# Patient Record
Sex: Female | Born: 1955 | Race: White | Hispanic: No | Marital: Married | Smoking: Former smoker
Health system: Southern US, Community
[De-identification: ages and names within clinical notes are randomized; demographics above are authoritative.]

## PROBLEM LIST (undated history)

## (undated) DIAGNOSIS — E119 Type 2 diabetes mellitus without complications: Secondary | ICD-10-CM

## (undated) DIAGNOSIS — E079 Disorder of thyroid, unspecified: Secondary | ICD-10-CM

## (undated) DIAGNOSIS — I639 Cerebral infarction, unspecified: Secondary | ICD-10-CM

## (undated) DIAGNOSIS — I10 Essential (primary) hypertension: Secondary | ICD-10-CM

## (undated) DIAGNOSIS — I2510 Atherosclerotic heart disease of native coronary artery without angina pectoris: Secondary | ICD-10-CM

## (undated) DIAGNOSIS — N289 Disorder of kidney and ureter, unspecified: Secondary | ICD-10-CM

## (undated) DIAGNOSIS — I219 Acute myocardial infarction, unspecified: Secondary | ICD-10-CM

## (undated) DIAGNOSIS — E7800 Pure hypercholesterolemia, unspecified: Secondary | ICD-10-CM

## (undated) DIAGNOSIS — E039 Hypothyroidism, unspecified: Secondary | ICD-10-CM

## (undated) HISTORY — PX: CARPAL TUNNEL RELEASE: SHX101

## (undated) HISTORY — PX: CORONARY ANGIOPLASTY: SHX604

## (undated) HISTORY — PX: ABDOMINAL HYSTERECTOMY: SHX81

---
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
## 1998-01-20 ENCOUNTER — Inpatient Hospital Stay (HOSPITAL_COMMUNITY): Admission: RE | Admit: 1998-01-20 | Discharge: 1998-01-23 | Payer: Self-pay | Admitting: Obstetrics and Gynecology

## 1998-02-02 ENCOUNTER — Inpatient Hospital Stay (HOSPITAL_COMMUNITY): Admission: AD | Admit: 1998-02-02 | Discharge: 1998-02-07 | Payer: Self-pay | Admitting: Obstetrics and Gynecology

## 1998-02-03 ENCOUNTER — Encounter: Payer: Self-pay | Admitting: General Surgery

## 1999-07-27 ENCOUNTER — Encounter: Payer: Self-pay | Admitting: Family Medicine

## 2000-03-23 ENCOUNTER — Encounter: Payer: Self-pay | Admitting: Emergency Medicine

## 2000-03-23 ENCOUNTER — Emergency Department (HOSPITAL_COMMUNITY): Admission: EM | Admit: 2000-03-23 | Discharge: 2000-03-23 | Payer: Self-pay | Admitting: Emergency Medicine

## 2000-03-28 ENCOUNTER — Encounter: Payer: Self-pay | Admitting: Family Medicine

## 2000-03-28 ENCOUNTER — Encounter: Admission: RE | Admit: 2000-03-28 | Discharge: 2000-03-28 | Payer: Self-pay | Admitting: Family Medicine

## 2000-05-19 ENCOUNTER — Other Ambulatory Visit: Admission: RE | Admit: 2000-05-19 | Discharge: 2000-05-19 | Payer: Self-pay | Admitting: Family Medicine

## 2000-06-25 ENCOUNTER — Emergency Department (HOSPITAL_COMMUNITY): Admission: EM | Admit: 2000-06-25 | Discharge: 2000-06-26 | Payer: Self-pay | Admitting: Emergency Medicine

## 2000-06-25 ENCOUNTER — Encounter: Payer: Self-pay | Admitting: Emergency Medicine

## 2003-01-13 ENCOUNTER — Encounter: Admission: RE | Admit: 2003-01-13 | Discharge: 2003-04-13 | Payer: Self-pay | Admitting: *Deleted

## 2003-01-15 ENCOUNTER — Encounter: Admission: RE | Admit: 2003-01-15 | Discharge: 2003-01-15 | Payer: Self-pay | Admitting: *Deleted

## 2004-04-21 ENCOUNTER — Ambulatory Visit: Payer: Self-pay | Admitting: Family Medicine

## 2004-09-27 ENCOUNTER — Emergency Department (HOSPITAL_COMMUNITY): Admission: EM | Admit: 2004-09-27 | Discharge: 2004-09-28 | Payer: Self-pay | Admitting: Emergency Medicine

## 2004-10-01 ENCOUNTER — Ambulatory Visit: Payer: Self-pay | Admitting: Family Medicine

## 2004-10-11 ENCOUNTER — Encounter: Admission: RE | Admit: 2004-10-11 | Discharge: 2004-10-11 | Payer: Self-pay | Admitting: Family Medicine

## 2005-03-07 ENCOUNTER — Ambulatory Visit: Payer: Self-pay | Admitting: Family Medicine

## 2005-04-11 ENCOUNTER — Ambulatory Visit: Payer: Self-pay | Admitting: Family Medicine

## 2005-12-27 ENCOUNTER — Ambulatory Visit: Payer: Self-pay | Admitting: Family Medicine

## 2005-12-28 ENCOUNTER — Encounter: Payer: Self-pay | Admitting: Family Medicine

## 2005-12-28 LAB — CONVERTED CEMR LAB: Hgb A1c MFr Bld: 8.8 %

## 2006-01-10 ENCOUNTER — Ambulatory Visit: Payer: Self-pay | Admitting: Family Medicine

## 2006-01-24 ENCOUNTER — Ambulatory Visit: Payer: Self-pay | Admitting: Family Medicine

## 2006-02-16 ENCOUNTER — Ambulatory Visit: Payer: Self-pay | Admitting: Family Medicine

## 2006-09-21 ENCOUNTER — Encounter: Payer: Self-pay | Admitting: Family Medicine

## 2006-09-21 DIAGNOSIS — F411 Generalized anxiety disorder: Secondary | ICD-10-CM | POA: Insufficient documentation

## 2006-09-21 DIAGNOSIS — E113512 Type 2 diabetes mellitus with proliferative diabetic retinopathy with macular edema, left eye: Secondary | ICD-10-CM | POA: Insufficient documentation

## 2006-09-21 DIAGNOSIS — F419 Anxiety disorder, unspecified: Secondary | ICD-10-CM | POA: Insufficient documentation

## 2006-09-21 DIAGNOSIS — Z87898 Personal history of other specified conditions: Secondary | ICD-10-CM | POA: Insufficient documentation

## 2006-09-21 DIAGNOSIS — J449 Chronic obstructive pulmonary disease, unspecified: Secondary | ICD-10-CM | POA: Insufficient documentation

## 2006-09-21 DIAGNOSIS — R609 Edema, unspecified: Secondary | ICD-10-CM | POA: Insufficient documentation

## 2006-09-21 DIAGNOSIS — F4323 Adjustment disorder with mixed anxiety and depressed mood: Secondary | ICD-10-CM | POA: Insufficient documentation

## 2006-09-21 DIAGNOSIS — E11319 Type 2 diabetes mellitus with unspecified diabetic retinopathy without macular edema: Secondary | ICD-10-CM

## 2006-09-21 DIAGNOSIS — G5600 Carpal tunnel syndrome, unspecified upper limb: Secondary | ICD-10-CM | POA: Insufficient documentation

## 2006-09-21 DIAGNOSIS — E1142 Type 2 diabetes mellitus with diabetic polyneuropathy: Secondary | ICD-10-CM | POA: Insufficient documentation

## 2006-09-21 DIAGNOSIS — E669 Obesity, unspecified: Secondary | ICD-10-CM | POA: Insufficient documentation

## 2006-09-21 DIAGNOSIS — Z794 Long term (current) use of insulin: Secondary | ICD-10-CM

## 2006-09-21 DIAGNOSIS — F17200 Nicotine dependence, unspecified, uncomplicated: Secondary | ICD-10-CM | POA: Insufficient documentation

## 2006-10-03 ENCOUNTER — Ambulatory Visit: Payer: Self-pay | Admitting: Family Medicine

## 2006-10-03 LAB — CONVERTED CEMR LAB
ALT: 15 units/L (ref 0–35)
CO2: 27 meq/L (ref 19–32)
Calcium: 9.4 mg/dL (ref 8.4–10.5)
Phosphorus: 4.2 mg/dL (ref 2.3–4.6)
Potassium: 4.9 meq/L (ref 3.5–5.3)
Sodium: 143 meq/L (ref 135–145)
TSH: 4.565 microintl units/mL (ref 0.350–5.50)

## 2006-10-19 ENCOUNTER — Encounter: Payer: Self-pay | Admitting: Family Medicine

## 2006-11-07 ENCOUNTER — Encounter (INDEPENDENT_AMBULATORY_CARE_PROVIDER_SITE_OTHER): Payer: Self-pay | Admitting: *Deleted

## 2007-01-16 ENCOUNTER — Ambulatory Visit: Payer: Self-pay

## 2007-03-19 ENCOUNTER — Encounter: Admission: RE | Admit: 2007-03-19 | Discharge: 2007-03-19 | Payer: Self-pay | Admitting: Orthopedic Surgery

## 2007-04-10 ENCOUNTER — Encounter: Admission: RE | Admit: 2007-04-10 | Discharge: 2007-04-10 | Payer: Self-pay | Admitting: Orthopedic Surgery

## 2007-09-18 ENCOUNTER — Encounter (INDEPENDENT_AMBULATORY_CARE_PROVIDER_SITE_OTHER): Payer: Self-pay | Admitting: *Deleted

## 2007-10-12 ENCOUNTER — Ambulatory Visit: Payer: Self-pay | Admitting: Family Medicine

## 2007-10-14 LAB — CONVERTED CEMR LAB
AST: 14 units/L (ref 0–37)
Alkaline Phosphatase: 75 units/L (ref 39–117)
Bilirubin, Direct: 0.1 mg/dL (ref 0.0–0.3)
Indirect Bilirubin: 0.3 mg/dL (ref 0.0–0.9)
Total Bilirubin: 0.4 mg/dL (ref 0.3–1.2)

## 2007-10-15 LAB — CONVERTED CEMR LAB
BUN: 12 mg/dL (ref 6–23)
CO2: 27 meq/L (ref 19–32)
Calcium: 9.1 mg/dL (ref 8.4–10.5)
Chloride: 100 meq/L (ref 96–112)
Eosinophils Absolute: 0.1 10*3/uL (ref 0.0–0.7)
Eosinophils Relative: 2 % (ref 0–5)
Glucose, Bld: 288 mg/dL — ABNORMAL HIGH (ref 70–99)
Hgb A1c MFr Bld: 10.8 % — ABNORMAL HIGH (ref 4.6–6.1)
Lymphocytes Relative: 24 % (ref 12–46)
Lymphs Abs: 1.7 10*3/uL (ref 0.7–4.0)
MCHC: 31.7 g/dL (ref 30.0–36.0)
MCV: 86.1 fL (ref 78.0–100.0)
Monocytes Relative: 6 % (ref 3–12)
Neutrophils Relative %: 68 % (ref 43–77)
Phosphorus: 3.4 mg/dL (ref 2.3–4.6)
Platelets: 244 10*3/uL (ref 150–400)
Potassium: 3.7 meq/L (ref 3.5–5.3)
RBC: 5.09 M/uL (ref 3.87–5.11)
RDW: 14.3 % (ref 11.5–15.5)
Sodium: 138 meq/L (ref 135–145)
Triglycerides: 321 mg/dL — ABNORMAL HIGH (ref <150)
VLDL: 64 mg/dL — ABNORMAL HIGH (ref 0–40)
WBC: 7.2 10*3/uL (ref 4.0–10.5)

## 2007-10-17 ENCOUNTER — Encounter: Payer: Self-pay | Admitting: Family Medicine

## 2007-10-26 ENCOUNTER — Telehealth: Payer: Self-pay | Admitting: Family Medicine

## 2007-11-06 ENCOUNTER — Ambulatory Visit: Payer: Self-pay | Admitting: Professional

## 2007-11-13 ENCOUNTER — Ambulatory Visit: Payer: Self-pay | Admitting: Family Medicine

## 2007-11-13 DIAGNOSIS — I10 Essential (primary) hypertension: Secondary | ICD-10-CM | POA: Insufficient documentation

## 2008-05-27 ENCOUNTER — Encounter: Payer: Self-pay | Admitting: Family Medicine

## 2008-07-25 ENCOUNTER — Telehealth: Payer: Self-pay | Admitting: Family Medicine

## 2008-07-30 ENCOUNTER — Ambulatory Visit: Payer: Self-pay | Admitting: Family Medicine

## 2008-08-01 ENCOUNTER — Encounter (INDEPENDENT_AMBULATORY_CARE_PROVIDER_SITE_OTHER): Payer: Self-pay | Admitting: Internal Medicine

## 2008-08-01 LAB — CONVERTED CEMR LAB
AST: 15 units/L (ref 0–37)
Cholesterol: 233 mg/dL (ref 0–200)
Creatinine, Ser: 0.8 mg/dL (ref 0.4–1.2)
Direct LDL: 159.5 mg/dL
Glucose, Bld: 151 mg/dL — ABNORMAL HIGH (ref 70–99)
HDL: 41 mg/dL (ref >39.0)
Hgb A1c MFr Bld: 8.1 % — ABNORMAL HIGH (ref 4.6–6.0)
Microalb Creat Ratio: 36.5 mg/g — ABNORMAL HIGH (ref 0.0–30.0)
Microalb, Ur: 1.5 mg/dL (ref 0.0–1.9)
Sodium: 139 meq/L (ref 135–145)
TSH: 4.94 microintl units/mL (ref 0.35–5.50)
Triglycerides: 121 mg/dL (ref 0–149)

## 2008-08-28 ENCOUNTER — Ambulatory Visit: Payer: Self-pay | Admitting: Family Medicine

## 2008-08-28 DIAGNOSIS — E785 Hyperlipidemia, unspecified: Secondary | ICD-10-CM | POA: Insufficient documentation

## 2008-08-28 DIAGNOSIS — E1169 Type 2 diabetes mellitus with other specified complication: Secondary | ICD-10-CM | POA: Insufficient documentation

## 2008-08-29 LAB — CONVERTED CEMR LAB
AST: 13 units/L (ref 0–37)
BUN: 12 mg/dL (ref 6–23)
CO2: 31 meq/L (ref 19–32)
Calcium: 9 mg/dL (ref 8.4–10.5)
Creatinine, Ser: 0.8 mg/dL (ref 0.4–1.2)
Direct LDL: 114.8 mg/dL
Glucose, Bld: 189 mg/dL — ABNORMAL HIGH (ref 70–99)
Hgb A1c MFr Bld: 8.2 % — ABNORMAL HIGH (ref 4.6–6.5)
Potassium: 4.4 meq/L (ref 3.5–5.1)
Sodium: 140 meq/L (ref 135–145)
VLDL: 40.4 mg/dL — ABNORMAL HIGH (ref 0.0–40.0)

## 2008-10-28 ENCOUNTER — Ambulatory Visit: Payer: Self-pay | Admitting: Family Medicine

## 2008-10-28 ENCOUNTER — Telehealth: Payer: Self-pay | Admitting: Family Medicine

## 2008-10-30 ENCOUNTER — Encounter (INDEPENDENT_AMBULATORY_CARE_PROVIDER_SITE_OTHER): Payer: Self-pay | Admitting: Internal Medicine

## 2008-10-30 LAB — CONVERTED CEMR LAB
AST: 16 units/L (ref 0–37)
Cholesterol: 164 mg/dL (ref 0–200)
HDL: 38.9 mg/dL — ABNORMAL LOW (ref >39.00)
Total CHOL/HDL Ratio: 4
VLDL: 28.2 mg/dL (ref 0.0–40.0)

## 2008-12-24 ENCOUNTER — Telehealth: Payer: Self-pay | Admitting: Family Medicine

## 2009-01-26 ENCOUNTER — Ambulatory Visit: Payer: Self-pay | Admitting: Family Medicine

## 2009-01-27 LAB — CONVERTED CEMR LAB
AST: 15 units/L (ref 0–37)
BUN: 8 mg/dL (ref 6–23)
Chloride: 105 meq/L (ref 96–112)
HDL: 41.9 mg/dL (ref >39.00)
Hgb A1c MFr Bld: 12.5 % — ABNORMAL HIGH (ref 4.6–6.5)
LDL Cholesterol: 102 mg/dL — ABNORMAL HIGH (ref 0–99)
Phosphorus: 3.8 mg/dL (ref 2.3–4.6)
Sodium: 141 meq/L (ref 135–145)
VLDL: 26 mg/dL (ref 0.0–40.0)

## 2009-03-04 ENCOUNTER — Encounter: Payer: Self-pay | Admitting: Family Medicine

## 2009-05-17 ENCOUNTER — Emergency Department (HOSPITAL_COMMUNITY): Admission: EM | Admit: 2009-05-17 | Discharge: 2009-05-17 | Payer: Self-pay | Admitting: Emergency Medicine

## 2009-05-22 ENCOUNTER — Ambulatory Visit: Payer: Self-pay | Admitting: Family Medicine

## 2009-06-03 ENCOUNTER — Telehealth: Payer: Self-pay | Admitting: Family Medicine

## 2009-07-27 ENCOUNTER — Encounter: Payer: Self-pay | Admitting: Family Medicine

## 2009-11-05 ENCOUNTER — Ambulatory Visit: Payer: Self-pay | Admitting: Family Medicine

## 2009-11-05 LAB — CONVERTED CEMR LAB: Rapid Strep: NEGATIVE

## 2009-11-06 ENCOUNTER — Emergency Department (HOSPITAL_COMMUNITY): Admission: EM | Admit: 2009-11-06 | Discharge: 2009-11-06 | Payer: Self-pay | Admitting: Emergency Medicine

## 2009-11-12 ENCOUNTER — Telehealth: Payer: Self-pay | Admitting: Family Medicine

## 2010-01-21 ENCOUNTER — Ambulatory Visit: Payer: Self-pay | Admitting: Family Medicine

## 2010-02-16 ENCOUNTER — Telehealth: Payer: Self-pay | Admitting: Family Medicine

## 2010-04-05 ENCOUNTER — Telehealth: Payer: Self-pay | Admitting: Family Medicine

## 2010-07-06 ENCOUNTER — Other Ambulatory Visit: Payer: Self-pay | Admitting: Family Medicine

## 2010-07-06 ENCOUNTER — Ambulatory Visit
Admission: RE | Admit: 2010-07-06 | Discharge: 2010-07-06 | Payer: Self-pay | Source: Home / Self Care | Attending: Family Medicine | Admitting: Family Medicine

## 2010-07-06 DIAGNOSIS — N63 Unspecified lump in unspecified breast: Secondary | ICD-10-CM | POA: Insufficient documentation

## 2010-07-06 DIAGNOSIS — E039 Hypothyroidism, unspecified: Secondary | ICD-10-CM | POA: Insufficient documentation

## 2010-07-06 DIAGNOSIS — N6310 Unspecified lump in the right breast, unspecified quadrant: Secondary | ICD-10-CM

## 2010-07-06 LAB — CBC WITH DIFFERENTIAL/PLATELET
Basophils Absolute: 0 10*3/uL (ref 0.0–0.1)
Basophils Relative: 0.3 % (ref 0.0–3.0)
Eosinophils Absolute: 0.1 10*3/uL (ref 0.0–0.7)
HCT: 43 % (ref 36.0–46.0)
Lymphocytes Relative: 25.9 % (ref 12.0–46.0)
Lymphs Abs: 2.8 10*3/uL (ref 0.7–4.0)
Monocytes Absolute: 0.6 10*3/uL (ref 0.1–1.0)
Monocytes Relative: 5.4 % (ref 3.0–12.0)
Neutro Abs: 7.2 10*3/uL (ref 1.4–7.7)
RDW: 14.5 % (ref 11.5–14.6)
WBC: 10.7 10*3/uL — ABNORMAL HIGH (ref 4.5–10.5)

## 2010-07-06 LAB — HEPATIC FUNCTION PANEL
AST: 16 U/L (ref 0–37)
Albumin: 3.9 g/dL (ref 3.5–5.2)
Bilirubin, Direct: 0.1 mg/dL (ref 0.0–0.3)
Total Bilirubin: 0.7 mg/dL (ref 0.3–1.2)

## 2010-07-06 LAB — RENAL FUNCTION PANEL
Albumin: 3.9 g/dL (ref 3.5–5.2)
BUN: 12 mg/dL (ref 6–23)
Calcium: 9.6 mg/dL (ref 8.4–10.5)
Glucose, Bld: 149 mg/dL — ABNORMAL HIGH (ref 70–99)
Phosphorus: 4.2 mg/dL (ref 2.3–4.6)
Sodium: 140 mEq/L (ref 135–145)

## 2010-07-06 LAB — LIPID PANEL
Cholesterol: 212 mg/dL — ABNORMAL HIGH (ref 0–200)
Total CHOL/HDL Ratio: 4
VLDL: 22.6 mg/dL (ref 0.0–40.0)

## 2010-07-06 LAB — TSH: TSH: 3.75 u[IU]/mL (ref 0.35–5.50)

## 2010-07-13 ENCOUNTER — Encounter (INDEPENDENT_AMBULATORY_CARE_PROVIDER_SITE_OTHER): Payer: Self-pay | Admitting: *Deleted

## 2010-07-13 NOTE — Progress Notes (Signed)
Summary: Bupropion Refill  Phone Note Refill Request Call back at [place] from:  [facility] on February 16, 2010 4:46 PM  Refills Requested: Medication #1:  BUDEPRION SR 150 MG TB12 1 by mouth two times a day   Last Refilled: 01/09/2010 OK to refill 1 by mouth two times a day #60 with 3 refills?   Method Requested: Electronic Initial call taken by: Arnetha Courser CMA Deborra Medina),  February 16, 2010 4:47 PM  Follow-up for Phone Call        px written on EMR for call in  Follow-up by: Allena Earing MD,  February 16, 2010 5:09 PM  Additional Follow-up for Phone Call Additional follow up Details #1::        Medication phoned [facility] as instructed. Ozzie Hoyle LPN  September  6, 624THL 5:15 PM     New/Updated Medications: BUDEPRION SR 150 MG TB12 (BUPROPION HCL) 1 by mouth two times a day Prescriptions: BUDEPRION SR 150 MG TB12 (BUPROPION HCL) 1 by mouth two times a day  #60 x 5   Entered and Authorized by:   Allena Earing MD   Signed by:   Ozzie Hoyle LPN on 624THL   Method used:   Telephoned to ...       [facility] ([facility])       [address] [place], [place]  [address]       Ph: [phone]       Fax: [phone]   RxID:   [id number]
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
## 2010-07-13 NOTE — Progress Notes (Signed)
Summary: Cymbalta 60mg  and Furosemide 20mg   Phone Note Refill Request Call back at [phone] Message from:  [facility] on April 05, 2010 11:19 AM  Refills Requested: Medication #1:  CYMBALTA 60 MG CPEP 1 once daily for depression   Last Refilled: 02/15/2010  Medication #2:  LASIX 20 MG  TABS 2 by mouth daily   Last Refilled: 02/15/2010 [facility] electronically request refill on Cymbalta 60mg  and Furosemide 20mg . Last refill date on both was 02/15/10. does pt need f/u appt for HTN?Please advise.    Method Requested: Electronic Initial call taken by: Ozzie Hoyle LPN,  October 24, 624THL 11:20 AM  Follow-up for Phone Call        please sched f/u for DM and HTN - is overdue px written on EMR for call in  Follow-up by: Allena Earing MD,  April 05, 2010 11:26 AM  Additional Follow-up for Phone Call Additional follow up Details #1::        Patient Advised.  She will schedule appt. once she correlates it with her work schedule. Additional Follow-up by: Christena Deem CMA Deborra Medina),  April 05, 2010 12:34 PM    New/Updated Medications: LASIX 20 MG  TABS (FUROSEMIDE) 2 by mouth once daily CYMBALTA 60 MG CPEP (DULOXETINE HCL) 1 once daily for depression Prescriptions: LASIX 20 MG  TABS (FUROSEMIDE) 2 by mouth once daily  #60 x 3   Entered and Authorized by:   Allena Earing MD   Signed by:   Christena Deem CMA ([place]) on 04/05/2010   Method used:   Telephoned to ...       [facility]       [place], [place]  [address]       Ph: [phone]       Fax: [phone]   RxID:   [id number] CYMBALTA 60 MG CPEP (DULOXETINE HCL) 1 once daily for depression  #30 x 11   Entered and Authorized by:   Allena Earing MD   Signed by:   Christena Deem CMA ([place]) on 04/05/2010   Method used:   Telephoned to ...       [facility]       [address] [place], [place]  [address]       Ph:  [phone]       Fax: [phone]   RxID:   [id number]
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
## 2010-07-13 NOTE — Progress Notes (Signed)
Summary: requests diflucan  Phone Note Call from Patient Call back at Home Phone [phone]   Caller: Patient Call For: Allena Earing MD Summary of Call: Pt has recently been on antibiotics and now she has a yeast infection- itching and discharge.  She is asking for diflucan, [facility] [place]. Initial call taken by: Marty Heck CMA,  November 12, 2009 2:04 PM  Follow-up for Phone Call        px written on EMR for call in  Follow-up by: Allena Earing MD,  November 12, 2009 3:22 PM  Additional Follow-up for Phone Call Additional follow up Details #1::        Medication phoned to [facility] as instructed. Patient notified as instructed by telephone. Ozzie Hoyle LPN  June  2, 624THL 624THL PM     New/Updated Medications: DIFLUCAN 150 MG TABS (FLUCONAZOLE) 1 by mouth times one for yeast Prescriptions: DIFLUCAN 150 MG TABS (FLUCONAZOLE) 1 by mouth times one for yeast  #1 x 0   Entered and Authorized by:   Allena Earing MD   Signed by:   Ozzie Hoyle LPN on 075-GRM   Method used:   Telephoned to ...       [facility]       [address] [place], [place]  [address]       Ph: [phone]       Fax: [phone]   RxID:   [id number]

## 2010-07-14 ENCOUNTER — Encounter (HOSPITAL_COMMUNITY): Payer: Self-pay

## 2010-07-14 ENCOUNTER — Ambulatory Visit (HOSPITAL_COMMUNITY)
Admission: RE | Admit: 2010-07-14 | Discharge: 2010-07-14 | Disposition: A | Discharge status: Home / Self Care | Payer: 59 | Source: Ambulatory Visit | Attending: Family Medicine | Admitting: Family Medicine

## 2010-07-14 ENCOUNTER — Encounter: Payer: Self-pay | Admitting: Family Medicine

## 2010-07-14 DIAGNOSIS — N63 Unspecified lump in unspecified breast: Secondary | ICD-10-CM | POA: Insufficient documentation

## 2010-07-14 DIAGNOSIS — N6009 Solitary cyst of unspecified breast: Secondary | ICD-10-CM | POA: Insufficient documentation

## 2010-07-14 DIAGNOSIS — N6310 Unspecified lump in the right breast, unspecified quadrant: Secondary | ICD-10-CM
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
## 2010-07-16 NOTE — Letter (Signed)
Summary: [facility]   Imported By: Edmonia James 08/12/2009 11:08:44  _____________________________________________________________________  External Attachment:    Type:   Image     Comment:   External Document

## 2010-07-21 NOTE — Miscellaneous (Signed)
Summary: accucheck test strips  Medications Added ACCU-CHEK AVIVA  STRP (GLUCOSE BLOOD) check blood glucose twice daily and as needed for poor diabetes control       Clinical Lists Changes  Medications: Changed medication from * DIABETES TEST STRIPS - ACCUCHECK, AND LANCETS to check glucose two times a day and as needed for poorly controlled DM 250.0 to ACCU-CHEK AVIVA  STRP (GLUCOSE BLOOD) check blood glucose twice daily and as needed for poor diabetes control     Prior Medications: LASIX 20 MG  TABS (FUROSEMIDE) 2 by mouth once daily BUDEPRION SR 150 MG TB12 (BUPROPION HCL) 1 by mouth two times a day LISINOPRIL 5 MG  TABS (LISINOPRIL) 1 by mouth every am CYMBALTA 60 MG CPEP (DULOXETINE HCL) 1 once daily for depression ACCU-CHEK AVIVA  STRP (GLUCOSE BLOOD) check blood glucose twice daily and as needed for poor diabetes control ZOCOR 80 MG TABS (SIMVASTATIN) 1 by mouth once daily LEVOTHYROXINE SODIUM 50 MCG TABS (LEVOTHYROXINE SODIUM) Take 1 tablet by mouth once a day HUMULIN 70/30 70-30 % SUSP (INSULIN ISOPHANE & REGULAR) Inject 60 units before breakfast and 60 units before bedtime Current Allergies: No known allergies

## 2010-08-16 ENCOUNTER — Other Ambulatory Visit (INDEPENDENT_AMBULATORY_CARE_PROVIDER_SITE_OTHER): Payer: 59

## 2010-08-16 ENCOUNTER — Other Ambulatory Visit: Payer: Self-pay | Admitting: Family Medicine

## 2010-08-16 ENCOUNTER — Encounter (INDEPENDENT_AMBULATORY_CARE_PROVIDER_SITE_OTHER): Payer: Self-pay | Admitting: *Deleted

## 2010-08-16 DIAGNOSIS — D72829 Elevated white blood cell count, unspecified: Secondary | ICD-10-CM | POA: Insufficient documentation

## 2010-08-16 DIAGNOSIS — E785 Hyperlipidemia, unspecified: Secondary | ICD-10-CM

## 2010-08-16 LAB — CBC WITH DIFFERENTIAL/PLATELET
Basophils Absolute: 0 10*3/uL (ref 0.0–0.1)
Basophils Relative: 0.3 % (ref 0.0–3.0)
Eosinophils Absolute: 0.1 10*3/uL (ref 0.0–0.7)
Eosinophils Relative: 1 % (ref 0.0–5.0)
Lymphs Abs: 2.5 10*3/uL (ref 0.7–4.0)
MCV: 83.8 fl (ref 78.0–100.0)
Monocytes Relative: 5.7 % (ref 3.0–12.0)
Neutro Abs: 7 10*3/uL (ref 1.4–7.7)
RBC: 4.85 Mil/uL (ref 3.87–5.11)
WBC: 10.2 10*3/uL (ref 4.5–10.5)

## 2010-08-16 LAB — AST: AST: 13 U/L (ref 0–37)

## 2010-08-16 LAB — LIPID PANEL
Total CHOL/HDL Ratio: 5
Triglycerides: 222 mg/dL — ABNORMAL HIGH (ref 0.0–149.0)
VLDL: 44.4 mg/dL — ABNORMAL HIGH (ref 0.0–40.0)

## 2010-08-16 LAB — LDL CHOLESTEROL, DIRECT: Direct LDL: 119.1 mg/dL

## 2010-10-11 ENCOUNTER — Telehealth: Payer: Self-pay | Admitting: *Deleted

## 2010-10-11 MED ORDER — FLUCONAZOLE 150 MG PO TABS: 150.0000 mg | ORAL_TABLET | Freq: Once | ORAL | Status: AC

## 2010-10-11 NOTE — Telephone Encounter (Signed)
Pt is complaining of yeast infection- has itching, burning, discharge.  Would like diflucan sent to [facility] in [place].

## 2010-10-11 NOTE — Telephone Encounter (Signed)
Px written for call in  F/u if not improved

## 2010-10-11 NOTE — Telephone Encounter (Signed)
Patient notified as instructed by telephone. Medication phoned to [place] pharmacy as instructed.

## 2010-11-20 ENCOUNTER — Other Ambulatory Visit: Payer: Self-pay | Admitting: Family Medicine

## 2010-11-21 NOTE — Telephone Encounter (Signed)
Px written for call in   

## 2010-11-22 NOTE — Telephone Encounter (Signed)
Rx called to [facility]. 

## 2010-12-31 ENCOUNTER — Other Ambulatory Visit: Payer: Self-pay | Admitting: Family Medicine

## 2010-12-31 NOTE — Telephone Encounter (Signed)
[facility] [place] electrnically request Lisinopril 5mg  #30 given x0 pt needs to call for appt.

## 2011-02-12 ENCOUNTER — Other Ambulatory Visit: Payer: Self-pay | Admitting: Family Medicine

## 2011-03-21 ENCOUNTER — Telehealth: Payer: Self-pay | Admitting: *Deleted

## 2011-03-21 MED ORDER — FLUCONAZOLE 150 MG PO TABS: 150.0000 mg | ORAL_TABLET | Freq: Once | ORAL | Status: AC

## 2011-03-21 NOTE — Telephone Encounter (Signed)
Pt states she has a yeast infection- has itching and burning, and is requesting diflucan.  She had taken prednisone for a cough, prednisone runs her blood sugar up, that gave her a yeast infection.  Uses [facility] in [place].

## 2011-03-21 NOTE — Telephone Encounter (Signed)
Will refill electronically F/u if not improved within a week

## 2011-03-21 NOTE — Telephone Encounter (Signed)
Patient notified as instructed by telephone. 

## 2011-04-09 ENCOUNTER — Other Ambulatory Visit: Payer: Self-pay | Admitting: Family Medicine

## 2011-04-11 NOTE — Telephone Encounter (Signed)
Will refill electronically  

## 2011-05-26 ENCOUNTER — Other Ambulatory Visit: Payer: Self-pay | Admitting: Family Medicine

## 2011-05-27 NOTE — Telephone Encounter (Signed)
Med did not flow over from [facility] I think Will refill electronically sched appt with me this winter please

## 2011-05-27 NOTE — Telephone Encounter (Signed)
OK to refill? Not on current med list and has not been seen since 1/12.

## 2011-06-09 ENCOUNTER — Other Ambulatory Visit: Payer: Self-pay | Admitting: Family Medicine

## 2011-10-28 ENCOUNTER — Other Ambulatory Visit: Payer: Self-pay | Admitting: Family Medicine

## 2011-10-28 NOTE — Telephone Encounter (Signed)
Patient advised as instructed via telephone, appt scheduled for 11/04/2011 at 9:15 with Dr. Glori Bickers.

## 2011-10-28 NOTE — Telephone Encounter (Signed)
Patient hasn't been seen in some time.  Please advise.

## 2011-10-28 NOTE — Telephone Encounter (Signed)
Make appt please Will refill electronically 1 mo

## 2011-11-04 ENCOUNTER — Encounter: Payer: Self-pay | Admitting: Family Medicine

## 2011-11-04 ENCOUNTER — Ambulatory Visit (INDEPENDENT_AMBULATORY_CARE_PROVIDER_SITE_OTHER): Payer: 59 | Admitting: Family Medicine

## 2011-11-04 VITALS — BP 120/62 | HR 76 | Temp 97.9°F | Ht 67.0 in | Wt 262.2 lb

## 2011-11-04 DIAGNOSIS — F17200 Nicotine dependence, unspecified, uncomplicated: Secondary | ICD-10-CM

## 2011-11-04 DIAGNOSIS — E119 Type 2 diabetes mellitus without complications: Secondary | ICD-10-CM

## 2011-11-04 DIAGNOSIS — Z23 Encounter for immunization: Secondary | ICD-10-CM

## 2011-11-04 DIAGNOSIS — E669 Obesity, unspecified: Secondary | ICD-10-CM

## 2011-11-04 DIAGNOSIS — B373 Candidiasis of vulva and vagina: Secondary | ICD-10-CM

## 2011-11-04 DIAGNOSIS — E785 Hyperlipidemia, unspecified: Secondary | ICD-10-CM

## 2011-11-04 DIAGNOSIS — E039 Hypothyroidism, unspecified: Secondary | ICD-10-CM

## 2011-11-04 DIAGNOSIS — I10 Essential (primary) hypertension: Secondary | ICD-10-CM

## 2011-11-04 LAB — COMPREHENSIVE METABOLIC PANEL
AST: 14 U/L (ref 0–37)
Albumin: 3.1 g/dL — ABNORMAL LOW (ref 3.5–5.2)
BUN: 11 mg/dL (ref 6–23)
Calcium: 8.6 mg/dL (ref 8.4–10.5)
Chloride: 103 mEq/L (ref 96–112)
Glucose, Bld: 325 mg/dL — ABNORMAL HIGH (ref 70–99)
Potassium: 4.5 mEq/L (ref 3.5–5.1)
Sodium: 137 mEq/L (ref 135–145)
Total Protein: 6 g/dL (ref 6.0–8.3)

## 2011-11-04 LAB — LIPID PANEL
Total CHOL/HDL Ratio: 5
Triglycerides: 209 mg/dL — ABNORMAL HIGH (ref 0.0–149.0)

## 2011-11-04 LAB — TSH: TSH: 1.73 u[IU]/mL (ref 0.35–5.50)

## 2011-11-04 LAB — LDL CHOLESTEROL, DIRECT: Direct LDL: 154.2 mg/dL

## 2011-11-04 MED ORDER — FLUCONAZOLE 150 MG PO TABS: 150.0000 mg | ORAL_TABLET | Freq: Once | ORAL | Status: AC

## 2011-11-04 MED ORDER — LISINOPRIL 5 MG PO TABS: 5.0000 mg | ORAL_TABLET | Freq: Every day | ORAL | Status: DC

## 2011-11-04 MED ORDER — LEVOTHYROXINE SODIUM 200 MCG PO TABS: 200.0000 ug | ORAL_TABLET | Freq: Every day | ORAL | Status: DC

## 2011-11-04 MED ORDER — ATORVASTATIN CALCIUM 40 MG PO TABS: 40.0000 mg | ORAL_TABLET | Freq: Every day | ORAL | Status: DC

## 2011-11-04 MED ORDER — FUROSEMIDE 20 MG PO TABS: 20.0000 mg | ORAL_TABLET | Freq: Every day | ORAL | Status: DC

## 2011-11-04 NOTE — Assessment & Plan Note (Signed)
Discussed how this problem influences overall health and the risks it imposes  Reviewed plan for weight loss with lower calorie diet (via better food choices and also portion control or program like weight watchers) and exercise building up to or more than 30 minutes 5 days per week including some aerobic activity   pt is not motivated for change

## 2011-11-04 NOTE — Assessment & Plan Note (Signed)
bp in fair control at this time  No changes needed  Disc lifstyle change with low sodium diet and exercise   Lab today meds refilled

## 2011-11-04 NOTE — Assessment & Plan Note (Signed)
Disc in detail risks of smoking and possible outcomes including copd, vascular/ heart disease, cancer , respiratory and sinus infections  Pt voices understanding  She is not ready to quit Pneumovax today

## 2011-11-04 NOTE — Assessment & Plan Note (Signed)
Changed zocor high dose to lipitor 40 in light of current guidelines  Also aches and pains  Lab today and then will plan next lab

## 2011-11-04 NOTE — Patient Instructions (Signed)
Labs today Keep thinking about quitting smoking Tetanus and pneumonia vaccines today  Hold cholesterol medicine the day you take the diflucan Stop zocor (simvastatin) and start generic lipitor instead  Try to work on the diet and exercise as usual

## 2011-11-04 NOTE — Assessment & Plan Note (Signed)
Diflucan times one - no imp with monistat(pt recently had abx)

## 2011-11-04 NOTE — Assessment & Plan Note (Signed)
Pt sees endocrine - using humulog and states she is doing better overall  Nl foot exam Update pneumovax and Tdap today

## 2011-11-04 NOTE — Assessment & Plan Note (Signed)
No clinical changes Lab today for tsh Med refilled

## 2011-11-04 NOTE — Progress Notes (Signed)
Subjective:    Patient ID: Jennifer [name], female    DOB: [birth year], 56 y.o.   MRN: [id number]  HPI Here for f/u of chronic conditions  Nothing new going on medically   Wt is up 2 lb with bmi of 41  bp is   Good   Today 120/62 No cp or palpitations or headaches or edema  No side effects to medicines  - currently on lasix and ace   Diabetes- sees endo  Sugars are mostly 100-200 - getting better than what they were  Is used to using the insulin now , uses humulog- no sliding scale   Hypothyroid  Lab Results  Component Value Date   TSH 3.75 07/06/2010    Does not feel any different  Her endocrinologist checked this at last visit and it was the same  Does watch diet for DM at times -- does the best she can   Wt is up 2 lb with bmi of 41  Smoking status- is about the same  1/3 ppd  No chance of quitting with smokers all around her at work  2 weeks ago had a bout of bronchitis- is fine now  No ongoing breathing issues   Cholesterol - is on zocor 80 and diet Does have a fair amt of aches and pains - but att this to arthritis  Not an exerciser  Lab Results  Component Value Date   CHOL 191 08/16/2010   HDL 41.40 08/16/2010   LDLCALC 102* 01/26/2009   LDLDIRECT 119.1 08/16/2010   TRIG 222.0* 08/16/2010   CHOLHDL 5 08/16/2010    Patient Active Problem List  Diagnoses  . DIABETES MELLITUS, TYPE II  . DIABETIC PERIPHERAL NEUROPATHY  . HYPERLIPIDEMIA  . OBESITY  . ANXIETY  . TOBACCO ABUSE  . DEPRESSION  . CARPAL TUNNEL SYNDROME  . ESSENTIAL HYPERTENSION  . COPD  . EDEMA  . MIGRAINES, HX OF  . HYPOTHYROIDISM  . BREAST MASS, RIGHT  . LEUKOCYTOSIS  . Yeast vaginitis   No past medical history on file. No past surgical history on file. History  Substance Use Topics  . Smoking status: Current Everyday Smoker  . Smokeless tobacco: Not on file  . Alcohol Use: Not on file   No family history on file. No Known Allergies Current Outpatient Prescriptions on File Prior to Visit    Medication Sig Dispense Refill  . buPROPion (WELLBUTRIN SR) 150 MG 12 hr tablet TAKE ONE TABLET BY MOUTH TWICE DAILY  60 tablet  11  . CYMBALTA 60 MG capsule TAKE ONE CAPSULE BY MOUTH EVERY DAY FOR  DEPRESSION  30 each  11  . furosemide (LASIX) 20 MG tablet Take 1 tablet (20 mg total) by mouth daily.  30 tablet  11  . insulin lispro (HUMALOG) 100 UNIT/ML injection Inject 70 Units into the skin 2 (two) times daily.      Marland Kitchen levothyroxine (SYNTHROID, LEVOTHROID) 200 MCG tablet Take 1 tablet (200 mcg total) by mouth daily.  30 tablet  11  . lisinopril (PRINIVIL,ZESTRIL) 5 MG tablet Take 1 tablet (5 mg total) by mouth daily.  30 tablet  11  . atorvastatin (LIPITOR) 40 MG tablet Take 1 tablet (40 mg total) by mouth daily.  30 tablet  11      Review of Systems Review of Systems  Constitutional: Negative for fever, appetite change, fatigue and unexpected weight change.  Eyes: Negative for pain and visual disturbance.  Respiratory: Negative for cough and shortness of breath.  Cardiovascular: Negative for cp or palpitations    Gastrointestinal: Negative for nausea, diarrhea and constipation.  Genitourinary: Negative for urgency and frequency.  Skin: Negative for pallor or rash   Neurological: Negative for weakness, light-headedness, numbness and headaches.  Hematological: Negative for adenopathy. Does not bruise/bleed easily.  Psychiatric/Behavioral: Negative for dysphoric mood. The patient is not nervous/anxious.         Objective:   Physical Exam  Constitutional: She appears well-developed and well-nourished. No distress.       Obese and well appearing   HENT:  Head: Normocephalic and atraumatic.  Mouth/Throat: Oropharynx is clear and moist.  Eyes: Conjunctivae and EOM are normal. Pupils are equal, round, and reactive to light. No scleral icterus.  Neck: Normal range of motion. Neck supple. No JVD present. No thyromegaly present.  Cardiovascular: Normal rate, regular rhythm, normal heart  sounds and intact distal pulses.  Exam reveals no gallop.   Pulmonary/Chest: Effort normal and breath sounds normal. No respiratory distress. She has no wheezes.  Abdominal: Soft. Bowel sounds are normal. She exhibits no distension and no mass. There is no tenderness.  Musculoskeletal: She exhibits no edema and no tenderness.  Lymphadenopathy:    She has no cervical adenopathy.  Neurological: She has normal reflexes. No cranial nerve deficit. She exhibits normal muscle tone. Coordination normal.  Skin: Skin is warm and dry. No rash noted. No erythema. No pallor.  Psychiatric: She has a normal mood and affect.          Assessment & Plan:

## 2011-11-08 ENCOUNTER — Telehealth: Payer: Self-pay | Admitting: Family Medicine

## 2011-11-08 MED ORDER — LEVOTHYROXINE SODIUM 50 MCG PO TABS: 50.0000 ug | ORAL_TABLET | Freq: Every day | ORAL | Status: AC

## 2011-11-08 NOTE — Telephone Encounter (Signed)
You are right - I will correct that - please let pharmacy know that was not the right dose and Px written for call in  For 50 mcg

## 2011-11-08 NOTE — Telephone Encounter (Signed)
Message copied by Abner Greenspan on Tue Nov 08, 2011  5:28 PM ------      Message from: Harmon Dun      Created: Tue Nov 08, 2011  9:28 AM       Patient advised as instructed via telephone, she stated that she has been taking 58mcg of Synthroid but her last refill was for 214mcg.  Please advise.  Copy of labs sent to Dr. Soyla Murphy.

## 2011-11-09 NOTE — Telephone Encounter (Signed)
Patient advised via message left on machine at home, called [facility] and left a message on pharmacy voicemail advising of the correct dose.

## 2011-12-30 ENCOUNTER — Other Ambulatory Visit: Payer: Self-pay | Admitting: Family Medicine

## 2012-01-02 ENCOUNTER — Other Ambulatory Visit: Payer: Self-pay

## 2012-01-02 MED ORDER — BUPROPION HCL ER (SR) 150 MG PO TB12: 150.0000 mg | ORAL_TABLET | Freq: Two times a day (BID) | ORAL | Status: DC

## 2012-05-19 ENCOUNTER — Other Ambulatory Visit: Payer: Self-pay | Admitting: Family Medicine

## 2012-05-21 NOTE — Telephone Encounter (Signed)
refil for 6 months please

## 2012-05-21 NOTE — Telephone Encounter (Signed)
Ok to refill 

## 2012-09-04 ENCOUNTER — Other Ambulatory Visit: Payer: Self-pay | Admitting: Emergency Medicine

## 2012-09-04 ENCOUNTER — Ambulatory Visit
Admission: RE | Admit: 2012-09-04 | Discharge: 2012-09-04 | Disposition: A | Discharge status: Home / Self Care | Payer: 59 | Source: Ambulatory Visit | Attending: Emergency Medicine | Admitting: Emergency Medicine

## 2012-12-07 ENCOUNTER — Other Ambulatory Visit: Payer: Self-pay | Admitting: Family Medicine

## 2012-12-10 NOTE — Telephone Encounter (Signed)
Electronic refill request, no recent/future appts., please advise  

## 2012-12-10 NOTE — Telephone Encounter (Signed)
Please schedule f/u and refill until then  

## 2012-12-11 NOTE — Telephone Encounter (Signed)
appt scheduled for 01/22/13 and med refilled

## 2012-12-13 ENCOUNTER — Other Ambulatory Visit: Payer: Self-pay | Admitting: *Deleted

## 2012-12-13 MED ORDER — LISINOPRIL 5 MG PO TABS: 5.0000 mg | ORAL_TABLET | Freq: Every day | ORAL | Status: DC

## 2013-01-22 ENCOUNTER — Ambulatory Visit: Payer: Self-pay | Admitting: Family Medicine

## 2013-01-29 ENCOUNTER — Other Ambulatory Visit: Payer: Self-pay | Admitting: Family Medicine

## 2013-01-29 NOTE — Telephone Encounter (Signed)
Please schedule a f/u (she could not make her last one) and refill until then

## 2013-01-29 NOTE — Telephone Encounter (Signed)
Fax refill request, please advise  

## 2013-01-29 NOTE — Telephone Encounter (Signed)
Left message with family requesting pt to call office

## 2013-02-01 ENCOUNTER — Encounter: Payer: Self-pay | Admitting: Family Medicine

## 2013-02-01 ENCOUNTER — Ambulatory Visit (INDEPENDENT_AMBULATORY_CARE_PROVIDER_SITE_OTHER): Payer: 59 | Admitting: Family Medicine

## 2013-02-01 VITALS — BP 128/70 | HR 92 | Temp 98.9°F | Ht 67.0 in | Wt 285.0 lb

## 2013-02-01 DIAGNOSIS — E039 Hypothyroidism, unspecified: Secondary | ICD-10-CM

## 2013-02-01 DIAGNOSIS — E785 Hyperlipidemia, unspecified: Secondary | ICD-10-CM

## 2013-02-01 DIAGNOSIS — F17200 Nicotine dependence, unspecified, uncomplicated: Secondary | ICD-10-CM

## 2013-02-01 DIAGNOSIS — E669 Obesity, unspecified: Secondary | ICD-10-CM

## 2013-02-01 DIAGNOSIS — I10 Essential (primary) hypertension: Secondary | ICD-10-CM

## 2013-02-01 MED ORDER — BUPROPION HCL ER (SR) 150 MG PO TB12: 150.0000 mg | ORAL_TABLET | Freq: Two times a day (BID) | ORAL | Status: DC

## 2013-02-01 MED ORDER — LISINOPRIL 5 MG PO TABS: 5.0000 mg | ORAL_TABLET | Freq: Every day | ORAL | Status: DC

## 2013-02-01 MED ORDER — ATORVASTATIN CALCIUM 40 MG PO TABS: 40.0000 mg | ORAL_TABLET | Freq: Every day | ORAL | Status: DC

## 2013-02-01 MED ORDER — FUROSEMIDE 20 MG PO TABS: 20.0000 mg | ORAL_TABLET | Freq: Every day | ORAL | Status: DC

## 2013-02-01 MED ORDER — DULOXETINE HCL 60 MG PO CPEP: 60.0000 mg | ORAL_CAPSULE | Freq: Every day | ORAL | Status: DC

## 2013-02-01 NOTE — Patient Instructions (Addendum)
Please send for last note and labs from endo- Dr Chalmers Cater  Don't forget to get a flu shot in the fall  Take care of yourself and when you are able-start some exercise  Work on weight loss  Think about quitting smoking  Don't forget to schedule your own mammogram at [facility]

## 2013-02-01 NOTE — Progress Notes (Signed)
Subjective:    Patient ID: Jennifer [name], female    DOB: 1955-06-19, 57 y.o.   MRN: [id number]  HPI Here for f/u of chronic health problems   Sees endo for DM Working on that - A1c is 8.7- getting better  Just on insulin right now   Smoking - no change - about 1/2 ppd  Not ready to quit yet-she works in it   bp is stable today  No cp or palpitations or headaches or edema  No side effects to medicines  BP Readings from Last 3 Encounters:  02/01/13 128/70  11/04/11 120/62  07/06/10 104/66     Hyperlipidemia Due for a check on lipitor 40 Endocrinologist did cholesterol - and the LDL 108- close to goal - that was just last month   Diet-good days and bad days -? Motivation Not a lot of exercise- she broke her foot -and that took her off track  Wt is up over 20lb   Hypothyroid- recently changed her thyroid med -dose went up from 50 to 80mcg    Mood - still taking wellbutrin- has not had it in 3 days- she can feel the difference- gets headaches if she does not take it also   Edema is about the same   Patient Active Problem List   Diagnosis Date Noted  . Yeast vaginitis 11/04/2011  . LEUKOCYTOSIS 08/16/2010  . HYPOTHYROIDISM 07/06/2010  . BREAST MASS, RIGHT 07/06/2010  . HYPERLIPIDEMIA 08/28/2008  . ESSENTIAL HYPERTENSION 11/13/2007  . DIABETES MELLITUS, TYPE II 09/21/2006  . DIABETIC PERIPHERAL NEUROPATHY 09/21/2006  . OBESITY 09/21/2006  . ANXIETY 09/21/2006  . TOBACCO ABUSE 09/21/2006  . DEPRESSION 09/21/2006  . CARPAL TUNNEL SYNDROME 09/21/2006  . COPD 09/21/2006  . EDEMA 09/21/2006  . MIGRAINES, HX OF 09/21/2006   No past medical history on file. No past surgical history on file. History  Substance Use Topics  . Smoking status: Current Every Day Smoker -- 0.50 packs/day    Types: Cigarettes  . Smokeless tobacco: Not on file  . Alcohol Use: No   No family history on file. No Known Allergies Current Outpatient Prescriptions on File Prior to Visit   Medication Sig Dispense Refill  . insulin lispro (HUMALOG) 100 UNIT/ML injection Inject 75 Units into the skin 2 (two) times daily.        No current facility-administered medications on file prior to visit.    Review of Systems Review of Systems  Constitutional: Negative for fever, appetite change, and unexpected weight change.  Eyes: Negative for pain and visual disturbance.  Respiratory: Negative for cough and shortness of breath.   Cardiovascular: Negative for cp or palpitations    Gastrointestinal: Negative for nausea, diarrhea and constipation.  Genitourinary: Negative for urgency and frequency.  Skin: Negative for pallor or rash   Neurological: Negative for weakness, light-headedness, numbness and headaches.  Hematological: Negative for adenopathy. Does not bruise/bleed easily.  Psychiatric/Behavioral: Negative for dysphoric mood. The patient is not nervous/anxious.         Objective:   Physical Exam  Constitutional: She appears well-developed and well-nourished. No distress.  obese and well appearing   HENT:  Head: Normocephalic and atraumatic.  Mouth/Throat: Oropharynx is clear and moist.  Eyes: Conjunctivae and EOM are normal. Pupils are equal, round, and reactive to light. Left eye exhibits no discharge. No scleral icterus.  Neck: Normal range of motion. Neck supple. No JVD present. Carotid bruit is not present. No thyromegaly present.  Cardiovascular: Normal rate, regular rhythm  and intact distal pulses.   Pulmonary/Chest: Effort normal and breath sounds normal. No respiratory distress. She has no wheezes.  Abdominal: Soft. Bowel sounds are normal. She exhibits no distension, no abdominal bruit and no mass. There is no tenderness.  Musculoskeletal: She exhibits no edema and no tenderness.  Lymphadenopathy:    She has no cervical adenopathy.  Neurological: She is alert. She has normal reflexes. No cranial nerve deficit. She exhibits normal muscle tone. Coordination  normal.  Skin: Skin is warm and dry. No rash noted. No erythema. No pallor.  Psychiatric: She has a normal mood and affect.          Assessment & Plan:

## 2013-02-03 NOTE — Assessment & Plan Note (Signed)
bp in fair control at this time  No changes needed  Disc lifstyle change with low sodium diet and exercise  Will rev labs from Dr Chalmers Cater

## 2013-02-03 NOTE — Assessment & Plan Note (Signed)
Disc in detail risks of smoking and possible outcomes including copd, vascular/ heart disease, cancer , respiratory and sinus infections  Pt voices understanding Pt is not yet ready to quit  

## 2013-02-03 NOTE — Assessment & Plan Note (Signed)
Per pt -tsh is therapeutic with recent change of med dose Per Dr Chalmers Cater No symptoms

## 2013-02-03 NOTE — Assessment & Plan Note (Signed)
Labs per Dr Casilda Carls for those Disc goals for lipids and reasons to control them Rev low sat fat diet in detail

## 2013-02-03 NOTE — Assessment & Plan Note (Signed)
Discussed how this problem influences overall health and the risks it imposes  Reviewed plan for weight loss with lower calorie diet (via better food choices and also portion control or program like weight watchers) and exercise building up to or more than 30 minutes 5 days per week including some aerobic activity    

## 2013-03-26 ENCOUNTER — Emergency Department (HOSPITAL_COMMUNITY): Payer: 59

## 2013-03-26 ENCOUNTER — Emergency Department (HOSPITAL_COMMUNITY)
Admission: EM | Admit: 2013-03-26 | Discharge: 2013-03-26 | Disposition: A | Discharge status: Home / Self Care | Payer: 59 | Attending: Emergency Medicine | Admitting: Emergency Medicine

## 2013-03-26 ENCOUNTER — Encounter (HOSPITAL_COMMUNITY): Payer: Self-pay | Admitting: Emergency Medicine

## 2013-03-26 DIAGNOSIS — Y939 Activity, unspecified: Secondary | ICD-10-CM | POA: Insufficient documentation

## 2013-03-26 DIAGNOSIS — E7800 Pure hypercholesterolemia, unspecified: Secondary | ICD-10-CM | POA: Insufficient documentation

## 2013-03-26 DIAGNOSIS — F17200 Nicotine dependence, unspecified, uncomplicated: Secondary | ICD-10-CM | POA: Insufficient documentation

## 2013-03-26 DIAGNOSIS — W010XXA Fall on same level from slipping, tripping and stumbling without subsequent striking against object, initial encounter: Secondary | ICD-10-CM | POA: Insufficient documentation

## 2013-03-26 DIAGNOSIS — E119 Type 2 diabetes mellitus without complications: Secondary | ICD-10-CM | POA: Insufficient documentation

## 2013-03-26 DIAGNOSIS — S339XXA Sprain of unspecified parts of lumbar spine and pelvis, initial encounter: Secondary | ICD-10-CM | POA: Insufficient documentation

## 2013-03-26 DIAGNOSIS — I10 Essential (primary) hypertension: Secondary | ICD-10-CM | POA: Insufficient documentation

## 2013-03-26 DIAGNOSIS — Y929 Unspecified place or not applicable: Secondary | ICD-10-CM | POA: Insufficient documentation

## 2013-03-26 DIAGNOSIS — S39012A Strain of muscle, fascia and tendon of lower back, initial encounter: Secondary | ICD-10-CM

## 2013-03-26 DIAGNOSIS — Z794 Long term (current) use of insulin: Secondary | ICD-10-CM | POA: Insufficient documentation

## 2013-03-26 DIAGNOSIS — E079 Disorder of thyroid, unspecified: Secondary | ICD-10-CM | POA: Insufficient documentation

## 2013-03-26 DIAGNOSIS — Z79899 Other long term (current) drug therapy: Secondary | ICD-10-CM | POA: Insufficient documentation

## 2013-03-26 HISTORY — DX: Pure hypercholesterolemia, unspecified: E78.00

## 2013-03-26 HISTORY — DX: Essential (primary) hypertension: I10

## 2013-03-26 HISTORY — DX: Disorder of thyroid, unspecified: E07.9

## 2013-03-26 HISTORY — DX: Type 2 diabetes mellitus without complications: E11.9

## 2013-03-26 MED ORDER — HYDROCODONE-ACETAMINOPHEN 5-325 MG PO TABS: 2.0000 | ORAL_TABLET | Freq: Four times a day (QID) | ORAL | Status: DC | PRN

## 2013-03-26 MED ORDER — HYDROCODONE-ACETAMINOPHEN 5-325 MG PO TABS
2.0000 | ORAL_TABLET | Freq: Once | ORAL | Status: AC
  Administered 2013-03-26: 2 via ORAL
  Filled 2013-03-26: qty 2

## 2013-03-26 NOTE — ED Notes (Signed)
Fell to day on wet surface.  Pain lt lower back and down lt leg

## 2013-03-26 NOTE — ED Provider Notes (Signed)
CSN: [id number]     Arrival date & time 03/26/13  1426 History   First MD Initiated Contact with Patient 03/26/13 1715     Chief Complaint  Patient presents with  . Fall   (Consider location/radiation/quality/duration/timing/severity/associated sxs/prior Treatment) Patient is a 57 y.o. female presenting with fall.  Fall   Pt with history of DM reports she slipped on some water earlier today during which her legs went in different directions and she fell to the ground. She reports feeling like she 'pulled something' in her back complaining of moderate to severe aching pain in L lower back, radiating into L buttock and posterior thigh. Denies head injury or LOC. Pain is worse with movement and walking.   Past Medical History  Diagnosis Date  . Diabetes mellitus without complication   . Thyroid disease   . Hypercholesteremia   . Hypertension    Past Surgical History  Procedure Laterality Date  . Carpal tunnel release     History reviewed. No pertinent family history. History  Substance Use Topics  . Smoking status: Current Every Day Smoker -- 0.50 packs/day    Types: Cigarettes  . Smokeless tobacco: Not on file  . Alcohol Use: No   OB History   Grav Para Term Preterm Abortions TAB SAB Ect Mult Living                 Review of Systems All other systems reviewed and are negative except as noted in HPI.   Allergies  Review of patient's allergies indicates no known allergies.  Home Medications   Current Outpatient Rx  Name  Route  Sig  Dispense  Refill  . atorvastatin (LIPITOR) 40 MG tablet   Oral   Take 1 tablet (40 mg total) by mouth daily.   30 tablet   11   . buPROPion (WELLBUTRIN SR) 150 MG 12 hr tablet   Oral   Take 1 tablet (150 mg total) by mouth 2 (two) times daily.   60 tablet   11   . DULoxetine (CYMBALTA) 60 MG capsule   Oral   Take 1 capsule (60 mg total) by mouth daily.   30 capsule   11   . furosemide (LASIX) 20 MG tablet   Oral   Take 1  tablet (20 mg total) by mouth daily.   30 tablet   11   . insulin lispro (HUMALOG) 100 UNIT/ML injection   Subcutaneous   Inject 75 Units into the skin 2 (two) times daily.          Marland Kitchen levothyroxine (SYNTHROID, LEVOTHROID) 75 MCG tablet   Oral   Take 75 mcg by mouth daily.         Marland Kitchen lisinopril (PRINIVIL,ZESTRIL) 5 MG tablet   Oral   Take 1 tablet (5 mg total) by mouth daily.   30 tablet   11    BP 127/63  Pulse 87  Temp(Src) 98.2 F (36.8 C) (Oral)  Resp 20  Ht 5\' 7"  (1.702 m)  Wt 283 lb (128.368 kg)  BMI 44.31 kg/m2  SpO2 94% Physical Exam  Nursing note and vitals reviewed. Constitutional: She is oriented to person, place, and time. She appears well-developed and well-nourished.  HENT:  Head: Normocephalic and atraumatic.  Eyes: EOM are normal. Pupils are equal, round, and reactive to light.  Neck: Normal range of motion. Neck supple.  Cardiovascular: Normal rate, normal heart sounds and intact distal pulses.   Pulmonary/Chest: Effort normal and breath sounds normal.  Abdominal: Bowel sounds are normal. She exhibits no distension. There is no tenderness.  Musculoskeletal: Normal range of motion. She exhibits tenderness. She exhibits no edema.       Back:  Neurological: She is alert and oriented to person, place, and time. She has normal strength. No cranial nerve deficit or sensory deficit.  Skin: Skin is warm and dry. No rash noted.  Psychiatric: She has a normal mood and affect.    ED Course  Procedures (including critical care time) Labs Review Labs Reviewed - No data to display Imaging Review Dg Pelvis 1-2 Views  03/26/2013   CLINICAL DATA:  Fall today with left hip and buttock pain  EXAM: PELVIS - 1-2 VIEW  COMPARISON:  None.  FINDINGS: There is moderate pubic symphysis degenerative change. Pelvic bones are intact. Mild hip arthritis seen bilaterally. No femur fracture or dislocation.  IMPRESSION: No evidence of fracture.   Electronically Signed   By:  Skipper Cliche M.D.   On: 03/26/2013 18:03    EKG Interpretation   None       MDM   1. Lumbosacral strain, initial encounter     Tender over the soft tissue of low back as well as L buttock. Pain with ROM of leg and standing. Will send for xray pelvis to rule out fracture.       Charles B. Karle Starch, MD 03/26/13 [id number]

## 2013-12-25 ENCOUNTER — Telehealth: Payer: Self-pay | Admitting: Family Medicine

## 2013-12-25 DIAGNOSIS — E785 Hyperlipidemia, unspecified: Secondary | ICD-10-CM

## 2013-12-25 DIAGNOSIS — E119 Type 2 diabetes mellitus without complications: Secondary | ICD-10-CM

## 2013-12-25 NOTE — Telephone Encounter (Signed)
Message copied by Abner Greenspan on Wed Dec 25, 2013  9:58 PM ------      Message from: Ellamae Sia      Created: Tue Dec 24, 2013  3:42 PM      Regarding: Lab orders for Thursday, 7.16.15       Diabetic bundle orders for LDL and A1C, Thanks, T ------

## 2013-12-26 ENCOUNTER — Other Ambulatory Visit: Payer: Self-pay

## 2014-02-19 ENCOUNTER — Other Ambulatory Visit: Payer: Self-pay | Admitting: Family Medicine

## 2014-02-19 NOTE — Telephone Encounter (Signed)
Please schedule f/u and refill until then  

## 2014-02-19 NOTE — Telephone Encounter (Signed)
Electronic refill request, please advise  

## 2014-02-20 NOTE — Telephone Encounter (Signed)
Left voicemail requesting pt to call office back 

## 2014-02-21 NOTE — Telephone Encounter (Signed)
appt scheduled and meds refilled 

## 2014-03-25 ENCOUNTER — Ambulatory Visit: Payer: Self-pay | Admitting: Family Medicine

## 2014-03-26 ENCOUNTER — Telehealth: Payer: Self-pay | Admitting: Family Medicine

## 2014-03-26 NOTE — Telephone Encounter (Signed)
Please ask her to re schedule - does not have to be urgent however thanks

## 2014-03-26 NOTE — Telephone Encounter (Signed)
Patient did not come for their scheduled appointment 03/25/14 for med refill.  Please let me know if the patient needs to be contacted immediately for follow up or if no follow up is necessary.

## 2014-03-26 NOTE — Telephone Encounter (Signed)
Left message asking pt to call office  Please schedule below appointment

## 2014-04-01 NOTE — Telephone Encounter (Signed)
Left message asking pt to call office  °

## 2014-04-01 NOTE — Telephone Encounter (Signed)
Scheduled 10/21

## 2014-04-02 ENCOUNTER — Ambulatory Visit (INDEPENDENT_AMBULATORY_CARE_PROVIDER_SITE_OTHER): Payer: 59 | Admitting: Family Medicine

## 2014-04-02 ENCOUNTER — Encounter: Payer: Self-pay | Admitting: Family Medicine

## 2014-04-02 VITALS — BP 130/74 | HR 78 | Temp 98.4°F | Ht 67.0 in | Wt 268.8 lb

## 2014-04-02 DIAGNOSIS — Z720 Tobacco use: Secondary | ICD-10-CM

## 2014-04-02 DIAGNOSIS — F17200 Nicotine dependence, unspecified, uncomplicated: Secondary | ICD-10-CM

## 2014-04-02 DIAGNOSIS — B9789 Other viral agents as the cause of diseases classified elsewhere: Principal | ICD-10-CM

## 2014-04-02 DIAGNOSIS — I10 Essential (primary) hypertension: Secondary | ICD-10-CM

## 2014-04-02 DIAGNOSIS — F4323 Adjustment disorder with mixed anxiety and depressed mood: Secondary | ICD-10-CM

## 2014-04-02 DIAGNOSIS — E785 Hyperlipidemia, unspecified: Secondary | ICD-10-CM

## 2014-04-02 DIAGNOSIS — Z23 Encounter for immunization: Secondary | ICD-10-CM

## 2014-04-02 DIAGNOSIS — J069 Acute upper respiratory infection, unspecified: Secondary | ICD-10-CM | POA: Insufficient documentation

## 2014-04-02 DIAGNOSIS — E669 Obesity, unspecified: Secondary | ICD-10-CM

## 2014-04-02 MED ORDER — LISINOPRIL 5 MG PO TABS: ORAL_TABLET | ORAL | Status: DC

## 2014-04-02 MED ORDER — ATORVASTATIN CALCIUM 40 MG PO TABS: ORAL_TABLET | ORAL | Status: DC

## 2014-04-02 MED ORDER — BUPROPION HCL ER (SR) 150 MG PO TB12: ORAL_TABLET | ORAL | Status: DC

## 2014-04-02 MED ORDER — DULOXETINE HCL 60 MG PO CPEP: ORAL_CAPSULE | ORAL | Status: DC

## 2014-04-02 MED ORDER — ALBUTEROL SULFATE HFA 108 (90 BASE) MCG/ACT IN AERS: 2.0000 | INHALATION_SPRAY | Freq: Four times a day (QID) | RESPIRATORY_TRACT | Status: DC | PRN

## 2014-04-02 MED ORDER — FUROSEMIDE 20 MG PO TABS: ORAL_TABLET | ORAL | Status: DC

## 2014-04-02 MED ORDER — AZITHROMYCIN 250 MG PO TABS: ORAL_TABLET | ORAL | Status: DC

## 2014-04-02 NOTE — Patient Instructions (Signed)
Flu shot today  mucinex D is ok if helpful Drink lots of fluids  If worse-fill px for zithromax Inhaler as needed   Take care of yourself

## 2014-04-02 NOTE — Progress Notes (Signed)
Subjective:    Patient ID: Jennifer [name], female    DOB: October 09, 1955, 58 y.o.   MRN: [id number]  HPI Here for fu of chronic health problems   Also uri symptoms Started sat with sore throat  Now coughing- prod of white sputum, and some wheezing (when lying down)  While nasal discharge  Sinuses and ears hurt  achey but no fever She took mucinex D - helps some   Wt is down 15 lb today  Sticks to DM diet as close as she can Still sees Dr Chalmers Cater for DM and thyroid  Going ok - her last A1C- was in the 7 range  Continues the insulin   Thyroid is stable -on 80 mcg now   Mood has been as good as it can be  Stable  No change in medicines  bp is stable today  No cp or palpitations or headaches or edema  No side effects to medicines  BP Readings from Last 3 Encounters:  04/02/14 130/74  03/26/13 124/62  02/01/13 128/70      Smoking is the same -1/2 ppd  No plans to quit yet - thinks about it all the time  (she works in a [employer])    Patient Active Problem List   Diagnosis Date Noted  . Yeast vaginitis 11/04/2011  . LEUKOCYTOSIS 08/16/2010  . HYPOTHYROIDISM 07/06/2010  . BREAST MASS, RIGHT 07/06/2010  . Hyperlipidemia LDL goal <100 08/28/2008  . Essential hypertension 11/13/2007  . DIABETES MELLITUS, TYPE II 09/21/2006  . DIABETIC PERIPHERAL NEUROPATHY 09/21/2006  . Obesity 09/21/2006  . ANXIETY 09/21/2006  . TOBACCO ABUSE 09/21/2006  . Adjustment disorder with mixed anxiety and depressed mood 09/21/2006  . CARPAL TUNNEL SYNDROME 09/21/2006  . COPD 09/21/2006  . EDEMA 09/21/2006  . MIGRAINES, HX OF 09/21/2006   Past Medical History  Diagnosis Date  . Diabetes mellitus without complication   . Thyroid disease   . Hypercholesteremia   . Hypertension    Past Surgical History  Procedure Laterality Date  . Carpal tunnel release     History  Substance Use Topics  . Smoking status: Current Every Day Smoker -- 0.50 packs/day    Types: Cigarettes  . Smokeless  tobacco: Not on file  . Alcohol Use: No   No family history on file. No Known Allergies Current Outpatient Prescriptions on File Prior to Visit  Medication Sig Dispense Refill  . atorvastatin (LIPITOR) 40 MG tablet TAKE ONE TABLET BY MOUTH ONCE DAILY  30 tablet  0  . buPROPion (WELLBUTRIN SR) 150 MG 12 hr tablet TAKE ONE TABLET BY MOUTH TWICE DAILY  60 tablet  0  . DULoxetine (CYMBALTA) 60 MG capsule TAKE ONE CAPSULE BY MOUTH ONCE DAILY  30 capsule  0  . furosemide (LASIX) 20 MG tablet TAKE ONE TABLET BY MOUTH ONCE DAILY  30 tablet  0  . HYDROcodone-acetaminophen (NORCO/VICODIN) 5-325 MG per tablet Take 2 tablets by mouth every 6 (six) hours as needed for pain.  15 tablet  0  . insulin lispro (HUMALOG) 100 UNIT/ML injection Inject 75 Units into the skin 2 (two) times daily.       Marland Kitchen levothyroxine (SYNTHROID, LEVOTHROID) 75 MCG tablet Take 75 mcg by mouth daily.      Marland Kitchen lisinopril (PRINIVIL,ZESTRIL) 5 MG tablet TAKE ONE TABLET BY MOUTH ONCE DAILY  30 tablet  0   No current facility-administered medications on file prior to visit.     Review of Systems Review of Systems  Constitutional: Negative for fever, appetite change, and unexpected weight change.  Eyes: Negative for pain and visual disturbance.  Respiratory: Negative for cough and shortness of breath.   Cardiovascular: Negative for cp or palpitations    Gastrointestinal: Negative for nausea, diarrhea and constipation.  Genitourinary: Negative for urgency and frequency.  Skin: Negative for pallor or rash   Neurological: Negative for weakness, light-headedness, numbness and headaches.  Hematological: Negative for adenopathy. Does not bruise/bleed easily.  Psychiatric/Behavioral: Negative for dysphoric mood. The patient is not nervous/anxious.  pos for stressors/ mainly family        Objective:   Physical Exam  Constitutional: She appears well-developed and well-nourished. No distress.  obese and well appearing   HENT:  Head:  Normocephalic and atraumatic.  Right Ear: External ear normal.  Left Ear: External ear normal.  Mouth/Throat: Oropharynx is clear and moist.  Nares are injected and congested  No sinus tenderness   Eyes: Conjunctivae and EOM are normal. Pupils are equal, round, and reactive to light. Right eye exhibits no discharge. Left eye exhibits no discharge. No scleral icterus.  Neck: Normal range of motion. Neck supple. No JVD present. No thyromegaly present.  Cardiovascular: Normal rate, regular rhythm, normal heart sounds and intact distal pulses.  Exam reveals no gallop.   Pulmonary/Chest: Effort normal and breath sounds normal. No respiratory distress. She has no wheezes. She has no rales.  Diffusely distant bs  No rales or rhonchi  No wheeze   Abdominal: Soft. Bowel sounds are normal. She exhibits no distension and no mass. There is no tenderness.  Musculoskeletal: She exhibits no edema and no tenderness.  Lymphadenopathy:    She has no cervical adenopathy.  Neurological: She is alert. She has normal reflexes. No cranial nerve deficit. She exhibits normal muscle tone. Coordination normal.  Skin: Skin is warm and dry. No rash noted. No erythema. No pallor.  Psychiatric: She has a normal mood and affect.          Assessment & Plan:   Problem List Items Addressed This Visit     Cardiovascular and Mediastinum   Essential hypertension - Primary      bp in fair control at this time  BP Readings from Last 1 Encounters:  04/02/14 130/74   No changes needed Disc lifstyle change with low sodium diet and exercise  Will send for labs from endocrinology    Relevant Medications      lisinopril (PRINIVIL,ZESTRIL) tablet      furosemide (LASIX) tablet      atorvastatin (LIPITOR) tablet     Respiratory   Viral URI with cough     Disc symptomatic care - see instructions on AVS  Since she is a smoker - given zpack to hold and fill if respsymptoms worsen Counseled to quit smoking Update if  not starting to improve in a week or if worsening      Relevant Medications      azithromycin (ZITHROMAX) tablet     Other   Hyperlipidemia LDL goal <100     Will call for lipid panel from endocrinology Disc goals for lipids and reasons to control them Rev labs with pt- from last labs in chart  Rev low sat fat diet in detail  Continue lipitor    Relevant Medications      lisinopril (PRINIVIL,ZESTRIL) tablet      furosemide (LASIX) tablet      atorvastatin (LIPITOR) tablet   Obesity     Discussed how this problem  influences overall health and the risks it imposes  Reviewed plan for weight loss with lower calorie diet (via better food choices and also portion control or program like weight watchers) and exercise building up to or more than 30 minutes 5 days per week including some aerobic activity    Commended on wt loss so far    TOBACCO ABUSE     Disc in detail risks of smoking and possible outcomes including copd, vascular/ heart disease, cancer , respiratory and sinus infections  Pt voices understanding  She is not ready to quit     Adjustment disorder with mixed anxiety and depressed mood     Stable currently  Medicines refilled Reviewed stressors/ coping techniques/symptoms/ support sources/ tx options and side effects in detail today      Other Visit Diagnoses   Need for influenza vaccination        Relevant Orders       Flu Vaccine QUAD 36+ mos PF IM (Fluarix Quad PF) (Completed)

## 2014-04-02 NOTE — Progress Notes (Signed)
Pre visit review using our clinic review tool, if applicable. No additional management support is needed unless otherwise documented below in the visit note. 

## 2014-04-03 ENCOUNTER — Telehealth: Payer: Self-pay | Admitting: Family Medicine

## 2014-04-03 NOTE — Assessment & Plan Note (Signed)
Will call for lipid panel from endocrinology Disc goals for lipids and reasons to control them Rev labs with pt- from last labs in chart  Rev low sat fat diet in detail  Continue lipitor

## 2014-04-03 NOTE — Telephone Encounter (Signed)
emmi mailed  °

## 2014-04-03 NOTE — Assessment & Plan Note (Signed)
Discussed how this problem influences overall health and the risks it imposes  Reviewed plan for weight loss with lower calorie diet (via better food choices and also portion control or program like weight watchers) and exercise building up to or more than 30 minutes 5 days per week including some aerobic activity   Commended on wt loss so far  

## 2014-04-03 NOTE — Assessment & Plan Note (Signed)
bp in fair control at this time  BP Readings from Last 1 Encounters:  04/02/14 130/74   No changes needed Disc lifstyle change with low sodium diet and exercise  Will send for labs from endocrinology

## 2014-04-03 NOTE — Assessment & Plan Note (Signed)
Disc in detail risks of smoking and possible outcomes including copd, vascular/ heart disease, cancer , respiratory and sinus infections  Pt voices understanding She is not ready to quit  

## 2014-04-03 NOTE — Assessment & Plan Note (Signed)
Disc symptomatic care - see instructions on AVS  Since she is a smoker - given zpack to hold and fill if respsymptoms worsen Counseled to quit smoking Update if not starting to improve in a week or if worsening

## 2014-04-03 NOTE — Assessment & Plan Note (Signed)
Stable currently  Medicines refilled Reviewed stressors/ coping techniques/symptoms/ support sources/ tx options and side effects in detail today

## 2014-06-16 ENCOUNTER — Encounter: Payer: Self-pay | Admitting: Family Medicine

## 2014-12-01 ENCOUNTER — Telehealth: Payer: Self-pay

## 2014-12-01 NOTE — Telephone Encounter (Signed)
Diabetic Bundle. I left a message advising the pt her A1C lab test is due. Pt was advise to contact [facility] to schedule lab visit.

## 2014-12-08 ENCOUNTER — Telehealth: Payer: Self-pay | Admitting: *Deleted

## 2014-12-08 NOTE — Telephone Encounter (Signed)
Spoke with patient. She will call [facility] to schedule mammo.

## 2015-01-20 LAB — HM DIABETES EYE EXAM

## 2015-01-21 ENCOUNTER — Encounter: Payer: Self-pay | Admitting: Family Medicine

## 2015-01-26 LAB — HM DIABETES EYE EXAM

## 2015-02-06 ENCOUNTER — Encounter: Payer: Self-pay | Admitting: Family Medicine

## 2015-02-09 ENCOUNTER — Telehealth: Payer: Self-pay

## 2015-02-09 NOTE — Telephone Encounter (Signed)
Called to notify patient about being due for a Mammogram. Patient stated that she was too busy today, and that she would call back tomorrow to get more information on how and where to get one scheduled.

## 2015-02-17 ENCOUNTER — Encounter: Payer: Self-pay | Admitting: Family Medicine

## 2015-03-03 ENCOUNTER — Telehealth: Payer: Self-pay | Admitting: Family Medicine

## 2015-03-03 NOTE — Telephone Encounter (Signed)
Patient doesn't have insurance at the moment to complete her mammogram.   She will complete when she get insurance.

## 2015-04-05 ENCOUNTER — Other Ambulatory Visit: Payer: Self-pay | Admitting: Family Medicine

## 2015-04-06 NOTE — Telephone Encounter (Signed)
appt scheduled and med refilled 

## 2015-04-06 NOTE — Telephone Encounter (Signed)
Please schedule f/u (labs prior if able) and refill until then

## 2015-04-06 NOTE — Telephone Encounter (Signed)
Electronic refill request, pt hasn't been seen in over a yr, and no future appt scheduled, please advise

## 2015-04-23 ENCOUNTER — Telehealth: Payer: Self-pay | Admitting: Family Medicine

## 2015-04-23 DIAGNOSIS — E039 Hypothyroidism, unspecified: Secondary | ICD-10-CM

## 2015-04-23 DIAGNOSIS — E785 Hyperlipidemia, unspecified: Secondary | ICD-10-CM

## 2015-04-23 DIAGNOSIS — E11319 Type 2 diabetes mellitus with unspecified diabetic retinopathy without macular edema: Secondary | ICD-10-CM

## 2015-04-23 DIAGNOSIS — I10 Essential (primary) hypertension: Secondary | ICD-10-CM

## 2015-04-23 DIAGNOSIS — Z794 Long term (current) use of insulin: Secondary | ICD-10-CM

## 2015-04-23 NOTE — Telephone Encounter (Signed)
-----   Message from Ellamae Sia sent at 04/21/2015 12:38 PM EST ----- Regarding: Lab orders for Friday, 11.11.16 Lab orders for a f/u appt

## 2015-04-24 ENCOUNTER — Other Ambulatory Visit: Payer: Self-pay

## 2015-05-01 ENCOUNTER — Ambulatory Visit: Payer: Self-pay | Admitting: Family Medicine

## 2015-05-01 DIAGNOSIS — Z0289 Encounter for other administrative examinations: Secondary | ICD-10-CM

## 2015-05-05 ENCOUNTER — Ambulatory Visit (INDEPENDENT_AMBULATORY_CARE_PROVIDER_SITE_OTHER): Payer: Self-pay | Admitting: Family Medicine

## 2015-05-05 ENCOUNTER — Encounter: Payer: Self-pay | Admitting: Family Medicine

## 2015-05-05 VITALS — BP 136/74 | HR 84 | Temp 98.7°F | Ht 67.0 in | Wt 281.5 lb

## 2015-05-05 DIAGNOSIS — F4323 Adjustment disorder with mixed anxiety and depressed mood: Secondary | ICD-10-CM

## 2015-05-05 DIAGNOSIS — E11319 Type 2 diabetes mellitus with unspecified diabetic retinopathy without macular edema: Secondary | ICD-10-CM

## 2015-05-05 DIAGNOSIS — F17200 Nicotine dependence, unspecified, uncomplicated: Secondary | ICD-10-CM

## 2015-05-05 DIAGNOSIS — Z794 Long term (current) use of insulin: Secondary | ICD-10-CM

## 2015-05-05 DIAGNOSIS — L089 Local infection of the skin and subcutaneous tissue, unspecified: Secondary | ICD-10-CM

## 2015-05-05 DIAGNOSIS — I10 Essential (primary) hypertension: Secondary | ICD-10-CM

## 2015-05-05 MED ORDER — FLUOXETINE HCL 20 MG PO TABS: 20.0000 mg | ORAL_TABLET | Freq: Every day | ORAL | Status: DC

## 2015-05-05 MED ORDER — CEPHALEXIN 500 MG PO CAPS: 500.0000 mg | ORAL_CAPSULE | Freq: Three times a day (TID) | ORAL | Status: DC

## 2015-05-05 MED ORDER — LISINOPRIL 5 MG PO TABS: 5.0000 mg | ORAL_TABLET | Freq: Every day | ORAL | Status: DC

## 2015-05-05 MED ORDER — BUPROPION HCL ER (SR) 150 MG PO TB12: 150.0000 mg | ORAL_TABLET | Freq: Two times a day (BID) | ORAL | Status: DC

## 2015-05-05 MED ORDER — ATORVASTATIN CALCIUM 40 MG PO TABS: 40.0000 mg | ORAL_TABLET | Freq: Every day | ORAL | Status: DC

## 2015-05-05 NOTE — Progress Notes (Signed)
Subjective:    Patient ID: Jennifer [name], female    DOB: 13-May-1956, 59 y.o.   MRN: [id number]  HPI Here for f/u of chronic medical problems Wt is up 13 lb with bmi of 44  Will get a flu shot at the pharmacy  Cannot afford it here   Getting over a cold  Has a growth on her face - L chin  Was mashing it - nothing came out-no pus  Has been there 2 weeks   Still seeing Dr Chalmers Cater - doing ok  Had to inc her synthroid recently A1C was down in the 7s   Wt is up 13 lb  bmi 80  Got laid off in August  Job searching and hard to get employment at her age   65 cholesterol also   No insurance currently   Has been going to the eye doctor-has retinopathy   Still smokes  May have to quit based on cost Not really motivated to quit   Still taking wellbutrin  Cannot afford cymbalta - off of it about a month  Would like to get back on prozac   Patient Active Problem List   Diagnosis Date Noted  . Viral URI with cough 04/02/2014  . Yeast vaginitis 11/04/2011  . LEUKOCYTOSIS 08/16/2010  . Hypothyroidism 07/06/2010  . BREAST MASS, RIGHT 07/06/2010  . Hyperlipidemia LDL goal <100 08/28/2008  . Essential hypertension 11/13/2007  . Diabetes type 2, controlled ([place]) 09/21/2006  . DIABETIC PERIPHERAL NEUROPATHY 09/21/2006  . Obesity 09/21/2006  . ANXIETY 09/21/2006  . TOBACCO ABUSE 09/21/2006  . Adjustment disorder with mixed anxiety and depressed mood 09/21/2006  . CARPAL TUNNEL SYNDROME 09/21/2006  . COPD 09/21/2006  . EDEMA 09/21/2006  . MIGRAINES, HX OF 09/21/2006   Past Medical History  Diagnosis Date  . Diabetes mellitus without complication ([place])   . Thyroid disease   . Hypercholesteremia   . Hypertension    Past Surgical History  Procedure Laterality Date  . Carpal tunnel release     Social History  Substance Use Topics  . Smoking status: Current Every Day Smoker -- 0.50 packs/day    Types: Cigarettes  . Smokeless tobacco: None  . Alcohol Use: No   No  family history on file. No Known Allergies Current Outpatient Prescriptions on File Prior to Visit  Medication Sig Dispense Refill  . atorvastatin (LIPITOR) 40 MG tablet TAKE ONE TABLET BY MOUTH ONCE DAILY 30 tablet 0  . buPROPion (WELLBUTRIN SR) 150 MG 12 hr tablet TAKE ONE TABLET BY MOUTH TWICE DAILY 60 tablet 0  . DULoxetine (CYMBALTA) 60 MG capsule TAKE ONE CAPSULE BY MOUTH ONCE DAILY 30 capsule 11  . furosemide (LASIX) 20 MG tablet TAKE ONE TABLET BY MOUTH ONCE DAILY 30 tablet 0  . insulin lispro (HUMALOG) 100 UNIT/ML injection Inject 75 Units into the skin 2 (two) times daily.     Marland Kitchen lisinopril (PRINIVIL,ZESTRIL) 5 MG tablet TAKE ONE TABLET BY MOUTH ONCE DAILY 30 tablet 0   No current facility-administered medications on file prior to visit.        Review of Systems    Review of Systems  Constitutional: Negative for fever, appetite change, fatigue and unexpected weight change.  Eyes: Negative for pain and visual disturbance.  Respiratory: Negative for cough and shortness of breath.   Cardiovascular: Negative for cp or palpitations    Gastrointestinal: Negative for nausea, diarrhea and constipation.  Genitourinary: Negative for urgency and frequency.  Skin: Negative for pallor  or rash  pos for area of skin infection on face  Neurological: Negative for weakness, light-headedness, numbness and headaches.  Hematological: Negative for adenopathy. Does not bruise/bleed easily.  Psychiatric/Behavioral:pos for dep and anx mood without SI    Objective:   Physical Exam  Constitutional: She appears well-developed and well-nourished. No distress.  obese and well appearing   HENT:  Head: Normocephalic and atraumatic.  Mouth/Throat: Oropharynx is clear and moist.  Eyes: Conjunctivae and EOM are normal. Pupils are equal, round, and reactive to light.  Neck: Normal range of motion. Neck supple. No JVD present. Carotid bruit is not present. No thyromegaly present.  Cardiovascular: Normal  rate, regular rhythm, normal heart sounds and intact distal pulses.  Exam reveals no gallop.   Pulmonary/Chest: Effort normal and breath sounds normal. No respiratory distress. She has no wheezes. She has no rales.  No crackles  Diffusely distant bs No wheezes   Abdominal: Soft. Bowel sounds are normal. She exhibits no distension, no abdominal bruit and no mass. There is no tenderness.  Musculoskeletal: She exhibits no edema.  Lymphadenopathy:    She has no cervical adenopathy.  Neurological: She is alert. She has normal reflexes.  Skin: Skin is warm and dry. No rash noted.  1 by 1.5  Cm area of erythema and induration with central scab on L side of chin  No drainage/not fluctuant Mildly tender No streaking   Psychiatric: She has a normal mood and affect.          Assessment & Plan:   Problem List Items Addressed This Visit      Cardiovascular and Mediastinum   Essential hypertension - Primary    bp in fair control at this time  BP Readings from Last 1 Encounters:  05/05/15 136/74   No changes needed Disc lifstyle change with low sodium diet and exercise  req last labs from Dr Chalmers Cater       Relevant Medications   lisinopril (PRINIVIL,ZESTRIL) 5 MG tablet   atorvastatin (LIPITOR) 40 MG tablet     Endocrine   Diabetes type 2, controlled ([place])    Sees Dr Chalmers Cater /endocrinology Pt pt overall doing better Sent for last note and labs to reviewe       Relevant Medications   lisinopril (PRINIVIL,ZESTRIL) 5 MG tablet   atorvastatin (LIPITOR) 40 MG tablet     Musculoskeletal and Integument   Skin infection    Cellulitis (area of non fluctuant erythema and induration) on L chin  (in a diabetic) Enc soap and water cleansing and warm compresses  tx with keflex 500 tid  Update if not starting to improve in a week or if worsening        Relevant Medications   cephALEXin (KEFLEX) 500 MG capsule     Other   Adjustment disorder with mixed anxiety and depressed mood     Wants to increase her prozac dose - will start back on 20 and titrate to 40 as tolerated Discussed expectations of SSRI medication including time to effectiveness and mechanism of action, also poss of side effects (early and late)- including mental fuzziness, weight or appetite change, nausea and poss of worse dep or anxiety (even suicidal thoughts)  Pt voiced understanding and will stop med and update if this occurs  Reviewed stressors/ coping techniques/symptoms/ support sources/ tx options and side effects in detail today  Many stressors currently      TOBACCO ABUSE    Disc in detail risks of smoking and possible  outcomes including copd, vascular/ heart disease, cancer , respiratory and sinus infections  Pt voices understanding  She is not yet ready to quit

## 2015-05-05 NOTE — Progress Notes (Signed)
Pre visit review using our clinic review tool, if applicable. No additional management support is needed unless otherwise documented below in the visit note. 

## 2015-05-05 NOTE — Patient Instructions (Signed)
Continue current medicines Start prozac 20 mg - after 2 weeks if you want to increase dose to 40 mg let me know Keep area on face clean with soap and water  Take the keflex antibiotic as directed    (Update if not starting to improve in a week or if worsening ) Also use a clean warm compresses  Think about quitting smoking Work on healthy diet and exercise when you can  We will send for labs from Dr Chalmers Cater

## 2015-05-08 NOTE — Assessment & Plan Note (Signed)
bp in fair control at this time  BP Readings from Last 1 Encounters:  05/05/15 136/74   No changes needed Disc lifstyle change with low sodium diet and exercise  req last labs from Dr Chalmers Cater

## 2015-05-08 NOTE — Assessment & Plan Note (Signed)
Sees Dr Chalmers Cater /endocrinology Pt pt overall doing better Sent for last note and labs to reviewe

## 2015-05-08 NOTE — Assessment & Plan Note (Signed)
Disc in detail risks of smoking and possible outcomes including copd, vascular/ heart disease, cancer , respiratory and sinus infections  Pt voices understanding  She is not yet ready to quit

## 2015-05-08 NOTE — Assessment & Plan Note (Signed)
In insulin diabetic pt  Discussed how this problem influences overall health and the risks it imposes  Reviewed plan for weight loss with lower calorie diet (via better food choices and also portion control or program like weight watchers) and exercise building up to or more than 30 minutes 5 days per week including some aerobic activity

## 2015-05-08 NOTE — Assessment & Plan Note (Signed)
Cellulitis (area of non fluctuant erythema and induration) on L chin  (in a diabetic) Enc soap and water cleansing and warm compresses  tx with keflex 500 tid  Update if not starting to improve in a week or if worsening

## 2015-05-08 NOTE — Assessment & Plan Note (Signed)
Wants to increase her prozac dose - will start back on 20 and titrate to 40 as tolerated Discussed expectations of SSRI medication including time to effectiveness and mechanism of action, also poss of side effects (early and late)- including mental fuzziness, weight or appetite change, nausea and poss of worse dep or anxiety (even suicidal thoughts)  Pt voiced understanding and will stop med and update if this occurs  Reviewed stressors/ coping techniques/symptoms/ support sources/ tx options and side effects in detail today  Many stressors currently

## 2015-05-14 ENCOUNTER — Other Ambulatory Visit: Payer: Self-pay | Admitting: Family Medicine

## 2015-07-15 ENCOUNTER — Other Ambulatory Visit: Payer: Self-pay | Admitting: Family Medicine

## 2015-07-15 NOTE — Telephone Encounter (Signed)
Left voicemail requesting pt to call office back 

## 2015-07-15 NOTE — Telephone Encounter (Signed)
Electronic refill request, not on med list but prozac is please advise

## 2015-07-15 NOTE — Telephone Encounter (Signed)
Last note indicates she could not afford it and was put on prozac instead  Perhaps that has changed  Please call her and clarify - re: what she is on or wants to be on  Thanks

## 2015-07-21 NOTE — Telephone Encounter (Signed)
That is fine  I sent it electronically

## 2015-07-21 NOTE — Telephone Encounter (Signed)
Pt went back to work and now has insurance and would like to go from prozac back to the Cymbalta

## 2015-09-17 ENCOUNTER — Encounter: Payer: Self-pay | Admitting: Orthopaedic Surgery

## 2015-09-17 ENCOUNTER — Ambulatory Visit (INDEPENDENT_AMBULATORY_CARE_PROVIDER_SITE_OTHER): Payer: Self-pay | Admitting: Orthopaedic Surgery

## 2015-09-17 VITALS — BP 174/99 | HR 85 | Temp 97.7°F | Ht 67.0 in | Wt 283.0 lb

## 2015-09-17 DIAGNOSIS — M25562 Pain in left knee: Secondary | ICD-10-CM

## 2015-09-17 DIAGNOSIS — M25561 Pain in right knee: Secondary | ICD-10-CM

## 2015-09-17 MED ORDER — TIZANIDINE HCL 4 MG PO TABS: ORAL_TABLET | ORAL | Status: DC

## 2015-09-17 MED ORDER — DICLOFENAC SODIUM 75 MG PO TBEC: 75.0000 mg | DELAYED_RELEASE_TABLET | Freq: Two times a day (BID) | ORAL | Status: DC

## 2015-09-17 MED ORDER — HYDROCODONE-ACETAMINOPHEN 7.5-325 MG PO TABS: 1.0000 | ORAL_TABLET | ORAL | Status: DC | PRN

## 2015-09-17 NOTE — Progress Notes (Signed)
Subjective: my knees hurt    Patient ID: Jennifer [name], female    DOB: [birth year], 61 y.o.   MRN: [id number]  Knee Pain  The pain is present in the left knee and right knee. The quality of the pain is described as aching. The pain is at a severity of 3/10. The pain is mild. Associated symptoms include a loss of motion, a loss of sensation (peripheral neuropathy), numbness and tingling. Pertinent negatives include no muscle weakness. The symptoms are aggravated by weight bearing. She has tried ice, heat, elevation, non-weight bearing, NSAIDs and rest for the symptoms. The treatment provided moderate relief.   She has long standing DJD of both knees, left more than the right.  She has no giving way, no locking, no redness.  She has popping and swelling.   Review of Systems  Constitutional:       She is current smoker  HENT: Negative for congestion.   Respiratory: Positive for shortness of breath. Negative for cough.   Cardiovascular: Negative for chest pain and leg swelling.  Endocrine: Positive for cold intolerance.  Musculoskeletal: Positive for joint swelling, arthralgias and gait problem.  Allergic/Immunologic: Positive for environmental allergies.  Neurological: Positive for tingling, numbness and headaches.       Peripheral neruopathy   Past Medical History  Diagnosis Date  . Diabetes mellitus without complication ([place])   . Thyroid disease   . Hypercholesteremia   . Hypertension    Past Surgical History  Procedure Laterality Date  . Carpal tunnel release         Social History   Social History  . Marital Status: Married    Spouse Name: N/A  . Number of Children: N/A  . Years of Education: N/A   Occupational History  . Not on file.   Social History Main Topics  . Smoking status: Current Every Day Smoker -- 0.50 packs/day    Types: Cigarettes  . Smokeless tobacco: Never Used  . Alcohol Use: No  . Drug Use: No  . Sexual Activity: Not on file   Other Topics  Concern  . Not on file   Social History Narrative   BP 174/99 mmHg  Pulse 85  Temp(Src) 97.7 F (36.5 C) (Tympanic)  Ht 5\' 7"  (1.702 m)  Wt 283 lb (128.368 kg)  BMI 44.31 kg/m2  Objective:   Physical Exam  Constitutional: She is oriented to person, place, and time. She appears well-developed and well-nourished.  HENT:  Head: Normocephalic and atraumatic.  Eyes: Conjunctivae and EOM are normal. Pupils are equal, round, and reactive to light.  Neck: Normal range of motion. Neck supple.  Cardiovascular: Normal rate, regular rhythm and intact distal pulses.   Pulmonary/Chest: Effort normal.  Abdominal: Soft.  Musculoskeletal: She exhibits tenderness (Both knees tender.  ROM of left 0 - 100, right 0 -105, crepitus, effusion both knees, stable).       Legs: Neurological: She is alert and oriented to person, place, and time. She has normal reflexes. She displays normal reflexes. No cranial nerve deficit. She exhibits normal muscle tone. Coordination normal.  Skin: Skin is warm and dry.  Psychiatric: She has a normal mood and affect. Her behavior is normal. Judgment and thought content normal.   The bilateral lower extremity is examined:  Inspection:  Thigh:  Non-tender and no defects  Knee has swelling 1+ effusion.  Joint tenderness is present                        Patient is tender over the medial joint line  Lower Leg:  Has normal appearance and no tenderness or defects  Ankle:  Non-tender and no defects  Foot:  Non-tender and no defects Range of Motion:  Knee:  Range of motion is: 0-100 left, 0-105 right                        Crepitus is  present  Ankle:  Range of motion is normal. Strength and Tone:  The bilateral lower extremity has normal strength and tone. Stability:  Knee:  The knee is stable.  Ankle:  The ankle is stable.  PROCEDURE NOTE:  The patient request injection, verbal consent was obtained.  The bilateral knees were prepped  appropriately after time out was performed.   Sterile technique was observed and injection of 1 cc of Depo-Medrol 40 mg with several cc's of plain xylocaine. Anesthesia was provided by ethyl chloride and a 20-gauge needle was used to inject the knee area. The injection was tolerated well. This was done the same for both knees.  A band aid dressing was applied.  The patient was advised to apply ice later today and tomorrow to the injection sight as needed.       Assessment & Plan:   Encounter Diagnoses  Name Primary?  . Left knee pain Yes  . Right knee pain    I have refilled her medicines.  She was given precautions.  Call if any problem.  Return in three months.  I did talk to her about her smoking and asked her to cut back on it if she cannot stop.  She is not willing to stop but is willing to consider cutting back.  She has diabetes that had been poorly controlled but is under better control now.  She does not know her last A1C level.  She already has peripheral neuropathy and I have gone over her knowing her disease better.  She is on insulin.  She works third shift.  She is aware of problems with diabetes.  Her hypertension is also controlled.  She has no edema.  She watches her salt intake.

## 2015-11-03 ENCOUNTER — Emergency Department (HOSPITAL_COMMUNITY): Payer: Commercial Managed Care - HMO

## 2015-11-03 ENCOUNTER — Encounter (HOSPITAL_COMMUNITY): Payer: Self-pay | Admitting: Emergency Medicine

## 2015-11-03 ENCOUNTER — Inpatient Hospital Stay (HOSPITAL_COMMUNITY)
Admission: EM | Admit: 2015-11-03 | Discharge: 2015-11-05 | DRG: 247 | Disposition: A | Discharge status: Home / Self Care | Payer: Commercial Managed Care - HMO | Attending: Cardiology | Admitting: Cardiology

## 2015-11-03 ENCOUNTER — Encounter (HOSPITAL_COMMUNITY)
Admission: EM | Disposition: A | Discharge status: Home / Self Care | Payer: Self-pay | Source: Home / Self Care | Attending: Cardiology

## 2015-11-03 DIAGNOSIS — E119 Type 2 diabetes mellitus without complications: Secondary | ICD-10-CM | POA: Diagnosis present

## 2015-11-03 DIAGNOSIS — E669 Obesity, unspecified: Secondary | ICD-10-CM | POA: Diagnosis present

## 2015-11-03 DIAGNOSIS — I2510 Atherosclerotic heart disease of native coronary artery without angina pectoris: Secondary | ICD-10-CM | POA: Diagnosis not present

## 2015-11-03 DIAGNOSIS — Z955 Presence of coronary angioplasty implant and graft: Secondary | ICD-10-CM

## 2015-11-03 DIAGNOSIS — I10 Essential (primary) hypertension: Secondary | ICD-10-CM | POA: Diagnosis present

## 2015-11-03 DIAGNOSIS — E039 Hypothyroidism, unspecified: Secondary | ICD-10-CM | POA: Diagnosis present

## 2015-11-03 DIAGNOSIS — E1169 Type 2 diabetes mellitus with other specified complication: Secondary | ICD-10-CM | POA: Diagnosis present

## 2015-11-03 DIAGNOSIS — E11319 Type 2 diabetes mellitus with unspecified diabetic retinopathy without macular edema: Secondary | ICD-10-CM

## 2015-11-03 DIAGNOSIS — Z6838 Body mass index (BMI) 38.0-38.9, adult: Secondary | ICD-10-CM

## 2015-11-03 DIAGNOSIS — E785 Hyperlipidemia, unspecified: Secondary | ICD-10-CM | POA: Diagnosis present

## 2015-11-03 DIAGNOSIS — E113512 Type 2 diabetes mellitus with proliferative diabetic retinopathy with macular edema, left eye: Secondary | ICD-10-CM

## 2015-11-03 DIAGNOSIS — F17210 Nicotine dependence, cigarettes, uncomplicated: Secondary | ICD-10-CM | POA: Diagnosis present

## 2015-11-03 DIAGNOSIS — Z794 Long term (current) use of insulin: Secondary | ICD-10-CM | POA: Diagnosis not present

## 2015-11-03 DIAGNOSIS — F17200 Nicotine dependence, unspecified, uncomplicated: Secondary | ICD-10-CM | POA: Diagnosis present

## 2015-11-03 DIAGNOSIS — I214 Non-ST elevation (NSTEMI) myocardial infarction: Secondary | ICD-10-CM | POA: Diagnosis not present

## 2015-11-03 DIAGNOSIS — Z9861 Coronary angioplasty status: Secondary | ICD-10-CM

## 2015-11-03 DIAGNOSIS — R079 Chest pain, unspecified: Secondary | ICD-10-CM | POA: Diagnosis not present

## 2015-11-03 HISTORY — DX: Atherosclerotic heart disease of native coronary artery without angina pectoris: I25.10

## 2015-11-03 HISTORY — PX: CARDIAC CATHETERIZATION: SHX172

## 2015-11-03 LAB — DIFFERENTIAL
BASOS ABS: 0 10*3/uL (ref 0.0–0.1)
Basophils Relative: 0 %
EOS ABS: 0.2 10*3/uL (ref 0.0–0.7)
Eosinophils Relative: 2 %
LYMPHS ABS: 2.9 10*3/uL (ref 0.7–4.0)
LYMPHS PCT: 26 %
Monocytes Absolute: 0.8 10*3/uL (ref 0.1–1.0)
Monocytes Relative: 7 %
NEUTROS PCT: 65 %
Neutro Abs: 7.3 10*3/uL (ref 1.7–7.7)

## 2015-11-03 LAB — COMPREHENSIVE METABOLIC PANEL
ALBUMIN: 2.8 g/dL — AB (ref 3.5–5.0)
ALT: 21 U/L (ref 14–54)
ALT: 20 U/L (ref 14–54)
ANION GAP: 7 (ref 5–15)
AST: 21 U/L (ref 15–41)
AST: 32 U/L (ref 15–41)
Albumin: 3.4 g/dL — ABNORMAL LOW (ref 3.5–5.0)
Alkaline Phosphatase: 71 U/L (ref 38–126)
Alkaline Phosphatase: 61 U/L (ref 38–126)
Anion gap: 7 (ref 5–15)
BUN: 17 mg/dL (ref 6–20)
BUN: 13 mg/dL (ref 6–20)
CHLORIDE: 101 mmol/L (ref 101–111)
CHLORIDE: 103 mmol/L (ref 101–111)
CO2: 30 mmol/L (ref 22–32)
CO2: 29 mmol/L (ref 22–32)
CREATININE: 0.77 mg/dL (ref 0.44–1.00)
Calcium: 9.3 mg/dL (ref 8.9–10.3)
Calcium: 9.5 mg/dL (ref 8.9–10.3)
Creatinine, Ser: 0.77 mg/dL (ref 0.44–1.00)
GFR calc Af Amer: >60 mL/min (ref >60)
GFR calc Af Amer: >60 mL/min (ref >60)
GFR calc non Af Amer: >60 mL/min (ref >60)
Glucose, Bld: 131 mg/dL — ABNORMAL HIGH (ref 65–99)
Glucose, Bld: 163 mg/dL — ABNORMAL HIGH (ref 65–99)
POTASSIUM: 3.6 mmol/L (ref 3.5–5.1)
POTASSIUM: 3.7 mmol/L (ref 3.5–5.1)
SODIUM: 139 mmol/L (ref 135–145)
Sodium: 138 mmol/L (ref 135–145)
TOTAL PROTEIN: 6.6 g/dL (ref 6.5–8.1)
Total Bilirubin: 0.4 mg/dL (ref 0.3–1.2)
Total Bilirubin: 0.6 mg/dL (ref 0.3–1.2)
Total Protein: 5.3 g/dL — ABNORMAL LOW (ref 6.5–8.1)

## 2015-11-03 LAB — CBC
HCT: 40.7 % (ref 36.0–46.0)
HEMOGLOBIN: 13.4 g/dL (ref 12.0–15.0)
MCH: 27.1 pg (ref 26.0–34.0)
MCHC: 32.9 g/dL (ref 30.0–36.0)
MCV: 82.4 fL (ref 78.0–100.0)
PLATELETS: 240 10*3/uL (ref 150–400)
RBC: 4.94 MIL/uL (ref 3.87–5.11)
RDW: 14.5 % (ref 11.5–15.5)
WBC: 11.2 10*3/uL — ABNORMAL HIGH (ref 4.0–10.5)

## 2015-11-03 LAB — APTT: APTT: 27 s (ref 24–37)

## 2015-11-03 LAB — LIPID PANEL
CHOL/HDL RATIO: 4.3 ratio
CHOLESTEROL: 187 mg/dL (ref 0–200)
HDL: 43 mg/dL (ref >40)
LDL Cholesterol: 105 mg/dL — ABNORMAL HIGH (ref 0–99)
TRIGLYCERIDES: 197 mg/dL — AB (ref <150)
VLDL: 39 mg/dL (ref 0–40)

## 2015-11-03 LAB — TROPONIN I: TROPONIN I: 0.5 ng/mL — AB (ref <0.031)

## 2015-11-03 LAB — BRAIN NATRIURETIC PEPTIDE: B Natriuretic Peptide: 62.8 pg/mL (ref 0.0–100.0)

## 2015-11-03 LAB — MAGNESIUM: Magnesium: 1.7 mg/dL (ref 1.7–2.4)

## 2015-11-03 LAB — PROTIME-INR
INR: 0.99 (ref 0.00–1.49)
PROTHROMBIN TIME: 13.3 s (ref 11.6–15.2)

## 2015-11-03 SURGERY — LEFT HEART CATH AND CORONARY ANGIOGRAPHY
Anesthesia: Moderate Sedation

## 2015-11-03 MED ORDER — LIDOCAINE HCL (PF) 1 % IJ SOLN
INTRAMUSCULAR | Status: AC
  Filled 2015-11-03: qty 30

## 2015-11-03 MED ORDER — BIVALIRUDIN 250 MG IV SOLR
INTRAVENOUS | Status: AC
  Filled 2015-11-03: qty 250

## 2015-11-03 MED ORDER — LIDOCAINE HCL (PF) 1 % IJ SOLN
INTRAMUSCULAR | Status: DC | PRN
  Administered 2015-11-03: 3 mL

## 2015-11-03 MED ORDER — FENTANYL CITRATE (PF) 100 MCG/2ML IJ SOLN
INTRAMUSCULAR | Status: AC
  Filled 2015-11-03: qty 2

## 2015-11-03 MED ORDER — INSULIN ASPART 100 UNIT/ML ~~LOC~~ SOLN
0.0000 [IU] | SUBCUTANEOUS | Status: DC
  Administered 2015-11-04 (×3): 4 [IU] via SUBCUTANEOUS
  Administered 2015-11-04: 2 [IU] via SUBCUTANEOUS

## 2015-11-03 MED ORDER — HEPARIN (PORCINE) IN NACL 2-0.9 UNIT/ML-% IJ SOLN
INTRAMUSCULAR | Status: DC | PRN
  Administered 2015-11-03: 1500 mL

## 2015-11-03 MED ORDER — HEPARIN (PORCINE) IN NACL 2-0.9 UNIT/ML-% IJ SOLN
INTRAMUSCULAR | Status: AC
  Filled 2015-11-03: qty 1000

## 2015-11-03 MED ORDER — IOPAMIDOL (ISOVUE-370) INJECTION 76%
INTRAVENOUS | Status: AC
  Filled 2015-11-03: qty 50

## 2015-11-03 MED ORDER — HEPARIN BOLUS VIA INFUSION
4000.0000 [IU] | Freq: Once | INTRAVENOUS | Status: AC
  Administered 2015-11-03: 4000 [IU] via INTRAVENOUS

## 2015-11-03 MED ORDER — IOPAMIDOL (ISOVUE-370) INJECTION 76%
INTRAVENOUS | Status: AC
  Filled 2015-11-03: qty 125

## 2015-11-03 MED ORDER — MORPHINE SULFATE (PF) 4 MG/ML IV SOLN: 4.0000 mg | Freq: Once | INTRAVENOUS | Status: DC

## 2015-11-03 MED ORDER — FENTANYL CITRATE (PF) 100 MCG/2ML IJ SOLN
INTRAMUSCULAR | Status: DC | PRN
  Administered 2015-11-03: 25 ug via INTRAVENOUS

## 2015-11-03 MED ORDER — TICAGRELOR 90 MG PO TABS
ORAL_TABLET | ORAL | Status: AC
Start: 2015-11-03 — End: 2015-11-03
  Filled 2015-11-03: qty 1

## 2015-11-03 MED ORDER — VERAPAMIL HCL 2.5 MG/ML IV SOLN
INTRAVENOUS | Status: AC
  Filled 2015-11-03: qty 2

## 2015-11-03 MED ORDER — ASPIRIN 81 MG PO CHEW
324.0000 mg | CHEWABLE_TABLET | Freq: Once | ORAL | Status: AC
  Administered 2015-11-03: 324 mg via ORAL
  Filled 2015-11-03: qty 4

## 2015-11-03 MED ORDER — NITROGLYCERIN 0.4 MG SL SUBL: 0.4000 mg | SUBLINGUAL_TABLET | SUBLINGUAL | Status: DC | PRN

## 2015-11-03 MED ORDER — TICAGRELOR 90 MG PO TABS
ORAL_TABLET | ORAL | Status: DC | PRN
  Administered 2015-11-03: 180 mg via ORAL

## 2015-11-03 MED ORDER — ACETAMINOPHEN 325 MG PO TABS
650.0000 mg | ORAL_TABLET | ORAL | Status: DC | PRN
  Administered 2015-11-04 (×3): 650 mg via ORAL
  Filled 2015-11-03 (×3): qty 2

## 2015-11-03 MED ORDER — BIVALIRUDIN BOLUS VIA INFUSION - CUPID
INTRAVENOUS | Status: DC | PRN
  Administered 2015-11-03: 96.6 mg via INTRAVENOUS

## 2015-11-03 MED ORDER — LEVOTHYROXINE SODIUM 112 MCG PO TABS
112.0000 ug | ORAL_TABLET | Freq: Every day | ORAL | Status: DC
  Administered 2015-11-04 – 2015-11-05 (×2): 112 ug via ORAL
  Filled 2015-11-03 (×2): qty 1

## 2015-11-03 MED ORDER — MIDAZOLAM HCL 2 MG/2ML IJ SOLN
INTRAMUSCULAR | Status: AC
  Filled 2015-11-03: qty 2

## 2015-11-03 MED ORDER — BIVALIRUDIN 250 MG IV SOLR
250.0000 mg | INTRAVENOUS | Status: DC | PRN
  Administered 2015-11-03 (×2): 1.75 mg/kg/h via INTRAVENOUS

## 2015-11-03 MED ORDER — IOPAMIDOL (ISOVUE-370) INJECTION 76%
INTRAVENOUS | Status: DC | PRN
Start: 2015-11-03 — End: 2015-11-03
  Administered 2015-11-03: 255 mL via INTRAVENOUS

## 2015-11-03 MED ORDER — NITROGLYCERIN IN D5W 200-5 MCG/ML-% IV SOLN
10.0000 ug/min | INTRAVENOUS | Status: DC
  Administered 2015-11-03: 10 ug/min via INTRAVENOUS
  Filled 2015-11-03: qty 250

## 2015-11-03 MED ORDER — BUPROPION HCL ER (SR) 150 MG PO TB12
300.0000 mg | ORAL_TABLET | Freq: Every evening | ORAL | Status: DC
  Administered 2015-11-04 (×2): 300 mg via ORAL
  Filled 2015-11-03 (×2): qty 2

## 2015-11-03 MED ORDER — ONDANSETRON HCL 4 MG/2ML IJ SOLN: 4.0000 mg | Freq: Four times a day (QID) | INTRAMUSCULAR | Status: DC | PRN

## 2015-11-03 MED ORDER — METOPROLOL TARTRATE 12.5 MG HALF TABLET
12.5000 mg | ORAL_TABLET | Freq: Two times a day (BID) | ORAL | Status: DC
  Administered 2015-11-04: 12.5 mg via ORAL
  Filled 2015-11-03: qty 1

## 2015-11-03 MED ORDER — BIVALIRUDIN 250 MG IV SOLR
INTRAVENOUS | Status: AC
Start: 2015-11-03 — End: 2015-11-03
  Filled 2015-11-03: qty 250

## 2015-11-03 MED ORDER — HEPARIN (PORCINE) IN NACL 100-0.45 UNIT/ML-% IJ SOLN
1400.0000 [IU]/h | INTRAMUSCULAR | Status: DC
  Administered 2015-11-03: 1400 [IU]/h via INTRAVENOUS
  Filled 2015-11-03: qty 250

## 2015-11-03 MED ORDER — MIDAZOLAM HCL 2 MG/2ML IJ SOLN
INTRAMUSCULAR | Status: DC | PRN
  Administered 2015-11-03: 1 mg via INTRAVENOUS

## 2015-11-03 MED ORDER — LISINOPRIL 5 MG PO TABS
5.0000 mg | ORAL_TABLET | Freq: Every evening | ORAL | Status: DC
  Administered 2015-11-04 (×2): 5 mg via ORAL
  Filled 2015-11-03 (×2): qty 1

## 2015-11-03 MED ORDER — NITROGLYCERIN 1 MG/10 ML FOR IR/CATH LAB
INTRA_ARTERIAL | Status: AC
Start: 2015-11-03 — End: 2015-11-03
  Filled 2015-11-03: qty 10

## 2015-11-03 MED ORDER — ATORVASTATIN CALCIUM 80 MG PO TABS
80.0000 mg | ORAL_TABLET | Freq: Every day | ORAL | Status: DC
  Administered 2015-11-04 – 2015-11-05 (×3): 80 mg via ORAL
  Filled 2015-11-03 (×3): qty 1

## 2015-11-03 MED ORDER — ACETAMINOPHEN 325 MG PO TABS
650.0000 mg | ORAL_TABLET | Freq: Once | ORAL | Status: AC
  Administered 2015-11-03: 650 mg via ORAL
  Filled 2015-11-03: qty 2

## 2015-11-03 MED ORDER — NITROGLYCERIN 1 MG/10 ML FOR IR/CATH LAB
INTRA_ARTERIAL | Status: DC | PRN
  Administered 2015-11-03 (×2): 200 ug via INTRACORONARY

## 2015-11-03 MED ORDER — VERAPAMIL HCL 2.5 MG/ML IV SOLN
INTRAVENOUS | Status: DC | PRN
  Administered 2015-11-03: 1.2 mL via INTRA_ARTERIAL

## 2015-11-03 MED ORDER — HEPARIN SODIUM (PORCINE) 1000 UNIT/ML IJ SOLN
INTRAMUSCULAR | Status: AC
  Filled 2015-11-03: qty 1

## 2015-11-03 MED ORDER — NITROGLYCERIN 0.4 MG SL SUBL
0.4000 mg | SUBLINGUAL_TABLET | SUBLINGUAL | Status: DC | PRN
Start: 2015-11-03 — End: 2015-11-03
  Administered 2015-11-03: 0.4 mg via SUBLINGUAL
  Filled 2015-11-03: qty 1

## 2015-11-03 MED ORDER — ASPIRIN EC 81 MG PO TBEC
81.0000 mg | DELAYED_RELEASE_TABLET | Freq: Every day | ORAL | Status: DC
  Administered 2015-11-04 – 2015-11-05 (×2): 81 mg via ORAL
  Filled 2015-11-03 (×2): qty 1

## 2015-11-03 MED ORDER — IOPAMIDOL (ISOVUE-370) INJECTION 76%
INTRAVENOUS | Status: AC
Start: 2015-11-03 — End: 2015-11-03
  Filled 2015-11-03: qty 100

## 2015-11-03 SURGICAL SUPPLY — 16 items
BALLN EMERGE MR 2.0X12 (BALLOONS) ×2
BALLOON EMERGE MR 2.0X12 (BALLOONS) IMPLANT
CATH INFINITI 5FR ANG PIGTAIL (CATHETERS) ×1 IMPLANT
CATH OPTITORQUE TIG 4.0 5F (CATHETERS) ×1 IMPLANT
DEVICE RAD COMP TR BAND LRG (VASCULAR PRODUCTS) ×1 IMPLANT
GLIDESHEATH SLEND A-KIT 6F 22G (SHEATH) ×1 IMPLANT
GUIDE CATH RUNWAY 6FR FR4 (CATHETERS) ×1 IMPLANT
KIT ENCORE 26 ADVANTAGE (KITS) ×2 IMPLANT
KIT HEART LEFT (KITS) ×2 IMPLANT
PACK CARDIAC CATHETERIZATION (CUSTOM PROCEDURE TRAY) ×2 IMPLANT
STENT RESOLUTE INTEG 2.25X12 (Permanent Stent) ×1 IMPLANT
STENT RESOLUTE INTEG 2.25X14 (Permanent Stent) ×1 IMPLANT
TRANSDUCER W/STOPCOCK (MISCELLANEOUS) ×2 IMPLANT
TUBING CIL FLEX 10 FLL-RA (TUBING) ×2 IMPLANT
WIRE RUNTHROUGH .014X180CM (WIRE) ×1 IMPLANT
WIRE SAFE-T 1.5MM-J .035X260CM (WIRE) ×1 IMPLANT

## 2015-11-03 NOTE — Interval H&P Note (Signed)
History and Physical Interval Note:  11/03/2015 10:30 PM  Jennifer [name]  has presented today for surgery, with the diagnosis of urgent non-STEMI The various methods of treatment have been discussed with the patient and family. After consideration of risks, benefits and other options for treatment, the patient has consented to  Procedure(s): Left Heart Cath and Coronary Angiography (N/A) with possible Percutaneous Coronary Intervention as a surgical intervention .  The patient's history has been reviewed, patient examined, no change in status, stable for surgery.  I have reviewed the patient's chart and labs.  Questions were answered to the patient's satisfaction.     Glenetta Hew

## 2015-11-03 NOTE — Progress Notes (Signed)
ANTICOAGULATION CONSULT NOTE - Initial Consult  Pharmacy Consult for Heparin Indication: chest pain/ACS  No Known Allergies  Patient Measurements: Height: 5\' 11"  (180.3 cm) Weight: 284 lb (128.822 kg) IBW/kg (Calculated) : 70.8  HEPARIN DW (KG): 100.6   Vital Signs: BP: 158/71 mmHg (05/23 1815) Pulse Rate: 79 (05/23 1815)  Labs:  Recent Labs  11/03/15 1720  HGB 13.4  HCT 40.7  PLT 240  APTT 27  LABPROT 13.3  INR 0.99  CREATININE 0.77  TROPONINI 0.50*    Estimated Creatinine Clearance: 111 mL/min (by C-G formula based on Cr of 0.77).   Medical History: Past Medical History  Diagnosis Date  . Diabetes mellitus without complication ([place])   . Thyroid disease   . Hypercholesteremia   . Hypertension     Medications:   (Not in a hospital admission) Home Meds Reviewed, admission med rec pending.  Assessment: Okay for Protocol, no bleeding noted.  Elevated Troponin  Goal of Therapy:  Heparin level 0.3-0.7 units/ml Monitor platelets by anticoagulation protocol: Yes   Plan:  Give 4000 units bolus x 1 Start heparin infusion at 1400 units/hr Check anti-Xa level in 6-8 hours and daily while on heparin Continue to monitor H&H and platelets  Pricilla Larsson 11/03/2015,6:45 PM

## 2015-11-03 NOTE — H&P (Addendum)
Jennifer [name] is an 61 y.o. female.   Chief Complaint: chest pain Primary Cardiologist: new HPI: Jennifer [name] is a 60 yo woman with PMH of T2DM, hypertension, dyslipidemia and continued tobacco use who presented to [facility] with chest discomfort that began at 4 am today while she was working third shift. She had sudden onset of chest discomfort that localized to the middle to left-side of her chest with some radiation into her bilateral shoulders and back. She characterized the pain as tightness with severity as bad as 7/10. Her symptoms have no associated dyspnea but she did have some brief jaw pain. She denies prior MI/coronary artery disease, no heart failure symptoms or edema/swelling. She says she doesn't walk a lot but she denies exercise intolerance or chest pain with activity at home. 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   Her ECG was mildly abnormal and troponin noted to be 0.5 and she was started on heparin bolus + gtt, asa 324 mg and arranged transfer to [facility]. She was started on NTG gtt with improved chest discomfort but only down to 2/10 currently on 70 mcg/kg/min. Repeat ECG with subtle ST 1/2 mm ST elevation.    No upcoming surgeries, she is interested in quitting smoking, no bleeding issues.  Past Medical History  Diagnosis Date  . Diabetes mellitus without complication ([place])   . Thyroid disease   . Hypercholesteremia   . Hypertension     Past Surgical History  Procedure Laterality Date  . Carpal tunnel release    . Abdominal hysterectomy      History reviewed. No pertinent family history. Social History:  reports that she has been smoking Cigarettes.  She has been smoking about 0.50 packs per day. She has never used smokeless tobacco. She reports that she does not drink alcohol or use illicit drugs.  Allergies: No Known Allergies  Medications Prior to Admission  Medication Sig Dispense Refill  . atorvastatin  (LIPITOR) 40 MG tablet Take 1 tablet (40 mg total) by mouth daily. (Patient taking differently: Take 40 mg by mouth every evening. ) 30 tablet 11  . buPROPion (WELLBUTRIN SR) 150 MG 12 hr tablet Take 1 tablet (150 mg total) by mouth 2 (two) times daily. (Patient taking differently: Take 300 mg by mouth every evening. ) 60 tablet 11  . diclofenac (VOLTAREN) 75 MG EC tablet Take 1 tablet (75 mg total) by mouth 2 (two) times daily with a meal. (Patient taking differently: Take 75 mg by mouth every evening. ) 60 tablet 2  . DULoxetine (CYMBALTA) 60 MG capsule TAKE ONE CAPSULE BY MOUTH ONCE DAILY (Patient taking differently: TAKE ONE CAPSULE BY MOUTH ONCE DAILY IN THE EVENING) 30 capsule 11  . furosemide (LASIX) 20 MG tablet TAKE ONE TABLET BY MOUTH ONCE DAILY (Patient taking differently: TAKE ONE TABLET BY MOUTH ONCE DAILY IN THE EVENING) 30 tablet 11  . HYDROcodone-acetaminophen (NORCO) 7.5-325 MG tablet Take 1 tablet by mouth every 4 (four) hours as needed for moderate pain (Must last 30 days.  Do not drive or operate machinery while taking this medicine.). 120 tablet 0  . insulin lispro (HUMALOG) 100 UNIT/ML injection Inject 75 Units into the skin 2 (two) times daily.     Marland Kitchen levothyroxine (SYNTHROID, LEVOTHROID) 112 MCG tablet Take 112 mcg by mouth every evening.     Marland Kitchen lisinopril (PRINIVIL,ZESTRIL) 5 MG tablet Take 1 tablet (5 mg total) by mouth daily. (Patient taking  differently: Take 5 mg by mouth every evening. ) 30 tablet 11  . sitaGLIPtin (JANUVIA) 100 MG tablet Take 100 mg by mouth every evening.     Marland Kitchen tiZANidine (ZANAFLEX) 4 MG tablet One by mouth every 8 hours as needed for spasm 90 tablet 3    Results for orders placed or performed during the hospital encounter of 11/03/15 (from the past 48 hour(s))  CBC     Status: Abnormal   Collection Time: 11/03/15  5:20 PM  Result Value Ref Range   WBC 11.2 (H) 4.0 - 10.5 K/uL   RBC 4.94 3.87 - 5.11 MIL/uL   Hemoglobin 13.4 12.0 - 15.0 g/dL   HCT 40.7  36.0 - 46.0 %   MCV 82.4 78.0 - 100.0 fL   MCH 27.1 26.0 - 34.0 pg   MCHC 32.9 30.0 - 36.0 g/dL   RDW 14.5 11.5 - 15.5 %   Platelets 240 150 - 400 K/uL  Differential     Status: None   Collection Time: 11/03/15  5:20 PM  Result Value Ref Range   Neutrophils Relative % 65 %   Neutro Abs 7.3 1.7 - 7.7 K/uL   Lymphocytes Relative 26 %   Lymphs Abs 2.9 0.7 - 4.0 K/uL   Monocytes Relative 7 %   Monocytes Absolute 0.8 0.1 - 1.0 K/uL   Eosinophils Relative 2 %   Eosinophils Absolute 0.2 0.0 - 0.7 K/uL   Basophils Relative 0 %   Basophils Absolute 0.0 0.0 - 0.1 K/uL  Protime-INR     Status: None   Collection Time: 11/03/15  5:20 PM  Result Value Ref Range   Prothrombin Time 13.3 11.6 - 15.2 seconds   INR 0.99 0.00 - 1.49  APTT     Status: None   Collection Time: 11/03/15  5:20 PM  Result Value Ref Range   aPTT 27 24 - 37 seconds  Comprehensive metabolic panel     Status: Abnormal   Collection Time: 11/03/15  5:20 PM  Result Value Ref Range   Sodium 138 135 - 145 mmol/L   Potassium 3.6 3.5 - 5.1 mmol/L   Chloride 101 101 - 111 mmol/L   CO2 30 22 - 32 mmol/L   Glucose, Bld 131 (H) 65 - 99 mg/dL   BUN 17 6 - 20 mg/dL   Creatinine, Ser 0.77 0.44 - 1.00 mg/dL   Calcium 9.3 8.9 - 10.3 mg/dL   Total Protein 6.6 6.5 - 8.1 g/dL   Albumin 3.4 (L) 3.5 - 5.0 g/dL   AST 21 15 - 41 U/L   ALT 21 14 - 54 U/L   Alkaline Phosphatase 71 38 - 126 U/L   Total Bilirubin 0.4 0.3 - 1.2 mg/dL   GFR calc non Af Amer >60 >60 mL/min   GFR calc Af Amer >60 >60 mL/min    Comment: (NOTE) The eGFR has been calculated using the CKD EPI equation. This calculation has not been validated in all clinical situations. eGFR's persistently <60 mL/min signify possible Chronic Kidney Disease.    Anion gap 7 5 - 15  Troponin I     Status: Abnormal   Collection Time: 11/03/15  5:20 PM  Result Value Ref Range   Troponin I 0.50 (HH) <0.031 ng/mL    Comment:        POSSIBLE MYOCARDIAL ISCHEMIA. SERIAL  TESTING RECOMMENDED. CRITICAL RESULT CALLED TO, READ BACK BY AND VERIFIED WITH: MINTER,R ON 11/03/15 AT 1805 BY LOY,C   Lipid panel  Status: Abnormal   Collection Time: 11/03/15  5:20 PM  Result Value Ref Range   Cholesterol 187 0 - 200 mg/dL   Triglycerides 197 (H) <150 mg/dL   HDL 43 >40 mg/dL   Total CHOL/HDL Ratio 4.3 RATIO   VLDL 39 0 - 40 mg/dL   LDL Cholesterol 105 (H) 0 - 99 mg/dL    Comment:        Total Cholesterol/HDL:CHD Risk Coronary Heart Disease Risk Table                     Men   Women  1/2 Average Risk   3.4   3.3  Average Risk       5.0   4.4  2 X Average Risk   9.6   7.1  3 X Average Risk  23.4   11.0        Use the calculated Patient Ratio above and the CHD Risk Table to determine the patient's CHD Risk.        ATP III CLASSIFICATION (LDL):  <100     mg/dL   Optimal  100-129  mg/dL   Near or Above                    Optimal  130-159  mg/dL   Borderline  160-189  mg/dL   High  >190     mg/dL   Very High    Dg Chest Portable 1 View  11/03/2015  CLINICAL DATA:  Chest pain with shortness of breath and weakness. Left arm pain. EXAM: PORTABLE CHEST 1 VIEW COMPARISON:  05/17/2009 FINDINGS: The cardiomediastinal silhouette is within normal limits. There is mild elevation of the right hemidiaphragm. No airspace consolidation, edema, pleural effusion, or pneumothorax is identified. No acute osseous abnormality is seen. IMPRESSION: No active disease. Electronically Signed   By: Logan Bores M.D.   On: 11/03/2015 17:53    Review of Systems  Constitutional: Negative for fever, chills and malaise/fatigue.  HENT: Negative for ear discharge, ear pain and tinnitus.   Eyes: Negative for double vision and photophobia.  Respiratory: Negative for cough, sputum production and shortness of breath.   Cardiovascular: Positive for chest pain and leg swelling. Negative for claudication.  Gastrointestinal: Negative for vomiting, abdominal pain, blood in stool and melena.   Genitourinary: Negative for urgency and frequency.  Musculoskeletal: Positive for back pain. Negative for myalgias and falls.  Skin: Negative for rash.  Neurological: Positive for tingling. Negative for dizziness, tremors, sensory change and speech change.  Endo/Heme/Allergies: Negative for polydipsia. Does not bruise/bleed easily.  Psychiatric/Behavioral: Negative for depression, suicidal ideas and hallucinations.   Blood pressure 191/80, pulse 79, resp. rate 17, height '5\' 11"'  (1.803 m), weight 128.822 kg (284 lb), SpO2 98 %. Physical Exam  Nursing note reviewed. Constitutional: She is oriented to person, place, and time. She appears well-developed and well-nourished. No distress.  HENT:  Head: Normocephalic and atraumatic.  Nose: Nose normal.  Mouth/Throat: Oropharynx is clear and moist. No oropharyngeal exudate.  Eyes: Conjunctivae and EOM are normal. Pupils are equal, round, and reactive to light. No scleral icterus.  Neck: Normal range of motion. Neck supple. No JVD present. No tracheal deviation present.  Cardiovascular: Normal rate, regular rhythm, normal heart sounds and intact distal pulses.  Exam reveals no gallop.   No murmur heard. Respiratory: Effort normal and breath sounds normal. No respiratory distress. She has no wheezes. She has no rales.  GI: Soft. Bowel sounds  are normal. She exhibits no distension. There is no tenderness. There is no rebound.  Musculoskeletal: Normal range of motion. She exhibits edema.  Trace edema bilaterally  Neurological: She is alert and oriented to person, place, and time. She has normal reflexes. No cranial nerve deficit. Coordination normal.  Skin: Skin is warm and dry. No rash noted. She is not diaphoretic. No erythema.  Psychiatric: She has a normal mood and affect. Her behavior is normal. Thought content normal.  labs reviewed; bun/cr 17/0.77, ast/alt 21/21 Trop 0.5  LDL 105, hdl 43 Wbc 11.2, h/h 13.4/40.7, plt 240 Chest x-ray: poor  penetration, increased cephalization  Assessment/Plan Jennifer [name] is a 60 yo woman with PMH of T2DM, hypertension, dyslipidemia and continued tobacco use who presented to [facility] with chest discomfort and found to have NSTEMI. CP didn't resolve and on arrival to [facility], repeat ECG revealed ~ 1/2 MM ST elevation and mild continued pain. Urgent catheterization team activation. Discussed risks/benefits with family. Anticipate likely PCI.  Problem List Chest pain/NSTEMI T2DM Hypertension Dyslipidemia Tobacco abuse Hypothyroidism  Plan 1. Admit to ICU, trend cardiac biomarkers 2. Continue heparin gtt, asa 81 mg daily (received 324 mg), P2Y12 based on coronary anatomy 3. Metoprolol 12.5 mg bid 4. Continue lisinopril 5 mg daily, atorvastatin 80 mg qHS first dose now 5. Hba1c, TSH, lipid panel, echocardiogram in AM 6. Smoking cessation counseling daily, initiated today ~ 10 minutes including medication treatment options 7. Hold oral anti-glycemics, SS insulin  Jules Husbands, MD 11/03/2015, 9:14 PM  Interventional Attending Attestation I have seen, examined and evaluated the patient this PM along with Dr. Claiborne Billings.  After reviewing all the available data and chart, we discussed the patients laboratory, study & physical findings as well as symptoms in detail. I agree with his findings, examination as well as impression recommendations as per our discussion.    Dr. Claiborne Billings called me to review these EKG findings.  The patient was transferred from [facility] with symptoms consistent with ACS (chest pain with mild troponin elevation). Initial EKG at [facility] was relatively benign, however the EKGs of upon arrival to [facility] showed very subtle changes with now at decreased R-wave with even small Q-wave in inferior leads with subtle 0.5 mm ST elevations in leads III and aVF. This does not meet criteria for STEMI, however the patient is persistently having 2 out of 10 chest pain despite being on 70  g of nitroglycerin infusion.  I discussed this with Dr. Claiborne Billings, we both felt that based on her presenting symptom of ACS with accommodation of persistent chest pain, dynamic ST-T wave changes and mild troponin elevation, we should then proceed with invasive evaluation with cardiac catheterization to exclude possible subtle inferior STEMI that just does not show up due to voltage in the inferior leads. Concern would be for either distal RCA branch or circumflex-OM lesion.  Ultimately we decided that the best course of action would be to proceed with invasive evaluation cardiac catheterization plus minus PCI.  The procedure with Risks/Benefits/Alternatives and Indications was reviewed with the patient along with her husband and 2 daughters.  All questions were answered.    Risks / Complications include, but not limited to: Death, MI, CVA/TIA, VF/VT (with defibrillation), Bradycardia (need for temporary pacer placement), contrast induced nephropathy, bleeding / bruising / hematoma / pseudoaneurysm, vascular or coronary injury (with possible emergent CT or Vascular Surgery), adverse medication reactions, infection.  Additional risks involving the use of radiation with the possibility of  radiation burns and cancer were explained in detail.  The patient ( and family) voice understanding and agree to proceed.      Glenetta Hew, M.D., M.S. Interventional Cardiologist   Pager # [phone] Phone # [phone] [address] [place], [place] [address]

## 2015-11-03 NOTE — ED Notes (Signed)
Patient complaining of left sided chest pain radiating into bilateral shoulders and jaws since last night.

## 2015-11-03 NOTE — ED Notes (Signed)
EDP at bedside  

## 2015-11-03 NOTE — ED Notes (Signed)
CRITICAL VALUE ALERT  Critical value received:  Troponin .5  Date of notification:  11/03/15  Time of notification:  [id number]  Critical value read back:Yes.    Nurse who received alert:  Cherly Hensen  MD notified (1st page):  Dr. Regenia Skeeter  Time of first page:  [phone]  MD notified (2nd page):  Time of second page:  Responding MD:  Dr. Regenia Skeeter  Time MD responded:  [phone]

## 2015-11-03 NOTE — ED Notes (Signed)
Attempted report. Receiving Unit reported pt going to room 13 not 3 and would call back for report. Primary RN aware.

## 2015-11-03 NOTE — ED Provider Notes (Signed)
CSN: [id number]     Arrival date & time 11/03/15  1704 History   First MD Initiated Contact with Patient 11/03/15 1717     Chief Complaint  Patient presents with  . Chest Pain     (Consider location/radiation/quality/duration/timing/severity/associated sxs/prior Treatment) HPI  60 year old female with a history of diabetes, hypertension, and hyperlipidemia who currently smokes presents with chest pain since 4 AM. She works third shift and while she was working she noticed a sudden onset pain. It is in her center in left chest. She also has associated shoulder pain bilaterally and back pain. The pain feels like a tightness and rates as a 6 or 7 out of 10. Has not gotten better or worse in a matter what she does. She transiently had jaw pain but that is currently gone. No vomiting. No shortness of breath. Patient has not seen any new leg swelling or leg pain. No prior known history of CAD.  Past Medical History  Diagnosis Date  . Diabetes mellitus without complication ([place])   . Thyroid disease   . Hypercholesteremia   . Hypertension    Past Surgical History  Procedure Laterality Date  . Carpal tunnel release    . Abdominal hysterectomy     History reviewed. No pertinent family history. Social History  Substance Use Topics  . Smoking status: Current Every Day Smoker -- 0.50 packs/day    Types: Cigarettes  . Smokeless tobacco: Never Used  . Alcohol Use: No   OB History    No data available     Review of Systems  Constitutional: Negative for fever.  Respiratory: Negative for cough and shortness of breath.   Cardiovascular: Positive for chest pain. Negative for leg swelling.  Gastrointestinal: Negative for nausea, vomiting and abdominal pain.  Musculoskeletal: Positive for back pain, arthralgias and neck pain.  All other systems reviewed and are negative.     Allergies  Review of patient's allergies indicates no known allergies.  Home Medications   Prior to Admission  medications   Medication Sig Start Date End Date Taking? Authorizing Provider  atorvastatin (LIPITOR) 40 MG tablet Take 1 tablet (40 mg total) by mouth daily. 05/05/15   Abner Greenspan, MD  buPROPion (WELLBUTRIN SR) 150 MG 12 hr tablet Take 1 tablet (150 mg total) by mouth 2 (two) times daily. 05/05/15   Abner Greenspan, MD  diclofenac (VOLTAREN) 75 MG EC tablet Take 1 tablet (75 mg total) by mouth 2 (two) times daily with a meal. 09/17/15   Sanjuana Kava, MD  DULoxetine (CYMBALTA) 60 MG capsule TAKE ONE CAPSULE BY MOUTH ONCE DAILY 07/21/15   Abner Greenspan, MD  furosemide (LASIX) 20 MG tablet TAKE ONE TABLET BY MOUTH ONCE DAILY 05/14/15   Abner Greenspan, MD  HYDROcodone-acetaminophen (NORCO) 7.5-325 MG tablet Take 1 tablet by mouth every 4 (four) hours as needed for moderate pain (Must last 30 days.  Do not drive or operate machinery while taking this medicine.). 09/17/15   Sanjuana Kava, MD  insulin lispro (HUMALOG) 100 UNIT/ML injection Inject 75 Units into the skin 2 (two) times daily.     Historical Provider, MD  levothyroxine (SYNTHROID, LEVOTHROID) 112 MCG tablet Take 112 mcg by mouth daily before breakfast.    Historical Provider, MD  lisinopril (PRINIVIL,ZESTRIL) 5 MG tablet Take 1 tablet (5 mg total) by mouth daily. 05/05/15   Abner Greenspan, MD  sitaGLIPtin (JANUVIA) 100 MG tablet Take 100 mg by mouth daily.    Historical Provider,  MD  tiZANidine (ZANAFLEX) 4 MG tablet One by mouth every 8 hours as needed for spasm 09/17/15   Sanjuana Kava, MD   There were no vitals taken for this visit. Physical Exam  Constitutional: She is oriented to person, place, and time. She appears well-developed and well-nourished. No distress.  Sitting upright, resting comfortably in stretcher  HENT:  Head: Normocephalic and atraumatic.  Right Ear: External ear normal.  Left Ear: External ear normal.  Nose: Nose normal.  Eyes: Right eye exhibits no discharge. Left eye exhibits no discharge.  Cardiovascular: Normal rate,  regular rhythm and normal heart sounds.   Pulses:      Radial pulses are 2+ on the right side, and 2+ on the left side.  Pulmonary/Chest: Effort normal and breath sounds normal. She exhibits no tenderness.  Abdominal: Soft. She exhibits no distension. There is no tenderness.  Musculoskeletal: She exhibits no edema.  Neurological: She is alert and oriented to person, place, and time.  Skin: Skin is warm and dry. She is not diaphoretic.  Nursing note and vitals reviewed.   ED Course  Procedures (including critical care time) Labs Review Labs Reviewed  CBC  DIFFERENTIAL  PROTIME-INR  APTT  COMPREHENSIVE METABOLIC PANEL  TROPONIN I  LIPID PANEL  TROPONIN I  TROPONIN I    Imaging Review Dg Chest Portable 1 View  11/03/2015  CLINICAL DATA:  Chest pain with shortness of breath and weakness. Left arm pain. EXAM: PORTABLE CHEST 1 VIEW COMPARISON:  05/17/2009 FINDINGS: The cardiomediastinal silhouette is within normal limits. There is mild elevation of the right hemidiaphragm. No airspace consolidation, edema, pleural effusion, or pneumothorax is identified. No acute osseous abnormality is seen. IMPRESSION: No active disease. Electronically Signed   By: Logan Bores M.D.   On: 11/03/2015 17:53   I have personally reviewed and evaluated these images and lab results as part of my medical decision-making.   EKG Interpretation   Date/Time:  Tuesday Nov 03 2015 17:06:57 EDT Ventricular Rate:  89 PR Interval:  164 QRS Duration: 90 QT Interval:  364 QTC Calculation: 442 R Axis:   67 Text Interpretation:  Normal sinus rhythm Abnormal ECG slight ST elevation   diffusely seems similar to 2002 Confirmed by Jack Bolio MD, [place] [phone])  on 11/03/2015 5:17:56 PM       EKG Interpretation  Date/Time:  Tuesday Nov 03 2015 17:06:57 EDT Ventricular Rate:  89 PR Interval:  164 QRS Duration: 90 QT Interval:  364 QTC Calculation: 442 R Axis:   67 Text Interpretation:  Normal sinus rhythm  Abnormal ECG slight ST elevation  diffusely seems similar to 2002 Confirmed by Rafeal Skibicki MD, [place] [phone]) on 11/03/2015 5:17:56 PM        EKG Interpretation  Date/Time:  Tuesday Nov 03 2015 18:51:52 EDT Ventricular Rate:  81 PR Interval:  163 QRS Duration: 94 QT Interval:  393 QTC Calculation: 456 R Axis:   65 Text Interpretation:  Sinus rhythm no significant change from earlier in the day Confirmed by Kathalene Sporer MD, [place] Sakamoto [phone]) on 11/03/2015 7:01:36 PM       CRITICAL CARE Performed by: Sherwood Gambler T   Total critical care time: 35 minutes  Critical care time was exclusive of separately billable procedures and treating other patients.  Critical care was necessary to treat or prevent imminent or life-threatening deterioration.  Critical care was time spent personally by me on the following activities: development of treatment plan with patient and/or surrogate as well as nursing, discussions with  consultants, evaluation of patient's response to treatment, examination of patient, obtaining history from patient or surrogate, ordering and performing treatments and interventions, ordering and review of laboratory studies, ordering and review of radiographic studies, pulse oximetry and re-evaluation of patient's condition.  MDM   Final diagnoses:  NSTEMI (non-ST elevated myocardial infarction) [facility])    Patient presents with continued pain for over 12 hours. Initial trop 0.5. Pain not really improved with SL Nitro. Started on heparin and nitro gtt and given morphine. Initial ECG with possible slight (less than 13mm) ST elevation inferiorly but to me this seems similar to 2002. She does not appear in distress. It seems the worst of her pain was at 4 am. Repeat ECGs show no progressive changes. D/w Cardiology, Dr. Meda Coffee, who accepts in transfer and admission to CCU at [facility].     Sherwood Gambler, MD 11/04/15 [phone]

## 2015-11-03 NOTE — ED Notes (Signed)
Second nitro admin.

## 2015-11-04 ENCOUNTER — Encounter (HOSPITAL_COMMUNITY): Payer: Self-pay | Admitting: Cardiology

## 2015-11-04 ENCOUNTER — Inpatient Hospital Stay (HOSPITAL_COMMUNITY): Payer: Commercial Managed Care - HMO

## 2015-11-04 ENCOUNTER — Encounter (HOSPITAL_COMMUNITY)
Admission: EM | Disposition: A | Discharge status: Home / Self Care | Payer: Self-pay | Source: Home / Self Care | Attending: Cardiology

## 2015-11-04 DIAGNOSIS — I10 Essential (primary) hypertension: Secondary | ICD-10-CM

## 2015-11-04 DIAGNOSIS — I214 Non-ST elevation (NSTEMI) myocardial infarction: Principal | ICD-10-CM

## 2015-11-04 DIAGNOSIS — I2510 Atherosclerotic heart disease of native coronary artery without angina pectoris: Secondary | ICD-10-CM

## 2015-11-04 DIAGNOSIS — E785 Hyperlipidemia, unspecified: Secondary | ICD-10-CM

## 2015-11-04 DIAGNOSIS — R079 Chest pain, unspecified: Secondary | ICD-10-CM

## 2015-11-04 DIAGNOSIS — Z9861 Coronary angioplasty status: Secondary | ICD-10-CM

## 2015-11-04 LAB — BASIC METABOLIC PANEL
Anion gap: 6 (ref 5–15)
BUN: 10 mg/dL (ref 6–20)
CALCIUM: 8.6 mg/dL — AB (ref 8.9–10.3)
CO2: 26 mmol/L (ref 22–32)
CREATININE: 0.81 mg/dL (ref 0.44–1.00)
Chloride: 106 mmol/L (ref 101–111)
GFR calc non Af Amer: >60 mL/min (ref >60)
Glucose, Bld: 151 mg/dL — ABNORMAL HIGH (ref 65–99)
Potassium: 3.6 mmol/L (ref 3.5–5.1)
SODIUM: 138 mmol/L (ref 135–145)

## 2015-11-04 LAB — GLUCOSE, CAPILLARY
Glucose-Capillary: 165 mg/dL — ABNORMAL HIGH (ref 65–99)
Glucose-Capillary: 163 mg/dL — ABNORMAL HIGH (ref 65–99)
Glucose-Capillary: 172 mg/dL — ABNORMAL HIGH (ref 65–99)
Glucose-Capillary: 195 mg/dL — ABNORMAL HIGH (ref 65–99)
Glucose-Capillary: 204 mg/dL — ABNORMAL HIGH (ref 65–99)
Glucose-Capillary: 269 mg/dL — ABNORMAL HIGH (ref 65–99)

## 2015-11-04 LAB — CBC
HEMATOCRIT: 35.5 % — AB (ref 36.0–46.0)
HEMOGLOBIN: 11.5 g/dL — AB (ref 12.0–15.0)
MCH: 26.2 pg (ref 26.0–34.0)
MCHC: 32.4 g/dL (ref 30.0–36.0)
MCV: 80.9 fL (ref 78.0–100.0)
Platelets: 190 10*3/uL (ref 150–400)
RBC: 4.39 MIL/uL (ref 3.87–5.11)
RDW: 14.6 % (ref 11.5–15.5)
WBC: 8.2 10*3/uL (ref 4.0–10.5)

## 2015-11-04 LAB — TROPONIN I
TROPONIN I: 2.39 ng/mL — AB (ref <0.031)
TROPONIN I: 26.55 ng/mL — AB (ref <0.031)
TROPONIN I: 9.89 ng/mL — AB (ref <0.031)
Troponin I: 15.57 ng/mL (ref <0.031)

## 2015-11-04 LAB — LIPID PANEL
CHOLESTEROL: 154 mg/dL (ref 0–200)
HDL: 37 mg/dL — ABNORMAL LOW (ref >40)
LDL Cholesterol: 79 mg/dL (ref 0–99)
TRIGLYCERIDES: 190 mg/dL — AB (ref <150)
Total CHOL/HDL Ratio: 4.2 RATIO
VLDL: 38 mg/dL (ref 0–40)

## 2015-11-04 LAB — PROTIME-INR
INR: 1.68 — AB (ref 0.00–1.49)
Prothrombin Time: 19.8 seconds — ABNORMAL HIGH (ref 11.6–15.2)

## 2015-11-04 LAB — ECHOCARDIOGRAM COMPLETE
HEIGHTINCHES: 71 in
Weight: 4479.75 oz

## 2015-11-04 LAB — TSH: TSH: 5.253 u[IU]/mL — AB (ref 0.350–4.500)

## 2015-11-04 LAB — POCT ACTIVATED CLOTTING TIME: Activated Clotting Time: 420 seconds

## 2015-11-04 LAB — MRSA PCR SCREENING: MRSA BY PCR: NEGATIVE

## 2015-11-04 SURGERY — LEFT HEART CATH AND CORONARY ANGIOGRAPHY
Anesthesia: LOCAL

## 2015-11-04 MED ORDER — SODIUM CHLORIDE 0.9 % IV SOLN
0.1500 mg/kg/h | INTRAVENOUS | Status: DC
  Filled 2015-11-04: qty 250

## 2015-11-04 MED ORDER — SODIUM CHLORIDE 0.9% FLUSH
3.0000 mL | INTRAVENOUS | Status: DC | PRN
Start: 2015-11-04 — End: 2015-11-05

## 2015-11-04 MED ORDER — CARVEDILOL 6.25 MG PO TABS
6.2500 mg | ORAL_TABLET | Freq: Two times a day (BID) | ORAL | Status: DC
  Administered 2015-11-04 – 2015-11-05 (×2): 6.25 mg via ORAL
  Filled 2015-11-04 (×2): qty 1

## 2015-11-04 MED ORDER — CARVEDILOL 3.125 MG PO TABS
3.1250 mg | ORAL_TABLET | Freq: Two times a day (BID) | ORAL | Status: DC
  Administered 2015-11-04: 3.125 mg via ORAL
  Filled 2015-11-04: qty 1

## 2015-11-04 MED ORDER — SODIUM CHLORIDE 0.9 % WEIGHT BASED INFUSION
3.0000 mL/kg/h | INTRAVENOUS | Status: DC
  Administered 2015-11-04: 3 mL/kg/h via INTRAVENOUS

## 2015-11-04 MED ORDER — BIVALIRUDIN 250 MG IV SOLR
1.7500 mg/kg/h | INTRAVENOUS | Status: AC
  Administered 2015-11-04: 1.75 mg/kg/h via INTRAVENOUS
  Filled 2015-11-04: qty 250

## 2015-11-04 MED ORDER — SODIUM CHLORIDE 0.9 % IV SOLN: 250.0000 mL | INTRAVENOUS | Status: DC | PRN

## 2015-11-04 MED ORDER — INSULIN ASPART 100 UNIT/ML ~~LOC~~ SOLN
0.0000 [IU] | Freq: Three times a day (TID) | SUBCUTANEOUS | Status: DC
  Administered 2015-11-04: 8 [IU] via SUBCUTANEOUS
  Administered 2015-11-04: 12 [IU] via SUBCUTANEOUS
  Administered 2015-11-05: 8 [IU] via SUBCUTANEOUS
  Administered 2015-11-05: 4 [IU] via SUBCUTANEOUS

## 2015-11-04 MED ORDER — PNEUMOCOCCAL VAC POLYVALENT 25 MCG/0.5ML IJ INJ: 0.5000 mL | INJECTION | INTRAMUSCULAR | Status: DC

## 2015-11-04 MED ORDER — TICAGRELOR 90 MG PO TABS
90.0000 mg | ORAL_TABLET | Freq: Two times a day (BID) | ORAL | Status: DC
  Administered 2015-11-04 – 2015-11-05 (×3): 90 mg via ORAL
  Filled 2015-11-04 (×3): qty 1

## 2015-11-04 MED ORDER — MORPHINE SULFATE (PF) 2 MG/ML IV SOLN: 2.0000 mg | INTRAVENOUS | Status: DC | PRN

## 2015-11-04 MED ORDER — ENOXAPARIN SODIUM 40 MG/0.4ML ~~LOC~~ SOLN
40.0000 mg | SUBCUTANEOUS | Status: DC
  Administered 2015-11-05: 40 mg via SUBCUTANEOUS
  Filled 2015-11-04: qty 0.4

## 2015-11-04 MED ORDER — SODIUM CHLORIDE 0.9% FLUSH
3.0000 mL | Freq: Two times a day (BID) | INTRAVENOUS | Status: DC
  Administered 2015-11-04 – 2015-11-05 (×3): 3 mL via INTRAVENOUS

## 2015-11-04 MED FILL — Heparin Sodium (Porcine) Inj 1000 Unit/ML: INTRAMUSCULAR | Qty: 10 | Status: AC

## 2015-11-04 NOTE — Progress Notes (Signed)
Echocardiogram 2D Echocardiogram has been performed.  Tresa Res 11/04/2015, 11:24 AM

## 2015-11-04 NOTE — Care Management Note (Signed)
Case Management Note  Patient Details  Name: CORTNE KOWALKE MRN: [id number] Date of Birth: [birth year]  Subjective/Objective:       Adm w mi             Action/Plan: lives w husband, pcp dr tower   Expected Discharge Date:                  Expected Discharge Plan:  Home/Self Care  In-House Referral:     Discharge planning Services  CM Consult, Medication Assistance  Post Acute Care Choice:    Choice offered to:     DME Arranged:    DME Agency:     HH Arranged:    [place] Agency:     Status of Service:     Medicare Important Message Given:    Date Medicare IM Given:    Medicare IM give by:    Date Additional Medicare IM Given:    Additional Medicare Important Message give by:     If discussed at [place] of Stay Meetings, dates discussed:    Additional Comments: gave pt 30day free brilinta card and copay card. [facility] ins  Percell Locus 11/04/2015, 9:37 AM

## 2015-11-04 NOTE — Progress Notes (Signed)
CRITICAL VALUE ALERT  Critical value received:  Troponin   Date of notification:  11/04/2015  Time of notification:  2330  Critical value read back:Yes.    Nurse who received alert:  [place]  Dr. Claiborne Billings made aware.

## 2015-11-04 NOTE — Progress Notes (Signed)
Patient Name: Jennifer [name] Date of Encounter: 11/04/2015  Hospital Problem List     Principal Problem:   NSTEMI (non-ST elevated myocardial infarction) [facility]) Active Problems:   CAD (coronary artery disease)   Diabetes type 2, controlled ([place])   Morbid obesity ([place])   TOBACCO ABUSE   Essential hypertension   Hyperlipidemia LDL goal <100   Hypothyroidism    Subjective   S/p PCI/DES x 2 to the RCA overnight. No c/p or dyspnea this AM.  Inpatient Medications    . aspirin EC  81 mg Oral Daily  . atorvastatin  80 mg Oral Daily  . buPROPion  300 mg Oral QPM  . carvedilol  3.125 mg Oral BID WC  . [START ON 11/05/2015] enoxaparin (LOVENOX) injection  40 mg Subcutaneous Q24H  . insulin aspart  0-24 Units Subcutaneous Q4H  . levothyroxine  112 mcg Oral QAC breakfast  . lisinopril  5 mg Oral QPM  . [START ON 11/05/2015] pneumococcal 23 valent vaccine  0.5 mL Intramuscular Tomorrow-1000  . sodium chloride flush  3 mL Intravenous Q12H  . ticagrelor  90 mg Oral BID    Vital Signs    Filed Vitals:   11/04/15 0615 11/04/15 0750 11/04/15 0800 11/04/15 0805  BP: 168/67  152/58 138/64  Pulse:  75 77 76  Temp:  98.2 F (36.8 C)    TempSrc:  Oral    Resp: 18  23 14   Height:      Weight:      SpO2: 96%  97% 97%    Intake/Output Summary (Last 24 hours) at 11/04/15 0851 Last data filed at 11/04/15 0800  Gross per 24 hour  Intake 1617.47 ml  Output   1350 ml  Net 267.47 ml   Filed Weights   11/03/15 1725 11/04/15 0500  Weight: 284 lb (128.822 kg) 279 lb 15.8 oz (127 kg)    Physical Exam    General: Pleasant, NAD. Neuro: Alert and oriented X 3. Moves all extremities spontaneously. Psych: Normal affect. HEENT:  Normal  Neck: Supple without bruits or JVD. Lungs:  Resp regular and unlabored, diminished breath sounds with faint exp wheezing throughout. Heart: RRR no s3, s4, or murmurs. Abdomen: Soft, non-tender, non-distended, BS + x 4.  Extremities: No clubbing, cyanosis.   Trace bilat LE edema. DP/PT/Radials 2+ and equal bilaterally.  R wrist cath site w/o bleeding/bruit/hematoma.  Labs    CBC  Recent Labs  11/03/15 1720 11/04/15 0420  WBC 11.2* 8.2  NEUTROABS 7.3  --   HGB 13.4 11.5*  HCT 40.7 35.5*  MCV 82.4 80.9  PLT 240 [id number]   Basic Metabolic Panel  Recent Labs  11/03/15 2200 11/04/15 0420  NA 139 138  K 3.7 3.6  CL 103 106  CO2 29 26  GLUCOSE 163* 151*  BUN 13 10  CREATININE 0.77 0.81  CALCIUM 9.5 8.6*  MG 1.7  --    Liver Function Tests  Recent Labs  11/03/15 1720 11/03/15 2200  AST 21 32  ALT 21 20  ALKPHOS 71 61  BILITOT 0.4 0.6  PROT 6.6 5.3*  ALBUMIN 3.4* 2.8*   Cardiac Enzymes  Recent Labs  11/03/15 1720 11/03/15 2200 11/04/15 0420  TROPONINI 0.50* 2.39* 26.55*   Fasting Lipid Panel  Recent Labs  11/04/15 0420  CHOL 154  HDL 37*  LDLCALC 79  TRIG 190*  CHOLHDL 4.2   Thyroid Function Tests  Recent Labs  11/03/15 2200  TSH 5.253*  Telemetry    RSR  ECG    Rsr, 81, no acute st/t changes.  Radiology    Dg Chest Portable 1 View  11/03/2015  CLINICAL DATA:  Chest pain with shortness of breath and weakness. Left arm pain. EXAM: PORTABLE CHEST 1 VIEW COMPARISON:  05/17/2009 FINDINGS: The cardiomediastinal silhouette is within normal limits. There is mild elevation of the right hemidiaphragm. No airspace consolidation, edema, pleural effusion, or pneumothorax is identified. No acute osseous abnormality is seen. IMPRESSION: No active disease. Electronically Signed   By: Logan Bores M.D.   On: 11/03/2015 17:53    Assessment & Plan    1.  Acute NSTEMI/CAD:  S/p emergent cath last night revealing and occluded distal RCA and severe dzs in the OM2.  The RCA was successfully treated with 2 DES.  OM2 was not felt to be amenable to PCI.  Otw had moderate multivessel dzs.  No c/p or dyspnea overnight.  Trop currently 26.  F/U echo today.  Cont asa, statin,  blocker, acei, brilinta.  Wean IV ntg and  titrate  blocker.  Cardiac rehab to see.  Can likely be tx to stepdown today - ? Home tomorrow.  2.  Essential HTN:  BP elevated post-cath.  Wean IV ntg and titrate  blocker.  3.  HL:  LDL 79.  LFT's ok.  Cont high potency statin.  4.  Type II DM:  Cont SSI.  5.  Morbid Obesity:  Will need dietary counseling.  Cardiac rehab to see.  6.  Hypothyroidism:  TSH mildly elevated - ? Sick euthyroid.  Cont synthroid.  7.  Tob Abuse:  Complete cessation advised.  She's contemplating.  Signed, Murray Hodgkins NP   Agree with note by Ignacia Bayley RNP  POD #0 Inf STEMI Rx with DES to distal RCA. Residual non critical CAD Rx medically. No CP/SOB. Exam benign. On DAPT and approp meds otherwise. CRF mod. CRH. Tx step down. Fast track. Home Thurs/Fri   Lorretta Harp, M.D., FACP, [facility], [facility] [place] [facility] [address] [place], [place]  [address]  [phone] 11/04/2015 9:12 AM

## 2015-11-04 NOTE — Progress Notes (Signed)
CARDIAC REHAB PHASE I   PRE:  Rate/Rhythm: 86 SR  BP:  Sitting: 121/60        SaO2: 98 RA  MODE:  Ambulation: 270 ft   POST:  Rate/Rhythm: 96 SR  BP:  Sitting: 131/92         SaO2: 95 RA  Pt ambulated 270 ft on RA, handheld assist, steady gait, tolerated well, no complaints other than feeling "tired." Completed MI/stent education with pt, pt's husband and daughters at bedside.  Reviewed risk factors, tobacco cessation (gave pt fake cigarette), MI book, anti-platelet therapy, stent card, activity restrictions, ntg, exercise, heart healthy diet, carb counting, portion control, sodium restrictions and phase 2 cardiac rehab. Pt verbalized understanding, flat affect. Pt agrees to phase 2 cardiac rehab referral, will send to [facility] per pt request. Pt to recliner after walk, family at bedside, call bell within reach. Will follow.  [id number] Lenna Sciara, RN, BSN 11/04/2015 12:06 PM

## 2015-11-05 ENCOUNTER — Encounter (HOSPITAL_COMMUNITY): Payer: Self-pay | Admitting: Physician Assistant

## 2015-11-05 DIAGNOSIS — F17200 Nicotine dependence, unspecified, uncomplicated: Secondary | ICD-10-CM

## 2015-11-05 LAB — GLUCOSE, CAPILLARY
Glucose-Capillary: 196 mg/dL — ABNORMAL HIGH (ref 65–99)
Glucose-Capillary: 248 mg/dL — ABNORMAL HIGH (ref 65–99)

## 2015-11-05 LAB — CBC
HEMATOCRIT: 39.9 % (ref 36.0–46.0)
Hemoglobin: 12.5 g/dL (ref 12.0–15.0)
MCH: 26.2 pg (ref 26.0–34.0)
MCHC: 31.3 g/dL (ref 30.0–36.0)
MCV: 83.6 fL (ref 78.0–100.0)
PLATELETS: 216 10*3/uL (ref 150–400)
RBC: 4.77 MIL/uL (ref 3.87–5.11)
RDW: 14.7 % (ref 11.5–15.5)
WBC: 8.5 10*3/uL (ref 4.0–10.5)

## 2015-11-05 LAB — HEMOGLOBIN A1C
Hgb A1c MFr Bld: 9.1 % — ABNORMAL HIGH (ref 4.8–5.6)
MEAN PLASMA GLUCOSE: 214 mg/dL

## 2015-11-05 MED ORDER — TICAGRELOR 90 MG PO TABS: 90.0000 mg | ORAL_TABLET | Freq: Two times a day (BID) | ORAL | Status: DC

## 2015-11-05 MED ORDER — CARVEDILOL 12.5 MG PO TABS: 12.5000 mg | ORAL_TABLET | Freq: Two times a day (BID) | ORAL | Status: DC

## 2015-11-05 MED ORDER — NITROGLYCERIN 0.4 MG SL SUBL: 0.4000 mg | SUBLINGUAL_TABLET | SUBLINGUAL | Status: DC | PRN

## 2015-11-05 MED ORDER — ATORVASTATIN CALCIUM 80 MG PO TABS: 80.0000 mg | ORAL_TABLET | Freq: Every day | ORAL | Status: DC

## 2015-11-05 MED ORDER — ASPIRIN 81 MG PO TBEC: 81.0000 mg | DELAYED_RELEASE_TABLET | Freq: Every day | ORAL | Status: DC

## 2015-11-05 NOTE — Progress Notes (Signed)
CARDIAC REHAB PHASE I   PRE:  Rate/Rhythm: 85 SR    BP: sitting 162/62    SaO2:   MODE:  Ambulation: 300 ft   POST:  Rate/Rhythm: 107 ST    BP: sitting 206/86     SaO2:   Pt fairly steady however increasingly SOB with distance. BP elevated. Seems deconditioned generally. Had to hurry to BR after walk. Encouraged her to make a plan to quit smoking and ex more.  [place], ACSM 11/05/2015 8:56 AM

## 2015-11-05 NOTE — Progress Notes (Signed)
Insurance check completed  S/W ROXIANNA @ [facility] # [phone]   BRILINTA 90 MG BID ( 30)   COVER- YES  CO-PAY- $ 18.00   60 TAB  PRIOR APPROVAL- NO  [facility] : [facility]   MAIL-ORDER FOR 90 DAY SUPPLY $36.00    Last edited by Lacretia Leigh, RN on 11/05/15 at [phone]   5-24 gave pt 30day free and copay card for brilinta. [facility] ins.

## 2015-11-05 NOTE — Progress Notes (Signed)
Patient Name: Jennifer [name] Date of Encounter: 11/05/2015  Primary Cardiologist: Linna Hoff   Patient profile:  Ms. Pruski is a 60 yo woman with PMH of T2DM, hypertension, dyslipidemia and continued tobacco use who presented to [facility] with chest discomfort that began at 4 am today while she was working third shift.    Principal Problem:   NSTEMI (non-ST elevated myocardial infarction) ([place]) Active Problems:   Diabetes type 2, controlled ([place])   Hyperlipidemia LDL goal <100   Morbid obesity ([place])   TOBACCO ABUSE   Essential hypertension   Hypothyroidism   CAD (coronary artery disease)    SUBJECTIVE  Denies any CP or SOB.   CURRENT MEDS . aspirin EC  81 mg Oral Daily  . atorvastatin  80 mg Oral Daily  . buPROPion  300 mg Oral QPM  . carvedilol  6.25 mg Oral BID WC  . enoxaparin (LOVENOX) injection  40 mg Subcutaneous Q24H  . insulin aspart  0-24 Units Subcutaneous TID WC & HS  . levothyroxine  112 mcg Oral QAC breakfast  . lisinopril  5 mg Oral QPM  . sodium chloride flush  3 mL Intravenous Q12H  . ticagrelor  90 mg Oral BID    OBJECTIVE  Filed Vitals:   11/04/15 2124 11/05/15 0300 11/05/15 0837 11/05/15 0927  BP: 167/81 152/68 162/62 159/87  Pulse: 93 91 89 82  Temp: 98.4 F (36.9 C) 98.5 F (36.9 C)    TempSrc: Oral Oral    Resp: 19 20    Height:      Weight:  275 lb 12.8 oz (125.102 kg)    SpO2: 91% 93%      Intake/Output Summary (Last 24 hours) at 11/05/15 1219 Last data filed at 11/05/15 0843  Gross per 24 hour  Intake  603.3 ml  Output      0 ml  Net  603.3 ml   Filed Weights   11/03/15 1725 11/04/15 0500 11/05/15 0300  Weight: 284 lb (128.822 kg) 279 lb 15.8 oz (127 kg) 275 lb 12.8 oz (125.102 kg)    PHYSICAL EXAM  General: Pleasant, NAD. Neuro: Alert and oriented X 3. Moves all extremities spontaneously. Psych: Normal affect. HEENT:  Normal  Neck: Supple without bruits or JVD. Lungs:  Resp regular and unlabored, CTA. Heart: RRR no  s3, s4, or murmurs. Abdomen: Soft, non-tender, non-distended, BS + x 4.  Extremities: No clubbing, cyanosis or edema. DP/PT/Radials 2+ and equal bilaterally.  Accessory Clinical Findings  CBC  Recent Labs  11/03/15 1720 11/04/15 0420 11/05/15 0325  WBC 11.2* 8.2 8.5  NEUTROABS 7.3  --   --   HGB 13.4 11.5* 12.5  HCT 40.7 35.5* 39.9  MCV 82.4 80.9 83.6  PLT 240 190 [id number]   Basic Metabolic Panel  Recent Labs  11/03/15 2200 11/04/15 0420  NA 139 138  K 3.7 3.6  CL 103 106  CO2 29 26  GLUCOSE 163* 151*  BUN 13 10  CREATININE 0.77 0.81  CALCIUM 9.5 8.6*  MG 1.7  --    Liver Function Tests  Recent Labs  11/03/15 1720 11/03/15 2200  AST 21 32  ALT 21 20  ALKPHOS 71 61  BILITOT 0.4 0.6  PROT 6.6 5.3*  ALBUMIN 3.4* 2.8*   Cardiac Enzymes  Recent Labs  11/04/15 0420 11/04/15 1129 11/04/15 1627  TROPONINI 26.55* 15.57* 9.89*   Hemoglobin A1C  Recent Labs  11/03/15 2222  HGBA1C 9.1*   Fasting Lipid Panel  Recent Labs  11/04/15 0420  CHOL 154  HDL 37*  LDLCALC 79  TRIG 190*  CHOLHDL 4.2   Thyroid Function Tests  Recent Labs  11/03/15 2200  TSH 5.253*    TELE NSR without significant ventricular ectopy    ECG  NSR without significant ST-T wave changes, persistent elevated J point in inferior leads  Echocardiogram 11/04/2015  LV EF: 60% - 65%  ------------------------------------------------------------------- Indications: Chest pain 786.51.  ------------------------------------------------------------------- History: Risk factors: Current tobacco use. Hypertension. Diabetes mellitus. Hypercholesterolemia.  ------------------------------------------------------------------- Study Conclusions  - Left ventricle: The cavity size was normal. Wall thickness was  increased in a pattern of mild LVH. Systolic function was normal.  The estimated ejection fraction was in the range of 60% to 65%.  Wall motion was normal;  there were no regional wall motion  abnormalities. Doppler parameters are consistent with abnormal  left ventricular relaxation (grade 1 diastolic dysfunction). - Pulmonary arteries: Systolic pressure was mildly increased. PA  peak pressure: 32 mm Hg (S).    Radiology/Studies  Dg Chest Portable 1 View  11/03/2015  CLINICAL DATA:  Chest pain with shortness of breath and weakness. Left arm pain. EXAM: PORTABLE CHEST 1 VIEW COMPARISON:  05/17/2009 FINDINGS: The cardiomediastinal silhouette is within normal limits. There is mild elevation of the right hemidiaphragm. No airspace consolidation, edema, pleural effusion, or pneumothorax is identified. No acute osseous abnormality is seen. IMPRESSION: No active disease. Electronically Signed   By: Logan Bores M.D.   On: 11/03/2015 17:53    ASSESSMENT AND PLAN  1. NSTEMI s/p emergent cath 5/23 100% dist RCA lesion treated with 2 overlapping Integrity Resolute DES ( 2.25x85mm, 2.25x32mm), 60% mid RCA treated medically, 99% OM2 not amenable to PCI, 70% D1 lesion, EF normal  - Echo 11/04/2015 EF 60-65%, grade 1 DD, no RWMA, PA peak pressure 32mmHg  - post cath trop went up to 26, currently trending down.  - Continue ASA, brilinta. Discharge either today or tomorrow with 7 day TCM.   2. HTN: continue coreg, lisinopril. Increase coreg to 12.5mg  BID. Will not add Imdur at this time, patient says she had a really bad headache from nitro. If she has any CP later, add amlodipine +/- ranexa.   3. DM: hgb A1C 9.1, need better control  4. HLD: chol 154, trig 190, HDL 37, LDL 79, continue high dose statin  5. Morbid obesity  6. Mild elevated TSH: on synthroid  7. Tobacco abuse: counseled on cessation  Signed, Almyra Deforest PA-C Pager: [phone]  Agree with note by Almyra Deforest PA-C  POD 1 NSTEMI RCA with residual CAD. Right radial puncture site OK. Exam benign. LAbs OK. DAPT. Discussed the importance of medication compliance and CRF mod. OK for DC home. Can  F/U in [place].  Lorretta Harp, M.D., [place], [facility], Laverta [place] [address] [place], [place]  [address]  [phone] 11/05/2015 1:46 PM

## 2015-11-05 NOTE — Discharge Instructions (Signed)
No driving for 24 hours. No lifting over 5 lbs for 1 week. No sexual activity for 1 week. You may return to work on 11/16/2015. Keep procedure site clean & dry. If you notice increased pain, swelling, bleeding or pus, call/return!  You may shower, but no soaking baths/hot tubs/pools for 1 week.   Your TSH was mildly elevated at 5.25, this will need to be monitored by your primary care doctor. Also your Hemoglobin A1C was 9.1 indicating uncontrolled diabetes. This will need to be followed by your primary care doctor as well.

## 2015-11-05 NOTE — Discharge Summary (Signed)
Discharge Summary    Patient ID: Jennifer [name],  MRN: [id number], DOB/AGE: September 28, 1955 60 y.o.  Admit date: 11/03/2015 Discharge date: 11/05/2015  Primary Care Provider: Loura Pardon Primary Cardiologist: Linna Hoff  Discharge Diagnoses    Principal Problem:   NSTEMI (non-ST elevated myocardial infarction) [facility]) Active Problems:   Diabetes type 2, controlled ([place])   Hyperlipidemia LDL goal <100   Morbid obesity ([place])   TOBACCO ABUSE   Essential hypertension   Hypothyroidism   CAD (coronary artery disease)   Allergies No Known Allergies  Diagnostic Studies/Procedures    Cath 11/03/2015 Conclusion     Dist RCA lesion, 100% stenosed. Post intervention - PTCA followed by PCI with 2 overlapping Integrity Resolute DES Stents (2.25 mm x 14 mm, 2.25 mm x 12 mm), there is a 0% residual stenosis.  Mid RCA lesion, 60% stenosed. Not flow-limiting. The decision was made to treat this medically.  2nd Mrg lesion, 99% stenosed. Prior to visualization of the distal RCA occlusion, this was thought to be a potential culprit, however not PCI amenable.  Lat 1st Mrg lesion, 55% stenosed.  1st Diag lesion, 70% stenosed.  Preserved LVEF with normal LVEDP  The left ventricular systolic function is normal.   Successful PCI of the culprit 100% occlusion of the distal RCA into the PDA with 2 overlapping DES stents. Otherwise patient has diffuse moderate to severe disease throughout consistent with long-standing diabetes.  Recommendation would be medical management of the remainder these lesions on the symptoms warrant.  Plan: Admit to CCU for overnight monitoring. TR band removal per protocol. Continue Angiomax infusion at current rate until bag complete. Wean IV nitroglycerin overnight. Aggressive blood pressure management along with diabetes control -- beta blocker plus minus ACE inhibitor High-dose statin Smoking cessation counseling provided.  Depending on blood pressure  control and stability, the patient would likely be okay for fast-track discharge by Friday, May 26.  As she presented via transfer from [facility], we will need to determine whether she would be better suited with follow-up in [place], [place] versus [place].    Echo 11/04/2015 LV EF: 60% - 65%  ------------------------------------------------------------------- Indications: Chest pain 786.51.  ------------------------------------------------------------------- History: Risk factors: Current tobacco use. Hypertension. Diabetes mellitus. Hypercholesterolemia.  ------------------------------------------------------------------- Study Conclusions  - Left ventricle: The cavity size was normal. Wall thickness was  increased in a pattern of mild LVH. Systolic function was normal.  The estimated ejection fraction was in the range of 60% to 65%.  Wall motion was normal; there were no regional wall motion  abnormalities. Doppler parameters are consistent with abnormal  left ventricular relaxation (grade 1 diastolic dysfunction). - Pulmonary arteries: Systolic pressure was mildly increased. PA  peak pressure: 32 mm Hg (S).   _____________   History of Present Illness     Jennifer [name] is a 60 yo woman with PMH of T2DM, hypertension, dyslipidemia and continued tobacco use who presented to [name] with chest discomfort that began at 4 am today while she was working third shift. She had sudden onset of chest discomfort that localized to the middle to left-side of her chest with some radiation into her bilateral shoulders and back. She characterized the pain as tightness with severity as bad as 7/10. Her symptoms have no associated dyspnea but she did have some brief jaw pain. She denies prior MI/coronary artery disease, no heart failure symptoms or edema/swelling. She says she doesn't walk a lot but she denies exercise intolerance or chest pain with activity  at home. 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   Her ECG was mildly abnormal and troponin noted to be 0.5 and she was started on heparin bolus + gtt, asa 324 mg and arranged transfer to [facility]. She was started on NTG gtt with improved chest discomfort but only down to 2/10 currently on 70 mcg/kg/min. Repeat ECG with subtle ST 1/2 mm ST elevation.   No upcoming surgeries, she is interested in quitting smoking, no bleeding issues.   Hospital Course     Due to EKG changes, she was taken emergently to the cath lab on 5/23 which showed 100% dist RCA lesion treated with 2 overlapping Integrity Resolute DES ( 2.25x42mm, 2.25x70mm), 60% mid RCA treated medically, 99% OM2 not amenable to PCI, 70% D1 lesion, EF normal. Echocardiogram obtained on the following day showed EF 60-65%, grade 1 DD, no RWMA, PA peak pressure 24mmHg. Post cath her trop went up to 26 before coming down. She was placed on ASA and brilinta. Her Hgb A1C was 9.1. Lipid panel obtained showed trig 190, chol 154, LDL 79, HDL 37. She was placed on high dose statin. Her TSH was mildly elevated at 5.25, this will need to be followed as outpatient.   She was seen in the 5/25 at which time she denies any CP or SOB. Emphasis has been placed on compliance with DAPT and tobacco cessation. She has been seen by cardiac rehab and ambulated without discomfort. She is deemed stable for discharge from cardiac perspective. Outpatient cardiac referral made. I have arranged 1 week TCM followup in our [place] office.   _____________  Discharge Vitals Blood pressure 159/87, pulse 82, temperature 98.5 F (36.9 C), temperature source Oral, resp. rate 20, height 5\' 11"  (1.803 m), weight 275 lb 12.8 oz (125.102 kg), SpO2 93 %.  Filed Weights   11/03/15 1725 11/04/15 0500 11/05/15 0300  Weight: 284 lb (128.822 kg) 279 lb 15.8 oz (127 kg) 275 lb 12.8 oz (125.102 kg)    Labs & Radiologic Studies      CBC  Recent Labs  11/03/15 1720 11/04/15 0420 11/05/15 0325  WBC 11.2* 8.2 8.5  NEUTROABS 7.3  --   --   HGB 13.4 11.5* 12.5  HCT 40.7 35.5* 39.9  MCV 82.4 80.9 83.6  PLT 240 190 [id number]   Basic Metabolic Panel  Recent Labs  11/03/15 2200 11/04/15 0420  NA 139 138  K 3.7 3.6  CL 103 106  CO2 29 26  GLUCOSE 163* 151*  BUN 13 10  CREATININE 0.77 0.81  CALCIUM 9.5 8.6*  MG 1.7  --    Liver Function Tests  Recent Labs  11/03/15 1720 11/03/15 2200  AST 21 32  ALT 21 20  ALKPHOS 71 61  BILITOT 0.4 0.6  PROT 6.6 5.3*  ALBUMIN 3.4* 2.8*   Cardiac Enzymes  Recent Labs  11/04/15 0420 11/04/15 1129 11/04/15 1627  TROPONINI 26.55* 15.57* 9.89*   Hemoglobin A1C  Recent Labs  11/03/15 2222  HGBA1C 9.1*   Fasting Lipid Panel  Recent Labs  11/04/15 0420  CHOL 154  HDL 37*  LDLCALC 79  TRIG 190*  CHOLHDL 4.2   Thyroid Function Tests  Recent Labs  11/03/15 2200  TSH 5.253*    Dg Chest Portable 1 View  11/03/2015  CLINICAL DATA:  Chest pain with shortness of breath and weakness. Left arm pain. EXAM: PORTABLE CHEST 1 VIEW COMPARISON:  05/17/2009 FINDINGS:  The cardiomediastinal silhouette is within normal limits. There is mild elevation of the right hemidiaphragm. No airspace consolidation, edema, pleural effusion, or pneumothorax is identified. No acute osseous abnormality is seen. IMPRESSION: No active disease. Electronically Signed   By: Logan Bores M.D.   On: 11/03/2015 17:53    Disposition   Pt is being discharged home today in good condition.  Follow-up Plans & Appointments    Follow-up Information    Follow up with Jory Sims, NP On 11/12/2015.   Specialties:  Nurse Practitioner, Radiology, Cardiology   Why:  3:10PM. Cardiology followup.   Contact information:   [address] [place] [address] [phone]       Schedule an appointment as soon as possible for a visit with Loura Pardon, MD.   Specialties:  Family Medicine,  Radiology   Contact information:   [address] [place] [address] [phone]      Discharge Instructions    Amb Referral to Cardiac Rehabilitation    Complete by:  As directed   Diagnosis:   NSTEMI Coronary Stents             Discharge Medications   Current Discharge Medication List    START taking these medications   Details  aspirin EC 81 MG EC tablet Take 1 tablet (81 mg total) by mouth daily.    carvedilol (COREG) 12.5 MG tablet Take 1 tablet (12.5 mg total) by mouth 2 (two) times daily with a meal. Qty: 60 tablet, Refills: 11    nitroGLYCERIN (NITROSTAT) 0.4 MG SL tablet Place 1 tablet (0.4 mg total) under the tongue every 5 (five) minutes x 3 doses as needed for chest pain. Qty: 25 tablet, Refills: 3    ticagrelor (BRILINTA) 90 MG TABS tablet Take 1 tablet (90 mg total) by mouth 2 (two) times daily. Qty: 180 tablet, Refills: 3      CONTINUE these medications which have CHANGED   Details  atorvastatin (LIPITOR) 80 MG tablet Take 1 tablet (80 mg total) by mouth daily. Qty: 90 tablet, Refills: 3      CONTINUE these medications which have NOT CHANGED   Details  buPROPion (WELLBUTRIN SR) 150 MG 12 hr tablet Take 1 tablet (150 mg total) by mouth 2 (two) times daily. Qty: 60 tablet, Refills: 11    diclofenac (VOLTAREN) 75 MG EC tablet Take 1 tablet (75 mg total) by mouth 2 (two) times daily with a meal. Qty: 60 tablet, Refills: 2    DULoxetine (CYMBALTA) 60 MG capsule TAKE ONE CAPSULE BY MOUTH ONCE DAILY Qty: 30 capsule, Refills: 11    furosemide (LASIX) 20 MG tablet TAKE ONE TABLET BY MOUTH ONCE DAILY Qty: 30 tablet, Refills: 11    HYDROcodone-acetaminophen (NORCO) 7.5-325 MG tablet Take 1 tablet by mouth every 4 (four) hours as needed for moderate pain (Must last 30 days.  Do not drive or operate machinery while taking this medicine.). Qty: 120 tablet, Refills: 0    insulin lispro (HUMALOG) 100 UNIT/ML injection  Inject 75 Units into the skin 2 (two) times daily.     levothyroxine (SYNTHROID, LEVOTHROID) 112 MCG tablet Take 112 mcg by mouth every evening.     lisinopril (PRINIVIL,ZESTRIL) 5 MG tablet Take 1 tablet (5 mg total) by mouth daily. Qty: 30 tablet, Refills: 11    sitaGLIPtin (JANUVIA) 100 MG tablet Take 100 mg by mouth every evening.     tiZANidine (ZANAFLEX) 4 MG tablet One by mouth  every 8 hours as needed for spasm Qty: 90 tablet, Refills: 3         Aspirin prescribed at discharge?  Yes High Intensity Statin Prescribed? (Lipitor 40-80mg  or Crestor 20-40mg ): Yes Beta Blocker Prescribed? Yes For EF 45% or less, Was ACEI/ARB Prescribed? No: EF normal ADP Receptor Inhibitor Prescribed? (i.e. Plavix etc.-Includes Medically Managed Patients): Yes For EF <40%, Aldosterone Inhibitor Prescribed? No: EF normal Was EF assessed during THIS hospitalization? Yes Was Cardiac Rehab II ordered? (Included Medically managed Patients): Yes   Outstanding Labs/Studies   None  Duration of Discharge Encounter   Greater than 30 minutes including physician time.  Signed, Almyra Deforest PA-C 11/05/2015, 2:24 PM

## 2015-11-05 NOTE — Progress Notes (Signed)
Patient discharged home with family. Discharge instructions were educated on and they stated that they understood. IV was dc'd and was intact.

## 2015-11-06 ENCOUNTER — Telehealth: Payer: Self-pay

## 2015-11-06 NOTE — Telephone Encounter (Signed)
-----   Message from Susa Griffins sent at 11/05/2015  2:12 PM EDT ----- Regarding: 7 day TOC for Jennifer [name] Please set a reminder to call this patient for [facility] care and verify that she will be at her 7 day f/u on June 1st.  Thanks!

## 2015-11-06 NOTE — Telephone Encounter (Signed)
Patient contacted regarding discharge from [facility] on 11/05/15.  Patient understands to follow up with provider  Purcell Nails Np on 11/12/15 at 310 pm at [place]. Patient understands discharge instructions? yes Patient understands medications and regiment? yes Patient understands to bring all medications to this visit? yes

## 2015-11-12 ENCOUNTER — Ambulatory Visit (INDEPENDENT_AMBULATORY_CARE_PROVIDER_SITE_OTHER): Payer: Commercial Managed Care - HMO | Admitting: Adult Health

## 2015-11-12 ENCOUNTER — Encounter: Payer: Self-pay | Admitting: Adult Health

## 2015-11-12 VITALS — BP 142/78 | HR 75 | Ht 67.0 in | Wt 279.0 lb

## 2015-11-12 DIAGNOSIS — I10 Essential (primary) hypertension: Secondary | ICD-10-CM | POA: Diagnosis not present

## 2015-11-12 DIAGNOSIS — I2510 Atherosclerotic heart disease of native coronary artery without angina pectoris: Secondary | ICD-10-CM | POA: Diagnosis not present

## 2015-11-12 NOTE — Progress Notes (Deleted)
Name: Jennifer [name]    DOB: [birth year]  Age: 60 y.o.  MR#: [id number]       PCP:  Loura Pardon, MD      Insurance: Payor: [employer] / Plan: [facility] / Product Type: *No Product type* /   CC:   No chief complaint on file.   VS Filed Vitals:   11/12/15 1448  BP: 142/78  Pulse: 75  Height: 5\' 7"  (1.702 m)  Weight: 279 lb (126.554 kg)  SpO2: 93%    Weights Current Weight  11/12/15 279 lb (126.554 kg)  11/05/15 275 lb 12.8 oz (125.102 kg)  09/17/15 283 lb (128.368 kg)    Blood Pressure  BP Readings from Last 3 Encounters:  11/12/15 142/78  11/05/15 159/87  09/17/15 174/99     Admit date:  (Not on file) Last encounter with RMR:  Visit date not found   Allergy Review of patient's allergies indicates no known allergies.  Current Outpatient Prescriptions  Medication Sig Dispense Refill  . aspirin EC 81 MG EC tablet Take 1 tablet (81 mg total) by mouth daily.    Marland Kitchen atorvastatin (LIPITOR) 80 MG tablet Take 1 tablet (80 mg total) by mouth daily. 90 tablet 3  . buPROPion (WELLBUTRIN SR) 150 MG 12 hr tablet Take 1 tablet (150 mg total) by mouth 2 (two) times daily. (Patient taking differently: Take 300 mg by mouth every evening. ) 60 tablet 11  . carvedilol (COREG) 12.5 MG tablet Take 1 tablet (12.5 mg total) by mouth 2 (two) times daily with a meal. 60 tablet 11  . diclofenac (VOLTAREN) 75 MG EC tablet Take 1 tablet (75 mg total) by mouth 2 (two) times daily with a meal. (Patient taking differently: Take 75 mg by mouth every evening. ) 60 tablet 2  . DULoxetine (CYMBALTA) 60 MG capsule TAKE ONE CAPSULE BY MOUTH ONCE DAILY (Patient taking differently: TAKE ONE CAPSULE BY MOUTH ONCE DAILY IN THE EVENING) 30 capsule 11  . furosemide (LASIX) 20 MG tablet TAKE ONE TABLET BY MOUTH ONCE DAILY (Patient taking differently: TAKE ONE TABLET BY MOUTH ONCE DAILY IN THE EVENING) 30 tablet 11  . HYDROcodone-acetaminophen (NORCO) 7.5-325 MG tablet Take 1 tablet by mouth every 4  (four) hours as needed for moderate pain (Must last 30 days.  Do not drive or operate machinery while taking this medicine.). 120 tablet 0  . insulin lispro (HUMALOG) 100 UNIT/ML injection Inject 75 Units into the skin 2 (two) times daily.     Marland Kitchen levothyroxine (SYNTHROID, LEVOTHROID) 112 MCG tablet Take 112 mcg by mouth every evening.     Marland Kitchen lisinopril (PRINIVIL,ZESTRIL) 5 MG tablet Take 1 tablet (5 mg total) by mouth daily. (Patient taking differently: Take 5 mg by mouth every evening. ) 30 tablet 11  . nitroGLYCERIN (NITROSTAT) 0.4 MG SL tablet Place 1 tablet (0.4 mg total) under the tongue every 5 (five) minutes x 3 doses as needed for chest pain. 25 tablet 3  . sitaGLIPtin (JANUVIA) 100 MG tablet Take 100 mg by mouth every evening.     . ticagrelor (BRILINTA) 90 MG TABS tablet Take 1 tablet (90 mg total) by mouth 2 (two) times daily. 180 tablet 3  . tiZANidine (ZANAFLEX) 4 MG tablet One by mouth every 8 hours as needed for spasm 90 tablet 3   No current facility-administered medications for this visit.    Discontinued Meds:   There are no discontinued medications.  Patient Active Problem List   Diagnosis  Date Noted  . CAD (coronary artery disease) 11/04/2015  . NSTEMI (non-ST elevated myocardial infarction) ([place]) 11/03/2015  . Skin infection 05/05/2015  . Viral URI with cough 04/02/2014  . Yeast vaginitis 11/04/2011  . LEUKOCYTOSIS 08/16/2010  . Hypothyroidism 07/06/2010  . BREAST MASS, RIGHT 07/06/2010  . Hyperlipidemia LDL goal <100 08/28/2008  . Essential hypertension 11/13/2007  . Diabetes type 2, controlled ([place]) 09/21/2006  . DIABETIC PERIPHERAL NEUROPATHY 09/21/2006  . Morbid obesity ([place]) 09/21/2006  . ANXIETY 09/21/2006  . TOBACCO ABUSE 09/21/2006  . Adjustment disorder with mixed anxiety and depressed mood 09/21/2006  . CARPAL TUNNEL SYNDROME 09/21/2006  . COPD 09/21/2006  . EDEMA 09/21/2006  . MIGRAINES, HX OF 09/21/2006    LABS    Component Value Date/Time   NA  138 11/04/2015 0420   NA 139 11/03/2015 2200   NA 138 11/03/2015 1720   K 3.6 11/04/2015 0420   K 3.7 11/03/2015 2200   K 3.6 11/03/2015 1720   CL 106 11/04/2015 0420   CL 103 11/03/2015 2200   CL 101 11/03/2015 1720   CO2 26 11/04/2015 0420   CO2 29 11/03/2015 2200   CO2 30 11/03/2015 1720   GLUCOSE 151* 11/04/2015 0420   GLUCOSE 163* 11/03/2015 2200   GLUCOSE 131* 11/03/2015 1720   BUN 10 11/04/2015 0420   BUN 13 11/03/2015 2200   BUN 17 11/03/2015 1720   CREATININE 0.81 11/04/2015 0420   CREATININE 0.77 11/03/2015 2200   CREATININE 0.77 11/03/2015 1720   CALCIUM 8.6* 11/04/2015 0420   CALCIUM 9.5 11/03/2015 2200   CALCIUM 9.3 11/03/2015 1720   GFRNONAA >60 11/04/2015 0420   GFRNONAA >60 11/03/2015 2200   GFRNONAA >60 11/03/2015 1720   GFRAA >60 11/04/2015 0420   GFRAA >60 11/03/2015 2200   GFRAA >60 11/03/2015 1720   CMP     Component Value Date/Time   NA 138 11/04/2015 0420   K 3.6 11/04/2015 0420   CL 106 11/04/2015 0420   CO2 26 11/04/2015 0420   GLUCOSE 151* 11/04/2015 0420   BUN 10 11/04/2015 0420   CREATININE 0.81 11/04/2015 0420   CALCIUM 8.6* 11/04/2015 0420   PROT 5.3* 11/03/2015 2200   ALBUMIN 2.8* 11/03/2015 2200   AST 32 11/03/2015 2200   ALT 20 11/03/2015 2200   ALKPHOS 61 11/03/2015 2200   BILITOT 0.6 11/03/2015 2200   GFRNONAA >60 11/04/2015 0420   GFRAA >60 11/04/2015 0420       Component Value Date/Time   WBC 8.5 11/05/2015 0325   WBC 8.2 11/04/2015 0420   WBC 11.2* 11/03/2015 1720   HGB 12.5 11/05/2015 0325   HGB 11.5* 11/04/2015 0420   HGB 13.4 11/03/2015 1720   HCT 39.9 11/05/2015 0325   HCT 35.5* 11/04/2015 0420   HCT 40.7 11/03/2015 1720   MCV 83.6 11/05/2015 0325   MCV 80.9 11/04/2015 0420   MCV 82.4 11/03/2015 1720    Lipid Panel     Component Value Date/Time   CHOL 154 11/04/2015 0420   TRIG 190* 11/04/2015 0420   HDL 37* 11/04/2015 0420   CHOLHDL 4.2 11/04/2015 0420   VLDL 38 11/04/2015 0420   LDLCALC 79  11/04/2015 0420   LDLDIRECT 154.2 11/04/2011 1018    ABG No results found for: PHART, PCO2ART, PO2ART, HCO3, TCO2, ACIDBASEDEF, O2SAT   Lab Results  Component Value Date   TSH 5.253* 11/03/2015   BNP (last 3 results)  Recent Labs  11/03/15 2222  BNP 62.8  ProBNP (last 3 results) No results for input(s): PROBNP in the last 8760 hours.  Cardiac Panel (last 3 results) No results for input(s): CKTOTAL, CKMB, TROPONINI, RELINDX in the last 72 hours.  Iron/TIBC/Ferritin/ %Sat No results found for: IRON, TIBC, FERRITIN, IRONPCTSAT   EKG Orders placed or performed during the hospital encounter of 11/03/15  . EKG 12-Lead  . EKG 12-Lead  . ED EKG  . ED EKG  . EKG 12-Lead  . EKG 12-Lead  . Repeat EKG  . Repeat EKG  . EKG 12-Lead  . EKG 12-Lead  . EKG 12-Lead  . EKG 12-Lead  . EKG 12-Lead  . EKG 12-Lead  . EKG 12-Lead  . EKG 12-Lead  . EKG 12-Lead  . EKG 12-Lead  . EKG 12-Lead immediately post procedure  . EKG 12-Lead  . EKG 12-Lead  . EKG 12-Lead  . EKG 12-Lead immediately post procedure  . EKG 12-Lead  . EKG  . EKG 12-Lead     Prior Assessment and Plan Problem List as of 11/12/2015      Cardiovascular and Mediastinum   Essential hypertension   Last Assessment & Plan 05/05/2015 Office Visit Written 05/08/2015  4:56 PM by Abner Greenspan, MD    bp in fair control at this time  BP Readings from Last 1 Encounters:  05/05/15 136/74   No changes needed Disc lifstyle change with low sodium diet and exercise  req last labs from Dr Chalmers Cater       NSTEMI (non-ST elevated myocardial infarction) [facility])   CAD (coronary artery disease)     Respiratory   COPD   Viral URI with cough   Last Assessment & Plan 04/02/2014 Office Visit Written 04/03/2014  9:54 AM by Abner Greenspan, MD    Disc symptomatic care - see instructions on AVS  Since she is a smoker - given zpack to hold and fill if respsymptoms worsen Counseled to quit smoking Update if not starting to improve in  a week or if worsening          Endocrine   Diabetes type 2, controlled [facility])   Last Assessment & Plan 05/05/2015 Office Visit Written 05/08/2015  4:57 PM by Abner Greenspan, MD    Sees Dr Chalmers Cater /endocrinology Pt pt overall doing better Sent for last note and labs to reviewe       DIABETIC PERIPHERAL NEUROPATHY   Hypothyroidism   Last Assessment & Plan 02/01/2013 Office Visit Written 02/03/2013 10:06 AM by Abner Greenspan, MD    Per pt -tsh is therapeutic with recent change of med dose Per Dr Chalmers Cater No symptoms        Nervous and Auditory   CARPAL TUNNEL SYNDROME     Musculoskeletal and Integument   Skin infection   Last Assessment & Plan 05/05/2015 Office Visit Written 05/08/2015  4:58 PM by Abner Greenspan, MD    Cellulitis (area of non fluctuant erythema and induration) on L chin  (in a diabetic) Enc soap and water cleansing and warm compresses  tx with keflex 500 tid  Update if not starting to improve in a week or if worsening          Genitourinary   Yeast vaginitis   Last Assessment & Plan 11/04/2011 Office Visit Written 11/04/2011 10:06 AM by Abner Greenspan, MD    Diflucan times one - no imp with monistat(pt recently had abx)        Other   TOBACCO ABUSE  Last Assessment & Plan 05/05/2015 Office Visit Written 05/08/2015  4:58 PM by Abner Greenspan, MD    Disc in detail risks of smoking and possible outcomes including copd, vascular/ heart disease, cancer , respiratory and sinus infections  Pt voices understanding  She is not yet ready to quit       MIGRAINES, HX OF   Hyperlipidemia LDL goal <100   Last Assessment & Plan 04/02/2014 Office Visit Written 04/03/2014  9:55 AM by Abner Greenspan, MD    Will call for lipid panel from endocrinology Disc goals for lipids and reasons to control them Rev labs with pt- from last labs in chart  Rev low sat fat diet in detail  Continue lipitor      Morbid obesity ([place])   Last Assessment & Plan 05/05/2015 Office Visit Written  05/08/2015  5:04 PM by Abner Greenspan, MD    In insulin diabetic pt  Discussed how this problem influences overall health and the risks it imposes  Reviewed plan for weight loss with lower calorie diet (via better food choices and also portion control or program like weight watchers) and exercise building up to or more than 30 minutes 5 days per week including some aerobic activity         ANXIETY   Adjustment disorder with mixed anxiety and depressed mood   Last Assessment & Plan 05/05/2015 Office Visit Written 05/08/2015  4:59 PM by Abner Greenspan, MD    Wants to increase her prozac dose - will start back on 20 and titrate to 40 as tolerated Discussed expectations of SSRI medication including time to effectiveness and mechanism of action, also poss of side effects (early and late)- including mental fuzziness, weight or appetite change, nausea and poss of worse dep or anxiety (even suicidal thoughts)  Pt voiced understanding and will stop med and update if this occurs  Reviewed stressors/ coping techniques/symptoms/ support sources/ tx options and side effects in detail today  Many stressors currently      EDEMA   BREAST MASS, RIGHT   LEUKOCYTOSIS       Imaging: Dg Chest Portable 1 View  11/03/2015  CLINICAL DATA:  Chest pain with shortness of breath and weakness. Left arm pain. EXAM: PORTABLE CHEST 1 VIEW COMPARISON:  05/17/2009 FINDINGS: The cardiomediastinal silhouette is within normal limits. There is mild elevation of the right hemidiaphragm. No airspace consolidation, edema, pleural effusion, or pneumothorax is identified. No acute osseous abnormality is seen. IMPRESSION: No active disease. Electronically Signed   By: Logan Bores M.D.   On: 11/03/2015 17:53

## 2015-11-12 NOTE — Patient Instructions (Signed)
Your physician recommends that you schedule a follow-up appointment in: 1 Month  Your physician recommends that you continue on your current medications as directed. Please refer to the Current Medication list given to you today.  If you need a refill on your cardiac medications before your next appointment, please call your pharmacy.  Thank you for choosing [facility]!    

## 2015-11-12 NOTE — Progress Notes (Signed)
Cardiology Office Note   Date:  11/12/2015   ID:  IEISHA [name], DOB 08-Jun-1956, MRN [id number]  PCP:  Loura Pardon, MD  Cardiologist:   Jory Sims, NP   No chief complaint on file.     History of Present Illness: Jennifer [name] is a 60 y.o. female who presents for Hospital followup after admission for non-ST elevation MI,with history of hypertension, CAD, ongoing tobacco abuse, diabetes, and morbid obesity.the patient was taken emergently to cardiac catheterization lab do to EKG changes and chest discomfort.   Cardiac catheterization revealed a 100% distal RCA lesion, treated with 2 overlapping integrity resolute drug-eluting stents, 6% mid RCA stenosis which will be treated medically, 99% OM 2 which was not found to be amenable to PCI, stepping percent diagonal one lesion. EF was normal, with the LV EF 66 5%, with grade 1 diastolic dysfunction.  The patient was placed on aspirin Brilinta. Hemoglobin A1c was found to be elevated to 9.1 and was to followup with primary care concerning diabetic management. She was to be referred to cardiac rehabilitation. She is here for TCM followup and to be established with cardiologist in [place]  She is here today tearful and emotional. She is having a hard time accepting that she is now cardiac patient. 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 She states she's not able to sleep during the day since being in the hospital which she believes is related to anxiety and fear concerning her new diagnosis of heart disease. She denies any bleeding bruising or recurrent chest pain  Past Medical History  Diagnosis Date  . Diabetes mellitus without complication ([place])   . Thyroid disease   . Hypercholesteremia   . Hypertension   . CAD (coronary artery disease)     cath 5/23 100% dist RCA lesion treated with 2 overlapping Integrity Resolute DES ( 2.25x18mm, 2.25x29mm), 60% mid RCA  treated medically, 99% OM2 not amenable to PCI, 70% D1 lesion, EF normal    Past Surgical History  Procedure Laterality Date  . Carpal tunnel release    . Abdominal hysterectomy    . Cardiac catheterization N/A 11/03/2015    Procedure: Left Heart Cath and Coronary Angiography;  Surgeon: Leonie Man, MD;  Location: [facility];  Service: Cardiovascular;  Laterality: N/A;  . Cardiac catheterization N/A 11/03/2015    Procedure: Coronary Stent Intervention;  Surgeon: Leonie Man, MD;  Location: [facility];  Service: Cardiovascular;  Laterality: N/A;     Current Outpatient Prescriptions  Medication Sig Dispense Refill  . aspirin EC 81 MG EC tablet Take 1 tablet (81 mg total) by mouth daily.    Marland Kitchen atorvastatin (LIPITOR) 80 MG tablet Take 1 tablet (80 mg total) by mouth daily. 90 tablet 3  . buPROPion (WELLBUTRIN SR) 150 MG 12 hr tablet Take 1 tablet (150 mg total) by mouth 2 (two) times daily. (Patient taking differently: Take 300 mg by mouth every evening. ) 60 tablet 11  . carvedilol (COREG) 12.5 MG tablet Take 1 tablet (12.5 mg total) by mouth 2 (two) times daily with a meal. 60 tablet 11  . diclofenac (VOLTAREN) 75 MG EC tablet Take 1 tablet (75 mg total) by mouth 2 (two) times daily with a meal. (Patient taking differently: Take 75 mg by mouth every evening. ) 60 tablet 2  . DULoxetine (CYMBALTA) 60 MG capsule TAKE ONE CAPSULE BY MOUTH  ONCE DAILY (Patient taking differently: TAKE ONE CAPSULE BY MOUTH ONCE DAILY IN THE EVENING) 30 capsule 11  . furosemide (LASIX) 20 MG tablet TAKE ONE TABLET BY MOUTH ONCE DAILY (Patient taking differently: TAKE ONE TABLET BY MOUTH ONCE DAILY IN THE EVENING) 30 tablet 11  . HYDROcodone-acetaminophen (NORCO) 7.5-325 MG tablet Take 1 tablet by mouth every 4 (four) hours as needed for moderate pain (Must last 30 days.  Do not drive or operate machinery while taking this medicine.). 120 tablet 0  . insulin lispro (HUMALOG) 100 UNIT/ML injection  Inject 75 Units into the skin 2 (two) times daily.     Marland Kitchen levothyroxine (SYNTHROID, LEVOTHROID) 112 MCG tablet Take 112 mcg by mouth every evening.     Marland Kitchen lisinopril (PRINIVIL,ZESTRIL) 5 MG tablet Take 1 tablet (5 mg total) by mouth daily. (Patient taking differently: Take 5 mg by mouth every evening. ) 30 tablet 11  . nitroGLYCERIN (NITROSTAT) 0.4 MG SL tablet Place 1 tablet (0.4 mg total) under the tongue every 5 (five) minutes x 3 doses as needed for chest pain. 25 tablet 3  . sitaGLIPtin (JANUVIA) 100 MG tablet Take 100 mg by mouth every evening.     . ticagrelor (BRILINTA) 90 MG TABS tablet Take 1 tablet (90 mg total) by mouth 2 (two) times daily. 180 tablet 3  . tiZANidine (ZANAFLEX) 4 MG tablet One by mouth every 8 hours as needed for spasm 90 tablet 3   No current facility-administered medications for this visit.    Allergies:   Review of patient's allergies indicates no known allergies.    Social History:  The patient  reports that she has been smoking Cigarettes.  She has been smoking about 0.25 packs per day. She has never used smokeless tobacco. She reports that she does not drink alcohol or use illicit drugs.   Family History:  The patient's family history is not on file.    ROS: All other systems are reviewed and negative. Unless otherwise mentioned in H&P    PHYSICAL EXAM: VS:  BP 142/78 mmHg  Pulse 75  Ht 5\' 7"  (1.702 m)  Wt 279 lb (126.554 kg)  BMI 43.69 kg/m2  SpO2 93% , BMI Body mass index is 43.69 kg/(m^2). GEN: Well nourished, well developed, in no acute distress HEENT: normal Neck: no JVD, carotid bruits, or masses Cardiac: RRR; no murmurs, rubs, or gallops,no edema  Respiratory:  clear to auscultation bilaterally, normal work of breathing GI: soft, nontender, nondistended, + BS. Morbidly obese MS: no deformity or atrophy Skin: warm and dry, no rash Neuro:  Strength and sensation are intact Psych: euthymic mood, full affect   Recent Labs: 11/03/2015: ALT  20; B Natriuretic Peptide 62.8; Magnesium 1.7; TSH 5.253* 11/04/2015: BUN 10; Creatinine, Ser 0.81; Potassium 3.6; Sodium 138 11/05/2015: Hemoglobin 12.5; Platelets 216    Lipid Panel    Component Value Date/Time   CHOL 154 11/04/2015 0420   TRIG 190* 11/04/2015 0420   HDL 37* 11/04/2015 0420   CHOLHDL 4.2 11/04/2015 0420   VLDL 38 11/04/2015 0420   LDLCALC 79 11/04/2015 0420   LDLDIRECT 154.2 11/04/2011 1018      Wt Readings from Last 3 Encounters:  11/12/15 279 lb (126.554 kg)  11/05/15 275 lb 12.8 oz (125.102 kg)  09/17/15 283 lb (128.368 kg)     ASSESSMENT AND PLAN:  1. Coronary artery disease: status post non-ST elevation MI.patient had distal right coronary artery lesion which was 100% stenosis, with PTCA followed by PCI  with 2 overlapping drug-eluting stents. She had been RCA lesion 60% stenosis. A decision was made to treat this medically. Second marginal had 99% stenosis was not amenable to PCI. The patient is doing well with the exception of anxiety and fear related to her newly diagnosed heart disease. She is not sleeping well and is tired all the time as a result.  I have explained to her the medications that she's taking along with her ear being very normal and reasonable based upon her new diagnosis. She is given reassurance, and then encouraged to attend cardiac rehabilitation as this will be very supportive for her.  As patient continues to have fatigue and anxiety which prevents her from working, we will write her a letter to allow her to have short-term disability until she has completed cardiac rehabilitation. She plans on completing cardiac rehabilitation and [facility] as this is close to her work. If she chooses to go on short-term disability she may be able to be transferred to the hospital here and [place]. We will see her again in one month for ongoing evaluation of her symptoms.  She will continue on dual antiplatelet therapy, carvedilol, and statin  therapy.  2. Hypertension:much better control on current medication regimen. No changes.  3. Morbid obesity:by attending cardiac rehabilitation and receiving education on diet and exercise hopefully this will motivate her to lose weight and to become more active.   Current medicines are reviewed at length with the patient today.    Labs/ tests ordered today include:  No orders of the defined types were placed in this encounter.     Disposition:   FU with 1 month to be established with cardiologist here in [place]. Signed, Jory Sims, NP  11/12/2015 2:53 PM    [facility] [address] [place], [place] [address] Phone: [phone]; Fax: [phone]

## 2015-12-17 ENCOUNTER — Encounter: Payer: Self-pay | Admitting: Orthopaedic Surgery

## 2015-12-17 ENCOUNTER — Ambulatory Visit (INDEPENDENT_AMBULATORY_CARE_PROVIDER_SITE_OTHER): Payer: Commercial Managed Care - HMO | Admitting: Orthopaedic Surgery

## 2015-12-17 VITALS — BP 160/76 | HR 79 | Temp 97.5°F | Ht 67.0 in | Wt 272.8 lb

## 2015-12-17 DIAGNOSIS — F17210 Nicotine dependence, cigarettes, uncomplicated: Secondary | ICD-10-CM | POA: Diagnosis not present

## 2015-12-17 DIAGNOSIS — I10 Essential (primary) hypertension: Secondary | ICD-10-CM | POA: Diagnosis not present

## 2015-12-17 DIAGNOSIS — M25561 Pain in right knee: Secondary | ICD-10-CM

## 2015-12-17 DIAGNOSIS — M25562 Pain in left knee: Secondary | ICD-10-CM

## 2015-12-17 MED ORDER — HYDROCODONE-ACETAMINOPHEN 7.5-325 MG PO TABS: 1.0000 | ORAL_TABLET | ORAL | Status: DC | PRN

## 2015-12-17 MED ORDER — DICLOFENAC SODIUM 3 % TD GEL
TRANSDERMAL | Status: DC
Start: 2015-12-17 — End: 2016-07-07

## 2015-12-17 NOTE — Progress Notes (Signed)
Patient [id number] Jennifer [name], female DOB:1955/12/05, 60 y.o. [id number]  Chief Complaint  Patient presents with  . Follow-up    Bilateral knee pain    HPI  Jennifer [name] is a 60 y.o. female who has bilateral knee pain.  She has no giving way, no redness, no locking.  She has swelling and popping.  She has no new trauma.  She is out of her Voltaren gel.  HPI  Body mass index is 42.72 kg/(m^2).  ROS  Review of Systems  Past Medical History  Diagnosis Date  . Diabetes mellitus without complication ([place])   . Thyroid disease   . Hypercholesteremia   . Hypertension   . CAD (coronary artery disease)     cath 5/23 100% dist RCA lesion treated with 2 overlapping Integrity Resolute DES ( 2.25x4mm, 2.25x79mm), 60% mid RCA treated medically, 99% OM2 not amenable to PCI, 70% D1 lesion, EF normal    Past Surgical History  Procedure Laterality Date  . Carpal tunnel release    . Abdominal hysterectomy    . Cardiac catheterization N/A 11/03/2015    Procedure: Left Heart Cath and Coronary Angiography;  Surgeon: Leonie Man, MD;  Location: [facility];  Service: Cardiovascular;  Laterality: N/A;  . Cardiac catheterization N/A 11/03/2015    Procedure: Coronary Stent Intervention;  Surgeon: Leonie Man, MD;  Location: [facility];  Service: Cardiovascular;  Laterality: N/A;    History reviewed. No pertinent family history.  Social History Social History  Substance Use Topics  . Smoking status: Current Some Day Smoker -- 0.25 packs/day    Types: Cigarettes  . Smokeless tobacco: Never Used  . Alcohol Use: No    No Known Allergies  Current Outpatient Prescriptions  Medication Sig Dispense Refill  . aspirin EC 81 MG EC tablet Take 1 tablet (81 mg total) by mouth daily.    Marland Kitchen atorvastatin (LIPITOR) 80 MG tablet Take 1 tablet (80 mg total) by mouth daily. 90 tablet 3  . buPROPion (WELLBUTRIN SR) 150 MG 12 hr tablet Take 1 tablet (150 mg total) by mouth 2 (two) times  daily. (Patient taking differently: Take 300 mg by mouth every evening. ) 60 tablet 11  . carvedilol (COREG) 12.5 MG tablet Take 1 tablet (12.5 mg total) by mouth 2 (two) times daily with a meal. 60 tablet 11  . diclofenac (VOLTAREN) 75 MG EC tablet Take 1 tablet (75 mg total) by mouth 2 (two) times daily with a meal. (Patient taking differently: Take 75 mg by mouth every evening. ) 60 tablet 2  . DULoxetine (CYMBALTA) 60 MG capsule TAKE ONE CAPSULE BY MOUTH ONCE DAILY (Patient taking differently: TAKE ONE CAPSULE BY MOUTH ONCE DAILY IN THE EVENING) 30 capsule 11  . furosemide (LASIX) 20 MG tablet TAKE ONE TABLET BY MOUTH ONCE DAILY (Patient taking differently: TAKE ONE TABLET BY MOUTH ONCE DAILY IN THE EVENING) 30 tablet 11  . HYDROcodone-acetaminophen (NORCO) 7.5-325 MG tablet Take 1 tablet by mouth every 4 (four) hours as needed for moderate pain (Must last 30 days.  Do not drive or operate machinery while taking this medicine.). 120 tablet 0  . insulin lispro (HUMALOG) 100 UNIT/ML injection Inject 75 Units into the skin 2 (two) times daily.     Marland Kitchen levothyroxine (SYNTHROID, LEVOTHROID) 112 MCG tablet Take 112 mcg by mouth every evening.     Marland Kitchen lisinopril (PRINIVIL,ZESTRIL) 5 MG tablet Take 1 tablet (5 mg total) by mouth daily. (Patient taking differently:  Take 5 mg by mouth every evening. ) 30 tablet 11  . nitroGLYCERIN (NITROSTAT) 0.4 MG SL tablet Place 1 tablet (0.4 mg total) under the tongue every 5 (five) minutes x 3 doses as needed for chest pain. 25 tablet 3  . sitaGLIPtin (JANUVIA) 100 MG tablet Take 100 mg by mouth every evening.     . ticagrelor (BRILINTA) 90 MG TABS tablet Take 1 tablet (90 mg total) by mouth 2 (two) times daily. 180 tablet 3  . tiZANidine (ZANAFLEX) 4 MG tablet One by mouth every 8 hours as needed for spasm 90 tablet 3  . Diclofenac Sodium 3 % GEL Use to knees, 4gm per dose, tid. 5 Tube 5   No current facility-administered medications for this visit.     Physical  Exam  Blood pressure 160/76, pulse 79, temperature 97.5 F (36.4 C), height 5\' 7"  (1.702 m), weight 272 lb 12.8 oz (123.741 kg).  Constitutional: overall normal hygiene, normal nutrition, well developed, normal grooming, normal body habitus. Assistive device:none  Musculoskeletal: gait and station Limp left, muscle tone and strength are normal, no tremors or atrophy is present.  .  Neurological: coordination overall normal.  Deep tendon reflex/nerve stretch intact.  Sensation normal.  Cranial nerves II-XII intact.   Skin:   normal overall no scars, lesions, ulcers or rashes. No psoriasis.  Psychiatric: Alert and oriented x 3.  Recent memory intact, remote memory unclear.  Normal mood and affect. Well groomed.  Good eye contact.  Cardiovascular: overall no swelling, no varicosities, no edema bilaterally, normal temperatures of the legs and arms, no clubbing, cyanosis and good capillary refill.  Lymphatic: palpation is normal.  The bilateral lower extremity is examined:  Inspection:  Thigh:  Non-tender and no defects  Knee has swelling 1+ effusion.                        Joint tenderness is present                        Patient is tender over the medial joint line  Lower Leg:  Has normal appearance and no tenderness or defects  Ankle:  Non-tender and no defects  Foot:  Non-tender and no defects Range of Motion:  Knee:  Range of motion is: 0-105 right, 0 to 100 left                        Crepitus is  present  Ankle:  Range of motion is normal. Strength and Tone:  The bilateral lower extremity has normal strength and tone. Stability:  Knee:  The knee is stable.  Ankle:  The ankle is stable.    The patient has been educated about the nature of the problem(s) and counseled on treatment options.  The patient appeared to understand what I have discussed and is in agreement with it.  Encounter Diagnoses  Name Primary?  . Left knee pain Yes  . Right knee pain   . Essential  hypertension   . Heavy tobacco smoker who smokes more than 10 cigarettes per day     PLAN Call if any problems.  Precautions discussed.  Continue current medications.   Return to clinic 3 months   Electronically Signed Sanjuana Kava, MD 7/6/20179:35 AM

## 2015-12-18 ENCOUNTER — Ambulatory Visit (INDEPENDENT_AMBULATORY_CARE_PROVIDER_SITE_OTHER): Payer: Commercial Managed Care - HMO | Admitting: Cardiovascular Disease

## 2015-12-18 ENCOUNTER — Encounter: Payer: Self-pay | Admitting: Cardiovascular Disease

## 2015-12-18 VITALS — BP 138/74 | HR 83 | Ht 67.0 in | Wt 273.0 lb

## 2015-12-18 DIAGNOSIS — I214 Non-ST elevation (NSTEMI) myocardial infarction: Secondary | ICD-10-CM | POA: Diagnosis not present

## 2015-12-18 DIAGNOSIS — Z955 Presence of coronary angioplasty implant and graft: Secondary | ICD-10-CM

## 2015-12-18 DIAGNOSIS — I10 Essential (primary) hypertension: Secondary | ICD-10-CM | POA: Diagnosis not present

## 2015-12-18 DIAGNOSIS — I25118 Atherosclerotic heart disease of native coronary artery with other forms of angina pectoris: Secondary | ICD-10-CM

## 2015-12-18 DIAGNOSIS — E785 Hyperlipidemia, unspecified: Secondary | ICD-10-CM

## 2015-12-18 NOTE — Progress Notes (Signed)
Patient ID: Jennifer [name], female   DOB: 09/18/1955, 60 y.o.   MRN: [id number]      SUBJECTIVE: The patient returns for routine follow-up. This is my first time meeting her. She was hospitalized in May 2017 for a non-STEMI and underwent right coronary artery drug-eluting stent placement with 2 overlapping stents. There was a mid RCA lesion of 60% treated medically. The second obtuse marginal branch had a 99% stenosis but was not amenable to PCI.  Echocardiogram 11/04/15 showed normal left ventricular systolic function and regional wall motion, EF 60-65%, with grade 1 diastolic dysfunction.  The patient denies any symptoms of chest pain, palpitations, shortness of breath, lightheadedness, dizziness, leg swelling, orthopnea, PND, and syncope.  Did not do cardiac rehab. Back to work and climbing stairs without difficulty.   Review of Systems: As per "subjective", otherwise negative.  No Known Allergies  Current Outpatient Prescriptions  Medication Sig Dispense Refill  . aspirin EC 81 MG EC tablet Take 1 tablet (81 mg total) by mouth daily.    Marland Kitchen atorvastatin (LIPITOR) 80 MG tablet Take 1 tablet (80 mg total) by mouth daily. 90 tablet 3  . buPROPion (WELLBUTRIN SR) 150 MG 12 hr tablet Take 1 tablet (150 mg total) by mouth 2 (two) times daily. (Patient taking differently: Take 300 mg by mouth every evening. ) 60 tablet 11  . carvedilol (COREG) 12.5 MG tablet Take 1 tablet (12.5 mg total) by mouth 2 (two) times daily with a meal. 60 tablet 11  . diclofenac (VOLTAREN) 75 MG EC tablet Take 1 tablet (75 mg total) by mouth 2 (two) times daily with a meal. (Patient taking differently: Take 75 mg by mouth every evening. ) 60 tablet 2  . Diclofenac Sodium 3 % GEL Use to knees, 4gm per dose, tid. 5 Tube 5  . DULoxetine (CYMBALTA) 60 MG capsule TAKE ONE CAPSULE BY MOUTH ONCE DAILY (Patient taking differently: TAKE ONE CAPSULE BY MOUTH ONCE DAILY IN THE EVENING) 30 capsule 11  . furosemide (LASIX) 20 MG  tablet TAKE ONE TABLET BY MOUTH ONCE DAILY (Patient taking differently: TAKE ONE TABLET BY MOUTH ONCE DAILY IN THE EVENING) 30 tablet 11  . HYDROcodone-acetaminophen (NORCO) 7.5-325 MG tablet Take 1 tablet by mouth every 4 (four) hours as needed for moderate pain (Must last 30 days.  Do not drive or operate machinery while taking this medicine.). 120 tablet 0  . insulin lispro (HUMALOG) 100 UNIT/ML injection Inject 75 Units into the skin 2 (two) times daily.     Marland Kitchen levothyroxine (SYNTHROID, LEVOTHROID) 112 MCG tablet Take 112 mcg by mouth every evening.     Marland Kitchen lisinopril (PRINIVIL,ZESTRIL) 5 MG tablet Take 1 tablet (5 mg total) by mouth daily. (Patient taking differently: Take 5 mg by mouth every evening. ) 30 tablet 11  . nitroGLYCERIN (NITROSTAT) 0.4 MG SL tablet Place 1 tablet (0.4 mg total) under the tongue every 5 (five) minutes x 3 doses as needed for chest pain. 25 tablet 3  . sitaGLIPtin (JANUVIA) 100 MG tablet Take 100 mg by mouth every evening.     . ticagrelor (BRILINTA) 90 MG TABS tablet Take 1 tablet (90 mg total) by mouth 2 (two) times daily. 180 tablet 3  . tiZANidine (ZANAFLEX) 4 MG tablet One by mouth every 8 hours as needed for spasm 90 tablet 3   No current facility-administered medications for this visit.    Past Medical History  Diagnosis Date  . Diabetes mellitus without complication ([place])   .  Thyroid disease   . Hypercholesteremia   . Hypertension   . CAD (coronary artery disease)     cath 5/23 100% dist RCA lesion treated with 2 overlapping Integrity Resolute DES ( 2.25x8mm, 2.25x54mm), 60% mid RCA treated medically, 99% OM2 not amenable to PCI, 70% D1 lesion, EF normal    Past Surgical History  Procedure Laterality Date  . Carpal tunnel release    . Abdominal hysterectomy    . Cardiac catheterization N/A 11/03/2015    Procedure: Left Heart Cath and Coronary Angiography;  Surgeon: Leonie Man, MD;  Location: [facility];  Service: Cardiovascular;   Laterality: N/A;  . Cardiac catheterization N/A 11/03/2015    Procedure: Coronary Stent Intervention;  Surgeon: Leonie Man, MD;  Location: [facility];  Service: Cardiovascular;  Laterality: N/A;    Social History   Social History  . Marital Status: Married    Spouse Name: N/A  . Number of Children: N/A  . Years of Education: N/A   Occupational History  . Not on file.   Social History Main Topics  . Smoking status: Current Some Day Smoker -- 0.25 packs/day    Types: Cigarettes  . Smokeless tobacco: Never Used  . Alcohol Use: No  . Drug Use: No  . Sexual Activity: Not on file   Other Topics Concern  . Not on file   Social History Narrative     Filed Vitals:   12/18/15 1007  BP: 138/74  Pulse: 83  Height: 5\' 7"  (1.702 m)  Weight: 273 lb (123.832 kg)  SpO2: 95%    PHYSICAL EXAM General: NAD HEENT: Normal. Neck: No JVD, no thyromegaly. Lungs: Clear to auscultation bilaterally with normal respiratory effort. CV: Nondisplaced PMI.  Regular rate and rhythm, normal S1/S2, no S3/S4, no murmur. No pretibial or periankle edema.  No carotid bruit.   Abdomen: Obese.  Neurologic: Alert and oriented.  Psych: Normal affect. Skin: Normal. Musculoskeletal: No gross deformities.    ECG: Most recent ECG reviewed.      ASSESSMENT AND PLAN: 1. CAD with NSTEMI and PCI: Symptomatically stable. Continue aspirin, Lipitor, Coreg, lisinopril, and Brilinta.  2. Essential HTN: Controlled. No changes.  3. Hyperlipidemia: On Lipitor 80 mg.  Dispo: fu 6 months.   Kate Sable, M.D., F.A.C.C.

## 2015-12-18 NOTE — Patient Instructions (Signed)
Your physician wants you to follow-up in: 6 Months with Dr. Koneswaran. You will receive a reminder letter in the mail two months in advance. If you don't receive a letter, please call our office to schedule the follow-up appointment.  Your physician recommends that you continue on your current medications as directed. Please refer to the Current Medication list given to you today.  If you need a refill on your cardiac medications before your next appointment, please call your pharmacy.  Thank you for choosing [facility]!   

## 2016-02-14 ENCOUNTER — Telehealth: Payer: Self-pay | Admitting: Orthopaedic Surgery

## 2016-02-19 ENCOUNTER — Telehealth: Payer: Self-pay | Admitting: Orthopaedic Surgery

## 2016-02-19 NOTE — Telephone Encounter (Signed)
Hydrocodone-Acetaminophen 7.5/325mg Qty 120 Tablets °

## 2016-02-22 MED ORDER — HYDROCODONE-ACETAMINOPHEN 7.5-325 MG PO TABS
1.0000 | ORAL_TABLET | Freq: Four times a day (QID) | ORAL | 0 refills | Status: DC | PRN
Start: 2016-02-22 — End: 2016-04-25

## 2016-03-17 ENCOUNTER — Ambulatory Visit: Payer: Self-pay | Admitting: Orthopaedic Surgery

## 2016-03-23 ENCOUNTER — Ambulatory Visit (INDEPENDENT_AMBULATORY_CARE_PROVIDER_SITE_OTHER): Payer: Commercial Managed Care - HMO | Admitting: Orthopaedic Surgery

## 2016-03-23 ENCOUNTER — Encounter: Payer: Self-pay | Admitting: Orthopaedic Surgery

## 2016-03-23 VITALS — BP 159/80 | HR 85 | Temp 97.0°F | Resp 18 | Ht 68.0 in | Wt 260.0 lb

## 2016-03-23 DIAGNOSIS — M25562 Pain in left knee: Secondary | ICD-10-CM | POA: Diagnosis not present

## 2016-03-23 DIAGNOSIS — F17210 Nicotine dependence, cigarettes, uncomplicated: Secondary | ICD-10-CM | POA: Diagnosis not present

## 2016-03-23 DIAGNOSIS — G8929 Other chronic pain: Secondary | ICD-10-CM

## 2016-03-23 NOTE — Patient Instructions (Signed)
Smoking Cessation, Tips for Success If you are ready to quit smoking, congratulations! You have chosen to help yourself be healthier. Cigarettes bring nicotine, tar, carbon monoxide, and other irritants into your body. Your lungs, heart, and blood vessels will be able to work better without these poisons. There are many different ways to quit smoking. Nicotine gum, nicotine patches, a nicotine inhaler, or nicotine nasal spray can help with physical craving. Hypnosis, support groups, and medicines help break the habit of smoking. WHAT THINGS CAN I DO TO MAKE QUITTING EASIER?  Here are some tips to help you quit for good:  Pick a date when you will quit smoking completely. Tell all of your friends and family about your plan to quit on that date.  Do not try to slowly cut down on the number of cigarettes you are smoking. Pick a quit date and quit smoking completely starting on that day.  Throw away all cigarettes.   Clean and remove all ashtrays from your home, work, and car.  On a card, write down your reasons for quitting. Carry the card with you and read it when you get the urge to smoke.  Cleanse your body of nicotine. Drink enough water and fluids to keep your urine clear or pale yellow. Do this after quitting to flush the nicotine from your body.  Learn to predict your moods. Do not let a bad situation be your excuse to have a cigarette. Some situations in your life might tempt you into wanting a cigarette.  Never have "just one" cigarette. It leads to wanting another and another. Remind yourself of your decision to quit.  Change habits associated with smoking. If you smoked while driving or when feeling stressed, try other activities to replace smoking. Stand up when drinking your coffee. Brush your teeth after eating. Sit in a different chair when you read the paper. Avoid alcohol while trying to quit, and try to drink fewer caffeinated beverages. Alcohol and caffeine may urge you to  smoke.  Avoid foods and drinks that can trigger a desire to smoke, such as sugary or spicy foods and alcohol.  Ask people who smoke not to smoke around you.  Have something planned to do right after eating or having a cup of coffee. For example, plan to take a walk or exercise.  Try a relaxation exercise to calm you down and decrease your stress. Remember, you may be tense and nervous for the first 2 weeks after you quit, but this will pass.  Find new activities to keep your hands busy. Play with a pen, coin, or rubber band. Doodle or draw things on paper.  Brush your teeth right after eating. This will help cut down on the craving for the taste of tobacco after meals. You can also try mouthwash.   Use oral substitutes in place of cigarettes. Try using lemon drops, carrots, cinnamon sticks, or chewing gum. Keep them handy so they are available when you have the urge to smoke.  When you have the urge to smoke, try deep breathing.  Designate your home as a nonsmoking area.  If you are a heavy smoker, ask your health care provider about a prescription for nicotine chewing gum. It can ease your withdrawal from nicotine.  Reward yourself. Set aside the cigarette money you save and buy yourself something nice.  Look for support from others. Join a support group or smoking cessation program. Ask someone at home or at work to help you with your plan   to quit smoking.  Always ask yourself, "Do I need this cigarette or is this just a reflex?" Tell yourself, "Today, I choose not to smoke," or "I do not want to smoke." You are reminding yourself of your decision to quit.  Do not replace cigarette smoking with electronic cigarettes (commonly called e-cigarettes). The safety of e-cigarettes is unknown, and some may contain harmful chemicals.  If you relapse, do not give up! Plan ahead and think about what you will do the next time you get the urge to smoke. HOW WILL I FEEL WHEN I QUIT SMOKING? You  may have symptoms of withdrawal because your body is used to nicotine (the addictive substance in cigarettes). You may crave cigarettes, be irritable, feel very hungry, cough often, get headaches, or have difficulty concentrating. The withdrawal symptoms are only temporary. They are strongest when you first quit but will go away within 10-14 days. When withdrawal symptoms occur, stay in control. Think about your reasons for quitting. Remind yourself that these are signs that your body is healing and getting used to being without cigarettes. Remember that withdrawal symptoms are easier to treat than the major diseases that smoking can cause.  Even after the withdrawal is over, expect periodic urges to smoke. However, these cravings are generally short lived and will go away whether you smoke or not. Do not smoke! WHAT RESOURCES ARE AVAILABLE TO HELP ME QUIT SMOKING? Your health care provider can direct you to community resources or hospitals for support, which may include:  Group support.  Education.  Hypnosis.  Therapy.   This information is not intended to replace advice given to you by your health care provider. Make sure you discuss any questions you have with your health care provider.   Document Released: 02/26/2004 Document Revised: 06/20/2014 Document Reviewed: 11/15/2012 Elsevier Interactive Patient Education 2016 [employer] Inc[employer]  

## 2016-03-23 NOTE — Progress Notes (Signed)
Patient Jennifer [name], female DOB:[birth year], 60 y.o. OZH:[id number]  Chief Complaint  Patient presents with  . Follow-up    3 month recheck on bilateral knee pain.    HPI  Jennifer [name] is a 60 y.o. female who has stable chronic bilateral knee pain. She has no new trauma, no falls, no giving way, no redness. She is active. She has swelling and popping. She is taking her medicine.  She still smokes. She has cut back but still smokes.  She had a heart attack the other year and I told her she has gotten her first major warning and really needs to stop.  She says it is too hard to do but will think about it. HPI  Body mass index is 39.53 kg/m.  ROS  Review of Systems  Constitutional:       She is current smoker  HENT: Negative for congestion.   Respiratory: Positive for shortness of breath. Negative for cough.   Cardiovascular: Negative for chest pain and leg swelling.  Endocrine: Positive for cold intolerance.  Musculoskeletal: Positive for arthralgias, gait problem and joint swelling.  Allergic/Immunologic: Positive for environmental allergies.  Neurological: Positive for numbness and headaches.       Peripheral neruopathy    Past Medical History:  Diagnosis Date  . CAD (coronary artery disease)    cath 5/23 100% dist RCA lesion treated with 2 overlapping Integrity Resolute DES ( 2.25x65mm, 2.25x62mm), 60% mid RCA treated medically, 99% OM2 not amenable to PCI, 70% D1 lesion, EF normal  . Diabetes mellitus without complication ([place])   . Hypercholesteremia   . Hypertension   . Thyroid disease     Past Surgical History:  Procedure Laterality Date  . ABDOMINAL HYSTERECTOMY    . CARDIAC CATHETERIZATION N/A 11/03/2015   Procedure: Left Heart Cath and Coronary Angiography;  Surgeon: Leonie Man, MD;  Location: [facility];  Service: Cardiovascular;  Laterality: N/A;  . CARDIAC CATHETERIZATION N/A 11/03/2015   Procedure: Coronary Stent Intervention;  Surgeon: Leonie Man, MD;  Location: [facility];  Service: Cardiovascular;  Laterality: N/A;  . CARPAL TUNNEL RELEASE      History reviewed. No pertinent family history.  Social History Social History  Substance Use Topics  . Smoking status: Current Some Day Smoker    Packs/day: 0.25    Types: Cigarettes  . Smokeless tobacco: Never Used  . Alcohol use No    No Known Allergies  Current Outpatient Prescriptions  Medication Sig Dispense Refill  . aspirin EC 81 MG EC tablet Take 1 tablet (81 mg total) by mouth daily.    Marland Kitchen atorvastatin (LIPITOR) 80 MG tablet Take 1 tablet (80 mg total) by mouth daily. 90 tablet 3  . buPROPion (WELLBUTRIN SR) 150 MG 12 hr tablet Take 1 tablet (150 mg total) by mouth 2 (two) times daily. (Patient taking differently: Take 300 mg by mouth every evening. ) 60 tablet 11  . carvedilol (COREG) 12.5 MG tablet Take 1 tablet (12.5 mg total) by mouth 2 (two) times daily with a meal. 60 tablet 11  . diclofenac (VOLTAREN) 75 MG EC tablet TAKE ONE TABLET BY MOUTH TWICE DAILY WITH A MEAL 60 tablet 2  . Diclofenac Sodium 3 % GEL Use to knees, 4gm per dose, tid. 5 Tube 5  . DULoxetine (CYMBALTA) 60 MG capsule TAKE ONE CAPSULE BY MOUTH ONCE DAILY (Patient taking differently: TAKE ONE CAPSULE BY MOUTH ONCE DAILY IN THE EVENING) 30 capsule 11  .  furosemide (LASIX) 20 MG tablet TAKE ONE TABLET BY MOUTH ONCE DAILY (Patient taking differently: TAKE ONE TABLET BY MOUTH ONCE DAILY IN THE EVENING) 30 tablet 11  . HYDROcodone-acetaminophen (NORCO) 7.5-325 MG tablet Take 1 tablet by mouth every 6 (six) hours as needed for moderate pain (Must last 30 days.Do not drive or operate machinery while taking this medicine.). 110 tablet 0  . insulin lispro (HUMALOG) 100 UNIT/ML injection Inject 75 Units into the skin 2 (two) times daily.     Marland Kitchen levothyroxine (SYNTHROID, LEVOTHROID) 112 MCG tablet Take 112 mcg by mouth every evening.     Marland Kitchen lisinopril (PRINIVIL,ZESTRIL) 5 MG tablet Take 1 tablet (5  mg total) by mouth daily. (Patient taking differently: Take 5 mg by mouth every evening. ) 30 tablet 11  . nitroGLYCERIN (NITROSTAT) 0.4 MG SL tablet Place 1 tablet (0.4 mg total) under the tongue every 5 (five) minutes x 3 doses as needed for chest pain. 25 tablet 3  . sitaGLIPtin (JANUVIA) 100 MG tablet Take 100 mg by mouth every evening.     . ticagrelor (BRILINTA) 90 MG TABS tablet Take 1 tablet (90 mg total) by mouth 2 (two) times daily. 180 tablet 3  . tiZANidine (ZANAFLEX) 4 MG tablet One by mouth every 8 hours as needed for spasm 90 tablet 3   No current facility-administered medications for this visit.      Physical Exam  Blood pressure (!) 159/80, pulse 85, temperature 97 F (36.1 C), resp. rate 18, height 5\' 8"  (1.727 m), weight 260 lb (117.9 kg).  Constitutional: overall normal hygiene, normal nutrition, well developed, normal grooming, normal body habitus. Assistive device:none  Musculoskeletal: gait and station Limp right, muscle tone and strength are normal, no tremors or atrophy is present.  .  Neurological: coordination overall normal.  Deep tendon reflex/nerve stretch intact.  Sensation normal.  Cranial nerves II-XII intact.   Skin:   Normal overall no scars, lesions, ulcers or rashes. No psoriasis.  Psychiatric: Alert and oriented x 3.  Recent memory intact, remote memory unclear.  Normal mood and affect. Well groomed.  Good eye contact.  Cardiovascular: overall no swelling, no varicosities, no edema bilaterally, normal temperatures of the legs and arms, no clubbing, cyanosis and good capillary refill.  Lymphatic: palpation is normal.  The bilateral lower extremity is examined:  Inspection:  Thigh:  Non-tender and no defects  Knee has swelling 1+ effusion.                        Joint tenderness is present                        Patient is tender over the medial joint line  Lower Leg:  Has normal appearance and no tenderness or defects  Ankle:  Non-tender and  no defects  Foot:  Non-tender and no defects Range of Motion:  Knee:  Range of motion is: 0-105 right, 0 to 110 left                        Crepitus is  present  Ankle:  Range of motion is normal. Strength and Tone:  The bilateral lower extremity has normal strength and tone. Stability:  Knee:  The knee is stable.  Ankle:  The ankle is stable.     The patient has been educated about the nature of the problem(s) and counseled on treatment options.  The patient appeared to understand what I have discussed and is in agreement with it.  Encounter Diagnoses  Name Primary?  . Chronic pain of left knee Yes  . Cigarette nicotine dependence without complication     PLAN Call if any problems.  Precautions discussed.  Continue current medications.   Return to clinic 3 months   Electronically Signed Sanjuana Kava, MD 10/11/20179:44 AM

## 2016-04-25 ENCOUNTER — Telehealth: Payer: Self-pay | Admitting: Orthopaedic Surgery

## 2016-04-25 ENCOUNTER — Other Ambulatory Visit: Payer: Self-pay | Admitting: *Deleted

## 2016-04-25 MED ORDER — HYDROCODONE-ACETAMINOPHEN 7.5-325 MG PO TABS: 1.0000 | ORAL_TABLET | Freq: Four times a day (QID) | ORAL | 0 refills | Status: DC | PRN

## 2016-04-25 NOTE — Telephone Encounter (Signed)
Patient of Dr Brooke Bonito called for refill:  (aware Dr Aline Brochure is reviewing while Dr Luna Glasgow is out of office this week HYDROcodone-acetaminophen ([place]) 7.5-325 MG tablet 110 tablet

## 2016-05-07 ENCOUNTER — Other Ambulatory Visit: Payer: Self-pay | Admitting: Family Medicine

## 2016-05-10 NOTE — Telephone Encounter (Signed)
Left voicemail requesting pt to call the office back 

## 2016-05-10 NOTE — Telephone Encounter (Signed)
No recent/future appts., please advise  

## 2016-05-10 NOTE — Telephone Encounter (Signed)
Please schedule 30 min f/u when able and refill until then  thanks

## 2016-05-12 NOTE — Telephone Encounter (Signed)
Pt LVM on triage phone returning [place] call.  Best number is [phone]

## 2016-05-13 NOTE — Telephone Encounter (Signed)
appt scheduled and med refill

## 2016-06-14 ENCOUNTER — Telehealth: Payer: Self-pay | Admitting: Orthopaedic Surgery

## 2016-06-14 ENCOUNTER — Other Ambulatory Visit: Payer: Self-pay | Admitting: Family Medicine

## 2016-06-22 ENCOUNTER — Ambulatory Visit: Payer: Self-pay | Admitting: Orthopaedic Surgery

## 2016-06-22 DIAGNOSIS — E039 Hypothyroidism, unspecified: Secondary | ICD-10-CM | POA: Diagnosis not present

## 2016-06-22 DIAGNOSIS — E1121 Type 2 diabetes mellitus with diabetic nephropathy: Secondary | ICD-10-CM | POA: Diagnosis not present

## 2016-06-22 DIAGNOSIS — E1165 Type 2 diabetes mellitus with hyperglycemia: Secondary | ICD-10-CM | POA: Diagnosis not present

## 2016-06-23 ENCOUNTER — Ambulatory Visit: Payer: Self-pay | Admitting: Orthopaedic Surgery

## 2016-06-29 ENCOUNTER — Ambulatory Visit: Payer: Self-pay | Admitting: Family Medicine

## 2016-06-30 ENCOUNTER — Ambulatory Visit: Payer: Self-pay | Admitting: Orthopaedic Surgery

## 2016-07-05 ENCOUNTER — Encounter: Payer: Self-pay | Admitting: Family Medicine

## 2016-07-05 ENCOUNTER — Ambulatory Visit (INDEPENDENT_AMBULATORY_CARE_PROVIDER_SITE_OTHER): Payer: Commercial Managed Care - HMO | Admitting: Family Medicine

## 2016-07-05 VITALS — BP 140/68 | HR 86 | Temp 98.6°F | Ht 67.0 in | Wt 272.5 lb

## 2016-07-05 DIAGNOSIS — Z23 Encounter for immunization: Secondary | ICD-10-CM | POA: Diagnosis not present

## 2016-07-05 DIAGNOSIS — F17200 Nicotine dependence, unspecified, uncomplicated: Secondary | ICD-10-CM

## 2016-07-05 DIAGNOSIS — E785 Hyperlipidemia, unspecified: Secondary | ICD-10-CM

## 2016-07-05 DIAGNOSIS — I214 Non-ST elevation (NSTEMI) myocardial infarction: Secondary | ICD-10-CM | POA: Diagnosis not present

## 2016-07-05 DIAGNOSIS — J449 Chronic obstructive pulmonary disease, unspecified: Secondary | ICD-10-CM

## 2016-07-05 DIAGNOSIS — E11319 Type 2 diabetes mellitus with unspecified diabetic retinopathy without macular edema: Secondary | ICD-10-CM

## 2016-07-05 DIAGNOSIS — Z794 Long term (current) use of insulin: Secondary | ICD-10-CM

## 2016-07-05 DIAGNOSIS — I10 Essential (primary) hypertension: Secondary | ICD-10-CM

## 2016-07-05 DIAGNOSIS — F4323 Adjustment disorder with mixed anxiety and depressed mood: Secondary | ICD-10-CM

## 2016-07-05 MED ORDER — DULOXETINE HCL 60 MG PO CPEP: 60.0000 mg | ORAL_CAPSULE | Freq: Every day | ORAL | 3 refills | Status: DC

## 2016-07-05 MED ORDER — LISINOPRIL 5 MG PO TABS: 5.0000 mg | ORAL_TABLET | Freq: Every day | ORAL | 3 refills | Status: DC

## 2016-07-05 MED ORDER — BUPROPION HCL ER (SR) 150 MG PO TB12: 150.0000 mg | ORAL_TABLET | Freq: Two times a day (BID) | ORAL | 3 refills | Status: DC

## 2016-07-05 MED ORDER — FUROSEMIDE 20 MG PO TABS: 20.0000 mg | ORAL_TABLET | Freq: Every day | ORAL | 3 refills | Status: DC

## 2016-07-05 NOTE — Progress Notes (Signed)
Subjective:    Patient ID: Jennifer [name], female    DOB: 1956/04/07, 61 y.o.   MRN: [id number]  HPI  Here for f/u of chronic health problems   Doing ok overall  Stays tired  A lot to keep up with overall    Wt Readings from Last 3 Encounters:  07/05/16 272 lb 8 oz (123.6 kg)  03/23/16 260 lb (117.9 kg)  12/18/15 273 lb (123.8 kg)  lost some and gained some back  Thinks it may be fluid retention -has swollen legs- sees cardiology tomorrow (no sob however)  bmi is 42.6   bp is up today (has not had her medicine today !) - missed her lisinopril  Will see how it is at cardiology tomorrow  No cp or palpitations or headaches or edema  No side effects to medicines  BP Readings from Last 3 Encounters:  07/05/16 140/68  03/23/16 (!) 159/80  12/18/15 138/74      Smoking status -about 15 cig per day  She only smokes at work   (room is full of smokers)  Does not think she can quit - but plans to keep working on it  Husband does not want to quit- he smokes 3 ppd She will be moving into a new house and wants to not have smoke in the house    Hx of copd -no change  Gets a little sob if she rushes at work Does not think she needs inhalers No wheezing  No smokers cough   Had her flu shot here today  Hx of CAD - stable overall however having swelling in legs today  occ feels a little funny-but no angina  Stays a little anxious after her heart attack   Hx of hyperlipidemia Lab Results  Component Value Date   CHOL 154 11/04/2015   HDL 37 (L) 11/04/2015   LDLCALC 79 11/04/2015   LDLDIRECT 154.2 11/04/2011   TRIG 190 (H) 11/04/2015   CHOLHDL 4.2 11/04/2015   Thinks she had cholesterol with Dr Chalmers Cater last week-pending results      Sees Dr Chalmers Cater for her DM and also hypothyroidism  DM is stable  Last A1C was 8.1 - adjusting her insulin after cutting back on it  Diet has not been great- eating less however (salads at work) No sweets  Needs to exercise- considering a  treadmill   Recently changed levothyroxine to 112 mcg    Hx of mood disorder with depression and anxiety  On wellbutrin and cymbalta  Doing well with this  Does not want to stop these Has improved  Very much with medication  Stress level is worse at home than at work   Patient Active Problem List   Diagnosis Date Noted  . CAD (coronary artery disease) 11/04/2015  . NSTEMI (non-ST elevated myocardial infarction) ([place]) 11/03/2015  . LEUKOCYTOSIS 08/16/2010  . Hypothyroidism 07/06/2010  . Hyperlipidemia LDL goal <100 08/28/2008  . Essential hypertension 11/13/2007  . Diabetes type 2, controlled ([place]) 09/21/2006  . DIABETIC PERIPHERAL NEUROPATHY 09/21/2006  . Morbid obesity ([place]) 09/21/2006  . ANXIETY 09/21/2006  . TOBACCO ABUSE 09/21/2006  . Adjustment disorder with mixed anxiety and depressed mood 09/21/2006  . CARPAL TUNNEL SYNDROME 09/21/2006  . COPD (chronic obstructive pulmonary disease) ([place]) 09/21/2006  . EDEMA 09/21/2006  . MIGRAINES, HX OF 09/21/2006   Past Medical History:  Diagnosis Date  . CAD (coronary artery disease)    cath 5/23 100% dist RCA lesion treated with 2 overlapping Integrity  Resolute DES ( 2.25x62mm, 2.25x86mm), 60% mid RCA treated medically, 99% OM2 not amenable to PCI, 70% D1 lesion, EF normal  . Diabetes mellitus without complication ([place])   . Hypercholesteremia   . Hypertension   . Thyroid disease    Past Surgical History:  Procedure Laterality Date  . ABDOMINAL HYSTERECTOMY    . CARDIAC CATHETERIZATION N/A 11/03/2015   Procedure: Left Heart Cath and Coronary Angiography;  Surgeon: Leonie Man, MD;  Location: [facility];  Service: Cardiovascular;  Laterality: N/A;  . CARDIAC CATHETERIZATION N/A 11/03/2015   Procedure: Coronary Stent Intervention;  Surgeon: Leonie Man, MD;  Location: [facility];  Service: Cardiovascular;  Laterality: N/A;  . CARPAL TUNNEL RELEASE     Social History  Substance Use Topics  . Smoking  status: Current Some Day Smoker    Packs/day: 0.25    Types: Cigarettes  . Smokeless tobacco: Never Used  . Alcohol use No   No family history on file. No Known Allergies Current Outpatient Prescriptions on File Prior to Visit  Medication Sig Dispense Refill  . aspirin EC 81 MG EC tablet Take 1 tablet (81 mg total) by mouth daily.    Marland Kitchen atorvastatin (LIPITOR) 80 MG tablet Take 1 tablet (80 mg total) by mouth daily. 90 tablet 3  . carvedilol (COREG) 12.5 MG tablet Take 1 tablet (12.5 mg total) by mouth 2 (two) times daily with a meal. 60 tablet 11  . diclofenac (VOLTAREN) 75 MG EC tablet TAKE ONE TABLET BY MOUTH TWICE DAILY WITH A MEAL 60 tablet 2  . Diclofenac Sodium 3 % GEL Use to knees, 4gm per dose, tid. 5 Tube 5  . HYDROcodone-acetaminophen (NORCO) 7.5-325 MG tablet Take 1 tablet by mouth every 6 (six) hours as needed for moderate pain (Must last 30 days.Do not drive or operate machinery while taking this medicine.). 110 tablet 0  . insulin lispro (HUMALOG) 100 UNIT/ML injection Inject 75 Units into the skin 2 (two) times daily.     Marland Kitchen levothyroxine (SYNTHROID, LEVOTHROID) 112 MCG tablet Take 112 mcg by mouth every evening.     . nitroGLYCERIN (NITROSTAT) 0.4 MG SL tablet Place 1 tablet (0.4 mg total) under the tongue every 5 (five) minutes x 3 doses as needed for chest pain. 25 tablet 3  . sitaGLIPtin (JANUVIA) 100 MG tablet Take 100 mg by mouth every evening.     . ticagrelor (BRILINTA) 90 MG TABS tablet Take 1 tablet (90 mg total) by mouth 2 (two) times daily. 180 tablet 3  . tiZANidine (ZANAFLEX) 4 MG tablet One by mouth every 8 hours as needed for spasm 90 tablet 3   No current facility-administered medications on file prior to visit.     Review of Systems    Review of Systems  Constitutional: Negative for fever, appetite change,  and unexpected weight change.  Eyes: Negative for pain and visual disturbance.  Respiratory: Negative for cough and shortness of breath.     Cardiovascular: Negative for cp or palpitations   pos for some pedal edema w/o leg pain  Gastrointestinal: Negative for nausea, diarrhea and constipation.  Genitourinary: Negative for urgency and frequency.  Skin: Negative for pallor or rash   Neurological: Negative for weakness, light-headedness, and headaches. Pos for DM neuropathy symptoms  Hematological: Negative for adenopathy. Does not bruise/bleed easily.  Psychiatric/Behavioral: Negative for dysphoric mood. The patient is not nervous/anxious.      Objective:   Physical Exam  Constitutional: She appears  well-developed and well-nourished. No distress.  obese and well appearing   HENT:  Head: Normocephalic and atraumatic.  Mouth/Throat: Oropharynx is clear and moist.  Eyes: Conjunctivae and EOM are normal. Pupils are equal, round, and reactive to light.  Neck: Normal range of motion. Neck supple. No JVD present. Carotid bruit is not present. No thyromegaly present.  Cardiovascular: Normal rate, regular rhythm, normal heart sounds and intact distal pulses.  Exam reveals no gallop.   Pulmonary/Chest: Effort normal and breath sounds normal. No respiratory distress. She has no wheezes. She has no rales.  No crackles  Diffusely distant bs  Scant wheeze on forced exp only  Good air exch   Abdominal: Soft. Bowel sounds are normal. She exhibits no distension, no abdominal bruit and no mass. There is no tenderness.  Musculoskeletal: She exhibits edema.  Trace pedal edema bilat  No tenderness  Lymphadenopathy:    She has no cervical adenopathy.  Neurological: She is alert. She has normal reflexes.  Skin: Skin is warm and dry. No rash noted. No pallor.  Psychiatric: She has a normal mood and affect.          Assessment & Plan:   Problem List Items Addressed This Visit      Cardiovascular and Mediastinum   Essential hypertension (Chronic)    bp is up today because she forgot her lisinopril  Should be improved at cardiology  visit tomorrow  Disc DASH diet/smoking cessation  BP: 140/68        Relevant Medications   lisinopril (PRINIVIL,ZESTRIL) 5 MG tablet   furosemide (LASIX) 20 MG tablet   NSTEMI (non-ST elevated myocardial infarction) ([facility])    No cp currently  Rev meds Cardiology visit tomorrow C/o some pedal edema      Relevant Medications   lisinopril (PRINIVIL,ZESTRIL) 5 MG tablet   furosemide (LASIX) 20 MG tablet     Respiratory   COPD (chronic obstructive pulmonary disease) ([facility]) (Chronic)    Continues to smoke No c/o  Denies smokers cough  Smoking cessation enc        Endocrine   Diabetes type 2, controlled ([facility]) (Chronic)    Seen by Dr Chalmers Cater for this  Enc f/u for eye exam  Labs recently- I do not have copy Stressed imp of low glycemic diet and wt loss      Relevant Medications   lisinopril (PRINIVIL,ZESTRIL) 5 MG tablet     Other   Adjustment disorder with mixed anxiety and depressed mood    cymbalta and wellbutrin refilled Pt states this has helped greatly  Reviewed stressors/ coping techniques/symptoms/ support sources/ tx options and side effects in detail today  Overall good mood today  Enc exercise and outdoor time when able       Hyperlipidemia LDL goal <100   Relevant Medications   lisinopril (PRINIVIL,ZESTRIL) 5 MG tablet   furosemide (LASIX) 20 MG tablet   Morbid obesity ([facility])    Discussed how this problem influences overall health and the risks it imposes  Reviewed plan for weight loss with lower calorie diet (via better food choices and also portion control or program like weight watchers) and exercise building up to or more than 30 minutes 5 days per week including some aerobic activity         TOBACCO ABUSE (Chronic)    Disc in detail risks of smoking and possible outcomes including copd, vascular/ heart disease, cancer , respiratory and sinus infections  Pt voices understanding Pt states she is not  ready to quit  Disc opt for quitting- difficult in  the workplace Will find someone to quit with        Other Visit Diagnoses    Need for influenza vaccination    -  Primary   Relevant Orders   Flu Vaccine QUAD 36+ mos IM (Completed)

## 2016-07-05 NOTE — Progress Notes (Signed)
Pre visit review using our clinic review tool, if applicable. No additional management support is needed unless otherwise documented below in the visit note. 

## 2016-07-05 NOTE — Patient Instructions (Addendum)
Blood pressure is up today since you did not take your lisinopril Make sure to take your medicine tomorrow for your cardiology appointment so they get an accurate blood pressure reading   Keep seeing Dr Chalmers Cater and working on your diabetes  Keep working on [employer] and exercise and weight loss Keep thinking about quitting smoking   Medicines refilled today   See cardiology as planned   Don't forget to make an eye doctor appointment

## 2016-07-06 ENCOUNTER — Ambulatory Visit (INDEPENDENT_AMBULATORY_CARE_PROVIDER_SITE_OTHER): Payer: Commercial Managed Care - HMO | Admitting: Cardiovascular Disease

## 2016-07-06 ENCOUNTER — Encounter: Payer: Self-pay | Admitting: Cardiovascular Disease

## 2016-07-06 VITALS — BP 140/88 | HR 89 | Ht 67.0 in | Wt 289.0 lb

## 2016-07-06 DIAGNOSIS — E782 Mixed hyperlipidemia: Secondary | ICD-10-CM

## 2016-07-06 DIAGNOSIS — I252 Old myocardial infarction: Secondary | ICD-10-CM

## 2016-07-06 DIAGNOSIS — I25118 Atherosclerotic heart disease of native coronary artery with other forms of angina pectoris: Secondary | ICD-10-CM | POA: Diagnosis not present

## 2016-07-06 DIAGNOSIS — I10 Essential (primary) hypertension: Secondary | ICD-10-CM

## 2016-07-06 DIAGNOSIS — R600 Localized edema: Secondary | ICD-10-CM | POA: Diagnosis not present

## 2016-07-06 DIAGNOSIS — Z955 Presence of coronary angioplasty implant and graft: Secondary | ICD-10-CM

## 2016-07-06 MED ORDER — LISINOPRIL 10 MG PO TABS: 10.0000 mg | ORAL_TABLET | Freq: Every day | ORAL | 3 refills | Status: DC

## 2016-07-06 MED ORDER — FUROSEMIDE 20 MG PO TABS: 20.0000 mg | ORAL_TABLET | Freq: Every day | ORAL | 3 refills | Status: DC

## 2016-07-06 NOTE — Progress Notes (Signed)
SUBJECTIVE: The patient returns for routine follow-up. She was hospitalized in May 2017 for a non-STEMI and underwent right coronary artery drug-eluting stent placement with 2 overlapping stents. There was a mid RCA lesion of 60% treated medically. The second obtuse marginal branch had a 99% stenosis but was not amenable to PCI.  Echocardiogram 11/04/15 showed normal left ventricular systolic function and regional wall motion, EF 60-65%, with grade 1 diastolic dysfunction.  The patient denies any symptoms of chest pain, palpitations, shortness of breath, lightheadedness, dizziness, orthopnea, PND, and syncope.  She has had headaches in the last 1 month. Blood pressure is 140/88. She has also developed bilateral leg swelling.   Review of Systems: As per "subjective", otherwise negative.  No Known Allergies  Current Outpatient Prescriptions  Medication Sig Dispense Refill  . aspirin EC 81 MG EC tablet Take 1 tablet (81 mg total) by mouth daily.    Marland Kitchen atorvastatin (LIPITOR) 80 MG tablet Take 1 tablet (80 mg total) by mouth daily. 90 tablet 3  . buPROPion (WELLBUTRIN SR) 150 MG 12 hr tablet Take 1 tablet (150 mg total) by mouth 2 (two) times daily. 180 tablet 3  . carvedilol (COREG) 12.5 MG tablet Take 1 tablet (12.5 mg total) by mouth 2 (two) times daily with a meal. 60 tablet 11  . diclofenac (VOLTAREN) 75 MG EC tablet TAKE ONE TABLET BY MOUTH TWICE DAILY WITH A MEAL 60 tablet 2  . Diclofenac Sodium 3 % GEL Use to knees, 4gm per dose, tid. 5 Tube 5  . DULoxetine (CYMBALTA) 60 MG capsule Take 1 capsule (60 mg total) by mouth daily. 90 capsule 3  . furosemide (LASIX) 20 MG tablet Take 1 tablet (20 mg total) by mouth daily. 90 tablet 3  . HYDROcodone-acetaminophen (NORCO) 7.5-325 MG tablet Take 1 tablet by mouth every 6 (six) hours as needed for moderate pain (Must last 30 days.Do not drive or operate machinery while taking this medicine.). 110 tablet 0  . insulin lispro (HUMALOG) 100  UNIT/ML injection Inject 75 Units into the skin 2 (two) times daily.     Marland Kitchen levothyroxine (SYNTHROID, LEVOTHROID) 112 MCG tablet Take 112 mcg by mouth every evening.     Marland Kitchen lisinopril (PRINIVIL,ZESTRIL) 5 MG tablet Take 1 tablet (5 mg total) by mouth daily. 90 tablet 3  . nitroGLYCERIN (NITROSTAT) 0.4 MG SL tablet Place 1 tablet (0.4 mg total) under the tongue every 5 (five) minutes x 3 doses as needed for chest pain. 25 tablet 3  . sitaGLIPtin (JANUVIA) 100 MG tablet Take 100 mg by mouth every evening.     . ticagrelor (BRILINTA) 90 MG TABS tablet Take 1 tablet (90 mg total) by mouth 2 (two) times daily. 180 tablet 3  . tiZANidine (ZANAFLEX) 4 MG tablet One by mouth every 8 hours as needed for spasm 90 tablet 3   No current facility-administered medications for this visit.     Past Medical History:  Diagnosis Date  . CAD (coronary artery disease)    cath 5/23 100% dist RCA lesion treated with 2 overlapping Integrity Resolute DES ( 2.25x72mm, 2.25x92mm), 60% mid RCA treated medically, 99% OM2 not amenable to PCI, 70% D1 lesion, EF normal  . Diabetes mellitus without complication ([place])   . Hypercholesteremia   . Hypertension   . Thyroid disease     Past Surgical History:  Procedure Laterality Date  . ABDOMINAL HYSTERECTOMY    . CARDIAC CATHETERIZATION N/A 11/03/2015   Procedure: Left Heart  Cath and Coronary Angiography;  Surgeon: Leonie Man, MD;  Location: [facility];  Service: Cardiovascular;  Laterality: N/A;  . CARDIAC CATHETERIZATION N/A 11/03/2015   Procedure: Coronary Stent Intervention;  Surgeon: Leonie Man, MD;  Location: [facility];  Service: Cardiovascular;  Laterality: N/A;  . CARPAL TUNNEL RELEASE      Social History   Social History  . Marital status: Married    Spouse name: N/A  . Number of children: N/A  . Years of education: N/A   Occupational History  . Not on file.   Social History Main Topics  . Smoking status: Current Some Day Smoker     Packs/day: 0.25    Types: Cigarettes  . Smokeless tobacco: Never Used  . Alcohol use No  . Drug use: No  . Sexual activity: Not on file   Other Topics Concern  . Not on file   Social History Narrative  . No narrative on file     Vitals:   07/06/16 0849  BP: 140/88  Pulse: 89  SpO2: 95%  Weight: 289 lb (131.1 kg)  Height: 5\' 7"  (1.702 m)    PHYSICAL EXAM General: NAD HEENT: Normal. Neck: No JVD, no thyromegaly. Lungs: Clear to auscultation bilaterally with normal respiratory effort. CV: Nondisplaced PMI.  Regular rate and rhythm, normal S1/S2, no S3/S4, no murmur. 1+ bilateral pitting pretibial and periankle edema.  No carotid bruit.   Abdomen: Obese.  Neurologic: Alert and oriented.  Psych: Normal affect. Skin: Normal. Musculoskeletal: No gross deformities.    ECG: Most recent ECG reviewed.      ASSESSMENT AND PLAN:  1. CAD with NSTEMI and PCI: Symptomatically stable. Continue aspirin, Lipitor, Coreg, lisinopril (increase to 10 mg), and Brilinta. Will reduce Brilinta to 60 mg bid in June 2018.  2. Essential HTN: Mildly elevated. Needs weight loss. I will increase lisinopril to 10 mg daily.  3. Hyperlipidemia: On Lipitor 80 mg.  4. Bilateral leg edema: I will prescribe Lasix 20 mg daily for 3 days and then to be taken as needed.  Dispo: fu 3 months.  Kate Sable, M.D., F.A.C.C.

## 2016-07-06 NOTE — Assessment & Plan Note (Signed)
Continues to smoke No c/o  Denies smokers cough  Smoking cessation enc

## 2016-07-06 NOTE — Assessment & Plan Note (Signed)
Discussed how this problem influences overall health and the risks it imposes  Reviewed plan for weight loss with lower calorie diet (via better food choices and also portion control or program like weight watchers) and exercise building up to or more than 30 minutes 5 days per week including some aerobic activity    

## 2016-07-06 NOTE — Assessment & Plan Note (Signed)
bp is up today because she forgot her lisinopril  Should be improved at cardiology visit tomorrow  Disc DASH diet/smoking cessation  BP: 140/68

## 2016-07-06 NOTE — Assessment & Plan Note (Signed)
No cp currently  Rev meds Cardiology visit tomorrow C/o some pedal edema

## 2016-07-06 NOTE — Assessment & Plan Note (Signed)
cymbalta and wellbutrin refilled Pt states this has helped greatly  Reviewed stressors/ coping techniques/symptoms/ support sources/ tx options and side effects in detail today  Overall good mood today  Enc exercise and outdoor time when able

## 2016-07-06 NOTE — Patient Instructions (Signed)
Medication Instructions:  Take lasix 20 mg daily for next 3 days, then take 20 mg daily AS NEEDED FOR LEG SWELLLING INCREASE LISINOPRIL TO 10 MG DAILY  Labwork: NONE  Testing/Procedures: NONE  Follow-Up: Your physician recommends that you schedule a follow-up appointment in: 3 MONTHS    Any Other Special Instructions Will Be Listed Below (If Applicable).     If you need a refill on your cardiac medications before your next appointment, please call your pharmacy.

## 2016-07-06 NOTE — Assessment & Plan Note (Signed)
Seen by Dr Chalmers Cater for this  Enc f/u for eye exam  Labs recently- I do not have copy Stressed imp of low glycemic diet and wt loss

## 2016-07-06 NOTE — Assessment & Plan Note (Signed)
Disc in detail risks of smoking and possible outcomes including copd, vascular/ heart disease, cancer , respiratory and sinus infections  Pt voices understanding Pt states she is not ready to quit  Disc opt for quitting- difficult in the workplace Will find someone to quit with

## 2016-07-07 ENCOUNTER — Ambulatory Visit (INDEPENDENT_AMBULATORY_CARE_PROVIDER_SITE_OTHER): Payer: Commercial Managed Care - HMO | Admitting: Orthopaedic Surgery

## 2016-07-07 ENCOUNTER — Encounter: Payer: Self-pay | Admitting: Orthopaedic Surgery

## 2016-07-07 VITALS — BP 183/85 | HR 80 | Temp 97.2°F | Ht 67.0 in | Wt 269.0 lb

## 2016-07-07 DIAGNOSIS — M25561 Pain in right knee: Secondary | ICD-10-CM

## 2016-07-07 DIAGNOSIS — F17210 Nicotine dependence, cigarettes, uncomplicated: Secondary | ICD-10-CM | POA: Diagnosis not present

## 2016-07-07 DIAGNOSIS — G8929 Other chronic pain: Secondary | ICD-10-CM

## 2016-07-07 MED ORDER — DICLOFENAC SODIUM 3 % TD GEL: TRANSDERMAL | 5 refills | Status: DC

## 2016-07-07 MED ORDER — HYDROCODONE-ACETAMINOPHEN 7.5-325 MG PO TABS: 1.0000 | ORAL_TABLET | Freq: Four times a day (QID) | ORAL | 0 refills | Status: DC | PRN

## 2016-07-07 NOTE — Patient Instructions (Signed)
Steps to Quit Smoking Smoking tobacco can be bad for your health. It can also affect almost every organ in your body. Smoking puts you and people around you at risk for many serious long-lasting (chronic) diseases. Quitting smoking is hard, but it is one of the best things that you can do for your health. It is never too late to quit. What are the benefits of quitting smoking? When you quit smoking, you lower your risk for getting serious diseases and conditions. They can include:  Lung cancer or lung disease.  Heart disease.  Stroke.  Heart attack.  Not being able to have children (infertility).  Weak bones (osteoporosis) and broken bones (fractures). If you have coughing, wheezing, and shortness of breath, those symptoms may get better when you quit. You may also get sick less often. If you are pregnant, quitting smoking can help to lower your chances of having a baby of low birth weight. What can I do to help me quit smoking? Talk with your doctor about what can help you quit smoking. Some things you can do (strategies) include:  Quitting smoking totally, instead of slowly cutting back how much you smoke over a period of time.  Going to in-person counseling. You are more likely to quit if you go to many counseling sessions.  Using resources and support systems, such as:  Online chats with a counselor.  Phone quitlines.  Printed self-help materials.  Support groups or group counseling.  Text messaging programs.  Mobile phone apps or applications.  Taking medicines. Some of these medicines may have nicotine in them. If you are pregnant or breastfeeding, do not take any medicines to quit smoking unless your doctor says it is okay. Talk with your doctor about counseling or other things that can help you. Talk with your doctor about using more than one strategy at the same time, such as taking medicines while you are also going to in-person counseling. This can help make quitting  easier. What things can I do to make it easier to quit? Quitting smoking might feel very hard at first, but there is a lot that you can do to make it easier. Take these steps:  Talk to your family and friends. Ask them to support and encourage you.  Call phone quitlines, reach out to support groups, or work with a counselor.  Ask people who smoke to not smoke around you.  Avoid places that make you want (trigger) to smoke, such as:  Bars.  Parties.  Smoke-break areas at work.  Spend time with people who do not smoke.  Lower the stress in your life. Stress can make you want to smoke. Try these things to help your stress:  Getting regular exercise.  Deep-breathing exercises.  Yoga.  Meditating.  Doing a body scan. To do this, close your eyes, focus on one area of your body at a time from head to toe, and notice which parts of your body are tense. Try to relax the muscles in those areas.  Download or buy apps on your mobile phone or tablet that can help you stick to your quit plan. There are many free apps, such as QuitGuide from the CDC (Centers for Disease Control and Prevention). You can find more support from smokefree.gov and other websites. This information is not intended to replace advice given to you by your health care provider. Make sure you discuss any questions you have with your health care provider. Document Released: 03/26/2009 Document Revised: 01/26/2016 Document   Reviewed: 10/14/2014 Elsevier Interactive Patient Education  2017 Elsevier Inc.  

## 2016-07-07 NOTE — Progress Notes (Signed)
CC:  I have pain of my right knee. I would like an injection.  The patient has chronic pain of the right knee.  There is no recent trauma.  There is no redness.  Injections in the past have helped.  The knee has no redness, has an effusion and crepitus present.  ROM of the right knee is 0-105.  Impression:  Chronic knee pain right  Return: 3 months  PROCEDURE NOTE:  The patient requests injections of the right knee , verbal consent was obtained.  The right knee was prepped appropriately after time out was performed.   Sterile technique was observed and injection of 1 cc of Depo-Medrol 40 mg with several cc's of plain xylocaine. Anesthesia was provided by ethyl chloride and a 20-gauge needle was used to inject the knee area. The injection was tolerated well.  A band aid dressing was applied.  The patient was advised to apply ice later today and tomorrow to the injection sight as needed.  I have reviewed the [place] web site prior to prescribing narcotic medicine for this patient.  Electronically Signed Sanjuana Kava, MD 1/25/201810:01 AM

## 2016-08-11 DIAGNOSIS — E039 Hypothyroidism, unspecified: Secondary | ICD-10-CM | POA: Diagnosis not present

## 2016-08-11 DIAGNOSIS — I10 Essential (primary) hypertension: Secondary | ICD-10-CM | POA: Diagnosis not present

## 2016-08-11 DIAGNOSIS — E1165 Type 2 diabetes mellitus with hyperglycemia: Secondary | ICD-10-CM | POA: Diagnosis not present

## 2016-08-24 ENCOUNTER — Telehealth: Payer: Self-pay | Admitting: Orthopaedic Surgery

## 2016-08-24 ENCOUNTER — Ambulatory Visit: Payer: Self-pay | Admitting: Orthopaedic Surgery

## 2016-08-24 MED ORDER — HYDROCODONE-ACETAMINOPHEN 7.5-325 MG PO TABS: 1.0000 | ORAL_TABLET | Freq: Four times a day (QID) | ORAL | 0 refills | Status: DC | PRN

## 2016-08-24 NOTE — Telephone Encounter (Signed)
Hydrocodone-Acetaminophen  7.5/325mg   Qty 110 Tablets

## 2016-09-22 DIAGNOSIS — R05 Cough: Secondary | ICD-10-CM | POA: Diagnosis not present

## 2016-09-22 DIAGNOSIS — J019 Acute sinusitis, unspecified: Secondary | ICD-10-CM | POA: Diagnosis not present

## 2016-09-28 DIAGNOSIS — E1165 Type 2 diabetes mellitus with hyperglycemia: Secondary | ICD-10-CM | POA: Diagnosis not present

## 2016-10-04 ENCOUNTER — Ambulatory Visit: Payer: Self-pay | Admitting: Cardiovascular Disease

## 2016-10-04 ENCOUNTER — Encounter: Payer: Self-pay | Admitting: Cardiovascular Disease

## 2016-10-06 ENCOUNTER — Telehealth: Payer: Self-pay

## 2016-10-06 ENCOUNTER — Ambulatory Visit: Payer: Self-pay | Admitting: Orthopaedic Surgery

## 2016-10-06 MED ORDER — FLUCONAZOLE 150 MG PO TABS: 150.0000 mg | ORAL_TABLET | Freq: Once | ORAL | 0 refills | Status: AC

## 2016-10-06 NOTE — Telephone Encounter (Signed)
Left voicemail letting pt know Rx sent to pharmacy

## 2016-10-06 NOTE — Telephone Encounter (Signed)
I sent it  f/u if no improvement

## 2016-10-06 NOTE — Telephone Encounter (Signed)
Pt left v/m requesting diflucan for yeast infection. Pt took abx for 4 days;now pt has vaginal discharge and perineal itching and irritation. [facility] [place].

## 2016-10-24 ENCOUNTER — Telehealth: Payer: Self-pay | Admitting: Orthopaedic Surgery

## 2016-12-04 ENCOUNTER — Other Ambulatory Visit: Payer: Self-pay | Admitting: Physician Assistant

## 2016-12-05 NOTE — Telephone Encounter (Signed)
Please review for refill, thanks ! 

## 2016-12-06 ENCOUNTER — Encounter: Payer: Self-pay | Admitting: Orthopaedic Surgery

## 2016-12-06 ENCOUNTER — Ambulatory Visit (INDEPENDENT_AMBULATORY_CARE_PROVIDER_SITE_OTHER): Payer: 59 | Admitting: Orthopaedic Surgery

## 2016-12-06 DIAGNOSIS — F17210 Nicotine dependence, cigarettes, uncomplicated: Secondary | ICD-10-CM | POA: Diagnosis not present

## 2016-12-06 DIAGNOSIS — M25562 Pain in left knee: Secondary | ICD-10-CM

## 2016-12-06 DIAGNOSIS — G8929 Other chronic pain: Secondary | ICD-10-CM

## 2016-12-06 DIAGNOSIS — M25561 Pain in right knee: Secondary | ICD-10-CM | POA: Diagnosis not present

## 2016-12-06 MED ORDER — HYDROCODONE-ACETAMINOPHEN 7.5-325 MG PO TABS: 1.0000 | ORAL_TABLET | Freq: Four times a day (QID) | ORAL | 0 refills | Status: DC | PRN

## 2016-12-06 NOTE — Patient Instructions (Signed)
Steps to Quit Smoking Smoking tobacco can be bad for your health. It can also affect almost every organ in your body. Smoking puts you and people around you at risk for many serious [place]-lasting (chronic) diseases. Quitting smoking is hard, but it is one of the best things that you can do for your health. It is never too late to quit. What are the benefits of quitting smoking? When you quit smoking, you lower your risk for getting serious diseases and conditions. They can include:  Lung cancer or lung disease.  Heart disease.  Stroke.  Heart attack.  Not being able to have children (infertility).  Weak bones (osteoporosis) and broken bones (fractures).  If you have coughing, wheezing, and shortness of breath, those symptoms may get better when you quit. You may also get sick less often. If you are pregnant, quitting smoking can help to lower your chances of having a baby of low birth weight. What can I do to help me quit smoking? Talk with your doctor about what can help you quit smoking. Some things you can do (strategies) include:  Quitting smoking totally, instead of slowly cutting back how much you smoke over a period of time.  Going to in-person counseling. You are more likely to quit if you go to many counseling sessions.  Using resources and support systems, such as: ? Online chats with a counselor. ? Phone quitlines. ? Printed self-help materials. ? Support groups or group counseling. ? Text messaging programs. ? Mobile phone apps or applications.  Taking medicines. Some of these medicines may have nicotine in them. If you are pregnant or breastfeeding, do not take any medicines to quit smoking unless your doctor says it is okay. Talk with your doctor about counseling or other things that can help you.  Talk with your doctor about using more than one strategy at the same time, such as taking medicines while you are also going to in-person counseling. This can help make  quitting easier. What things can I do to make it easier to quit? Quitting smoking might feel very hard at first, but there is a lot that you can do to make it easier. Take these steps:  Talk to your family and friends. Ask them to support and encourage you.  Call phone quitlines, reach out to support groups, or work with a counselor.  Ask people who smoke to not smoke around you.  Avoid places that make you want (trigger) to smoke, such as: ? Bars. ? Parties. ? Smoke-break areas at work.  Spend time with people who do not smoke.  Lower the stress in your life. Stress can make you want to smoke. Try these things to help your stress: ? Getting regular exercise. ? Deep-breathing exercises. ? Yoga. ? Meditating. ? Doing a body scan. To do this, close your eyes, focus on one area of your body at a time from head to toe, and notice which parts of your body are tense. Try to relax the muscles in those areas.  Download or buy apps on your mobile phone or tablet that can help you stick to your quit plan. There are many free apps, such as QuitGuide from the CDC (Centers for Disease Control and Prevention). You can find more support from smokefree.gov and other websites.  This information is not intended to replace advice given to you by your health care provider. Make sure you discuss any questions you have with your health care provider. Document Released: 03/26/2009 Document   Revised: 01/26/2016 Document Reviewed: 10/14/2014 Elsevier Interactive Patient Education  2018 Elsevier Inc.  

## 2016-12-06 NOTE — Progress Notes (Signed)
CC: Both of my knees are hurting. I would like an injection in both knees.  The patient has had chronic pain and tenderness of both knees for some time.  Injections help.  There is no locking or giving way of the knee.  There is no new trauma. There is no redness or signs of infections.  The knees have a mild effusion and some crepitus.  There is no redness or signs of recent trauma.  Right knee ROM is 0-105 and left knee ROM is 0-110.  Impression:  Chronic pain of the both knees  Return:  3 months  PROCEDURE NOTE:  The patient requests injections of both knees, verbal consent was obtained.  The left and right knee were individually prepped appropriately after time out was performed.   Sterile technique was observed and injection of 1 cc of Depo-Medrol 40 mg with several cc's of plain xylocaine. Anesthesia was provided by ethyl chloride and a 20-gauge needle was used to inject each knee area. The injections were tolerated well.  A band aid dressing was applied.  The patient was advised to apply ice later today and tomorrow to the injection sight as needed.  I have reviewed the [place] web site prior to prescribing narcotic medicine for this patient.  She continues to smoke but is cutting back.  Encounter Diagnoses  Name Primary?  . Chronic pain of right knee Yes  . Chronic pain of left knee   . Cigarette nicotine dependence without complication    Electronically Signed Sanjuana Kava, MD 6/26/20188:44 AM

## 2017-01-06 DIAGNOSIS — L97909 Non-pressure chronic ulcer of unspecified part of unspecified lower leg with unspecified severity: Secondary | ICD-10-CM | POA: Diagnosis not present

## 2017-01-12 ENCOUNTER — Encounter: Payer: Self-pay | Admitting: Cardiovascular Disease

## 2017-01-12 ENCOUNTER — Other Ambulatory Visit: Payer: Self-pay | Admitting: Cardiovascular Disease

## 2017-01-12 ENCOUNTER — Ambulatory Visit (INDEPENDENT_AMBULATORY_CARE_PROVIDER_SITE_OTHER): Payer: 59 | Admitting: Cardiovascular Disease

## 2017-01-12 VITALS — BP 144/80 | HR 79 | Ht 67.0 in | Wt 256.0 lb

## 2017-01-12 DIAGNOSIS — E782 Mixed hyperlipidemia: Secondary | ICD-10-CM | POA: Diagnosis not present

## 2017-01-12 DIAGNOSIS — Z955 Presence of coronary angioplasty implant and graft: Secondary | ICD-10-CM | POA: Diagnosis not present

## 2017-01-12 DIAGNOSIS — R600 Localized edema: Secondary | ICD-10-CM | POA: Diagnosis not present

## 2017-01-12 DIAGNOSIS — I10 Essential (primary) hypertension: Secondary | ICD-10-CM | POA: Diagnosis not present

## 2017-01-12 DIAGNOSIS — I25118 Atherosclerotic heart disease of native coronary artery with other forms of angina pectoris: Secondary | ICD-10-CM

## 2017-01-12 DIAGNOSIS — I252 Old myocardial infarction: Secondary | ICD-10-CM

## 2017-01-12 MED ORDER — FUROSEMIDE 40 MG PO TABS: 40.0000 mg | ORAL_TABLET | Freq: Every day | ORAL | 3 refills | Status: DC

## 2017-01-12 MED ORDER — TICAGRELOR 60 MG PO TABS: 60.0000 mg | ORAL_TABLET | Freq: Two times a day (BID) | ORAL | 3 refills | Status: DC

## 2017-01-12 MED ORDER — LISINOPRIL 20 MG PO TABS: 20.0000 mg | ORAL_TABLET | Freq: Every day | ORAL | 3 refills | Status: DC

## 2017-01-12 NOTE — Progress Notes (Signed)
SUBJECTIVE: The patient returns for routine follow-up. She was hospitalized in May 2017 for a non-STEMI and underwent right coronary artery drug-eluting stent placement with 2 overlapping stents. There was a mid RCA lesion of 60% treated medically. The second obtuse marginal branch had a 99% stenosis but was not amenable to PCI.  Echocardiogram 11/04/15 showed normal left ventricular systolic function and regional wall motion, EF 60-65%, with grade 1 diastolic dysfunction.  She is doing well overall. She denies chest pain. Her blood pressures have been elevated. She was recently seen at an urgent care and blood pressure was 170/97. It is 144/80 today in our office. Blood sugars have been better controlled with an insulin pump.  ECG performed in the office today which I ordered and personally interpreted demonstrates normal sinus rhythm with no ischemic ST segment or T-wave abnormalities, nor any arrhythmias.    Review of Systems: As per "subjective", otherwise negative.  No Known Allergies  Current Outpatient Prescriptions  Medication Sig Dispense Refill  . aspirin EC 81 MG EC tablet Take 1 tablet (81 mg total) by mouth daily.    Marland Kitchen atorvastatin (LIPITOR) 80 MG tablet TAKE ONE TABLET BY MOUTH ONCE DAILY 30 tablet 0  . BRILINTA 90 MG TABS tablet TAKE ONE TABLET BY MOUTH TWICE DAILY 60 tablet 0  . buPROPion (WELLBUTRIN SR) 150 MG 12 hr tablet Take 1 tablet (150 mg total) by mouth 2 (two) times daily. 180 tablet 3  . carvedilol (COREG) 12.5 MG tablet TAKE ONE TABLET BY MOUTH TWICE DAILY WITH A MEAL 60 tablet 0  . diclofenac (VOLTAREN) 75 MG EC tablet TAKE ONE TABLET BY MOUTH TWICE DAILY WITH A MEAL 60 tablet 5  . Diclofenac Sodium 3 % GEL Use to knees, 4gm per dose, tid. 5 Tube 5  . DULoxetine (CYMBALTA) 60 MG capsule Take 1 capsule (60 mg total) by mouth daily. 90 capsule 3  . furosemide (LASIX) 20 MG tablet Take 1 tablet (20 mg total) by mouth daily. Take 20 mg daily AS NEEDED FOR LEG  SWELLING. 90 tablet 3  . HYDROcodone-acetaminophen (NORCO) 7.5-325 MG tablet Take 1 tablet by mouth every 6 (six) hours as needed for moderate pain (Must last 30 days.Do not drive or operate machinery while taking this medicine.). 85 tablet 0  . insulin lispro (HUMALOG) 100 UNIT/ML injection Inject 75 Units into the skin 2 (two) times daily.     Marland Kitchen levothyroxine (SYNTHROID, LEVOTHROID) 112 MCG tablet Take 112 mcg by mouth every evening.     . nitroGLYCERIN (NITROSTAT) 0.4 MG SL tablet Place 1 tablet (0.4 mg total) under the tongue every 5 (five) minutes x 3 doses as needed for chest pain. 25 tablet 3  . sitaGLIPtin (JANUVIA) 100 MG tablet Take 100 mg by mouth every evening.     Marland Kitchen tiZANidine (ZANAFLEX) 4 MG tablet One by mouth every 8 hours as needed for spasm 90 tablet 3  . lisinopril (PRINIVIL,ZESTRIL) 10 MG tablet Take 1 tablet (10 mg total) by mouth daily. 90 tablet 3   No current facility-administered medications for this visit.     Past Medical History:  Diagnosis Date  . CAD (coronary artery disease)    cath 5/23 100% dist RCA lesion treated with 2 overlapping Integrity Resolute DES ( 2.25x37mm, 2.25x79mm), 60% mid RCA treated medically, 99% OM2 not amenable to PCI, 70% D1 lesion, EF normal  . Diabetes mellitus without complication ([place])   . Hypercholesteremia   . Hypertension   .  Thyroid disease     Past Surgical History:  Procedure Laterality Date  . ABDOMINAL HYSTERECTOMY    . CARDIAC CATHETERIZATION N/A 11/03/2015   Procedure: Left Heart Cath and Coronary Angiography;  Surgeon: Leonie Man, MD;  Location: [facility];  Service: Cardiovascular;  Laterality: N/A;  . CARDIAC CATHETERIZATION N/A 11/03/2015   Procedure: Coronary Stent Intervention;  Surgeon: Leonie Man, MD;  Location: [facility];  Service: Cardiovascular;  Laterality: N/A;  . CARPAL TUNNEL RELEASE      Social History   Social History  . Marital status: Married    Spouse name: N/A  .  Number of children: N/A  . Years of education: N/A   Occupational History  . Not on file.   Social History Main Topics  . Smoking status: Current Some Day Smoker    Packs/day: 0.25    Types: Cigarettes  . Smokeless tobacco: Never Used  . Alcohol use No  . Drug use: No  . Sexual activity: Not on file   Other Topics Concern  . Not on file   Social History Narrative  . No narrative on file     Vitals:   01/12/17 1608  BP: (!) 144/80  Pulse: 79  SpO2: 94%  Weight: 256 lb (116.1 kg)  Height: 5\' 7"  (1.702 m)    Wt Readings from Last 3 Encounters:  01/12/17 256 lb (116.1 kg)  07/07/16 269 lb (122 kg)  07/06/16 289 lb (131.1 kg)     PHYSICAL EXAM General: NAD HEENT: Normal. Neck: No JVD, no thyromegaly. Lungs: Clear to auscultation bilaterally with normal respiratory effort. CV: Nondisplaced PMI.  Regular rate and rhythm, normal S1/S2, no S3/S4, no murmur. No pretibial or periankle edema.  No carotid bruit.   Abdomen: Soft, nontender, protuberant. Neurologic: Alert and oriented.  Psych: Normal affect. Skin: Normal. Musculoskeletal: No gross deformities.    ECG: Most recent ECG reviewed.   Labs: Lab Results  Component Value Date/Time   K 3.6 11/04/2015 04:20 AM   BUN 10 11/04/2015 04:20 AM   CREATININE 0.81 11/04/2015 04:20 AM   ALT 20 11/03/2015 10:00 PM   TSH 5.253 (H) 11/03/2015 10:00 PM   TSH 1.73 11/04/2011 10:18 AM   HGB 12.5 11/05/2015 03:25 AM     Lipids: Lab Results  Component Value Date/Time   LDLCALC 79 11/04/2015 04:20 AM   LDLDIRECT 154.2 11/04/2011 10:18 AM   CHOL 154 11/04/2015 04:20 AM   TRIG 190 (H) 11/04/2015 04:20 AM   HDL 37 (L) 11/04/2015 04:20 AM       ASSESSMENT AND PLAN:  1. CAD with NSTEMI and PCI: Symptomatically stable. Continue aspirin, Lipitor, Coreg, lisinopril (increase to 20 mg), and Brilinta. Will reduce Brilinta to 60 mg bid.  2. Essential HTN: Blood pressures have been elevated as detailed above. I will  increase lisinopril to 20 mg daily.  3. Hyperlipidemia: On Lipitor 80 mg.  4. Bilateral leg edema: Continue Lasix 40 mg daily.     Disposition: Follow up 6 months.   Kate Sable, M.D., F.A.C.C.

## 2017-01-12 NOTE — Patient Instructions (Signed)
Medication Instructions:   Increase Lisinopril to 20mg  daily.  Increase Lasix to 40mg  daily.  Decrease Brilinta to 60mg  twice a day.  Office will contact with results via phone or letter.    Labwork: none  Testing/Procedures: none  Follow-Up: Your physician wants you to follow up in: 6 months.  You will receive a reminder letter in the mail one-two months in advance.  If you don't receive a letter, please call our office to schedule the follow up appointment   Any Other Special Instructions Will Be Listed Below (If Applicable).  If you need a refill on your cardiac medications before your next appointment, please call your pharmacy.

## 2017-01-19 ENCOUNTER — Encounter (HOSPITAL_COMMUNITY): Payer: Self-pay | Admitting: Emergency Medicine

## 2017-01-19 ENCOUNTER — Emergency Department (HOSPITAL_COMMUNITY): Payer: 59

## 2017-01-19 ENCOUNTER — Emergency Department (HOSPITAL_COMMUNITY)
Admission: EM | Admit: 2017-01-19 | Discharge: 2017-01-20 | Disposition: A | Discharge status: Home / Self Care | Payer: 59 | Attending: Emergency Medicine | Admitting: Emergency Medicine

## 2017-01-19 DIAGNOSIS — I10 Essential (primary) hypertension: Secondary | ICD-10-CM | POA: Diagnosis not present

## 2017-01-19 DIAGNOSIS — Z79899 Other long term (current) drug therapy: Secondary | ICD-10-CM | POA: Diagnosis not present

## 2017-01-19 DIAGNOSIS — E1140 Type 2 diabetes mellitus with diabetic neuropathy, unspecified: Secondary | ICD-10-CM | POA: Insufficient documentation

## 2017-01-19 DIAGNOSIS — F17200 Nicotine dependence, unspecified, uncomplicated: Secondary | ICD-10-CM | POA: Diagnosis not present

## 2017-01-19 DIAGNOSIS — M62830 Muscle spasm of back: Secondary | ICD-10-CM

## 2017-01-19 DIAGNOSIS — J449 Chronic obstructive pulmonary disease, unspecified: Secondary | ICD-10-CM | POA: Diagnosis not present

## 2017-01-19 DIAGNOSIS — I2510 Atherosclerotic heart disease of native coronary artery without angina pectoris: Secondary | ICD-10-CM | POA: Diagnosis not present

## 2017-01-19 DIAGNOSIS — Z794 Long term (current) use of insulin: Secondary | ICD-10-CM | POA: Insufficient documentation

## 2017-01-19 DIAGNOSIS — E039 Hypothyroidism, unspecified: Secondary | ICD-10-CM | POA: Insufficient documentation

## 2017-01-19 DIAGNOSIS — Z7982 Long term (current) use of aspirin: Secondary | ICD-10-CM | POA: Diagnosis not present

## 2017-01-19 DIAGNOSIS — M545 Low back pain, unspecified: Secondary | ICD-10-CM

## 2017-01-19 LAB — COMPREHENSIVE METABOLIC PANEL WITH GFR
ALT: 23 U/L (ref 14–54)
AST: 19 U/L (ref 15–41)
Albumin: 3 g/dL — ABNORMAL LOW (ref 3.5–5.0)
Alkaline Phosphatase: 63 U/L (ref 38–126)
Anion gap: 8 (ref 5–15)
BUN: 29 mg/dL — ABNORMAL HIGH (ref 6–20)
CO2: 26 mmol/L (ref 22–32)
Calcium: 8.8 mg/dL — ABNORMAL LOW (ref 8.9–10.3)
Chloride: 104 mmol/L (ref 101–111)
Creatinine, Ser: 1.22 mg/dL — ABNORMAL HIGH (ref 0.44–1.00)
GFR calc Af Amer: 54 mL/min — ABNORMAL LOW
GFR calc non Af Amer: 47 mL/min — ABNORMAL LOW
Glucose, Bld: 210 mg/dL — ABNORMAL HIGH (ref 65–99)
Potassium: 4.1 mmol/L (ref 3.5–5.1)
Sodium: 138 mmol/L (ref 135–145)
Total Bilirubin: 0.4 mg/dL (ref 0.3–1.2)
Total Protein: 5.8 g/dL — ABNORMAL LOW (ref 6.5–8.1)

## 2017-01-19 LAB — URINALYSIS, ROUTINE W REFLEX MICROSCOPIC
Bilirubin Urine: NEGATIVE
Glucose, UA: NEGATIVE mg/dL
Hgb urine dipstick: NEGATIVE
KETONES UR: NEGATIVE mg/dL
Leukocytes, UA: NEGATIVE
Nitrite: NEGATIVE
Protein, ur: >=300 mg/dL — AB
SPECIFIC GRAVITY, URINE: 1.019 (ref 1.005–1.030)
pH: 5 (ref 5.0–8.0)

## 2017-01-19 LAB — CBC WITH DIFFERENTIAL/PLATELET
Basophils Absolute: 0 10*3/uL (ref 0.0–0.1)
Basophils Relative: 0 %
Eosinophils Absolute: 0.1 10*3/uL (ref 0.0–0.7)
Eosinophils Relative: 1 %
HCT: 38 % (ref 36.0–46.0)
Hemoglobin: 12.3 g/dL (ref 12.0–15.0)
Lymphocytes Relative: 24 %
Lymphs Abs: 2.4 10*3/uL (ref 0.7–4.0)
MCH: 27.8 pg (ref 26.0–34.0)
MCHC: 32.4 g/dL (ref 30.0–36.0)
MCV: 85.8 fL (ref 78.0–100.0)
Monocytes Absolute: 0.6 10*3/uL (ref 0.1–1.0)
Monocytes Relative: 6 %
Neutro Abs: 6.9 10*3/uL (ref 1.7–7.7)
Neutrophils Relative %: 69 %
Platelets: 219 10*3/uL (ref 150–400)
RBC: 4.43 MIL/uL (ref 3.87–5.11)
RDW: 14.5 % (ref 11.5–15.5)
WBC: 10 10*3/uL (ref 4.0–10.5)

## 2017-01-19 LAB — LIPASE, BLOOD: Lipase: 22 U/L (ref 11–51)

## 2017-01-19 MED ORDER — HYDROCODONE-ACETAMINOPHEN 5-325 MG PO TABS
1.0000 | ORAL_TABLET | Freq: Once | ORAL | Status: AC
  Administered 2017-01-19: 1 via ORAL
  Filled 2017-01-19: qty 1

## 2017-01-19 NOTE — ED Triage Notes (Signed)
Pt c/o left flank pain that started today that radiates to left back and left lower abd.

## 2017-01-20 MED ORDER — HYDROCODONE-ACETAMINOPHEN 5-325 MG PO TABS: 1.0000 | ORAL_TABLET | Freq: Four times a day (QID) | ORAL | 0 refills | Status: DC | PRN

## 2017-01-20 MED ORDER — CYCLOBENZAPRINE HCL 10 MG PO TABS
10.0000 mg | ORAL_TABLET | Freq: Once | ORAL | Status: AC
  Administered 2017-01-20: 10 mg via ORAL
  Filled 2017-01-20: qty 1

## 2017-01-20 NOTE — ED Provider Notes (Signed)
[facility] DEPT Provider Note   CSN: [id number] Arrival date & time: 01/19/17  2046     History   Chief Complaint Chief Complaint  Patient presents with  . Flank Pain    HPI Jennifer [name] is a 61 y.o. female with a history of DM, CAD with MI in 2017 with PCI presenting with sudden onset of left lower back pain described as intense and spasm like quality and is worsened with palpation and movement.  She was sitting in a chair at work when the pain started around noon today. The pain radiates into her left side and is constant, worsened with applying pressure to the back and better when sitting upright so no pressure of the chair is against her back. She does have occasional low back pain but denies any new injuries and denies midline back pain.  She also denies dysuria, hematuria,urinary or bowel incontinence, perineal numbness, chest or abdominal pain or distention, sob,  n/v fevers radiation of pain into her pelvis or legs.  She took ibuprofen earlier today with improvement in pain.  The history is provided by the patient.    Past Medical History:  Diagnosis Date  . CAD (coronary artery disease)    cath 5/23 100% dist RCA lesion treated with 2 overlapping Integrity Resolute DES ( 2.25x83mm, 2.25x57mm), 60% mid RCA treated medically, 99% OM2 not amenable to PCI, 70% D1 lesion, EF normal  . Diabetes mellitus without complication ([place])   . Hypercholesteremia   . Hypertension   . Thyroid disease     Patient Active Problem List   Diagnosis Date Noted  . CAD (coronary artery disease) 11/04/2015  . NSTEMI (non-ST elevated myocardial infarction) ([place]) 11/03/2015  . LEUKOCYTOSIS 08/16/2010  . Hypothyroidism 07/06/2010  . Hyperlipidemia LDL goal <100 08/28/2008  . Essential hypertension 11/13/2007  . Diabetes type 2, controlled ([place]) 09/21/2006  . DIABETIC PERIPHERAL NEUROPATHY 09/21/2006  . Morbid obesity ([place]) 09/21/2006  . ANXIETY 09/21/2006  . TOBACCO ABUSE 09/21/2006  .  Adjustment disorder with mixed anxiety and depressed mood 09/21/2006  . CARPAL TUNNEL SYNDROME 09/21/2006  . COPD (chronic obstructive pulmonary disease) ([place]) 09/21/2006  . EDEMA 09/21/2006  . MIGRAINES, HX OF 09/21/2006    Past Surgical History:  Procedure Laterality Date  . ABDOMINAL HYSTERECTOMY    . CARDIAC CATHETERIZATION N/A 11/03/2015   Procedure: Left Heart Cath and Coronary Angiography;  Surgeon: Leonie Man, MD;  Location: [facility];  Service: Cardiovascular;  Laterality: N/A;  . CARDIAC CATHETERIZATION N/A 11/03/2015   Procedure: Coronary Stent Intervention;  Surgeon: Leonie Man, MD;  Location: [facility];  Service: Cardiovascular;  Laterality: N/A;  . CARPAL TUNNEL RELEASE      OB History    No data available       Home Medications    Prior to Admission medications   Medication Sig Start Date End Date Taking? Authorizing Provider  aspirin EC 81 MG EC tablet Take 1 tablet (81 mg total) by mouth daily. 11/05/15  Yes Almyra Deforest, PA  atorvastatin (LIPITOR) 80 MG tablet Take 1 tablet (80 mg total) by mouth daily at 6 PM. 01/13/17  Yes Herminio Commons, MD  buPROPion (WELLBUTRIN SR) 150 MG 12 hr tablet Take 1 tablet (150 mg total) by mouth 2 (two) times daily. 07/05/16  Yes [name], [name], MD  carvedilol (COREG) 12.5 MG tablet TAKE 1 TABLET BY MOUTH TWICE DAILY WITH A MEAL 01/13/17  Yes Herminio Commons, MD  diclofenac (VOLTAREN) 75 MG EC tablet TAKE ONE TABLET BY MOUTH TWICE DAILY WITH A MEAL 10/25/16  Yes Sanjuana Kava, MD  Diclofenac Sodium 3 % GEL Use to knees, 4gm per dose, tid. Patient taking differently: Apply 4 g topically 4 (four) times daily as needed (for pain).  07/07/16  Yes Sanjuana Kava, MD  DULoxetine (CYMBALTA) 60 MG capsule Take 1 capsule (60 mg total) by mouth daily. 07/05/16  Yes [name], [name], MD  furosemide (LASIX) 40 MG tablet Take 1 tablet (40 mg total) by mouth daily. 01/12/17  Yes Herminio Commons, MD  insulin lispro  (HUMALOG) 100 UNIT/ML injection Inject 40 Units into the skin daily as needed for high blood sugar.    Yes [provider]  levothyroxine (SYNTHROID, LEVOTHROID) 112 MCG tablet Take 112 mcg by mouth every evening.    Yes [provider]  lisinopril (PRINIVIL,ZESTRIL) 20 MG tablet Take 1 tablet (20 mg total) by mouth daily. 01/12/17 04/12/17 Yes Herminio Commons, MD  nitroGLYCERIN (NITROSTAT) 0.4 MG SL tablet Place 1 tablet (0.4 mg total) under the tongue every 5 (five) minutes x 3 doses as needed for chest pain. 11/05/15  Yes Almyra Deforest, PA  sitaGLIPtin (JANUVIA) 100 MG tablet Take 100 mg by mouth every evening.    Yes [provider]  ticagrelor (BRILINTA) 60 MG TABS tablet Take 1 tablet (60 mg total) by mouth 2 (two) times daily. 01/12/17  Yes Herminio Commons, MD  HYDROcodone-acetaminophen (NORCO/VICODIN) 5-325 MG tablet Take 1 tablet by mouth every 6 (six) hours as needed for moderate pain. 01/20/17   Evalee Jefferson, PA-C  tiZANidine (ZANAFLEX) 4 MG tablet One by mouth every 8 hours as needed for spasm Patient not taking: Reported on 01/19/2017 09/17/15   Sanjuana Kava, MD    Family History Family History  Problem Relation Age of Onset  . Heart disease Mother   . Stroke Sister   . Heart attack Brother     Social History Social History  Substance Use Topics  . Smoking status: Current Some Day Smoker    Packs/day: 0.25    Types: Cigarettes  . Smokeless tobacco: Never Used  . Alcohol use No     Allergies   Patient has no known allergies.   Review of Systems Review of Systems  Constitutional: Negative for fever.  Respiratory: Negative for shortness of breath.   Cardiovascular: Negative for chest pain and leg swelling.  Gastrointestinal: Negative for abdominal distention, abdominal pain, constipation, nausea and vomiting.  Genitourinary: Negative for difficulty urinating, dysuria, flank pain, frequency and urgency.  Musculoskeletal: Positive for back  pain. Negative for gait problem, joint swelling and neck stiffness.  Skin: Negative for rash.  Neurological: Negative for weakness and numbness.     Physical Exam Updated Vital Signs BP (!) 154/67   Pulse 79   Temp 98.4 F (36.9 C)   Resp 20   Ht 5\' 7"  (1.702 m)   Wt 116.6 kg (257 lb)   SpO2 92%   BMI 40.25 kg/m   Physical Exam  Constitutional: She appears well-developed and well-nourished.  HENT:  Head: Normocephalic.  Eyes: Conjunctivae are normal.  Neck: Normal range of motion. Neck supple.  Cardiovascular: Normal rate and intact distal pulses.   Pedal pulses normal.  Pulmonary/Chest: Effort normal.  Abdominal: Soft. Bowel sounds are normal. She exhibits no distension and no mass. There is no tenderness. There is no rigidity, no rebound and no guarding.  Musculoskeletal: Normal range of motion. She  exhibits no edema.       Lumbar back: She exhibits tenderness and spasm. She exhibits no swelling and no edema.  Reproducible pain with palpation left lower paralumbar area with muscle spasm appreciated.  No midline ttp.  No cva tenderness.  Neurological: She is alert. She has normal strength. She displays no atrophy and no tremor. No sensory deficit. Gait normal.  Reflex Scores:      Patellar reflexes are 2+ on the right side and 2+ on the left side.      Achilles reflexes are 2+ on the right side and 2+ on the left side. No strength deficit noted in hip and knee flexor and extensor muscle groups.  Ankle flexion and extension intact.  Skin: Skin is warm and dry.  Psychiatric: She has a normal mood and affect.  Nursing note and vitals reviewed.    ED Treatments / Results  Labs (all labs ordered are listed, but only abnormal results are displayed) Labs Reviewed  URINALYSIS, ROUTINE W REFLEX MICROSCOPIC - Abnormal; Notable for the following:       Result Value   APPearance HAZY (*)    Protein, ur >=300 (*)    Bacteria, UA RARE (*)    Squamous Epithelial / LPF 0-5 (*)      All other components within normal limits  COMPREHENSIVE METABOLIC PANEL - Abnormal; Notable for the following:    Glucose, Bld 210 (*)    BUN 29 (*)    Creatinine, Ser 1.22 (*)    Calcium 8.8 (*)    Total Protein 5.8 (*)    Albumin 3.0 (*)    GFR calc non Af Amer 47 (*)    GFR calc Af Amer 54 (*)    All other components within normal limits  CBC WITH DIFFERENTIAL/PLATELET  LIPASE, BLOOD    EKG  EKG Interpretation None       Radiology Dg Lumbar Spine Complete  Result Date: 01/20/2017 CLINICAL DATA:  Acute onset of lower back pain, radiating to the left side. Initial encounter. EXAM: LUMBAR SPINE - COMPLETE 4+ VIEW COMPARISON:  None. FINDINGS: There is no evidence of fracture or subluxation. Vertebral bodies demonstrate normal height and alignment. Mild intervertebral disc space narrowing is noted at L2-L3. Scattered anterior and lateral osteophytes are seen along the lower thoracic and lumbar spine. The visualized bowel gas pattern is unremarkable in appearance; air and stool are noted within the colon. The sacroiliac joints are within normal limits. Clips are noted within the right upper quadrant, reflecting prior cholecystectomy. Scattered vascular calcifications are seen. IMPRESSION: 1. No evidence of acute fracture or subluxation along the lumbar spine. 2. Mild degenerative change along the lower thoracic and lumbar spine. 3. Scattered aortic atherosclerosis. Electronically Signed   By: Garald Balding M.D.   On: 01/20/2017 00:04    Procedures Procedures (including critical care time)  Medications Ordered in ED Medications  HYDROcodone-acetaminophen (NORCO/VICODIN) 5-325 MG per tablet 1 tablet (1 tablet Oral Given 01/19/17 2316)  cyclobenzaprine (FLEXERIL) tablet 10 mg (10 mg Oral Given 01/20/17 0100)     Initial Impression / Assessment and Plan / ED Course  I have reviewed the triage vital signs and the nursing notes.  Pertinent labs & imaging results that were available  during my care of the patient were reviewed by me and considered in my medical decision making (see chart for details).     Imaging reviewed and discussed with pt.  Hx and exam with reproducible left paralumbar pain suggesting  acute paralumbar muscle spasm.  No hematuria or urinary sx to suggest ureteral stones.  Abd soft and nontender, no masses.  Pt denies cp or sob. She was given a hydrocodone here with improved pain while awaiting imaging, still with worsened spasm with movement, given flexeril prior to dc.  She has zanaflex at home (for occasional lower extremity spasms, not currently using but are current). Encouraged use of these meds for the next several days in addition to few hydrocodone and heat tx to lower back.  F/u with pcp if not improved over the next week.    Final Clinical Impressions(s) / ED Diagnoses   Final diagnoses:  Acute left-sided low back pain without sciatica  Muscle spasm of back    New Prescriptions Discharge Medication List as of 01/20/2017 12:44 AM    START taking these medications   Details  HYDROcodone-acetaminophen (NORCO/VICODIN) 5-325 MG tablet Take 1 tablet by mouth every 6 (six) hours as needed for moderate pain., Starting Fri 01/20/2017, Print         Evalee Jefferson, PA-C 01/20/17 1442    Daleen Bo, MD 01/20/17 [phone]

## 2017-01-20 NOTE — Discharge Instructions (Signed)
Take the medicine as prescribed if needed for pain relief - this will make you drowsy - do not drive within 4 hours of taking this medicine.  You can add your zanaflex as well, it would be better to take the muscle relaxer separated by 2 hours from your pain medicine.  Apply a heating pad 20 minutes several times daily to your lower back.  Your kidney function is a little reduced today as discussed, but also could be from mild dehydration - make sure you are drinking plenty of fluids.  You will need to have your kidney function rechecked by your doctor with your next visit.

## 2017-02-02 ENCOUNTER — Telehealth: Payer: Self-pay | Admitting: Orthopaedic Surgery

## 2017-02-02 MED ORDER — HYDROCODONE-ACETAMINOPHEN 5-325 MG PO TABS: 1.0000 | ORAL_TABLET | Freq: Four times a day (QID) | ORAL | 0 refills | Status: DC | PRN

## 2017-02-02 NOTE — Telephone Encounter (Signed)
Patient called for refill of  HYDROcodone-acetaminophen (NORCO/VICODIN) 5-325 MG tablet 15 tablet   states was prescribed "only 15" by physician at [facility] emergency department 01/20/17

## 2017-02-23 DIAGNOSIS — E1165 Type 2 diabetes mellitus with hyperglycemia: Secondary | ICD-10-CM | POA: Diagnosis not present

## 2017-03-07 ENCOUNTER — Encounter: Payer: Self-pay | Admitting: Orthopaedic Surgery

## 2017-03-07 ENCOUNTER — Ambulatory Visit (INDEPENDENT_AMBULATORY_CARE_PROVIDER_SITE_OTHER): Payer: 59 | Admitting: Orthopaedic Surgery

## 2017-03-07 VITALS — BP 168/88 | HR 68 | Wt 261.0 lb

## 2017-03-07 DIAGNOSIS — M25562 Pain in left knee: Secondary | ICD-10-CM | POA: Diagnosis not present

## 2017-03-07 DIAGNOSIS — G8929 Other chronic pain: Secondary | ICD-10-CM | POA: Diagnosis not present

## 2017-03-07 DIAGNOSIS — F17210 Nicotine dependence, cigarettes, uncomplicated: Secondary | ICD-10-CM

## 2017-03-07 DIAGNOSIS — M25561 Pain in right knee: Secondary | ICD-10-CM

## 2017-03-07 MED ORDER — HYDROCODONE-ACETAMINOPHEN 5-325 MG PO TABS: 1.0000 | ORAL_TABLET | Freq: Four times a day (QID) | ORAL | 0 refills | Status: DC | PRN

## 2017-03-07 NOTE — Progress Notes (Signed)
CC: Both of my knees are hurting. I would like an injection in both knees.  The patient has had chronic pain and tenderness of both knees for some time.  Injections help.  There is no locking or giving way of the knee.  There is no new trauma. There is no redness or signs of infections.  The knees have a mild effusion and some crepitus.  There is no redness or signs of recent trauma.  Right knee ROM is 0-100 and left knee ROM is 0-105.  Impression:  Chronic pain of the both knees  Return:  1 month  PROCEDURE NOTE:  The patient requests injections of both knees, verbal consent was obtained.  The left and right knee were individually prepped appropriately after time out was performed.   Sterile technique was observed and injection of 1 cc of Depo-Medrol 40 mg with several cc's of plain xylocaine. Anesthesia was provided by ethyl chloride and a 20-gauge needle was used to inject each knee area. The injections were tolerated well.  A band aid dressing was applied.  The patient was advised to apply ice later today and tomorrow to the injection sight as needed.  She continues to smoke and is not stopping.  Encounter Diagnoses  Name Primary?  . Chronic pain of right knee Yes  . Chronic pain of left knee   . Cigarette nicotine dependence without complication    I have reviewed the [facility] web site prior to prescribing narcotic medicine for this patient.  Electronically Signed Sanjuana Kava, MD 9/25/20188:32 AM

## 2017-03-20 DIAGNOSIS — E039 Hypothyroidism, unspecified: Secondary | ICD-10-CM | POA: Diagnosis not present

## 2017-03-20 DIAGNOSIS — I10 Essential (primary) hypertension: Secondary | ICD-10-CM | POA: Diagnosis not present

## 2017-03-20 DIAGNOSIS — Z23 Encounter for immunization: Secondary | ICD-10-CM | POA: Diagnosis not present

## 2017-03-20 DIAGNOSIS — E1165 Type 2 diabetes mellitus with hyperglycemia: Secondary | ICD-10-CM | POA: Diagnosis not present

## 2017-03-20 LAB — HEMOGLOBIN A1C: Hemoglobin A1C: 7

## 2017-04-04 ENCOUNTER — Ambulatory Visit: Payer: Self-pay | Admitting: Orthopaedic Surgery

## 2017-04-05 DIAGNOSIS — E113492 Type 2 diabetes mellitus with severe nonproliferative diabetic retinopathy without macular edema, left eye: Secondary | ICD-10-CM | POA: Diagnosis not present

## 2017-04-05 DIAGNOSIS — H43812 Vitreous degeneration, left eye: Secondary | ICD-10-CM | POA: Diagnosis not present

## 2017-04-05 DIAGNOSIS — H4311 Vitreous hemorrhage, right eye: Secondary | ICD-10-CM | POA: Diagnosis not present

## 2017-04-05 DIAGNOSIS — E113591 Type 2 diabetes mellitus with proliferative diabetic retinopathy without macular edema, right eye: Secondary | ICD-10-CM | POA: Diagnosis not present

## 2017-04-11 ENCOUNTER — Ambulatory Visit (INDEPENDENT_AMBULATORY_CARE_PROVIDER_SITE_OTHER): Payer: 59 | Admitting: Orthopaedic Surgery

## 2017-04-11 ENCOUNTER — Encounter: Payer: Self-pay | Admitting: Orthopaedic Surgery

## 2017-04-11 VITALS — BP 161/75 | HR 73 | Temp 97.9°F | Ht 67.0 in | Wt 254.0 lb

## 2017-04-11 DIAGNOSIS — F17210 Nicotine dependence, cigarettes, uncomplicated: Secondary | ICD-10-CM | POA: Diagnosis not present

## 2017-04-11 DIAGNOSIS — G8929 Other chronic pain: Secondary | ICD-10-CM | POA: Diagnosis not present

## 2017-04-11 DIAGNOSIS — M25562 Pain in left knee: Secondary | ICD-10-CM

## 2017-04-11 DIAGNOSIS — M25561 Pain in right knee: Secondary | ICD-10-CM | POA: Diagnosis not present

## 2017-04-11 MED ORDER — HYDROCODONE-ACETAMINOPHEN 5-325 MG PO TABS: 1.0000 | ORAL_TABLET | Freq: Four times a day (QID) | ORAL | 0 refills | Status: DC | PRN

## 2017-04-11 NOTE — Patient Instructions (Signed)
Steps to Quit Smoking Smoking tobacco can be bad for your health. It can also affect almost every organ in your body. Smoking puts you and people around you at risk for many serious long-lasting (chronic) diseases. Quitting smoking is hard, but it is one of the best things that you can do for your health. It is never too late to quit. What are the benefits of quitting smoking? When you quit smoking, you lower your risk for getting serious diseases and conditions. They can include:  Lung cancer or lung disease.  Heart disease.  Stroke.  Heart attack.  Not being able to have children (infertility).  Weak bones (osteoporosis) and broken bones (fractures).  If you have coughing, wheezing, and shortness of breath, those symptoms may get better when you quit. You may also get sick less often. If you are pregnant, quitting smoking can help to lower your chances of having a baby of low birth weight. What can I do to help me quit smoking? Talk with your doctor about what can help you quit smoking. Some things you can do (strategies) include:  Quitting smoking totally, instead of slowly cutting back how much you smoke over a period of time.  Going to in-person counseling. You are more likely to quit if you go to many counseling sessions.  Using resources and support systems, such as: ? Online chats with a counselor. ? Phone quitlines. ? Printed self-help materials. ? Support groups or group counseling. ? Text messaging programs. ? Mobile phone apps or applications.  Taking medicines. Some of these medicines may have nicotine in them. If you are pregnant or breastfeeding, do not take any medicines to quit smoking unless your doctor says it is okay. Talk with your doctor about counseling or other things that can help you.  Talk with your doctor about using more than one strategy at the same time, such as taking medicines while you are also going to in-person counseling. This can help make  quitting easier. What things can I do to make it easier to quit? Quitting smoking might feel very hard at first, but there is a lot that you can do to make it easier. Take these steps:  Talk to your family and friends. Ask them to support and encourage you.  Call phone quitlines, reach out to support groups, or work with a counselor.  Ask people who smoke to not smoke around you.  Avoid places that make you want (trigger) to smoke, such as: ? Bars. ? Parties. ? Smoke-break areas at work.  Spend time with people who do not smoke.  Lower the stress in your life. Stress can make you want to smoke. Try these things to help your stress: ? Getting regular exercise. ? Deep-breathing exercises. ? Yoga. ? Meditating. ? Doing a body scan. To do this, close your eyes, focus on one area of your body at a time from head to toe, and notice which parts of your body are tense. Try to relax the muscles in those areas.  Download or buy apps on your mobile phone or tablet that can help you stick to your quit plan. There are many free apps, such as QuitGuide from the CDC (Centers for Disease Control and Prevention). You can find more support from smokefree.gov and other websites.  This information is not intended to replace advice given to you by your health care provider. Make sure you discuss any questions you have with your health care provider. Document Released: 03/26/2009 Document   Revised: 01/26/2016 Document Reviewed: 10/14/2014 Elsevier Interactive Patient Education  2018 Elsevier Inc.  

## 2017-04-11 NOTE — Progress Notes (Signed)
CC: Both of my knees are hurting. I would like an injection in both knees.  The patient has had chronic pain and tenderness of both knees for some time.  Injections help.  There is no locking or giving way of the knee.  There is no new trauma. There is no redness or signs of infections.  The knees have a mild effusion and some crepitus.  There is no redness or signs of recent trauma.  Right knee ROM is 0-100 and left knee ROM is 0-105.  Impression:  Chronic pain of the both knees  Return:  1 month  PROCEDURE NOTE:  The patient requests injections of both knees, verbal consent was obtained.  The left and right knee were individually prepped appropriately after time out was performed.   Sterile technique was observed and injection of 1 cc of Depo-Medrol 40 mg with several cc's of plain xylocaine. Anesthesia was provided by ethyl chloride and a 20-gauge needle was used to inject each knee area. The injections were tolerated well.  A band aid dressing was applied.  The patient was advised to apply ice later today and tomorrow to the injection sight as needed.  I have reviewed the [facility] web site prior to prescribing narcotic medicine for this patient.  Electronically Signed Sanjuana Kava, MD 10/30/20189:34 AM

## 2017-05-09 ENCOUNTER — Ambulatory Visit: Payer: Self-pay | Admitting: Orthopaedic Surgery

## 2017-05-09 ENCOUNTER — Encounter: Payer: Self-pay | Admitting: Orthopaedic Surgery

## 2017-05-15 DIAGNOSIS — H4312 Vitreous hemorrhage, left eye: Secondary | ICD-10-CM | POA: Diagnosis not present

## 2017-05-15 DIAGNOSIS — H4311 Vitreous hemorrhage, right eye: Secondary | ICD-10-CM | POA: Diagnosis not present

## 2017-05-15 DIAGNOSIS — E113591 Type 2 diabetes mellitus with proliferative diabetic retinopathy without macular edema, right eye: Secondary | ICD-10-CM | POA: Diagnosis not present

## 2017-05-15 DIAGNOSIS — E113592 Type 2 diabetes mellitus with proliferative diabetic retinopathy without macular edema, left eye: Secondary | ICD-10-CM | POA: Diagnosis not present

## 2017-05-23 DIAGNOSIS — H4312 Vitreous hemorrhage, left eye: Secondary | ICD-10-CM | POA: Diagnosis not present

## 2017-05-23 DIAGNOSIS — E113592 Type 2 diabetes mellitus with proliferative diabetic retinopathy without macular edema, left eye: Secondary | ICD-10-CM | POA: Diagnosis not present

## 2017-06-07 DIAGNOSIS — E1165 Type 2 diabetes mellitus with hyperglycemia: Secondary | ICD-10-CM | POA: Diagnosis not present

## 2017-06-27 ENCOUNTER — Ambulatory Visit: Payer: 59 | Admitting: Orthopaedic Surgery

## 2017-06-27 ENCOUNTER — Encounter: Payer: Self-pay | Admitting: Orthopaedic Surgery

## 2017-06-27 DIAGNOSIS — M25561 Pain in right knee: Secondary | ICD-10-CM

## 2017-06-27 DIAGNOSIS — G8929 Other chronic pain: Secondary | ICD-10-CM | POA: Diagnosis not present

## 2017-06-27 MED ORDER — HYDROCODONE-ACETAMINOPHEN 5-325 MG PO TABS: 1.0000 | ORAL_TABLET | Freq: Four times a day (QID) | ORAL | 0 refills | Status: DC | PRN

## 2017-06-27 NOTE — Progress Notes (Signed)
CC:  I have pain of my right knee. I would like an injection.  The patient has chronic pain of the right knee.  There is no recent trauma.  There is no redness.  Injections in the past have helped.  The knee has no redness, has an effusion and crepitus present.  ROM of the right knee is 0-105.  Impression:  Chronic knee pain right  Return: 1 month  PROCEDURE NOTE:  The patient requests injections of the right knee , verbal consent was obtained.  The right knee was prepped appropriately after time out was performed.   Sterile technique was observed and injection of 1 cc of Depo-Medrol 40 mg with several cc's of plain xylocaine. Anesthesia was provided by ethyl chloride and a 20-gauge needle was used to inject the knee area. The injection was tolerated well.  A band aid dressing was applied.  The patient was advised to apply ice later today and tomorrow to the injection sight as needed.  I have reviewed the [facility] web site prior to prescribing narcotic medicine for this patient.  Electronically Signed Sanjuana Kava, MD 1/15/20199:14 AM

## 2017-07-05 DIAGNOSIS — H4311 Vitreous hemorrhage, right eye: Secondary | ICD-10-CM | POA: Diagnosis not present

## 2017-07-05 DIAGNOSIS — E113591 Type 2 diabetes mellitus with proliferative diabetic retinopathy without macular edema, right eye: Secondary | ICD-10-CM | POA: Diagnosis not present

## 2017-07-05 DIAGNOSIS — E113592 Type 2 diabetes mellitus with proliferative diabetic retinopathy without macular edema, left eye: Secondary | ICD-10-CM | POA: Diagnosis not present

## 2017-07-11 DIAGNOSIS — E113591 Type 2 diabetes mellitus with proliferative diabetic retinopathy without macular edema, right eye: Secondary | ICD-10-CM | POA: Diagnosis not present

## 2017-07-11 DIAGNOSIS — H4311 Vitreous hemorrhage, right eye: Secondary | ICD-10-CM | POA: Diagnosis not present

## 2017-07-22 DIAGNOSIS — M79672 Pain in left foot: Secondary | ICD-10-CM | POA: Diagnosis not present

## 2017-07-22 DIAGNOSIS — E139 Other specified diabetes mellitus without complications: Secondary | ICD-10-CM | POA: Diagnosis not present

## 2017-07-22 DIAGNOSIS — L0291 Cutaneous abscess, unspecified: Secondary | ICD-10-CM | POA: Diagnosis not present

## 2017-07-25 ENCOUNTER — Ambulatory Visit: Payer: Self-pay | Admitting: Orthopaedic Surgery

## 2017-08-01 ENCOUNTER — Ambulatory Visit: Payer: 59 | Admitting: Orthopaedic Surgery

## 2017-08-01 ENCOUNTER — Encounter: Payer: Self-pay | Admitting: Orthopaedic Surgery

## 2017-08-01 VITALS — BP 126/80 | HR 78 | Temp 98.1°F | Ht 67.0 in | Wt 245.0 lb

## 2017-08-01 DIAGNOSIS — G8929 Other chronic pain: Secondary | ICD-10-CM

## 2017-08-01 DIAGNOSIS — M25561 Pain in right knee: Secondary | ICD-10-CM

## 2017-08-01 DIAGNOSIS — F17210 Nicotine dependence, cigarettes, uncomplicated: Secondary | ICD-10-CM

## 2017-08-01 MED ORDER — HYDROCODONE-ACETAMINOPHEN 5-325 MG PO TABS: 1.0000 | ORAL_TABLET | Freq: Four times a day (QID) | ORAL | 0 refills | Status: DC | PRN

## 2017-08-01 NOTE — Progress Notes (Signed)
CC:  I have pain of my right knee. I would like an injection.  The patient has chronic pain of the right knee.  There is no recent trauma.  There is no redness.  Injections in the past have helped.  The knee has no redness, has an effusion and crepitus present.  ROM of the right knee is 0-105.  Impression:  Chronic knee pain right  Return: 1 month  PROCEDURE NOTE:  The patient requests injections of the right knee , verbal consent was obtained.  The right knee was prepped appropriately after time out was performed.   Sterile technique was observed and injection of 1 cc of Depo-Medrol 40 mg with several cc's of plain xylocaine. Anesthesia was provided by ethyl chloride and a 20-gauge needle was used to inject the knee area. The injection was tolerated well.  A band aid dressing was applied.  The patient was advised to apply ice later today and tomorrow to the injection sight as needed.  I have reviewed the [place] [facility] web site prior to prescribing narcotic medicine for this patient.  Electronically Signed Sanjuana Kava, MD 2/19/20199:25 AM

## 2017-08-07 DIAGNOSIS — S90822A Blister (nonthermal), left foot, initial encounter: Secondary | ICD-10-CM | POA: Diagnosis not present

## 2017-08-07 DIAGNOSIS — S92355A Nondisplaced fracture of fifth metatarsal bone, left foot, initial encounter for closed fracture: Secondary | ICD-10-CM | POA: Diagnosis not present

## 2017-08-07 DIAGNOSIS — E1140 Type 2 diabetes mellitus with diabetic neuropathy, unspecified: Secondary | ICD-10-CM | POA: Diagnosis not present

## 2017-08-07 DIAGNOSIS — M79672 Pain in left foot: Secondary | ICD-10-CM | POA: Diagnosis not present

## 2017-08-07 LAB — HM DIABETES EYE EXAM

## 2017-08-14 DIAGNOSIS — E1140 Type 2 diabetes mellitus with diabetic neuropathy, unspecified: Secondary | ICD-10-CM | POA: Diagnosis not present

## 2017-08-14 DIAGNOSIS — M79672 Pain in left foot: Secondary | ICD-10-CM | POA: Diagnosis not present

## 2017-08-14 DIAGNOSIS — Q667 Congenital pes cavus: Secondary | ICD-10-CM | POA: Diagnosis not present

## 2017-08-25 ENCOUNTER — Ambulatory Visit: Payer: 59 | Admitting: Orthopedic Surgery

## 2017-08-25 VITALS — BP 157/96 | HR 85 | Ht 67.0 in | Wt 245.0 lb

## 2017-08-25 DIAGNOSIS — G8929 Other chronic pain: Secondary | ICD-10-CM

## 2017-08-25 DIAGNOSIS — M25561 Pain in right knee: Secondary | ICD-10-CM

## 2017-08-25 DIAGNOSIS — F17210 Nicotine dependence, cigarettes, uncomplicated: Secondary | ICD-10-CM

## 2017-08-25 NOTE — Patient Instructions (Addendum)
You have received an injection of steroids into the joint. 15% of patients will have increased pain within the 24 hours postinjection.   This is transient and will go away.   We recommend that you use ice packs on the injection site for 20 minutes every 2 hours and extra strength Tylenol 2 tablets every 8 as needed until the pain resolves.  If you continue to have pain after taking the Tylenol and using the ice please call the office for further instructions.    Total Knee Replacement Total knee replacement is a surgery to replace your knee joint with a man-made (prosthetic) joint. The man-made joint is called a prosthesis. It replaces parts of the thigh bone (femur), lower leg bone (tibia), and kneecap (patella). This surgery is done to lessen pain and improve knee movement. What happens before the procedure?  Ask your doctor about: ? Changing or stopping your normal medicines. This is especially important if you take diabetes medicines or blood thinners. ? Taking medicines such as aspirin and ibuprofen. These medicines can thin your blood. Do not take these medicines before your procedure if your doctor tells you not to.  Get all dental care that you need done before your procedure. Plan to not have dental work done for 3 months after your surgery.  Follow instructions from your doctor about what you cannot eat or drink.  Ask your doctor how your surgical site will be marked or identified.  You may be given antibiotic medicine to help prevent infection.  If your doctor prescribes physical therapy, do exercises as told.  Do not use any tobacco products, such as cigarettes, chewing tobacco, or e-cigarettes. If you need help quitting, ask your doctor.  You may have a physical exam.  You may have tests, such as: ? X-rays. ? MRI. ? CT scan. ? Bone scans.  You may have a blood or urine sample taken.  Plan to have someone take you home after the procedure.  If you will be going  home right after the procedure, plan to have someone with you for at least 24 hours. It is best to have someone help care for you for at least 4-6 weeks after surgery. What happens during the procedure?  To reduce your risk of infection: ? Your health care team will wash or sanitize their hands. ? Your skin will be washed with soap.  An IV tube will be put into one of your veins.  You will be given one or more of the following: ? Sedative. This is a medicine that makes you relaxed. ? Local anesthetic. This is a medicine to numb the area. ? General anesthetic. This is a medicine that makes you fall asleep. ? Spinal anesthetic. This is a medicine that numbs your body below the waist. ? Regional anesthetic. This is a medicine that numbs everything below the injection site.  A cut (incision) will be made in your knee.  Damaged parts of your thigh bone, lower leg bone, and kneecap will be removed.  A piece of metal (liner) will be placed on your thigh bone. Pieces of plastic will be placed on your lower leg bone and the underside of your kneecap.  One or more small tubes (drains) may be placed near your cut to help drain fluid.  Your cut will be closed with stitches (sutures), skin glue, or skin tape (adhesive) strips. Medicine may be put on your cut.  A bandage (dressing) will be placed over your cut. The  procedure may vary among doctors and hospitals. What happens after the procedure?  Your blood pressure, heart rate, breathing rate, and blood oxygen level will be monitored often until the medicines you were given have worn off.  You may continue to get fluids and medicines through an IV tube.  You will have some pain. There will be medicines to help you.  You may have fluid coming from a drain.  You may have to wear special socks (compression stockings). These help to prevent blood clots and reduce swelling in your legs.  You will be told to move around as much as  possible.  You may be given a continuous passive motion machine to use at home. You will be shown how to use this machine.  Do not drive for 24 hours if you received a sedative. This information is not intended to replace advice given to you by your health care provider. Make sure you discuss any questions you have with your health care provider. Document Released: 08/22/2011 Document Revised: 02/01/2016 Document Reviewed: 05/06/2015 Elsevier Interactive Patient Education  2017 [employer].

## 2017-08-25 NOTE — Progress Notes (Signed)
Chief Complaint  Patient presents with  . Follow-up    4 week recheck on right knee.    Procedure note right knee injection verbal consent was obtained to inject right knee joint  Timeout was completed to confirm the site of injection  The medications used were 40 mg of Depo-Medrol and 1% lidocaine 3 cc  Anesthesia was provided by ethyl chloride and the skin was prepped with alcohol.  After cleaning the skin with alcohol a 20-gauge needle was used to inject the right knee joint. There were no complications. A sterile bandage was applied.   Encounter Diagnoses  Name Primary?  . Chronic pain of right knee Yes  . Cigarette nicotine dependence without complication    FU 6 WEEKS

## 2017-08-27 ENCOUNTER — Other Ambulatory Visit: Payer: Self-pay | Admitting: Family Medicine

## 2017-08-27 ENCOUNTER — Other Ambulatory Visit: Payer: Self-pay | Admitting: Cardiovascular Disease

## 2017-08-28 NOTE — Telephone Encounter (Signed)
Please schedule f/u and refill until then  

## 2017-08-28 NOTE — Telephone Encounter (Signed)
Pt hasn't been seen in over a year and no future appts., please advise  

## 2017-08-29 NOTE — Telephone Encounter (Signed)
Left VM requesting pt to call the office back CRM created

## 2017-08-31 NOTE — Telephone Encounter (Signed)
Left VM to call back, schedule appt.

## 2017-08-31 NOTE — Telephone Encounter (Signed)
Patient called, left detailed VM that a follow up appointment will need to be scheduled before the Wellbutrin and Cymbalta can be filled per Dr. Glori Bickers; call office to schedule.

## 2017-09-18 ENCOUNTER — Encounter: Payer: Self-pay | Admitting: Family Medicine

## 2017-09-18 ENCOUNTER — Ambulatory Visit: Payer: 59 | Admitting: Family Medicine

## 2017-09-18 VITALS — BP 140/70 | HR 81 | Temp 98.8°F | Ht 67.0 in | Wt 244.5 lb

## 2017-09-18 DIAGNOSIS — E669 Obesity, unspecified: Secondary | ICD-10-CM | POA: Diagnosis not present

## 2017-09-18 DIAGNOSIS — E11319 Type 2 diabetes mellitus with unspecified diabetic retinopathy without macular edema: Secondary | ICD-10-CM

## 2017-09-18 DIAGNOSIS — F17200 Nicotine dependence, unspecified, uncomplicated: Secondary | ICD-10-CM | POA: Diagnosis not present

## 2017-09-18 DIAGNOSIS — Z794 Long term (current) use of insulin: Secondary | ICD-10-CM

## 2017-09-18 DIAGNOSIS — J449 Chronic obstructive pulmonary disease, unspecified: Secondary | ICD-10-CM | POA: Diagnosis not present

## 2017-09-18 DIAGNOSIS — F4323 Adjustment disorder with mixed anxiety and depressed mood: Secondary | ICD-10-CM

## 2017-09-18 DIAGNOSIS — I10 Essential (primary) hypertension: Secondary | ICD-10-CM | POA: Diagnosis not present

## 2017-09-18 DIAGNOSIS — Z1231 Encounter for screening mammogram for malignant neoplasm of breast: Secondary | ICD-10-CM

## 2017-09-18 MED ORDER — DULOXETINE HCL 60 MG PO CPEP: 60.0000 mg | ORAL_CAPSULE | Freq: Every day | ORAL | 3 refills | Status: DC

## 2017-09-18 MED ORDER — BUPROPION HCL ER (SR) 150 MG PO TB12: 150.0000 mg | ORAL_TABLET | Freq: Two times a day (BID) | ORAL | 3 refills | Status: DC

## 2017-09-18 NOTE — Patient Instructions (Addendum)
Glad diabetes is doing better   Keep working on quitting smoking !  BP was 140/70 on 2nd check  Follow up with cardiology for that   Start back on your wellbutrin and cymbalta  Let me know if you do not feel better   We will refer you for a mammogram

## 2017-09-18 NOTE — Assessment & Plan Note (Signed)
bp in fair control at this time  BP Readings from Last 1 Encounters:  09/18/17 140/70  improved on 2nd check Continue cardiology f/u  No changes needed Disc lifstyle change with low sodium diet and exercise

## 2017-09-18 NOTE — Assessment & Plan Note (Signed)
Discussed how this problem influences overall health and the risks it imposes  Reviewed plan for weight loss with lower calorie diet (via better food choices and also portion control or program like weight watchers) and exercise building up to or more than 30 minutes 5 days per week including some aerobic activity    

## 2017-09-18 NOTE — Assessment & Plan Note (Signed)
Ref for annual screening mammogram  Enc self exams monthly

## 2017-09-18 NOTE — Assessment & Plan Note (Signed)
Continues f/u with dr Chalmers Cater Last A1C was 7.0- this is improved with an insulin pump

## 2017-09-18 NOTE — Assessment & Plan Note (Signed)
Enc to quit smoking  Stable/no c/o from pt

## 2017-09-18 NOTE — Progress Notes (Signed)
Subjective:    Patient ID: Jennifer [name], female    DOB: Feb 19, 1956, 62 y.o.   MRN: [id number]  HPI  Here for f/u of chronic medical problems   Doing ok- stable overall  Not getting out much working 3rd shift   (was laid off and called back)  It wears her out  Plans on retiring in 2020   Going to ortho for knee problems   Wt Readings from Last 3 Encounters:  09/18/17 244 lb 8 oz (110.9 kg)  08/25/17 245 lb (111.1 kg)  08/01/17 245 lb (111.1 kg)   38.29 kg/m   Smoking status --working on quitting! - 3 cig is the least she has had   COPD -the same  Is hard when she works with [employer] in [place] wellbutrin SR 150 bid  cymbalta 60 mg day Did well = until she ran out  Needs a refill (coming off of cymbalta is rough)  Stress stays high - but she deals with it  She has no time for self care   Sees cardiology for CAD and HTN   (visit 8/2 with 6 mo f/u)-has appt this month  Last visit her lisinopril was inc to 20 mg daily  bp is stable today  No cp or palpitations or headaches or edema  No side effects to medicines  BP Readings from Last 3 Encounters:  09/18/17 (!) 146/78  08/25/17 (!) 157/96  08/01/17 126/80   BP: 140/70  Improved on 2nd check today  Has had some eye surgeries with Dr Zadie Rhine   Wants to get set up for a mammogram  No lumps on self exam       Sees dr Chalmers Cater for her DM2 Now has insulin pump- likes it and last A1C was 7.0  Also hypothyroid -no changes   On lipitor for her cholesterol Lab Results  Component Value Date   CHOL 154 11/04/2015   HDL 37 (L) 11/04/2015   LDLCALC 79 11/04/2015   LDLDIRECT 154.2 11/04/2011   TRIG 190 (H) 11/04/2015   CHOLHDL 4.2 11/04/2015    Patient Active Problem List   Diagnosis Date Noted  . Screening mammogram, encounter for 09/18/2017  . CAD (coronary artery disease) 11/04/2015  . NSTEMI (non-ST elevated myocardial infarction) ([facility]) 11/03/2015  . Hypothyroidism 07/06/2010  .  Hyperlipidemia LDL goal <100 08/28/2008  . Essential hypertension 11/13/2007  . Diabetes type 2, controlled ([facility]) 09/21/2006  . DIABETIC PERIPHERAL NEUROPATHY 09/21/2006  . Obesity (BMI 30-39.9) 09/21/2006  . TOBACCO ABUSE 09/21/2006  . Adjustment disorder with mixed anxiety and depressed mood 09/21/2006  . CARPAL TUNNEL SYNDROME 09/21/2006  . COPD (chronic obstructive pulmonary disease) ([facility]) 09/21/2006  . EDEMA 09/21/2006  . MIGRAINES, HX OF 09/21/2006   Past Medical History:  Diagnosis Date  . CAD (coronary artery disease)    cath 5/23 100% dist RCA lesion treated with 2 overlapping Integrity Resolute DES ( 2.25x62mm, 2.25x88mm), 60% mid RCA treated medically, 99% OM2 not amenable to PCI, 70% D1 lesion, EF normal  . Diabetes mellitus without complication ([facility])   . Hypercholesteremia   . Hypertension   . Thyroid disease    Past Surgical History:  Procedure Laterality Date  . ABDOMINAL HYSTERECTOMY    . CARDIAC CATHETERIZATION N/A 11/03/2015   Procedure: Left Heart Cath and Coronary Angiography;  Surgeon: Leonie Man, MD;  Location: [facility];  Service: Cardiovascular;  Laterality: N/A;  . CARDIAC CATHETERIZATION N/A 11/03/2015  Procedure: Coronary Stent Intervention;  Surgeon: Leonie Man, MD;  Location: [facility];  Service: Cardiovascular;  Laterality: N/A;  . CARPAL TUNNEL RELEASE     Social History   Tobacco Use  . Smoking status: Current Some Day Smoker    Packs/day: 0.25    Types: Cigarettes  . Smokeless tobacco: Never Used  Substance Use Topics  . Alcohol use: No    Alcohol/week: 0.0 oz  . Drug use: No   Family History  Problem Relation Age of Onset  . Heart disease Mother   . Stroke Sister   . Heart attack Brother    No Known Allergies Current Outpatient Medications on File Prior to Visit  Medication Sig Dispense Refill  . aspirin EC 81 MG EC tablet Take 1 tablet (81 mg total) by mouth daily.    Marland Kitchen atorvastatin (LIPITOR) 80 MG  tablet Take 1 tablet (80 mg total) by mouth daily at 6 PM. 90 tablet 1  . carvedilol (COREG) 12.5 MG tablet TAKE 1 TABLET BY MOUTH TWICE DAILY WITH A MEAL 180 tablet 1  . diclofenac (VOLTAREN) 75 MG EC tablet TAKE ONE TABLET BY MOUTH TWICE DAILY WITH A MEAL 60 tablet 5  . Diclofenac Sodium 3 % GEL Use to knees, 4gm per dose, tid. (Patient taking differently: Apply 4 g topically 4 (four) times daily as needed (for pain). ) 5 Tube 5  . furosemide (LASIX) 40 MG tablet Take 1 tablet (40 mg total) by mouth daily. 90 tablet 3  . HYDROcodone-acetaminophen (NORCO/VICODIN) 5-325 MG tablet Take 1 tablet by mouth every 6 (six) hours as needed for moderate pain (Must last 14 days). 50 tablet 0  . insulin lispro (HUMALOG) 100 UNIT/ML injection Inject 40 Units into the skin daily as needed for high blood sugar.     . levothyroxine (SYNTHROID, LEVOTHROID) 112 MCG tablet Take 112 mcg by mouth every evening.     Marland Kitchen lisinopril (PRINIVIL,ZESTRIL) 20 MG tablet TAKE 1 TABLET BY MOUTH ONCE DAILY 15 tablet 0  . nitroGLYCERIN (NITROSTAT) 0.4 MG SL tablet Place 1 tablet (0.4 mg total) under the tongue every 5 (five) minutes x 3 doses as needed for chest pain. 25 tablet 3  . sitaGLIPtin (JANUVIA) 100 MG tablet Take 100 mg by mouth every evening.     . ticagrelor (BRILINTA) 60 MG TABS tablet Take 1 tablet (60 mg total) by mouth 2 (two) times daily. 180 tablet 3  . tiZANidine (ZANAFLEX) 4 MG tablet One by mouth every 8 hours as needed for spasm 90 tablet 3   No current facility-administered medications on file prior to visit.      Review of Systems  Constitutional: Positive for fatigue. Negative for activity change, appetite change, fever and unexpected weight change.  HENT: Negative for congestion, ear pain, rhinorrhea, sinus pressure and sore throat.   Eyes: Negative for pain, redness and visual disturbance.  Respiratory: Negative for cough, shortness of breath and wheezing.   Cardiovascular: Negative for chest pain and  palpitations.  Gastrointestinal: Negative for abdominal pain, blood in stool, constipation and diarrhea.  Endocrine: Negative for polydipsia and polyuria.  Genitourinary: Negative for dysuria, frequency and urgency.  Musculoskeletal: Negative for arthralgias, back pain and myalgias.  Skin: Negative for pallor and rash.  Allergic/Immunologic: Negative for environmental allergies.  Neurological: Negative for dizziness, syncope and headaches.  Hematological: Negative for adenopathy. Does not bruise/bleed easily.  Psychiatric/Behavioral: Positive for dysphoric mood, self-injury and suicidal ideas. Negative for decreased concentration. The patient  is nervous/anxious.        Objective:   Physical Exam  Constitutional: She appears well-developed and well-nourished. No distress.  obese and well appearing   HENT:  Head: Normocephalic and atraumatic.  Mouth/Throat: Oropharynx is clear and moist.  Eyes: Pupils are equal, round, and reactive to light. Conjunctivae and EOM are normal.  Neck: Normal range of motion. Neck supple. No JVD present. Carotid bruit is not present. No thyromegaly present.  Cardiovascular: Normal rate, regular rhythm, normal heart sounds and intact distal pulses. Exam reveals no gallop.  Pulmonary/Chest: Effort normal and breath sounds normal. No respiratory distress. She has no wheezes. She has no rales.  No crackles  Diffusely distant bs   Abdominal: Soft. Bowel sounds are normal. She exhibits no distension, no abdominal bruit and no mass. There is no tenderness.  Musculoskeletal: She exhibits no edema.  Lymphadenopathy:    She has no cervical adenopathy.  Neurological: She is alert. She has normal reflexes. No cranial nerve deficit. She exhibits normal muscle tone. Coordination normal.  Skin: Skin is warm and dry. No rash noted. No pallor.  Large non ulcerated callous L lateral foot  No drainage or erythema   Psychiatric: Her speech is normal and behavior is normal.  Thought content normal. Her mood appears not anxious. Her affect is blunt. Cognition and memory are normal. She exhibits a depressed mood.  Seems fatigued and somewhat down today          Assessment & Plan:   Problem List Items Addressed This Visit      Cardiovascular and Mediastinum   Essential hypertension - Primary (Chronic)    bp in fair control at this time  BP Readings from Last 1 Encounters:  09/18/17 140/70  improved on 2nd check Continue cardiology f/u  No changes needed Disc lifstyle change with low sodium diet and exercise          Respiratory   COPD (chronic obstructive pulmonary disease) ([facility]) (Chronic)    Enc to quit smoking  Stable/no c/o from pt         Endocrine   Diabetes type 2, controlled ([facility]) (Chronic)    Continues f/u with dr Chalmers Cater Last A1C was 7.0- this is improved with an insulin pump         Other   Adjustment disorder with mixed anxiety and depressed mood    Missed over a week of wellbutrin and cymbalta when she ran out- with marked worsening of mood  Is unable to function well w/o them Reviewed stressors/ coping techniques/symptoms/ support sources/ tx options and side effects in detail today  Refills for both medications for 1 y  Disc imp of self care and exercise  Meds ordered this encounter  Medications  . buPROPion (WELLBUTRIN SR) 150 MG 12 hr tablet    Sig: Take 1 tablet (150 mg total) by mouth 2 (two) times daily.    Dispense:  180 tablet    Refill:  3  . DULoxetine (CYMBALTA) 60 MG capsule    Sig: Take 1 capsule (60 mg total) by mouth daily.    Dispense:  90 capsule    Refill:  3         Obesity (BMI 30-39.9)    Discussed how this problem influences overall health and the risks it imposes  Reviewed plan for weight loss with lower calorie diet (via better food choices and also portion control or program like weight watchers) and exercise building up to or more than  30 minutes 5 days per week including some aerobic  activity         Screening mammogram, encounter for    Ref for annual screening mammogram  Enc self exams monthly      Relevant Orders   MM DIGITAL SCREENING BILATERAL   TOBACCO ABUSE (Chronic)    Discussed how this problem influences overall health and the risks it imposes  Reviewed plan for weight loss with lower calorie diet (via better food choices and also portion control or program like weight watchers) and exercise building up to or more than 30 minutes 5 days per week including some aerobic activity

## 2017-09-18 NOTE — Assessment & Plan Note (Signed)
Missed over a week of wellbutrin and cymbalta when she ran out- with marked worsening of mood  Is unable to function well w/o them Reviewed stressors/ coping techniques/symptoms/ support sources/ tx options and side effects in detail today  Refills for both medications for 1 y  Disc imp of self care and exercise  Meds ordered this encounter  Medications  . buPROPion (WELLBUTRIN SR) 150 MG 12 hr tablet    Sig: Take 1 tablet (150 mg total) by mouth 2 (two) times daily.    Dispense:  180 tablet    Refill:  3  . DULoxetine (CYMBALTA) 60 MG capsule    Sig: Take 1 capsule (60 mg total) by mouth daily.    Dispense:  90 capsule    Refill:  3

## 2017-09-21 ENCOUNTER — Other Ambulatory Visit: Payer: Self-pay | Admitting: Orthopaedic Surgery

## 2017-09-21 MED ORDER — HYDROCODONE-ACETAMINOPHEN 5-325 MG PO TABS: 1.0000 | ORAL_TABLET | Freq: Four times a day (QID) | ORAL | 0 refills | Status: DC | PRN

## 2017-09-21 NOTE — Telephone Encounter (Signed)
Patient of Dr. Brooke Bonito requests refill on Hydrocodone/Acetaminophen 5-325  Mgs.  Qty  50  Sig: Take 1 tablet by mouth every 6 (six) hours as needed for moderate pain (Must last 14 days).  Patient states she uses [employer]process [employer] [facility] in [place]

## 2017-09-22 DIAGNOSIS — E1165 Type 2 diabetes mellitus with hyperglycemia: Secondary | ICD-10-CM | POA: Diagnosis not present

## 2017-09-27 ENCOUNTER — Encounter: Payer: Self-pay | Admitting: Orthopedic Surgery

## 2017-09-27 ENCOUNTER — Ambulatory Visit: Payer: 59 | Admitting: Orthopedic Surgery

## 2017-09-27 VITALS — BP 149/101 | HR 76 | Ht 67.0 in | Wt 241.0 lb

## 2017-09-27 DIAGNOSIS — M25561 Pain in right knee: Secondary | ICD-10-CM | POA: Diagnosis not present

## 2017-09-27 DIAGNOSIS — G8929 Other chronic pain: Secondary | ICD-10-CM

## 2017-09-27 DIAGNOSIS — M1711 Unilateral primary osteoarthritis, right knee: Secondary | ICD-10-CM

## 2017-09-27 NOTE — Patient Instructions (Signed)
Fu dr Luna Glasgow for injection right knee

## 2017-09-27 NOTE — Progress Notes (Signed)
Chief Complaint  Patient presents with  . Knee Pain    right knee / wants injection    Ortho Exam   Procedure note right knee injection verbal consent was obtained to inject right knee joint  Timeout was completed to confirm the site of injection  The medications used were 40 mg of Depo-Medrol and 1% lidocaine 3 cc  Anesthesia was provided by ethyl chloride and the skin was prepped with alcohol.  After cleaning the skin with alcohol a 20-gauge needle was used to inject the right knee joint. There were no complications. A sterile bandage was applied.  Encounter Diagnoses  Name Primary?  . Chronic pain of right knee Yes  . Primary osteoarthritis of right knee    Jennifer [name] is also requested further treatment options including brace and we will have our brace person measure her up for medial OA knee brace  Return in 6 weeks  for injection with Dr. Luna Glasgow

## 2017-09-29 ENCOUNTER — Ambulatory Visit (HOSPITAL_COMMUNITY): Payer: Self-pay

## 2017-10-03 DIAGNOSIS — E7800 Pure hypercholesterolemia, unspecified: Secondary | ICD-10-CM | POA: Diagnosis not present

## 2017-10-03 DIAGNOSIS — E039 Hypothyroidism, unspecified: Secondary | ICD-10-CM | POA: Diagnosis not present

## 2017-10-03 DIAGNOSIS — E118 Type 2 diabetes mellitus with unspecified complications: Secondary | ICD-10-CM | POA: Diagnosis not present

## 2017-10-03 DIAGNOSIS — E1165 Type 2 diabetes mellitus with hyperglycemia: Secondary | ICD-10-CM | POA: Diagnosis not present

## 2017-10-04 ENCOUNTER — Ambulatory Visit (HOSPITAL_COMMUNITY)
Admission: RE | Admit: 2017-10-04 | Discharge: 2017-10-04 | Disposition: A | Discharge status: Home / Self Care | Payer: 59 | Source: Ambulatory Visit | Attending: Family Medicine | Admitting: Family Medicine

## 2017-10-04 ENCOUNTER — Encounter (HOSPITAL_COMMUNITY): Payer: Self-pay

## 2017-10-04 DIAGNOSIS — Z1231 Encounter for screening mammogram for malignant neoplasm of breast: Secondary | ICD-10-CM | POA: Diagnosis not present

## 2017-10-10 DIAGNOSIS — E1165 Type 2 diabetes mellitus with hyperglycemia: Secondary | ICD-10-CM | POA: Diagnosis not present

## 2017-10-10 DIAGNOSIS — E039 Hypothyroidism, unspecified: Secondary | ICD-10-CM | POA: Diagnosis not present

## 2017-10-10 DIAGNOSIS — I10 Essential (primary) hypertension: Secondary | ICD-10-CM | POA: Diagnosis not present

## 2017-10-17 ENCOUNTER — Other Ambulatory Visit: Payer: Self-pay | Admitting: Orthopaedic Surgery

## 2017-10-17 NOTE — Telephone Encounter (Signed)
Hydrocodone-Acetaminophen  5/325 mg  Qty 50 Tablets  Take 1 tablet by mouth every 6 (six) hours as needed for moderate pain (Must last 14 days).  PATIENT USES [facility]

## 2017-10-18 MED ORDER — HYDROCODONE-ACETAMINOPHEN 5-325 MG PO TABS: 1.0000 | ORAL_TABLET | Freq: Four times a day (QID) | ORAL | 0 refills | Status: DC | PRN

## 2017-10-26 ENCOUNTER — Ambulatory Visit (INDEPENDENT_AMBULATORY_CARE_PROVIDER_SITE_OTHER): Payer: 59 | Admitting: Family Medicine

## 2017-10-26 ENCOUNTER — Other Ambulatory Visit: Payer: Self-pay | Admitting: Orthopaedic Surgery

## 2017-10-26 ENCOUNTER — Encounter: Payer: Self-pay | Admitting: Family Medicine

## 2017-10-26 ENCOUNTER — Other Ambulatory Visit: Payer: Self-pay | Admitting: Cardiovascular Disease

## 2017-10-26 VITALS — BP 156/84 | HR 71 | Temp 98.1°F | Ht 67.0 in | Wt 238.8 lb

## 2017-10-26 DIAGNOSIS — M545 Low back pain, unspecified: Secondary | ICD-10-CM | POA: Insufficient documentation

## 2017-10-26 LAB — POC URINALSYSI DIPSTICK (AUTOMATED)
Bilirubin, UA: NEGATIVE
Blood, UA: 50
Glucose, UA: NEGATIVE
Ketones, UA: 5
LEUKOCYTES UA: NEGATIVE
NITRITE UA: NEGATIVE
PH UA: 6 (ref 5.0–8.0)
Spec Grav, UA: 1.025 (ref 1.010–1.025)
UROBILINOGEN UA: 0.2 U/dL

## 2017-10-26 MED ORDER — TIZANIDINE HCL 4 MG PO TABS: ORAL_TABLET | ORAL | 1 refills | Status: DC

## 2017-10-26 MED ORDER — MELOXICAM 15 MG PO TABS: 15.0000 mg | ORAL_TABLET | Freq: Every day | ORAL | 1 refills | Status: DC | PRN

## 2017-10-26 NOTE — Progress Notes (Signed)
Subjective:    Patient ID: Jennifer [name], female    DOB: 12-04-1955, 62 y.o.   MRN: [id number]  HPI Here for R lower back pain -for a week  Wt Readings from Last 3 Encounters:  10/26/17 238 lb 12 oz (108.3 kg)  09/27/17 241 lb (109.3 kg)  09/18/17 244 lb 8 oz (110.9 kg)  37.39 kg/m   Ketones, little blood and protein in urine  Has diabetes  This am blood sugar 120  A1C is 7   Results for orders placed or performed in visit on 10/26/17  POCT Urinalysis Dipstick (Automated)  Result Value Ref Range   Color, UA Yellow    Clarity, UA Clear    Glucose, UA Negative    Bilirubin, UA Negative    Ketones, UA 5 mg/dL    Spec Grav, UA 1.025 1.010 - 1.025   Blood, UA 50 Ery/uL    pH, UA 6.0 5.0 - 8.0   Protein, UA 300 mg/dL    Urobilinogen, UA 0.2 0.2 or 1.0 E.U./dL   Nitrite, UA Negative    Leukocytes, UA Negative Negative      Pain R lower back  Worse to twist or "walk wrong"  She does pull and lift at work  Unsure if going to her leg (has knee OA and gets cortisone shots)-affects her gait  Has not had this back pain before   Taking some advil  Shower helps (heat)  Patient Active Problem List   Diagnosis Date Noted  . Low back pain 10/26/2017  . Screening mammogram, encounter for 09/18/2017  . CAD (coronary artery disease) 11/04/2015  . NSTEMI (non-ST elevated myocardial infarction) ([place]) 11/03/2015  . Hypothyroidism 07/06/2010  . Hyperlipidemia LDL goal <100 08/28/2008  . Essential hypertension 11/13/2007  . Diabetes type 2, controlled ([place]) 09/21/2006  . DIABETIC PERIPHERAL NEUROPATHY 09/21/2006  . Obesity (BMI 30-39.9) 09/21/2006  . TOBACCO ABUSE 09/21/2006  . Adjustment disorder with mixed anxiety and depressed mood 09/21/2006  . CARPAL TUNNEL SYNDROME 09/21/2006  . COPD (chronic obstructive pulmonary disease) ([place]) 09/21/2006  . EDEMA 09/21/2006  . MIGRAINES, HX OF 09/21/2006   Past Medical History:  Diagnosis Date  . CAD (coronary artery disease)    cath 5/23 100% dist RCA lesion treated with 2 overlapping Integrity Resolute DES ( 2.25x64mm, 2.25x82mm), 60% mid RCA treated medically, 99% OM2 not amenable to PCI, 70% D1 lesion, EF normal  . Diabetes mellitus without complication ([place])   . Hypercholesteremia   . Hypertension   . Thyroid disease    Past Surgical History:  Procedure Laterality Date  . ABDOMINAL HYSTERECTOMY    . CARDIAC CATHETERIZATION N/A 11/03/2015   Procedure: Left Heart Cath and Coronary Angiography;  Surgeon: Leonie Man, MD;  Location: [facility];  Service: Cardiovascular;  Laterality: N/A;  . CARDIAC CATHETERIZATION N/A 11/03/2015   Procedure: Coronary Stent Intervention;  Surgeon: Leonie Man, MD;  Location: [facility];  Service: Cardiovascular;  Laterality: N/A;  . CARPAL TUNNEL RELEASE     Social History   Tobacco Use  . Smoking status: Current Some Day Smoker    Packs/day: 0.25    Types: Cigarettes  . Smokeless tobacco: Never Used  Substance Use Topics  . Alcohol use: No    Alcohol/week: 0.0 oz  . Drug use: No   Family History  Problem Relation Age of Onset  . Heart disease Mother   . Stroke Sister   . Heart attack Brother  No Known Allergies Current Outpatient Medications on File Prior to Visit  Medication Sig Dispense Refill  . aspirin EC 81 MG EC tablet Take 1 tablet (81 mg total) by mouth daily.    Marland Kitchen buPROPion (WELLBUTRIN SR) 150 MG 12 hr tablet Take 1 tablet (150 mg total) by mouth 2 (two) times daily. 180 tablet 3  . Diclofenac Sodium 3 % GEL Use to knees, 4gm per dose, tid. (Patient taking differently: Apply 4 g topically 4 (four) times daily as needed (for pain). ) 5 Tube 5  . DULoxetine (CYMBALTA) 60 MG capsule Take 1 capsule (60 mg total) by mouth daily. 90 capsule 3  . furosemide (LASIX) 40 MG tablet Take 1 tablet (40 mg total) by mouth daily. 90 tablet 3  . HYDROcodone-acetaminophen (NORCO/VICODIN) 5-325 MG tablet Take 1 tablet by mouth every 6 (six) hours as  needed for moderate pain (Must last 14 days). 50 tablet 0  . insulin lispro (HUMALOG) 100 UNIT/ML injection Inject 40 Units into the skin daily as needed for high blood sugar.     . levothyroxine (SYNTHROID, LEVOTHROID) 112 MCG tablet Take 112 mcg by mouth every evening.     Marland Kitchen lisinopril (PRINIVIL,ZESTRIL) 20 MG tablet TAKE 1 TABLET BY MOUTH ONCE DAILY 15 tablet 0  . nitroGLYCERIN (NITROSTAT) 0.4 MG SL tablet Place 1 tablet (0.4 mg total) under the tongue every 5 (five) minutes x 3 doses as needed for chest pain. 25 tablet 3  . sitaGLIPtin (JANUVIA) 100 MG tablet Take 100 mg by mouth every evening.     . ticagrelor (BRILINTA) 60 MG TABS tablet Take 1 tablet (60 mg total) by mouth 2 (two) times daily. 180 tablet 3   No current facility-administered medications on file prior to visit.     Review of Systems  Constitutional: Negative for activity change, appetite change, fatigue, fever and unexpected weight change.  HENT: Negative for congestion, ear pain, rhinorrhea, sinus pressure and sore throat.   Eyes: Negative for pain, redness and visual disturbance.  Respiratory: Negative for cough, shortness of breath and wheezing.   Cardiovascular: Negative for chest pain and palpitations.  Gastrointestinal: Negative for abdominal pain, blood in stool, constipation and diarrhea.  Endocrine: Negative for polydipsia and polyuria.  Genitourinary: Negative for dysuria, frequency and urgency.  Musculoskeletal: Positive for back pain. Negative for arthralgias and myalgias.  Skin: Negative for pallor and rash.  Allergic/Immunologic: Negative for environmental allergies.  Neurological: Negative for dizziness, syncope and headaches.  Hematological: Negative for adenopathy. Does not bruise/bleed easily.  Psychiatric/Behavioral: Negative for decreased concentration and dysphoric mood. The patient is not nervous/anxious.        Objective:   Physical Exam  Constitutional: She appears well-developed and  well-nourished. No distress.  obese and well appearing   HENT:  Head: Normocephalic and atraumatic.  Eyes: Pupils are equal, round, and reactive to light. Conjunctivae and EOM are normal. No scleral icterus.  Neck: Normal range of motion. Neck supple.  Cardiovascular: Normal rate and regular rhythm.  Pulmonary/Chest: Effort normal and breath sounds normal. She has no wheezes. She has no rales.  Abdominal: Soft. Bowel sounds are normal. She exhibits no distension. There is no tenderness.  Musculoskeletal: She exhibits tenderness.       Right shoulder: She exhibits decreased range of motion, tenderness and spasm. She exhibits no bony tenderness, no swelling, no crepitus, no deformity, normal pulse and normal strength.       Lumbar back: She exhibits decreased range of motion, tenderness and  spasm. She exhibits no bony tenderness and no edema.  R lumbar muscular tenderness and spasm  No bony tenderness Most pain on L lat bend  Gait is labored No neuro changes   Lymphadenopathy:    She has no cervical adenopathy.  Neurological: She is alert. She has normal strength and normal reflexes. She displays no atrophy. No cranial nerve deficit or sensory deficit. She exhibits normal muscle tone. Coordination normal.  Negative SLR  Skin: Skin is warm and dry. No rash noted. No erythema. No pallor.  Psychiatric: She has a normal mood and affect.          Assessment & Plan:   Problem List Items Addressed This Visit      Other   Low back pain - Primary    R sided with spasm/positional  ua rev  Recommend heat/massage  Handout- rehab /stretches meloxicam Tizanidine  Watch for neuro s/s or urinary symptoms  Disc symptomatic care - see instructions on AVS  Update if not starting to improve in a week or if worsening   Consider PT/imaging if not imp      Relevant Medications   tiZANidine (ZANAFLEX) 4 MG tablet   meloxicam (MOBIC) 15 MG tablet   Other Relevant Orders   POCT Urinalysis  Dipstick (Automated) (Completed)

## 2017-10-26 NOTE — Assessment & Plan Note (Signed)
R sided with spasm/positional  ua rev  Recommend heat/massage  Handout- rehab /stretches meloxicam Tizanidine  Watch for neuro s/s or urinary symptoms  Disc symptomatic care - see instructions on AVS  Update if not starting to improve in a week or if worsening   Consider PT/imaging if not imp

## 2017-10-26 NOTE — Patient Instructions (Addendum)
meloxicam daily for back pain and inflammation  Tizanidine (muscle relaxer) as needed   Heat 10 minutes , cold 10 minutes and figure out which feels best   Update if not starting to improve in a week or if worsening (or new symptoms)   Slow walking/regular activity to avoid stiffness Off work today and tomorrow to avoid heavy lifting   See the handout on stretches

## 2017-11-02 ENCOUNTER — Ambulatory Visit (INDEPENDENT_AMBULATORY_CARE_PROVIDER_SITE_OTHER): Payer: 59 | Admitting: Orthopaedic Surgery

## 2017-11-02 ENCOUNTER — Encounter: Payer: Self-pay | Admitting: Orthopaedic Surgery

## 2017-11-02 VITALS — BP 199/98 | HR 78 | Ht 67.0 in | Wt 240.0 lb

## 2017-11-02 DIAGNOSIS — F17210 Nicotine dependence, cigarettes, uncomplicated: Secondary | ICD-10-CM

## 2017-11-02 DIAGNOSIS — M25561 Pain in right knee: Secondary | ICD-10-CM | POA: Diagnosis not present

## 2017-11-02 DIAGNOSIS — G8929 Other chronic pain: Secondary | ICD-10-CM

## 2017-11-02 MED ORDER — HYDROCODONE-ACETAMINOPHEN 5-325 MG PO TABS: 1.0000 | ORAL_TABLET | Freq: Four times a day (QID) | ORAL | 0 refills | Status: DC | PRN

## 2017-11-02 NOTE — Progress Notes (Signed)
Patient Jennifer [name], female DOB:April 19, 1956, 62 y.o. EUM:[id number]  Chief Complaint  Patient presents with  . Knee Pain    right     HPI  Jennifer [name] is a 62 y.o. female who has chronic pain of the right knee.  She had wanted a brace.  I have told her that I do not think a brace will benefit her.  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  I will give a handicap sticker form so she can park closer at work and outside of work.  She has swelling of the knee and no giving way.  She has crepitus.  She has no new injury.   HPI  Body mass index is 37.59 kg/m.  ROS  Review of Systems  Constitutional:       She is current smoker  HENT: Negative for congestion.   Respiratory: Positive for shortness of breath. Negative for cough.   Cardiovascular: Negative for chest pain and leg swelling.  Endocrine: Positive for cold intolerance.  Musculoskeletal: Positive for arthralgias, gait problem and joint swelling.  Allergic/Immunologic: Positive for environmental allergies.  Neurological: Positive for numbness and headaches.       Peripheral neruopathy  All other systems reviewed and are negative.   Past Medical History:  Diagnosis Date  . CAD (coronary artery disease)    cath 5/23 100% dist RCA lesion treated with 2 overlapping Integrity Resolute DES ( 2.25x47mm, 2.25x19mm), 60% mid RCA treated medically, 99% OM2 not amenable to PCI, 70% D1 lesion, EF normal  . Diabetes mellitus without complication ([place])   . Hypercholesteremia   . Hypertension   . Thyroid disease     Past Surgical History:  Procedure Laterality Date  . ABDOMINAL HYSTERECTOMY    . CARDIAC CATHETERIZATION N/A 11/03/2015   Procedure: Left Heart Cath and Coronary Angiography;  Surgeon: Leonie Man, MD;  Location: [facility];  Service: Cardiovascular;  Laterality: N/A;  . CARDIAC CATHETERIZATION N/A 11/03/2015   Procedure: Coronary Stent Intervention;  Surgeon: Leonie Man, MD;   Location: [facility];  Service: Cardiovascular;  Laterality: N/A;  . CARPAL TUNNEL RELEASE      Family History  Problem Relation Age of Onset  . Heart disease Mother   . Stroke Sister   . Heart attack Brother     Social History Social History   Tobacco Use  . Smoking status: Current Some Day Smoker    Packs/day: 0.25    Types: Cigarettes  . Smokeless tobacco: Never Used  Substance Use Topics  . Alcohol use: No    Alcohol/week: 0.0 oz  . Drug use: No    No Known Allergies  Current Outpatient Medications  Medication Sig Dispense Refill  . aspirin EC 81 MG EC tablet Take 1 tablet (81 mg total) by mouth daily.    Marland Kitchen atorvastatin (LIPITOR) 80 MG tablet TAKE 1 TABLET BY MOUTH ONCE DAILY AT 6 IN THE EVENING 30 tablet 0  . buPROPion (WELLBUTRIN SR) 150 MG 12 hr tablet Take 1 tablet (150 mg total) by mouth 2 (two) times daily. 180 tablet 3  . carvedilol (COREG) 12.5 MG tablet TAKE 1 TABLET BY MOUTH TWICE DAILY WITH MEALS 60 tablet 0  . diclofenac (VOLTAREN) 75 MG EC tablet TAKE 1 TABLET BY MOUTH TWICE DAILY WITH A MEAL 60 tablet 5  . Diclofenac Sodium 3 % GEL Use to knees, 4gm per dose, tid. (Patient taking differently: Apply 4 g topically 4 (  four) times daily as needed (for pain). ) 5 Tube 5  . DULoxetine (CYMBALTA) 60 MG capsule Take 1 capsule (60 mg total) by mouth daily. 90 capsule 3  . furosemide (LASIX) 40 MG tablet Take 1 tablet (40 mg total) by mouth daily. 90 tablet 3  . HYDROcodone-acetaminophen (NORCO/VICODIN) 5-325 MG tablet Take 1 tablet by mouth every 6 (six) hours as needed for moderate pain (Must last 14 days). 50 tablet 0  . insulin lispro (HUMALOG) 100 UNIT/ML injection Inject 40 Units into the skin daily as needed for high blood sugar.     . levothyroxine (SYNTHROID, LEVOTHROID) 112 MCG tablet Take 112 mcg by mouth every evening.     Marland Kitchen lisinopril (PRINIVIL,ZESTRIL) 20 MG tablet TAKE 1 TABLET BY MOUTH ONCE DAILY 15 tablet 0  . meloxicam (MOBIC) 15 MG tablet  Take 1 tablet (15 mg total) by mouth daily as needed for pain (back pain). With a meal 30 tablet 1  . nitroGLYCERIN (NITROSTAT) 0.4 MG SL tablet Place 1 tablet (0.4 mg total) under the tongue every 5 (five) minutes x 3 doses as needed for chest pain. 25 tablet 3  . sitaGLIPtin (JANUVIA) 100 MG tablet Take 100 mg by mouth every evening.     . ticagrelor (BRILINTA) 60 MG TABS tablet Take 1 tablet (60 mg total) by mouth 2 (two) times daily. 180 tablet 3  . tiZANidine (ZANAFLEX) 4 MG tablet One by mouth every 8 hours as needed for spasm 90 tablet 1   No current facility-administered medications for this visit.      Physical Exam  Blood pressure (!) 199/98, pulse 78, height 5\' 7"  (1.702 m), weight 240 lb (108.9 kg).  Constitutional: overall normal hygiene, normal nutrition, well developed, normal grooming, normal body habitus. Assistive device:none  Musculoskeletal: gait and station Limp right, muscle tone and strength are normal, no tremors or atrophy is present.  .  Neurological: coordination overall normal.  Deep tendon reflex/nerve stretch intact.  Sensation normal.  Cranial nerves II-XII intact.   Skin:   Normal overall no scars, lesions, ulcers or rashes. No psoriasis.  Psychiatric: Alert and oriented x 3.  Recent memory intact, remote memory unclear.  Normal mood and affect. Well groomed.  Good eye contact.  Cardiovascular: overall no swelling, no varicosities, no edema bilaterally, normal temperatures of the legs and arms, no clubbing, cyanosis and good capillary refill.  Lymphatic: palpation is normal.  The right lower extremity is examined:  Inspection:  Thigh:  Non-tender and no defects  Knee has swelling 1+ effusion.                        Joint tenderness is present                        Patient is tender over the medial joint line  Lower Leg:  Has normal appearance and no tenderness or defects  Ankle:  Non-tender and no defects  Foot:  Non-tender and no defects Range  of Motion:  Knee:  Range of motion is: 0-100                        Crepitus is  present  Ankle:  Range of motion is normal. Strength and Tone:  The right lower extremity has normal strength and tone. Stability:  Knee:  The knee is stable.  Ankle:  The ankle is stable. All other systems reviewed  and are negative   The patient has been educated about the nature of the problem(s) and counseled on treatment options.  The patient appeared to understand what I have discussed and is in agreement with it.  Encounter Diagnoses  Name Primary?  . Chronic pain of right knee Yes  . Cigarette nicotine dependence without complication     PLAN Call if any problems.  Precautions discussed.  Continue current medications.   Return to clinic 3 months   I have reviewed the [place] web site prior to prescribing narcotic medicine for this patient.  Electronically Signed Sanjuana Kava, MD 5/23/20199:33 AM

## 2017-11-08 ENCOUNTER — Ambulatory Visit: Payer: 59 | Admitting: Orthopaedic Surgery

## 2017-11-08 ENCOUNTER — Ambulatory Visit: Payer: Self-pay | Admitting: Family Medicine

## 2017-11-08 NOTE — Telephone Encounter (Signed)
I will see her then  

## 2017-11-08 NOTE — Telephone Encounter (Signed)
Pt having patchy rashes on both legs from the ankles to the knees. She denies pain or fever or itching. Feels warm to touch. Legs have been swelling (but usually do when working all night and standing on feet) so she takes a fluid pill for this.  No protocol found. Appointment made for tomorrow per pt request.  Will route to flow to [facility]. Home care advice given with verbal understanding.  Answer Assessment - Initial Assessment Questions 1. APPEARANCE of RASH: "Describe the rash."      Flat and red 2. LOCATION: "Where is the rash located?"      Both legs from the ankles to the knees 3. NUMBER: "How many spots are there?"      Patchy some big and small 4. SIZE: "How big are the spots?" (Inches, centimeters or compare to size of a coin)      n/a 5. ONSET: "When did the rash start?"      Yesterday morning 6. ITCHING: "Does the rash itch?" If so, ask: "How bad is the itch?"  (Scale 1-10; or mild, moderate, severe)     no 7. PAIN: "Does the rash hurt?" If so, ask: "How bad is the pain?"  (Scale 1-10; or mild, moderate, severe)     no 8. OTHER SYMPTOMS: "Do you have any other symptoms?" (e.g., fever)     no 9. PREGNANCY: "Is there any chance you are pregnant?" "When was your last menstrual period?"     no  Protocols used: RASH OR REDNESS - LOCALIZED-A-AH

## 2017-11-09 ENCOUNTER — Ambulatory Visit (INDEPENDENT_AMBULATORY_CARE_PROVIDER_SITE_OTHER): Payer: 59 | Admitting: Family Medicine

## 2017-11-09 ENCOUNTER — Encounter: Payer: Self-pay | Admitting: Family Medicine

## 2017-11-09 VITALS — BP 136/76 | HR 85 | Temp 97.9°F | Ht 67.0 in | Wt 242.2 lb

## 2017-11-09 DIAGNOSIS — R21 Rash and other nonspecific skin eruption: Secondary | ICD-10-CM | POA: Diagnosis not present

## 2017-11-09 DIAGNOSIS — I2510 Atherosclerotic heart disease of native coronary artery without angina pectoris: Secondary | ICD-10-CM

## 2017-11-09 DIAGNOSIS — I10 Essential (primary) hypertension: Secondary | ICD-10-CM

## 2017-11-09 DIAGNOSIS — F17200 Nicotine dependence, unspecified, uncomplicated: Secondary | ICD-10-CM

## 2017-11-09 DIAGNOSIS — I2583 Coronary atherosclerosis due to lipid rich plaque: Secondary | ICD-10-CM

## 2017-11-09 DIAGNOSIS — R600 Localized edema: Secondary | ICD-10-CM

## 2017-11-09 NOTE — Assessment & Plan Note (Signed)
Pedal edema is worse lately-no doubt from heat and prolonged standing at work  Hx of CAD and last echo rev- good EF / grade 1 DD No sob or cp (due for cardiology f/u and we will refer)  Suspect this is causing red rash as well  Recommend supp stockings to the knee (start with otc) Wt loss would help Dash eating plan-handout given Good water intake Elevate feet when able to sit  Inc lasix to 80 mg for 2 d only just to get swelling under control  F/u cardiol Update if not starting to improve in a week or if worsening

## 2017-11-09 NOTE — Assessment & Plan Note (Signed)
Disc in detail risks of smoking and possible outcomes including copd, vascular/ heart disease, cancer , respiratory and sinus infections  Pt voices understanding  She has cut down

## 2017-11-09 NOTE — Progress Notes (Signed)
Subjective:    Patient ID: Jennifer [name], female    DOB: 08-11-1955, 62 y.o.   MRN: [id number]  HPI  Here for rash on legs   Started 2 d ago  Noticed when she changed clothes  No itching or pain -just came out of nowhere No change in any of her products   Legs are puffy- more than usual  She takes a diuretic  Pretty good echo on 5/17 - some early DD , no CHF   Also sweats a lot at work very good conditioning is not good  Stands all day  Does not wear support stockings    (unsure if she would due to heat)   Never had a rash like this before  Does not shave legs  Soap - dial   Has not been outdoors much at all     Wt Readings from Last 3 Encounters:  11/09/17 242 lb 4 oz (109.9 kg)  11/02/17 240 lb (108.9 kg)  10/26/17 238 lb 12 oz (108.3 kg)   37.94 kg/m   bp is stable today  No cp or palpitations or headaches or edema  No side effects to medicines  BP Readings from Last 3 Encounters:  11/09/17 136/76  11/02/17 (!) 199/98  10/26/17 (!) 156/84     Patient Active Problem List   Diagnosis Date Noted  . Rash and nonspecific skin eruption 11/09/2017  . Low back pain 10/26/2017  . Screening mammogram, encounter for 09/18/2017  . CAD (coronary artery disease) 11/04/2015  . NSTEMI (non-ST elevated myocardial infarction) ([place]) 11/03/2015  . Hypothyroidism 07/06/2010  . Hyperlipidemia LDL goal <100 08/28/2008  . Essential hypertension 11/13/2007  . Diabetes type 2, controlled ([place]) 09/21/2006  . DIABETIC PERIPHERAL NEUROPATHY 09/21/2006  . Obesity (BMI 30-39.9) 09/21/2006  . TOBACCO ABUSE 09/21/2006  . Adjustment disorder with mixed anxiety and depressed mood 09/21/2006  . CARPAL TUNNEL SYNDROME 09/21/2006  . COPD (chronic obstructive pulmonary disease) ([place]) 09/21/2006  . EDEMA 09/21/2006  . MIGRAINES, HX OF 09/21/2006   Past Medical History:  Diagnosis Date  . CAD (coronary artery disease)    cath 5/23 100% dist RCA lesion treated with 2 overlapping  Integrity Resolute DES ( 2.25x20mm, 2.25x18mm), 60% mid RCA treated medically, 99% OM2 not amenable to PCI, 70% D1 lesion, EF normal  . Diabetes mellitus without complication ([place])   . Hypercholesteremia   . Hypertension   . Thyroid disease    Past Surgical History:  Procedure Laterality Date  . ABDOMINAL HYSTERECTOMY    . CARDIAC CATHETERIZATION N/A 11/03/2015   Procedure: Left Heart Cath and Coronary Angiography;  Surgeon: Leonie Man, MD;  Location: [facility];  Service: Cardiovascular;  Laterality: N/A;  . CARDIAC CATHETERIZATION N/A 11/03/2015   Procedure: Coronary Stent Intervention;  Surgeon: Leonie Man, MD;  Location: [facility];  Service: Cardiovascular;  Laterality: N/A;  . CARPAL TUNNEL RELEASE     Social History   Tobacco Use  . Smoking status: Current Some Day Smoker    Packs/day: 0.25    Types: Cigarettes  . Smokeless tobacco: Never Used  Substance Use Topics  . Alcohol use: No    Alcohol/week: 0.0 oz  . Drug use: No   Family History  Problem Relation Age of Onset  . Heart disease Mother   . Stroke Sister   . Heart attack Brother    No Known Allergies Current Outpatient Medications on File Prior to Visit  Medication Sig Dispense  Refill  . aspirin EC 81 MG EC tablet Take 1 tablet (81 mg total) by mouth daily.    Marland Kitchen atorvastatin (LIPITOR) 80 MG tablet TAKE 1 TABLET BY MOUTH ONCE DAILY AT 6 IN THE EVENING 30 tablet 0  . buPROPion (WELLBUTRIN SR) 150 MG 12 hr tablet Take 1 tablet (150 mg total) by mouth 2 (two) times daily. 180 tablet 3  . carvedilol (COREG) 12.5 MG tablet TAKE 1 TABLET BY MOUTH TWICE DAILY WITH MEALS 60 tablet 0  . diclofenac (VOLTAREN) 75 MG EC tablet TAKE 1 TABLET BY MOUTH TWICE DAILY WITH A MEAL 60 tablet 5  . Diclofenac Sodium 3 % GEL Use to knees, 4gm per dose, tid. (Patient taking differently: Apply 4 g topically 4 (four) times daily as needed (for pain). ) 5 Tube 5  . DULoxetine (CYMBALTA) 60 MG capsule Take 1 capsule  (60 mg total) by mouth daily. 90 capsule 3  . furosemide (LASIX) 40 MG tablet Take 1 tablet (40 mg total) by mouth daily. 90 tablet 3  . HYDROcodone-acetaminophen (NORCO/VICODIN) 5-325 MG tablet Take 1 tablet by mouth every 6 (six) hours as needed for moderate pain (Must last 14 days). 50 tablet 0  . insulin lispro (HUMALOG) 100 UNIT/ML injection Inject 40 Units into the skin daily as needed for high blood sugar.     . levothyroxine (SYNTHROID, LEVOTHROID) 112 MCG tablet Take 112 mcg by mouth every evening.     Marland Kitchen lisinopril (PRINIVIL,ZESTRIL) 20 MG tablet TAKE 1 TABLET BY MOUTH ONCE DAILY 15 tablet 0  . meloxicam (MOBIC) 15 MG tablet Take 1 tablet (15 mg total) by mouth daily as needed for pain (back pain). With a meal 30 tablet 1  . nitroGLYCERIN (NITROSTAT) 0.4 MG SL tablet Place 1 tablet (0.4 mg total) under the tongue every 5 (five) minutes x 3 doses as needed for chest pain. 25 tablet 3  . sitaGLIPtin (JANUVIA) 100 MG tablet Take 100 mg by mouth every evening.     . ticagrelor (BRILINTA) 60 MG TABS tablet Take 1 tablet (60 mg total) by mouth 2 (two) times daily. 180 tablet 3  . tiZANidine (ZANAFLEX) 4 MG tablet One by mouth every 8 hours as needed for spasm 90 tablet 1   No current facility-administered medications on file prior to visit.     Review of Systems  Constitutional: Positive for fatigue. Negative for activity change, appetite change, fever and unexpected weight change.  HENT: Negative for congestion, ear pain, rhinorrhea, sinus pressure and sore throat.   Eyes: Negative for pain, redness and visual disturbance.  Respiratory: Negative for cough, shortness of breath and wheezing.   Cardiovascular: Positive for leg swelling. Negative for chest pain and palpitations.  Gastrointestinal: Negative for abdominal pain, blood in stool, constipation and diarrhea.  Endocrine: Negative for polydipsia and polyuria.  Genitourinary: Negative for dysuria, frequency and urgency.    Musculoskeletal: Negative for arthralgias, back pain and myalgias.  Skin: Positive for rash. Negative for pallor.  Allergic/Immunologic: Negative for environmental allergies.  Neurological: Negative for dizziness, syncope and headaches.  Hematological: Negative for adenopathy. Does not bruise/bleed easily.  Psychiatric/Behavioral: Negative for decreased concentration and dysphoric mood. The patient is not nervous/anxious.        Objective:   Physical Exam  Constitutional: She appears well-developed and well-nourished. No distress.  obese and well appearing   HENT:  Head: Normocephalic and atraumatic.  Mouth/Throat: Oropharynx is clear and moist.  Eyes: Pupils are equal, round, and reactive to light.  Conjunctivae and EOM are normal.  Neck: Normal range of motion. Neck supple. No JVD present. Carotid bruit is not present. No thyromegaly present.  Cardiovascular: Normal rate, regular rhythm, normal heart sounds and intact distal pulses. Exam reveals no gallop.  Pulmonary/Chest: Effort normal and breath sounds normal. No respiratory distress. She has no wheezes. She has no rales.  No crackles  Diffusely distant bs   Abdominal: Soft. Bowel sounds are normal. She exhibits no distension, no abdominal bruit and no mass. There is no tenderness.  Musculoskeletal: She exhibits edema. She exhibits no tenderness.  One plus pedal edema to mid shin  Lymphadenopathy:    She has no cervical adenopathy.  Neurological: She is alert. She has normal reflexes.  Skin: Skin is warm and dry. Rash noted. No pallor.  Patchy erythematous macular non blanching rash on both legs from the knee down and stopping at ankles No tenderness No open areas or scale   Psychiatric: She has a normal mood and affect.  Pleasant           Assessment & Plan:   Problem List Items Addressed This Visit      Cardiovascular and Mediastinum   CAD (coronary artery disease)    Pt states she is overdue for f/u with  cardiology in [place] No sob/cp  Enc smoking cessation More pedal edema - rev last echo with grade 1 DD Will refer for that f/u appt On asa and Brillinta for stent      Relevant Orders   Ambulatory referral to Cardiology   Essential hypertension (Chronic)    bp in fair control at this time  BP Readings from Last 1 Encounters:  11/09/17 136/76   No changes needed Most recent labs reviewed  Disc lifstyle change with low sodium diet and exercise  Takes lisinopril Lasix 40 carvedilol         Musculoskeletal and Integument   Rash and nonspecific skin eruption - Primary    Lower legs only- macular w/o scale (looks petechial but not blanching)  Strongly suspect from pedal edema-will tx that (see a/p for that)  cardiol f/u Double lasix 2 d supp stockings to knee Elevate legs  Dec sodium/inc water intake Update if not starting to improve in a week or if worsening           Other   EDEMA    Pedal edema is worse lately-no doubt from heat and prolonged standing at work  Hx of CAD and last echo rev- good EF / grade 1 DD No sob or cp (due for cardiology f/u and we will refer)  Suspect this is causing red rash as well  Recommend supp stockings to the knee (start with otc) Wt loss would help Dash eating plan-handout given Good water intake Elevate feet when able to sit  Inc lasix to 80 mg for 2 d only just to get swelling under control  F/u cardiol Update if not starting to improve in a week or if worsening        Morbid obesity ([place])    Discussed how this problem influences overall health and the risks it imposes  Reviewed plan for weight loss with lower calorie diet (via better food choices and also portion control or program like weight watchers) and exercise building up to or more than 30 minutes 5 days per week including some aerobic activity         TOBACCO ABUSE (Chronic)    Disc in detail risks of smoking  and possible outcomes including copd, vascular/ heart  disease, cancer , respiratory and sinus infections  Pt voices understanding  She has cut down

## 2017-11-09 NOTE — Patient Instructions (Addendum)
I think your rash is related to your swelling  Get some over the counter support stockings to the knee - and wear while working and standing  When sitting- put feet up  Stay cool when you can   Avoid excess sodium in your diet   (DASH eating plan)  Make sure to drink enough water   Double your lasix for 2 days only then go back to regular dose   We will get you set up with cardiology

## 2017-11-09 NOTE — Assessment & Plan Note (Addendum)
Pt states she is overdue for f/u with cardiology in [place] No sob/cp  Enc smoking cessation More pedal edema - rev last echo with grade 1 DD Will refer for that f/u appt On asa and Brillinta for stent

## 2017-11-09 NOTE — Assessment & Plan Note (Signed)
Discussed how this problem influences overall health and the risks it imposes  Reviewed plan for weight loss with lower calorie diet (via better food choices and also portion control or program like weight watchers) and exercise building up to or more than 30 minutes 5 days per week including some aerobic activity    

## 2017-11-09 NOTE — Assessment & Plan Note (Signed)
bp in fair control at this time  BP Readings from Last 1 Encounters:  11/09/17 136/76   No changes needed Most recent labs reviewed  Disc lifstyle change with low sodium diet and exercise  Takes lisinopril Lasix 40 carvedilol

## 2017-11-09 NOTE — Assessment & Plan Note (Signed)
Lower legs only- macular w/o scale (looks petechial but not blanching)  Strongly suspect from pedal edema-will tx that (see a/p for that)  cardiol f/u Double lasix 2 d supp stockings to knee Elevate legs  Dec sodium/inc water intake Update if not starting to improve in a week or if worsening

## 2017-11-13 DIAGNOSIS — E113512 Type 2 diabetes mellitus with proliferative diabetic retinopathy with macular edema, left eye: Secondary | ICD-10-CM | POA: Diagnosis not present

## 2017-11-13 DIAGNOSIS — H25811 Combined forms of age-related cataract, right eye: Secondary | ICD-10-CM | POA: Diagnosis not present

## 2017-11-13 DIAGNOSIS — H4312 Vitreous hemorrhage, left eye: Secondary | ICD-10-CM | POA: Diagnosis not present

## 2017-11-13 DIAGNOSIS — E113592 Type 2 diabetes mellitus with proliferative diabetic retinopathy without macular edema, left eye: Secondary | ICD-10-CM | POA: Diagnosis not present

## 2017-11-13 DIAGNOSIS — H2513 Age-related nuclear cataract, bilateral: Secondary | ICD-10-CM | POA: Diagnosis not present

## 2017-11-13 DIAGNOSIS — H25812 Combined forms of age-related cataract, left eye: Secondary | ICD-10-CM | POA: Diagnosis not present

## 2017-11-14 ENCOUNTER — Emergency Department (HOSPITAL_COMMUNITY): Payer: 59

## 2017-11-14 ENCOUNTER — Telehealth: Payer: Self-pay | Admitting: *Deleted

## 2017-11-14 ENCOUNTER — Other Ambulatory Visit: Payer: Self-pay

## 2017-11-14 ENCOUNTER — Observation Stay (HOSPITAL_COMMUNITY)
Admission: EM | Admit: 2017-11-14 | Discharge: 2017-11-16 | Disposition: A | Discharge status: Home / Self Care | Payer: 59 | Attending: Family Medicine | Admitting: Family Medicine

## 2017-11-14 ENCOUNTER — Observation Stay (HOSPITAL_COMMUNITY): Payer: 59

## 2017-11-14 ENCOUNTER — Encounter (HOSPITAL_COMMUNITY): Payer: Self-pay

## 2017-11-14 DIAGNOSIS — Z794 Long term (current) use of insulin: Secondary | ICD-10-CM | POA: Diagnosis not present

## 2017-11-14 DIAGNOSIS — E119 Type 2 diabetes mellitus without complications: Secondary | ICD-10-CM | POA: Diagnosis not present

## 2017-11-14 DIAGNOSIS — I2510 Atherosclerotic heart disease of native coronary artery without angina pectoris: Secondary | ICD-10-CM | POA: Diagnosis present

## 2017-11-14 DIAGNOSIS — R2990 Unspecified symptoms and signs involving the nervous system: Secondary | ICD-10-CM | POA: Diagnosis not present

## 2017-11-14 DIAGNOSIS — E785 Hyperlipidemia, unspecified: Secondary | ICD-10-CM | POA: Insufficient documentation

## 2017-11-14 DIAGNOSIS — Z6830 Body mass index (BMI) 30.0-30.9, adult: Secondary | ICD-10-CM | POA: Insufficient documentation

## 2017-11-14 DIAGNOSIS — E11319 Type 2 diabetes mellitus with unspecified diabetic retinopathy without macular edema: Secondary | ICD-10-CM

## 2017-11-14 DIAGNOSIS — I639 Cerebral infarction, unspecified: Principal | ICD-10-CM | POA: Insufficient documentation

## 2017-11-14 DIAGNOSIS — F17200 Nicotine dependence, unspecified, uncomplicated: Secondary | ICD-10-CM | POA: Diagnosis present

## 2017-11-14 DIAGNOSIS — I2583 Coronary atherosclerosis due to lipid rich plaque: Secondary | ICD-10-CM | POA: Diagnosis not present

## 2017-11-14 DIAGNOSIS — R200 Anesthesia of skin: Secondary | ICD-10-CM | POA: Diagnosis present

## 2017-11-14 DIAGNOSIS — E039 Hypothyroidism, unspecified: Secondary | ICD-10-CM | POA: Diagnosis not present

## 2017-11-14 DIAGNOSIS — E113512 Type 2 diabetes mellitus with proliferative diabetic retinopathy with macular edema, left eye: Secondary | ICD-10-CM

## 2017-11-14 DIAGNOSIS — Z79899 Other long term (current) drug therapy: Secondary | ICD-10-CM | POA: Insufficient documentation

## 2017-11-14 DIAGNOSIS — Z7982 Long term (current) use of aspirin: Secondary | ICD-10-CM | POA: Insufficient documentation

## 2017-11-14 DIAGNOSIS — E669 Obesity, unspecified: Secondary | ICD-10-CM | POA: Diagnosis not present

## 2017-11-14 DIAGNOSIS — I10 Essential (primary) hypertension: Secondary | ICD-10-CM | POA: Diagnosis not present

## 2017-11-14 DIAGNOSIS — F17210 Nicotine dependence, cigarettes, uncomplicated: Secondary | ICD-10-CM | POA: Diagnosis not present

## 2017-11-14 DIAGNOSIS — R202 Paresthesia of skin: Secondary | ICD-10-CM | POA: Diagnosis not present

## 2017-11-14 DIAGNOSIS — E1140 Type 2 diabetes mellitus with diabetic neuropathy, unspecified: Secondary | ICD-10-CM | POA: Insufficient documentation

## 2017-11-14 DIAGNOSIS — Z720 Tobacco use: Secondary | ICD-10-CM

## 2017-11-14 DIAGNOSIS — I6523 Occlusion and stenosis of bilateral carotid arteries: Secondary | ICD-10-CM | POA: Diagnosis not present

## 2017-11-14 DIAGNOSIS — Z8673 Personal history of transient ischemic attack (TIA), and cerebral infarction without residual deficits: Secondary | ICD-10-CM | POA: Diagnosis present

## 2017-11-14 DIAGNOSIS — Z9861 Coronary angioplasty status: Secondary | ICD-10-CM

## 2017-11-14 DIAGNOSIS — I6389 Other cerebral infarction: Secondary | ICD-10-CM | POA: Diagnosis not present

## 2017-11-14 DIAGNOSIS — E1169 Type 2 diabetes mellitus with other specified complication: Secondary | ICD-10-CM | POA: Diagnosis present

## 2017-11-14 LAB — RAPID URINE DRUG SCREEN, HOSP PERFORMED
Amphetamines: NOT DETECTED
BARBITURATES: NOT DETECTED
Benzodiazepines: NOT DETECTED
COCAINE: NOT DETECTED
Opiates: NOT DETECTED
Tetrahydrocannabinol: NOT DETECTED

## 2017-11-14 LAB — DIFFERENTIAL
BASOS ABS: 0 10*3/uL (ref 0.0–0.1)
Basophils Relative: 0 %
EOS ABS: 0.1 10*3/uL (ref 0.0–0.7)
Eosinophils Relative: 2 %
LYMPHS ABS: 1.3 10*3/uL (ref 0.7–4.0)
LYMPHS PCT: 17 %
Monocytes Absolute: 0.5 10*3/uL (ref 0.1–1.0)
Monocytes Relative: 7 %
NEUTROS PCT: 74 %
Neutro Abs: 5.4 10*3/uL (ref 1.7–7.7)

## 2017-11-14 LAB — I-STAT CHEM 8, ED
BUN: 13 mg/dL (ref 6–20)
Calcium, Ion: 1.17 mmol/L (ref 1.15–1.40)
Chloride: 100 mmol/L — ABNORMAL LOW (ref 101–111)
Creatinine, Ser: 0.8 mg/dL (ref 0.44–1.00)
Glucose, Bld: 201 mg/dL — ABNORMAL HIGH (ref 65–99)
HEMATOCRIT: 39 % (ref 36.0–46.0)
HEMOGLOBIN: 13.3 g/dL (ref 12.0–15.0)
POTASSIUM: 3.4 mmol/L — AB (ref 3.5–5.1)
SODIUM: 141 mmol/L (ref 135–145)
TCO2: 29 mmol/L (ref 22–32)

## 2017-11-14 LAB — COMPREHENSIVE METABOLIC PANEL
ALBUMIN: 2.9 g/dL — AB (ref 3.5–5.0)
ALK PHOS: 69 U/L (ref 38–126)
ALT: 21 U/L (ref 14–54)
AST: 20 U/L (ref 15–41)
Anion gap: 7 (ref 5–15)
BILIRUBIN TOTAL: 0.6 mg/dL (ref 0.3–1.2)
BUN: 14 mg/dL (ref 6–20)
CO2: 31 mmol/L (ref 22–32)
Calcium: 8.7 mg/dL — ABNORMAL LOW (ref 8.9–10.3)
Chloride: 102 mmol/L (ref 101–111)
Creatinine, Ser: 0.8 mg/dL (ref 0.44–1.00)
GFR calc Af Amer: >60 mL/min (ref >60)
GFR calc non Af Amer: >60 mL/min (ref >60)
GLUCOSE: 214 mg/dL — AB (ref 65–99)
POTASSIUM: 3.6 mmol/L (ref 3.5–5.1)
SODIUM: 140 mmol/L (ref 135–145)
TOTAL PROTEIN: 6 g/dL — AB (ref 6.5–8.1)

## 2017-11-14 LAB — URINALYSIS, ROUTINE W REFLEX MICROSCOPIC
Bilirubin Urine: NEGATIVE
Glucose, UA: NEGATIVE mg/dL
Hgb urine dipstick: NEGATIVE
Ketones, ur: NEGATIVE mg/dL
LEUKOCYTES UA: NEGATIVE
Nitrite: NEGATIVE
PROTEIN: 100 mg/dL — AB
SPECIFIC GRAVITY, URINE: 1.005 (ref 1.005–1.030)
pH: 6 (ref 5.0–8.0)

## 2017-11-14 LAB — CBC
HCT: 41 % (ref 36.0–46.0)
HEMOGLOBIN: 12.9 g/dL (ref 12.0–15.0)
MCH: 27.3 pg (ref 26.0–34.0)
MCHC: 31.5 g/dL (ref 30.0–36.0)
MCV: 86.7 fL (ref 78.0–100.0)
Platelets: 243 10*3/uL (ref 150–400)
RBC: 4.73 MIL/uL (ref 3.87–5.11)
RDW: 13.7 % (ref 11.5–15.5)
WBC: 7.4 10*3/uL (ref 4.0–10.5)

## 2017-11-14 LAB — TSH: TSH: 1.837 u[IU]/mL (ref 0.350–4.500)

## 2017-11-14 LAB — GLUCOSE, CAPILLARY: Glucose-Capillary: 112 mg/dL — ABNORMAL HIGH (ref 65–99)

## 2017-11-14 LAB — MAGNESIUM: MAGNESIUM: 1.8 mg/dL (ref 1.7–2.4)

## 2017-11-14 LAB — ETHANOL

## 2017-11-14 LAB — PROTIME-INR
INR: 0.99
PROTHROMBIN TIME: 13 s (ref 11.4–15.2)

## 2017-11-14 LAB — HEMOGLOBIN A1C
HEMOGLOBIN A1C: 7.1 % — AB (ref 4.8–5.6)
MEAN PLASMA GLUCOSE: 157.07 mg/dL

## 2017-11-14 LAB — APTT: APTT: 30 s (ref 24–36)

## 2017-11-14 LAB — I-STAT TROPONIN, ED: Troponin i, poc: 0 ng/mL (ref 0.00–0.08)

## 2017-11-14 LAB — CBG MONITORING, ED: Glucose-Capillary: 121 mg/dL — ABNORMAL HIGH (ref 65–99)

## 2017-11-14 LAB — PHOSPHORUS: PHOSPHORUS: 4.4 mg/dL (ref 2.5–4.6)

## 2017-11-14 MED ORDER — ATORVASTATIN CALCIUM 40 MG PO TABS
80.0000 mg | ORAL_TABLET | Freq: Every day | ORAL | Status: DC
  Administered 2017-11-14 – 2017-11-15 (×2): 80 mg via ORAL
  Filled 2017-11-14 (×3): qty 2

## 2017-11-14 MED ORDER — TICAGRELOR 60 MG PO TABS
60.0000 mg | ORAL_TABLET | Freq: Two times a day (BID) | ORAL | Status: DC
  Administered 2017-11-14 – 2017-11-16 (×4): 60 mg via ORAL
  Filled 2017-11-14 (×6): qty 1

## 2017-11-14 MED ORDER — ACETAMINOPHEN 650 MG RE SUPP: 650.0000 mg | RECTAL | Status: DC | PRN

## 2017-11-14 MED ORDER — ACETAMINOPHEN 325 MG PO TABS: 650.0000 mg | ORAL_TABLET | ORAL | Status: DC | PRN

## 2017-11-14 MED ORDER — HEPARIN SODIUM (PORCINE) 5000 UNIT/ML IJ SOLN
5000.0000 [IU] | Freq: Three times a day (TID) | INTRAMUSCULAR | Status: DC
  Administered 2017-11-14 – 2017-11-16 (×5): 5000 [IU] via SUBCUTANEOUS
  Filled 2017-11-14 (×5): qty 1

## 2017-11-14 MED ORDER — FUROSEMIDE 40 MG PO TABS
40.0000 mg | ORAL_TABLET | Freq: Every day | ORAL | Status: DC
  Administered 2017-11-14 – 2017-11-16 (×3): 40 mg via ORAL
  Filled 2017-11-14 (×3): qty 1

## 2017-11-14 MED ORDER — BUPROPION HCL ER (SR) 150 MG PO TB12
150.0000 mg | ORAL_TABLET | Freq: Two times a day (BID) | ORAL | Status: DC
  Administered 2017-11-14 – 2017-11-16 (×4): 150 mg via ORAL
  Filled 2017-11-14 (×5): qty 1

## 2017-11-14 MED ORDER — POTASSIUM CHLORIDE CRYS ER 20 MEQ PO TBCR: 40.0000 meq | EXTENDED_RELEASE_TABLET | ORAL | Status: DC

## 2017-11-14 MED ORDER — ASPIRIN EC 81 MG PO TBEC
81.0000 mg | DELAYED_RELEASE_TABLET | Freq: Every day | ORAL | Status: DC
  Administered 2017-11-15: 81 mg via ORAL
  Filled 2017-11-14: qty 1

## 2017-11-14 MED ORDER — LISINOPRIL 10 MG PO TABS
20.0000 mg | ORAL_TABLET | Freq: Every day | ORAL | Status: DC
  Administered 2017-11-14 – 2017-11-16 (×3): 20 mg via ORAL
  Filled 2017-11-14 (×3): qty 2

## 2017-11-14 MED ORDER — ACETAMINOPHEN 160 MG/5ML PO SOLN: 650.0000 mg | ORAL | Status: DC | PRN

## 2017-11-14 MED ORDER — POTASSIUM CHLORIDE CRYS ER 20 MEQ PO TBCR
40.0000 meq | EXTENDED_RELEASE_TABLET | ORAL | Status: AC
  Administered 2017-11-14 (×2): 40 meq via ORAL
  Filled 2017-11-14 (×2): qty 2

## 2017-11-14 MED ORDER — SENNOSIDES-DOCUSATE SODIUM 8.6-50 MG PO TABS: 1.0000 | ORAL_TABLET | Freq: Every evening | ORAL | Status: DC | PRN

## 2017-11-14 MED ORDER — INSULIN ASPART 100 UNIT/ML ~~LOC~~ SOLN
0.0000 [IU] | Freq: Three times a day (TID) | SUBCUTANEOUS | Status: DC
  Administered 2017-11-15: 4.1 [IU] via SUBCUTANEOUS
  Administered 2017-11-16: 0.2 [IU] via SUBCUTANEOUS
  Administered 2017-11-16: 2.2 [IU] via SUBCUTANEOUS

## 2017-11-14 MED ORDER — INSULIN DETEMIR 100 UNIT/ML ~~LOC~~ SOLN
5.0000 [IU] | Freq: Every day | SUBCUTANEOUS | Status: DC
  Administered 2017-11-14 – 2017-11-15 (×2): 5 [IU] via SUBCUTANEOUS
  Filled 2017-11-14 (×3): qty 0.05

## 2017-11-14 MED ORDER — SODIUM CHLORIDE 0.9 % IV SOLN
INTRAVENOUS | Status: DC
  Administered 2017-11-14 – 2017-11-15 (×2): via INTRAVENOUS

## 2017-11-14 MED ORDER — STROKE: EARLY STAGES OF RECOVERY BOOK
Freq: Once | Status: AC
  Administered 2017-11-14: 18:00:00
  Filled 2017-11-14 (×2): qty 1

## 2017-11-14 MED ORDER — LEVOTHYROXINE SODIUM 112 MCG PO TABS
112.0000 ug | ORAL_TABLET | Freq: Every day | ORAL | Status: DC
  Administered 2017-11-14 – 2017-11-15 (×2): 112 ug via ORAL
  Filled 2017-11-14 (×2): qty 1

## 2017-11-14 MED ORDER — DULOXETINE HCL 60 MG PO CPEP
60.0000 mg | ORAL_CAPSULE | Freq: Every day | ORAL | Status: DC
  Administered 2017-11-14 – 2017-11-16 (×3): 60 mg via ORAL
  Filled 2017-11-14 (×3): qty 1

## 2017-11-14 MED ORDER — CARVEDILOL 12.5 MG PO TABS
12.5000 mg | ORAL_TABLET | Freq: Two times a day (BID) | ORAL | Status: DC
  Administered 2017-11-14 – 2017-11-16 (×4): 12.5 mg via ORAL
  Filled 2017-11-14 (×4): qty 1

## 2017-11-14 NOTE — Telephone Encounter (Signed)
Pt calls and states that she has been having left sided tingling and warm sensation. Pt reports that this started last night just before going to bed and has continued until now. Pt reports that when she smiles one side is higher than the other. Pt encouraged to be seen in the ER.  Pt voiced understanding.

## 2017-11-14 NOTE — ED Notes (Signed)
Pt states she has to work tonight and does not know if she can stay in the hospital.

## 2017-11-14 NOTE — H&P (Addendum)
History and Physical    TISH BEGIN BMW:[id number] DOB: 1955-10-27 DOA: 11/14/2017  Referring MD/NP/PA: Dr. Sabra Heck. PCP: Abner Greenspan, MD  Patient coming from: Home  Chief Complaint: Left side paresthesia, numbness and blurred vision.  HPI: Jennifer [name] is a 62 y.o. female with past medical history significant for coronary artery disease status post PCI, diabetes mellitus, type II obesity, hypertension, hypothyroidism and hypercholesterolemia; who presented to the emergency department secondary to acute blurred vision, left-sided paresthesia and numbness.  Patient was last seen normal on 11/13/2017. Patient reports sudden onset of the symptoms for what she thought were associated with her eye cataracts.  Given the numbness and tingling sensation on the left side of her body, she decided to come to the emergency department for further evaluation.  She expressed that the symptoms has no change or worsen since presentation.  She denies any motor deficit.  Also denies chest pain, shortness of breath, headache, abdominal pain, nausea, vomiting, hematuria, melena, hematochezia, hematemesis or any other acute complaints.  In the ED, CT scan of the head demonstrated no acute intracranial abnormalities and overall blood work was stable.  Case was discussed with tele-neurology who recommended observation and a stroke work-up.  Past Medical/Surgical History: Past Medical History:  Diagnosis Date  . CAD (coronary artery disease)    cath 5/23 100% dist RCA lesion treated with 2 overlapping Integrity Resolute DES ( 2.25x18mm, 2.25x83mm), 60% mid RCA treated medically, 99% OM2 not amenable to PCI, 70% D1 lesion, EF normal  . Diabetes mellitus without complication ([place])   . Hypercholesteremia   . Hypertension   . Thyroid disease     Past Surgical History:  Procedure Laterality Date  . ABDOMINAL HYSTERECTOMY    . CARDIAC CATHETERIZATION N/A 11/03/2015   Procedure: Left Heart Cath and Coronary  Angiography;  Surgeon: Leonie Man, MD;  Location: [facility];  Service: Cardiovascular;  Laterality: N/A;  . CARDIAC CATHETERIZATION N/A 11/03/2015   Procedure: Coronary Stent Intervention;  Surgeon: Leonie Man, MD;  Location: [facility];  Service: Cardiovascular;  Laterality: N/A;  . CARPAL TUNNEL RELEASE      Social History:  reports that she has been smoking cigarettes.  She has been smoking about 0.25 packs per day. She has never used smokeless tobacco. She reports that she does not drink alcohol or use drugs.  Allergies: No Known Allergies  Family History: Family History  Problem Relation Age of Onset  . Heart disease Mother   . Stroke Sister   . Heart attack Brother     Prior to Admission medications   Medication Sig Start Date End Date Taking? Authorizing Provider  aspirin EC 81 MG EC tablet Take 1 tablet (81 mg total) by mouth daily. 11/05/15  Yes Meng, Isaac Laud, PA  atorvastatin (LIPITOR) 80 MG tablet TAKE 1 TABLET BY MOUTH ONCE DAILY AT 6 IN THE EVENING 10/26/17  Yes Herminio Commons, MD  buPROPion (WELLBUTRIN SR) 150 MG 12 hr tablet Take 1 tablet (150 mg total) by mouth 2 (two) times daily. 09/18/17  Yes [name], [name], MD  carvedilol (COREG) 12.5 MG tablet TAKE 1 TABLET BY MOUTH TWICE DAILY WITH MEALS 10/26/17  Yes Herminio Commons, MD  diclofenac (VOLTAREN) 75 MG EC tablet TAKE 1 TABLET BY MOUTH TWICE DAILY WITH A MEAL 10/26/17  Yes Sanjuana Kava, MD  DULoxetine (CYMBALTA) 60 MG capsule Take 1 capsule (60 mg total) by mouth daily. 09/18/17  Yes [name], [employer],  MD  furosemide (LASIX) 40 MG tablet Take 1 tablet (40 mg total) by mouth daily. 01/12/17  Yes Herminio Commons, MD  insulin lispro (HUMALOG) 100 UNIT/ML injection Inject 40 Units into the skin daily as needed for high blood sugar.    Yes [provider]  levothyroxine (SYNTHROID, LEVOTHROID) 112 MCG tablet Take 112 mcg by mouth every evening.    Yes [provider]  lisinopril  (PRINIVIL,ZESTRIL) 20 MG tablet TAKE 1 TABLET BY MOUTH ONCE DAILY 08/28/17  Yes Herminio Commons, MD  nitroGLYCERIN (NITROSTAT) 0.4 MG SL tablet Place 1 tablet (0.4 mg total) under the tongue every 5 (five) minutes x 3 doses as needed for chest pain. 11/05/15  Yes Almyra Deforest, PA  sitaGLIPtin (JANUVIA) 100 MG tablet Take 100 mg by mouth every evening.    Yes [provider]  ticagrelor (BRILINTA) 60 MG TABS tablet Take 1 tablet (60 mg total) by mouth 2 (two) times daily. 01/12/17  Yes Herminio Commons, MD    Review of Systems:  Constitutional: Denies fever, chills, diaphoresis, appetite change and fatigue.  HEENT: Denies photophobia, eye pain, redness, hearing loss, ear pain, congestion, sore throat, rhinorrhea, sneezing, mouth sores, trouble swallowing, neck pain, neck stiffness and tinnitus.   Respiratory: Denies SOB, DOE, cough, chest tightness,  and wheezing.   Cardiovascular: Denies chest pain, palpitations and leg swelling.  Gastrointestinal: Denies nausea, vomiting, abdominal pain, diarrhea, constipation, blood in stool and abdominal distention.  Genitourinary: Denies dysuria, urgency, frequency, hematuria, flank pain and difficulty urinating.  Endocrine: Denies: hot or cold intolerance, sweats, changes in hair or nails, polyuria, polydipsia. Musculoskeletal: Denies myalgias, back pain, joint swelling, arthralgias and gait problem.  Skin: Denies pallor, rash and wound.  Neurological: Negative except as otherwise mentioned in HPI; patient with left-sided paresthesias, numbness and blurred vision. Hematological: Denies adenopathy. Easy bruising, personal or family bleeding history  Psychiatric/Behavioral: Denies suicidal ideation, mood changes, confusion, nervousness, sleep disturbance and agitation    Physical Exam: Vitals:   11/14/17 1100 11/14/17 1200 11/14/17 1600 11/14/17 1630  BP: (!) 169/67 (!) 172/72 (!) 199/84 (!) 190/82  Pulse: 67 67 71   Resp: (!) 22 15    Temp:       TempSrc:      SpO2: 99% 96% 100%   Weight:      Height:        Constitutional: NAD, calm, comfortable; no chest pain, no shortness of breath.  Patient in no acute distress. Eyes: PERRL, lids and conjunctivae normal, no icterus, no nystagmus. ENMT: Mucous membranes are moist. Posterior pharynx clear of any exudate or lesions. Denture in place.  Neck: normal, supple, no masses, no thyromegaly, no JVD. Respiratory: clear to auscultation bilaterally, no wheezing, no crackles. Normal respiratory effort. No accessory muscle use.  Cardiovascular: Regular rate and rhythm, no murmurs / rubs / gallops. No extremity edema. 2+ pedal pulses. No carotid bruits.  Abdomen: no tenderness, no masses palpated. No hepatosplenomegaly. Bowel sounds positive.  Musculoskeletal: no clubbing / cyanosis. No joint deformity upper and lower extremities. Good ROM, no contractures. Normal muscle tone.  Skin: no open wounds, induration or petechiae.  Neurologic: CN 2-12 grossly intact. DTR normal. Strength 5/5 in all 4.  No pronation drift, normal finger-to-nose and heel-to-shin.  Negative Romberg test. Psychiatric: Normal judgment and insight. Alert and oriented x 3. Normal mood.    Labs on Admission: I have personally reviewed the following labs and imaging studies  CBC: Recent Labs  Lab 11/14/17 1109  11/14/17 1124  WBC 7.4  --   NEUTROABS 5.4  --   HGB 12.9 13.3  HCT 41.0 39.0  MCV 86.7  --   PLT 243  --    Basic Metabolic Panel: Recent Labs  Lab 11/14/17 1109 11/14/17 1124 11/14/17 1238  NA 140 141  --   K 3.6 3.4*  --   CL 102 100*  --   CO2 31  --   --   GLUCOSE 214* 201*  --   BUN 14 13  --   CREATININE 0.80 0.80  --   CALCIUM 8.7*  --   --   MG  --   --  1.8  PHOS  --   --  4.4   GFR: Estimated Creatinine Clearance: 93.1 mL/min (by C-G formula based on SCr of 0.8 mg/dL).   Liver Function Tests: Recent Labs  Lab 11/14/17 1109  AST 20  ALT 21  ALKPHOS 69  BILITOT 0.6  PROT  6.0*  ALBUMIN 2.9*   Coagulation Profile: Recent Labs  Lab 11/14/17 1219  INR 0.99   CBG: Recent Labs  Lab 11/14/17 1422  GLUCAP 121*   Lipid Profile: No results for input(s): CHOL, HDL, LDLCALC, TRIG, CHOLHDL, LDLDIRECT in the last 72 hours.   Thyroid Function Tests: Recent Labs    11/14/17 1109  TSH 1.837   Urine analysis:    Component Value Date/Time   COLORURINE STRAW (A) 11/14/2017 1109   APPEARANCEUR CLEAR 11/14/2017 1109   LABSPEC 1.005 11/14/2017 1109   PHURINE 6.0 11/14/2017 1109   GLUCOSEU NEGATIVE 11/14/2017 1109   HGBUR NEGATIVE 11/14/2017 1109   [place] 11/14/2017 1109   BILIRUBINUR Negative 10/26/2017 0911   KETONESUR NEGATIVE 11/14/2017 1109   PROTEINUR 100 (A) 11/14/2017 1109   UROBILINOGEN 0.2 10/26/2017 0911   NITRITE NEGATIVE 11/14/2017 1109   LEUKOCYTESUR NEGATIVE 11/14/2017 1109    Radiological Exams on Admission: Ct Head Wo Contrast  Result Date: 11/14/2017 CLINICAL DATA:  Paresthesia, blurred vision. EXAM: CT HEAD WITHOUT CONTRAST TECHNIQUE: Contiguous axial images were obtained from the base of the skull through the vertex without intravenous contrast. COMPARISON:  CT scan of September 27, 2004. FINDINGS: Brain: Mild chronic ischemic white matter disease is noted. Old lacunar infarction is noted in right basal ganglia. No mass effect or midline shift is noted. Ventricular size is within normal limits. There is no evidence of mass lesion, hemorrhage or acute infarction. Vascular: No hyperdense vessel or unexpected calcification. Skull: Normal. Negative for fracture or focal lesion. Sinuses/Orbits: No acute finding. Other: None. IMPRESSION: Mild chronic ischemic white matter disease. No acute intracranial abnormality seen. Electronically Signed   By: Marijo Conception, M.D.   On: 11/14/2017 12:14    EKG: Independently reviewed.  Sinus rhythm, no acute ischemic changes appreciated; normal axis.  Assessment/Plan 1-stroke-like symptoms:  Patient with left hemiparesis/numbness and blurred vision. -She was last seen normal on 11/13/17; so out of therapeutic window for TPA -She was assessed by tele-neurology while in the ED who recommended admission for stroke work-up. -CT head negative for any acute intracranial abnormalities. -Patient admitted to telemetry, MRI/MRA order, 2-Decho,  Dopplers, PT and OT evaluation, permissive hypertension. -Will check A1c, lipid panel, TSH, Mg, PO4 and B12. -neurology consulted -continue ASA and Brillinta  2-Diabetes type 2, controlled ([place]) -As mentioned above will check A1c -Holding oral hypoglycemic regimen while inpatient -Low-dose long acting and sliding scale insulin has been ordered.   3-Hyperlipidemia LDL goal <100 -will check lipid  panel -Continue statins  4-Obesity (BMI 30-39.9) -Portion control, low calorie diet and increase physical activity has been discussed with patient -Body mass index is 37.9 kg/m.  5-Essential hypertension -Will continue home antihypertensive regimen while allowing for permissive hypertension -Vital signs stable with a blood pressure systolic in the 174 range currently.  6-Hypothyroidism -Check TSH -Continue Synthroid.  7-CAD (coronary artery disease) -Patient denies chest pain and shortness of breath -Continue aspirin, Brilinta -Continue beta-blockers and statins. -Will monitor on telemetry.  8-depression: -No suicidal ideation or hallucination -Continue Wellbutrin and Cymbalta.  9-hypokalemia: -Will replete electrolyte and follow trend -Check magnesium and phosphorus level.  10-tobacco abuse: -I have discussed tobacco cessation with the patient.  I have counseled the patient regarding the negative impacts of continued tobacco use including but not limited to lung cancer, COPD, and cardiovascular disease.  I have discussed alternatives to tobacco and modalities that may help facilitate tobacco cessation including but not limited to  biofeedback, hypnosis, and medications.  Total time spent with tobacco counseling was 4 minutes. -nicotine patch decline.   DVT prophylaxis: Heparin Code Status: Full code Family Communication: Daughter at bedside Disposition Plan: Anticipate discharge back home once TIA/CVA work-up completed.   Consults called: Neurology service Admission status: Observation, telemetry bed, length of stay less than 2 midnights.   Time Spent: 70 minutes  Barton Dubois MD [facility] Pager [phone]  If 7PM-7AM, please contact night-coverage [email or web address] Password [facility]  11/14/2017, 5:34 PM

## 2017-11-14 NOTE — Progress Notes (Signed)
   [facility] Tele[facility]urology Consult Services  Impression:  Stroke r/o right subcortical lacunar infarct.   Not a tpa candidate due to: symptoms present yesterday, outside the window Symptoms may be consistent with LVO therefore would obtain CTA head and neck and perfusion. She feels its from chronic retinopathy and cataracts.    Differential Diagnosis:   1. Cardioembolic stroke  2. Small vessel disease/lacune  3. Thromboembolic, artery-to-artery mechanism  4. Hypercoagulable state-related infarct  5. Transient ischemic attack  6. Thrombotic mechanism, large artery disease    Recommendations:  -MRI brain -continue asa and brilinta -continue statin -DVT prophylaxis -NPO -permissive HTN -telemetry monitoring -obtain lipid panel and HgA1C -2d echo Inpatient neurology consultation Discussed with ED MD Please call with questions  -----------------------------------------------------------------------------------------  CC  History of Present Illness   Patient is a  62 yr old woman with PMH of DMII, HTN, HLD, hypothyroidism, CAD s/p stent who presents with complaints of left sided tingling since 10pm yesterday evening. She has diabetic retinopathy and has had laser surgery and cataract surgery in the past. She noted blurry vision 6 days ago and she had a fu with ophthalmologist and concern for worsening cataract. The tingling on the left involves her face, arm and leg. No slurred speech,  no tremors, no coordination difficulties. She reports a mild headache. Denies CP or dizziness. brilinta and asa. Compliant but states she misses some doses.  +smoking  Diagnostic: imaging pending  Exam: 1a- LOC: Keenly responsive - 0   1b- LOC questions: Answers both questions correctly - 0     1c- LOC commands- Performs both tasks correctly- 0     2- Gaze: Normal; no gaze paresis or gaze deviation - 0     3- Visual Fields: right Visual field deficit - 2 (may be chronic per patient)      4- Facial movements: no facial palsy - 0     5- Upper limb motor - no drift -0     6- Lower limb motor - no drift - 0      7- Limb Coordination: absent ataxia - 0      8- Sensory : left  sensory loss - 1     9- Language - No aphasia - 0      10- Speech - No dysarthria -0     11- Neglect / Extinction - none found -0     NIHSS score   3    Medical Decision Making:  - Extensive number of diagnosis or management options are considered above.   - Extensive amount of complex data reviewed.   - High risk of complication and/or morbidity or mortality are associated with differential diagnostic considerations above.  - There may be Uncertain outcome and increased probability of prolonged functional impairment or high probability of severe prolonged functional impairment associated with some of these differential diagnosis.  Medical Data Reviewed:  1.Data reviewed include clinical labs, radiology,  Medical Tests;   2.Tests results discussed w/performing or interpreting physician;   3.Obtaining/reviewing old medical records;  4.Obtaining case history from another source;  5.Independent review of image, tracing or specimen.    Patient was informed the Neurology Consult would happen via telehealth (remote video) and consented to receiving care in this manner.

## 2017-11-14 NOTE — ED Triage Notes (Signed)
Pt has been experiencing left sided tingling on the left side of her body for 12 hours. Started last night. Pt states it feels as if her left side will go to sleep for a bit and then the feeling will come back. No vision difficulty other than cataracts. Pt ambulatory with steady gait

## 2017-11-14 NOTE — ED Notes (Signed)
Pt receiving tele-neurology consult at this time.

## 2017-11-14 NOTE — ED Notes (Signed)
C/o feeling tingly all on left side that started last night at 10 pm.   Left eye blurred vision  since Thursday night.  Seen eye doctor and was told she had a cataract.

## 2017-11-14 NOTE — ED Notes (Signed)
Spoke with Dr. Dyann Kief.  And notified that pt does not want to stay in the hospital.  MD coming to talk to pt.

## 2017-11-14 NOTE — ED Provider Notes (Signed)
[facility] EMERGENCY DEPARTMENT Provider Note   CSN: [id number] Arrival date & time: 11/14/17  1015     History   Chief Complaint Chief Complaint  Patient presents with  . Tingling    HPI Jennifer [name] is a 62 y.o. female.  HPI  The patient is a 62 year old female with a known history of coronary disease status post stenting and treatment with both Brilinta and clopidogrel, has never had a stroke but is being treated for diabetes hypertension and hypercholesterolemia.  She reports that last night a proximally 10:00 in the evening approximately 13 hours ago she developed acute onset of numbness of the left side of her body including her left face, arm and leg.  She had no difficulty with speech vision or walking and her coordination is been normal denying any weakness.  Symptoms are persistent, she went to sleep and woke up this morning with the same symptoms without any change.  They are not better or worse, there is no findings on the right side of the body, no difficulty with vision or speech this morning and she was able to get herself dressed and ambulate without difficulty.  She has never had a stroke.  Past Medical History:  Diagnosis Date  . CAD (coronary artery disease)    cath 5/23 100% dist RCA lesion treated with 2 overlapping Integrity Resolute DES ( 2.25x75mm, 2.25x45mm), 60% mid RCA treated medically, 99% OM2 not amenable to PCI, 70% D1 lesion, EF normal  . Diabetes mellitus without complication ([place])   . Hypercholesteremia   . Hypertension   . Thyroid disease     Patient Active Problem List   Diagnosis Date Noted  . Rash and nonspecific skin eruption 11/09/2017  . Morbid obesity ([place]) 11/09/2017  . Low back pain 10/26/2017  . Screening mammogram, encounter for 09/18/2017  . CAD (coronary artery disease) 11/04/2015  . NSTEMI (non-ST elevated myocardial infarction) ([place]) 11/03/2015  . Hypothyroidism 07/06/2010  . Hyperlipidemia LDL goal <100 08/28/2008  .  Essential hypertension 11/13/2007  . Diabetes type 2, controlled ([place]) 09/21/2006  . DIABETIC PERIPHERAL NEUROPATHY 09/21/2006  . Obesity (BMI 30-39.9) 09/21/2006  . TOBACCO ABUSE 09/21/2006  . Adjustment disorder with mixed anxiety and depressed mood 09/21/2006  . CARPAL TUNNEL SYNDROME 09/21/2006  . COPD (chronic obstructive pulmonary disease) ([place]) 09/21/2006  . EDEMA 09/21/2006  . MIGRAINES, HX OF 09/21/2006    Past Surgical History:  Procedure Laterality Date  . ABDOMINAL HYSTERECTOMY    . CARDIAC CATHETERIZATION N/A 11/03/2015   Procedure: Left Heart Cath and Coronary Angiography;  Surgeon: Leonie Man, MD;  Location: [facility];  Service: Cardiovascular;  Laterality: N/A;  . CARDIAC CATHETERIZATION N/A 11/03/2015   Procedure: Coronary Stent Intervention;  Surgeon: Leonie Man, MD;  Location: [facility];  Service: Cardiovascular;  Laterality: N/A;  . CARPAL TUNNEL RELEASE       OB History   None      Home Medications    Prior to Admission medications   Medication Sig Start Date End Date Taking? Authorizing Provider  aspirin EC 81 MG EC tablet Take 1 tablet (81 mg total) by mouth daily. 11/05/15  Yes Meng, Isaac Laud, PA  atorvastatin (LIPITOR) 80 MG tablet TAKE 1 TABLET BY MOUTH ONCE DAILY AT 6 IN THE EVENING 10/26/17  Yes Herminio Commons, MD  buPROPion (WELLBUTRIN SR) 150 MG 12 hr tablet Take 1 tablet (150 mg total) by mouth 2 (two) times daily. 09/18/17  Yes  [name], Marne A, MD  carvedilol (COREG) 12.5 MG tablet TAKE 1 TABLET BY MOUTH TWICE DAILY WITH MEALS 10/26/17  Yes Herminio Commons, MD  diclofenac (VOLTAREN) 75 MG EC tablet TAKE 1 TABLET BY MOUTH TWICE DAILY WITH A MEAL 10/26/17  Yes Sanjuana Kava, MD  DULoxetine (CYMBALTA) 60 MG capsule Take 1 capsule (60 mg total) by mouth daily. 09/18/17  Yes [name], [name], MD  furosemide (LASIX) 40 MG tablet Take 1 tablet (40 mg total) by mouth daily. 01/12/17  Yes Herminio Commons, MD  insulin lispro  (HUMALOG) 100 UNIT/ML injection Inject 40 Units into the skin daily as needed for high blood sugar.    Yes [provider]  levothyroxine (SYNTHROID, LEVOTHROID) 112 MCG tablet Take 112 mcg by mouth every evening.    Yes [provider]  lisinopril (PRINIVIL,ZESTRIL) 20 MG tablet TAKE 1 TABLET BY MOUTH ONCE DAILY 08/28/17  Yes Herminio Commons, MD  nitroGLYCERIN (NITROSTAT) 0.4 MG SL tablet Place 1 tablet (0.4 mg total) under the tongue every 5 (five) minutes x 3 doses as needed for chest pain. 11/05/15  Yes Almyra Deforest, PA  sitaGLIPtin (JANUVIA) 100 MG tablet Take 100 mg by mouth every evening.    Yes [provider]  ticagrelor (BRILINTA) 60 MG TABS tablet Take 1 tablet (60 mg total) by mouth 2 (two) times daily. 01/12/17  Yes Herminio Commons, MD    Family History Family History  Problem Relation Age of Onset  . Heart disease Mother   . Stroke Sister   . Heart attack Brother     Social History Social History   Tobacco Use  . Smoking status: Current Some Day Smoker    Packs/day: 0.25    Types: Cigarettes  . Smokeless tobacco: Never Used  Substance Use Topics  . Alcohol use: No    Alcohol/week: 0.0 oz  . Drug use: No     Allergies   Patient has no known allergies.   Review of Systems Review of Systems  All other systems reviewed and are negative.    Physical Exam Updated Vital Signs BP (!) 169/67   Pulse 67   Temp 97.9 F (36.6 C) (Oral)   Resp (!) 22   Ht 5\' 7"  (1.702 m)   Wt 109.8 kg (242 lb)   SpO2 99%   BMI 37.90 kg/m   Physical Exam  Constitutional: She appears well-developed and well-nourished. No distress.  HENT:  Head: Normocephalic and atraumatic.  Mouth/Throat: Oropharynx is clear and moist. No oropharyngeal exudate.  Eyes: Pupils are equal, round, and reactive to light. Conjunctivae and EOM are normal. Right eye exhibits no discharge. Left eye exhibits no discharge. No scleral icterus.  Neck: Normal range of motion.  Neck supple. No JVD present. No thyromegaly present.  Cardiovascular: Normal rate, regular rhythm, normal heart sounds and intact distal pulses. Exam reveals no gallop and no friction rub.  No murmur heard. Pulmonary/Chest: Effort normal and breath sounds normal. No respiratory distress. She has no wheezes. She has no rales.  Abdominal: Soft. Bowel sounds are normal. She exhibits no distension and no mass. There is no tenderness.  Musculoskeletal: Normal range of motion. She exhibits no edema or tenderness.  Lymphadenopathy:    She has no cervical adenopathy.  Neurological: She is alert. Coordination normal.  Speech is clear, cranial nerves III through XII are intact, memory is intact, strength is normal in all 4 extremities including grips, sensation is intact to light touch on the  right side of the body but on the left side it is not intact, she has decreased sensation to the left face arm and leg. .  Coordination as tested by finger-nose-finger is normal, no limb ataxia. Normal gait, normal reflexes at the patellar tendons bilaterally  Skin: Skin is warm and dry. No rash noted. No erythema.  Psychiatric: She has a normal mood and affect. Her behavior is normal.  Nursing note and vitals reviewed.    ED Treatments / Results  Labs (all labs ordered are listed, but only abnormal results are displayed) Labs Reviewed  COMPREHENSIVE METABOLIC PANEL - Abnormal; Notable for the following components:      Result Value   Glucose, Bld 214 (*)    Calcium 8.7 (*)    Total Protein 6.0 (*)    Albumin 2.9 (*)    All other components within normal limits  I-STAT CHEM 8, ED - Abnormal; Notable for the following components:   Potassium 3.4 (*)    Chloride 100 (*)    Glucose, Bld 201 (*)    All other components within normal limits  ETHANOL  CBC  DIFFERENTIAL  RAPID URINE DRUG SCREEN, HOSP PERFORMED  URINALYSIS, ROUTINE W REFLEX MICROSCOPIC  PROTIME-INR  APTT  I-STAT TROPONIN, ED    EKG EKG  Interpretation  Date/Time:  Tuesday November 14 2017 10:31:18 EDT Ventricular Rate:  67 PR Interval:    QRS Duration: 98 QT Interval:  458 QTC Calculation: 484 R Axis:   62 Text Interpretation:  Sinus rhythm Normal ECG since last tracing no significant change Confirmed by Noemi Chapel [id number]) on 11/14/2017 11:08:00 AM   Radiology Ct Head Wo Contrast  Result Date: 11/14/2017 CLINICAL DATA:  Paresthesia, blurred vision. EXAM: CT HEAD WITHOUT CONTRAST TECHNIQUE: Contiguous axial images were obtained from the base of the skull through the vertex without intravenous contrast. COMPARISON:  CT scan of September 27, 2004. FINDINGS: Brain: Mild chronic ischemic white matter disease is noted. Old lacunar infarction is noted in right basal ganglia. No mass effect or midline shift is noted. Ventricular size is within normal limits. There is no evidence of mass lesion, hemorrhage or acute infarction. Vascular: No hyperdense vessel or unexpected calcification. Skull: Normal. Negative for fracture or focal lesion. Sinuses/Orbits: No acute finding. Other: None. IMPRESSION: Mild chronic ischemic white matter disease. No acute intracranial abnormality seen. Electronically Signed   By: Marijo Conception, M.D.   On: 11/14/2017 12:14    Procedures Procedures (including critical care time)  Medications Ordered in ED Medications - No data to display   Initial Impression / Assessment and Plan / ED Course  I have reviewed the triage vital signs and the nursing notes.  Pertinent labs & imaging results that were available during my care of the patient were reviewed by me and considered in my medical decision making (see chart for details).    The patient's exam is consistent with a possible stroke, she does not have a headache or any focal weakness however she does have some numbness.  Will consult neurology and anticipate admission to the hospital for stroke work-up.  Neur recommends inpatient CT negative D/w Dr.  Carmelina Noun who will admit  Final Clinical Impressions(s) / ED Diagnoses   Final diagnoses:  Left sided numbness    ED Discharge Orders    None       Noemi Chapel, MD 11/14/17 1236

## 2017-11-15 ENCOUNTER — Observation Stay (HOSPITAL_BASED_OUTPATIENT_CLINIC_OR_DEPARTMENT_OTHER): Payer: 59

## 2017-11-15 DIAGNOSIS — R2990 Unspecified symptoms and signs involving the nervous system: Secondary | ICD-10-CM | POA: Diagnosis not present

## 2017-11-15 DIAGNOSIS — I10 Essential (primary) hypertension: Secondary | ICD-10-CM | POA: Diagnosis not present

## 2017-11-15 DIAGNOSIS — Z794 Long term (current) use of insulin: Secondary | ICD-10-CM | POA: Diagnosis not present

## 2017-11-15 DIAGNOSIS — R200 Anesthesia of skin: Secondary | ICD-10-CM

## 2017-11-15 DIAGNOSIS — E11319 Type 2 diabetes mellitus with unspecified diabetic retinopathy without macular edema: Secondary | ICD-10-CM

## 2017-11-15 DIAGNOSIS — R203 Hyperesthesia: Secondary | ICD-10-CM | POA: Diagnosis not present

## 2017-11-15 DIAGNOSIS — I2510 Atherosclerotic heart disease of native coronary artery without angina pectoris: Secondary | ICD-10-CM

## 2017-11-15 DIAGNOSIS — I361 Nonrheumatic tricuspid (valve) insufficiency: Secondary | ICD-10-CM

## 2017-11-15 DIAGNOSIS — H538 Other visual disturbances: Secondary | ICD-10-CM | POA: Diagnosis not present

## 2017-11-15 DIAGNOSIS — E039 Hypothyroidism, unspecified: Secondary | ICD-10-CM

## 2017-11-15 DIAGNOSIS — E785 Hyperlipidemia, unspecified: Secondary | ICD-10-CM

## 2017-11-15 DIAGNOSIS — I2583 Coronary atherosclerosis due to lipid rich plaque: Secondary | ICD-10-CM

## 2017-11-15 DIAGNOSIS — R270 Ataxia, unspecified: Secondary | ICD-10-CM | POA: Diagnosis not present

## 2017-11-15 DIAGNOSIS — I639 Cerebral infarction, unspecified: Secondary | ICD-10-CM | POA: Diagnosis not present

## 2017-11-15 LAB — GLUCOSE, CAPILLARY
GLUCOSE-CAPILLARY: 161 mg/dL — AB (ref 65–99)
GLUCOSE-CAPILLARY: 217 mg/dL — AB (ref 65–99)
Glucose-Capillary: 85 mg/dL (ref 65–99)
Glucose-Capillary: 124 mg/dL — ABNORMAL HIGH (ref 65–99)

## 2017-11-15 LAB — BASIC METABOLIC PANEL
Anion gap: 6 (ref 5–15)
BUN: 13 mg/dL (ref 6–20)
CALCIUM: 8.7 mg/dL — AB (ref 8.9–10.3)
CHLORIDE: 105 mmol/L (ref 101–111)
CO2: 32 mmol/L (ref 22–32)
CREATININE: 0.96 mg/dL (ref 0.44–1.00)
GFR calc Af Amer: >60 mL/min (ref >60)
GFR calc non Af Amer: >60 mL/min (ref >60)
Glucose, Bld: 78 mg/dL (ref 65–99)
Potassium: 4.1 mmol/L (ref 3.5–5.1)
Sodium: 143 mmol/L (ref 135–145)

## 2017-11-15 LAB — VITAMIN B12: Vitamin B-12: 347 pg/mL (ref 180–914)

## 2017-11-15 LAB — CBC
HCT: 40.8 % (ref 36.0–46.0)
HEMOGLOBIN: 12.8 g/dL (ref 12.0–15.0)
MCH: 27.3 pg (ref 26.0–34.0)
MCHC: 31.4 g/dL (ref 30.0–36.0)
MCV: 87 fL (ref 78.0–100.0)
PLATELETS: 225 10*3/uL (ref 150–400)
RBC: 4.69 MIL/uL (ref 3.87–5.11)
RDW: 14.1 % (ref 11.5–15.5)
WBC: 7 10*3/uL (ref 4.0–10.5)

## 2017-11-15 LAB — LIPID PANEL
CHOL/HDL RATIO: 4.5 ratio
CHOLESTEROL: 157 mg/dL (ref 0–200)
HDL: 35 mg/dL — AB (ref >40)
LDL Cholesterol: 95 mg/dL (ref 0–99)
TRIGLYCERIDES: 134 mg/dL (ref <150)
VLDL: 27 mg/dL (ref 0–40)

## 2017-11-15 LAB — HIV ANTIBODY (ROUTINE TESTING W REFLEX): HIV Screen 4th Generation wRfx: NONREACTIVE

## 2017-11-15 LAB — ECHOCARDIOGRAM COMPLETE
Height: 67 in
Weight: 3872 oz

## 2017-11-15 MED ORDER — IPRATROPIUM-ALBUTEROL 0.5-2.5 (3) MG/3ML IN SOLN
3.0000 mL | Freq: Four times a day (QID) | RESPIRATORY_TRACT | Status: DC | PRN
Start: 2017-11-15 — End: 2017-11-16

## 2017-11-15 MED ORDER — IPRATROPIUM-ALBUTEROL 0.5-2.5 (3) MG/3ML IN SOLN
3.0000 mL | RESPIRATORY_TRACT | Status: DC
  Administered 2017-11-15 (×2): 3 mL via RESPIRATORY_TRACT
  Filled 2017-11-15: qty 3

## 2017-11-15 MED ORDER — ASPIRIN EC 81 MG PO TBEC
162.0000 mg | DELAYED_RELEASE_TABLET | Freq: Every day | ORAL | Status: DC
  Administered 2017-11-16: 162 mg via ORAL
  Filled 2017-11-15: qty 2

## 2017-11-15 MED ORDER — IPRATROPIUM-ALBUTEROL 0.5-2.5 (3) MG/3ML IN SOLN
3.0000 mL | Freq: Three times a day (TID) | RESPIRATORY_TRACT | Status: DC
  Administered 2017-11-15: 3 mL via RESPIRATORY_TRACT
  Filled 2017-11-15: qty 3

## 2017-11-15 NOTE — Consult Note (Addendum)
Jennifer A. Merlene Laughter, MD     [email or web address]          Jennifer [name] is an 63 y.o. female.   ASSESSMENT/PLAN: 1.  Acute left hemisensory impairment due to lacunar infarction: Symptomatic infarct is likely the right thalamic infarct.  The right posterior frontal/parietal infarct is most likely an symptomatic.  The presentation of 2 relatively simultaneous infarcts in different distribution raises the possibility of embolic phenomena but these are small vessel lacunar type infarcts which make embolic phenomena unlikely.  Risk factors age, diabetes, hypertension, dyslipidemia and coronary artery disease.  I recommend that the patient aspirin be increased to 162 mg.  Agree with the use of statin, blood pressure and diabetes control.  We likely should hold off on elective surgery for a month.   2.  Multiple asymptomatic remote lacunar infarcts:   The she reports some visual changes involving the left eye.  She patient is a 62 year old right-handed white female who presented with acute onset of numbness involving primarily the left upper extremity but also the left leg.  Does have apparent bleeding in the retina on the left side along with cataracts.  She has had some that she has bilateral cataracts as mentioned needs to be removed.  She actually scheduled for surgery this week.  The patient has had some ongoing blurring of vision on the left side.  This started before her current symptoms of left-sided numbness.  She does not reports any worsening of the numbness.  Her balance has been pretty steady an has not been affected with her current event.  She denies any dysarthria.  She denies any palpitations or chest pain with her current event.  She does not report having focal weakness.  Review of systems is otherwise negative.   GENERAL: This a pleasant overweight female who is in no acute distress.  HEENT: No trauma appreciated.  Neck is supple.  ABDOMEN: soft  EXTREMITIES:  No edema   BACK: Normal  SKIN: Normal by inspection.    MENTAL STATUS: Alert and oriented -including orientation to the month and her age. Speech, language and cognition are generally intact. Judgment and insight normal.   CRANIAL NERVES: Pupils R 6 L 5 both  are  reactive to light and accomodation; extra ocular movements are full, there is no significant nystagmus; visual fields are full; upper and lower facial muscles are normal in strength and symmetric, there is no flattening of the nasolabial folds; tongue is midline; uvula is midline; shoulder elevation is normal.  MOTOR: Normal tone, bulk and strength; no pronator drift.  There is no drift of the upper and lower extremities.  COORDINATION: Left finger to nose is normal, right finger to nose is normal, No rest tremor; no intention tremor; no postural tremor; no bradykinesia.  REFLEXES: Deep tendon reflexes are symmetrical and normal in the upper extremities but diminished 1+ in the legs plantar reflexes are flexor bilaterally.   SENSATION: This is reduced to temperature light touch involving mostly the left hand.  The left forearm and arm are normal.  The other extremities are normal.  She does extinguish to double simultaneous stimulation of the left upper extremity.     NIH stroke scale 2.     Blood pressure (!) 177/76, pulse 80, temperature 98.3 F (36.8 C), temperature source Oral, resp. rate 19, height 5' 7" (1.702 m), weight 242 lb (109.8 kg), SpO2 96 %.  Past Medical History:  Diagnosis Date  . CAD (coronary artery  disease)    cath 5/23 100% dist RCA lesion treated with 2 overlapping Integrity Resolute DES ( 2.25x9m, 2.25x135m, 60% mid RCA treated medically, 99% OM2 not amenable to PCI, 70% D1 lesion, EF normal  . Diabetes mellitus without complication ([facility]  . Hypercholesteremia   . Hypertension   . Thyroid disease     Past Surgical History:  Procedure Laterality Date  . ABDOMINAL HYSTERECTOMY    . CARDIAC  CATHETERIZATION N/A 11/03/2015   Procedure: Left Heart Cath and Coronary Angiography;  Surgeon: DaLeonie ManMD;  [facility];  Service: Cardiovascular;  Laterality: N/A;  . CARDIAC CATHETERIZATION N/A 11/03/2015   Procedure: Coronary Stent Intervention;  Surgeon: DaLeonie ManMD;  [facility];  Service: Cardiovascular;  Laterality: N/A;  . CARPAL TUNNEL RELEASE      Family History  Problem Relation Age of Onset  . Heart disease Mother   . Stroke Sister   . Heart attack Brother     Social History:  reports that she has been smoking cigarettes.  She has been smoking about 0.25 packs per day. She has never used smokeless tobacco. She reports that she does not drink alcohol or use drugs.  Allergies: No Known Allergies  Medications: Prior to Admission medications   Medication Sig Start Date End Date Taking? Authorizing Provider  aspirin EC 81 MG EC tablet Take 1 tablet (81 mg total) by mouth daily. 11/05/15  Yes Meng, HaIsaac LaudPA  atorvastatin (LIPITOR) 80 MG tablet TAKE 1 TABLET BY MOUTH ONCE DAILY AT 6 IN THE EVENING 10/26/17  Yes KoHerminio CommonsMD  buPROPion (WELLBUTRIN SR) 150 MG 12 hr tablet Take 1 tablet (150 mg total) by mouth 2 (two) times daily. 09/18/17  Yes [name], MaWynelle FannyMD  carvedilol (COREG) 12.5 MG tablet TAKE 1 TABLET BY MOUTH TWICE DAILY WITH MEALS 10/26/17  Yes KoHerminio CommonsMD  diclofenac (VOLTAREN) 75 MG EC tablet TAKE 1 TABLET BY MOUTH TWICE DAILY WITH A MEAL 10/26/17  Yes KeSanjuana KavaMD  DULoxetine (CYMBALTA) 60 MG capsule Take 1 capsule (60 mg total) by mouth daily. 09/18/17  Yes [name], MaWynelle FannyMD  furosemide (LASIX) 40 MG tablet Take 1 tablet (40 mg total) by mouth daily. 01/12/17  Yes KoHerminio CommonsMD  insulin lispro (HUMALOG) 100 UNIT/ML injection Inject 40 Units into the skin daily as needed for high blood sugar.    Yes [provider]  levothyroxine (SYNTHROID, LEVOTHROID) 112 MCG tablet Take 112 mcg  by mouth every evening.    Yes [provider]  lisinopril (PRINIVIL,ZESTRIL) 20 MG tablet TAKE 1 TABLET BY MOUTH ONCE DAILY 08/28/17  Yes KoHerminio CommonsMD  nitroGLYCERIN (NITROSTAT) 0.4 MG SL tablet Place 1 tablet (0.4 mg total) under the tongue every 5 (five) minutes x 3 doses as needed for chest pain. 11/05/15  Yes MeAlmyra DeforestPA  sitaGLIPtin (JANUVIA) 100 MG tablet Take 100 mg by mouth every evening.    Yes [provider]  ticagrelor (BRILINTA) 60 MG TABS tablet Take 1 tablet (60 mg total) by mouth 2 (two) times daily. 01/12/17  Yes KoHerminio CommonsMD    Scheduled Meds: . aspirin EC  81 mg Oral Daily  . atorvastatin  80 mg Oral q1800  . buPROPion  150 mg Oral BID  . carvedilol  12.5 mg Oral BID WC  . DULoxetine  60 mg Oral Daily  . furosemide  40 mg Oral  Daily  . heparin  5,000 Units Subcutaneous Q8H  . insulin aspart  0-15 Units Subcutaneous TID WC  . insulin detemir  5 Units Subcutaneous QHS  . ipratropium-albuterol  3 mL Nebulization TID  . levothyroxine  112 mcg Oral QAC supper  . lisinopril  20 mg Oral Daily  . ticagrelor  60 mg Oral BID   Continuous Infusions: . sodium chloride 50 mL/hr at 11/15/17 1754   PRN Meds:.acetaminophen **OR** acetaminophen (TYLENOL) oral liquid 160 mg/5 mL **OR** acetaminophen, senna-docusate     Results for orders placed or performed during the hospital encounter of 11/14/17 (from the past 48 hour(s))  Ethanol     Status: None   Collection Time: 11/14/17 11:09 AM  Result Value Ref Range   Alcohol, Ethyl (B) <10 <10 mg/dL    Comment: (NOTE) Lowest detectable limit for serum alcohol is 10 mg/dL. For medical purposes only. Performed at [facility], [address], [place], [place] [address]   CBC     Status: None   Collection Time: 11/14/17 11:09 AM  Result Value Ref Range   WBC 7.4 4.0 - 10.5 K/uL   RBC 4.73 3.87 - 5.11 MIL/uL   Hemoglobin 12.9 12.0 - 15.0 g/dL   HCT 41.0 36.0 - 46.0 %   MCV 86.7 78.0 -  100.0 fL   MCH 27.3 26.0 - 34.0 pg   MCHC 31.5 30.0 - 36.0 g/dL   RDW 13.7 11.5 - 15.5 %   Platelets 243 150 - 400 K/uL    Comment: Performed at [facility], [address] [place], [place] [address]  Differential     Status: None   Collection Time: 11/14/17 11:09 AM  Result Value Ref Range   Neutrophils Relative % 74 %   Neutro Abs 5.4 1.7 - 7.7 K/uL   Lymphocytes Relative 17 %   Lymphs Abs 1.3 0.7 - 4.0 K/uL   Monocytes Relative 7 %   Monocytes Absolute 0.5 0.1 - 1.0 K/uL   Eosinophils Relative 2 %   Eosinophils Absolute 0.1 0.0 - 0.7 K/uL   Basophils Relative 0 %   Basophils Absolute 0.0 0.0 - 0.1 K/uL    Comment: Performed at [facility], [address], [place], [place] [address]  Comprehensive metabolic panel     Status: Abnormal   Collection Time: 11/14/17 11:09 AM  Result Value Ref Range   Sodium 140 135 - 145 mmol/L   Potassium 3.6 3.5 - 5.1 mmol/L   Chloride 102 101 - 111 mmol/L   CO2 31 22 - 32 mmol/L   Glucose, Bld 214 (H) 65 - 99 mg/dL   BUN 14 6 - 20 mg/dL   Creatinine, Ser 0.80 0.44 - 1.00 mg/dL   Calcium 8.7 (L) 8.9 - 10.3 mg/dL   Total Protein 6.0 (L) 6.5 - 8.1 g/dL   Albumin 2.9 (L) 3.5 - 5.0 g/dL   AST 20 15 - 41 U/L   ALT 21 14 - 54 U/L   Alkaline Phosphatase 69 38 - 126 U/L   Total Bilirubin 0.6 0.3 - 1.2 mg/dL   GFR calc non Af Amer >60 >60 mL/min   GFR calc Af Amer >60 >60 mL/min    Comment: (NOTE) The eGFR has been calculated using the CKD EPI equation. This calculation has not been validated in all clinical situations. eGFR's persistently <60 mL/min signify possible Chronic Kidney Disease.    Anion gap 7 5 - 15    Comment: Performed at [facility],  [address], [place], [place] [address]  Urine rapid drug screen (hosp performed)     Status: None   Collection Time: 11/14/17 11:09 AM  Result Value Ref Range   Opiates NONE DETECTED NONE DETECTED   Cocaine NONE DETECTED NONE DETECTED   Benzodiazepines NONE DETECTED NONE DETECTED    Amphetamines NONE DETECTED NONE DETECTED   Tetrahydrocannabinol NONE DETECTED NONE DETECTED   Barbiturates NONE DETECTED NONE DETECTED    Comment: (NOTE) DRUG SCREEN FOR MEDICAL PURPOSES ONLY.  IF CONFIRMATION IS NEEDED FOR ANY PURPOSE, NOTIFY LAB WITHIN 5 DAYS. LOWEST DETECTABLE LIMITS FOR URINE DRUG SCREEN Drug Class                     Cutoff (ng/mL) Amphetamine and metabolites    1000 Barbiturate and metabolites    200 Benzodiazepine                 546 Tricyclics and metabolites     300 Opiates and metabolites        300 Cocaine and metabolites        300 THC                            50 Performed at [facility], [address], [place], [place] [address]   Urinalysis, Routine w reflex microscopic     Status: Abnormal   Collection Time: 11/14/17 11:09 AM  Result Value Ref Range   Color, Urine STRAW (A) YELLOW   APPearance CLEAR CLEAR   Specific Gravity, Urine 1.005 1.005 - 1.030   pH 6.0 5.0 - 8.0   Glucose, UA NEGATIVE NEGATIVE mg/dL   Hgb urine dipstick NEGATIVE NEGATIVE   Bilirubin Urine NEGATIVE NEGATIVE   Ketones, ur NEGATIVE NEGATIVE mg/dL   Protein, ur 100 (A) NEGATIVE mg/dL   Nitrite NEGATIVE NEGATIVE   Leukocytes, UA NEGATIVE NEGATIVE   RBC / HPF 0-5 0 - 5 RBC/hpf   WBC, UA 0-5 0 - 5 WBC/hpf   Bacteria, UA RARE (A) NONE SEEN   Squamous Epithelial / LPF 0-5 0 - 5   Mucus PRESENT    Hyaline Casts, UA PRESENT     Comment: Performed at [facility], [address] [place], [place] [address]  TSH     Status: None   Collection Time: 11/14/17 11:09 AM  Result Value Ref Range   TSH 1.837 0.350 - 4.500 uIU/mL    Comment: Performed by a 3rd Generation assay with a functional sensitivity of <=0.01 uIU/mL. Performed at [facility], [address], [place], [place] [address]   Hemoglobin A1c     Status: Abnormal   Collection Time: 11/14/17 11:09 AM  Result Value Ref Range   Hgb A1c MFr Bld 7.1 (H) 4.8 - 5.6 %    Comment: (NOTE) Pre diabetes:           5.7%-6.4% Diabetes:              >6.4% Glycemic control for   <7.0% adults with diabetes    Mean Plasma Glucose 157.07 mg/dL    Comment: Performed at [facility] [address] [place], [place] [address]  I-stat troponin, ED     Status: None   Collection Time: 11/14/17 11:22 AM  Result Value Ref Range   Troponin i, poc 0.00 0.00 - 0.08 ng/mL   Comment 3            Comment: Due  to the release kinetics of cTnI, a negative result within the first hours of the onset of symptoms does not rule out myocardial infarction with certainty. If myocardial infarction is still suspected, repeat the test at appropriate intervals.   I-Stat Chem 8, ED     Status: Abnormal   Collection Time: 11/14/17 11:24 AM  Result Value Ref Range   Sodium 141 135 - 145 mmol/L   Potassium 3.4 (L) 3.5 - 5.1 mmol/L   Chloride 100 (L) 101 - 111 mmol/L   BUN 13 6 - 20 mg/dL   Creatinine, Ser 0.80 0.44 - 1.00 mg/dL   Glucose, Bld 201 (H) 65 - 99 mg/dL   Calcium, Ion 1.17 1.15 - 1.40 mmol/L   TCO2 29 22 - 32 mmol/L   Hemoglobin 13.3 12.0 - 15.0 g/dL   HCT 39.0 36.0 - 46.0 %  Protime-INR     Status: None   Collection Time: 11/14/17 12:19 PM  Result Value Ref Range   Prothrombin Time 13.0 11.4 - 15.2 seconds   INR 0.99     Comment: Performed at [facility], [address], [place], [place] [address]  APTT     Status: None   Collection Time: 11/14/17 12:19 PM  Result Value Ref Range   aPTT 30 24 - 36 seconds    Comment: Performed at [facility], [address] [place], [place] [address]  Magnesium     Status: None   Collection Time: 11/14/17 12:38 PM  Result Value Ref Range   Magnesium 1.8 1.7 - 2.4 mg/dL    Comment: Performed at [facility], [address] [place], [place] [address]  Phosphorus     Status: None   Collection Time: 11/14/17 12:38 PM  Result Value Ref Range   Phosphorus 4.4 2.5 - 4.6 mg/dL    Comment: Performed at [facility], [address], [place], [place] [address]  HIV  antibody (Routine Testing)     Status: None   Collection Time: 11/14/17 12:38 PM  Result Value Ref Range   HIV Screen 4th Generation wRfx Non Reactive Non Reactive    Comment: (NOTE) Performed At: [facility] [place], [place] [id number] Rush Farmer MD [id number] Performed at [facility], [address], [place], [place] [address]   CBG monitoring, ED     Status: Abnormal   Collection Time: 11/14/17  2:22 PM  Result Value Ref Range   Glucose-Capillary 121 (H) 65 - 99 mg/dL  Glucose, capillary     Status: Abnormal   Collection Time: 11/14/17  9:16 PM  Result Value Ref Range   Glucose-Capillary 112 (H) 65 - 99 mg/dL   Comment 1 Notify RN    Comment 2 Document in Chart   Lipid panel     Status: Abnormal   Collection Time: 11/15/17  5:18 AM  Result Value Ref Range   Cholesterol 157 0 - 200 mg/dL   Triglycerides 134 <150 mg/dL   HDL 35 (L) >40 mg/dL   Total CHOL/HDL Ratio 4.5 RATIO   VLDL 27 0 - 40 mg/dL   LDL Cholesterol 95 0 - 99 mg/dL    Comment:        Total Cholesterol/HDL:CHD Risk Coronary Heart Disease Risk Table                     Men   Women  1/2 Average Risk   3.4   3.3  Average Risk  5.0   4.4  2 X Average Risk   9.6   7.1  3 X Average Risk  23.4   11.0        Use the calculated Patient Ratio above and the CHD Risk Table to determine the patient's CHD Risk.        ATP III CLASSIFICATION (LDL):  <100     mg/dL   Optimal  100-129  mg/dL   Near or Above                    Optimal  130-159  mg/dL   Borderline  160-189  mg/dL   High  >190     mg/dL   Very High Performed at [facility], [address], [place], [place] [address]   CBC     Status: None   Collection Time: 11/15/17  5:18 AM  Result Value Ref Range   WBC 7.0 4.0 - 10.5 K/uL   RBC 4.69 3.87 - 5.11 MIL/uL   Hemoglobin 12.8 12.0 - 15.0 g/dL   HCT 40.8 36.0 - 46.0 %   MCV 87.0 78.0 - 100.0 fL   MCH 27.3 26.0 - 34.0 pg   MCHC 31.4 30.0 - 36.0 g/dL   RDW 14.1 11.5  - 15.5 %   Platelets 225 150 - 400 K/uL    Comment: Performed at [facility], [address] [place], [place] [address]  Basic metabolic panel     Status: Abnormal   Collection Time: 11/15/17  5:18 AM  Result Value Ref Range   Sodium 143 135 - 145 mmol/L   Potassium 4.1 3.5 - 5.1 mmol/L    Comment: DELTA CHECK NOTED   Chloride 105 101 - 111 mmol/L   CO2 32 22 - 32 mmol/L   Glucose, Bld 78 65 - 99 mg/dL   BUN 13 6 - 20 mg/dL   Creatinine, Ser 0.96 0.44 - 1.00 mg/dL   Calcium 8.7 (L) 8.9 - 10.3 mg/dL   GFR calc non Af Amer >60 >60 mL/min   GFR calc Af Amer >60 >60 mL/min    Comment: (NOTE) The eGFR has been calculated using the CKD EPI equation. This calculation has not been validated in all clinical situations. eGFR's persistently <60 mL/min signify possible Chronic Kidney Disease.    Anion gap 6 5 - 15    Comment: Performed at [facility], [address] [place], [place] [address]  Glucose, capillary     Status: None   Collection Time: 11/15/17  8:00 AM  Result Value Ref Range   Glucose-Capillary 85 65 - 99 mg/dL   Comment 1 Notify RN   Glucose, capillary     Status: Abnormal   Collection Time: 11/15/17 11:56 AM  Result Value Ref Range   Glucose-Capillary 124 (H) 65 - 99 mg/dL   Comment 1 Notify RN   Glucose, capillary     Status: Abnormal   Collection Time: 11/15/17  4:58 PM  Result Value Ref Range   Glucose-Capillary 161 (H) 65 - 99 mg/dL    Studies/Results:  CAROTID DOPPLERS IMPRESSION: 1. Mild (1-49%) stenosis proximal right internal carotid artery secondary to heterogenous atherosclerotic plaque. 2. Mild (1-49%) stenosis proximal left internal carotid artery secondary to heterogenous atherosclerotic plaque. 3. Vertebral arteries are patent with normal antegrade flow.     ECHO - Left ventricle: The cavity size was normal. Wall thickness was   increased in a pattern of mild LVH. Systolic function was normal.  The estimated ejection fraction was in the  range of 55% to 60%.   Doppler parameters are consistent with abnormal left ventricular   relaxation (grade 1 diastolic dysfunction). Doppler parameters   are consistent with high ventricular filling pressure. - Regional wall motion abnormality: Mild hypokinesis of the basal   inferoseptal and basal inferior myocardium. - Tricuspid valve: There was mild regurgitation.      BRAIN MRI - MRA FINDINGS: MRI HEAD FINDINGS  Brain: Subcentimeter foci of restricted diffusion in the right thalamus and right juxtacortical frontal parietal region. There is extensive FLAIR hyperintensity in the pons, progressed from 2006. Perforator infarct has occurred in the right lateral lenticulostriate distribution. Remote lacunar infarct in the left frontal white matter. Two small remote left cerebellar infarcts. Moderate supratentorial chronic ischemic gliosis, progressed.  No hemorrhage, hydrocephalus, collection, or atrophy.  Vascular: Major flow voids are preserved.  Skull and upper cervical spine: No evidence of marrow lesion  Sinuses/Orbits: Negative  MRA HEAD FINDINGS  Symmetric carotid arteries. Mild right vertebral artery dominance. Dominant left PICA. Codominant right PICA and AICA. Carotid, vertebral, basilar, and medium size branches are widely patent. Mild narrowing of the inferior division right M2 branch. Negative for aneurysm.  IMPRESSION: 1. Acute lacunar infarcts in the right thalamus and right frontal parietal white matter. 2. Chronic small vessel ischemia including remote small vessel infarcts that have progressed since 2006. 3. Negative intracranial MRA.        The brain MRI and MRA both reviewed in person.  There is a tiny increased signal involving the right thalamic nucleus and the posterior frontal lobe on the right side.  These are consistent with a subacute/acute ischemia/lacunar infarcts.  There is chronic encephalomalacia involving the inferior  cerebellar regions bilaterally more on the left side.  There is similar findings involving the centrum semiovale on the right and the left juxtacortical frontal region.  These are all consistent with chronic lacunar type infarcts.  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  No hemorrhage is appreciated.  MRA shows no occlusive disease.    Dewight Catino A. Merlene [name], M.D.  Diplomate, Tax adviser of Psychiatry and Neurology ( Neurology). 11/15/2017, 7:17 PM

## 2017-11-15 NOTE — Progress Notes (Signed)
*  PRELIMINARY RESULTS* Echocardiogram 2D Echocardiogram has been performed.  Leavy Cella 11/15/2017, 11:39 AM

## 2017-11-15 NOTE — Progress Notes (Signed)
OT Cancellation Note  Patient Details Name: Jennifer [name] MRN: [id number] DOB: March 21, 1956   Cancelled Treatment:    Reason Eval/Treat Not Completed: OT screened, no needs identified, will sign off. Pt screened for OT needs. Pt with tingling in LUE and LLE, light touch sensation is intact. Pt demonstrates BUE strength WFL at 4+/5, coordination is intact. Pt is performing ADLs at baseline independence, no further OT services required at this time.    Guadelupe Sabin, OTR/L  [phone] 11/15/2017, 7:54 AM

## 2017-11-15 NOTE — Progress Notes (Addendum)
PROGRESS NOTE    Jennifer [name]  TOI:[id number]  DOB: January 06, 1956  DOA: 11/14/2017 PCP: Abner Greenspan, MD   Brief Admission Hx: Jennifer [name] is a 62 y.o. female with past medical history significant for coronary artery disease status post PCI, diabetes mellitus, type II obesity, hypertension, hypothyroidism and hypercholesterolemia; who presented to the emergency department secondary to acute blurred vision, left-sided paresthesia and numbness.  Patient was last seen normal on 11/13/2017.  Patient reports sudden onset of the symptoms for what she thought were associated with her eye cataracts.  Given the numbness and tingling sensation on the left side of her body, she decided to come to the emergency department for further evaluation.  MDM/Assessment & Plan:   1. Acute lacunar infarcts - Pt admitted for further management, continue stroke work up.  Obtain neurology consult, continue aspirin and brilinta for now. Follow A1c, TSH, lipid panel, PT/OT eval, SLP eval pending, allow permissive hypertension. Echocardiogram pending.  Dopplers pending.   2. Type 2 DM - hold home oral medications, continue sliding scale and basal coverage.  3. Hyperlipidemia - check lipid panel, continue statins and adjust as needed.  4. Essential hypertension - permissive hypertension 5. Hypothyroidism - check TSH, continue home synthroid.  6. CAD - stable, follow.  7. Hypokalemia - repleted.  8. Tobacco - ongoing tobacco cessation counseling.  9. COPD - pt has a lot of wheezing, will schedule duoneb treatments.   DVT prophylaxis: heparin Code Status: Full  Family Communication: daughter Disposition Plan: Home in 1-2 days    Consultants:  neurology  Subjective: Pt without complaints today.   Objective: Vitals:   11/15/17 0930 11/15/17 0945 11/15/17 1330 11/15/17 1517  BP: (!) 158/99  (!) 154/79   Pulse: 80  72   Resp: 18  19   Temp: 98.4 F (36.9 C)  98.5 F (36.9 C)   TempSrc: Oral  Oral     SpO2: 99% 97% 97% 96%  Weight:      Height:        Intake/Output Summary (Last 24 hours) at 11/15/2017 1608 Last data filed at 11/15/2017 1215 Gross per 24 hour  Intake 1339.17 ml  Output 1100 ml  Net 239.17 ml   Filed Weights   11/14/17 1026 11/14/17 1730  Weight: 109.8 kg (242 lb) 109.8 kg (242 lb)     REVIEW OF SYSTEMS  As per history otherwise all reviewed and reported negative  Exam:  General exam: awake, alert, NAD, cooperative.  Respiratory system: bilateral diffuse wheezing No increased work of breathing. Cardiovascular system: S1 & S2 heard. No JVD, murmurs, gallops, clicks or pedal edema. Gastrointestinal system: Abdomen is nondistended, soft and nontender. Normal bowel sounds heard. Central nervous system: Alert and oriented. No focal neurological deficits. Extremities: .No CCE.  Data Reviewed: Basic Metabolic Panel: Recent Labs  Lab 11/14/17 1109 11/14/17 1124 11/14/17 1238 11/15/17 0518  NA 140 141  --  143  K 3.6 3.4*  --  4.1  CL 102 100*  --  105  CO2 31  --   --  32  GLUCOSE 214* 201*  --  78  BUN 14 13  --  13  CREATININE 0.80 0.80  --  0.96  CALCIUM 8.7*  --   --  8.7*  MG  --   --  1.8  --   PHOS  --   --  4.4  --    Liver Function Tests: Recent Labs  Lab 11/14/17 1109  AST 20  ALT 21  ALKPHOS 69  BILITOT 0.6  PROT 6.0*  ALBUMIN 2.9*   No results for input(s): LIPASE, AMYLASE in the last 168 hours. No results for input(s): AMMONIA in the last 168 hours. CBC: Recent Labs  Lab 11/14/17 1109 11/14/17 1124 11/15/17 0518  WBC 7.4  --  7.0  NEUTROABS 5.4  --   --   HGB 12.9 13.3 12.8  HCT 41.0 39.0 40.8  MCV 86.7  --  87.0  PLT 243  --  225   Cardiac Enzymes: No results for input(s): CKTOTAL, CKMB, CKMBINDEX, TROPONINI in the last 168 hours. CBG (last 3)  Recent Labs    11/14/17 2116 11/15/17 0800 11/15/17 1156  GLUCAP 112* 85 124*   No results found for this or any previous visit (from the past 240 hour(s)).    Studies: Ct Head Wo Contrast  Result Date: 11/14/2017 CLINICAL DATA:  Paresthesia, blurred vision. EXAM: CT HEAD WITHOUT CONTRAST TECHNIQUE: Contiguous axial images were obtained from the base of the skull through the vertex without intravenous contrast. COMPARISON:  CT scan of September 27, 2004. FINDINGS: Brain: Mild chronic ischemic white matter disease is noted. Old lacunar infarction is noted in right basal ganglia. No mass effect or midline shift is noted. Ventricular size is within normal limits. There is no evidence of mass lesion, hemorrhage or acute infarction. Vascular: No hyperdense vessel or unexpected calcification. Skull: Normal. Negative for fracture or focal lesion. Sinuses/Orbits: No acute finding. Other: None. IMPRESSION: Mild chronic ischemic white matter disease. No acute intracranial abnormality seen. Electronically Signed   By: Marijo Conception, M.D.   On: 11/14/2017 12:14   Mr Brain Wo Contrast  Result Date: 11/14/2017 CLINICAL DATA:  Left-sided tingling for 12 hours. Initial encounter. EXAM: MRI HEAD WITHOUT CONTRAST MRA HEAD WITHOUT CONTRAST TECHNIQUE: Multiplanar, multiecho pulse sequences of the brain and surrounding structures were obtained without intravenous contrast. Angiographic images of the head were obtained using MRA technique without contrast. COMPARISON:  Head CT from earlier today.  Brain MRI 10/11/2004 FINDINGS: MRI HEAD FINDINGS Brain: Subcentimeter foci of restricted diffusion in the right thalamus and right juxtacortical frontal parietal region. There is extensive FLAIR hyperintensity in the pons, progressed from 2006. Perforator infarct has occurred in the right lateral lenticulostriate distribution. Remote lacunar infarct in the left frontal white matter. Two small remote left cerebellar infarcts. Moderate supratentorial chronic ischemic gliosis, progressed. No hemorrhage, hydrocephalus, collection, or atrophy. Vascular: Major flow voids are preserved. Skull and  upper cervical spine: No evidence of marrow lesion Sinuses/Orbits: Negative MRA HEAD FINDINGS Symmetric carotid arteries. Mild right vertebral artery dominance. Dominant left PICA. Codominant right PICA and AICA. Carotid, vertebral, basilar, and medium size branches are widely patent. Mild narrowing of the inferior division right M2 branch. Negative for aneurysm. IMPRESSION: 1. Acute lacunar infarcts in the right thalamus and right frontal parietal white matter. 2. Chronic small vessel ischemia including remote small vessel infarcts that have progressed since 2006. 3. Negative intracranial MRA. Electronically Signed   By: Monte Fantasia M.D.   On: 11/14/2017 13:40   US Carotid Bilateral (at [facility] And [facility] Only)  Result Date: 11/14/2017 CLINICAL DATA:  62 year old female with stroke-like symptoms and acute lacunar infarcts in the right thalamus and right frontal parietal white matter. EXAM: BILATERAL CAROTID DUPLEX ULTRASOUND TECHNIQUE: Pearline Cables scale imaging, color Doppler and duplex ultrasound were performed of bilateral carotid and vertebral arteries in the neck. COMPARISON:  Brain MRI 11/14/2017 FINDINGS: Criteria: Quantification of carotid  stenosis is based on velocity parameters that correlate the residual internal carotid diameter with NASCET-based stenosis levels, using the diameter of the distal internal carotid lumen as the denominator for stenosis measurement. The following velocity measurements were obtained: RIGHT ICA: 124/32 cm/sec CCA: 54/0 cm/sec SYSTOLIC ICA/CCA RATIO:  2.1 ECA:  89 cm/sec LEFT ICA: 70/19 cm/sec CCA: 08/67 cm/sec SYSTOLIC ICA/CCA RATIO:  1.3 ECA:  74 cm/sec RIGHT CAROTID ARTERY: Trace heterogeneous atherosclerotic plaque in the proximal internal carotid artery. By peak systolic velocity criteria, the estimated stenosis remains less than 50%. RIGHT VERTEBRAL ARTERY:  Patent with normal antegrade flow. LEFT CAROTID ARTERY: Trace heterogeneous atherosclerotic plaque in the proximal  internal carotid artery. By peak systolic velocity criteria, the estimated stenosis remains less than 50%. LEFT VERTEBRAL ARTERY:  Patent with normal antegrade flow. IMPRESSION: 1. Mild (1-49%) stenosis proximal right internal carotid artery secondary to heterogenous atherosclerotic plaque. 2. Mild (1-49%) stenosis proximal left internal carotid artery secondary to heterogenous atherosclerotic plaque. 3. Vertebral arteries are patent with normal antegrade flow. Signed, Criselda Peaches, MD [facility] Electronically Signed   By: Jacqulynn Cadet M.D.   On: 11/14/2017 14:30   Mr Jodene Nam Head/brain YP Cm  Result Date: 11/14/2017 CLINICAL DATA:  Left-sided tingling for 12 hours. Initial encounter. EXAM: MRI HEAD WITHOUT CONTRAST MRA HEAD WITHOUT CONTRAST TECHNIQUE: Multiplanar, multiecho pulse sequences of the brain and surrounding structures were obtained without intravenous contrast. Angiographic images of the head were obtained using MRA technique without contrast. COMPARISON:  Head CT from earlier today.  Brain MRI 10/11/2004 FINDINGS: MRI HEAD FINDINGS Brain: Subcentimeter foci of restricted diffusion in the right thalamus and right juxtacortical frontal parietal region. There is extensive FLAIR hyperintensity in the pons, progressed from 2006. Perforator infarct has occurred in the right lateral lenticulostriate distribution. Remote lacunar infarct in the left frontal white matter. Two small remote left cerebellar infarcts. Moderate supratentorial chronic ischemic gliosis, progressed. No hemorrhage, hydrocephalus, collection, or atrophy. Vascular: Major flow voids are preserved. Skull and upper cervical spine: No evidence of marrow lesion Sinuses/Orbits: Negative MRA HEAD FINDINGS Symmetric carotid arteries. Mild right vertebral artery dominance. Dominant left PICA. Codominant right PICA and AICA. Carotid, vertebral, basilar, and medium size branches  are widely patent. Mild narrowing of the inferior division right M2 branch. Negative for aneurysm. IMPRESSION: 1. Acute lacunar infarcts in the right thalamus and right frontal parietal white matter. 2. Chronic small vessel ischemia including remote small vessel infarcts that have progressed since 2006. 3. Negative intracranial MRA. Electronically Signed   By: Monte Fantasia M.D.   On: 11/14/2017 13:40     Scheduled Meds: . aspirin EC  81 mg Oral Daily  . atorvastatin  80 mg Oral q1800  . buPROPion  150 mg Oral BID  . carvedilol  12.5 mg Oral BID WC  . DULoxetine  60 mg Oral Daily  . furosemide  40 mg Oral Daily  . heparin  5,000 Units Subcutaneous Q8H  . insulin aspart  0-15 Units Subcutaneous TID WC  . insulin detemir  5 Units Subcutaneous QHS  . ipratropium-albuterol  3 mL Nebulization Q4H  . levothyroxine  112 mcg Oral QAC supper  . lisinopril  20 mg Oral Daily  . ticagrelor  60 mg Oral BID   Continuous Infusions: . sodium chloride 50 mL/hr at 11/14/17 1737    Principal Problem:   Stroke-like symptoms Active Problems:   Diabetes type 2, controlled (HCC)   Hyperlipidemia LDL goal <100  Obesity (BMI 30-39.9)   Essential hypertension   Hypothyroidism   CAD (coronary artery disease)   Time spent:   Irwin Brakeman, MD, FAAFP [facility] Pager [phone]  If 7PM-7AM, please contact night-coverage [email or web address] Password [email or web address] 11/15/2017, 4:08 PM    LOS: 0 days

## 2017-11-15 NOTE — Evaluation (Signed)
Physical Therapy Evaluation Patient Details Name: Jennifer [name] MRN: [id number] DOB: 10-19-1955 Today's Date: 11/15/2017   History of Present Illness  Jennifer [name] is a 62 y.o. female with past medical history significant for coronary artery disease status post PCI, diabetes mellitus, type II obesity, hypertension, hypothyroidism and hypercholesterolemia; who presented to the emergency department secondary to acute blurred vision, left-sided paresthesia and numbness.  Patient was last seen normal on 11/13/2017.    Clinical Impression  Patient functioning at baseline for functional mobility and gait, other than demonstrating slightly slower than normal speed of cadence without loss of balance on level, declined and inclined surfaces.  Plan: Patient discharged from physical therapy to care of nursing for ambulation daily as tolerated for length of stay.     Follow Up Recommendations No PT follow up    Equipment Recommendations  None recommended by PT    Recommendations for Other Services       Precautions / Restrictions Precautions Precautions: None Restrictions Weight Bearing Restrictions: No      Mobility  Bed Mobility Overal bed mobility: Independent                Transfers Overall transfer level: Independent                  Ambulation/Gait Ambulation/Gait assistance: Modified independent (Device/Increase time) Ambulation Distance (Feet): 150 Feet Assistive device: None Gait Pattern/deviations: WFL(Within Functional Limits) Gait velocity: decreased   General Gait Details: grossly WFL except slower than normal cadence, no loss of balance on level, declined, or inclined surfaces  Stairs            Wheelchair Mobility    Modified Rankin (Stroke Patients Only)       Balance Overall balance assessment: No apparent balance deficits (not formally assessed)                                           Pertinent Vitals/Pain Pain  Assessment: No/denies pain    Home Living Family/patient expects to be discharged to:: Private residence Living Arrangements: Spouse/significant other;Children Available Help at Discharge: Family Type of Home: House Home Access: Stairs to enter Entrance Stairs-Rails: Right;Left;Can reach both Technical brewer of Steps: 5 Home Layout: One level Home Equipment: Cane - single point;Walker - 2 wheels;Bedside commode      Prior Function Level of Independence: Independent         Comments: community ambulator, drives, works     Journalist, newspaper        Extremity/Trunk Assessment   Upper Extremity Assessment Upper Extremity Assessment: Overall WFL for tasks assessed    Lower Extremity Assessment Lower Extremity Assessment: Overall WFL for tasks assessed    Cervical / Trunk Assessment Cervical / Trunk Assessment: Normal  Communication   Communication: No difficulties  Cognition Arousal/Alertness: Awake/alert Behavior During Therapy: WFL for tasks assessed/performed Overall Cognitive Status: Within Functional Limits for tasks assessed                                        General Comments      Exercises     Assessment/Plan    PT Assessment Patent does not need any further PT services  PT Problem List         PT Treatment Interventions  PT Goals (Current goals can be found in the Care Plan section)  Acute Rehab PT Goals Patient Stated Goal: return home PT Goal Formulation: With patient Time For Goal Achievement: Dec 11, 2017 Potential to Achieve Goals: Good    Frequency     Barriers to discharge        Co-evaluation               AM-PAC PT "6 Clicks" Daily Activity  Outcome Measure Difficulty turning over in bed (including adjusting bedclothes, sheets and blankets)?: None Difficulty moving from lying on back to sitting on the side of the bed? : None Difficulty sitting down on and standing up from a chair with arms (e.g.,  wheelchair, bedside commode, etc,.)?: None Help needed moving to and from a bed to chair (including a wheelchair)?: None Help needed walking in hospital room?: None Help needed climbing 3-5 steps with a railing? : None 6 Click Score: 24    End of Session   Activity Tolerance: Patient tolerated treatment well Patient left: in bed;with call bell/phone within reach;with family/visitor present(seated at bedside) Nurse Communication: Mobility status PT Visit Diagnosis: Unsteadiness on feet (R26.81);Other abnormalities of gait and mobility (R26.89);Muscle weakness (generalized) (M62.81)    Time: 5208-0223 PT Time Calculation (min) (ACUTE ONLY): 19 min   Charges:   PT Evaluation $PT Eval Low Complexity: 1 Low PT Treatments $Gait Training: 8-22 mins   PT G Codes:        1:50 PM, Dec 11, 2017 Lonell Grandchild, MPT Physical Therapist with [facility] 336 [phone] office [phone] mobile phone

## 2017-11-16 DIAGNOSIS — R2990 Unspecified symptoms and signs involving the nervous system: Secondary | ICD-10-CM | POA: Diagnosis not present

## 2017-11-16 DIAGNOSIS — E11319 Type 2 diabetes mellitus with unspecified diabetic retinopathy without macular edema: Secondary | ICD-10-CM | POA: Diagnosis not present

## 2017-11-16 DIAGNOSIS — I10 Essential (primary) hypertension: Secondary | ICD-10-CM | POA: Diagnosis not present

## 2017-11-16 DIAGNOSIS — F17200 Nicotine dependence, unspecified, uncomplicated: Secondary | ICD-10-CM

## 2017-11-16 DIAGNOSIS — Z8673 Personal history of transient ischemic attack (TIA), and cerebral infarction without residual deficits: Secondary | ICD-10-CM | POA: Diagnosis present

## 2017-11-16 DIAGNOSIS — I639 Cerebral infarction, unspecified: Secondary | ICD-10-CM

## 2017-11-16 DIAGNOSIS — I2510 Atherosclerotic heart disease of native coronary artery without angina pectoris: Secondary | ICD-10-CM | POA: Diagnosis not present

## 2017-11-16 LAB — GLUCOSE, CAPILLARY
GLUCOSE-CAPILLARY: 188 mg/dL — AB (ref 65–99)
Glucose-Capillary: 145 mg/dL — ABNORMAL HIGH (ref 65–99)

## 2017-11-16 MED ORDER — FUROSEMIDE 40 MG PO TABS: 40.0000 mg | ORAL_TABLET | Freq: Every day | ORAL | 3 refills | Status: DC

## 2017-11-16 MED ORDER — LISINOPRIL 20 MG PO TABS: 20.0000 mg | ORAL_TABLET | Freq: Every day | ORAL | 0 refills | Status: DC

## 2017-11-16 MED ORDER — ASPIRIN 81 MG PO TBEC: 162.0000 mg | DELAYED_RELEASE_TABLET | Freq: Every day | ORAL | 12 refills | Status: DC

## 2017-11-16 NOTE — Progress Notes (Signed)
SLP Cancellation Note  Patient Details Name: Jennifer [name] MRN: [id number] DOB: [birth year]   Cancelled treatment:       Reason Eval/Treat Not Completed: SLP screened, no needs identified, will sign off; Pt and family deny changes in speech, language, swallowing, or cognition. SLE will be deferred at this time. Reconsult if indicated.   Thank you,  Genene Churn, [place]    [place] 11/16/2017, 10:30 AM

## 2017-11-16 NOTE — Discharge Instructions (Signed)
PLEASE POSTPONE ANY ELECTIVE SURGERIES FOR 1 MONTH PLEASE STOP SMOKING.    Follow with Primary MD  [name], [name], MD  and other consultant's as instructed your Hospitalist MD  Please get a complete blood count and chemistry panel checked by your Primary MD at your next visit, and again as instructed by your Primary MD.  Get Medicines reviewed and adjusted: Please take all your medications with you for your next visit with your Primary MD  Laboratory/radiological data: Please request your Primary MD to go over all hospital tests and procedure/radiological results at the follow up, please ask your Primary MD to get all Hospital records sent to his/her office.  In some cases, they will be blood work, cultures and biopsy results pending at the time of your discharge. Please request that your primary care M.D. follows up on these results.  Also Note the following: If you experience worsening of your admission symptoms, develop shortness of breath, life threatening emergency, suicidal or homicidal thoughts you must seek medical attention immediately by calling 911 or calling your MD immediately  if symptoms less severe.  You must read complete instructions/literature along with all the possible adverse reactions/side effects for all the Medicines you take and that have been prescribed to you. Take any new Medicines after you have completely understood and accpet all the possible adverse reactions/side effects.   Do not drive when taking Pain medications or sleeping medications (Benzodaizepines)  Do not take more than prescribed Pain, Sleep and Anxiety Medications. It is not advisable to combine anxiety,sleep and pain medications without talking with your primary care practitioner  Special Instructions: If you have smoked or chewed Tobacco  in the last 2 yrs please stop smoking, stop any regular Alcohol  and or any Recreational drug use.  Wear Seat belts while driving.  Please note: You were  cared for by a hospitalist during your hospital stay. Once you are discharged, your primary care physician will handle any further medical issues. Please note that NO REFILLS for any discharge medications will be authorized once you are discharged, as it is imperative that you return to your primary care physician (or establish a relationship with a primary care physician if you do not have one) for your post hospital discharge needs so that they can reassess your need for medications and monitor your lab values.

## 2017-11-16 NOTE — Progress Notes (Signed)
IV discontinued,catheter intact. Discharge instructions given on medications,and follow up visits,patient verbalized understanding. Patient given a educational booklet, called Stroke early stages of recovery.Accompanied by staff to an awaiting vehicle.

## 2017-11-16 NOTE — Discharge Summary (Signed)
Physician Discharge Summary  Jennifer [name] YJE:[id number] DOB: 1956/05/09 DOA: 11/14/2017  PCP: Abner Greenspan, MD NEUROLOGIST: Merlene Laughter  Admit date: 11/14/2017 Discharge date: 11/16/2017  Admitted From: HOME  Disposition: HOME  Recommendations for Outpatient Follow-up:  1. Follow up with PCP in 1 weeks 2. FOLLOW UP WITH NEUROLOGY IN 1 MONTH 3. Please obtain BMP/CBC in one week  Discharge Condition: STABLE   CODE STATUS: FULL    Brief Hospitalization Summary: Please see all hospital notes, images, labs for full details of the hospitalization.  HPI: Jennifer [name] is a 62 y.o. female with past medical history significant for coronary artery disease status post PCI, diabetes mellitus, type II obesity, hypertension, hypothyroidism and hypercholesterolemia; who presented to the emergency department secondary to acute blurred vision, left-sided paresthesia and numbness.  Patient was last seen normal on 11/13/2017. Patient reports sudden onset of the symptoms for what she thought were associated with her eye cataracts.  Given the numbness and tingling sensation on the left side of her body, she decided to come to the emergency department for further evaluation.  She expressed that the symptoms has no change or worsen since presentation.  She denies any motor deficit.  Also denies chest pain, shortness of breath, headache, abdominal pain, nausea, vomiting, hematuria, melena, hematochezia, hematemesis or any other acute complaints.  In the ED, CT scan of the head demonstrated no acute intracranial abnormalities and overall blood work was stable. Case was discussed with tele-neurology who recommended observation and a stroke work-up.  MDM/Assessment & Plan:   1. Acute lacunar infarcts - Pt admitted for further management, continue stroke work up.  Obtain neurology consult, continue aspirin and brilinta for now. Neurology increased aspirin to 162 mg daily.  A1c 7.1%, TSH 1.837, lipid panel LDL 95, HDL 35,  TC 157, trig 134, PT/OT no needs identified, SLP no needs identified, allowed permissive hypertension, resume home medications at discharge. Echocardiogram EF 55-60% with grade 1 DD.  Carotid Dopplers no significant stenosis.   2. Type 2 DM - resume home oral medications, A1c 7.1%. Follow up with PCP for further management.  3. Hyperlipidemia - continue lipitor 80 mg daily.  4. Essential hypertension - permissive hypertension in hospital, resume home meds at discharge.  5. Hypothyroidism - normal TSH, continue home synthroid.  6. CAD - stable, follow.  7. Hypokalemia - repleted.  8. Tobacco - ongoing tobacco cessation counseling and written information given.  9. COPD - treated with scheduled duoneb treatments in hospital and patient feeling much better.   DVT prophylaxis: heparin Code Status: Full  Family Communication: daughter Disposition Plan: Home     Consultants:  neurology  Discharge Diagnoses:  Principal Problem:   Acute CVA (cerebrovascular accident) [facility]) Active Problems:   Diabetes type 2, controlled ([place])   Hyperlipidemia LDL goal <100   Obesity (BMI 30-39.9)   TOBACCO ABUSE   Essential hypertension   Hypothyroidism   CAD (coronary artery disease)   Stroke-like symptoms   Left sided numbness    Discharge Instructions: Discharge Instructions    Call MD for:  difficulty breathing, headache or visual disturbances   Complete by:  As directed    Call MD for:  extreme fatigue   Complete by:  As directed    Call MD for:  persistant dizziness or light-headedness   Complete by:  As directed    Increase activity slowly   Complete by:  As directed      Allergies as of 11/16/2017   No  Known Allergies     Medication List    TAKE these medications   aspirin 81 MG EC tablet Take 2 tablets (162 mg total) by mouth daily. What changed:  how much to take   atorvastatin 80 MG tablet Commonly known as:  LIPITOR TAKE 1 TABLET BY MOUTH ONCE DAILY AT 6 IN THE  EVENING   buPROPion 150 MG 12 hr tablet Commonly known as:  WELLBUTRIN SR Take 1 tablet (150 mg total) by mouth 2 (two) times daily.   carvedilol 12.5 MG tablet Commonly known as:  COREG TAKE 1 TABLET BY MOUTH TWICE DAILY WITH MEALS   diclofenac 75 MG EC tablet Commonly known as:  VOLTAREN TAKE 1 TABLET BY MOUTH TWICE DAILY WITH A MEAL   DULoxetine 60 MG capsule Commonly known as:  CYMBALTA Take 1 capsule (60 mg total) by mouth daily.   furosemide 40 MG tablet Commonly known as:  LASIX Take 1 tablet (40 mg total) by mouth daily. Start taking on:  11/20/2017 What changed:  These instructions start on 11/20/2017. If you are unsure what to do until then, ask your doctor or other care provider.   HUMALOG 100 UNIT/ML injection Generic drug:  insulin lispro Inject 40 Units into the skin daily as needed for high blood sugar.   levothyroxine 112 MCG tablet Commonly known as:  SYNTHROID, LEVOTHROID Take 112 mcg by mouth every evening.   lisinopril 20 MG tablet Commonly known as:  PRINIVIL,ZESTRIL Take 1 tablet (20 mg total) by mouth daily. Start taking on:  11/17/2017   nitroGLYCERIN 0.4 MG SL tablet Commonly known as:  NITROSTAT Place 1 tablet (0.4 mg total) under the tongue every 5 (five) minutes x 3 doses as needed for chest pain.   sitaGLIPtin 100 MG tablet Commonly known as:  JANUVIA Take 100 mg by mouth every evening.   ticagrelor 60 MG Tabs tablet Commonly known as:  BRILINTA Take 1 tablet (60 mg total) by mouth 2 (two) times daily.      Follow-up Information    [name], [name], MD. Schedule an appointment as soon as possible for a visit in 1 week(s).   Specialties:  Family Medicine, Radiology Contact information: [place] [address] [phone]        Phillips Odor, MD. Schedule an appointment as soon as possible for a visit in 4 week(s).   Specialty:  Neurology Why:  FOLLOW UP STROKE Contact information: [address] [place] [address] [phone]          No Known Allergies Allergies as of 11/16/2017   No Known Allergies     Medication List    TAKE these medications   aspirin 81 MG EC tablet Take 2 tablets (162 mg total) by mouth daily. What changed:  how much to take   atorvastatin 80 MG tablet Commonly known as:  LIPITOR TAKE 1 TABLET BY MOUTH ONCE DAILY AT 6 IN THE EVENING   buPROPion 150 MG 12 hr tablet Commonly known as:  WELLBUTRIN SR Take 1 tablet (150 mg total) by mouth 2 (two) times daily.   carvedilol 12.5 MG tablet Commonly known as:  COREG TAKE 1 TABLET BY MOUTH TWICE DAILY WITH MEALS   diclofenac 75 MG EC tablet Commonly known as:  VOLTAREN TAKE 1 TABLET BY MOUTH TWICE DAILY WITH A MEAL   DULoxetine 60 MG capsule Commonly known as:  CYMBALTA Take 1 capsule (60 mg total) by mouth daily.   furosemide  40 MG tablet Commonly known as:  LASIX Take 1 tablet (40 mg total) by mouth daily. Start taking on:  11/20/2017 What changed:  These instructions start on 11/20/2017. If you are unsure what to do until then, ask your doctor or other care provider.   HUMALOG 100 UNIT/ML injection Generic drug:  insulin lispro Inject 40 Units into the skin daily as needed for high blood sugar.   levothyroxine 112 MCG tablet Commonly known as:  SYNTHROID, LEVOTHROID Take 112 mcg by mouth every evening.   lisinopril 20 MG tablet Commonly known as:  PRINIVIL,ZESTRIL Take 1 tablet (20 mg total) by mouth daily. Start taking on:  11/17/2017   nitroGLYCERIN 0.4 MG SL tablet Commonly known as:  NITROSTAT Place 1 tablet (0.4 mg total) under the tongue every 5 (five) minutes x 3 doses as needed for chest pain.   sitaGLIPtin 100 MG tablet Commonly known as:  JANUVIA Take 100 mg by mouth every evening.   ticagrelor 60 MG Tabs tablet Commonly known as:  BRILINTA Take 1 tablet (60 mg total) by mouth 2 (two) times daily.       Procedures/Studies: Ct Head Wo Contrast  Result  Date: 11/14/2017 CLINICAL DATA:  Paresthesia, blurred vision. EXAM: CT HEAD WITHOUT CONTRAST TECHNIQUE: Contiguous axial images were obtained from the base of the skull through the vertex without intravenous contrast. COMPARISON:  CT scan of September 27, 2004. FINDINGS: Brain: Mild chronic ischemic white matter disease is noted. Old lacunar infarction is noted in right basal ganglia. No mass effect or midline shift is noted. Ventricular size is within normal limits. There is no evidence of mass lesion, hemorrhage or acute infarction. Vascular: No hyperdense vessel or unexpected calcification. Skull: Normal. Negative for fracture or focal lesion. Sinuses/Orbits: No acute finding. Other: None. IMPRESSION: Mild chronic ischemic white matter disease. No acute intracranial abnormality seen. Electronically Signed   By: Marijo Conception, M.D.   On: 11/14/2017 12:14   Mr Brain Wo Contrast  Result Date: 11/14/2017 CLINICAL DATA:  Left-sided tingling for 12 hours. Initial encounter. EXAM: MRI HEAD WITHOUT CONTRAST MRA HEAD WITHOUT CONTRAST TECHNIQUE: Multiplanar, multiecho pulse sequences of the brain and surrounding structures were obtained without intravenous contrast. Angiographic images of the head were obtained using MRA technique without contrast. COMPARISON:  Head CT from earlier today.  Brain MRI 10/11/2004 FINDINGS: MRI HEAD FINDINGS Brain: Subcentimeter foci of restricted diffusion in the right thalamus and right juxtacortical frontal parietal region. There is extensive FLAIR hyperintensity in the pons, progressed from 2006. Perforator infarct has occurred in the right lateral lenticulostriate distribution. Remote lacunar infarct in the left frontal white matter. Two small remote left cerebellar infarcts. Moderate supratentorial chronic ischemic gliosis, progressed. No hemorrhage, hydrocephalus, collection, or atrophy. Vascular: Major flow voids are preserved. Skull and upper cervical spine: No evidence of marrow  lesion Sinuses/Orbits: Negative MRA HEAD FINDINGS Symmetric carotid arteries. Mild right vertebral artery dominance. Dominant left PICA. Codominant right PICA and AICA. Carotid, vertebral, basilar, and medium size branches are widely patent. Mild narrowing of the inferior division right M2 branch. Negative for aneurysm. IMPRESSION: 1. Acute lacunar infarcts in the right thalamus and right frontal parietal white matter. 2. Chronic small vessel ischemia including remote small vessel infarcts that have progressed since 2006. 3. Negative intracranial MRA. Electronically Signed   By: Monte Fantasia M.D.   On: 11/14/2017 13:40   US Carotid Bilateral (at [facility] Only)  Result Date: 11/14/2017 CLINICAL DATA:  62 year old female with stroke-like symptoms  and acute lacunar infarcts in the right thalamus and right frontal parietal white matter. EXAM: BILATERAL CAROTID DUPLEX ULTRASOUND TECHNIQUE: Pearline Cables scale imaging, color Doppler and duplex ultrasound were performed of bilateral carotid and vertebral arteries in the neck. COMPARISON:  Brain MRI 11/14/2017 FINDINGS: Criteria: Quantification of carotid stenosis is based on velocity parameters that correlate the residual internal carotid diameter with NASCET-based stenosis levels, using the diameter of the distal internal carotid lumen as the denominator for stenosis measurement. The following velocity measurements were obtained: RIGHT ICA: 124/32 cm/sec CCA: 40/9 cm/sec SYSTOLIC ICA/CCA RATIO:  2.1 ECA:  89 cm/sec LEFT ICA: 70/19 cm/sec CCA: 81/19 cm/sec SYSTOLIC ICA/CCA RATIO:  1.3 ECA:  74 cm/sec RIGHT CAROTID ARTERY: Trace heterogeneous atherosclerotic plaque in the proximal internal carotid artery. By peak systolic velocity criteria, the estimated stenosis remains less than 50%. RIGHT VERTEBRAL ARTERY:  Patent with normal antegrade flow. LEFT CAROTID ARTERY: Trace heterogeneous atherosclerotic plaque in the proximal internal carotid artery. By peak systolic velocity  criteria, the estimated stenosis remains less than 50%. LEFT VERTEBRAL ARTERY:  Patent with normal antegrade flow. IMPRESSION: 1. Mild (1-49%) stenosis proximal right internal carotid artery secondary to heterogenous atherosclerotic plaque. 2. Mild (1-49%) stenosis proximal left internal carotid artery secondary to heterogenous atherosclerotic plaque. 3. Vertebral arteries are patent with normal antegrade flow. Signed, Criselda Peaches, MD [facility] Electronically Signed   By: Jacqulynn Cadet M.D.   On: 11/14/2017 14:30   Mr Jodene Nam Head/brain JY Cm  Result Date: 11/14/2017 CLINICAL DATA:  Left-sided tingling for 12 hours. Initial encounter. EXAM: MRI HEAD WITHOUT CONTRAST MRA HEAD WITHOUT CONTRAST TECHNIQUE: Multiplanar, multiecho pulse sequences of the brain and surrounding structures were obtained without intravenous contrast. Angiographic images of the head were obtained using MRA technique without contrast. COMPARISON:  Head CT from earlier today.  Brain MRI 10/11/2004 FINDINGS: MRI HEAD FINDINGS Brain: Subcentimeter foci of restricted diffusion in the right thalamus and right juxtacortical frontal parietal region. There is extensive FLAIR hyperintensity in the pons, progressed from 2006. Perforator infarct has occurred in the right lateral lenticulostriate distribution. Remote lacunar infarct in the left frontal white matter. Two small remote left cerebellar infarcts. Moderate supratentorial chronic ischemic gliosis, progressed. No hemorrhage, hydrocephalus, collection, or atrophy. Vascular: Major flow voids are preserved. Skull and upper cervical spine: No evidence of marrow lesion Sinuses/Orbits: Negative MRA HEAD FINDINGS Symmetric carotid arteries. Mild right vertebral artery dominance. Dominant left PICA. Codominant right PICA and AICA. Carotid, vertebral, basilar, and medium size branches are widely patent. Mild narrowing of the inferior  division right M2 branch. Negative for aneurysm. IMPRESSION: 1. Acute lacunar infarcts in the right thalamus and right frontal parietal white matter. 2. Chronic small vessel ischemia including remote small vessel infarcts that have progressed since 2006. 3. Negative intracranial MRA. Electronically Signed   By: Monte Fantasia M.D.   On: 11/14/2017 13:40      Subjective: Pt says that she is feeling a lot better, she would like to go home.  No complaints.   Discharge Exam: Vitals:   11/16/17 0530 11/16/17 0930  BP: (!) 184/71 (!) 172/88  Pulse: 76 75  Resp: 16 20  Temp: 98.2 F (36.8 C) 98.6 F (37 C)  SpO2: 97% 95%   Vitals:   11/15/17 2215 11/16/17 0131 11/16/17 0530 11/16/17 0930  BP: (!) 161/68 (!) 162/68 (!) 184/71 (!) 172/88  Pulse: 73 77 76 75  Resp: 17 16 16 20   Temp: 98.2 F (36.8  C) (!) 97.5 F (36.4 C) 98.2 F (36.8 C) 98.6 F (37 C)  TempSrc: Oral   Oral  SpO2: 92% 95% 97% 95%  Weight:      Height:        General: Pt is alert, awake, not in acute distress Cardiovascular: RRR, S1/S2 +, no rubs, no gallops Respiratory: much improved, basically clear, no wheezing, no rhonchi Abdominal: Soft, NT, ND, bowel sounds + Extremities: no edema, no cyanosis Neurological: nonfocal   The results of significant diagnostics from this hospitalization (including imaging, microbiology, ancillary and laboratory) are listed below for reference.     Microbiology: No results found for this or any previous visit (from the past 240 hour(s)).   Labs: BNP (last 3 results) No results for input(s): BNP in the last 8760 hours. Basic Metabolic Panel: Recent Labs  Lab 11/14/17 1109 11/14/17 1124 11/14/17 1238 11/15/17 0518  NA 140 141  --  143  K 3.6 3.4*  --  4.1  CL 102 100*  --  105  CO2 31  --   --  32  GLUCOSE 214* 201*  --  78  BUN 14 13  --  13  CREATININE 0.80 0.80  --  0.96  CALCIUM 8.7*  --   --  8.7*  MG  --   --  1.8  --   PHOS  --   --  4.4  --    Liver  Function Tests: Recent Labs  Lab 11/14/17 1109  AST 20  ALT 21  ALKPHOS 69  BILITOT 0.6  PROT 6.0*  ALBUMIN 2.9*   No results for input(s): LIPASE, AMYLASE in the last 168 hours. No results for input(s): AMMONIA in the last 168 hours. CBC: Recent Labs  Lab 11/14/17 1109 11/14/17 1124 11/15/17 0518  WBC 7.4  --  7.0  NEUTROABS 5.4  --   --   HGB 12.9 13.3 12.8  HCT 41.0 39.0 40.8  MCV 86.7  --  87.0  PLT 243  --  225   Cardiac Enzymes: No results for input(s): CKTOTAL, CKMB, CKMBINDEX, TROPONINI in the last 168 hours. BNP: Invalid input(s): POCBNP CBG: Recent Labs  Lab 11/15/17 1156 11/15/17 1658 11/15/17 2038 11/16/17 0728 11/16/17 1150  GLUCAP 124* 161* 217* 145* 188*   D-Dimer No results for input(s): DDIMER in the last 72 hours. Hgb A1c Recent Labs    11/14/17 1109  HGBA1C 7.1*   Lipid Profile Recent Labs    11/15/17 0518  CHOL 157  HDL 35*  LDLCALC 95  TRIG 134  CHOLHDL 4.5   Thyroid function studies Recent Labs    11/14/17 1109  TSH 1.837   Anemia work up Recent Labs    11/15/17 0518  VITAMINB12 347   Urinalysis    Component Value Date/Time   COLORURINE STRAW (A) 11/14/2017 1109   APPEARANCEUR CLEAR 11/14/2017 1109   LABSPEC 1.005 11/14/2017 1109   PHURINE 6.0 11/14/2017 1109   GLUCOSEU NEGATIVE 11/14/2017 1109   [place] 11/14/2017 1109   [place] 11/14/2017 1109   BILIRUBINUR Negative 10/26/2017 0911   KETONESUR NEGATIVE 11/14/2017 1109   PROTEINUR 100 (A) 11/14/2017 1109   UROBILINOGEN 0.2 10/26/2017 0911   NITRITE NEGATIVE 11/14/2017 1109   LEUKOCYTESUR NEGATIVE 11/14/2017 1109   Sepsis Labs Invalid input(s): PROCALCITONIN,  WBC,  LACTICIDVEN Microbiology No results found for this or any previous visit (from the past 240 hour(s)).  Time coordinating discharge:   SIGNED:  Irwin Brakeman, MD  [facility] 11/16/2017, 2:32 PM Pager [phone]  If 7PM-7AM, please contact  night-coverage [email or web address] Password [email or web address]

## 2017-11-20 ENCOUNTER — Telehealth: Payer: Self-pay

## 2017-11-20 NOTE — Telephone Encounter (Signed)
Transition Care Management Follow-up Telephone Call   Date discharged? 11/16/17   How have you been since you were released from the hospital? "I don't know, I guess I am okay, I am up walking around and feeling okay".    Do you understand why you were in the hospital? Yes, "I had a stroke"   Do you understand the discharge instructions? Yes   Where were you discharged to? Home    Items Reviewed:  Medications reviewed: Yes  Allergies reviewed: Yes  Dietary changes reviewed: No changes  Referrals reviewed: Yes, Should see neurologist, given number from hospital, she will call today and set up.     Functional Questionnaire:  Activities of Daily Living (ADLs):   She states they are independent in the following: all ADL'S, dressing, grooming, bathing, eating, toileting and ambulation. States they require assistance with the following: Patient does not require any assistance.   Any transportation issues/concerns?: Patient currently is not driving but does have someone to bring her to appointments and meet any transportation needs.    Any patient concerns? No   Confirmed importance and date/time of follow-up visits scheduled:  Yes  Provider Appointment booked with Dr. Glori Bickers at 4:15pm on 11/21/17  Confirmed with patient if condition begins to worsen call PCP or go to the ER.  Patient was given the office number and encouraged to call back with question or concerns.  : Yes

## 2017-11-21 ENCOUNTER — Encounter: Payer: Self-pay | Admitting: Family Medicine

## 2017-11-21 ENCOUNTER — Ambulatory Visit: Payer: 59 | Admitting: Family Medicine

## 2017-11-21 VITALS — BP 128/80 | HR 82 | Temp 98.6°F | Ht 67.0 in | Wt 235.8 lb

## 2017-11-21 DIAGNOSIS — F17200 Nicotine dependence, unspecified, uncomplicated: Secondary | ICD-10-CM

## 2017-11-21 DIAGNOSIS — E669 Obesity, unspecified: Secondary | ICD-10-CM | POA: Diagnosis not present

## 2017-11-21 DIAGNOSIS — E785 Hyperlipidemia, unspecified: Secondary | ICD-10-CM | POA: Diagnosis not present

## 2017-11-21 DIAGNOSIS — Z8673 Personal history of transient ischemic attack (TIA), and cerebral infarction without residual deficits: Secondary | ICD-10-CM

## 2017-11-21 DIAGNOSIS — I639 Cerebral infarction, unspecified: Secondary | ICD-10-CM

## 2017-11-21 DIAGNOSIS — I10 Essential (primary) hypertension: Secondary | ICD-10-CM

## 2017-11-21 NOTE — Assessment & Plan Note (Signed)
bp in fair control at this time  BP Readings from Last 1 Encounters:  11/21/17 128/80   No changes needed Most recent labs reviewed  Disc lifstyle change with low sodium diet and exercise  Recent CVA-was hypertensive in hospital now back to baseline

## 2017-11-21 NOTE — Progress Notes (Signed)
Subjective:    Patient ID: Jennifer [name], female    DOB: Oct 15, 1955, 62 y.o.   MRN: [id number]  HPI Here for hospital f/u Hospitalized from 6/4 to 11/16/17 for symptoms of blurred vision, L sided paresthesias   CT was normal Stable labs  Neurology consulted and diagnosed acute lacunar infarcts - stroke w/u was initiated  R thalamus and R frontal/parietal areas   Echocardiogram showed EF 55-60% with grade one DD Carotid dopplers-no sig stenosis   Sent home on home meds indluding brilinta an dasa  Permissive HTN in hospital given infarct  No needs found for PT/OT Tobacco counseling given  Had duo nebs for copd   Lab Results  Component Value Date   CREATININE 0.96 11/15/2017   BUN 13 11/15/2017   NA 143 11/15/2017   K 4.1 11/15/2017   CL 105 11/15/2017   CO2 32 11/15/2017   Lab Results  Component Value Date   ALT 21 11/14/2017   AST 20 11/14/2017   ALKPHOS 69 11/14/2017   BILITOT 0.6 11/14/2017   Lab Results  Component Value Date   CHOL 157 11/15/2017   HDL 35 (L) 11/15/2017   LDLCALC 95 11/15/2017   LDLDIRECT 154.2 11/04/2011   TRIG 134 11/15/2017   CHOLHDL 4.5 11/15/2017   On statin  Lab Results  Component Value Date   VITAMINB12 347 11/15/2017   Lab Results  Component Value Date   WBC 7.0 11/15/2017   HGB 12.8 11/15/2017   HCT 40.8 11/15/2017   MCV 87.0 11/15/2017   PLT 225 11/15/2017     Imaging Ct Head Wo Contrast  Result Date: 11/14/2017 CLINICAL DATA:  Paresthesia, blurred vision. EXAM: CT HEAD WITHOUT CONTRAST TECHNIQUE: Contiguous axial images were obtained from the base of the skull through the vertex without intravenous contrast. COMPARISON:  CT scan of September 27, 2004. FINDINGS: Brain: Mild chronic ischemic white matter disease is noted. Old lacunar infarction is noted in right basal ganglia. No mass effect or midline shift is noted. Ventricular size is within normal limits. There is no evidence of mass lesion, hemorrhage or acute infarction.  Vascular: No hyperdense vessel or unexpected calcification. Skull: Normal. Negative for fracture or focal lesion. Sinuses/Orbits: No acute finding. Other: None. IMPRESSION: Mild chronic ischemic white matter disease. No acute intracranial abnormality seen. Electronically Signed   By: Marijo Conception, M.D.   On: 11/14/2017 12:14   Mr Brain Wo Contrast  Result Date: 11/14/2017 CLINICAL DATA:  Left-sided tingling for 12 hours. Initial encounter. EXAM: MRI HEAD WITHOUT CONTRAST MRA HEAD WITHOUT CONTRAST TECHNIQUE: Multiplanar, multiecho pulse sequences of the brain and surrounding structures were obtained without intravenous contrast. Angiographic images of the head were obtained using MRA technique without contrast. COMPARISON:  Head CT from earlier today.  Brain MRI 10/11/2004 FINDINGS: MRI HEAD FINDINGS Brain: Subcentimeter foci of restricted diffusion in the right thalamus and right juxtacortical frontal parietal region. There is extensive FLAIR hyperintensity in the pons, progressed from 2006. Perforator infarct has occurred in the right lateral lenticulostriate distribution. Remote lacunar infarct in the left frontal white matter. Two small remote left cerebellar infarcts. Moderate supratentorial chronic ischemic gliosis, progressed. No hemorrhage, hydrocephalus, collection, or atrophy. Vascular: Major flow voids are preserved. Skull and upper cervical spine: No evidence of marrow lesion Sinuses/Orbits: Negative MRA HEAD FINDINGS Symmetric carotid arteries. Mild right vertebral artery dominance. Dominant left PICA. Codominant right PICA and AICA. Carotid, vertebral, basilar, and medium size branches are widely patent. Mild narrowing of the inferior  division right M2 branch. Negative for aneurysm. IMPRESSION: 1. Acute lacunar infarcts in the right thalamus and right frontal parietal white matter. 2. Chronic small vessel ischemia including remote small vessel infarcts that have progressed since 2006. 3. Negative  intracranial MRA. Electronically Signed   By: Monte Fantasia M.D.   On: 11/14/2017 13:40   US Carotid Bilateral (at [facility] Only)  Result Date: 11/14/2017 CLINICAL DATA:  62 year old female with stroke-like symptoms and acute lacunar infarcts in the right thalamus and right frontal parietal white matter. EXAM: BILATERAL CAROTID DUPLEX ULTRASOUND TECHNIQUE: Pearline Cables scale imaging, color Doppler and duplex ultrasound were performed of bilateral carotid and vertebral arteries in the neck. COMPARISON:  Brain MRI 11/14/2017 FINDINGS: Criteria: Quantification of carotid stenosis is based on velocity parameters that correlate the residual internal carotid diameter with NASCET-based stenosis levels, using the diameter of the distal internal carotid lumen as the denominator for stenosis measurement. The following velocity measurements were obtained: RIGHT ICA: 124/32 cm/sec CCA: 38/4 cm/sec SYSTOLIC ICA/CCA RATIO:  2.1 ECA:  89 cm/sec LEFT ICA: 70/19 cm/sec CCA: 66/59 cm/sec SYSTOLIC ICA/CCA RATIO:  1.3 ECA:  74 cm/sec RIGHT CAROTID ARTERY: Trace heterogeneous atherosclerotic plaque in the proximal internal carotid artery. By peak systolic velocity criteria, the estimated stenosis remains less than 50%. RIGHT VERTEBRAL ARTERY:  Patent with normal antegrade flow. LEFT CAROTID ARTERY: Trace heterogeneous atherosclerotic plaque in the proximal internal carotid artery. By peak systolic velocity criteria, the estimated stenosis remains less than 50%. LEFT VERTEBRAL ARTERY:  Patent with normal antegrade flow. IMPRESSION: 1. Mild (1-49%) stenosis proximal right internal carotid artery secondary to heterogenous atherosclerotic plaque. 2. Mild (1-49%) stenosis proximal left internal carotid artery secondary to heterogenous atherosclerotic plaque. 3. Vertebral arteries are patent with normal antegrade flow. Signed, Criselda Peaches, MD [facility] Radiology Electronically Signed    By: Jacqulynn Cadet M.D.   On: 11/14/2017 14:30   Mr Jodene Nam Head/brain DJ Cm  Result Date: 11/14/2017 CLINICAL DATA:  Left-sided tingling for 12 hours. Initial encounter. EXAM: MRI HEAD WITHOUT CONTRAST MRA HEAD WITHOUT CONTRAST TECHNIQUE: Multiplanar, multiecho pulse sequences of the brain and surrounding structures were obtained without intravenous contrast. Angiographic images of the head were obtained using MRA technique without contrast. COMPARISON:  Head CT from earlier today.  Brain MRI 10/11/2004 FINDINGS: MRI HEAD FINDINGS Brain: Subcentimeter foci of restricted diffusion in the right thalamus and right juxtacortical frontal parietal region. There is extensive FLAIR hyperintensity in the pons, progressed from 2006. Perforator infarct has occurred in the right lateral lenticulostriate distribution. Remote lacunar infarct in the left frontal white matter. Two small remote left cerebellar infarcts. Moderate supratentorial chronic ischemic gliosis, progressed. No hemorrhage, hydrocephalus, collection, or atrophy. Vascular: Major flow voids are preserved. Skull and upper cervical spine: No evidence of marrow lesion Sinuses/Orbits: Negative MRA HEAD FINDINGS Symmetric carotid arteries. Mild right vertebral artery dominance. Dominant left PICA. Codominant right PICA and AICA. Carotid, vertebral, basilar, and medium size branches are widely patent. Mild narrowing of the inferior division right M2 branch. Negative for aneurysm. IMPRESSION: 1. Acute lacunar infarcts in the right thalamus and right frontal parietal white matter. 2. Chronic small vessel ischemia including remote small vessel infarcts that have progressed since 2006. 3. Negative intracranial MRA. Electronically Signed   By: Monte Fantasia M.D.   On: 11/14/2017 13:40    Wt Readings from Last 3 Encounters:  11/21/17 235 lb 12 oz (106.9 kg)  11/14/17 242 lb (109.8 kg)  11/09/17 242  lb 4 oz (109.9 kg)  wt is down 7 lb - trying to eat less  36.92  kg/m   BP BP Readings from Last 3 Encounters:  11/21/17 128/80  11/16/17 (!) 166/87  11/09/17 136/76   Pulse Pulse Readings from Last 3 Encounters:  11/21/17 82  11/16/17 73  11/09/17 85   Neuro appt was recommended for 1 mo   Pt states all of her stroke symptoms are gone  Vision is ok /improved    (though she needs cataracts removed) -has to wait  Tingling is gone  occ headache-unsure if related   Blood glucose is stable - 130s this am   She plans to work on smoking cessation now  Has some nicorette gum    Patient Active Problem List   Diagnosis Date Noted  . Acute CVA (cerebrovascular accident) ([place]) 11/16/2017  . Rash and nonspecific skin eruption 11/09/2017  . Low back pain 10/26/2017  . Screening mammogram, encounter for 09/18/2017  . CAD (coronary artery disease) 11/04/2015  . NSTEMI (non-ST elevated myocardial infarction) ([place]) 11/03/2015  . Hypothyroidism 07/06/2010  . Hyperlipidemia LDL goal <100 08/28/2008  . Essential hypertension 11/13/2007  . Diabetes type 2, controlled ([place]) 09/21/2006  . DIABETIC PERIPHERAL NEUROPATHY 09/21/2006  . Obesity (BMI 30-39.9) 09/21/2006  . TOBACCO ABUSE 09/21/2006  . Adjustment disorder with mixed anxiety and depressed mood 09/21/2006  . CARPAL TUNNEL SYNDROME 09/21/2006  . COPD (chronic obstructive pulmonary disease) ([place]) 09/21/2006  . EDEMA 09/21/2006  . MIGRAINES, HX OF 09/21/2006   Past Medical History:  Diagnosis Date  . CAD (coronary artery disease)    cath 5/23 100% dist RCA lesion treated with 2 overlapping Integrity Resolute DES ( 2.25x54mm, 2.25x54mm), 60% mid RCA treated medically, 99% OM2 not amenable to PCI, 70% D1 lesion, EF normal  . Diabetes mellitus without complication ([place])   . Hypercholesteremia   . Hypertension   . Thyroid disease    Past Surgical History:  Procedure Laterality Date  . ABDOMINAL HYSTERECTOMY    . CARDIAC CATHETERIZATION N/A 11/03/2015   Procedure: Left Heart Cath and Coronary  Angiography;  Surgeon: Leonie Man, MD;  Location: [facility];  Service: Cardiovascular;  Laterality: N/A;  . CARDIAC CATHETERIZATION N/A 11/03/2015   Procedure: Coronary Stent Intervention;  Surgeon: Leonie Man, MD;  Location: [facility];  Service: Cardiovascular;  Laterality: N/A;  . CARPAL TUNNEL RELEASE     Social History   Tobacco Use  . Smoking status: Current Some Day Smoker    Packs/day: 0.25    Types: Cigarettes  . Smokeless tobacco: Never Used  Substance Use Topics  . Alcohol use: No    Alcohol/week: 0.0 oz  . Drug use: No   Family History  Problem Relation Age of Onset  . Heart disease Mother   . Stroke Sister   . Heart attack Brother    No Known Allergies Current Outpatient Medications on File Prior to Visit  Medication Sig Dispense Refill  . aspirin 81 MG EC tablet Take 2 tablets (162 mg total) by mouth daily. 30 tablet 12  . atorvastatin (LIPITOR) 80 MG tablet TAKE 1 TABLET BY MOUTH ONCE DAILY AT 6 IN THE EVENING 30 tablet 0  . buPROPion (WELLBUTRIN SR) 150 MG 12 hr tablet Take 1 tablet (150 mg total) by mouth 2 (two) times daily. 180 tablet 3  . carvedilol (COREG) 12.5 MG tablet TAKE 1 TABLET BY MOUTH TWICE DAILY WITH MEALS 60 tablet 0  .  diclofenac (VOLTAREN) 75 MG EC tablet TAKE 1 TABLET BY MOUTH TWICE DAILY WITH A MEAL 60 tablet 5  . DULoxetine (CYMBALTA) 60 MG capsule Take 1 capsule (60 mg total) by mouth daily. 90 capsule 3  . furosemide (LASIX) 40 MG tablet Take 1 tablet (40 mg total) by mouth daily. 90 tablet 3  . insulin lispro (HUMALOG) 100 UNIT/ML injection Inject 40 Units into the skin daily as needed for high blood sugar.     . levothyroxine (SYNTHROID, LEVOTHROID) 112 MCG tablet Take 112 mcg by mouth every evening.     Marland Kitchen lisinopril (PRINIVIL,ZESTRIL) 20 MG tablet Take 1 tablet (20 mg total) by mouth daily. 15 tablet 0  . nitroGLYCERIN (NITROSTAT) 0.4 MG SL tablet Place 1 tablet (0.4 mg total) under the tongue every 5 (five)  minutes x 3 doses as needed for chest pain. 25 tablet 3  . sitaGLIPtin (JANUVIA) 100 MG tablet Take 100 mg by mouth every evening.     . ticagrelor (BRILINTA) 60 MG TABS tablet Take 1 tablet (60 mg total) by mouth 2 (two) times daily. 180 tablet 3   No current facility-administered medications on file prior to visit.     Review of Systems  Constitutional: Positive for fatigue. Negative for activity change, appetite change, fever and unexpected weight change.  HENT: Negative for congestion, ear pain, rhinorrhea, sinus pressure and sore throat.   Eyes: Negative for pain, redness and visual disturbance.  Respiratory: Negative for cough, shortness of breath and wheezing.   Cardiovascular: Negative for chest pain and palpitations.  Gastrointestinal: Negative for abdominal pain, blood in stool, constipation and diarrhea.  Endocrine: Negative for polydipsia and polyuria.  Genitourinary: Negative for dysuria, frequency and urgency.  Musculoskeletal: Negative for arthralgias, back pain and myalgias.  Skin: Negative for pallor and rash.  Allergic/Immunologic: Negative for environmental allergies.  Neurological: Negative for dizziness, tremors, seizures, syncope, speech difficulty, weakness, light-headedness, numbness and headaches.  Hematological: Negative for adenopathy. Does not bruise/bleed easily.  Psychiatric/Behavioral: Negative for decreased concentration and dysphoric mood. The patient is not nervous/anxious.        Objective:   Physical Exam  Constitutional: She appears well-developed and well-nourished. No distress.  obese and well appearing   HENT:  Head: Normocephalic and atraumatic.  Mouth/Throat: Oropharynx is clear and moist.  No facial droop Nl speech  Eyes: Pupils are equal, round, and reactive to light. Conjunctivae and EOM are normal.  Neck: Normal range of motion. Neck supple. No JVD present. Carotid bruit is not present. No thyromegaly present.  Cardiovascular: Normal  rate, regular rhythm, normal heart sounds and intact distal pulses. Exam reveals no gallop.  Pulmonary/Chest: Effort normal and breath sounds normal. No respiratory distress. She has no wheezes. She has no rales.  No crackles  Abdominal: Soft. Bowel sounds are normal. She exhibits no distension, no abdominal bruit and no mass. There is no tenderness.  Musculoskeletal: She exhibits no edema.  Lymphadenopathy:    She has no cervical adenopathy.  Neurological: She is alert. She has normal strength and normal reflexes. She displays no atrophy, no tremor and normal reflexes. No cranial nerve deficit or sensory deficit. She exhibits normal muscle tone. She displays a negative Romberg sign. Coordination and gait normal.  No cerebellar signs   Skin: Skin is warm and dry. No rash noted.  Psychiatric: She has a normal mood and affect. Cognition and memory are normal.  Pleasant           Assessment & Plan:  Problem List Items Addressed This Visit      Cardiovascular and Mediastinum   Acute CVA (cerebrovascular accident) ([place]) - Primary    Recent hospitalization for several lacunar infarcts  Reviewed hospital records, lab results and studies in detail   Reassuring echo and carotid studies -in pt with hx of vasc dz/obesity/CAD/ DM and smoking  On asa and Brilinta  Ref for neuro f/u (was rec 1 mo)  All symptoms resolved-baseline today  Strongly enc smoking cessation-she intends to start using nicotine repl (gum) immediately  Ref s/s of CVA- inst to call 911 (voiced awareness)  Will continue to control 2ndary risk factors       Relevant Orders   Ambulatory referral to Neurology   Essential hypertension (Chronic)    bp in fair control at this time  BP Readings from Last 1 Encounters:  11/21/17 128/80   No changes needed Most recent labs reviewed  Disc lifstyle change with low sodium diet and exercise  Recent CVA-was hypertensive in hospital now back to baseline         Other    Hyperlipidemia LDL goal <100    LDL is 95  On high dose lipitor 80 mg  In light of cva- goal for LDL is 70 or below Disc dietary changes  ? Consider adding zetia      Obesity (BMI 30-39.9)    Discussed how this problem influences overall health and the risks it imposes  Reviewed plan for weight loss with lower calorie diet (via better food choices and also portion control or program like weight watchers) and exercise building up to or more than 30 minutes 5 days per week including some aerobic activity         TOBACCO ABUSE (Chronic)    This is a possible big factor in her recent CVA Disc cessation Has cut to 6 cig per day  Plans to use nicotine repl (gum) to start  Offered px chantix if she wants to try in the future Disc strategy for cessation  Unfortunately-husband still smokes and does not plan to quit/is also un supportive

## 2017-11-21 NOTE — Patient Instructions (Addendum)
Please try to quit smoking - this is a big factor in preventing another stroke  Take care of yourself  Try to eat a low cholesterol diet  Avoid red meat/ fried foods/ egg yolks/ fatty breakfast meats/ butter, cheese and high fat dairy/ and shellfish    I'm glad you are feeling better   We will refer you to neurology   Continue to watch for stroke symptoms - numbness/weakness/facial droop - call 911

## 2017-11-21 NOTE — Assessment & Plan Note (Signed)
This is a possible big factor in her recent CVA Disc cessation Has cut to 6 cig per day  Plans to use nicotine repl (gum) to start  Offered px chantix if she wants to try in the future Disc strategy for cessation  Unfortunately-husband still smokes and does not plan to quit/is also un supportive

## 2017-11-21 NOTE — Assessment & Plan Note (Signed)
Recent hospitalization for several lacunar infarcts  Reviewed hospital records, lab results and studies in detail   Reassuring echo and carotid studies -in pt with hx of vasc dz/obesity/CAD/ DM and smoking  On asa and Brilinta  Ref for neuro f/u (was rec 1 mo)  All symptoms resolved-baseline today  Strongly enc smoking cessation-she intends to start using nicotine repl (gum) immediately  Ref s/s of CVA- inst to call 911 (voiced awareness)  Will continue to control 2ndary risk factors

## 2017-11-21 NOTE — Assessment & Plan Note (Signed)
LDL is 95  On high dose lipitor 80 mg  In light of cva- goal for LDL is 70 or below Disc dietary changes  ? Consider adding zetia

## 2017-11-21 NOTE — Assessment & Plan Note (Signed)
Discussed how this problem influences overall health and the risks it imposes  Reviewed plan for weight loss with lower calorie diet (via better food choices and also portion control or program like weight watchers) and exercise building up to or more than 30 minutes 5 days per week including some aerobic activity    

## 2017-12-11 DIAGNOSIS — E039 Hypothyroidism, unspecified: Secondary | ICD-10-CM | POA: Diagnosis not present

## 2017-12-13 ENCOUNTER — Telehealth: Payer: Self-pay | Admitting: Family Medicine

## 2017-12-13 NOTE — Telephone Encounter (Signed)
Copied from [place] [phone] [phone]. Topic: Quick Communication - See Telephone Encounter >> Dec 13, 2017 11:40 AM Neva Seat wrote: Pt returned to work on June 13th.  Please put this on the paperwork for pt's work. Fax paper to the fax number on the paper.

## 2017-12-13 NOTE — Telephone Encounter (Signed)
I don't have active paperwork for her on my desk-is there some waiting?

## 2017-12-13 NOTE — Telephone Encounter (Signed)
I have the paperwork Dr. Glori Bickers had completed it she just didn't have the return to work day. I filled it out and faxed it to fax # on paper and sent it to scanning

## 2017-12-18 DIAGNOSIS — E1165 Type 2 diabetes mellitus with hyperglycemia: Secondary | ICD-10-CM | POA: Diagnosis not present

## 2017-12-20 DIAGNOSIS — I69398 Other sequelae of cerebral infarction: Secondary | ICD-10-CM | POA: Diagnosis not present

## 2017-12-20 DIAGNOSIS — I10 Essential (primary) hypertension: Secondary | ICD-10-CM | POA: Diagnosis not present

## 2017-12-20 DIAGNOSIS — E118 Type 2 diabetes mellitus with unspecified complications: Secondary | ICD-10-CM | POA: Diagnosis not present

## 2017-12-25 ENCOUNTER — Telehealth: Payer: Self-pay | Admitting: Orthopaedic Surgery

## 2017-12-25 ENCOUNTER — Other Ambulatory Visit: Payer: Self-pay | Admitting: Cardiovascular Disease

## 2017-12-25 NOTE — Telephone Encounter (Signed)
Patient requests refill on Hydrocodone/Acetaminophen 5-325 mgs.   Qty  50   Sig: Take 1 tablet by mouth every 6 (six) hours as needed for moderate pain (Must last 14 days).  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

## 2017-12-26 MED ORDER — HYDROCODONE-ACETAMINOPHEN 5-325 MG PO TABS: ORAL_TABLET | ORAL | 0 refills | Status: DC

## 2018-01-03 DIAGNOSIS — H25811 Combined forms of age-related cataract, right eye: Secondary | ICD-10-CM | POA: Diagnosis not present

## 2018-01-03 DIAGNOSIS — H25812 Combined forms of age-related cataract, left eye: Secondary | ICD-10-CM | POA: Diagnosis not present

## 2018-01-03 DIAGNOSIS — E113512 Type 2 diabetes mellitus with proliferative diabetic retinopathy with macular edema, left eye: Secondary | ICD-10-CM | POA: Diagnosis not present

## 2018-01-10 ENCOUNTER — Ambulatory Visit: Payer: Self-pay | Admitting: Cardiovascular Disease

## 2018-01-11 ENCOUNTER — Encounter: Payer: Self-pay | Admitting: Cardiovascular Disease

## 2018-01-12 DIAGNOSIS — E113591 Type 2 diabetes mellitus with proliferative diabetic retinopathy without macular edema, right eye: Secondary | ICD-10-CM | POA: Diagnosis not present

## 2018-01-12 DIAGNOSIS — H2513 Age-related nuclear cataract, bilateral: Secondary | ICD-10-CM | POA: Diagnosis not present

## 2018-01-12 DIAGNOSIS — H4312 Vitreous hemorrhage, left eye: Secondary | ICD-10-CM | POA: Diagnosis not present

## 2018-01-12 DIAGNOSIS — E113592 Type 2 diabetes mellitus with proliferative diabetic retinopathy without macular edema, left eye: Secondary | ICD-10-CM | POA: Diagnosis not present

## 2018-01-17 DIAGNOSIS — H25812 Combined forms of age-related cataract, left eye: Secondary | ICD-10-CM | POA: Diagnosis not present

## 2018-01-17 DIAGNOSIS — H2512 Age-related nuclear cataract, left eye: Secondary | ICD-10-CM | POA: Diagnosis not present

## 2018-01-26 DIAGNOSIS — H2511 Age-related nuclear cataract, right eye: Secondary | ICD-10-CM | POA: Diagnosis not present

## 2018-01-31 DIAGNOSIS — H25811 Combined forms of age-related cataract, right eye: Secondary | ICD-10-CM | POA: Diagnosis not present

## 2018-01-31 DIAGNOSIS — H2511 Age-related nuclear cataract, right eye: Secondary | ICD-10-CM | POA: Diagnosis not present

## 2018-02-06 ENCOUNTER — Ambulatory Visit: Payer: 59 | Admitting: Orthopaedic Surgery

## 2018-02-06 ENCOUNTER — Encounter: Payer: Self-pay | Admitting: Orthopaedic Surgery

## 2018-02-06 VITALS — BP 152/72 | HR 81 | Ht 67.0 in | Wt 235.0 lb

## 2018-02-06 DIAGNOSIS — M25561 Pain in right knee: Secondary | ICD-10-CM | POA: Diagnosis not present

## 2018-02-06 DIAGNOSIS — G8929 Other chronic pain: Secondary | ICD-10-CM | POA: Diagnosis not present

## 2018-02-06 MED ORDER — HYDROCODONE-ACETAMINOPHEN 5-325 MG PO TABS: ORAL_TABLET | ORAL | 0 refills | Status: DC

## 2018-02-06 NOTE — Progress Notes (Signed)
CC:  I have pain of my right knee. I would like an injection.  The patient has chronic pain of the right knee.  There is no recent trauma.  There is no redness.  Injections in the past have helped.  The knee has no redness, has an effusion and crepitus present.  ROM of the right knee is 0-100.  Impression:  Chronic knee pain right  Return: 3 months  PROCEDURE NOTE:  The patient requests injections of the right knee , verbal consent was obtained.  The right knee was prepped appropriately after time out was performed.   Sterile technique was observed and injection of 1 cc of Depo-Medrol 40 mg with several cc's of plain xylocaine. Anesthesia was provided by ethyl chloride and a 20-gauge needle was used to inject the knee area. The injection was tolerated well.  A band aid dressing was applied.  The patient was advised to apply ice later today and tomorrow to the injection sight as needed.  I have reviewed the [place] web site prior to prescribing narcotic medicine for this patient.  Electronically Signed Sanjuana Kava, MD 8/27/20199:45 AM

## 2018-02-13 DIAGNOSIS — H4312 Vitreous hemorrhage, left eye: Secondary | ICD-10-CM | POA: Diagnosis not present

## 2018-02-13 DIAGNOSIS — E113591 Type 2 diabetes mellitus with proliferative diabetic retinopathy without macular edema, right eye: Secondary | ICD-10-CM | POA: Diagnosis not present

## 2018-02-13 DIAGNOSIS — E113512 Type 2 diabetes mellitus with proliferative diabetic retinopathy with macular edema, left eye: Secondary | ICD-10-CM | POA: Diagnosis not present

## 2018-02-24 ENCOUNTER — Other Ambulatory Visit: Payer: Self-pay | Admitting: Cardiovascular Disease

## 2018-02-28 DIAGNOSIS — E113511 Type 2 diabetes mellitus with proliferative diabetic retinopathy with macular edema, right eye: Secondary | ICD-10-CM | POA: Diagnosis not present

## 2018-02-28 DIAGNOSIS — H359 Unspecified retinal disorder: Secondary | ICD-10-CM | POA: Diagnosis not present

## 2018-02-28 DIAGNOSIS — E113512 Type 2 diabetes mellitus with proliferative diabetic retinopathy with macular edema, left eye: Secondary | ICD-10-CM | POA: Diagnosis not present

## 2018-02-28 LAB — HM DIABETES EYE EXAM

## 2018-03-01 ENCOUNTER — Encounter: Payer: Self-pay | Admitting: Family Medicine

## 2018-03-12 DIAGNOSIS — E1165 Type 2 diabetes mellitus with hyperglycemia: Secondary | ICD-10-CM | POA: Diagnosis not present

## 2018-03-15 ENCOUNTER — Encounter: Payer: Self-pay | Admitting: Cardiology

## 2018-03-15 ENCOUNTER — Telehealth: Payer: Self-pay

## 2018-03-15 ENCOUNTER — Ambulatory Visit: Payer: 59 | Admitting: Cardiology

## 2018-03-15 DIAGNOSIS — I2510 Atherosclerotic heart disease of native coronary artery without angina pectoris: Secondary | ICD-10-CM

## 2018-03-15 DIAGNOSIS — I639 Cerebral infarction, unspecified: Secondary | ICD-10-CM | POA: Diagnosis not present

## 2018-03-15 DIAGNOSIS — E119 Type 2 diabetes mellitus without complications: Secondary | ICD-10-CM | POA: Diagnosis not present

## 2018-03-15 DIAGNOSIS — I779 Disorder of arteries and arterioles, unspecified: Secondary | ICD-10-CM | POA: Insufficient documentation

## 2018-03-15 DIAGNOSIS — IMO0001 Reserved for inherently not codable concepts without codable children: Secondary | ICD-10-CM

## 2018-03-15 DIAGNOSIS — I6523 Occlusion and stenosis of bilateral carotid arteries: Secondary | ICD-10-CM

## 2018-03-15 DIAGNOSIS — I10 Essential (primary) hypertension: Secondary | ICD-10-CM

## 2018-03-15 DIAGNOSIS — E669 Obesity, unspecified: Secondary | ICD-10-CM

## 2018-03-15 DIAGNOSIS — Z794 Long term (current) use of insulin: Secondary | ICD-10-CM

## 2018-03-15 DIAGNOSIS — Z9861 Coronary angioplasty status: Secondary | ICD-10-CM

## 2018-03-15 DIAGNOSIS — F17200 Nicotine dependence, unspecified, uncomplicated: Secondary | ICD-10-CM

## 2018-03-15 DIAGNOSIS — I739 Peripheral vascular disease, unspecified: Secondary | ICD-10-CM

## 2018-03-15 MED ORDER — TICAGRELOR 60 MG PO TABS: 60.0000 mg | ORAL_TABLET | Freq: Two times a day (BID) | ORAL | 0 refills | Status: DC

## 2018-03-15 MED ORDER — ATORVASTATIN CALCIUM 80 MG PO TABS: 80.0000 mg | ORAL_TABLET | Freq: Every day | ORAL | 2 refills | Status: DC

## 2018-03-15 MED ORDER — LISINOPRIL 20 MG PO TABS: 20.0000 mg | ORAL_TABLET | Freq: Every day | ORAL | 2 refills | Status: DC

## 2018-03-15 MED ORDER — FUROSEMIDE 20 MG PO TABS: 20.0000 mg | ORAL_TABLET | Freq: Every day | ORAL | 2 refills | Status: DC

## 2018-03-15 MED ORDER — NITROGLYCERIN 0.4 MG SL SUBL: 0.4000 mg | SUBLINGUAL_TABLET | SUBLINGUAL | 3 refills | Status: DC | PRN

## 2018-03-15 MED ORDER — CARVEDILOL 12.5 MG PO TABS: 12.5000 mg | ORAL_TABLET | Freq: Two times a day (BID) | ORAL | 2 refills | Status: DC

## 2018-03-15 NOTE — Progress Notes (Signed)
03/15/2018 Jennifer [name]   Aug 12, 1955  [id number]  Primary Physician [name], [name], MD Primary Cardiologist: Dr Audelia Acton but patient would like her care transferred to [facility]  HPI:  Pleasant 62 y/o married obese female with a history of CAD-s/p NSTEMI treated with an RCA stent in 2017. She had residual 70% Dx1, 99% OM2 ,Other medical problems include HTN with normal LVF by echo June 2019, IDDM, and dyslipidemia. She works third shift at the [employer] in [place], she stands thorough most of her shift. She is hoping to retire next year. She was admitted in June 2019 with lacunar stroke. She is followed by Dr Merlene Laughter in [place]. She had an echo then, no TEE or Holter. Dr Merlene Laughter did not feel like her stroke was embolic. Her ASA was increased to 162 mg daily. Recently she saw her opthalmologist who asked her if she had "congestive heart failure".  The pt has never had CHF. She denies chest pain, orthopnea, or palpitations. She has LE edema that's chronic, she wears compression stockings. She is supposed to be on Lasix 40 mg daily but only takes 20 mg a few times a week.    Current Outpatient Medications  Medication Sig Dispense Refill  . aspirin 81 MG EC tablet Take 2 tablets (162 mg total) by mouth daily. 30 tablet 12  . atorvastatin (LIPITOR) 80 MG tablet Take 1 tablet (80 mg total) by mouth daily at 6 PM. 90 tablet 2  . buPROPion (WELLBUTRIN SR) 150 MG 12 hr tablet Take 1 tablet (150 mg total) by mouth 2 (two) times daily. 180 tablet 3  . carvedilol (COREG) 12.5 MG tablet Take 1 tablet (12.5 mg total) by mouth 2 (two) times daily with a meal. 90 tablet 2  . diclofenac (VOLTAREN) 75 MG EC tablet TAKE 1 TABLET BY MOUTH TWICE DAILY WITH A MEAL 60 tablet 5  . DULoxetine (CYMBALTA) 60 MG capsule Take 1 capsule (60 mg total) by mouth daily. 90 capsule 3  . furosemide (LASIX) 20 MG tablet Take 1 tablet (20 mg total) by mouth daily. 90 tablet 2  . HYDROcodone-acetaminophen  (NORCO/VICODIN) 5-325 MG tablet One tablet by mouth every six hours as needed for pain. Must last 30 days 50 tablet 0  . insulin lispro (HUMALOG) 100 UNIT/ML injection Inject 40 Units into the skin daily as needed for high blood sugar.     . levothyroxine (SYNTHROID, LEVOTHROID) 112 MCG tablet Take 112 mcg by mouth every evening.     Marland Kitchen lisinopril (PRINIVIL,ZESTRIL) 20 MG tablet Take 1 tablet (20 mg total) by mouth daily. 90 tablet 2  . nitroGLYCERIN (NITROSTAT) 0.4 MG SL tablet Place 1 tablet (0.4 mg total) under the tongue every 5 (five) minutes x 3 doses as needed for chest pain. 25 tablet 3  . sitaGLIPtin (JANUVIA) 100 MG tablet Take 100 mg by mouth every evening.     . ticagrelor (BRILINTA) 60 MG TABS tablet Take 1 tablet (60 mg total) by mouth 2 (two) times daily. 90 tablet 0   No current facility-administered medications for this visit.     No Known Allergies  Past Medical History:  Diagnosis Date  . CAD (coronary artery disease)    cath 5/23 100% dist RCA lesion treated with 2 overlapping Integrity Resolute DES ( 2.25x58mm, 2.25x37mm), 60% mid RCA treated medically, 99% OM2 not amenable to PCI, 70% D1 lesion, EF normal  . Diabetes mellitus without complication ([place])   . Hypercholesteremia   .  Hypertension   . Thyroid disease     Social History   Socioeconomic History  . Marital status: Married    Spouse name: Not on file  . Number of children: Not on file  . Years of education: Not on file  . Highest education level: Not on file  Occupational History  . Not on file  Social Needs  . Financial resource strain: Not on file  . Food insecurity:    Worry: Not on file    Inability: Not on file  . Transportation needs:    Medical: Not on file    Non-medical: Not on file  Tobacco Use  . Smoking status: Current Some Day Smoker    Packs/day: 0.25    Types: Cigarettes  . Smokeless tobacco: Never Used  Substance and Sexual Activity  . Alcohol use: No    Alcohol/week: 0.0  standard drinks  . Drug use: No  . Sexual activity: Not on file  Lifestyle  . Physical activity:    Days per week: Not on file    Minutes per session: Not on file  . Stress: Not on file  Relationships  . Social connections:    Talks on phone: Not on file    Gets together: Not on file    Attends religious service: Not on file    Active member of club or organization: Not on file    Attends meetings of clubs or organizations: Not on file    Relationship status: Not on file  . Intimate partner violence:    Fear of current or ex partner: Not on file    Emotionally abused: Not on file    Physically abused: Not on file    Forced sexual activity: Not on file  Other Topics Concern  . Not on file  Social History Narrative  . Not on file     Family History  Problem Relation Age of Onset  . Heart disease Mother   . Stroke Sister   . Heart attack Brother      Review of Systems: General: negative for chills, fever, night sweats or weight changes.  Cardiovascular: negative for chest pain, dyspnea on exertion, edema, orthopnea, palpitations, paroxysmal nocturnal dyspnea or shortness of breath Dermatological: negative for rash Respiratory: negative for cough or wheezing Urologic: negative for hematuria Abdominal: negative for nausea, vomiting, diarrhea, bright red blood per rectum, melena, or hematemesis Neurologic: negative for visual changes, syncope, or dizziness All other systems reviewed and are otherwise negative except as noted above.    Blood pressure (!) 160/76, pulse 80, height 5\' 7"  (1.702 m), weight 241 lb 9.6 oz (109.6 kg), SpO2 94 %.  General appearance: alert, cooperative, appears older than stated age, no distress and moderately obese Neck: no carotid bruit and no JVD Lungs: clear to auscultation bilaterally Heart: regular rate and rhythm Extremities: 1+ pitting edema Skin: cool and dry Neurologic: Grossly normal  EKG June 2019-0 [id number]  ASSESSMENT AND PLAN:     Acute CVA (cerebrovascular accident) [facility]) Pt had lacunar CVA June 2019- not felt to be embolic. Followed by Dr Alvino Chapel ASA 162 mg daily.  CAD S/P percutaneous coronary angioplasty NSTEMI May 2017 treated with RCA DES  Essential hypertension Normal LVF on echo- repeat B/P by me 138/70 Increase Lasix to 20 mg daily  Insulin dependent diabetes mellitus ([place]) Pt has an insulin pump. She denies neuropathy, renal function was normal in June  Obesity (BMI 30-39.9) BMI 37, she says she has lost 40 lbs  in the last year.  TOBACCO ABUSE 1 PPD  Carotid artery disease (HCC) Mild- 1-49% bilateral al ICA stenosis June 2019   PLAN  Increase Lasix to 20 mg daily. I reassured her I did not think she had congestive heart failure. She would like to f/u in [place] and this will be arranged if OK with Dr Audelia Acton. I'll also ask Dr Audelia Acton about continuing her Brilinta.   Kerin Ransom PA-C 03/15/2018 9:29 AM

## 2018-03-15 NOTE — Telephone Encounter (Signed)
Left message for patient to contact office unable to leave detailed message no dpr on file. Sent [employer].

## 2018-03-15 NOTE — Assessment & Plan Note (Signed)
Pt has an insulin pump. She denies neuropathy, renal function was normal in June

## 2018-03-15 NOTE — Assessment & Plan Note (Signed)
Mild- 1-49% bilateral al ICA stenosis June 2019

## 2018-03-15 NOTE — Assessment & Plan Note (Signed)
NSTEMI May 2017 treated with RCA DES

## 2018-03-15 NOTE — Assessment & Plan Note (Signed)
Normal LVF on echo- repeat B/P by me 138/70 Increase Lasix to 20 mg daily

## 2018-03-15 NOTE — Telephone Encounter (Signed)
-----   Message from Erlene Quan, [place] sent at 03/15/2018  2:17 PM EDT ----- Please let the patient know its OK to stop her Velna Ochs PA-C 03/15/2018 2:17 PM

## 2018-03-15 NOTE — Assessment & Plan Note (Signed)
1 PPD 

## 2018-03-15 NOTE — Assessment & Plan Note (Signed)
Pt had lacunar CVA June 2019- not felt to be embolic. Followed by Dr Alvino Chapel ASA 162 mg daily.

## 2018-03-15 NOTE — Assessment & Plan Note (Signed)
BMI 37, she says she has lost 40 lbs in the last year.

## 2018-03-15 NOTE — Patient Instructions (Addendum)
Medication Instructions:  START Lasix 20mg  Take 1 tablet once a day  If you need a refill on your cardiac medications before your next appointment, please call your pharmacy.  Labwork: None  [employer] in our office  Testing/Procedures: None  Follow-Up: Your physician wants you to follow-up in: 6 months with Dr Lindon Romp at the [place] office. You will receive a reminder letter in the mail two months in advance. If you don't receive a letter, please call our office to schedule the follow-up appointment. IF YOU HAVE NOT RECEIVED A LETTER BY February GIVE THE OFFICE A CALL AND MAKE YOUR APPT.  Any Other Special Instructions Will Be Listed Below (If Applicable).

## 2018-03-19 ENCOUNTER — Telehealth: Payer: Self-pay | Admitting: Cardiology

## 2018-03-19 NOTE — Telephone Encounter (Signed)
Follow Up:     Returning Tee's call from Friday.

## 2018-03-19 NOTE — Telephone Encounter (Signed)
Spoke with pt. Adv her of the conversation below. Hello Mrs Manocchio,   My name is Darden Dates and I'm the medical assistant that works with Lurena Joiner. You seen Lurena Joiner in the office yesterday. He spoke Dr Fletcher Anon and it is ok for you to stop taking the Brilinta. Please contact office with any questions or concerns.   Thanks,  Lonie Peak pt to continue her other medications as prescribed. Pt verbalized understanding and voiced appreciation for the call.

## 2018-03-22 ENCOUNTER — Other Ambulatory Visit: Payer: Self-pay | Admitting: Family Medicine

## 2018-03-22 NOTE — Telephone Encounter (Signed)
Spoke to pt and advised OV required since she has not been on recent abx. Appt scheduled for 10/11

## 2018-03-22 NOTE — Telephone Encounter (Signed)
Copied from [place] [id number]. Topic: Quick Communication - Rx Refill/Question >> Mar 22, 2018 10:20 AM Jennifer [name] wrote: Medication: Diflucan  Pt called [name]/c she has a yeast infection and would like pcp to call in the medication above; contact to advise

## 2018-03-23 ENCOUNTER — Encounter: Payer: Self-pay | Admitting: Family Medicine

## 2018-03-23 ENCOUNTER — Ambulatory Visit: Payer: 59 | Admitting: Family Medicine

## 2018-03-23 VITALS — BP 160/70 | HR 85 | Temp 98.2°F | Ht 67.0 in | Wt 237.0 lb

## 2018-03-23 DIAGNOSIS — L304 Erythema intertrigo: Secondary | ICD-10-CM

## 2018-03-23 MED ORDER — FLUCONAZOLE 150 MG PO TABS: 150.0000 mg | ORAL_TABLET | ORAL | 0 refills | Status: DC

## 2018-03-23 NOTE — Progress Notes (Signed)
Subjective:    Patient ID: Jennifer [name], female    DOB: 1956/05/27, 62 y.o.   MRN: [id number]  HPI This is a 62 yo female who presents today with rash. Noticed odor and redness x 5 days around belly button. Has history of infection of abdominal incision following her hysterectomy 30 years ago. Rash is intermittent, has required diflucan in past to clear, but she is out of refills. Has been applying antibiotic cream and using guaze to keep area dry. Has had significant weight loss with diet changes.  Marland Kitchen  Has history of DM, last hgba1c 7.1. Sees Dr. Chalmers Cater. Sugars running 90-120s. Has insulin pump.    Past Medical History:  Diagnosis Date  . CAD (coronary artery disease)    cath 5/23 100% dist RCA lesion treated with 2 overlapping Integrity Resolute DES ( 2.25x35mm, 2.25x61mm), 60% mid RCA treated medically, 99% OM2 not amenable to PCI, 70% D1 lesion, EF normal  . Diabetes mellitus without complication ([place])   . Hypercholesteremia   . Hypertension   . Thyroid disease    Past Surgical History:  Procedure Laterality Date  . ABDOMINAL HYSTERECTOMY    . CARDIAC CATHETERIZATION N/A 11/03/2015   Procedure: Left Heart Cath and Coronary Angiography;  Surgeon: Leonie Man, MD;  Location: [place] [facility];  Service: Cardiovascular;  Laterality: N/A;  . CARDIAC CATHETERIZATION N/A 11/03/2015   Procedure: Coronary Stent Intervention;  Surgeon: Leonie Man, MD;  Location: [place] [facility];  Service: Cardiovascular;  Laterality: N/A;  . CARPAL TUNNEL RELEASE     Family History  Problem Relation Age of Onset  . Heart disease Mother   . Stroke Sister   . Heart attack Brother    Social History   Tobacco Use  . Smoking status: Current Some Day Smoker    Packs/day: 0.25    Types: Cigarettes  . Smokeless tobacco: Never Used  Substance Use Topics  . Alcohol use: No    Alcohol/week: 0.0 standard drinks  . Drug use: No      Review of Systems Per HPI    Objective:   Physical  Exam  Constitutional: She appears well-developed and well-nourished. No distress.  HENT:  Head: Normocephalic and atraumatic.  Eyes: Conjunctivae are normal.  Cardiovascular: Normal rate.  Pulmonary/Chest: Effort normal.  Skin: Skin is warm and dry. Rash noted. She is not diaphoretic.  Abdomen with large pannus. Area around umbilicus with approximately 8 cm, horizontal, oblong area of moist, erythematous papules. No pustular drainage.  Psychiatric: She has a normal mood and affect. Her behavior is normal. Judgment and thought content normal.  Vitals reviewed.   BP (!) 160/70 (BP Location: Right Arm, Patient Position: Sitting, Cuff Size: Large)   Pulse 85   Temp 98.2 F (36.8 C) (Oral)   Ht 5\' 7"  (1.702 m)   Wt 237 lb (107.5 kg)   SpO2 95%   BMI 37.12 kg/m  Wt Readings from Last 3 Encounters:  03/23/18 237 lb (107.5 kg)  03/15/18 241 lb 9.6 oz (109.6 kg)  02/06/18 235 lb (106.6 kg)   BP Readings from Last 3 Encounters:  03/23/18 (!) 160/70  03/15/18 (!) 160/76  02/06/18 (!) 152/72       Assessment & Plan:  1. Intertrigo - Provided written and verbal information regarding diagnosis and treatment. - Given side of involved area, will treat systemically instead of locally - RTC precauations reviewed - she was instructed to keep area clean and dry, can apply  antifungal drying powder - fluconazole (DIFLUCAN) 150 MG tablet; Take 1 tablet (150 mg total) by mouth once a week.  Dispense: 4 tablet; Refill: 0   Clarene Reamer, FNP-BC  [facility]  03/23/2018 10:24 AM

## 2018-03-23 NOTE — Patient Instructions (Signed)
I have sent in Diflucan (generic) for you to take once a week for 4 weeks.  Keep area clean and dry thoroughly- can use hair dryer on cool setting Can use moisture absorbing powder with antifungal like Zeasorb   Intertrigo Intertrigo is skin irritation (inflammation) that happens in warm, moist areas of the body. The irritation can cause a rash and make skin raw and itchy. The rash is usually pink or red. It happens mostly between folds of skin or where skin rubs together, such as:  Toes.  Armpits.  Groin.  Belly.  Breasts.  Buttocks.  This condition is not passed from person to person (is not contagious). Follow these instructions at home:  Keep the affected area clean and dry.  Do not scratch your skin.  Stay cool as much as possible. Use an air conditioner or fan, if you can.  Apply over-the-counter and prescription medicines only as told by your doctor.  If you were prescribed an antibiotic medicine, use it as told by your doctor. Do not stop using the antibiotic even if your condition starts to get better.  Keep all follow-up visits as told by your doctor. This is important. How is this prevented?  Stay at a healthy weight.  Keep your feet dry. This is very important if you have diabetes. Wear cotton or wool socks.  Take care of and protect the skin in your groin and butt area as told by your doctor.  Do not wear tight clothes. Wear clothes that: ? Are loose. ? Take away moisture from your body. ? Are made of cotton.  Wear a bra that gives good support, if needed.  Shower and dry yourself fully after being active.  Keep your blood sugar under control if you have diabetes. Contact a doctor if:  Your symptoms do not get better with treatment.  Your symptoms get worse or they spread.  You notice more redness and warmth.  You have a fever. This information is not intended to replace advice given to you by your health care provider. Make sure you discuss  any questions you have with your health care provider. Document Released: 07/02/2010 Document Revised: 11/05/2015 Document Reviewed: 12/01/2014 Elsevier Interactive Patient Education  [employer].

## 2018-03-28 DIAGNOSIS — H359 Unspecified retinal disorder: Secondary | ICD-10-CM | POA: Diagnosis not present

## 2018-03-28 DIAGNOSIS — E113512 Type 2 diabetes mellitus with proliferative diabetic retinopathy with macular edema, left eye: Secondary | ICD-10-CM | POA: Diagnosis not present

## 2018-03-28 DIAGNOSIS — E113511 Type 2 diabetes mellitus with proliferative diabetic retinopathy with macular edema, right eye: Secondary | ICD-10-CM | POA: Diagnosis not present

## 2018-03-30 ENCOUNTER — Other Ambulatory Visit: Payer: Self-pay | Admitting: Orthopaedic Surgery

## 2018-03-30 NOTE — Telephone Encounter (Signed)
Dr. Brooke Bonito patient  Hydrocodone-Acetaminophen  5/325 mg  Qty 50 Tablets  One tablet by mouth every six hours as needed for pain.  Must last 30 days.  PATIENT USES [facility]

## 2018-04-02 MED ORDER — HYDROCODONE-ACETAMINOPHEN 5-325 MG PO TABS: ORAL_TABLET | ORAL | 0 refills | Status: DC

## 2018-04-18 DIAGNOSIS — I10 Essential (primary) hypertension: Secondary | ICD-10-CM | POA: Diagnosis not present

## 2018-04-18 DIAGNOSIS — E1165 Type 2 diabetes mellitus with hyperglycemia: Secondary | ICD-10-CM | POA: Diagnosis not present

## 2018-04-18 DIAGNOSIS — Z23 Encounter for immunization: Secondary | ICD-10-CM | POA: Diagnosis not present

## 2018-04-18 DIAGNOSIS — N390 Urinary tract infection, site not specified: Secondary | ICD-10-CM | POA: Diagnosis not present

## 2018-04-18 DIAGNOSIS — E039 Hypothyroidism, unspecified: Secondary | ICD-10-CM | POA: Diagnosis not present

## 2018-04-18 DIAGNOSIS — E7800 Pure hypercholesterolemia, unspecified: Secondary | ICD-10-CM | POA: Diagnosis not present

## 2018-05-02 ENCOUNTER — Ambulatory Visit: Payer: 59 | Admitting: Orthopaedic Surgery

## 2018-05-02 ENCOUNTER — Encounter: Payer: Self-pay | Admitting: Orthopaedic Surgery

## 2018-05-02 VITALS — BP 182/83 | HR 79 | Ht 67.0 in | Wt 246.0 lb

## 2018-05-02 DIAGNOSIS — M25561 Pain in right knee: Secondary | ICD-10-CM

## 2018-05-02 DIAGNOSIS — G8929 Other chronic pain: Secondary | ICD-10-CM

## 2018-05-02 DIAGNOSIS — F17210 Nicotine dependence, cigarettes, uncomplicated: Secondary | ICD-10-CM

## 2018-05-02 MED ORDER — DICLOFENAC SODIUM 1 % TD GEL: 4.0000 g | Freq: Four times a day (QID) | TRANSDERMAL | 5 refills | Status: DC

## 2018-05-02 NOTE — Progress Notes (Signed)
CC:  I have pain of my left knee. I would like an injection.  The patient has chronic pain of the left knee.  There is no recent trauma.  There is no redness.  Injections in the past have helped.  The knee has no redness, has an effusion and crepitus present.  ROM of the left knee is 0-105.  Impression:  Chronic knee pain left  Return: 3 months  PROCEDURE NOTE:  The patient requests injections of the left knee, verbal consent was obtained.  The left knee was prepped appropriately after time out was performed.   Sterile technique was observed and injection of 1 cc of Depo-Medrol 40 mg with several cc's of plain xylocaine. Anesthesia was provided by ethyl chloride and a 20-gauge needle was used to inject the knee area. The injection was tolerated well.  A band aid dressing was applied.  The patient was advised to apply ice later today and tomorrow to the injection sight as needed.  Electronically Signed Sanjuana Kava, MD 11/20/20198:28 AM

## 2018-05-08 ENCOUNTER — Ambulatory Visit: Payer: 59 | Admitting: Orthopaedic Surgery

## 2018-05-28 ENCOUNTER — Telehealth: Payer: Self-pay | Admitting: Orthopaedic Surgery

## 2018-05-28 NOTE — Telephone Encounter (Signed)
Hydrocodone-Acetaminophen  5/325 mg  Qty  50 Tablets  PATIENT USES [facility]

## 2018-05-29 MED ORDER — HYDROCODONE-ACETAMINOPHEN 5-325 MG PO TABS: ORAL_TABLET | ORAL | 0 refills | Status: DC

## 2018-06-05 DIAGNOSIS — E1165 Type 2 diabetes mellitus with hyperglycemia: Secondary | ICD-10-CM | POA: Diagnosis not present

## 2018-06-12 DIAGNOSIS — H4312 Vitreous hemorrhage, left eye: Secondary | ICD-10-CM | POA: Diagnosis not present

## 2018-06-12 DIAGNOSIS — E113512 Type 2 diabetes mellitus with proliferative diabetic retinopathy with macular edema, left eye: Secondary | ICD-10-CM | POA: Diagnosis not present

## 2018-06-12 DIAGNOSIS — E113511 Type 2 diabetes mellitus with proliferative diabetic retinopathy with macular edema, right eye: Secondary | ICD-10-CM | POA: Diagnosis not present

## 2018-06-14 ENCOUNTER — Telehealth: Payer: Self-pay

## 2018-06-14 NOTE — Telephone Encounter (Signed)
Take 1/2 to 1 more lisinopril  I have diclofenac on he med list- if she takes this, do not take the advil   If situation worsens- do seek care  I am not in the office and will likely be back on line until after 5  Thanks

## 2018-06-14 NOTE — Telephone Encounter (Signed)
Pt was at work last night and was seen by nurse at work at [employer] this morning at that time BP 170/90 and then rest 167/90. Pt had h/a and dizziness earlier this morning and pt took Advil 400mg  and now no H/A or dizziness. Pt does not have way to take BP at home. No CP or SOB. Pt takes lisinopril 20 mg one daily; pt has not missed any pills. There are no available appts today. Pt wants to know if she could take another BP pill today and schedule appt with Dr Glori Bickers 06/15/18. Pt declined another LB site for an appt today. pts daughter is a Marine scientist and will ck pts BP later today. If pts BP goes up or has CP, SOB, H/A or dizziness prior to appt pt will go to [facility]. FYI to Dr Glori Bickers.pt scheduled appt with Dr Glori Bickers on 06/15/18 at 9:15.

## 2018-06-14 NOTE — Telephone Encounter (Signed)
Pt notified of Dr. Marliss Coots instructions and verbalized understanding. Pt wasn't aware she couldn't take advil and diclofenac so she will not do that again will only take one or the other

## 2018-06-15 ENCOUNTER — Encounter: Payer: Self-pay | Admitting: Family Medicine

## 2018-06-15 ENCOUNTER — Ambulatory Visit: Payer: 59 | Admitting: Family Medicine

## 2018-06-15 VITALS — BP 158/90 | HR 82 | Temp 98.3°F | Ht 67.0 in | Wt 245.8 lb

## 2018-06-15 DIAGNOSIS — E119 Type 2 diabetes mellitus without complications: Secondary | ICD-10-CM

## 2018-06-15 DIAGNOSIS — I10 Essential (primary) hypertension: Secondary | ICD-10-CM | POA: Diagnosis not present

## 2018-06-15 DIAGNOSIS — J449 Chronic obstructive pulmonary disease, unspecified: Secondary | ICD-10-CM

## 2018-06-15 DIAGNOSIS — E669 Obesity, unspecified: Secondary | ICD-10-CM | POA: Diagnosis not present

## 2018-06-15 DIAGNOSIS — Z794 Long term (current) use of insulin: Secondary | ICD-10-CM

## 2018-06-15 DIAGNOSIS — E11319 Type 2 diabetes mellitus with unspecified diabetic retinopathy without macular edema: Secondary | ICD-10-CM

## 2018-06-15 DIAGNOSIS — I6523 Occlusion and stenosis of bilateral carotid arteries: Secondary | ICD-10-CM

## 2018-06-15 DIAGNOSIS — F17200 Nicotine dependence, unspecified, uncomplicated: Secondary | ICD-10-CM | POA: Diagnosis not present

## 2018-06-15 DIAGNOSIS — I639 Cerebral infarction, unspecified: Secondary | ICD-10-CM

## 2018-06-15 DIAGNOSIS — IMO0001 Reserved for inherently not codable concepts without codable children: Secondary | ICD-10-CM

## 2018-06-15 MED ORDER — LISINOPRIL 40 MG PO TABS: 40.0000 mg | ORAL_TABLET | Freq: Every day | ORAL | 3 refills | Status: DC

## 2018-06-15 NOTE — Progress Notes (Signed)
Subjective:    Patient ID: Jennifer [name], female    DOB: 1955-08-22, 63 y.o.   MRN: [id number]  HPI Here for headache/dizziness-now resolved  Wanted to check BP  Wt Readings from Last 3 Encounters:  06/15/18 245 lb 12 oz (111.5 kg)  05/02/18 246 lb (111.6 kg)  03/23/18 237 lb (107.5 kg)   38.49 kg/m   Smoking status - she smokes less than 10 cig per day  This has gone down  Not ready to quit   193/87 last night- sent home from home from work  Was dizzy the night before - did not feel well - systolic also 106Y  Headaches for about 3 weeks   No changes in habits or medicines    BP Readings from Last 3 Encounters:  06/15/18 (!) 158/90  05/02/18 (!) 182/83  03/23/18 (!) 160/70   Coreg 12.5 mg bid Lasix 20 mg daily  Lisinopril 20 mg daily   Eating salsa and chips  Not eating candy  A1C is 7-doing well  No time for exercise-but will retire in Nov   Pulse Readings from Last 3 Encounters:  06/15/18 82  05/02/18 79  03/23/18 85   Leg swelling is the same  No sob or chest pain   H/o CAD and carotid dz and CVA in the past  Also COPD   Lab Results  Component Value Date   CREATININE 0.96 11/15/2017   BUN 13 11/15/2017   NA 143 11/15/2017   K 4.1 11/15/2017   CL 105 11/15/2017   CO2 32 11/15/2017   Patient Active Problem List   Diagnosis Date Noted  . Carotid artery disease ([place]) 03/15/2018  . Acute CVA (cerebrovascular accident) ([place]) 11/16/2017  . Rash and nonspecific skin eruption 11/09/2017  . Low back pain 10/26/2017  . Screening mammogram, encounter for 09/18/2017  . CAD S/P percutaneous coronary angioplasty 11/04/2015  . NSTEMI (non-ST elevated myocardial infarction) ([place]) 11/03/2015  . Hypothyroidism 07/06/2010  . Hyperlipidemia LDL goal <100 08/28/2008  . Essential hypertension 11/13/2007  . Insulin dependent diabetes mellitus ([place]) 09/21/2006  . DIABETIC PERIPHERAL NEUROPATHY 09/21/2006  . Obesity (BMI 30-39.9) 09/21/2006  . TOBACCO ABUSE  09/21/2006  . Adjustment disorder with mixed anxiety and depressed mood 09/21/2006  . CARPAL TUNNEL SYNDROME 09/21/2006  . COPD (chronic obstructive pulmonary disease) ([place]) 09/21/2006  . EDEMA 09/21/2006  . MIGRAINES, HX OF 09/21/2006   Past Medical History:  Diagnosis Date  . CAD (coronary artery disease)    cath 5/23 100% dist RCA lesion treated with 2 overlapping Integrity Resolute DES ( 2.25x76mm, 2.25x82mm), 60% mid RCA treated medically, 99% OM2 not amenable to PCI, 70% D1 lesion, EF normal  . Diabetes mellitus without complication ([place])   . Hypercholesteremia   . Hypertension   . Thyroid disease    Past Surgical History:  Procedure Laterality Date  . ABDOMINAL HYSTERECTOMY    . CARDIAC CATHETERIZATION N/A 11/03/2015   Procedure: Left Heart Cath and Coronary Angiography;  Surgeon: Leonie Man, MD;  Location: [facility];  Service: Cardiovascular;  Laterality: N/A;  . CARDIAC CATHETERIZATION N/A 11/03/2015   Procedure: Coronary Stent Intervention;  Surgeon: Leonie Man, MD;  Location: [facility];  Service: Cardiovascular;  Laterality: N/A;  . CARPAL TUNNEL RELEASE     Social History   Tobacco Use  . Smoking status: Current Some Day Smoker    Packs/day: 0.25    Types: Cigarettes  . Smokeless tobacco: Never Used  Substance Use Topics  . Alcohol use: No    Alcohol/week: 0.0 standard drinks  . Drug use: No   Family History  Problem Relation Age of Onset  . Heart disease Mother   . Stroke Sister   . Heart attack Brother    No Known Allergies Current Outpatient Medications on File Prior to Visit  Medication Sig Dispense Refill  . aspirin 81 MG EC tablet Take 2 tablets (162 mg total) by mouth daily. 30 tablet 12  . atorvastatin (LIPITOR) 80 MG tablet Take 1 tablet (80 mg total) by mouth daily at 6 PM. 90 tablet 2  . buPROPion (WELLBUTRIN SR) 150 MG 12 hr tablet Take 1 tablet (150 mg total) by mouth 2 (two) times daily. 180 tablet 3  . carvedilol  (COREG) 12.5 MG tablet Take 1 tablet (12.5 mg total) by mouth 2 (two) times daily with a meal. 90 tablet 2  . diclofenac (VOLTAREN) 75 MG EC tablet TAKE 1 TABLET BY MOUTH TWICE DAILY WITH A MEAL 60 tablet 5  . diclofenac sodium (VOLTAREN) 1 % GEL Apply 4 g topically 4 (four) times daily. 5 Tube 5  . DULoxetine (CYMBALTA) 60 MG capsule Take 1 capsule (60 mg total) by mouth daily. 90 capsule 3  . fluconazole (DIFLUCAN) 150 MG tablet Take 1 tablet (150 mg total) by mouth once a week. 4 tablet 0  . furosemide (LASIX) 20 MG tablet Take 1 tablet (20 mg total) by mouth daily. 90 tablet 2  . HYDROcodone-acetaminophen (NORCO/VICODIN) 5-325 MG tablet One tablet by mouth every six hours as needed for pain. Must last 30 days 50 tablet 0  . insulin lispro (HUMALOG) 100 UNIT/ML injection Inject 40 Units into the skin daily as needed for high blood sugar.     . levothyroxine (SYNTHROID, LEVOTHROID) 112 MCG tablet Take 112 mcg by mouth every evening.     . nitroGLYCERIN (NITROSTAT) 0.4 MG SL tablet Place 1 tablet (0.4 mg total) under the tongue every 5 (five) minutes x 3 doses as needed for chest pain. 25 tablet 3  . sitaGLIPtin (JANUVIA) 100 MG tablet Take 100 mg by mouth every evening.      No current facility-administered medications on file prior to visit.     Review of Systems  Constitutional: Positive for fatigue. Negative for activity change, appetite change, fever and unexpected weight change.  HENT: Negative for congestion, ear pain, rhinorrhea, sinus pressure and sore throat.   Eyes: Negative for pain, redness and visual disturbance.  Respiratory: Negative for cough, shortness of breath and wheezing.        No sob today  Cardiovascular: Negative for chest pain and palpitations.  Gastrointestinal: Negative for abdominal pain, blood in stool, constipation and diarrhea.  Endocrine: Negative for polydipsia and polyuria.  Genitourinary: Negative for dysuria, frequency and urgency.  Musculoskeletal:  Positive for arthralgias. Negative for back pain and myalgias.  Skin: Negative for pallor and rash.  Allergic/Immunologic: Negative for environmental allergies.  Neurological: Positive for headaches. Negative for dizziness and syncope.       Dizziness is resolved HA is improved   Hematological: Negative for adenopathy. Does not bruise/bleed easily.  Psychiatric/Behavioral: Negative for decreased concentration and dysphoric mood. The patient is not nervous/anxious.        Objective:   Physical Exam Constitutional:      General: She is not in acute distress.    Appearance: She is well-developed. She is obese. She is not ill-appearing.  HENT:  Head: Normocephalic and atraumatic.  Eyes:     Extraocular Movements:     Right eye: No nystagmus.     Left eye: No nystagmus.     Conjunctiva/sclera: Conjunctivae normal.     Pupils: Pupils are equal, round, and reactive to light.  Neck:     Musculoskeletal: Normal range of motion and neck supple.     Thyroid: No thyromegaly.     Vascular: No carotid bruit or JVD.  Cardiovascular:     Rate and Rhythm: Normal rate and regular rhythm.     Heart sounds: Normal heart sounds. No gallop.   Pulmonary:     Effort: Pulmonary effort is normal. No respiratory distress.     Breath sounds: Normal breath sounds. No wheezing or rales.     Comments: No crackles Diffusely distant bs  No wheezing Abdominal:     General: Bowel sounds are normal. There is no distension or abdominal bruit.     Palpations: Abdomen is soft. There is no mass.     Tenderness: There is no abdominal tenderness.  Lymphadenopathy:     Cervical: No cervical adenopathy.  Skin:    General: Skin is warm and dry.     Findings: No rash.  Neurological:     Mental Status: She is alert.     Cranial Nerves: No cranial nerve deficit, dysarthria or facial asymmetry.     Sensory: No sensory deficit.     Motor: No weakness.     Coordination: Romberg sign negative. Coordination  normal.     Gait: Gait normal.     Deep Tendon Reflexes: Reflexes are normal and symmetric. Reflexes normal.  Psychiatric:        Mood and Affect: Mood normal.           Assessment & Plan:   Problem List Items Addressed This Visit      Cardiovascular and Mediastinum   Essential hypertension - Primary (Chronic)    Inc in BP in pt with known cardiac /carotid dz and CVA BP: (!) 158/90    This has trended up lately  Will increase lisinopril to 40 mg daily  Continue coreg and lasix Stressed imp of low sodium /DASH diet  Watch bp at work  Alert [place] if HA/dizziness return (reassuring exam today)  F/u in 2 weeks         Relevant Medications   lisinopril (PRINIVIL,ZESTRIL) 40 MG tablet   Carotid artery disease (HCC)    Mild stenosis in June No symptoms       Relevant Medications   lisinopril (PRINIVIL,ZESTRIL) 40 MG tablet   Acute CVA (cerebrovascular accident) ([place])    No re occurrence  No change in neuro exam today Need to control bp      Relevant Medications   lisinopril (PRINIVIL,ZESTRIL) 40 MG tablet     Respiratory   COPD (chronic obstructive pulmonary disease) (HCC) (Chronic)     Endocrine   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, with long-term current use of insulin ([place])    Sees endocrinology Has insulin pump  Per pt doing well lately       Relevant Medications   lisinopril (PRINIVIL,ZESTRIL) 40 MG tablet     Other   TOBACCO ABUSE (Chronic)    Has cut back to 10 cig daily  Disc in detail risks of smoking and possible outcomes including copd, vascular/ heart disease, cancer , respiratory and sinus infections  Pt voices understanding Not  ready to quit      Obesity (BMI 30-39.9)    Discussed how this problem influences overall health and the risks it imposes  Reviewed plan for weight loss with lower calorie diet (via better food choices and also portion control or program like weight watchers) and  exercise building up to or more than 30 minutes 5 days per week including some aerobic activity   Fitness would improve bp

## 2018-06-15 NOTE — Patient Instructions (Signed)
Go up on your lisinopril to 40 mg once daily   If any side effects let me know Try to watch your sodium intake Keep thinking about smoking cessation   If headache worsens or dizziness returns- alert [place]   See how BP is at work - if needed we can add something   Follow up in about 2 months

## 2018-06-16 ENCOUNTER — Other Ambulatory Visit: Payer: Self-pay

## 2018-06-16 ENCOUNTER — Emergency Department (HOSPITAL_COMMUNITY): Payer: 59

## 2018-06-16 ENCOUNTER — Emergency Department (HOSPITAL_COMMUNITY)
Admission: EM | Admit: 2018-06-16 | Discharge: 2018-06-16 | Disposition: A | Discharge status: Home / Self Care | Payer: 59 | Attending: Emergency Medicine | Admitting: Emergency Medicine

## 2018-06-16 ENCOUNTER — Encounter (HOSPITAL_COMMUNITY): Payer: Self-pay | Admitting: Emergency Medicine

## 2018-06-16 DIAGNOSIS — Z7984 Long term (current) use of oral hypoglycemic drugs: Secondary | ICD-10-CM | POA: Insufficient documentation

## 2018-06-16 DIAGNOSIS — F17210 Nicotine dependence, cigarettes, uncomplicated: Secondary | ICD-10-CM | POA: Diagnosis not present

## 2018-06-16 DIAGNOSIS — I160 Hypertensive urgency: Secondary | ICD-10-CM | POA: Diagnosis not present

## 2018-06-16 DIAGNOSIS — R519 Headache, unspecified: Secondary | ICD-10-CM

## 2018-06-16 DIAGNOSIS — I2510 Atherosclerotic heart disease of native coronary artery without angina pectoris: Secondary | ICD-10-CM | POA: Diagnosis not present

## 2018-06-16 DIAGNOSIS — E119 Type 2 diabetes mellitus without complications: Secondary | ICD-10-CM | POA: Insufficient documentation

## 2018-06-16 DIAGNOSIS — R51 Headache: Secondary | ICD-10-CM | POA: Insufficient documentation

## 2018-06-16 DIAGNOSIS — E039 Hypothyroidism, unspecified: Secondary | ICD-10-CM | POA: Diagnosis not present

## 2018-06-16 DIAGNOSIS — Z79899 Other long term (current) drug therapy: Secondary | ICD-10-CM | POA: Diagnosis not present

## 2018-06-16 DIAGNOSIS — Z7982 Long term (current) use of aspirin: Secondary | ICD-10-CM | POA: Diagnosis not present

## 2018-06-16 DIAGNOSIS — I10 Essential (primary) hypertension: Secondary | ICD-10-CM | POA: Insufficient documentation

## 2018-06-16 DIAGNOSIS — J449 Chronic obstructive pulmonary disease, unspecified: Secondary | ICD-10-CM | POA: Diagnosis not present

## 2018-06-16 LAB — I-STAT CHEM 8, ED
BUN: 17 mg/dL (ref 8–23)
Calcium, Ion: 1.06 mmol/L — ABNORMAL LOW (ref 1.15–1.40)
Chloride: 104 mmol/L (ref 98–111)
Creatinine, Ser: 0.8 mg/dL (ref 0.44–1.00)
Glucose, Bld: 116 mg/dL — ABNORMAL HIGH (ref 70–99)
HCT: 34 % — ABNORMAL LOW (ref 36.0–46.0)
Hemoglobin: 11.6 g/dL — ABNORMAL LOW (ref 12.0–15.0)
Potassium: 4.8 mmol/L (ref 3.5–5.1)
Sodium: 139 mmol/L (ref 135–145)
TCO2: 32 mmol/L (ref 22–32)

## 2018-06-16 MED ORDER — SODIUM CHLORIDE 0.9 % IV BOLUS
1000.0000 mL | Freq: Once | INTRAVENOUS | Status: AC
  Administered 2018-06-16: 1000 mL via INTRAVENOUS

## 2018-06-16 MED ORDER — AMLODIPINE BESYLATE 10 MG PO TABS: 10.0000 mg | ORAL_TABLET | Freq: Every day | ORAL | 0 refills | Status: DC

## 2018-06-16 MED ORDER — METOCLOPRAMIDE HCL 5 MG/ML IJ SOLN
10.0000 mg | Freq: Once | INTRAMUSCULAR | Status: AC
  Administered 2018-06-16: 10 mg via INTRAVENOUS
  Filled 2018-06-16: qty 2

## 2018-06-16 MED ORDER — AMLODIPINE BESYLATE 5 MG PO TABS
10.0000 mg | ORAL_TABLET | Freq: Once | ORAL | Status: AC
  Administered 2018-06-16: 10 mg via ORAL
  Filled 2018-06-16: qty 2

## 2018-06-16 MED ORDER — DIPHENHYDRAMINE HCL 50 MG/ML IJ SOLN
25.0000 mg | Freq: Once | INTRAMUSCULAR | Status: AC
  Administered 2018-06-16: 25 mg via INTRAVENOUS
  Filled 2018-06-16: qty 1

## 2018-06-16 MED ORDER — KETOROLAC TROMETHAMINE 30 MG/ML IJ SOLN
30.0000 mg | Freq: Once | INTRAMUSCULAR | Status: AC
  Administered 2018-06-16: 30 mg via INTRAVENOUS
  Filled 2018-06-16: qty 1

## 2018-06-16 NOTE — ED Triage Notes (Addendum)
Pt states that her BP readings at home have been high at home. Pt C/O headache X 3 weeks.

## 2018-06-16 NOTE — ED Provider Notes (Signed)
[facility] EMERGENCY DEPARTMENT Provider Note   CSN: [id number] Arrival date & time: 06/16/18  1850     History   Chief Complaint Chief Complaint  Patient presents with  . Hypertension    HPI Jennifer [name] is a 63 y.o. female with a history of DM, CAD with stent in 2017, hypertension presenting to the emergency department today for hypertension x 4 days and headache x 3 weeks. She was at work 4 days ago and her headache was bothering her so she had her blood pressure checked by the nurse and was told it was elevated. The next day she had it rechecked and was again told it was high. She followed up with her pcp yesterday and her Lisinopril dose was increased from 20 mg to 40 mg.    Her headache has been constant for 3 weeks. She describes it as a pressure located in her forehead above bilateral eyes. She has taken a hydrocodone today for the pain and it did not help. This headache feels like ones she has had in the past except this one has lasted longer. Rates the pain 6 out of 10. Admits to photophobia and intermittent nausea.  Denies visual changes, chest pain, abdominal pain, fever, neck pain, vomiting.  Past Medical History:  Diagnosis Date  . CAD (coronary artery disease)    cath 5/23 100% dist RCA lesion treated with 2 overlapping Integrity Resolute DES ( 2.25x45mm, 2.25x2mm), 60% mid RCA treated medically, 99% OM2 not amenable to PCI, 70% D1 lesion, EF normal  . Diabetes mellitus without complication ([place])   . Hypercholesteremia   . Hypertension   . Thyroid disease     Patient Active Problem List   Diagnosis Date Noted  . Carotid artery disease ([place]) 03/15/2018  . Acute CVA (cerebrovascular accident) ([place]) 11/16/2017  . Rash and nonspecific skin eruption 11/09/2017  . Low back pain 10/26/2017  . Screening mammogram, encounter for 09/18/2017  . CAD S/P percutaneous coronary angioplasty 11/04/2015  . NSTEMI (non-ST elevated myocardial infarction) ([place]) 11/03/2015  .  Hypothyroidism 07/06/2010  . Hyperlipidemia LDL goal <100 08/28/2008  . Essential hypertension 11/13/2007  . 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 DIABETIC PERIPHERAL NEUROPATHY 09/21/2006  . Obesity (BMI 30-39.9) 09/21/2006  . TOBACCO ABUSE 09/21/2006  . Adjustment disorder with mixed anxiety and depressed mood 09/21/2006  . CARPAL TUNNEL SYNDROME 09/21/2006  . COPD (chronic obstructive pulmonary disease) ([place]) 09/21/2006  . EDEMA 09/21/2006  . MIGRAINES, HX OF 09/21/2006    Past Surgical History:  Procedure Laterality Date  . ABDOMINAL HYSTERECTOMY    . CARDIAC CATHETERIZATION N/A 11/03/2015   Procedure: Left Heart Cath and Coronary Angiography;  Surgeon: Leonie Man, MD;  Location: [facility];  Service: Cardiovascular;  Laterality: N/A;  . CARDIAC CATHETERIZATION N/A 11/03/2015   Procedure: Coronary Stent Intervention;  Surgeon: Leonie Man, MD;  Location: [facility];  Service: Cardiovascular;  Laterality: N/A;  . CARPAL TUNNEL RELEASE       OB History   No obstetric history on file.      Home Medications    Prior to Admission medications   Medication Sig Start Date End Date Taking? Authorizing Provider  aspirin 81 MG EC tablet Take 2 tablets (162 mg total) by mouth daily. 11/16/17  Yes Johnson, Clanford L, MD  atorvastatin (LIPITOR) 80 MG tablet Take 1 tablet (80 mg  total) by mouth daily at 6 PM. Patient taking differently: Take 80 mg by mouth every morning.  03/15/18  Yes Kilroy, Luke K, PA-C  buPROPion (WELLBUTRIN SR) 150 MG 12 hr tablet Take 1 tablet (150 mg total) by mouth 2 (two) times daily. 09/18/17  Yes [name], [name], MD  carvedilol (COREG) 12.5 MG tablet Take 1 tablet (12.5 mg total) by mouth 2 (two) times daily with a meal. Patient taking differently: Take 12.5 mg by mouth every morning.  03/15/18  Yes Kilroy, Luke K, PA-C  diclofenac  (VOLTAREN) 75 MG EC tablet TAKE 1 TABLET BY MOUTH TWICE DAILY WITH A MEAL Patient taking differently: Take 75 mg by mouth 2 (two) times daily.  10/26/17  Yes Sanjuana Kava, MD  DULoxetine (CYMBALTA) 60 MG capsule Take 1 capsule (60 mg total) by mouth daily. Patient taking differently: Take 60 mg by mouth every morning.  09/18/17  Yes [name], [name], MD  furosemide (LASIX) 20 MG tablet Take 1 tablet (20 mg total) by mouth daily. Patient taking differently: Take 20 mg by mouth every other day.  03/15/18  Yes Kilroy, Doreene Burke, PA-C  HYDROcodone-acetaminophen (NORCO/VICODIN) 5-325 MG tablet One tablet by mouth every six hours as needed for pain. Must last 30 days Patient taking differently: Take 1 tablet by mouth every 6 (six) hours as needed for moderate pain or severe pain. One tablet by mouth every six hours as needed for pain. Must last 30 days 05/29/18  Yes Sanjuana Kava, MD  insulin lispro (HUMALOG) 100 UNIT/ML injection Inject 40 Units into the skin daily. Via pump   Yes [provider]  levothyroxine (SYNTHROID, LEVOTHROID) 112 MCG tablet Take 112 mcg by mouth daily before breakfast.    Yes [provider]  lisinopril (PRINIVIL,ZESTRIL) 40 MG tablet Take 1 tablet (40 mg total) by mouth daily. 06/15/18  Yes [name], [name], MD  nitroGLYCERIN (NITROSTAT) 0.4 MG SL tablet Place 1 tablet (0.4 mg total) under the tongue every 5 (five) minutes x 3 doses as needed for chest pain. 03/15/18  Yes Kilroy, Luke K, PA-C  sitaGLIPtin (JANUVIA) 100 MG tablet Take 100 mg by mouth daily.    Yes [provider]  amLODipine (NORVASC) 10 MG tablet Take 1 tablet (10 mg total) by mouth daily. 06/16/18 07/16/18  Albrizze, Kaitlyn E, PA-C  diclofenac sodium (VOLTAREN) 1 % GEL Apply 4 g topically 4 (four) times daily. Patient not taking: Reported on 06/16/2018 05/02/18   Sanjuana Kava, MD  fluconazole (DIFLUCAN) 150 MG tablet Take 1 tablet (150 mg total) by mouth once a week. Patient not taking: Reported  on 06/16/2018 03/23/18   Elby Beck, FNP    Family History Family History  Problem Relation Age of Onset  . Heart disease Mother   . Stroke Sister   . Heart attack Brother     Social History Social History   Tobacco Use  . Smoking status: Current Some Day Smoker    Packs/day: 0.25    Types: Cigarettes  . Smokeless tobacco: Never Used  Substance Use Topics  . Alcohol use: No    Alcohol/week: 0.0 standard drinks  . Drug use: No     Allergies   Patient has no known allergies.   Review of Systems Review of Systems  Constitutional: Negative for chills and fever.  HENT: Negative for congestion, sinus pressure and sore throat.   Eyes: Positive for photophobia. Negative for pain and visual disturbance.  Respiratory: Negative for chest tightness and  shortness of breath.   Cardiovascular: Negative for chest pain and palpitations.  Gastrointestinal: Positive for nausea. Negative for abdominal pain, diarrhea and vomiting.  Genitourinary: Negative for difficulty urinating and hematuria.  Musculoskeletal: Negative for back pain and neck pain.  Skin: Negative for rash and wound.  Neurological: Positive for headaches. Negative for dizziness, seizures, syncope, weakness and numbness.     Physical Exam Updated Vital Signs BP (!) 189/82   Pulse 74   Temp 98.1 F (36.7 C) (Oral)   Resp 18   Ht 5\' 7"  (1.702 m)   Wt 111.1 kg   SpO2 95%   BMI 38.37 kg/m   Physical Exam Vitals signs and nursing note reviewed.  Constitutional:      Appearance: She is not ill-appearing or toxic-appearing.  HENT:     Head: Normocephalic and atraumatic.     Nose: Nose normal.     Mouth/Throat:     Mouth: Mucous membranes are moist.     Pharynx: Oropharynx is clear.  Eyes:     Conjunctiva/sclera: Conjunctivae normal.  Neck:     Musculoskeletal: Normal range of motion.  Cardiovascular:     Rate and Rhythm: Normal rate and regular rhythm.     Pulses: Normal pulses.     Heart sounds:  Normal heart sounds.  Pulmonary:     Effort: Pulmonary effort is normal.     Breath sounds: Normal breath sounds.  Abdominal:     General: There is no distension.     Palpations: Abdomen is soft.     Tenderness: There is no abdominal tenderness. There is no guarding or rebound.  Musculoskeletal: Normal range of motion.  Skin:    General: Skin is warm and dry.     Capillary Refill: Capillary refill takes less than 2 seconds.  Neurological:     Mental Status: She is alert. Mental status is at baseline.     Motor: No weakness.     Comments: Speech is clear and goal oriented, follows commands Cranial nerves 3-12 intact, no facial droop Normal strength in upper and lower extremities bilaterally including dorsiflexion and plantar flexion, strong and equal grip strength Sensation normal to light touch Moves extremities without ataxia, coordination intact Normal finger to nose  Normal gait and balance   Psychiatric:        Mood and Affect: Mood normal.        Behavior: Behavior normal.        Judgment: Judgment normal.      ED Treatments / Results  Labs (all labs ordered are listed, but only abnormal results are displayed) Labs Reviewed  I-STAT CHEM 8, ED - Abnormal; Notable for the following components:      Result Value   Glucose, Bld 116 (*)    Calcium, Ion 1.06 (*)    Hemoglobin 11.6 (*)    HCT 34.0 (*)    All other components within normal limits    EKG EKG Interpretation  Date/Time:  Saturday June 16 2018 20:41:20 EST Ventricular Rate:  68 PR Interval:    QRS Duration: 100 QT Interval:  459 QTC Calculation: 489 R Axis:   80 Text Interpretation:  Sinus rhythm Consider left atrial enlargement Nonspecific T abnormalities, lateral leads Borderline prolonged QT interval unchanged from prior Confirmed by Noemi Chapel [phone]) on 06/16/2018 9:02:39 PM   Radiology Ct Head Wo Contrast  Result Date: 06/16/2018 CLINICAL DATA:  Subacute onset of high blood pressure and  headache. EXAM: CT HEAD WITHOUT CONTRAST TECHNIQUE:  Contiguous axial images were obtained from the base of the skull through the vertex without intravenous contrast. COMPARISON:  CT of the head and MRI of the brain performed 11/14/2017 FINDINGS: Brain: No evidence of acute infarction, hemorrhage, hydrocephalus, extra-axial collection or mass lesion / mass effect. Scattered periventricular and subcortical white matter change likely reflects small vessel ischemic microangiopathy. A small chronic infarct is noted at the right basal ganglia. A small chronic infarct is seen at the left cerebellar hemisphere. The brainstem and fourth ventricle are within normal limits. The cerebral hemispheres demonstrate grossly normal gray-white differentiation. No mass effect or midline shift is seen. Vascular: No hyperdense vessel or unexpected calcification. Skull: There is no evidence of fracture; visualized osseous structures are unremarkable in appearance. Sinuses/Orbits: The orbits are within normal limits. The paranasal sinuses and mastoid air cells are well-aerated. Other: No significant soft tissue abnormalities are seen. IMPRESSION: 1. No acute intracranial pathology seen on CT. 2. Scattered small vessel ischemic microangiopathy. 3. Small chronic infarcts at the right basal ganglia and left cerebellar hemisphere. Electronically Signed   By: Garald Balding M.D.   On: 06/16/2018 21:16    Procedures Procedures (including critical care time)  Medications Ordered in ED Medications  sodium chloride 0.9 % bolus 1,000 mL (0 mLs Intravenous Stopped 06/16/18 2209)  metoCLOPramide (REGLAN) injection 10 mg (10 mg Intravenous Given 06/16/18 2039)  diphenhydrAMINE (BENADRYL) injection 25 mg (25 mg Intravenous Given 06/16/18 2039)  ketorolac (TORADOL) 30 MG/ML injection 30 mg (30 mg Intravenous Given 06/16/18 2209)  amLODipine (NORVASC) tablet 10 mg (10 mg Oral Given 06/16/18 2209)     Initial Impression / Assessment and Plan / ED  Course  I have reviewed the triage vital signs and the nursing notes.  Pertinent labs & imaging results that were available during my care of the patient were reviewed by me and considered in my medical decision making (see chart for details).   Pt's lisinopril was increased only yesterday from 20 to 40mg .She also takes carvedilol 12.5mg  BID. With the multiple elevated blood pressure readings during this visit, pt given dose of amlodipine 10 mg and prescription at discharge. Head Ct without contrast was negative for acute findings. EKG with sinus rhythm, possible left atrial enlargement, borderline prolonged QTC and is unchanged from prior. Lab work today shows normal renal function.  Pt HA treated and improved while in ED.  Presentation is like pts typical HA and non concerning for [facility], ICH, Meningitis, or temporal arteritis. Pt is afebrile with no focal neuro deficits, nuchal rigidity, or change in vision. No signs of end organ damage.  Pt verbalizes understanding and is agreeable with plan to dc.Strict return precautions given and follow up with pcp discussed.   The patient was discussed with and seen by Dr. Sabra Heck who agrees with the treatment plan.  Vitals:   06/16/18 2120 06/16/18 2130 06/16/18 2200 06/16/18 2219  BP: (!) 192/76 (!) 194/80 (!) 195/79   Pulse: 75 74 74   Resp: 14 17 18    Temp: 98.2 F (36.8 C)   98.2 F (36.8 C)  TempSrc: Oral   Oral  SpO2: 96% 94% 97%   Weight:      Height:         Final Clinical Impressions(s) / ED Diagnoses   Final diagnoses:  Hypertensive urgency  Acute nonintractable headache, unspecified headache type    ED Discharge Orders         Ordered    amLODipine (NORVASC) 10 MG tablet  Daily     06/16/18 2158           Flint Melter 06/17/18 1714    Noemi Chapel, MD 06/17/18 [phone]

## 2018-06-16 NOTE — Assessment & Plan Note (Signed)
Has cut back to 10 cig daily  Disc in detail risks of smoking and possible outcomes including copd, vascular/ heart disease, cancer , respiratory and sinus infections  Pt voices understanding Not ready to quit

## 2018-06-16 NOTE — Assessment & Plan Note (Signed)
Mild stenosis in June No symptoms

## 2018-06-16 NOTE — Assessment & Plan Note (Signed)
No re occurrence  No change in neuro exam today Need to control bp

## 2018-06-16 NOTE — Discharge Instructions (Signed)
Please follow up with primary care doctor in 2-5 days. I have sent a prescription for amlodipine 10 mg to the pharmacy. Please take it as prescribed. It is to help control your blood pressure, but will need to be managed long term by your primary care doctor.  Your lab work today was reassuring that your kidneys are doing well. Your BUN/Cr was within normal limits.   Please return to the emergency department for new or worsening symptoms to include chest pain, shortness of breath, visual changes, a headache that is not controlled with Tylenol or ibuprofen.   Headache Your lab results showed no acute abnormalities.  For future headaches please try the following regimen: Antiinflammatory medications: Take 600 mg of ibuprofen every 6 hours or 440 mg (over the counter dose) to 500 mg (prescription dose) of naproxen every 12 hours.  Use this regimen until headache subsides for up to 3 days .  After this time, these medications may be used as needed for pain. Take these medications with food to avoid upset stomach. Choose only one of these medications, do not take them together. Tylenol: Should you continue to have additional pain while taking the ibuprofen or naproxen, you may add in tylenol as needed. Your daily total maximum amount of tylenol from all sources should be limited to 4000mg /day for persons without liver problems, or 2000mg /day for those with liver problems.  Hydration: Have a goal of about a half liter of water every couple hours to stay well hydrated.   Sleep: Please be sure to get plenty of sleep with a goal of 8 hours per night. Having a regular bed time and bedtime routine can help with this.  Screens: Reduce the amount of time you are in front of screens.  Take about a 5-10-minute break every hour or every couple hours to give your eyes rest.  Do not use screens in dark rooms.  Glasses with a blue light filter may also help reduce eye fatigue.   Return to the emergency room for  worsening symptoms.

## 2018-06-16 NOTE — ED Provider Notes (Signed)
Medical screening examination/treatment/procedure(s) were conducted as a shared visit with non-physician practitioner(s) and myself.  I personally evaluated the patient during the encounter.  Clinical Impression:   Final diagnoses:  Hypertensive urgency  Acute nonintractable headache, unspecified headache type    The patient is a 63 year old female with known hypertension as well as a known history of heart disease, she has been developing increasing headaches over the last 3 weeks which is been rather persistent, they are located on the top of the head and she states it feels like "the top of my head is going to pop off".  It is not associated with weakness numbness or changes in mental status or gait or coordination.  She denies chest pain or shortness of breath at this time.  She has been having increasing blood pressures over the last week as well and has noted that she saw her doctor yesterday, had her lisinopril increased from 20 to 40 mg a day and continues to take carvedilol.  Her daughter bought her a blood pressure cuff for her wrist and when it was elevated today she was concerned that she may need to be further treated that she came to the hospital.  At this time she is asymptomatic except for a mild headache.  There is no visual changes, she has normal coordination with all 4 extremities, she has normal speech and no facial droop.  Heart and lung exams are unremarkable without ectopy or murmurs.  EKG unremarkable, CT scan of the head will be ordered as well as a basic metabolic panel to evaluate renal function.  The patient is agreeable.   EKG Interpretation  Date/Time:  Saturday June 16 2018 20:41:20 EST Ventricular Rate:  68 PR Interval:    QRS Duration: 100 QT Interval:  459 QTC Calculation: 489 R Axis:   80 Text Interpretation:  Sinus rhythm Consider left atrial enlargement Nonspecific T abnormalities, lateral leads Borderline prolonged QT interval unchanged from prior  Confirmed by Noemi Chapel [phone]) on 06/16/2018 9:02:39 PM         Noemi Chapel, MD 06/17/18 1801

## 2018-06-16 NOTE — Assessment & Plan Note (Signed)
Inc in BP in pt with known cardiac /carotid dz and CVA BP: (!) 158/90    This has trended up lately  Will increase lisinopril to 40 mg daily  Continue coreg and lasix Stressed imp of low sodium /DASH diet  Watch bp at work  Alert [place] if HA/dizziness return (reassuring exam today)  F/u in 2 weeks

## 2018-06-16 NOTE — Assessment & Plan Note (Signed)
Sees endocrinology Has insulin pump  Per pt doing well lately

## 2018-06-16 NOTE — Assessment & Plan Note (Signed)
Discussed how this problem influences overall health and the risks it imposes  Reviewed plan for weight loss with lower calorie diet (via better food choices and also portion control or program like weight watchers) and exercise building up to or more than 30 minutes 5 days per week including some aerobic activity   Fitness would improve bp

## 2018-06-18 DIAGNOSIS — I69398 Other sequelae of cerebral infarction: Secondary | ICD-10-CM | POA: Diagnosis not present

## 2018-06-18 DIAGNOSIS — E118 Type 2 diabetes mellitus with unspecified complications: Secondary | ICD-10-CM | POA: Diagnosis not present

## 2018-06-18 DIAGNOSIS — I10 Essential (primary) hypertension: Secondary | ICD-10-CM | POA: Diagnosis not present

## 2018-06-20 DIAGNOSIS — E113512 Type 2 diabetes mellitus with proliferative diabetic retinopathy with macular edema, left eye: Secondary | ICD-10-CM | POA: Diagnosis not present

## 2018-06-20 DIAGNOSIS — E113592 Type 2 diabetes mellitus with proliferative diabetic retinopathy without macular edema, left eye: Secondary | ICD-10-CM | POA: Diagnosis not present

## 2018-06-20 DIAGNOSIS — H4312 Vitreous hemorrhage, left eye: Secondary | ICD-10-CM | POA: Diagnosis not present

## 2018-06-28 DIAGNOSIS — E113511 Type 2 diabetes mellitus with proliferative diabetic retinopathy with macular edema, right eye: Secondary | ICD-10-CM | POA: Diagnosis not present

## 2018-06-28 DIAGNOSIS — E113512 Type 2 diabetes mellitus with proliferative diabetic retinopathy with macular edema, left eye: Secondary | ICD-10-CM | POA: Diagnosis not present

## 2018-08-01 ENCOUNTER — Encounter: Payer: Self-pay | Admitting: Orthopaedic Surgery

## 2018-08-01 ENCOUNTER — Ambulatory Visit (INDEPENDENT_AMBULATORY_CARE_PROVIDER_SITE_OTHER): Payer: 59

## 2018-08-01 ENCOUNTER — Ambulatory Visit: Payer: 59 | Admitting: Orthopaedic Surgery

## 2018-08-01 VITALS — BP 182/80 | HR 78 | Ht 67.0 in | Wt 240.0 lb

## 2018-08-01 DIAGNOSIS — M1711 Unilateral primary osteoarthritis, right knee: Secondary | ICD-10-CM

## 2018-08-01 MED ORDER — HYDROCODONE-ACETAMINOPHEN 5-325 MG PO TABS: ORAL_TABLET | ORAL | 0 refills | Status: DC

## 2018-08-01 NOTE — Progress Notes (Signed)
Patient Jennifer [name], female DOB:07-Aug-1955, 63 y.o. HGD:[id number]  Chief Complaint  Patient presents with  . Knee Pain    Right    HPI  Jennifer [name] is a 63 y.o. female who has chronic pain of the right knee that is getting much worse. She has giving way now, more swelling and much more pain.  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  She has no trauma.  She has popping.  She has no redness.  The injection did not help that much. She is taking pain medicine that helps.     Body mass index is 37.59 kg/m.  ROS  Review of Systems  Constitutional:       She is current smoker  HENT: Negative for congestion.   Respiratory: Positive for shortness of breath. Negative for cough.   Cardiovascular: Negative for chest pain and leg swelling.  Endocrine: Positive for cold intolerance.  Musculoskeletal: Positive for arthralgias, gait problem and joint swelling.  Allergic/Immunologic: Positive for environmental allergies.  Neurological: Positive for numbness and headaches.       Peripheral neruopathy  All other systems reviewed and are negative.   All other systems reviewed and are negative.  The following is a summary of the past history medically, past history surgically, known current medicines, social history and family history.  This information is gathered electronically by the computer from prior information and documentation.  I review this each visit and have found including this information at this point in the chart is beneficial and informative.    Past Medical History:  Diagnosis Date  . CAD (coronary artery disease)    cath 5/23 100% dist RCA lesion treated with 2 overlapping Integrity Resolute DES ( 2.25x55mm, 2.25x43mm), 60% mid RCA treated medically, 99% OM2 not amenable to PCI, 70% D1 lesion, EF normal  . Diabetes mellitus without complication ([place])   . Hypercholesteremia   . Hypertension   . Thyroid disease     Past Surgical History:  Procedure Laterality Date   . ABDOMINAL HYSTERECTOMY    . CARDIAC CATHETERIZATION N/A 11/03/2015   Procedure: Left Heart Cath and Coronary Angiography;  Surgeon: Leonie Man, MD;  Location: [facility];  Service: Cardiovascular;  Laterality: N/A;  . CARDIAC CATHETERIZATION N/A 11/03/2015   Procedure: Coronary Stent Intervention;  Surgeon: Leonie Man, MD;  Location: [facility];  Service: Cardiovascular;  Laterality: N/A;  . CARPAL TUNNEL RELEASE      Family History  Problem Relation Age of Onset  . Heart disease Mother   . Stroke Sister   . Heart attack Brother     Social History Social History   Tobacco Use  . Smoking status: Current Some Day Smoker    Packs/day: 0.25    Types: Cigarettes  . Smokeless tobacco: Never Used  Substance Use Topics  . Alcohol use: No    Alcohol/week: 0.0 standard drinks  . Drug use: No    No Known Allergies  Current Outpatient Medications  Medication Sig Dispense Refill  . amLODipine (NORVASC) 10 MG tablet Take 1 tablet (10 mg total) by mouth daily. 30 tablet 0  . aspirin 81 MG EC tablet Take 2 tablets (162 mg total) by mouth daily. 30 tablet 12  . atorvastatin (LIPITOR) 80 MG tablet Take 1 tablet (80 mg total) by mouth daily at 6 PM. (Patient taking differently: Take 80 mg by mouth every morning. ) 90 tablet 2  . buPROPion [facility] SR) 150  MG 12 hr tablet Take 1 tablet (150 mg total) by mouth 2 (two) times daily. 180 tablet 3  . carvedilol (COREG) 12.5 MG tablet Take 1 tablet (12.5 mg total) by mouth 2 (two) times daily with a meal. (Patient taking differently: Take 12.5 mg by mouth every morning. ) 90 tablet 2  . diclofenac (VOLTAREN) 75 MG EC tablet TAKE 1 TABLET BY MOUTH TWICE DAILY WITH A MEAL (Patient taking differently: Take 75 mg by mouth 2 (two) times daily. ) 60 tablet 5  . diclofenac sodium (VOLTAREN) 1 % GEL Apply 4 g topically 4 (four) times daily. (Patient not taking: Reported on 06/16/2018) 5 Tube 5  . DULoxetine (CYMBALTA) 60 MG capsule  Take 1 capsule (60 mg total) by mouth daily. (Patient taking differently: Take 60 mg by mouth every morning. ) 90 capsule 3  . fluconazole (DIFLUCAN) 150 MG tablet Take 1 tablet (150 mg total) by mouth once a week. (Patient not taking: Reported on 06/16/2018) 4 tablet 0  . furosemide (LASIX) 20 MG tablet Take 1 tablet (20 mg total) by mouth daily. (Patient taking differently: Take 20 mg by mouth every other day. ) 90 tablet 2  . HYDROcodone-acetaminophen (NORCO/VICODIN) 5-325 MG tablet One tablet by mouth every six hours as needed for pain. Must last 30 days 60 tablet 0  . insulin lispro (HUMALOG) 100 UNIT/ML injection Inject 40 Units into the skin daily. Via pump    . levothyroxine (SYNTHROID, LEVOTHROID) 112 MCG tablet Take 112 mcg by mouth daily before breakfast.     . lisinopril (PRINIVIL,ZESTRIL) 40 MG tablet Take 1 tablet (40 mg total) by mouth daily. 90 tablet 3  . nitroGLYCERIN (NITROSTAT) 0.4 MG SL tablet Place 1 tablet (0.4 mg total) under the tongue every 5 (five) minutes x 3 doses as needed for chest pain. 25 tablet 3  . sitaGLIPtin (JANUVIA) 100 MG tablet Take 100 mg by mouth daily.      No current facility-administered medications for this visit.      Physical Exam  Blood pressure (!) 182/80, pulse 78, height 5\' 7"  (1.702 m), weight 240 lb (108.9 kg).  Constitutional: overall normal hygiene, normal nutrition, well developed, normal grooming, normal body habitus. Assistive device:none  Musculoskeletal: gait and station Limp right, muscle tone and strength are normal, no tremors or atrophy is present.  .  Neurological: coordination overall normal.  Deep tendon reflex/nerve stretch intact.  Sensation normal.  Cranial nerves II-XII intact.   Skin:   Normal overall no scars, lesions, ulcers or rashes. No psoriasis.  Psychiatric: Alert and oriented x 3.  Recent memory intact, remote memory unclear.  Normal mood and affect. Well groomed.  Good eye contact.  Cardiovascular: overall  no swelling, no varicosities, no edema bilaterally, normal temperatures of the legs and arms, no clubbing, cyanosis and good capillary refill.  Lymphatic: palpation is normal.  Right knee with effusion, ROM 0 to 105 with crepitus, limp to the right, knock knee deformity, pain lateral joint line.  NV intact.  All other systems reviewed and are negative   The patient has been educated about the nature of the problem(s) and counseled on treatment options.  The patient appeared to understand what I have discussed and is in agreement with it.  Encounter Diagnosis  Name Primary?  . Primary osteoarthritis of right knee Yes   X-rays were done of the right knee, reported separately.  She has severe DJD of the knee.  PLAN Call if any problems.  Precautions  discussed.  Continue current medications.   I have recommended a total knee arthroplasty. I have explained the procedure to her. She feels she needs to do something now.  I will have her see Dr. Aline Brochure to discuss surgery.  She is agreeable to this.  Return to clinic to see Dr. Aline Brochure for possible total knee.   Electronically Signed Sanjuana Kava, MD 2/19/20209:30 AM

## 2018-08-15 ENCOUNTER — Ambulatory Visit: Payer: Self-pay | Admitting: Family Medicine

## 2018-08-15 ENCOUNTER — Encounter: Payer: Self-pay | Admitting: Orthopedic Surgery

## 2018-08-15 ENCOUNTER — Ambulatory Visit: Payer: 59 | Admitting: Orthopedic Surgery

## 2018-08-15 ENCOUNTER — Other Ambulatory Visit: Payer: Self-pay

## 2018-08-15 VITALS — BP 195/105 | HR 79 | Ht 67.0 in | Wt 235.0 lb

## 2018-08-15 DIAGNOSIS — M1710 Unilateral primary osteoarthritis, unspecified knee: Secondary | ICD-10-CM

## 2018-08-15 NOTE — Patient Instructions (Signed)
Preparing for Knee Replacement Getting prepared before knee replacement surgery can make your recovery easier and more comfortable. This document provides some tips and guidelines that will help you prepare for your surgery. Talk with your health care provider so you can learn what to expect before, during, and after surgery. Ask questions if you do not understand something. To ease concerns about your financial responsibilities, call your insurance company as soon as you decide to have surgery. Ask how much of your surgery and hospital stay will be covered. Also ask about coverage for medical equipment, rehabilitation facilities, and home care. How should I arrange for help? In the first couple weeks after surgery, it will likely be harder for you to do some of your regular activities. You may get tired easily, and you will have limited movement in your leg. Follow these guidelines to make sure you have all the help you need after your surgery:  Plan to have someone take you home from the hospital. Your health care provider will tell you how many days you can expect to be in the hospital.  Cancel all your work, caregiving, and volunteer responsibilities for at least 4-6 weeks after your surgery.  Plan to have someone stay with you day and night for the first week. This person should be someone you are comfortable with. You may need this person to help you with your exercises and personal care, such as bathing and using the toilet.  If you live alone, arrange for someone to take care of your home and pets for the first 4-6 weeks after surgery.  Arrange for drivers to take you to and from follow-up appointments, the grocery store, and other places you may need to go for at least 4-6 weeks.  Consider applying for a disabled parking permit. To get an application, contact the Department of Motor Vehicles or your health care provider's office. How should I prepare my home?      Pick a recovery  spot, but do not plan on recovering in bed. Sitting upright is better for your health. You may want to use a recliner with a small table nearby. Place the items you use most frequently on that table. These may include the TV remote, a cordless phone, a cell phone, a book or laptop computer, and a water glass.  To see if you will be able to move around in your home with a walker, hold your hands out about 6 inches (15 cm) from your sides, and walk from your recovery spot to your kitchen and bathroom. Then walk from your bed to the bathroom. If you do not hit anything with your hands, you will have enough room for a walker.  Minimize the use of stairs after you return home to reduce your risk of falling or tripping.  Remove all clutter from your floors. Also remove any throw rugs. This will help you avoid tripping after your surgery.  Move the items you use most often in your kitchen, bathroom, and bedroom to shelves and drawers that are at countertop height.  Prepare a few meals to freeze and reheat later.  Consider getting safety equipment that will be helpful during your recovery, such as: ? Grab bars added in the shower and near the toilet. ? A raised toilet seat to help you get on and off the toilet more easily. ? A tub or shower bench. How should I prepare my body?  Have a preoperative exam. ? During the exam, your health  care provider will make sure that your body is healthy enough to safely have the surgery. ? When you go to the exam, bring a complete list of all your medicines and supplements, including herbs and vitamins. ? You may need to have additional tests to ensure your safety.  Have elective dental care and routine cleanings done before your surgery. Germs from anywhere in your body, including your mouth, can travel to your new joint and infect it. It is important that you do not have any dental work done for at least 3 months after your surgery.  Maintain a healthy diet. Do  not change your diet before surgery unless your health care provider tells you to do that.  Do not use any products that contain nicotine or tobacco, such as cigarettes and e-cigarettes. These can delay bone healing after surgery. If you need help quitting, ask your health care provider.  Tell your health care provider if: ? You develop any skin infections or skin irritations. You may need to improve the condition of your skin before surgery. ? You have a fever, a cold, or any other illness in the week before your surgery.  Do not drink any alcohol for at least 48 hours before surgery.  The day before your surgery, follow instructions from your health care provider about showering, eating, drinking, and taking medicines. These directions are for your safety.  Talk to your health care provider about doing exercises before your surgery. ? Be sure to follow the exercise program only as directed by your health care provider. ? Doing these exercises in the weeks before your surgery may help reduce pain and improve function after surgery. Summary  Getting prepared before knee replacement surgery can make your recovery easier and more comfortable.  Prepare your home and arrange for help at home.  Keep all of your preoperative appointments to ensure that you are ready for your surgery.  Plan to have someone take you home from the hospital and stay with you day and night for the first week. This information is not intended to replace advice given to you by your health care provider. Make sure you discuss any questions you have with your health care provider. Document Released: 09/03/2010 Document Revised: 07/17/2017 Document Reviewed: 07/17/2017 Elsevier Interactive Patient Education  2019 [employer].  Total Knee Replacement  Total knee replacement is a surgery to replace your knee joint with a man-made (prosthetic) joint. It may be made of plastic, metal, or ceramic parts. The man-made joint  is called a prosthesis. It replaces parts of the thigh bone (femur), lower leg bone (tibia), and kneecap (patella). This surgery is done to lessen pain and improve knee movement. What happens before the procedure? Staying hydrated Follow instructions from your doctor about hydration, which may include:  Up to 2 hours before the procedure - you may continue to drink clear liquids, such as water, clear fruit juice, black coffee, and plain tea. Eating and drinking restrictions Follow instructions from your doctor about eating and drinking, which may include:  8 hours before the procedure - stop eating heavy meals or foods such as meat, fried foods, or fatty foods.  6 hours before the procedure - stop eating light meals or foods, such as toast or cereal.  6 hours before the procedure - stop drinking milk or drinks that contain milk.  2 hours before the procedure - stop drinking clear liquids. Medicines  Ask your doctor about: ? Changing or stopping your normal medicines.  This is important if you take diabetes medicines or blood thinners. ? Taking medicines such as aspirin and ibuprofen. These medicines can thin your blood. Do not take these medicines before your procedure if your doctor tells you not to.  You may be given antibiotic medicine to help prevent infection. General instructions  Get all dental care that you need done before your procedure. Plan to not have dental work done for 3 months after your surgery.  Ask your doctor how your surgical site will be marked or identified.  If your doctor prescribes physical therapy, do exercises as told.  Do not use any tobacco products, such as cigarettes, chewing tobacco, or e-cigarettes. If you need help quitting, ask your doctor.  You may have a physical exam.  You may have tests, such as: ? X-rays. ? MRI. ? CT scan. ? Bone scans.  You may have a blood or urine sample taken.  Plan to have someone take you home after the  procedure.  If you will be going home right after the procedure, plan to have someone with you for at least 24 hours. It is best to have someone help care for you for at least 4-6 weeks after surgery. What happens during the procedure?  To reduce your risk of infection: ? Your health care team will wash or sanitize their hands. ? Your skin will be washed with soap.  An IV tube will be put into one of your veins.  You will be given one or more of the following: ? Sedative. This is a medicine that makes you relaxed. ? Local anesthetic. This is a medicine to numb the area. ? General anesthetic. This is a medicine that makes you fall asleep. ? Spinal anesthetic. This is a medicine that numbs your body below the waist. ? Regional anesthetic. This is a medicine that numbs everything below the injection site.  A cut (incision) will be made in your knee.  Damaged parts of your thigh bone, lower leg bone, and kneecap will be removed.  A piece of material (liner) will be placed on your thigh bone. Material will be placed on your lower leg bone and the underside of your kneecap.  One or more small tubes (drains) may be placed near your cut to help drain fluid.  Your cut will be closed with stitches (sutures), skin glue, or skin tape (adhesive) strips. Medicine may be put on your cut.  A bandage (dressing) will be placed over your cut. The procedure may vary among doctors and hospitals. What happens after the procedure?  Your blood pressure, heart rate, breathing rate, and blood oxygen level will be monitored often until the medicines you were given have worn off.  You may continue to get fluids and medicines through an IV tube.  You will have some pain. There will be medicines to help you.  You may have fluid coming from a drain.  You may have to wear special socks (compression stockings). These help to prevent blood clots and reduce swelling in your legs.  You will be told to move  around as much as possible.  You may be given a continuous passive motion machine to use at home. You will be shown how to use this machine.  Do not drive for 24 hours if you received a sedative. This information is not intended to replace advice given to you by your health care provider. Make sure you discuss any questions you have with your health care provider. Document  Released: 08/22/2011 Document Revised: 07/15/2016 Document Reviewed: 05/06/2015 Elsevier Interactive Patient Education  [employer].

## 2018-08-15 NOTE — Progress Notes (Signed)
PREOP CONSULT/REFERRAL INTRA-OFFICE FROM DR Tyrone Apple   Chief Complaint  Patient presents with  . Knee Pain    right/ surgical consult Dr Luna Glasgow     63 year old female smoker with history of coronary artery disease status post stent placement, diabetic, hypertension Hypothyroid works in the tobacco industry standing 8 hours/day presents with chronic right knee pain with prior treatment including but not limited to Voltaren gel Voltaren p.o. Vicodin.  She has activity limiting pain in her right knee primarily in the lateral joint line associated with swelling occasional giving way episodes decreased range of motion and difficulty with activities of daily living such as climbing kneeling squatting sitting and standing up  Cardiologist is at [facility]  Primary care doctor is Dr. Jerilynn Mages. Tow[place]  History of postop wound infection after hysterectomy requiring repeat incision and drainage   Review of Systems  Constitutional: Negative for fever.  HENT: Negative.   Respiratory: Negative for shortness of breath.   Cardiovascular: Negative for chest pain.  Gastrointestinal: Negative.   Genitourinary: Negative.   Musculoskeletal: Positive for joint pain and myalgias.  Neurological: Negative.   Endo/Heme/Allergies: Negative.   Psychiatric/Behavioral: Negative.      Past Medical History:  Diagnosis Date  . CAD (coronary artery disease)    cath 5/23 100% dist RCA lesion treated with 2 overlapping Integrity Resolute DES ( 2.25x10mm, 2.25x21mm), 60% mid RCA treated medically, 99% OM2 not amenable to PCI, 70% D1 lesion, EF normal  . Diabetes mellitus without complication ([place])   . Hypercholesteremia   . Hypertension   . Thyroid disease     Past Surgical History:  Procedure Laterality Date  . ABDOMINAL HYSTERECTOMY    . CARDIAC CATHETERIZATION N/A 11/03/2015   Procedure: Left Heart Cath and Coronary Angiography;  Surgeon: Leonie Man, MD;  Location: [place] [facility];   Service: Cardiovascular;  Laterality: N/A;  . CARDIAC CATHETERIZATION N/A 11/03/2015   Procedure: Coronary Stent Intervention;  Surgeon: Leonie Man, MD;  Location: [place] [facility];  Service: Cardiovascular;  Laterality: N/A;  . CARPAL TUNNEL RELEASE      Family History  Problem Relation Age of Onset  . Heart disease Mother   . Stroke Sister   . Heart attack Brother    Social History   Tobacco Use  . Smoking status: Current Some Day Smoker    Packs/day: 0.25    Types: Cigarettes  . Smokeless tobacco: Never Used  Substance Use Topics  . Alcohol use: No    Alcohol/week: 0.0 standard drinks  . Drug use: No    No Known Allergies   Current Meds  Medication Sig  . amLODipine (NORVASC) 10 MG tablet Take 1 tablet (10 mg total) by mouth daily.  Marland Kitchen aspirin 81 MG EC tablet Take 2 tablets (162 mg total) by mouth daily.  Marland Kitchen atorvastatin (LIPITOR) 80 MG tablet Take 1 tablet (80 mg total) by mouth daily at 6 PM. (Patient taking differently: Take 80 mg by mouth every morning. )  . buPROPion (WELLBUTRIN SR) 150 MG 12 hr tablet Take 1 tablet (150 mg total) by mouth 2 (two) times daily.  . carvedilol (COREG) 12.5 MG tablet Take 1 tablet (12.5 mg total) by mouth 2 (two) times daily with a meal. (Patient taking differently: Take 12.5 mg by mouth every morning. )  . diclofenac (VOLTAREN) 75 MG EC tablet TAKE 1 TABLET BY MOUTH TWICE DAILY WITH A MEAL (Patient taking differently: Take 75 mg by mouth 2 (two) times  daily. )  . diclofenac sodium (VOLTAREN) 1 % GEL Apply 4 g topically 4 (four) times daily.  . DULoxetine (CYMBALTA) 60 MG capsule Take 1 capsule (60 mg total) by mouth daily. (Patient taking differently: Take 60 mg by mouth every morning. )  . furosemide (LASIX) 20 MG tablet Take 1 tablet (20 mg total) by mouth daily. (Patient taking differently: Take 20 mg by mouth every other day. )  . HYDROcodone-acetaminophen (NORCO/VICODIN) 5-325 MG tablet One tablet by mouth every six hours as  needed for pain. Must last 30 days  . insulin lispro (HUMALOG) 100 UNIT/ML injection Inject 40 Units into the skin daily. Via pump  . levothyroxine (SYNTHROID, LEVOTHROID) 112 MCG tablet Take 112 mcg by mouth daily before breakfast.   . lisinopril (PRINIVIL,ZESTRIL) 40 MG tablet Take 1 tablet (40 mg total) by mouth daily.  . sitaGLIPtin (JANUVIA) 100 MG tablet Take 100 mg by mouth daily.     BP (!) 195/105   Pulse 79   Ht 5\' 7"  (1.702 m)   Wt 235 lb (106.6 kg)   BMI 36.81 kg/m   Physical Exam Nursing note reviewed.  Constitutional:      Comments: She is alert awake and oriented x3 mood and affect are normal. BP (!) 195/105   Pulse 79   Ht 5\' 7"  (1.702 m)   Wt 235 lb (106.6 kg)   BMI 36.81 kg/m   Rooming hygiene normal  Musculoskeletal:     Right knee: She exhibits no effusion.     Left knee: She exhibits no effusion.     Right Knee Exam   Muscle Strength  The patient has normal right knee strength.  Tenderness  The patient is experiencing tenderness in the lateral joint line.  Range of Motion  Extension: normal  Flexion:  120 normal   Tests  McMurray:  Medial - negative Lateral - negative Varus: negative Valgus: negative Drawer:  Anterior - negative    Posterior - negative  Other  Erythema: absent Scars: absent Sensation: normal Pulse: present Swelling: none Effusion: no effusion present  Comments:  Moderate peripheral edema bilaterally with pitting chronic  Normal pulses dorsalis pedis posterior tib bilaterally.  Peroneal nerve function normal bilaterally as well.   Left Knee Exam   Muscle Strength  The patient has normal left knee strength.  Tenderness  The patient is experiencing no tenderness.   Range of Motion  Extension: normal  Flexion:  130 normal   Tests  McMurray:  Medial - negative Lateral - negative Varus: negative Valgus: negative Drawer:  Anterior - negative     Posterior - negative  Other  Erythema: absent Scars:  absent Sensation: normal Pulse: present Swelling: none Effusion: no effusion present      No assistive devices for gait but has an obvious limp.  Has a valgus aligned right knee mild to moderate   MEDICAL DECISION SECTION  xrays ordered? no  My independent reading of xrays: X-ray was done August 01, 2018  Alignment 15 to 16 degrees of tibiofemoral valgus Lateral compartment bone-on-bone Sclerosis subchondral bone.  No major osteophytes with posterior bone noted.   Encounter Diagnosis  Name Primary?  . Primary localized osteoarthritis of knee and the right knee Yes     PLAN:   Surgical procedure planned: Right total knee The procedure has been fully reviewed with the patient; The risks and benefits of surgery have been discussed and explained and understood. Alternative treatment has also been reviewed, questions were encouraged  and answered. The postoperative plan is also been reviewed.  Nonsurgical treatment as described in the history and physical section was attempted and unsuccessful and the patient has agreed to proceed with surgical intervention to improve their situation.  Patient's valgus deformity does put her at risk for peroneal nerve dysfunction after surgery patient made aware may need AFO if that occurs with excellent chance of recovery over 6 months.  She is also diabetic has heart disease but no chest pain in the last 4 years she has a stent and is only on aspirin.  She has been asked to quit smoking for the next month prior to surgery which she agreed to    No orders of the defined types were placed in this encounter.   Arther Abbott, MD 08/15/2018 9:00 AM

## 2018-08-20 DIAGNOSIS — E113512 Type 2 diabetes mellitus with proliferative diabetic retinopathy with macular edema, left eye: Secondary | ICD-10-CM | POA: Diagnosis not present

## 2018-08-20 DIAGNOSIS — E113511 Type 2 diabetes mellitus with proliferative diabetic retinopathy with macular edema, right eye: Secondary | ICD-10-CM | POA: Diagnosis not present

## 2018-08-27 ENCOUNTER — Telehealth: Payer: Self-pay | Admitting: Orthopedic Surgery

## 2018-08-27 ENCOUNTER — Encounter: Payer: Self-pay | Admitting: Orthopedic Surgery

## 2018-08-27 NOTE — Telephone Encounter (Signed)
ok 

## 2018-08-27 NOTE — Patient Instructions (Signed)
Jennifer [name]  08/27/2018     @PREFPERIOPPHARMACY @   Your procedure is scheduled on  09/04/2018 .  Report to [facility] at  615   A.M.  Call this number if you have problems the morning of surgery:  [phone]   Remember:  Do not eat or drink after midnight.                         Take these medicines the morning of surgery with A SIP OF WATER  Amlodipine, wellbutrin, coreg, voltaren(if needed), cymbalta, hydrocodone(if needed), levothyroxine. Take 1/2 of your usual night time insulin the night before your surgery. DO NOT take any medications for diabetes the morning of your surgery.    Do not wear jewelry, make-up or nail polish.  Do not wear lotions, powders, or perfumes, or deodorant.  Do not shave 48 hours prior to surgery.  Men may shave face and neck.  Do not bring valuables to the hospital.  [facility] is not responsible for any belongings or valuables.  Contacts, dentures or bridgework may not be worn into surgery.  Leave your suitcase in the car.  After surgery it may be brought to your room.  For patients admitted to the hospital, discharge time will be determined by your treatment team.  Patients discharged the day of surgery will not be allowed to drive home.   Name and phone number of your driver:   family Special instructions:  None  Please read over the following fact sheets that you were given. Anesthesia Post-op Instructions and Care and Recovery After Surgery       Total Knee Replacement, Care After Refer to this sheet in the next few weeks. These instructions provide you with information about caring for yourself after your procedure. Your health care provider may also give you more specific instructions. Your treatment has been planned according to current medical practices, but problems sometimes occur. Call your health care provider if you have any problems or questions after your procedure. What can I expect after the procedure? After  the procedure, it is common to have:  Pain and swelling.  A small amount of blood or clear fluid coming from your incision.  Limited range of motion. Follow these instructions at home: Medicines  Take over-the-counter and prescription medicines only as told by your health care provider.  If you were prescribed an antibiotic medicine, take it as told by your health care provider. Do not stop taking the antibiotic even if you start to feel better.  If you were prescribed a blood thinner (anticoagulant), take it as told by your health care provider. Bathing  Do not take baths, swim, or use a hot tub until your health care provider approves. Ask your health care provider if you can take showers. You may only be allowed to take sponge baths for bathing.  If you have an immobilizer that is not waterproof, cover it with a watertight covering when you take a bath or shower.  Keep your bandage (dressing) dry until your health care provider says it can be removed. Incision care and drain care   Check your incision area and drain site every day for signs of infection. Check for: ? More redness, swelling, or pain. ? More fluid or blood. ? Warmth. ? Pus or a bad smell.  Follow instructions from your health care provider about how to take care of your incision.  Make sure you: ? Wash your hands with soap and water before you change your dressing. If soap and water are not available, use alcohol-based hand sanitizer. ? Change your dressing as told by your health care provider. ? Leave stitches (sutures), skin glue, or adhesive strips in place. These skin closures may need to stay in place for 2 weeks or longer. If adhesive strip edges start to loosen and curl up, you may trim the loose edges. Do not remove adhesive strips completely unless your health care provider tells you to do that.  If you have a drain, follow instructions from your health care provider about caring for it. Do not remove the  drain tube or any dressings around the tube opening unless your health care provider approves. Managing pain, stiffness, and swelling      If directed, put ice on your knee. ? Put ice in a plastic bag or use the icing device (cold flow pad or cryocuff) that you were given. Follow instructions from your health care provider about how to use the icing device. ? Place a towel between your skin and the bag or between your skin and the icing device. ? Leave the ice on for 20 minutes, 2-3 times per day.  If directed, apply heat to the affected area as often as told by your health care provider. Use the heat source that your health care provider recommends, such as a moist heat pack or a heating pad. ? Place a towel between your skin and the heat source. ? Leave the heat on for 20-30 minutes. ? Remove the heat if your skin turns bright red. This is especially important if you are unable to feel pain, heat, or cold. You may have a greater risk of getting burned.  Move your toes often to avoid stiffness and to lessen swelling.  Raise (elevate) your knee above the level of your heart while you are sitting or lying down.  Wear elastic knee support for as long as told by your health care provider. Driving   Do not drive until your health care provider approves. Ask your health care provider when it is safe to drive if you have an immobilizer on your knee.  Do not drive or operate heavy machinery while taking prescription pain medicine.  Do not drive for 24 hours if you received a sedative. Activity  Do not play contact sports until your health care provider approves.  Avoid high-impact activities, including running, jumping rope, and jumping jacks.  Avoid sitting for a long time without moving. Get up and move around at least every few hours.  If physical therapy was prescribed, do exercises as told by your health care provider.  Return to your normal activities as told by your health care  provider. Ask your health care provider what activities are safe for you. Safety  Do not use your leg to support your body weight until your health care provider approves. Use crutches or a walker as told by your health care provider. General instructions  Do not have any dental work done for at least 3 months after your surgery. When you do have dental work done, tell your dentist about your joint replacement.  Do not use any tobacco products, such as cigarettes, chewing tobacco, or e-cigarettes. If you need help quitting, ask your health care provider.  Wear compression stockings as told by your health care provider. These stockings help to prevent blood clots and reduce swelling in your legs.  If  you have been sent home with a continuous passive motion machine, use it as told by your health care provider.  Drink enough fluid to keep your urine clear or pale yellow.  If you have been instructed to lose weight, follow instructions from your health care provider about how to do this safely.  Keep all follow-up visits as told by your health care provider. This is important. Contact a health care provider if:  You have more redness, swelling, or pain around your incision or drain.  You have more fluid or blood coming from your incision or drain.  Your incision or drain site feels warm to the touch.  You have pus or a bad smell coming from your incision or drain.  You have a fever.  Your incision breaks open after your health care provider removes your sutures, skin glue, or adhesive tape.  Your prosthesis feels loose.  You have knee pain that does not go away. Get help right away if:  You have a rash.  You have pain or swelling in your calf or thigh.  You have shortness of breath or difficulty breathing.  You have chest pain.  Your range of motion in your knee is getting worse. This information is not intended to replace advice given to you by your health care provider.  Make sure you discuss any questions you have with your health care provider. Document Released: 12/17/2004 Document Revised: 10/23/2016 Document Reviewed: 05/06/2015 Elsevier Interactive Patient Education  2019 [place].  Spinal Anesthesia and Epidural Anesthesia, Care After This sheet gives you information about how to care for yourself after your procedure. Your doctor may also give you more specific instructions. If you have problems or questions, call your doctor. Follow these instructions at home: For at least 24 hours after the procedure:   Have a responsible adult stay with you. It is important to have someone help care for you until you are awake and alert.  Rest as needed.  Do not do activities where you could fall or get hurt (injured).  Do not drive.  Do not use heavy machinery.  Do not drink alcohol.  Do not take sleeping pills or medicines that make you sleepy (drowsy).  Do not make important decisions.  Do not sign legal documents.  Do not take care of children on your own. Eating and drinking  If you throw up (vomit), drink water, juice, or soup when nausea and vomiting stop.  Drink enough fluid to keep your pee (urine) pale yellow.  Make sure you do not feel like throwing up (nauseous) before you eat solid foods.  Follow the diet that your doctor recommends. General instructions  Return to your normal activities as told by your doctor. Ask your doctor what activities are safe for you.  Take over-the-counter and prescription medicines only as told by your doctor.  If you have sleep apnea, surgery and certain medicines can raise your risk for breathing problems. Follow instructions from your doctor about when to wear your sleep device. Your doctor may tell you to wear your sleep device: ? Anytime you are sleeping, including during daytime naps. ? While taking prescription pain medicines, sleeping pills, or medicines that make you sleepy.  Do not use  any products that contain nicotine or tobacco. This includes cigarettes and e-cigarettes. ? If you need help quitting, ask your doctor. ? If you smoke, do not smoke by yourself. Make sure someone is nearby in case you need help.  Keep all  follow-up visits as told by your doctor. This is important. Contact a doctor if:  It has been more than one day since your procedure and you feel like throwing up.  It has been more than one day since your procedure and you throw up.  You have a rash. Get help right away if:  You have a fever.  You have a headache that lasts a long time.  You have a very bad headache.  Your vision is blurry.  You see two of a single object (double vision).  You are dizzy or light-headed.  You faint.  Your arms or legs tingle, feel weak, or get numb.  You have trouble breathing.  You cannot pee (urinate). Summary  After the procedure, have a responsible adult stay with you at home until you are fully awake and alert.  Do not do activities that might get you injured. Do not drive, use heavy machinery, drink alcohol, or make important decisions for 24 hours after the procedure.  Take medicines as told by your doctor. Do not use products that contain nicotine or tobacco.  Get help right away if you have a fever, blurry vision, difficulty breathing or passing urine, or weakness or numbness in arms or legs. This information is not intended to replace advice given to you by your health care provider. Make sure you discuss any questions you have with your health care provider. Document Released: 09/21/2015 Document Revised: 01/11/2017 Document Reviewed: 09/21/2015 Elsevier Interactive Patient Education  2019 [place] Anesthesia, Adult, Care After This sheet gives you information about how to care for yourself after your procedure. Your health care provider may also give you more specific instructions. If you have problems or questions, contact  your health care provider. What can I expect after the procedure? After the procedure, the following side effects are common:  Pain or discomfort at the IV site.  Nausea.  Vomiting.  Sore throat.  Trouble concentrating.  Feeling cold or chills.  Weak or tired.  Sleepiness and fatigue.  Soreness and body aches. These side effects can affect parts of the body that were not involved in surgery. Follow these instructions at home:  For at least 24 hours after the procedure:  Have a responsible adult stay with you. It is important to have someone help care for you until you are awake and alert.  Rest as needed.  Do not: ? Participate in activities in which you could fall or become injured. ? Drive. ? Use heavy machinery. ? Drink alcohol. ? Take sleeping pills or medicines that cause drowsiness. ? Make important decisions or sign legal documents. ? Take care of children on your own. Eating and drinking  Follow any instructions from your health care provider about eating or drinking restrictions.  When you feel hungry, start by eating small amounts of foods that are soft and easy to digest (bland), such as toast. Gradually return to your regular diet.  Drink enough fluid to keep your urine pale yellow.  If you vomit, rehydrate by drinking water, juice, or clear broth. General instructions  If you have sleep apnea, surgery and certain medicines can increase your risk for breathing problems. Follow instructions from your health care provider about wearing your sleep device: ? Anytime you are sleeping, including during daytime naps. ? While taking prescription pain medicines, sleeping medicines, or medicines that make you drowsy.  Return to your normal activities as told by your health care provider. Ask your health care provider  what activities are safe for you.  Take over-the-counter and prescription medicines only as told by your health care provider.  If you smoke, do  not smoke without supervision.  Keep all follow-up visits as told by your health care provider. This is important. Contact a health care provider if:  You have nausea or vomiting that does not get better with medicine.  You cannot eat or drink without vomiting.  You have pain that does not get better with medicine.  You are unable to pass urine.  You develop a skin rash.  You have a fever.  You have redness around your IV site that gets worse. Get help right away if:  You have difficulty breathing.  You have chest pain.  You have blood in your urine or stool, or you vomit blood. Summary  After the procedure, it is common to have a sore throat or nausea. It is also common to feel tired.  Have a responsible adult stay with you for the first 24 hours after general anesthesia. It is important to have someone help care for you until you are awake and alert.  When you feel hungry, start by eating small amounts of foods that are soft and easy to digest (bland), such as toast. Gradually return to your regular diet.  Drink enough fluid to keep your urine pale yellow.  Return to your normal activities as told by your health care provider. Ask your health care provider what activities are safe for you. This information is not intended to replace advice given to you by your health care provider. Make sure you discuss any questions you have with your health care provider. Document Released: 09/05/2000 Document Revised: 01/13/2017 Document Reviewed: 01/13/2017 Elsevier Interactive Patient Education  2019 [employer].

## 2018-08-29 DIAGNOSIS — E1165 Type 2 diabetes mellitus with hyperglycemia: Secondary | ICD-10-CM | POA: Diagnosis not present

## 2018-08-30 ENCOUNTER — Other Ambulatory Visit: Payer: Self-pay

## 2018-08-30 ENCOUNTER — Encounter (HOSPITAL_COMMUNITY)
Admission: RE | Admit: 2018-08-30 | Discharge: 2018-08-30 | Disposition: A | Discharge status: Home / Self Care | Payer: 59 | Source: Ambulatory Visit | Attending: Orthopedic Surgery | Admitting: Orthopedic Surgery

## 2018-08-30 ENCOUNTER — Telehealth: Payer: Self-pay | Admitting: Orthopedic Surgery

## 2018-08-30 NOTE — Telephone Encounter (Signed)
We discussed, RS case until May due to COVID 19

## 2018-08-30 NOTE — Telephone Encounter (Signed)
Left message for patient to call me back. 

## 2018-08-30 NOTE — Pre-Procedure Instructions (Signed)
Patient arrived for PAT. Spoke with Dr Aline Brochure and due to [facility] recommendations we will have to cancel case for now. I spoke with patient and husband who verbalize understanding of this. Dr Harrison's office will call to reschedule.

## 2018-08-30 NOTE — Telephone Encounter (Signed)
Patient called saying she just left pre-admission testing and was told about surgeries being put on hold. She has questions  Please call and advise her.

## 2018-08-30 NOTE — Telephone Encounter (Signed)
I will call her, she is on my list

## 2018-08-31 NOTE — Telephone Encounter (Addendum)
May 19th  is date we are going to try to [facility] patient no answer unable to leave message

## 2018-09-03 ENCOUNTER — Telehealth: Payer: Self-pay | Admitting: Orthopedic Surgery

## 2018-09-03 NOTE — Telephone Encounter (Signed)
Jennifer [name] spoke to her already, she has asked about note to [facility]

## 2018-09-03 NOTE — Telephone Encounter (Signed)
Patient wants note to return to work  Will RS surgery until May 19th

## 2018-09-03 NOTE — Telephone Encounter (Signed)
ok 

## 2018-09-03 NOTE — Telephone Encounter (Signed)
Please call Melady when you can please  Thanks

## 2018-09-04 ENCOUNTER — Encounter: Payer: Self-pay | Admitting: Orthopedic Surgery

## 2018-09-07 ENCOUNTER — Telehealth: Payer: Self-pay | Admitting: Orthopedic Surgery

## 2018-09-07 NOTE — Telephone Encounter (Signed)
Patient's return to work verification note faxed on 09/05/18 to employer [employer] as per request (due to surgery being postponed during Covid-19 restrictions); fax#[phone]. Patient aware.

## 2018-09-19 ENCOUNTER — Ambulatory Visit: Payer: 59 | Admitting: Orthopedic Surgery

## 2018-09-20 ENCOUNTER — Telehealth: Payer: Self-pay | Admitting: Radiology

## 2018-09-20 ENCOUNTER — Telehealth: Payer: Self-pay | Admitting: Orthopaedic Surgery

## 2018-09-20 MED ORDER — HYDROCODONE-ACETAMINOPHEN 5-325 MG PO TABS: ORAL_TABLET | ORAL | 0 refills | Status: DC

## 2018-09-20 NOTE — Telephone Encounter (Signed)
Hydrocodone-Acetaminophen 5/325 mg  Qty 60 Tablets  PATIENT USES [facility]

## 2018-09-20 NOTE — Telephone Encounter (Signed)
I called patient about surgery on June 16th due to Covid, she understands.

## 2018-09-24 ENCOUNTER — Telehealth: Payer: Self-pay | Admitting: Orthopaedic Surgery

## 2018-09-24 ENCOUNTER — Other Ambulatory Visit: Payer: Self-pay | Admitting: Family Medicine

## 2018-09-25 ENCOUNTER — Other Ambulatory Visit: Payer: Self-pay | Admitting: *Deleted

## 2018-09-25 MED ORDER — AMLODIPINE BESYLATE 10 MG PO TABS: 10.0000 mg | ORAL_TABLET | Freq: Every day | ORAL | 5 refills | Status: DC

## 2018-09-25 NOTE — Telephone Encounter (Signed)
Pt had both meds filled on 09/18/17 (3 month supply) with 3 refills so they are both due. Last OV was 06/15/18 but pt never f/u on elevated BP after OV as Dr. Glori Bickers stated in check out papers

## 2018-09-25 NOTE — Telephone Encounter (Signed)
Jennifer [name] noted on the wrong phone note, this isn't a refill request from Dr. Glori Bickers

## 2018-09-25 NOTE — Telephone Encounter (Signed)
It looks like her bp readings have been up lately   (I know she is planning an orthopedic surgery)   Is her bp still up at home and/or at work? Please ask her  I refilled meds for 3 mo

## 2018-09-25 NOTE — Telephone Encounter (Signed)
Refilled at ED on 06/16/18 #30 tabs with 0 refills. Dr. Glori Bickers hasn't prescribed Rx before so ? If pt should be on med since one month was given on 06/16/18, also see last refill note where pt said she will check BP at work tonight and update [place] with reading tomorrow

## 2018-09-25 NOTE — Telephone Encounter (Signed)
Pt hasn't checked BP at home but she said she will get the nurse at her job to check it tonight so it will be an accurate reading and update [place] tomorrow what it was

## 2018-09-25 NOTE — Telephone Encounter (Signed)
Pt returned your call Best number [phone]

## 2018-09-25 NOTE — Telephone Encounter (Signed)
Left VM requesting pt to call the office back 

## 2018-09-26 NOTE — Telephone Encounter (Signed)
Called pt to check on BP and she said she forgot to get the nurse to check BP at work last night so she will do it today and update [place] tomorrow on her BP

## 2018-09-27 NOTE — Telephone Encounter (Signed)
Pt called back the nurse at her job checked her BP last night and it was 142/68. Pt hasn't been checking at home so not sure on average how it's running on a regular basis

## 2018-10-05 DIAGNOSIS — E1165 Type 2 diabetes mellitus with hyperglycemia: Secondary | ICD-10-CM | POA: Diagnosis not present

## 2018-10-07 ENCOUNTER — Other Ambulatory Visit: Payer: Self-pay

## 2018-10-07 ENCOUNTER — Emergency Department (HOSPITAL_COMMUNITY): Payer: 59

## 2018-10-07 ENCOUNTER — Inpatient Hospital Stay (HOSPITAL_COMMUNITY)
Admission: EM | Admit: 2018-10-07 | Discharge: 2018-10-10 | DRG: 066 | Disposition: A | Discharge status: Home Health | Payer: 59 | Attending: Internal Medicine | Admitting: Internal Medicine

## 2018-10-07 ENCOUNTER — Encounter (HOSPITAL_COMMUNITY): Payer: Self-pay | Admitting: *Deleted

## 2018-10-07 DIAGNOSIS — E119 Type 2 diabetes mellitus without complications: Secondary | ICD-10-CM

## 2018-10-07 DIAGNOSIS — G459 Transient cerebral ischemic attack, unspecified: Secondary | ICD-10-CM

## 2018-10-07 DIAGNOSIS — I252 Old myocardial infarction: Secondary | ICD-10-CM

## 2018-10-07 DIAGNOSIS — Z87891 Personal history of nicotine dependence: Secondary | ICD-10-CM | POA: Diagnosis present

## 2018-10-07 DIAGNOSIS — Z955 Presence of coronary angioplasty implant and graft: Secondary | ICD-10-CM

## 2018-10-07 DIAGNOSIS — Z720 Tobacco use: Secondary | ICD-10-CM

## 2018-10-07 DIAGNOSIS — E1165 Type 2 diabetes mellitus with hyperglycemia: Secondary | ICD-10-CM

## 2018-10-07 DIAGNOSIS — F4323 Adjustment disorder with mixed anxiety and depressed mood: Secondary | ICD-10-CM | POA: Diagnosis present

## 2018-10-07 DIAGNOSIS — I639 Cerebral infarction, unspecified: Principal | ICD-10-CM | POA: Diagnosis present

## 2018-10-07 DIAGNOSIS — E1142 Type 2 diabetes mellitus with diabetic polyneuropathy: Secondary | ICD-10-CM | POA: Diagnosis present

## 2018-10-07 DIAGNOSIS — Z9049 Acquired absence of other specified parts of digestive tract: Secondary | ICD-10-CM

## 2018-10-07 DIAGNOSIS — R29701 NIHSS score 1: Secondary | ICD-10-CM | POA: Diagnosis present

## 2018-10-07 DIAGNOSIS — R112 Nausea with vomiting, unspecified: Secondary | ICD-10-CM

## 2018-10-07 DIAGNOSIS — E039 Hypothyroidism, unspecified: Secondary | ICD-10-CM | POA: Diagnosis present

## 2018-10-07 DIAGNOSIS — E669 Obesity, unspecified: Secondary | ICD-10-CM | POA: Diagnosis present

## 2018-10-07 DIAGNOSIS — R1084 Generalized abdominal pain: Secondary | ICD-10-CM | POA: Diagnosis not present

## 2018-10-07 DIAGNOSIS — Z79890 Hormone replacement therapy (postmenopausal): Secondary | ICD-10-CM

## 2018-10-07 DIAGNOSIS — R42 Dizziness and giddiness: Secondary | ICD-10-CM | POA: Diagnosis not present

## 2018-10-07 DIAGNOSIS — R1110 Vomiting, unspecified: Secondary | ICD-10-CM

## 2018-10-07 DIAGNOSIS — E7800 Pure hypercholesterolemia, unspecified: Secondary | ICD-10-CM | POA: Diagnosis present

## 2018-10-07 DIAGNOSIS — Z823 Family history of stroke: Secondary | ICD-10-CM

## 2018-10-07 DIAGNOSIS — F17210 Nicotine dependence, cigarettes, uncomplicated: Secondary | ICD-10-CM | POA: Diagnosis present

## 2018-10-07 DIAGNOSIS — R531 Weakness: Secondary | ICD-10-CM | POA: Diagnosis not present

## 2018-10-07 DIAGNOSIS — Z9861 Coronary angioplasty status: Secondary | ICD-10-CM

## 2018-10-07 DIAGNOSIS — E785 Hyperlipidemia, unspecified: Secondary | ICD-10-CM | POA: Diagnosis present

## 2018-10-07 DIAGNOSIS — R0689 Other abnormalities of breathing: Secondary | ICD-10-CM | POA: Diagnosis not present

## 2018-10-07 DIAGNOSIS — Z9641 Presence of insulin pump (external) (internal): Secondary | ICD-10-CM | POA: Diagnosis present

## 2018-10-07 DIAGNOSIS — Z794 Long term (current) use of insulin: Secondary | ICD-10-CM

## 2018-10-07 DIAGNOSIS — I2510 Atherosclerotic heart disease of native coronary artery without angina pectoris: Secondary | ICD-10-CM | POA: Diagnosis present

## 2018-10-07 DIAGNOSIS — Z6835 Body mass index (BMI) 35.0-35.9, adult: Secondary | ICD-10-CM

## 2018-10-07 DIAGNOSIS — E1151 Type 2 diabetes mellitus with diabetic peripheral angiopathy without gangrene: Secondary | ICD-10-CM | POA: Diagnosis present

## 2018-10-07 DIAGNOSIS — J449 Chronic obstructive pulmonary disease, unspecified: Secondary | ICD-10-CM | POA: Diagnosis present

## 2018-10-07 DIAGNOSIS — I10 Essential (primary) hypertension: Secondary | ICD-10-CM | POA: Diagnosis present

## 2018-10-07 DIAGNOSIS — Z79899 Other long term (current) drug therapy: Secondary | ICD-10-CM

## 2018-10-07 DIAGNOSIS — Z8249 Family history of ischemic heart disease and other diseases of the circulatory system: Secondary | ICD-10-CM

## 2018-10-07 DIAGNOSIS — Z7982 Long term (current) use of aspirin: Secondary | ICD-10-CM

## 2018-10-07 DIAGNOSIS — E1169 Type 2 diabetes mellitus with other specified complication: Secondary | ICD-10-CM | POA: Diagnosis present

## 2018-10-07 DIAGNOSIS — Z8673 Personal history of transient ischemic attack (TIA), and cerebral infarction without residual deficits: Secondary | ICD-10-CM

## 2018-10-07 HISTORY — DX: Type 2 diabetes mellitus without complications: E11.9

## 2018-10-07 LAB — CBG MONITORING, ED: Glucose-Capillary: 186 mg/dL — ABNORMAL HIGH (ref 70–99)

## 2018-10-07 MED ORDER — SODIUM CHLORIDE 0.9 % IV BOLUS
1000.0000 mL | Freq: Once | INTRAVENOUS | Status: AC
  Administered 2018-10-07: 1000 mL via INTRAVENOUS

## 2018-10-07 NOTE — ED Triage Notes (Signed)
Pt arrived to er by ems with c/o n/v with dizziness that is worse with movement and generalized weakness that started today, denies any diarrhea, denies any fever, denies any recent sick exposures, pt was given 4 mg zofran by ems enroute to er with little improvement of symptoms, pt dry heaving upon arrival to er,

## 2018-10-07 NOTE — ED Provider Notes (Signed)
[facility] EMERGENCY DEPARTMENT Provider Note   CSN: [id number] Arrival date & time: 10/07/18  2339    History   Chief Complaint Chief Complaint  Patient presents with   Emesis    HPI Jennifer [name] is a 63 y.o. female.     Patient brought in by EMS.  She presents with nausea and vomiting ongoing for the past 12 hours associated with dizziness and lightheadedness.  She reports not able to keep anything down throughout the day today and cannot count how many times she has vomited.  There is been no diarrhea or fever.  There has no been no sick contacts.  She denies any abdominal pain, pain with urination or blood in the urine.  No headache or chest pain.  She is never had anything like this before. History of CAD status post stents, diabetes, hypertension, previous stroke without deficits. She describes lightheadedness but denies vertigo.  Her family states that she was lying around all day today and not acting like herself and not able to keep anything down.  Previous cholecystectomy. Discussed with her daughter Barnett Applebaum [phone].  The history is provided by the patient, the EMS personnel and a relative.    Past Medical History:  Diagnosis Date   CAD (coronary artery disease)    cath 5/23 100% dist RCA lesion treated with 2 overlapping Integrity Resolute DES ( 2.25x35mm, 2.25x74mm), 60% mid RCA treated medically, 99% OM2 not amenable to PCI, 70% D1 lesion, EF normal   Diabetes mellitus without complication ([place])    Hypercholesteremia    Hypertension    Thyroid disease     Patient Active Problem List   Diagnosis Date Noted   Carotid artery disease ([place]) 03/15/2018   Acute CVA (cerebrovascular accident) ([place]) 11/16/2017   Rash and nonspecific skin eruption 11/09/2017   Low back pain 10/26/2017   Screening mammogram, encounter for 09/18/2017   CAD S/P percutaneous coronary angioplasty 11/04/2015   NSTEMI (non-ST elevated myocardial infarction) ([place]) 11/03/2015     Hypothyroidism 07/06/2010   Hyperlipidemia LDL goal <100 08/28/2008   Essential hypertension 11/13/2007   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, with long-term current use of insulin ([place]) 09/21/2006   DIABETIC PERIPHERAL NEUROPATHY 09/21/2006   Obesity (BMI 30-39.9) 09/21/2006   TOBACCO ABUSE 09/21/2006   Adjustment disorder with mixed anxiety and depressed mood 09/21/2006   CARPAL TUNNEL SYNDROME 09/21/2006   COPD (chronic obstructive pulmonary disease) ([place]) 09/21/2006   EDEMA 09/21/2006   MIGRAINES, HX OF 09/21/2006    Past Surgical History:  Procedure Laterality Date   ABDOMINAL HYSTERECTOMY     CARDIAC CATHETERIZATION N/A 11/03/2015   Procedure: Left Heart Cath and Coronary Angiography;  Surgeon: Leonie Man, MD;  Location: [facility];  Service: Cardiovascular;  Laterality: N/A;   CARDIAC CATHETERIZATION N/A 11/03/2015   Procedure: Coronary Stent Intervention;  Surgeon: Leonie Man, MD;  Location: [facility];  Service: Cardiovascular;  Laterality: N/A;   CARPAL TUNNEL RELEASE       OB History   No obstetric history on file.      Home Medications    Prior to Admission medications   Medication Sig Start Date End Date Taking? Authorizing Provider  amLODipine (NORVASC) 10 MG tablet Take 1 tablet (10 mg total) by mouth daily for 30 days. 09/25/18 10/25/18  [name], [name], MD  aspirin 81 MG EC tablet Take 2 tablets (162 mg total) by mouth daily. 11/16/17  Johnson, Clanford L, MD  atorvastatin (LIPITOR) 80 MG tablet Take 1 tablet (80 mg total) by mouth daily at 6 PM. Patient taking differently: Take 80 mg by mouth every morning.  03/15/18   Erlene Quan, PA-C  buPROPion (WELLBUTRIN SR) 150 MG 12 hr tablet Take 1 tablet by mouth twice daily 09/25/18   [name], [name], MD  carvedilol (COREG) 12.5 MG tablet Take 1 tablet (12.5 mg total) by mouth 2 (two) times daily with a meal. Patient taking  differently: Take 12.5 mg by mouth every morning.  03/15/18   Erlene Quan, PA-C  diclofenac (VOLTAREN) 75 MG EC tablet TAKE 1 TABLET BY MOUTH TWICE DAILY WITH MEALS 09/24/18   Sanjuana Kava, MD  diclofenac sodium (VOLTAREN) 1 % GEL Apply 4 g topically 4 (four) times daily. Patient taking differently: Apply 4 g topically 4 (four) times daily as needed (joint pain).  05/02/18   Sanjuana Kava, MD  DULoxetine (CYMBALTA) 60 MG capsule Take 1 capsule by mouth once daily 09/25/18   [name], [name], MD  furosemide (LASIX) 20 MG tablet Take 1 tablet (20 mg total) by mouth daily. Patient taking differently: Take 20 mg by mouth daily as needed for fluid.  03/15/18   Erlene Quan, PA-C  HYDROcodone-acetaminophen (NORCO/VICODIN) 5-325 MG tablet One tablet by mouth every six hours as needed for pain. Must last 30 days 09/20/18   Sanjuana Kava, MD  insulin lispro (HUMALOG) 100 UNIT/ML injection Inject 40 Units into the skin daily. Via pump    [provider]  levothyroxine (SYNTHROID, LEVOTHROID) 112 MCG tablet Take 112 mcg by mouth daily before breakfast.     [provider]  lisinopril (PRINIVIL,ZESTRIL) 40 MG tablet Take 1 tablet (40 mg total) by mouth daily. 06/15/18   [name], [name], MD  nitroGLYCERIN (NITROSTAT) 0.4 MG SL tablet Place 1 tablet (0.4 mg total) under the tongue every 5 (five) minutes x 3 doses as needed for chest pain. 03/15/18   Erlene Quan, PA-C  sitaGLIPtin (JANUVIA) 100 MG tablet Take 100 mg by mouth daily.     [provider]    Family History Family History  Problem Relation Age of Onset   Heart disease Mother    Stroke Sister    Heart attack Brother     Social History Social History   Tobacco Use   Smoking status: Current Some Day Smoker    Packs/day: 0.25    Types: Cigarettes   Smokeless tobacco: Never Used  Substance Use Topics   Alcohol use: No    Alcohol/week: 0.0 standard drinks   Drug use: No     Allergies   Patient has no  known allergies.   Review of Systems Review of Systems  Constitutional: Positive for activity change, appetite change and fatigue. Negative for fever.  HENT: Negative for congestion.   Respiratory: Negative for cough, chest tightness and shortness of breath.   Gastrointestinal: Positive for nausea and vomiting. Negative for abdominal pain.  Genitourinary: Negative for dysuria and hematuria.  Musculoskeletal: Negative for arthralgias and myalgias.  Skin: Negative for rash.  Neurological: Positive for dizziness, weakness and light-headedness. Negative for syncope, facial asymmetry, speech difficulty and headaches.   all other systems are negative except as noted in the HPI and PMH.     Physical Exam Updated Vital Signs BP (!) 222/84    Pulse 85    Temp 97.7 F (36.5 C) (Oral)    Resp 20    Ht  5\' 7"  (1.702 m)    Wt 104.3 kg    SpO2 94%    BMI 36.02 kg/m   Physical Exam Vitals signs and nursing note reviewed.  Constitutional:      General: She is not in acute distress.    Appearance: She is well-developed. She is obese. She is ill-appearing.     Comments: Appears ill but nontoxic. Will not make eye contact.  HENT:     Head: Normocephalic and atraumatic.     Mouth/Throat:     Mouth: Mucous membranes are dry.     Pharynx: No oropharyngeal exudate.  Eyes:     Conjunctiva/sclera: Conjunctivae normal.     Pupils: Pupils are equal, round, and reactive to light.  Neck:     Musculoskeletal: Normal range of motion and neck supple.     Comments: No meningismus. Cardiovascular:     Rate and Rhythm: Normal rate and regular rhythm.     Heart sounds: Normal heart sounds. No murmur.  Pulmonary:     Effort: Pulmonary effort is normal. No respiratory distress.     Breath sounds: Normal breath sounds.  Abdominal:     Palpations: Abdomen is soft.     Tenderness: There is no abdominal tenderness. There is no guarding or rebound.     Comments: Soft abdomen, no guarding or rebound.    Musculoskeletal: Normal range of motion.        General: No tenderness.     Comments: No CVA tenderness  Skin:    General: Skin is warm.     Capillary Refill: Capillary refill takes less than 2 seconds.  Neurological:     General: No focal deficit present.     Mental Status: She is alert and oriented to person, place, and time.     Cranial Nerves: No cranial nerve deficit.     Motor: No abnormal muscle tone.     Coordination: Coordination normal.     Comments: Diminished effort on neuro exam but no gross deficits.  Cranial nerves II to XII intact, tongue midline, no pronator drift, equal upper extremity grip strengths.  5/5 strength of upper and lower extremities bilaterally.  Psychiatric:        Behavior: Behavior normal.      ED Treatments / Results  Labs (all labs ordered are listed, but only abnormal results are displayed) Labs Reviewed  COMPREHENSIVE METABOLIC PANEL - Abnormal; Notable for the following components:      Result Value   Glucose, Bld 214 (*)    BUN 25 (*)    Calcium 8.4 (*)    Total Protein 5.9 (*)    Albumin 3.1 (*)    All other components within normal limits  CBG MONITORING, ED - Abnormal; Notable for the following components:   Glucose-Capillary 186 (*)    All other components within normal limits  URINE CULTURE  CBC WITH DIFFERENTIAL/PLATELET  LIPASE, BLOOD  TROPONIN I  LACTIC ACID, PLASMA  URINALYSIS, ROUTINE W REFLEX MICROSCOPIC  COMPREHENSIVE METABOLIC PANEL  TROPONIN I  TROPONIN I    EKG EKG Interpretation  Date/Time:  Sunday October 07 2018 23:48:38 EDT Ventricular Rate:  81 PR Interval:    QRS Duration: 97 QT Interval:  407 QTC Calculation: 473 R Axis:   63 Text Interpretation:  Sinus rhythm Ventricular premature complex Nonspecific T abnormalities, lateral leads No significant change was found Confirmed by Ezequiel Essex [phone]) on 10/07/2018 11:52:58 PM   Radiology Ct Angio Head W Or Wo Contrast  Result Date:  10/08/2018 CLINICAL DATA:  Initial evaluation for persistent vertigo. EXAM: CT ANGIOGRAPHY HEAD AND NECK TECHNIQUE: Multidetector CT imaging of the head and neck was performed using the standard protocol during bolus administration of intravenous contrast. Multiplanar CT image reconstructions and MIPs were obtained to evaluate the vascular anatomy. Carotid stenosis measurements (when applicable) are obtained utilizing NASCET criteria, using the distal internal carotid diameter as the denominator. CONTRAST:  Seventy-five cc of Omnipaque 350. COMPARISON:  Comparison made with prior head CT from earlier the same day. FINDINGS: CTA NECK FINDINGS Aortic arch: Visualized aortic arch of normal caliber with normal 3 vessel morphology. Mild scattered atheromatous plaque about the aortic arch and origin of the great vessels without hemodynamically significant stenosis. Visualized subclavian arteries widely patent. Right carotid system: Right common carotid artery patent from its origin to the bifurcation without stenosis. Mild atheromatous plaque about the right bifurcation without hemodynamically significant stenosis. Right ICA mildly tortuous but widely patent to the skull base without stenosis, dissection or occlusion. Left carotid system: Left common carotid artery patent from its origin to the bifurcation without stenosis. Minimal atheromatous plaque about the left bifurcation without hemodynamically significant stenosis. Left ICA mildly tortuous but widely patent to the skull base without stenosis, dissection or occlusion. Vertebral arteries: Both of the vertebral arteries arise from the subclavian arteries. Vertebral arteries widely patent within the neck without stenosis, dissection, or occlusion. Skeleton: No acute osseous abnormality. No discrete lytic or blastic osseous lesions. Degenerative geode formation noted at the dens. Gas lucency within the C4 vertebral body likely degenerative in nature. Patient largely  edentulous. Other neck: No other acute soft tissue abnormality within the neck. No adenopathy. Salivary glands normal. Subcentimeter hypodense right thyroid nodule noted, of doubtful significance. Upper chest: Visualized upper esophagus mildly patulous with layering fluid within the esophageal lumen. Visualized upper chest demonstrates no other acute finding. Visualized lungs are largely clear. Review of the MIP images confirms the above findings CTA HEAD FINDINGS Anterior circulation: Petrous segments widely patent bilaterally. Scattered calcified atheromatous plaque within the cavernous/supraclinoid ICAs with resultant mild to moderate narrowing (up to approximately 50%). ICA termini well perfused. A1 segments patent bilaterally. Normal anterior communicating artery. Anterior cerebral arteries patent to their distal aspects without stenosis. M1 segments patent bilaterally. Normal MCA bifurcations. Distal MCA branches well perfused and symmetric. Posterior circulation: Vertebral arteries widely patent to the vertebrobasilar junction without stenosis. Posterior inferior cerebral arteries patent bilaterally. Basilar widely patent to its distal aspect. Superior cerebral arteries patent proximally. Both of the posterior cerebral arteries primarily supplied via the basilar. Both of the PCAs well perfused to their distal aspects without stenosis. Venous sinuses: Patent. Anatomic variants: None significant. Delayed phase: Not performed. Review of the MIP images confirms the above findings IMPRESSION: 1. Negative CTA for large vessel occlusion. No dissection or other acute vascular abnormality identified. 2. Atherosclerotic change within the carotid siphons with associated moderate multifocal narrowing. No other hemodynamically significant or correctable stenosis about the major arterial vasculature of the head and neck. Electronically Signed   By: Jeannine Boga M.D.   On: 10/08/2018 03:49   Ct Head Wo  Contrast  Result Date: 10/08/2018 CLINICAL DATA:  Initial evaluation for acute altered mental status. EXAM: CT HEAD WITHOUT CONTRAST TECHNIQUE: Contiguous axial images were obtained from the base of the skull through the vertex without intravenous contrast. COMPARISON:  Prior CT from 06/16/2018. FINDINGS: Brain: Generalized age-related cerebral atrophy with moderate chronic small vessel ischemic disease. Remote lacunar infarct present at  the right basal ganglia. Small remote left cerebellar infarct. No acute intracranial hemorrhage. No acute large vessel territory infarct. No mass lesion, midline shift or mass effect. No hydrocephalus. No extra-axial fluid collection. Vascular: No hyperdense vessel. Scattered vascular calcifications noted within the carotid siphons. Skull: Scalp soft tissues within normal limits.  Calvarium intact. Sinuses/Orbits: Globes and orbital soft tissues demonstrate no acute finding. Tiny osteoma noted at the right ethmoidal air cells. Paranasal sinuses are clear. No mastoid effusion. Other: None. IMPRESSION: 1. No acute intracranial abnormality. 2. Remote right basal ganglia lacunar infarct, with additional small remote left cerebellar infarct. 3. Underlying age-related cerebral atrophy with moderate chronic small vessel ischemic disease. Electronically Signed   By: Jeannine Boga M.D.   On: 10/08/2018 00:38   Ct Angio Neck W And/or Wo Contrast  Result Date: 10/08/2018 CLINICAL DATA:  Initial evaluation for persistent vertigo. EXAM: CT ANGIOGRAPHY HEAD AND NECK TECHNIQUE: Multidetector CT imaging of the head and neck was performed using the standard protocol during bolus administration of intravenous contrast. Multiplanar CT image reconstructions and MIPs were obtained to evaluate the vascular anatomy. Carotid stenosis measurements (when applicable) are obtained utilizing NASCET criteria, using the distal internal carotid diameter as the denominator. CONTRAST:  Seventy-five cc  of Omnipaque 350. COMPARISON:  Comparison made with prior head CT from earlier the same day. FINDINGS: CTA NECK FINDINGS Aortic arch: Visualized aortic arch of normal caliber with normal 3 vessel morphology. Mild scattered atheromatous plaque about the aortic arch and origin of the great vessels without hemodynamically significant stenosis. Visualized subclavian arteries widely patent. Right carotid system: Right common carotid artery patent from its origin to the bifurcation without stenosis. Mild atheromatous plaque about the right bifurcation without hemodynamically significant stenosis. Right ICA mildly tortuous but widely patent to the skull base without stenosis, dissection or occlusion. Left carotid system: Left common carotid artery patent from its origin to the bifurcation without stenosis. Minimal atheromatous plaque about the left bifurcation without hemodynamically significant stenosis. Left ICA mildly tortuous but widely patent to the skull base without stenosis, dissection or occlusion. Vertebral arteries: Both of the vertebral arteries arise from the subclavian arteries. Vertebral arteries widely patent within the neck without stenosis, dissection, or occlusion. Skeleton: No acute osseous abnormality. No discrete lytic or blastic osseous lesions. Degenerative geode formation noted at the dens. Gas lucency within the C4 vertebral body likely degenerative in nature. Patient largely edentulous. Other neck: No other acute soft tissue abnormality within the neck. No adenopathy. Salivary glands normal. Subcentimeter hypodense right thyroid nodule noted, of doubtful significance. Upper chest: Visualized upper esophagus mildly patulous with layering fluid within the esophageal lumen. Visualized upper chest demonstrates no other acute finding. Visualized lungs are largely clear. Review of the MIP images confirms the above findings CTA HEAD FINDINGS Anterior circulation: Petrous segments widely patent  bilaterally. Scattered calcified atheromatous plaque within the cavernous/supraclinoid ICAs with resultant mild to moderate narrowing (up to approximately 50%). ICA termini well perfused. A1 segments patent bilaterally. Normal anterior communicating artery. Anterior cerebral arteries patent to their distal aspects without stenosis. M1 segments patent bilaterally. Normal MCA bifurcations. Distal MCA branches well perfused and symmetric. Posterior circulation: Vertebral arteries widely patent to the vertebrobasilar junction without stenosis. Posterior inferior cerebral arteries patent bilaterally. Basilar widely patent to its distal aspect. Superior cerebral arteries patent proximally. Both of the posterior cerebral arteries primarily supplied via the basilar. Both of the PCAs well perfused to their distal aspects without stenosis. Venous sinuses: Patent. Anatomic variants: None significant. Delayed  phase: Not performed. Review of the MIP images confirms the above findings IMPRESSION: 1. Negative CTA for large vessel occlusion. No dissection or other acute vascular abnormality identified. 2. Atherosclerotic change within the carotid siphons with associated moderate multifocal narrowing. No other hemodynamically significant or correctable stenosis about the major arterial vasculature of the head and neck. Electronically Signed   By: Jeannine Boga M.D.   On: 10/08/2018 03:49   Ct Abdomen Pelvis W Contrast  Result Date: 10/08/2018 CLINICAL DATA:  Initial evaluation for acute nausea, vomiting. EXAM: CT ABDOMEN AND PELVIS WITH CONTRAST TECHNIQUE: Multidetector CT imaging of the abdomen and pelvis was performed using the standard protocol following bolus administration of intravenous contrast. CONTRAST:  80mL OMNIPAQUE IOHEXOL 300 MG/ML  SOLN COMPARISON:  Prior radiograph from earlier the same day. FINDINGS: Lower chest: Mild scattered subsegmental atelectatic changes seen dependently within the lung bases  bilaterally. Visualized lungs are otherwise clear. Hepatobiliary: Liver demonstrates a normal contrast enhanced appearance. Gallbladder surgically absent. No biliary dilatation. Pancreas: Pancreas within normal limits. Spleen: Ill-defined 12 mm hypodensity noted within the spleen, indeterminate, but of doubtful clinical significance. Spleen otherwise unremarkable. Adrenals/Urinary Tract: Adrenal glands within normal limits. Kidneys equal in size with symmetric enhancement. Evaluation for nephrolithiasis limited due to timing of the contrast bolus, with the majority of the contrast in the renal collecting systems. No hydronephrosis or hydroureter. Prominent perinephric fat stranding seen bilaterally, indeterminate. Bladder moderately distended without acute finding. Stomach/Bowel: Small hiatal hernia noted. Stomach decompressed without acute finding. No evidence for bowel obstruction. Normal appendix. Colonic diverticulosis without evidence for acute diverticulitis. No acute inflammatory changes seen about the bowels. Vascular/Lymphatic: Moderate aortic atherosclerosis. No aneurysm. Mesenteric vessels patent proximally. No pathologically enlarged intra-abdominopelvic lymph nodes. Reproductive: Uterus is absent.  Ovaries not confidently identified. Other: No free air or fluid. Musculoskeletal: No acute osseous finding. No discrete lytic or blastic osseous lesions. IMPRESSION: 1. Prominent perinephric fat stranding about the kidneys bilaterally. Findings are nonspecific, and could reflect sequelae of acute infection/pyelonephritis. Sequelae of acute or prior intrinsic renal injury could also be considered. Correlation with urinalysis recommended. No hydronephrosis or obstructive uropathy. 2. Colonic diverticulosis without evidence for acute diverticulitis. 3. Moderate aortic atherosclerosis. Electronically Signed   By: Jeannine Boga M.D.   On: 10/08/2018 03:48   Dg Abdomen Acute W/chest  Result Date:  10/08/2018 CLINICAL DATA:  63 year old female with dizziness, generalized weakness. EXAM: DG ABDOMEN ACUTE W/ 1V CHEST COMPARISON:  Portable chest 11/03/2015. Lumbar radiographs 01/19/2017. FINDINGS: Upright AP view of the chest. Lung volumes and mediastinal contours are within normal limits. Visualized tracheal air column is within normal limits. No pneumothorax or pneumoperitoneum. No pulmonary edema, pleural effusion or acute pulmonary opacity identified. Upright and supine views of the abdomen and pelvis. Non obstructed bowel gas pattern. Stable cholecystectomy clips. Abdominal and pelvic visceral contours are within normal limits. No acute osseous abnormality identified. IMPRESSION: 1. Normal bowel gas pattern, no free air. 2. No acute cardiopulmonary abnormality. Electronically Signed   By: Genevie Ann M.D.   On: 10/08/2018 00:55    Procedures Procedures (including critical care time)  Medications Ordered in ED Medications  sodium chloride 0.9 % bolus 1,000 mL (has no administration in time range)     Initial Impression / Assessment and Plan / ED Course  I have reviewed the triage vital signs and the nursing notes.  Pertinent labs & imaging results that were available during my care of the patient were reviewed by me and considered in my medical  decision making (see chart for details).       12 hours of nausea and vomiting associated with lightheadedness.  No focal neuro deficits.  Abdomen is soft.  EKG is unchanged.  Patient will be hydrated, labs will be obtained.  Also obtaining CT head and acute abdominal series.  CT head shows no acute hemorrhage.  Does show several chronic infarcts. Acute abdominal series is negative  Labs are reassuring.  Hyperglycemia with normal anion gap, no evidence of DKA. EKG unchanged. Troponin negative. Lactate normal.  Patient continues with dry heaving and dizziness.  CTA will be obtained to evaluate for posterior circulation occlusion.  This is  negative for large vessel occlusion.  Vertebral arteries and basilar arteries are patent. CT abdomen does show some perinephric stranding but patient unable to give urine sample.  We will continue to hydrate and start empiric Rocephin for suspected pyelonephritis.  With ongoing symptoms patient will need admission. D/w Dr. Olevia Bowens  Final Clinical Impressions(s) / ED Diagnoses   Final diagnoses:  Intractable vomiting with nausea, unspecified vomiting type    ED Discharge Orders    None       Haiden Clucas, Annie Main, MD 10/08/18 [phone]

## 2018-10-08 ENCOUNTER — Emergency Department (HOSPITAL_COMMUNITY): Payer: 59

## 2018-10-08 ENCOUNTER — Other Ambulatory Visit: Payer: Self-pay

## 2018-10-08 ENCOUNTER — Encounter (HOSPITAL_COMMUNITY): Payer: Self-pay | Admitting: Internal Medicine

## 2018-10-08 ENCOUNTER — Observation Stay (HOSPITAL_COMMUNITY): Payer: 59

## 2018-10-08 DIAGNOSIS — E11618 Type 2 diabetes mellitus with other diabetic arthropathy: Secondary | ICD-10-CM

## 2018-10-08 DIAGNOSIS — Z9861 Coronary angioplasty status: Secondary | ICD-10-CM

## 2018-10-08 DIAGNOSIS — E1142 Type 2 diabetes mellitus with diabetic polyneuropathy: Secondary | ICD-10-CM

## 2018-10-08 DIAGNOSIS — J449 Chronic obstructive pulmonary disease, unspecified: Secondary | ICD-10-CM

## 2018-10-08 DIAGNOSIS — I10 Essential (primary) hypertension: Secondary | ICD-10-CM

## 2018-10-08 DIAGNOSIS — E119 Type 2 diabetes mellitus without complications: Secondary | ICD-10-CM

## 2018-10-08 DIAGNOSIS — Z720 Tobacco use: Secondary | ICD-10-CM

## 2018-10-08 DIAGNOSIS — E785 Hyperlipidemia, unspecified: Secondary | ICD-10-CM

## 2018-10-08 DIAGNOSIS — R1110 Vomiting, unspecified: Secondary | ICD-10-CM

## 2018-10-08 DIAGNOSIS — R42 Dizziness and giddiness: Secondary | ICD-10-CM

## 2018-10-08 DIAGNOSIS — E039 Hypothyroidism, unspecified: Secondary | ICD-10-CM

## 2018-10-08 DIAGNOSIS — Z87891 Personal history of nicotine dependence: Secondary | ICD-10-CM | POA: Diagnosis present

## 2018-10-08 DIAGNOSIS — E1165 Type 2 diabetes mellitus with hyperglycemia: Secondary | ICD-10-CM

## 2018-10-08 DIAGNOSIS — I2510 Atherosclerotic heart disease of native coronary artery without angina pectoris: Secondary | ICD-10-CM | POA: Diagnosis not present

## 2018-10-08 DIAGNOSIS — R531 Weakness: Secondary | ICD-10-CM | POA: Diagnosis not present

## 2018-10-08 HISTORY — DX: Type 2 diabetes mellitus without complications: E11.9

## 2018-10-08 LAB — COMPREHENSIVE METABOLIC PANEL
ALT: 18 U/L (ref 0–44)
ALT: 17 U/L (ref 0–44)
AST: 17 U/L (ref 15–41)
AST: 16 U/L (ref 15–41)
Albumin: 3.1 g/dL — ABNORMAL LOW (ref 3.5–5.0)
Albumin: 2.9 g/dL — ABNORMAL LOW (ref 3.5–5.0)
Alkaline Phosphatase: 74 U/L (ref 38–126)
Alkaline Phosphatase: 69 U/L (ref 38–126)
Anion gap: 7 (ref 5–15)
Anion gap: 8 (ref 5–15)
BUN: 25 mg/dL — ABNORMAL HIGH (ref 8–23)
BUN: 20 mg/dL (ref 8–23)
CO2: 26 mmol/L (ref 22–32)
CO2: 26 mmol/L (ref 22–32)
Calcium: 8.4 mg/dL — ABNORMAL LOW (ref 8.9–10.3)
Calcium: 8.4 mg/dL — ABNORMAL LOW (ref 8.9–10.3)
Chloride: 108 mmol/L (ref 98–111)
Chloride: 106 mmol/L (ref 98–111)
Creatinine, Ser: 0.84 mg/dL (ref 0.44–1.00)
Creatinine, Ser: 0.78 mg/dL (ref 0.44–1.00)
GFR calc Af Amer: >60 mL/min (ref >60)
GFR calc Af Amer: >60 mL/min (ref >60)
GFR calc non Af Amer: >60 mL/min (ref >60)
GFR calc non Af Amer: >60 mL/min (ref >60)
Glucose, Bld: 214 mg/dL — ABNORMAL HIGH (ref 70–99)
Glucose, Bld: 187 mg/dL — ABNORMAL HIGH (ref 70–99)
Potassium: 4.4 mmol/L (ref 3.5–5.1)
Potassium: 3.7 mmol/L (ref 3.5–5.1)
Sodium: 141 mmol/L (ref 135–145)
Sodium: 140 mmol/L (ref 135–145)
Total Bilirubin: 0.5 mg/dL (ref 0.3–1.2)
Total Bilirubin: 0.4 mg/dL (ref 0.3–1.2)
Total Protein: 5.9 g/dL — ABNORMAL LOW (ref 6.5–8.1)
Total Protein: 5.6 g/dL — ABNORMAL LOW (ref 6.5–8.1)

## 2018-10-08 LAB — LIPASE, BLOOD: Lipase: 21 U/L (ref 11–51)

## 2018-10-08 LAB — CBC WITH DIFFERENTIAL/PLATELET
Abs Immature Granulocytes: 0.03 10*3/uL (ref 0.00–0.07)
Basophils Absolute: 0 10*3/uL (ref 0.0–0.1)
Basophils Relative: 1 %
Eosinophils Absolute: 0 10*3/uL (ref 0.0–0.5)
Eosinophils Relative: 0 %
HCT: 40.6 % (ref 36.0–46.0)
Hemoglobin: 12.8 g/dL (ref 12.0–15.0)
Immature Granulocytes: 0 %
Lymphocytes Relative: 9 %
Lymphs Abs: 0.7 10*3/uL (ref 0.7–4.0)
MCH: 27.4 pg (ref 26.0–34.0)
MCHC: 31.5 g/dL (ref 30.0–36.0)
MCV: 86.9 fL (ref 80.0–100.0)
Monocytes Absolute: 0.3 10*3/uL (ref 0.1–1.0)
Monocytes Relative: 4 %
Neutro Abs: 6.8 10*3/uL (ref 1.7–7.7)
Neutrophils Relative %: 86 %
Platelets: 211 10*3/uL (ref 150–400)
RBC: 4.67 MIL/uL (ref 3.87–5.11)
RDW: 14.1 % (ref 11.5–15.5)
WBC: 7.9 10*3/uL (ref 4.0–10.5)
nRBC: 0 % (ref 0.0–0.2)

## 2018-10-08 LAB — TROPONIN I
Troponin I: <0.03 ng/mL (ref <0.03)
Troponin I: <0.03 ng/mL (ref <0.03)
Troponin I: <0.03 ng/mL (ref <0.03)

## 2018-10-08 LAB — GLUCOSE, CAPILLARY
Glucose-Capillary: 157 mg/dL — ABNORMAL HIGH (ref 70–99)
Glucose-Capillary: 161 mg/dL — ABNORMAL HIGH (ref 70–99)
Glucose-Capillary: 123 mg/dL — ABNORMAL HIGH (ref 70–99)

## 2018-10-08 LAB — LACTIC ACID, PLASMA: Lactic Acid, Venous: 0.7 mmol/L (ref 0.5–1.9)

## 2018-10-08 MED ORDER — LEVOTHYROXINE SODIUM 137 MCG PO TABS
137.0000 ug | ORAL_TABLET | Freq: Every day | ORAL | Status: DC
  Administered 2018-10-08 – 2018-10-10 (×3): 137 ug via ORAL
  Filled 2018-10-08 (×3): qty 1

## 2018-10-08 MED ORDER — ONDANSETRON HCL 4 MG/2ML IJ SOLN
4.0000 mg | Freq: Four times a day (QID) | INTRAMUSCULAR | Status: DC | PRN
  Administered 2018-10-08 – 2018-10-10 (×3): 4 mg via INTRAVENOUS
  Filled 2018-10-08 (×5): qty 2

## 2018-10-08 MED ORDER — ONDANSETRON HCL 4 MG PO TABS: 4.0000 mg | ORAL_TABLET | Freq: Four times a day (QID) | ORAL | Status: DC | PRN

## 2018-10-08 MED ORDER — AMLODIPINE BESYLATE 5 MG PO TABS
10.0000 mg | ORAL_TABLET | Freq: Every day | ORAL | Status: DC
  Administered 2018-10-08 – 2018-10-10 (×3): 10 mg via ORAL
  Filled 2018-10-08 (×3): qty 2

## 2018-10-08 MED ORDER — LORAZEPAM 2 MG/ML IJ SOLN
0.7500 mg | Freq: Once | INTRAMUSCULAR | Status: AC
  Administered 2018-10-08: 06:00:00 0.75 mg via INTRAVENOUS
  Filled 2018-10-08: qty 1

## 2018-10-08 MED ORDER — DICLOFENAC SODIUM 75 MG PO TBEC
75.0000 mg | DELAYED_RELEASE_TABLET | Freq: Two times a day (BID) | ORAL | Status: DC
  Administered 2018-10-08 – 2018-10-10 (×6): 75 mg via ORAL
  Filled 2018-10-08 (×6): qty 1

## 2018-10-08 MED ORDER — SODIUM CHLORIDE 0.9 % IV SOLN
1.0000 g | Freq: Once | INTRAVENOUS | Status: AC
  Administered 2018-10-08: 1 g via INTRAVENOUS
  Filled 2018-10-08: qty 10

## 2018-10-08 MED ORDER — ENOXAPARIN SODIUM 40 MG/0.4ML ~~LOC~~ SOLN
40.0000 mg | SUBCUTANEOUS | Status: DC
  Administered 2018-10-08 – 2018-10-10 (×3): 40 mg via SUBCUTANEOUS
  Filled 2018-10-08 (×3): qty 0.4

## 2018-10-08 MED ORDER — SODIUM CHLORIDE 0.9 % IV BOLUS
1000.0000 mL | Freq: Once | INTRAVENOUS | Status: AC
  Administered 2018-10-08: 1000 mL via INTRAVENOUS

## 2018-10-08 MED ORDER — BUPROPION HCL ER (SR) 150 MG PO TB12
150.0000 mg | ORAL_TABLET | Freq: Two times a day (BID) | ORAL | Status: DC
  Administered 2018-10-08 – 2018-10-10 (×5): 150 mg via ORAL
  Filled 2018-10-08 (×5): qty 1

## 2018-10-08 MED ORDER — ATORVASTATIN CALCIUM 40 MG PO TABS
80.0000 mg | ORAL_TABLET | Freq: Every morning | ORAL | Status: DC
  Administered 2018-10-08 – 2018-10-10 (×3): 80 mg via ORAL
  Filled 2018-10-08 (×3): qty 2

## 2018-10-08 MED ORDER — FUROSEMIDE 20 MG PO TABS: 20.0000 mg | ORAL_TABLET | Freq: Every day | ORAL | Status: DC | PRN

## 2018-10-08 MED ORDER — ASPIRIN EC 81 MG PO TBEC
162.0000 mg | DELAYED_RELEASE_TABLET | Freq: Every day | ORAL | Status: DC
  Administered 2018-10-08 – 2018-10-10 (×3): 162 mg via ORAL
  Filled 2018-10-08 (×4): qty 2

## 2018-10-08 MED ORDER — LISINOPRIL 10 MG PO TABS
40.0000 mg | ORAL_TABLET | Freq: Every day | ORAL | Status: DC
  Administered 2018-10-08 – 2018-10-10 (×3): 40 mg via ORAL
  Filled 2018-10-08 (×3): qty 4

## 2018-10-08 MED ORDER — ACETAMINOPHEN 650 MG RE SUPP: 650.0000 mg | Freq: Four times a day (QID) | RECTAL | Status: DC | PRN

## 2018-10-08 MED ORDER — LINAGLIPTIN 5 MG PO TABS
5.0000 mg | ORAL_TABLET | Freq: Every day | ORAL | Status: DC
  Administered 2018-10-08: 5 mg via ORAL
  Filled 2018-10-08: qty 1

## 2018-10-08 MED ORDER — SODIUM CHLORIDE 0.45 % IV SOLN: INTRAVENOUS | Status: DC

## 2018-10-08 MED ORDER — NITROGLYCERIN 0.4 MG SL SUBL: 0.4000 mg | SUBLINGUAL_TABLET | SUBLINGUAL | Status: DC | PRN

## 2018-10-08 MED ORDER — MECLIZINE HCL 12.5 MG PO TABS: 25.0000 mg | ORAL_TABLET | Freq: Three times a day (TID) | ORAL | Status: DC | PRN

## 2018-10-08 MED ORDER — IOHEXOL 300 MG/ML  SOLN
75.0000 mL | Freq: Once | INTRAMUSCULAR | Status: AC | PRN
  Administered 2018-10-08: 02:00:00 75 mL via INTRAVENOUS

## 2018-10-08 MED ORDER — CARVEDILOL 12.5 MG PO TABS
12.5000 mg | ORAL_TABLET | Freq: Every morning | ORAL | Status: DC
  Administered 2018-10-08 – 2018-10-10 (×3): 12.5 mg via ORAL
  Filled 2018-10-08 (×3): qty 1

## 2018-10-08 MED ORDER — IOHEXOL 350 MG/ML SOLN
75.0000 mL | Freq: Once | INTRAVENOUS | Status: AC | PRN
  Administered 2018-10-08: 02:00:00 100 mL via INTRAVENOUS

## 2018-10-08 MED ORDER — INSULIN ASPART 100 UNIT/ML ~~LOC~~ SOLN
0.0000 [IU] | Freq: Three times a day (TID) | SUBCUTANEOUS | Status: DC
  Administered 2018-10-08 – 2018-10-09 (×2): 2 [IU] via SUBCUTANEOUS
  Administered 2018-10-09 (×2): 1 [IU] via SUBCUTANEOUS
  Administered 2018-10-10 (×2): 2 [IU] via SUBCUTANEOUS
  Administered 2018-10-10: 08:00:00 1 [IU] via SUBCUTANEOUS

## 2018-10-08 MED ORDER — ACETAMINOPHEN 325 MG PO TABS: 650.0000 mg | ORAL_TABLET | Freq: Four times a day (QID) | ORAL | Status: DC | PRN

## 2018-10-08 MED ORDER — DULOXETINE HCL 60 MG PO CPEP
60.0000 mg | ORAL_CAPSULE | Freq: Every day | ORAL | Status: DC
  Administered 2018-10-08 – 2018-10-10 (×3): 60 mg via ORAL
  Filled 2018-10-08 (×3): qty 1

## 2018-10-08 MED ORDER — METOPROLOL TARTRATE 5 MG/5ML IV SOLN
5.0000 mg | Freq: Once | INTRAVENOUS | Status: AC
  Administered 2018-10-08: 5 mg via INTRAVENOUS
  Filled 2018-10-08: qty 5

## 2018-10-08 NOTE — ED Notes (Signed)
Dr. Ortiz in with pt at this time. 

## 2018-10-08 NOTE — ED Notes (Signed)
Patient transported to CT 

## 2018-10-08 NOTE — ED Notes (Signed)
Pt returned from xray,  

## 2018-10-08 NOTE — ED Notes (Signed)
Report given to RN on 300 

## 2018-10-08 NOTE — ED Notes (Signed)
Pt states she does not need to urinate at this time, aware of DO  

## 2018-10-08 NOTE — Progress Notes (Signed)
PROGRESS NOTE    Jennifer [name]  EVO:[id number] DOB: 1955/12/22 DOA: 10/07/2018 PCP: Abner Greenspan, MD    Brief Narrative:  62 year old female who presented with dizziness, nausea and vomiting.  She does have significant past medical history for coronary artery disease, dyslipidemia, hypertension, type 2 diabetes mellitus, hypothyroidism, and anxiety/depression.  Reported acute onset of vertigo, nausea and emesis, after waking up.  On her initial physical examination she was afebrile, pulse rate 85, respiratory rate 20, blood pressure 222/84, oxygen saturation 94%.  She was awake and alert, moist mucous membranes, lungs clear to auscultation bilaterally, heart S1-S2 present and rhythmic, abdomen soft nontender, 1+ lower extremity edema, neurologically patient was nonfocal.  Sodium 141, potassium 4.4, chloride 108, bicarb 26, glucose 214, creatinine 0.84, white count 7.9, hemoglobin 12.8, hematocrit 40.6, platelets 211.  Her chest radiograph was negative for infiltrates, chronic increased lung markings bilaterally.  Head CT with no acute changes, remote right basal ganglia lacunar infarct with a small remote left cerebellar infarct.  Head CT angiography with negative for large vessel occlusion.  CT of the abdomen with prominent perinephric fat stranding bilaterally.  EKG with 81 bpm, normal axis, normal intervals, J-point elevation V2 to V3, no T wave abnormalities, positive PVC.  Patient was admitted to the hospital working diagnosis of acute onset of vertigo.  Assessment & Plan:   Active Problems:   Diabetic peripheral neuropathy (HCC)   Hyperlipidemia LDL goal <100   Adjustment disorder with mixed anxiety and depressed mood   Essential hypertension   COPD (chronic obstructive pulmonary disease) (HCC)   Hypothyroidism   CAD S/P percutaneous coronary angioplasty   Acute vertigo with vomiting and inability to stand   Type 2 diabetes mellitus ([place])   Tobacco use   1. Vertigo. Patient with  improved symptoms but not yet back to baseline. 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 Will proceed to further work up with Brain MRI, will follow up on OT and PT recommendations. Continue as needed meclizine. As needed antiemetics.   2. T2DM. Will continue glucose cover and monitoring on insulin sliding scale. Patient is tolerating po well.  3. Hypothyroid. Continue with levothyroxine.   4. COPD. No clinical signs of exacerbation.  5. HTN. Continue blood pressure control with lisinopril, amlodipine and carvedilol.   7. Dyslipidemia. Continue with atorvastatin.   8. Hx of CVA in the past. No significant sequelae, continue aspirin, atorvastatin and blood pressure control.    DVT prophylaxis: enoxaparin   Code Status:  full Family Communication: no family at the bedside  Disposition Plan/ discharge barriers: pending clinical improvement.   Body mass index is 35.21 kg/m. Malnutrition Type:      Malnutrition Characteristics:      Nutrition Interventions:     RN Pressure Injury Documentation:     Consultants:     Procedures:     Antimicrobials:       Subjective: Positive symptoms with lateral head movement last night, no further nausea or vomiting, no chest pain or dyspnea.    Objective: Vitals:   10/08/18 0200 10/08/18 0430 10/08/18 0445 10/08/18 0544  BP: (!) 181/81 (!) 196/95  (!) 182/91  Pulse: 83 82 81 75  Resp: 15 13 14 17   Temp:    97.6 F (36.4 C)  TempSrc:    Oral  SpO2: 91% 91% 96% 94%  Weight:    102 kg  Height:    5\' 7"  (1.702 m)    Intake/Output Summary (Last 24 hours)  at 10/08/2018 0902 Last data filed at 10/08/2018 4970 Gross per 24 hour  Intake 1100 ml  Output 300 ml  Net 800 ml   Filed Weights   10/07/18 2341 10/08/18 0544  Weight: 104.3 kg 102 kg    Examination:   General: deconditioned  Neurology: Awake and alert, non focal. No nystagmus, no reproducible symptoms while with lateral head movement.    E ENT: no  pallor, no icterus, oral mucosa moist Cardiovascular: No JVD. S1-S2 present, rhythmic, no gallops, rubs, or murmurs. No lower extremity edema. Pulmonary: positive breath sounds bilaterally, adequate air movement, no wheezing, rhonchi or rales. Gastrointestinal. Abdomen with no organomegaly, non tender, no rebound or guarding Skin. No rashes Musculoskeletal: no joint deformities     Data Reviewed: I have personally reviewed following labs and imaging studies  CBC: Recent Labs  Lab 10/08/18 0045  WBC 7.9  NEUTROABS 6.8  HGB 12.8  HCT 40.6  MCV 86.9  PLT 263   Basic Metabolic Panel: Recent Labs  Lab 10/08/18 0045 10/08/18 0706  NA 141 140  K 4.4 3.7  CL 108 106  CO2 26 26  GLUCOSE 214* 187*  BUN 25* 20  CREATININE 0.84 0.78  CALCIUM 8.4* 8.4*   GFR: Estimated Creatinine Clearance: 88.4 mL/min (by C-G formula based on SCr of 0.78 mg/dL). Liver Function Tests: Recent Labs  Lab 10/08/18 0045 10/08/18 0706  AST 17 16  ALT 18 17  ALKPHOS 74 69  BILITOT 0.5 0.4  PROT 5.9* 5.6*  ALBUMIN 3.1* 2.9*   Recent Labs  Lab 10/08/18 0045  LIPASE 21   No results for input(s): AMMONIA in the last 168 hours. Coagulation Profile: No results for input(s): INR, PROTIME in the last 168 hours. Cardiac Enzymes: Recent Labs  Lab 10/08/18 0045 10/08/18 0706  TROPONINI <0.03 <0.03   BNP (last 3 results) No results for input(s): PROBNP in the last 8760 hours. HbA1C: No results for input(s): HGBA1C in the last 72 hours. CBG: Recent Labs  Lab 10/07/18 2352  GLUCAP 186*   Lipid Profile: No results for input(s): CHOL, HDL, LDLCALC, TRIG, CHOLHDL, LDLDIRECT in the last 72 hours. Thyroid Function Tests: No results for input(s): TSH, T4TOTAL, FREET4, T3FREE, THYROIDAB in the last 72 hours. Anemia Panel: No results for input(s): VITAMINB12, FOLATE, FERRITIN, TIBC, IRON, RETICCTPCT in the last 72 hours.    Radiology Studies: I have reviewed all of the imaging during this  hospital visit personally     Scheduled Meds: . amLODipine  10 mg Oral Daily  . aspirin EC  162 mg Oral Daily  . atorvastatin  80 mg Oral q morning - 10a  . buPROPion  150 mg Oral BID  . carvedilol  12.5 mg Oral q morning - 10a  . diclofenac  75 mg Oral BID WC  . DULoxetine  60 mg Oral Daily  . enoxaparin (LOVENOX) injection  40 mg Subcutaneous Q24H  . levothyroxine  137 mcg Oral Q0600  . linagliptin  5 mg Oral Daily  . lisinopril  40 mg Oral Daily   Continuous Infusions:   LOS: 0 days        Dayn Barich Gerome Apley, MD

## 2018-10-08 NOTE — Progress Notes (Signed)
Inpatient Diabetes Program Recommendations  AACE/ADA: New Consensus Statement on Inpatient Glycemic Control (2015)  Target Ranges:  Prepandial:   less than 140 mg/dL      Peak postprandial:   less than 180 mg/dL (1-2 hours)      Critically ill patients:  140 - 180 mg/dL   Lab Results  Component Value Date   GLUCAP 157 (H) 10/08/2018   HGBA1C 7.1 (H) 11/14/2017    Review of Glycemic Control Results for GEORGEANNA, RADZIEWICZ (MRN [id number]) as of 10/08/2018 12:08  Ref. Range 10/07/2018 23:52 10/08/2018 05:38  Glucose-Capillary Latest Ref Range: 70 - 99 mg/dL 186 (H) 157 (H)   Diabetes history: type 2 Dm Outpatient Diabetes medications: Humalog 1-40 units via pump, Januvia 100 mg QD Current orders for Inpatient glycemic control: Tradjenta 5 mg QD  Inpatient Diabetes Program Recommendations:    Spoke with patient and patient is not currently wearing her insulin pump. She does not have supplies for reapplication with her. In talking with her briefly, she is followed by Dr Chalmers Cater.   Consider adding Novolog 0-9 units TID & HS. If appropriate, last A1C was 11/2017, consider repeating A1C?  @1200 : Reached out to Dr Almetta Lovely office in an attempt to obtain previous insulin pump settings. Awaiting call back.  Will continue to follow.  Thanks, Bronson Curb, MSN, RNC-OB Diabetes Coordinator [phone] (8a-5p)

## 2018-10-08 NOTE — ED Notes (Signed)
CONTACT INFORMATION PT'S DAUGHTER  GINA Kawabata [phone]

## 2018-10-08 NOTE — Evaluation (Signed)
Physical Therapy Evaluation Patient Details Name: Jennifer [name] MRN: [id number] DOB: [birth year] Today's Date: 10/08/2018   History of Present Illness  Jennifer [name] is a 63 y.o. female with medical history significant of CAD, NSTEMI, hyperlipidemia, hypertension, type 2 diabetes, diabetic peripheral neuropathy, hypothyroidism, anxiety,/depression, tobacco use who is coming to the emergency department due to nausea, multiple episodes of emesis, a spinning sensation shortly after she woke up around noontime today.  She states that her symptoms got worse with spinning.  The patient states that she took all her medications on Saturday, went to work in the evening and returned home to sleep without any symptoms.  She denies headache, slurred speech, vision changes, but states she has had a lot of trouble with her gait, balance and ambulation.  She denies fever, chills, sore throat, wheezing, dyspnea, hemoptysis, chest pain, palpitations, diaphoresis, PND, orthopnea, but states she occasionally gets lower extremity edema.  She denies abdominal pain, diarrhea, constipation, melena, hematochezia, dysuria, frequency or hematuria.  She denies dysuria, frequency or hematuria.  She was given 4 mg of Zofran by EMS without significant improvement.    Clinical Impression  Patient very unsteady and nearly rolled off bed during supine to sitting, unable to take steps away from bedside due to poor standing balance with poor coordination of BLE, frequent leaning forward and to the right.  Patient most limited secondary to c/o severe nausea with dry heaving and put back to bed, nausea and dry heaving subsided after 5 minutes of lying down - RN notified.  Patient will benefit from continued physical therapy in hospital and recommended venue below to increase strength, balance, endurance for safe ADLs and gait.    Follow Up Recommendations SNF    Equipment Recommendations  None recommended by PT    Recommendations for  Other Services       Precautions / Restrictions Precautions Precautions: Fall Restrictions Weight Bearing Restrictions: No      Mobility  Bed Mobility Overal bed mobility: Needs Assistance Bed Mobility: Supine to Sit;Sit to Supine     Supine to sit: Min assist Sit to supine: Min assist   General bed mobility comments: labored movement, nearly rolled off bed when sitting up  Transfers Overall transfer level: Needs assistance Equipment used: Rolling walker (2 wheeled) Transfers: Sit to/from Stand Sit to Stand: Mod assist;Max assist            Ambulation/Gait Ambulation/Gait assistance: Max assist Gait Distance (Feet): 2 Feet Assistive device: Rolling walker (2 wheeled) Gait Pattern/deviations: Decreased step length - right;Decreased step length - left;Decreased stride length Gait velocity: slow   General Gait Details: limited to 2 shuffling side steps secondary to poor standing balance with frequent leaning forward and to the right  Stairs            Wheelchair Mobility    Modified Rankin (Stroke Patients Only)       Balance Overall balance assessment: Needs assistance Sitting-balance support: Feet supported;No upper extremity supported Sitting balance-Leahy Scale: Fair     Standing balance support: During functional activity;Bilateral upper extremity supported Standing balance-Leahy Scale: Poor Standing balance comment: using RW                             Pertinent Vitals/Pain Pain Assessment: No/denies pain    Home Living Family/patient expects to be discharged to:: Private residence Living Arrangements: Spouse/significant other;Children Available Help at Discharge: Family Type of Home: House Home Access:  Stairs to enter Entrance Stairs-Rails: Right;Left;Can reach both Entrance Stairs-Number of Steps: 5 Home Layout: One level Home Equipment: Cane - single point;Walker - 2 wheels;Bedside commode      Prior Function Level of  Independence: Independent         Comments: community ambulator, drives, works     Journalist, newspaper        Extremity/Trunk Assessment   Upper Extremity Assessment Upper Extremity Assessment: Defer to OT evaluation    Lower Extremity Assessment Lower Extremity Assessment: Generalized weakness    Cervical / Trunk Assessment Cervical / Trunk Assessment: Normal  Communication   Communication: No difficulties  Cognition Arousal/Alertness: Awake/alert Behavior During Therapy: WFL for tasks assessed/performed Overall Cognitive Status: Within Functional Limits for tasks assessed                                        General Comments      Exercises     Assessment/Plan    PT Assessment Patient needs continued PT services  PT Problem List Decreased strength;Decreased activity tolerance;Decreased balance;Decreased mobility       PT Treatment Interventions Therapeutic exercise;Gait training;Stair training;Functional mobility training;Therapeutic activities;Patient/family education    PT Goals (Current goals can be found in the Care Plan section)  Acute Rehab PT Goals Patient Stated Goal: return home after rehab PT Goal Formulation: With patient Time For Goal Achievement: 10/22/18 Potential to Achieve Goals: Good    Frequency Min 3X/week   Barriers to discharge        Co-evaluation               AM-PAC PT "6 Clicks" Mobility  Outcome Measure Help needed turning from your back to your side while in a flat bed without using bedrails?: A Little Help needed moving from lying on your back to sitting on the side of a flat bed without using bedrails?: A Little Help needed moving to and from a bed to a chair (including a wheelchair)?: A Lot Help needed standing up from a chair using your arms (e.g., wheelchair or bedside chair)?: A Lot Help needed to walk in hospital room?: Total Help needed climbing 3-5 steps with a railing? : Total 6 Click  Score: 12    End of Session Equipment Utilized During Treatment: Gait belt Activity Tolerance: Patient limited by fatigue(Patient limited by nausea, dizziness) Patient left: in bed;with call bell/phone within reach Nurse Communication: Mobility status PT Visit Diagnosis: Unsteadiness on feet (R26.81);Other abnormalities of gait and mobility (R26.89);Muscle weakness (generalized) (M62.81)    Time: 9169-4503 PT Time Calculation (min) (ACUTE ONLY): 30 min   Charges:   PT Evaluation $PT Eval Moderate Complexity: 1 Mod PT Treatments $Therapeutic Activity: 23-37 mins        2:38 PM, 10/08/18 Lonell Grandchild, MPT Physical Therapist with [facility] [phone] office [phone] mobile phone

## 2018-10-08 NOTE — ED Notes (Signed)
When pt urinated in bsc she had a bm as well, unable to collect urine sample

## 2018-10-08 NOTE — ED Notes (Signed)
PT'S daughter updated,

## 2018-10-08 NOTE — Plan of Care (Signed)
  Problem: Acute Rehab PT Goals(only PT should resolve) Goal: Pt Will Go Supine/Side To Sit Outcome: Progressing Flowsheets (Taken 10/08/2018 1442) Pt will go Supine/Side to Sit: with supervision Goal: Patient Will Transfer Sit To/From Stand Outcome: Progressing Flowsheets (Taken 10/08/2018 1442) Patient will transfer sit to/from stand: with minimal assist; with moderate assist Goal: Pt Will Transfer Bed To Chair/Chair To Bed Outcome: Progressing Flowsheets (Taken 10/08/2018 1442) Pt will Transfer Bed to Chair/Chair to Bed: with min assist; with mod assist Goal: Pt Will Ambulate Outcome: Progressing Flowsheets (Taken 10/08/2018 1442) Pt will Ambulate: 25 feet; with moderate assist; with rolling walker   2:43 PM, 10/08/18 Lonell Grandchild, MPT Physical Therapist with [facility] [phone] office [phone] mobile phone

## 2018-10-08 NOTE — H&P (Signed)
History and Physical    Jennifer [name] VWU:[id number] DOB: 1955/10/25 DOA: 10/07/2018  PCP: Abner [place]pan, MD   Patient coming from: Home.  I have personally briefly reviewed patient's old medical records in [place]  Chief Complaint: Dizziness, nausea and vomiting.  HPI: Jennifer [name] is a 63 y.o. female with medical history significant of CAD, NSTEMI, hyperlipidemia, hypertension, type 2 diabetes, diabetic peripheral neuropathy, hypothyroidism, anxiety,/depression, tobacco use who is coming to the emergency department due to nausea, multiple episodes of emesis, a spinning sensation shortly after she woke up around noontime today.  She states that her symptoms got worse with spinning.  The patient states that she took all her medications on Saturday, went to work in the evening and returned home to sleep without any symptoms.  She denies headache, slurred speech, vision changes, but states she has had a lot of trouble with her gait, balance and ambulation.  She denies fever, chills, sore throat, wheezing, dyspnea, hemoptysis, chest pain, palpitations, diaphoresis, PND, orthopnea, but states she occasionally gets lower extremity edema.  She denies abdominal pain, diarrhea, constipation, melena, hematochezia, dysuria, frequency or hematuria.  She denies dysuria, frequency or hematuria.  She was given 4 mg of Zofran by EMS without significant improvement.  ED Course: Initial vital signs temperature 97.7 F, pulse 85, respirations 20, blood pressure 222/84 mmHg and O2 sat 94% on room air.  The patient received 2000 mL of NS bolus in the emergency department.  I ordered 5 mg of metoprolol IVP x1 for hypertension.  The patient has not taken her antihypertensives since Saturday.  Her CBC, lactic acid lipase and troponin were within normal limits.  CMP shows normal electrolytes when calcium corrected to albumin.  Glucose 114, BUN 25 and creatinine 84 mg/dL.  Total protein was 5.9 and albumin 3.1  g/dL.  The rest of the hepatic functions are within normal limits.    Imaging is significant for bilateral perinephric stranding on CT abdomen/pelvis with contrast.  However, CT head, CTA head and neck did not show any acute on normalities.  Please see images and full radiology report for further detail..  Review of Systems: As per HPI otherwise 10 point review of systems negative.   Past Medical History:  Diagnosis Date   CAD (coronary artery disease)    cath 5/23 100% dist RCA lesion treated with 2 overlapping Integrity Resolute DES ( 2.25x6mm, 2.25x49mm), 60% mid RCA treated medically, 99% OM2 not amenable to PCI, 70% D1 lesion, EF normal   Hypercholesteremia    Hypertension    Thyroid disease    Type 2 diabetes mellitus ([place]) 10/08/2018    Past Surgical History:  Procedure Laterality Date   ABDOMINAL HYSTERECTOMY     CARDIAC CATHETERIZATION N/A 11/03/2015   Procedure: Left Heart Cath and Coronary Angiography;  Surgeon: Leonie Man, MD;  Location: [facility];  Service: Cardiovascular;  Laterality: N/A;   CARDIAC CATHETERIZATION N/A 11/03/2015   Procedure: Coronary Stent Intervention;  Surgeon: Leonie Man, MD;  Location: [facility];  Service: Cardiovascular;  Laterality: N/A;   CARPAL TUNNEL RELEASE       reports that she has been smoking cigarettes. She has been smoking about 0.25 packs per day. She has never used smokeless tobacco. She reports that she does not drink alcohol or use drugs.  No Known Allergies  Family History  Problem Relation Age of Onset   Heart disease Mother    Stroke Sister  Heart attack Brother    Prior to Admission medications   Medication Sig Start Date End Date Taking? Authorizing Provider  amLODipine (NORVASC) 10 MG tablet Take 1 tablet (10 mg total) by mouth daily for 30 days. 09/25/18 10/25/18  [name], [name], MD  aspirin 81 MG EC tablet Take 2 tablets (162 mg total) by mouth daily. 11/16/17   Johnson, Clanford L,  MD  atorvastatin (LIPITOR) 80 MG tablet Take 1 tablet (80 mg total) by mouth daily at 6 PM. Patient taking differently: Take 80 mg by mouth every morning.  03/15/18   Erlene Quan, PA-C  buPROPion (WELLBUTRIN SR) 150 MG 12 hr tablet Take 1 tablet by mouth twice daily 09/25/18   [name], [name], MD  carvedilol (COREG) 12.5 MG tablet Take 1 tablet (12.5 mg total) by mouth 2 (two) times daily with a meal. Patient taking differently: Take 12.5 mg by mouth every morning.  03/15/18   Erlene Quan, PA-C  diclofenac (VOLTAREN) 75 MG EC tablet TAKE 1 TABLET BY MOUTH TWICE DAILY WITH MEALS 09/24/18   Sanjuana Kava, MD  diclofenac sodium (VOLTAREN) 1 % GEL Apply 4 g topically 4 (four) times daily. Patient taking differently: Apply 4 g topically 4 (four) times daily as needed (joint pain).  05/02/18   Sanjuana Kava, MD  DULoxetine (CYMBALTA) 60 MG capsule Take 1 capsule by mouth once daily 09/25/18   [name], [name], MD  furosemide (LASIX) 20 MG tablet Take 1 tablet (20 mg total) by mouth daily. Patient taking differently: Take 20 mg by mouth daily as needed for fluid.  03/15/18   Erlene Quan, PA-C  HYDROcodone-acetaminophen (NORCO/VICODIN) 5-325 MG tablet One tablet by mouth every six hours as needed for pain. Must last 30 days 09/20/18   Sanjuana Kava, MD  insulin lispro (HUMALOG) 100 UNIT/ML injection Inject 40 Units into the skin daily. Via pump    [provider]  levothyroxine (SYNTHROID, LEVOTHROID) 112 MCG tablet Take 112 mcg by mouth daily before breakfast.     [provider]  lisinopril (PRINIVIL,ZESTRIL) 40 MG tablet Take 1 tablet (40 mg total) by mouth daily. 06/15/18   [name], [name], MD  nitroGLYCERIN (NITROSTAT) 0.4 MG SL tablet Place 1 tablet (0.4 mg total) under the tongue every 5 (five) minutes x 3 doses as needed for chest pain. 03/15/18   Erlene Quan, PA-C  sitaGLIPtin (JANUVIA) 100 MG tablet Take 100 mg by mouth daily.     [provider]    Physical  Exam: Vitals:   10/08/18 0000 10/08/18 0100 10/08/18 0130 10/08/18 0200  BP: (!) 190/93 (!) 176/73 (!) 171/81 (!) 181/81  Pulse: 82 85 84 83  Resp: 16 13 (!) 23 15  Temp:      TempSrc:      SpO2: 97% 92% 91% 91%  Weight:      Height:        Constitutional: NAD, calm, comfortable Eyes: PERRL, lids and conjunctivae normal ENMT: Mucous membranes are moist. Posterior pharynx clear of any exudate or lesions. Neck: normal, supple, no masses, no thyromegaly Respiratory: clear to auscultation bilaterally, no wheezing, no crackles. Normal respiratory effort. No accessory muscle use.  Cardiovascular: Regular rate and rhythm, no murmurs / rubs / gallops.  1+ lower extremities pitting edema. 2+ pedal pulses. No carotid bruits.  Abdomen: no tenderness, no masses palpated. No hepatosplenomegaly. Bowel sounds positive.  Musculoskeletal: no clubbing / cyanosis. Good ROM, no contractures. Normal muscle tone.  Skin: no  rashes, lesions, ulcers on limited dermatological examination. Neurologic: CN 2-12 grossly intact. Sensation intact, DTR normal. Strength 5/5 in all 4.  Gait examination was deferred. Psychiatric: Normal judgment and insight. Alert and oriented x 3. Normal mood.   Labs on Admission: I have personally reviewed following labs and imaging studies  CBC: Recent Labs  Lab 10/08/18 0045  WBC 7.9  NEUTROABS 6.8  HGB 12.8  HCT 40.6  MCV 86.9  PLT 967   Basic Metabolic Panel: Recent Labs  Lab 10/08/18 0045  NA 141  K 4.4  CL 108  CO2 26  GLUCOSE 214*  BUN 25*  CREATININE 0.84  CALCIUM 8.4*   GFR: Estimated Creatinine Clearance: 85.2 mL/min (by C-G formula based on SCr of 0.84 mg/dL). Liver Function Tests: Recent Labs  Lab 10/08/18 0045  AST 17  ALT 18  ALKPHOS 74  BILITOT 0.5  PROT 5.9*  ALBUMIN 3.1*   Recent Labs  Lab 10/08/18 0045  LIPASE 21   No results for input(s): AMMONIA in the last 168 hours. Coagulation Profile: No results for input(s): INR, PROTIME  in the last 168 hours. Cardiac Enzymes: Recent Labs  Lab 10/08/18 0045  TROPONINI <0.03   BNP (last 3 results) No results for input(s): PROBNP in the last 8760 hours. HbA1C: No results for input(s): HGBA1C in the last 72 hours. CBG: Recent Labs  Lab 10/07/18 2352  GLUCAP 186*   Lipid Profile: No results for input(s): CHOL, HDL, LDLCALC, TRIG, CHOLHDL, LDLDIRECT in the last 72 hours. Thyroid Function Tests: No results for input(s): TSH, T4TOTAL, FREET4, T3FREE, THYROIDAB in the last 72 hours. Anemia Panel: No results for input(s): VITAMINB12, FOLATE, FERRITIN, TIBC, IRON, RETICCTPCT in the last 72 hours. Urine analysis:    Component Value Date/Time   COLORURINE STRAW (A) 11/14/2017 1109   APPEARANCEUR CLEAR 11/14/2017 1109   LABSPEC 1.005 11/14/2017 1109   PHURINE 6.0 11/14/2017 1109   GLUCOSEU NEGATIVE 11/14/2017 1109   HGBUR NEGATIVE 11/14/2017 1109   [place] 11/14/2017 1109   BILIRUBINUR Negative 10/26/2017 0911   KETONESUR NEGATIVE 11/14/2017 1109   PROTEINUR 100 (A) 11/14/2017 1109   UROBILINOGEN 0.2 10/26/2017 0911   NITRITE NEGATIVE 11/14/2017 1109   LEUKOCYTESUR NEGATIVE 11/14/2017 1109    Radiological Exams on Admission: Ct Angio Head W Or Wo Contrast  Result Date: 10/08/2018 CLINICAL DATA:  Initial evaluation for persistent vertigo. EXAM: CT ANGIOGRAPHY HEAD AND NECK TECHNIQUE: Multidetector CT imaging of the head and neck was performed using the standard protocol during bolus administration of intravenous contrast. Multiplanar CT image reconstructions and MIPs were obtained to evaluate the vascular anatomy. Carotid stenosis measurements (when applicable) are obtained utilizing NASCET criteria, using the distal internal carotid diameter as the denominator. CONTRAST:  Seventy-five cc of Omnipaque 350. COMPARISON:  Comparison made with prior head CT from earlier the same day. FINDINGS: CTA NECK FINDINGS Aortic arch: Visualized aortic arch of normal  caliber with normal 3 vessel morphology. Mild scattered atheromatous plaque about the aortic arch and origin of the great vessels without hemodynamically significant stenosis. Visualized subclavian arteries widely patent. Right carotid system: Right common carotid artery patent from its origin to the bifurcation without stenosis. Mild atheromatous plaque about the right bifurcation without hemodynamically significant stenosis. Right ICA mildly tortuous but widely patent to the skull base without stenosis, dissection or occlusion. Left carotid system: Left common carotid artery patent from its origin to the bifurcation without stenosis. Minimal atheromatous plaque about the left bifurcation without hemodynamically significant stenosis. Left  ICA mildly tortuous but widely patent to the skull base without stenosis, dissection or occlusion. Vertebral arteries: Both of the vertebral arteries arise from the subclavian arteries. Vertebral arteries widely patent within the neck without stenosis, dissection, or occlusion. Skeleton: No acute osseous abnormality. No discrete lytic or blastic osseous lesions. Degenerative geode formation noted at the dens. Gas lucency within the C4 vertebral body likely degenerative in nature. Patient largely edentulous. Other neck: No other acute soft tissue abnormality within the neck. No adenopathy. Salivary glands normal. Subcentimeter hypodense right thyroid nodule noted, of doubtful significance. Upper chest: Visualized upper esophagus mildly patulous with layering fluid within the esophageal lumen. Visualized upper chest demonstrates no other acute finding. Visualized lungs are largely clear. Review of the MIP images confirms the above findings CTA HEAD FINDINGS Anterior circulation: Petrous segments widely patent bilaterally. Scattered calcified atheromatous plaque within the cavernous/supraclinoid ICAs with resultant mild to moderate narrowing (up to approximately 50%). ICA termini  well perfused. A1 segments patent bilaterally. Normal anterior communicating artery. Anterior cerebral arteries patent to their distal aspects without stenosis. M1 segments patent bilaterally. Normal MCA bifurcations. Distal MCA branches well perfused and symmetric. Posterior circulation: Vertebral arteries widely patent to the vertebrobasilar junction without stenosis. Posterior inferior cerebral arteries patent bilaterally. Basilar widely patent to its distal aspect. Superior cerebral arteries patent proximally. Both of the posterior cerebral arteries primarily supplied via the basilar. Both of the PCAs well perfused to their distal aspects without stenosis. Venous sinuses: Patent. Anatomic variants: None significant. Delayed phase: Not performed. Review of the MIP images confirms the above findings IMPRESSION: 1. Negative CTA for large vessel occlusion. No dissection or other acute vascular abnormality identified. 2. Atherosclerotic change within the carotid siphons with associated moderate multifocal narrowing. No other hemodynamically significant or correctable stenosis about the major arterial vasculature of the head and neck. Electronically Signed   By: Jeannine Boga M.D.   On: 10/08/2018 03:49   Ct Head Wo Contrast  Result Date: 10/08/2018 CLINICAL DATA:  Initial evaluation for acute altered mental status. EXAM: CT HEAD WITHOUT CONTRAST TECHNIQUE: Contiguous axial images were obtained from the base of the skull through the vertex without intravenous contrast. COMPARISON:  Prior CT from 06/16/2018. FINDINGS: Brain: Generalized age-related cerebral atrophy with moderate chronic small vessel ischemic disease. Remote lacunar infarct present at the right basal ganglia. Small remote left cerebellar infarct. No acute intracranial hemorrhage. No acute large vessel territory infarct. No mass lesion, midline shift or mass effect. No hydrocephalus. No extra-axial fluid collection. Vascular: No hyperdense  vessel. Scattered vascular calcifications noted within the carotid siphons. Skull: Scalp soft tissues within normal limits.  Calvarium intact. Sinuses/Orbits: Globes and orbital soft tissues demonstrate no acute finding. Tiny osteoma noted at the right ethmoidal air cells. Paranasal sinuses are clear. No mastoid effusion. Other: None. IMPRESSION: 1. No acute intracranial abnormality. 2. Remote right basal ganglia lacunar infarct, with additional small remote left cerebellar infarct. 3. Underlying age-related cerebral atrophy with moderate chronic small vessel ischemic disease. Electronically Signed   By: Jeannine Boga M.D.   On: 10/08/2018 00:38   Ct Angio Neck W And/or Wo Contrast  Result Date: 10/08/2018 CLINICAL DATA:  Initial evaluation for persistent vertigo. EXAM: CT ANGIOGRAPHY HEAD AND NECK TECHNIQUE: Multidetector CT imaging of the head and neck was performed using the standard protocol during bolus administration of intravenous contrast. Multiplanar CT image reconstructions and MIPs were obtained to evaluate the vascular anatomy. Carotid stenosis measurements (when applicable) are obtained utilizing NASCET criteria, using the  distal internal carotid diameter as the denominator. CONTRAST:  Seventy-five cc of Omnipaque 350. COMPARISON:  Comparison made with prior head CT from earlier the same day. FINDINGS: CTA NECK FINDINGS Aortic arch: Visualized aortic arch of normal caliber with normal 3 vessel morphology. Mild scattered atheromatous plaque about the aortic arch and origin of the great vessels without hemodynamically significant stenosis. Visualized subclavian arteries widely patent. Right carotid system: Right common carotid artery patent from its origin to the bifurcation without stenosis. Mild atheromatous plaque about the right bifurcation without hemodynamically significant stenosis. Right ICA mildly tortuous but widely patent to the skull base without stenosis, dissection or occlusion.  Left carotid system: Left common carotid artery patent from its origin to the bifurcation without stenosis. Minimal atheromatous plaque about the left bifurcation without hemodynamically significant stenosis. Left ICA mildly tortuous but widely patent to the skull base without stenosis, dissection or occlusion. Vertebral arteries: Both of the vertebral arteries arise from the subclavian arteries. Vertebral arteries widely patent within the neck without stenosis, dissection, or occlusion. Skeleton: No acute osseous abnormality. No discrete lytic or blastic osseous lesions. Degenerative geode formation noted at the dens. Gas lucency within the C4 vertebral body likely degenerative in nature. Patient largely edentulous. Other neck: No other acute soft tissue abnormality within the neck. No adenopathy. Salivary glands normal. Subcentimeter hypodense right thyroid nodule noted, of doubtful significance. Upper chest: Visualized upper esophagus mildly patulous with layering fluid within the esophageal lumen. Visualized upper chest demonstrates no other acute finding. Visualized lungs are largely clear. Review of the MIP images confirms the above findings CTA HEAD FINDINGS Anterior circulation: Petrous segments widely patent bilaterally. Scattered calcified atheromatous plaque within the cavernous/supraclinoid ICAs with resultant mild to moderate narrowing (up to approximately 50%). ICA termini well perfused. A1 segments patent bilaterally. Normal anterior communicating artery. Anterior cerebral arteries patent to their distal aspects without stenosis. M1 segments patent bilaterally. Normal MCA bifurcations. Distal MCA branches well perfused and symmetric. Posterior circulation: Vertebral arteries widely patent to the vertebrobasilar junction without stenosis. Posterior inferior cerebral arteries patent bilaterally. Basilar widely patent to its distal aspect. Superior cerebral arteries patent proximally. Both of the  posterior cerebral arteries primarily supplied via the basilar. Both of the PCAs well perfused to their distal aspects without stenosis. Venous sinuses: Patent. Anatomic variants: None significant. Delayed phase: Not performed. Review of the MIP images confirms the above findings IMPRESSION: 1. Negative CTA for large vessel occlusion. No dissection or other acute vascular abnormality identified. 2. Atherosclerotic change within the carotid siphons with associated moderate multifocal narrowing. No other hemodynamically significant or correctable stenosis about the major arterial vasculature of the head and neck. Electronically Signed   By: Jeannine Boga M.D.   On: 10/08/2018 03:49   Ct Abdomen Pelvis W Contrast  Result Date: 10/08/2018 CLINICAL DATA:  Initial evaluation for acute nausea, vomiting. EXAM: CT ABDOMEN AND PELVIS WITH CONTRAST TECHNIQUE: Multidetector CT imaging of the abdomen and pelvis was performed using the standard protocol following bolus administration of intravenous contrast. CONTRAST:  35mL OMNIPAQUE IOHEXOL 300 MG/ML  SOLN COMPARISON:  Prior radiograph from earlier the same day. FINDINGS: Lower chest: Mild scattered subsegmental atelectatic changes seen dependently within the lung bases bilaterally. Visualized lungs are otherwise clear. Hepatobiliary: Liver demonstrates a normal contrast enhanced appearance. Gallbladder surgically absent. No biliary dilatation. Pancreas: Pancreas within normal limits. Spleen: Ill-defined 12 mm hypodensity noted within the spleen, indeterminate, but of doubtful clinical significance. Spleen otherwise unremarkable. Adrenals/Urinary Tract: Adrenal glands within normal limits. Kidneys  equal in size with symmetric enhancement. Evaluation for nephrolithiasis limited due to timing of the contrast bolus, with the majority of the contrast in the renal collecting systems. No hydronephrosis or hydroureter. Prominent perinephric fat stranding seen bilaterally,  indeterminate. Bladder moderately distended without acute finding. Stomach/Bowel: Small hiatal hernia noted. Stomach decompressed without acute finding. No evidence for bowel obstruction. Normal appendix. Colonic diverticulosis without evidence for acute diverticulitis. No acute inflammatory changes seen about the bowels. Vascular/Lymphatic: Moderate aortic atherosclerosis. No aneurysm. Mesenteric vessels patent proximally. No pathologically enlarged intra-abdominopelvic lymph nodes. Reproductive: Uterus is absent.  Ovaries not confidently identified. Other: No free air or fluid. Musculoskeletal: No acute osseous finding. No discrete lytic or blastic osseous lesions. IMPRESSION: 1. Prominent perinephric fat stranding about the kidneys bilaterally. Findings are nonspecific, and could reflect sequelae of acute infection/pyelonephritis. Sequelae of acute or prior intrinsic renal injury could also be considered. Correlation with urinalysis recommended. No hydronephrosis or obstructive uropathy. 2. Colonic diverticulosis without evidence for acute diverticulitis. 3. Moderate aortic atherosclerosis. Electronically Signed   By: Jeannine Boga M.D.   On: 10/08/2018 03:48   Dg Abdomen Acute W/chest  Result Date: 10/08/2018 CLINICAL DATA:  63 year old female with dizziness, generalized weakness. EXAM: DG ABDOMEN ACUTE W/ 1V CHEST COMPARISON:  Portable chest 11/03/2015. Lumbar radiographs 01/19/2017. FINDINGS: Upright AP view of the chest. Lung volumes and mediastinal contours are within normal limits. Visualized tracheal air column is within normal limits. No pneumothorax or pneumoperitoneum. No pulmonary edema, pleural effusion or acute pulmonary opacity identified. Upright and supine views of the abdomen and pelvis. Non obstructed bowel gas pattern. Stable cholecystectomy clips. Abdominal and pelvic visceral contours are within normal limits. No acute osseous abnormality identified. IMPRESSION: 1. Normal bowel gas  pattern, no free air. 2. No acute cardiopulmonary abnormality. Electronically Signed   By: Genevie Ann M.D.   On: 10/08/2018 00:55   11/15/2017 echo ------------------------------------------------------------------- LV EF: 55% -   60%  ------------------------------------------------------------------- Indications:      TIA 435.9.  ------------------------------------------------------------------- History:   PMH:   Coronary artery disease.  Coronary artery disease.  Chronic obstructive pulmonary disease.  PMH:   Myocardial infarction.  Risk factors:  Current tobacco use. Hypertension. Diabetes mellitus. Dyslipidemia.  ------------------------------------------------------------------- Study Conclusions  - Left ventricle: The cavity size was normal. Wall thickness was   increased in a pattern of mild LVH. Systolic function was normal.   The estimated ejection fraction was in the range of 55% to 60%.   Doppler parameters are consistent with abnormal left ventricular   relaxation (grade 1 diastolic dysfunction). Doppler parameters   are consistent with high ventricular filling pressure. - Regional wall motion abnormality: Mild hypokinesis of the basal   inferoseptal and basal inferior myocardium. - Tricuspid valve: There was mild regurgitation  EKG: Independently reviewed Vent. rate 81 BPM PR interval * ms QRS duration 97 ms QT/QTc 407/473 ms P-R-T axes 82 63 83 Sinus rhythm Ventricular premature complex Nonspecific T abnormalities, lateral leads  Assessment/Plan Principal problem: Acute vertigo with vomiting and inability to stand Work-up has been benign so far. Observation/telemetry. Has received 2000 mL bolus of normal saline. Antiemetics as needed. Trial of benzodiazepine IVP. Trial of Antivert. Consider MRI of brain if no improvement.  Active Problems:   Diabetic peripheral neuropathy (HCC) Continue duloxetine 60 mg p.o. daily.    Hyperlipidemia LDL goal  <100 Continue atorvastatin 80 mg p.o. daily.    Adjustment disorder with mixed anxiety and depressed mood Continue Wellbutrin SR 150 mg p.o. twice daily.  Continue duloxetine 60 mg p.o. daily.    Essential hypertension Metoprolol 5 mg IVP x1 now. Once tolerating oral intake... Continue amlodipine 10 mg p.o. daily. Continue carvedilol 12.5 mg p.o. twice daily. Continue lisinopril 40 mg p.o. daily. Monitor BP, HR, BUN, creatinine and electrolytes.    COPD (chronic obstructive pulmonary disease) ([place]) Supplemental oxygen as needed. Bronchodilators as needed.    Hypothyroidism Normal TSH in June last year. However, dosage has increased from 112 to 137 mcg. Continue levothyroxine 137 mcg p.o. daily. Check TSH as needed.    CAD S/P percutaneous coronary angioplasty Smoking cessation advised. Resume carvedilol, aspirin and atorvastatin. Sublingual nitroglycerin as needed.    Type 2 diabetes mellitus (HCC) Carbohydrate modified diet. CBG monitoring before meals. Continue Januvia 100 mg p.o. daily. Continue insulin pump.    Tobacco use Declined nicotine replacement therapy. Staff to provide tobacco cessation information.    Abnormal CT abdomen The patient had bilateral perinephric stranding and received ceftriaxone in the emergency department for possible pyelonephritis.  However her urinalysis still pending.  She denies having fever, chills, dysuria, frequency, hematuria or flank pain.  Will determining future use of ceftriaxone depending on urinalysis results.   DVT prophylaxis: Lovenox SQ. Code Status: Full code. Family Communication:  Disposition Plan: Observation for vertigo-like symptoms. Consults called:  Admission status: Observation/telemetry.   Reubin Milan MD [facility]  10/08/2018, 4:39 AM   This document was prepared using Dragon voice recognition software and may contain some unintended transcription errors.

## 2018-10-09 ENCOUNTER — Observation Stay (HOSPITAL_COMMUNITY): Payer: 59

## 2018-10-09 DIAGNOSIS — J449 Chronic obstructive pulmonary disease, unspecified: Secondary | ICD-10-CM | POA: Diagnosis not present

## 2018-10-09 DIAGNOSIS — I340 Nonrheumatic mitral (valve) insufficiency: Secondary | ICD-10-CM | POA: Diagnosis not present

## 2018-10-09 DIAGNOSIS — Z955 Presence of coronary angioplasty implant and graft: Secondary | ICD-10-CM | POA: Diagnosis not present

## 2018-10-09 DIAGNOSIS — Z9641 Presence of insulin pump (external) (internal): Secondary | ICD-10-CM | POA: Diagnosis present

## 2018-10-09 DIAGNOSIS — Z7982 Long term (current) use of aspirin: Secondary | ICD-10-CM | POA: Diagnosis not present

## 2018-10-09 DIAGNOSIS — Z823 Family history of stroke: Secondary | ICD-10-CM | POA: Diagnosis not present

## 2018-10-09 DIAGNOSIS — I639 Cerebral infarction, unspecified: Secondary | ICD-10-CM | POA: Diagnosis present

## 2018-10-09 DIAGNOSIS — E669 Obesity, unspecified: Secondary | ICD-10-CM | POA: Diagnosis present

## 2018-10-09 DIAGNOSIS — E7800 Pure hypercholesterolemia, unspecified: Secondary | ICD-10-CM | POA: Diagnosis present

## 2018-10-09 DIAGNOSIS — E039 Hypothyroidism, unspecified: Secondary | ICD-10-CM | POA: Diagnosis present

## 2018-10-09 DIAGNOSIS — R270 Ataxia, unspecified: Secondary | ICD-10-CM | POA: Diagnosis not present

## 2018-10-09 DIAGNOSIS — Z9049 Acquired absence of other specified parts of digestive tract: Secondary | ICD-10-CM | POA: Diagnosis not present

## 2018-10-09 DIAGNOSIS — Z6835 Body mass index (BMI) 35.0-35.9, adult: Secondary | ICD-10-CM | POA: Diagnosis not present

## 2018-10-09 DIAGNOSIS — Z79899 Other long term (current) drug therapy: Secondary | ICD-10-CM | POA: Diagnosis not present

## 2018-10-09 DIAGNOSIS — F17210 Nicotine dependence, cigarettes, uncomplicated: Secondary | ICD-10-CM | POA: Diagnosis present

## 2018-10-09 DIAGNOSIS — E1142 Type 2 diabetes mellitus with diabetic polyneuropathy: Secondary | ICD-10-CM | POA: Diagnosis not present

## 2018-10-09 DIAGNOSIS — E1151 Type 2 diabetes mellitus with diabetic peripheral angiopathy without gangrene: Secondary | ICD-10-CM | POA: Diagnosis present

## 2018-10-09 DIAGNOSIS — Z794 Long term (current) use of insulin: Secondary | ICD-10-CM | POA: Diagnosis not present

## 2018-10-09 DIAGNOSIS — R42 Dizziness and giddiness: Secondary | ICD-10-CM | POA: Diagnosis not present

## 2018-10-09 DIAGNOSIS — F4323 Adjustment disorder with mixed anxiety and depressed mood: Secondary | ICD-10-CM | POA: Diagnosis present

## 2018-10-09 DIAGNOSIS — R29701 NIHSS score 1: Secondary | ICD-10-CM | POA: Diagnosis present

## 2018-10-09 DIAGNOSIS — E785 Hyperlipidemia, unspecified: Secondary | ICD-10-CM | POA: Diagnosis present

## 2018-10-09 DIAGNOSIS — Z8249 Family history of ischemic heart disease and other diseases of the circulatory system: Secondary | ICD-10-CM | POA: Diagnosis not present

## 2018-10-09 DIAGNOSIS — R112 Nausea with vomiting, unspecified: Secondary | ICD-10-CM | POA: Diagnosis present

## 2018-10-09 DIAGNOSIS — Z8673 Personal history of transient ischemic attack (TIA), and cerebral infarction without residual deficits: Secondary | ICD-10-CM | POA: Diagnosis not present

## 2018-10-09 DIAGNOSIS — Z79890 Hormone replacement therapy (postmenopausal): Secondary | ICD-10-CM | POA: Diagnosis not present

## 2018-10-09 DIAGNOSIS — I252 Old myocardial infarction: Secondary | ICD-10-CM | POA: Diagnosis not present

## 2018-10-09 DIAGNOSIS — I10 Essential (primary) hypertension: Secondary | ICD-10-CM | POA: Diagnosis present

## 2018-10-09 DIAGNOSIS — I2510 Atherosclerotic heart disease of native coronary artery without angina pectoris: Secondary | ICD-10-CM | POA: Diagnosis not present

## 2018-10-09 LAB — BASIC METABOLIC PANEL
Anion gap: 8 (ref 5–15)
BUN: 18 mg/dL (ref 8–23)
CO2: 26 mmol/L (ref 22–32)
Calcium: 8.6 mg/dL — ABNORMAL LOW (ref 8.9–10.3)
Chloride: 107 mmol/L (ref 98–111)
Creatinine, Ser: 0.83 mg/dL (ref 0.44–1.00)
GFR calc Af Amer: >60 mL/min (ref >60)
GFR calc non Af Amer: >60 mL/min (ref >60)
Glucose, Bld: 129 mg/dL — ABNORMAL HIGH (ref 70–99)
Potassium: 3.5 mmol/L (ref 3.5–5.1)
Sodium: 141 mmol/L (ref 135–145)

## 2018-10-09 LAB — GLUCOSE, CAPILLARY
Glucose-Capillary: 151 mg/dL — ABNORMAL HIGH (ref 70–99)
Glucose-Capillary: 129 mg/dL — ABNORMAL HIGH (ref 70–99)
Glucose-Capillary: 125 mg/dL — ABNORMAL HIGH (ref 70–99)

## 2018-10-09 LAB — HEMOGLOBIN A1C
Hgb A1c MFr Bld: 6.4 % — ABNORMAL HIGH (ref 4.8–5.6)
Mean Plasma Glucose: 136.98 mg/dL

## 2018-10-09 MED ORDER — CLOPIDOGREL BISULFATE 75 MG PO TABS
75.0000 mg | ORAL_TABLET | Freq: Every day | ORAL | Status: DC
  Administered 2018-10-10: 75 mg via ORAL
  Filled 2018-10-09: qty 1

## 2018-10-09 MED ORDER — GADOBUTROL 1 MMOL/ML IV SOLN
10.0000 mL | Freq: Once | INTRAVENOUS | Status: AC | PRN
  Administered 2018-10-09: 09:00:00 10 mL via INTRAVENOUS

## 2018-10-09 NOTE — Evaluation (Signed)
Clinical/Bedside Swallow Evaluation Patient Details  Name: Jennifer [name] MRN: [id number] Date of Birth: 1955/10/27  Today's Date: 10/09/2018 Time: SLP Start Time (ACUTE ONLY): 1155 SLP Stop Time (ACUTE ONLY): 1215 SLP Time Calculation (min) (ACUTE ONLY): 20 min  Past Medical History:  Past Medical History:  Diagnosis Date  . CAD (coronary artery disease)    cath 5/23 100% dist RCA lesion treated with 2 overlapping Integrity Resolute DES ( 2.25x3mm, 2.25x63mm), 60% mid RCA treated medically, 99% OM2 not amenable to PCI, 70% D1 lesion, EF normal  . Hypercholesteremia   . Hypertension   . Thyroid disease   . Type 2 diabetes mellitus ([place]) 10/08/2018   Past Surgical History:  Past Surgical History:  Procedure Laterality Date  . ABDOMINAL HYSTERECTOMY    . CARDIAC CATHETERIZATION N/A 11/03/2015   Procedure: Left Heart Cath and Coronary Angiography;  Surgeon: Leonie Man, MD;  Location: [facility];  Service: Cardiovascular;  Laterality: N/A;  . CARDIAC CATHETERIZATION N/A 11/03/2015   Procedure: Coronary Stent Intervention;  Surgeon: Leonie Man, MD;  Location: [facility];  Service: Cardiovascular;  Laterality: N/A;  . CARPAL TUNNEL RELEASE     HPI:  63 year old female who presented with dizziness, nausea and vomiting.  She does have significant past medical history for coronary artery disease, dyslipidemia, hypertension, type 2 diabetes mellitus, hypothyroidism, and anxiety/depression.  Reported acute onset of vertigo, nausea and emesis, after waking up.  On her initial physical examination she was afebrile, pulse rate 85, respiratory rate 20, blood pressure 222/84, oxygen saturation 94%.  She was awake and alert, moist mucous membranes, lungs clear to auscultation bilaterally, heart S1-S2 present and rhythmic, abdomen soft nontender, 1+ lower extremity edema, neurologically patient was nonfocal.  Sodium 141, potassium 4.4, chloride 108, bicarb 26, glucose 214,  creatinine 0.84, white count 7.9, hemoglobin 12.8, hematocrit 40.6, platelets 211.  Her chest radiograph was negative for infiltrates, chronic increased lung markings bilaterally.  Head CT with no acute changes, remote right basal ganglia lacunar infarct with a small remote left cerebellar infarct.  Head CT angiography with negative for large vessel occlusion.  CT of the abdomen with prominent perinephric fat stranding bilaterally.  EKG with 81 bpm, normal axis, normal intervals, J-point elevation V2 to V3, no T wave abnormalities, positive PVC.   Assessment / Plan / Recommendation Clinical Impression  Clinical swallow evaluation completed and Pt observed self feeding lunch meal. Oral motor evaluation is WNL. Pt shows no overt signs or symptoms of aspiration with lunch meal. Upon SLP arrival, Pt was eating reclined in bed so she was repositioned upright. Her primary complaint is nausea. She denies changes in swallowing, speech, language, or cognition. Continue diet as ordered. Above to RN.    SLP Visit Diagnosis: Dysphagia, unspecified (R13.10)    Aspiration Risk  No limitations    Diet Recommendation Regular;Thin liquid   Liquid Administration via: Cup;Straw Medication Administration: Whole meds with liquid Supervision: Patient able to self feed Postural Changes: Seated upright at 90 degrees;Remain upright for at least 30 minutes after po intake    Other  Recommendations Oral Care Recommendations: Oral care BID Other Recommendations: Clarify dietary restrictions   Follow up Recommendations None      Frequency and Duration            Prognosis Prognosis for Safe Diet Advancement: Good      Swallow Study   General Date of Onset: 10/08/18 HPI: 63 year old female who presented with dizziness, nausea and  vomiting.  She does have significant past medical history for coronary artery disease, dyslipidemia, hypertension, type 2 diabetes mellitus, hypothyroidism, and anxiety/depression.   Reported acute onset of vertigo, nausea and emesis, after waking up.  On her initial physical examination she was afebrile, pulse rate 85, respiratory rate 20, blood pressure 222/84, oxygen saturation 94%.  She was awake and alert, moist mucous membranes, lungs clear to auscultation bilaterally, heart S1-S2 present and rhythmic, abdomen soft nontender, 1+ lower extremity edema, neurologically patient was nonfocal.  Sodium 141, potassium 4.4, chloride 108, bicarb 26, glucose 214, creatinine 0.84, white count 7.9, hemoglobin 12.8, hematocrit 40.6, platelets 211.  Her chest radiograph was negative for infiltrates, chronic increased lung markings bilaterally.  Head CT with no acute changes, remote right basal ganglia lacunar infarct with a small remote left cerebellar infarct.  Head CT angiography with negative for large vessel occlusion.  CT of the abdomen with prominent perinephric fat stranding bilaterally.  EKG with 81 bpm, normal axis, normal intervals, J-point elevation V2 to V3, no T wave abnormalities, positive PVC. Type of Study: Bedside Swallow Evaluation Previous Swallow Assessment: None on record Diet Prior to this Study: Regular;Thin liquids Temperature Spikes Noted: No Respiratory Status: Room air History of Recent Intubation: No Behavior/Cognition: Alert;Cooperative;Pleasant mood Oral Cavity Assessment: Within Functional Limits Oral Care Completed by SLP: No Oral Cavity - Dentition: Adequate natural dentition Vision: Functional for self-feeding Self-Feeding Abilities: Able to feed self Patient Positioning: Upright in bed Baseline Vocal Quality: Normal Volitional Cough: Strong Volitional Swallow: Able to elicit    Oral/Motor/Sensory Function Overall Oral Motor/Sensory Function: Within functional limits   Ice Chips Ice chips: Within functional limits Presentation: Spoon   Thin Liquid Thin Liquid: Within functional limits Presentation: Cup;Self Fed;Straw    Nectar Thick Nectar Thick  Liquid: Not tested   Honey Thick Honey Thick Liquid: Not tested   Puree Puree: Not tested   Solid     Solid: Within functional limits Presentation: Self Fed     Thank you,  Genene Churn, [place] 10/09/2018,12:17 PM

## 2018-10-09 NOTE — Progress Notes (Addendum)
PROGRESS NOTE    Jennifer [name]  OFB:[id number] DOB: [birth year] DOA: 10/07/2018 PCP: Abner Greenspan, MD    Brief Narrative:  63 year old female who presented with dizziness, nausea and vomiting.  She does have significant past medical history for coronary artery disease, dyslipidemia, hypertension, type 2 diabetes mellitus, hypothyroidism, and anxiety/depression.  Reported acute onset of vertigo, nausea and emesis, after waking up.  On her initial physical examination she was afebrile, pulse rate 85, respiratory rate 20, blood pressure 222/84, oxygen saturation 94%.  She was awake and alert, moist mucous membranes, lungs clear to auscultation bilaterally, heart S1-S2 present and rhythmic, abdomen soft nontender, 1+ lower extremity edema, neurologically patient was nonfocal.  Sodium 141, potassium 4.4, chloride 108, bicarb 26, glucose 214, creatinine 0.84, white count 7.9, hemoglobin 12.8, hematocrit 40.6, platelets 211.  Her chest radiograph was negative for infiltrates, chronic increased lung markings bilaterally.  Head CT with no acute changes, remote right basal ganglia lacunar infarct with a small remote left cerebellar infarct.  Head CT angiography with negative for large vessel occlusion.  CT of the abdomen with prominent perinephric fat stranding bilaterally.  EKG with 81 bpm, normal axis, normal intervals, J-point elevation V2 to V3, no T wave abnormalities, positive PVC.  Patient was admitted to the hospital working diagnosis of acute onset of vertigo   Assessment & Plan:   Active Problems:   Diabetic peripheral neuropathy (HCC)   Hyperlipidemia LDL goal <100   Adjustment disorder with mixed anxiety and depressed mood   Essential hypertension   COPD (chronic obstructive pulmonary disease) (HCC)   Hypothyroidism   CAD S/P percutaneous coronary angioplasty   Acute vertigo with vomiting and inability to stand   Type 2 diabetes mellitus ([place])   Tobacco use   1. Vertigo/ acute CVA.  Her brain MRI, was abnormal with lesion in the lateral corpus callosum, possible CVA. Will proceed with further work up with contrast MRI, carotic [place] and echocardiography. Will continue aspirin and atorvastatin. Continue blood pressure control. Follow with neurology recommendations. Will continue physical therapy and occupational therapy. As needed meclizine. Follow up with carotid dopplers and echocardiography. Hgb A 1 C 6,4. Will follow on lipid panel in am.   2. T2DM. Continue glucose cover and monitoring on insulin sliding scale.  3. Hypothyroid. On levothyroxine.   4. COPD. No clinical signs of exacerbation  5. HTN. On lisinopril, amlodipine and carvedilol for blood pressure control.   7. Dyslipidemia. On atorvastatin.   8. Hx of CVA in the past. Old CVA per imaging.   9. Depression. Continue bupropion and duloxetine.   10. Obesity. Calculated BMI is 35,2. Will need outpatient follow up.   DVT prophylaxis: enoxaparin   Code Status:  full Family Communication: no family at the bedside  Disposition Plan/ discharge barriers: pending clinical improvement.   Body mass index is 35.21 kg/m. Malnutrition Type:      Malnutrition Characteristics:      Nutrition Interventions:     RN Pressure Injury Documentation:     Consultants:   Neurology   Procedures:     Antimicrobials:       Subjective: Patient continue to have vertigo with lateral head movement, associated with nausea and decreased balance. No chest pain or dyspnea.   Objective: Vitals:   10/08/18 1336 10/08/18 2031 10/08/18 2115 10/09/18 0634  BP: (!) 180/71  (!) 177/72 (!) 167/67  Pulse: 80  79 83  Resp: 18   17  Temp: 98.5 F (36.9 C)  97.8 F (36.6 C) 98.6 F (37 C)  TempSrc: Oral  Oral Oral  SpO2: 95% (!) 88% 94% 95%  Weight:      Height:        Intake/Output Summary (Last 24 hours) at 10/09/2018 0816 Last data filed at 10/09/2018 0300 Gross per 24 hour  Intake 360 ml  Output  300 ml  Net 60 ml   Filed Weights   10/07/18 2341 10/08/18 0544  Weight: 104.3 kg 102 kg    Examination:   General: deconditioned  Neurology: Awake and alert, non focal, reproducible vertigo with passive lateral head movements. No nystagmus noted.   E ENT: mild pallor, no icterus, oral mucosa moist Cardiovascular: No JVD. S1-S2 present, rhythmic, no gallops, rubs, or murmurs. No lower extremity edema. Pulmonary: positive breath sounds bilaterally, adequate air movement, no wheezing, rhonchi or rales. Gastrointestinal. Abdomen with no organomegaly, non tender, no rebound or guarding Skin. No rashes Musculoskeletal: no joint deformities     Data Reviewed: I have personally reviewed following labs and imaging studies  CBC: Recent Labs  Lab 10/08/18 0045  WBC 7.9  NEUTROABS 6.8  HGB 12.8  HCT 40.6  MCV 86.9  PLT 185   Basic Metabolic Panel: Recent Labs  Lab 10/08/18 0045 10/08/18 0706 10/09/18 0558  NA 141 140 141  K 4.4 3.7 3.5  CL 108 106 107  CO2 26 26 26   GLUCOSE 214* 187* 129*  BUN 25* 20 18  CREATININE 0.84 0.78 0.83  CALCIUM 8.4* 8.4* 8.6*   GFR: Estimated Creatinine Clearance: 85.2 mL/min (by C-G formula based on SCr of 0.83 mg/dL). Liver Function Tests: Recent Labs  Lab 10/08/18 0045 10/08/18 0706  AST 17 16  ALT 18 17  ALKPHOS 74 69  BILITOT 0.5 0.4  PROT 5.9* 5.6*  ALBUMIN 3.1* 2.9*   Recent Labs  Lab 10/08/18 0045  LIPASE 21   No results for input(s): AMMONIA in the last 168 hours. Coagulation Profile: No results for input(s): INR, PROTIME in the last 168 hours. Cardiac Enzymes: Recent Labs  Lab 10/08/18 0045 10/08/18 0706 10/08/18 1252  TROPONINI <0.03 <0.03 <0.03   BNP (last 3 results) No results for input(s): PROBNP in the last 8760 hours. HbA1C: No results for input(s): HGBA1C in the last 72 hours. CBG: Recent Labs  Lab 10/07/18 2352 10/08/18 0538 10/08/18 1656 10/08/18 2242  GLUCAP 186* 157* 161* 123*   Lipid  Profile: No results for input(s): CHOL, HDL, LDLCALC, TRIG, CHOLHDL, LDLDIRECT in the last 72 hours. Thyroid Function Tests: No results for input(s): TSH, T4TOTAL, FREET4, T3FREE, THYROIDAB in the last 72 hours. Anemia Panel: No results for input(s): VITAMINB12, FOLATE, FERRITIN, TIBC, IRON, RETICCTPCT in the last 72 hours.    Radiology Studies: I have reviewed all of the imaging during this hospital visit personally     Scheduled Meds: . amLODipine  10 mg Oral Daily  . aspirin EC  162 mg Oral Daily  . atorvastatin  80 mg Oral q morning - 10a  . buPROPion  150 mg Oral BID  . carvedilol  12.5 mg Oral q morning - 10a  . diclofenac  75 mg Oral BID WC  . DULoxetine  60 mg Oral Daily  . enoxaparin (LOVENOX) injection  40 mg Subcutaneous Q24H  . insulin aspart  0-9 Units Subcutaneous TID WC  . levothyroxine  137 mcg Oral Q0600  . lisinopril  40 mg Oral Daily   Continuous Infusions:   LOS: 0 days  Mauricio Gerome Apley, MD

## 2018-10-09 NOTE — Evaluation (Signed)
Occupational Therapy Evaluation Patient Details Name: Jennifer [name] MRN: [id number] DOB: December 28, 1955 Today's Date: 10/09/2018    History of Present Illness Jennifer [name] is a 63 y.o. female with medical history significant of CAD, NSTEMI, hyperlipidemia, hypertension, type 2 diabetes, diabetic peripheral neuropathy, hypothyroidism, anxiety,/depression, tobacco use who is coming to the emergency department due to nausea, multiple episodes of emesis, a spinning sensation shortly after she woke up around noontime today.  She states that her symptoms got worse with spinning.  The patient states that she took all her medications on Saturday, went to work in the evening and returned home to sleep without any symptoms.  She denies headache, slurred speech, vision changes, but states she has had a lot of trouble with her gait, balance and ambulation.  She denies fever, chills, sore throat, wheezing, dyspnea, hemoptysis, chest pain, palpitations, diaphoresis, PND, orthopnea, but states she occasionally gets lower extremity edema.  She denies abdominal pain, diarrhea, constipation, melena, hematochezia, dysuria, frequency or hematuria.  She denies dysuria, frequency or hematuria.  She was given 4 mg of Zofran by EMS without significant improvement.   Clinical Impression   Pt agreeable to OT evaluation this am, reports improvement in dizziness. Pt performing ADLs in sitting with increased time. No dizziness with position changes-supine to sit, sit to stand, sit to supine. Pt primarily limited by nausea and vomiting during session. No further OT services at this time as pt is at her baseline with functional task performance, limited by N/V.     Follow Up Recommendations  No OT follow up    Equipment Recommendations  None recommended by OT       Precautions / Restrictions Precautions Precautions: Fall Restrictions Weight Bearing Restrictions: No      Mobility Bed Mobility Overal bed mobility: Needs  Assistance Bed Mobility: Supine to Sit;Sit to Supine     Supine to sit: Supervision Sit to supine: Modified independent (Device/Increase time)      Transfers Overall transfer level: Needs assistance Equipment used: Rolling walker (2 wheeled) Transfers: Sit to/from Stand Sit to Stand: Min guard         General transfer comment: No dizziness upon standing        ADL either performed or assessed with clinical judgement   ADL Overall ADL's : Needs assistance/impaired Eating/Feeding: Modified independent;Bed level   Grooming: Set up;Sitting               Lower Body Dressing: Supervision/safety;Sitting/lateral leans                 General ADL Comments: Pt primarily limited by nausea/vomiting     Vision Baseline Vision/History: No visual deficits Patient Visual Report: No change from baseline Vision Assessment?: No apparent visual deficits            Pertinent Vitals/Pain Pain Assessment: No/denies pain     Hand Dominance Right   Extremity/Trunk Assessment Upper Extremity Assessment Upper Extremity Assessment: Overall WFL for tasks assessed   Lower Extremity Assessment Lower Extremity Assessment: Defer to PT evaluation   Cervical / Trunk Assessment Cervical / Trunk Assessment: Normal   Communication Communication Communication: No difficulties   Cognition Arousal/Alertness: Awake/alert Behavior During Therapy: WFL for tasks assessed/performed Overall Cognitive Status: Within Functional Limits for tasks assessed  Home Living Family/patient expects to be discharged to:: Private residence Living Arrangements: Spouse/significant other;Children Available Help at Discharge: Family Type of Home: House Home Access: Stairs to enter Technical brewer of Steps: 5 Entrance Stairs-Rails: Right;Left;Can reach both [place]: One level     Bathroom Shower/Tub: Arts administrator: [place] - single point;Walker - 2 wheels;Bedside commode          Prior Functioning/Environment Level of Independence: Independent        Comments: community ambulator, drives, works        OT Problem List: Impaired balance (sitting and/or standing)       AM-PAC OT "6 Clicks" Daily Activity     Outcome Measure Help from another person eating meals?: None Help from another person taking care of personal grooming?: None Help from another person toileting, which includes using toliet, bedpan, or urinal?: None Help from another person bathing (including washing, rinsing, drying)?: None Help from another person to put on and taking off regular upper body clothing?: None Help from another person to put on and taking off regular lower body clothing?: None 6 Click Score: 24   End of Session Equipment Utilized During Treatment: Gait belt;Rolling walker  Activity Tolerance: Patient tolerated treatment well Patient left: in bed;with call bell/phone within reach;with bed alarm set  OT Visit Diagnosis: Unsteadiness on feet (R26.81)                Time: 7616-0737 OT Time Calculation (min): 16 min Charges:  OT General Charges $OT Visit: 1 Visit OT Evaluation $OT Eval Low Complexity: [place], OTR/L  [phone] 10/09/2018, 8:25 AM

## 2018-10-09 NOTE — TOC Initial Note (Signed)
Transition of Care [facility]) - Initial/Assessment Note    Patient Details  Name: Jennifer [name] MRN: [id number] Date of Birth: 04-05-1956  [facility] CM/SW Contact:    Sherald Barge, RN Phone Number: 10/09/2018, 11:25 AM  Clinical Narrative:           Admitted with CVA. From home with husband and daughter. Pt initially seen on 4/27. CM discussed PT's recommendation of STR. Pt has discussed with husband and has chosen to go home with [facility]. She has used [facility] in the past and requests to use them again. Pt has cane and walker pta. Needs shower stool. CM will cont to follow for DC planning needs.       Expected Discharge Plan: [place]     Patient Goals and CMS Choice Patient states their goals for this hospitalization and ongoing recovery are:: return home with hsuband CMS Medicare.gov Compare Post Acute Care list provided to:: Patient Choice offered to / list presented to : Patient  Expected Discharge Plan and Services Expected Discharge Plan: [place] Acute Care Choice: Home Health, Durable Medical Equipment Living arrangements for the past 2 months: [place]                 DME Arranged: Shower stool DME Agency: [employer] Date DME Agency Contacted: 10/09/18 Time DME Agency Contacted: 1517 Representative spoke with at DME Agency: Blake Divine [place]: RN, PT [place] Agency: [place] ([place]) Date [place]: 10/09/18 Time [place]: 6160 Representative spoke with at [place]: Romualdo Bolk  Prior Living Arrangements/Services Living arrangements for the past 2 months: [place] with:: Spouse, Adult Children Patient language and need for interpreter reviewed:: Yes Do you feel safe going back to the place where you live?: Yes      Need for Family Participation in Patient Care: Yes (Comment) Care giver support system in place?: Yes (comment) Current  home services: DME Criminal Activity/Legal Involvement Pertinent to Current Situation/Hospitalization: No - Comment as needed  Activities of Daily Living Home Assistive Devices/Equipment: CBG Meter, Dentures (specify type) ADL Screening (condition at time of admission) Patient's cognitive ability adequate to safely complete daily activities?: Yes Is the patient deaf or have difficulty hearing?: No Does the patient have difficulty seeing, even when wearing glasses/contacts?: No Does the patient have difficulty concentrating, remembering, or making decisions?: No Patient able to express need for assistance with ADLs?: Yes Does the patient have difficulty dressing or bathing?: No Independently performs ADLs?: No Communication: Independent Dressing (OT): Needs assistance Is this a change from baseline?: Change from baseline, expected to last <3days Grooming: Independent Feeding: Independent Bathing: Needs assistance Is this a change from baseline?: Change from baseline, expected to last <3 days Toileting: Needs assistance Is this a change from baseline?: Change from baseline, expected to last <3 days In/Out Bed: Needs assistance Is this a change from baseline?: Change from baseline, expected to last <3 days Walks in Home: Independent Does the patient have difficulty walking or climbing stairs?: Yes Weakness of Legs: None Weakness of Arms/Hands: None  Permission Sought/Granted Permission sought to share information with : Family Supports Permission granted to share information with : Yes, Verbal Permission Granted        Permission granted to share info w Relationship: Husband and daughter     Emotional Assessment Appearance:: Appears stated age Attitude/Demeanor/Rapport: Engaged Affect (typically observed): Appropriate, Pleasant, Calm  Orientation: : Oriented to Self, Oriented to Place, Oriented to  Time, Oriented to Situation Alcohol / Substance Use: Not Applicable Psych  Involvement: No (comment)  Admission diagnosis:  Intractable vomiting with nausea, unspecified vomiting type [R11.2] Acute vertigo with vomiting and inability to stand [R42, R11.10] Patient Active Problem List   Diagnosis Date Noted  . Acute vertigo with vomiting and inability to stand 10/08/2018  . Type 2 diabetes mellitus ([place]) 10/08/2018  . Tobacco use 10/08/2018  . Carotid artery disease ([place]) 03/15/2018  . Acute CVA (cerebrovascular accident) ([place]) 11/16/2017  . Rash and nonspecific skin eruption 11/09/2017  . Low back pain 10/26/2017  . Screening mammogram, encounter for 09/18/2017  . CAD S/P percutaneous coronary angioplasty 11/04/2015  . NSTEMI (non-ST elevated myocardial infarction) ([place]) 11/03/2015  . Hypothyroidism 07/06/2010  . Hyperlipidemia LDL goal <100 08/28/2008  . Essential hypertension 11/13/2007  . 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 Diabetic peripheral neuropathy ([place]) 09/21/2006  . Obesity (BMI 30-39.9) 09/21/2006  . TOBACCO ABUSE 09/21/2006  . Adjustment disorder with mixed anxiety and depressed mood 09/21/2006  . CARPAL TUNNEL SYNDROME 09/21/2006  . COPD (chronic obstructive pulmonary disease) ([place]) 09/21/2006  . EDEMA 09/21/2006  . MIGRAINES, HX OF 09/21/2006   PCP:  Abner Greenspan, MD Pharmacy:   [facility] [address] [place] [address] [place] [address] [place] [address] Phone: [phone] Fax: [phone]  Readmission Risk Interventions No flowsheet data found.

## 2018-10-09 NOTE — Progress Notes (Signed)
Physical Therapy Treatment Patient Details Name: Jennifer [name] MRN: [id number] DOB: 06-Apr-1956 Today's Date: 10/09/2018    History of Present Illness Jennifer [name] is a 63 y.o. female with medical history significant of CAD, NSTEMI, hyperlipidemia, hypertension, type 2 diabetes, diabetic peripheral neuropathy, hypothyroidism, anxiety,/depression, tobacco use who is coming to the emergency department due to nausea, multiple episodes of emesis, a spinning sensation shortly after she woke up around noontime today.  She states that her symptoms got worse with spinning.  The patient states that she took all her medications on Saturday, went to work in the evening and returned home to sleep without any symptoms.  She denies headache, slurred speech, vision changes, but states she has had a lot of trouble with her gait, balance and ambulation.  She denies fever, chills, sore throat, wheezing, dyspnea, hemoptysis, chest pain, palpitations, diaphoresis, PND, orthopnea, but states she occasionally gets lower extremity edema.  She denies abdominal pain, diarrhea, constipation, melena, hematochezia, dysuria, frequency or hematuria.  She denies dysuria, frequency or hematuria.  She was given 4 mg of Zofran by EMS without significant improvement.    PT Comments    Patient demonstrates increased endurance/distance for gait training with slow labored cadence with c/o feeling like she is leaning to the left when standing, limited mostly due to c/o nausea.  Prior to gait training sat up at bedside and completed BLE ROM/strengthening exercises without c/o nausea or dizziness for approximately 10 minutes.  At the end of gait training had patient stand without using an AD, then patient became nauseous and had to sit in chair, after sitting for a few minutes nausea subsided and patient tolerated staying up in chair - RN notified.  Patient will benefit from continued physical therapy in hospital and recommended venue below to  increase strength, balance, endurance for safe ADLs and gait.    Follow Up Recommendations  SNF;Supervision - Intermittent;Supervision for mobility/OOB     Equipment Recommendations       Recommendations for Other Services       Precautions / Restrictions Precautions Precautions: Fall Restrictions Weight Bearing Restrictions: No    Mobility  Bed Mobility Overal bed mobility: Needs Assistance Bed Mobility: Supine to Sit     Supine to sit: Supervision        Transfers Overall transfer level: Needs assistance Equipment used: Rolling walker (2 wheeled) Transfers: Sit to/from [facility] Sit to Stand: Min guard Stand pivot transfers: Min guard       General transfer comment: increased time, slightly labored movement  Ambulation/Gait Ambulation/Gait assistance: Min guard;Min assist Gait Distance (Feet): 30 Feet Assistive device: Rolling walker (2 wheeled) Gait Pattern/deviations: Decreased step length - right;Decreased step length - left;Decreased stride length Gait velocity: decreased   General Gait Details: increased endurance/distance for ambulation with slow labored cadence, slight feeling of leaning to the left per patient, limited mostly due to c/o nausea   Stairs             Wheelchair Mobility    Modified Rankin (Stroke Patients Only)       Balance Overall balance assessment: Needs assistance Sitting-balance support: Feet supported;No upper extremity supported Sitting balance-Leahy Scale: Good     Standing balance support: No upper extremity supported;During functional activity Standing balance-Leahy Scale: Poor Standing balance comment: fair using RW                            Cognition Arousal/Alertness: Awake/alert  Behavior During Therapy: WFL for tasks assessed/performed Overall Cognitive Status: Within Functional Limits for tasks assessed                                         Exercises General Exercises - Lower Extremity Long Arc Quad: Seated;Strengthening;AROM;Both;10 reps Hip Flexion/Marching: Seated;Strengthening;AROM;Both;10 reps Toe Raises: Seated;Strengthening;AROM;Both;10 reps Heel Raises: Seated;AROM;Strengthening;Both;10 reps    General Comments        Pertinent Vitals/Pain Pain Assessment: No/denies pain    Home Living                      Prior Function            PT Goals (current goals can now be found in the care plan section) Acute Rehab PT Goals Patient Stated Goal: return home after rehab PT Goal Formulation: With patient Time For Goal Achievement: 10/22/18 Potential to Achieve Goals: Good Progress towards PT goals: Progressing toward goals    Frequency    7X/week      PT Plan Current plan remains appropriate    Co-evaluation              AM-PAC PT "6 Clicks" Mobility   Outcome Measure  Help needed turning from your back to your side while in a flat bed without using bedrails?: None Help needed moving from lying on your back to sitting on the side of a flat bed without using bedrails?: None Help needed moving to and from a bed to a chair (including a wheelchair)?: A Little Help needed standing up from a chair using your arms (e.g., wheelchair or bedside chair)?: A Little Help needed to walk in hospital room?: A Lot Help needed climbing 3-5 steps with a railing? : A Lot 6 Click Score: 18    End of Session Equipment Utilized During Treatment: Gait belt Activity Tolerance: Patient tolerated treatment well;Patient limited by fatigue Patient left: in chair;with call bell/phone within reach Nurse Communication: Mobility status PT Visit Diagnosis: Unsteadiness on feet (R26.81);Other abnormalities of gait and mobility (R26.89);Muscle weakness (generalized) (M62.81)     Time: 0240-9735 PT Time Calculation (min) (ACUTE ONLY): 31 min  Charges:  $Gait Training: 8-22 mins $Therapeutic Exercise: 8-22  mins                     3:48 PM, 10/09/18 Lonell Grandchild, MPT Physical Therapist with [facility] 336 [phone] office [phone] mobile phone

## 2018-10-09 NOTE — Consult Note (Addendum)
[place] A. Merlene Laughter, MD     [email or web address]          Jennifer [name] is an 63 y.o. female.   ASSESSMENT/PLAN: 1.  Acute vertiginous symptoms, nausea vomiting and ataxia: The acute clinical onset and findings along with the MRI findings is most consistent with an acute ischemic stroke.  Although the MRI findings involve the corpus callosum, I think in the etiology is most likely ischemic as mentioned above and consistent with her clinical onset in symptoms and her previous ischemic events.  I would recommend that we do antiplatelet agents.  I recommend dual antiplatelet agents for 2 months and then switch the patient to Plavix.  Continue with other risk factor modifications.  2.  Diabetic polyneuropathy  The patient is a 63 year old white female who presents with the acute onset of spinning-like sensation, nausea vomiting and gait ataxia.  Symptoms have been present for several hours.  She reports some improvement today.  She did have emesis yesterday but does not report having emesis today.  She is on antiplatelet aspirin and has been compliant with this medication.  She previously had a infarct last year and reports she has been compliant with treatment.  The patient does not report having focal numbness or weakness.  She denies dyspnea, dysarthria or dysphasia.  She does not report having headaches, palpitations or chest pain.  The review of systems otherwise negative.  GENERAL: This is a pleasant moderately obese female in no acute distress.  HEENT: Neck is normal and no trauma appreciated.  ABDOMEN: soft  EXTREMITIES: No edema   BACK: Normal  SKIN: Normal by inspection.    MENTAL STATUS: Alert and mostly oriented although she states her age is 70.  She does state the month appropriately. Speech, language and cognition are generally intact. Judgment and insight normal.   CRANIAL NERVES: Pupils are equal, round and reactive to light and accomodation; extra  ocular movements are full, there is no significant nystagmus; visual fields are full; upper and lower facial muscles are normal in strength and symmetric, there is no flattening of the nasolabial folds; tongue is midline; uvula is midline; shoulder elevation is normal.  MOTOR: Normal tone, bulk and strength; no pronator drift.  There is no drift of the upper or lower extremities.  COORDINATION: Left finger to nose is normal, right finger to nose is normal, No rest tremor; no intention tremor; no postural tremor; no bradykinesia.  REFLEXES: Deep tendon reflexes are symmetrical and normal but markedly diminished in the legs trace requiring augmentation. Plantar reflexes are flexor bilaterally.   SENSATION: Normal to light touch, temperature, and pain.   NIH stroke scale 1.     NEUROLOGY CONSULTATION 2019: 1.  Acute left hemisensory impairment due to lacunar infarction: Symptomatic infarct is likely the right thalamic infarct.  The right posterior frontal/parietal infarct is most likely an symptomatic.  The presentation of 2 relatively simultaneous infarcts in different distribution raises the possibility of embolic phenomena but these are small vessel lacunar type infarcts which make embolic phenomena unlikely.  Risk factors age, diabetes, hypertension, dyslipidemia and coronary artery disease.  I recommend that the patient aspirin be increased to 162 mg.  Agree with the use of statin, blood pressure and diabetes control.  We likely should hold off on elective surgery for a month.   2.  Multiple asymptomatic remote lacunar infarcts:   The she reports some visual changes involving the left eye.  She patient is a 63 year old right-handed  white female who presented with acute onset of numbness involving primarily the left upper extremity but also the left leg.  Does have apparent bleeding in the retina on the left side along with cataracts.  She has had some that she has bilateral cataracts as  mentioned needs to be removed.  She actually scheduled for surgery this week.  The patient has had some ongoing blurring of vision on the left side.  This started before her current symptoms of left-sided numbness.  She does not reports any worsening of the numbness.  Her balance has been pretty steady an has not been affected with her current event.  She denies any dysarthria.  She denies any palpitations or chest pain with her current event.  She does not report having focal weakness.  Review of systems is otherwise negative    Blood pressure (!) 158/54, pulse 88, temperature 98.2 F (36.8 C), temperature source Oral, resp. rate 18, height 5\' 7"  (1.702 m), weight 102 kg, SpO2 91 %.  Past Medical History:  Diagnosis Date  . CAD (coronary artery disease)    cath 5/23 100% dist RCA lesion treated with 2 overlapping Integrity Resolute DES ( 2.25x74mm, 2.25x5mm), 60% mid RCA treated medically, 99% OM2 not amenable to PCI, 70% D1 lesion, EF normal  . Hypercholesteremia   . Hypertension   . Thyroid disease   . Type 2 diabetes mellitus ([place]) 10/08/2018    Past Surgical History:  Procedure Laterality Date  . ABDOMINAL HYSTERECTOMY    . CARDIAC CATHETERIZATION N/A 11/03/2015   Procedure: Left Heart Cath and Coronary Angiography;  Surgeon: Leonie Man, MD;  Location: [facility];  Service: Cardiovascular;  Laterality: N/A;  . CARDIAC CATHETERIZATION N/A 11/03/2015   Procedure: Coronary Stent Intervention;  Surgeon: Leonie Man, MD;  Location: [facility];  Service: Cardiovascular;  Laterality: N/A;  . CARPAL TUNNEL RELEASE      Family History  Problem Relation Age of Onset  . Heart disease Mother   . Stroke Sister   . Heart attack Brother     Social History:  reports that she has been smoking cigarettes. She has been smoking about 0.25 packs per day. She has never used smokeless tobacco. She reports that she does not drink alcohol or use drugs.  Allergies: No Known  Allergies  Medications: Prior to Admission medications   Medication Sig Start Date End Date Taking? Authorizing Provider  amLODipine (NORVASC) 10 MG tablet Take 1 tablet (10 mg total) by mouth daily for 30 days. 09/25/18 10/25/18 Yes [name], [name], MD  aspirin 81 MG EC tablet Take 2 tablets (162 mg total) by mouth daily. 11/16/17  Yes Johnson, Clanford L, MD  atorvastatin (LIPITOR) 80 MG tablet Take 1 tablet (80 mg total) by mouth daily at 6 PM. Patient taking differently: Take 80 mg by mouth every morning.  03/15/18  Yes Kilroy, Luke K, PA-C  buPROPion (WELLBUTRIN SR) 150 MG 12 hr tablet Take 1 tablet by mouth twice daily Patient taking differently: Take 150 mg by mouth daily.  09/25/18  Yes [name], [name], MD  carvedilol (COREG) 12.5 MG tablet Take 1 tablet (12.5 mg total) by mouth 2 (two) times daily with a meal. Patient taking differently: Take 12.5 mg by mouth daily.  03/15/18  Yes Kilroy, Luke K, PA-C  diclofenac (VOLTAREN) 75 MG EC tablet TAKE 1 TABLET BY MOUTH TWICE DAILY WITH MEALS 09/24/18  Yes Sanjuana Kava, MD  diclofenac sodium (VOLTAREN) 1 %  GEL Apply 4 g topically 4 (four) times daily. Patient taking differently: Apply 4 g topically 4 (four) times daily as needed (joint pain).  05/02/18  Yes Sanjuana Kava, MD  DULoxetine (CYMBALTA) 60 MG capsule Take 1 capsule by mouth once daily 09/25/18  Yes [name], [name], MD  furosemide (LASIX) 20 MG tablet Take 1 tablet (20 mg total) by mouth daily. Patient taking differently: Take 20 mg by mouth daily as needed for fluid.  03/15/18  Yes Kilroy, Doreene Burke, PA-C  HYDROcodone-acetaminophen (NORCO/VICODIN) 5-325 MG tablet One tablet by mouth every six hours as needed for pain. Must last 30 days 09/20/18  Yes Sanjuana Kava, MD  insulin lispro (HUMALOG) 100 UNIT/ML injection Inject 1-40 Units into the skin continuous. Max bolus dose is 40 units Via pump   Yes [provider]  levothyroxine (SYNTHROID) 137 MCG tablet Take 137 mcg by mouth daily  before breakfast.   Yes [provider]  lisinopril (PRINIVIL,ZESTRIL) 40 MG tablet Take 1 tablet (40 mg total) by mouth daily. 06/15/18  Yes [name], [name], MD  nitroGLYCERIN (NITROSTAT) 0.4 MG SL tablet Place 1 tablet (0.4 mg total) under the tongue every 5 (five) minutes x 3 doses as needed for chest pain. 03/15/18  Yes Kilroy, Luke K, PA-C  sitaGLIPtin (JANUVIA) 100 MG tablet Take 100 mg by mouth daily.    Yes [provider]    Scheduled Meds: . amLODipine  10 mg Oral Daily  . aspirin EC  162 mg Oral Daily  . atorvastatin  80 mg Oral q morning - 10a  . buPROPion  150 mg Oral BID  . carvedilol  12.5 mg Oral q morning - 10a  . diclofenac  75 mg Oral BID WC  . DULoxetine  60 mg Oral Daily  . enoxaparin (LOVENOX) injection  40 mg Subcutaneous Q24H  . insulin aspart  0-9 Units Subcutaneous TID WC  . levothyroxine  137 mcg Oral Q0600  . lisinopril  40 mg Oral Daily   Continuous Infusions: PRN Meds:.acetaminophen **OR** acetaminophen, meclizine, nitroGLYCERIN, ondansetron **OR** ondansetron (ZOFRAN) IV     Results for orders placed or performed during the hospital encounter of 10/07/18 (from the past 48 hour(s))  CBG monitoring, ED     Status: Abnormal   Collection Time: 10/07/18 11:52 PM  Result Value Ref Range   Glucose-Capillary 186 (H) 70 - 99 mg/dL  Lactic acid, plasma     Status: None   Collection Time: 10/08/18 12:44 AM  Result Value Ref Range   Lactic Acid, Venous 0.7 0.5 - 1.9 mmol/L    Comment: Performed at [facility], [address], [place], [place] [address]  CBC with Differential/Platelet     Status: None   Collection Time: 10/08/18 12:45 AM  Result Value Ref Range   WBC 7.9 4.0 - 10.5 K/uL   RBC 4.67 3.87 - 5.11 MIL/uL   Hemoglobin 12.8 12.0 - 15.0 g/dL   HCT 40.6 36.0 - 46.0 %   MCV 86.9 80.0 - 100.0 fL   MCH 27.4 26.0 - 34.0 pg   MCHC 31.5 30.0 - 36.0 g/dL   RDW 14.1 11.5 - 15.5 %   Platelets 211 150 - 400 K/uL   nRBC 0.0 0.0 - 0.2 %    Neutrophils Relative % 86 %   Neutro Abs 6.8 1.7 - 7.7 K/uL   Lymphocytes Relative 9 %   Lymphs Abs 0.7 0.7 - 4.0 K/uL   Monocytes Relative 4 %   Monocytes  Absolute 0.3 0.1 - 1.0 K/uL   Eosinophils Relative 0 %   Eosinophils Absolute 0.0 0.0 - 0.5 K/uL   Basophils Relative 1 %   Basophils Absolute 0.0 0.0 - 0.1 K/uL   Immature Granulocytes 0 %   Abs Immature Granulocytes 0.03 0.00 - 0.07 K/uL    Comment: Performed at [facility], [name] [place], [place] [address]  Comprehensive metabolic panel     Status: Abnormal   Collection Time: 10/08/18 12:45 AM  Result Value Ref Range   Sodium 141 135 - 145 mmol/L   Potassium 4.4 3.5 - 5.1 mmol/L   Chloride 108 98 - 111 mmol/L   CO2 26 22 - 32 mmol/L   Glucose, Bld 214 (H) 70 - 99 mg/dL   BUN 25 (H) 8 - 23 mg/dL   Creatinine, Ser 0.84 0.44 - 1.00 mg/dL   Calcium 8.4 (L) 8.9 - 10.3 mg/dL   Total Protein 5.9 (L) 6.5 - 8.1 g/dL   Albumin 3.1 (L) 3.5 - 5.0 g/dL   AST 17 15 - 41 U/L   ALT 18 0 - 44 U/L   Alkaline Phosphatase 74 38 - 126 U/L   Total Bilirubin 0.5 0.3 - 1.2 mg/dL   GFR calc non Af Amer >60 >60 mL/min   GFR calc Af Amer >60 >60 mL/min   Anion gap 7 5 - 15    Comment: Performed at [facility], [address], [place], [place] [address]  Lipase, blood     Status: None   Collection Time: 10/08/18 12:45 AM  Result Value Ref Range   Lipase 21 11 - 51 U/L    Comment: Performed at [facility], [address] [place], [place] [address]  Troponin I - ONCE - STAT     Status: None   Collection Time: 10/08/18 12:45 AM  Result Value Ref Range   Troponin I <0.03 <0.03 ng/mL    Comment: Performed at [facility], [address], [place], [place] [address]  Glucose, capillary     Status: Abnormal   Collection Time: 10/08/18  5:38 AM  Result Value Ref Range   Glucose-Capillary 157 (H) 70 - 99 mg/dL  Comprehensive metabolic panel     Status: Abnormal   Collection Time: 10/08/18  7:06 AM  Result Value Ref Range   Sodium 140 135  - 145 mmol/L   Potassium 3.7 3.5 - 5.1 mmol/L   Chloride 106 98 - 111 mmol/L   CO2 26 22 - 32 mmol/L   Glucose, Bld 187 (H) 70 - 99 mg/dL   BUN 20 8 - 23 mg/dL   Creatinine, Ser 0.78 0.44 - 1.00 mg/dL   Calcium 8.4 (L) 8.9 - 10.3 mg/dL   Total Protein 5.6 (L) 6.5 - 8.1 g/dL   Albumin 2.9 (L) 3.5 - 5.0 g/dL   AST 16 15 - 41 U/L   ALT 17 0 - 44 U/L   Alkaline Phosphatase 69 38 - 126 U/L   Total Bilirubin 0.4 0.3 - 1.2 mg/dL   GFR calc non Af Amer >60 >60 mL/min   GFR calc Af Amer >60 >60 mL/min   Anion gap 8 5 - 15    Comment: Performed at [facility], [address], [place],  [address]  Troponin I - Now Then Q6H     Status: None   Collection Time: 10/08/18  7:06 AM  Result Value Ref Range   Troponin I <0.03 <0.03 ng/mL    Comment: Performed  at [facility], [address], [place], [place] [address]  Troponin I - Now Then Q6H     Status: None   Collection Time: 10/08/18 12:52 PM  Result Value Ref Range   Troponin I <0.03 <0.03 ng/mL    Comment: Performed at [facility], [address], [place], [place] [address]  Glucose, capillary     Status: Abnormal   Collection Time: 10/08/18  4:56 PM  Result Value Ref Range   Glucose-Capillary 161 (H) 70 - 99 mg/dL   Comment 1 Notify RN    Comment 2 Document in Chart   Glucose, capillary     Status: Abnormal   Collection Time: 10/08/18 10:42 PM  Result Value Ref Range   Glucose-Capillary 123 (H) 70 - 99 mg/dL  Hemoglobin A1c     Status: Abnormal   Collection Time: 10/09/18  5:58 AM  Result Value Ref Range   Hgb A1c MFr Bld 6.4 (H) 4.8 - 5.6 %    Comment: (NOTE) Pre diabetes:          5.7%-6.4% Diabetes:              >6.4% Glycemic control for   <7.0% adults with diabetes    Mean Plasma Glucose 136.98 mg/dL    Comment: Performed at [facility], [place] [address], [place], [place] [address]  Basic metabolic panel     Status: Abnormal   Collection Time: 10/09/18  5:58 AM  Result Value Ref Range   Sodium 141 135 - 145  mmol/L   Potassium 3.5 3.5 - 5.1 mmol/L   Chloride 107 98 - 111 mmol/L   CO2 26 22 - 32 mmol/L   Glucose, Bld 129 (H) 70 - 99 mg/dL   BUN 18 8 - 23 mg/dL   Creatinine, Ser 0.83 0.44 - 1.00 mg/dL   Calcium 8.6 (L) 8.9 - 10.3 mg/dL   GFR calc non Af Amer >60 >60 mL/min   GFR calc Af Amer >60 >60 mL/min   Anion gap 8 5 - 15    Comment: Performed at [facility], [address] [place], [place] [address]  Glucose, capillary     Status: Abnormal   Collection Time: 10/09/18 11:12 AM  Result Value Ref Range   Glucose-Capillary 151 (H) 70 - 99 mg/dL  Glucose, capillary     Status: Abnormal   Collection Time: 10/09/18  4:08 PM  Result Value Ref Range   Glucose-Capillary 129 (H) 70 - 99 mg/dL    Studies/Results:  CAROTID  IMPRESSION: 1. No significant atherosclerotic plaque or evidence of stenosis in the right internal carotid artery. 2. Mild (1-49%) stenosis proximal left internal carotid artery secondary to trace smooth heterogeneous atherosclerotic plaque. 3. Vertebral arteries are patent with normal antegrade flow.    BRAIN MRI: FINDINGS: Brain: There is a background pattern of extensive chronic small-vessel ischemic change throughout the pons. There are old small vessel infarctions affecting the left cerebellum, both thalami, both basal ganglia, and throughout the cerebral hemispheric white matter. There is a 12 mm in diameter ring lesion showing peripheral restricted diffusion located within the periventricular deep white matter just superior to body of the left lateral ventricle, within the lateral corpus callosum. The differential diagnosis for ring lesions on DWI is broad. In this patient with a history of multiple old infarctions, acute to subacute infarction can present with this appearance. However, the differential diagnosis does include atypical infections in person's with immunocompromise including aspergillosis and pyogenic brain abscess.  Lymphoma can have this  appearance as well. In immuno competent patient's, demyelinating disease/multiple sclerosis/Balo's concentric sclerosis, ADEM, PML and brain tumor could also be considered. Contrast administration would be somewhat useful in a valuation.  Vascular: Major vessels at the base of the brain show flow.  Skull and upper cervical spine: Negative  Sinuses/Orbits: Clear/normal  Other: None  IMPRESSION: 1.2 cm in maximal diameter ring DWI restricted lesion in the lateral corpus callosum on the left superior to the body of the left lateral ventricle. Background pattern of extensive chronic ischemic changes throughout the brain. The primary differential diagnosis in this case is that of cerebral infarction versus demyelinating disease. Many other diagnoses are also included in the differential diagnosis, depending upon the immunologic competency status of the patient as discussed above. I think would be useful to perform post gadolinium imaging if possible.   CTA  FINDINGS: CTA NECK FINDINGS  Aortic arch: Visualized aortic arch of normal caliber with normal 3 vessel morphology. Mild scattered atheromatous plaque about the aortic arch and origin of the great vessels without hemodynamically significant stenosis. Visualized subclavian arteries widely patent.  Right carotid system: Right common carotid artery patent from its origin to the bifurcation without stenosis. Mild atheromatous plaque about the right bifurcation without hemodynamically significant stenosis. Right ICA mildly tortuous but widely patent to the skull base without stenosis, dissection or occlusion.  Left carotid system: Left common carotid artery patent from its origin to the bifurcation without stenosis. Minimal atheromatous plaque about the left bifurcation without hemodynamically significant stenosis. Left ICA mildly tortuous but widely patent to the skull base without stenosis, dissection or occlusion.   Vertebral arteries: Both of the vertebral arteries arise from the subclavian arteries. Vertebral arteries widely patent within the neck without stenosis, dissection, or occlusion.  Skeleton: No acute osseous abnormality. No discrete lytic or blastic osseous lesions. Degenerative geode formation noted at the dens. Gas lucency within the C4 vertebral body likely degenerative in nature. Patient largely edentulous.  Other neck: No other acute soft tissue abnormality within the neck. No adenopathy. Salivary glands normal. Subcentimeter hypodense right thyroid nodule noted, of doubtful significance.  Upper chest: Visualized upper esophagus mildly patulous with layering fluid within the esophageal lumen. Visualized upper chest demonstrates no other acute finding. Visualized lungs are largely clear.  Review of the MIP images confirms the above findings  CTA HEAD FINDINGS  Anterior circulation: Petrous segments widely patent bilaterally. Scattered calcified atheromatous plaque within the cavernous/supraclinoid ICAs with resultant mild to moderate narrowing (up to approximately 50%). ICA termini well perfused. A1 segments patent bilaterally. Normal anterior communicating artery. Anterior cerebral arteries patent to their distal aspects without stenosis. M1 segments patent bilaterally. Normal MCA bifurcations. Distal MCA branches well perfused and symmetric.  Posterior circulation: Vertebral arteries widely patent to the vertebrobasilar junction without stenosis. Posterior inferior cerebral arteries patent bilaterally. Basilar widely patent to its distal aspect. Superior cerebral arteries patent proximally. Both of the posterior cerebral arteries primarily supplied via the basilar. Both of the PCAs well perfused to their distal aspects without stenosis.  Venous sinuses: Patent.  Anatomic variants: None significant.  Delayed phase: Not performed.  Review of the MIP  images confirms the above findings  IMPRESSION: 1. Negative CTA for large vessel occlusion. No dissection or other acute vascular abnormality identified. 2. Atherosclerotic change within the carotid siphons with associated moderate multifocal narrowing. No other hemodynamically significant or correctable stenosis about the major arterial vasculature of the head and neck.  The brain MRI is reviewed in person.  On DWI there is increased ovoid signal seen in the middle of the corpus callosum on the left side.  This is not associated with reduced signal on the ADC scan.  However, no contrast enhancement is appreciated with repeat gadolinium study.  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  Changes are also noted in the pontine regions.  There is F encephalomalacia involving the left inferior cerebellum, right lentiform nuclei and the thalamic areas bilaterally consistent with remote infarct.  No hemorrhages appreciated.  CTA shows no occlusive disease intracranially or extracranially.    Cathlin Buchan A. Merlene Laughter, M.D.  Diplomate, Tax adviser of Psychiatry and Neurology ( Neurology). 10/09/2018, 4:14 PM

## 2018-10-10 ENCOUNTER — Inpatient Hospital Stay (HOSPITAL_COMMUNITY): Payer: 59

## 2018-10-10 DIAGNOSIS — I340 Nonrheumatic mitral (valve) insufficiency: Secondary | ICD-10-CM

## 2018-10-10 DIAGNOSIS — F4323 Adjustment disorder with mixed anxiety and depressed mood: Secondary | ICD-10-CM

## 2018-10-10 LAB — URINALYSIS, ROUTINE W REFLEX MICROSCOPIC
Bacteria, UA: NONE SEEN
Bilirubin Urine: NEGATIVE
Glucose, UA: 50 mg/dL — AB
Hgb urine dipstick: NEGATIVE
Ketones, ur: 5 mg/dL — AB
Leukocytes,Ua: NEGATIVE
Nitrite: NEGATIVE
Protein, ur: >=300 mg/dL — AB
Specific Gravity, Urine: 1.024 (ref 1.005–1.030)
pH: 6 (ref 5.0–8.0)

## 2018-10-10 LAB — LIPID PANEL
Cholesterol: 173 mg/dL (ref 0–200)
HDL: 37 mg/dL — ABNORMAL LOW (ref >40)
LDL Cholesterol: 108 mg/dL — ABNORMAL HIGH (ref 0–99)
Total CHOL/HDL Ratio: 4.7 RATIO
Triglycerides: 140 mg/dL (ref <150)
VLDL: 28 mg/dL (ref 0–40)

## 2018-10-10 LAB — GLUCOSE, CAPILLARY
Glucose-Capillary: 140 mg/dL — ABNORMAL HIGH (ref 70–99)
Glucose-Capillary: 168 mg/dL — ABNORMAL HIGH (ref 70–99)
Glucose-Capillary: 151 mg/dL — ABNORMAL HIGH (ref 70–99)

## 2018-10-10 LAB — ECHOCARDIOGRAM COMPLETE BUBBLE STUDY
Height: 67 in
Weight: 3596.8 oz

## 2018-10-10 MED ORDER — ASPIRIN 81 MG PO TBEC: 162.0000 mg | DELAYED_RELEASE_TABLET | Freq: Every day | ORAL | Status: AC

## 2018-10-10 MED ORDER — MECLIZINE HCL 25 MG PO TABS: 25.0000 mg | ORAL_TABLET | Freq: Three times a day (TID) | ORAL | 0 refills | Status: DC | PRN

## 2018-10-10 MED ORDER — CLOPIDOGREL BISULFATE 75 MG PO TABS: 75.0000 mg | ORAL_TABLET | Freq: Every day | ORAL | 4 refills | Status: DC

## 2018-10-10 NOTE — Discharge Summary (Signed)
Physician Discharge Summary  Jennifer [name] WIO:[id number] DOB: 1956/01/03 DOA: 10/07/2018  PCP: Abner Greenspan, MD  Admit date: 10/07/2018 Discharge date: 10/10/2018  Time spent: 35 minutes  Recommendations for Outpatient Follow-up:  1. Repeat CBC to follow hemoglobin trend as the patient has been discharged on dual antiplatelet therapy. 2. -Repeat basic metabolic panel to follow electrolytes and renal function.   Discharge Diagnoses:  Active Problems:   Diabetic peripheral neuropathy (HCC)   Hyperlipidemia LDL goal <100   Adjustment disorder with mixed anxiety and depressed mood   Essential hypertension   COPD (chronic obstructive pulmonary disease) (HCC)   Hypothyroidism   CAD S/P percutaneous coronary angioplasty   Cerebrovascular accident (CVA) ([place])   Acute vertigo with vomiting and inability to stand   Type 2 diabetes mellitus ([place])   Tobacco use   Discharge Condition: Stable and improved.  Patient discharged home with home health services.  Follow-up with PCP in 10 days and follow-up with neurology in 6 weeks.  Diet recommendation: Heart healthy and modified carbohydrate diet.  Filed Weights   10/07/18 2341 10/08/18 0544  Weight: 104.3 kg 102 kg    History of present illness:  63 year old female who presented with dizziness, nausea and vomiting. She does have significant past medical history for coronary arterydisease, dyslipidemia,hypertension, type 2 diabetes mellitus, hypothyroidism, andanxiety/depression. Reported acute onset of vertigo, nausea and emesis, after waking up. On her initial physical examination she was afebrile, pulse rate 85, respiratory rate 20, blood pressure 222/84, oxygen saturation 94%. She was awake and alert, moist mucous membranes, lungs clear to auscultation bilaterally, heart S1-S2 present and rhythmic, abdomen soft nontender, 1+ lower extremity edema, neurologically patient was nonfocal. Sodium 141, potassium 4.4, chloride 108, bicarb  26, glucose 214, creatinine 0.84, white count 7.9, hemoglobin 12.8, hematocrit 40.6, platelets 211.Her chest radiograph was negative for infiltrates, chronic increased lung markings bilaterally.Head CT with no acute changes, remote right basal ganglia lacunar infarct with a small remote left cerebellar infarct. HeadCT angiography with negative for large vessel occlusion. CT of the abdomen with prominent perinephric fat stranding bilaterally.EKG with 81 bpm, normal axis, normal intervals, J-point elevation V2 to V3, no T wave abnormalities, positive PVC.  Patient was admittedtothe hospital working diagnosis of acute onset of vertigo  Hospital Course:  1-/acute CVA -MRI with abnormal lesion in the lateral corpus callosum. -Carotic ultrasound demonstrated 1-49% plaque buildup without significant stenosis. -2D echo without acute thrombi -A1c 6.4 -LDL 108 -Patient discharge on dual antiplatelet therapy for 2 months with subsequent usage of Plavix only. -Appreciate neurology recommendations and assistance. -Patient will follow-up with Dr. Merlene Laughter in about 6 weeks after discharge -Continue treatment for her cholesterol, diabetes and blood pressure -Patient encouraged to follow heart healthy/modified carbohydrate diet. -Physical therapy recommended a skilled nursing facility as part of rehabilitation; patient declined and instead decided to proceed with home health services.  2-type 2 diabetes mellitus -A1c 6.4 -Resume home hypoglycemic regimen -Continue modified carbohydrate diet  3-hypothyroidism -Continue Synthroid -TSH stable.  4-hyperlipidemia -LDL 108 -Continue Lipitor  5-essential hypertension -Stable and demonstrating adequate control -Continue lisinopril, amlodipine and carvedilol. -Heart healthy diet has been encouraged.  6-history of COPD -No wheezing, normal respiratory effort -Continue as needed rescue inhalers.  7-history of CVA in the past -Continue as  mentioned above dual antiplatelet therapy for 2 months and subsequently use of Plavix.  8-depression and/anxiety -Continue bupropion and duloxetine.  9-obesity -class 2 -Body mass index is 35.21 kg/m. -Low calorie diet, portion control and increase physical  activity discussed with patient.   Procedures:  See below for x-ray reports  2D echo: Normal ejection fraction, global left ventricular hypokinesis; no wall motion abnormalities.  Impaired diastolic relaxation function.  Consultations:  Neurology  Discharge Exam: Vitals:   10/10/18 0522 10/10/18 1346  BP: (!) 165/68 (!) 154/64  Pulse: 81 73  Resp: 20 18  Temp:  98.1 F (36.7 C)  SpO2: 94% 95%    General: Afebrile, still reporting intermittent episode of dizziness sensation (especially when changing position), no nausea, no vomiting, tolerating diet without any problems. Cardiovascular: S1 and S2, no rubs, no gallops, no JVD on exam. Respiratory: Good air movement bilaterally, no wheezing, normal respiratory effort. Abdomen: Soft, nontender, positive bowel sounds Extremities: No cyanosis, no clubbing. Neurologic exam: No focal deficit appreciated, normal speech, patient expressing dizziness sensation when changing positions and intermittent blurred vision.  Gait unsteady; no pronation drift.  Discharge Instructions   Discharge Instructions    Diet - low sodium heart healthy   Complete by:  As directed    Increase activity slowly   Complete by:  As directed      Allergies as of 10/10/2018   No Known Allergies     Medication List    STOP taking these medications   diclofenac 75 MG EC tablet Commonly known as:  VOLTAREN     TAKE these medications   amLODipine 10 MG tablet Commonly known as:  NORVASC Take 1 tablet (10 mg total) by mouth daily for 30 days.   aspirin 81 MG EC tablet Take 2 tablets (162 mg total) by mouth daily.   atorvastatin 80 MG tablet Commonly known as:  LIPITOR Take 1 tablet (80  mg total) by mouth daily at 6 PM. What changed:  when to take this   buPROPion 150 MG 12 hr tablet Commonly known as:  WELLBUTRIN SR Take 1 tablet by mouth twice daily What changed:  when to take this   carvedilol 12.5 MG tablet Commonly known as:  COREG Take 1 tablet (12.5 mg total) by mouth 2 (two) times daily with a meal. What changed:  when to take this   clopidogrel 75 MG tablet Commonly known as:  PLAVIX Take 1 tablet (75 mg total) by mouth daily with breakfast. Start taking on:  October 11, 2018   diclofenac sodium 1 % Gel Commonly known as:  VOLTAREN Apply 4 g topically 4 (four) times daily. What changed:    when to take this  reasons to take this   DULoxetine 60 MG capsule Commonly known as:  CYMBALTA Take 1 capsule by mouth once daily   furosemide 20 MG tablet Commonly known as:  LASIX Take 1 tablet (20 mg total) by mouth daily. What changed:    when to take this  reasons to take this   HumaLOG 100 UNIT/ML injection Generic drug:  insulin lispro Inject 1-40 Units into the skin continuous. Max bolus dose is 40 units Via pump   HYDROcodone-acetaminophen 5-325 MG tablet Commonly known as:  NORCO/VICODIN One tablet by mouth every six hours as needed for pain. Must last 30 days   levothyroxine 137 MCG tablet Commonly known as:  SYNTHROID Take 137 mcg by mouth daily before breakfast.   lisinopril 40 MG tablet Commonly known as:  ZESTRIL Take 1 tablet (40 mg total) by mouth daily.   meclizine 25 MG tablet Commonly known as:  ANTIVERT Take 1 tablet (25 mg total) by mouth 3 (three) times daily as needed for dizziness.  nitroGLYCERIN 0.4 MG SL tablet Commonly known as:  NITROSTAT Place 1 tablet (0.4 mg total) under the tongue every 5 (five) minutes x 3 doses as needed for chest pain.   sitaGLIPtin 100 MG tablet Commonly known as:  JANUVIA Take 100 mg by mouth daily.            Durable Medical Equipment  (From admission, onward)          Start     Ordered   10/09/18 1010  For home use only DME Shower stool  Once     10/09/18 1009         No Known Allergies Follow-up Information    [name], [name], MD. Schedule an appointment as soon as possible for a visit in 10 day(s).   Specialties:  Family Medicine, Radiology Contact information: [place] [address] [phone]        Herminio Commons, MD .   Specialty:  Cardiology Contact information: [place] [address] [phone]        Phillips Odor, MD. Schedule an appointment as soon as possible for a visit in 6 week(s).   Specialty:  Neurology Contact information: [address] [place] [address] [phone]           The results of significant diagnostics from this hospitalization (including imaging, microbiology, ancillary and laboratory) are listed below for reference.    Significant Diagnostic Studies: Ct Angio Head W Or Wo Contrast  Result Date: 10/08/2018 CLINICAL DATA:  Initial evaluation for persistent vertigo. EXAM: CT ANGIOGRAPHY HEAD AND NECK TECHNIQUE: Multidetector CT imaging of the head and neck was performed using the standard protocol during bolus administration of intravenous contrast. Multiplanar CT image reconstructions and MIPs were obtained to evaluate the vascular anatomy. Carotid stenosis measurements (when applicable) are obtained utilizing NASCET criteria, using the distal internal carotid diameter as the denominator. CONTRAST:  Seventy-five cc of Omnipaque 350. COMPARISON:  Comparison made with prior head CT from earlier the same day. FINDINGS: CTA NECK FINDINGS Aortic arch: Visualized aortic arch of normal caliber with normal 3 vessel morphology. Mild scattered atheromatous plaque about the aortic arch and origin of the great vessels without hemodynamically significant stenosis. Visualized subclavian arteries widely patent. Right carotid system: Right common carotid artery patent  from its origin to the bifurcation without stenosis. Mild atheromatous plaque about the right bifurcation without hemodynamically significant stenosis. Right ICA mildly tortuous but widely patent to the skull base without stenosis, dissection or occlusion. Left carotid system: Left common carotid artery patent from its origin to the bifurcation without stenosis. Minimal atheromatous plaque about the left bifurcation without hemodynamically significant stenosis. Left ICA mildly tortuous but widely patent to the skull base without stenosis, dissection or occlusion. Vertebral arteries: Both of the vertebral arteries arise from the subclavian arteries. Vertebral arteries widely patent within the neck without stenosis, dissection, or occlusion. Skeleton: No acute osseous abnormality. No discrete lytic or blastic osseous lesions. Degenerative geode formation noted at the dens. Gas lucency within the C4 vertebral body likely degenerative in nature. Patient largely edentulous. Other neck: No other acute soft tissue abnormality within the neck. No adenopathy. Salivary glands normal. Subcentimeter hypodense right thyroid nodule noted, of doubtful significance. Upper chest: Visualized upper esophagus mildly patulous with layering fluid within the esophageal lumen. Visualized upper chest demonstrates no other acute finding. Visualized lungs are largely clear. Review of the MIP images confirms the above findings CTA HEAD FINDINGS Anterior circulation:  Petrous segments widely patent bilaterally. Scattered calcified atheromatous plaque within the cavernous/supraclinoid ICAs with resultant mild to moderate narrowing (up to approximately 50%). ICA termini well perfused. A1 segments patent bilaterally. Normal anterior communicating artery. Anterior cerebral arteries patent to their distal aspects without stenosis. M1 segments patent bilaterally. Normal MCA bifurcations. Distal MCA branches well perfused and symmetric. Posterior  circulation: Vertebral arteries widely patent to the vertebrobasilar junction without stenosis. Posterior inferior cerebral arteries patent bilaterally. Basilar widely patent to its distal aspect. Superior cerebral arteries patent proximally. Both of the posterior cerebral arteries primarily supplied via the basilar. Both of the PCAs well perfused to their distal aspects without stenosis. Venous sinuses: Patent. Anatomic variants: None significant. Delayed phase: Not performed. Review of the MIP images confirms the above findings IMPRESSION: 1. Negative CTA for large vessel occlusion. No dissection or other acute vascular abnormality identified. 2. Atherosclerotic change within the carotid siphons with associated moderate multifocal narrowing. No other hemodynamically significant or correctable stenosis about the major arterial vasculature of the head and neck. Electronically Signed   By: Jeannine Boga M.D.   On: 10/08/2018 03:49   Ct Head Wo Contrast  Result Date: 10/08/2018 CLINICAL DATA:  Initial evaluation for acute altered mental status. EXAM: CT HEAD WITHOUT CONTRAST TECHNIQUE: Contiguous axial images were obtained from the base of the skull through the vertex without intravenous contrast. COMPARISON:  Prior CT from 06/16/2018. FINDINGS: Brain: Generalized age-related cerebral atrophy with moderate chronic small vessel ischemic disease. Remote lacunar infarct present at the right basal ganglia. Small remote left cerebellar infarct. No acute intracranial hemorrhage. No acute large vessel territory infarct. No mass lesion, midline shift or mass effect. No hydrocephalus. No extra-axial fluid collection. Vascular: No hyperdense vessel. Scattered vascular calcifications noted within the carotid siphons. Skull: Scalp soft tissues within normal limits.  Calvarium intact. Sinuses/Orbits: Globes and orbital soft tissues demonstrate no acute finding. Tiny osteoma noted at the right ethmoidal air cells.  Paranasal sinuses are clear. No mastoid effusion. Other: None. IMPRESSION: 1. No acute intracranial abnormality. 2. Remote right basal ganglia lacunar infarct, with additional small remote left cerebellar infarct. 3. Underlying age-related cerebral atrophy with moderate chronic small vessel ischemic disease. Electronically Signed   By: Jeannine Boga M.D.   On: 10/08/2018 00:38   Ct Angio Neck W And/or Wo Contrast  Result Date: 10/08/2018 CLINICAL DATA:  Initial evaluation for persistent vertigo. EXAM: CT ANGIOGRAPHY HEAD AND NECK TECHNIQUE: Multidetector CT imaging of the head and neck was performed using the standard protocol during bolus administration of intravenous contrast. Multiplanar CT image reconstructions and MIPs were obtained to evaluate the vascular anatomy. Carotid stenosis measurements (when applicable) are obtained utilizing NASCET criteria, using the distal internal carotid diameter as the denominator. CONTRAST:  Seventy-five cc of Omnipaque 350. COMPARISON:  Comparison made with prior head CT from earlier the same day. FINDINGS: CTA NECK FINDINGS Aortic arch: Visualized aortic arch of normal caliber with normal 3 vessel morphology. Mild scattered atheromatous plaque about the aortic arch and origin of the great vessels without hemodynamically significant stenosis. Visualized subclavian arteries widely patent. Right carotid system: Right common carotid artery patent from its origin to the bifurcation without stenosis. Mild atheromatous plaque about the right bifurcation without hemodynamically significant stenosis. Right ICA mildly tortuous but widely patent to the skull base without stenosis, dissection or occlusion. Left carotid system: Left common carotid artery patent from its origin to the bifurcation without stenosis. Minimal atheromatous plaque about the left bifurcation without hemodynamically significant stenosis. Left  ICA mildly tortuous but widely patent to the skull base  without stenosis, dissection or occlusion. Vertebral arteries: Both of the vertebral arteries arise from the subclavian arteries. Vertebral arteries widely patent within the neck without stenosis, dissection, or occlusion. Skeleton: No acute osseous abnormality. No discrete lytic or blastic osseous lesions. Degenerative geode formation noted at the dens. Gas lucency within the C4 vertebral body likely degenerative in nature. Patient largely edentulous. Other neck: No other acute soft tissue abnormality within the neck. No adenopathy. Salivary glands normal. Subcentimeter hypodense right thyroid nodule noted, of doubtful significance. Upper chest: Visualized upper esophagus mildly patulous with layering fluid within the esophageal lumen. Visualized upper chest demonstrates no other acute finding. Visualized lungs are largely clear. Review of the MIP images confirms the above findings CTA HEAD FINDINGS Anterior circulation: Petrous segments widely patent bilaterally. Scattered calcified atheromatous plaque within the cavernous/supraclinoid ICAs with resultant mild to moderate narrowing (up to approximately 50%). ICA termini well perfused. A1 segments patent bilaterally. Normal anterior communicating artery. Anterior cerebral arteries patent to their distal aspects without stenosis. M1 segments patent bilaterally. Normal MCA bifurcations. Distal MCA branches well perfused and symmetric. Posterior circulation: Vertebral arteries widely patent to the vertebrobasilar junction without stenosis. Posterior inferior cerebral arteries patent bilaterally. Basilar widely patent to its distal aspect. Superior cerebral arteries patent proximally. Both of the posterior cerebral arteries primarily supplied via the basilar. Both of the PCAs well perfused to their distal aspects without stenosis. Venous sinuses: Patent. Anatomic variants: None significant. Delayed phase: Not performed. Review of the MIP images confirms the above  findings IMPRESSION: 1. Negative CTA for large vessel occlusion. No dissection or other acute vascular abnormality identified. 2. Atherosclerotic change within the carotid siphons with associated moderate multifocal narrowing. No other hemodynamically significant or correctable stenosis about the major arterial vasculature of the head and neck. Electronically Signed   By: Jeannine Boga M.D.   On: 10/08/2018 03:49   Mr Brain Wo Contrast  Result Date: 10/08/2018 CLINICAL DATA:  Headache and vertigo, 2 days duration EXAM: MRI HEAD WITHOUT CONTRAST TECHNIQUE: Multiplanar, multiecho pulse sequences of the brain and surrounding structures were obtained without intravenous contrast. COMPARISON:  Head CT and CTA same day.  MRI 11/14/2017 FINDINGS: Brain: There is a background pattern of extensive chronic small-vessel ischemic change throughout the pons. There are old small vessel infarctions affecting the left cerebellum, both thalami, both basal ganglia, and throughout the cerebral hemispheric white matter. There is a 12 mm in diameter ring lesion showing peripheral restricted diffusion located within the periventricular deep white matter just superior to body of the left lateral ventricle, within the lateral corpus callosum. The differential diagnosis for ring lesions on DWI is broad. In this patient with a history of multiple old infarctions, acute to subacute infarction can present with this appearance. However, the differential diagnosis does include atypical infections in person's with immunocompromise including aspergillosis and pyogenic brain abscess. Lymphoma can have this appearance as well. In immuno competent patient's, demyelinating disease/multiple sclerosis/Balo's concentric sclerosis, ADEM, PML and brain tumor could also be considered. Contrast administration would be somewhat useful in a valuation. Vascular: Major vessels at the base of the brain show flow. 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 Background pattern of extensive chronic ischemic changes throughout the brain. The primary differential diagnosis in this  case is that of cerebral infarction versus demyelinating disease. Many other diagnoses are also included in the differential diagnosis, depending upon the immunologic competency status of the patient as discussed above. I think would be useful to perform post gadolinium imaging if possible. Electronically Signed   By: Nelson Chimes M.D.   On: 10/08/2018 13:06   Mr Brain W Contrast  Result Date: 10/09/2018 CLINICAL DATA:  BILATERAL weakness for 2 days. No known injury. Stroke risk factors include type 2 diabetes, hypertension, coronary artery disease and hypercholesterolemia EXAM: MRI HEAD WITH CONTRAST TECHNIQUE: Multiplanar, multiecho pulse sequences of the brain and surrounding structures were obtained with intravenous contrast. CONTRAST:  Gadavist 10 mL. COMPARISON:  Noncontrast brain MR 10/08/2018 FINDINGS: Brain: Post infusion, there is no enhancement of the abnormal ovoid lesion demonstrated on MR performed yesterday, having ring like restricted diffusion, T2 and FLAIR hyperintensity, central hypointensity on a background of extensive prior lacunar infarcts and small vessel disease. The lesion involves the LEFT body of corpus callosum. Elsewhere in the brain, no abnormal enhancement is observed as well. Vascular: Normal flow voids. Skull and upper cervical spine: Normal marrow signal. Sinuses/Orbits: Negative. Other: None. IMPRESSION: No abnormal enhancement of the LEFT corpus callosum lesion with ring like restricted diffusion. Findings are consistent with a cerebral infarction, probably in the subacute phase. Electronically Signed   By: Staci Righter M.D.   On: 10/09/2018 10:00    Ct Abdomen Pelvis W Contrast  Result Date: 10/08/2018 CLINICAL DATA:  Initial evaluation for acute nausea, vomiting. EXAM: CT ABDOMEN AND PELVIS WITH CONTRAST TECHNIQUE: Multidetector CT imaging of the abdomen and pelvis was performed using the standard protocol following bolus administration of intravenous contrast. CONTRAST:  15mL OMNIPAQUE IOHEXOL 300 MG/ML  SOLN COMPARISON:  Prior radiograph from earlier the same day. FINDINGS: Lower chest: Mild scattered subsegmental atelectatic changes seen dependently within the lung bases bilaterally. Visualized lungs are otherwise clear. Hepatobiliary: Liver demonstrates a normal contrast enhanced appearance. Gallbladder surgically absent. No biliary dilatation. Pancreas: Pancreas within normal limits. Spleen: Ill-defined 12 mm hypodensity noted within the spleen, indeterminate, but of doubtful clinical significance. Spleen otherwise unremarkable. Adrenals/Urinary Tract: Adrenal glands within normal limits. Kidneys equal in size with symmetric enhancement. Evaluation for nephrolithiasis limited due to timing of the contrast bolus, with the majority of the contrast in the renal collecting systems. No hydronephrosis or hydroureter. Prominent perinephric fat stranding seen bilaterally, indeterminate. Bladder moderately distended without acute finding. Stomach/Bowel: Small hiatal hernia noted. Stomach decompressed without acute finding. No evidence for bowel obstruction. Normal appendix. Colonic diverticulosis without evidence for acute diverticulitis. No acute inflammatory changes seen about the bowels. Vascular/Lymphatic: Moderate aortic atherosclerosis. No aneurysm. Mesenteric vessels patent proximally. No pathologically enlarged intra-abdominopelvic lymph nodes. Reproductive: Uterus is absent.  Ovaries not confidently identified. Other: No free air or fluid. Musculoskeletal: No acute osseous finding. No discrete lytic or blastic osseous lesions. IMPRESSION: 1.  Prominent perinephric fat stranding about the kidneys bilaterally. Findings are nonspecific, and could reflect sequelae of acute infection/pyelonephritis. Sequelae of acute or prior intrinsic renal injury could also be considered. Correlation with urinalysis recommended. No hydronephrosis or obstructive uropathy. 2. Colonic diverticulosis without evidence for acute diverticulitis. 3. Moderate aortic atherosclerosis. Electronically Signed   By: Jeannine Boga M.D.   On: 10/08/2018 03:48   US Carotid Bilateral  Result Date: 10/09/2018 CLINICAL DATA:  63 year old female with symptoms of transient ischemic attack. EXAM: BILATERAL CAROTID DUPLEX ULTRASOUND TECHNIQUE: Pearline Cables scale imaging, color Doppler and duplex ultrasound were performed of bilateral carotid  and vertebral arteries in the neck. COMPARISON:  CTA neck performed yesterday, 10/08/2018 FINDINGS: Criteria: Quantification of carotid stenosis is based on velocity parameters that correlate the residual internal carotid diameter with NASCET-based stenosis levels, using the diameter of the distal internal carotid lumen as the denominator for stenosis measurement. The following velocity measurements were obtained: RIGHT ICA: 109/28 cm/sec CCA: 25/63 cm/sec SYSTOLIC ICA/CCA RATIO:  1.4 ECA:  154 cm/sec LEFT ICA: 115/22 cm/sec CCA: 89/37 cm/sec SYSTOLIC ICA/CCA RATIO:  1.4 ECA:  67 cm/sec RIGHT CAROTID ARTERY: No significant atherosclerotic plaque or evidence of stenosis. RIGHT VERTEBRAL ARTERY:  Patent with normal antegrade flow. LEFT CAROTID ARTERY: Trace heterogeneous atherosclerotic plaque in the proximal internal carotid artery. By peak systolic velocity criteria, the estimated stenosis remains less than 50%. LEFT VERTEBRAL ARTERY:  Patent with normal antegrade flow. IMPRESSION: 1. No significant atherosclerotic plaque or evidence of stenosis in the right internal carotid artery. 2. Mild (1-49%) stenosis proximal left internal carotid artery secondary to  trace smooth heterogeneous atherosclerotic plaque. 3. Vertebral arteries are patent with normal antegrade flow. Signed, Criselda Peaches, MD, [facility] Radiology Electronically Signed   By: Jacqulynn Cadet M.D.   On: 10/09/2018 10:05   Dg Abdomen Acute W/chest  Result Date: 10/08/2018 CLINICAL DATA:  63 year old female with dizziness, generalized weakness. EXAM: DG ABDOMEN ACUTE W/ 1V CHEST COMPARISON:  Portable chest 11/03/2015. Lumbar radiographs 01/19/2017. FINDINGS: Upright AP view of the chest. Lung volumes and mediastinal contours are within normal limits. Visualized tracheal air column is within normal limits. No pneumothorax or pneumoperitoneum. No pulmonary edema, pleural effusion or acute pulmonary opacity identified. Upright and supine views of the abdomen and pelvis. Non obstructed bowel gas pattern. Stable cholecystectomy clips. Abdominal and pelvic visceral contours are within normal limits. No acute osseous abnormality identified. IMPRESSION: 1. Normal bowel gas pattern, no free air. 2. No acute cardiopulmonary abnormality. Electronically Signed   By: Genevie Ann M.D.   On: 10/08/2018 00:55   Labs: Basic Metabolic Panel: Recent Labs  Lab 10/08/18 0045 10/08/18 0706 10/09/18 0558  NA 141 140 141  K 4.4 3.7 3.5  CL 108 106 107  CO2 26 26 26   GLUCOSE 214* 187* 129*  BUN 25* 20 18  CREATININE 0.84 0.78 0.83  CALCIUM 8.4* 8.4* 8.6*   Liver Function Tests: Recent Labs  Lab 10/08/18 0045 10/08/18 0706  AST 17 16  ALT 18 17  ALKPHOS 74 69  BILITOT 0.5 0.4  PROT 5.9* 5.6*  ALBUMIN 3.1* 2.9*   Recent Labs  Lab 10/08/18 0045  LIPASE 21   CBC: Recent Labs  Lab 10/08/18 0045  WBC 7.9  NEUTROABS 6.8  HGB 12.8  HCT 40.6  MCV 86.9  PLT 211   Cardiac Enzymes: Recent Labs  Lab 10/08/18 0045 10/08/18 0706 10/08/18 1252  TROPONINI <0.03 <0.03 <0.03    CBG: Recent Labs  Lab 10/09/18 1608 10/09/18 2202  10/10/18 0726 10/10/18 1121 10/10/18 1608  GLUCAP 129* 125* 140* 168* 151*    Signed:  Barton Dubois MD.  [facility] 10/10/2018, 5:17 PM

## 2018-10-10 NOTE — Discharge Instructions (Signed)
Take medications as prescribed Make sure to keep yourself well-hydrated and follow heart healthy diet Increase activity slowly Aspirin and Plavix for 70-month; after that Plavix on daily basis. Follow-up with PCP in 10 days Follow-up with neurology in 6 weeks.

## 2018-10-10 NOTE — Progress Notes (Signed)
Physical Therapy Treatment Patient Details Name: Jennifer [name] MRN: [id number] DOB: 1956/04/02 Today's Date: 10/10/2018    History of Present Illness Jennifer [name] is a 63 y.o. female with medical history significant of CAD, NSTEMI, hyperlipidemia, hypertension, type 2 diabetes, diabetic peripheral neuropathy, hypothyroidism, anxiety,/depression, tobacco use who is coming to the emergency department due to nausea, multiple episodes of emesis, a spinning sensation shortly after she woke up around noontime today.  She states that her symptoms got worse with spinning.  The patient states that she took all her medications on Saturday, went to work in the evening and returned home to sleep without any symptoms.  She denies headache, slurred speech, vision changes, but states she has had a lot of trouble with her gait, balance and ambulation.  She denies fever, chills, sore throat, wheezing, dyspnea, hemoptysis, chest pain, palpitations, diaphoresis, PND, orthopnea, but states she occasionally gets lower extremity edema.  She denies abdominal pain, diarrhea, constipation, melena, hematochezia, dysuria, frequency or hematuria.  She denies dysuria, frequency or hematuria.  She was given 4 mg of Zofran by EMS without significant improvement.    PT Comments    Patient demonstrates increased endurance/distance for gait training in hallway with less c/o dizziness and nausea.  Patient has to lean on nearby objects for support, decreased arm swing, very unsteady and tends to lean to the left when not using an AD, safer using RW and on room air with SpO2 between 93-95% during gait training - RN notified.  Patient tolerated sitting up in chair after therapy to eat breakfast.  Patient will benefit from continued physical therapy in hospital and recommended venue below to increase strength, balance, endurance for safe ADLs and gait.   Follow Up Recommendations  SNF;Supervision - Intermittent;Supervision for  mobility/OOB     Equipment Recommendations  None recommended by PT    Recommendations for Other Services       Precautions / Restrictions Precautions Precautions: Fall Restrictions Weight Bearing Restrictions: No    Mobility  Bed Mobility Overal bed mobility: Modified Independent             General bed mobility comments: slightly increased time, use of bedrail  Transfers Overall transfer level: Needs assistance Equipment used: Rolling walker (2 wheeled);None Transfers: Sit to/from [employer] to Stand: Min guard Stand pivot transfers: Min guard       General transfer comment: unsteady with tendency to lean on nearby objects without AD, safer using RW  Ambulation/Gait Ambulation/Gait assistance: Min guard;Min assist Gait Distance (Feet): 100 Feet Assistive device: Rolling walker (2 wheeled) Gait Pattern/deviations: Decreased step length - right;Decreased step length - left;Decreased stride length Gait velocity: decreased   General Gait Details: very unsteady labored cadence with veering to the left and near loss of balance without AD, improved endurance/distance using RW without loss of balance, limited secondary to fatigue   Stairs             Wheelchair Mobility    Modified Rankin (Stroke Patients Only)       Balance Overall balance assessment: Needs assistance Sitting-balance support: Feet supported;No upper extremity supported Sitting balance-Leahy Scale: Good     Standing balance support: During functional activity;No upper extremity supported Standing balance-Leahy Scale: Poor Standing balance comment: fair/poor without AD, fair using RW                            Cognition Arousal/Alertness: Awake/alert Behavior During Therapy:  WFL for tasks assessed/performed Overall Cognitive Status: Within Functional Limits for tasks assessed                                        Exercises  General Exercises - Lower Extremity Long Arc Quad: Seated;Strengthening;AROM;Both;10 reps Hip Flexion/Marching: Seated;Strengthening;AROM;Both;10 reps Toe Raises: Seated;Strengthening;AROM;Both;10 reps Heel Raises: Seated;AROM;Strengthening;Both;10 reps    General Comments        Pertinent Vitals/Pain Pain Assessment: No/denies pain    Home Living                      Prior Function            PT Goals (current goals can now be found in the care plan section) Acute Rehab PT Goals Patient Stated Goal: return home after rehab PT Goal Formulation: With patient Time For Goal Achievement: 10/22/18 Potential to Achieve Goals: Good Progress towards PT goals: Progressing toward goals    Frequency    7X/week      PT Plan Current plan remains appropriate    Co-evaluation              AM-PAC PT "6 Clicks" Mobility   Outcome Measure  Help needed turning from your back to your side while in a flat bed without using bedrails?: None Help needed moving from lying on your back to sitting on the side of a flat bed without using bedrails?: None Help needed moving to and from a bed to a chair (including a wheelchair)?: A Little Help needed standing up from a chair using your arms (e.g., wheelchair or bedside chair)?: A Little Help needed to walk in hospital room?: A Little Help needed climbing 3-5 steps with a railing? : A Lot 6 Click Score: 19    End of Session Equipment Utilized During Treatment: Gait belt Activity Tolerance: Patient tolerated treatment well Patient left: in chair;with call bell/phone within reach;with chair alarm set;with nursing/sitter in room Nurse Communication: Mobility status PT Visit Diagnosis: Unsteadiness on feet (R26.81);Other abnormalities of gait and mobility (R26.89);Muscle weakness (generalized) (M62.81)     Time: 7505-1833 PT Time Calculation (min) (ACUTE ONLY): 26 min  Charges:  $Gait Training: 8-22 mins $Therapeutic  Exercise: 8-22 mins                     8:41 AM, 10/10/18 Lonell Grandchild, MPT Physical Therapist with [facility] [phone] office [phone] mobile phone

## 2018-10-10 NOTE — Progress Notes (Signed)
*  PRELIMINARY RESULTS* Echocardiogram 2D Echocardiogram has been performed with saline bubble study.  Samuel Germany 10/10/2018, 1:47 PM

## 2018-10-11 ENCOUNTER — Ambulatory Visit (INDEPENDENT_AMBULATORY_CARE_PROVIDER_SITE_OTHER): Payer: 59 | Admitting: Family Medicine

## 2018-10-11 ENCOUNTER — Encounter: Payer: Self-pay | Admitting: Family Medicine

## 2018-10-11 VITALS — BP 180/98 | HR 86 | Temp 98.0°F | Ht 67.0 in | Wt 225.4 lb

## 2018-10-11 DIAGNOSIS — R42 Dizziness and giddiness: Secondary | ICD-10-CM

## 2018-10-11 DIAGNOSIS — E11319 Type 2 diabetes mellitus with unspecified diabetic retinopathy without macular edema: Secondary | ICD-10-CM

## 2018-10-11 DIAGNOSIS — F17200 Nicotine dependence, unspecified, uncomplicated: Secondary | ICD-10-CM

## 2018-10-11 DIAGNOSIS — I10 Essential (primary) hypertension: Secondary | ICD-10-CM | POA: Diagnosis not present

## 2018-10-11 DIAGNOSIS — I639 Cerebral infarction, unspecified: Secondary | ICD-10-CM

## 2018-10-11 DIAGNOSIS — R1110 Vomiting, unspecified: Secondary | ICD-10-CM

## 2018-10-11 DIAGNOSIS — I6529 Occlusion and stenosis of unspecified carotid artery: Secondary | ICD-10-CM

## 2018-10-11 DIAGNOSIS — E1142 Type 2 diabetes mellitus with diabetic polyneuropathy: Secondary | ICD-10-CM | POA: Diagnosis not present

## 2018-10-11 DIAGNOSIS — Z794 Long term (current) use of insulin: Secondary | ICD-10-CM

## 2018-10-11 DIAGNOSIS — J449 Chronic obstructive pulmonary disease, unspecified: Secondary | ICD-10-CM | POA: Diagnosis not present

## 2018-10-11 DIAGNOSIS — E785 Hyperlipidemia, unspecified: Secondary | ICD-10-CM | POA: Diagnosis not present

## 2018-10-11 LAB — URINE CULTURE: Culture: <10000 — AB

## 2018-10-11 NOTE — Progress Notes (Signed)
Virtual Visit via Video Note  I connected with Jennifer [name] on 10/11/18 at 12:15 PM EDT by a video enabled telemedicine application and verified that I am speaking with the correct person using two identifiers.  Location: Patient: is at home  Provider: in my office at [place]    I discussed the limitations of evaluation and management by telemedicine and the availability of in person appointments. The patient expressed understanding and agreed to proceed.  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 -Carotic ultrasound demonstrated 1-49% plaque buildup without significant stenosis. -2D echo without acute thrombi -A1c 6.4 -LDL 108 -Patient discharge on dual antiplatelet therapy for 2 months with subsequent usage of Plavix only. -Appreciate neurology recommendations and assistance. -Patient will follow-up with Dr. Merlene Laughter in about 6 weeks after discharge -Continue treatment for her cholesterol, diabetes and blood pressure -Patient encouraged to follow heart healthy/modified carbohydrate diet. -Physical therapy recommended a skilled nursing facility as part of rehabilitation; patient declined and instead decided to proceed with home health services.  PT is supposed to come today / she feels she leans to the L with gait /walker A little dizzy when she changes position     2-type 2 diabetes mellitus -A1c 6.4 -Resume home hypoglycemic regimen -Continue modified carbohydrate diet  No changes at home [place] well   3-hypothyroidism -Continue Synthroid -TSH stable.  No changes in her clinical status /but is fatigued from recent  hosp  4-hyperlipidemia -LDL 108 -Continue Lipitor Goal is lower but is on max dose Will d/w cardiology  5-essential hypertension -Stable and demonstrating adequate control -Continue lisinopril, amlodipine and carvedilol. -Heart healthy diet has been encouraged.  BP Readings from Last 3 Encounters:  10/11/18 (!) 175/105  10/10/18 (!) 154/64  08/15/18 (!) 195/105  has not taken medicines yet today  Per pt her bp had improved before her CVA (since her last visit)  Her diclofenac was held    6-history of COPD -No wheezing, normal respiratory effort -Continue as needed rescue inhalers. Smoking status - has not smoked since Sunday/when this happened She is ready to quit  Husband still smokes (has cig in the house unfortunately)  Not sob at all  No cough   7-history of CVA in the past -Continue as mentioned above dual antiplatelet therapy for 2 months and subsequently use of Plavix.  8-depression and/anxiety -Continue bupropion and duloxetine.  9-obesity -class 2 -Body mass index is 35.21 kg/m. -Low calorie diet, portion control and increase physical activity discussed with patient.   Procedures:  See below for x-ray reports  2D echo: Normal ejection fraction, global left ventricular hypokinesis; no wall motion abnormalities.  Impaired diastolic relaxation function.  Consultations: neuro   Last labs Lab Results  Component Value Date   CREATININE 0.83 10/09/2018   BUN 18 10/09/2018   NA 141 10/09/2018   K 3.5 10/09/2018   CL 107 10/09/2018   CO2 26 10/09/2018   Lab Results  Component Value Date   ALT 17 10/08/2018   AST 16 10/08/2018   ALKPHOS 69 10/08/2018   BILITOT 0.4 10/08/2018    Lab Results  Component Value Date   WBC 7.9 10/08/2018  HGB 12.8 10/08/2018   HCT 40.6 10/08/2018   MCV 86.9 10/08/2018   PLT 211 10/08/2018   Lab Results  Component Value Date   CHOL 173 10/10/2018   HDL 37 (L) 10/10/2018   LDLCALC 108 (H) 10/10/2018    LDLDIRECT 154.2 11/04/2011   TRIG 140 10/10/2018   CHOLHDL 4.7 10/10/2018   Lab Results  Component Value Date   HGBA1C 6.4 (H) 10/09/2018   MRI report MR BRAIN W CONTRAST (Accession [id number]) (Order [id number])  Imaging  Date: 10/09/2018 Department: [facility] Released By/Authorizing: Tawni Millers, MD (auto-released)  Exam Information   Status Exam Begun  Exam Ended   Final [99] 10/09/2018 9:18 AM 10/09/2018 9:48 AM  PACS Images   Show images for MR BRAIN W CONTRAST  Study Result   CLINICAL DATA:  BILATERAL weakness for 2 days. No known injury. Stroke risk factors include type 2 diabetes, hypertension, coronary artery disease and hypercholesterolemia  EXAM: MRI HEAD WITH CONTRAST  TECHNIQUE: Multiplanar, multiecho pulse sequences of the brain and surrounding structures were obtained with intravenous contrast.  CONTRAST:  Gadavist 10 mL.  COMPARISON:  Noncontrast brain MR 10/08/2018  FINDINGS: Brain: Post infusion, there is no enhancement of the abnormal ovoid lesion demonstrated on MR performed yesterday, having ring like restricted diffusion, T2 and FLAIR hyperintensity, central hypointensity on a background of extensive prior lacunar infarcts and small vessel disease. The lesion involves the LEFT body of corpus callosum. Elsewhere in the brain, no abnormal enhancement is observed as well.  Vascular: Normal flow voids.  Skull and upper cervical spine: Normal marrow signal.  Sinuses/Orbits: Negative.  Other: None.  IMPRESSION: No abnormal enhancement of the LEFT corpus callosum lesion with ring like restricted diffusion. Findings are consistent with a cerebral infarction, probably in the subacute phase.   Electronically Signed   By: Staci Righter M.D.   On: 10/09/2018 10:00   CT of abd/pelvis (done for n/v) IMPRESSION: 1. Prominent perinephric fat stranding about the kidneys bilaterally. Findings are  nonspecific, and could reflect sequelae of acute infection/pyelonephritis. Sequelae of acute or prior intrinsic renal injury could also be considered. Correlation with urinalysis recommended. No hydronephrosis or obstructive uropathy. 2. Colonic diverticulosis without evidence for acute diverticulitis. 3. Moderate aortic atherosclerosis.  ua showed  glucose and protein ,no infection /neg culture    Review of Systems  Constitutional: Positive for malaise/fatigue. Negative for chills, diaphoresis, fever and weight loss.  Eyes: Negative for blurred vision.  Respiratory: Negative for cough, shortness of breath and wheezing.   Cardiovascular: Negative for chest pain, palpitations and claudication.  Gastrointestinal: Negative for diarrhea, heartburn and vomiting.       Nausea is improved  Musculoskeletal: Negative for myalgias.  Skin: Negative for rash.  Neurological: Positive for dizziness and weakness. Negative for tingling, sensory change, seizures, loss of consciousness and headaches.       Dizziness is greatly improved Poor balance  ? If a little weaker on the L -wants to lean that way with walker      Patient Active Problem List   Diagnosis Date Noted  . Acute vertigo with vomiting and inability to stand 10/08/2018  . Type 2 diabetes mellitus ([place]) 10/08/2018  . Tobacco use 10/08/2018  . Carotid artery disease ([place]) 03/15/2018  . Cerebrovascular accident (CVA) ([place]) 11/16/2017  . Low back pain 10/26/2017  . Screening mammogram, encounter for 09/18/2017  . CAD S/P percutaneous coronary angioplasty 11/04/2015  . NSTEMI (non-ST elevated myocardial infarction) ([place]) 11/03/2015  .  Hypothyroidism 07/06/2010  . Hyperlipidemia LDL goal <100 08/28/2008  . Essential hypertension 11/13/2007  . 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 Diabetic peripheral neuropathy ([place]) 09/21/2006  .  Obesity (BMI 30-39.9) 09/21/2006  . TOBACCO ABUSE 09/21/2006  . Adjustment disorder with mixed anxiety and depressed mood 09/21/2006  . CARPAL TUNNEL SYNDROME 09/21/2006  . COPD (chronic obstructive pulmonary disease) ([place]) 09/21/2006  . EDEMA 09/21/2006  . MIGRAINES, HX OF 09/21/2006   Past Medical History:  Diagnosis Date  . CAD (coronary artery disease)    cath 5/23 100% dist RCA lesion treated with 2 overlapping Integrity Resolute DES ( 2.25x59mm, 2.25x67mm), 60% mid RCA treated medically, 99% OM2 not amenable to PCI, 70% D1 lesion, EF normal  . Hypercholesteremia   . Hypertension   . Thyroid disease   . Type 2 diabetes mellitus ([place]) 10/08/2018   Past Surgical History:  Procedure Laterality Date  . ABDOMINAL HYSTERECTOMY    . CARDIAC CATHETERIZATION N/A 11/03/2015   Procedure: Left Heart Cath and Coronary Angiography;  Surgeon: Leonie Man, MD;  Location: [facility];  Service: Cardiovascular;  Laterality: N/A;  . CARDIAC CATHETERIZATION N/A 11/03/2015   Procedure: Coronary Stent Intervention;  Surgeon: Leonie Man, MD;  Location: [facility];  Service: Cardiovascular;  Laterality: N/A;  . CARPAL TUNNEL RELEASE     Social History   Tobacco Use  . Smoking status: Former Smoker    Packs/day: 0.25    Types: Cigarettes    Last attempt to quit: 10/07/2018    Years since quitting: 0.0  . Smokeless tobacco: Never Used  Substance Use Topics  . Alcohol use: No    Alcohol/week: 0.0 standard drinks  . Drug use: No   Family History  Problem Relation Age of Onset  . Heart disease Mother   . Stroke Sister   . Heart attack Brother    No Known Allergies Current Outpatient Medications on File Prior to Visit  Medication Sig Dispense Refill  . amLODipine (NORVASC) 10 MG tablet Take 1 tablet (10 mg total) by mouth daily for 30 days. 30 tablet 5  . aspirin 81 MG EC tablet Take 2 tablets (162 mg total) by mouth daily.    Marland Kitchen atorvastatin (LIPITOR) 80 MG tablet Take 1  tablet (80 mg total) by mouth daily at 6 PM. (Patient taking differently: Take 80 mg by mouth every morning. ) 90 tablet 2  . buPROPion (WELLBUTRIN SR) 150 MG 12 hr tablet Take 1 tablet by mouth twice daily (Patient taking differently: Take 150 mg by mouth daily. ) 180 tablet 0  . carvedilol (COREG) 12.5 MG tablet Take 1 tablet (12.5 mg total) by mouth 2 (two) times daily with a meal. (Patient taking differently: Take 12.5 mg by mouth daily. ) 90 tablet 2  . clopidogrel (PLAVIX) 75 MG tablet Take 1 tablet (75 mg total) by mouth daily with breakfast. 30 tablet 4  . DULoxetine (CYMBALTA) 60 MG capsule Take 1 capsule by mouth once daily 90 capsule 30  . furosemide (LASIX) 20 MG tablet Take 1 tablet (20 mg total) by mouth daily. (Patient taking differently: Take 20 mg by mouth daily as needed for fluid. ) 90 tablet 2  . HYDROcodone-acetaminophen (NORCO/VICODIN) 5-325 MG tablet One tablet by mouth every six hours as needed for pain. Must last 30 days 60 tablet 0  . insulin lispro (HUMALOG) 100 UNIT/ML  injection Inject 1-40 Units into the skin continuous. Max bolus dose is 40 units Via pump    . levothyroxine (SYNTHROID) 137 MCG tablet Take 137 mcg by mouth daily before breakfast.    . lisinopril (PRINIVIL,ZESTRIL) 40 MG tablet Take 1 tablet (40 mg total) by mouth daily. 90 tablet 3  . meclizine (ANTIVERT) 25 MG tablet Take 1 tablet (25 mg total) by mouth 3 (three) times daily as needed for dizziness. 30 tablet 0  . nitroGLYCERIN (NITROSTAT) 0.4 MG SL tablet Place 1 tablet (0.4 mg total) under the tongue every 5 (five) minutes x 3 doses as needed for chest pain. 25 tablet 3  . sitaGLIPtin (JANUVIA) 100 MG tablet Take 100 mg by mouth daily.      No current facility-administered medications on file prior to visit.     Observations/Objective: Patient appears well with no obv wt gain or loss  She is fatigued with mildly flat affect  Pleasant and mentally sharp/answers questions appropriately  No facial  swelling or asymmetry  No rash/pallor or skin changes  No obvious tremor  Speech is not slurred /not hoarse No sob or wheezing with speech  She does ambulate with walker  Daughter asked a few questions to camera as well    Assessment and Plan: Problem List Items Addressed This Visit      Cardiovascular and Mediastinum   Essential hypertension (Chronic)    bp elevated today -pt has not taken medicine yet (enc compliance)  BP: (!) 180/98 recent CVA Reviewed hospital records, lab results and studies in detail     Asked pt/family to call us tomorrow or Monday with readings after med compliance  Due for cardiology f/u -will check on that also      Cerebrovascular accident (CVA) ([place]) - Primary    Causing dizziness/n/v Reviewed hospital records, lab results and studies in detail   Lesion seen on MR in corpus callosum Carotids and echo reassuring  HTN and lipid control is goal  Dual antiplatelet (asa/plavix) for 2 mo followed by plavix alone For neuro f/u 6 wk  PT and NSG at home  Some gait issues/using walker  Enc her to NOT smoke and take medicine as directed       Carotid artery disease ([place])     Endocrine   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, with long-term current use of insulin ([place])    Lab Results  Component Value Date   HGBA1C 6.4 (H) 10/09/2018           Other   TOBACCO ABUSE (Chronic)    Quit since admit to hosp this week  Not craving now  Husband smokes outside  [place] ready to quit  Recent CVA  (Reviewed hospital records, lab results and studies in detail ) Enc cessation strongly       Hyperlipidemia LDL goal <100    LDL 108 in hospital  CV dz and CVA Continues max dose lipitor        Acute vertigo with vomiting and inability to stand    Thought to be due to cva Reviewed hospital records, lab results and studies in detail            Follow Up Instructions: Let us know if neurology (Dr  Merlene Laughter) does not call you to set up a 6 week appointment No work until you are recovered and much stronger Please continue NOT to smoke  Please call us with some blood  pressure readings Friday or Monday when you have taken your medicine Call the cardiology office to see when you are due for a visit there  Good luck with your therapy at home Your home nurse can call me if anything further is needed  Stay off the diclofenac pill    I discussed the assessment and treatment plan with the patient. The patient was provided an opportunity to ask questions and all were answered. The patient agreed with the plan and demonstrated an understanding of the instructions.   The patient was advised to call back or seek an in-person evaluation if the symptoms worsen or if the condition fails to improve as anticipated.     Loura Pardon, MD

## 2018-10-11 NOTE — Patient Instructions (Signed)
Let us know if neurology (Dr Merlene Laughter) does not call you to set up a 6 week appointment No work until you are recovered and much stronger Please continue NOT to smoke  Please call us with some blood pressure readings Friday or Monday when you have taken your medicine Call the cardiology office to see when you are due for a visit there  Good luck with your therapy at home Your home nurse can call me if anything further is needed  Stay off the diclofenac pill

## 2018-10-11 NOTE — Assessment & Plan Note (Signed)
LDL 108 in hospital  CV dz and CVA Continues max dose lipitor

## 2018-10-11 NOTE — Assessment & Plan Note (Signed)
Quit since admit to hosp this week  Not craving now  Husband smokes outside  [place] ready to quit  Recent CVA  (Reviewed hospital records, lab results and studies in detail ) Enc cessation strongly

## 2018-10-11 NOTE — TOC Transition Note (Signed)
Transition of Care [facility]) - CM/SW Discharge Note   Patient Details  Name: Jennifer [name] MRN: [id number] Date of Birth: 1956/05/20  Transition of Care [facility]) CM/SW Contact:  Sherald Barge, RN Phone Number: 10/11/2018, 10:13 AM   Clinical Narrative:   DC home on 10/09/28. [facility] rep aware of DC.     Patient Goals and CMS Choice Patient states their goals for this hospitalization and ongoing recovery are:: return home with hsuband CMS Medicare.gov Compare Post Acute Care list provided to:: Patient Choice offered to / list presented to : Patient Discharge Plan and Services     Post Acute Care Choice: Home Health, Durable Medical Equipment          DME Arranged: Shower stool DME Agency: [employer] Date DME Agency Contacted: 10/09/18 Time DME Agency Contacted: [id number] Representative spoke with at DME Agency: [place]: Refused SNF [facility] Agency: [place] ([place]) Date [place]: 10/09/18 Time [place]: 8264 Representative spoke with at [place]: Romualdo Bolk   Readmission Risk Interventions Readmission Risk Prevention Plan 10/09/2018  Medication Screening Complete  Transportation Screening Complete  Some recent data might be hidden

## 2018-10-11 NOTE — Assessment & Plan Note (Signed)
Lab Results  Component Value Date   HGBA1C 6.4 (H) 10/09/2018

## 2018-10-11 NOTE — Assessment & Plan Note (Signed)
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

## 2018-10-11 NOTE — Assessment & Plan Note (Signed)
Thought to be due to cva Reviewed hospital records, lab results and studies in detail

## 2018-10-11 NOTE — Assessment & Plan Note (Addendum)
bp elevated today -pt has not taken medicine yet (enc compliance)  BP: (!) 180/98 recent CVA Reviewed hospital records, lab results and studies in detail     Asked pt/family to call us tomorrow or Monday with readings after med compliance  Due for cardiology f/u -will check on that also

## 2018-10-12 ENCOUNTER — Telehealth: Payer: Self-pay | Admitting: Family Medicine

## 2018-10-12 ENCOUNTER — Telehealth: Payer: Self-pay

## 2018-10-12 DIAGNOSIS — I639 Cerebral infarction, unspecified: Secondary | ICD-10-CM

## 2018-10-12 DIAGNOSIS — E1142 Type 2 diabetes mellitus with diabetic polyneuropathy: Secondary | ICD-10-CM | POA: Diagnosis not present

## 2018-10-12 DIAGNOSIS — J449 Chronic obstructive pulmonary disease, unspecified: Secondary | ICD-10-CM | POA: Diagnosis not present

## 2018-10-12 DIAGNOSIS — R42 Dizziness and giddiness: Secondary | ICD-10-CM | POA: Diagnosis not present

## 2018-10-12 HISTORY — DX: Cerebral infarction, unspecified: I63.9

## 2018-10-12 NOTE — Telephone Encounter (Signed)
[facility] @ [facility] called wanting to get verbal orders for  Home health PT For balance /grating training/ safety ed and fall prevention /establish home exercise program  1 time this week  today 2 time next week 2 times the following week  Best number [phone]

## 2018-10-12 NOTE — Telephone Encounter (Signed)
Sent to PCP for the PK for verbal orders

## 2018-10-12 NOTE — Telephone Encounter (Signed)
[place] and gave the OK for verbal orders per PCP

## 2018-10-12 NOTE — Telephone Encounter (Signed)
Please ok those verbal orders  

## 2018-10-12 NOTE — Telephone Encounter (Signed)
Called and left a detailed VM for the [facility] for verbal orders per PCP Dr. Alba Cory left pt's name and DOB my name and call back number if needed.

## 2018-10-12 NOTE — Telephone Encounter (Signed)
Jennifer [name] with [facility] left v/m that pt was recently discharged from hospital with CVA, vomiting and acute vertigo. Jennifer [name] request verbal orders for [facility] nursing 1 x a wk for 3 wks.Please advise.

## 2018-10-15 ENCOUNTER — Telehealth: Payer: Self-pay | Admitting: *Deleted

## 2018-10-15 ENCOUNTER — Telehealth: Payer: Self-pay | Admitting: Radiology

## 2018-10-15 NOTE — Telephone Encounter (Signed)
Please ok that verbal order  

## 2018-10-15 NOTE — Telephone Encounter (Signed)
Jennifer [name] with [facility] left a voicemail stating that this is the second request for start of care orders for patient. Verdis Frederickson stated that patient was discharged from hospital because of a CVA. Verdis Frederickson is requesting nursing once a week for three weeks

## 2018-10-15 NOTE — Telephone Encounter (Signed)
Patient scheduled for TKR, I have been calling her to let her know she needs to call Hoyle Sauer for preop  When I reviewed chart I see she has just been started on Plavix  Hospitalized from 4/26 to 4.29/20 for dizziness and n/v thought to be due to CVA  Please review/ advsie

## 2018-10-15 NOTE — Telephone Encounter (Signed)
-----   Message from Josue Hector sent at 10/15/2018 12:27 PM EDT ----- Marykay Lex,  I have not been able to reach this patient to reschedule her PAT.  I have LM a few times and today it says the call can not be completed after ringing several time.  Do you have any way of letting her know to call me?  Thanks,  Hoyle Sauer

## 2018-10-15 NOTE — Telephone Encounter (Signed)
Detailed message left on 10/12/18 for Jennifer [name] on her VM with same orders, verbal okay given to move forward with nursing home care per frequency requested.  Message left again today validating same orders for patient.   Informed Verdis Frederickson to please call us back if she is needing anything additional.

## 2018-10-16 ENCOUNTER — Telehealth: Payer: Self-pay | Admitting: *Deleted

## 2018-10-16 DIAGNOSIS — R42 Dizziness and giddiness: Secondary | ICD-10-CM | POA: Diagnosis not present

## 2018-10-16 DIAGNOSIS — J449 Chronic obstructive pulmonary disease, unspecified: Secondary | ICD-10-CM | POA: Diagnosis not present

## 2018-10-16 DIAGNOSIS — E1142 Type 2 diabetes mellitus with diabetic polyneuropathy: Secondary | ICD-10-CM | POA: Diagnosis not present

## 2018-10-16 NOTE — Telephone Encounter (Signed)
dont schedule any totals   I ll explain later   She will need to wait 6 months for a cva

## 2018-10-16 NOTE — Telephone Encounter (Signed)
Please ok those verbal orders  

## 2018-10-16 NOTE — Telephone Encounter (Signed)
I have called yesterday and I am waiting for her to call me back, her surgery is scheduled in June, we will wait 6 months.  There are no totals scheduled until June

## 2018-10-16 NOTE — Telephone Encounter (Signed)
Jennifer [name] with [facility] OT called requesting verbal orders for 2 times a week for two weeks for OT, afor ASL transfer, home safety, fall prevention, upper extremity strengthen  [name] stated that this is a secure voicemail, so order can be left on it.

## 2018-10-17 DIAGNOSIS — E7800 Pure hypercholesterolemia, unspecified: Secondary | ICD-10-CM | POA: Diagnosis not present

## 2018-10-17 DIAGNOSIS — E039 Hypothyroidism, unspecified: Secondary | ICD-10-CM | POA: Diagnosis not present

## 2018-10-17 DIAGNOSIS — R42 Dizziness and giddiness: Secondary | ICD-10-CM | POA: Diagnosis not present

## 2018-10-17 DIAGNOSIS — I10 Essential (primary) hypertension: Secondary | ICD-10-CM | POA: Diagnosis not present

## 2018-10-17 DIAGNOSIS — E1142 Type 2 diabetes mellitus with diabetic polyneuropathy: Secondary | ICD-10-CM | POA: Diagnosis not present

## 2018-10-17 DIAGNOSIS — J449 Chronic obstructive pulmonary disease, unspecified: Secondary | ICD-10-CM | POA: Diagnosis not present

## 2018-10-17 NOTE — Telephone Encounter (Signed)
Gave the approval of the verbal orders. 

## 2018-10-18 DIAGNOSIS — J449 Chronic obstructive pulmonary disease, unspecified: Secondary | ICD-10-CM | POA: Diagnosis not present

## 2018-10-18 DIAGNOSIS — E1142 Type 2 diabetes mellitus with diabetic polyneuropathy: Secondary | ICD-10-CM | POA: Diagnosis not present

## 2018-10-18 DIAGNOSIS — R42 Dizziness and giddiness: Secondary | ICD-10-CM | POA: Diagnosis not present

## 2018-10-19 DIAGNOSIS — E1142 Type 2 diabetes mellitus with diabetic polyneuropathy: Secondary | ICD-10-CM | POA: Diagnosis not present

## 2018-10-19 DIAGNOSIS — R42 Dizziness and giddiness: Secondary | ICD-10-CM | POA: Diagnosis not present

## 2018-10-19 DIAGNOSIS — J449 Chronic obstructive pulmonary disease, unspecified: Secondary | ICD-10-CM | POA: Diagnosis not present

## 2018-10-23 ENCOUNTER — Encounter: Payer: Self-pay | Admitting: Radiology

## 2018-10-23 DIAGNOSIS — J449 Chronic obstructive pulmonary disease, unspecified: Secondary | ICD-10-CM | POA: Diagnosis not present

## 2018-10-23 DIAGNOSIS — E1142 Type 2 diabetes mellitus with diabetic polyneuropathy: Secondary | ICD-10-CM | POA: Diagnosis not present

## 2018-10-23 DIAGNOSIS — R42 Dizziness and giddiness: Secondary | ICD-10-CM | POA: Diagnosis not present

## 2018-10-23 NOTE — Telephone Encounter (Signed)
I have sent her a letter, unable to reach multiple attempts.

## 2018-10-24 ENCOUNTER — Telehealth: Payer: Self-pay

## 2018-10-24 NOTE — Telephone Encounter (Signed)
Shannon left v/m (do not see DPR) pt was recently discharged from hospital for CVA. Pt had note from hospital that pt could return to work on 10/31/18. [name] has spoken with PT and OT and they do not believe pts balance will be OK for pt to return to work on 10/31/18. [name] request note for work to excuse pt from work 10/31/18 - 11/27/18. Pt is scheduled for knee surgery on 11/27/18 and ortho will take care of note for work from 11/27/18.Shannon left fax # for pts employer [phone]. [name] request cb. Does a virtual appt need to be scheduled prior to work note?

## 2018-10-24 NOTE — Telephone Encounter (Signed)
Per Leafy Ro, RN since there is no DPR we can't talk to pt's daughter at all.   Leafy Ro, RN also said we can't fax a work note unless pt has signed a release to give [place] permission to send it. Called pt to see if she wants to come and sign a release so we can fax her work note to her job or does pt just want to pick up the letter and send it to her employer herself, but when I called pt's # back there was no answer so left VM requesting pt to call us back  Will keep letter in my inbox until I hear back from pt

## 2018-10-24 NOTE — Telephone Encounter (Signed)
Letter is printed and in IN box Thanks

## 2018-10-24 NOTE — Telephone Encounter (Signed)
Pt called back she will pick up letter and also sign release to give [place] permission to speak with daughter in the future. Letter and released placed at the front for pick up

## 2018-10-25 ENCOUNTER — Telehealth: Payer: Self-pay | Admitting: Family Medicine

## 2018-10-25 DIAGNOSIS — I639 Cerebral infarction, unspecified: Secondary | ICD-10-CM

## 2018-10-25 NOTE — Telephone Encounter (Signed)
Spoke with patient and Referral sent to [facility], patient is aware.

## 2018-10-25 NOTE — Telephone Encounter (Signed)
Pt's daughter came by the office to pick up paperwork. She states pt is requesting a referral to [facility].

## 2018-10-26 ENCOUNTER — Other Ambulatory Visit (HOSPITAL_COMMUNITY): Payer: Self-pay

## 2018-10-26 ENCOUNTER — Telehealth: Payer: Self-pay | Admitting: Orthopedic Surgery

## 2018-10-26 NOTE — Telephone Encounter (Signed)
She wants to ask about scheduling surgery.  She wants an updates.  Please call her when you have time  Thanks

## 2018-10-26 NOTE — Telephone Encounter (Signed)
I have been trying to call her to let her know we can not do surgery until about 6 months from her CVA, usually it it this long (or longer) before she can d/c the Plavix for surgery.

## 2018-10-30 ENCOUNTER — Inpatient Hospital Stay (HOSPITAL_COMMUNITY): Admission: RE | Admit: 2018-10-30 | Payer: 59 | Source: Home / Self Care | Admitting: Orthopedic Surgery

## 2018-10-30 ENCOUNTER — Encounter (HOSPITAL_COMMUNITY): Admission: RE | Payer: Self-pay | Source: Home / Self Care

## 2018-10-30 DIAGNOSIS — H4312 Vitreous hemorrhage, left eye: Secondary | ICD-10-CM | POA: Diagnosis not present

## 2018-10-30 DIAGNOSIS — E113512 Type 2 diabetes mellitus with proliferative diabetic retinopathy with macular edema, left eye: Secondary | ICD-10-CM | POA: Diagnosis not present

## 2018-10-30 DIAGNOSIS — H35372 Puckering of macula, left eye: Secondary | ICD-10-CM | POA: Diagnosis not present

## 2018-10-30 SURGERY — ARTHROPLASTY, KNEE, TOTAL
Anesthesia: Choice | Laterality: Right

## 2018-10-31 ENCOUNTER — Other Ambulatory Visit: Payer: Self-pay

## 2018-10-31 ENCOUNTER — Ambulatory Visit (INDEPENDENT_AMBULATORY_CARE_PROVIDER_SITE_OTHER): Payer: 59 | Admitting: Neurology

## 2018-10-31 ENCOUNTER — Telehealth: Payer: Self-pay | Admitting: Radiology

## 2018-10-31 ENCOUNTER — Encounter: Payer: Self-pay | Admitting: Neurology

## 2018-10-31 VITALS — BP 160/86 | HR 74 | Temp 96.0°F | Ht 67.0 in | Wt 225.0 lb

## 2018-10-31 DIAGNOSIS — I6381 Other cerebral infarction due to occlusion or stenosis of small artery: Secondary | ICD-10-CM | POA: Diagnosis not present

## 2018-10-31 NOTE — Progress Notes (Signed)
[facility] [address] [place]. [place] [address] [phone]       OFFICE CONSULT NOTE  Jennifer [name] Date of Birth:  [birth year] Medical Record Number:  [id number]   Referring MD: Loura Pardon Reason for Referral: Stroke  HPI: Jennifer [name] is a 63 year old pleasant Caucasian lady seen today for initial office consultation visit for stroke.  History is obtained from the patient and review of electronic medical records and I have personally reviewed imaging films in PACS. Jennifer [name] is a 63 year old lady with past medical history hypertension, hyperlipidemia, diabetes with peripheral neuropathy, COPD, hypothyroidism, coronary artery disease status post angioplasty tobacco abuse who presented on 4/26/20220 to [facility] hospital with sudden onset of dizziness and nausea and leaning to the left side when she woke up.  She reported mild mild vertigo as well.  Her blood pressure was found to be significantly elevated on admission at 222 4.  He had a nonfocal exam but was slightly off balance.  CT scan of the head showed remote age right basal ganglia lacunar infarct and small left cerebellar lacunar.  CT angiogram showed no large vessel stenosis or occlusion.  MRI scan of the brain showed a large 1.2 cm left lateral corpus callosum.  Diffusion positive lesion likely a large lacunar infarct.  There is no enhancement on postcontrast images.  There is also remote age right deep white matter lacunar noted.  There are extensive changes of chronic small vessel disease involving the brainstem.  Transthoracic echo showed normal ejection fraction.  LDL cholesterol was elevated milligrams percent and hemoglobin A1c was 6.4 patient did well discharged home with no therapy needs.  She was placed on dual antiplatelet therapy tolerated it well without bleeding or bruising.  Started on Lipitor 80 mg as well and is tolerating it without this.  She states her blood pressure is under good control but  today in our office it is 160/86. she has started eating healthy.  She is also trying to quit smoking.  She gives prior history of right brain infarct about a year ago on 11/14/17 when she had left body numbness recovered fairly quickly.  The patient denies any clinical history suggestive of multiple sclerosis in the form of transient vision loss, double vision fatigue gait or balance problems ROS:   14 system review of systems is positive for knee pain, hip pain, gait and balance difficulties and all other systems negative  PMH:  Past Medical History:  Diagnosis Date   CAD (coronary artery disease)    cath 5/23 100% dist RCA lesion treated with 2 overlapping Integrity Resolute DES ( 2.25x27mm, 2.25x68mm), 60% mid RCA treated medically, 99% OM2 not amenable to PCI, 70% D1 lesion, EF normal   Hypercholesteremia    Hypertension    Thyroid disease    Type 2 diabetes mellitus ([place]) 10/08/2018    Social History:  Social History   Socioeconomic History   Marital status: Married    Spouse name: Not on file   Number of children: Not on file   Years of education: Not on file   Highest education level: Not on file  Occupational History   Not on file  Social Needs   Financial resource strain: Not on file   Food insecurity:    Worry: Not on file    Inability: Not on file   Transportation needs:    Medical: Not on file    Non-medical: Not on file  Tobacco Use   Smoking  status: Former Smoker    Packs/day: 0.25    Types: Cigarettes    Last attempt to quit: 10/07/2018    Years since quitting: 0.0   Smokeless tobacco: Never Used  Substance and Sexual Activity   Alcohol use: No    Alcohol/week: 0.0 standard drinks   Drug use: No   Sexual activity: Not on file  Lifestyle   Physical activity:    Days per week: Not on file    Minutes per session: Not on file   Stress: Not on file  Relationships   Social connections:    Talks on phone: Not on file    Gets together:  Not on file    Attends religious service: Not on file    Active member of club or organization: Not on file    Attends meetings of clubs or organizations: Not on file    Relationship status: Not on file   Intimate partner violence:    Fear of current or ex partner: Not on file    Emotionally abused: Not on file    Physically abused: Not on file    Forced sexual activity: Not on file  Other Topics Concern   Not on file  Social History Narrative   Not on file    Medications:   Current Outpatient Medications on File Prior to Visit  Medication Sig Dispense Refill   amLODipine (NORVASC) 10 MG tablet Take 1 tablet (10 mg total) by mouth daily for 30 days. 30 tablet 5   aspirin 81 MG EC tablet Take 2 tablets (162 mg total) by mouth daily.     atorvastatin (LIPITOR) 80 MG tablet Take 1 tablet (80 mg total) by mouth daily at 6 PM. (Patient taking differently: Take 80 mg by mouth every morning. ) 90 tablet 2   buPROPion (WELLBUTRIN SR) 150 MG 12 hr tablet Take 1 tablet by mouth twice daily (Patient taking differently: Take 150 mg by mouth daily. ) 180 tablet 0   carvedilol (COREG) 12.5 MG tablet Take 1 tablet (12.5 mg total) by mouth 2 (two) times daily with a meal. (Patient taking differently: Take 12.5 mg by mouth daily. ) 90 tablet 2   DULoxetine (CYMBALTA) 60 MG capsule Take 1 capsule by mouth once daily 90 capsule 30   Ezetimibe (ZETIA PO) Take by mouth.     furosemide (LASIX) 20 MG tablet Take 1 tablet (20 mg total) by mouth daily. (Patient taking differently: Take 20 mg by mouth daily as needed for fluid. ) 90 tablet 2   HYDROcodone-acetaminophen (NORCO/VICODIN) 5-325 MG tablet One tablet by mouth every six hours as needed for pain. Must last 30 days 60 tablet 0   insulin lispro (HUMALOG) 100 UNIT/ML injection Inject 1-40 Units into the skin continuous. Max bolus dose is 40 units Via pump     levothyroxine (SYNTHROID) 137 MCG tablet Take 137 mcg by mouth daily before  breakfast.     lisinopril (PRINIVIL,ZESTRIL) 40 MG tablet Take 1 tablet (40 mg total) by mouth daily. 90 tablet 3   meclizine (ANTIVERT) 25 MG tablet Take 1 tablet (25 mg total) by mouth 3 (three) times daily as needed for dizziness. 30 tablet 0   nitroGLYCERIN (NITROSTAT) 0.4 MG SL tablet Place 1 tablet (0.4 mg total) under the tongue every 5 (five) minutes x 3 doses as needed for chest pain. 25 tablet 3   sitaGLIPtin (JANUVIA) 100 MG tablet Take 100 mg by mouth daily.      No current  facility-administered medications on file prior to visit.     Allergies:  No Known Allergies  Physical Exam General: Mildly obese middle-aged Caucasian lady seated, in no evident distress Head: head normocephalic and atraumatic.   Neck: supple with no carotid or supraclavicular bruits Cardiovascular: regular rate and rhythm, no murmurs Musculoskeletal: no deformity Skin:  no rash/petichiae Vascular:  Normal pulses all extremities  Neurologic Exam Mental Status: Awake and fully alert. Oriented to place and time. Recent and remote memory intact. Attention span, concentration and fund of knowledge appropriate. Mood and affect appropriate.  Cranial Nerves: Fundoscopic exam reveals sharp disc margins. Pupils equal, briskly reactive to light. Extraocular movements full without nystagmus. Visual fields full to confrontation. Hearing intact. Facial sensation intact. Face, tongue, palate moves normally and symmetrically.  Motor: Normal bulk and tone. Normal strength in all tested extremity muscles.  Diminished fine finger movements on the left.  Orbits right over left upper extremity. Sensory.: intact to touch , pinprick , position and vibratory sensation.  Coordination: Rapid alternating movements normal in all extremities. Finger-to-nose and heel-to-shin performed accurately bilaterally. Gait and Station: Arises from chair without difficulty. Stance is normal. Gait demonstrates favoring of right knee and hip due  to pain.  Uses a cane..   Reflexes: 1+ and symmetric. Toes downgoing.   NIHSS  0 Modified Rankin  1   ASSESSMENT: 63 year old lady left upper skeletal corner infarct April 2020 secondary to small vessel disease with multiple vascular risk factors diabetes, hypertension, hyperlipidemia, smoking mild obesity Prior history of right periventricular infarct a year ago.    PLAN: I had a long d/w patient about her recent lacunar stroke, risk for recurrent stroke/TIAs, personally independently reviewed imaging studies and stroke evaluation results and answered questions.Continue aspirin 81 mg daily discontinue Plavix as it has been 3 weeks for secondary stroke prevention and maintain strict control of hypertension with blood pressure goal below 130/90, diabetes with hemoglobin A1c goal below 6.5% and lipids with LDL cholesterol goal below 70 mg/dL. I also advised the patient to eat a healthy diet with plenty of whole grains, cereals, fruits and vegetables, exercise regularly and maintain ideal body weight.  She was counseled to quit smoking and is agreeable.  She is scheduled to undergo right knee replacement surgery on 11/27/2018 this may happen if she can stay on aspirin.  In case aspirin needs to be stopped she will have to wait at least 3 months since her stroke to avoid the maximum stroke recurrence. period.  Greater than 50% time during this 45-minute consultation visit was spent on counseling and coordination of care: a bout her lacunar stroke and discussion about stroke prevention and treatment and answering questions.  Followup in the future with my nurse practitioner Janett Billow in 3 months or call earlier if necessary Antony Contras, MD Note: This document was prepared with digital dictation and possible smart phrase technology. Any transcriptional errors that result from this process are unintentional.

## 2018-10-31 NOTE — Telephone Encounter (Signed)
I called her to advise and she is having pain, wants injection scheduled her to come in for the injection on Friday  She answered no to all screening questions regarding Covid 19

## 2018-10-31 NOTE — Telephone Encounter (Signed)
6 months before she can have surgery

## 2018-10-31 NOTE — Telephone Encounter (Signed)
Patient was evaluated by Neurology today Plavix was d/c Neurology states 3 weeks on this medication  And she was placed on baby ASA She did have a stroke  She wants to proceed with surgery in June , please review note from Neurology and advise.

## 2018-10-31 NOTE — Telephone Encounter (Signed)
Patient called and LM asking to speak with you, please call her.

## 2018-10-31 NOTE — Patient Instructions (Signed)
I had a long d/w patient about her recent lacunar stroke, risk for recurrent stroke/TIAs, personally independently reviewed imaging studies and stroke evaluation results and answered questions.Continue aspirin 81 mg daily discontinue Plavix as it has been 3 weeks for secondary stroke prevention and maintain strict control of hypertension with blood pressure goal below 130/90, diabetes with hemoglobin A1c goal below 6.5% and lipids with LDL cholesterol goal below 70 mg/dL. I also advised the patient to eat a healthy diet with plenty of whole grains, cereals, fruits and vegetables, exercise regularly and maintain ideal body weight.  She was counseled to quit smoking and is agreeable.  She is scheduled to undergo right knee replacement surgery on 11/27/2018 this may happen if she can stay on aspirin.  In case aspirin needs to be stopped she will have to wait at least 3 months since her stroke to avoid the maximum stroke recurrence. period.  Followup in the future with my nurse practitioner Janett Billow in 3 months or call earlier if necessary  Stroke Prevention Some medical conditions and behaviors are associated with a higher chance of having a stroke. You can help prevent a stroke by making nutrition, lifestyle, and other changes, including managing any medical conditions you may have. What nutrition changes can be made?   Eat healthy foods. You can do this by: ? Choosing foods high in fiber, such as fresh fruits and vegetables and whole grains. ? Eating at least 5 or more servings of fruits and vegetables a day. Try to fill half of your plate at each meal with fruits and vegetables. ? Choosing lean protein foods, such as lean cuts of meat, poultry without skin, fish, tofu, beans, and nuts. ? Eating low-fat dairy products. ? Avoiding foods that are high in salt (sodium). This can help lower blood pressure. ? Avoiding foods that have saturated fat, trans fat, and cholesterol. This can help prevent high  cholesterol. ? Avoiding processed and premade foods.  Follow your health care provider's specific guidelines for losing weight, controlling high blood pressure (hypertension), lowering high cholesterol, and managing diabetes. These may include: ? Reducing your daily calorie intake. ? Limiting your daily sodium intake to 1,500 milligrams (mg). ? Using only healthy fats for cooking, such as olive oil, canola oil, or sunflower oil. ? Counting your daily carbohydrate intake. What lifestyle changes can be made?  Maintain a healthy weight. Talk to your health care provider about your ideal weight.  Get at least 30 minutes of moderate physical activity at least 5 days a week. Moderate activity includes brisk walking, biking, and swimming.  Do not use any products that contain nicotine or tobacco, such as cigarettes and e-cigarettes. If you need help quitting, ask your health care provider. It may also be helpful to avoid exposure to secondhand smoke.  Limit alcohol intake to no more than 1 drink a day for nonpregnant women and 2 drinks a day for men. One drink equals 12 oz of beer, 5 oz of wine, or 1 oz of hard liquor.  Stop any illegal drug use.  Avoid taking birth control pills. Talk to your health care provider about the risks of taking birth control pills if: ? You are over 77 years old. ? You smoke. ? You get migraines. ? You have ever had a blood clot. What other changes can be made?  Manage your cholesterol levels. ? Eating a healthy diet is important for preventing high cholesterol. If cholesterol cannot be managed through diet alone, you may also  need to take medicines. ? Take any prescribed medicines to control your cholesterol as told by your health care provider.  Manage your diabetes. ? Eating a healthy diet and exercising regularly are important parts of managing your blood sugar. If your blood sugar cannot be managed through diet and exercise, you may need to take medicines.  ? Take any prescribed medicines to control your diabetes as told by your health care provider.  Control your hypertension. ? To reduce your risk of stroke, try to keep your blood pressure below 130/80. ? Eating a healthy diet and exercising regularly are an important part of controlling your blood pressure. If your blood pressure cannot be managed through diet and exercise, you may need to take medicines. ? Take any prescribed medicines to control hypertension as told by your health care provider. ? Ask your health care provider if you should monitor your blood pressure at home. ? Have your blood pressure checked every year, even if your blood pressure is normal. Blood pressure increases with age and some medical conditions.  Get evaluated for sleep disorders (sleep apnea). Talk to your health care provider about getting a sleep evaluation if you snore a lot or have excessive sleepiness.  Take over-the-counter and prescription medicines only as told by your health care provider. Aspirin or blood thinners (antiplatelets or anticoagulants) may be recommended to reduce your risk of forming blood clots that can lead to stroke.  Make sure that any other medical conditions you have, such as atrial fibrillation or atherosclerosis, are managed. What are the warning signs of a stroke? The warning signs of a stroke can be easily remembered as BEFAST.  B is for balance. Signs include: ? Dizziness. ? Loss of balance or coordination. ? Sudden trouble walking.  E is for eyes. Signs include: ? A sudden change in vision. ? Trouble seeing.  F is for face. Signs include: ? Sudden weakness or numbness of the face. ? The face or eyelid drooping to one side.  A is for arms. Signs include: ? Sudden weakness or numbness of the arm, usually on one side of the body.  S is for speech. Signs include: ? Trouble speaking (aphasia). ? Trouble understanding.  T is for time. ? These symptoms may represent a  serious problem that is an emergency. Do not wait to see if the symptoms will go away. Get medical help right away. Call your local emergency services (911 in the [place] Do not drive yourself to the hospital.  Other signs of stroke may include: ? A sudden, severe headache with no known cause. ? Nausea or vomiting. ? Seizure. Where to find more information For more information, visit:  American Stroke Association: www.strokeassociation.org  National Stroke Association: www.stroke.org Summary  You can prevent a stroke by eating healthy, exercising, not smoking, limiting alcohol intake, and managing any medical conditions you may have.  Do not use any products that contain nicotine or tobacco, such as cigarettes and e-cigarettes. If you need help quitting, ask your health care provider. It may also be helpful to avoid exposure to secondhand smoke.  Remember BEFAST for warning signs of stroke. Get help right away if you or a loved one has any of these signs. This information is not intended to replace advice given to you by your health care provider. Make sure you discuss any questions you have with your health care provider. Document Released: 07/07/2004 Document Revised: 07/05/2016 Document Reviewed: 07/05/2016 Elsevier Interactive Patient Education  2019 [employer].

## 2018-11-01 ENCOUNTER — Telehealth: Payer: Self-pay | Admitting: Family Medicine

## 2018-11-01 NOTE — Telephone Encounter (Signed)
Jennifer with [facility] called the office today to request the patient's diagnosis related to her stroke be faxed over to them.   Joylene [phone]  PHONE- [phone] EX- [phone]

## 2018-11-01 NOTE — Telephone Encounter (Signed)
Left message on VM for Anderson Malta asking for more clarification as to what she is needing.

## 2018-11-02 ENCOUNTER — Encounter: Payer: Self-pay | Admitting: Orthopedic Surgery

## 2018-11-02 ENCOUNTER — Ambulatory Visit (INDEPENDENT_AMBULATORY_CARE_PROVIDER_SITE_OTHER): Payer: 59 | Admitting: Orthopedic Surgery

## 2018-11-02 ENCOUNTER — Other Ambulatory Visit: Payer: Self-pay

## 2018-11-02 VITALS — BP 155/85 | HR 75 | Temp 97.5°F | Ht 67.0 in | Wt 217.0 lb

## 2018-11-02 DIAGNOSIS — G8929 Other chronic pain: Secondary | ICD-10-CM

## 2018-11-02 DIAGNOSIS — M25561 Pain in right knee: Secondary | ICD-10-CM | POA: Diagnosis not present

## 2018-11-02 DIAGNOSIS — M25562 Pain in left knee: Secondary | ICD-10-CM

## 2018-11-02 NOTE — Patient Instructions (Addendum)
OOW 6 MONTHS   You have received an injection of steroids into the joint. 15% of patients will have increased pain within the 24 hours postinjection.   This is transient and will go away.   We recommend that you use ice packs on the injection site for 20 minutes every 2 hours and extra strength Tylenol 2 tablets every 8 as needed until the pain resolves.  If you continue to have pain after taking the Tylenol and using the ice please call the office for further instructions.

## 2018-11-02 NOTE — Progress Notes (Signed)
Chief Complaint  Patient presents with  . Knee Pain    right wants injeciton also has left knee pain   . Leg Pain    worse after stroke    Knee surgery canceled secondary to stroke knee surgery postponed for 6 months Wants 2 injections   Procedure note for bilateral knee injections  Procedure note left knee injection verbal consent was obtained to inject left knee joint  Timeout was completed to confirm the site of injection  The medications used were 40 mg of Depo-Medrol and 1% lidocaine 3 cc  Anesthesia was provided by ethyl chloride and the skin was prepped with alcohol.  After cleaning the skin with alcohol a 20-gauge needle was used to inject the left knee joint. There were no complications. A sterile bandage was applied.   Procedure note right knee injection verbal consent was obtained to inject right knee joint  Timeout was completed to confirm the site of injection  The medications used were 40 mg of Depo-Medrol and 1% lidocaine 3 cc  Anesthesia was provided by ethyl chloride and the skin was prepped with alcohol.  After cleaning the skin with alcohol a 20-gauge needle was used to inject the right knee joint. There were no complications. A sterile bandage was applied.   Encounter Diagnoses  Name Primary?  . Chronic pain of left knee Yes  . Chronic pain of right knee

## 2018-11-07 ENCOUNTER — Encounter: Payer: Self-pay | Admitting: Orthopedic Surgery

## 2018-11-09 DIAGNOSIS — Z79891 Long term (current) use of opiate analgesic: Secondary | ICD-10-CM

## 2018-11-09 DIAGNOSIS — J449 Chronic obstructive pulmonary disease, unspecified: Secondary | ICD-10-CM

## 2018-11-09 DIAGNOSIS — E1142 Type 2 diabetes mellitus with diabetic polyneuropathy: Secondary | ICD-10-CM

## 2018-11-09 DIAGNOSIS — I2510 Atherosclerotic heart disease of native coronary artery without angina pectoris: Secondary | ICD-10-CM

## 2018-11-09 DIAGNOSIS — I69393 Ataxia following cerebral infarction: Secondary | ICD-10-CM | POA: Diagnosis not present

## 2018-11-09 DIAGNOSIS — R42 Dizziness and giddiness: Secondary | ICD-10-CM | POA: Diagnosis not present

## 2018-11-09 DIAGNOSIS — E669 Obesity, unspecified: Secondary | ICD-10-CM

## 2018-11-09 DIAGNOSIS — Z794 Long term (current) use of insulin: Secondary | ICD-10-CM

## 2018-11-09 DIAGNOSIS — Z7902 Long term (current) use of antithrombotics/antiplatelets: Secondary | ICD-10-CM

## 2018-11-09 DIAGNOSIS — F4323 Adjustment disorder with mixed anxiety and depressed mood: Secondary | ICD-10-CM

## 2018-11-09 DIAGNOSIS — Z720 Tobacco use: Secondary | ICD-10-CM

## 2018-11-09 DIAGNOSIS — Z7982 Long term (current) use of aspirin: Secondary | ICD-10-CM

## 2018-11-09 DIAGNOSIS — I10 Essential (primary) hypertension: Secondary | ICD-10-CM

## 2018-11-09 DIAGNOSIS — E785 Hyperlipidemia, unspecified: Secondary | ICD-10-CM

## 2018-11-09 DIAGNOSIS — Z8673 Personal history of transient ischemic attack (TIA), and cerebral infarction without residual deficits: Secondary | ICD-10-CM

## 2018-11-14 ENCOUNTER — Encounter: Payer: Self-pay | Admitting: Student

## 2018-11-14 ENCOUNTER — Ambulatory Visit (INDEPENDENT_AMBULATORY_CARE_PROVIDER_SITE_OTHER): Payer: 59 | Admitting: Student

## 2018-11-14 ENCOUNTER — Telehealth: Payer: Self-pay | Admitting: *Deleted

## 2018-11-14 VITALS — Ht 67.0 in | Wt 223.0 lb

## 2018-11-14 DIAGNOSIS — E785 Hyperlipidemia, unspecified: Secondary | ICD-10-CM

## 2018-11-14 DIAGNOSIS — Z8673 Personal history of transient ischemic attack (TIA), and cerebral infarction without residual deficits: Secondary | ICD-10-CM

## 2018-11-14 DIAGNOSIS — Z7189 Other specified counseling: Secondary | ICD-10-CM

## 2018-11-14 DIAGNOSIS — I10 Essential (primary) hypertension: Secondary | ICD-10-CM

## 2018-11-14 DIAGNOSIS — I2510 Atherosclerotic heart disease of native coronary artery without angina pectoris: Secondary | ICD-10-CM

## 2018-11-14 DIAGNOSIS — I6523 Occlusion and stenosis of bilateral carotid arteries: Secondary | ICD-10-CM

## 2018-11-14 NOTE — Telephone Encounter (Signed)

## 2018-11-14 NOTE — Progress Notes (Signed)
Virtual Visit via Telephone Note   This visit type was conducted due to national recommendations for restrictions regarding the COVID-19 Pandemic (e.g. social distancing) in an effort to limit this patient's exposure and mitigate transmission in our community.  Due to her co-morbid illnesses, this patient is at least at moderate risk for complications without adequate follow up.  This format is felt to be most appropriate for this patient at this time.  The patient did not have access to video technology/had technical difficulties with video requiring transitioning to audio format only (telephone).  All issues noted in this document were discussed and addressed.  No physical exam could be performed with this format.  Please refer to the patient's chart for her  consent to telehealth for [facility].   Date:  11/14/2018   ID:  Jennifer [name], DOB 1956-02-16, MRN [id number]  Patient Location: Home Provider Location: Office  PCP:  Abner Greenspan, MD  Cardiologist:  Kate Sable, MD  Electrophysiologist:  None   Evaluation Performed:  Follow-Up Visit  Chief Complaint:  Recent Hospitalization for CVA  History of Present Illness:    Jennifer [name] is a 63 y.o. female with past medical history of CAD (s/p NSTEMI in 2017 with DESx2 to RCA), HTN, HLD, IDDM, and prior tobacco use who presents for a telehealth visit for hospital follow-up.   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 Carotid dopplers showed 3-24% LICA stenosis. Echo with bubble study showed a preserved EF of 55-60% with moderate HK of the left ventricular, basal inferior wall. She did have evidence of a PFO with right to left shunting noted. Was started on DAPT with ASA and Plavix for 2 months with instructions to be on Plavix as monotherapy afterwards.   She did  follow-up with Dr. Leonie Man on 10/31/2018 and it was recommended to discontinue Plavix and continue ASA 162mg  daily.  In talking with the patient today, she reports overall doing well from a cardiac perspective since her last office visit. Denies any recent chest pain or dyspnea on exertion. Reports her activity level has decreased secondary to right knee pain. Was initially suppose to undergo a knee replacement last month but this was postponed due to COVID and her recent CVA.   She denies any recent orthopnea, PND, or lower extremity edema. She does not have a BP cuff at home and is unaware of any recent readings. Says her daughter is a Occupational hygienist and can check it later this week.   Since her most recent CVA, she has quit smoking. Denies any cravings at this time but her husband continues to smoke.   The patient does not have symptoms concerning for COVID-19 infection (fever, chills, cough, or new shortness of breath).    Past Medical History:  Diagnosis Date  . CAD (coronary artery disease)    cath 5/23 100% dist RCA lesion treated with 2 overlapping Integrity Resolute DES ( 2.25x24mm, 2.25x28mm), 60% mid RCA treated medically, 99% OM2 not amenable to PCI, 70% D1 lesion, EF normal  . Hypercholesteremia   . Hypertension   . Thyroid disease   . Type 2 diabetes mellitus ([place]) 10/08/2018   Past Surgical History:  Procedure Laterality Date  . ABDOMINAL HYSTERECTOMY    . CARDIAC CATHETERIZATION N/A 11/03/2015   Procedure: Left Heart Cath and Coronary  Angiography;  Surgeon: Leonie Man, MD;  Location: [place] [facility];  Service: Cardiovascular;  Laterality: N/A;  . CARDIAC CATHETERIZATION N/A 11/03/2015   Procedure: Coronary Stent Intervention;  Surgeon: Leonie Man, MD;  Location: [place] [facility];  Service: Cardiovascular;  Laterality: N/A;  . CARPAL TUNNEL RELEASE       Current Meds  Medication Sig  . amLODipine (NORVASC) 10 MG tablet Take 1 tablet (10 mg total) by mouth daily  for 30 days.  Marland Kitchen aspirin 81 MG EC tablet Take 2 tablets (162 mg total) by mouth daily.  Marland Kitchen atorvastatin (LIPITOR) 80 MG tablet Take 1 tablet (80 mg total) by mouth daily at 6 PM. (Patient taking differently: Take 80 mg by mouth every morning. )  . buPROPion (WELLBUTRIN SR) 150 MG 12 hr tablet Take 1 tablet by mouth twice daily (Patient taking differently: Take 150 mg by mouth daily. )  . carvedilol (COREG) 12.5 MG tablet Take 1 tablet (12.5 mg total) by mouth 2 (two) times daily with a meal. (Patient taking differently: Take 12.5 mg by mouth daily. )  . DULoxetine (CYMBALTA) 60 MG capsule Take 1 capsule by mouth once daily  . Ezetimibe (ZETIA PO) Take by mouth.  . furosemide (LASIX) 20 MG tablet Take 1 tablet (20 mg total) by mouth daily. (Patient taking differently: Take 20 mg by mouth daily as needed for fluid. )  . HYDROcodone-acetaminophen (NORCO/VICODIN) 5-325 MG tablet One tablet by mouth every six hours as needed for pain. Must last 30 days  . insulin lispro (HUMALOG) 100 UNIT/ML injection Inject 1-40 Units into the skin continuous. Max bolus dose is 40 units Via pump  . levothyroxine (SYNTHROID) 137 MCG tablet Take 137 mcg by mouth daily before breakfast.  . lisinopril (PRINIVIL,ZESTRIL) 40 MG tablet Take 1 tablet (40 mg total) by mouth daily.  . meclizine (ANTIVERT) 25 MG tablet Take 1 tablet (25 mg total) by mouth 3 (three) times daily as needed for dizziness.  . nitroGLYCERIN (NITROSTAT) 0.4 MG SL tablet Place 1 tablet (0.4 mg total) under the tongue every 5 (five) minutes x 3 doses as needed for chest pain.  . sitaGLIPtin (JANUVIA) 100 MG tablet Take 100 mg by mouth daily.      Allergies:   Patient has no known allergies.   Social History   Tobacco Use  . Smoking status: Former Smoker    Packs/day: 0.25    Types: Cigarettes    Last attempt to quit: 10/07/2018    Years since quitting: 0.1  . Smokeless tobacco: Never Used  Substance Use Topics  . Alcohol use: No    Alcohol/week:  0.0 standard drinks  . Drug use: No     Family Hx: The patient's family history includes Heart attack in her brother; Heart disease in her mother; Stroke in her sister.  ROS:   Please see the history of present illness.     All other systems reviewed and are negative.   Prior CV studies:   The following studies were reviewed today:  Echocardiogram: 09/2018 IMPRESSIONS  1. The left ventricle has normal systolic function, with an ejection fraction of 55-60%. The cavity size was normal. Left ventricular diastolic Doppler parameters are consistent with impaired relaxation. No evidence of left ventricular regional wall  motion abnormalities.  2. Moderate hypokinesis of the left ventricular, basal inferior wall.  3. The right ventricle has normal systolic function. The cavity was normal. There is no increase in right ventricular wall thickness.  Right ventricular systolic pressure normal with an estimated pressure of 32.0 mmHg.  4. The aortic valve is tricuspid. Mild aortic annular calcification noted.  5. The mitral valve is grossly normal. There is mild mitral annular calcification present.  6. The tricuspid valve is grossly normal.  7. The aortic root is normal in size and structure.  8. There is aneurysmal motion of the interatrial septum with evidence of PFO by agitated saline injection, right to left shunting seen intermittently.  Carotid Dopplers: 09/2018 IMPRESSION: 1. No significant atherosclerotic plaque or evidence of stenosis in the right internal carotid artery. 2. Mild (1-49%) stenosis proximal left internal carotid artery secondary to trace smooth heterogeneous atherosclerotic plaque. 3. Vertebral arteries are patent with normal antegrade flow.  Labs/Other Tests and Data Reviewed:    EKG:  An ECG dated 10/07/2018 was personally reviewed today and demonstrated:  NSR, HR 81, with PVC's.   Recent Labs: 10/08/2018: ALT 17; Hemoglobin 12.8; Platelets 211 10/09/2018: BUN 18;  Creatinine, Ser 0.83; Potassium 3.5; Sodium 141   Recent Lipid Panel Lab Results  Component Value Date/Time   CHOL 173 10/10/2018 05:09 AM   TRIG 140 10/10/2018 05:09 AM   HDL 37 (L) 10/10/2018 05:09 AM   CHOLHDL 4.7 10/10/2018 05:09 AM   LDLCALC 108 (H) 10/10/2018 05:09 AM   LDLDIRECT 154.2 11/04/2011 10:18 AM    Wt Readings from Last 3 Encounters:  11/14/18 223 lb (101.2 kg)  11/02/18 217 lb (98.4 kg)  10/31/18 225 lb (102.1 kg)     Objective:    Vital Signs:  Ht 5\' 7"  (1.702 m)   Wt 223 lb (101.2 kg)   BMI 34.93 kg/m    General: Pleasant female sounding in NAD Psych: Normal affect. Neuro: Alert and oriented X 3.  Lungs:  Resp regular and unlabored while talking on the phone.   ASSESSMENT & PLAN:    1. CAD - she is s/p NSTEMI in 2017 with DESx2 to RCA. Recent echo showed a preserved EF of 55-60% with WMA as noted on prior imaging in 2019. - she denies any recent chest pain or dyspnea on exertion.  - continue ASA (on 162mg  daily given recent CVA), BB, Zetia, and statin therapy.   2. HTN - she has not checked this recently at home but her daughter who is a Emergency planning/management officer will check later this week. I encouraged her to report back if BP is above goal. - continue Amlodipine 10mg  daily, Coreg 12.5mg  BID, and Lisinopril 40mg  faily.   3. HLD - FLP during recent admission showed total cholesterol of 173, HDL 37, and LDL 108. She has been on Atorvastatin 80mg  daily and was recently started on Zetia 10mg  daily by her Endocrinologist. We did discuss PCSK-9 inhibitor therapy if unable to get at goal of LDL < 70 given her CAD and recent CVA.  4. Carotid Artery Stenosis - recent dopplers showed 3-26% LICA stenosis as outlined above. Continue to follow. Remains on ASA and statin therapy.   5. History of CVA - recent echo with bubble study did show evidence of a PFO. Reviewed with the patient today. By review of notes, her recent CVA was not felt to be embolic. If Neurology  wishes for her to have a 30-day monitor in the future, we would be happy to arrange.   6.  COVID-19 Education - The signs and symptoms of COVID-19 were discussed with the patient and how to seek care for testing. The importance of social distancing was  discussed today.  Time:   Today, I have spent 18 minutes with the patient with telehealth technology discussing the above problems.     Medication Adjustments/Labs and Tests Ordered: Current medicines are reviewed at length with the patient today.  Concerns regarding medicines are outlined above.   Tests Ordered: No orders of the defined types were placed in this encounter.   Medication Changes: No orders of the defined types were placed in this encounter.   Disposition:  Follow up with Dr. Lorrine Kin in 6 months.   Signed, Erma Heritage, PA-C  11/14/2018 1:10 PM    [facility]

## 2018-11-14 NOTE — Patient Instructions (Signed)
Medication Instructions:  Your physician recommends that you continue on your current medications as directed. Please refer to the Current Medication list given to you today.   Labwork: NONE   Testing/Procedures: NONE   Follow-Up: Your physician wants you to follow-up in: 6 Months with Dr. Koneswaran. You will receive a reminder letter in the mail two months in advance. If you don't receive a letter, please call our office to schedule the follow-up appointment.   Any Other Special Instructions Will Be Listed Below (If Applicable).     If you need a refill on your cardiac medications before your next appointment, please call your pharmacy.  Thank you for choosing [facility]!   

## 2018-11-20 ENCOUNTER — Other Ambulatory Visit (HOSPITAL_COMMUNITY): Payer: Self-pay | Admitting: Family Medicine

## 2018-11-20 DIAGNOSIS — Z1231 Encounter for screening mammogram for malignant neoplasm of breast: Secondary | ICD-10-CM

## 2018-12-04 LAB — HM DIABETES EYE EXAM

## 2018-12-11 ENCOUNTER — Encounter: Payer: Self-pay | Admitting: Family Medicine

## 2018-12-19 ENCOUNTER — Telehealth: Payer: Self-pay | Admitting: Orthopaedic Surgery

## 2018-12-19 MED ORDER — HYDROCODONE-ACETAMINOPHEN 5-325 MG PO TABS: ORAL_TABLET | ORAL | 0 refills | Status: DC

## 2018-12-19 NOTE — Telephone Encounter (Signed)
Patient called for refill of pain medication - last prescribed by Dr Luna Glasgow 09/20/18 (Dr Aline Brochure had seen patient intermittently regarding possible knee surgery)  HYDROcodone-acetaminophen (NORCO/VICODIN) 5-325 MG tablet 60 tablet   - [place]

## 2018-12-19 NOTE — Telephone Encounter (Signed)
Called back to patient, relayed. States just spoke with [place], and that Dr Luna Glasgow did approve the refill.  Patient is aware of next scheduled appointment in August.

## 2018-12-19 NOTE — Telephone Encounter (Signed)
DECLINED DID NOT HAVE SURGERY

## 2018-12-19 NOTE — Telephone Encounter (Signed)
Dr Aline Brochure to review refill request (Dr Luna Glasgow routed the request back to me - last seen by Dr Aline Brochure - surgery was scheduled then cancelled due to medical reasons):  HYDROcodone-acetaminophen (NORCO/VICODIN) 5-325 MG Tablet / 60 tablet  - Jennifer [name]

## 2019-01-03 ENCOUNTER — Other Ambulatory Visit: Payer: Self-pay | Admitting: Cardiovascular Disease

## 2019-01-03 ENCOUNTER — Other Ambulatory Visit: Payer: Self-pay

## 2019-01-03 MED ORDER — FUROSEMIDE 20 MG PO TABS: 20.0000 mg | ORAL_TABLET | Freq: Every day | ORAL | 2 refills | Status: DC

## 2019-01-03 NOTE — Telephone Encounter (Signed)
Erma Heritage, PA-C  Bernita Raisin, RN        Yes, by review of records she has been on Lasix 20mg  daily since 2018. Would refill as such.   Thanks,  [place]

## 2019-01-28 ENCOUNTER — Telehealth: Payer: Self-pay | Admitting: Adult Health

## 2019-01-28 NOTE — Telephone Encounter (Signed)
I called patient regarding rescheduling 8/28 appointment due to our office being closed. LVM requesting patient call back to reschedule.

## 2019-02-01 ENCOUNTER — Ambulatory Visit: Payer: 59 | Admitting: Orthopedic Surgery

## 2019-02-04 ENCOUNTER — Other Ambulatory Visit: Payer: Self-pay

## 2019-02-04 ENCOUNTER — Ambulatory Visit (INDEPENDENT_AMBULATORY_CARE_PROVIDER_SITE_OTHER): Payer: 59 | Admitting: Orthopedic Surgery

## 2019-02-04 VITALS — BP 169/95 | HR 83 | Ht 67.0 in | Wt 252.0 lb

## 2019-02-04 DIAGNOSIS — M1710 Unilateral primary osteoarthritis, unspecified knee: Secondary | ICD-10-CM

## 2019-02-04 DIAGNOSIS — M1711 Unilateral primary osteoarthritis, right knee: Secondary | ICD-10-CM | POA: Diagnosis not present

## 2019-02-04 NOTE — Progress Notes (Signed)
Chief Complaint  Patient presents with  . Knee Pain    wants to RS Knee replacement right knee s/p CVA in April     BP (!) 169/95   Pulse 83   Ht 5\' 7"  (1.702 m)   Wt 252 lb (114.3 kg)   BMI 39.63 kg/m   63 year old female scheduled for right total knee canceled for COVID-19 then developed hemorrhagic stroke surgery was canceled and postponed for 6 months  Patient is also diabetic with history of hypothyroidism again history of stroke and also has hypertension  Review of systems she says she has been doing well otherwise feels pretty good  She is ambulatory with a cane she is limping on the right side she has pain and swelling of the right knee range of motion is limited 100 degrees her knee is stable muscle tone is normal  Mild peripheral edema right leg  Skin looks clean  Encounter Diagnosis  Name Primary?  . Primary localized osteoarthritis of knee Yes    Procedure note right knee injection   verbal consent was obtained to inject right knee joint  Timeout was completed to confirm the site of injection  The medications used were 40 mg of Depo-Medrol and 1% lidocaine 3 cc  Anesthesia was provided by ethyl chloride and the skin was prepped with alcohol.  After cleaning the skin with alcohol a 20-gauge needle was used to inject the right knee joint. There were no complications. A sterile bandage was applied.  Follow-up in September to schedule surgery for October pending medical evaluation and GI evaluation

## 2019-02-04 NOTE — Patient Instructions (Signed)
Tell your primary care doctor you need clearance for knee replacement in October Tell your Neurologist you are having surgery for knee replacement in October Once they okay you, let us know and we will set you up for a Tuesday in October

## 2019-02-08 ENCOUNTER — Ambulatory Visit: Payer: 59 | Admitting: Adult Health

## 2019-02-12 ENCOUNTER — Ambulatory Visit: Payer: 59 | Admitting: Adult Health

## 2019-02-12 ENCOUNTER — Other Ambulatory Visit: Payer: Self-pay

## 2019-02-12 ENCOUNTER — Encounter: Payer: Self-pay | Admitting: Adult Health

## 2019-02-12 VITALS — BP 163/82 | HR 68 | Temp 97.5°F | Ht 67.0 in | Wt 269.8 lb

## 2019-02-12 DIAGNOSIS — I6381 Other cerebral infarction due to occlusion or stenosis of small artery: Secondary | ICD-10-CM

## 2019-02-12 DIAGNOSIS — E11618 Type 2 diabetes mellitus with other diabetic arthropathy: Secondary | ICD-10-CM | POA: Diagnosis not present

## 2019-02-12 DIAGNOSIS — E785 Hyperlipidemia, unspecified: Secondary | ICD-10-CM | POA: Diagnosis not present

## 2019-02-12 DIAGNOSIS — I10 Essential (primary) hypertension: Secondary | ICD-10-CM | POA: Diagnosis not present

## 2019-02-12 NOTE — Patient Instructions (Signed)
As you have been doing well from a stroke standpoint, you are cleared from a neurological standpoint to proceed with knee replacement surgery in October.  You will be at a small but acceptable preprocedure risk of reoccurring stroke while off aspirin therapy.  Your surgeon will advise you how long he will be off therapy for initially recommended to restart immediately after once able.  Continue aspirin 81 mg twice daily  and Lipitor for secondary stroke prevention  Continue to follow up with PCP regarding cholesterol, blood pressure and diabetes management   Continue to monitor blood pressure at home and follow-up with cardiology if above goal of 130/90 consistently  Maintain strict control of hypertension with blood pressure goal below 130/90, diabetes with hemoglobin A1c goal below 6.5% and cholesterol with LDL cholesterol (bad cholesterol) goal below 70 mg/dL. I also advised the patient to eat a healthy diet with plenty of whole grains, cereals, fruits and vegetables, exercise regularly and maintain ideal body weight.         Thank you for coming to see [place] at [facility]. I hope we have been able to provide you high quality care today.  You may receive a patient satisfaction survey over the next few weeks. We would appreciate your feedback and comments so that we may continue to improve ourselves and the health of our patients.

## 2019-02-12 NOTE — Progress Notes (Signed)
[facility] [address] [place]. [place] [address] [phone]       OFFICE FOLLOW UP NOTE  Ms. KARIANNA PACHA Date of Birth:  1955-10-15 Medical Record Number:  [id number]   Referring MD: Loura Pardon Reason for visit: Stroke follow-up  Chief Complaint  Patient presents with   Follow-up    Room 9, alone. Lacunar infarct. "No concerns, needs discuss future surgerical plans."      HPI:   Initial visit 10/31/2018 PS: Ms. Challa is a 63 year old lady with past medical history hypertension, hyperlipidemia, diabetes with peripheral neuropathy, COPD, hypothyroidism, coronary artery disease status post angioplasty tobacco abuse who presented on 10/07/2018 to [facility] hospital with sudden onset of dizziness and nausea and leaning to the left side when she woke up.  She reported mild mild vertigo as well.  Her blood pressure was found to be significantly elevated on admission at 222 4.  He had a nonfocal exam but was slightly off balance.  CT scan of the head showed remote age right basal ganglia lacunar infarct and small left cerebellar lacunar.  CT angiogram showed no large vessel stenosis or occlusion.  MRI scan of the brain showed a large 1.2 cm left lateral corpus callosum.  Diffusion positive lesion likely a large lacunar infarct.  There is no enhancement on postcontrast images.  There is also remote age right deep white matter lacunar noted.  There are extensive changes of chronic small vessel disease involving the brainstem.  Transthoracic echo showed normal ejection fraction.  LDL cholesterol was elevated milligrams percent and hemoglobin A1c was 6.4 patient did well discharged home with no therapy needs.  She was placed on dual antiplatelet therapy tolerated it well without bleeding or bruising.  Started on Lipitor 80 mg as well and is tolerating it without this.  She states her blood pressure is under good control but today in our office it is 160/86. she has started  eating healthy.  She is also trying to quit smoking.  She gives prior history of right brain infarct about a year ago on 11/14/17 when she had left body numbness recovered fairly quickly.  The patient denies any clinical history suggestive of multiple sclerosis in the form of transient vision loss, double vision fatigue gait or balance problems  Update 02/12/2019: Ms. Ryker is being seen today for stroke follow-up.  She has been stable from a stroke standpoint without residual or new/recurring stroke/TIA symptoms.  She has continued on aspirin 81 mg twice daily and atorvastatin without bleeding or bruising.  Blood pressure today elevated at 163/82.  She does not routinely monitor at home but does have wrist monitor.  She is requesting clearance to undergo right knee replacement in October with left knee replacement once recovered from right knee.  She continues to ambulate with a cane with limited gait due to bilateral knee pain.  No further concerns at this time.     ROS:   14 system review of systems is positive for knee pain and all other systems negative  PMH:  Past Medical History:  Diagnosis Date   CAD (coronary artery disease)    cath 5/23 100% dist RCA lesion treated with 2 overlapping Integrity Resolute DES ( 2.25x71mm, 2.25x50mm), 60% mid RCA treated medically, 99% OM2 not amenable to PCI, 70% D1 lesion, EF normal   Hypercholesteremia    Hypertension    Thyroid disease    Type 2 diabetes mellitus ([place]) 10/08/2018    Social History:  Social  History   Socioeconomic History   Marital status: Married    Spouse name: Not on file   Number of children: Not on file   Years of education: Not on file   Highest education level: Not on file  Occupational History   Not on file  Social Needs   Financial resource strain: Not on file   Food insecurity    Worry: Not on file    Inability: Not on file   Transportation needs    Medical: Not on file    Non-medical: Not on file    Tobacco Use   Smoking status: Former Smoker    Packs/day: 0.25    Types: Cigarettes    Quit date: 10/07/2018    Years since quitting: 0.3   Smokeless tobacco: Never Used  Substance and Sexual Activity   Alcohol use: No    Alcohol/week: 0.0 standard drinks   Drug use: No   Sexual activity: Not on file  Lifestyle   Physical activity    Days per week: Not on file    Minutes per session: Not on file   Stress: Not on file  Relationships   Social connections    Talks on phone: Not on file    Gets together: Not on file    Attends religious service: Not on file    Active member of club or organization: Not on file    Attends meetings of clubs or organizations: Not on file    Relationship status: Not on file   Intimate partner violence    Fear of current or ex partner: Not on file    Emotionally abused: Not on file    Physically abused: Not on file    Forced sexual activity: Not on file  Other Topics Concern   Not on file  Social History Narrative   Not on file    Medications:   Current Outpatient Medications on File Prior to Visit  Medication Sig Dispense Refill   amLODipine (NORVASC) 10 MG tablet Take 1 tablet (10 mg total) by mouth daily for 30 days. 30 tablet 5   atorvastatin (LIPITOR) 80 MG tablet Take 1 tablet (80 mg total) by mouth daily at 6 PM. (Patient taking differently: Take 80 mg by mouth every morning. ) 90 tablet 2   buPROPion (WELLBUTRIN SR) 150 MG 12 hr tablet Take 1 tablet by mouth twice daily (Patient taking differently: Take 150 mg by mouth daily. ) 180 tablet 0   carvedilol (COREG) 12.5 MG tablet Take 1 tablet (12.5 mg total) by mouth 2 (two) times daily with a meal. (Patient taking differently: Take 12.5 mg by mouth daily. ) 90 tablet 2   DULoxetine (CYMBALTA) 60 MG capsule Take 1 capsule by mouth once daily 90 capsule 30   Ezetimibe (ZETIA PO) Take by mouth.     furosemide (LASIX) 20 MG tablet Take 1 tablet (20 mg total) by mouth daily. 90  tablet 2   HYDROcodone-acetaminophen (NORCO/VICODIN) 5-325 MG tablet One tablet by mouth every six hours as needed for pain. Must last 30 days 60 tablet 0   insulin lispro (HUMALOG) 100 UNIT/ML injection Inject 1-40 Units into the skin continuous. Max bolus dose is 40 units Via pump     levothyroxine (SYNTHROID) 137 MCG tablet Take 137 mcg by mouth daily before breakfast.     lisinopril (PRINIVIL,ZESTRIL) 40 MG tablet Take 1 tablet (40 mg total) by mouth daily. 90 tablet 3   meclizine (ANTIVERT) 25 MG tablet Take  1 tablet (25 mg total) by mouth 3 (three) times daily as needed for dizziness. 30 tablet 0   nitroGLYCERIN (NITROSTAT) 0.4 MG SL tablet Place 1 tablet (0.4 mg total) under the tongue every 5 (five) minutes x 3 doses as needed for chest pain. 25 tablet 3   sitaGLIPtin (JANUVIA) 100 MG tablet Take 100 mg by mouth daily.      No current facility-administered medications on file prior to visit.     Allergies:  No Known Allergies  Today's Vitals   02/12/19 1302  BP: (!) 163/82  Pulse: 68  Temp: (!) 97.5 F (36.4 C)  Weight: 269 lb 12.8 oz (122.4 kg)  Height: 5\' 7"  (1.702 m)   Body mass index is 42.26 kg/m.   Physical Exam General: Mildly obese middle-aged Caucasian lady seated, in no evident distress Head: head normocephalic and atraumatic.   Neck: supple with no carotid or supraclavicular bruits Cardiovascular: regular rate and rhythm, no murmurs Musculoskeletal: no deformity; limited ROM BLE due to knee pain Skin:  no rash/petichiae Vascular:  Normal pulses all extremities  Neurologic Exam Mental Status: Awake and fully alert. Oriented to place and time. Recent and remote memory intact. Attention span, concentration and fund of knowledge appropriate. Mood and affect appropriate.  Cranial Nerves: Pupils equal, briskly reactive to light. Extraocular movements full without nystagmus. Visual fields full to confrontation. Hearing intact. Facial sensation intact. Face,  tongue, palate moves normally and symmetrically.  Motor: Normal bulk and tone. Normal strength in all tested extremity muscles except mildly decreased left grip strength and diminished fine finger movements on the left.  Orbits right over left upper extremity. Sensory.: intact to touch , pinprick , position and vibratory sensation.  Coordination: Rapid alternating movements normal in all extremities. Finger-to-nose and heel-to-shin performed accurately bilaterally. Gait and Station: Arises from chair without difficulty. Stance is normal. Gait demonstrates favoring of right knee and hip due to pain.  Uses a cane..   Reflexes: 1+ and symmetric. Toes downgoing.   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  She has been doing well from a neurological standpoint and is requesting clearance to undergo knee replacement in October.    PLAN: -She was cleared to undergo knee replacement surgery in October as at that time it will be 6 months since her stroke and has been stable since that time.  Discussion regarding stopping aspirin will be recommended by surgeon where she will be at a small but acceptable preprocedure risk of recurrent stroke off therapy.  Recommend initiating immediately after once hemodynamically stable. -Continue aspirin 81 mg twice daily and atorvastatin for secondary stroke prevention -Continue to follow with PCP/cardiology for HTN, HLD and DM management -Advised to start monitoring blood pressure at home and to contact cardiology if above goal -maintain strict control of hypertension with blood pressure goal below 130/90, diabetes with hemoglobin A1c goal below 6.5% and lipids with LDL cholesterol goal below 70 mg/dL. I  also advised the patient to eat a healthy diet with plenty of whole grains, cereals, fruits and vegetables, exercise regularly and maintain ideal body weight.   Overall stable from a stroke standpoint recommend follow-up as needed  Greater than 50% time during this 25-minute face-to-face visit was spent on counseling and coordination of  care regarding lacunar stroke and discussion about stroke prevention and treatment and answering questions along with discussion regarding undergoing surgical procedure  Frann Rider [facility]), AGNP-BC  [facility] [address] [place], [place] [address]  Phone [phone] Fax [phone] Note: This document was prepared with digital dictation and possible smart phrase technology. Any transcriptional errors that result from this process are unintentional.

## 2019-02-12 NOTE — Progress Notes (Signed)
I agree with the above plan 

## 2019-02-20 ENCOUNTER — Other Ambulatory Visit: Payer: Self-pay | Admitting: Family Medicine

## 2019-02-22 ENCOUNTER — Telehealth: Payer: Self-pay

## 2019-02-22 NOTE — Telephone Encounter (Signed)
Does she know what her pulse is ?  Is she supposed to take the carvedilol bid or qd (was it changed?   Have her check one more bp today when relaxed

## 2019-02-22 NOTE — Telephone Encounter (Signed)
Pt just check her BP (relaxed) it was 152/89 and pulse was 72. Her coreg was changed to just once daily

## 2019-02-22 NOTE — Telephone Encounter (Signed)
Left VM requesting pt to call the office back 

## 2019-02-22 NOTE — Telephone Encounter (Signed)
On 02/21/19 BP 158/127 and today BP 156/99; BP were taken with wrist cuff; on 02/13/19 pts daughter is a Marine scientist and took pts BP with arm cuff; pt could not remember what BP was but diastolic was 88. Today no CP,H/A,SOB,dizziness; pts  legs are swelling during the day but swelling goes down overnight (leg swelling has been going on for wks) pt has been taking lisinopril 40 mg daily in AM; furosemide 20 mg daily in AM and amlodipine 10 mg daily in AM. Pt also taking carvedilol 12.5 mg one daily at hs (med list has bid but pt only taking one a day).Pt has no covid symptoms, no travel and no known exposure to + covid. [facility] ED precautions given and pt voiced understanding. Pt scheduled 30' appt on 02/25/19 at 8 AM. Pt will come 28' early to get checked in at front desk. Pt request cb if any med adjustments for over the weekend.Please advise.

## 2019-02-22 NOTE — Telephone Encounter (Signed)
That is not tremendously high  Avoid excess sodium and keep an eye on it this weekend  No changes for now-I will see her Monday  If she develops a headache or severely high pressure (systolic over 0000000 call the triage line

## 2019-02-22 NOTE — Telephone Encounter (Signed)
Pt notified of Dr. [name]'s comments and instructions and verbalized understanding  

## 2019-02-25 ENCOUNTER — Other Ambulatory Visit: Payer: Self-pay | Admitting: Cardiology

## 2019-02-25 ENCOUNTER — Ambulatory Visit: Payer: 59 | Admitting: Family Medicine

## 2019-02-25 ENCOUNTER — Other Ambulatory Visit: Payer: Self-pay

## 2019-02-25 ENCOUNTER — Encounter: Payer: Self-pay | Admitting: Family Medicine

## 2019-02-25 VITALS — BP 148/86 | HR 79 | Temp 97.5°F | Ht 67.0 in | Wt 263.0 lb

## 2019-02-25 DIAGNOSIS — Z23 Encounter for immunization: Secondary | ICD-10-CM

## 2019-02-25 DIAGNOSIS — R600 Localized edema: Secondary | ICD-10-CM | POA: Diagnosis not present

## 2019-02-25 DIAGNOSIS — I10 Essential (primary) hypertension: Secondary | ICD-10-CM

## 2019-02-25 DIAGNOSIS — I6523 Occlusion and stenosis of bilateral carotid arteries: Secondary | ICD-10-CM

## 2019-02-25 DIAGNOSIS — F17200 Nicotine dependence, unspecified, uncomplicated: Secondary | ICD-10-CM | POA: Diagnosis not present

## 2019-02-25 NOTE — Assessment & Plan Note (Signed)
Notable today  No CHF symptoms  May need inc in lasix in the future Disc eating less sodium-handout given on DASH diet  She has very high sodium diet

## 2019-02-25 NOTE — Assessment & Plan Note (Signed)
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

## 2019-02-25 NOTE — Progress Notes (Signed)
Subjective:    Patient ID: Jennifer [name], female    DOB: Sep 30, 1955, 63 y.o.   MRN: [id number]  HPI Here for concerns about BP  Wt Readings from Last 3 Encounters:  02/25/19 263 lb (119.3 kg)  02/12/19 269 lb 12.8 oz (122.4 kg)  02/04/19 252 lb (114.3 kg)  down 6 lb from first of the month  Was down to 220 before her stroke -was sitting at home prolonged   41.19 kg/m   Eats all the time - constantly in the fridge (compulsive)  She eats carrots and ranch dressing Packs of oatmeal for breakfast  Chef boy r dee  Hot dogs  Going to the store today to get some vegetables to make salads    Cannot exercise due to her knee  She does have a rubber band for upper body exercise   DM- last a1c 6.4  Last glucose was 100   She is smoking- less than 1/4 pack  Lights a cig- she takes 2 puffs and then throws in water  Husband is smoking outside but smells like it when he comes in   Supposed to have knee surgery in oct  (knee replacement oct 1)  Will need clearance from [place] and neuro ? And cardiology  No chest pain  No sob or symptoms of heart failure     Flu shot today as well   Hypertension (in setting of CAD/vasc dz and CVA) BP Readings from Last 3 Encounters:  02/25/19 (!) 148/86  02/12/19 (!) 163/82  02/04/19 (!) 169/95  running higher than that at home - 150/89 Uses wrist monitor and crosses arm over body Cardiology visit in June     Taking amlodipine 10 mg  Coreg 12.5 mg  Lisinopril 40 mg  Furosemide 20 mg daily  Pulse Readings from Last 3 Encounters:  02/25/19 79  02/12/19 68  02/04/19 83  at home pulse in the 70s   Lab Results  Component Value Date   CREATININE 0.83 10/09/2018   BUN 18 10/09/2018   NA 141 10/09/2018   K 3.5 10/09/2018   CL 107 10/09/2018   CO2 26 10/09/2018   Lab Results  Component Value Date   CHOL 173 10/10/2018   HDL 37 (L) 10/10/2018   LDLCALC 108 (H) 10/10/2018   LDLDIRECT 154.2 11/04/2011   TRIG 140 10/10/2018   CHOLHDL 4.7 10/10/2018   Lab Results  Component Value Date   TSH 1.837 11/14/2017    Patient Active Problem List   Diagnosis Date Noted  . Pedal edema 02/25/2019  . Acute vertigo with vomiting and inability to stand 10/08/2018  . Type 2 diabetes mellitus ([place]) 10/08/2018  . Former smoker 10/08/2018  . Carotid artery disease ([place]) 03/15/2018  . Cerebrovascular accident (CVA) ([place]) 11/16/2017  . Low back pain 10/26/2017  . Screening mammogram, encounter for 09/18/2017  . CAD S/P percutaneous coronary angioplasty 11/04/2015  . NSTEMI (non-ST elevated myocardial infarction) ([place]) 11/03/2015  . Hypothyroidism 07/06/2010  . Hyperlipidemia LDL goal <100 08/28/2008  . Essential hypertension 11/13/2007  . 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 Diabetic peripheral neuropathy ([place]) 09/21/2006  . Obesity (BMI 30-39.9) 09/21/2006  . TOBACCO ABUSE 09/21/2006  . Adjustment disorder with mixed anxiety and depressed mood 09/21/2006  . CARPAL TUNNEL SYNDROME 09/21/2006  . COPD (chronic obstructive pulmonary disease) ([place]) 09/21/2006  . EDEMA 09/21/2006  . MIGRAINES, HX OF  09/21/2006   Past Medical History:  Diagnosis Date  . CAD (coronary artery disease)    cath 5/23 100% dist RCA lesion treated with 2 overlapping Integrity Resolute DES ( 2.25x54mm, 2.25x23mm), 60% mid RCA treated medically, 99% OM2 not amenable to PCI, 70% D1 lesion, EF normal  . Hypercholesteremia   . Hypertension   . Thyroid disease   . Type 2 diabetes mellitus ([place]) 10/08/2018   Past Surgical History:  Procedure Laterality Date  . ABDOMINAL HYSTERECTOMY    . CARDIAC CATHETERIZATION N/A 11/03/2015   Procedure: Left Heart Cath and Coronary Angiography;  Surgeon: Leonie Man, MD;  Location: [facility];  Service: Cardiovascular;  Laterality: N/A;  . CARDIAC CATHETERIZATION N/A 11/03/2015   Procedure: Coronary  Stent Intervention;  Surgeon: Leonie Man, MD;  Location: [facility];  Service: Cardiovascular;  Laterality: N/A;  . CARPAL TUNNEL RELEASE     Social History   Tobacco Use  . Smoking status: Current Every Day Smoker    Packs/day: 0.25    Types: Cigarettes    Last attempt to quit: 10/07/2018    Years since quitting: 0.3  . Smokeless tobacco: Never Used  Substance Use Topics  . Alcohol use: No    Alcohol/week: 0.0 standard drinks  . Drug use: No   Family History  Problem Relation Age of Onset  . Heart disease Mother   . Stroke Sister   . Heart attack Brother    No Known Allergies Current Outpatient Medications on File Prior to Visit  Medication Sig Dispense Refill  . aspirin EC 81 MG tablet Take 81 mg by mouth 2 (two) times daily.    Marland Kitchen atorvastatin (LIPITOR) 80 MG tablet Take 1 tablet (80 mg total) by mouth daily at 6 PM. (Patient taking differently: Take 80 mg by mouth every morning. ) 90 tablet 2  . buPROPion (WELLBUTRIN SR) 150 MG 12 hr tablet Take 1 tablet by mouth twice daily 180 tablet 1  . DULoxetine (CYMBALTA) 60 MG capsule Take 1 capsule by mouth once daily 90 capsule 30  . Ezetimibe (ZETIA PO) Take by mouth.    . furosemide (LASIX) 20 MG tablet Take 1 tablet (20 mg total) by mouth daily. 90 tablet 2  . HYDROcodone-acetaminophen (NORCO/VICODIN) 5-325 MG tablet One tablet by mouth every six hours as needed for pain. Must last 30 days 60 tablet 0  . insulin lispro (HUMALOG) 100 UNIT/ML injection Inject 1-40 Units into the skin continuous. Max bolus dose is 40 units Via pump    . levothyroxine (SYNTHROID) 137 MCG tablet Take 137 mcg by mouth daily before breakfast.    . lisinopril (PRINIVIL,ZESTRIL) 40 MG tablet Take 1 tablet (40 mg total) by mouth daily. 90 tablet 3  . meclizine (ANTIVERT) 25 MG tablet Take 1 tablet (25 mg total) by mouth 3 (three) times daily as needed for dizziness. 30 tablet 0  . nitroGLYCERIN (NITROSTAT) 0.4 MG SL tablet Place 1 tablet (0.4 mg  total) under the tongue every 5 (five) minutes x 3 doses as needed for chest pain. 25 tablet 3  . sitaGLIPtin (JANUVIA) 100 MG tablet Take 100 mg by mouth daily.     Marland Kitchen amLODipine (NORVASC) 10 MG tablet Take 1 tablet (10 mg total) by mouth daily for 30 days. 30 tablet 5   No current facility-administered medications on file prior to visit.     Review of Systems  Constitutional: Negative for activity change, appetite change, fatigue, fever and unexpected  weight change.       Eating out of control at times   HENT: Negative for congestion, ear pain, rhinorrhea, sinus pressure and sore throat.   Eyes: Negative for pain, redness and visual disturbance.  Respiratory: Negative for cough, shortness of breath and wheezing.   Cardiovascular: Positive for leg swelling. Negative for chest pain and palpitations.       Ankle swelling (goes down with elevation)  Gastrointestinal: Negative for abdominal pain, blood in stool, constipation and diarrhea.  Endocrine: Negative for polydipsia and polyuria.  Genitourinary: Negative for dysuria, frequency and urgency.  Musculoskeletal: Positive for arthralgias. Negative for back pain and myalgias.  Skin: Negative for pallor and rash.  Allergic/Immunologic: Negative for environmental allergies.  Neurological: Negative for dizziness, syncope and headaches.  Hematological: Negative for adenopathy. Does not bruise/bleed easily.  Psychiatric/Behavioral: Positive for dysphoric mood. Negative for decreased concentration. The patient is not nervous/anxious.        Unmotivated        Objective:   Physical Exam Constitutional:      General: She is not in acute distress.    Appearance: Normal appearance. She is well-developed. She is obese. She is not ill-appearing.  HENT:     Head: Normocephalic and atraumatic.     Mouth/Throat:     Mouth: Mucous membranes are moist.  Eyes:     General: No scleral icterus.    Conjunctiva/sclera: Conjunctivae normal.     Pupils:  Pupils are equal, round, and reactive to light.  Neck:     Musculoskeletal: Normal range of motion and neck supple. No neck rigidity or muscular tenderness.     Thyroid: No thyromegaly.     Vascular: No JVD.  Cardiovascular:     Rate and Rhythm: Normal rate and regular rhythm.     Heart sounds: Normal heart sounds. No gallop.   Pulmonary:     Effort: Pulmonary effort is normal. No respiratory distress.     Breath sounds: Normal breath sounds. No wheezing or rales.     Comments: Good air exch No wheeze Abdominal:     General: Bowel sounds are normal. There is no distension or abdominal bruit.     Palpations: Abdomen is soft. There is no mass.     Tenderness: There is no abdominal tenderness.     Hernia: No hernia is present.  Musculoskeletal:     Right lower leg: Edema present.     Comments: One plus pitting edema in both ankles  Lymphadenopathy:     Cervical: No cervical adenopathy.  Skin:    General: Skin is warm and dry.     Findings: No rash.  Neurological:     Mental Status: She is alert.     Cranial Nerves: No cranial nerve deficit.     Sensory: No sensory deficit.     Coordination: Coordination normal.     Deep Tendon Reflexes: Reflexes are normal and symmetric. Reflexes normal.  Psychiatric:        Mood and Affect: Mood normal. Affect is blunt.           Assessment & Plan:   Problem List Items Addressed This Visit      Cardiovascular and Mediastinum   Essential hypertension - Primary (Chronic)    BP: (!) 148/86    Still elevated She is only taking coreg qd (should be bid)-mistake on her part Will inc to bid Continue to check bp at home when relaxed  If no imp -may consider inc  lasix (pedal edema)  Will update       Carotid artery disease (HCC)    Watched by cardiology and neurology  H/o CVA         Other   TOBACCO ABUSE (Chronic)    Disc in detail risks of smoking and possible outcomes including copd, vascular/ heart disease, cancer ,  respiratory and sinus infections  Pt voices understanding Needs to entirely quit before knee surgery-pt aware She has already had MI and CVA      Morbid obesity ([place])    Discussed how this problem influences overall health and the risks it imposes  Reviewed plan for weight loss with lower calorie diet (via better food choices and also portion control or program like weight watchers) and exercise building up to or more than 30 minutes 5 days per week including some aerobic activity   Plans to try upper body exercise  Waiting for a knee replacement       Pedal edema    Notable today  No CHF symptoms  May need inc in lasix in the future Disc eating less sodium-handout given on DASH diet  She has very high sodium diet        Other Visit Diagnoses    Need for influenza vaccination       Relevant Orders   Flu Vaccine QUAD 6+ mos PF IM (Fluarix Quad PF) (Completed)

## 2019-02-25 NOTE — Assessment & Plan Note (Signed)
Discussed how this problem influences overall health and the risks it imposes  Reviewed plan for weight loss with lower calorie diet (via better food choices and also portion control or program like weight watchers) and exercise building up to or more than 30 minutes 5 days per week including some aerobic activity   Plans to try upper body exercise  Waiting for a knee replacement

## 2019-02-25 NOTE — Assessment & Plan Note (Signed)
Watched by cardiology and neurology  H/o CVA

## 2019-02-25 NOTE — Assessment & Plan Note (Signed)
BP: (!) 148/86    Still elevated She is only taking coreg qd (should be bid)-mistake on her part Will inc to bid Continue to check bp at home when relaxed  If no imp -may consider inc lasix (pedal edema)  Will update

## 2019-02-25 NOTE — Patient Instructions (Addendum)
Change the carvedilol to twice daily  Try to eat less sodium and processed foods - hand out DASH diet   Try to get most of your carbohydrates from produce (with the exception of white potatoes)  Eat less bread/pasta/rice/snack foods/cereals/sweets and other items from the middle of the grocery store (processed carbs)   Do chair upper body exercise - video/use bands etc   You to need to totally quit smoking before surgery   You will need good blood pressure control and clearance from cardiology as well   Later this week call us with blood pressure readings   We may want to increase diuretic in the future as well   Flu shot today

## 2019-02-27 ENCOUNTER — Telehealth: Payer: Self-pay

## 2019-02-27 ENCOUNTER — Ambulatory Visit (INDEPENDENT_AMBULATORY_CARE_PROVIDER_SITE_OTHER): Payer: 59 | Admitting: Orthopedic Surgery

## 2019-02-27 ENCOUNTER — Other Ambulatory Visit: Payer: Self-pay

## 2019-02-27 ENCOUNTER — Encounter: Payer: Self-pay | Admitting: Orthopedic Surgery

## 2019-02-27 ENCOUNTER — Ambulatory Visit: Payer: 59

## 2019-02-27 VITALS — BP 156/66 | HR 75 | Ht 67.0 in | Wt 244.0 lb

## 2019-02-27 DIAGNOSIS — M1710 Unilateral primary osteoarthritis, unspecified knee: Secondary | ICD-10-CM

## 2019-02-27 DIAGNOSIS — M1711 Unilateral primary osteoarthritis, right knee: Secondary | ICD-10-CM

## 2019-02-27 DIAGNOSIS — M25561 Pain in right knee: Secondary | ICD-10-CM | POA: Diagnosis not present

## 2019-02-27 NOTE — Progress Notes (Signed)
PREOP   Chief Complaint  Patient presents with  . Knee Pain    bilateral     63 year old female had a stroke in April she is waited the required 6 months had a neurology consult for clearance blood pressure is been handled by Dr. Glori Bickers.  She has diabetes hypothyroidism hypertension is on insulin currently on aspirin twice a day 81 mg which does not need to be held  Presents with chronic knee pain severe quality dull sometimes throbbing her activities of daily living have been affected by this with more difficulty getting in and out of chairs climbing stairs.  She did have to use a cane for ambulation  Past Medical History:  Diagnosis Date  . CAD (coronary artery disease)    cath 5/23 100% dist RCA lesion treated with 2 overlapping Integrity Resolute DES ( 2.25x67mm, 2.25x11mm), 60% mid RCA treated medically, 99% OM2 not amenable to PCI, 70% D1 lesion, EF normal  . Hypercholesteremia   . Hypertension   . Thyroid disease   . Type 2 diabetes mellitus ([place]) 10/08/2018    Past Surgical History:  Procedure Laterality Date  . ABDOMINAL HYSTERECTOMY    . CARDIAC CATHETERIZATION N/A 11/03/2015   Procedure: Left Heart Cath and Coronary Angiography;  Surgeon: Leonie Man, MD;  Location: [facility];  Service: Cardiovascular;  Laterality: N/A;  . CARDIAC CATHETERIZATION N/A 11/03/2015   Procedure: Coronary Stent Intervention;  Surgeon: Leonie Man, MD;  Location: [facility];  Service: Cardiovascular;  Laterality: N/A;  . CARPAL TUNNEL RELEASE      Social History   Tobacco Use  . Smoking status: Former Smoker    Packs/day: 0.25    Types: Cigarettes    Quit date: 10/07/2018    Years since quitting: 0.3  . Smokeless tobacco: Never Used  Substance Use Topics  . Alcohol use: No    Alcohol/week: 0.0 standard drinks  . Drug use: No     Current Outpatient Medications:  .  amLODipine (NORVASC) 10 MG tablet, Take 1 tablet (10 mg total) by mouth daily for 30 days., Disp:  30 tablet, Rfl: 5 .  aspirin EC 81 MG tablet, Take 81 mg by mouth 2 (two) times daily., Disp: , Rfl:  .  atorvastatin (LIPITOR) 80 MG tablet, Take 1 tablet (80 mg total) by mouth daily at 6 PM. (Patient taking differently: Take 80 mg by mouth every morning. ), Disp: 90 tablet, Rfl: 2 .  buPROPion (WELLBUTRIN SR) 150 MG 12 hr tablet, Take 1 tablet by mouth twice daily, Disp: 180 tablet, Rfl: 1 .  carvedilol (COREG) 12.5 MG tablet, Take 1 tablet (12.5 mg total) by mouth daily., Disp: 90 tablet, Rfl: 0 .  DULoxetine (CYMBALTA) 60 MG capsule, Take 1 capsule by mouth once daily, Disp: 90 capsule, Rfl: 30 .  Ezetimibe (ZETIA PO), Take by mouth., Disp: , Rfl:  .  furosemide (LASIX) 20 MG tablet, Take 1 tablet (20 mg total) by mouth daily., Disp: 90 tablet, Rfl: 2 .  HYDROcodone-acetaminophen (NORCO/VICODIN) 5-325 MG tablet, One tablet by mouth every six hours as needed for pain. Must last 30 days, Disp: 60 tablet, Rfl: 0 .  insulin lispro (HUMALOG) 100 UNIT/ML injection, Inject 1-40 Units into the skin continuous. Max bolus dose is 40 units Via pump, Disp: , Rfl:  .  levothyroxine (SYNTHROID) 137 MCG tablet, Take 137 mcg by mouth daily before breakfast., Disp: , Rfl:  .  lisinopril (PRINIVIL,ZESTRIL) 40 MG tablet, Take 1  tablet (40 mg total) by mouth daily., Disp: 90 tablet, Rfl: 3 .  meclizine (ANTIVERT) 25 MG tablet, Take 1 tablet (25 mg total) by mouth 3 (three) times daily as needed for dizziness., Disp: 30 tablet, Rfl: 0 .  nitroGLYCERIN (NITROSTAT) 0.4 MG SL tablet, Place 1 tablet (0.4 mg total) under the tongue every 5 (five) minutes x 3 doses as needed for chest pain., Disp: 25 tablet, Rfl: 3 .  sitaGLIPtin (JANUVIA) 100 MG tablet, Take 100 mg by mouth daily. , Disp: , Rfl:   Family History  Problem Relation Age of Onset  . Heart disease Mother   . Stroke Sister   . Heart attack Brother      BP (!) 156/66   Pulse 75   Ht 5\' 7"  (1.702 m)   Wt 244 lb (110.7 kg)   BMI 38.22 kg/m   Peripheral vascular system no major varicose veins or swelling pulses are normal temperature normal no significant edema no tenderness in the lower extremities  Lymph nodes in the axilla and groin are normal  Gait is remarkable for a cane in the right hand with a slow gait decreased stride length slow cadence and valgus position of the knee  Her finger-nose testing is good her reflexes are normal she has good sensation she is alert and oriented x3 mood and affect are normal  Her skin over the right knee looks clean she hyperextends the knee actually and has 115 degrees of flexion she stable in all planes a lateral joint line is tender she has excellent muscle strength and tone  Upper extremities seem strong with good grip strength normal range of motion no instability normal muscle tone no tenderness normal skin   Prior films show valgus knee mild valgus deformity of severe arthritis lateral compartment secondary bone changes are seen  AP lateral patella shows adequate length patellofemoral replacement with arthritic changes noted  The procedure has been fully reviewed with the patient; The risks and benefits of surgery have been discussed and explained and understood. Alternative treatment has also been reviewed, questions were encouraged and answered. The postoperative plan is also been reviewed.  Patient wants to proceed with a right total knee

## 2019-02-27 NOTE — Telephone Encounter (Signed)
Pt notified of Dr. Marliss Coots comments. Pt asked me is she cleared for surgery. I did see per last OV DR. [name] said she has to be cleared from cardiologist too but she wanted to know if Dr. Glori Bickers cleared her for surgery now since her surgery is scheduled on 03/14/19, pt said she will also check with cardiologist too

## 2019-02-27 NOTE — Telephone Encounter (Signed)
Better!  Continue to take the coreg bid (that made the difference)  Let me know if any further problems

## 2019-02-27 NOTE — Telephone Encounter (Signed)
She is cleared medically from me  Needs cardiac clearance if she has not gotten it

## 2019-02-27 NOTE — Telephone Encounter (Signed)
Pt left v/m; pt was seen 02/25/19 and was to cb with BP readings @ home.02/26/19 in afternoon BP 132/77; later on 02/26/19 BP 135/75; on 02/27/19 this morning BP was 130/82. Pt request cb if needed.

## 2019-02-27 NOTE — Telephone Encounter (Signed)
Left VM letting pt know Dr. [name]'s comments  

## 2019-02-27 NOTE — Patient Instructions (Signed)
You have decided to proceed with knee replacement surgery. You have decided not to continue with nonoperative measures such as but not limited to oral medication, weight loss, activity modification, physical therapy, bracing, or injection.  We will perform the procedure commonly known as total knee replacement. Some of the risks associated with knee replacement surgery include but are not limited to Bleeding Infection Swelling Stiffness Blood clot Pulmonary embolism  Loosening of the implant Pain that persists even after surgery  Infection is especially devastating complication of knee surgery although rare. If infection does occur your implant will usually have to be removed and several surgeries and antibiotics will be needed to eradicate the infection prior to performing a repeat replacement.   In some cases amputation is required to eradicate the infection. In other rare cases a knee fusion is needed   In compliance with recent [place] law in federal regulation regarding opioid use and abuse and addiction, we will taper (stop) opioid medication after 2 weeks.  If you're not comfortable with these risks and would like to continue with nonoperative treatment please let Dr. Zeffie Bickert know prior to your surgery.  

## 2019-02-27 NOTE — Addendum Note (Signed)
Addended byCandice Camp on: 02/27/2019 05:01 PM   Modules accepted: Orders, SmartSet

## 2019-03-20 ENCOUNTER — Telehealth: Payer: Self-pay | Admitting: Cardiovascular Disease

## 2019-03-20 NOTE — Telephone Encounter (Signed)
Please call pts daughter [name] @ [phone] concerning the pt's carvedilol (COREG) 12.5 MG tablet [id number]   Last time she had an apt she was told to take 2 a day and the Rx says take 1

## 2019-03-21 MED ORDER — CARVEDILOL 12.5 MG PO TABS: 12.5000 mg | ORAL_TABLET | Freq: Two times a day (BID) | ORAL | 3 refills | Status: DC

## 2019-03-21 NOTE — Telephone Encounter (Signed)
refilled coreg 12.5 mg bid as ordered, daughter aware

## 2019-03-27 ENCOUNTER — Other Ambulatory Visit: Payer: Self-pay | Admitting: Orthopedic Surgery

## 2019-03-27 DIAGNOSIS — M1711 Unilateral primary osteoarthritis, right knee: Secondary | ICD-10-CM

## 2019-03-27 NOTE — Patient Instructions (Addendum)
Jennifer [name]  03/27/2019     @PREFPERIOPPHARMACY @   Your procedure is scheduled on  04/02/2019 .  Report to [facility] at  [phone]  A.M.  Call this number if you have problems the morning of surgery:  [phone]   Remember:  Do not eat or drink after midnight.                       Take these medicines the morning of surgery with A SIP OF WATER  Amlodipine, wellbutrin, carvediolol, cymbalta, hydrocodone(if needed, levothyroxine, antivery (if needed)              Insulin pump at Basal rate am of surgery    Do not wear jewelry, make-up or nail polish.  Do not wear lotions, powders, or perfumes. Please wear deodorant and brush your teeth.  Do not shave 48 hours prior to surgery.  Men may shave face and neck.  Do not bring valuables to the hospital.  [facility] is not responsible for any belongings or valuables.  Contacts, dentures or bridgework may not be worn into surgery.  Leave your suitcase in the car.  After surgery it may be brought to your room.  For patients admitted to the hospital, discharge time will be determined by your treatment team.  Patients discharged the day of surgery will not be allowed to drive home.   Name and phone number of your driver:   family Special instructions:  None  Please read over the following fact sheets that you were given. Pain Booklet, Coughing and Deep Breathing, Blood Transfusion Information, Total Joint Packet, MRSA Information, Surgical Site Infection Prevention, Anesthesia Post-op Instructions and Care and Recovery After Surgery       Total Knee Replacement, Care After This sheet gives you information about how to care for yourself after your procedure. Your health care provider may also give you more specific instructions. If you have problems or questions, contact your health care provider. What can I expect after the procedure? After the procedure, it is common to have:  Pain.  Swelling.  A small amount  of blood or clear fluid coming from your incision.  Limited range of motion. Follow these instructions at home: Medicines  Take over-the-counter and prescription medicines only as told by your health care provider.  If you were prescribed a blood thinner (anticoagulant), take it as told by your health care provider.  Ask your health care provider if the medicine prescribed to you: ? Requires you to avoid driving or using heavy machinery. ? Can cause constipation. You may need to take actions to prevent or treat constipation, such as:  Drink enough fluid to keep your urine pale yellow.  Take over-the-counter or prescription medicines.  Eat foods that are high in fiber, such as beans, whole grains, and fresh fruits and vegetables.  Limit foods that are high in fat and processed sugars, such as fried or sweet foods. Bathing  Do not take baths, swim, or use a hot tub until your health care provider approves. Ask your health care provider if you may take showers. You may only be allowed to take sponge baths.  Keep your bandage (dressing) dry until your health care provider says it can be removed. Incision care and drain care   Follow instructions from your health care provider about how to take care of your incision. Make sure you: ? Wash your hands  with soap and water before and after you change your dressing. If soap and water are not available, use hand sanitizer. ? Change your dressing as told by your health care provider. ? Leave stitches (sutures), skin glue, or adhesive strips in place. These skin closures may need to stay in place for 2 weeks or longer. If adhesive strip edges start to loosen and curl up, you may trim the loose edges. Do not remove adhesive strips completely unless your health care provider tells you to do that.  Check your incision area and drain site every day for signs of infection. Check for: ? More redness, swelling, or pain. ? More fluid or  blood. ? Warmth. ? Pus or a bad smell.  If you have a drain, follow instructions from your health care provider about caring for it. Managing pain, stiffness, and swelling      If directed, put ice on your knee. ? Put ice in a plastic bag or use the icing device (cold flow pad or cryocuff) that you were given. Follow instructions from your health care provider about how to use the icing device. ? Place a towel between your skin and the bag or between your skin and the icing device. ? Leave the ice on for 20 minutes, 2-3 times per day.  If directed, apply heat to the affected area before you exercise. Use the heat source that your health care provider recommends, such as a moist heat pack or a heating pad. ? Place a towel between your skin and the heat source. ? Leave the heat on for 20-30 minutes. ? Remove the heat if your skin turns bright red. This is especially important if you are unable to feel pain, heat, or cold. You may have a greater risk of getting burned.  Move your toes often to avoid stiffness and to lessen swelling.  Raise (elevate) your leg above the level of your heart while you are sitting or lying down. ? Use several pillows to keep your leg straight. ? Do not put a pillow just under the knee. If the knee is bent for a long time, this may lead to stiffness.  Wear elastic knee support as told by your health care provider. Activity  Rest as told by your health care provider.  Avoid sitting for a long time without moving. Get up to take short walks every 1-2 hours. This is important to improve blood flow and breathing. Ask for help if you feel weak or unsteady.  Ask your health care provider what activities are safe for you.  Avoid high-impact activities, including running, jumping rope, and jumping jacks.  Do not play contact sports until your health care provider approves.  Do exercises as told by your physical therapist.  If you have been sent home with a  continuous passive motion machine, use it as told by your health care provider. Safety   Do not use your leg to support your body weight until your health care provider approves. Use crutches or a walker as told by your health care provider.  Do not drive until your health care provider approves. Ask your health care provider when it is safe to drive. General instructions  Do not use any products that contain nicotine or tobacco, such as cigarettes, e-cigarettes, and chewing tobacco. These can delay healing after surgery. If you need help quitting, ask your health care provider.  Wear compression stockings as told by your health care provider.  Tell your health care  provider if you plan to have dental work. Also, tell your dentist about your joint replacement.  Keep all follow-up visits as told by your health care provider. This is important. Contact a health care provider if you have:  More redness, swelling, or pain around your incision or drain.  More fluid or blood coming from your incision or drain.  Pus or a bad smell coming from your incision or drain.  Warmth on your incision or drain site.  A fever.  An incision that breaks open.  Knee pain that does not go away.  Range of motion in your knee that is getting worse.  A prosthesis that feels loose. Get help right away if you have:  Pain or swelling in your calf or thigh.  Shortness of breath or difficulty breathing.  Chest pain. Summary  After the procedure, it is common to have pain and swelling, blood or fluid coming from your incision, and limited range of motion.  Follow instructions from your health care provider about how to take care of your incision.  Use crutches or a walker as told by your health care provider.  If you were prescribed a blood thinner (anticoagulant), take it as told by your health care provider.  Keep all follow-up visits as told by your health care provider. This is  important. This information is not intended to replace advice given to you by your health care provider. Make sure you discuss any questions you have with your health care provider. Document Released: 12/17/2004 Document Revised: 06/22/2018 Document Reviewed: 01/11/2018 Elsevier Interactive Patient Education  [place].  Spinal Anesthesia and Epidural Anesthesia, Care After This sheet gives you information about how to care for yourself after your procedure. Your doctor may also give you more specific instructions. If you have problems or questions, call your doctor. Follow these instructions at home: For at least 24 hours after the procedure:   Have a responsible adult stay with you. It is important to have someone help care for you until you are awake and alert.  Rest as needed.  Do not do activities where you could fall or get hurt (injured).  Do not drive.  Do not use heavy machinery.  Do not drink alcohol.  Do not take sleeping pills or medicines that make you sleepy (drowsy).  Do not make important decisions.  Do not sign legal documents.  Do not take care of children on your own. Eating and drinking  If you throw up (vomit), drink water, juice, or soup when nausea and vomiting stop.  Drink enough fluid to keep your pee (urine) pale yellow.  Make sure you do not feel like throwing up (nauseous) before you eat solid foods.  Follow the diet that your doctor recommends. General instructions  Return to your normal activities as told by your doctor. Ask your doctor what activities are safe for you.  Take over-the-counter and prescription medicines only as told by your doctor.  If you have sleep apnea, surgery and certain medicines can raise your risk for breathing problems. Follow instructions from your doctor about when to wear your sleep device. Your doctor may tell you to wear your sleep device: ? Anytime you are sleeping, including during daytime  naps. ? While taking prescription pain medicines, sleeping pills, or medicines that make you sleepy.  Do not use any products that contain nicotine or tobacco. This includes cigarettes and e-cigarettes. ? If you need help quitting, ask your doctor. ? If you smoke,  do not smoke by yourself. Make sure someone is nearby in case you need help.  Keep all follow-up visits as told by your doctor. This is important. Contact a doctor if:  It has been more than one day since your procedure and you feel like throwing up.  It has been more than one day since your procedure and you throw up.  You have a rash. Get help right away if:  You have a fever.  You have a headache that lasts a long time.  You have a very bad headache.  Your vision is blurry.  You see two of a single object (double vision).  You are dizzy or light-headed.  You faint.  Your arms or legs tingle, feel weak, or get numb.  You have trouble breathing.  You cannot pee (urinate). Summary  After the procedure, have a responsible adult stay with you at home until you are fully awake and alert.  Do not do activities that might get you injured. Do not drive, use heavy machinery, drink alcohol, or make important decisions for 24 hours after the procedure.  Take medicines as told by your doctor. Do not use products that contain nicotine or tobacco.  Get help right away if you have a fever, blurry vision, difficulty breathing or passing urine, or weakness or numbness in arms or legs. This information is not intended to replace advice given to you by your health care provider. Make sure you discuss any questions you have with your health care provider. Document Released: 09/21/2015 Document Revised: 05/12/2017 Document Reviewed: 09/21/2015 Elsevier Patient Education  2020 [place] Anesthesia, Adult, Care After This sheet gives you information about how to care for yourself after your procedure. Your health  care provider may also give you more specific instructions. If you have problems or questions, contact your health care provider. What can I expect after the procedure? After the procedure, the following side effects are common:  Pain or discomfort at the IV site.  Nausea.  Vomiting.  Sore throat.  Trouble concentrating.  Feeling cold or chills.  Weak or tired.  Sleepiness and fatigue.  Soreness and body aches. These side effects can affect parts of the body that were not involved in surgery. Follow these instructions at home:  For at least 24 hours after the procedure:  Have a responsible adult stay with you. It is important to have someone help care for you until you are awake and alert.  Rest as needed.  Do not: ? Participate in activities in which you could fall or become injured. ? Drive. ? Use heavy machinery. ? Drink alcohol. ? Take sleeping pills or medicines that cause drowsiness. ? Make important decisions or sign legal documents. ? Take care of children on your own. Eating and drinking  Follow any instructions from your health care provider about eating or drinking restrictions.  When you feel hungry, start by eating small amounts of foods that are soft and easy to digest (bland), such as toast. Gradually return to your regular diet.  Drink enough fluid to keep your urine pale yellow.  If you vomit, rehydrate by drinking water, juice, or clear broth. General instructions  If you have sleep apnea, surgery and certain medicines can increase your risk for breathing problems. Follow instructions from your health care provider about wearing your sleep device: ? Anytime you are sleeping, including during daytime naps. ? While taking prescription pain medicines, sleeping medicines, or medicines that make you drowsy.  Return to your normal activities as told by your health care provider. Ask your health care provider what activities are safe for you.  Take  over-the-counter and prescription medicines only as told by your health care provider.  If you smoke, do not smoke without supervision.  Keep all follow-up visits as told by your health care provider. This is important. Contact a health care provider if:  You have nausea or vomiting that does not get better with medicine.  You cannot eat or drink without vomiting.  You have pain that does not get better with medicine.  You are unable to pass urine.  You develop a skin rash.  You have a fever.  You have redness around your IV site that gets worse. Get help right away if:  You have difficulty breathing.  You have chest pain.  You have blood in your urine or stool, or you vomit blood. Summary  After the procedure, it is common to have a sore throat or nausea. It is also common to feel tired.  Have a responsible adult stay with you for the first 24 hours after general anesthesia. It is important to have someone help care for you until you are awake and alert.  When you feel hungry, start by eating small amounts of foods that are soft and easy to digest (bland), such as toast. Gradually return to your regular diet.  Drink enough fluid to keep your urine pale yellow.  Return to your normal activities as told by your health care provider. Ask your health care provider what activities are safe for you. This information is not intended to replace advice given to you by your health care provider. Make sure you discuss any questions you have with your health care provider. Document Released: 09/05/2000 Document Revised: 06/02/2017 Document Reviewed: 01/13/2017 Elsevier Patient Education  2020 [employer]. How to Use Chlorhexidine for Bathing Chlorhexidine gluconate (CHG) is a germ-killing (antiseptic) solution that is used to clean the skin. It can get rid of the bacteria that normally live on the skin and can keep them away for about 24 hours. To clean your skin with CHG, you may be  given:  A CHG solution to use in the shower or as part of a sponge bath.  A prepackaged cloth that contains CHG. Cleaning your skin with CHG may help lower the risk for infection:  While you are staying in the intensive care unit of the hospital.  If you have a vascular access, such as a central line, to provide short-term or long-term access to your veins.  If you have a catheter to drain urine from your bladder.  If you are on a ventilator. A ventilator is a machine that helps you breathe by moving air in and out of your lungs.  After surgery. What are the risks? Risks of using CHG include:  A skin reaction.  Hearing loss, if CHG gets in your ears.  Eye injury, if CHG gets in your eyes and is not rinsed out.  The CHG product catching fire. Make sure that you avoid smoking and flames after applying CHG to your skin. Do not use CHG:  If you have a chlorhexidine allergy or have previously reacted to chlorhexidine.  On babies younger than 68 months of age. How to use CHG solution  Use CHG only as told by your health care provider, and follow the instructions on the label.  Use the full amount of CHG as directed. Usually, this is one  bottle. During a shower Follow these steps when using CHG solution during a shower (unless your health care provider gives you different instructions): 1. Start the shower. 2. Use your normal soap and shampoo to wash your face and hair. 3. Turn off the shower or move out of the shower stream. 4. Pour the CHG onto a clean washcloth. Do not use any type of brush or rough-edged sponge. 5. Starting at your neck, lather your body down to your toes. Make sure you follow these instructions: ? If you will be having surgery, pay special attention to the part of your body where you will be having surgery. Scrub this area for at least 1 minute. ? Do not use CHG on your head or face. If the solution gets into your ears or eyes, rinse them well with  water. ? Avoid your genital area. ? Avoid any areas of skin that have broken skin, cuts, or scrapes. ? Scrub your back and under your arms. Make sure to wash skin folds. 6. Let the lather sit on your skin for 1-2 minutes or as long as told by your health care provider. 7. Thoroughly rinse your entire body in the shower. Make sure that all body creases and crevices are rinsed well. 8. Dry off with a clean towel. Do not put any substances on your body afterward--such as powder, lotion, or perfume--unless you are told to do so by your health care provider. Only use lotions that are recommended by the manufacturer. 9. Put on clean clothes or pajamas. 10. If it is the night before your surgery, sleep in clean sheets.  During a sponge bath Follow these steps when using CHG solution during a sponge bath (unless your health care provider gives you different instructions): 1. Use your normal soap and shampoo to wash your face and hair. 2. Pour the CHG onto a clean washcloth. 3. Starting at your neck, lather your body down to your toes. Make sure you follow these instructions: ? If you will be having surgery, pay special attention to the part of your body where you will be having surgery. Scrub this area for at least 1 minute. ? Do not use CHG on your head or face. If the solution gets into your ears or eyes, rinse them well with water. ? Avoid your genital area. ? Avoid any areas of skin that have broken skin, cuts, or scrapes. ? Scrub your back and under your arms. Make sure to wash skin folds. 4. Let the lather sit on your skin for 1-2 minutes or as long as told by your health care provider. 5. Using a different clean, wet washcloth, thoroughly rinse your entire body. Make sure that all body creases and crevices are rinsed well. 6. Dry off with a clean towel. Do not put any substances on your body afterward--such as powder, lotion, or perfume--unless you are told to do so by your health care provider.  Only use lotions that are recommended by the manufacturer. 7. Put on clean clothes or pajamas. 8. If it is the night before your surgery, sleep in clean sheets. How to use CHG prepackaged cloths  Only use CHG cloths as told by your health care provider, and follow the instructions on the label.  Use the CHG cloth on clean, dry skin.  Do not use the CHG cloth on your head or face unless your health care provider tells you to.  When washing with the CHG cloth: ? Avoid your genital area. ?  Avoid any areas of skin that have broken skin, cuts, or scrapes. Before surgery Follow these steps when using a CHG cloth to clean before surgery (unless your health care provider gives you different instructions): 1. Using the CHG cloth, vigorously scrub the part of your body where you will be having surgery. Scrub using a back-and-forth motion for 3 minutes. The area on your body should be completely wet with CHG when you are done scrubbing. 2. Do not rinse. Discard the cloth and let the area air-dry. Do not put any substances on the area afterward, such as powder, lotion, or perfume. 3. Put on clean clothes or pajamas. 4. If it is the night before your surgery, sleep in clean sheets.  For general bathing Follow these steps when using CHG cloths for general bathing (unless your health care provider gives you different instructions). 1. Use a separate CHG cloth for each area of your body. Make sure you wash between any folds of skin and between your fingers and toes. Wash your body in the following order, switching to a new cloth after each step: ? The front of your neck, shoulders, and chest. ? Both of your arms, under your arms, and your hands. ? Your stomach and groin area, avoiding the genitals. ? Your right leg and foot. ? Your left leg and foot. ? The back of your neck, your back, and your buttocks. 2. Do not rinse. Discard the cloth and let the area air-dry. Do not put any substances on your body  afterward--such as powder, lotion, or perfume--unless you are told to do so by your health care provider. Only use lotions that are recommended by the manufacturer. 3. Put on clean clothes or pajamas. Contact a health care provider if:  Your skin gets irritated after scrubbing.  You have questions about using your solution or cloth. Get help right away if:  Your eyes become very red or swollen.  Your eyes itch badly.  Your skin itches badly and is red or swollen.  Your hearing changes.  You have trouble seeing.  You have swelling or tingling in your mouth or throat.  You have trouble breathing.  You swallow any chlorhexidine. Summary  Chlorhexidine gluconate (CHG) is a germ-killing (antiseptic) solution that is used to clean the skin. Cleaning your skin with CHG may help to lower your risk for infection.  You may be given CHG to use for bathing. It may be in a bottle or in a prepackaged cloth to use on your skin. Carefully follow your health care provider's instructions and the instructions on the product label.  Do not use CHG if you have a chlorhexidine allergy.  Contact your health care provider if your skin gets irritated after scrubbing. This information is not intended to replace advice given to you by your health care provider. Make sure you discuss any questions you have with your health care provider. Document Released: 02/22/2012 Document Revised: 08/16/2018 Document Reviewed: 04/27/2017 Elsevier Patient Education  2020 [employer].

## 2019-03-29 ENCOUNTER — Encounter (HOSPITAL_COMMUNITY)
Admission: RE | Admit: 2019-03-29 | Discharge: 2019-03-29 | Disposition: A | Discharge status: Home / Self Care | Payer: 59 | Source: Ambulatory Visit | Attending: Orthopedic Surgery | Admitting: Orthopedic Surgery

## 2019-03-29 ENCOUNTER — Other Ambulatory Visit (HOSPITAL_COMMUNITY)
Admission: RE | Admit: 2019-03-29 | Discharge: 2019-03-29 | Disposition: A | Discharge status: Home / Self Care | Payer: 59 | Source: Ambulatory Visit | Attending: Orthopedic Surgery | Admitting: Orthopedic Surgery

## 2019-03-29 ENCOUNTER — Encounter (HOSPITAL_COMMUNITY): Payer: Self-pay

## 2019-03-29 ENCOUNTER — Other Ambulatory Visit: Payer: Self-pay

## 2019-03-29 DIAGNOSIS — Z01812 Encounter for preprocedural laboratory examination: Secondary | ICD-10-CM | POA: Diagnosis not present

## 2019-03-29 DIAGNOSIS — Z20828 Contact with and (suspected) exposure to other viral communicable diseases: Secondary | ICD-10-CM | POA: Insufficient documentation

## 2019-03-29 HISTORY — DX: Hypothyroidism, unspecified: E03.9

## 2019-03-29 LAB — CBC WITH DIFFERENTIAL/PLATELET
Abs Immature Granulocytes: 0.02 10*3/uL (ref 0.00–0.07)
Basophils Absolute: 0 10*3/uL (ref 0.0–0.1)
Basophils Relative: 1 %
Eosinophils Absolute: 0.1 10*3/uL (ref 0.0–0.5)
Eosinophils Relative: 1 %
HCT: 38.9 % (ref 36.0–46.0)
Hemoglobin: 12.1 g/dL (ref 12.0–15.0)
Immature Granulocytes: 0 %
Lymphocytes Relative: 18 %
Lymphs Abs: 1.4 10*3/uL (ref 0.7–4.0)
MCH: 27.3 pg (ref 26.0–34.0)
MCHC: 31.1 g/dL (ref 30.0–36.0)
MCV: 87.6 fL (ref 80.0–100.0)
Monocytes Absolute: 0.5 10*3/uL (ref 0.1–1.0)
Monocytes Relative: 6 %
Neutro Abs: 5.6 10*3/uL (ref 1.7–7.7)
Neutrophils Relative %: 74 %
Platelets: 227 10*3/uL (ref 150–400)
RBC: 4.44 MIL/uL (ref 3.87–5.11)
RDW: 13.5 % (ref 11.5–15.5)
WBC: 7.6 10*3/uL (ref 4.0–10.5)
nRBC: 0 % (ref 0.0–0.2)

## 2019-03-29 LAB — BASIC METABOLIC PANEL
Anion gap: 8 (ref 5–15)
BUN: 27 mg/dL — ABNORMAL HIGH (ref 8–23)
CO2: 25 mmol/L (ref 22–32)
Calcium: 8.5 mg/dL — ABNORMAL LOW (ref 8.9–10.3)
Chloride: 105 mmol/L (ref 98–111)
Creatinine, Ser: 1 mg/dL (ref 0.44–1.00)
GFR calc Af Amer: >60 mL/min (ref >60)
GFR calc non Af Amer: 60 mL/min — ABNORMAL LOW (ref >60)
Glucose, Bld: 100 mg/dL — ABNORMAL HIGH (ref 70–99)
Potassium: 4.3 mmol/L (ref 3.5–5.1)
Sodium: 138 mmol/L (ref 135–145)

## 2019-03-29 LAB — GLUCOSE, CAPILLARY: Glucose-Capillary: 92 mg/dL (ref 70–99)

## 2019-03-29 LAB — HEMOGLOBIN A1C
Hgb A1c MFr Bld: 6.5 % — ABNORMAL HIGH (ref 4.8–5.6)
Mean Plasma Glucose: 139.85 mg/dL

## 2019-03-29 LAB — PREPARE RBC (CROSSMATCH)

## 2019-03-29 LAB — SARS CORONAVIRUS 2 (TAT 6-24 HRS): SARS Coronavirus 2: NEGATIVE

## 2019-03-29 LAB — SURGICAL PCR SCREEN
MRSA, PCR: NEGATIVE
Staphylococcus aureus: NEGATIVE

## 2019-03-29 LAB — ABO/RH: ABO/RH(D): A POS

## 2019-03-29 NOTE — Progress Notes (Signed)
No appointments available at [facility] in [place] for cardiac clearance for Total Knee surgery 04/02/2019.   Patient has an appt.  [facility] 04/01/2019 at 10:45

## 2019-04-01 ENCOUNTER — Other Ambulatory Visit: Payer: Self-pay

## 2019-04-01 ENCOUNTER — Encounter: Payer: Self-pay | Admitting: Cardiovascular Disease

## 2019-04-01 ENCOUNTER — Ambulatory Visit: Payer: 59 | Admitting: Adult Health

## 2019-04-01 ENCOUNTER — Ambulatory Visit: Payer: 59 | Admitting: Cardiovascular Disease

## 2019-04-01 VITALS — BP 185/85 | HR 79 | Ht 67.0 in | Wt 267.0 lb

## 2019-04-01 DIAGNOSIS — Z01810 Encounter for preprocedural cardiovascular examination: Secondary | ICD-10-CM | POA: Diagnosis not present

## 2019-04-01 DIAGNOSIS — I10 Essential (primary) hypertension: Secondary | ICD-10-CM

## 2019-04-01 DIAGNOSIS — E785 Hyperlipidemia, unspecified: Secondary | ICD-10-CM | POA: Diagnosis not present

## 2019-04-01 DIAGNOSIS — I2510 Atherosclerotic heart disease of native coronary artery without angina pectoris: Secondary | ICD-10-CM | POA: Diagnosis not present

## 2019-04-01 DIAGNOSIS — E669 Obesity, unspecified: Secondary | ICD-10-CM

## 2019-04-01 NOTE — Pre-Procedure Instructions (Signed)
HgbA1C routed to PCP. 

## 2019-04-01 NOTE — Progress Notes (Signed)
Cardiology Office Note:   Date:  04/01/2019  NAME:  Jennifer [name]    MRN: [id number] DOB:  03-25-1956   PCP:  Abner Greenspan, MD  Cardiologist:  Kate Sable, MD  Electrophysiologist:  None   Referring MD: Abner Greenspan, MD   Chief Complaint  Patient presents with  . Pre-op Exam   History of Present Illness:   Jennifer [name] is a 63 y.o. female with a hx of NSTEMI (s/p PCI to RCA 2017), DM, HTN, CVA, HLD who is being seen today for the evaluation of preoperative assessment at the request of [name], [name], MD. History of NSTEMI in 2017 with PCI to [place]. She had a mRCA 60% (non-flow limiting), 70% D1, 55% OM3, 99% OM2 that were not amendable to PCI (diabetic small vessel disease). History of CVA. No history of HF.  She reports she is doing well today.  She reports that she has no symptoms of angina or shortness of breath.  She does have considerable lower extremity edema, that has been attributed to immobility and poor venous circulation.  She does take Lasix daily.  Her most recent echocardiogram demonstrates no evidence of congestive heart failure.  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  She reports no symptoms of angina or shortness of breath with activity.  She states she can walk all day and has no symptoms of angina or shortness of breath.  She is limited by her knee which will be replaced.  She reports she is able to climb 1-2 flights of stairs without any limitations of chest pain or shortness of breath.  The main limitation she does have are due to arthritic issues.  Past Medical History: Past Medical History:  Diagnosis Date  . CAD (coronary artery disease)    cath 5/23 100% dist RCA lesion treated with 2 overlapping Integrity Resolute DES ( 2.25x5mm, 2.25x48mm), 60% mid RCA treated medically, 99% OM2 not amenable to PCI, 70% D1 lesion, EF normal  . Hypercholesteremia   . Hypertension   . Hypothyroidism   . Stroke ([place]) 10/2018  . Thyroid  disease   . Type 2 diabetes mellitus ([place]) 10/08/2018    Past Surgical History: Past Surgical History:  Procedure Laterality Date  . ABDOMINAL HYSTERECTOMY    . CARDIAC CATHETERIZATION N/A 11/03/2015   Procedure: Left Heart Cath and Coronary Angiography;  Surgeon: Leonie Man, MD;  Location: [place] [facility];  Service: Cardiovascular;  Laterality: N/A;  . CARDIAC CATHETERIZATION N/A 11/03/2015   Procedure: Coronary Stent Intervention;  Surgeon: Leonie Man, MD;  Location: [place] [facility];  Service: Cardiovascular;  Laterality: N/A;  . CARPAL TUNNEL RELEASE    . CORONARY ANGIOPLASTY     1 stent    Current Medications: No outpatient medications have been marked as taking for the 04/01/19 encounter (Office Visit) with Geralynn Rile, MD.     Allergies:    Patient has no known allergies.   Social History: Social History   Socioeconomic History  . Marital status: Married    Spouse name: Not on file  . Number of children: Not on file  . Years of education: Not on file  . Highest education level: Not on file  Occupational History  . Not on file  Social Needs  . Financial resource strain: Not on file  . Food insecurity    Worry: Not on file    Inability: Not on file  .  Transportation needs    Medical: Not on file    Non-medical: Not on file  Tobacco Use  . Smoking status: Former Smoker    Packs/day: 0.25    Types: Cigarettes    Quit date: 10/07/2018    Years since quitting: 0.4  . Smokeless tobacco: Never Used  Substance and Sexual Activity  . Alcohol use: No    Alcohol/week: 0.0 standard drinks  . Drug use: No  . Sexual activity: Not on file  Lifestyle  . Physical activity    Days per week: Not on file    Minutes per session: Not on file  . Stress: Not on file  Relationships  . Social Herbalist on phone: Not on file    Gets together: Not on file    Attends religious service: Not on file    Active member of club or organization: Not  on file    Attends meetings of clubs or organizations: Not on file    Relationship status: Not on file  Other Topics Concern  . Not on file  Social History Narrative  . Not on file     Family History: The patient's family history includes Heart attack in her brother; Heart disease in her mother; Stroke in her sister.  ROS:   All other ROS reviewed and negative. Pertinent positives noted in the HPI.     EKGs/Labs/Other Studies Reviewed:   The following studies were personally reviewed by me today:  EKG:  EKG is ordered today.  The ekg ordered today demonstrates normal sinus rhythm, heart rate 79, no acute ischemic changes, no evidence of prior infarction, normal EKG, and was personally reviewed by me.   TTE 10/10/2018  1. The left ventricle has normal systolic function, with an ejection fraction of 55-60%. The cavity size was normal. Left ventricular diastolic Doppler parameters are consistent with impaired relaxation. No evidence of left ventricular regional wall  motion abnormalities.  2. Moderate hypokinesis of the left ventricular, basal inferior wall.  3. The right ventricle has normal systolic function. The cavity was normal. There is no increase in right ventricular wall thickness. Right ventricular systolic pressure normal with an estimated pressure of 32.0 mmHg.  4. The aortic valve is tricuspid. Mild aortic annular calcification noted.  5. The mitral valve is grossly normal. There is mild mitral annular calcification present.  6. The tricuspid valve is grossly normal.  7. The aortic root is normal in size and structure.  8. There is aneurysmal motion of the interatrial septum with evidence of PFO by agitated saline injection, right to left shunting seen intermittently.  LHC 11/04/2015  Dist RCA lesion, 100% stenosed. Post intervention - PTCA followed by PCI with 2 overlapping Integrity Resolute DES Stents (2.25 mm x 14 mm, 2.25 mm x 12 mm), there is a 0% residual stenosis.   Mid RCA lesion, 60% stenosed. Not flow-limiting. The decision was made to treat this medically.  2nd Mrg lesion, 99% stenosed. Prior to visualization of the distal RCA occlusion, this was thought to be a potential culprit, however not PCI amenable.  Lat 1st Mrg lesion, 55% stenosed.  1st Diag lesion, 70% stenosed.  Preserved LVEF with normal LVEDP  The left ventricular systolic function is normal.   Recent Labs: 10/08/2018: ALT 17 03/29/2019: BUN 27; Creatinine, Ser 1.00; Hemoglobin 12.1; Platelets 227; Potassium 4.3; Sodium 138   Recent Lipid Panel    Component Value Date/Time   CHOL 173 10/10/2018 0509   TRIG  140 10/10/2018 0509   HDL 37 (L) 10/10/2018 0509   CHOLHDL 4.7 10/10/2018 0509   VLDL 28 10/10/2018 0509   LDLCALC 108 (H) 10/10/2018 0509   LDLDIRECT 154.2 11/04/2011 1018    Physical Exam:   VS:  BP (!) 185/85   Pulse 79   Ht 5\' 7"  (1.702 m)   Wt 267 lb (121.1 kg)   SpO2 99%   BMI 41.82 kg/m    Wt Readings from Last 3 Encounters:  04/01/19 267 lb (121.1 kg)  03/29/19 268 lb (121.6 kg)  02/27/19 244 lb (110.7 kg)    General: Well nourished, well developed, in no acute distress Heart: Atraumatic, normal size  Eyes: PEERLA, EOMI  Neck: Supple, no JVD Endocrine: No thryomegaly Cardiac: Normal S1, S2; RRR; no murmurs, rubs, or gallops Lungs: Clear to auscultation bilaterally, no wheezing, rhonchi or rales  Abd: Soft, nontender, no hepatomegaly  Ext: 2+ lower extreme edema Musculoskeletal: No deformities, BUE and BLE strength normal and equal Skin: Warm and dry, no rashes   Neuro: Alert and oriented to person, place, time, and situation, CNII-XII grossly intact, no focal deficits  Psych: Normal mood and affect   ASSESSMENT:   Jennifer [name] is a 63 y.o. female who presents for the following: 1. Preoperative cardiovascular examination   2. Coronary artery disease involving native coronary artery of native heart without angina pectoris   3. Essential  hypertension   4. Hyperlipidemia LDL goal <70   5. Obesity (BMI 30-39.9)     PLAN:   1. Preoperative cardiovascular examination - The Revised Cardiac Risk Index = 3 (CAD, CVA, DM on insulin) which equates to 11% (high risk) estimated risk of perioperative myocardial infarction, pulmonary edema, ventricular fibrillation, cardiac arrest, or complete heart block.  -Despite her elevated risk, she is without symptoms.  She is able to complete at least 4 METS without symptoms of angina or shortness of breath.  Given that she has a lack of symptoms, I see no utility in proceeding with stress evaluation prior to her surgery.  She also has had a recent echocardiogram that showed normal left ventricular ejection fraction no evidence of heart failure.  She clinically has no symptoms of heart failure exam either. - No further cardiac testing is recommended prior to surgery. The patient may proceed to surgery at acceptable risk.   -The above risk estimate also does not account for her obesity.  This will need to be accounted for in the perioperative period. -She also remains on aspirin 81 mg twice daily, and apparently per her report her surgeon is okay with this.  2. Coronary artery disease involving native coronary artery of native heart without angina pectoris -No symptoms of angina -She will continue her aspirin statin  3. Essential hypertension -Blood pressure elevated today, will continue current medications for now  4. Hyperlipidemia LDL goal <70 -Most recent LDL not at goal, she will work with her cardiologist after surgery  5. Obesity (BMI 30-39.9) -Counseled importance of weight loss and exercise   Disposition: No follow-ups on file.  Medication Adjustments/Labs and Tests Ordered: Current medicines are reviewed at length with the patient today.  Concerns regarding medicines are outlined above.  Orders Placed This Encounter  Procedures  . EKG 12-Lead   No orders of the defined types  were placed in this encounter.   Patient Instructions  Medication Instructions:  The current medical regimen is effective;  continue present plan and medications.  *If you need a refill  on your cardiac medications before your next appointment, please call your pharmacy*   Follow-Up: At [facility], you and your health needs are our priority.  As part of our continuing mission to provide you with exceptional heart care, we have created designated Provider Care Teams.  These Care Teams include your primary Cardiologist (physician) and Advanced Practice Providers (APPs -  Physician Assistants and Nurse Practitioners) who all work together to provide you with the care you need, when you need it.  Your next appointment:   Follow up as scheduled  The format for your next appointment:   In Person  Provider:   Kate Sable, MD      Signed, Addison Naegeli. Audie Box, [place]  [address], [place], [place] [address] [phone]  04/01/2019 11:09 AM

## 2019-04-01 NOTE — Patient Instructions (Signed)
Medication Instructions:  The current medical regimen is effective;  continue present plan and medications.  *If you need a refill on your cardiac medications before your next appointment, please call your pharmacy*   Follow-Up: At [facility], you and your health needs are our priority.  As part of our continuing mission to provide you with exceptional heart care, we have created designated Provider Care Teams.  These Care Teams include your primary Cardiologist (physician) and Advanced Practice Providers (APPs -  Physician Assistants and Nurse Practitioners) who all work together to provide you with the care you need, when you need it.  Your next appointment:   Follow up as scheduled  The format for your next appointment:   In Person  Provider:   Kate Sable, MD

## 2019-04-01 NOTE — H&P (Signed)
Chief Complaint  Patient presents with  . Knee Pain      bilateral       63 year old female had a stroke in April she is waited the required 6 months had a neurology consult for clearance blood pressure is been handled by Dr. Glori Bickers.  She has diabetes hypothyroidism hypertension is on insulin currently on aspirin twice a day 81 mg which does not need to be held   Presents with chronic knee pain severe quality dull sometimes throbbing her activities of daily living have been affected by this with more difficulty getting in and out of chairs climbing stairs.  She did have to use a cane for ambulation       Past Medical History:  Diagnosis Date  . CAD (coronary artery disease)      cath 5/23 100% dist RCA lesion treated with 2 overlapping Integrity Resolute DES ( 2.25x19mm, 2.25x66mm), 60% mid RCA treated medically, 99% OM2 not amenable to PCI, 70% D1 lesion, EF normal  . Hypercholesteremia    . Hypertension    . Thyroid disease    . Type 2 diabetes mellitus ([place]) 10/08/2018           Past Surgical History:  Procedure Laterality Date  . ABDOMINAL HYSTERECTOMY      . CARDIAC CATHETERIZATION N/A 11/03/2015    Procedure: Left Heart Cath and Coronary Angiography;  Surgeon: Leonie Man, MD;  Location: [place] [facility];  Service: Cardiovascular;  Laterality: N/A;  . CARDIAC CATHETERIZATION N/A 11/03/2015    Procedure: Coronary Stent Intervention;  Surgeon: Leonie Man, MD;  Location: [place] [facility];  Service: Cardiovascular;  Laterality: N/A;  . CARPAL TUNNEL RELEASE          Social History         Tobacco Use  . Smoking status: Former Smoker      Packs/day: 0.25      Types: Cigarettes      Quit date: 10/07/2018      Years since quitting: 0.3  . Smokeless tobacco: Never Used  Substance Use Topics  . Alcohol use: No      Alcohol/week: 0.0 standard drinks  . Drug use: No        Current Outpatient Medications:  .  amLODipine (NORVASC) 10 MG tablet, Take 1 tablet (10  mg total) by mouth daily for 30 days., Disp: 30 tablet, Rfl: 5 .  aspirin EC 81 MG tablet, Take 81 mg by mouth 2 (two) times daily., Disp: , Rfl:  .  atorvastatin (LIPITOR) 80 MG tablet, Take 1 tablet (80 mg total) by mouth daily at 6 PM. (Patient taking differently: Take 80 mg by mouth every morning. ), Disp: 90 tablet, Rfl: 2 .  buPROPion (WELLBUTRIN SR) 150 MG 12 hr tablet, Take 1 tablet by mouth twice daily, Disp: 180 tablet, Rfl: 1 .  carvedilol (COREG) 12.5 MG tablet, Take 1 tablet (12.5 mg total) by mouth daily., Disp: 90 tablet, Rfl: 0 .  DULoxetine (CYMBALTA) 60 MG capsule, Take 1 capsule by mouth once daily, Disp: 90 capsule, Rfl: 30 .  Ezetimibe (ZETIA PO), Take by mouth., Disp: , Rfl:  .  furosemide (LASIX) 20 MG tablet, Take 1 tablet (20 mg total) by mouth daily., Disp: 90 tablet, Rfl: 2 .  HYDROcodone-acetaminophen (NORCO/VICODIN) 5-325 MG tablet, One tablet by mouth every six hours as needed for pain. Must last 30 days, Disp: 60 tablet, Rfl: 0 .  insulin lispro (HUMALOG) 100 UNIT/ML injection, Inject  1-40 Units into the skin continuous. Max bolus dose is 40 units Via pump, Disp: , Rfl:  .  levothyroxine (SYNTHROID) 137 MCG tablet, Take 137 mcg by mouth daily before breakfast., Disp: , Rfl:  .  lisinopril (PRINIVIL,ZESTRIL) 40 MG tablet, Take 1 tablet (40 mg total) by mouth daily., Disp: 90 tablet, Rfl: 3 .  meclizine (ANTIVERT) 25 MG tablet, Take 1 tablet (25 mg total) by mouth 3 (three) times daily as needed for dizziness., Disp: 30 tablet, Rfl: 0 .  nitroGLYCERIN (NITROSTAT) 0.4 MG SL tablet, Place 1 tablet (0.4 mg total) under the tongue every 5 (five) minutes x 3 doses as needed for chest pain., Disp: 25 tablet, Rfl: 3 .  sitaGLIPtin (JANUVIA) 100 MG tablet, Take 100 mg by mouth daily. , Disp: , Rfl:         Family History  Problem Relation Age of Onset  . Heart disease Mother    . Stroke Sister    . Heart attack Brother          BP (!) 156/66   Pulse 75   Ht 5\' 7"   (1.702 m)   Wt 244 lb (110.7 kg)   BMI 38.22 kg/m  Peripheral vascular system no major varicose veins or swelling pulses are normal temperature normal no significant edema no tenderness in the lower extremities   Lymph nodes in the axilla and groin are normal   Gait is remarkable for a cane in the right hand with a slow gait decreased stride length slow cadence and valgus position of the knee   Her finger-nose testing is good her reflexes are normal she has good sensation she is alert and oriented x3 mood and affect are normal   Her skin over the right knee looks clean she hyperextends the knee actually and has 115 degrees of flexion she stable in all planes a lateral joint line is tender she has excellent muscle strength and tone   Upper extremities seem strong with good grip strength normal range of motion no instability normal muscle tone no tenderness normal skin     Prior films show valgus knee mild valgus deformity of severe arthritis lateral compartment secondary bone changes are seen   AP lateral patella shows adequate length patellofemoral replacement with arthritic changes noted   The procedure has been fully reviewed with the patient; The risks and benefits of surgery have been discussed and explained and understood. Alternative treatment has also been reviewed, questions were encouraged and answered. The postoperative plan is also been reviewed.   Patient wants to proceed with a right total knee  Cardiology preop clearance done 04/01/19

## 2019-04-02 ENCOUNTER — Inpatient Hospital Stay (HOSPITAL_COMMUNITY)
Admission: RE | Admit: 2019-04-02 | Discharge: 2019-04-06 | DRG: 469 | Disposition: A | Discharge status: Home Health | Payer: 59 | Attending: Orthopedic Surgery | Admitting: Orthopedic Surgery

## 2019-04-02 ENCOUNTER — Ambulatory Visit (HOSPITAL_COMMUNITY): Payer: 59 | Admitting: Anesthesiology

## 2019-04-02 ENCOUNTER — Other Ambulatory Visit: Payer: Self-pay

## 2019-04-02 ENCOUNTER — Ambulatory Visit (HOSPITAL_COMMUNITY): Payer: 59

## 2019-04-02 ENCOUNTER — Encounter (HOSPITAL_COMMUNITY)
Admission: RE | Disposition: A | Discharge status: Home Health | Payer: Self-pay | Source: Home / Self Care | Attending: Orthopedic Surgery

## 2019-04-02 ENCOUNTER — Encounter (HOSPITAL_COMMUNITY): Payer: Self-pay

## 2019-04-02 DIAGNOSIS — Z96651 Presence of right artificial knee joint: Secondary | ICD-10-CM

## 2019-04-02 DIAGNOSIS — E119 Type 2 diabetes mellitus without complications: Secondary | ICD-10-CM

## 2019-04-02 DIAGNOSIS — F17200 Nicotine dependence, unspecified, uncomplicated: Secondary | ICD-10-CM | POA: Diagnosis present

## 2019-04-02 DIAGNOSIS — D62 Acute posthemorrhagic anemia: Secondary | ICD-10-CM | POA: Diagnosis not present

## 2019-04-02 DIAGNOSIS — I779 Disorder of arteries and arterioles, unspecified: Secondary | ICD-10-CM | POA: Diagnosis present

## 2019-04-02 DIAGNOSIS — T402X5A Adverse effect of other opioids, initial encounter: Secondary | ICD-10-CM | POA: Diagnosis not present

## 2019-04-02 DIAGNOSIS — E7800 Pure hypercholesterolemia, unspecified: Secondary | ICD-10-CM | POA: Diagnosis present

## 2019-04-02 DIAGNOSIS — Z9861 Coronary angioplasty status: Secondary | ICD-10-CM

## 2019-04-02 DIAGNOSIS — Z823 Family history of stroke: Secondary | ICD-10-CM

## 2019-04-02 DIAGNOSIS — J9601 Acute respiratory failure with hypoxia: Secondary | ICD-10-CM | POA: Diagnosis not present

## 2019-04-02 DIAGNOSIS — Z79899 Other long term (current) drug therapy: Secondary | ICD-10-CM

## 2019-04-02 DIAGNOSIS — Z8249 Family history of ischemic heart disease and other diseases of the circulatory system: Secondary | ICD-10-CM

## 2019-04-02 DIAGNOSIS — T464X5A Adverse effect of angiotensin-converting-enzyme inhibitors, initial encounter: Secondary | ICD-10-CM | POA: Diagnosis not present

## 2019-04-02 DIAGNOSIS — J449 Chronic obstructive pulmonary disease, unspecified: Secondary | ICD-10-CM | POA: Diagnosis present

## 2019-04-02 DIAGNOSIS — Z8673 Personal history of transient ischemic attack (TIA), and cerebral infarction without residual deficits: Secondary | ICD-10-CM | POA: Diagnosis present

## 2019-04-02 DIAGNOSIS — E039 Hypothyroidism, unspecified: Secondary | ICD-10-CM | POA: Diagnosis present

## 2019-04-02 DIAGNOSIS — E11319 Type 2 diabetes mellitus with unspecified diabetic retinopathy without macular edema: Secondary | ICD-10-CM | POA: Diagnosis present

## 2019-04-02 DIAGNOSIS — G92 Toxic encephalopathy: Secondary | ICD-10-CM | POA: Diagnosis not present

## 2019-04-02 DIAGNOSIS — E113512 Type 2 diabetes mellitus with proliferative diabetic retinopathy with macular edema, left eye: Secondary | ICD-10-CM

## 2019-04-02 DIAGNOSIS — M1711 Unilateral primary osteoarthritis, right knee: Principal | ICD-10-CM | POA: Diagnosis present

## 2019-04-02 DIAGNOSIS — Z20828 Contact with and (suspected) exposure to other viral communicable diseases: Secondary | ICD-10-CM | POA: Diagnosis present

## 2019-04-02 DIAGNOSIS — R410 Disorientation, unspecified: Secondary | ICD-10-CM

## 2019-04-02 DIAGNOSIS — I110 Hypertensive heart disease with heart failure: Secondary | ICD-10-CM | POA: Diagnosis present

## 2019-04-02 DIAGNOSIS — E1142 Type 2 diabetes mellitus with diabetic polyneuropathy: Secondary | ICD-10-CM | POA: Diagnosis present

## 2019-04-02 DIAGNOSIS — T501X5A Adverse effect of loop [high-ceiling] diuretics, initial encounter: Secondary | ICD-10-CM | POA: Diagnosis not present

## 2019-04-02 DIAGNOSIS — Z87891 Personal history of nicotine dependence: Secondary | ICD-10-CM

## 2019-04-02 DIAGNOSIS — Z794 Long term (current) use of insulin: Secondary | ICD-10-CM

## 2019-04-02 DIAGNOSIS — E1140 Type 2 diabetes mellitus with diabetic neuropathy, unspecified: Secondary | ICD-10-CM | POA: Diagnosis present

## 2019-04-02 DIAGNOSIS — I5032 Chronic diastolic (congestive) heart failure: Secondary | ICD-10-CM | POA: Diagnosis present

## 2019-04-02 DIAGNOSIS — E1165 Type 2 diabetes mellitus with hyperglycemia: Secondary | ICD-10-CM

## 2019-04-02 DIAGNOSIS — Z6841 Body Mass Index (BMI) 40.0 and over, adult: Secondary | ICD-10-CM

## 2019-04-02 DIAGNOSIS — I639 Cerebral infarction, unspecified: Secondary | ICD-10-CM | POA: Diagnosis present

## 2019-04-02 DIAGNOSIS — N179 Acute kidney failure, unspecified: Secondary | ICD-10-CM | POA: Diagnosis not present

## 2019-04-02 DIAGNOSIS — I10 Essential (primary) hypertension: Secondary | ICD-10-CM | POA: Diagnosis present

## 2019-04-02 DIAGNOSIS — I2510 Atherosclerotic heart disease of native coronary artery without angina pectoris: Secondary | ICD-10-CM | POA: Diagnosis present

## 2019-04-02 DIAGNOSIS — Z79890 Hormone replacement therapy (postmenopausal): Secondary | ICD-10-CM

## 2019-04-02 HISTORY — PX: TOTAL KNEE ARTHROPLASTY: SHX125

## 2019-04-02 LAB — GLUCOSE, CAPILLARY
Glucose-Capillary: 81 mg/dL (ref 70–99)
Glucose-Capillary: 122 mg/dL — ABNORMAL HIGH (ref 70–99)
Glucose-Capillary: 138 mg/dL — ABNORMAL HIGH (ref 70–99)
Glucose-Capillary: 200 mg/dL — ABNORMAL HIGH (ref 70–99)
Glucose-Capillary: 215 mg/dL — ABNORMAL HIGH (ref 70–99)

## 2019-04-02 SURGERY — ARTHROPLASTY, KNEE, TOTAL
Anesthesia: General | Site: Knee | Laterality: Right

## 2019-04-02 MED ORDER — 0.9 % SODIUM CHLORIDE (POUR BTL) OPTIME
TOPICAL | Status: DC | PRN
  Administered 2019-04-02: 1000 mL

## 2019-04-02 MED ORDER — TRAMADOL HCL 50 MG PO TABS
50.0000 mg | ORAL_TABLET | Freq: Four times a day (QID) | ORAL | Status: DC
  Administered 2019-04-02 – 2019-04-04 (×5): 50 mg via ORAL
  Filled 2019-04-02 (×5): qty 1

## 2019-04-02 MED ORDER — NITROGLYCERIN 0.4 MG SL SUBL: 0.4000 mg | SUBLINGUAL_TABLET | SUBLINGUAL | Status: DC | PRN

## 2019-04-02 MED ORDER — ALUM & MAG HYDROXIDE-SIMETH 200-200-20 MG/5ML PO SUSP: 30.0000 mL | ORAL | Status: DC | PRN

## 2019-04-02 MED ORDER — MIDAZOLAM HCL 2 MG/2ML IJ SOLN: 0.5000 mg | Freq: Once | INTRAMUSCULAR | Status: DC | PRN

## 2019-04-02 MED ORDER — ATORVASTATIN CALCIUM 40 MG PO TABS
80.0000 mg | ORAL_TABLET | Freq: Every morning | ORAL | Status: DC
  Administered 2019-04-03 – 2019-04-06 (×4): 80 mg via ORAL
  Filled 2019-04-02 (×4): qty 2

## 2019-04-02 MED ORDER — PHENOL 1.4 % MT LIQD: 1.0000 | OROMUCOSAL | Status: DC | PRN

## 2019-04-02 MED ORDER — INSULIN LISPRO 100 UNIT/ML ~~LOC~~ SOLN: 1.0000 [IU] | SUBCUTANEOUS | Status: DC

## 2019-04-02 MED ORDER — TRANEXAMIC ACID-NACL 1000-0.7 MG/100ML-% IV SOLN
1000.0000 mg | INTRAVENOUS | Status: AC
  Administered 2019-04-02: 1000 mg via INTRAVENOUS
  Filled 2019-04-02: qty 100

## 2019-04-02 MED ORDER — LIDOCAINE HCL (CARDIAC) PF 100 MG/5ML IV SOSY
PREFILLED_SYRINGE | INTRAVENOUS | Status: DC | PRN
  Administered 2019-04-02: 40 mg via INTRAVENOUS

## 2019-04-02 MED ORDER — METOCLOPRAMIDE HCL 10 MG PO TABS: 5.0000 mg | ORAL_TABLET | Freq: Three times a day (TID) | ORAL | Status: DC | PRN

## 2019-04-02 MED ORDER — LACTATED RINGERS IV SOLN: INTRAVENOUS | Status: DC

## 2019-04-02 MED ORDER — ONDANSETRON HCL 4 MG/2ML IJ SOLN
INTRAMUSCULAR | Status: DC | PRN
  Administered 2019-04-02: 4 mg via INTRAVENOUS

## 2019-04-02 MED ORDER — AMLODIPINE BESYLATE 5 MG PO TABS
10.0000 mg | ORAL_TABLET | Freq: Every day | ORAL | Status: DC
  Administered 2019-04-02: 10 mg via ORAL
  Filled 2019-04-02 (×3): qty 2

## 2019-04-02 MED ORDER — LINAGLIPTIN 5 MG PO TABS
5.0000 mg | ORAL_TABLET | Freq: Every day | ORAL | Status: DC
  Administered 2019-04-03 – 2019-04-06 (×4): 5 mg via ORAL
  Filled 2019-04-02 (×5): qty 1

## 2019-04-02 MED ORDER — BUPIVACAINE LIPOSOME 1.3 % IJ SUSP
INTRAMUSCULAR | Status: AC
  Filled 2019-04-02: qty 20

## 2019-04-02 MED ORDER — EPHEDRINE SULFATE 50 MG/ML IJ SOLN
INTRAMUSCULAR | Status: DC | PRN
  Administered 2019-04-02 (×2): 10 mg via INTRAVENOUS

## 2019-04-02 MED ORDER — PROMETHAZINE HCL 25 MG/ML IJ SOLN: 6.2500 mg | INTRAMUSCULAR | Status: DC | PRN

## 2019-04-02 MED ORDER — MIDAZOLAM HCL 5 MG/5ML IJ SOLN
INTRAMUSCULAR | Status: DC | PRN
  Administered 2019-04-02: 2 mg via INTRAVENOUS

## 2019-04-02 MED ORDER — SUCCINYLCHOLINE CHLORIDE 20 MG/ML IJ SOLN
INTRAMUSCULAR | Status: DC | PRN
  Administered 2019-04-02: 160 mg via INTRAVENOUS

## 2019-04-02 MED ORDER — VANCOMYCIN HCL IN DEXTROSE 1-5 GM/200ML-% IV SOLN
1000.0000 mg | Freq: Two times a day (BID) | INTRAVENOUS | Status: AC
  Administered 2019-04-02: 1000 mg via INTRAVENOUS
  Filled 2019-04-02: qty 200

## 2019-04-02 MED ORDER — METHOCARBAMOL 500 MG PO TABS: 500.0000 mg | ORAL_TABLET | Freq: Four times a day (QID) | ORAL | Status: DC | PRN

## 2019-04-02 MED ORDER — ROCURONIUM BROMIDE 100 MG/10ML IV SOLN
INTRAVENOUS | Status: DC | PRN
  Administered 2019-04-02: 40 mg via INTRAVENOUS

## 2019-04-02 MED ORDER — CELECOXIB 100 MG PO CAPS
200.0000 mg | ORAL_CAPSULE | Freq: Every day | ORAL | Status: DC
  Filled 2019-04-02: qty 2

## 2019-04-02 MED ORDER — FENTANYL CITRATE (PF) 100 MCG/2ML IJ SOLN
INTRAMUSCULAR | Status: DC | PRN
  Administered 2019-04-02 (×3): 50 ug via INTRAVENOUS
  Administered 2019-04-02: 25 ug via INTRAVENOUS
  Administered 2019-04-02 (×2): 50 ug via INTRAVENOUS
  Administered 2019-04-02: 25 ug via INTRAVENOUS

## 2019-04-02 MED ORDER — SODIUM CHLORIDE 0.9 % IV SOLN
INTRAVENOUS | Status: AC
  Administered 2019-04-02: 18:00:00 via INTRAVENOUS

## 2019-04-02 MED ORDER — POLYETHYLENE GLYCOL 3350 17 G PO PACK
17.0000 g | PACK | Freq: Every day | ORAL | Status: DC
  Administered 2019-04-02 – 2019-04-06 (×5): 17 g via ORAL
  Filled 2019-04-02 (×5): qty 1

## 2019-04-02 MED ORDER — SODIUM CHLORIDE 0.9 % IV SOLN
INTRAVENOUS | Status: DC | PRN
  Administered 2019-04-02: 07:00:00 via INTRAVENOUS

## 2019-04-02 MED ORDER — HYDROMORPHONE HCL 1 MG/ML IJ SOLN
0.2500 mg | INTRAMUSCULAR | Status: DC | PRN
  Administered 2019-04-02: 0.5 mg via INTRAVENOUS
  Filled 2019-04-02: qty 0.5

## 2019-04-02 MED ORDER — BISACODYL 10 MG RE SUPP: 10.0000 mg | Freq: Every day | RECTAL | Status: DC | PRN

## 2019-04-02 MED ORDER — MENTHOL 3 MG MT LOZG: 1.0000 | LOZENGE | OROMUCOSAL | Status: DC | PRN

## 2019-04-02 MED ORDER — PREGABALIN 50 MG PO CAPS
50.0000 mg | ORAL_CAPSULE | Freq: Three times a day (TID) | ORAL | Status: DC
  Administered 2019-04-02 – 2019-04-04 (×7): 50 mg via ORAL
  Filled 2019-04-02: qty 2
  Filled 2019-04-02 (×5): qty 1

## 2019-04-02 MED ORDER — PREGABALIN 50 MG PO CAPS
50.0000 mg | ORAL_CAPSULE | Freq: Three times a day (TID) | ORAL | Status: DC
  Filled 2019-04-02: qty 1

## 2019-04-02 MED ORDER — MAGNESIUM CITRATE PO SOLN: 1.0000 | Freq: Once | ORAL | Status: DC | PRN

## 2019-04-02 MED ORDER — DULOXETINE HCL 60 MG PO CPEP
60.0000 mg | ORAL_CAPSULE | Freq: Every day | ORAL | Status: DC
  Administered 2019-04-02 – 2019-04-06 (×5): 60 mg via ORAL
  Filled 2019-04-02 (×6): qty 1

## 2019-04-02 MED ORDER — ONDANSETRON HCL 4 MG/2ML IJ SOLN
4.0000 mg | Freq: Four times a day (QID) | INTRAMUSCULAR | Status: DC
  Administered 2019-04-02 – 2019-04-06 (×12): 4 mg via INTRAVENOUS
  Filled 2019-04-02 (×14): qty 2

## 2019-04-02 MED ORDER — BUPROPION HCL ER (SR) 150 MG PO TB12
150.0000 mg | ORAL_TABLET | Freq: Two times a day (BID) | ORAL | Status: DC
  Administered 2019-04-02 – 2019-04-06 (×8): 150 mg via ORAL
  Filled 2019-04-02 (×8): qty 1

## 2019-04-02 MED ORDER — DOCUSATE SODIUM 100 MG PO CAPS
100.0000 mg | ORAL_CAPSULE | Freq: Two times a day (BID) | ORAL | Status: DC
  Administered 2019-04-02 – 2019-04-04 (×4): 100 mg via ORAL
  Filled 2019-04-02 (×4): qty 1

## 2019-04-02 MED ORDER — CHLORHEXIDINE GLUCONATE 4 % EX LIQD: 60.0000 mL | Freq: Once | CUTANEOUS | Status: DC

## 2019-04-02 MED ORDER — BUPIVACAINE-EPINEPHRINE (PF) 0.5% -1:200000 IJ SOLN
INTRAMUSCULAR | Status: AC
  Filled 2019-04-02: qty 30

## 2019-04-02 MED ORDER — BUPIVACAINE LIPOSOME 1.3 % IJ SUSP
INTRAMUSCULAR | Status: DC | PRN
  Administered 2019-04-02: 20 mL

## 2019-04-02 MED ORDER — VANCOMYCIN HCL IN DEXTROSE 1-5 GM/200ML-% IV SOLN: 1000.0000 mg | INTRAVENOUS | Status: DC

## 2019-04-02 MED ORDER — HYDROCODONE-ACETAMINOPHEN 5-325 MG PO TABS: 1.0000 | ORAL_TABLET | ORAL | Status: DC | PRN

## 2019-04-02 MED ORDER — ACETAMINOPHEN 500 MG PO TABS
500.0000 mg | ORAL_TABLET | Freq: Four times a day (QID) | ORAL | Status: AC
  Administered 2019-04-02 – 2019-04-03 (×4): 500 mg via ORAL
  Filled 2019-04-02 (×4): qty 1

## 2019-04-02 MED ORDER — LEVOTHYROXINE SODIUM 137 MCG PO TABS
137.0000 ug | ORAL_TABLET | Freq: Every day | ORAL | Status: DC
  Administered 2019-04-03 – 2019-04-06 (×4): 137 ug via ORAL
  Filled 2019-04-02 (×4): qty 1

## 2019-04-02 MED ORDER — INSULIN ASPART 100 UNIT/ML ~~LOC~~ SOLN
0.0000 [IU] | Freq: Three times a day (TID) | SUBCUTANEOUS | Status: DC
  Administered 2019-04-02: 1 [IU] via SUBCUTANEOUS
  Administered 2019-04-02: 2 [IU] via SUBCUTANEOUS
  Administered 2019-04-03: 1 [IU] via SUBCUTANEOUS
  Administered 2019-04-03: 2 [IU] via SUBCUTANEOUS
  Administered 2019-04-04 – 2019-04-06 (×5): 1 [IU] via SUBCUTANEOUS
  Filled 2019-04-02: qty 0.09

## 2019-04-02 MED ORDER — ONDANSETRON HCL 4 MG/2ML IJ SOLN: 4.0000 mg | Freq: Four times a day (QID) | INTRAMUSCULAR | Status: DC | PRN

## 2019-04-02 MED ORDER — BUPIVACAINE-EPINEPHRINE (PF) 0.5% -1:200000 IJ SOLN
INTRAMUSCULAR | Status: DC | PRN
  Administered 2019-04-02: 20 mL via PERINEURAL

## 2019-04-02 MED ORDER — VANCOMYCIN HCL 10 G IV SOLR
1500.0000 mg | INTRAVENOUS | Status: AC
  Administered 2019-04-02: 1500 mg via INTRAVENOUS
  Filled 2019-04-02: qty 1500

## 2019-04-02 MED ORDER — SODIUM CHLORIDE 0.9 % IR SOLN
Status: DC | PRN
  Administered 2019-04-02: 3000 mL

## 2019-04-02 MED ORDER — NAPROXEN 250 MG PO TABS: 250.0000 mg | ORAL_TABLET | Freq: Two times a day (BID) | ORAL | Status: DC

## 2019-04-02 MED ORDER — OXYCODONE HCL 5 MG PO TABS
5.0000 mg | ORAL_TABLET | ORAL | Status: DC
  Administered 2019-04-02 – 2019-04-03 (×7): 5 mg via ORAL
  Filled 2019-04-02 (×8): qty 1

## 2019-04-02 MED ORDER — GABAPENTIN 300 MG PO CAPS
300.0000 mg | ORAL_CAPSULE | Freq: Three times a day (TID) | ORAL | Status: DC
  Administered 2019-04-02 – 2019-04-04 (×6): 300 mg via ORAL
  Filled 2019-04-02 (×6): qty 1

## 2019-04-02 MED ORDER — TEMAZEPAM 15 MG PO CAPS: 15.0000 mg | ORAL_CAPSULE | Freq: Every evening | ORAL | Status: DC | PRN

## 2019-04-02 MED ORDER — FUROSEMIDE 20 MG PO TABS
20.0000 mg | ORAL_TABLET | Freq: Every day | ORAL | Status: DC
  Administered 2019-04-02: 20 mg via ORAL
  Filled 2019-04-02 (×3): qty 1

## 2019-04-02 MED ORDER — HYDROCODONE-ACETAMINOPHEN 7.5-325 MG PO TABS: 1.0000 | ORAL_TABLET | Freq: Once | ORAL | Status: DC | PRN

## 2019-04-02 MED ORDER — METHOCARBAMOL 1000 MG/10ML IJ SOLN
500.0000 mg | Freq: Four times a day (QID) | INTRAVENOUS | Status: DC
  Administered 2019-04-02 – 2019-04-04 (×9): 500 mg via INTRAVENOUS
  Filled 2019-04-02 (×2): qty 5
  Filled 2019-04-02 (×2): qty 500
  Filled 2019-04-02 (×2): qty 5
  Filled 2019-04-02 (×5): qty 500
  Filled 2019-04-02: qty 5
  Filled 2019-04-02: qty 500

## 2019-04-02 MED ORDER — MORPHINE SULFATE (PF) 2 MG/ML IV SOLN: 0.5000 mg | INTRAVENOUS | Status: DC | PRN

## 2019-04-02 MED ORDER — LACTATED RINGERS IV SOLN
INTRAVENOUS | Status: DC | PRN
  Administered 2019-04-02 (×2): via INTRAVENOUS

## 2019-04-02 MED ORDER — POVIDONE-IODINE 10 % EX SWAB: 2.0000 "application " | Freq: Once | CUTANEOUS | Status: DC

## 2019-04-02 MED ORDER — MECLIZINE HCL 12.5 MG PO TABS: 25.0000 mg | ORAL_TABLET | Freq: Three times a day (TID) | ORAL | Status: DC | PRN

## 2019-04-02 MED ORDER — DEXAMETHASONE SODIUM PHOSPHATE 4 MG/ML IJ SOLN
INTRAMUSCULAR | Status: DC | PRN
  Administered 2019-04-02: 4 mg via INTRAVENOUS

## 2019-04-02 MED ORDER — CARVEDILOL 12.5 MG PO TABS
12.5000 mg | ORAL_TABLET | Freq: Two times a day (BID) | ORAL | Status: DC
  Administered 2019-04-02: 12.5 mg via ORAL
  Filled 2019-04-02 (×4): qty 1

## 2019-04-02 MED ORDER — APIXABAN 2.5 MG PO TABS
2.5000 mg | ORAL_TABLET | Freq: Two times a day (BID) | ORAL | Status: DC
  Administered 2019-04-03 – 2019-04-06 (×8): 2.5 mg via ORAL
  Filled 2019-04-02 (×8): qty 1

## 2019-04-02 MED ORDER — PROPOFOL 10 MG/ML IV BOLUS
INTRAVENOUS | Status: DC | PRN
  Administered 2019-04-02: 150 mg via INTRAVENOUS

## 2019-04-02 MED ORDER — METHOCARBAMOL 1000 MG/10ML IJ SOLN
500.0000 mg | Freq: Four times a day (QID) | INTRAVENOUS | Status: DC | PRN
  Filled 2019-04-02: qty 5

## 2019-04-02 MED ORDER — SUGAMMADEX SODIUM 200 MG/2ML IV SOLN
INTRAVENOUS | Status: DC | PRN
  Administered 2019-04-02: 242.2 mg via INTRAVENOUS

## 2019-04-02 MED ORDER — CELECOXIB 100 MG PO CAPS
200.0000 mg | ORAL_CAPSULE | Freq: Every day | ORAL | Status: DC
  Administered 2019-04-02 – 2019-04-04 (×3): 200 mg via ORAL
  Filled 2019-04-02 (×2): qty 2

## 2019-04-02 MED ORDER — DIPHENHYDRAMINE HCL 12.5 MG/5ML PO ELIX: 12.5000 mg | ORAL_SOLUTION | ORAL | Status: DC | PRN

## 2019-04-02 MED ORDER — METOCLOPRAMIDE HCL 5 MG/ML IJ SOLN: 5.0000 mg | Freq: Three times a day (TID) | INTRAMUSCULAR | Status: DC | PRN

## 2019-04-02 MED ORDER — EZETIMIBE 10 MG PO TABS
10.0000 mg | ORAL_TABLET | Freq: Every day | ORAL | Status: DC
  Administered 2019-04-02 – 2019-04-06 (×5): 10 mg via ORAL
  Filled 2019-04-02 (×5): qty 1

## 2019-04-02 MED ORDER — KETOROLAC TROMETHAMINE 15 MG/ML IJ SOLN
7.5000 mg | Freq: Four times a day (QID) | INTRAMUSCULAR | Status: AC
  Administered 2019-04-02 – 2019-04-03 (×4): 7.5 mg via INTRAVENOUS
  Filled 2019-04-02 (×4): qty 1

## 2019-04-02 MED ORDER — ONDANSETRON HCL 4 MG PO TABS
4.0000 mg | ORAL_TABLET | Freq: Four times a day (QID) | ORAL | Status: DC | PRN
  Administered 2019-04-03: 4 mg via ORAL
  Filled 2019-04-02: qty 1

## 2019-04-02 MED ORDER — LISINOPRIL 10 MG PO TABS
40.0000 mg | ORAL_TABLET | Freq: Every day | ORAL | Status: DC
  Administered 2019-04-03: 40 mg via ORAL
  Filled 2019-04-02 (×2): qty 4

## 2019-04-02 SURGICAL SUPPLY — 71 items
BANDAGE ELASTIC 4 VELCRO NS (GAUZE/BANDAGES/DRESSINGS) ×4 IMPLANT
BANDAGE ELASTIC 6 VELCRO NS (GAUZE/BANDAGES/DRESSINGS) ×2 IMPLANT
BANDAGE ESMARK 6X9 LF (GAUZE/BANDAGES/DRESSINGS) ×1 IMPLANT
BIT DRILL 3.2X128 (BIT) IMPLANT
BLADE HEX COATED 2.75 (ELECTRODE) ×2 IMPLANT
BLADE SAGITTAL 25.0X1.27X90 (BLADE) ×2 IMPLANT
BNDG CMPR 9X6 STRL LF SNTH (GAUZE/BANDAGES/DRESSINGS) ×1
BNDG ESMARK 6X9 LF (GAUZE/BANDAGES/DRESSINGS) ×2
CEMENT HV SMART SET (Cement) ×4 IMPLANT
CLOTH BEACON ORANGE TIMEOUT ST (SAFETY) ×2 IMPLANT
COOLER CRYO CUFF IC AND MOTOR (MISCELLANEOUS) ×2 IMPLANT
COVER LIGHT HANDLE STERIS (MISCELLANEOUS) ×4 IMPLANT
COVER WAND RF STERILE (DRAPES) ×2 IMPLANT
CUFF CRYO KNEE LG 20X31 COOLER (ORTHOPEDIC SUPPLIES) ×1 IMPLANT
CUFF TOURN SGL QUICK 34 (TOURNIQUET CUFF) ×2
CUFF TRNQT CYL 34X4.125X (TOURNIQUET CUFF) ×1 IMPLANT
DECANTER SPIKE VIAL GLASS SM (MISCELLANEOUS) ×2 IMPLANT
DRAPE BACK TABLE (DRAPES) ×2 IMPLANT
DRAPE EXTREMITY T 121X128X90 (DISPOSABLE) ×2 IMPLANT
DRAPE HALF SHEET 40X57 (DRAPES) ×1 IMPLANT
DRSG MEPILEX BORDER 4X12 (GAUZE/BANDAGES/DRESSINGS) ×2 IMPLANT
DURAPREP 26ML APPLICATOR (WOUND CARE) ×4 IMPLANT
ELECT REM PT RETURN 9FT ADLT (ELECTROSURGICAL) ×2
ELECTRODE REM PT RTRN 9FT ADLT (ELECTROSURGICAL) ×1 IMPLANT
FEMUR RIGHT SZ 3 (Knees) ×1 IMPLANT
GLOVE BIO SURGEON STRL SZ7 (GLOVE) ×4 IMPLANT
GLOVE BIOGEL PI IND STRL 7.0 (GLOVE) ×2 IMPLANT
GLOVE BIOGEL PI INDICATOR 7.0 (GLOVE) ×4
GLOVE ECLIPSE 6.5 STRL STRAW (GLOVE) ×1 IMPLANT
GLOVE SKINSENSE NS SZ8.0 LF (GLOVE) ×2
GLOVE SKINSENSE STRL SZ8.0 LF (GLOVE) ×2 IMPLANT
GLOVE SS N UNI LF 8.5 STRL (GLOVE) ×2 IMPLANT
GOWN STRL REUS W/TWL LRG LVL3 (GOWN DISPOSABLE) ×6 IMPLANT
GOWN STRL REUS W/TWL XL LVL3 (GOWN DISPOSABLE) ×2 IMPLANT
HANDPIECE INTERPULSE COAX TIP (DISPOSABLE) ×2
HOOD W/PEELAWAY (MISCELLANEOUS) ×8 IMPLANT
INSERT STABILIZED 10MM (Knees) ×1 IMPLANT
INST SET MAJOR BONE (KITS) ×2 IMPLANT
IV NS IRRIG 3000ML ARTHROMATIC (IV SOLUTION) ×2 IMPLANT
KIT BLADEGUARD II DBL (SET/KITS/TRAYS/PACK) ×2 IMPLANT
KIT TURNOVER KIT A (KITS) ×2 IMPLANT
MANIFOLD NEPTUNE II (INSTRUMENTS) ×2 IMPLANT
MARKER SKIN DUAL TIP RULER LAB (MISCELLANEOUS) ×2 IMPLANT
NDL HYPO 18GX1.5 BLUNT FILL (NEEDLE) ×1 IMPLANT
NDL HYPO 21X1.5 SAFETY (NEEDLE) ×1 IMPLANT
NEEDLE HYPO 18GX1.5 BLUNT FILL (NEEDLE) ×2 IMPLANT
NEEDLE HYPO 21X1.5 SAFETY (NEEDLE) ×2 IMPLANT
NS IRRIG 1000ML POUR BTL (IV SOLUTION) ×2 IMPLANT
PACK TOTAL JOINT (CUSTOM PROCEDURE TRAY) ×2 IMPLANT
PAD ARMBOARD 7.5X6 YLW CONV (MISCELLANEOUS) ×2 IMPLANT
PATELLA DOME PFC 38MM (Knees) ×1 IMPLANT
PILLOW KNEE EXTENSION 0 DEG (MISCELLANEOUS) ×2 IMPLANT
PIN THREADED HEADED SIGMA (PIN) ×1 IMPLANT
PIN TROCAR 3 INCH (PIN) IMPLANT
PIN/DRILL PACK ORTHO 1/8X3.0 (PIN) ×1 IMPLANT
SAW OSC TIP CART 19.5X105X1.3 (SAW) ×2 IMPLANT
SET BASIN LINEN APH (SET/KITS/TRAYS/PACK) ×2 IMPLANT
SET HNDPC FAN SPRY TIP SCT (DISPOSABLE) ×1 IMPLANT
STAPLER VISISTAT 35W (STAPLE) ×2 IMPLANT
SUT BRALON NAB BRD #1 30IN (SUTURE) ×2 IMPLANT
SUT MNCRL 0 VIOLET CTX 36 (SUTURE) ×1 IMPLANT
SUT MON AB 0 CT1 (SUTURE) ×2 IMPLANT
SUT MONOCRYL 0 CTX 36 (SUTURE) ×1
SYR 20ML LL LF (SYRINGE) ×6 IMPLANT
SYR BULB IRRIGATION 50ML (SYRINGE) ×2 IMPLANT
TOWEL OR 17X26 4PK STRL BLUE (TOWEL DISPOSABLE) ×2 IMPLANT
TOWER CARTRIDGE SMART MIX (DISPOSABLE) ×2 IMPLANT
TRAY FOLEY MTR SLVR 16FR STAT (SET/KITS/TRAYS/PACK) ×2 IMPLANT
TRAY TIBIAL SZ3 COBALT 71X47MM (Knees) ×1 IMPLANT
WATER STERILE IRR 1000ML POUR (IV SOLUTION) ×4 IMPLANT
YANKAUER SUCT 12FT TUBE ARGYLE (SUCTIONS) ×2 IMPLANT

## 2019-04-02 NOTE — Interval H&P Note (Signed)
History and Physical Interval Note:  04/02/2019 7:17 AM   BP 138/65   Pulse 68   Temp 98.1 F (36.7 C) (Oral)   Resp 19   Ht 5\' 7"  (1.702 m)   Wt 121.1 kg   SpO2 96%   BMI 41.82 kg/m   CBC Latest Ref Rng & Units 03/29/2019 10/08/2018 06/16/2018  WBC 4.0 - 10.5 K/uL 7.6 7.9 -  Hemoglobin 12.0 - 15.0 g/dL 12.1 12.8 11.6(L)  Hematocrit 36.0 - 46.0 % 38.9 40.6 34.0(L)  Platelets 150 - 400 K/uL 227 72 -     Jennifer [name]  has presented today for surgery, with the diagnosis of osteoarthritis right knee.  The various methods of treatment have been discussed with the patient and family. After consideration of risks, benefits and other options for treatment, the patient has consented to  Procedure(s): TOTAL KNEE ARTHROPLASTY (Right) as a surgical intervention.  The patient's history has been reviewed, patient examined, no change in status, stable for surgery.  I have reviewed the patient's chart and labs.  Questions were answered to the patient's satisfaction.     Arther Abbott

## 2019-04-02 NOTE — Brief Op Note (Signed)
04/02/2019  9:29 AM  PATIENT:  Jennifer [name]  63 y.o. female  PRE-OPERATIVE DIAGNOSIS:  osteoarthritis right knee  POST-OPERATIVE DIAGNOSIS:  osteoarthritis right knee  Findings at surgery Supple valgus deformity Grade 4 wear of the entire lateral femoral condyle without hypoplasia grade 4 wear of the tibial plateau grade 2 wear of the patella and trochlea  Lateral meniscus degenerative but intact  Medial meniscus intact  ACL PCL intact  No flexion contracture under anesthesia in fact she had 5 degrees of hyperextension Preop flexion under anesthesia 125 degrees   PROCEDURE:  Procedure(s): TOTAL KNEE ARTHROPLASTY (Right)   DEPUY SIGMA FIXED BEARING PS  33F  3T 38 X 9 P 10 PS INSERT   Femoral cut distally 9 mm Tibial cut 4 mm from the lateral side Patella cut from 23 mm to 12 mm  SURGEON:  Surgeon(s) and Role:    Carole Civil, MD - Primary   Details of surgery  The patient was seen in preop the surgical site was confirmed his right knee chart review was completed and the surgical site was marked right knee  Patient was taken to the operating room where she got 1.5 g of vancomycin  She had general anesthesia at her request  A Foley catheter was inserted  The right leg was prepped and draped sterilely  Timeout was completed  The limb was exsanguinated with a 6 inch Esmarch the tourniquet was elevated to 300 mmHg   Midline incision was made with the knee in extension subcutaneous tissue was divided down to the extensor mechanism.  A medial arthrotomy was performed the patella was everted and the fat pad was resected.  The medial soft tissue sleeve was elevated to the mid coronal plane  The lateral meniscus medial meniscus anterior horns were resected.  ACL and PCL were resected.  3/8 inch drill bit was used to enter the femoral canal intramedullary guide was placed at 5 degree valgus right knee 10 mm cut The block was pinned and the bone was removed.   We measured the bone and it was 9 mm this was accepted.  The tibia was brought forward and the remaining menisci remnants of the cruciate ligaments were resected.  The patella femoral ligament was released.  The tibial guide was brought in and set off the tibial spines and tibial tubercle and ankle joint with a 4 mm resection from the lateral side.  This bone was resected and the tibia was measured to have a 3 baseplate.  Extension gap was checked and found to be balanced and full extension obtained with a 10 spacer  We then measured the femur to a size 3.  We pinned the block for 3 femur and made the anterior and posterior cuts and then checked the flexion gap with a 10 spacer.  This was stable and balanced  We made the remaining chamfer cuts.  We made the box cut.  We did a trial we got good range of motion up to 125 degrees normal patellar tracking full extension stable ligaments.  We then punched the tibia  We then cut the patella from 23- 12 mm thickness and drilled for a 38 x 9 button  We then irrigated the joint injected the Marcaine and exparel while cement was prepared on the back table  We then cemented implants in place and while the cement was curing we removed any excess cement.  We irrigated again check for any remaining cement and removed it.  Passive flexion test was 120 degrees knee came to full extension was balanced in flexion extension  Wound was irrigated and then we began closure  We closed with a #1 Braylon for the extensor mechanism 0 Monocryl for the subcu tissue and staples to reapproximate the skin  The dressing and Cryo/Cuff were placed a Cryo/Cuff was activated  The patient was taken to recovery room in stable condition  PHYSICIAN ASSISTANT:   Corrie Dandy was the first assistant   We had a second assistant   General anesthesia   Estimated blood loss 10 cc   Tourniquet time 1 hour 22 minutes   Local medications used 30 cc of Marcaine with  epinephrine 20 cc of Exparel full-strength   No drains   Under needle counts were correct   Specimens no specimens   DICTATION: .Dragon Dictation  PLAN OF CARE: Admit to inpatient   PATIENT DISPOSITION:  PACU - hemodynamically stable.   Delay start of Pharmacological VTE agent (>24hrs) due to surgical blood loss or risk of bleeding: no

## 2019-04-02 NOTE — Anesthesia Procedure Notes (Signed)
Procedure Name: Intubation Date/Time: 04/02/2019 7:35 AM Performed by: Andree Elk, Andriy Sherk A, CRNA Pre-anesthesia Checklist: Patient identified, Patient being monitored, Timeout performed, Emergency Drugs available and Suction available Patient Re-evaluated:Patient Re-evaluated prior to induction Oxygen Delivery Method: Circle system utilized Preoxygenation: Pre-oxygenation with 100% oxygen Induction Type: IV induction Ventilation: Mask ventilation without difficulty Laryngoscope Size: Mac and 3 Grade View: Grade I Tube type: Oral Tube size: 7.0 mm Number of attempts: 1 Airway Equipment and Method: Stylet Placement Confirmation: ETT inserted through vocal cords under direct vision,  positive ETCO2 and breath sounds checked- equal and bilateral Secured at: 21 cm Tube secured with: Tape Dental Injury: Teeth and Oropharynx as per pre-operative assessment

## 2019-04-02 NOTE — Transfer of Care (Signed)
Immediate Anesthesia Transfer of Care Note  Patient: Jennifer [name]  Procedure(s) Performed: TOTAL KNEE ARTHROPLASTY (Right Knee)  Patient Location: PACU  Anesthesia Type:General  Level of Consciousness: awake, alert , oriented and patient cooperative  Airway & Oxygen Therapy: Patient Spontanous Breathing and Patient connected to face mask oxygen  Post-op Assessment: Report given to RN and Post -op Vital signs reviewed and stable  Post vital signs: Reviewed and stable  Last Vitals:  Vitals Value Taken Time  BP 126/56 04/02/19 0935  Temp    Pulse 77 04/02/19 0945  Resp 15 04/02/19 0945  SpO2 100 % 04/02/19 0945  Vitals shown include unvalidated device data.  Last Pain:  Vitals:   04/02/19 0703  TempSrc: Oral  PainSc:          Complications: No apparent anesthesia complications

## 2019-04-02 NOTE — Anesthesia Postprocedure Evaluation (Signed)
Anesthesia Post Note  Patient: Jennifer [name]  Procedure(s) Performed: TOTAL KNEE ARTHROPLASTY (Right Knee)  Patient location during evaluation: PACU Anesthesia Type: General Level of consciousness: awake and alert and oriented Pain management: pain level controlled Vital Signs Assessment: post-procedure vital signs reviewed and stable Respiratory status: spontaneous breathing Cardiovascular status: stable Postop Assessment: no apparent nausea or vomiting Anesthetic complications: no     Last Vitals:  Vitals:   04/02/19 0703 04/02/19 0945  BP: 138/65 129/67  Pulse:  77  Resp:  15  Temp: 36.7 C (P) 36.7 C  SpO2:  100%    Last Pain:  Vitals:   04/02/19 0945  TempSrc:   PainSc: (P) Asleep                 Lonzo Saulter A

## 2019-04-02 NOTE — Brief Op Note (Signed)
04/02/2019  9:36 AM  PATIENT:  Jennifer [name]  63 y.o. female  PRE-OPERATIVE DIAGNOSIS:  osteoarthritis right knee  POST-OPERATIVE DIAGNOSIS:  osteoarthritis right knee  Findings at surgery Supple valgus deformity Grade 4 wear of the entire lateral femoral condyle without hypoplasia grade 4 wear of the tibial plateau grade 2 wear of the patella and trochlea  Lateral meniscus degenerative but intact  Medial meniscus intact  ACL PCL intact  No flexion contracture under anesthesia in fact she had 5 degrees of hyperextension Preop flexion under anesthesia 125 degrees   PROCEDURE:  Procedure(s): TOTAL KNEE ARTHROPLASTY (Right)   DEPUY SIGMA FIXED BEARING PS  44F  3T 38 X 9 P 10 PS INSERT   Femoral cut distally 9 mm Tibial cut 4 mm from the lateral side Patella cut from 23 mm to 12 mm  SURGEON:  Surgeon(s) and Role:    Carole Civil, MD - Primary   Details of surgery  The patient was seen in preop the surgical site was confirmed his right knee chart review was completed and the surgical site was marked right knee  Patient was taken to the operating room where she got 1.5 g of vancomycin  She had general anesthesia at her request  A Foley catheter was inserted  The right leg was prepped and draped sterilely  Timeout was completed  The limb was exsanguinated with a 6 inch Esmarch the tourniquet was elevated to 300 mmHg   Midline incision was made with the knee in extension subcutaneous tissue was divided down to the extensor mechanism.  A medial arthrotomy was performed the patella was everted and the fat pad was resected.  The medial soft tissue sleeve was elevated to the mid coronal plane  The lateral meniscus medial meniscus anterior horns were resected.  ACL and PCL were resected.  3/8 inch drill bit was used to enter the femoral canal intramedullary guide was placed at 5 degree valgus right knee 10 mm cut The block was pinned and the bone was removed.   We measured the bone and it was 9 mm this was accepted.  The tibia was brought forward and the remaining menisci remnants of the cruciate ligaments were resected.  The patella femoral ligament was released.  The tibial guide was brought in and set off the tibial spines and tibial tubercle and ankle joint with a 4 mm resection from the lateral side.  This bone was resected and the tibia was measured to have a 3 baseplate.  Extension gap was checked and found to be balanced and full extension obtained with a 10 spacer  We then measured the femur to a size 3.  We pinned the block for 3 femur and made the anterior and posterior cuts and then checked the flexion gap with a 10 spacer.  This was stable and balanced  We made the remaining chamfer cuts.  We made the box cut.  We did a trial we got good range of motion up to 125 degrees normal patellar tracking full extension stable ligaments.  We then punched the tibia  We then cut the patella from 23- 12 mm thickness and drilled for a 38 x 9 button  We then irrigated the joint injected the Marcaine and exparel while cement was prepared on the back table  We then cemented implants in place and while the cement was curing we removed any excess cement.  We irrigated again check for any remaining cement and removed it.  Passive flexion test was 120 degrees knee came to full extension was balanced in flexion extension  Wound was irrigated and then we began closure  We closed with a #1 Braylon for the extensor mechanism 0 Monocryl for the subcu tissue and staples to reapproximate the skin  The dressing and Cryo/Cuff were placed a Cryo/Cuff was activated  The patient was taken to recovery room in stable condition  PHYSICIAN ASSISTANT:   Corrie Dandy was the first assistant   We had a second assistant   General anesthesia   Estimated blood loss 10 cc   Tourniquet time 1 hour 22 minutes   Local medications used 30 cc of Marcaine with  epinephrine 20 cc of Exparel full-strength   No drains   Under needle counts were correct   Specimens no specimens   DICTATION: .Dragon Dictation  PLAN OF CARE: Admit to inpatient   PATIENT DISPOSITION:  PACU - hemodynamically stable.   Delay start of Pharmacological VTE agent (>24hrs) due to surgical blood loss or risk of bleeding: no

## 2019-04-02 NOTE — Op Note (Signed)
04/02/2019  9:36 AM  PATIENT:  Jennifer [name]  63 y.o. female  PRE-OPERATIVE DIAGNOSIS:  osteoarthritis right knee  POST-OPERATIVE DIAGNOSIS:  osteoarthritis right knee  Findings at surgery Supple valgus deformity Grade 4 wear of the entire lateral femoral condyle without hypoplasia grade 4 wear of the tibial plateau grade 2 wear of the patella and trochlea  Lateral meniscus degenerative but intact  Medial meniscus intact  ACL PCL intact  No flexion contracture under anesthesia in fact she had 5 degrees of hyperextension Preop flexion under anesthesia 125 degrees   PROCEDURE:  Procedure(s): TOTAL KNEE ARTHROPLASTY (Right)   DEPUY SIGMA FIXED BEARING PS  72F  3T 38 X 9 P 10 PS INSERT   Femoral cut distally 9 mm Tibial cut 4 mm from the lateral side Patella cut from 23 mm to 12 mm  SURGEON:  Surgeon(s) and Role:    Carole Civil, MD - Primary   Details of surgery  The patient was seen in preop the surgical site was confirmed his right knee chart review was completed and the surgical site was marked right knee  Patient was taken to the operating room where she got 1.5 g of vancomycin  She had general anesthesia at her request  A Foley catheter was inserted  The right leg was prepped and draped sterilely  Timeout was completed  The limb was exsanguinated with a 6 inch Esmarch the tourniquet was elevated to 300 mmHg   Midline incision was made with the knee in extension subcutaneous tissue was divided down to the extensor mechanism.  A medial arthrotomy was performed the patella was everted and the fat pad was resected.  The medial soft tissue sleeve was elevated to the mid coronal plane  The lateral meniscus medial meniscus anterior horns were resected.  ACL and PCL were resected.  3/8 inch drill bit was used to enter the femoral canal intramedullary guide was placed at 5 degree valgus right knee 10 mm cut The block was pinned and the bone was removed.   We measured the bone and it was 9 mm this was accepted.  The tibia was brought forward and the remaining menisci remnants of the cruciate ligaments were resected.  The patella femoral ligament was released.  The tibial guide was brought in and set off the tibial spines and tibial tubercle and ankle joint with a 4 mm resection from the lateral side.  This bone was resected and the tibia was measured to have a 3 baseplate.  Extension gap was checked and found to be balanced and full extension obtained with a 10 spacer  We then measured the femur to a size 3.  We pinned the block for 3 femur and made the anterior and posterior cuts and then checked the flexion gap with a 10 spacer.  This was stable and balanced  We made the remaining chamfer cuts.  We made the box cut.  We did a trial we got good range of motion up to 125 degrees normal patellar tracking full extension stable ligaments.  We then punched the tibia  We then cut the patella from 23- 12 mm thickness and drilled for a 38 x 9 button  We then irrigated the joint injected the Marcaine and exparel while cement was prepared on the back table  We then cemented implants in place and while the cement was curing we removed any excess cement.  We irrigated again check for any remaining cement and removed it.  Passive flexion test was 120 degrees knee came to full extension was balanced in flexion extension  Wound was irrigated and then we began closure  We closed with a #1 Braylon for the extensor mechanism 0 Monocryl for the subcu tissue and staples to reapproximate the skin  The dressing and Cryo/Cuff were placed a Cryo/Cuff was activated  The patient was taken to recovery room in stable condition  PHYSICIAN ASSISTANT:   Corrie Dandy was the first assistant   We had a second assistant   General anesthesia   Estimated blood loss 10 cc   Tourniquet time 1 hour 22 minutes   Local medications used 30 cc of Marcaine with  epinephrine 20 cc of Exparel full-strength   No drains   Under needle counts were correct   Specimens no specimens   DICTATION: .Dragon Dictation  PLAN OF CARE: Admit to inpatient   PATIENT DISPOSITION:  PACU - hemodynamically stable.   Delay start of Pharmacological VTE agent (>24hrs) due to surgical blood loss or risk of bleeding: no

## 2019-04-02 NOTE — Anesthesia Preprocedure Evaluation (Signed)
Anesthesia Evaluation  Patient identified by MRN, date of birth, ID band Patient awake    Reviewed: Allergy & Precautions, NPO status , Patient's Chart, lab work & pertinent test results, reviewed documented beta blocker date and time   Airway Mallampati: II  TM Distance: >3 FB Neck ROM: Full    Dental no notable dental hx. (+) Edentulous Upper, Edentulous Lower   Pulmonary COPD, Current SmokerPatient did not abstain from smoking.,    Pulmonary exam normal breath sounds clear to auscultation       Cardiovascular Exercise Tolerance: Poor hypertension, Pt. on medications and Pt. on home beta blockers + CAD, + Past MI and + Cardiac Stents  Normal cardiovascular examI Rhythm:Regular Rate:Normal  Reports one stent ~2018 States cleared by cardiology for this  Denies recent CP States Never uses NTG   Neuro/Psych  Neuromuscular disease CVA, No Residual Symptoms negative psych ROS   GI/Hepatic negative GI ROS, Neg liver ROS,   Endo/Other  diabetes, Type 2Hypothyroidism Morbid obesity  Renal/GU negative Renal ROS  negative genitourinary   Musculoskeletal negative musculoskeletal ROS (+)   Abdominal   Peds negative pediatric ROS (+)  Hematology negative hematology ROS (+)   Anesthesia Other Findings   Reproductive/Obstetrics negative OB ROS                             Anesthesia Physical Anesthesia Plan  ASA: IV  Anesthesia Plan: General   Post-op Pain Management:    Induction: Intravenous  PONV Risk Score and Plan: 3 and Ondansetron, Dexamethasone, Treatment may vary due to age or medical condition and Midazolam  Airway Management Planned: Oral ETT  Additional Equipment:   Intra-op Plan:   Post-operative Plan: Extubation in OR  Informed Consent: I have reviewed the patients History and Physical, chart, labs and discussed the procedure including the risks, benefits and alternatives  for the proposed anesthesia with the patient or authorized representative who has indicated his/her understanding and acceptance.     Dental advisory given  Plan Discussed with: CRNA  Anesthesia Plan Comments: (Plan Full PPE use Plan GETA D/W PT -WTP with same after Q&A   Offered SAB-refused  Offered Saphenous N. Block for POPM-refused )        Anesthesia Quick Evaluation

## 2019-04-03 ENCOUNTER — Encounter (HOSPITAL_COMMUNITY): Payer: Self-pay | Admitting: Orthopedic Surgery

## 2019-04-03 LAB — CBC
HCT: 35.6 % — ABNORMAL LOW (ref 36.0–46.0)
Hemoglobin: 10.9 g/dL — ABNORMAL LOW (ref 12.0–15.0)
MCH: 27.3 pg (ref 26.0–34.0)
MCHC: 30.6 g/dL (ref 30.0–36.0)
MCV: 89.2 fL (ref 80.0–100.0)
Platelets: 221 10*3/uL (ref 150–400)
RBC: 3.99 MIL/uL (ref 3.87–5.11)
RDW: 13.5 % (ref 11.5–15.5)
WBC: 9.4 10*3/uL (ref 4.0–10.5)
nRBC: 0 % (ref 0.0–0.2)

## 2019-04-03 LAB — BASIC METABOLIC PANEL
Anion gap: 8 (ref 5–15)
BUN: 28 mg/dL — ABNORMAL HIGH (ref 8–23)
CO2: 25 mmol/L (ref 22–32)
Calcium: 8.2 mg/dL — ABNORMAL LOW (ref 8.9–10.3)
Chloride: 105 mmol/L (ref 98–111)
Creatinine, Ser: 1.23 mg/dL — ABNORMAL HIGH (ref 0.44–1.00)
GFR calc Af Amer: 54 mL/min — ABNORMAL LOW (ref >60)
GFR calc non Af Amer: 47 mL/min — ABNORMAL LOW (ref >60)
Glucose, Bld: 161 mg/dL — ABNORMAL HIGH (ref 70–99)
Potassium: 4.1 mmol/L (ref 3.5–5.1)
Sodium: 138 mmol/L (ref 135–145)

## 2019-04-03 LAB — GLUCOSE, CAPILLARY
Glucose-Capillary: 156 mg/dL — ABNORMAL HIGH (ref 70–99)
Glucose-Capillary: 141 mg/dL — ABNORMAL HIGH (ref 70–99)
Glucose-Capillary: 137 mg/dL — ABNORMAL HIGH (ref 70–99)
Glucose-Capillary: 138 mg/dL — ABNORMAL HIGH (ref 70–99)

## 2019-04-03 MED ORDER — INSULIN PUMP
SUBCUTANEOUS | Status: DC
  Administered 2019-04-03 – 2019-04-04 (×3): via SUBCUTANEOUS
  Filled 2019-04-03: qty 1

## 2019-04-03 NOTE — Progress Notes (Signed)
Patient ID: Jennifer [name], female   DOB: 11/28/1955, 63 y.o.   MRN: [id number]  Postop day 1 status post right total knee  BP (!) 119/54 (BP Location: Left Arm)   Pulse 80   Temp 98 F (36.7 C) (Oral)   Resp 19   Ht 5\' 7"  (1.702 m)   Wt 121.1 kg   SpO2 93%   BMI 41.82 kg/m   Patient denies chest pain or shortness of breath O2 sat 93%, had oxygen last night seems to be doing well this morning  Dressing change today  Bandages were removed minimal scant sanguinous drainage on the dressing calf is soft and supple patient can flex her knee actively about 40 degrees  CBC Latest Ref Rng & Units 04/03/2019 03/29/2019 10/08/2018  WBC 4.0 - 10.5 K/uL 9.4 7.6 7.9  Hemoglobin 12.0 - 15.0 g/dL 10.9(L) 12.1 12.8  Hematocrit 36.0 - 46.0 % 35.6(L) 38.9 40.6  Platelets 150 - 400 K/uL 221 227 211    BMP Latest Ref Rng & Units 04/03/2019 03/29/2019 10/09/2018  Glucose 70 - 99 mg/dL 161(H) 100(H) 129(H)  BUN 8 - 23 mg/dL 28(H) 27(H) 18  Creatinine 0.44 - 1.00 mg/dL 1.23(H) 1.00 0.83  Sodium 135 - 145 mmol/L 138 138 141  Potassium 3.5 - 5.1 mmol/L 4.1 4.3 3.5  Chloride 98 - 111 mmol/L 105 105 107  CO2 22 - 32 mmol/L 25 25 26   Calcium 8.9 - 10.3 mg/dL 8.2(L) 8.5(L) 8.6(L)    Patient is on sliding insulin scale  Recommend physical therapy and possible discharge this afternoon

## 2019-04-03 NOTE — Progress Notes (Signed)
Pt arousable "but sleepy"; drifts back to sleep easily. HR 74, B/P 99/38, P-ox 96% on 2L [place], RR 12, deep and unlabored. Snoring at times. No apnea. Maintaing sats at 95-100%. No Pain. Dr. Aline Brochure notified with order to hold standing Ultram and Oxycodone as needed.

## 2019-04-03 NOTE — Progress Notes (Signed)
Pt  awake now. No pain. B/P 114/56, HR 90.  Pt has not voided since F/C D/C'd this am. Intake total 890 cc's. Bladder scanner results 36 cc. Pt does not feel like she needs to void. Dr. Aline Brochure notified to hold 1700 Coreg dose today. No new orders r/t no urine output this shift.

## 2019-04-03 NOTE — Progress Notes (Signed)
Patient ID: Jennifer [name], female   DOB: 1956/02/04, 63 y.o.   MRN: [id number] BP (!) 105/54   Pulse 80   Temp 98 F (36.7 C) (Oral)   Resp 19   Ht 5\' 7"  (1.702 m)   Wt 121.1 kg   SpO2 92%   BMI 41.53 kg/m   63 year old female with multiple medical problems  has a past medical history of CAD (coronary artery disease), Hypercholesteremia, Hypertension, Hypothyroidism, Stroke ([place]) (10/2018), Thyroid disease, and Type 2 diabetes mellitus ([place]) (10/08/2018).   Felt bad when she got up today we will extend stay and allow her to do some more therapy and for [place] to manage her blood pressure which was also low when she got up we held her medications.  Try discharge again tomorrow

## 2019-04-03 NOTE — Plan of Care (Signed)
  Problem: Acute Rehab PT Goals(only PT should resolve) Goal: Pt Will Go Supine/Side To Sit Outcome: Progressing Flowsheets (Taken 04/03/2019 1017) Pt will go Supine/Side to Sit: with modified independence Goal: Patient Will Transfer Sit To/From Stand Outcome: Progressing Flowsheets (Taken 04/03/2019 1017) Patient will transfer sit to/from stand:  with min guard assist  with modified independence Goal: Pt Will Transfer Bed To Chair/Chair To Bed Outcome: Progressing Flowsheets (Taken 04/03/2019 1017) Pt will Transfer Bed to Chair/Chair to Bed:  with modified independence  min guard assist Goal: Pt Will Ambulate Outcome: Progressing Flowsheets (Taken 04/03/2019 1017) Pt will Ambulate:  100 feet  with modified independence  with supervision Goal: Pt Will Go Up/Down Stairs Outcome: Progressing Flowsheets (Taken 04/03/2019 1017) Pt will Go Up / Down Stairs:  3-5 stairs  with modified independence  with minimal assist  10:19 AM, 04/03/19 Mearl Latin PT, DPT Physical Therapist at [facility]

## 2019-04-03 NOTE — Evaluation (Addendum)
Physical Therapy Evaluation Patient Details Name: Jennifer [name] MRN: [id number] DOB: Nov 02, 1955 Today's Date: 04/03/2019  RIGHT KNEE ROM: 3-80 degrees AMBULATION DISTANCE: 6 feet secondary to patient becoming exhausted and not feeling well    History of Present Illness  Jennifer [name] is a 63 year old female s/p R TKA following the diagnosis of osteoarthritis right knee.  Clinical Impression  Patient limited for functional mobility as stated below secondary to RLE weakness, fatigue and poor standing balance. Patient able to complete bed mobility, transfers, and ambulation as stated below. She is able to ambulate 6 feet before becoming exhausted and not feeling well and needs to return to seated. Patient educated on performing RLE exercises throughout day and post op precautions. CPM arrives during session and is set up for patient. RN notified and is instructed to set patient up when she returns to bed. Patient will benefit from continued physical therapy in hospital and recommended venue below to increase strength, balance, endurance for safe ADLs and gait. Patient set up on CPM, from 0-75 degrees with pain at baseline.     Follow Up Recommendations Home health PT    Equipment Recommendations  Rolling walker with 5" wheels(patient reports she has RW at home)    Recommendations for Other Services       Precautions / Restrictions Precautions Precautions: Fall Restrictions Weight Bearing Restrictions: Yes RLE Weight Bearing: Non weight bearing Other Position/Activity Restrictions: no pillows under R knee      Mobility  Bed Mobility Overal bed mobility: Modified Independent Bed Mobility: Supine to Sit     Supine to sit: Modified independent (Device/Increase time)     General bed mobility comments: slow, labored  Transfers Overall transfer level: Needs assistance Equipment used: Rolling walker (2 wheeled) Transfers: Sit to/from Stand Sit to Stand: Modified independent  (Device/Increase time);Mod assist         General transfer comment: slow, labored, requires mod assist and RW, requires verbal cueing for using UE with RW  Ambulation/Gait Ambulation/Gait assistance: Min assist;Modified independent (Device/Increase time) Gait Distance (Feet): 6 Feet Assistive device: Rolling walker (2 wheeled) Gait Pattern/deviations: Step-to pattern;Decreased stride length;Antalgic Gait velocity: decreased   General Gait Details: patient able to ambulate a few feet and needed to turn around secondary to exhaustion and not feeling well  Stairs            Wheelchair Mobility    Modified Rankin (Stroke Patients Only)       Balance Overall balance assessment: Needs assistance Sitting-balance support: Feet supported;No upper extremity supported Sitting balance-Leahy Scale: Good Sitting balance - Comments: at bedside   Standing balance support: Bilateral upper extremity supported Standing balance-Leahy Scale: Fair Standing balance comment: using RW                             Pertinent Vitals/Pain Pain Assessment: 0-10 Pain Score: 6 (pain with movement/ WB) Pain Location: R knee Pain Descriptors / Indicators: Aching Pain Intervention(s): Monitored during session;Premedicated before session    Home Living Family/patient expects to be discharged to:: Private residence Living Arrangements: Spouse/significant other Available Help at Discharge: Family Type of Home: House Home Access: Stairs to enter Entrance Stairs-Rails: Right;Left;Can reach both Entrance Stairs-Number of Steps: 5 Home Layout: One level Home Equipment: Cane - single point;Walker - 2 wheels;Bedside commode      Prior Function Level of Independence: Independent         Comments: community ambulator  Hand Dominance        Extremity/Trunk Assessment   Upper Extremity Assessment Upper Extremity Assessment: Overall WFL for tasks assessed    Lower  Extremity Assessment Lower Extremity Assessment: RLE deficits/detail RLE Deficits / Details: presentation consistant with post op TKA RLE: Unable to fully assess due to pain       Communication   Communication: No difficulties  Cognition Arousal/Alertness: Awake/alert Behavior During Therapy: WFL for tasks assessed/performed Overall Cognitive Status: Within Functional Limits for tasks assessed                                        General Comments      Exercises Total Joint Exercises Ankle Circles/Pumps: AROM;Supine;Right;15 reps Quad Sets: AROM;Right;15 reps;Supine Gluteal Sets: AROM;Supine;Right;15 reps Short Arc Quad: AROM;Supine;Right;10 reps Heel Slides: AROM;Supine;Right;10 reps Goniometric ROM: 3-45 AROM in supine, 3-80 seated   Assessment/Plan    PT Assessment Patient needs continued PT services  PT Problem List Decreased strength;Decreased activity tolerance;Decreased range of motion;Decreased balance;Decreased mobility;Pain;Obesity       PT Treatment Interventions DME instruction;Gait training;Functional mobility training;Stair training;Therapeutic activities;Therapeutic exercise;Balance training;Neuromuscular re-education;Patient/family education    PT Goals (Current goals can be found in the Care Plan section)  Acute Rehab PT Goals Patient Stated Goal: Return home with family PT Goal Formulation: With patient Time For Goal Achievement: 04/10/19 Potential to Achieve Goals: Good    Frequency 7X/week   Barriers to discharge        Co-evaluation               AM-PAC PT "6 Clicks" Mobility  Outcome Measure Help needed turning from your back to your side while in a flat bed without using bedrails?: None Help needed moving from lying on your back to sitting on the side of a flat bed without using bedrails?: A Little Help needed moving to and from a bed to a chair (including a wheelchair)?: A Little Help needed standing up from a  chair using your arms (e.g., wheelchair or bedside chair)?: A Little Help needed to walk in hospital room?: A Lot Help needed climbing 3-5 steps with a railing? : A Lot 6 Click Score: 17    End of Session Equipment Utilized During Treatment: Gait belt Activity Tolerance: Patient tolerated treatment well;Patient limited by fatigue Patient left: in chair;with call bell/phone within reach Nurse Communication: Mobility status;Other (comment)(use of CPM when patient returns to bed) PT Visit Diagnosis: Unsteadiness on feet (R26.81);Other abnormalities of gait and mobility (R26.89);Muscle weakness (generalized) (M62.81);Pain Pain - Right/Left: Right Pain - part of body: Knee    Time: [id number] PT Time Calculation (min) (ACUTE ONLY): 46 min   Charges:   PT Evaluation $PT Eval Low Complexity: 1 Low PT Treatments $Therapeutic Exercise: 8-22 mins $Therapeutic Activity: 8-22 mins        10:14 AM, 04/03/19 Mearl Latin PT, DPT Physical Therapist at [facility]

## 2019-04-03 NOTE — Progress Notes (Signed)
B/P  105/54, HR 76. Pt stable. Dr. Aline Brochure notified with order to hold 1000 Norvasc, Coreg, and Lasix.

## 2019-04-03 NOTE — TOC Initial Note (Signed)
Transition of Care [facility]) - Initial/Assessment Note    Patient Details  Name: Jennifer [name] MRN: [id number] Date of Birth: 1956-02-28  Transition of Care [facility]) CM/SW Contact:    Shade Flood, LCSW Phone Number: 04/03/2019, 12:05 PM  Clinical Narrative:                  Pt admitted after TKA. Pt observation status. Plan is for return home with [facility] PT. This was arranged prior to pt's surgery. PT has seen pt today and stated she is not yet ready for dc from their standpoint. Updated Tim at [place].  TOC will follow up tomorrow and will inform [facility] when pt ready for dc.  Expected Discharge Plan: [place] Barriers to Discharge: Continued Medical Work up   Patient Goals and CMS Choice        Expected Discharge Plan and Services Expected Discharge Plan: [place] In-house Referral: Clinical Social Work   Post Acute Care Choice: Museum/gallery conservator, Home Health                             HH Arranged: PT [place] Agency: [facility] Date [place]: 04/03/19 Time [place]: 1205 Representative spoke with at [place]: [place] Arrangements/Services   Lives with:: Spouse Patient language and need for interpreter reviewed:: Yes Do you feel safe going back to the place where you live?: Yes      Need for Family Participation in Patient Care: Yes (Comment) Care giver support system in place?: Yes (comment)   Criminal Activity/Legal Involvement Pertinent to Current Situation/Hospitalization: No - Comment as needed  Activities of Daily Living Home Assistive Devices/Equipment: CBG Meter, Dentures (specify type), Cane (specify quad or straight), Walker (specify type) ADL Screening (condition at time of admission) Patient's cognitive ability adequate to safely complete daily activities?: Yes Is the patient deaf or have difficulty hearing?: No Does the patient  have difficulty seeing, even when wearing glasses/contacts?: No Does the patient have difficulty concentrating, remembering, or making decisions?: No Patient able to express need for assistance with ADLs?: No Does the patient have difficulty dressing or bathing?: No Independently performs ADLs?: Yes (appropriate for developmental age) Does the patient have difficulty walking or climbing stairs?: No Weakness of Legs: None Weakness of Arms/Hands: None  Permission Sought/Granted                  Emotional Assessment Appearance:: Appears stated age Attitude/Demeanor/Rapport: Engaged Affect (typically observed): Calm Orientation: : Oriented to Place, Oriented to Self, Oriented to  Time, Oriented to Situation Alcohol / Substance Use: Not Applicable Psych Involvement: No (comment)  Admission diagnosis:  osteoarthritis right knee Patient Active Problem List   Diagnosis Date Noted  . Primary osteoarthritis of right knee 04/02/2019  . Unilateral primary osteoarthritis, right knee   . Pedal edema 02/25/2019  . Acute vertigo with vomiting and inability to stand 10/08/2018  . Type 2 diabetes mellitus ([place]) 10/08/2018  . Carotid artery disease ([place]) 03/15/2018  . Cerebrovascular accident (CVA) ([place]) 11/16/2017  . Morbid obesity ([place]) 11/09/2017  . Low back pain 10/26/2017  . Screening mammogram, encounter for 09/18/2017  . CAD S/P percutaneous coronary angioplasty 11/04/2015  . NSTEMI (non-ST elevated myocardial infarction) ([place]) 11/03/2015  . Hypothyroidism 07/06/2010  . Hyperlipidemia LDL goal <100 08/28/2008  . Essential hypertension 11/13/2007  . 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 Diabetic peripheral neuropathy ([place]) 09/21/2006  . TOBACCO ABUSE 09/21/2006  . Adjustment disorder with mixed anxiety and depressed mood 09/21/2006  . CARPAL TUNNEL SYNDROME 09/21/2006  . COPD  (chronic obstructive pulmonary disease) ([place]) 09/21/2006  . EDEMA 09/21/2006  . MIGRAINES, HX OF 09/21/2006   PCP:  Abner Greenspan, MD Pharmacy:   [place], [place] - [address] [place] [address] [phone] [place] [address] Phone: [phone] Fax: [phone]     Social Determinants of Health (SDOH) Interventions    Readmission Risk Interventions Readmission Risk Prevention Plan 10/09/2018  Medication Screening Complete  Transportation Screening Complete  Some recent data might be hidden

## 2019-04-04 ENCOUNTER — Inpatient Hospital Stay (HOSPITAL_COMMUNITY): Payer: 59

## 2019-04-04 DIAGNOSIS — J449 Chronic obstructive pulmonary disease, unspecified: Secondary | ICD-10-CM | POA: Diagnosis present

## 2019-04-04 DIAGNOSIS — Z8249 Family history of ischemic heart disease and other diseases of the circulatory system: Secondary | ICD-10-CM | POA: Diagnosis not present

## 2019-04-04 DIAGNOSIS — Z794 Long term (current) use of insulin: Secondary | ICD-10-CM | POA: Diagnosis not present

## 2019-04-04 DIAGNOSIS — Z87891 Personal history of nicotine dependence: Secondary | ICD-10-CM | POA: Diagnosis not present

## 2019-04-04 DIAGNOSIS — N179 Acute kidney failure, unspecified: Secondary | ICD-10-CM | POA: Diagnosis not present

## 2019-04-04 DIAGNOSIS — J9601 Acute respiratory failure with hypoxia: Secondary | ICD-10-CM | POA: Diagnosis not present

## 2019-04-04 DIAGNOSIS — I110 Hypertensive heart disease with heart failure: Secondary | ICD-10-CM | POA: Diagnosis present

## 2019-04-04 DIAGNOSIS — Z823 Family history of stroke: Secondary | ICD-10-CM | POA: Diagnosis not present

## 2019-04-04 DIAGNOSIS — M1711 Unilateral primary osteoarthritis, right knee: Secondary | ICD-10-CM | POA: Diagnosis present

## 2019-04-04 DIAGNOSIS — T402X5A Adverse effect of other opioids, initial encounter: Secondary | ICD-10-CM | POA: Diagnosis not present

## 2019-04-04 DIAGNOSIS — Z6841 Body Mass Index (BMI) 40.0 and over, adult: Secondary | ICD-10-CM | POA: Diagnosis not present

## 2019-04-04 DIAGNOSIS — I639 Cerebral infarction, unspecified: Secondary | ICD-10-CM | POA: Diagnosis not present

## 2019-04-04 DIAGNOSIS — Z79899 Other long term (current) drug therapy: Secondary | ICD-10-CM | POA: Diagnosis not present

## 2019-04-04 DIAGNOSIS — E11319 Type 2 diabetes mellitus with unspecified diabetic retinopathy without macular edema: Secondary | ICD-10-CM | POA: Diagnosis present

## 2019-04-04 DIAGNOSIS — I5032 Chronic diastolic (congestive) heart failure: Secondary | ICD-10-CM | POA: Diagnosis present

## 2019-04-04 DIAGNOSIS — Z8673 Personal history of transient ischemic attack (TIA), and cerebral infarction without residual deficits: Secondary | ICD-10-CM | POA: Diagnosis not present

## 2019-04-04 DIAGNOSIS — E7800 Pure hypercholesterolemia, unspecified: Secondary | ICD-10-CM | POA: Diagnosis present

## 2019-04-04 DIAGNOSIS — D62 Acute posthemorrhagic anemia: Secondary | ICD-10-CM | POA: Diagnosis not present

## 2019-04-04 DIAGNOSIS — G92 Toxic encephalopathy: Secondary | ICD-10-CM | POA: Diagnosis not present

## 2019-04-04 DIAGNOSIS — Z9861 Coronary angioplasty status: Secondary | ICD-10-CM | POA: Diagnosis not present

## 2019-04-04 DIAGNOSIS — I6523 Occlusion and stenosis of bilateral carotid arteries: Secondary | ICD-10-CM | POA: Diagnosis not present

## 2019-04-04 DIAGNOSIS — Z79890 Hormone replacement therapy (postmenopausal): Secondary | ICD-10-CM | POA: Diagnosis not present

## 2019-04-04 DIAGNOSIS — T501X5A Adverse effect of loop [high-ceiling] diuretics, initial encounter: Secondary | ICD-10-CM | POA: Diagnosis not present

## 2019-04-04 DIAGNOSIS — I2510 Atherosclerotic heart disease of native coronary artery without angina pectoris: Secondary | ICD-10-CM | POA: Diagnosis present

## 2019-04-04 DIAGNOSIS — E039 Hypothyroidism, unspecified: Secondary | ICD-10-CM | POA: Diagnosis present

## 2019-04-04 DIAGNOSIS — Z20828 Contact with and (suspected) exposure to other viral communicable diseases: Secondary | ICD-10-CM | POA: Diagnosis present

## 2019-04-04 LAB — BASIC METABOLIC PANEL
Anion gap: 9 (ref 5–15)
BUN: 42 mg/dL — ABNORMAL HIGH (ref 8–23)
CO2: 23 mmol/L (ref 22–32)
Calcium: 7.9 mg/dL — ABNORMAL LOW (ref 8.9–10.3)
Chloride: 104 mmol/L (ref 98–111)
Creatinine, Ser: 1.69 mg/dL — ABNORMAL HIGH (ref 0.44–1.00)
GFR calc Af Amer: 37 mL/min — ABNORMAL LOW (ref >60)
GFR calc non Af Amer: 32 mL/min — ABNORMAL LOW (ref >60)
Glucose, Bld: 148 mg/dL — ABNORMAL HIGH (ref 70–99)
Potassium: 4.3 mmol/L (ref 3.5–5.1)
Sodium: 136 mmol/L (ref 135–145)

## 2019-04-04 LAB — GLUCOSE, CAPILLARY
Glucose-Capillary: 100 mg/dL — ABNORMAL HIGH (ref 70–99)
Glucose-Capillary: 102 mg/dL — ABNORMAL HIGH (ref 70–99)
Glucose-Capillary: 118 mg/dL — ABNORMAL HIGH (ref 70–99)
Glucose-Capillary: 122 mg/dL — ABNORMAL HIGH (ref 70–99)
Glucose-Capillary: 123 mg/dL — ABNORMAL HIGH (ref 70–99)
Glucose-Capillary: 153 mg/dL — ABNORMAL HIGH (ref 70–99)

## 2019-04-04 LAB — CBC
HCT: 33.7 % — ABNORMAL LOW (ref 36.0–46.0)
Hemoglobin: 10.1 g/dL — ABNORMAL LOW (ref 12.0–15.0)
MCH: 27.2 pg (ref 26.0–34.0)
MCHC: 30 g/dL (ref 30.0–36.0)
MCV: 90.6 fL (ref 80.0–100.0)
Platelets: 194 10*3/uL (ref 150–400)
RBC: 3.72 MIL/uL — ABNORMAL LOW (ref 3.87–5.11)
RDW: 13.9 % (ref 11.5–15.5)
WBC: 9.6 10*3/uL (ref 4.0–10.5)
nRBC: 0 % (ref 0.0–0.2)

## 2019-04-04 MED ORDER — IPRATROPIUM-ALBUTEROL 0.5-2.5 (3) MG/3ML IN SOLN
3.0000 mL | Freq: Three times a day (TID) | RESPIRATORY_TRACT | Status: DC
  Administered 2019-04-04: 3 mL via RESPIRATORY_TRACT
  Filled 2019-04-04: qty 3

## 2019-04-04 MED ORDER — NALOXONE HCL 0.4 MG/ML IJ SOLN
INTRAMUSCULAR | Status: AC
  Administered 2019-04-04: 0.4 mg via INTRAVENOUS
  Filled 2019-04-04: qty 1

## 2019-04-04 MED ORDER — LISINOPRIL 10 MG PO TABS: 20.0000 mg | ORAL_TABLET | Freq: Every day | ORAL | Status: DC

## 2019-04-04 MED ORDER — SODIUM CHLORIDE 0.9 % IV BOLUS
500.0000 mL | Freq: Once | INTRAVENOUS | Status: AC
  Administered 2019-04-04: 500 mL via INTRAVENOUS

## 2019-04-04 MED ORDER — FUROSEMIDE 20 MG PO TABS: 10.0000 mg | ORAL_TABLET | Freq: Every day | ORAL | Status: DC

## 2019-04-04 MED ORDER — AMLODIPINE BESYLATE 5 MG PO TABS: 5.0000 mg | ORAL_TABLET | Freq: Every day | ORAL | Status: DC

## 2019-04-04 MED ORDER — NALOXONE HCL 0.4 MG/ML IJ SOLN
0.4000 mg | Freq: Once | INTRAMUSCULAR | Status: AC
  Administered 2019-04-04: 12:00:00 0.4 mg via INTRAVENOUS

## 2019-04-04 MED ORDER — SODIUM CHLORIDE 0.9 % IV SOLN
INTRAVENOUS | Status: DC
  Administered 2019-04-04: 16:00:00 via INTRAVENOUS

## 2019-04-04 MED ORDER — CARVEDILOL 3.125 MG PO TABS
6.2500 mg | ORAL_TABLET | Freq: Two times a day (BID) | ORAL | Status: DC
  Administered 2019-04-05 – 2019-04-06 (×4): 6.25 mg via ORAL
  Filled 2019-04-04 (×4): qty 2

## 2019-04-04 MED ORDER — LORAZEPAM 2 MG/ML IJ SOLN: 0.5000 mg | Freq: Four times a day (QID) | INTRAMUSCULAR | Status: DC | PRN

## 2019-04-04 MED ORDER — IPRATROPIUM-ALBUTEROL 0.5-2.5 (3) MG/3ML IN SOLN
3.0000 mL | Freq: Two times a day (BID) | RESPIRATORY_TRACT | Status: DC
  Administered 2019-04-05 – 2019-04-06 (×3): 3 mL via RESPIRATORY_TRACT
  Filled 2019-04-04 (×3): qty 3

## 2019-04-04 MED ORDER — INSULIN DETEMIR 100 UNIT/ML ~~LOC~~ SOLN
15.0000 [IU] | Freq: Two times a day (BID) | SUBCUTANEOUS | Status: DC
  Filled 2019-04-04 (×2): qty 0.15

## 2019-04-04 NOTE — Progress Notes (Signed)
Patient ID: Jennifer [name], female   DOB: [birth year], 63 y.o.   MRN: [id number] Pod 2   Post right tka   Issues:   Confusion to place an time   Weakness (cbg has been okay )   02 sat 88%  CBC Latest Ref Rng & Units 04/04/2019 04/03/2019 03/29/2019  WBC 4.0 - 10.5 K/uL 9.6 9.4 7.6  Hemoglobin 12.0 - 15.0 g/dL 10.1(L) 10.9(L) 12.1  Hematocrit 36.0 - 46.0 % 33.7(L) 35.6(L) 38.9  Platelets 150 - 400 K/uL 194 221 227   BMP Latest Ref Rng & Units 04/04/2019 04/03/2019 03/29/2019  Glucose 70 - 99 mg/dL 148(H) 161(H) 100(H)  BUN 8 - 23 mg/dL 42(H) 28(H) 27(H)  Creatinine 0.44 - 1.00 mg/dL 1.69(H) 1.23(H) 1.00  Sodium 135 - 145 mmol/L 136 138 138  Potassium 3.5 - 5.1 mmol/L 4.3 4.1 4.3  Chloride 98 - 111 mmol/L 104 105 105  CO2 22 - 32 mmol/L 23 25 25   Calcium 8.9 - 10.3 mg/dL 7.9(L) 8.2(L) 8.5(L)    I got a medical consult   Ekg, cxr   Delay discharge   PT can be done in the bed and the cpm can be used   Hold discharge

## 2019-04-04 NOTE — Progress Notes (Signed)
No change in LOC after Narcan given. Continue to monitor.

## 2019-04-04 NOTE — Progress Notes (Signed)
Pt's insulin pump became dislodged during bathing, and she does not have her insulin pump equipment. Pt is resting at this time. Will continue to monitor pt.

## 2019-04-04 NOTE — Progress Notes (Signed)
**Note De-identified  Obfuscation** EKG complete and placed in patient chart 

## 2019-04-04 NOTE — TOC Progression Note (Signed)
Transition of Care [facility]) - Progression Note    Patient Details  Name: Jennifer [name] MRN: [id number] Date of Birth: [birth year]  Transition of Care [facility]) CM/SW Contact  Boneta Lucks, RN Phone Number: 04/04/2019, 1:26 PM  Clinical Narrative:   PT reports patient is weak today, unable to walk or work with PT,  Husband states patient is sleepy, questioning pain medication.  Secure chat with Dr Adron Bene, he will go see patient and assess. TOC to follow.     Expected Discharge Plan: [place] Barriers to Discharge: Continued Medical Work up  Expected Discharge Plan and Services Expected Discharge Plan: [place] In-house Referral: Clinical Social Work   Post Acute Care Choice: Museum/gallery conservator, Home Health        HH Arranged: PT [place]: [facility] Date [place]: 04/03/19 Time [place]: 1205 Representative spoke with at [place]: [place] ([place]) Interventions    Readmission Risk Interventions Readmission Risk Prevention Plan 10/09/2018  Medication Screening Complete  Transportation Screening Complete  Some recent data might be hidden

## 2019-04-04 NOTE — Progress Notes (Signed)
Pt has not voided since cath removal on 04/03/2019 am. Pt was bladder scanned and the scan showed greater than 500cc of urine retained. MD notified. Verbal order was given to in and out cath pt. Pt was in and out cath and 1050cc was of yellow/clear urine was removed from pt. Pt tolerated the in and out procedure with no complications. Pt is resting at this time. Will continue to monitor pt.

## 2019-04-04 NOTE — Progress Notes (Signed)
Physical Therapy Treatment Patient Details Name: Jennifer [name] MRN: [id number] DOB: April 28, 1956 Today's Date: 04/04/2019    History of Present Illness Jennifer [name] is a 63 year old female s/p R TKA following the diagnosis of osteoarthritis right knee.    PT Comments    Patient continues to be very lethargic and confused during this afternoon's session. She is able to complete some of the exercises with frequent verbal and tactile cueing and demonstration but begins having difficulty staying alert to complete additional exercises. Patient is placed in CPM from 0-70 degrees because she was having increased pain beyond that point. She is told to increase the flexion angle by 10 degrees as tolerated. Patient left in bed on CPM - RN aware. Patient will benefit from continued physical therapy in hospital and recommended venue below to increase strength, balance, endurance for safe ADLs and gait.   Follow Up Recommendations  SNF     Equipment Recommendations  Rolling walker with 5" wheels    Recommendations for Other Services       Precautions / Restrictions Precautions Precautions: Fall Restrictions Weight Bearing Restrictions: Yes RLE Weight Bearing: Weight bearing as tolerated Other Position/Activity Restrictions: no pillows under R knee    Mobility  Bed Mobility Overal bed mobility: Needs Assistance                Transfers                    Ambulation/Gait                 Stairs             Wheelchair Mobility    Modified Rankin (Stroke Patients Only)       Balance                                            Cognition Arousal/Alertness: Lethargic;Suspect due to medications Behavior During Therapy: Flat affect Overall Cognitive Status: Impaired/Different from baseline                                        Exercises Total Joint Exercises Ankle Circles/Pumps: AROM;Supine;Right;15 reps Quad  Sets: AROM;Right;15 reps;Supine Gluteal Sets: AROM;Supine;Right;15 reps    General Comments        Pertinent Vitals/Pain Pain Assessment: 0-10 Pain Score: 10-Worst pain ever Pain Location: R knee    Home Living                      Prior Function            PT Goals (current goals can now be found in the care plan section) Acute Rehab PT Goals Patient Stated Goal: Return home with family PT Goal Formulation: With patient Time For Goal Achievement: 04/10/19 Potential to Achieve Goals: Good Progress towards PT goals: Progressing toward goals    Frequency    7X/week      PT Plan Current plan remains appropriate    Co-evaluation              AM-PAC PT "6 Clicks" Mobility   Outcome Measure  Help needed turning from your back to your side while in a flat bed without using bedrails?: A Little   Help needed moving  to and from a bed to a chair (including a wheelchair)?: A Lot Help needed standing up from a chair using your arms (e.g., wheelchair or bedside chair)?: A Lot Help needed to walk in hospital room?: Total Help needed climbing 3-5 steps with a railing? : Total 6 Click Score: 9    End of Session   Activity Tolerance: Patient tolerated treatment well;Patient limited by fatigue;Patient limited by lethargy Patient left: with call bell/phone within reach;with bed alarm set;in bed;in CPM Nurse Communication: Mobility status;Other (comment)(patient on CPM - told to remove in 2 hours) PT Visit Diagnosis: Unsteadiness on feet (R26.81);Other abnormalities of gait and mobility (R26.89);Muscle weakness (generalized) (M62.81);Pain Pain - Right/Left: Right     Time: [id number] PT Time Calculation (min) (ACUTE ONLY): 14 min  Charges:  $Therapeutic Exercise: 8-22 mins                     2:59 PM, 04/04/19 Mearl Latin PT, DPT Physical Therapist at [facility]

## 2019-04-04 NOTE — Progress Notes (Signed)
Pt continues to be  Drowsy. Received Ultram at 0545. Could not get up with PT. Became dizzy and nauseated. She is now seeing her daughter in the room and she is not here.  B/P is 114/52, HR 95,O2 Sat 88% on RA. On 2L via  with P-ox 96%, RR 12 and unlabored. Dr. Aline Brochure notified and came to bedside for assessment. Orders received. Narcan Given. Continue to monitor.

## 2019-04-04 NOTE — Consult Note (Signed)
Patient Demographics:    Jennifer [name], is a 63 y.o. 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, with long-term current use of insulin (HCC)   Diabetic peripheral neuropathy (HCC)   TOBACCO ABUSE   Essential hypertension   COPD (chronic obstructive pulmonary disease) (HCC)   CAD S/P percutaneous coronary angioplasty   Morbid obesity ([place])   Cerebrovascular accident (CVA) ([place])   Carotid artery disease ([place])   Type 2 diabetes mellitus ([place])   Unilateral primary osteoarthritis, right knee   Primary osteoarthritis of right knee  1)Acute Metabolic Encephalopathy--suspect opiate induced compounded by dehydration and AKI, stop tramadol, stop oxycodone, okay to continue Robaxin -Hydrate  2)AKI----acute kidney injury due to dehydration in the setting of poor oral intake compounded by lisinopril and Lasix use.     creatinine on admission= 1.23 ,   baseline creatinine = 0.8 to 1.0    , creatinine is now= 1.69     , renally adjust medications, avoid nephrotoxic agents/dehydration/hypotension -Discontinue lisinopril , stop Celebrex and Lasix -Hydrate IV and p.o.  3) acute hypoxic respiratory failure--- due to opiate-related sedation in a patient with underlying COPD and tobacco abuse -Oxygenation improved after Narcan -Give bronchodilators, supplemental oxygen as needed, -Incentive spirometry -Tramadol and oxycodone as been discontinued  4) COPD and tobacco abuse--- nicotine patch and bronchodilators as ordered  5)CAD-status post prior angioplasty and stent placement, stable, EKG is nonacute, continue  Lipitor, no aspirin as patient is on apixaban for DVT prophylaxis post knee surgery -Continue Coreg 6.25 mg twice daily  6)H/o PAD and Prior CVA-stable, apixaban and Lipitor as above #5, also on Zetia  7)DM2-insulin pump on hold okay to use sliding scale coverage at this time, continue linagliptin  8) hypothyroidism--stable continue levothyroxine 137 mcg  9)S/p right total knee replacement on 04/02/2019--further management per orthopedic team -Patient is on Eliquis 2.5 mg twice daily for DVT prophylaxis  10) acute blood loss anemia--- hemoglobin is down to 10.1, admission hemoglobin was 10.9, baseline around 12 monitor H&H closely especially while on Eliquis - 11)HFpEF--Echo from April 2020 with EF of 55 to 60% with first-degree diastolic dysfunction, patient with chronic diastolic dysfunction without acute exacerbation at this time -Be judicious with IV fluids.  Lasix on hold   With History of - Reviewed by me  Past Medical History:  Diagnosis Date  . CAD (coronary artery disease)    cath 5/23 100% dist RCA lesion treated with 2 overlapping Integrity Resolute DES ( 2.25x52mm, 2.25x70mm), 60% mid RCA treated medically, 99% OM2 not amenable to PCI, 70% D1 lesion, EF normal  . Hypercholesteremia   . Hypertension   . Hypothyroidism   . Stroke ([place]) 10/2018  . Thyroid disease   .  Type 2 diabetes mellitus ([place]) 10/08/2018      Past Surgical History:  Procedure Laterality Date  . ABDOMINAL HYSTERECTOMY    . CARDIAC CATHETERIZATION N/A 11/03/2015   Procedure: Left Heart Cath and Coronary Angiography;  Surgeon: Jennifer Man, MD;  Location: [facility];  Service: Cardiovascular;  Laterality: N/A;  . CARDIAC CATHETERIZATION N/A 11/03/2015   Procedure: Coronary Stent Intervention;  Surgeon: Jennifer Man, MD;  Location: [facility];  Service: Cardiovascular;  Laterality: N/A;  . CARPAL TUNNEL RELEASE    . CORONARY ANGIOPLASTY     1 stent  . TOTAL KNEE ARTHROPLASTY  Right 04/02/2019   Procedure: TOTAL KNEE ARTHROPLASTY;  Surgeon: Jennifer Civil, MD;  Location: [facility];  Service: Orthopedics;  Laterality: Right;   Confusion----hypoxia    HPI:    Jennifer [name]  is a 63 y.o. female  hx of NSTEMI/CAD (s/p PCI to RCA 2017), DM, HTN, CVA, HLD, hypothyroidism, COPD and on-going tobacco abuse right total knee replacement on 04/02/2019 for osteoarthritis--- -hospitalist medical consultation requested by Dr Jennifer [name] due to AKI, hypoxia and altered mentation  --Patient has been receiving tramadol and oxycodone, became confused, hypoxic and lethargic -Orthopedic surgeon Dr. Aline [name] gave patient Narcan  -O2 sats initially around 88% on room air , O2 sats is up to 91-92 patient on room air at this time  -No productive cough -No fevers, no chills, patient remains confused and lethargic No nausea, vomiting, diarrhea  -No chest pains, no palpitations, no dizziness  -additional h/o obtained from Husband Jennifer [name] [phone]) who is visiting pt  Also got additional information from patient's daughter Jennifer [name] by [phone]  -   Review of systems:    In addition to the HPI above,   A full Review of  Systems was done, all other systems reviewed are negative except as noted above in HPI , .    Social History:  Reviewed by me    Social History   Tobacco Use  . Smoking status: Former Smoker    Packs/day: 0.25    Types: Cigarettes    Quit date: 10/07/2018    Years since quitting: 0.4  . Smokeless tobacco: Never Used  Substance Use Topics  . Alcohol use: No    Alcohol/week: 0.0 standard drinks     Family History :  Reviewed by me    Family History  Problem Relation Age of Onset  . Heart disease Mother   . Stroke Sister   . Heart attack Brother      Home Medications:   Prior to Admission medications   Medication Sig Start Date End Date Taking? Authorizing Provider  amLODipine (NORVASC) 10 MG tablet Take 1 tablet (10 mg  total) by mouth daily for 30 days. 09/25/18 04/02/19 Yes [name], Jennifer Fanny, MD  aspirin EC 81 MG tablet Take 81 mg by mouth 2 (two) times daily.   Yes [provider]  atorvastatin (LIPITOR) 80 MG tablet Take 1 tablet (80 mg total) by mouth daily at 6 PM. Patient taking differently: Take 80 mg by mouth every morning.  03/15/18  Yes Erlene Quan, PA-C  buPROPion (WELLBUTRIN SR) 150 MG 12 hr tablet Take 1 tablet by mouth twice daily 02/21/19  Yes [name], Jennifer Fanny, MD  carvedilol (COREG) 12.5 MG tablet Take 1 tablet (12.5 mg total) by mouth 2 (two) times daily with a meal. 03/21/19  Yes Strader, [place] M, PA-C  DULoxetine (CYMBALTA) 60 MG capsule Take 1 capsule  by mouth once daily 09/25/18  Yes [name], Jennifer Fanny, MD  Ezetimibe (ZETIA PO) Take by mouth.   Yes [provider]  furosemide (LASIX) 20 MG tablet Take 1 tablet (20 mg total) by mouth daily. 01/03/19  Yes Strader, [place] M, PA-C  HYDROcodone-acetaminophen (NORCO/VICODIN) 5-325 MG tablet One tablet by mouth every six hours as needed for pain. Must last 30 days Patient taking differently: Take 1 tablet by mouth every 6 (six) hours as needed. One tablet by mouth every six hours as needed for pain. Must last 30 days 12/19/18  Yes Sanjuana Kava, MD  Insulin Human (INSULIN PUMP) SOLN Inject into the skin continuous. Patient may bolus up to 40 units at a time, with a maximum daily limit of 120 units   Yes [provider]  levothyroxine (SYNTHROID) 137 MCG tablet Take 137 mcg by mouth daily before breakfast.   Yes [provider]  lisinopril (PRINIVIL,ZESTRIL) 40 MG tablet Take 1 tablet (40 mg total) by mouth daily. 06/15/18  Yes [name], Jennifer Fanny, MD  meclizine (ANTIVERT) 25 MG tablet Take 1 tablet (25 mg total) by mouth 3 (three) times daily as needed for dizziness. 10/10/18  Yes Barton Dubois, MD  sitaGLIPtin (JANUVIA) 100 MG tablet Take 100 mg by mouth daily.    Yes [provider]  nitroGLYCERIN (NITROSTAT) 0.4 MG SL  tablet Place 1 tablet (0.4 mg total) under the tongue every 5 (five) minutes x 3 doses as needed for chest pain. 03/15/18   Erlene Quan, PA-C     Allergies:    No Known Allergies   Physical Exam:   Vitals  Blood pressure 105/80, pulse 86, temperature 98.6 F (37 C), temperature source Oral, resp. rate 12, height 5\' 7"  (1.702 m), weight 121.1 kg, SpO2 92 %.  Physical Examination: General appearance - confused,  , but in no distress  Mental status -lethargic and confused intermittently Eyes - sclera anicteric Nose- [place] 2 L/min Neck - supple, no JVD elevation , Chest -diminished in bases, no wheezing or rales  heart - S1 and S2 normal, regular  Abdomen - soft, nontender, nondistended,  Neurological -confused and disoriented, no new focal neuro deficit  Extremities -  intact peripheral pulses  Skin - warm, dry MSK-right knee postop wound clean dry intact    Data Review:    CBC Recent Labs  Lab 03/29/19 1026 04/03/19 0607 04/04/19 0614  WBC 7.6 9.4 9.6  HGB 12.1 10.9* 10.1*  HCT 38.9 35.6* 33.7*  PLT 227 221 194  MCV 87.6 89.2 90.6  MCH 27.3 27.3 27.2  MCHC 31.1 30.6 30.0  RDW 13.5 13.5 13.9  LYMPHSABS 1.4  --   --   MONOABS 0.5  --   --   EOSABS 0.1  --   --   BASOSABS 0.0  --   --    ------------------------------------------------------------------------------------------------------------------  Chemistries  Recent Labs  Lab 03/29/19 1026 04/03/19 0607 04/04/19 0614  NA 138 138 136  K 4.3 4.1 4.3  CL 105 105 104  CO2 25 25 23   GLUCOSE 100* 161* 148*  BUN 27* 28* 42*  CREATININE 1.00 1.23* 1.69*  CALCIUM 8.5* 8.2* 7.9*   ------------------------------------------------------------------------------------------------------------------ estimated creatinine clearance is 45.9 mL/min (A) (by C-G formula based on SCr of 1.69 mg/dL (H)). ------------------------------------------------------------------------------------------------------------------ No  results for input(s): TSH, T4TOTAL, T3FREE, THYROIDAB in the last 72 hours.  Invalid input(s): FREET3   Coagulation profile No results for input(s): INR, PROTIME in the last 168 hours. -------------------------------------------------------------------------------------------------------------------  No results for input(s): DDIMER in the last 72 hours.  Cardiac Enzymes No results for input(s): CKMB, TROPONINI, MYOGLOBIN in the last 168 hours.  Invalid input(s): CK ------------------------------------------------------------------------------------------------------------------    Component Value Date/Time   BNP 62.8 11/03/2015 2222   Urinalysis    Component Value Date/Time   COLORURINE YELLOW 10/10/2018 [place] 10/10/2018 0417   LABSPEC 1.024 10/10/2018 0417   PHURINE 6.0 10/10/2018 0417   GLUCOSEU 50 (A) 10/10/2018 0417   HGBUR NEGATIVE 10/10/2018 0417   BILIRUBINUR NEGATIVE 10/10/2018 0417   BILIRUBINUR Negative 10/26/2017 0911   KETONESUR 5 (A) 10/10/2018 0417   PROTEINUR >=300 (A) 10/10/2018 0417   UROBILINOGEN 0.2 10/26/2017 0911   NITRITE NEGATIVE 10/10/2018 0417   LEUKOCYTESUR NEGATIVE 10/10/2018 0417     Imaging Results:    Dg Chest Port 1 View  Result Date: 04/04/2019 CLINICAL DATA:  Confusion EXAM: PORTABLE CHEST 1 VIEW COMPARISON:  11/03/2015 FINDINGS: The heart size and mediastinal contours are within normal limits. Both lungs are clear. The visualized skeletal structures are unremarkable. IMPRESSION: No acute abnormality noted. Electronically Signed   By: Inez Catalina M.D.   On: 04/04/2019 14:49    Radiological Exams on Admission: Dg Chest Port 1 View  Result Date: 04/04/2019 CLINICAL DATA:  Confusion EXAM: PORTABLE CHEST 1 VIEW COMPARISON:  11/03/2015 FINDINGS: The heart size and mediastinal contours are within normal limits. Both lungs are clear. The visualized skeletal structures are unremarkable. IMPRESSION: No acute abnormality  noted. Electronically Signed   By: Inez Catalina M.D.   On: 04/04/2019 14:49   DVT Prophylaxis -SCD /eliquis AM Labs Ordered, also please review Full Orders  Family Communication: Admission, patients condition and plan of care including tests being ordered have been discussed with the patient and husband and daughter who indicate understanding and agree with the plan   Code Status - Full Code  Likely DC to  Home with [facility]   Condition   stable  Roxan Hockey M.D on 04/04/2019 at 3:53 PM Go to [email or web address] -  for contact info  [facility] - Office  [phone]

## 2019-04-04 NOTE — Progress Notes (Signed)
Physical Therapy Treatment Patient Details Name: Jennifer [name] MRN: [id number] DOB: September 27, 1955 Today's Date: 04/04/2019  RIGHT KNEE ROM: 3-84 degrees seated at bedside AMBULATION DISTANCE: 0 feet; patient unable to stand with RW and assist secondary to fatigue/ lethargic     History of Present Illness Jennifer [name] is a 63 year old female s/p R TKA following the diagnosis of osteoarthritis right knee.    PT Comments    Patient requires mod assist for bed mobility and feels nauseous upon transitioning to seated. She is unable to stand on multiple attempts with assist and states she feels very weak. She is able to partially stand with max assist and RW but is unable to maintain standing position and is returned to bed. Patient appears fatigued/lethargic throughout session. Patient is returned to bed - RN notified. Patient will benefit from continued physical therapy in hospital and recommended venue below to increase strength, balance, endurance for safe ADLs and gait.   Follow Up Recommendations  SNF     Equipment Recommendations  Rolling walker with 5" wheels(patient reports she has RW at home)    Recommendations for Other Services       Precautions / Restrictions Precautions Precautions: Fall Restrictions Weight Bearing Restrictions: Yes RLE Weight Bearing: Weight bearing as tolerated Other Position/Activity Restrictions: no pillows under R knee    Mobility  Bed Mobility Overal bed mobility: Needs Assistance Bed Mobility: Supine to Sit     Supine to sit: Mod assist     General bed mobility comments: slow, labored, required mod assist to seated at bedside  Transfers Overall transfer level: Needs assistance Equipment used: Rolling walker (2 wheeled) Transfers: Sit to/from Stand Sit to Stand: Max assist         General transfer comment: slow, labored, multiple attempts by PT to get standing with assist but patient stated she felt weak and couldn't get up but she  tried, minimal carry over to use BUE properly to assist, able to transition to partial standing with max assist and RW but unable to maintain  Ambulation/Gait                 Stairs             Wheelchair Mobility    Modified Rankin (Stroke Patients Only)       Balance Overall balance assessment: Needs assistance Sitting-balance support: Feet supported;Bilateral upper extremity supported Sitting balance-Leahy Scale: Good Sitting balance - Comments: at bedside   Standing balance support: Bilateral upper extremity supported Standing balance-Leahy Scale: Zero Standing balance comment: using RW and max assist                            Cognition Arousal/Alertness: Lethargic Behavior During Therapy: Flat affect Overall Cognitive Status: Within Functional Limits for tasks assessed                                        Exercises Total Joint Exercises Goniometric ROM: 3-84 seated    General Comments        Pertinent Vitals/Pain Pain Assessment: 0-10 Pain Score: 10-Worst pain ever Pain Location: R knee Pain Descriptors / Indicators: Aching Pain Intervention(s): Monitored during session    Home Living                      Prior Function  PT Goals (current goals can now be found in the care plan section) Acute Rehab PT Goals Patient Stated Goal: Return home with family PT Goal Formulation: With patient Time For Goal Achievement: 04/10/19 Potential to Achieve Goals: Good Progress towards PT goals: Progressing toward goals    Frequency    7X/week      PT Plan Current plan remains appropriate    Co-evaluation              AM-PAC PT "6 Clicks" Mobility   Outcome Measure  Help needed turning from your back to your side while in a flat bed without using bedrails?: A Little Help needed moving from lying on your back to sitting on the side of a flat bed without using bedrails?: A Lot Help  needed moving to and from a bed to a chair (including a wheelchair)?: A Lot Help needed standing up from a chair using your arms (e.g., wheelchair or bedside chair)?: A Lot Help needed to walk in hospital room?: Total Help needed climbing 3-5 steps with a railing? : Total 6 Click Score: 11    End of Session Equipment Utilized During Treatment: Gait belt Activity Tolerance: Patient tolerated treatment well;Patient limited by fatigue;Patient limited by lethargy Patient left: in chair;with call bell/phone within reach;with bed alarm set Nurse Communication: Mobility status PT Visit Diagnosis: Unsteadiness on feet (R26.81);Other abnormalities of gait and mobility (R26.89);Muscle weakness (generalized) (M62.81);Pain Pain - Right/Left: Right Pain - part of body: Knee     Time: 0821-0847 PT Time Calculation (min) (ACUTE ONLY): 26 min  Charges:  $Therapeutic Activity: 23-37 mins                    9:54 AM, 04/04/19 Mearl Latin PT, DPT Physical Therapist at [facility]

## 2019-04-04 NOTE — Progress Notes (Signed)
Inpatient Diabetes Program Recommendations  AACE/ADA: New Consensus Statement on Inpatient Glycemic Control (2015)  Target Ranges:  Prepandial:   less than 140 mg/dL      Peak postprandial:   less than 180 mg/dL (1-2 hours)      Critically ill patients:  140 - 180 mg/dL   Lab Results  Component Value Date   GLUCAP 123 (H) 04/04/2019   HGBA1C 6.5 (H) 03/29/2019    Review of Glycemic Control  Diabetes history: DM type 1  Outpatient Diabetes medications: Insulin pump Current orders for Inpatient glycemic control: insulin pump order set  Inpatient Diabetes Program Recommendations:    Glucose trends within inpatient goal.  Patient bolusing on insulin pump. Please d/c Novolog Correction scale.  Thanks,  Tama Headings RN, MSN, BC-ADM Inpatient Diabetes Coordinator Team Pager [phone] (8a-5p)

## 2019-04-04 NOTE — Progress Notes (Signed)
B/P is 134/41, HR 90's.  More awake and oriented x4-still seeing people that are not there. Dr. Denton Brick notified with order to hold Coreg r/t low diastolic.

## 2019-04-05 DIAGNOSIS — I2510 Atherosclerotic heart disease of native coronary artery without angina pectoris: Secondary | ICD-10-CM

## 2019-04-05 DIAGNOSIS — I6523 Occlusion and stenosis of bilateral carotid arteries: Secondary | ICD-10-CM

## 2019-04-05 DIAGNOSIS — Z9861 Coronary angioplasty status: Secondary | ICD-10-CM

## 2019-04-05 DIAGNOSIS — J449 Chronic obstructive pulmonary disease, unspecified: Secondary | ICD-10-CM

## 2019-04-05 LAB — GLUCOSE, CAPILLARY
Glucose-Capillary: 129 mg/dL — ABNORMAL HIGH (ref 70–99)
Glucose-Capillary: 142 mg/dL — ABNORMAL HIGH (ref 70–99)
Glucose-Capillary: 118 mg/dL — ABNORMAL HIGH (ref 70–99)
Glucose-Capillary: 153 mg/dL — ABNORMAL HIGH (ref 70–99)

## 2019-04-05 LAB — TYPE AND SCREEN
ABO/RH(D): A POS
Antibody Screen: NEGATIVE
Unit division: 0
Unit division: 0

## 2019-04-05 LAB — BASIC METABOLIC PANEL
Anion gap: 6 (ref 5–15)
BUN: 31 mg/dL — ABNORMAL HIGH (ref 8–23)
CO2: 24 mmol/L (ref 22–32)
Calcium: 8 mg/dL — ABNORMAL LOW (ref 8.9–10.3)
Chloride: 109 mmol/L (ref 98–111)
Creatinine, Ser: 1.14 mg/dL — ABNORMAL HIGH (ref 0.44–1.00)
GFR calc Af Amer: 59 mL/min — ABNORMAL LOW (ref >60)
GFR calc non Af Amer: 51 mL/min — ABNORMAL LOW (ref >60)
Glucose, Bld: 149 mg/dL — ABNORMAL HIGH (ref 70–99)
Potassium: 3.9 mmol/L (ref 3.5–5.1)
Sodium: 139 mmol/L (ref 135–145)

## 2019-04-05 LAB — BPAM RBC
Blood Product Expiration Date: 202011122359
Blood Product Expiration Date: 202011122359
Unit Type and Rh: 6200
Unit Type and Rh: 6200

## 2019-04-05 LAB — CBC
HCT: 31.9 % — ABNORMAL LOW (ref 36.0–46.0)
Hemoglobin: 9.8 g/dL — ABNORMAL LOW (ref 12.0–15.0)
MCH: 27.3 pg (ref 26.0–34.0)
MCHC: 30.7 g/dL (ref 30.0–36.0)
MCV: 88.9 fL (ref 80.0–100.0)
Platelets: 185 10*3/uL (ref 150–400)
RBC: 3.59 MIL/uL — ABNORMAL LOW (ref 3.87–5.11)
RDW: 13.8 % (ref 11.5–15.5)
WBC: 7.4 10*3/uL (ref 4.0–10.5)
nRBC: 0 % (ref 0.0–0.2)

## 2019-04-05 MED ORDER — METHOCARBAMOL 500 MG PO TABS: 500.0000 mg | ORAL_TABLET | Freq: Two times a day (BID) | ORAL | Status: DC | PRN

## 2019-04-05 MED ORDER — QUETIAPINE FUMARATE 25 MG PO TABS
25.0000 mg | ORAL_TABLET | Freq: Every day | ORAL | Status: DC
  Administered 2019-04-05: 25 mg via ORAL
  Filled 2019-04-05: qty 1

## 2019-04-05 MED ORDER — SENNOSIDES-DOCUSATE SODIUM 8.6-50 MG PO TABS
2.0000 | ORAL_TABLET | Freq: Two times a day (BID) | ORAL | Status: DC
  Administered 2019-04-05 – 2019-04-06 (×3): 2 via ORAL
  Filled 2019-04-05 (×3): qty 2

## 2019-04-05 MED ORDER — METHOCARBAMOL 500 MG PO TABS
500.0000 mg | ORAL_TABLET | Freq: Three times a day (TID) | ORAL | Status: DC
  Administered 2019-04-05: 500 mg via ORAL
  Filled 2019-04-05: qty 1

## 2019-04-05 MED ORDER — HYDROCODONE-ACETAMINOPHEN 5-325 MG PO TABS: 1.0000 | ORAL_TABLET | Freq: Four times a day (QID) | ORAL | Status: DC | PRN

## 2019-04-05 NOTE — TOC Progression Note (Signed)
Transition of Care [facility]) - Progression Note    Patient Details  Name: Jennifer [name] MRN: [id number] Date of Birth: Jul 28, 1955  [facility] CM/SW Contact  Boneta Lucks, RN Phone Number: 04/05/2019, 1:53 PM  Clinical Narrative:   Dr Aline Brochure and Dr Denton Brick asking CM to speak with the family again.  Patient is not walking. We all agree she needs SNF.  I spoke with [name] RN- her daughter she works for [employer] and they do not want her in SNF due to [place]. Advised the concerns MD have with her going home.  CM read the PT note to [facility]. She states she will have PT meeting them at the home tomorrow when patient gets home.  They will have two plus people in the home at all times. MD's updated. Plan for patient to discharge home tomorrow with [place]    Expected Discharge Plan: [place] Barriers to Discharge: Continued Medical Work up  Expected Discharge Plan and Services Expected Discharge Plan: [place] In-house Referral: Clinical Social Work   Post Acute Care Choice: Durable Medical Equipment, Home Health      HH Arranged: PT [facility] Agency: [place] ([facility]) Date HH Agency Contacted: 04/05/19 Time [place]: 1257 Representative spoke with at [place]: [place] ([place]) Interventions    Readmission Risk Interventions Readmission Risk Prevention Plan 10/09/2018  Medication Screening Complete  Transportation Screening Complete  Some recent data might be hidden

## 2019-04-05 NOTE — TOC Progression Note (Signed)
Transition of Care [facility]) - Progression Note    Patient Details  Name: Jennifer [name] MRN: [id number] Date of Birth: Jan 07, 1956  Transition of Care [facility]) CM/SW Contact  Boneta Lucks, RN Phone Number: 04/05/2019, 12:57 PM  Clinical Narrative:   Patient to discharge over the weekend.  Melissa with [facility] called.  Patient's daughter works at home health and wants [facility] for PT. Melissa had already ran insurance.  Called Tim with [facility] to make him aware family is requesting [facility].      Expected Discharge Plan: [place] Barriers to Discharge: Continued Medical Work up  Expected Discharge Plan and Services Expected Discharge Plan: [place] In-house Referral: Clinical Social Work   Post Acute Care Choice: Durable Medical Equipment, Home Health       HH Arranged: PT [place]: [place] ([facility]) Date [place]: 04/05/19 Time [place]: 1257 Representative spoke with at [place]: [place] ([place]) Interventions    Readmission Risk Interventions Readmission Risk Prevention Plan 10/09/2018  Medication Screening Complete  Transportation Screening Complete  Some recent data might be hidden

## 2019-04-05 NOTE — Progress Notes (Signed)
Patient ID: Jennifer [name], female   DOB: 08/19/1955, 63 y.o.   MRN: [id number] POD 3   RT TKA  IF TOLERATES PT THEN HOME IF NOT CONSIDER SNF

## 2019-04-05 NOTE — TOC Progression Note (Deleted)
Hospice Choices  [facility] Ownership: For-Profit Date Certified: [id number]) [place] Ownership: For-Profit Date Certified: 0000000) [place] Ownership: Non-Profit Date Certified: [id number]) [place] Ownership: Non-Profit Date Certified: [id number]) [phone]   [facility] Ownership: Non-Profit Date Certified: 0000000) [phone]

## 2019-04-05 NOTE — Progress Notes (Signed)
Physical Therapy Treatment Patient Details Name: Jennifer [name] MRN: [id number] DOB: 1955/07/02 Today's Date: 04/05/2019  RIGHT KNEE ROM: 3-85 degrees AMBULATION DISTANCE: 0 feet, patient able to stand x2 with max assist and remain standing for about 10 seconds before she needed to sit     History of Present Illness Jennifer [name] is a 63 year old female s/p R TKA following the diagnosis of osteoarthritis right knee.    PT Comments    Patient demonstrates improved cognitive status and awareness today. She is able to complete exercises in bed without verbal cueing. She continues to require mod assist to transition to seated EOB. Patient requires max assist to transfer to standing twice but is unable to remain standing for longer than 10 seconds each time secondary to weakness and impaired activity tolerance. She requires frequent verbal cueing for sequencing of sit to stand transfer. Patient returns to supine and set up in CPM from 0-80 degrees - RN aware and notified to remove in 2 hours. Patient will benefit from continued physical therapy in hospital and recommended venue below to increase strength, balance, endurance for safe ADLs and gait.     Follow Up Recommendations  SNF     Equipment Recommendations  Rolling walker with 5" wheels    Recommendations for Other Services       Precautions / Restrictions Precautions Precautions: Fall Restrictions Weight Bearing Restrictions: Yes RLE Weight Bearing: Weight bearing as tolerated    Mobility  Bed Mobility Overal bed mobility: Needs Assistance Bed Mobility: Supine to Sit     Supine to sit: Mod assist     General bed mobility comments: slow, labored, required mod assist to seated at bedside  Transfers Overall transfer level: Needs assistance Equipment used: Rolling walker (2 wheeled) Transfers: Sit to/from Stand Sit to Stand: Max assist         General transfer comment: slow, labored, multiple attemps to stand  patient failed because pt stated she was too weak. Able to stand patient x2 with max assist, patient able to tolerate standing for 10 seconds before saying she felt to week and needed to sit. Frequent verbal and tactile cueing for sequencing of transfer, minimal carry over to use of BUE for assist  Ambulation/Gait                 Stairs             Wheelchair Mobility    Modified Rankin (Stroke Patients Only)       Balance Overall balance assessment: Needs assistance Sitting-balance support: Feet supported;Bilateral upper extremity supported Sitting balance-Leahy Scale: Good Sitting balance - Comments: at bedside   Standing balance support: Bilateral upper extremity supported Standing balance-Leahy Scale: Zero Standing balance comment: using RW and max assist to remain standing, not able to stand for longer than 10 seconds                            Cognition Arousal/Alertness: Awake/alert Behavior During Therapy: Flat affect;WFL for tasks assessed/performed Overall Cognitive Status: Within Functional Limits for tasks assessed                                        Exercises Total Joint Exercises Ankle Circles/Pumps: AROM;Supine;Right;15 reps Quad Sets: AROM;Right;15 reps;Supine Gluteal Sets: AROM;Supine;Right;15 reps Heel Slides: AROM;Supine;Right;10 reps Goniometric ROM: 3-85 seated  General Comments        Pertinent Vitals/Pain Pain Assessment: No/denies pain Pain Location: R knee with moveement Pain Descriptors / Indicators: Aching Pain Intervention(s): RN gave pain meds during session    Home Living                      Prior Function            PT Goals (current goals can now be found in the care plan section) Acute Rehab PT Goals Patient Stated Goal: Return home with family PT Goal Formulation: With patient Time For Goal Achievement: 04/10/19 Potential to Achieve Goals: Good Progress towards PT  goals: Progressing toward goals    Frequency    7X/week      PT Plan Current plan remains appropriate    Co-evaluation              AM-PAC PT "6 Clicks" Mobility   Outcome Measure  Help needed turning from your back to your side while in a flat bed without using bedrails?: A Little Help needed moving from lying on your back to sitting on the side of a flat bed without using bedrails?: A Lot Help needed moving to and from a bed to a chair (including a wheelchair)?: A Lot Help needed standing up from a chair using your arms (e.g., wheelchair or bedside chair)?: A Lot Help needed to walk in hospital room?: Total Help needed climbing 3-5 steps with a railing? : Total 6 Click Score: 11    End of Session Equipment Utilized During Treatment: Gait belt;Oxygen Activity Tolerance: Patient tolerated treatment well;Patient limited by lethargy Patient left: with call bell/phone within reach;in bed;in CPM Nurse Communication: Mobility status;Other (comment)(patient on CPM - told to remove in 2 hours) PT Visit Diagnosis: Unsteadiness on feet (R26.81);Other abnormalities of gait and mobility (R26.89);Muscle weakness (generalized) (M62.81);Pain Pain - Right/Left: Right Pain - part of body: Knee     Time: [id number] PT Time Calculation (min) (ACUTE ONLY): 35 min  Charges:  $Therapeutic Exercise: 8-22 mins $Therapeutic Activity: 8-22 mins                     9:54 AM, 04/05/19 Mearl Latin PT, DPT Physical Therapist at [facility]

## 2019-04-05 NOTE — Progress Notes (Signed)
Patient ID: Jennifer [name], female   DOB: 11-Nov-1955, 63 y.o.   MRN: [id number] PT notes have been checked.  Patient still not able to go to home for rehab.  However, I would like another therapy visit for her today and 1 in the morning and if she is not able to go home will discharge to skilled care tomorrow morning

## 2019-04-05 NOTE — Progress Notes (Signed)
At present time: Confusion noted after Robaxin given at 1330.  Alert/Oriented x2, disoriented to place and time, and seeing a cat under her pillow and people that are not in the room. Dr. Denton Brick notified with order to d/c Robaxin.

## 2019-04-05 NOTE — Progress Notes (Signed)
Patient Demographics:    Jennifer [name], is a 63 y.o. female, DOB - Apr 11, 1956, MA:[id number]  Admit date - 04/02/2019   Admitting Physician Carole Civil, MD  Outpatient Primary MD for the patient is Jennifer [name], Jennifer Fanny, MD  LOS - 1   No chief complaint on file.       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 female  hx of NSTEMI/CAD (s/p PCI to RCA 2017), DM, HTN, CVA, HLD, hypothyroidism, COPD and on-going tobacco abuse right total knee replacement on 04/02/2019 for osteoarthritis---   -hospitalist medical consultation requested by Dr Aline Brochure due to AKI, hypoxia and altered mentation  A/p 1)Acute Metabolic Encephalopathy--suspect opiate induced compounded by dehydration and AKI, stopped tramadol, stopped oxycodone, okay to continue Robaxin -Mental status appears to be back to normal/baseline according to patient's husband  2)AKI----acute kidney injury due to dehydration in the setting of poor oral intake compounded by lisinopril and Lasix use.     creatinine on admission= 1.23 ,   baseline creatinine = 0.8 to 1.0    , creatinine is now= 1.1 (peak was 1.69) , renally adjust medications, avoid nephrotoxic  agents/dehydration/hypotension -Discontinue lisinopril , stop Celebrex and Lasix --Decrease IV fluids  3) acute hypoxic respiratory failure--- due to opiate-related sedation in a patient with underlying COPD and tobacco abuse -Oxygenation improved after Narcan -c/n bronchodilators,  -Wean off supplemental oxygen -Incentive spirometry -Tramadol and oxycodone as been discontinued -Hypoxia resolving, respiratory status has improved significantly after stopping tramadol and oxycodone  4) COPD and tobacco abuse--- nicotine patch and bronchodilators as ordered  5)CAD-status post prior angioplasty and stent placement, stable, EKG is nonacute, continue Lipitor, no aspirin as patient is on apixaban for DVT prophylaxis post knee surgery -Continue Coreg 6.25 mg twice daily  6)H/o PAD and Prior CVA-stable, apixaban and Lipitor as above #5, also on Zetia  7)DM2-insulin pump on hold  -okay to use sliding scale coverage at this time, continue linagliptin  8) hypothyroidism--stable continue levothyroxine 137 mcg  9)S/p right total knee replacement on 04/02/2019--further management per orthopedic team -Patient is on Eliquis 2.5 mg twice daily for DVT prophylaxis  10) acute blood loss anemia--- hemoglobin is down to 9.8 with hydration,  admission hemoglobin was 10.9, baseline around 12 monitor H&H closely especially while on Eliquis -No evidence of active bleeding at this time  11)HFpEF--Echo from April 2020 with EF of 55 to 60% with first-degree diastolic dysfunction, patient with chronic diastolic dysfunction without acute exacerbation at this time -Be judicious with IV fluids.  Lasix on hold  12) generalized weakness/deconditioning--status post right total knee replacement -Physical therapist recommends SNF rehab -Patient and family yet to decide, they are considering going home with home health  Disposition/Need for in-Hospital Stay- patient unable to be discharged at this time due to  --- possible discharge in the next 24 hours home Vs SNF  Code Status : Full Code  Family Communication:   NA (patient is alert, awake and coherent) -Discussed with husband- [phone]  Disposition Plan  : Home with home health versus SNF rehab  Consults  : Orthopedic surgery  DVT Prophylaxis  : Apixaban  Lab Results  Component Value Date   PLT 185 04/05/2019    Inpatient Medications  Scheduled Meds:  apixaban  2.5 mg Oral Q12H   atorvastatin  80 mg Oral q morning - 10a   buPROPion  150 mg Oral BID   carvedilol  6.25 mg Oral BID WC   DULoxetine  60 mg Oral Daily   ezetimibe  10 mg Oral Daily   insulin aspart  0-9 Units Subcutaneous TID WC   ipratropium-albuterol  3 mL Nebulization BID   levothyroxine  137 mcg Oral QAC breakfast   linagliptin  5 mg Oral Daily   ondansetron (ZOFRAN) IV  4 mg Intravenous Q6H   polyethylene glycol  17 g Oral Daily   Continuous Infusions:  sodium chloride 100 mL/hr at 04/04/19 1605   methocarbamol (ROBAXIN) IV     PRN Meds:.alum & mag hydroxide-simeth, bisacodyl, diphenhydrAMINE, LORazepam, magnesium citrate, meclizine, menthol-cetylpyridinium **OR** phenol, methocarbamol **OR** methocarbamol (ROBAXIN) IV, metoCLOPramide **OR** metoCLOPramide (REGLAN) injection, nitroGLYCERIN, ondansetron **OR** ondansetron (ZOFRAN) IV    Anti-infectives (From admission, onward)   Start     Dose/Rate Route Frequency Ordered Stop   04/02/19 2000  vancomycin (VANCOCIN) IVPB 1000 mg/200 mL premix     1,000 mg 200 mL/hr over 60 Minutes Intravenous Every 12 hours 04/02/19 1604 04/02/19 2113   04/02/19 0630  vancomycin (VANCOCIN) 1,500 mg in sodium chloride 0.9 % 500 mL IVPB     1,500 mg 250 mL/hr over 120 Minutes Intravenous On call to O.R. 04/02/19 [id number] 04/02/19 0818   04/02/19 0630  vancomycin (VANCOCIN) IVPB 1000 mg/200 mL premix  Status:  Discontinued     1,000 mg 200 mL/hr over 60 Minutes Intravenous On call to O.R. 04/02/19 0626 04/02/19  0627        Objective:   Vitals:   04/04/19 2045 04/04/19 2135 04/05/19 0518 04/05/19 0815  BP:  139/69 133/64   Pulse:  93 91   Resp:  17 17   Temp:  98.3 F (36.8 C) 98.3 F (36.8 C)   TempSrc:   Oral   SpO2: 90% 95% 95% 96%  Weight:      Height:        Wt Readings from Last 3 Encounters:  04/02/19 121.1 kg  04/01/19 121.1 kg  03/29/19 121.6 kg     Intake/Output Summary (Last 24 hours) at 04/05/2019 1032 Last data filed at 04/05/2019 0900 Gross per 24 hour  Intake 1613.61 ml  Output 1750 ml  Net -136.39 ml     Physical Exam  Gen:- Awake Alert, in no acute distress HEENT:- [place].AT, No sclera icterus Neck-Supple Neck,No JVD,.  Lungs-  CTAB , fair symmetrical air movement CV- S1, S2 normal, regular  Abd-  +ve B.Sounds, Abd Soft, No tenderness,    Extremity/Skin:- No  edema, pedal pulses present  Psych-affect is appropriate, oriented x3, patient is completely coherent  Neuro-no new focal deficits,  no tremors MSK-right knee postop wound clean dry intact   Data Review:   Micro Results Recent Results (from the past 240 hour(s))  SARS CORONAVIRUS 2 (TAT 6-24 HRS) Nasopharyngeal Nasopharyngeal Swab     Status: None   Collection Time: 03/29/19  7:00 AM   Specimen: Nasopharyngeal Swab  Result Value Ref Range Status   SARS Coronavirus 2 NEGATIVE NEGATIVE Final    Comment: (NOTE) SARS-CoV-2 target nucleic acids are NOT DETECTED. The SARS-CoV-2 RNA is generally detectable in upper and lower respiratory specimens during the acute phase of infection. Negative results do not preclude SARS-CoV-2 infection, do not rule out co-infections with other pathogens, and should not be used as the sole basis for treatment or other patient management decisions. Negative results must be combined with clinical observations, patient history, and epidemiological information. The expected result is Negative. Fact Sheet for Patients: SugarRoll[employer]be Fact  Sheet for Healthcare Providers: https://www.woods-mathews.com/ This test is not yet approved or cleared by the [place] FDA and  has been authorized for detection and/or diagnosis of SARS-CoV-2 by FDA under an Emergency Use Authorization (EUA). This EUA will remain  in effect (meaning this test can be used) for the duration of the COVID-19 declaration under Section 56 4(b)(1) of the Act, 21 U.S.C. section 360bbb-3(b)(1), unless the authorization is terminated or revoked sooner. Performed at [facility], [place] [address] [place], [place] [address]   Surgical pcr screen     Status: None   Collection Time: 03/29/19 10:26 AM   Specimen: Nasal Mucosa; Nasal Swab  Result Value Ref Range Status   MRSA, PCR NEGATIVE NEGATIVE Final   Staphylococcus aureus NEGATIVE NEGATIVE Final    Comment: (NOTE) The Xpert SA Assay (FDA approved for NASAL specimens in patients 9 years of age and older), is one component of a comprehensive surveillance program. It is not intended to diagnose infection nor to guide or monitor treatment. Performed at [facility], [address] [place], [place] [address]     Radiology Reports Dg Chest [place] 1 View  Result Date: 04/04/2019 CLINICAL DATA:  Confusion EXAM: PORTABLE CHEST 1 VIEW COMPARISON:  11/03/2015 FINDINGS: The heart size and mediastinal contours are within normal limits. Both lungs are clear. The visualized skeletal structures are unremarkable. IMPRESSION: No acute abnormality noted. Electronically Signed   By: Inez Catalina M.D.   On: 04/04/2019 14:49   Dg Knee Right Port  Result Date: 04/02/2019 CLINICAL DATA:  Right knee replacement EXAM: PORTABLE RIGHT KNEE - 1-2 VIEW COMPARISON:  02/27/2019, 08/01/2018 FINDINGS: Changes of right knee replacement. No hardware or bony complicating feature. Soft tissue and joint space gas noted. IMPRESSION: Right knee replacement.  No complicating feature. Electronically Signed   By: Rolm Baptise  M.D.   On: 04/02/2019 10:01     CBC Recent Labs  Lab 04/03/19 0607 04/04/19 0614 04/05/19 0615  WBC 9.4 9.6 7.4  HGB 10.9* 10.1* 9.8*  HCT 35.6* 33.7* 31.9*  PLT 221 194 185  MCV 89.2 90.6 88.9  MCH 27.3 27.2 27.3  MCHC 30.6 30.0 30.7  RDW 13.5 13.9 13.8    Chemistries  Recent Labs  Lab 04/03/19 0607 04/04/19 0614 04/05/19 0615  NA 138 136 139  K 4.1 4.3 3.9  CL 105 104 109  CO2 25 23 24   GLUCOSE 161* 148* 149*  BUN 28* 42* 31*  CREATININE 1.23* 1.69* 1.14*  CALCIUM 8.2* 7.9* 8.0*   ------------------------------------------------------------------------------------------------------------------ No results for input(s): CHOL, HDL, LDLCALC, TRIG, CHOLHDL, LDLDIRECT  in the last 72 hours.  Lab Results  Component Value Date   HGBA1C 6.5 (H) 03/29/2019   ------------------------------------------------------------------------------------------------------------------ No results for input(s): TSH, T4TOTAL, T3FREE, THYROIDAB in the last 72 hours.  Invalid input(s): FREET3 ------------------------------------------------------------------------------------------------------------------ No results for input(s): VITAMINB12, FOLATE, FERRITIN, TIBC, IRON, RETICCTPCT in the last 72 hours.  Coagulation profile No results for input(s): INR, PROTIME in the last 168 hours.  No results for input(s): DDIMER in the last 72 hours.  Cardiac Enzymes No results for input(s): CKMB, TROPONINI, MYOGLOBIN in the last 168 hours.  Invalid input(s): CK ------------------------------------------------------------------------------------------------------------------    Component Value Date/Time   BNP 62.8 11/03/2015 2222    Yiselle Babich M.D on 04/05/2019 at 10:32 AM  Go to [email or web address] - for contact info  [facility] - Office  [phone]

## 2019-04-06 LAB — GLUCOSE, CAPILLARY
Glucose-Capillary: 130 mg/dL — ABNORMAL HIGH (ref 70–99)
Glucose-Capillary: 149 mg/dL — ABNORMAL HIGH (ref 70–99)
Glucose-Capillary: 116 mg/dL — ABNORMAL HIGH (ref 70–99)

## 2019-04-06 MED ORDER — NALOXONE HCL 0.4 MG/ML IJ SOLN
0.4000 mg | INTRAMUSCULAR | Status: DC | PRN
  Administered 2019-04-06: 0.4 mg via INTRAVENOUS
  Filled 2019-04-06: qty 1

## 2019-04-06 MED ORDER — ONDANSETRON HCL 4 MG PO TABS: 4.0000 mg | ORAL_TABLET | Freq: Four times a day (QID) | ORAL | 0 refills | Status: DC | PRN

## 2019-04-06 MED ORDER — ASPIRIN EC 81 MG PO TBEC: 81.0000 mg | DELAYED_RELEASE_TABLET | Freq: Two times a day (BID) | ORAL | 0 refills | Status: DC

## 2019-04-06 MED ORDER — HYDROCODONE-ACETAMINOPHEN 5-325 MG PO TABS
1.0000 | ORAL_TABLET | Freq: Once | ORAL | Status: AC
  Administered 2019-04-06: 1 via ORAL
  Filled 2019-04-06: qty 1

## 2019-04-06 MED ORDER — SENNOSIDES-DOCUSATE SODIUM 8.6-50 MG PO TABS: 2.0000 | ORAL_TABLET | Freq: Every day | ORAL | 2 refills | Status: DC

## 2019-04-06 MED ORDER — AMLODIPINE BESYLATE 10 MG PO TABS: 10.0000 mg | ORAL_TABLET | Freq: Every day | ORAL | 5 refills | Status: DC

## 2019-04-06 MED ORDER — APIXABAN 2.5 MG PO TABS: 2.5000 mg | ORAL_TABLET | Freq: Two times a day (BID) | ORAL | 0 refills | Status: DC

## 2019-04-06 MED ORDER — CARVEDILOL 12.5 MG PO TABS: 6.2500 mg | ORAL_TABLET | Freq: Two times a day (BID) | ORAL | 3 refills | Status: DC

## 2019-04-06 MED ORDER — ACETAMINOPHEN 325 MG PO TABS: 650.0000 mg | ORAL_TABLET | ORAL | 2 refills | Status: AC | PRN

## 2019-04-06 MED ORDER — HYDROCODONE-ACETAMINOPHEN 5-325 MG PO TABS: 1.0000 | ORAL_TABLET | Freq: Three times a day (TID) | ORAL | 0 refills | Status: DC | PRN

## 2019-04-06 MED ORDER — ACETAMINOPHEN 500 MG PO TABS: 500.0000 mg | ORAL_TABLET | Freq: Four times a day (QID) | ORAL | Status: DC | PRN

## 2019-04-06 NOTE — Progress Notes (Signed)
O2 placed on pt after neb treatment

## 2019-04-06 NOTE — Progress Notes (Signed)
Received verbal order from Dr. Aline Brochure to administer one dose of naloxone (NARCAN) 0.4 mg to patient. Naloxone administered per PRN order. Will continue to monitor patient.

## 2019-04-06 NOTE — Discharge Summary (Signed)
Jennifer [name], is a 63 y.o. female  DOB 12-Jul-1955  MRN [id number].  Admission date:  04/02/2019  Admitting Physician  Jennifer Civil, MD  Discharge Date:  04/06/2019   Primary MD  [name], Jennifer Fanny, MD  Recommendations for primary care physician for things to follow:    1)Status post right knee replacement with impaired mobility--- physical therapist to come to your house to help you with your rehab --Follow the instructions, please do not get up on your own without assistance due to high risk of falls and injury  2) you are taking Eliquis/apixaban to prevent blood clots after your knee surgery--so please hold aspirin and avoid ibuprofen, Aleve or other nonsteroidal anti-inflammatory agents for now due to increased risk of bleeding  3) please follow-up with your orthopedic surgeon Dr. 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 CAD (coronary artery disease)    cath 5/23 100% dist RCA lesion treated with 2 overlapping Integrity Resolute DES  ( 2.25x46mm, 2.25x79mm), 60% mid RCA treated medically, 99% OM2 not amenable to PCI, 70% D1 lesion, EF normal  . Hypercholesteremia   . Hypertension   . Hypothyroidism   . Stroke ([place]) 10/2018  . Thyroid disease   . Type 2 diabetes mellitus ([place]) 10/08/2018    Past Surgical History:  Procedure Laterality Date  . ABDOMINAL HYSTERECTOMY    . CARDIAC CATHETERIZATION N/A 11/03/2015   Procedure: Left Heart Cath and Coronary Angiography;  Surgeon: Jennifer Man, MD;  Location: [facility];  Service: Cardiovascular;  Laterality: N/A;  . CARDIAC CATHETERIZATION N/A 11/03/2015   Procedure: Coronary Stent Intervention;  Surgeon: Jennifer Man, MD;  Location: [facility];  Service: Cardiovascular;  Laterality: N/A;  . CARPAL TUNNEL RELEASE    . CORONARY ANGIOPLASTY     1 stent  . TOTAL KNEE ARTHROPLASTY Right 04/02/2019   Procedure: TOTAL KNEE ARTHROPLASTY;  Surgeon: Jennifer Civil, MD;  Location: [facility];  Service: Orthopedics;  Laterality: Right;       HPI  from the history and physical done on the day of admission:    -  63 year old female had a stroke in April she is waited the required 6 months had a neurology consult for clearance blood pressure is been handled by Dr. Glori [name]. She has diabetes hypothyroidism hypertension is on insulin currently on aspirin twice a day 81 mg which does not need to be held  Presents with chronic knee pain severe quality dull sometimes throbbing her activities of daily living have been affected by this with more difficulty getting in and out of chairs climbing stairs. She did have to use a cane for ambulation   - Consult HPI- 04/04/2019 -Jennifer [name]  is a 63 y.o. female  hx of NSTEMI/CAD (s/p PCI to RCA 2017), DM, HTN, CVA, HLD, hypothyroidism, COPD and on-going tobacco abuse right total knee replacement on 04/02/2019 for osteoarthritis--- -hospitalist medical consultation requested by Dr Jennifer [name] due to AKI, hypoxia and altered  mentation  --Patient has been receiving tramadol and oxycodone, became confused, hypoxic and lethargic -Orthopedic surgeon Dr. Aline [name] gave patient Narcan  -O2 sats initially around 88% on room air , O2 sats is up to 91-92 patient on room air at this time  -No productive cough -No fevers, no chills, patient remains confused and lethargic No nausea, vomiting, diarrhea  -No chest pains, no palpitations, no dizziness  -additional h/o obtained from Husband Mr Jennifer [name] [phone]) who is visiting pt  Also got additional information from patient's daughter Jennifer [name] by [facility] Course:   - Brief Summary -63 y.o.femalehx of NSTEMI/CAD(s/p PCI to RCA 2017), DM, HTN, CVA, HLD,hypothyroidism,COPD and on-goingtobacco abuse right total knee replacement on 04/02/2019 for osteoarthritis---   -hospitalist medical consultation requestedby Dr Alma Friendly to AKI, hypoxia and altered mentation  A/p 1)Acute Metabolic Encephalopathy--suspect opiate induced compounded by dehydration and AKI, stopped tramadol, stopped oxycodone, okay to continue Robaxin -Mental status appears to be back to normal/baseline when patient is off narcotics and muscle relaxants -She does not appear to have sedation and confusion with narcotics and muscle relaxants  2)AKI----acute kidney injurydue to dehydration in the setting of poor oral intake compounded by lisinopril and Lasix use.creatinine on admission= 1.23, baseline creatinine = 0.8 to 1.0, creatinine is now= 1.1 (peak was 1.69), renally adjust medications, avoid nephrotoxic agents/dehydration/hypotension -Discontinue lisinopril,stop Celebrex and Lasix -Avoid NSAIDs  3)acute hypoxic respiratory failure---due to opiate-related sedation in a patient with underlying COPD and tobacco abuse -Patient required Narcan -No longer requiring oxygen, O2 sats 95 to 96% on room -Continue incentive spirometry  -Tramadol and oxycodone as been discontinued  4)COPD and tobacco abuse--- okay to use OTCnicotine patch  -Bronchodilators as needed, -Smoking cessation strongly advised  5)CAD-status post prior angioplasty and stent placement,stable, EKG is nonacute,continue Lipitor,no aspirin as patient is on apixaban for DVT prophylaxis post knee surgery -Continue Coreg 6.25 mg twice daily -Hold aspirin while on apixaban  6)H/o PAD and Prior CVA-stable, apixaban and Lipitor as above #5,also on Zetia -Hold aspirin while on apixaban  7)DM2--Restart home insulin pump regimen,continue linagliptin  8)hypothyroidism--stable continue levothyroxine137 mcg  9)S/pright total knee replacement on 04/02/2019--further management per orthopedic team -Patient is on Eliquis 2.5 mg twice daily for DVT prophylaxis  10)acute blood loss anemia---hemoglobin is down to 9.8 with hydration,  admission hemoglobin was 10.9, baseline around 12 monitor H&H closely especially while on Eliquis -No evidence of active bleeding at this time  11)HFpEF--Echo from April 2020 with EF of 55 to 60% with first-degree diastolic dysfunction,patient with chronic diastolic dysfunction without acute exacerbation at this time -Lasix discontinued due to dehydration and AKI  12) generalized weakness/deconditioning--status post right total knee replacement -Physical therapist recommends SNF rehab -Patient and family have opted for home with home health [facility] with home health services  Code Status : Full Code  Family Communication:   -Discussed with husband- [phone] -Discussed with patient's daughter Jennifer [name] at bedside prior to discharge Disposition Plan  : Home with home health versus SNF rehab  Consults  : Orthopedic surgery  DVT Prophylaxis  : Apixaban  Discharge Condition: stable  Follow UP  Follow-up Information    [facility] Follow [facility].    Specialty: Home Health Services Why: HHPT          Diet and Activity recommendation:  As advised  Discharge Instructions    Discharge Instructions    Call MD for:  difficulty breathing, headache or visual disturbances   Complete by: As directed    Call MD for:  extreme fatigue   Complete by: As directed    Call MD for:  persistant dizziness or light-headedness   Complete by: As directed    Call MD for:  persistant nausea and vomiting   Complete by: As directed    Call MD for:  severe uncontrolled pain   Complete by: As directed    Call MD for:  temperature >100.4   Complete by: As directed    Diet - low sodium heart healthy   Complete by: As directed    Diet Carb Modified   Complete by: As directed    Discharge instructions   Complete by: As directed    1)Status post right knee replacement with impaired mobility--- physical therapist to come to your house to help you with your rehab --Follow the instructions, please do not get up on your own without assistance due to high risk of falls and injury  2) you are taking Eliquis/apixaban to prevent blood clots after your knee surgery--so please hold aspirin and avoid ibuprofen, Aleve or other nonsteroidal anti-inflammatory agents for now due to increased risk of bleeding  3) please follow-up with your orthopedic surgeon Dr. Arther [name] as previously advised  4) repeat CBC and BMP blood test advised within the next 1 to 2 weeks  5) please note that there has been some changes to your medications including your blood pressure medications   Increase activity slowly   Complete by: As directed    Ambulate only with assistance and with walking device/walker        Discharge Medications     Allergies as of 04/06/2019   No Known Allergies     Medication List    STOP taking these medications   lisinopril 40 MG tablet Commonly known as: ZESTRIL     TAKE these medications   acetaminophen 325 MG tablet Commonly  known as: Tylenol Take 2 tablets (650 mg total) by mouth every 4 (four) hours as needed for mild pain, fever or headache.   amLODipine 10 MG tablet Commonly known as: NORVASC Take 1 tablet (10 mg total) by mouth daily.   apixaban 2.5 MG Tabs tablet Commonly known as: ELIQUIS Take 1 tablet (2.5 mg total) by mouth 2 (two) times daily. For clot prevention after right knee surgery-avoid aspirin while taking Eliquis/apixaban   aspirin EC 81 MG tablet Take 1 tablet (81 mg total) by mouth 2 (two) times daily. Hold aspirin while you are on apixaban/Eliquis What changed: additional instructions   atorvastatin 80 MG tablet Commonly known as: LIPITOR Take 1 tablet (80 mg total)  by mouth daily at 6 PM. What changed: when to take this   buPROPion 150 MG 12 hr tablet Commonly known as: WELLBUTRIN SR Take 1 tablet by mouth twice daily   carvedilol 12.5 MG tablet Commonly known as: COREG Take 0.5 tablets (6.25 mg total) by mouth 2 (two) times daily with a meal. What changed: how much to take   DULoxetine 60 MG capsule Commonly known as: CYMBALTA Take 1 capsule by mouth once daily   furosemide 20 MG tablet Commonly known as: LASIX Take 1 tablet (20 mg total) by mouth daily.   HYDROcodone-acetaminophen 5-325 MG tablet Commonly known as: NORCO/VICODIN Take 1 tablet by mouth every 8 (eight) hours as needed for moderate pain or severe pain. What changed:   how much to take  how to take this  when to take this  reasons to take this  additional instructions   insulin pump Soln Inject into the skin continuous. Patient may bolus up to 40 units at a time, with a maximum daily limit of 120 units   levothyroxine 137 MCG tablet Commonly known as: SYNTHROID Take 137 mcg by mouth daily before breakfast.   meclizine 25 MG tablet Commonly known as: ANTIVERT Take 1 tablet (25 mg total) by mouth 3 (three) times daily as needed for dizziness.   nitroGLYCERIN 0.4 MG SL tablet Commonly  known as: NITROSTAT Place 1 tablet (0.4 mg total) under the tongue every 5 (five) minutes x 3 doses as needed for chest pain.   ondansetron 4 MG tablet Commonly known as: ZOFRAN Take 1 tablet (4 mg total) by mouth every 6 (six) hours as needed for nausea or vomiting.   senna-docusate 8.6-50 MG tablet Commonly known as: Senokot-S Take 2 tablets by mouth at bedtime.   sitaGLIPtin 100 MG tablet Commonly known as: JANUVIA Take 100 mg by mouth daily.   ZETIA PO Take by mouth.       Major procedures and Radiology Reports - PLEASE review detailed and final reports for all details, in brief -   Dg Chest Port 1 View  Result Date: 04/04/2019 CLINICAL DATA:  Confusion EXAM: PORTABLE CHEST 1 VIEW COMPARISON:  11/03/2015 FINDINGS: The heart size and mediastinal contours are within normal limits. Both lungs are clear. The visualized skeletal structures are unremarkable. IMPRESSION: No acute abnormality noted. Electronically Signed   By: Inez Catalina M.D.   On: 04/04/2019 14:49   Dg Knee Right Port  Result Date: 04/02/2019 CLINICAL DATA:  Right knee replacement EXAM: PORTABLE RIGHT KNEE - 1-2 VIEW COMPARISON:  02/27/2019, 08/01/2018 FINDINGS: Changes of right knee replacement. No hardware or bony complicating feature. Soft tissue and joint space gas noted. IMPRESSION: Right knee replacement.  No complicating feature. Electronically Signed   By: Rolm Baptise M.D.   On: 04/02/2019 10:01    Micro Results    Recent Results (from the past 240 hour(s))  SARS CORONAVIRUS 2 (TAT 6-24 HRS) Nasopharyngeal Nasopharyngeal Swab     Status: None   Collection Time: 03/29/19  7:00 AM   Specimen: Nasopharyngeal Swab  Result Value Ref Range Status   SARS Coronavirus 2 NEGATIVE NEGATIVE Final    Comment: (NOTE) SARS-CoV-2 target nucleic acids are NOT DETECTED. The SARS-CoV-2 RNA is generally detectable in upper and lower respiratory specimens during the acute phase of infection. Negative results do not  preclude SARS-CoV-2 infection, do not rule out co-infections with other pathogens, and should not be used as the sole basis for treatment or other patient management decisions.  Negative results must be combined with clinical observations, patient history, and epidemiological information. The expected result is Negative. Fact Sheet for Patients: SugarRoll.be Fact Sheet for Healthcare Providers: https://www.woods-mathews.com/ This test is not yet approved or cleared by the [place] FDA and  has been authorized for detection and/or diagnosis of SARS-CoV-2 by FDA under an Emergency Use Authorization (EUA). This EUA will remain  in effect (meaning this test can be used) for the duration of the COVID-19 declaration under Section 56 4(b)(1) of the Act, 21 U.S.C. section 360bbb-3(b)(1), unless the authorization is terminated or revoked sooner. Performed at [facility], [place] [address], [place], [place] [address]   Surgical pcr screen     Status: None   Collection Time: 03/29/19 10:26 AM   Specimen: Nasal Mucosa; Nasal Swab  Result Value Ref Range Status   MRSA, PCR NEGATIVE NEGATIVE Final   Staphylococcus aureus NEGATIVE NEGATIVE Final    Comment: (NOTE) The Xpert SA Assay (FDA approved for NASAL specimens in patients 62 years of age and older), is one component of a comprehensive surveillance program. It is not intended to diagnose infection nor to guide or monitor treatment. Performed at [facility], [address], [place], [place] [address]        Today   Subjective    Nadera Hollinger today has no new complaints -Daughter at bedside questions answered  -Continues to have episodes of disorientation after narcotics and muscle relaxants          Patient has been seen and examined prior to discharge   Objective   Blood pressure (!) 157/60, pulse 82, temperature 98.1 F (36.7 C), temperature source Oral, resp. rate 18, height  5\' 7"  (1.702 m), weight 121.1 kg, SpO2 94 %.   Intake/Output Summary (Last 24 hours) at 04/06/2019 1423 Last data filed at 04/06/2019 0900 Gross per 24 hour  Intake 1200.39 ml  Output -  Net 1200.39 ml    Exam Gen:- Awake Alert, no acute distress  HEENT:- West .AT, No sclera icterus Neck-Supple Neck,No JVD,.  Lungs-  CTAB , good air movement bilaterally  CV- S1, S2 normal, regular Abd-  +ve B.Sounds, Abd Soft, No tenderness,    Extremity/Skin:- No  edema,   good pulses Psych-episodes of disorientation after hydrocodone Neuro-generalized weakness, no new focal deficits, no tremors  -MSK-right knee postop wound clean dry intact   Data Review   CBC w Diff:  Lab Results  Component Value Date   WBC 7.4 04/05/2019   HGB 9.8 (L) 04/05/2019   HCT 31.9 (L) 04/05/2019   PLT 185 04/05/2019   LYMPHOPCT 18 03/29/2019   MONOPCT 6 03/29/2019   EOSPCT 1 03/29/2019   BASOPCT 1 03/29/2019    CMP:  Lab Results  Component Value Date   NA 139 04/05/2019   K 3.9 04/05/2019   CL 109 04/05/2019   CO2 24 04/05/2019   BUN 31 (H) 04/05/2019   CREATININE 1.14 (H) 04/05/2019   PROT 5.6 (L) 10/08/2018   ALBUMIN 2.9 (L) 10/08/2018   BILITOT 0.4 10/08/2018   ALKPHOS 69 10/08/2018   AST 16 10/08/2018   ALT 17 10/08/2018  .   Total Discharge time is about 33 minutes  Roxan Hockey M.D on 04/06/2019 at 2:23 PM  Go to [email or web address] -  for contact info  [facility] - Office  [phone]

## 2019-04-06 NOTE — Progress Notes (Signed)
Nsg Discharge Note  Admit Date:  04/02/2019 Discharge date: 04/06/2019   Jennifer [name] to be D/C'd Home per MD order.  AVS completed.  Patient's caregiver able to verbalize understanding.  Discharge Medication: Allergies as of 04/06/2019   No Known Allergies     Medication List    STOP taking these medications   lisinopril 40 MG tablet Commonly known as: ZESTRIL     TAKE these medications   acetaminophen 325 MG tablet Commonly known as: Tylenol Take 2 tablets (650 mg total) by mouth every 4 (four) hours as needed for mild pain, fever or headache.   amLODipine 10 MG tablet Commonly known as: NORVASC Take 1 tablet (10 mg total) by mouth daily.   apixaban 2.5 MG Tabs tablet Commonly known as: ELIQUIS Take 1 tablet (2.5 mg total) by mouth 2 (two) times daily. For clot prevention after right knee surgery-avoid aspirin while taking Eliquis/apixaban   aspirin EC 81 MG tablet Take 1 tablet (81 mg total) by mouth 2 (two) times daily. Hold aspirin while you are on apixaban/Eliquis What changed: additional instructions   atorvastatin 80 MG tablet Commonly known as: LIPITOR Take 1 tablet (80 mg total) by mouth daily at 6 PM. What changed: when to take this   buPROPion 150 MG 12 hr tablet Commonly known as: WELLBUTRIN SR Take 1 tablet by mouth twice daily   carvedilol 12.5 MG tablet Commonly known as: COREG Take 0.5 tablets (6.25 mg total) by mouth 2 (two) times daily with a meal. What changed: how much to take   DULoxetine 60 MG capsule Commonly known as: CYMBALTA Take 1 capsule by mouth once daily   furosemide 20 MG tablet Commonly known as: LASIX Take 1 tablet (20 mg total) by mouth daily.   HYDROcodone-acetaminophen 5-325 MG tablet Commonly known as: NORCO/VICODIN Take 1 tablet by mouth every 8 (eight) hours as needed for moderate pain or severe pain. What changed:   how much to take  how to take this  when to take this  reasons to take this  additional  instructions   insulin pump Soln Inject into the skin continuous. Patient may bolus up to 40 units at a time, with a maximum daily limit of 120 units   levothyroxine 137 MCG tablet Commonly known as: SYNTHROID Take 137 mcg by mouth daily before breakfast.   meclizine 25 MG tablet Commonly known as: ANTIVERT Take 1 tablet (25 mg total) by mouth 3 (three) times daily as needed for dizziness.   nitroGLYCERIN 0.4 MG SL tablet Commonly known as: NITROSTAT Place 1 tablet (0.4 mg total) under the tongue every 5 (five) minutes x 3 doses as needed for chest pain.   ondansetron 4 MG tablet Commonly known as: ZOFRAN Take 1 tablet (4 mg total) by mouth every 6 (six) hours as needed for nausea or vomiting.   senna-docusate 8.6-50 MG tablet Commonly known as: Senokot-S Take 2 tablets by mouth at bedtime.   sitaGLIPtin 100 MG tablet Commonly known as: JANUVIA Take 100 mg by mouth daily.   ZETIA PO Take by mouth.       Discharge Assessment: Vitals:   04/06/19 1303 04/06/19 1345  BP: (!) 150/53 (!) 157/60  Pulse: 79 82  Resp: 18 18  Temp:  98.1 F (36.7 C)  SpO2: 92% 94%   Skin clean, dry and intact without evidence of skin break down, no evidence of skin tears noted. IV catheter discontinued intact. Site without signs and symptoms of complications -  no redness or edema noted at insertion site, patient denies c/o pain - only slight tenderness at site.  Dressing with slight pressure applied.  D/c Instructions-Education: Discharge instructions given to Jennifer [name] daughter  with verbalized understanding. D/c education completed with patient's daughter including follow up instructions, medication list, d/c activities limitations if indicated, with other d/c instructions as indicated by MD - patient's daughter  able to verbalize understanding, all questions fully answered. Patient instructed to return to ED, call 911, or call MD for any changes in condition.  Patient escorted via [place],  and D/C home via private auto.  Jennifer [name] Loletha Grayer, RN 04/06/2019 4:18 PM

## 2019-04-06 NOTE — Discharge Instructions (Signed)
1)Status post right knee replacement with impaired mobility--- physical therapist to come to your house to help you with your rehab --Follow the instructions, please do not get up on your own without assistance due to high risk of falls and injury  2) you are taking Eliquis/apixaban to prevent blood clots after your knee surgery--so please hold aspirin and avoid ibuprofen, Aleve or other nonsteroidal anti-inflammatory agents for now due to increased risk of bleeding  3) please follow-up with your orthopedic surgeon Dr. Arther Abbott as previously advised  4) repeat CBC and BMP blood test advised within the next 1 to 2 weeks  5) please note that there has been some changes to your medications including your blood pressure medications

## 2019-04-06 NOTE — Progress Notes (Addendum)
Physical Therapy Treatment Patient Details Name: Jennifer [name] MRN: [id number] DOB: [birth year] Today's Date: 04/06/2019  RIGHT KNEE ROM: 0-85 degrees AMBULATION DISTANCE: 22 feet using RW with Min Assist    History of Present Illness Jennifer [name] is a 63 year old female s/p R TKA following the diagnosis of osteoarthritis right knee.    PT Comments    Patient presents alert and agreeable for therapy.  Patient demonstrates improvement for sitting up bedside and increased tolerance/endurance for taking steps in room.  Patient had to use bed rail with slightly labored movement and increased time for sitting up at bedside, had most difficulty completing sit to stands at bedside due fair/poor carryover for proper hand placement and RLE weakness, able to ambulate to doorway and back to bedside with limited weightbearing on RLE resulting in short steps with LLE requiring verbal cues to take longer steps with fair/good return, limited for ambulation due to c/o fatigue and tolerated sitting up in chair after therapy with RLE dangling.  Patient on room air throughout treatment with SpO2 between 90-96% - RN notified.  Gave patient a gait belt to take home.  Patient will benefit from continued physical therapy in hospital and recommended venue below to increase strength, balance, endurance for safe ADLs and gait.   Follow Up Recommendations  SNF;Supervision for mobility/OOB;Supervision/Assistance - 24 hour     Equipment Recommendations  Rolling walker with 5" wheels    Recommendations for Other Services       Precautions / Restrictions Precautions Precautions: Fall Restrictions Weight Bearing Restrictions: Yes RLE Weight Bearing: Weight bearing as tolerated    Mobility  Bed Mobility Overal bed mobility: Needs Assistance Bed Mobility: Supine to Sit     Supine to sit: Supervision     General bed mobility comments: slightly labored movement with use of bed rail, increased  time  Transfers Overall transfer level: Needs assistance Equipment used: Rolling walker (2 wheeled) Transfers: Sit to/from [facility] Sit to Stand: Mod assist Stand pivot transfers: Min assist       General transfer comment: requires repeated verbal/tactile cueing for proper hand placement during sit to stands with fair carryover, required 3 attempts before standing due to weakness  Ambulation/Gait Ambulation/Gait assistance: Min assist Gait Distance (Feet): 22 Feet Assistive device: Rolling walker (2 wheeled) Gait Pattern/deviations: Decreased step length - right;Decreased step length - left;Decreased stance time - right;Decreased stride length Gait velocity: decreased   General Gait Details: increased endurance/distance for ambulation with slow labored cadence, limited weight bearing on RLE resulting in very short steps with LLE, no loss of balance, limited secondary to c/o fatigue   Stairs             Wheelchair Mobility    Modified Rankin (Stroke Patients Only)       Balance Overall balance assessment: Needs assistance Sitting-balance support: Feet supported;Bilateral upper extremity supported Sitting balance-Leahy Scale: Good Sitting balance - Comments: at bedside   Standing balance support: Bilateral upper extremity supported;During functional activity Standing balance-Leahy Scale: Fair Standing balance comment: using RW                            Cognition Arousal/Alertness: Awake/alert Behavior During Therapy: WFL for tasks assessed/performed Overall Cognitive Status: Within Functional Limits for tasks assessed  Exercises Total Joint Exercises Ankle Circles/Pumps: Supine;10 reps;Right;AROM;Strengthening Quad Sets: Supine;10 reps;Right;Strengthening;AROM Short Arc Quad: Supine;10 reps;Right;Strengthening;AROM Heel Slides: Supine;10  reps;Right;Strengthening;AROM Goniometric ROM: right knee: 0-85 degrees    General Comments        Pertinent Vitals/Pain Pain Assessment: Faces Faces Pain Scale: Hurts little more Pain Location: Right knee with end range flexion Pain Descriptors / Indicators: Sore Pain Intervention(s): Limited activity within patient's tolerance;Monitored during session    Home Living                      Prior Function            PT Goals (current goals can now be found in the care plan section) Acute Rehab PT Goals Patient Stated Goal: Return home with family PT Goal Formulation: With patient Time For Goal Achievement: 04/10/19 Potential to Achieve Goals: Good Progress towards PT goals: Progressing toward goals    Frequency    7X/week      PT Plan Current plan remains appropriate    Co-evaluation              AM-PAC PT "6 Clicks" Mobility   Outcome Measure  Help needed turning from your back to your side while in a flat bed without using bedrails?: None Help needed moving from lying on your back to sitting on the side of a flat bed without using bedrails?: None Help needed moving to and from a bed to a chair (including a wheelchair)?: A Little Help needed standing up from a chair using your arms (e.g., wheelchair or bedside chair)?: A Lot Help needed to walk in hospital room?: A Lot Help needed climbing 3-5 steps with a railing? : A Lot 6 Click Score: 17    End of Session Equipment Utilized During Treatment: Gait belt Activity Tolerance: Patient tolerated treatment well Patient left: in chair;with call bell/phone within reach;with chair alarm set Nurse Communication: Mobility status PT Visit Diagnosis: Unsteadiness on feet (R26.81);Other abnormalities of gait and mobility (R26.89);Muscle weakness (generalized) (M62.81);Pain Pain - Right/Left: Right Pain - part of body: Knee     Time: [id number] PT Time Calculation (min) (ACUTE ONLY): 30 min  Charges:   $Therapeutic Exercise: 8-22 mins $Therapeutic Activity: 8-22 mins                     10:03 AM, 04/06/19 Lonell Grandchild, MPT Physical Therapist with [facility] 336 [phone] office [phone] mobile phone

## 2019-04-08 ENCOUNTER — Telehealth: Payer: Self-pay

## 2019-04-08 NOTE — Telephone Encounter (Signed)
Called her to advise WBAT.

## 2019-04-08 NOTE — Telephone Encounter (Signed)
Magda Paganini therapist with [facility] left message saying that she was verifying they will see patient for a couple of weeks. She has questions about weight bearing as tolerated or non weight bearing.    Please call and advise [phone]

## 2019-04-17 ENCOUNTER — Ambulatory Visit (INDEPENDENT_AMBULATORY_CARE_PROVIDER_SITE_OTHER): Payer: 59 | Admitting: Orthopedic Surgery

## 2019-04-17 ENCOUNTER — Other Ambulatory Visit: Payer: Self-pay

## 2019-04-17 ENCOUNTER — Encounter: Payer: Self-pay | Admitting: Orthopedic Surgery

## 2019-04-17 DIAGNOSIS — G8918 Other acute postprocedural pain: Secondary | ICD-10-CM

## 2019-04-17 DIAGNOSIS — Z96651 Presence of right artificial knee joint: Secondary | ICD-10-CM

## 2019-04-17 MED ORDER — HYDROCODONE-ACETAMINOPHEN 5-325 MG PO TABS: 1.0000 | ORAL_TABLET | Freq: Two times a day (BID) | ORAL | 0 refills | Status: DC | PRN

## 2019-04-17 NOTE — Progress Notes (Signed)
  Chief Complaint  Patient presents with  . Routine Post Op    04/02/19 todal knee replacement right    Postop day 15 right total knee  Pain is controlled with 1-2 hydrocodone per day and Tylenol  Patient's range of motion is 10-80 degrees  Staples were removed and the suture line looks normal   She is with advanced PT right now at home she is having difficulty getting in and out of the house because of 7 steps and I agree with therapy that 2 weeks of home PT would be helpful  The CPM can stop  She is on Eliquis twice a day for DVT prophylaxis for another 14 to 15 days  She is out of work through November  She will follow-up in 3 weeks  Encounter Diagnoses  Name Primary?  . 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 Post-operative pain Yes

## 2019-04-17 NOTE — Patient Instructions (Addendum)
Work note out for month of November   Continue therapy at home for 2 weeks

## 2019-04-18 ENCOUNTER — Telehealth: Payer: Self-pay | Admitting: Orthopedic Surgery

## 2019-04-18 NOTE — Telephone Encounter (Signed)
Amy, PT from [facility] called and said she had spoken to this patient who stated Dr Aline Brochure agreed that she should continue with home PT.  Amy asked for PT to continue 3 x a week for 2 weeks and then discharge to outpatient therapy.  Please call Amy at [phone] to give approval for this  Thanks

## 2019-04-19 NOTE — Telephone Encounter (Signed)
Called to give verbal order to extend 2 more weeks.

## 2019-04-21 ENCOUNTER — Other Ambulatory Visit: Payer: Self-pay | Admitting: Cardiology

## 2019-04-23 ENCOUNTER — Telehealth: Payer: Self-pay | Admitting: *Deleted

## 2019-04-23 NOTE — Telephone Encounter (Signed)
Called pt and advise her to schedule lab appt here since Ortho didn't to labs at [place]

## 2019-04-23 NOTE — Telephone Encounter (Signed)
Dr. Glori Bickers sent me a message saying:  [name], [name], MD  [place], Rexene Agent M, CMA        It looks like she has a follow up with orthopedics on nov 4 = the note says she will need labs for cbc/ bmp in 1-2 weeks  If she does not get those labs at that appointment will need to schedule with [place]  Thanks

## 2019-04-28 IMAGING — MR MRI HEAD WITHOUT CONTRAST
8 of 10 series · 34 of 48 positions shown · non-contrast
Comparison: Head CT and CTA same day.  MRI 11/14/2017

CLINICAL DATA: Headache and vertigo, 2 days duration

EXAM:
MRI HEAD WITHOUT CONTRAST
TECHNIQUE: Multiplanar, multiecho pulse sequences of the brain and surrounding
structures were obtained without intravenous contrast.

[Series 2: T1 · sagittal · 5.0mm · 0.41mm/px · 1 of 20 slices shown (1 of 2)]
[im 1/20]
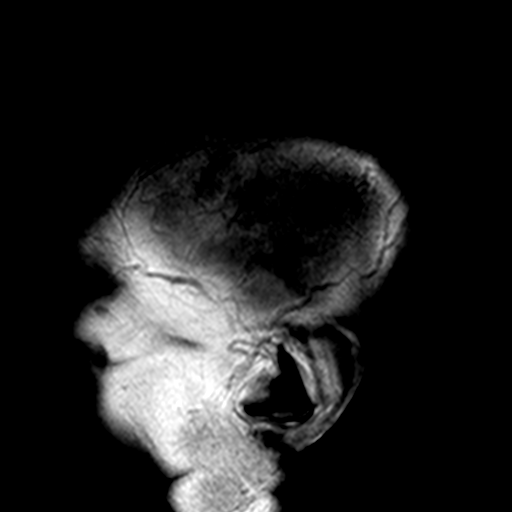

[Series 3: DWI · axial · 3.0mm · 0.73mm/px · z∈[-12,+146]mm · 7 of 54 slices shown (1 of 2)]
[im 1/54]
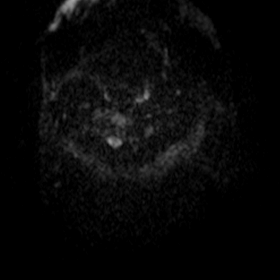
[im 9/54]
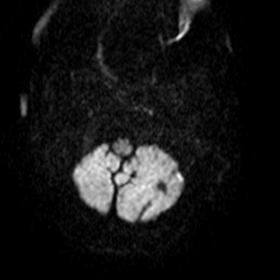
[im 18/54]
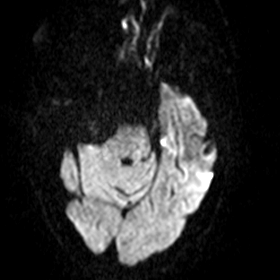
[im 27/54]
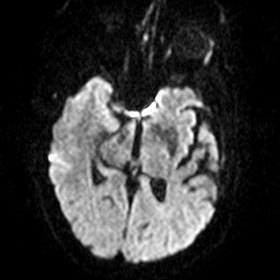
[im 36/54]
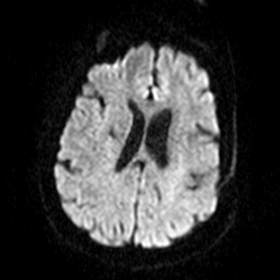
[im 45/54]
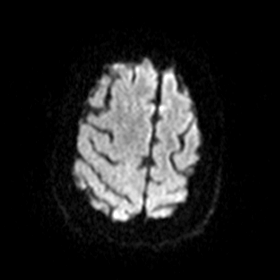
[im 54/54]
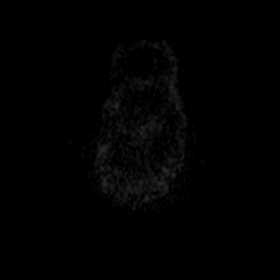

[Series 5: DWI · coronal · 5.0mm · 0.49mm/px · 4 of 34 slices shown (2 of 2)]
[im 1/34]
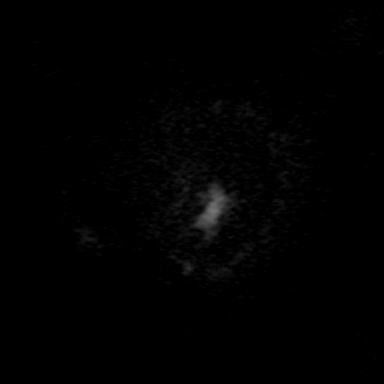
[im 12/34]
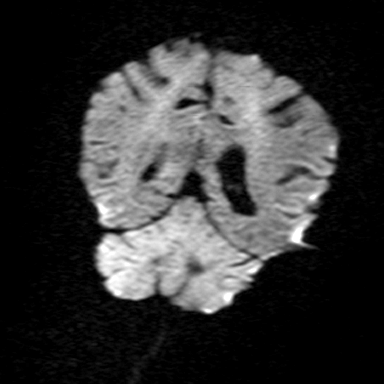
[im 23/34]
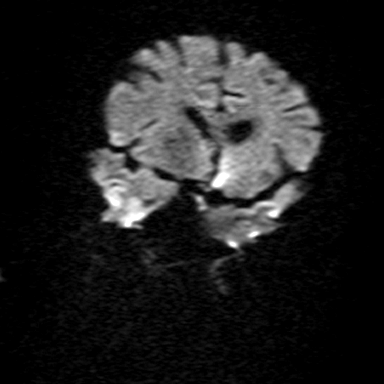
[im 34/34]
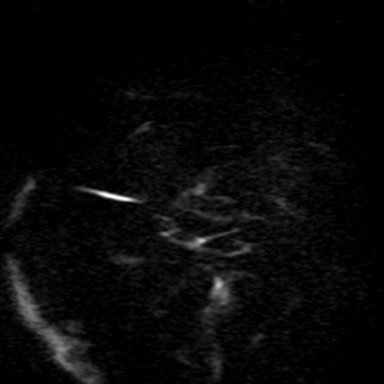

[Series 7: T2 · axial · 5.0mm · 0.68mm/px · z∈[-5,+138]mm · 3 of 23 slices shown (1 of 3)]
[im 1/23]
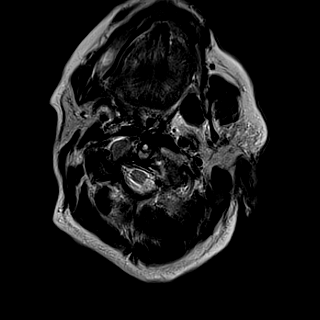
[im 12/23]
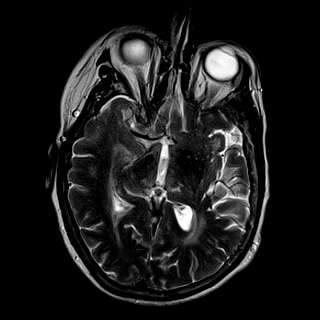
[im 23/23]
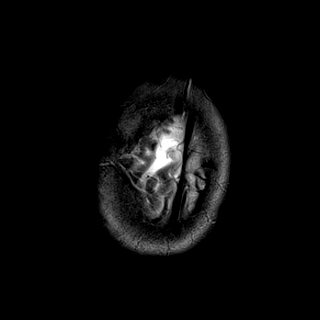

[Series 8: T2 · axial · 5.0mm · 0.41mm/px · z∈[+1,+131]mm · 3 of 21 slices shown (2 of 3)]
[im 1/21]
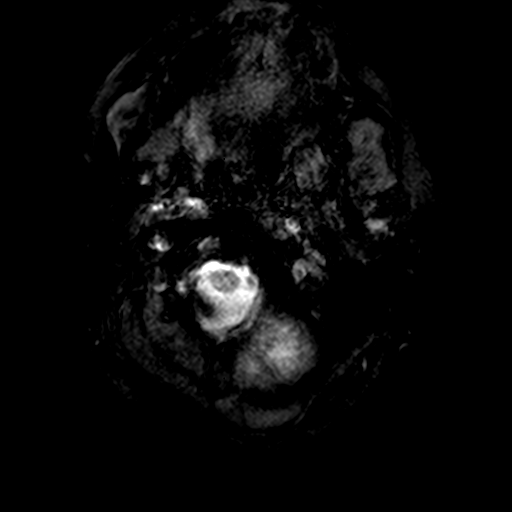
[im 11/21]
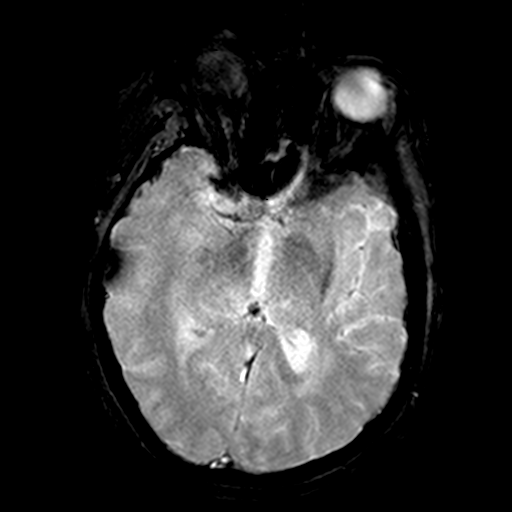
[im 21/21]
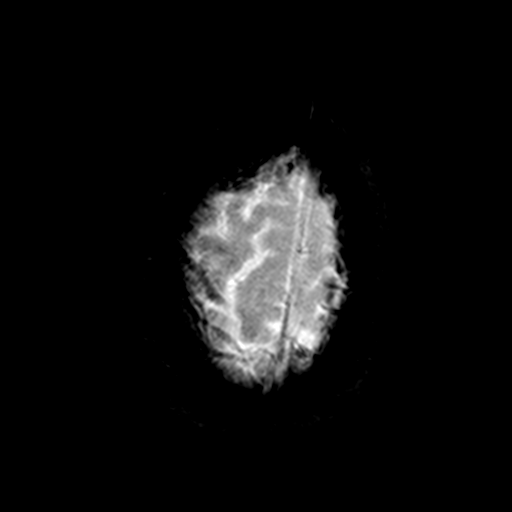

[Series 9: FLAIR · axial · 3.0mm · 0.85mm/px · z∈[-2,+135]mm · 6 of 47 slices shown]
[im 1/47]
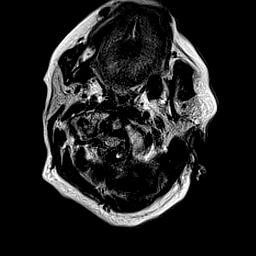
[im 10/47]
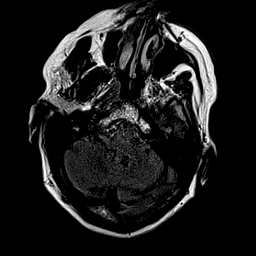
[im 19/47]
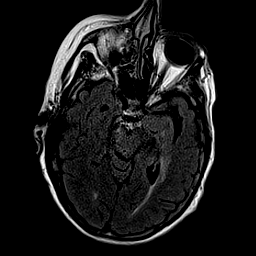
[im 28/47]
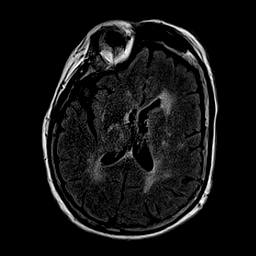
[im 37/47]
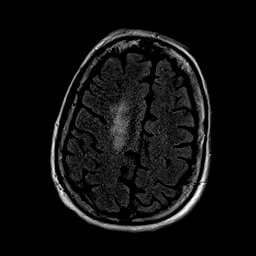
[im 47/47]
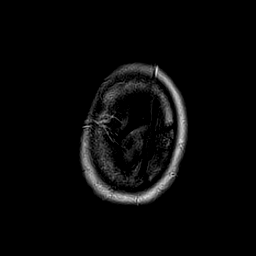

[Series 10: T1 · axial · 2.0mm · 0.44mm/px · z∈[-10,+126]mm · 7 of 78 slices shown (2 of 2)]
[im 1/78]
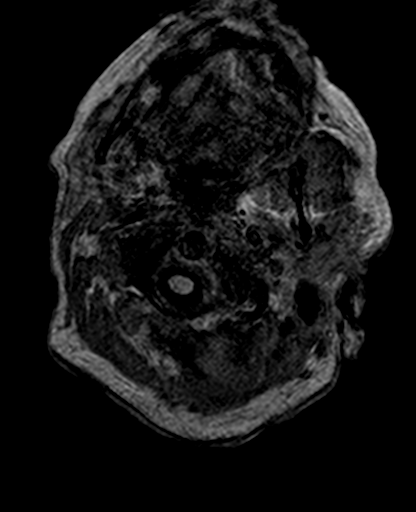
[im 9/78]
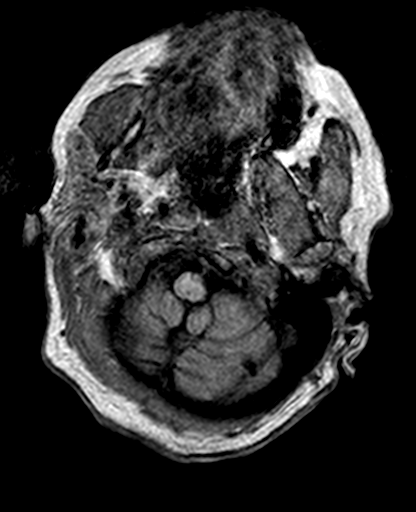
[im 26/78]
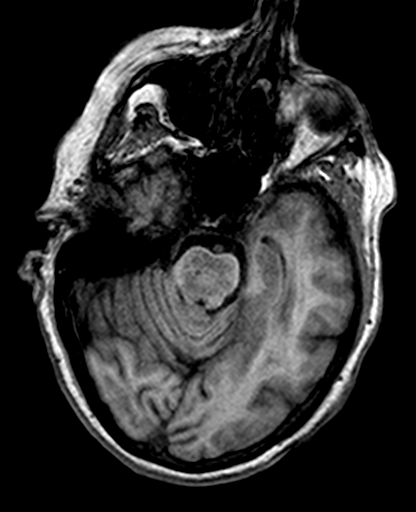
[im 35/78]
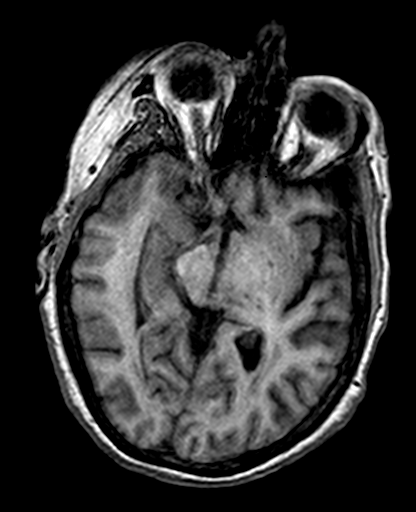
[im 43/78]
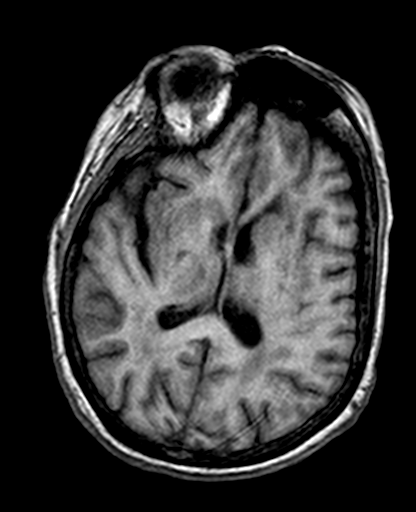
[im 52/78]
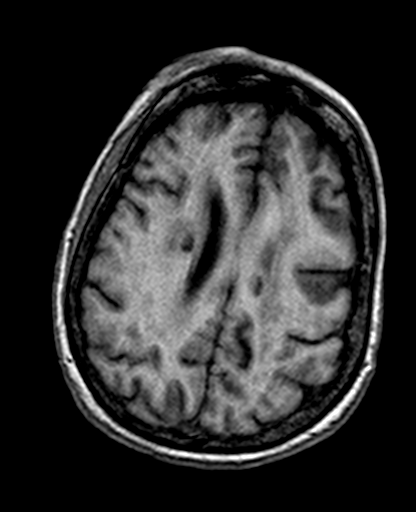
[im 69/78]
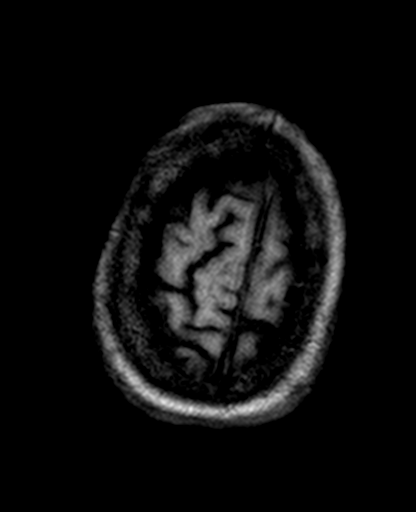

[Series 11: T2 · coronal · 5.0mm · 0.67mm/px · 3 of 28 slices shown (3 of 3)]
[im 1/28]
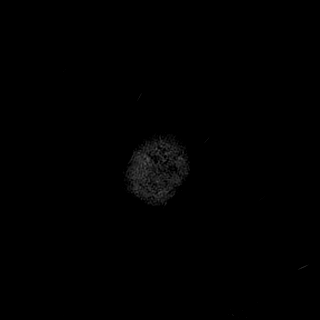
[im 14/28]
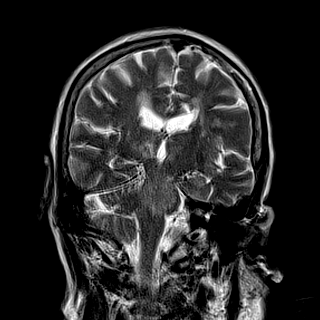
[im 28/28]
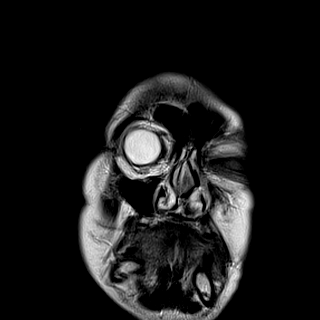

[34 of 48 positions shown; findings below may reference images not displayed]

FINDINGS: Brain: There is a background pattern of extensive chronic
small-vessel ischemic change throughout the pons. There are old
small vessel infarctions affecting the left cerebellum, both
thalami, both basal ganglia, and throughout the cerebral hemispheric
white matter. There is a 12 mm in diameter ring lesion showing
peripheral restricted diffusion located within the periventricular
deep white matter just superior to body of the left lateral
ventricle, within the lateral corpus callosum. The differential
diagnosis for ring lesions on DWI is broad. In this patient with a
history of multiple old infarctions, acute to subacute infarction
can present with this appearance. However, the differential
diagnosis does include atypical infections in person's with
immunocompromise including aspergillosis and pyogenic brain abscess.
Lymphoma can have this appearance as well. In immuno competent
patient's, demyelinating disease/multiple sclerosis/[place]
concentric sclerosis, JUMPER, KLEVER and brain tumor could also be
considered. Contrast administration would be somewhat useful in a
valuation.

Vascular: Major vessels at the base of the brain show flow.

Skull and upper cervical spine: Negative

Sinuses/Orbits: Clear/normal

Other: None
IMPRESSION: 1.2 cm in maximal diameter ring DWI restricted lesion in the lateral
corpus callosum on the left superior to the body of the left lateral
ventricle. Background pattern of extensive chronic ischemic changes
throughout the brain. The primary differential diagnosis in this
case is that of cerebral infarction versus demyelinating disease.
Many other diagnoses are also included in the differential
diagnosis, depending upon the immunologic competency status of the
patient as discussed above. I think would be useful to perform post
gadolinium imaging if possible.

## 2019-05-01 ENCOUNTER — Telehealth: Payer: Self-pay | Admitting: Orthopedic Surgery

## 2019-05-01 DIAGNOSIS — Z96651 Presence of right artificial knee joint: Secondary | ICD-10-CM

## 2019-05-01 NOTE — Telephone Encounter (Signed)
Amy from [facility] called and stated that she will discontinuing PT on Friday.  She said she didn't know if maybe Dr. Aline Brochure wanted to go ahead and send an order for outpatient PT to [facility].    I told her that I would pass the message on.  Patient is scheduled to see Dr. Aline Brochure next Wednesday, 05/08/19 at 10:50

## 2019-05-07 DIAGNOSIS — Z96651 Presence of right artificial knee joint: Secondary | ICD-10-CM | POA: Insufficient documentation

## 2019-05-08 ENCOUNTER — Other Ambulatory Visit: Payer: Self-pay

## 2019-05-08 ENCOUNTER — Encounter: Payer: Self-pay | Admitting: Orthopedic Surgery

## 2019-05-08 ENCOUNTER — Ambulatory Visit (INDEPENDENT_AMBULATORY_CARE_PROVIDER_SITE_OTHER): Payer: 59 | Admitting: Orthopedic Surgery

## 2019-05-08 DIAGNOSIS — G8918 Other acute postprocedural pain: Secondary | ICD-10-CM

## 2019-05-08 DIAGNOSIS — Z96651 Presence of right artificial knee joint: Secondary | ICD-10-CM

## 2019-05-08 MED ORDER — HYDROCODONE-ACETAMINOPHEN 5-325 MG PO TABS: 1.0000 | ORAL_TABLET | Freq: Two times a day (BID) | ORAL | 0 refills | Status: DC | PRN

## 2019-05-08 NOTE — Patient Instructions (Addendum)
Physical therapy has been ordered for you at [facility] is the phone number to call and schedule the appointment   STOP ELIQUIS   WORK NOTE [place]

## 2019-05-08 NOTE — Progress Notes (Signed)
Chief Complaint  Patient presents with  . Routine Post Op    right knee replacement 03/23/19     Postop from right total knee replacement approximately 9 weeks out doing well ready to convert to outpatient therapy she only takes 1-2 hydrocodone per day her extension is improved to 2 degrees and her flexion is 95 degrees  She can stop the Eliquis  She is still using her walker  She would need out of work note for 6 weeks  Follow-up in 5 weeks  Encounter Diagnoses  Name Primary?  . 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 Post-operative pain     Meds ordered this encounter  Medications  . HYDROcodone-acetaminophen (NORCO/VICODIN) 5-325 MG tablet    Sig: Take 1 tablet by mouth every 12 (twelve) hours as needed for moderate pain.    Dispense:  28 tablet    Refill:  0

## 2019-05-14 ENCOUNTER — Encounter (HOSPITAL_COMMUNITY): Payer: Self-pay | Admitting: Orthopedic Surgery

## 2019-05-16 ENCOUNTER — Encounter (HOSPITAL_COMMUNITY): Payer: Self-pay | Admitting: Physical Therapy

## 2019-05-16 ENCOUNTER — Ambulatory Visit (HOSPITAL_COMMUNITY): Payer: 59 | Attending: Orthopedic Surgery | Admitting: Physical Therapy

## 2019-05-16 ENCOUNTER — Other Ambulatory Visit: Payer: Self-pay

## 2019-05-16 DIAGNOSIS — M25661 Stiffness of right knee, not elsewhere classified: Secondary | ICD-10-CM

## 2019-05-16 DIAGNOSIS — M25561 Pain in right knee: Secondary | ICD-10-CM | POA: Diagnosis present

## 2019-05-16 DIAGNOSIS — R2689 Other abnormalities of gait and mobility: Secondary | ICD-10-CM

## 2019-05-16 DIAGNOSIS — M6281 Muscle weakness (generalized): Secondary | ICD-10-CM | POA: Diagnosis present

## 2019-05-16 NOTE — Therapy (Signed)
[place], [place], [address] Phone: [phone]   Fax:  [phone]  Physical Therapy Evaluation  Patient Details  Name: Jennifer [name] MRN: [id number] Date of Birth: 1956/02/24 Referring Provider (PT): Arther Abbott MD  RIGHTKNEE ROM: 2- 100 degrees AMBULATION DISTANCE: 220 feet with rolling walker in 2 minute walk test, slow with decreased flexion/extension ROM throughout    Encounter Date: 05/16/2019  PT End of Session - 05/16/19 1448    Visit Number  1    Number of Visits  18    Date for PT Re-Evaluation  06/27/19   mini-reassess 06/06/19   Authorization Type  [employer]    Authorization Time Period  05/16/19-06/27/2019 (no auth req)    Authorization - Visit Number  1    Authorization - Number of Visits  10    PT Start Time  [id number]    PT Stop Time  1430    PT Time Calculation (min)  45 min    Activity Tolerance  Patient tolerated treatment well    Behavior During Therapy  WFL for tasks assessed/performed       Past Medical History:  Diagnosis Date  . CAD (coronary artery disease)    cath 5/23 100% dist RCA lesion treated with 2 overlapping Integrity Resolute DES ( 2.25x75mm, 2.25x52mm), 60% mid RCA treated medically, 99% OM2 not amenable to PCI, 70% D1 lesion, EF normal  . Hypercholesteremia   . Hypertension   . Hypothyroidism   . Stroke ([place]) 10/2018  . Thyroid disease   . Type 2 diabetes mellitus ([place]) 10/08/2018    Past Surgical History:  Procedure Laterality Date  . ABDOMINAL HYSTERECTOMY    . CARDIAC CATHETERIZATION N/A 11/03/2015   Procedure: Left Heart Cath and Coronary Angiography;  Surgeon: Leonie Man, MD;  Location: [facility];  Service: Cardiovascular;  Laterality: N/A;  . CARDIAC CATHETERIZATION N/A 11/03/2015   Procedure: Coronary Stent Intervention;  Surgeon: Leonie Man, MD;  Location: [facility];  Service: Cardiovascular;  Laterality: N/A;  . CARPAL  TUNNEL RELEASE    . CORONARY ANGIOPLASTY     1 stent  . TOTAL KNEE ARTHROPLASTY Right 04/02/2019   Procedure: TOTAL KNEE ARTHROPLASTY;  Surgeon: Carole Civil, MD;  Location: [facility];  Service: Orthopedics;  Laterality: Right;    There were no vitals filed for this visit.   Subjective Assessment - 05/16/19 1359    Subjective  Patient is a 63 y.o. female presenting to physical therapy s/p R TKA on 03/23/19. Patient has been having issues with her knee for many years and decided to have it replaced. She had home health PT for 3 weeks. She recalls doing knee bends and stepping sideways. She has not been performing any other exercises since. Walking, balance, transfers, and lifting her leg have been difficult. She continues to have pain with certain movements. She continues to have pain on the outside of her knee as well as above her knee cap. She has swelling bilateral lower extremity swelling which begins right after she wakes up. Her main goal is to walk without using a walker or cane. She walks around her house doing laundry and chores.    Pertinent History  hypertension, Hx CVA, Diabetes, Depression    Limitations  Sitting;Lifting;Standing;Walking;House hold activities    How long can you sit comfortably?  10 minutes    How long can you stand comfortably?  30 minutes  How long can you walk comfortably?  30 minutes with walker    Patient Stated Goals  walk without walker or cane    Currently in Pain?  No/denies   4/10 at worse in last several days        [facility] PT Assessment - 05/16/19 0001      Assessment   Medical Diagnosis  R TKA    Referring Provider (PT)  Arther Abbott MD    Onset Date/Surgical Date  03/23/19    Next MD Visit  06/12/19    Prior Therapy  yes, Home health      Precautions   Precautions  None      Restrictions   Weight Bearing Restrictions  No      Balance Screen   Has the patient fallen in the past 6 months  Yes    How many times?  1   getting  into bed   Has the patient had a decrease in activity level because of a fear of falling?   Yes    Is the patient reluctant to leave their home because of a fear of falling?   Yes      Prior Function   Level of Independence  Independent    Vocation  Retired    [employer]  former tobacco company employee      Cognition   Overall Cognitive Status  Within Functional Limits for tasks assessed      Observation/Other Assessments   Observations  well healing incision on R knee, bilateral pitting edema in lower extremities     Focus on Therapeutic Outcomes (FOTO)   41%      ROM / Strength   AROM / PROM / Strength  AROM;Strength;PROM      AROM   Overall AROM   Deficits    Overall AROM Comments  minimal pain at end ranges    AROM Assessment Site  Knee    Right/Left Knee  Right;Left    Right Knee Extension  2   lacking 2   Right Knee Flexion  100    Left Knee Extension  0    Left Knee Flexion  110   cramping in hamsting     PROM   Overall PROM   Deficits    PROM Assessment Site  Knee    Right/Left Knee  Right;Left    Right Knee Extension  0    Right Knee Flexion  110      Strength   Strength Assessment Site  Hip;Knee;Ankle    Right/Left Hip  Right;Left    Right Hip Flexion  3+/5    Right Hip ABduction  4/5    Left Hip Flexion  4-/5    Left Hip ABduction  4/5    Right/Left Knee  Right;Left    Right Knee Flexion  4+/5    Right Knee Extension  4+/5    Left Knee Flexion  5/5    Left Knee Extension  5/5    Right/Left Ankle  Right;Left    Right Ankle Dorsiflexion  5/5    Left Ankle Dorsiflexion  5/5      Palpation   Palpation comment  unable to assess secondary to time contstraints      Transfers   Transfers  Sit to Stand;Stand to Sit    Sit to Stand  6: Modified independent (Device/Increase time)   with UE use, uses LLE more   Stand to Sit  6: Modified independent (Device/Increase time)  with UE use     Ambulation/Gait   Ambulation/Gait  Yes     Ambulation/Gait Assistance  6: Modified independent (Device/Increase time)    Ambulation Distance (Feet)  220 Feet    Assistive device  Rolling walker    Gait Pattern  Step-through pattern;Decreased step length - right;Decreased step length - left;Decreased stride length;Decreased hip/knee flexion - right;Antalgic;Poor foot clearance - right;Poor foot clearance - left   decreased R knee flexion/extension ROM througout cycle   Gait velocity  .56 m/s    Gait Comments  2 MWT, minimal unsteadiness with RW                Objective measurements completed on examination: See above findings.      [facility] Adult PT Treatment/Exercise - 05/16/19 0001      Exercises   Exercises  Knee/Hip      Knee/Hip Exercises: Stretches   Other Knee/Hip Stretches  seated Knee flexion stretch 5 x 20 second holds             PT Education - 05/16/19 1438    Education Details  Patient educated on exam findings, POC, and initial HEP, as well as talking to physican about what appears to be pitting edema in bilateral LE    Person(s) Educated  Patient    Methods  Explanation    Comprehension  Verbalized understanding       PT Short Term Goals - 05/16/19 1459      PT SHORT TERM GOAL #1   Title  Patient will be independent with HEP in order to optimze functional outcomes.    Time  3    Period  Weeks    Status  New    Target Date  06/06/19        PT Long Term Goals - 05/16/19 1500      PT LONG TERM GOAL #1   Title  Patient will ambulate with gait speed of at least 0.8 m/s with least restrictive device in order to ambulate in the community.    Baseline  .56 m/s    Time  6    Period  Weeks    Status  New    Target Date  06/27/19      PT LONG TERM GOAL #2   Title  Patient will improve FOTO score by at least 10% in order to demonstrate improved tolerance to activity.    Baseline  41%    Time  6    Period  Weeks    Status  New    Target Date  06/27/19      PT LONG TERM GOAL #3    Title  Patient will report at least 75% improvement of symptoms for improved quality of life.    Time  6    Period  Weeks    Status  New    Target Date  06/27/19      PT LONG TERM GOAL #4   Title  Patient will be able to walk for at least 30 minutes without AD in order to grocery shop.    Baseline  30 min with walker    Time  6    Period  Weeks    Status  New    Target Date  06/27/19             Plan - 05/16/19 1451    Clinical Impression Statement  Patient is a 63 y.o. female presenting to physical therapy s/p R  TKA on 03/23/19. She presents with pain limited deficits in R knee and LE strength, ROM, endurance, balance, gait, and functional mobility with ADL. She is having to modify and restrict ADL as indicated by FOTO score and subjective information and objective measures which is affecting overall participation. Patient will benefit from skilled physical therapy in order to improve function and reduce impairment.    Personal Factors and Comorbidities  Age;Comorbidity 3+;Past/Current Experience;Fitness    Comorbidities  hypertension, Hx CVA, Diabetes, Depression, obesity    Examination-Activity Limitations  Bed Mobility;Bend;Carry;Locomotion Level;Sit;Sleep;Squat;Stairs;Stand;Lift;Transfers    Examination-Participation Restrictions  Church;Cleaning;Driving;Laundry;Meal Prep;Volunteer;Yard Work    Stability/Clinical Decision Making  Stable/Uncomplicated    Designer, jewellery  Low    Rehab Potential  Good    PT Frequency  3x / week    PT Duration  6 weeks    PT Treatment/Interventions  ADLs/Self Care Home Management;Aquatic Therapy;Cryotherapy;Electrical Stimulation;Iontophoresis 4mg /ml Dexamethasone;Moist Heat;Ultrasound;Contrast Bath;DME Instruction;Gait training;Stair training;Functional mobility training;Therapeutic activities;Therapeutic exercise;Balance training;Neuromuscular re-education;Patient/family education;Manual techniques;Orthotic Fit/Training;Manual lymph  drainage;Compression bandaging;Scar mobilization;Passive range of motion;Dry needling;Energy conservation;Splinting;Taping;Vasopneumatic Device;Joint Manipulations    PT Next Visit Plan  Add exercises to improve R knee mobility, begin R knee/ LE strengthening as tolerated, balance and gait training    PT Home Exercise Plan  05/16/19: R knee flexion stretch in seated    Consulted and Agree with Plan of Care  Patient       Patient will benefit from skilled therapeutic intervention in order to improve the following deficits and impairments:  Abnormal gait, Decreased activity tolerance, Decreased balance, Decreased endurance, Decreased coordination, Decreased mobility, Decreased range of motion, Decreased strength, Difficulty walking, Increased edema, Increased fascial restricitons, Increased muscle spasms, Impaired flexibility, Obesity, Pain  Visit Diagnosis: Other abnormalities of gait and mobility  Right knee pain, unspecified chronicity  Stiffness of right knee, not elsewhere classified  Muscle weakness (generalized)     Problem List Patient Active Problem List   Diagnosis Date Noted  . S/P TKR (total knee replacement), right 03/23/2019 05/07/2019  . Primary osteoarthritis of right knee 04/02/2019  . Unilateral primary osteoarthritis, right knee   . Pedal edema 02/25/2019  . Acute vertigo with vomiting and inability to stand 10/08/2018  . Type 2 diabetes mellitus ([place]) 10/08/2018  . Carotid artery disease ([place]) 03/15/2018  . Cerebrovascular accident (CVA) ([place]) 11/16/2017  . Morbid obesity ([place]) 11/09/2017  . Low back pain 10/26/2017  . Screening mammogram, encounter for 09/18/2017  . CAD S/P percutaneous coronary angioplasty 11/04/2015  . NSTEMI (non-ST elevated myocardial infarction) ([place]) 11/03/2015  . Hypothyroidism 07/06/2010  . Hyperlipidemia LDL goal <100 08/28/2008  . Essential hypertension 11/13/2007  . 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 Diabetic peripheral neuropathy ([place]) 09/21/2006  . TOBACCO ABUSE 09/21/2006  . Adjustment disorder with mixed anxiety and depressed mood 09/21/2006  . CARPAL TUNNEL SYNDROME 09/21/2006  . COPD (chronic obstructive pulmonary disease) ([place]) 09/21/2006  . EDEMA 09/21/2006  . MIGRAINES, HX OF 09/21/2006    3:09 PM, 05/16/19 Mearl Latin PT, DPT Physical Therapist at [place], [place], [address] Phone: [phone]   Fax:  [phone]  Name: Jennifer [name] MRN: [id number] Date of Birth: 1955-09-15

## 2019-05-20 ENCOUNTER — Other Ambulatory Visit: Payer: Self-pay

## 2019-05-20 ENCOUNTER — Ambulatory Visit (HOSPITAL_COMMUNITY): Payer: 59 | Admitting: Physical Therapy

## 2019-05-20 DIAGNOSIS — M25661 Stiffness of right knee, not elsewhere classified: Secondary | ICD-10-CM

## 2019-05-20 DIAGNOSIS — R2689 Other abnormalities of gait and mobility: Secondary | ICD-10-CM | POA: Diagnosis not present

## 2019-05-20 DIAGNOSIS — M25561 Pain in right knee: Secondary | ICD-10-CM

## 2019-05-20 DIAGNOSIS — M6281 Muscle weakness (generalized): Secondary | ICD-10-CM

## 2019-05-20 NOTE — Therapy (Signed)
[place], [place], [address] Phone: [phone]   Fax:  [phone]  Physical Therapy Treatment  Patient Details  Name: Jennifer [name] MRN: [id number] Date of Birth: 08-31-1955 Referring Provider (PT): Arther Abbott MD   Encounter Date: 05/20/2019  PT End of Session - 05/20/19 1740    Visit Number  2    Number of Visits  18    Date for PT Re-Evaluation  06/27/19   mini-reassess 06/06/19   Authorization Type  [employer]    Authorization Time Period  05/16/19-06/27/2019 (no auth req)    Authorization - Visit Number  2    Authorization - Number of Visits  10    PT Start Time  1450    PT Stop Time  1530    PT Time Calculation (min)  40 min    Activity Tolerance  Patient tolerated treatment well    Behavior During Therapy  [facility] for tasks assessed/performed       Past Medical History:  Diagnosis Date  . CAD (coronary artery disease)    cath 5/23 100% dist RCA lesion treated with 2 overlapping Integrity Resolute DES ( 2.25x58mm, 2.25x40mm), 60% mid RCA treated medically, 99% OM2 not amenable to PCI, 70% D1 lesion, EF normal  . Hypercholesteremia   . Hypertension   . Hypothyroidism   . Stroke ([place]) 10/2018  . Thyroid disease   . Type 2 diabetes mellitus ([place]) 10/08/2018    Past Surgical History:  Procedure Laterality Date  . ABDOMINAL HYSTERECTOMY    . CARDIAC CATHETERIZATION N/A 11/03/2015   Procedure: Left Heart Cath and Coronary Angiography;  Surgeon: Leonie Man, MD;  Location: [facility];  Service: Cardiovascular;  Laterality: N/A;  . CARDIAC CATHETERIZATION N/A 11/03/2015   Procedure: Coronary Stent Intervention;  Surgeon: Leonie Man, MD;  Location: [facility];  Service: Cardiovascular;  Laterality: N/A;  . CARPAL TUNNEL RELEASE    . CORONARY ANGIOPLASTY     1 stent  . TOTAL KNEE ARTHROPLASTY Right 04/02/2019   Procedure: TOTAL KNEE ARTHROPLASTY;  Surgeon: Carole Civil, MD;  Location: [facility];  Service: Orthopedics;  Laterality: Right;    There were no vitals filed for this visit.  Subjective Assessment - 05/20/19 1735    Subjective  pt repotrs compliance withg HEP.  States she hsas some discomfort in her knee at 5/10 today.  STates she doesnt return to MD until after the first of the year.                       [facility] Adult PT Treatment/Exercise - 05/20/19 0001      Ambulation/Gait   Gait Comments  worked on gait with SPC, equal stride and heel to toe      Knee/Hip Exercises: Stretches   Active Hamstring Stretch  Right;30 seconds;3 reps    Active Hamstring Stretch Limitations  12" box    Knee: Self-Stretch to increase Flexion  Right;10 seconds    Knee: Self-Stretch Limitations  10 reps onto 12" box    Gastroc Stretch  3 reps;30 seconds    Other Knee/Hip Stretches  seated Knee flexion stretch 5 x 20 second holds   reviewed for HEP     Knee/Hip Exercises: Standing   Heel Raises  Both;10 reps    Heel Raises Limitations  toeraises 10 reps    Knee Flexion  Right;10 reps    Gait Training  SPC X 100 feet      Knee/Hip Exercises: Seated   Long Arc Quad  Right;10 reps      Knee/Hip Exercises: Supine   Knee Extension  AROM    Knee Extension Limitations  2    Knee Flexion  AROM    Knee Flexion Limitations  100             PT Education - 05/20/19 1740    Education Details  reveiwed goals, HEP and POC moving forward    Person(s) Educated  Patient    Methods  Explanation    Comprehension  Verbalized understanding       PT Short Term Goals - 05/20/19 1528      PT SHORT TERM GOAL #1   Title  Patient will be independent with HEP in order to optimze functional outcomes.    Time  3    Period  Weeks    Status  On-going    Target Date  06/06/19        PT Long Term Goals - 05/20/19 1528      PT LONG TERM GOAL #1   Title  Patient will ambulate with gait speed of at least 0.8 m/s with least restrictive device in  order to ambulate in the community.    Baseline  .56 m/s    Time  6    Period  Weeks    Status  On-going      PT LONG TERM GOAL #2   Title  Patient will improve FOTO score by at least 10% in order to demonstrate improved tolerance to activity.    Baseline  41%    Time  6    Period  Weeks    Status  On-going      PT LONG TERM GOAL #3   Title  Patient will report at least 75% improvement of symptoms for improved quality of life.    Time  6    Period  Weeks    Status  On-going      PT LONG TERM GOAL #4   Title  Patient will be able to walk for at least 30 minutes without AD in order to grocery shop.    Baseline  30 min with walker    Time  6    Period  Weeks    Status  On-going            Plan - 05/20/19 1741    Clinical Impression Statement  Reveiwed goals, HEP and POC moving forward.  Pt able to recall HEP and added LAQ to HEP.  Began bike for rockin today, standing exercises and stretches to improve knee flexion and extension.  pt able to complete all without complaints or issues.  No increase or decrease in pain reported at end of session.    Personal Factors and Comorbidities  Age;Comorbidity 3+;Past/Current Experience;Fitness    Comorbidities  hypertension, Hx CVA, Diabetes, Depression, obesity    Examination-Activity Limitations  Bed Mobility;Bend;Carry;Locomotion Level;Sit;Sleep;Squat;Stairs;Stand;Lift;Transfers    Examination-Participation Restrictions  Church;Cleaning;Driving;Laundry;Meal Prep;Volunteer;Yard Work    Stability/Clinical Decision Making  Stable/Uncomplicated    Rehab Potential  Good    PT Frequency  3x / week    PT Duration  6 weeks    PT Treatment/Interventions  ADLs/Self Care Home Management;Aquatic Therapy;Cryotherapy;Electrical Stimulation;Iontophoresis 4mg /ml Dexamethasone;Moist Heat;Ultrasound;Contrast Bath;DME Instruction;Gait training;Stair training;Functional mobility training;Therapeutic activities;Therapeutic exercise;Balance  training;Neuromuscular re-education;Patient/family education;Manual techniques;Orthotic Fit/Training;Manual lymph drainage;Compression bandaging;Scar mobilization;Passive range of motion;Dry needling;Energy conservation;Splinting;Taping;Vasopneumatic Device;Joint Manipulations  PT Next Visit Plan  Continue with focus on R knee mobility.   Next session add SLS, manual as needed.    PT Home Exercise Plan  05/16/19: R knee flexion stretch in seated    Consulted and Agree with Plan of Care  Patient       Patient will benefit from skilled therapeutic intervention in order to improve the following deficits and impairments:  Abnormal gait, Decreased activity tolerance, Decreased balance, Decreased endurance, Decreased coordination, Decreased mobility, Decreased range of motion, Decreased strength, Difficulty walking, Increased edema, Increased fascial restricitons, Increased muscle spasms, Impaired flexibility, Obesity, Pain  Visit Diagnosis: Right knee pain, unspecified chronicity  Stiffness of right knee, not elsewhere classified  Other abnormalities of gait and mobility  Muscle weakness (generalized)     Problem List Patient Active Problem List   Diagnosis Date Noted  . S/P TKR (total knee replacement), right 03/23/2019 05/07/2019  . Primary osteoarthritis of right knee 04/02/2019  . Unilateral primary osteoarthritis, right knee   . Pedal edema 02/25/2019  . Acute vertigo with vomiting and inability to stand 10/08/2018  . Type 2 diabetes mellitus ([place]) 10/08/2018  . Carotid artery disease ([place]) 03/15/2018  . Cerebrovascular accident (CVA) ([place]) 11/16/2017  . Morbid obesity ([place]) 11/09/2017  . Low back pain 10/26/2017  . Screening mammogram, encounter for 09/18/2017  . CAD S/P percutaneous coronary angioplasty 11/04/2015  . NSTEMI (non-ST elevated myocardial infarction) ([place]) 11/03/2015  . Hypothyroidism 07/06/2010  . Hyperlipidemia LDL goal <100 08/28/2008  . Essential  hypertension 11/13/2007  . 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 Diabetic peripheral neuropathy ([place]) 09/21/2006  . TOBACCO ABUSE 09/21/2006  . Adjustment disorder with mixed anxiety and depressed mood 09/21/2006  . CARPAL TUNNEL SYNDROME 09/21/2006  . COPD (chronic obstructive pulmonary disease) ([place]) 09/21/2006  . EDEMA 09/21/2006  . MIGRAINES, HX OF 09/21/2006   Teena Irani, PTA/CLT [phone]  Teena Irani 05/20/2019, 5:43 PM  [facility] [address] [place], [place], [address] Phone: [phone]   Fax:  [phone]  Name: SHASHONA KUENSTLER MRN: [id number] Date of Birth: 1955-09-24

## 2019-05-22 ENCOUNTER — Ambulatory Visit (HOSPITAL_COMMUNITY): Payer: 59

## 2019-05-22 ENCOUNTER — Other Ambulatory Visit: Payer: Self-pay

## 2019-05-22 ENCOUNTER — Encounter (HOSPITAL_COMMUNITY): Payer: Self-pay

## 2019-05-22 DIAGNOSIS — R2689 Other abnormalities of gait and mobility: Secondary | ICD-10-CM | POA: Diagnosis not present

## 2019-05-22 DIAGNOSIS — M25561 Pain in right knee: Secondary | ICD-10-CM

## 2019-05-22 DIAGNOSIS — M25661 Stiffness of right knee, not elsewhere classified: Secondary | ICD-10-CM

## 2019-05-22 DIAGNOSIS — M6281 Muscle weakness (generalized): Secondary | ICD-10-CM

## 2019-05-22 NOTE — Therapy (Signed)
[place], [place], [address] Phone: [phone]   Fax:  [phone]  Physical Therapy Treatment  Patient Details  Name: Jennifer [name] MRN: [id number] Date of Birth: [birth year] Referring Provider (PT): Arther Abbott MD   Encounter Date: 05/22/2019  PT End of Session - 05/22/19 1542    Visit Number  3    Number of Visits  18    Date for PT Re-Evaluation  06/27/19   Minireassess 06/06/19   Authorization Type  [employer]    Authorization Time Period  05/16/19-06/27/2019 (no auth req)    Authorization - Visit Number  3    Authorization - Number of Visits  10    PT Start Time  [id number]   4' on bike, not included wiht charges   PT Stop Time  1618    PT Time Calculation (min)  43 min    Activity Tolerance  Patient tolerated treatment well    Behavior During Therapy  [facility] for tasks assessed/performed       Past Medical History:  Diagnosis Date  . CAD (coronary artery disease)    cath 5/23 100% dist RCA lesion treated with 2 overlapping Integrity Resolute DES ( 2.25x40mm, 2.25x62mm), 60% mid RCA treated medically, 99% OM2 not amenable to PCI, 70% D1 lesion, EF normal  . Hypercholesteremia   . Hypertension   . Hypothyroidism   . Stroke ([place]) 10/2018  . Thyroid disease   . Type 2 diabetes mellitus ([place]) 10/08/2018    Past Surgical History:  Procedure Laterality Date  . ABDOMINAL HYSTERECTOMY    . CARDIAC CATHETERIZATION N/A 11/03/2015   Procedure: Left Heart Cath and Coronary Angiography;  Surgeon: Leonie Man, MD;  Location: [place] [facility];  Service: Cardiovascular;  Laterality: N/A;  . CARDIAC CATHETERIZATION N/A 11/03/2015   Procedure: Coronary Stent Intervention;  Surgeon: Leonie Man, MD;  Location: [place] [facility];  Service: Cardiovascular;  Laterality: N/A;  . CARPAL TUNNEL RELEASE    . CORONARY ANGIOPLASTY     1 stent  . TOTAL KNEE ARTHROPLASTY Right 04/02/2019   Procedure: TOTAL KNEE  ARTHROPLASTY;  Surgeon: Carole Civil, MD;  Location: [facility];  Service: Orthopedics;  Laterality: Right;    There were no vitals filed for this visit.  Subjective Assessment - 05/22/19 1541    Subjective  Pt arrived with SPC.  Reports knee feels pretty good, no reports of pain today.  Reoprts she took shower for first time without family assistance, as well as washing dishes and laundry.    Pertinent History  hypertension, Hx CVA, Diabetes, Depression    Patient Stated Goals  walk without walker or cane    Currently in Pain?  No/denies         [facility] PT Assessment - 05/22/19 0001      Assessment   Medical Diagnosis  R TKA    Referring Provider (PT)  Arther Abbott MD    Onset Date/Surgical Date  03/23/19    Next MD Visit  06/12/19    Prior Therapy  yes, Home health      Precautions   Precautions  None                   [facility] Adult PT Treatment/Exercise - 05/22/19 0001      Ambulation/Gait   Ambulation/Gait  Yes    Ambulation/Gait Assistance  6: Modified independent (Device/Increase time)    Ambulation Distance (Feet)  200 Feet    Assistive device  Straight cane    Gait Pattern  Step-through pattern;Decreased step length - right;Decreased step length - left;Decreased stride length;Decreased hip/knee flexion - right;Antalgic;Poor foot clearance - right;Poor foot clearance - left    Ambulation Surface  Level;Indoor    Gait Comments  worked on Production manager with SPC, equal stride and heel to toe      Knee/Hip Exercises: Stretches   Knee: Self-Stretch to increase Flexion  Right;10 seconds    Knee: Self-Stretch Limitations  10 reps onto 12" box    Gastroc Stretch  3 reps;30 seconds    Gastroc Stretch Limitations  slant board      Knee/Hip Exercises: Aerobic   Stationary Bike  seat 13 x 4 min for mobility, able to make full revolution      Knee/Hip Exercises: Standing   Heel Raises  Both;15 reps    Heel Raises Limitations  slope with toe raises    SLS  3"  max BLE    Gait Training  SPC through session      Knee/Hip Exercises: Seated   Long Arc Quad  Right;20 reps    Sit to [employer]  10 reps;without UE support   elevated height, eccentric control     Knee/Hip Exercises: Supine   Bridges  10 reps    Straight Leg Raises  10 reps    Straight Leg Raises Limitations  SLR with no extension lag    Knee Extension  AROM    Knee Extension Limitations  2    Knee Flexion  AROM    Knee Flexion Limitations  112   was 100 last session              PT Short Term Goals - 05/20/19 1528      PT SHORT TERM GOAL #1   Title  Patient will be independent with HEP in order to optimze functional outcomes.    Time  3    Period  Weeks    Status  On-going    Target Date  06/06/19        PT Long Term Goals - 05/20/19 1528      PT LONG TERM GOAL #1   Title  Patient will ambulate with gait speed of at least 0.8 m/s with least restrictive device in order to ambulate in the community.    Baseline  .56 m/s    Time  6    Period  Weeks    Status  On-going      PT LONG TERM GOAL #2   Title  Patient will improve FOTO score by at least 10% in order to demonstrate improved tolerance to activity.    Baseline  41%    Time  6    Period  Weeks    Status  On-going      PT LONG TERM GOAL #3   Title  Patient will report at least 75% improvement of symptoms for improved quality of life.    Time  6    Period  Weeks    Status  On-going      PT LONG TERM GOAL #4   Title  Patient will be able to walk for at least 30 minutes without AD in order to grocery shop.    Baseline  30 min with walker    Time  6    Period  Weeks    Status  On-going  Plan - 05/22/19 1623    Clinical Impression Statement  Pt progressing well towards POC.  Pt arrived with SPC, min cueing for cane placement and sequence, good stride length and heel to toe mechanics.  Began with bicycle, able to make full revolution.  ROM progressing well with AROM 0-110 degrees (was  2-100 degrees).  Pt able to complete SLR with no extension lag and good quad contraction.  Added STS and bridges for gluteal strengthening, did require elevated height to complete STS without HHA due to glueal weakness, able to demonstrate equal weight bearing.  No reports of pain thorugh session.    Personal Factors and Comorbidities  Age;Comorbidity 3+;Past/Current Experience;Fitness    Comorbidities  hypertension, Hx CVA, Diabetes, Depression, obesity    Examination-Activity Limitations  Bed Mobility;Bend;Carry;Locomotion Level;Sit;Sleep;Squat;Stairs;Stand;Lift;Transfers    Examination-Participation Restrictions  Church;Cleaning;Driving;Laundry;Meal Prep;Volunteer;Yard Work    Stability/Clinical Decision Making  Stable/Uncomplicated    Designer, jewellery  Low    Rehab Potential  Good    PT Frequency  3x / week    PT Duration  6 weeks    PT Treatment/Interventions  ADLs/Self Care Home Management;Aquatic Therapy;Cryotherapy;Electrical Stimulation;Iontophoresis 4mg /ml Dexamethasone;Moist Heat;Ultrasound;Contrast Bath;DME Instruction;Gait training;Stair training;Functional mobility training;Therapeutic activities;Therapeutic exercise;Balance training;Neuromuscular re-education;Patient/family education;Manual techniques;Orthotic Fit/Training;Manual lymph drainage;Compression bandaging;Scar mobilization;Passive range of motion;Dry needling;Energy conservation;Splinting;Taping;Vasopneumatic Device;Joint Manipulations    PT Next Visit Plan  Continue with Rt knee mobility (lacking 10 degrees flexion for full range.)  Next session continue wiht SLS and begin tandem stance as well as forward and lateral step ups.  Manual as needed.    PT Home Exercise Plan  05/16/19: R knee flexion stretch in seated       Patient will benefit from skilled therapeutic intervention in order to improve the following deficits and impairments:  Abnormal gait, Decreased activity tolerance, Decreased balance, Decreased  endurance, Decreased coordination, Decreased mobility, Decreased range of motion, Decreased strength, Difficulty walking, Increased edema, Increased fascial restricitons, Increased muscle spasms, Impaired flexibility, Obesity, Pain  Visit Diagnosis: Other abnormalities of gait and mobility  Muscle weakness (generalized)  Stiffness of right knee, not elsewhere classified  Right knee pain, unspecified chronicity     Problem List Patient Active Problem List   Diagnosis Date Noted  . S/P TKR (total knee replacement), right 03/23/2019 05/07/2019  . Primary osteoarthritis of right knee 04/02/2019  . Unilateral primary osteoarthritis, right knee   . Pedal edema 02/25/2019  . Acute vertigo with vomiting and inability to stand 10/08/2018  . Type 2 diabetes mellitus ([place]) 10/08/2018  . Carotid artery disease ([place]) 03/15/2018  . Cerebrovascular accident (CVA) ([place]) 11/16/2017  . Morbid obesity ([place]) 11/09/2017  . Low back pain 10/26/2017  . Screening mammogram, encounter for 09/18/2017  . CAD S/P percutaneous coronary angioplasty 11/04/2015  . NSTEMI (non-ST elevated myocardial infarction) ([place]) 11/03/2015  . Hypothyroidism 07/06/2010  . Hyperlipidemia LDL goal <100 08/28/2008  . Essential hypertension 11/13/2007  . 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 Diabetic peripheral neuropathy ([place]) 09/21/2006  . TOBACCO ABUSE 09/21/2006  . Adjustment disorder with mixed anxiety and depressed mood 09/21/2006  . CARPAL TUNNEL SYNDROME 09/21/2006  . COPD (chronic obstructive pulmonary disease) ([place]) 09/21/2006  . EDEMA 09/21/2006  . MIGRAINES, HX OF 09/21/2006   Ihor Austin, LPTA; CBIS [phone]  Aldona Lento 05/22/2019, 4:28 PM  [place] [address] [place], [place], [address] Phone: [phone]  Fax:   [phone]  Name: Jennifer [name] MRN: [id number] Date of Birth: 1956/05/28

## 2019-05-24 ENCOUNTER — Encounter (HOSPITAL_COMMUNITY): Payer: 59

## 2019-05-26 ENCOUNTER — Encounter (HOSPITAL_COMMUNITY): Payer: Self-pay | Admitting: Emergency Medicine

## 2019-05-26 ENCOUNTER — Emergency Department (HOSPITAL_COMMUNITY)
Admission: EM | Admit: 2019-05-26 | Discharge: 2019-05-27 | Disposition: A | Discharge status: Home / Self Care | Payer: 59 | Attending: Emergency Medicine | Admitting: Emergency Medicine

## 2019-05-26 ENCOUNTER — Other Ambulatory Visit: Payer: Self-pay

## 2019-05-26 DIAGNOSIS — Z794 Long term (current) use of insulin: Secondary | ICD-10-CM | POA: Diagnosis not present

## 2019-05-26 DIAGNOSIS — Z96651 Presence of right artificial knee joint: Secondary | ICD-10-CM | POA: Insufficient documentation

## 2019-05-26 DIAGNOSIS — E039 Hypothyroidism, unspecified: Secondary | ICD-10-CM | POA: Diagnosis not present

## 2019-05-26 DIAGNOSIS — Z79899 Other long term (current) drug therapy: Secondary | ICD-10-CM | POA: Insufficient documentation

## 2019-05-26 DIAGNOSIS — E119 Type 2 diabetes mellitus without complications: Secondary | ICD-10-CM | POA: Diagnosis not present

## 2019-05-26 DIAGNOSIS — J449 Chronic obstructive pulmonary disease, unspecified: Secondary | ICD-10-CM | POA: Diagnosis not present

## 2019-05-26 DIAGNOSIS — R339 Retention of urine, unspecified: Secondary | ICD-10-CM | POA: Diagnosis present

## 2019-05-26 DIAGNOSIS — I10 Essential (primary) hypertension: Secondary | ICD-10-CM | POA: Diagnosis not present

## 2019-05-26 DIAGNOSIS — I2510 Atherosclerotic heart disease of native coronary artery without angina pectoris: Secondary | ICD-10-CM | POA: Diagnosis not present

## 2019-05-26 DIAGNOSIS — Z87891 Personal history of nicotine dependence: Secondary | ICD-10-CM | POA: Insufficient documentation

## 2019-05-26 DIAGNOSIS — N3090 Cystitis, unspecified without hematuria: Secondary | ICD-10-CM

## 2019-05-26 DIAGNOSIS — Z7982 Long term (current) use of aspirin: Secondary | ICD-10-CM | POA: Diagnosis not present

## 2019-05-26 NOTE — ED Provider Notes (Signed)
[facility] EMERGENCY DEPARTMENT Provider Note   CSN: [id number] Arrival date & time: 05/26/19  2217     History Chief Complaint  Jennifer [name] presents with  . Urinary Retention    Jennifer Jennifer [name] is a 63 y.o. female.  HPI     Jennifer Jennifer [name] is a 63 year old female, history of coronary disease, hypertension, diabetes on an insulin pump status post knee replacement approximately 2 months ago.  She has a history of chronic lower extremity edema, she states that she does take fluid pill and review of Jennifer medical record shows that she takes 20 mg of Lasix a day as well as Eliquis.  She states that she has not had any urine since earlier today, she has felt some progressive discomfort starting around 5:00 and over Jennifer last 6 hours it has been persistent, located in Jennifer suprapubic region and not associated with any fevers chills diarrhea or vomiting though she does feel a little bit nauseated.  She has not had dysuria nor has she had urinary frequency in fact it is quite Jennifer opposite, she feels like she cannot urinate.  She has never had Jennifer problem of urinary retention in Jennifer past.  Past Medical History:  Diagnosis Date  . CAD (coronary artery disease)    cath 5/23 100% dist RCA lesion treated with 2 overlapping Integrity Resolute DES ( 2.25x26mm, 2.25x67mm), 60% mid RCA treated medically, 99% OM2 not amenable to PCI, 70% D1 lesion, EF normal  . Hypercholesteremia   . Hypertension   . Hypothyroidism   . Stroke ([place]) 10/2018  . Thyroid disease   . Type 2 diabetes mellitus ([place]) 10/08/2018    Jennifer [name] Active Problem List   Diagnosis Date Noted  . S/P TKR (total knee replacement), right 03/23/2019 05/07/2019  . Primary osteoarthritis of right knee 04/02/2019  . Unilateral primary osteoarthritis, right knee   . Pedal edema 02/25/2019  . Acute vertigo with vomiting and inability to stand 10/08/2018  . Type 2 diabetes mellitus ([place]) 10/08/2018  . Carotid artery disease ([place]) 03/15/2018  .  Cerebrovascular accident (CVA) ([place]) 11/16/2017  . Morbid obesity ([place]) 11/09/2017  . Low back pain 10/26/2017  . Screening mammogram, encounter for 09/18/2017  . CAD S/P percutaneous coronary angioplasty 11/04/2015  . NSTEMI (non-ST elevated myocardial infarction) ([place]) 11/03/2015  . Hypothyroidism 07/06/2010  . Hyperlipidemia LDL goal <100 08/28/2008  . Essential hypertension 11/13/2007  . 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 Diabetic peripheral neuropathy ([place]) 09/21/2006  . TOBACCO ABUSE 09/21/2006  . Adjustment disorder with mixed anxiety and depressed mood 09/21/2006  . CARPAL TUNNEL SYNDROME 09/21/2006  . COPD (chronic obstructive pulmonary disease) ([place]) 09/21/2006  . EDEMA 09/21/2006  . MIGRAINES, HX OF 09/21/2006    Past Surgical History:  Procedure Laterality Date  . ABDOMINAL HYSTERECTOMY    . CARDIAC CATHETERIZATION N/A 11/03/2015   Procedure: Left Heart Cath and Coronary Angiography;  Surgeon: Leonie Man, MD;  Location: [facility];  Service: Cardiovascular;  Laterality: N/A;  . CARDIAC CATHETERIZATION N/A 11/03/2015   Procedure: Coronary Stent Intervention;  Surgeon: Leonie Man, MD;  Location: [place] [facility] [facility];  Service: Cardiovascular;  Laterality: N/A;  . CARPAL TUNNEL RELEASE    . CORONARY ANGIOPLASTY     1 stent  . TOTAL KNEE ARTHROPLASTY Right 04/02/2019   Procedure: TOTAL KNEE ARTHROPLASTY;  Surgeon: Carole Civil, MD;  Location: [facility];  Service: Orthopedics;  Laterality: Right;     OB History   No obstetric history on file.     Family History  Problem Relation Age of Onset  . Heart disease Mother   . Stroke Sister   . Heart attack Brother     Social History   Tobacco Use  . Smoking status: Former Smoker    Packs/day: 0.25    Types: Cigarettes    Quit date: 10/07/2018    Years since quitting: 0.6  . Smokeless  tobacco: Never Used  Substance Use Topics  . Alcohol use: No    Alcohol/week: 0.0 standard drinks  . Drug use: No    Home Medications Prior to Admission medications   Medication Sig Start Date End Date Taking? Authorizing Provider  acetaminophen (TYLENOL) 325 MG tablet Take 2 tablets (650 mg total) by mouth every 4 (four) hours as needed for mild pain, fever or headache. 04/06/19 04/05/20  Roxan Hockey, MD  amLODipine (NORVASC) 10 MG tablet Take 1 tablet (10 mg total) by mouth daily. 04/06/19 10/03/19  Roxan Hockey, MD  apixaban (ELIQUIS) 2.5 MG TABS tablet Take 1 tablet (2.5 mg total) by mouth 2 (two) times daily. For clot prevention after right knee surgery-avoid aspirin while taking Eliquis/apixaban 04/06/19   Roxan Hockey, MD  aspirin EC 81 MG tablet Take 1 tablet (81 mg total) by mouth 2 (two) times daily. Hold aspirin while you are on apixaban/Eliquis 04/06/19   Emokpae, Courage, MD  atorvastatin (LIPITOR) 80 MG tablet TAKE 1 TABLET BY MOUTH ONCE DAILY AT  6  PM 04/22/19   O'Neal, Cassie Freer, MD  buPROPion [facility] SR) 150 MG 12 hr tablet Take 1 tablet by mouth twice daily 02/21/19   [name], [name], MD  carvedilol (COREG) 12.5 MG tablet Take 0.5 tablets (6.25 mg total) by mouth 2 (two) times daily with a meal. 04/06/19   Emokpae, Courage, MD  cephALEXin (KEFLEX) 500 MG capsule Take 1 capsule (500 mg total) by mouth 2 (two) times daily for 7 days. 05/27/19 06/03/19  Noemi Chapel, MD  DULoxetine (CYMBALTA) 60 MG capsule Take 1 capsule by mouth once daily 09/25/18   [name], [name], MD  Ezetimibe (ZETIA PO) Take by mouth.    [provider]  furosemide (LASIX) 20 MG tablet Take 1 tablet (20 mg total) by mouth daily. 01/03/19   Strader, Fransisco Hertz, PA-C  HYDROcodone-acetaminophen (NORCO/VICODIN) 5-325 MG tablet Take 1 tablet by mouth every 12 (twelve) hours as needed for moderate pain. 05/08/19   Carole Civil, MD  Insulin Human (INSULIN PUMP) SOLN Inject into Jennifer  skin continuous. Jennifer [name] may bolus up to 40 units at a time, with a maximum daily limit of 120 units    [provider]  levothyroxine (SYNTHROID) 137 MCG tablet Take 137 mcg by mouth daily before breakfast.    [provider]  meclizine (ANTIVERT) 25 MG tablet Take 1 tablet (25 mg total) by mouth 3 (three) times daily as needed for dizziness. 10/10/18   Barton Dubois, MD  nitroGLYCERIN (NITROSTAT) 0.4 MG SL tablet Place 1 tablet (0.4 mg total) under Jennifer tongue every 5 (five) minutes x 3 doses as needed for chest pain. 03/15/18   Erlene Quan, PA-C  ondansetron (ZOFRAN) 4 MG tablet Take 1 tablet (4 mg total) by mouth every 6 (six) hours as needed for nausea or vomiting. 04/06/19   Roxan Hockey, MD  senna-docusate (SENOKOT-S) 8.6-50 MG tablet Take 2 tablets by mouth at  bedtime. 04/06/19   Roxan Hockey, MD  sitaGLIPtin (JANUVIA) 100 MG tablet Take 100 mg by mouth daily.     [provider]    Allergies    Jennifer [name] has no known allergies.  Review of Systems   Review of Systems  All other systems reviewed and are negative.   Physical Exam Updated Vital Signs BP (!) 153/57   Pulse 84   Temp 97.7 F (36.5 C) (Oral)   Resp 18   Ht 1.702 m (5\' 7" )   Wt 118.8 kg   SpO2 99%   BMI 41.04 kg/m   Physical Exam Vitals and nursing note reviewed.  Constitutional:      General: She is not in acute distress.    Appearance: She is well-developed.  HENT:     Head: Normocephalic and atraumatic.     Mouth/Throat:     Pharynx: No oropharyngeal exudate.  Eyes:     General: No scleral icterus.       Right eye: No discharge.        Left eye: No discharge.     Conjunctiva/sclera: Conjunctivae normal.     Pupils: Pupils are equal, round, and reactive to light.  Neck:     Thyroid: No thyromegaly.     Vascular: No JVD.  Cardiovascular:     Rate and Rhythm: Normal rate and regular rhythm.     Heart sounds: Normal heart sounds. No murmur. No friction rub. No  gallop.   Pulmonary:     Effort: Pulmonary effort is normal. No respiratory distress.     Breath sounds: Normal breath sounds. No wheezing or rales.  Abdominal:     General: Bowel sounds are normal. There is no distension.     Palpations: Abdomen is soft. There is no mass.     Tenderness: There is abdominal tenderness.     Comments: There is minimal tenderness in Jennifer suprapubic region without significant fullness or masses, no other abdominal tenderness, Jennifer insulin pump is well-seated in Jennifer subcutaneous tissues of Jennifer abdominal wall  Musculoskeletal:        General: No tenderness. Normal range of motion.     Cervical back: Normal range of motion and neck supple.     Right lower leg: Edema present.     Left lower leg: Edema present.     Comments: Symmetrical bilateral lower extremity edema pitting  Lymphadenopathy:     Cervical: No cervical adenopathy.  Skin:    General: Skin is warm and dry.     Findings: No erythema or rash.  Neurological:     General: No focal deficit present.     Mental Status: She is alert.     Coordination: Coordination normal.     Comments: Jennifer Jennifer [name] is able to move all 4 extremities and has no slurred speech or facial droop, she is able to reposition her self in Jennifer bed quite easily  Psychiatric:        Behavior: Behavior normal.     ED Results / Procedures / Treatments   Labs (all labs ordered are listed, but only abnormal results are displayed) Labs Reviewed  URINALYSIS, ROUTINE W REFLEX MICROSCOPIC - Abnormal; Notable for Jennifer following components:      Result Value   APPearance HAZY (*)    Glucose, UA 50 (*)    Ketones, ur 5 (*)    Protein, ur >=300 (*)    Leukocytes,Ua TRACE (*)    WBC, UA >50 (*)  Bacteria, UA RARE (*)    All other components within normal limits  COMPREHENSIVE METABOLIC PANEL - Abnormal; Notable for Jennifer following components:   Glucose, Bld 178 (*)    BUN 24 (*)    Creatinine, Ser 1.47 (*)    Calcium 8.6 (*)    Total  Protein 6.1 (*)    Albumin 2.9 (*)    AST 14 (*)    GFR calc non Af Amer 37 (*)    GFR calc Af Amer 43 (*)    All other components within normal limits  CBC WITH DIFFERENTIAL/PLATELET - Abnormal; Notable for Jennifer following components:   Hemoglobin 11.8 (*)    All other components within normal limits  URINE CULTURE    EKG None  Radiology No results found.  Procedures Procedures (including critical care time)  Medications Ordered in ED Medications  cephALEXin (KEFLEX) capsule 500 mg (has no administration in time range)    ED Course  I have reviewed Jennifer triage vital signs and Jennifer nursing notes.  Pertinent labs & imaging results that were available during my care of Jennifer Jennifer [name] were reviewed by me and considered in my medical decision making (see chart for details).  Clinical Course as of May 27 131  Mon May 27, 2019  0129 Urinalysis reveals greater than 50 white blood cells, rare bacteria, nitrite negative.  There is proteinuria with some small ketones.  Creatinine is 1.47   [BM]  0129 BUN is 24, Jennifer creatinine and BUN are slightly higher than previously however she has fluctuated around these numbers for some time.  Liver function does show some poor synthetic function with low total protein and low albumin.  CBC shows no leukocytosis.  We will proceed with antibiotics, PO fluids, Jennifer [name] otherwise appears stable for discharge, culture pending   [BM]    Clinical Course User Index [BM] Noemi Chapel, MD   MDM Rules/Calculators/A&P   This Jennifer [name] has what appears to be some suprapubic discomfort, urinary retention however on bedside ultrasound there appears to be approximately 150 cc of urine, she will need an in and out catheterization with a urinalysis and a urine culture, check renal function as well as CBC to see why also she may not be having any urinary output for example renal disease.  Jennifer Jennifer [name] is agreeable to Jennifer plan, she is afebrile, her heart rate is 81 and she  appears otherwise in no distress.  Final Clinical Impression(s) / ED Diagnoses Final diagnoses:  Cystitis    Rx / DC Orders ED Discharge Orders         Ordered    cephALEXin (KEFLEX) 500 MG capsule  2 times daily     05/27/19 0131           Noemi Chapel, MD 05/27/19 [phone]

## 2019-05-26 NOTE — ED Triage Notes (Signed)
Pt c/o unable to urinate since 5pm this afternoon. Also c/o lower abd pain since this started.

## 2019-05-27 LAB — CBC WITH DIFFERENTIAL/PLATELET
Abs Immature Granulocytes: 0.03 10*3/uL (ref 0.00–0.07)
Basophils Absolute: 0 10*3/uL (ref 0.0–0.1)
Basophils Relative: 1 %
Eosinophils Absolute: 0.1 10*3/uL (ref 0.0–0.5)
Eosinophils Relative: 1 %
HCT: 38.4 % (ref 36.0–46.0)
Hemoglobin: 11.8 g/dL — ABNORMAL LOW (ref 12.0–15.0)
Immature Granulocytes: 0 %
Lymphocytes Relative: 13 %
Lymphs Abs: 1 10*3/uL (ref 0.7–4.0)
MCH: 26.9 pg (ref 26.0–34.0)
MCHC: 30.7 g/dL (ref 30.0–36.0)
MCV: 87.5 fL (ref 80.0–100.0)
Monocytes Absolute: 0.5 10*3/uL (ref 0.1–1.0)
Monocytes Relative: 6 %
Neutro Abs: 6 10*3/uL (ref 1.7–7.7)
Neutrophils Relative %: 79 %
Platelets: 244 10*3/uL (ref 150–400)
RBC: 4.39 MIL/uL (ref 3.87–5.11)
RDW: 14 % (ref 11.5–15.5)
WBC: 7.6 10*3/uL (ref 4.0–10.5)
nRBC: 0 % (ref 0.0–0.2)

## 2019-05-27 LAB — COMPREHENSIVE METABOLIC PANEL
ALT: 14 U/L (ref 0–44)
AST: 14 U/L — ABNORMAL LOW (ref 15–41)
Albumin: 2.9 g/dL — ABNORMAL LOW (ref 3.5–5.0)
Alkaline Phosphatase: 93 U/L (ref 38–126)
Anion gap: 8 (ref 5–15)
BUN: 24 mg/dL — ABNORMAL HIGH (ref 8–23)
CO2: 26 mmol/L (ref 22–32)
Calcium: 8.6 mg/dL — ABNORMAL LOW (ref 8.9–10.3)
Chloride: 104 mmol/L (ref 98–111)
Creatinine, Ser: 1.47 mg/dL — ABNORMAL HIGH (ref 0.44–1.00)
GFR calc Af Amer: 43 mL/min — ABNORMAL LOW (ref >60)
GFR calc non Af Amer: 37 mL/min — ABNORMAL LOW (ref >60)
Glucose, Bld: 178 mg/dL — ABNORMAL HIGH (ref 70–99)
Potassium: 4 mmol/L (ref 3.5–5.1)
Sodium: 138 mmol/L (ref 135–145)
Total Bilirubin: 0.5 mg/dL (ref 0.3–1.2)
Total Protein: 6.1 g/dL — ABNORMAL LOW (ref 6.5–8.1)

## 2019-05-27 LAB — URINALYSIS, ROUTINE W REFLEX MICROSCOPIC
Bilirubin Urine: NEGATIVE
Glucose, UA: 50 mg/dL — AB
Hgb urine dipstick: NEGATIVE
Ketones, ur: 5 mg/dL — AB
Nitrite: NEGATIVE
Protein, ur: >=300 mg/dL — AB
Specific Gravity, Urine: 1.024 (ref 1.005–1.030)
WBC, UA: >50 WBC/hpf — ABNORMAL HIGH (ref 0–5)
pH: 5 (ref 5.0–8.0)

## 2019-05-27 MED ORDER — CEPHALEXIN 500 MG PO CAPS
500.0000 mg | ORAL_CAPSULE | Freq: Once | ORAL | Status: AC
  Administered 2019-05-27: 500 mg via ORAL
  Filled 2019-05-27: qty 1

## 2019-05-27 MED ORDER — CEPHALEXIN 500 MG PO CAPS: 500.0000 mg | ORAL_CAPSULE | Freq: Two times a day (BID) | ORAL | 0 refills | Status: AC

## 2019-05-27 NOTE — Discharge Instructions (Signed)
Your testing shows that you have a bladder infection.  It also shows that your kidneys are chronically mildly not working as well as they should.  There is no significant change from previous however you will need to drink plenty of liquids.  I would recommend up to 8 bottles of water a day, 16 ounce bottles.  Take Keflex twice a day for the next 7 days  Call your doctor to follow-up within 48 hours for recheck, emergency department for severe or worsening pain vomiting or fever or any other severe or concerning symptoms.

## 2019-05-28 ENCOUNTER — Ambulatory Visit (INDEPENDENT_AMBULATORY_CARE_PROVIDER_SITE_OTHER): Payer: 59 | Admitting: Family Medicine

## 2019-05-28 ENCOUNTER — Telehealth (HOSPITAL_COMMUNITY): Payer: Self-pay

## 2019-05-28 ENCOUNTER — Ambulatory Visit (HOSPITAL_COMMUNITY): Payer: 59

## 2019-05-28 ENCOUNTER — Other Ambulatory Visit: Payer: Self-pay

## 2019-05-28 ENCOUNTER — Encounter: Payer: Self-pay | Admitting: Family Medicine

## 2019-05-28 VITALS — BP 134/56 | HR 98 | Temp 97.1°F | Ht 67.0 in | Wt 262.1 lb

## 2019-05-28 DIAGNOSIS — R112 Nausea with vomiting, unspecified: Secondary | ICD-10-CM | POA: Insufficient documentation

## 2019-05-28 DIAGNOSIS — N3000 Acute cystitis without hematuria: Secondary | ICD-10-CM

## 2019-05-28 DIAGNOSIS — K5903 Drug induced constipation: Secondary | ICD-10-CM

## 2019-05-28 DIAGNOSIS — N390 Urinary tract infection, site not specified: Secondary | ICD-10-CM | POA: Insufficient documentation

## 2019-05-28 DIAGNOSIS — K5900 Constipation, unspecified: Secondary | ICD-10-CM | POA: Insufficient documentation

## 2019-05-28 MED ORDER — ONDANSETRON HCL 4 MG PO TABS: 4.0000 mg | ORAL_TABLET | Freq: Four times a day (QID) | ORAL | 1 refills | Status: DC | PRN

## 2019-05-28 NOTE — Assessment & Plan Note (Addendum)
Worse since her knee replacement During visit pt fees pressure to have BM and wants to go home urgently  Given zofran to prevent n/v  Enc fluids senekot prn Asked her to update [place]  Unsure if her n/v is related to that or uti or abx  Exam is re assuring

## 2019-05-28 NOTE — Assessment & Plan Note (Signed)
Pt has had n/v for several days- unsure if from abx (keflex) or the uti or her constipation  Refilled zofran and inst to take it and work on hydrating  Pending final urine cx should have plan re: change in abx if needed  Will follow closely  Discussed s/s of dehydration to watch for

## 2019-05-28 NOTE — Telephone Encounter (Signed)
She is sick with blader infection and r/s this apptment for 12/17 @1pm 

## 2019-05-28 NOTE — Progress Notes (Signed)
Subjective:    Patient ID: Jennifer [name], female    DOB: Feb 14, 1956, 63 y.o.   MRN: [id number]  This visit occurred during the SARS-CoV-2 public health emergency.  Safety protocols were in place, including screening questions prior to the visit, additional usage of staff PPE, and extensive cleaning of exam room while observing appropriate contact time as indicated for disinfecting solutions.    HPI Pt here for f/u of ED visit for cystitis on 12/13   Wt Readings from Last 3 Encounters:  05/28/19 262 lb 2 oz (118.9 kg)  05/26/19 262 lb (118.8 kg)  04/02/19 267 lb (121.1 kg)   41.05 kg/m   She presented with suprapubic discomfort after not urinating all day (felt like she could not urinate)  Mild nausea but no other GI symptoms and no fever   UA was positive for glucose, ketones, protein and wbc with rare bacteria   Lab Results  Component Value Date   CREATININE 1.47 (H) 05/27/2019   BUN 24 (H) 05/27/2019   NA 138 05/27/2019   K 4.0 05/27/2019   CL 104 05/27/2019   CO2 26 05/27/2019   Lab Results  Component Value Date   WBC 7.6 05/27/2019   HGB 11.8 (L) 05/27/2019   HCT 38.4 05/27/2019   MCV 87.5 05/27/2019   PLT 244 05/27/2019   glucose was 178 Bedside bladder US showed 150 cc of urine and in and out cath was performed  She was tx with cephalexin   Preliminary urine culture shows over 100,000 colonies of proteus mirabilis  Still pending sensitivities   Now has pain over bladder  Burns to urinate  No blood in urine  No fever   Has had some vomiting for the past 2 days  Trying hard to keep down water   Constipation - for the past several day  3 days (usually goes every day)  Now she feels like she could go  Has had trouble since her knee surgery   Patient Active Problem List   Diagnosis Date Noted  . UTI (urinary tract infection) 05/28/2019  . Nausea & vomiting 05/28/2019  . Constipation 05/28/2019  . S/P TKR (total knee replacement), right 03/23/2019  05/07/2019  . Primary osteoarthritis of right knee 04/02/2019  . Unilateral primary osteoarthritis, right knee   . Pedal edema 02/25/2019  . Acute vertigo with vomiting and inability to stand 10/08/2018  . Type 2 diabetes mellitus ([place]) 10/08/2018  . Carotid artery disease ([place]) 03/15/2018  . Cerebrovascular accident (CVA) ([place]) 11/16/2017  . Morbid obesity ([place]) 11/09/2017  . Low back pain 10/26/2017  . Screening mammogram, encounter for 09/18/2017  . CAD S/P percutaneous coronary angioplasty 11/04/2015  . NSTEMI (non-ST elevated myocardial infarction) ([place]) 11/03/2015  . Hypothyroidism 07/06/2010  . Hyperlipidemia LDL goal <100 08/28/2008  . Essential hypertension 11/13/2007  . 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 Diabetic peripheral neuropathy ([place]) 09/21/2006  . TOBACCO ABUSE 09/21/2006  . Adjustment disorder with mixed anxiety and depressed mood 09/21/2006  . CARPAL TUNNEL SYNDROME 09/21/2006  . COPD (chronic obstructive pulmonary disease) ([place]) 09/21/2006  . EDEMA 09/21/2006  . MIGRAINES, HX OF 09/21/2006   Past Medical History:  Diagnosis Date  . CAD (coronary artery disease)    cath 5/23 100% dist RCA lesion treated with 2 overlapping Integrity Resolute DES ( 2.25x67mm, 2.25x62mm), 60% mid RCA treated medically, 99% OM2 not amenable to  PCI, 70% D1 lesion, EF normal  . Hypercholesteremia   . Hypertension   . Hypothyroidism   . Stroke ([place]) 10/2018  . Thyroid disease   . Type 2 diabetes mellitus ([place]) 10/08/2018   Past Surgical History:  Procedure Laterality Date  . ABDOMINAL HYSTERECTOMY    . CARDIAC CATHETERIZATION N/A 11/03/2015   Procedure: Left Heart Cath and Coronary Angiography;  Surgeon: Leonie Man, MD;  Location: [facility];  Service: Cardiovascular;  Laterality: N/A;  . CARDIAC CATHETERIZATION N/A 11/03/2015   Procedure: Coronary Stent  Intervention;  Surgeon: Leonie Man, MD;  Location: [facility];  Service: Cardiovascular;  Laterality: N/A;  . CARPAL TUNNEL RELEASE    . CORONARY ANGIOPLASTY     1 stent  . TOTAL KNEE ARTHROPLASTY Right 04/02/2019   Procedure: TOTAL KNEE ARTHROPLASTY;  Surgeon: Carole Civil, MD;  Location: [facility];  Service: Orthopedics;  Laterality: Right;   Social History   Tobacco Use  . Smoking status: Former Smoker    Packs/day: 0.25    Types: Cigarettes    Quit date: 10/07/2018    Years since quitting: 0.6  . Smokeless tobacco: Never Used  Substance Use Topics  . Alcohol use: No    Alcohol/week: 0.0 standard drinks  . Drug use: No   Family History  Problem Relation Age of Onset  . Heart disease Mother   . Stroke Sister   . Heart attack Brother    No Known Allergies Current Outpatient Medications on File Prior to Visit  Medication Sig Dispense Refill  . acetaminophen (TYLENOL) 325 MG tablet Take 2 tablets (650 mg total) by mouth every 4 (four) hours as needed for mild pain, fever or headache. 20 tablet 2  . amLODipine (NORVASC) 10 MG tablet Take 1 tablet (10 mg total) by mouth daily. 30 tablet 5  . apixaban (ELIQUIS) 2.5 MG TABS tablet Take 1 tablet (2.5 mg total) by mouth 2 (two) times daily. For clot prevention after right knee surgery-avoid aspirin while taking Eliquis/apixaban 60 tablet 0  . aspirin EC 81 MG tablet Take 1 tablet (81 mg total) by mouth 2 (two) times daily. Hold aspirin while you are on apixaban/Eliquis 1 tablet 0  . atorvastatin (LIPITOR) 80 MG tablet TAKE 1 TABLET BY MOUTH ONCE DAILY AT  6  PM 90 tablet 2  . buPROPion (WELLBUTRIN SR) 150 MG 12 hr tablet Take 1 tablet by mouth twice daily 180 tablet 1  . carvedilol (COREG) 12.5 MG tablet Take 0.5 tablets (6.25 mg total) by mouth 2 (two) times daily with a meal. 30 tablet 3  . cephALEXin (KEFLEX) 500 MG capsule Take 1 capsule (500 mg total) by mouth 2 (two) times daily for 7 days. 14 capsule 0  .  DULoxetine (CYMBALTA) 60 MG capsule Take 1 capsule by mouth once daily 90 capsule 30  . Ezetimibe (ZETIA PO) Take by mouth.    . furosemide (LASIX) 20 MG tablet Take 1 tablet (20 mg total) by mouth daily. 90 tablet 2  . HYDROcodone-acetaminophen (NORCO/VICODIN) 5-325 MG tablet Take 1 tablet by mouth every 12 (twelve) hours as needed for moderate pain. 28 tablet 0  . Insulin Human (INSULIN PUMP) SOLN Inject into the skin continuous. Patient may bolus up to 40 units at a time, with a maximum daily limit of 120 units    . levothyroxine (SYNTHROID) 137 MCG tablet Take 137 mcg by mouth daily before breakfast.    . meclizine (ANTIVERT) 25 MG  tablet Take 1 tablet (25 mg total) by mouth 3 (three) times daily as needed for dizziness. 30 tablet 0  . nitroGLYCERIN (NITROSTAT) 0.4 MG SL tablet Place 1 tablet (0.4 mg total) under the tongue every 5 (five) minutes x 3 doses as needed for chest pain. 25 tablet 3  . senna-docusate (SENOKOT-S) 8.6-50 MG tablet Take 2 tablets by mouth at bedtime. 60 tablet 2  . sitaGLIPtin (JANUVIA) 100 MG tablet Take 100 mg by mouth daily.      No current facility-administered medications on file prior to visit.    Review of Systems  Constitutional: Positive for fatigue. Negative for activity change, appetite change, fever and unexpected weight change.  HENT: Negative for congestion, ear pain, rhinorrhea, sinus pressure and sore throat.   Eyes: Negative for pain, redness and visual disturbance.  Respiratory: Negative for cough, shortness of breath and wheezing.   Cardiovascular: Negative for chest pain and palpitations.  Gastrointestinal: Positive for constipation, nausea and vomiting. Negative for abdominal pain, anal bleeding, blood in stool, diarrhea and rectal pain.  Endocrine: Negative for polydipsia and polyuria.  Genitourinary: Positive for dysuria and urgency. Negative for difficulty urinating, frequency and hematuria.       Urine volume decreased initially  Now able  to urinate  Musculoskeletal: Positive for arthralgias. Negative for back pain and myalgias.       Knee pain and stiffness s/p TKA-gradually improving  Skin: Negative for pallor and rash.  Allergic/Immunologic: Negative for environmental allergies.  Neurological: Negative for dizziness, syncope and headaches.  Hematological: Negative for adenopathy. Does not bruise/bleed easily.  Psychiatric/Behavioral: Negative for decreased concentration and dysphoric mood. The patient is not nervous/anxious.        Objective:   Physical Exam Constitutional:      General: She is not in acute distress.    Appearance: Normal appearance. She is well-developed. She is obese. She is not ill-appearing.     Comments: Fatigued   HENT:     Head: Normocephalic and atraumatic.     Mouth/Throat:     Mouth: Mucous membranes are moist.  Eyes:     General: No scleral icterus.       Right eye: No discharge.        Left eye: No discharge.     Conjunctiva/sclera: Conjunctivae normal.     Pupils: Pupils are equal, round, and reactive to light.  Cardiovascular:     Rate and Rhythm: Regular rhythm. Tachycardia present.     Heart sounds: Normal heart sounds.  Pulmonary:     Effort: Pulmonary effort is normal. No respiratory distress.     Breath sounds: Normal breath sounds. No stridor. No wheezing, rhonchi or rales.  Abdominal:     General: Abdomen is protuberant. Bowel sounds are normal. There is no distension or abdominal bruit.     Palpations: Abdomen is soft.     Tenderness: There is abdominal tenderness in the suprapubic area. There is no right CVA tenderness, left CVA tenderness, guarding or rebound. Negative signs include McBurney's sign.     Hernia: No hernia is present.     Comments: No cva tenderness  Moderate suprapubic tenderness w/o rebound or guarding   Musculoskeletal:     Cervical back: Normal range of motion and neck supple. No rigidity.     Right lower leg: No edema.     Left lower leg: No  edema.     Comments: Walking with walker (s/p TKA) Gait is slow and steady  Lymphadenopathy:  Cervical: No cervical adenopathy.  Skin:    Coloration: Skin is not pale.     Findings: No erythema or rash.  Neurological:     Mental Status: She is alert.     Motor: No weakness.     Coordination: Coordination normal.     Deep Tendon Reflexes: Reflexes normal.  Psychiatric:        Mood and Affect: Mood normal.           Assessment & Plan:   Problem List Items Addressed This Visit      Digestive   Nausea & vomiting    Pt has had n/v for several days- unsure if from abx (keflex) or the uti or her constipation  Refilled zofran and inst to take it and work on hydrating  Pending final urine cx should have plan re: change in abx if needed  Will follow closely  Discussed s/s of dehydration to watch for         Genitourinary   UTI (urinary tract infection) - Primary    With recent trip to ED  Reviewed hospital records, lab results and studies in detail   On keflex  Prelim urine cx shows proteus (pending sensitivities) Having trouble with n/v (may be multi factorial)  Disc imp of fluid intake / zofran px  Will follow closely         Other   Constipation    Worse since her knee replacement During visit pt fees pressure to have BM and wants to go home urgently  Given zofran to prevent n/v  Enc fluids senekot prn Asked her to update [place]  Unsure if her n/v is related to that or uti or abx  Exam is re assuring

## 2019-05-28 NOTE — Patient Instructions (Signed)
Take the zofran for nausea  Try to get some fluids in  A cracker with the antibiotic may help   If you get very dry (rapid heart beat/dizziness)- you may need IV fluids at the hospital   As soon as we get the final culture back we will call you   Keep [place] posted   If constipation does not improve also let us know

## 2019-05-28 NOTE — Assessment & Plan Note (Signed)
With recent trip to ED  Reviewed hospital records, lab results and studies in detail   On keflex  Prelim urine cx shows proteus (pending sensitivities) Having trouble with n/v (may be multi factorial)  Disc imp of fluid intake / zofran px  Will follow closely

## 2019-05-29 ENCOUNTER — Ambulatory Visit (HOSPITAL_COMMUNITY): Payer: 59 | Admitting: Physical Therapy

## 2019-05-29 ENCOUNTER — Telehealth (HOSPITAL_COMMUNITY): Payer: Self-pay | Admitting: Physical Therapy

## 2019-05-29 LAB — URINE CULTURE: Culture: >=100000 — AB

## 2019-05-29 NOTE — Telephone Encounter (Signed)
Patient is sick today and had to cancel

## 2019-05-30 ENCOUNTER — Telehealth (HOSPITAL_COMMUNITY): Payer: Self-pay | Admitting: Physical Therapy

## 2019-05-30 ENCOUNTER — Ambulatory Visit (HOSPITAL_COMMUNITY): Payer: 59 | Admitting: Physical Therapy

## 2019-05-30 ENCOUNTER — Telehealth: Payer: Self-pay

## 2019-05-30 NOTE — Telephone Encounter (Signed)
Pt cancelled due to sickness °

## 2019-05-30 NOTE — Telephone Encounter (Signed)
Post ED Visit - Positive Culture Follow-up  Culture report reviewed by antimicrobial stewardship pharmacist: [facility] []  Elenor Quinones, Pharm.D. []  Heide Guile, Pharm.D., BCPS AQ-ID []  Parks Neptune, Pharm.D., BCPS []  Alycia Rossetti, Pharm.D., BCPS []  [place], [place].D., BCPS, AAHIVP []  Legrand Como, Pharm.D., BCPS, AAHIVP []  Salome Arnt, PharmD, BCPS []  Johnnette Gourd, PharmD, BCPS []  Hughes Better, PharmD, BCPS []  Leeroy Cha, PharmD []  Laqueta Linden, PharmD, BCPS []  Albertina Parr, PharmD [facility] Team []  Leodis Sias, PharmD []  Lindell Spar, PharmD []  Royetta Asal, PharmD []  Graylin Shiver, Rph []  Rema Fendt) Glennon Mac, PharmD []  Arlyn Dunning, PharmD []  Netta Cedars, PharmD []  Dia Sitter, PharmD []  Leone Haven, PharmD []  Gretta Arab, PharmD []  Theodis Shove, PharmD []  Peggyann Juba, PharmD []  Reuel Boom, PharmD   Positive urine culture Treated with Cephalexin, organism sensitive to the same and no further patient follow-up is required at this time.  Genia Del 05/30/2019, 9:56 AM

## 2019-05-31 ENCOUNTER — Encounter

## 2019-06-03 ENCOUNTER — Ambulatory Visit (HOSPITAL_COMMUNITY): Payer: 59 | Admitting: Physical Therapy

## 2019-06-03 ENCOUNTER — Other Ambulatory Visit: Payer: Self-pay

## 2019-06-03 DIAGNOSIS — M25661 Stiffness of right knee, not elsewhere classified: Secondary | ICD-10-CM

## 2019-06-03 DIAGNOSIS — M6281 Muscle weakness (generalized): Secondary | ICD-10-CM

## 2019-06-03 DIAGNOSIS — R2689 Other abnormalities of gait and mobility: Secondary | ICD-10-CM

## 2019-06-03 DIAGNOSIS — M25561 Pain in right knee: Secondary | ICD-10-CM

## 2019-06-03 NOTE — Therapy (Signed)
[facility] [place], [place], [address] Phone: [phone]   Fax:  [phone]  Physical Therapy Treatment  Patient Details  Name: Jennifer [name] MRN: [id number] Date of Birth: 1956/06/02 Referring Provider (PT): Arther Abbott MD   Encounter Date: 06/03/2019  PT End of Session - 06/03/19 1235    Visit Number  4    Number of Visits  18    Date for PT Re-Evaluation  06/27/19   Minireassess 06/06/19   Authorization Type  [employer]    Authorization Time Period  05/16/19-06/27/2019 (no auth req)    Authorization - Visit Number  4    Authorization - Number of Visits  10    PT Start Time  [id number]    PT Stop Time  1215    PT Time Calculation (min)  40 min    Activity Tolerance  Patient tolerated treatment well    Behavior During Therapy  [facility] for tasks assessed/performed       Past Medical History:  Diagnosis Date  . CAD (coronary artery disease)    cath 5/23 100% dist RCA lesion treated with 2 overlapping Integrity Resolute DES ( 2.25x4mm, 2.25x86mm), 60% mid RCA treated medically, 99% OM2 not amenable to PCI, 70% D1 lesion, EF normal  . Hypercholesteremia   . Hypertension   . Hypothyroidism   . Stroke ([place]) 10/2018  . Thyroid disease   . Type 2 diabetes mellitus ([place]) 10/08/2018    Past Surgical History:  Procedure Laterality Date  . ABDOMINAL HYSTERECTOMY    . CARDIAC CATHETERIZATION N/A 11/03/2015   Procedure: Left Heart Cath and Coronary Angiography;  Surgeon: Leonie Man, MD;  Location: [facility];  Service: Cardiovascular;  Laterality: N/A;  . CARDIAC CATHETERIZATION N/A 11/03/2015   Procedure: Coronary Stent Intervention;  Surgeon: Leonie Man, MD;  Location: [facility];  Service: Cardiovascular;  Laterality: N/A;  . CARPAL TUNNEL RELEASE    . CORONARY ANGIOPLASTY     1 stent  . TOTAL KNEE ARTHROPLASTY Right 04/02/2019   Procedure: TOTAL KNEE ARTHROPLASTY;  Surgeon: Carole Civil, MD;  Location: [facility];  Service: Orthopedics;  Laterality: Right;    There were no vitals filed for this visit.  Subjective Assessment - 06/03/19 1148    Subjective  pt has missed last 3 appointment due to severe bladder infection.  Returns today without any pain and no longer using AD for ambulation.    Currently in Pain?  No/denies                       [facility] Adult PT Treatment/Exercise - 06/03/19 0001      Knee/Hip Exercises: Stretches   Knee: Self-Stretch to increase Flexion  Right;10 seconds    Knee: Self-Stretch Limitations  10 reps onto 12" box    Gastroc Stretch  3 reps;30 seconds    Gastroc Stretch Limitations  slant board      Knee/Hip Exercises: Aerobic   Stationary Bike  seat 13 x 4 min for mobility, able to make full revolution      Knee/Hip Exercises: Standing   Heel Raises  Both;20 reps    Knee Flexion  Right;10 reps    Lateral Step Up  Right;10 reps;Step Height: 4";Hand Hold: 1    Forward Step Up  Right;10 reps;Hand Hold: 1;Step Height: 4"    Stairs  2RT with 1 HR and reciprocal.  Gait Training  no AD      Knee/Hip Exercises: Supine   Straight Leg Raises  15 reps    Straight Leg Raises Limitations  SLR with no extension lag    Knee Extension  AROM    Knee Extension Limitations  0    Knee Flexion  AROM    Knee Flexion Limitations  117             PT Education - 06/03/19 1253    Education Details  educated on lymphedema, how to manage with compression garments.  Measured LE and given information flier to call [facility] to order    Person(s) Educated  Patient    Methods  Explanation;Handout    Comprehension  Verbalized understanding       PT Short Term Goals - 05/20/19 1528      PT SHORT TERM GOAL #1   Title  Patient will be independent with HEP in order to optimze functional outcomes.    Time  3    Period  Weeks    Status  On-going    Target Date  06/06/19        PT Long Term Goals - 05/20/19 1528       PT LONG TERM GOAL #1   Title  Patient will ambulate with gait speed of at least 0.8 m/s with least restrictive device in order to ambulate in the community.    Baseline  .56 m/s    Time  6    Period  Weeks    Status  On-going      PT LONG TERM GOAL #2   Title  Patient will improve FOTO score by at least 10% in order to demonstrate improved tolerance to activity.    Baseline  41%    Time  6    Period  Weeks    Status  On-going      PT LONG TERM GOAL #3   Title  Patient will report at least 75% improvement of symptoms for improved quality of life.    Time  6    Period  Weeks    Status  On-going      PT LONG TERM GOAL #4   Title  Patient will be able to walk for at least 30 minutes without AD in order to grocery shop.    Baseline  30 min with walker    Time  6    Period  Weeks    Status  On-going            Plan - 06/03/19 1236    Clinical Impression Statement  pt returns today after missing previous 3 appts due to bladder infection.  Now ambulating without AD without pain, only c/o some stiffness when transitioning from prolonged inactivity.  ROM today improved to 0-117 and functionally improving.  Pt still with tendency to negotiate steps individually and some balance issues.  To focus mainly on these issues moving forward.  Noted to have edema/induration in distal LE's, Lt>Rt.  Pt given information on compression stockings as well as measuremetns to call [facility] to order.  Urged patient ot do this and she agreed.    Personal Factors and Comorbidities  Age;Comorbidity 3+;Past/Current Experience;Fitness    Comorbidities  hypertension, Hx CVA, Diabetes, Depression, obesity    Examination-Activity Limitations  Bed Mobility;Bend;Carry;Locomotion Level;Sit;Sleep;Squat;Stairs;Stand;Lift;Transfers    Examination-Participation Restrictions  Church;Cleaning;Driving;Laundry;Meal Prep;Volunteer;Yard Work    Stability/Clinical Decision Making  Stable/Uncomplicated    Rehab  Potential  Good    PT Frequency  3x / week    PT Duration  6 weeks    PT Treatment/Interventions  ADLs/Self Care Home Management;Aquatic Therapy;Cryotherapy;Electrical Stimulation;Iontophoresis 4mg /ml Dexamethasone;Moist Heat;Ultrasound;Contrast Bath;DME Instruction;Gait training;Stair training;Functional mobility training;Therapeutic activities;Therapeutic exercise;Balance training;Neuromuscular re-education;Patient/family education;Manual techniques;Orthotic Fit/Training;Manual lymph drainage;Compression bandaging;Scar mobilization;Passive range of motion;Dry needling;Energy conservation;Splinting;Taping;Vasopneumatic Device;Joint Manipulations    PT Next Visit Plan  Discontinue ROM focus and increase focus on functional strengthening and balance.    PT Home Exercise Plan  05/16/19: R knee flexion stretch in seated       Patient will benefit from skilled therapeutic intervention in order to improve the following deficits and impairments:  Abnormal gait, Decreased activity tolerance, Decreased balance, Decreased endurance, Decreased coordination, Decreased mobility, Decreased range of motion, Decreased strength, Difficulty walking, Increased edema, Increased fascial restricitons, Increased muscle spasms, Impaired flexibility, Obesity, Pain  Visit Diagnosis: Other abnormalities of gait and mobility  Muscle weakness (generalized)  Stiffness of right knee, not elsewhere classified  Right knee pain, unspecified chronicity     Problem List Patient Active Problem List   Diagnosis Date Noted  . UTI (urinary tract infection) 05/28/2019  . Nausea & vomiting 05/28/2019  . Constipation 05/28/2019  . S/P TKR (total knee replacement), right 03/23/2019 05/07/2019  . Primary osteoarthritis of right knee 04/02/2019  . Unilateral primary osteoarthritis, right knee   . Pedal edema 02/25/2019  . Acute vertigo with vomiting and inability to stand 10/08/2018  . Type 2 diabetes mellitus ([place]) 10/08/2018   . Carotid artery disease ([place]) 03/15/2018  . Cerebrovascular accident (CVA) ([place]) 11/16/2017  . Morbid obesity ([place]) 11/09/2017  . Low back pain 10/26/2017  . Screening mammogram, encounter for 09/18/2017  . CAD S/P percutaneous coronary angioplasty 11/04/2015  . NSTEMI (non-ST elevated myocardial infarction) ([place]) 11/03/2015  . Hypothyroidism 07/06/2010  . Hyperlipidemia LDL goal <100 08/28/2008  . Essential hypertension 11/13/2007  . 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 Diabetic peripheral neuropathy ([place]) 09/21/2006  . TOBACCO ABUSE 09/21/2006  . Adjustment disorder with mixed anxiety and depressed mood 09/21/2006  . CARPAL TUNNEL SYNDROME 09/21/2006  . COPD (chronic obstructive pulmonary disease) ([place]) 09/21/2006  . EDEMA 09/21/2006  . MIGRAINES, HX OF 09/21/2006   Teena Irani, PTA/CLT [phone]  Teena Irani 06/03/2019, 12:54 PM  [facility] [address] [place], [place], [address] Phone: [phone]   Fax:  [phone]  Name: AROUSH RANALLI MRN: [id number] Date of Birth: 1955-07-12

## 2019-06-05 ENCOUNTER — Ambulatory Visit (HOSPITAL_COMMUNITY): Payer: 59

## 2019-06-05 ENCOUNTER — Encounter (HOSPITAL_COMMUNITY): Payer: Self-pay

## 2019-06-05 ENCOUNTER — Other Ambulatory Visit: Payer: Self-pay

## 2019-06-05 DIAGNOSIS — M6281 Muscle weakness (generalized): Secondary | ICD-10-CM

## 2019-06-05 DIAGNOSIS — M25561 Pain in right knee: Secondary | ICD-10-CM

## 2019-06-05 DIAGNOSIS — R2689 Other abnormalities of gait and mobility: Secondary | ICD-10-CM | POA: Diagnosis not present

## 2019-06-05 DIAGNOSIS — M25661 Stiffness of right knee, not elsewhere classified: Secondary | ICD-10-CM

## 2019-06-05 NOTE — Therapy (Signed)
[place], [place], [address] Phone: [phone]   Fax:  [phone]  Physical Therapy Treatment  Patient Details  Name: Jennifer [name] MRN: [id number] Date of Birth: 12/27/1955 Referring Provider (PT): Arther Abbott MD   Encounter Date: 06/05/2019  PT End of Session - 06/05/19 1317    Visit Number  5    Number of Visits  18    Date for PT Re-Evaluation  06/27/19   Minireassess 06/06/19   Authorization Type  [employer]    Authorization Time Period  05/16/19-06/27/2019 (no auth req)    Authorization - Visit Number  5    Authorization - Number of Visits  10    PT Start Time  [id number]    PT Stop Time  1353    PT Time Calculation (min)  41 min    Equipment Utilized During Treatment  --   SBA/CGA during balance activities   Activity Tolerance  Patient tolerated treatment well    Behavior During Therapy  [facility] for tasks assessed/performed       Past Medical History:  Diagnosis Date  . CAD (coronary artery disease)    cath 5/23 100% dist RCA lesion treated with 2 overlapping Integrity Resolute DES ( 2.25x48mm, 2.25x62mm), 60% mid RCA treated medically, 99% OM2 not amenable to PCI, 70% D1 lesion, EF normal  . Hypercholesteremia   . Hypertension   . Hypothyroidism   . Stroke ([place]) 10/2018  . Thyroid disease   . Type 2 diabetes mellitus ([place]) 10/08/2018    Past Surgical History:  Procedure Laterality Date  . ABDOMINAL HYSTERECTOMY    . CARDIAC CATHETERIZATION N/A 11/03/2015   Procedure: Left Heart Cath and Coronary Angiography;  Surgeon: Leonie Man, MD;  Location: [facility];  Service: Cardiovascular;  Laterality: N/A;  . CARDIAC CATHETERIZATION N/A 11/03/2015   Procedure: Coronary Stent Intervention;  Surgeon: Leonie Man, MD;  Location: [facility];  Service: Cardiovascular;  Laterality: N/A;  . CARPAL TUNNEL RELEASE    . CORONARY ANGIOPLASTY     1 stent  . TOTAL KNEE ARTHROPLASTY  Right 04/02/2019   Procedure: TOTAL KNEE ARTHROPLASTY;  Surgeon: Carole Civil, MD;  Location: [facility];  Service: Orthopedics;  Laterality: Right;    There were no vitals filed for this visit.  Subjective Assessment - 06/05/19 1312    Subjective  Pt stated she is feeling good today, no reports of pain today.  Stated balance most difficult, does have occassional stumble, no reports of pain today.    Pertinent History  hypertension, Hx CVA, Diabetes, Depression    Patient Stated Goals  walk without walker or cane    Currently in Pain?  No/denies                       [facility] Adult PT Treatment/Exercise - 06/05/19 0001      Knee/Hip Exercises: Standing   Heel Raises  Both;20 reps    Heel Raises Limitations  slope with toe raises    Lateral Step Up  Right;10 reps;Step Height: 4";Hand Hold: 1    Forward Step Up  Right;10 reps;Hand Hold: 1;Step Height: 4";Step Height: 6";2 sets    Step Down  Right;10 reps;Hand Hold: 2;Step Height: 4"    Functional Squat  10 reps    Functional Squat Limitations  cueing infront of chair    Stairs  3RT with 1 HR, 4in step height  SLS  3" max BLE    Other Standing Knee Exercises  2RT sidestep wiht RTB    Other Standing Knee Exercises  tandem stance 3x 30"      Knee/Hip Exercises: Seated   Sit to Sand  10 reps;without UE support               PT Short Term Goals - 05/20/19 1528      PT SHORT TERM GOAL #1   Title  Patient will be independent with HEP in order to optimze functional outcomes.    Time  3    Period  Weeks    Status  On-going    Target Date  06/06/19        PT Long Term Goals - 05/20/19 1528      PT LONG TERM GOAL #1   Title  Patient will ambulate with gait speed of at least 0.8 m/s with least restrictive device in order to ambulate in the community.    Baseline  .56 m/s    Time  6    Period  Weeks    Status  On-going      PT LONG TERM GOAL #2   Title  Patient will improve FOTO score by at least 10%  in order to demonstrate improved tolerance to activity.    Baseline  41%    Time  6    Period  Weeks    Status  On-going      PT LONG TERM GOAL #3   Title  Patient will report at least 75% improvement of symptoms for improved quality of life.    Time  6    Period  Weeks    Status  On-going      PT LONG TERM GOAL #4   Title  Patient will be able to walk for at least 30 minutes without AD in order to grocery shop.    Baseline  30 min with walker    Time  6    Period  Weeks    Status  On-going            Plan - 06/05/19 1358    Clinical Impression Statement  Progressed session focus with functional strengthening.  Added squats, progressed step up training and balance activities.  Verbal cueing and demonstrate to improve mechanics wiht squats in front of chair and encouraged eccentric control for quad strengthening.  SBA/CGA with balance activities, some HHA required with SLS activities.    Personal Factors and Comorbidities  Age;Comorbidity 3+;Past/Current Experience;Fitness    Comorbidities  hypertension, Hx CVA, Diabetes, Depression, obesity    Examination-Activity Limitations  Bed Mobility;Bend;Carry;Locomotion Level;Sit;Sleep;Squat;Stairs;Stand;Lift;Transfers    Examination-Participation Restrictions  Church;Cleaning;Driving;Laundry;Meal Prep;Volunteer;Yard Work    Stability/Clinical Decision Making  Stable/Uncomplicated    Designer, jewellery  Low    Rehab Potential  Good    PT Frequency  3x / week    PT Duration  6 weeks    PT Treatment/Interventions  ADLs/Self Care Home Management;Aquatic Therapy;Cryotherapy;Electrical Stimulation;Iontophoresis 4mg /ml Dexamethasone;Moist Heat;Ultrasound;Contrast Bath;DME Instruction;Gait training;Stair training;Functional mobility training;Therapeutic activities;Therapeutic exercise;Balance training;Neuromuscular re-education;Patient/family education;Manual techniques;Orthotic Fit/Training;Manual lymph drainage;Compression bandaging;Scar  mobilization;Passive range of motion;Dry needling;Energy conservation;Splinting;Taping;Vasopneumatic Device;Joint Manipulations    PT Next Visit Plan  Continue functional strengthening and balance.  Added vector stance and possible paloff.    PT Home Exercise Plan  05/16/19: R knee flexion stretch in seated; 06/05/19: tandem stance and SLS with HHA PRN       Patient will benefit from skilled therapeutic  intervention in order to improve the following deficits and impairments:  Abnormal gait, Decreased activity tolerance, Decreased balance, Decreased endurance, Decreased coordination, Decreased mobility, Decreased range of motion, Decreased strength, Difficulty walking, Increased edema, Increased fascial restricitons, Increased muscle spasms, Impaired flexibility, Obesity, Pain  Visit Diagnosis: Stiffness of right knee, not elsewhere classified  Right knee pain, unspecified chronicity  Muscle weakness (generalized)  Other abnormalities of gait and mobility     Problem List Patient Active Problem List   Diagnosis Date Noted  . UTI (urinary tract infection) 05/28/2019  . Nausea & vomiting 05/28/2019  . Constipation 05/28/2019  . S/P TKR (total knee replacement), right 03/23/2019 05/07/2019  . Primary osteoarthritis of right knee 04/02/2019  . Unilateral primary osteoarthritis, right knee   . Pedal edema 02/25/2019  . Acute vertigo with vomiting and inability to stand 10/08/2018  . Type 2 diabetes mellitus ([place]) 10/08/2018  . Carotid artery disease ([place]) 03/15/2018  . Cerebrovascular accident (CVA) ([place]) 11/16/2017  . Morbid obesity ([place]) 11/09/2017  . Low back pain 10/26/2017  . Screening mammogram, encounter for 09/18/2017  . CAD S/P percutaneous coronary angioplasty 11/04/2015  . NSTEMI (non-ST elevated myocardial infarction) ([place]) 11/03/2015  . Hypothyroidism 07/06/2010  . Hyperlipidemia LDL goal <100 08/28/2008  . Essential hypertension 11/13/2007  . 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 Diabetic peripheral neuropathy ([place]) 09/21/2006  . TOBACCO ABUSE 09/21/2006  . Adjustment disorder with mixed anxiety and depressed mood 09/21/2006  . CARPAL TUNNEL SYNDROME 09/21/2006  . COPD (chronic obstructive pulmonary disease) ([place]) 09/21/2006  . EDEMA 09/21/2006  . MIGRAINES, HX OF 09/21/2006   Ihor Austin, LPTA; CBIS [phone]  Aldona Lento 06/05/2019, 2:05 PM  [facility] [address] [place], [place], [address] Phone: [phone]   Fax:  [phone]  Name: Jennifer [name] MRN: [id number] Date of Birth: 05/12/1956

## 2019-06-05 NOTE — Patient Instructions (Signed)
Tandem Stance    Right foot in front of left, heel touching toe both feet "straight ahead". Stand on Foot Triangle of Support with both feet. Balance in this position 30 seconds. Do with left foot in front of right.  Copyright  VHI. All rights reserved.   Single Leg Balance: Eyes Open    Stand on right leg with eyes open. Hold 30 seconds. 5 reps daily.  http://ggbe.exer.us/5   Copyright  VHI. All rights reserved.    

## 2019-06-06 ENCOUNTER — Ambulatory Visit (HOSPITAL_COMMUNITY): Payer: 59 | Admitting: Physical Therapy

## 2019-06-06 DIAGNOSIS — M25661 Stiffness of right knee, not elsewhere classified: Secondary | ICD-10-CM

## 2019-06-06 DIAGNOSIS — M6281 Muscle weakness (generalized): Secondary | ICD-10-CM

## 2019-06-06 DIAGNOSIS — R2689 Other abnormalities of gait and mobility: Secondary | ICD-10-CM

## 2019-06-06 DIAGNOSIS — M25561 Pain in right knee: Secondary | ICD-10-CM

## 2019-06-06 NOTE — Therapy (Signed)
[place], [place], [address] Phone: [phone]   Fax:  [phone]  Physical Therapy Treatment  Patient Details  Name: Jennifer [name] MRN: [id number] Date of Birth: 05-17-1956 Referring Provider (PT): Arther Abbott MD   Encounter Date: 06/06/2019  PT End of Session - 06/06/19 1008    Visit Number  6    Number of Visits  18    Date for PT Re-Evaluation  06/27/19   Minireassess 06/06/19   Authorization Type  [employer]    Authorization Time Period  05/16/19-06/27/2019 (no auth req)    Authorization - Visit Number  6    Authorization - Number of Visits  10    PT Start Time  [phone]    PT Stop Time  0958    PT Time Calculation (min)  34 min    Equipment Utilized During Treatment  --   SBA/CGA during balance activities   Activity Tolerance  Patient tolerated treatment well    Behavior During Therapy  [facility] for tasks assessed/performed       Past Medical History:  Diagnosis Date  . CAD (coronary artery disease)    cath 5/23 100% dist RCA lesion treated with 2 overlapping Integrity Resolute DES ( 2.25x9mm, 2.25x40mm), 60% mid RCA treated medically, 99% OM2 not amenable to PCI, 70% D1 lesion, EF normal  . Hypercholesteremia   . Hypertension   . Hypothyroidism   . Stroke ([place]) 10/2018  . Thyroid disease   . Type 2 diabetes mellitus ([place]) 10/08/2018    Past Surgical History:  Procedure Laterality Date  . ABDOMINAL HYSTERECTOMY    . CARDIAC CATHETERIZATION N/A 11/03/2015   Procedure: Left Heart Cath and Coronary Angiography;  Surgeon: Leonie Man, MD;  Location: [facility];  Service: Cardiovascular;  Laterality: N/A;  . CARDIAC CATHETERIZATION N/A 11/03/2015   Procedure: Coronary Stent Intervention;  Surgeon: Leonie Man, MD;  Location: [facility];  Service: Cardiovascular;  Laterality: N/A;  . CARPAL TUNNEL RELEASE    . CORONARY ANGIOPLASTY     1 stent  . TOTAL KNEE ARTHROPLASTY  Right 04/02/2019   Procedure: TOTAL KNEE ARTHROPLASTY;  Surgeon: Carole Civil, MD;  Location: [facility];  Service: Orthopedics;  Laterality: Right;    There were no vitals filed for this visit.  Subjective Assessment - 06/06/19 0926    Subjective  pt states her legs are extremely sore from yesterdays treatment.  Pt pointing to quads bilaterally.  Noted antalgia when ambulating back into clinic.    Currently in Pain?  No/denies                       [facility] Adult PT Treatment/Exercise - 06/06/19 0001      Knee/Hip Exercises: Standing   Heel Raises  Both;20 reps    Heel Raises Limitations  slope with toe raises    Lateral Step Up  Right;10 reps;Step Height: 4";Hand Hold: 1    Forward Step Up  Right;10 reps;Hand Hold: 1;Step Height: 4";Step Height: 6";2 sets    Step Down  Right;10 reps;Hand Hold: 2;Step Height: 4"    Functional Squat  10 reps    Functional Squat Limitations  cueing infront of chair    Stairs  3RT with 1 HR, 4in step height    SLS  3" max BLE    Other Standing Knee Exercises  2RT sidestep (no TB this session)    Other  Standing Knee Exercises  tandem stance 3x 30"   ability to maintain max of 25 seconds     Knee/Hip Exercises: Seated   Sit to Sand  10 reps;without UE support      Knee/Hip Exercises: Supine   Knee Extension  AROM    Knee Extension Limitations  0    Knee Flexion  AROM    Knee Flexion Limitations  106   seated              PT Short Term Goals - 05/20/19 1528      PT SHORT TERM GOAL #1   Title  Patient will be independent with HEP in order to optimze functional outcomes.    Time  3    Period  Weeks    Status  On-going    Target Date  06/06/19        PT Long Term Goals - 05/20/19 1528      PT LONG TERM GOAL #1   Title  Patient will ambulate with gait speed of at least 0.8 m/s with least restrictive device in order to ambulate in the community.    Baseline  .56 m/s    Time  6    Period  Weeks    Status   On-going      PT LONG TERM GOAL #2   Title  Patient will improve FOTO score by at least 10% in order to demonstrate improved tolerance to activity.    Baseline  41%    Time  6    Period  Weeks    Status  On-going      PT LONG TERM GOAL #3   Title  Patient will report at least 75% improvement of symptoms for improved quality of life.    Time  6    Period  Weeks    Status  On-going      PT LONG TERM GOAL #4   Title  Patient will be able to walk for at least 30 minutes without AD in order to grocery shop.    Baseline  30 min with walker    Time  6    Period  Weeks    Status  On-going            Plan - 06/06/19 1008    Clinical Impression Statement  pt with increased soreness and antalgia this session due to holiday week and having appointments back to back. No new exercises added this session but continued to focus on established therex. Pt with improved tandem balance time with increased core engagement and focus on task. Encouraged to conitnue therex over holiday break.    Personal Factors and Comorbidities  Age;Comorbidity 3+;Past/Current Experience;Fitness    Comorbidities  hypertension, Hx CVA, Diabetes, Depression, obesity    Examination-Activity Limitations  Bed Mobility;Bend;Carry;Locomotion Level;Sit;Sleep;Squat;Stairs;Stand;Lift;Transfers    Examination-Participation Restrictions  Church;Cleaning;Driving;Laundry;Meal Prep;Volunteer;Yard Work    Stability/Clinical Decision Making  Stable/Uncomplicated    Rehab Potential  Good    PT Frequency  3x / week    PT Duration  6 weeks    PT Treatment/Interventions  ADLs/Self Care Home Management;Aquatic Therapy;Cryotherapy;Electrical Stimulation;Iontophoresis 4mg /ml Dexamethasone;Moist Heat;Ultrasound;Contrast Bath;DME Instruction;Gait training;Stair training;Functional mobility training;Therapeutic activities;Therapeutic exercise;Balance training;Neuromuscular re-education;Patient/family education;Manual techniques;Orthotic  Fit/Training;Manual lymph drainage;Compression bandaging;Scar mobilization;Passive range of motion;Dry needling;Energy conservation;Splinting;Taping;Vasopneumatic Device;Joint Manipulations    PT Next Visit Plan  Continue functional strengthening and balance.  Added vector stance and possible pallof next session.    PT Home Exercise Plan  05/16/19: R knee  flexion stretch in seated; 06/05/19: tandem stance and SLS with HHA PRN       Patient will benefit from skilled therapeutic intervention in order to improve the following deficits and impairments:  Abnormal gait, Decreased activity tolerance, Decreased balance, Decreased endurance, Decreased coordination, Decreased mobility, Decreased range of motion, Decreased strength, Difficulty walking, Increased edema, Increased fascial restricitons, Increased muscle spasms, Impaired flexibility, Obesity, Pain  Visit Diagnosis: Stiffness of right knee, not elsewhere classified  Right knee pain, unspecified chronicity  Muscle weakness (generalized)  Other abnormalities of gait and mobility     Problem List Patient Active Problem List   Diagnosis Date Noted  . UTI (urinary tract infection) 05/28/2019  . Nausea & vomiting 05/28/2019  . Constipation 05/28/2019  . S/P TKR (total knee replacement), right 03/23/2019 05/07/2019  . Primary osteoarthritis of right knee 04/02/2019  . Unilateral primary osteoarthritis, right knee   . Pedal edema 02/25/2019  . Acute vertigo with vomiting and inability to stand 10/08/2018  . Type 2 diabetes mellitus ([place]) 10/08/2018  . Carotid artery disease ([place]) 03/15/2018  . Cerebrovascular accident (CVA) ([place]) 11/16/2017  . Morbid obesity ([place]) 11/09/2017  . Low back pain 10/26/2017  . Screening mammogram, encounter for 09/18/2017  . CAD S/P percutaneous coronary angioplasty 11/04/2015  . NSTEMI (non-ST elevated myocardial infarction) ([place]) 11/03/2015  . Hypothyroidism 07/06/2010  . Hyperlipidemia LDL goal <100  08/28/2008  . Essential hypertension 11/13/2007  . 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 Diabetic peripheral neuropathy ([place]) 09/21/2006  . TOBACCO ABUSE 09/21/2006  . Adjustment disorder with mixed anxiety and depressed mood 09/21/2006  . CARPAL TUNNEL SYNDROME 09/21/2006  . COPD (chronic obstructive pulmonary disease) ([place]) 09/21/2006  . EDEMA 09/21/2006  . MIGRAINES, HX OF 09/21/2006   Teena Irani, PTA/CLT [phone]  Teena Irani 06/06/2019, 10:10 AM  [place], [place], [address] Phone: [phone]   Fax:  [phone]  Name: Jennifer [name] MRN: [id number] Date of Birth: May 03, 1956

## 2019-06-10 ENCOUNTER — Other Ambulatory Visit: Payer: Self-pay

## 2019-06-10 ENCOUNTER — Encounter (HOSPITAL_COMMUNITY): Payer: Self-pay | Admitting: Physical Therapy

## 2019-06-10 ENCOUNTER — Ambulatory Visit (HOSPITAL_COMMUNITY): Payer: 59 | Admitting: Physical Therapy

## 2019-06-10 DIAGNOSIS — M6281 Muscle weakness (generalized): Secondary | ICD-10-CM

## 2019-06-10 DIAGNOSIS — R2689 Other abnormalities of gait and mobility: Secondary | ICD-10-CM | POA: Diagnosis not present

## 2019-06-10 DIAGNOSIS — M25661 Stiffness of right knee, not elsewhere classified: Secondary | ICD-10-CM

## 2019-06-10 DIAGNOSIS — M25561 Pain in right knee: Secondary | ICD-10-CM

## 2019-06-10 NOTE — Therapy (Signed)
[place], [place], [address] Phone: [phone]   Fax:  [phone]  Physical Therapy Treatment  Patient Details  Name: Jennifer [name] MRN: [id number] Date of Birth: 1956/05/13 Referring Provider (PT): Arther Abbott MD   Encounter Date: 06/10/2019  PT End of Session - 06/10/19 1307    Visit Number  7    Number of Visits  18    Date for PT Re-Evaluation  06/27/19   Minireassess 06/06/19   Authorization Type  [employer]    Authorization Time Period  05/16/19-06/27/2019 (no auth req)    Authorization - Visit Number  7    Authorization - Number of Visits  10    PT Start Time  [id number]    PT Stop Time  1345    PT Time Calculation (min)  42 min    Equipment Utilized During Treatment  Gait belt   SBA/CGA during balance activities   Activity Tolerance  Patient tolerated treatment well    Behavior During Therapy  [facility] for tasks assessed/performed       Past Medical History:  Diagnosis Date  . CAD (coronary artery disease)    cath 5/23 100% dist RCA lesion treated with 2 overlapping Integrity Resolute DES ( 2.25x83mm, 2.25x50mm), 60% mid RCA treated medically, 99% OM2 not amenable to PCI, 70% D1 lesion, EF normal  . Hypercholesteremia   . Hypertension   . Hypothyroidism   . Stroke ([place]) 10/2018  . Thyroid disease   . Type 2 diabetes mellitus ([place]) 10/08/2018    Past Surgical History:  Procedure Laterality Date  . ABDOMINAL HYSTERECTOMY    . CARDIAC CATHETERIZATION N/A 11/03/2015   Procedure: Left Heart Cath and Coronary Angiography;  Surgeon: Leonie Man, MD;  Location: [place] [facility];  Service: Cardiovascular;  Laterality: N/A;  . CARDIAC CATHETERIZATION N/A 11/03/2015   Procedure: Coronary Stent Intervention;  Surgeon: Leonie Man, MD;  Location: [place] [facility];  Service: Cardiovascular;  Laterality: N/A;  . CARPAL TUNNEL RELEASE    . CORONARY ANGIOPLASTY     1 stent  . TOTAL KNEE  ARTHROPLASTY Right 04/02/2019   Procedure: TOTAL KNEE ARTHROPLASTY;  Surgeon: Carole Civil, MD;  Location: [facility];  Service: Orthopedics;  Laterality: Right;    There were no vitals filed for this visit.  Subjective Assessment - 06/10/19 1306    Subjective  Patient reports no new issues since last visit. Patient says knee is "alright".    Currently in Pain?  No/denies                       [facility] Adult PT Treatment/Exercise - 06/10/19 0001      Knee/Hip Exercises: Stretches   Knee: Self-Stretch to increase Flexion  Right;10 seconds    Knee: Self-Stretch Limitations  10 reps onto 12" box    Gastroc Stretch  3 reps;30 seconds    Gastroc Stretch Limitations  slant board      Knee/Hip Exercises: Aerobic   Recumbent Bike  4 min EOS for mobility       Knee/Hip Exercises: Standing   Heel Raises  Both;20 reps    Heel Raises Limitations  slope with toe raises    Forward Step Up  Right;Hand Hold: 1;Step Height: 6";1 set;15 reps    Step Down  Right;Hand Hold: 2;15 reps;Step Height: 6"    Stairs  2RT, 4 in, reciprocal, single hand rail; 2RT 7 in,  step tp pattern, single hand rail    SLS  3 x 10" each, intermittent HHA    Gait Training  226' no AD, cueing for heel to toe transition    Other Standing Knee Exercises  2RT sidestep    Other Standing Knee Exercises  tandem stance 3x 30"      Knee/Hip Exercises: Seated   Sit to Sand  2 sets;10 reps      Knee/Hip Exercises: Supine   Knee Extension  AROM    Knee Extension Limitations  0    Knee Flexion  AROM    Knee Flexion Limitations  106               PT Short Term Goals - 05/20/19 1528      PT SHORT TERM GOAL #1   Title  Patient will be independent with HEP in order to optimze functional outcomes.    Time  3    Period  Weeks    Status  On-going    Target Date  06/06/19        PT Long Term Goals - 05/20/19 1528      PT LONG TERM GOAL #1   Title  Patient will ambulate with gait speed of at least  0.8 m/s with least restrictive device in order to ambulate in the community.    Baseline  .56 m/s    Time  6    Period  Weeks    Status  On-going      PT LONG TERM GOAL #2   Title  Patient will improve FOTO score by at least 10% in order to demonstrate improved tolerance to activity.    Baseline  41%    Time  6    Period  Weeks    Status  On-going      PT LONG TERM GOAL #3   Title  Patient will report at least 75% improvement of symptoms for improved quality of life.    Time  6    Period  Weeks    Status  On-going      PT LONG TERM GOAL #4   Title  Patient will be able to walk for at least 30 minutes without AD in order to grocery shop.    Baseline  30 min with walker    Time  6    Period  Weeks    Status  On-going            Plan - 06/10/19 1342    Clinical Impression Statement  Patient had difficulty with 7 in stair ambulation due to RT quad weakness. Patient able to perform in step to pattern with use of single hand rail. Patient demos improved static balance with tandem stance and was able to progress to SLS with intermittent HHA. Patient cued on foot placement and decreased use of UEs for increased target muscle activation with step down form 6 in box.    Personal Factors and Comorbidities  Age;Comorbidity 3+;Past/Current Experience;Fitness    Comorbidities  hypertension, Hx CVA, Diabetes, Depression, obesity    Examination-Activity Limitations  Bed Mobility;Bend;Carry;Locomotion Level;Sit;Sleep;Squat;Stairs;Stand;Lift;Transfers    Examination-Participation Restrictions  Church;Cleaning;Driving;Laundry;Meal Prep;Volunteer;Yard Work    Stability/Clinical Decision Making  Stable/Uncomplicated    Rehab Potential  Good    PT Frequency  3x / week    PT Duration  6 weeks    PT Treatment/Interventions  ADLs/Self Care Home Management;Aquatic Therapy;Cryotherapy;Electrical Stimulation;Iontophoresis 4mg /ml Dexamethasone;Moist Heat;Ultrasound;Contrast Bath;DME Instruction;Gait  training;Stair training;Functional mobility training;Therapeutic activities;Therapeutic  exercise;Balance training;Neuromuscular re-education;Patient/family education;Manual techniques;Orthotic Fit/Training;Manual lymph drainage;Compression bandaging;Scar mobilization;Passive range of motion;Dry needling;Energy conservation;Splinting;Taping;Vasopneumatic Device;Joint Manipulations    PT Next Visit Plan  Continue to progress knee AROM and RLE strength as tolerated. Add band TKE and possibly band sidestepping next visit.    PT Home Exercise Plan  05/16/19: R knee flexion stretch in seated; 06/05/19: tandem stance and SLS with HHA PRN       Patient will benefit from skilled therapeutic intervention in order to improve the following deficits and impairments:  Abnormal gait, Decreased activity tolerance, Decreased balance, Decreased endurance, Decreased coordination, Decreased mobility, Decreased range of motion, Decreased strength, Difficulty walking, Increased edema, Increased fascial restricitons, Increased muscle spasms, Impaired flexibility, Obesity, Pain  Visit Diagnosis: Stiffness of right knee, not elsewhere classified  Right knee pain, unspecified chronicity  Muscle weakness (generalized)  Other abnormalities of gait and mobility     Problem List Patient Active Problem List   Diagnosis Date Noted  . UTI (urinary tract infection) 05/28/2019  . Nausea & vomiting 05/28/2019  . Constipation 05/28/2019  . S/P TKR (total knee replacement), right 03/23/2019 05/07/2019  . Primary osteoarthritis of right knee 04/02/2019  . Unilateral primary osteoarthritis, right knee   . Pedal edema 02/25/2019  . Acute vertigo with vomiting and inability to stand 10/08/2018  . Type 2 diabetes mellitus ([place]) 10/08/2018  . Carotid artery disease ([place]) 03/15/2018  . Cerebrovascular accident (CVA) ([place]) 11/16/2017  . Morbid obesity ([place]) 11/09/2017  . Low back pain 10/26/2017  . Screening mammogram,  encounter for 09/18/2017  . CAD S/P percutaneous coronary angioplasty 11/04/2015  . NSTEMI (non-ST elevated myocardial infarction) ([place]) 11/03/2015  . Hypothyroidism 07/06/2010  . Hyperlipidemia LDL goal <100 08/28/2008  . Essential hypertension 11/13/2007  . 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 Diabetic peripheral neuropathy ([place]) 09/21/2006  . TOBACCO ABUSE 09/21/2006  . Adjustment disorder with mixed anxiety and depressed mood 09/21/2006  . CARPAL TUNNEL SYNDROME 09/21/2006  . COPD (chronic obstructive pulmonary disease) ([place]) 09/21/2006  . EDEMA 09/21/2006  . MIGRAINES, HX OF 09/21/2006    1:46 PM, 06/10/19 Josue Hector PT DPT  Physical Therapist with [facility]  [phone] [place] [address] [place], [place], [address] Phone: [phone]   Fax:  [phone]  Name: Jennifer [name] MRN: [id number] Date of Birth: 03/17/1956

## 2019-06-11 ENCOUNTER — Ambulatory Visit (HOSPITAL_COMMUNITY): Payer: 59 | Admitting: Physical Therapy

## 2019-06-11 ENCOUNTER — Encounter (HOSPITAL_COMMUNITY): Payer: Self-pay | Admitting: Physical Therapy

## 2019-06-11 DIAGNOSIS — R2689 Other abnormalities of gait and mobility: Secondary | ICD-10-CM | POA: Diagnosis not present

## 2019-06-11 DIAGNOSIS — M25661 Stiffness of right knee, not elsewhere classified: Secondary | ICD-10-CM

## 2019-06-11 DIAGNOSIS — M6281 Muscle weakness (generalized): Secondary | ICD-10-CM

## 2019-06-11 DIAGNOSIS — M25561 Pain in right knee: Secondary | ICD-10-CM

## 2019-06-11 NOTE — Therapy (Signed)
[place], [place], [address] Phone: [phone]   Fax:  [phone]  Physical Therapy Treatment/ Progress Note  Patient Details  Name: Jennifer [name] MRN: [id number] Date of Birth: January 30, 1956 Referring Provider (PT): Arther Abbott MD   Encounter Date: 06/11/2019  Progress Note Reporting Period 05/16/19 to 06/11/19  See note below for Objective Data and Assessment of Progress/Goals.      PT End of Session - 06/11/19 1312    Visit Number  8    Number of Visits  18    Date for PT Re-Evaluation  06/27/19   PN complete 06/11/19   Authorization Type  [employer]    Authorization Time Period  05/16/19-06/27/2019 (no auth req)    Authorization - Visit Number  1    Authorization - Number of Visits  10    PT Start Time  6962    PT Stop Time  1350    PT Time Calculation (min)  45 min    Equipment Utilized During Treatment  Gait belt   SBA/CGA during balance activities   Activity Tolerance  Patient tolerated treatment well    Behavior During Therapy  WFL for tasks assessed/performed       Past Medical History:  Diagnosis Date  . CAD (coronary artery disease)    cath 5/23 100% dist RCA lesion treated with 2 overlapping Integrity Resolute DES ( 2.25x70m, 2.25x160m, 60% mid RCA treated medically, 99% OM2 not amenable to PCI, 70% D1 lesion, EF normal  . Hypercholesteremia   . Hypertension   . Hypothyroidism   . Stroke ([facility]/2020  . Thyroid disease   . Type 2 diabetes mellitus ([place]/27/2020    Past Surgical History:  Procedure Laterality Date  . ABDOMINAL HYSTERECTOMY    . CARDIAC CATHETERIZATION N/A 11/03/2015   Procedure: Left Heart Cath and Coronary Angiography;  Surgeon: DaLeonie ManMD;  [facility];  Service: Cardiovascular;  Laterality: N/A;  . CARDIAC CATHETERIZATION N/A 11/03/2015   Procedure: Coronary Stent Intervention;  Surgeon: DaLeonie ManMD;  [facility];  Service: Cardiovascular;  Laterality: N/A;  . CARPAL TUNNEL RELEASE    . CORONARY ANGIOPLASTY     1 stent  . TOTAL KNEE ARTHROPLASTY Right 04/02/2019   Procedure: TOTAL KNEE ARTHROPLASTY;  Surgeon: HaCarole CivilMD;  Location: [facility];  Service: Orthopedics;  Laterality: Right;    There were no vitals filed for this visit.  Subjective Assessment - 06/11/19 1309    Subjective  Patient says she feels about 45-50% improved since starting therapy. Patient states she is still having trouble standing on just one leg, and with ambulating stairs. Patient notes that pain has improved, and does not have much at rest, but does have pain with acitivty    How long can you stand comfortably?  30-40 minutes    How long can you walk comfortably?  around 1 hour at store using shopping cart    Patient Stated Goals  walk without walker or cane    Currently in Pain?  No/denies         [facility] - 06/11[facility]19 0001      Assessment   Medical Diagnosis  R TKA    Referring Provider (PT)  StArther AbbottD    Onset Date/Surgical Date  03/23/19    Prior Therapy  yes, Home health      Precautions   Precautions  None      Restrictions   Weight Bearing Restrictions  No      Balance Screen   Has the patient fallen in the past 6 months  No      [place] residence      Prior Function   Level of Independence  Independent      Cognition   Overall Cognitive Status  Within Functional Limits for tasks assessed      Observation/Other Assessments   Observations  Incision appears well healed and intact    Focus on Therapeutic Outcomes (FOTO)   28% limited   was 41%     AROM   Right Knee Extension  1   was 2   Right Knee Flexion  110   was 100     Strength   Right Hip Flexion  4/5   was 3+   Right Hip ABduction  4+/5   was 4   Left Hip Flexion  4/5   was 4-   Left Hip ABduction  4+/5   was 4   Right Knee Flexion  4+/5     Right Knee Extension  5/5   was 4+   Left Knee Flexion  5/5    Left Knee Extension  5/5    Right Ankle Dorsiflexion  5/5    Left Ankle Dorsiflexion  5/5      Ambulation/Gait   Ambulation/Gait  Yes    Ambulation/Gait Assistance  6: Modified independent (Device/Increase time)    Ambulation Distance (Feet)  305 Feet    Assistive device  None    Gait Pattern  Decreased stride length;Decreased stance time - left;Decreased dorsiflexion - left;Trendelenburg    Ambulation Surface  Level;Indoor    Gait velocity  0.775 m/s    Gait Comments  2MWT                   [facility] Adult PT Treatment/Exercise - 06/11/19 0001      Knee/Hip Exercises: Stretches   Knee: Self-Stretch to increase Flexion  Right;10 seconds    Knee: Self-Stretch Limitations  5 reps onto 12" box    Gastroc Stretch  3 reps;30 seconds    Gastroc Stretch Limitations  slant board      Knee/Hip Exercises: Standing   Forward Step Up  Right;Hand Hold: 1;Step Height: 6";1 set;15 reps    Step Down  Right;Hand Hold: 2;15 reps;Step Height: 6"    Other Standing Knee Exercises  tandem stance 3x 30" on foam              PT Education - 06/11/19 1312    Education Details  Patient educated on reassessment findings, and POC    Person(s) Educated  Patient    Methods  Explanation    Comprehension  Verbalized understanding       PT Short Term Goals - 06/11/19 1335      PT SHORT TERM GOAL #1   Title  Patient will be independent with HEP in order to optimze functional outcomes.    Time  3    Period  Weeks    Status  Achieved    Target Date  06/06/19        PT Long Term Goals - 06/11/19 1335      PT LONG TERM GOAL #1   Title  Patient will ambulate with gait speed of at least 0.8 m/s with least restrictive device in order to ambulate in the  community.    Baseline  0.78 m/s    Time  6    Period  Weeks    Status  On-going      PT LONG TERM GOAL #2   Title  Patient will improve FOTO score by at least 10% in  order to demonstrate improved tolerance to activity.    Baseline  28%    Time  6    Period  Weeks    Status  Achieved      PT LONG TERM GOAL #3   Title  Patient will report at least 75% improvement of symptoms for improved quality of life.    Time  6    Period  Weeks    Status  On-going      PT LONG TERM GOAL #4   Title  Patient will be able to walk for at least 30 minutes without AD in order to grocery shop.    Baseline  able to do 60 minutes, but with use shopping cart    Time  6    Period  Weeks    Status  Partially Met            Plan - 06/11/19 1351    Clinical Impression Statement  Patient is making very good progress to LTGs. Patient has good RT knee AROM, just mild limitation in flexion AROM, and good improvement in overall MMT, but with mild hip weakness. Patient doing well with tolerance to functional activity, and is just shy of meeting gait speed goal. Patient still slightly limited with stair ambulation due to RT quad weakness, especially with eccentric lowering. Patient also slightly limited with maintaining static balance in tandem stance, but shows improvement.  Patient will continue to benefit from skilled therapy to address remaining deficits to reduce pain and improve level of function with functional mobility and ADLs.    Personal Factors and Comorbidities  Age;Comorbidity 3+;Past/Current Experience;Fitness    Comorbidities  hypertension, Hx CVA, Diabetes, Depression, obesity    Examination-Activity Limitations  Bed Mobility;Bend;Carry;Locomotion Level;Sit;Sleep;Squat;Stairs;Stand;Lift;Transfers    Examination-Participation Restrictions  Church;Cleaning;Driving;Laundry;Meal Prep;Volunteer;Yard Work    Stability/Clinical Decision Making  Stable/Uncomplicated    Rehab Potential  Good    PT Frequency  3x / week    PT Duration  2 weeks    PT Treatment/Interventions  ADLs/Self Care Home Management;Aquatic Therapy;Cryotherapy;Electrical Stimulation;Iontophoresis  35m/ml Dexamethasone;Moist Heat;Ultrasound;Contrast Bath;DME Instruction;Gait training;Stair training;Functional mobility training;Therapeutic activities;Therapeutic exercise;Balance training;Neuromuscular re-education;Patient/family education;Manual techniques;Orthotic Fit/Training;Manual lymph drainage;Compression bandaging;Scar mobilization;Passive range of motion;Dry needling;Energy conservation;Splinting;Taping;Vasopneumatic Device;Joint Manipulations    PT Next Visit Plan  Continue to progress knee AROM and RLE strength as tolerated. Add band sidestepping next visit.    PT Home Exercise Plan  05/16/19: R knee flexion stretch in seated; 06/05/19: tandem stance and SLS with HHA PRN    Consulted and Agree with Plan of Care  Patient       Patient will benefit from skilled therapeutic intervention in order to improve the following deficits and impairments:  Abnormal gait, Decreased activity tolerance, Decreased balance, Decreased endurance, Decreased coordination, Decreased mobility, Decreased range of motion, Decreased strength, Difficulty walking, Increased edema, Increased fascial restricitons, Increased muscle spasms, Impaired flexibility, Obesity, Pain  Visit Diagnosis: Stiffness of right knee, not elsewhere classified  Right knee pain, unspecified chronicity  Muscle weakness (generalized)  Other abnormalities of gait and mobility     Problem List Patient Active Problem List   Diagnosis Date Noted  . UTI (urinary tract infection) 05/28/2019  . Nausea & vomiting  05/28/2019  . Constipation 05/28/2019  . S/P TKR (total knee replacement), right 03/23/2019 05/07/2019  . Primary osteoarthritis of right knee 04/02/2019  . Unilateral primary osteoarthritis, right knee   . Pedal edema 02/25/2019  . Acute vertigo with vomiting and inability to stand 10/08/2018  . Type 2 diabetes mellitus ([place]) 10/08/2018  . Carotid artery disease ([place]) 03/15/2018  . Cerebrovascular accident (CVA) ([place])  11/16/2017  . Morbid obesity ([place]) 11/09/2017  . Low back pain 10/26/2017  . Screening mammogram, encounter for 09/18/2017  . CAD S/P percutaneous coronary angioplasty 11/04/2015  . NSTEMI (non-ST elevated myocardial infarction) ([place]) 11/03/2015  . Hypothyroidism 07/06/2010  . Hyperlipidemia LDL goal <100 08/28/2008  . Essential hypertension 11/13/2007  . 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 Diabetic peripheral neuropathy ([place]) 09/21/2006  . TOBACCO ABUSE 09/21/2006  . Adjustment disorder with mixed anxiety and depressed mood 09/21/2006  . CARPAL TUNNEL SYNDROME 09/21/2006  . COPD (chronic obstructive pulmonary disease) ([place]) 09/21/2006  . EDEMA 09/21/2006  . MIGRAINES, HX OF 09/21/2006   2:00 PM, 06/11/19 Josue Hector PT DPT  Physical Therapist with [facility]  [phone] [place] [address] [place], [place], [address] Phone: [phone]   Fax:  [phone]  Name: Jennifer [name] MRN: [id number] Date of Birth: 12/06/1955

## 2019-06-12 ENCOUNTER — Ambulatory Visit (INDEPENDENT_AMBULATORY_CARE_PROVIDER_SITE_OTHER): Payer: 59 | Admitting: Orthopedic Surgery

## 2019-06-12 ENCOUNTER — Other Ambulatory Visit: Payer: Self-pay

## 2019-06-12 ENCOUNTER — Encounter: Payer: Self-pay | Admitting: Orthopedic Surgery

## 2019-06-12 VITALS — BP 148/77 | HR 81 | Temp 97.1°F | Ht 67.0 in | Wt 256.0 lb

## 2019-06-12 DIAGNOSIS — Z96651 Presence of right artificial knee joint: Secondary | ICD-10-CM

## 2019-06-12 NOTE — Patient Instructions (Signed)
OOW UNTIL FEB 1

## 2019-06-12 NOTE — Progress Notes (Signed)
Postoperative visit  The patient is status post right total knee on October 10, 20 20  She is doing well I think she can probably go back to work February 1  Her knee extension is good her flexion is 110 she is not using any assistive devices she has good quadricep strength  I will see her in October for annual x-ray

## 2019-06-13 ENCOUNTER — Encounter (HOSPITAL_COMMUNITY): Payer: Self-pay | Admitting: Physical Therapy

## 2019-06-13 ENCOUNTER — Ambulatory Visit (HOSPITAL_COMMUNITY): Payer: 59 | Admitting: Physical Therapy

## 2019-06-13 DIAGNOSIS — R2689 Other abnormalities of gait and mobility: Secondary | ICD-10-CM

## 2019-06-13 DIAGNOSIS — M25561 Pain in right knee: Secondary | ICD-10-CM

## 2019-06-13 DIAGNOSIS — M25661 Stiffness of right knee, not elsewhere classified: Secondary | ICD-10-CM

## 2019-06-13 DIAGNOSIS — M6281 Muscle weakness (generalized): Secondary | ICD-10-CM

## 2019-06-13 NOTE — Therapy (Signed)
[place], [place], [address] Phone: [phone]   Fax:  [phone]  Physical Therapy Treatment  Patient Details  Name: Jennifer [name] MRN: [id number] Date of Birth: 12-Nov-1955 Referring Provider (PT): Arther Abbott MD   Encounter Date: 06/13/2019  PT End of Session - 06/13/19 1353    Visit Number  9    Number of Visits  18    Date for PT Re-Evaluation  06/27/19   PN complete 06/11/19   Authorization Type  [employer]    Authorization Time Period  05/16/19-06/27/2019 (no auth req)    Authorization - Visit Number  2    Authorization - Number of Visits  10    PT Start Time  1300    PT Stop Time  1343    PT Time Calculation (min)  43 min    Equipment Utilized During Treatment  Gait belt   SBA/CGA during balance activities   Activity Tolerance  Patient tolerated treatment well    Behavior During Therapy  WFL for tasks assessed/performed       Past Medical History:  Diagnosis Date  . CAD (coronary artery disease)    cath 5/23 100% dist RCA lesion treated with 2 overlapping Integrity Resolute DES ( 2.25x35m, 2.25x161m, 60% mid RCA treated medically, 99% OM2 not amenable to PCI, 70% D1 lesion, EF normal  . Hypercholesteremia   . Hypertension   . Hypothyroidism   . Stroke ([facility]  . Thyroid disease   . Type 2 diabetes mellitus ([facility]4/27/2020    Past Surgical History:  Procedure Laterality Date  . ABDOMINAL HYSTERECTOMY    . CARDIAC CATHETERIZATION N/A 11/03/2015   Procedure: Left Heart Cath and Coronary Angiography;  Surgeon: DaLeonie ManMD;  [facility];  Service: Cardiovascular;  Laterality: N/A;  . CARDIAC CATHETERIZATION N/A 11/03/2015   Procedure: Coronary Stent Intervention;  Surgeon: DaLeonie ManMD;  [facility];  Service: Cardiovascular;  Laterality: N/A;  . CARPAL TUNNEL RELEASE    . CORONARY ANGIOPLASTY     1 stent  . TOTAL KNEE  ARTHROPLASTY Right 04/02/2019   Procedure: TOTAL KNEE ARTHROPLASTY;  Surgeon: HaCarole CivilMD;  Location: [facility];  Service: Orthopedics;  Laterality: Right;    There were no vitals filed for this visit.  Subjective Assessment - 06/13/19 1258    Subjective  Patient states she thinks she slept on her knee wrong last night because its sore. Her other knee is sore because she is compensating for her surgical leg. She does not always complete her HEP because the exercises are painful and she has difficulty with the balance ones.    How long can you stand comfortably?  30-40 minutes    How long can you walk comfortably?  around 1 hour at store using shopping cart    Patient Stated Goals  walk without walker or cane    Currently in Pain?  Yes    Pain Score  4                        [facility] Adult PT Treatment/Exercise - 06/13/19 0001      Knee/Hip Exercises: Stretches   Gastroc Stretch  3 reps;30 seconds    Gastroc Stretch Limitations  slant board      Knee/Hip Exercises: Aerobic   Recumbent Bike  5 minutes for mobility      Knee/Hip  Exercises: Standing   Lateral Step Up  10 reps;Step Height: 4";Hand Hold: 1;2 sets;Both    Lateral Step Up Limitations  focus on eccentric control    Forward Step Up  Right;Hand Hold: 1;Step Height: 6";1 set;15 reps    Step Down  Right;Hand Hold: 2;15 reps;Step Height: 6"    Stairs  2RT, 4 in, reciprocal, single hand rail; 2RT 7 in, step tp pattern, single hand rail    Gait Training  226' no AD, cueing for heel to toe transition    Other Standing Knee Exercises  TKE with purple band    Other Standing Knee Exercises  lateral stepping with band at knees 4x15 feet             PT Education - 06/13/19 1351    Education Details  Review of HEP. Patient educated on completing HEP, mechanics, ways to complete at home.    Person(s) Educated  Patient    Methods  Explanation;Handout    Comprehension  Verbalized understanding       PT  Short Term Goals - 06/11/19 1335      PT SHORT TERM GOAL #1   Title  Patient will be independent with HEP in order to optimze functional outcomes.    Time  3    Period  Weeks    Status  Achieved    Target Date  06/06/19        PT Long Term Goals - 06/11/19 1335      PT LONG TERM GOAL #1   Title  Patient will ambulate with gait speed of at least 0.8 m/s with least restrictive device in order to ambulate in the community.    Baseline  0.78 m/s    Time  6    Period  Weeks    Status  On-going      PT LONG TERM GOAL #2   Title  Patient will improve FOTO score by at least 10% in order to demonstrate improved tolerance to activity.    Baseline  28%    Time  6    Period  Weeks    Status  Achieved      PT LONG TERM GOAL #3   Title  Patient will report at least 75% improvement of symptoms for improved quality of life.    Time  6    Period  Weeks    Status  On-going      PT LONG TERM GOAL #4   Title  Patient will be able to walk for at least 30 minutes without AD in order to grocery shop.    Baseline  able to do 60 minutes, but with use shopping cart    Time  6    Period  Weeks    Status  Partially Met            Plan - 06/13/19 1353    Clinical Impression Statement  Patient has difficulty navigating increased height stairs reciprocally secondary to impaired eccentric quad and hip strength. She also requires more UE assistance and compensatory strategies for step up on 6 inch height vs. 4 inch height. She is able to perform lateral step down with verbal cueing and prior demonstration to touch contralateral heel to ground before toes to avoid compensation for decreased quad strength. She ambulates with bilateral Trendelenburg gait must likely due to impaired bilateral hip abductor strength. Patient overall tolerates session well and experiences fatigue following. Patient will continue to benefit from skilled physical  therapy in order to improve function.    Personal Factors and  Comorbidities  Age;Comorbidity 3+;Past/Current Experience;Fitness    Comorbidities  hypertension, Hx CVA, Diabetes, Depression, obesity    Examination-Activity Limitations  Bed Mobility;Bend;Carry;Locomotion Level;Sit;Sleep;Squat;Stairs;Stand;Lift;Transfers    Examination-Participation Restrictions  Church;Cleaning;Driving;Laundry;Meal Prep;Volunteer;Yard Work    Stability/Clinical Decision Making  Stable/Uncomplicated    Rehab Potential  Good    PT Frequency  3x / week    PT Duration  2 weeks    PT Treatment/Interventions  ADLs/Self Care Home Management;Aquatic Therapy;Cryotherapy;Electrical Stimulation;Iontophoresis 45m/ml Dexamethasone;Moist Heat;Ultrasound;Contrast Bath;DME Instruction;Gait training;Stair training;Functional mobility training;Therapeutic activities;Therapeutic exercise;Balance training;Neuromuscular re-education;Patient/family education;Manual techniques;Orthotic Fit/Training;Manual lymph drainage;Compression bandaging;Scar mobilization;Passive range of motion;Dry needling;Energy conservation;Splinting;Taping;Vasopneumatic Device;Joint Manipulations    PT Next Visit Plan  Continue to progress knee AROM and RLE strength as tolerated. Continue to progress stairs as tolerated to increased height.    PT Home Exercise Plan  05/16/19: R knee flexion stretch in seated; 06/05/19: tandem stance and SLS with HHA PRN    Consulted and Agree with Plan of Care  Patient       Patient will benefit from skilled therapeutic intervention in order to improve the following deficits and impairments:  Abnormal gait, Decreased activity tolerance, Decreased balance, Decreased endurance, Decreased coordination, Decreased mobility, Decreased range of motion, Decreased strength, Difficulty walking, Increased edema, Increased fascial restricitons, Increased muscle spasms, Impaired flexibility, Obesity, Pain  Visit Diagnosis: Stiffness of right knee, not elsewhere classified  Right knee pain, unspecified  chronicity  Muscle weakness (generalized)  Other abnormalities of gait and mobility     Problem List Patient Active Problem List   Diagnosis Date Noted  . UTI (urinary tract infection) 05/28/2019  . Nausea & vomiting 05/28/2019  . Constipation 05/28/2019  . S/P TKR (total knee replacement), right 03/23/2019 05/07/2019  . Primary osteoarthritis of right knee 04/02/2019  . Unilateral primary osteoarthritis, right knee   . Pedal edema 02/25/2019  . Acute vertigo with vomiting and inability to stand 10/08/2018  . Type 2 diabetes mellitus ([facility] 10/08/2018  . Carotid artery disease ([facility] 03/15/2018  . Cerebrovascular accident (CVA) ([facility] 11/16/2017  . Morbid obesity ([facility] 11/09/2017  . Low back pain 10/26/2017  . Screening mammogram, encounter for 09/18/2017  . CAD S/P percutaneous coronary angioplasty 11/04/2015  . NSTEMI (non-ST elevated myocardial infarction) ([facility] 11/03/2015  . Hypothyroidism 07/06/2010  . Hyperlipidemia LDL goal <100 08/28/2008  . Essential hypertension 11/13/2007  . 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 Diabetic peripheral neuropathy ([facility] 09/21/2006  . TOBACCO ABUSE 09/21/2006  . Adjustment disorder with mixed anxiety and depressed mood 09/21/2006  . CARPAL TUNNEL SYNDROME 09/21/2006  . COPD (chronic obstructive pulmonary disease) ([facility] 09/21/2006  . EDEMA 09/21/2006  . MIGRAINES, HX OF 09/21/2006   2:03 PM, 06/13/19 AMearl LatinPT, DPT Physical Therapist at [place] [address]Phone: [phone]  Fax:  [phone] Name: Jennifer MODESITTMRN: [id number] of Birth: [birth year]

## 2019-06-17 ENCOUNTER — Ambulatory Visit (HOSPITAL_COMMUNITY): Payer: 59 | Admitting: Physical Therapy

## 2019-06-19 ENCOUNTER — Encounter (HOSPITAL_COMMUNITY): Payer: Self-pay | Admitting: Physical Therapy

## 2019-06-19 ENCOUNTER — Ambulatory Visit (HOSPITAL_COMMUNITY): Payer: 59 | Attending: Orthopedic Surgery | Admitting: Physical Therapy

## 2019-06-19 ENCOUNTER — Other Ambulatory Visit: Payer: Self-pay

## 2019-06-19 DIAGNOSIS — M25661 Stiffness of right knee, not elsewhere classified: Secondary | ICD-10-CM | POA: Diagnosis present

## 2019-06-19 DIAGNOSIS — R2689 Other abnormalities of gait and mobility: Secondary | ICD-10-CM

## 2019-06-19 DIAGNOSIS — M25561 Pain in right knee: Secondary | ICD-10-CM | POA: Insufficient documentation

## 2019-06-19 DIAGNOSIS — M6281 Muscle weakness (generalized): Secondary | ICD-10-CM | POA: Insufficient documentation

## 2019-06-19 NOTE — Therapy (Signed)
[place], [place], [address] Phone: [phone]   Fax:  [phone]  Physical Therapy Treatment  Patient Details  Name: Jennifer [name] MRN: [id number] Date of Birth: 1956/06/03 Referring Provider (PT): Arther Abbott MD   Encounter Date: 06/19/2019  PT End of Session - 06/19/19 1331    Visit Number  10    Number of Visits  18    Date for PT Re-Evaluation  06/27/19   PN complete 06/11/19   Authorization Type  [employer]    Authorization Time Period  05/16/19-06/27/2019 (no auth req)    Authorization - Visit Number  3    Authorization - Number of Visits  10    PT Start Time  1302    PT Stop Time  1341    PT Time Calculation (min)  39 min    Equipment Utilized During Treatment  --   SBA/CGA during balance activities   Activity Tolerance  Patient tolerated treatment well    Behavior During Therapy  [facility] for tasks assessed/performed       Past Medical History:  Diagnosis Date  . CAD (coronary artery disease)    cath 5/23 100% dist RCA lesion treated with 2 overlapping Integrity Resolute DES ( 2.25x60m, 2.25x122m, 60% mid RCA treated medically, 99% OM2 not amenable to PCI, 70% D1 lesion, EF normal  . Hypercholesteremia   . Hypertension   . Hypothyroidism   . Stroke ([facility]/2020  . Thyroid disease   . Type 2 diabetes mellitus (HCK[facility]4/27/2020    Past Surgical History:  Procedure Laterality Date  . ABDOMINAL HYSTERECTOMY    . CARDIAC CATHETERIZATION N/A 11/03/2015   Procedure: Left Heart Cath and Coronary Angiography;  Surgeon: DaLeonie ManMD;  [facility];  Service: Cardiovascular;  Laterality: N/A;  . CARDIAC CATHETERIZATION N/A 11/03/2015   Procedure: Coronary Stent Intervention;  Surgeon: DaLeonie ManMD;  [facility];  Service: Cardiovascular;  Laterality: N/A;  . CARPAL TUNNEL RELEASE    . CORONARY ANGIOPLASTY     1 stent  . TOTAL KNEE ARTHROPLASTY  Right 04/02/2019   Procedure: TOTAL KNEE ARTHROPLASTY;  Surgeon: HaCarole CivilMD;  Location: [facility];  Service: Orthopedics;  Laterality: Right;    There were no vitals filed for this visit.  Subjective Assessment - 06/19/19 1302    Subjective  Patient states her knee is still painful on the inside part. She does her exercises until her knee starts to get sore. Stairs are still difficult for her. She was able to put her own shoes on today.    How long can you stand comfortably?  30-40 minutes    How long can you walk comfortably?  around 1 hour at store using shopping cart    Patient Stated Goals  walk without walker or cane    Currently in Pain?  Yes    Pain Score  4     Pain Location  Knee                       [facility] Adult PT Treatment/Exercise - 06/19/19 0001      Knee/Hip Exercises: Stretches   Gastroc Stretch  3 reps;30 seconds    Gastroc Stretch Limitations  slant board      Knee/Hip Exercises: Aerobic   Recumbent Bike  5 minutes for mobility      Knee/Hip Exercises: Standing  Lateral Step Up  10 reps;Step Height: 4";Hand Hold: 1;Both;3 sets    Lateral Step Up Limitations  focus on eccentric control    Forward Step Up  Right;Hand Hold: 1;Step Height: 6";10 reps;2 sets    Step Down  Right;Hand Hold: 2;15 reps;Step Height: 6"    Stairs  2RT, 4 in, reciprocal, single hand rail; 2RT 7 in, step tp pattern, single hand rail    Gait Training  226' no AD, cueing for heel to toe transition    Other Standing Knee Exercises  TKE with purple band 2 x 10    Other Standing Knee Exercises  lateral stepping with band at knees 4x15 feet      Knee/Hip Exercises: Seated   Sit to Sand  2 sets;10 reps      Knee/Hip Exercises: Supine   Knee Extension  AROM    Knee Extension Limitations  0    Knee Flexion  AROM    Knee Flexion Limitations  107             PT Education - 06/19/19 1308    Education Details  Review of HEP. Patient educated on completing HEP,  mechanics, ways to complete at home.    Person(s) Educated  Patient    Methods  Explanation;Handout    Comprehension  Verbalized understanding       PT Short Term Goals - 06/11/19 1335      PT SHORT TERM GOAL #1   Title  Patient will be independent with HEP in order to optimze functional outcomes.    Time  3    Period  Weeks    Status  Achieved    Target Date  06/06/19        PT Long Term Goals - 06/11/19 1335      PT LONG TERM GOAL #1   Title  Patient will ambulate with gait speed of at least 0.8 m/s with least restrictive device in order to ambulate in the community.    Baseline  0.78 m/s    Time  6    Period  Weeks    Status  On-going      PT LONG TERM GOAL #2   Title  Patient will improve FOTO score by at least 10% in order to demonstrate improved tolerance to activity.    Baseline  28%    Time  6    Period  Weeks    Status  Achieved      PT LONG TERM GOAL #3   Title  Patient will report at least 75% improvement of symptoms for improved quality of life.    Time  6    Period  Weeks    Status  On-going      PT LONG TERM GOAL #4   Title  Patient will be able to walk for at least 30 minutes without AD in order to grocery shop.    Baseline  able to do 60 minutes, but with use shopping cart    Time  6    Period  Weeks    Status  Partially Met            Plan - 06/19/19 1336    Clinical Impression Statement  Patient continues to have difficulty with stairs and demonstrates impaired eccentric quad strength. She continues to have pain and increased difficulty with 6 inch step compared to 4 inch. She requires frequent verbal cueing to have heel hit ground before toes when completing lateral  step down. She is overall progressing well. Patient will continue to benefit from skilled physical therapy in order to reduce impairment and improve function.    Personal Factors and Comorbidities  Age;Comorbidity 3+;Past/Current Experience;Fitness    Comorbidities   hypertension, Hx CVA, Diabetes, Depression, obesity    Examination-Activity Limitations  Bed Mobility;Bend;Carry;Locomotion Level;Sit;Sleep;Squat;Stairs;Stand;Lift;Transfers    Examination-Participation Restrictions  Church;Cleaning;Driving;Laundry;Meal Prep;Volunteer;Yard Work    Stability/Clinical Decision Making  Stable/Uncomplicated    Rehab Potential  Good    PT Frequency  3x / week    PT Duration  2 weeks    PT Treatment/Interventions  ADLs/Self Care Home Management;Aquatic Therapy;Cryotherapy;Electrical Stimulation;Iontophoresis 64m/ml Dexamethasone;Moist Heat;Ultrasound;Contrast Bath;DME Instruction;Gait training;Stair training;Functional mobility training;Therapeutic activities;Therapeutic exercise;Balance training;Neuromuscular re-education;Patient/family education;Manual techniques;Orthotic Fit/Training;Manual lymph drainage;Compression bandaging;Scar mobilization;Passive range of motion;Dry needling;Energy conservation;Splinting;Taping;Vasopneumatic Device;Joint Manipulations    PT Next Visit Plan  Continue to progress knee AROM and RLE strength as tolerated. Continue to progress stairs as tolerated to increased height.    PT Home Exercise Plan  05/16/19: R knee flexion stretch in seated; 06/05/19: tandem stance and SLS with HHA PRN    Consulted and Agree with Plan of Care  Patient       Patient will benefit from skilled therapeutic intervention in order to improve the following deficits and impairments:  Abnormal gait, Decreased activity tolerance, Decreased balance, Decreased endurance, Decreased coordination, Decreased mobility, Decreased range of motion, Decreased strength, Difficulty walking, Increased edema, Increased fascial restricitons, Increased muscle spasms, Impaired flexibility, Obesity, Pain  Visit Diagnosis: Stiffness of right knee, not elsewhere classified  Right knee pain, unspecified chronicity  Muscle weakness (generalized)  Other abnormalities of gait and  mobility     Problem List Patient Active Problem List   Diagnosis Date Noted  . UTI (urinary tract infection) 05/28/2019  . Nausea & vomiting 05/28/2019  . Constipation 05/28/2019  . S/P TKR (total knee replacement), right 03/23/2019 05/07/2019  . Primary osteoarthritis of right knee 04/02/2019  . Unilateral primary osteoarthritis, right knee   . Pedal edema 02/25/2019  . Acute vertigo with vomiting and inability to stand 10/08/2018  . Type 2 diabetes mellitus ([place] [place] 10/08/2018  . Carotid artery disease (HK[place] 03/15/2018  . Cerebrovascular accident (CVA) ([facility] 11/16/2017  . Morbid obesity (HW[facility] [place] 11/09/2017  . Low back pain 10/26/2017  . Screening mammogram, encounter for 09/18/2017  . CAD S/P percutaneous coronary angioplasty 11/04/2015  . NSTEMI (non-ST elevated myocardial infarction) (HN[facility] 11/03/2015  . Hypothyroidism 07/06/2010  . Hyperlipidemia LDL goal <100 08/28/2008  . Essential hypertension 11/13/2007  . 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 Diabetic peripheral neuropathy (HO[place] 09/21/2006  . TOBACCO ABUSE 09/21/2006  . Adjustment disorder with mixed anxiety and depressed mood 09/21/2006  . CARPAL TUNNEL SYNDROME 09/21/2006  . COPD (chronic obstructive pulmonary disease) ([address] 09/21/2006  . EDEMA 09/21/2006  . MIGRAINES, HX OF 09/21/2006    1:51 PM, 06/19/19 AMearl LatinPT, DPT Physical Therapist at [place] [address]Phone: [phone]  Fax:  [phone] Name: MJAIRA CANADYMRN: [id number] of Birth: 1Mar 01, 1957

## 2019-06-21 ENCOUNTER — Ambulatory Visit (HOSPITAL_COMMUNITY): Payer: 59 | Admitting: Physical Therapy

## 2019-06-21 ENCOUNTER — Other Ambulatory Visit: Payer: Self-pay

## 2019-06-21 DIAGNOSIS — M25561 Pain in right knee: Secondary | ICD-10-CM

## 2019-06-21 DIAGNOSIS — R2689 Other abnormalities of gait and mobility: Secondary | ICD-10-CM

## 2019-06-21 DIAGNOSIS — M25661 Stiffness of right knee, not elsewhere classified: Secondary | ICD-10-CM

## 2019-06-21 DIAGNOSIS — M6281 Muscle weakness (generalized): Secondary | ICD-10-CM

## 2019-06-21 NOTE — Therapy (Signed)
[place], [place], [address] Phone: [phone]   Fax:  [phone]  Physical Therapy Treatment  Patient Details  Name: Jennifer [name] MRN: [id number] Date of Birth: 08-22-1955 Referring Provider (PT): Arther Abbott MD   Encounter Date: 06/21/2019  PT End of Session - 06/21/19 1147    Visit Number  11    Number of Visits  18    Date for PT Re-Evaluation  06/27/19   PN complete 06/11/19   Authorization Type  [employer]    Authorization Time Period  05/16/19-06/27/2019 (no auth req)    Authorization - Visit Number  4    Authorization - Number of Visits  10    PT Start Time  1048    PT Stop Time  1130    PT Time Calculation (min)  42 min    Equipment Utilized During Treatment  --   SBA/CGA during balance activities   Activity Tolerance  Patient tolerated treatment well    Behavior During Therapy  [facility] for tasks assessed/performed       Past Medical History:  Diagnosis Date  . CAD (coronary artery disease)    cath 5/23 100% dist RCA lesion treated with 2 overlapping Integrity Resolute DES ( 2.25x54m, 2.25x143m, 60% mid RCA treated medically, 99% OM2 not amenable to PCI, 70% D1 lesion, EF normal  . Hypercholesteremia   . Hypertension   . Hypothyroidism   . Stroke ([place]/2020  . Thyroid disease   . Type 2 diabetes mellitus (HCS[place]4/27/2020    Past Surgical History:  Procedure Laterality Date  . ABDOMINAL HYSTERECTOMY    . CARDIAC CATHETERIZATION N/A 11/03/2015   Procedure: Left Heart Cath and Coronary Angiography;  Surgeon: DaLeonie ManMD;  [facility];  Service: Cardiovascular;  Laterality: N/A;  . CARDIAC CATHETERIZATION N/A 11/03/2015   Procedure: Coronary Stent Intervention;  Surgeon: DaLeonie ManMD;  [facility];  Service: Cardiovascular;  Laterality: N/A;  . CARPAL TUNNEL RELEASE    . CORONARY ANGIOPLASTY     1 stent  . TOTAL KNEE ARTHROPLASTY  Right 04/02/2019   Procedure: TOTAL KNEE ARTHROPLASTY;  Surgeon: HaCarole CivilMD;  Location: [facility];  Service: Orthopedics;  Laterality: Right;    There were no vitals filed for this visit.  Subjective Assessment - 06/21/19 1110    Subjective  pt states she has no pain today.  States she's always had a limp since her stroke.  States stairs are the hardest for her.    Currently in Pain?  No/denies                       [facility] - 06/21/19 0001      Knee/Hip Exercises: Stretches   Active Hamstring Stretch  Right;Left;3 reps;30 seconds    Active Hamstring Stretch Limitations  from 12" step    Gastroc Stretch  3 reps;30 seconds    Gastroc Stretch Limitations  slant board      Knee/Hip Exercises: Aerobic   Recumbent Bike  5 minutes for mobility      Knee/Hip Exercises: Standing   Lateral Step Up  10 reps;Step Height: 4";Hand Hold: 1;Both;2 sets    Lateral Step Up Limitations  focus on eccentric control    Forward Step Up  Right;Hand Hold: 1;Step Height: 6";10 reps;2 sets    Step Down  Right;Hand Hold: 2;15 reps;Step Height: 4"  Step Down Limitations  eccentric control with 1 HHA Rt harder than left    Stairs  2RT, all way across 6" and 4" side reciprocal, single hand rail; 2RT 7 in, step tp pattern, single hand rail    Gait Training  226' no AD, cueing for heel to toe transition      Knee/Hip Exercises: Seated   Sit to Sand  2 sets;10 reps               PT Short Term Goals - 06/11/19 1335      PT SHORT TERM GOAL #1   Title  Patient will be independent with HEP in order to optimze functional outcomes.    Time  3    Period  Weeks    Status  Achieved    Target Date  06/06/19        PT Long Term Goals - 06/11/19 1335      PT LONG TERM GOAL #1   Title  Patient will ambulate with gait speed of at least 0.8 m/s with least restrictive device in order to ambulate in the community.    Baseline  0.78 m/s    Time  6     Period  Weeks    Status  On-going      PT LONG TERM GOAL #2   Title  Patient will improve FOTO score by at least 10% in order to demonstrate improved tolerance to activity.    Baseline  28%    Time  6    Period  Weeks    Status  Achieved      PT LONG TERM GOAL #3   Title  Patient will report at least 75% improvement of symptoms for improved quality of life.    Time  6    Period  Weeks    Status  On-going      PT LONG TERM GOAL #4   Title  Patient will be able to walk for at least 30 minutes without AD in order to grocery shop.    Baseline  able to do 60 minutes, but with use shopping cart    Time  6    Period  Weeks    Status  Partially Met            Plan - 06/21/19 1147    Clinical Impression Statement  continued to focus on functional strengthening, gait, ROM and stairs.  Pt tends to ER Rt LE when eccentrically lowering Lt LE and also with gait.  Worked correcting this, however mostly habbit at this point.  Gait training with focus on keeping LE in neutrals and increasing WB time on Rt LE.  Able to negotiate stairs reciprocally using 1 HR, however more difficult on 6" side.    Personal Factors and Comorbidities  Age;Comorbidity 3+;Past/Current Experience;Fitness    Comorbidities  hypertension, Hx CVA, Diabetes, Depression, obesity    Examination-Activity Limitations  Bed Mobility;Bend;Carry;Locomotion Level;Sit;Sleep;Squat;Stairs;Stand;Lift;Transfers    Examination-Participation Restrictions  Church;Cleaning;Driving;Laundry;Meal Prep;Volunteer;Yard Work    Stability/Clinical Decision Making  Stable/Uncomplicated    Rehab Potential  Good    PT Frequency  3x / week    PT Duration  2 weeks    PT Treatment/Interventions  ADLs/Self Care Home Management;Aquatic Therapy;Cryotherapy;Electrical Stimulation;Iontophoresis 31m/ml Dexamethasone;Moist Heat;Ultrasound;Contrast Bath;DME Instruction;Gait training;Stair training;Functional mobility training;Therapeutic activities;Therapeutic  exercise;Balance training;Neuromuscular re-education;Patient/family education;Manual techniques;Orthotic Fit/Training;Manual lymph drainage;Compression bandaging;Scar mobilization;Passive range of motion;Dry needling;Energy conservation;Splinting;Taping;Vasopneumatic Device;Joint Manipulations    PT Next Visit Plan  Continue to progress knee  AROM and RLE strength as tolerated. Continue to progress stairs as tolerated to increased height.    PT Home Exercise Plan  05/16/19: R knee flexion stretch in seated; 06/05/19: tandem stance and SLS with HHA PRN    Consulted and Agree with Plan of Care  Patient       Patient will benefit from skilled therapeutic intervention in order to improve the following deficits and impairments:  Abnormal gait, Decreased activity tolerance, Decreased balance, Decreased endurance, Decreased coordination, Decreased mobility, Decreased range of motion, Decreased strength, Difficulty walking, Increased edema, Increased fascial restricitons, Increased muscle spasms, Impaired flexibility, Obesity, Pain  Visit Diagnosis: Stiffness of right knee, not elsewhere classified  Other abnormalities of gait and mobility  Muscle weakness (generalized)  Right knee pain, unspecified chronicity     Problem List Patient Active Problem List   Diagnosis Date Noted  . UTI (urinary tract infection) 05/28/2019  . Nausea & vomiting 05/28/2019  . Constipation 05/28/2019  . S/P TKR (total knee replacement), right 03/23/2019 05/07/2019  . Primary osteoarthritis of right knee 04/02/2019  . Unilateral primary osteoarthritis, right knee   . Pedal edema 02/25/2019  . Acute vertigo with vomiting and inability to stand 10/08/2018  . Type 2 diabetes mellitus ([place]) 10/08/2018  . Carotid artery disease ([place]) 03/15/2018  . Cerebrovascular accident (CVA) ([place]) 11/16/2017  . Morbid obesity ([place]) 11/09/2017  . Low back pain 10/26/2017  . Screening mammogram, encounter for 09/18/2017  . CAD S/P  percutaneous coronary angioplasty 11/04/2015  . NSTEMI (non-ST elevated myocardial infarction) ([place]) 11/03/2015  . Hypothyroidism 07/06/2010  . Hyperlipidemia LDL goal <100 08/28/2008  . Essential hypertension 11/13/2007  . 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 Diabetic peripheral neuropathy ([place]) 09/21/2006  . TOBACCO ABUSE 09/21/2006  . Adjustment disorder with mixed anxiety and depressed mood 09/21/2006  . CARPAL TUNNEL SYNDROME 09/21/2006  . COPD (chronic obstructive pulmonary disease) ([place]) 09/21/2006  . EDEMA 09/21/2006  . MIGRAINES, HX OF 09/21/2006   Teena Irani, PTA/CLT [phone]  Teena Irani 06/21/2019, 11:51 AM  [facility] [address] [place], [place], [address] Phone: [phone]   Fax:  [phone]  Name: Jennifer [name] MRN: [id number] Date of Birth: January 14, 1956

## 2019-06-24 ENCOUNTER — Encounter (HOSPITAL_COMMUNITY): Payer: Self-pay | Admitting: Physical Therapy

## 2019-06-24 ENCOUNTER — Other Ambulatory Visit: Payer: Self-pay

## 2019-06-24 ENCOUNTER — Ambulatory Visit (HOSPITAL_COMMUNITY): Payer: 59 | Admitting: Physical Therapy

## 2019-06-24 DIAGNOSIS — M6281 Muscle weakness (generalized): Secondary | ICD-10-CM

## 2019-06-24 DIAGNOSIS — M25661 Stiffness of right knee, not elsewhere classified: Secondary | ICD-10-CM | POA: Diagnosis not present

## 2019-06-24 DIAGNOSIS — M25561 Pain in right knee: Secondary | ICD-10-CM

## 2019-06-24 DIAGNOSIS — R2689 Other abnormalities of gait and mobility: Secondary | ICD-10-CM

## 2019-06-24 NOTE — Therapy (Signed)
[facility] [place], [place], [address] Phone: [phone]   Fax:  [phone]  Physical Therapy Treatment  Patient Details  Name: Jennifer [name] MRN: [id number] Date of Birth: January 03, 1956 Referring Provider (PT): Arther Abbott MD   Encounter Date: 06/24/2019  PT End of Session - 06/24/19 1306    Visit Number  12    Number of Visits  18    Date for PT Re-Evaluation  06/27/19   PN complete 06/11/19   Authorization Type  [employer]    Authorization Time Period  05/16/19-06/27/2019 (no auth req)    Authorization - Visit Number  5    Authorization - Number of Visits  10    PT Start Time  1300    PT Stop Time  1340    PT Time Calculation (min)  40 min    Equipment Utilized During Treatment  --   SBA/CGA during balance activities   Activity Tolerance  Patient tolerated treatment well    Behavior During Therapy  [facility] for tasks assessed/performed       Past Medical History:  Diagnosis Date  . CAD (coronary artery disease)    cath 5/23 100% dist RCA lesion treated with 2 overlapping Integrity Resolute DES ( 2.25x89m, 2.25x13m, 60% mid RCA treated medically, 99% OM2 not amenable to PCI, 70% D1 lesion, EF normal  . Hypercholesteremia   . Hypertension   . Hypothyroidism   . Stroke ([facility]CG[place]05/2020  . Thyroid disease   . Type 2 diabetes mellitus (HCR[place]4/27/2020    Past Surgical History:  Procedure Laterality Date  . ABDOMINAL HYSTERECTOMY    . CARDIAC CATHETERIZATION N/A 11/03/2015   Procedure: Left Heart Cath and Coronary Angiography;  Surgeon: DaLeonie ManMD;  [facility];  Service: Cardiovascular;  Laterality: N/A;  . CARDIAC CATHETERIZATION N/A 11/03/2015   Procedure: Coronary Stent Intervention;  Surgeon: DaLeonie ManMD;  [facility];  Service: Cardiovascular;  Laterality: N/A;  . CARPAL TUNNEL RELEASE    . CORONARY ANGIOPLASTY     1 stent  . TOTAL KNEE ARTHROPLASTY  Right 04/02/2019   Procedure: TOTAL KNEE ARTHROPLASTY;  Surgeon: HaCarole CivilMD;  Location: [facility];  Service: Orthopedics;  Laterality: Right;    There were no vitals filed for this visit.  Subjective Assessment - 06/24/19 1258    Subjective  Patient states her knee is not painful today but her big toe is. Her exercises have been going alright.    How long can you stand comfortably?  30-40 minutes    How long can you walk comfortably?  around 1 hour at store using shopping cart    Patient Stated Goals  walk without walker or cane    Currently in Pain?  No/denies                       [facility]/Exercise - 06/24/19 0001      Knee/Hip Exercises: Stretches   Active Hamstring Stretch  Right;Left;3 reps;30 seconds    Active Hamstring Stretch Limitations  from 12" step    Gastroc Stretch  3 reps;30 seconds    Gastroc Stretch Limitations  slant board      Knee/Hip Exercises: Aerobic   Recumbent Bike  5 minutes for mobility      Knee/Hip Exercises: Standing   Lateral Step Up  10 reps;Step Height: 4";Hand Hold: 1;Both;2 sets  Lateral Step Up Limitations  focus on eccentric control    Step Down  Right;Hand Hold: 2;15 reps;Step Height: 6"    Step Down Limitations  eccentric control with 1 HHA Rt harder than left    Stairs  2RT, all way across 6" and 4" side reciprocal, single hand rail; 2RT 7 in, step tp pattern, single hand rail    Gait Training  226' no AD, cueing for heel to toe transition    Other Standing Knee Exercises  TKE with purple band 2 x 10    Other Standing Knee Exercises  tandem stance 3x30 seconds each;  SLS 3x 30 second holds bilateral with intermittent UE support      Knee/Hip Exercises: Seated   Sit to Sand  2 sets;10 reps      Knee/Hip Exercises: Supine   Knee Extension  AROM    Knee Extension Limitations  0    Knee Flexion  AROM    Knee Flexion Limitations  107             PT Education - 06/24/19 1305    Education  Details  Review of HEP and completing HEP for improvements    Person(s) Educated  Patient    Methods  Explanation    Comprehension  Verbalized understanding       PT Short Term Goals - 06/11/19 1335      PT SHORT TERM GOAL #1   Title  Patient will be independent with HEP in order to optimze functional outcomes.    Time  3    Period  Weeks    Status  Achieved    Target Date  06/06/19        PT Long Term Goals - 06/11/19 1335      PT LONG TERM GOAL #1   Title  Patient will ambulate with gait speed of at least 0.8 m/s with least restrictive device in order to ambulate in the community.    Baseline  0.78 m/s    Time  6    Period  Weeks    Status  On-going      PT LONG TERM GOAL #2   Title  Patient will improve FOTO score by at least 10% in order to demonstrate improved tolerance to activity.    Baseline  28%    Time  6    Period  Weeks    Status  Achieved      PT LONG TERM GOAL #3   Title  Patient will report at least 75% improvement of symptoms for improved quality of life.    Time  6    Period  Weeks    Status  On-going      PT LONG TERM GOAL #4   Title  Patient will be able to walk for at least 30 minutes without AD in order to grocery shop.    Baseline  able to do 60 minutes, but with use shopping cart    Time  6    Period  Weeks    Status  Partially Met            Plan - 06/24/19 1307    Clinical Impression Statement  Patient able to navigate stairs on 6 inch step with improving reciprocal pattern using UE assist. She has difficulty performing lateral step up on 6 inch step and demonstrates impaired quad and hip strength. Patient performs with improved control on 4 inch step. She has impaired static balance with   tandem balance and single leg stance and requires frequent UE assist to maintain balance. She performs sit to stands well without UE assist. Patient improving well with PT and we will anticipate discharge this week. Patient will continue to benefit  from skilled physical therapy in order to reduce impairment and improve function.    Personal Factors and Comorbidities  Age;Comorbidity 3+;Past/Current Experience;Fitness    Comorbidities  hypertension, Hx CVA, Diabetes, Depression, obesity    Examination-Activity Limitations  Bed Mobility;Bend;Carry;Locomotion Level;Sit;Sleep;Squat;Stairs;Stand;Lift;Transfers    Examination-Participation Restrictions  Church;Cleaning;Driving;Laundry;Meal Prep;Volunteer;Yard Work    Stability/Clinical Decision Making  Stable/Uncomplicated    Rehab Potential  Good    PT Frequency  3x / week    PT Duration  2 weeks    PT Treatment/Interventions  ADLs/Self Care Home Management;Aquatic Therapy;Cryotherapy;Electrical Stimulation;Iontophoresis 44m/ml Dexamethasone;Moist Heat;Ultrasound;Contrast Bath;DME Instruction;Gait training;Stair training;Functional mobility training;Therapeutic activities;Therapeutic exercise;Balance training;Neuromuscular re-education;Patient/family education;Manual techniques;Orthotic Fit/Training;Manual lymph drainage;Compression bandaging;Scar mobilization;Passive range of motion;Dry needling;Energy conservation;Splinting;Taping;Vasopneumatic Device;Joint Manipulations    PT Next Visit Plan  Continue to progress knee AROM and RLE strength as tolerated. Continue to progress stairs as tolerated to increased height. anticipate d/c this week    PT Home Exercise Plan  05/16/19: R knee flexion stretch in seated; 06/05/19: tandem stance and SLS with HHA PRN    Consulted and Agree with Plan of Care  Patient       Patient will benefit from skilled therapeutic intervention in order to improve the following deficits and impairments:  Abnormal gait, Decreased activity tolerance, Decreased balance, Decreased endurance, Decreased coordination, Decreased mobility, Decreased range of motion, Decreased strength, Difficulty walking, Increased edema, Increased fascial restricitons, Increased muscle spasms, Impaired  flexibility, Obesity, Pain  Visit Diagnosis: Stiffness of right knee, not elsewhere classified  Other abnormalities of gait and mobility  Muscle weakness (generalized)  Right knee pain, unspecified chronicity     Problem List Patient Active Problem List   Diagnosis Date Noted  . UTI (urinary tract infection) 05/28/2019  . Nausea & vomiting 05/28/2019  . Constipation 05/28/2019  . S/P TKR (total knee replacement), right 03/23/2019 05/07/2019  . Primary osteoarthritis of right knee 04/02/2019  . Unilateral primary osteoarthritis, right knee   . Pedal edema 02/25/2019  . Acute vertigo with vomiting and inability to stand 10/08/2018  . Type 2 diabetes mellitus ([place] 10/08/2018  . Carotid artery disease ([place] 03/15/2018  . Cerebrovascular accident (CVA) ([place] 11/16/2017  . Morbid obesity (HH[place] 11/09/2017  . Low back pain 10/26/2017  . Screening mammogram, encounter for 09/18/2017  . CAD S/P percutaneous coronary angioplasty 11/04/2015  . NSTEMI (non-ST elevated myocardial infarction) ([place] 11/03/2015  . Hypothyroidism 07/06/2010  . Hyperlipidemia LDL goal <100 08/28/2008  . Essential hypertension 11/13/2007  . 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 Diabetic peripheral neuropathy ([place] 09/21/2006  . TOBACCO ABUSE 09/21/2006  . Adjustment disorder with mixed anxiety and depressed mood 09/21/2006  . CARPAL TUNNEL SYNDROME 09/21/2006  . COPD (chronic obstructive pulmonary disease) ([place] 09/21/2006  . EDEMA 09/21/2006  . MIGRAINES, HX OF 09/21/2006    1:44 PM, 06/24/19 AMearl LatinPT, DPT Physical Therapist [facility] [address]Phone: [phone]  Fax:  [phone] Name: Jennifer ATKINMRN: [id number] of Birth: [birth year]

## 2019-06-26 ENCOUNTER — Telehealth (HOSPITAL_COMMUNITY): Payer: Self-pay | Admitting: Physical Therapy

## 2019-06-26 ENCOUNTER — Ambulatory Visit (HOSPITAL_COMMUNITY): Payer: 59 | Admitting: Physical Therapy

## 2019-06-26 NOTE — Telephone Encounter (Signed)
pt called to cancel today and said she will come tomorrow

## 2019-06-27 ENCOUNTER — Ambulatory Visit (HOSPITAL_COMMUNITY): Payer: 59 | Admitting: Physical Therapy

## 2019-06-27 ENCOUNTER — Other Ambulatory Visit: Payer: Self-pay

## 2019-06-27 DIAGNOSIS — M25661 Stiffness of right knee, not elsewhere classified: Secondary | ICD-10-CM | POA: Diagnosis not present

## 2019-06-27 DIAGNOSIS — M25561 Pain in right knee: Secondary | ICD-10-CM

## 2019-06-27 DIAGNOSIS — M6281 Muscle weakness (generalized): Secondary | ICD-10-CM

## 2019-06-27 DIAGNOSIS — R2689 Other abnormalities of gait and mobility: Secondary | ICD-10-CM

## 2019-06-27 NOTE — Therapy (Addendum)
[facility] [address] [place], [place], [address] Phone: [phone]   Fax:  [phone]  Physical Therapy Treatment and Discharge Summary # OF FEET WALKED:   Community ambulator ROM:  Flexion: 115            Extension: 0  Patient Details  Name: Jennifer [name] MRN: [id number] Date of Birth: [birth year] Referring Provider (PT): Arther Abbott MD   Encounter Date: 06/27/2019   PHYSICAL THERAPY DISCHARGE SUMMARY  Visits from Start of Care: 13  Current functional level related to goals / functional outcomes: See below   Remaining deficits: See below   Education / Equipment: See below  Plan: Patient agrees to discharge.  Patient goals were met. Patient is being discharged due to being pleased with the current functional level.  ?????     Progress Note   Reporting Period 05/16/19 to 06/27/19   See note below for Objective Data and Assessment of Progress/Goals 4:15 PM, 06/27/19 Mearl Latin PT, DPT Physical Therapist at [facility]   PT End of Session - 06/27/19 1324    Visit Number  13    Number of Visits  18    Date for PT Re-Evaluation  06/27/19   PN complete 06/11/19   Authorization Type  [employer]    Authorization Time Period  05/16/19-06/27/2019 (no auth req)    Authorization - Visit Number  6    Authorization - Number of Visits  10    PT Start Time  1157    PT Stop Time  1330    PT Time Calculation (min)  27 min    Equipment Utilized During Treatment  --   SBA/CGA during balance activities   Activity Tolerance  Patient tolerated treatment well    Behavior During Therapy  WFL for tasks assessed/performed       Past Medical History:  Diagnosis Date  . CAD (coronary artery disease)    cath 5/23 100% dist RCA lesion treated with 2 overlapping Integrity Resolute DES ( 2.25x23m, 2.25x172m, 60% mid RCA treated medically, 99% OM2 not amenable to PCI, 70% D1 lesion, EF normal  .  Hypercholesteremia   . Hypertension   . Hypothyroidism   . Stroke ([facility]  . Thyroid disease   . Type 2 diabetes mellitus ([facility]4/27/2020    Past Surgical History:  Procedure Laterality Date  . ABDOMINAL HYSTERECTOMY    . CARDIAC CATHETERIZATION N/A 11/03/2015   Procedure: Left Heart Cath and Coronary Angiography;  Surgeon: DaLeonie ManMD;  [facility];  Service: Cardiovascular;  Laterality: N/A;  . CARDIAC CATHETERIZATION N/A 11/03/2015   Procedure: Coronary Stent Intervention;  Surgeon: DaLeonie ManMD;  [facility];  Service: Cardiovascular;  Laterality: N/A;  . CARPAL TUNNEL RELEASE    . CORONARY ANGIOPLASTY     1 stent  . TOTAL KNEE ARTHROPLASTY Right 04/02/2019   Procedure: TOTAL KNEE ARTHROPLASTY;  Surgeon: HaCarole CivilMD;  Location: [facility];  Service: Orthopedics;  Laterality: Right;    There were no vitals filed for this visit.  Subjective Assessment - 06/27/19 1311    Subjective  pt states she is doing well overall and is ready for discharge.  No problems or issues.    Currently in Pain?  No/denies         [facility] 1313      Assessment   Medical Diagnosis  R TKA    Referring Provider (PT)  Arther Abbott MD    Onset Date/Surgical Date  03/23/19    Next MD Visit  Oct 2021    Prior Therapy  yes, Home health      Precautions   Precautions  None      Restrictions   Weight Bearing Restrictions  No      Home Environment   Living Environment  Private residence      Prior Function   Level of Independence  Independent      Cognition   Overall Cognitive Status  Within Functional Limits for tasks assessed      Observation/Other Assessments   Observations  Incision appears well healed and intact    Focus on Therapeutic Outcomes (FOTO)   19% limited   was 41% on 12/3, 28% 12/29 limited     AROM   Right Knee Extension  0   was 2   Right Knee Flexion  115   was 100     Strength    Right Hip Flexion  5/5   was 3+   Right Hip ABduction  5/5   was 4   Left Hip Flexion  4+/5   was 4-   Left Hip ABduction  4+/5   was 4   Right Knee Flexion  4+/5    Right Knee Extension  5/5   was 4+   Left Knee Flexion  5/5    Left Knee Extension  5/5    Right Ankle Dorsiflexion  5/5    Left Ankle Dorsiflexion  5/5      Ambulation/Gait   Ambulation/Gait  Yes    Ambulation Distance (Feet)  340 Feet   was 220   Assistive device  None    Gait Pattern  Decreased stride length;Decreased stance time - left;Decreased dorsiflexion - left;Trendelenburg    Gait Comments  2MWT                   [facility] Adult PT Treatment/Exercise - 06/27/19 1313      Ambulation/Gait   Ambulation/Gait Assistance  7: Independent    Gait velocity  0.864 m/s   was 0.775 m/s              PT Short Term Goals - 06/11/19 1335      PT SHORT TERM GOAL #1   Title  Patient will be independent with HEP in order to optimze functional outcomes.    Time  3    Period  Weeks    Status  Achieved    Target Date  06/06/19        PT Long Term Goals - 06/27/19 1333      PT LONG TERM GOAL #1   Title  Patient will ambulate with gait speed of at least 0.8 m/s with least restrictive device in order to ambulate in the community.    Baseline  0.78 m/s    Time  6    Period  Weeks    Status  Achieved      PT LONG TERM GOAL #2   Title  Patient will improve FOTO score by at least 10% in order to demonstrate improved tolerance to activity.    Baseline  28%    Time  6    Period  Weeks    Status  Achieved      PT LONG TERM GOAL #3   Title  Patient will report at least 75% improvement of  symptoms for improved quality of life.    Time  6    Period  Weeks    Status  On-going      PT LONG TERM GOAL #4   Title  Patient will be able to walk for at least 30 minutes without AD in order to grocery shop.    Baseline  able to do 60 minutes, but with use shopping cart    Time  6    Period  Weeks     Status  Achieved            Plan - 06/27/19 1334    Clinical Impression Statement  Retested all measures today with resultant improvement in all areas.  Pt feels she has 100% improved Rt LE function, has met all goals and is ready for discharge.  Rt knee ROM today 0-115, completing standard height steps reciprocally and completing community ambulation.  Pt independent with therex and with no other questions or concerns.    Personal Factors and Comorbidities  Age;Comorbidity 3+;Past/Current Experience;Fitness    Comorbidities  hypertension, Hx CVA, Diabetes, Depression, obesity    Examination-Activity Limitations  Bed Mobility;Bend;Carry;Locomotion Level;Sit;Sleep;Squat;Stairs;Stand;Lift;Transfers    Examination-Participation Restrictions  Church;Cleaning;Driving;Laundry;Meal Prep;Volunteer;Yard Work    Stability/Clinical Decision Making  Stable/Uncomplicated    Rehab Potential  Good    PT Frequency  3x / week    PT Duration  2 weeks    PT Treatment/Interventions  ADLs/Self Care Home Management;Aquatic Therapy;Cryotherapy;Electrical Stimulation;Iontophoresis 87m/ml Dexamethasone;Moist Heat;Ultrasound;Contrast Bath;DME Instruction;Gait training;Stair training;Functional mobility training;Therapeutic activities;Therapeutic exercise;Balance training;Neuromuscular re-education;Patient/family education;Manual techniques;Orthotic Fit/Training;Manual lymph drainage;Compression bandaging;Scar mobilization;Passive range of motion;Dry needling;Energy conservation;Splinting;Taping;Vasopneumatic Device;Joint Manipulations    PT Next Visit Plan  discharge    PT Home Exercise Plan  05/16/19: R knee flexion stretch in seated; 06/05/19: tandem stance and SLS with HHA PRN    Consulted and Agree with Plan of Care  Patient       Patient will benefit from skilled therapeutic intervention in order to improve the following deficits and impairments:  Abnormal gait, Decreased activity tolerance, Decreased  balance, Decreased endurance, Decreased coordination, Decreased mobility, Decreased range of motion, Decreased strength, Difficulty walking, Increased edema, Increased fascial restricitons, Increased muscle spasms, Impaired flexibility, Obesity, Pain  Visit Diagnosis: Other abnormalities of gait and mobility  Muscle weakness (generalized)  Right knee pain, unspecified chronicity  Stiffness of right knee, not elsewhere classified     Problem List Patient Active Problem List   Diagnosis Date Noted  . UTI (urinary tract infection) 05/28/2019  . Nausea & vomiting 05/28/2019  . Constipation 05/28/2019  . S/P TKR (total knee replacement), right 03/23/2019 05/07/2019  . Primary osteoarthritis of right knee 04/02/2019  . Unilateral primary osteoarthritis, right knee   . Pedal edema 02/25/2019  . Acute vertigo with vomiting and inability to stand 10/08/2018  . Type 2 diabetes mellitus ([facility] 10/08/2018  . Carotid artery disease ([facility] 03/15/2018  . Cerebrovascular accident (CVA) ([facility] 11/16/2017  . Morbid obesity ([facility] 11/09/2017  . Low back pain 10/26/2017  . Screening mammogram, encounter for 09/18/2017  . CAD S/P percutaneous coronary angioplasty 11/04/2015  . NSTEMI (non-ST elevated myocardial infarction) ([facility] 11/03/2015  . Hypothyroidism 07/06/2010  . Hyperlipidemia LDL goal <100 08/28/2008  . Essential hypertension 11/13/2007  . 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 Diabetic peripheral neuropathy ([facility] 09/21/2006  . TOBACCO ABUSE 09/21/2006  . Adjustment disorder with mixed anxiety and depressed mood  09/21/2006  . CARPAL TUNNEL SYNDROME 09/21/2006  . COPD (chronic obstructive pulmonary disease) ([place]) 09/21/2006  . EDEMA 09/21/2006  . MIGRAINES, HX OF 09/21/2006      Patient has met all goals and is pleased with functional status at this time. I have reviewed the above  and agree with the findings. Patient is discharged from therapy at this time.  4:26 PM, 06/27/19 Mearl Latin PT, DPT Physical Therapist at [facility]  Teena Irani, PTA/CLT [phone]  Teena Irani 06/27/2019, 1:43 PM  [facility] [address] [place], [place], [address] Phone: [phone]   Fax:  [phone]  Name: Jennifer [name] MRN: [id number] Date of Birth: [birth year]

## 2019-07-01 ENCOUNTER — Ambulatory Visit (HOSPITAL_COMMUNITY): Payer: 59 | Admitting: Physical Therapy

## 2019-07-03 ENCOUNTER — Encounter (HOSPITAL_COMMUNITY): Payer: 59 | Admitting: Physical Therapy

## 2019-07-05 ENCOUNTER — Encounter (HOSPITAL_COMMUNITY): Payer: 59

## 2019-07-29 ENCOUNTER — Telehealth: Payer: Self-pay | Admitting: *Deleted

## 2019-07-29 NOTE — Telephone Encounter (Signed)
Patient called stating that her husband tested positive for covid Friday night. Patient stated that she has a cough, more congested than usual, runny nose and her back hurts. Patient wanted to know if she should be tested? Advised patient with her exposure and symptoms she should be tested. Patient stated that she is close to [facility] and will reach to them to schedule an appointment for testing. Patient was advised to makes sure she quarantines until her test results come back. Patient stated that she and her husband are in quarantine. Patient was given ER precautions and she verbalized understanding. Dr. Glori Bickers is out of the office.

## 2019-07-29 NOTE — Telephone Encounter (Signed)
Aware, thanks!

## 2019-07-29 NOTE — Telephone Encounter (Signed)
Noted  

## 2019-07-30 ENCOUNTER — Other Ambulatory Visit: Payer: Self-pay | Admitting: Family Medicine

## 2019-07-30 ENCOUNTER — Other Ambulatory Visit: Payer: Self-pay

## 2019-07-30 ENCOUNTER — Ambulatory Visit: Payer: 59 | Attending: Internal Medicine

## 2019-07-30 DIAGNOSIS — Z20822 Contact with and (suspected) exposure to covid-19: Secondary | ICD-10-CM

## 2019-07-31 ENCOUNTER — Telehealth: Payer: Self-pay | Admitting: *Deleted

## 2019-07-31 LAB — NOVEL CORONAVIRUS, NAA: SARS-CoV-2, NAA: DETECTED — AB

## 2019-07-31 NOTE — Telephone Encounter (Signed)
Addressed through result notes  

## 2019-08-01 ENCOUNTER — Other Ambulatory Visit: Payer: Self-pay | Admitting: Physician Assistant

## 2019-08-01 ENCOUNTER — Telehealth: Payer: Self-pay | Admitting: Physician Assistant

## 2019-08-01 DIAGNOSIS — I10 Essential (primary) hypertension: Secondary | ICD-10-CM

## 2019-08-01 DIAGNOSIS — I2510 Atherosclerotic heart disease of native coronary artery without angina pectoris: Secondary | ICD-10-CM

## 2019-08-01 DIAGNOSIS — E11618 Type 2 diabetes mellitus with other diabetic arthropathy: Secondary | ICD-10-CM

## 2019-08-01 DIAGNOSIS — I639 Cerebral infarction, unspecified: Secondary | ICD-10-CM

## 2019-08-01 DIAGNOSIS — U071 COVID-19: Secondary | ICD-10-CM

## 2019-08-01 NOTE — Telephone Encounter (Signed)
  I connected by phone with Drinda Butts on 08/01/2019 at 8:31 AM to discuss the potential use of an new treatment for mild to moderate COVID-19 viral infection in non-hospitalized patients.  This patient is a 64 y.o. female that meets the FDA criteria for Emergency Use Authorization of bamlanivimab or casirivimab\imdevimab.  Has a (+) direct SARS-CoV-2 viral test result  Has mild or moderate COVID-19   Is ? 65 years of age and weighs ? 40 kg  Is NOT hospitalized due to COVID-19  Is NOT requiring oxygen therapy or requiring an increase in baseline oxygen flow rate due to COVID-19  Is within 10 days of symptom onset  Has at least one of the high risk factor(s) for progression to severe COVID-19 and/or hospitalization as defined in EUA.  Specific high risk criteria : Hypertension, CVD and DMT2   I have spoken and communicated the following to the patient or parent/caregiver:  1. FDA has authorized the emergency use of bamlanivimab and casirivimab\imdevimab for the treatment of mild to moderate COVID-19 in adults and pediatric patients with positive results of direct SARS-CoV-2 viral testing who are 86 years of age and older weighing at least 40 kg, and who are at high risk for progressing to severe COVID-19 and/or hospitalization.  2. The significant known and potential risks and benefits of bamlanivimab and casirivimab\imdevimab, and the extent to which such potential risks and benefits are unknown.  3. Information on available alternative treatments and the risks and benefits of those alternatives, including clinical trials.  4. Patients treated with bamlanivimab and casirivimab\imdevimab should continue to self-isolate and use infection control measures (e.g., wear mask, isolate, social distance, avoid sharing personal items, clean and disinfect "high touch" surfaces, and frequent handwashing) according to CDC guidelines.   5. The patient or parent/caregiver has the option to accept  or refuse bamlanivimab or casirivimab\imdevimab .  After reviewing this information with the patient, The patient agreed to proceed with receiving the bamlanimivab infusion and will be provided a copy of the Fact sheet prior to receiving the infusion.   Pt is set up for tomorrow 08/02/19 @ 12:30pm. Sx onset 2/12. Directions given. She does not have access to her MyChart.  Angelena Form PA-C 08/01/2019 8:31 AM

## 2019-08-02 ENCOUNTER — Ambulatory Visit (HOSPITAL_COMMUNITY): Payer: 59

## 2019-08-03 ENCOUNTER — Ambulatory Visit (HOSPITAL_COMMUNITY)
Admission: RE | Admit: 2019-08-03 | Discharge: 2019-08-03 | Disposition: A | Discharge status: Home / Self Care | Payer: 59 | Source: Ambulatory Visit | Attending: Orthopedic Surgery | Admitting: Orthopedic Surgery

## 2019-08-03 DIAGNOSIS — Z9861 Coronary angioplasty status: Secondary | ICD-10-CM | POA: Diagnosis present

## 2019-08-03 DIAGNOSIS — I2510 Atherosclerotic heart disease of native coronary artery without angina pectoris: Secondary | ICD-10-CM

## 2019-08-03 DIAGNOSIS — I10 Essential (primary) hypertension: Secondary | ICD-10-CM

## 2019-08-03 DIAGNOSIS — E11618 Type 2 diabetes mellitus with other diabetic arthropathy: Secondary | ICD-10-CM

## 2019-08-03 DIAGNOSIS — I639 Cerebral infarction, unspecified: Secondary | ICD-10-CM | POA: Diagnosis present

## 2019-08-03 DIAGNOSIS — U071 COVID-19: Secondary | ICD-10-CM

## 2019-08-03 MED ORDER — METHYLPREDNISOLONE SODIUM SUCC 125 MG IJ SOLR: 125.0000 mg | Freq: Once | INTRAMUSCULAR | Status: DC | PRN

## 2019-08-03 MED ORDER — EPINEPHRINE 0.3 MG/0.3ML IJ SOAJ: 0.3000 mg | Freq: Once | INTRAMUSCULAR | Status: DC | PRN

## 2019-08-03 MED ORDER — DIPHENHYDRAMINE HCL 50 MG/ML IJ SOLN: 50.0000 mg | Freq: Once | INTRAMUSCULAR | Status: DC | PRN

## 2019-08-03 MED ORDER — SODIUM CHLORIDE 0.9 % IV SOLN
INTRAVENOUS | Status: DC | PRN
  Administered 2019-08-03: 250 mL via INTRAVENOUS

## 2019-08-03 MED ORDER — ALBUTEROL SULFATE HFA 108 (90 BASE) MCG/ACT IN AERS: 2.0000 | INHALATION_SPRAY | Freq: Once | RESPIRATORY_TRACT | Status: DC | PRN

## 2019-08-03 MED ORDER — SODIUM CHLORIDE 0.9 % IV SOLN
700.0000 mg | Freq: Once | INTRAVENOUS | Status: AC
  Administered 2019-08-03: 10:00:00 700 mg via INTRAVENOUS
  Filled 2019-08-03: qty 20

## 2019-08-03 MED ORDER — FAMOTIDINE IN NACL 20-0.9 MG/50ML-% IV SOLN: 20.0000 mg | Freq: Once | INTRAVENOUS | Status: DC | PRN

## 2019-08-03 NOTE — Progress Notes (Signed)
  Diagnosis: COVID-19  Physician:Dr Joya Gaskins  Procedure: [facility] Med: bamlanivimab infusion - Provided patient with bamlanimivab fact sheet for patients, parents and caregivers prior to infusion.  Complications: No immediate complications noted.  Discharge: Discharged home   Virgilio Belling 08/03/2019

## 2019-08-03 NOTE — Discharge Instructions (Signed)
10 Things You Can Do to Manage Your COVID-19 Symptoms at Home If you have possible or confirmed COVID-19: 1. Stay home from work and school. And stay away from other public places. If you must go out, avoid using any kind of public transportation, ridesharing, or taxis. 2. Monitor your symptoms carefully. If your symptoms get worse, call your healthcare provider immediately. 3. Get rest and stay hydrated. 4. If you have a medical appointment, call the healthcare provider ahead of time and tell them that you have or may have COVID-19. 5. For medical emergencies, call 911 and notify the dispatch personnel that you have or may have COVID-19. 6. Cover your cough and sneezes with a tissue or use the inside of your elbow. 7. Wash your hands often with soap and water for at least 20 seconds or clean your hands with an alcohol-based hand sanitizer that contains at least 60% alcohol. 8. As much as possible, stay in a specific room and away from other people in your home. Also, you should use a separate bathroom, if available. If you need to be around other people in or outside of the home, wear a mask. 9. Avoid sharing personal items with other people in your household, like dishes, towels, and bedding. 10. Clean all surfaces that are touched often, like counters, tabletops, and doorknobs. Use household cleaning sprays or wipes according to the label instructions. cdc.gov/coronavirus 12/12/2018 This information is not intended to replace advice given to you by your health care provider. Make sure you discuss any questions you have with your health care provider. Document Revised: 05/16/2019 Document Reviewed: 05/16/2019 Elsevier Patient Education  2020 Elsevier Inc. 10 Things You Can Do to Manage Your COVID-19 Symptoms at Home If you have possible or confirmed COVID-19: 11. Stay home from work and school. And stay away from other public places. If you must go out, avoid using any kind of public  transportation, ridesharing, or taxis. 12. Monitor your symptoms carefully. If your symptoms get worse, call your healthcare provider immediately. 13. Get rest and stay hydrated. 14. If you have a medical appointment, call the healthcare provider ahead of time and tell them that you have or may have COVID-19. 15. For medical emergencies, call 911 and notify the dispatch personnel that you have or may have COVID-19. 16. Cover your cough and sneezes with a tissue or use the inside of your elbow. 17. Wash your hands often with soap and water for at least 20 seconds or clean your hands with an alcohol-based hand sanitizer that contains at least 60% alcohol. 18. As much as possible, stay in a specific room and away from other people in your home. Also, you should use a separate bathroom, if available. If you need to be around other people in or outside of the home, wear a mask. 19. Avoid sharing personal items with other people in your household, like dishes, towels, and bedding. 20. Clean all surfaces that are touched often, like counters, tabletops, and doorknobs. Use household cleaning sprays or wipes according to the label instructions. cdc.gov/coronavirus 12/12/2018 This information is not intended to replace advice given to you by your health care provider. Make sure you discuss any questions you have with your health care provider. Document Revised: 05/16/2019 Document Reviewed: 05/16/2019 Elsevier Patient Education  2020 Elsevier Inc. What types of side effects do monoclonal antibody drugs cause?  Common side effects  In general, the more common side effects caused by monoclonal antibody drugs include: . Allergic reactions,   such as hives or itching . Flu-like signs and symptoms, including chills, fatigue, fever, and muscle aches and pains . Nausea, vomiting . Diarrhea . Skin rashes . Low blood pressure   The CDC is recommending patients who receive monoclonal antibody treatments wait  at least 90 days before being vaccinated.  Currently, there are no data on the safety and efficacy of mRNA COVID-19 vaccines in persons who received monoclonal antibodies or convalescent plasma as part of COVID-19 treatment. Based on the estimated half-life of such therapies as well as evidence suggesting that reinfection is uncommon in the 90 days after initial infection, vaccination should be deferred for at least 90 days, as a precautionary measure until additional information becomes available, to avoid interference of the antibody treatment with vaccine-induced immune responses. 

## 2019-08-20 ENCOUNTER — Ambulatory Visit: Payer: Self-pay | Admitting: Cardiology

## 2019-08-20 ENCOUNTER — Other Ambulatory Visit: Payer: Self-pay | Admitting: Cardiology

## 2019-08-20 DIAGNOSIS — I10 Essential (primary) hypertension: Secondary | ICD-10-CM

## 2019-10-02 ENCOUNTER — Other Ambulatory Visit: Payer: Self-pay | Admitting: Family Medicine

## 2019-10-02 NOTE — Telephone Encounter (Signed)
Last OV was a acute appt for UTI on 05/28/19, last filled on 09/25/18 #90 caps with 3 refills, please advise

## 2019-10-14 ENCOUNTER — Ambulatory Visit (INDEPENDENT_AMBULATORY_CARE_PROVIDER_SITE_OTHER): Payer: 59 | Admitting: Orthopedic Surgery

## 2019-10-14 ENCOUNTER — Other Ambulatory Visit: Payer: Self-pay

## 2019-10-14 VITALS — BP 165/62 | HR 75 | Temp 97.8°F | Ht 67.0 in | Wt 286.0 lb

## 2019-10-14 DIAGNOSIS — Z6841 Body Mass Index (BMI) 40.0 and over, adult: Secondary | ICD-10-CM | POA: Diagnosis not present

## 2019-10-14 DIAGNOSIS — R600 Localized edema: Secondary | ICD-10-CM

## 2019-10-14 DIAGNOSIS — Z96651 Presence of right artificial knee joint: Secondary | ICD-10-CM | POA: Diagnosis not present

## 2019-10-14 NOTE — Patient Instructions (Signed)
Call Dr Glori Bickers let her know we increased your lasix to twice daily since your legs are swelling / your weight today is 286/ weigh yourself daily

## 2019-10-14 NOTE — Progress Notes (Signed)
Chief Complaint  Patient presents with  . Leg Problem    Legs swelling.    64 year old female presents with bilateral leg edema  She had a total knee replacement told her  Her DVT prevention was Eliquis she is now not on it  She says she has bilateral chronic edema she is on Lasix once a day she went to [place] with a 13-hour trip and came back and her legs were severely swollen bilaterally with no calf pain no shortness of breath  .BP (!) 165/62   Pulse 75   Temp 97.8 F (36.6 C)   Ht 5\' 7"  (1.702 m)   Wt 286 lb (129.7 kg)   BMI 44.79 kg/m   She walks in a little labored but no shortness of breath she has bilateral pitting edema up to her knees  No calf pain or tenderness  Negative Homans' sign  Recommend compression stockings Weight every day Call her primary care doctor Start Lasix twice a day instead of once a day  Encounter Diagnoses  Name Primary?  . 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 Edema of both lower extremities   . Body mass index 40.0-44.9, adult ([place])   . Morbid obesity ([place])     The patient meets the AMA guidelines for Morbid (severe) obesity with a BMI > 40.0 and I have recommended weight loss.

## 2019-10-16 ENCOUNTER — Ambulatory Visit: Payer: 59 | Admitting: Family Medicine

## 2019-10-16 ENCOUNTER — Other Ambulatory Visit: Payer: Self-pay

## 2019-10-16 ENCOUNTER — Encounter: Payer: Self-pay | Admitting: Family Medicine

## 2019-10-16 VITALS — BP 148/68 | HR 82 | Temp 97.8°F | Ht 67.0 in | Wt 283.2 lb

## 2019-10-16 DIAGNOSIS — I10 Essential (primary) hypertension: Secondary | ICD-10-CM

## 2019-10-16 DIAGNOSIS — R600 Localized edema: Secondary | ICD-10-CM

## 2019-10-16 LAB — COMPREHENSIVE METABOLIC PANEL
ALT: 12 U/L (ref 0–35)
AST: 14 U/L (ref 0–37)
Albumin: 2.7 g/dL — ABNORMAL LOW (ref 3.5–5.2)
Alkaline Phosphatase: 78 U/L (ref 39–117)
BUN: 14 mg/dL (ref 6–23)
CO2: 32 mEq/L (ref 19–32)
Calcium: 8.4 mg/dL (ref 8.4–10.5)
Chloride: 105 mEq/L (ref 96–112)
Creatinine, Ser: 1.14 mg/dL (ref 0.40–1.20)
GFR: 47.92 mL/min — ABNORMAL LOW (ref >60.00)
Glucose, Bld: 155 mg/dL — ABNORMAL HIGH (ref 70–99)
Potassium: 3.7 mEq/L (ref 3.5–5.1)
Sodium: 140 mEq/L (ref 135–145)
Total Bilirubin: 0.3 mg/dL (ref 0.2–1.2)
Total Protein: 5.3 g/dL — ABNORMAL LOW (ref 6.0–8.3)

## 2019-10-16 MED ORDER — FUROSEMIDE 40 MG PO TABS: 40.0000 mg | ORAL_TABLET | Freq: Two times a day (BID) | ORAL | 1 refills | Status: DC

## 2019-10-16 NOTE — Patient Instructions (Addendum)
Watch your sodium Try to get most of your carbohydrates from produce (with the exception of white potatoes)  Eat less bread/pasta/rice/snack foods/cereals/sweets and other items from the middle of the grocery store (processed carbs)   Continue the hose Continue elevating feet   Drink water   Increase lasix to 40 mg twice daily for 2 days and then call and let us know how the swelling is (call us on Friday)  Labs now

## 2019-10-16 NOTE — Progress Notes (Signed)
Subjective:    Patient ID: Jennifer [name], female    DOB: 29-Sep-1955, 64 y.o.   MRN: [id number]  This visit occurred during the SARS-CoV-2 public health emergency.  Safety protocols were in place, including screening questions prior to the visit, additional usage of staff PPE, and extensive cleaning of exam room while observing appropriate contact time as indicated for disinfecting solutions.    HPI Pt presents with c/o leg swelling   Wt Readings from Last 3 Encounters:  10/16/19 283 lb 4 oz (128.5 kg)  10/14/19 286 lb (129.7 kg)  06/12/19 256 lb (116.1 kg)   44.36 kg/m  Has gained a lot of weight since she quit work     She had a total knee replacement 10/20  with f/u -orthopedics on 5/3 Noted swelling (also 13 hour trip to [place])  Recommended she wear compression stockings and inc lasix from once to twice daily   No sob  No cp   Today if first day of bid lasix   BP Readings from Last 3 Encounters:  10/16/19 (!) 148/68  10/14/19 (!) 165/62  08/03/19 (!) 159/82   Pulse Readings from Last 3 Encounters:  10/16/19 82  10/14/19 75  08/03/19 80   bp is mildly elevated also    Swelling was bad during trip- stopped every 2 hours to walk   Wearing knee high compression stockings today  Tight feeling legs /not tingly   Some weeping/vs sweat around ankles   Elevates her feet when she sits when she can  No longer working -so no prolonged standing      She has h/o past CAD and CVA She does take amlodipine and coreg   Lab Results  Component Value Date   CREATININE 1.47 (H) 05/27/2019   BUN 24 (H) 05/27/2019   NA 138 05/27/2019   K 4.0 05/27/2019   CL 104 05/27/2019   CO2 26 05/27/2019   Lab Results  Component Value Date   ALT 14 05/27/2019   AST 14 (L) 05/27/2019   ALKPHOS 93 05/27/2019   BILITOT 0.5 05/27/2019   last GFR 37  bp is stable today  No cp or palpitations or headaches or edema  No side effects to medicines  BP Readings from Last 3  Encounters:  10/16/19 (!) 148/68  10/14/19 (!) 165/62  08/03/19 (!) 159/82     No h/o CHF Last echo:  TTE 10/10/2018 1. The left ventricle has normal systolic function, with an ejection fraction of 55-60%. The cavity size was normal. Left ventricular diastolic Doppler parameters are consistent with impaired relaxation. No evidence of left ventricular regional wall  motion abnormalities. 2. Moderate hypokinesis of the left ventricular, basal inferior wall. 3. The right ventricle has normal systolic function. The cavity was normal. There is no increase in right ventricular wall thickness. Right ventricular systolic pressure normal with an estimated pressure of 32.0 mmHg. 4. The aortic valve is tricuspid. Mild aortic annular calcification noted. 5. The mitral valve is grossly normal. There is mild mitral annular calcification present. 6. The tricuspid valve is grossly normal. 7. The aortic root is normal in size and structure. 8. There is aneurysmal motion of the interatrial septum with evidence of PFO by agitated saline injection, right to left shunting seen intermittently. Review of Systems     Objective:   Physical Exam Constitutional:      General: She is not in acute distress.    Appearance: Normal appearance. She is well-developed. She is obese.  She is not ill-appearing or diaphoretic.  HENT:     Head: Normocephalic and atraumatic.  Eyes:     General: No scleral icterus.    Conjunctiva/sclera: Conjunctivae normal.     Pupils: Pupils are equal, round, and reactive to light.  Neck:     Thyroid: No thyromegaly.     Vascular: No carotid bruit or JVD.  Cardiovascular:     Rate and Rhythm: Normal rate and regular rhythm.     Heart sounds: Normal heart sounds. No gallop.   Pulmonary:     Effort: Pulmonary effort is normal. No respiratory distress.     Breath sounds: Normal breath sounds. No wheezing or rales.     Comments: Diffusely distant bs   No crackles  Abdominal:       General: Bowel sounds are normal. There is no distension or abdominal bruit.     Palpations: Abdomen is soft. There is no mass.     Tenderness: There is no abdominal tenderness.  Musculoskeletal:     Cervical back: Normal range of motion and neck supple.     Right lower leg: Edema present.     Left lower leg: Edema present.     Comments: 2-3 plus pitting edema to the knee bilateral  L calf is larger (baseline)    Lymphadenopathy:     Cervical: No cervical adenopathy.  Skin:    General: Skin is warm and dry.     Findings: No rash.     Comments: 2 to 3 plus pedal edema - to mid shin  No skin change or tenderness No weeping or skin breakdown  Feet are warm-too much edema to assess pulses    Neurological:     Mental Status: She is alert.     Sensory: No sensory deficit.     Coordination: Coordination normal.     Deep Tendon Reflexes: Reflexes are normal and symmetric. Reflexes normal.  Psychiatric:        Mood and Affect: Mood normal.           Assessment & Plan:   Problem List Items Addressed This Visit      Cardiovascular and Mediastinum   Essential hypertension (Chronic)    bp is elevated BP: (!) 148/68    Hopeful that inc lasix will bring this down some/ she is retaining fluid Also discussed DASH eating /wt loss       Relevant Medications   furosemide (LASIX) 40 MG tablet     Other   Morbid obesity ([place])    Aware that this adds to her pedal edema and other health problems   She plans to start walking      Pedal edema - Primary    Worse in the past month especially after travel  Reviewed most recent cardiology records and echo-no CHF noted  Taking lasix 20 mg daily and started wearing supp hose to knee  Will check labs today  Enc the hose as well as leg elevation (this is dependent) Labs now  Will try lasix 40 mg bid for 2 days then update  Will plan from there depending on short term response /also plan K check  Pt will work on lower sodium diet  also      Relevant Orders   Comprehensive metabolic panel (Completed)

## 2019-10-16 NOTE — Assessment & Plan Note (Signed)
Worse in the past month especially after travel  Reviewed most recent cardiology records and echo-no CHF noted  Taking lasix 20 mg daily and started wearing supp hose to knee  Will check labs today  Enc the hose as well as leg elevation (this is dependent) Labs now  Will try lasix 40 mg bid for 2 days then update  Will plan from there depending on short term response /also plan K check  Pt will work on lower sodium diet also

## 2019-10-16 NOTE — Assessment & Plan Note (Signed)
bp is elevated BP: (!) 148/68    Hopeful that inc lasix will bring this down some/ she is retaining fluid Also discussed DASH eating /wt loss

## 2019-10-16 NOTE — Assessment & Plan Note (Signed)
Aware that this adds to her pedal edema and other health problems   She plans to start walking

## 2019-10-17 ENCOUNTER — Telehealth: Payer: Self-pay | Admitting: *Deleted

## 2019-10-17 NOTE — Telephone Encounter (Signed)
Addressed through lab results  

## 2019-10-17 NOTE — Telephone Encounter (Signed)
Left VM requesting pt to call the office back regarding lab results  

## 2019-10-21 ENCOUNTER — Other Ambulatory Visit: Payer: Self-pay | Admitting: Family Medicine

## 2020-01-28 ENCOUNTER — Other Ambulatory Visit: Payer: Self-pay | Admitting: Podiatry

## 2020-01-28 DIAGNOSIS — M869 Osteomyelitis, unspecified: Secondary | ICD-10-CM

## 2020-02-05 ENCOUNTER — Other Ambulatory Visit: Payer: Self-pay

## 2020-02-05 ENCOUNTER — Encounter (HOSPITAL_COMMUNITY): Payer: Self-pay | Admitting: *Deleted

## 2020-02-05 ENCOUNTER — Telehealth: Payer: Self-pay | Admitting: Orthopedic Surgery

## 2020-02-05 ENCOUNTER — Emergency Department (HOSPITAL_COMMUNITY)
Admission: EM | Admit: 2020-02-05 | Discharge: 2020-02-05 | Disposition: A | Discharge status: Home / Self Care | Payer: 59 | Attending: Emergency Medicine | Admitting: Emergency Medicine

## 2020-02-05 DIAGNOSIS — Z5321 Procedure and treatment not carried out due to patient leaving prior to being seen by health care provider: Secondary | ICD-10-CM | POA: Diagnosis not present

## 2020-02-05 DIAGNOSIS — R2243 Localized swelling, mass and lump, lower limb, bilateral: Secondary | ICD-10-CM | POA: Insufficient documentation

## 2020-02-05 LAB — BASIC METABOLIC PANEL
Anion gap: 10 (ref 5–15)
BUN: 23 mg/dL (ref 8–23)
CO2: 25 mmol/L (ref 22–32)
Calcium: 8.3 mg/dL — ABNORMAL LOW (ref 8.9–10.3)
Chloride: 99 mmol/L (ref 98–111)
Creatinine, Ser: 1.47 mg/dL — ABNORMAL HIGH (ref 0.44–1.00)
GFR calc Af Amer: 43 mL/min — ABNORMAL LOW (ref >60)
GFR calc non Af Amer: 37 mL/min — ABNORMAL LOW (ref >60)
Glucose, Bld: 244 mg/dL — ABNORMAL HIGH (ref 70–99)
Potassium: 3.6 mmol/L (ref 3.5–5.1)
Sodium: 134 mmol/L — ABNORMAL LOW (ref 135–145)

## 2020-02-05 LAB — CBC WITH DIFFERENTIAL/PLATELET
Abs Immature Granulocytes: 0.14 10*3/uL — ABNORMAL HIGH (ref 0.00–0.07)
Basophils Absolute: 0 10*3/uL (ref 0.0–0.1)
Basophils Relative: 0 %
Eosinophils Absolute: 0 10*3/uL (ref 0.0–0.5)
Eosinophils Relative: 0 %
HCT: 35.3 % — ABNORMAL LOW (ref 36.0–46.0)
Hemoglobin: 11.3 g/dL — ABNORMAL LOW (ref 12.0–15.0)
Immature Granulocytes: 1 %
Lymphocytes Relative: 7 %
Lymphs Abs: 1 10*3/uL (ref 0.7–4.0)
MCH: 27.7 pg (ref 26.0–34.0)
MCHC: 32 g/dL (ref 30.0–36.0)
MCV: 86.5 fL (ref 80.0–100.0)
Monocytes Absolute: 1 10*3/uL (ref 0.1–1.0)
Monocytes Relative: 7 %
Neutro Abs: 12.5 10*3/uL — ABNORMAL HIGH (ref 1.7–7.7)
Neutrophils Relative %: 85 %
Platelets: 229 10*3/uL (ref 150–400)
RBC: 4.08 MIL/uL (ref 3.87–5.11)
RDW: 14.3 % (ref 11.5–15.5)
WBC: 14.6 10*3/uL — ABNORMAL HIGH (ref 4.0–10.5)
nRBC: 0 % (ref 0.0–0.2)

## 2020-02-05 LAB — LACTIC ACID, PLASMA: Lactic Acid, Venous: 0.8 mmol/L (ref 0.5–1.9)

## 2020-02-05 NOTE — Telephone Encounter (Signed)
Patient called, daughter Jennifer [name] also was on line - patient relays she has a sore and swollen left knee area, for past 2 days-said may have been a little red last night. Relays she is still seeing Dr Caprice Beaver, as per chart notes.Please advise if needs to follow back up with Dr Caprice Beaver, or with our office, or if other recommendation.

## 2020-02-05 NOTE — Telephone Encounter (Signed)
Called back to patient to offer appointment with Dr Aline Brochure as noted. Left message to return call.

## 2020-02-05 NOTE — ED Triage Notes (Signed)
Pt states that she is getting treated for a wound to left foot, was suppose to be taking antibiotics for the wound starting two weeks ago but had forgotten to take the additional medication, family presents to pt for further evaluation of left lower leg swelling,being warm to touch, decreased appetite,

## 2020-02-05 NOTE — ED Notes (Signed)
Registration advises that pt has left,

## 2020-02-05 NOTE — Telephone Encounter (Signed)
See Dr Aline Brochure for knee

## 2020-02-06 ENCOUNTER — Telehealth: Payer: Self-pay

## 2020-02-06 ENCOUNTER — Ambulatory Visit: Payer: 59 | Admitting: Internal Medicine

## 2020-02-06 ENCOUNTER — Encounter: Payer: Self-pay | Admitting: Internal Medicine

## 2020-02-06 DIAGNOSIS — I872 Venous insufficiency (chronic) (peripheral): Secondary | ICD-10-CM | POA: Diagnosis not present

## 2020-02-06 DIAGNOSIS — L02619 Cutaneous abscess of unspecified foot: Secondary | ICD-10-CM

## 2020-02-06 DIAGNOSIS — L02612 Cutaneous abscess of left foot: Secondary | ICD-10-CM

## 2020-02-06 MED ORDER — FUROSEMIDE 40 MG PO TABS: 40.0000 mg | ORAL_TABLET | Freq: Two times a day (BID) | ORAL | 3 refills | Status: DC

## 2020-02-06 MED ORDER — AMOXICILLIN-POT CLAVULANATE 875-125 MG PO TABS: 1.0000 | ORAL_TABLET | Freq: Two times a day (BID) | ORAL | 0 refills | Status: DC

## 2020-02-06 MED ORDER — CEFTRIAXONE SODIUM 1 G IJ SOLR
1.0000 g | Freq: Once | INTRAMUSCULAR | Status: AC
  Administered 2020-02-06: 1 g via INTRAMUSCULAR

## 2020-02-06 NOTE — Assessment & Plan Note (Signed)
Seems worse now Not sure she remembers her furosemide---but she thinks the higher dose did help more (was taking a dose in evening and stopped due to nocturia). Will change to 40mg  daily

## 2020-02-06 NOTE — Progress Notes (Signed)
Subjective:    Patient ID: Jennifer [name], female    DOB: [birth year], 64 y.o.   MRN: [id number]  HPI Here due to concern about a leg infection This visit occurred during the SARS-CoV-2 public health emergency.  Safety protocols were in place, including screening questions prior to the visit, additional usage of staff PPE, and extensive cleaning of exam room while observing appropriate contact time as indicated for disinfecting solutions.   Started with left foot problems a year ago Recurrent problems 2 weeks ago---"cut on and squeezed" by podiatrist Pus did come out Got antibiotic but only took for 2 days---"I forget"  Went to ER yesterday---daughter was concerned Wasn't seen due to wait Not really painful  Current Outpatient Medications on File Prior to Visit  Medication Sig Dispense Refill  . acetaminophen (TYLENOL) 325 MG tablet Take 2 tablets (650 mg total) by mouth every 4 (four) hours as needed for mild pain, fever or headache. 20 tablet 2  . amLODipine (NORVASC) 10 MG tablet Take 1 tablet (10 mg total) by mouth daily. 30 tablet 5  . aspirin EC 81 MG tablet Take 1 tablet (81 mg total) by mouth 2 (two) times daily. Hold aspirin while you are on apixaban/Eliquis 1 tablet 0  . atorvastatin (LIPITOR) 80 MG tablet TAKE 1 TABLET BY MOUTH ONCE DAILY AT  6  PM 90 tablet 2  . buPROPion (WELLBUTRIN SR) 150 MG 12 hr tablet Take 1 tablet by mouth twice daily 180 tablet 0  . carvedilol (COREG) 12.5 MG tablet Take 0.5 tablets (6.25 mg total) by mouth 2 (two) times daily with a meal. 30 tablet 3  . DULoxetine (CYMBALTA) 60 MG capsule Take 1 capsule by mouth once daily 90 capsule 1  . furosemide (LASIX) 20 MG tablet Take 1 tablet (20 mg total) by mouth daily. (Patient taking differently: Take 20 mg by mouth 2 (two) times daily. ) 90 tablet 2  . furosemide (LASIX) 40 MG tablet Take 1 tablet (40 mg total) by mouth 2 (two) times daily. 60 tablet 1  . Insulin Human (INSULIN PUMP) SOLN Inject into the  skin continuous. Patient may bolus up to 40 units at a time, with a maximum daily limit of 120 units    . levothyroxine (SYNTHROID) 137 MCG tablet Take 137 mcg by mouth daily before breakfast.    . nitroGLYCERIN (NITROSTAT) 0.4 MG SL tablet Place 1 tablet (0.4 mg total) under the tongue every 5 (five) minutes x 3 doses as needed for chest pain. 25 tablet 3  . sitaGLIPtin (JANUVIA) 100 MG tablet Take 100 mg by mouth daily.     Marland Kitchen doxycycline (VIBRAMYCIN) 100 MG capsule Take 100 mg by mouth 2 (two) times daily. (Patient not taking: Reported on 02/06/2020)     No current facility-administered medications on file prior to visit.    No Known Allergies  Past Medical History:  Diagnosis Date  . CAD (coronary artery disease)    cath 5/23 100% dist RCA lesion treated with 2 overlapping Integrity Resolute DES ( 2.25x82mm, 2.25x64mm), 60% mid RCA treated medically, 99% OM2 not amenable to PCI, 70% D1 lesion, EF normal  . Hypercholesteremia   . Hypertension   . Hypothyroidism   . Stroke ([place]) 10/2018  . Thyroid disease   . Type 2 diabetes mellitus ([place]) 10/08/2018    Past Surgical History:  Procedure Laterality Date  . ABDOMINAL HYSTERECTOMY    . CARDIAC CATHETERIZATION N/A 11/03/2015   Procedure: Left Heart Cath and Coronary  Angiography;  Surgeon: Leonie Man, MD;  Location: [facility];  Service: Cardiovascular;  Laterality: N/A;  . CARDIAC CATHETERIZATION N/A 11/03/2015   Procedure: Coronary Stent Intervention;  Surgeon: Leonie Man, MD;  Location: [facility];  Service: Cardiovascular;  Laterality: N/A;  . CARPAL TUNNEL RELEASE    . CORONARY ANGIOPLASTY     1 stent  . TOTAL KNEE ARTHROPLASTY Right 04/02/2019   Procedure: TOTAL KNEE ARTHROPLASTY;  Surgeon: Carole Civil, MD;  Location: [facility];  Service: Orthopedics;  Laterality: Right;    Family History  Problem Relation Age of Onset  . Heart disease Mother   . Stroke Sister   . Heart attack Brother      Social History   Socioeconomic History  . Marital status: Married    Spouse name: Not on file  . Number of children: Not on file  . Years of education: Not on file  . Highest education level: Not on file  Occupational History  . Not on file  Tobacco Use  . Smoking status: Former Smoker    Packs/day: 0.25    Types: Cigarettes    Quit date: 10/07/2018    Years since quitting: 1.3  . Smokeless tobacco: Never Used  Substance and Sexual Activity  . Alcohol use: No    Alcohol/week: 0.0 standard drinks  . Drug use: No  . Sexual activity: Not on file  Other Topics Concern  . Not on file  Social History Narrative  . Not on file   Social Determinants of Health   Financial Resource Strain:   . Difficulty of Paying Living Expenses: Not on file  Food Insecurity:   . Worried About Charity fundraiser in the Last Year: Not on file  . Ran Out of Food in the Last Year: Not on file  Transportation Needs:   . Lack of Transportation (Medical): Not on file  . Lack of Transportation (Non-Medical): Not on file  Physical Activity:   . Days of Exercise per Week: Not on file  . Minutes of Exercise per Session: Not on file  Stress:   . Feeling of Stress : Not on file  Social Connections:   . Frequency of Communication with Friends and Family: Not on file  . Frequency of Social Gatherings with Friends and Family: Not on file  . Attends Religious Services: Not on file  . Active Member of Clubs or Organizations: Not on file  . Attends Archivist Meetings: Not on file  . Marital Status: Not on file  Intimate Partner Violence:   . Fear of Current or Ex-Partner: Not on file  . Emotionally Abused: Not on file  . Physically Abused: Not on file  . Sexually Abused: Not on file   Review of [place] off toilet seat yesterday---twisted foot some then No fever No N/V     Objective:   Physical Exam Constitutional:      Appearance: Normal appearance.  Musculoskeletal:      Comments: Fluctuant tender mass--- ~2cm in circumference along lateral left midfoot Not particularly red  3+ edema in left calf--not tender though Only 1+ on right  Neurological:     Mental Status: She is alert.            Assessment & Plan:

## 2020-02-06 NOTE — Assessment & Plan Note (Signed)
Has been following with podiatrist--and has CT scan to delineate better what this mass is-----but clearly seems like it has pus Because it is closed--I will not incise (in the foot of this diabetic patient!) It may be improved because she restarted the doxycycline yesterday  Will give rocephin 1 gm now Add augmentin for broad coverage Will schedule follow up next week with Dr T (or me)

## 2020-02-06 NOTE — Addendum Note (Signed)
Addended by: Brenton Grills on: 4/56/2563 89:37 PM   Modules accepted: Orders

## 2020-02-06 NOTE — Patient Instructions (Signed)
Please start the amoxicillin/clauvulante twice a day with food---in addition to the doxycycline (don't take together). Change the furosemide to 40mg  twice a day ---- around 8AM and 1PM

## 2020-02-06 NOTE — Telephone Encounter (Signed)
Okay---if does have cellulitis, can treat with rocephin if needed or discuss oral therapy that she may remember?

## 2020-02-06 NOTE — Telephone Encounter (Signed)
Jennifer [name] pts daughter (DPR not signed) knot on lt side of foot and seeing foot dr in [place]. Pt is presently on abx by foot dr. [name] has some memory issues  And Jennifer [name] is not sure pt is taking her abx correctly.Jennifer [name] is afraid the lt leg has infection in it.lt lower leg started to swell 02/04/20. Today pt has very swollen lt lower leg and redness from lt knee to ankle with pain in the calf of leg(pain level at least a 6) pt is using walker to walk and leg does feel warm to touch. Pt has not had CP or SOB. Jennifer [name] said pt refuses to go back to ED (pt waited 4 hrs last night at ED (had labs) and then LWBS). Pt does not want to go to UC. I spoke with Dr Glori Bickers and advised Jennifer [name] try to get pt to go back to ED or UC for eval. If pt refuses keep appt with Dr Silvio Pate today at 3:30. UC & ED precautions given and Jennifer [name] voiced understanding. Jennifer [name] will try to talk her mom into going to ED or UC and if she agrees Jennifer [name] will cb and cancel appt with Dr Silvio Pate today. If not she will have pt at [facility] for appt with Dr Silvio Pate. FYI to Dr Glori Bickers and Dr Silvio Pate.

## 2020-02-10 ENCOUNTER — Ambulatory Visit: Payer: 59 | Admitting: Family Medicine

## 2020-02-10 DIAGNOSIS — Z0289 Encounter for other administrative examinations: Secondary | ICD-10-CM

## 2020-02-11 ENCOUNTER — Ambulatory Visit: Payer: 59 | Admitting: Family Medicine

## 2020-02-11 ENCOUNTER — Other Ambulatory Visit: Payer: Self-pay

## 2020-02-11 ENCOUNTER — Ambulatory Visit (HOSPITAL_COMMUNITY)
Admission: RE | Admit: 2020-02-11 | Discharge: 2020-02-11 | Disposition: A | Discharge status: Home / Self Care | Payer: 59 | Source: Ambulatory Visit | Attending: Podiatry | Admitting: Podiatry

## 2020-02-11 DIAGNOSIS — M869 Osteomyelitis, unspecified: Secondary | ICD-10-CM

## 2020-02-11 MED ORDER — GADOBUTROL 1 MMOL/ML IV SOLN
10.0000 mL | Freq: Once | INTRAVENOUS | Status: AC | PRN
  Administered 2020-02-11: 10 mL via INTRAVENOUS

## 2020-03-01 ENCOUNTER — Other Ambulatory Visit: Payer: Self-pay | Admitting: Family Medicine

## 2020-03-13 ENCOUNTER — Ambulatory Visit (INDEPENDENT_AMBULATORY_CARE_PROVIDER_SITE_OTHER): Payer: 59 | Admitting: Family Medicine

## 2020-03-13 ENCOUNTER — Telehealth: Payer: Self-pay

## 2020-03-13 ENCOUNTER — Other Ambulatory Visit: Payer: Self-pay

## 2020-03-13 ENCOUNTER — Encounter: Payer: Self-pay | Admitting: Family Medicine

## 2020-03-13 VITALS — BP 128/76 | HR 72 | Temp 96.8°F | Ht 67.0 in | Wt 282.0 lb

## 2020-03-13 DIAGNOSIS — E1169 Type 2 diabetes mellitus with other specified complication: Secondary | ICD-10-CM

## 2020-03-13 DIAGNOSIS — I2510 Atherosclerotic heart disease of native coronary artery without angina pectoris: Secondary | ICD-10-CM | POA: Diagnosis not present

## 2020-03-13 DIAGNOSIS — E039 Hypothyroidism, unspecified: Secondary | ICD-10-CM

## 2020-03-13 DIAGNOSIS — I6523 Occlusion and stenosis of bilateral carotid arteries: Secondary | ICD-10-CM

## 2020-03-13 DIAGNOSIS — Z01818 Encounter for other preprocedural examination: Secondary | ICD-10-CM | POA: Diagnosis not present

## 2020-03-13 DIAGNOSIS — E785 Hyperlipidemia, unspecified: Secondary | ICD-10-CM

## 2020-03-13 DIAGNOSIS — E11319 Type 2 diabetes mellitus with unspecified diabetic retinopathy without macular edema: Secondary | ICD-10-CM

## 2020-03-13 DIAGNOSIS — I10 Essential (primary) hypertension: Secondary | ICD-10-CM | POA: Diagnosis not present

## 2020-03-13 DIAGNOSIS — Z23 Encounter for immunization: Secondary | ICD-10-CM | POA: Diagnosis not present

## 2020-03-13 DIAGNOSIS — Z8673 Personal history of transient ischemic attack (TIA), and cerebral infarction without residual deficits: Secondary | ICD-10-CM

## 2020-03-13 DIAGNOSIS — Z9861 Coronary angioplasty status: Secondary | ICD-10-CM

## 2020-03-13 DIAGNOSIS — Z794 Long term (current) use of insulin: Secondary | ICD-10-CM

## 2020-03-13 DIAGNOSIS — J449 Chronic obstructive pulmonary disease, unspecified: Secondary | ICD-10-CM

## 2020-03-13 LAB — COMPREHENSIVE METABOLIC PANEL
ALT: 11 U/L (ref 0–35)
AST: 12 U/L (ref 0–37)
Albumin: 3 g/dL — ABNORMAL LOW (ref 3.5–5.2)
Alkaline Phosphatase: 81 U/L (ref 39–117)
BUN: 25 mg/dL — ABNORMAL HIGH (ref 6–23)
CO2: 31 mEq/L (ref 19–32)
Calcium: 8.7 mg/dL (ref 8.4–10.5)
Chloride: 106 mEq/L (ref 96–112)
Creatinine, Ser: 1.38 mg/dL — ABNORMAL HIGH (ref 0.40–1.20)
GFR: 38.39 mL/min — ABNORMAL LOW (ref >60.00)
Glucose, Bld: 40 mg/dL — CL (ref 70–99)
Potassium: 4.1 mEq/L (ref 3.5–5.1)
Sodium: 141 mEq/L (ref 135–145)
Total Bilirubin: 0.3 mg/dL (ref 0.2–1.2)
Total Protein: 6.2 g/dL (ref 6.0–8.3)

## 2020-03-13 LAB — LIPID PANEL
Cholesterol: 151 mg/dL (ref 0–200)
HDL: 48.8 mg/dL (ref >39.00)
LDL Cholesterol: 83 mg/dL (ref 0–99)
NonHDL: 102.31
Total CHOL/HDL Ratio: 3
Triglycerides: 96 mg/dL (ref 0.0–149.0)
VLDL: 19.2 mg/dL (ref 0.0–40.0)

## 2020-03-13 NOTE — Patient Instructions (Addendum)
Make an appt. With your cardiology clinic for surgical clearance  Labs today  Vital signs are stable  Make sure to drink enough water

## 2020-03-13 NOTE — Telephone Encounter (Signed)
Please call her to see if she is ok/if she has eaten  She has an insulin pump  Thanks

## 2020-03-13 NOTE — Progress Notes (Signed)
Subjective:    Patient ID: Jennifer [name], female    DOB: [birth year], 64 y.o.   MRN: [id number]  This visit occurred during the SARS-CoV-2 public health emergency.  Safety protocols were in place, including screening questions prior to the visit, additional usage of staff PPE, and extensive cleaning of exam room while observing appropriate contact time as indicated for disinfecting solutions.    HPI Pt presents for a surgical clearance visit   Wt Readings from Last 3 Encounters:  03/13/20 282 lb (127.9 kg)  02/06/20 282 lb 8 oz (128.1 kg)  02/05/20 260 lb (117.9 kg)   44.17 kg/m  Morbid obesity   Not doing a lot / foot hurts  Wants to get up and start walking more   Last surgery was a knee replacement  (did well with)  No known drug allergies  Takes low dose asa  Planning a removal of bursal cyst of L foot as well as exostectomy of L foot (5th metatarsal) Will be at [place] planned 04/08/20 Dr Posey Pronto at [facility]  ? What type of anesthesia  No known reaction to anesthesia   She has h/o CAD, hyperlipidemia and HTN  Also CVA in the past  Type 2 DM and hypothyroidism  Will need clearance from cardiology  HTN bp is stable today  No cp or palpitations or headaches or edema  No side effects to medicines  BP Readings from Last 3 Encounters:  03/13/20 128/76  02/06/20 140/68  02/05/20 (!) 150/76     Pulse Readings from Last 3 Encounters:  03/13/20 72  02/06/20 67  02/05/20 81   Taking lasix, amlodipine, carvedilol  H/o CAD with angioplasty and NSTEMI in 2017  Sees cardiology  Watching carotid artery dz also, with past CVA  Former smoker  Quit 4/20  Breathing/copd -no problems at all   Hypothyroid Managed by endocrinology -Dr Chalmers Cater  Next f/u is early November  No changes per pt    DM2-with retinopathy and neuropathy Managed by endocrinology  januvia Insulin pump  Her last a1c went up to a 7  Blood sugars have been better at  home (no hypoglycemia)  Generally 130s   (170 at highest)  Is watching her diet well - more veggies and cutting out bread     Hyperlipidemia Lab Results  Component Value Date   CHOL 173 10/10/2018   HDL 37 (L) 10/10/2018   LDLCALC 108 (H) 10/10/2018   LDLDIRECT 154.2 11/04/2011   TRIG 140 10/10/2018   CHOLHDL 4.7 10/10/2018   Taking a statin  Pt states she gets her cholesterol checked at another office   Lab Results  Component Value Date   CREATININE 1.47 (H) 02/05/2020   BUN 23 02/05/2020   NA 134 (L) 02/05/2020   K 3.6 02/05/2020   CL 99 02/05/2020   CO2 25 02/05/2020   Lab Results  Component Value Date   ALT 12 10/16/2019   AST 14 10/16/2019   ALKPHOS 78 10/16/2019   BILITOT 0.3 10/16/2019    She is trying to drink more water   No cardiac symptoms   Patient Active Problem List   Diagnosis Date Noted  . Pre-op examination 03/13/2020  . Abscess of foot 02/06/2020  . Chronic venous insufficiency 02/06/2020  . UTI (urinary tract infection) 05/28/2019  . Nausea & vomiting 05/28/2019  . Constipation 05/28/2019  . S/P TKR (total knee replacement), right 03/23/2019 05/07/2019  . Primary osteoarthritis of right knee 04/02/2019  .  Unilateral primary osteoarthritis, right knee   . Pedal edema 02/25/2019  . Acute vertigo with vomiting and inability to stand 10/08/2018  . Type 2 diabetes mellitus ([place]) 10/08/2018  . Former smoker 10/08/2018  . Carotid artery disease ([place]) 03/15/2018  . History of CVA (cerebrovascular accident) 11/16/2017  . Morbid obesity ([place]) 11/09/2017  . Low back pain 10/26/2017  . Screening mammogram, encounter for 09/18/2017  . CAD S/P percutaneous coronary angioplasty 11/04/2015  . NSTEMI (non-ST elevated myocardial infarction) ([place]) 11/03/2015  . Hypothyroidism 07/06/2010  . Hyperlipidemia associated with type 2 diabetes mellitus ([place]) 08/28/2008  . Essential hypertension 11/13/2007  . 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 Diabetic peripheral neuropathy ([place]) 09/21/2006  . Adjustment disorder with mixed anxiety and depressed mood 09/21/2006  . CARPAL TUNNEL SYNDROME 09/21/2006  . COPD (chronic obstructive pulmonary disease) ([place]) 09/21/2006  . EDEMA 09/21/2006  . MIGRAINES, HX OF 09/21/2006   Past Medical History:  Diagnosis Date  . CAD (coronary artery disease)    cath 5/23 100% dist RCA lesion treated with 2 overlapping Integrity Resolute DES ( 2.25x46mm, 2.25x58mm), 60% mid RCA treated medically, 99% OM2 not amenable to PCI, 70% D1 lesion, EF normal  . Hypercholesteremia   . Hypertension   . Hypothyroidism   . Stroke ([place]) 10/2018  . Thyroid disease   . Type 2 diabetes mellitus ([place]) 10/08/2018   Past Surgical History:  Procedure Laterality Date  . ABDOMINAL HYSTERECTOMY    . CARDIAC CATHETERIZATION N/A 11/03/2015   Procedure: Left Heart Cath and Coronary Angiography;  Surgeon: Leonie Man, MD;  Location: [place] [facility];  Service: Cardiovascular;  Laterality: N/A;  . CARDIAC CATHETERIZATION N/A 11/03/2015   Procedure: Coronary Stent Intervention;  Surgeon: Leonie Man, MD;  Location: [place] [facility];  Service: Cardiovascular;  Laterality: N/A;  . CARPAL TUNNEL RELEASE    . CORONARY ANGIOPLASTY     1 stent  . TOTAL KNEE ARTHROPLASTY Right 04/02/2019   Procedure: TOTAL KNEE ARTHROPLASTY;  Surgeon: Carole Civil, MD;  Location: [facility];  Service: Orthopedics;  Laterality: Right;   Social History   Tobacco Use  . Smoking status: Former Smoker    Packs/day: 0.25    Types: Cigarettes    Quit date: 10/07/2018    Years since quitting: 1.4  . Smokeless tobacco: Never Used  Substance Use Topics  . Alcohol use: No    Alcohol/week: 0.0 standard drinks  . Drug use: No   Family History  Problem Relation Age of Onset  . Heart disease Mother   . Stroke Sister   . Heart attack Brother    No  Known Allergies Current Outpatient Medications on File Prior to Visit  Medication Sig Dispense Refill  . acetaminophen (TYLENOL) 325 MG tablet Take 2 tablets (650 mg total) by mouth every 4 (four) hours as needed for mild pain, fever or headache. 20 tablet 2  . amLODipine (NORVASC) 10 MG tablet Take 1 tablet (10 mg total) by mouth daily. 30 tablet 5  . aspirin EC 81 MG tablet Take 1 tablet (81 mg total) by mouth 2 (two) times daily. Hold aspirin while you are on apixaban/Eliquis 1 tablet 0  . atorvastatin (LIPITOR) 80 MG tablet TAKE 1 TABLET BY MOUTH ONCE DAILY AT  6  PM 90 tablet 2  . buPROPion (WELLBUTRIN SR) 150 MG 12 hr tablet Take 1 tablet by  mouth twice daily 180 tablet 0  . carvedilol (COREG) 12.5 MG tablet Take 0.5 tablets (6.25 mg total) by mouth 2 (two) times daily with a meal. 30 tablet 3  . DULoxetine (CYMBALTA) 60 MG capsule Take 1 capsule by mouth once daily 90 capsule 1  . furosemide (LASIX) 40 MG tablet Take 1 tablet (40 mg total) by mouth 2 (two) times daily. 8AM, 1 PM 60 tablet 3  . Insulin Human (INSULIN PUMP) SOLN Inject into the skin continuous. Patient may bolus up to 40 units at a time, with a maximum daily limit of 120 units    . levothyroxine (SYNTHROID) 137 MCG tablet Take 137 mcg by mouth daily before breakfast.    . nitroGLYCERIN (NITROSTAT) 0.4 MG SL tablet Place 1 tablet (0.4 mg total) under the tongue every 5 (five) minutes x 3 doses as needed for chest pain. 25 tablet 3  . sitaGLIPtin (JANUVIA) 100 MG tablet Take 100 mg by mouth daily.      No current facility-administered medications on file prior to visit.    Review of Systems  Constitutional: Positive for fatigue. Negative for activity change, appetite change, fever and unexpected weight change.  HENT: Negative for congestion, ear pain, rhinorrhea, sinus pressure and sore throat.   Eyes: Negative for pain, redness and visual disturbance.  Respiratory: Negative for cough, shortness of breath and wheezing.     Cardiovascular: Negative for chest pain and palpitations.  Gastrointestinal: Negative for abdominal pain, blood in stool, constipation and diarrhea.  Endocrine: Negative for polydipsia and polyuria.  Genitourinary: Negative for dysuria, frequency and urgency.  Musculoskeletal: Positive for arthralgias. Negative for back pain and myalgias.       L foot pain  Cyst on lateral L foot   Skin: Negative for pallor and rash.  Allergic/Immunologic: Negative for environmental allergies.  Neurological: Negative for dizziness, syncope and headaches.  Hematological: Negative for adenopathy. Does not bruise/bleed easily.  Psychiatric/Behavioral: Negative for decreased concentration and dysphoric mood. The patient is not nervous/anxious.        Objective:   Physical Exam Constitutional:      General: She is not in acute distress.    Appearance: Normal appearance. She is well-developed. She is obese. She is not ill-appearing or diaphoretic.  HENT:     Head: Normocephalic and atraumatic.  Eyes:     General: No scleral icterus.    Conjunctiva/sclera: Conjunctivae normal.     Pupils: Pupils are equal, round, and reactive to light.  Neck:     Thyroid: No thyromegaly.     Vascular: No carotid bruit or JVD.  Cardiovascular:     Rate and Rhythm: Normal rate and regular rhythm.     Pulses: Normal pulses.     Heart sounds: Normal heart sounds. No gallop.      Comments: LE varicosities  Pulmonary:     Effort: Pulmonary effort is normal. No respiratory distress.     Breath sounds: Normal breath sounds. No wheezing or rales.     Comments: Scant exp wheeze (anteriorly) cleared after cough  Distant bs   Abdominal:     General: Bowel sounds are normal. There is no distension or abdominal bruit.     Palpations: Abdomen is soft. There is no mass.     Tenderness: There is no abdominal tenderness.     Hernia: No hernia is present.  Musculoskeletal:     Cervical back: Normal range of motion and neck  supple.     Right lower  leg: No edema.     Left lower leg: No edema.     Comments: Large cyst noted on lateral left foot that is tender (no redness or warmth or drainage)    Lymphadenopathy:     Cervical: No cervical adenopathy.  Skin:    General: Skin is warm and dry.     Coloration: Skin is not pale.     Findings: No erythema or rash.  Neurological:     Mental Status: She is alert.     Cranial Nerves: No cranial nerve deficit.     Coordination: Coordination normal.     Deep Tendon Reflexes: Reflexes are normal and symmetric. Reflexes normal.     Comments: No focal weakness Favors RLE for gait due to pain in L  Psychiatric:        Mood and Affect: Mood normal.        Cognition and Memory: Cognition and memory normal.           Assessment & Plan:   Problem List Items Addressed This Visit      Cardiovascular and Mediastinum   Essential hypertension (Chronic)    bp in fair control at this time  BP Readings from Last 1 Encounters:  03/13/20 128/76   No changes needed Most recent labs reviewed  Disc lifstyle change with low sodium diet and exercise  Taking amlodipine and carvedilol and lasix  Followed by cardiology  Lab today      Relevant Orders   Comprehensive metabolic panel (Completed)   CAD S/P percutaneous coronary angioplasty    No re occurrence  Followed by cardiology  On statin  Controlling bp and DM2  Will need cardiac clearance for ucoming foot surgery      Carotid artery disease ([place])    No clinical changes Taking statin and asa        Respiratory   COPD (chronic obstructive pulmonary disease) ([place]) (Chronic)    Quit smoking in 2020  No clinical changes  Not needing inhalers or mt medicine        Endocrine   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, with long-term current use of insulin ([place])    Followed by endocrinology On januvia and has an insulin pump  Has been fairly well controlled    Eating is improved as well      Hyperlipidemia associated with type 2 diabetes mellitus ([place])    Disc goals for lipids and reasons to control them Rev last labs with pt Rev low sat fat diet in detail  On statin with hx of CAD and CVA  Labs today      Relevant Orders   Lipid panel (Completed)   Hypothyroidism    Followed by endocrinology  Per pt no clinical changes        Other   Morbid obesity ([place])    Discussed how this problem influences overall health and the risks it imposes  Reviewed plan for weight loss with lower calorie diet (via better food choices and also portion control or program like weight watchers) and exercise building up to or more than 30 minutes 5 days per week including some aerobic activity   Pt wants to walk more but is limited by a cyst on her L foot  Planning surgery later this month      History of CVA (cerebrovascular accident)    No residual deficits No new symptoms Taking statin and asa  Followed by  cardiology and neurology  bp is well controlled      Pre-op examination - Primary    Foot surgery planned 04/08/20 at [facility] for removal of L foot bursal cyst and exostectomy of 5th metatarsal   (symptomatic and limping)  Pt has complex med hx of CAD (stemi in past) , CVA, HTN , DM2 and mobid obesity  All conditions are fairly well controlled No drug allergies or adv anesthesia occurrences in past Did well with knee replacement about a year ago  Labs today  Needs clearance from cardiology  Will need to hold asa 81 mg per cardiol pref before surgery          Other Visit Diagnoses    Need for influenza vaccination       Relevant Orders   Flu Vaccine QUAD 6+ mos PF IM (Fluarix Quad PF) (Completed)

## 2020-03-13 NOTE — Telephone Encounter (Signed)
Glucose 40

## 2020-03-13 NOTE — Telephone Encounter (Signed)
Pt said she could tell her glucose was dropping on the way out of the office because she had not eaten any breakfast. Pt said the front desk gave her crackers and water and she ate as soon as she got home and her BS is back normal and she feels fine now

## 2020-03-14 NOTE — Assessment & Plan Note (Signed)
Followed by endocrinology On januvia and has an insulin pump  Has been fairly well controlled  Eating is improved as well

## 2020-03-14 NOTE — Assessment & Plan Note (Signed)
No clinical changes Taking statin and asa

## 2020-03-14 NOTE — Assessment & Plan Note (Signed)
Foot surgery planned 04/08/20 at [facility] for removal of L foot bursal cyst and exostectomy of 5th metatarsal   (symptomatic and limping)  Pt has complex med hx of CAD (stemi in past) , CVA, HTN , DM2 and mobid obesity  All conditions are fairly well controlled No drug allergies or adv anesthesia occurrences in past Did well with knee replacement about a year ago  Labs today  Needs clearance from cardiology  Will need to hold asa 81 mg per cardiol pref before surgery

## 2020-03-14 NOTE — Assessment & Plan Note (Signed)
Followed by endocrinology  Per pt no clinical changes

## 2020-03-14 NOTE — Assessment & Plan Note (Signed)
No residual deficits No new symptoms Taking statin and asa  Followed by cardiology and neurology  bp is well controlled

## 2020-03-14 NOTE — Assessment & Plan Note (Signed)
Disc goals for lipids and reasons to control them Rev last labs with pt Rev low sat fat diet in detail  On statin with hx of CAD and CVA  Labs today

## 2020-03-14 NOTE — Assessment & Plan Note (Signed)
Quit smoking in 2020  No clinical changes  Not needing inhalers or mt medicine

## 2020-03-14 NOTE — Assessment & Plan Note (Signed)
No re occurrence  Followed by cardiology  On statin  Controlling bp and DM2  Will need cardiac clearance for ucoming foot surgery

## 2020-03-14 NOTE — Assessment & Plan Note (Signed)
Discussed how this problem influences overall health and the risks it imposes  Reviewed plan for weight loss with lower calorie diet (via better food choices and also portion control or program like weight watchers) and exercise building up to or more than 30 minutes 5 days per week including some aerobic activity   Pt wants to walk more but is limited by a cyst on her L foot  Planning surgery later this month

## 2020-03-14 NOTE — Assessment & Plan Note (Signed)
bp in fair control at this time  BP Readings from Last 1 Encounters:  03/13/20 128/76   No changes needed Most recent labs reviewed  Disc lifstyle change with low sodium diet and exercise  Taking amlodipine and carvedilol and lasix  Followed by cardiology  Lab today

## 2020-03-16 ENCOUNTER — Encounter: Payer: Self-pay | Admitting: Family Medicine

## 2020-03-25 ENCOUNTER — Other Ambulatory Visit: Payer: Self-pay | Admitting: Student

## 2020-03-29 NOTE — Progress Notes (Deleted)
Cardiology Office Note  Date: 03/29/2020   ID: Jennifer [name], DOB 26-Aug-1955, MRN [id number]  PCP:  Abner Greenspan, MD  Cardiologist:  Kate Sable, MD (Inactive) Electrophysiologist:  None   Chief Complaint: Preop cardiac clearance  History of Present Illness: Jennifer [name] is a 64 y.o. female with a history of CAD (status post PCI to dRCA, mRCA 60%, D1 70%, OM3 99% (none amenable to PCI) 2/2 NSTEMI) , HLD, HTN, CVA, hypothyroidism, DM 2.  Last seen April 01, 2019 with Dr. Eleonore Chiquito cardiology for preop cardiovascular examination.  She reported no symptoms of angina or shortness of breath.  Had considerable lower extremity edema attributed to her poor mobility and poor venous circulation.  She was taking Lasix daily.  Stated she could walk all day and had no symptoms of angina, or shortness of breath.  She was pending knee replacement.  Past Medical History:  Diagnosis Date  . CAD (coronary artery disease)    cath 5/23 100% dist RCA lesion treated with 2 overlapping Integrity Resolute DES ( 2.25x8mm, 2.25x42mm), 60% mid RCA treated medically, 99% OM2 not amenable to PCI, 70% D1 lesion, EF normal  . Hypercholesteremia   . Hypertension   . Hypothyroidism   . Stroke ([place]) 10/2018  . Thyroid disease   . Type 2 diabetes mellitus ([place]) 10/08/2018    Past Surgical History:  Procedure Laterality Date  . ABDOMINAL HYSTERECTOMY    . CARDIAC CATHETERIZATION N/A 11/03/2015   Procedure: Left Heart Cath and Coronary Angiography;  Surgeon: Leonie Man, MD;  Location: [place] [facility];  Service: Cardiovascular;  Laterality: N/A;  . CARDIAC CATHETERIZATION N/A 11/03/2015   Procedure: Coronary Stent Intervention;  Surgeon: Leonie Man, MD;  Location: [place] [facility];  Service: Cardiovascular;  Laterality: N/A;  . CARPAL TUNNEL RELEASE    . CORONARY ANGIOPLASTY     1 stent  . TOTAL KNEE ARTHROPLASTY Right 04/02/2019   Procedure: TOTAL KNEE ARTHROPLASTY;  Surgeon:  Carole Civil, MD;  Location: [facility];  Service: Orthopedics;  Laterality: Right;    Current Outpatient Medications  Medication Sig Dispense Refill  . acetaminophen (TYLENOL) 325 MG tablet Take 2 tablets (650 mg total) by mouth every 4 (four) hours as needed for mild pain, fever or headache. 20 tablet 2  . amLODipine (NORVASC) 10 MG tablet Take 1 tablet (10 mg total) by mouth daily. 30 tablet 5  . aspirin EC 81 MG tablet Take 1 tablet (81 mg total) by mouth 2 (two) times daily. Hold aspirin while you are on apixaban/Eliquis 1 tablet 0  . atorvastatin (LIPITOR) 80 MG tablet TAKE 1 TABLET BY MOUTH ONCE DAILY AT  6  PM 90 tablet 2  . buPROPion (WELLBUTRIN SR) 150 MG 12 hr tablet Take 1 tablet by mouth twice daily 180 tablet 0  . carvedilol (COREG) 12.5 MG tablet TAKE 1 TABLET BY MOUTH TWICE DAILY WITH A MEAL 60 tablet 6  . DULoxetine (CYMBALTA) 60 MG capsule Take 1 capsule by mouth once daily 90 capsule 1  . furosemide (LASIX) 40 MG tablet Take 1 tablet (40 mg total) by mouth 2 (two) times daily. 8AM, 1 PM 60 tablet 3  . Insulin Human (INSULIN PUMP) SOLN Inject into the skin continuous. Patient may bolus up to 40 units at a time, with a maximum daily limit of 120 units    . levothyroxine (SYNTHROID) 137 MCG tablet Take 137 mcg by mouth daily before breakfast.    .  nitroGLYCERIN (NITROSTAT) 0.4 MG SL tablet Place 1 tablet (0.4 mg total) under the tongue every 5 (five) minutes x 3 doses as needed for chest pain. 25 tablet 3  . sitaGLIPtin (JANUVIA) 100 MG tablet Take 100 mg by mouth daily.      No current facility-administered medications for this visit.   Allergies:  Patient has no known allergies.   Social History: The patient  reports that she quit smoking about 17 months ago. Her smoking use included cigarettes. She smoked 0.25 packs per day. She has never used smokeless tobacco. She reports that she does not drink alcohol and does not use drugs.   Family History: The patient's family  history includes Heart attack in her brother; Heart disease in her mother; Stroke in her sister.   ROS:  Please see the history of present illness. Otherwise, complete review of systems is positive for {NONE DEFAULTED:18576::"none"}.  All other systems are reviewed and negative.   Physical Exam: VS:  There were no vitals taken for this visit., BMI There is no height or weight on file to calculate BMI.  Wt Readings from Last 3 Encounters:  03/13/20 282 lb (127.9 kg)  02/06/20 282 lb 8 oz (128.1 kg)  02/05/20 260 lb (117.9 kg)    General: Patient appears comfortable at rest. HEENT: Conjunctiva and lids normal, oropharynx clear with moist mucosa. Neck: Supple, no elevated JVP or carotid bruits, no thyromegaly. Lungs: Clear to auscultation, nonlabored breathing at rest. Cardiac: Regular rate and rhythm, no S3 or significant systolic murmur, no pericardial rub. Abdomen: Soft, nontender, no hepatomegaly, bowel sounds present, no guarding or rebound. Extremities: No pitting edema, distal pulses 2+. Skin: Warm and dry. Musculoskeletal: No kyphosis. Neuropsychiatric: Alert and oriented x3, affect grossly appropriate.  ECG:  {EKG/Telemetry Strips Reviewed:[id number]}  Recent Labwork: 02/05/2020: Hemoglobin 11.3; Platelets 229 03/13/2020: ALT 11; AST 12; BUN 25; Creatinine, Ser 1.38; Potassium 4.1; Sodium 141     Component Value Date/Time   CHOL 151 03/13/2020 1234   TRIG 96.0 03/13/2020 1234   HDL 48.80 03/13/2020 1234   CHOLHDL 3 03/13/2020 1234   VLDL 19.2 03/13/2020 1234   LDLCALC 83 03/13/2020 1234   LDLDIRECT 154.2 11/04/2011 1018    Other Studies Reviewed Today:  TTE 10/10/2018 1. The left ventricle has normal systolic function, with an ejection fraction of 55-60%. The cavity size was normal. Left ventricular diastolic Doppler parameters are consistent with impaired relaxation. No evidence of left ventricular regional wall  motion abnormalities. 2. Moderate hypokinesis of the  left ventricular, basal inferior wall. 3. The right ventricle has normal systolic function. The cavity was normal. There is no increase in right ventricular wall thickness. Right ventricular systolic pressure normal with an estimated pressure of 32.0 mmHg. 4. The aortic valve is tricuspid. Mild aortic annular calcification noted. 5. The mitral valve is grossly normal. There is mild mitral annular calcification present. 6. The tricuspid valve is grossly normal. 7. The aortic root is normal in size and structure. 8. There is aneurysmal motion of the interatrial septum with evidence of PFO by agitated saline injection, right to left shunting seen intermittently.  LHC 11/04/2015  Dist RCA lesion, 100% stenosed. Post intervention - PTCA followed by PCI with 2 overlapping Integrity Resolute DES Stents (2.25 mm x 14 mm, 2.25 mm x 12 mm), there is a 0% residual stenosis.  Mid RCA lesion, 60% stenosed. Not flow-limiting. The decision was made to treat this medically.  2nd Mrg lesion, 99% stenosed. Prior to visualization  of the distal RCA occlusion, this was thought to be a potential culprit, however not PCI amenable.  Lat 1st Mrg lesion, 55% stenosed.  1st Diag lesion, 70% stenosed.  Preserved LVEF with normal LVEDP  The left ventricular systolic function is normal.   Assessment and Plan:  1. Preoperative clearance   2. CAD in native artery   3. Essential hypertension   4. Mixed hyperlipidemia   5. Obesity (BMI 30-39.9)    1. Preoperative clearance ***  2. CAD in native artery ***  3. Essential hypertension ***  4. Mixed hyperlipidemia ***  5. Obesity (BMI 30-39.9) ***  Medication Adjustments/Labs and Tests Ordered: Current medicines are reviewed at length with the patient today.  Concerns regarding medicines are outlined above.   Disposition: Follow-up with ***  Signed, Levell July, NP 03/29/2020 9:48 PM    [facility] at [facility] [place], [place], [place] [address] Phone: [phone]; Fax: [phone]

## 2020-03-30 ENCOUNTER — Ambulatory Visit: Payer: 59 | Admitting: Family Medicine

## 2020-03-30 DIAGNOSIS — Z01818 Encounter for other preprocedural examination: Secondary | ICD-10-CM

## 2020-03-30 DIAGNOSIS — I2510 Atherosclerotic heart disease of native coronary artery without angina pectoris: Secondary | ICD-10-CM

## 2020-03-30 DIAGNOSIS — I10 Essential (primary) hypertension: Secondary | ICD-10-CM

## 2020-03-30 DIAGNOSIS — E669 Obesity, unspecified: Secondary | ICD-10-CM

## 2020-03-30 DIAGNOSIS — E782 Mixed hyperlipidemia: Secondary | ICD-10-CM

## 2020-04-01 ENCOUNTER — Ambulatory Visit: Payer: 59 | Admitting: Orthopedic Surgery

## 2020-04-01 DIAGNOSIS — Z96651 Presence of right artificial knee joint: Secondary | ICD-10-CM

## 2020-04-03 ENCOUNTER — Ambulatory Visit: Payer: 59 | Admitting: Orthopedic Surgery

## 2020-04-06 ENCOUNTER — Other Ambulatory Visit (HOSPITAL_COMMUNITY): Admission: RE | Admit: 2020-04-06 | Payer: 59 | Source: Ambulatory Visit

## 2020-04-06 ENCOUNTER — Encounter (HOSPITAL_COMMUNITY): Payer: 59

## 2020-04-07 ENCOUNTER — Ambulatory Visit (INDEPENDENT_AMBULATORY_CARE_PROVIDER_SITE_OTHER): Payer: 59 | Admitting: Cardiology

## 2020-04-07 DIAGNOSIS — I2510 Atherosclerotic heart disease of native coronary artery without angina pectoris: Secondary | ICD-10-CM

## 2020-04-07 DIAGNOSIS — Z9861 Coronary angioplasty status: Secondary | ICD-10-CM

## 2020-04-07 NOTE — Progress Notes (Signed)
No show  Daena Alper PA-C 04/07/2020 1:12 PM

## 2020-04-08 ENCOUNTER — Ambulatory Visit: Admit: 2020-04-08 | Payer: 59

## 2020-04-08 SURGERY — EXCISION, GANGLION CYST, FOOT
Anesthesia: Monitor Anesthesia Care | Site: Foot | Laterality: Left

## 2020-04-16 ENCOUNTER — Ambulatory Visit (INDEPENDENT_AMBULATORY_CARE_PROVIDER_SITE_OTHER): Payer: 59 | Admitting: Ophthalmology

## 2020-04-16 ENCOUNTER — Encounter (INDEPENDENT_AMBULATORY_CARE_PROVIDER_SITE_OTHER): Payer: Self-pay | Admitting: Ophthalmology

## 2020-04-16 ENCOUNTER — Other Ambulatory Visit: Payer: Self-pay

## 2020-04-16 DIAGNOSIS — H4312 Vitreous hemorrhage, left eye: Secondary | ICD-10-CM

## 2020-04-16 DIAGNOSIS — E113511 Type 2 diabetes mellitus with proliferative diabetic retinopathy with macular edema, right eye: Secondary | ICD-10-CM

## 2020-04-16 DIAGNOSIS — I6523 Occlusion and stenosis of bilateral carotid arteries: Secondary | ICD-10-CM

## 2020-04-16 DIAGNOSIS — H35371 Puckering of macula, right eye: Secondary | ICD-10-CM | POA: Diagnosis not present

## 2020-04-16 DIAGNOSIS — E113512 Type 2 diabetes mellitus with proliferative diabetic retinopathy with macular edema, left eye: Secondary | ICD-10-CM

## 2020-04-16 MED ORDER — BEVACIZUMAB CHEMO INJECTION 1.25MG/0.05ML SYRINGE FOR KALEIDOSCOPE
1.2500 mg | INTRAVITREAL | Status: AC | PRN
  Administered 2020-04-16: 1.25 mg via INTRAVITREAL

## 2020-04-16 NOTE — Assessment & Plan Note (Signed)
Mild and some component of the blurred vision, will treat the macular edema component left eye and this should help with clearance of the vitreous hemorrhage in the future left eye

## 2020-04-16 NOTE — Assessment & Plan Note (Signed)
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

## 2020-04-16 NOTE — Assessment & Plan Note (Signed)
CSME OD, some center involvement but more aggressive CSME inferonasal to the fovea.  Will need to commence antivegF to allow for potential vision visual acuity stabilization and possible improvement

## 2020-04-16 NOTE — Assessment & Plan Note (Addendum)
May need to commence with intravitreal therapy soon OS

## 2020-04-16 NOTE — Progress Notes (Signed)
04/16/2020     CHIEF COMPLAINT Patient presents for Retina Evaluation   HISTORY OF PRESENT ILLNESS: Jennifer [name] is a 64 y.o. female who presents to the clinic today for:   HPI    Retina Evaluation    In left eye.  This started 2 weeks ago.  Duration of 2 weeks.  Context:  distance vision, mid-range vision and near vision.  Treatments tried include no treatments.          Comments    Refer-back DME OU (pt last seen 12/2018) - Ref'd by Dr. Wyatt Portela  Pt c/o "fuzzy" OS x 2 weeks. OD VA stable. Pt reports floaters OS off and on. No flashes or ocular pain. A1c: 7.0, 10/2019 LBS: 122 this AM       Last edited by Rockie Neighbours, [place] on 04/16/2020 10:13 AM. (History)      Referring physician: [name], [name], MD [place],  [place] [address]  HISTORICAL INFORMATION:   Selected notes from the MEDICAL RECORD NUMBER    Lab Results  Component Value Date   HGBA1C 6.5 (H) 03/29/2019     CURRENT MEDICATIONS: No current outpatient medications on file. (Ophthalmic Drugs)   No current facility-administered medications for this visit. (Ophthalmic Drugs)   Current Outpatient Medications (Other)  Medication Sig  . amLODipine (NORVASC) 10 MG tablet Take 1 tablet (10 mg total) by mouth daily.  Marland Kitchen aspirin EC 81 MG tablet Take 1 tablet (81 mg total) by mouth 2 (two) times daily. Hold aspirin while you are on apixaban/Eliquis  . atorvastatin (LIPITOR) 80 MG tablet TAKE 1 TABLET BY MOUTH ONCE DAILY AT  6  PM  . buPROPion (WELLBUTRIN SR) 150 MG 12 hr tablet Take 1 tablet by mouth twice daily  . carvedilol (COREG) 12.5 MG tablet TAKE 1 TABLET BY MOUTH TWICE DAILY WITH A MEAL  . DULoxetine (CYMBALTA) 60 MG capsule Take 1 capsule by mouth once daily  . furosemide (LASIX) 40 MG tablet Take 1 tablet (40 mg total) by mouth 2 (two) times daily. 8AM, 1 PM  . Insulin Human (INSULIN PUMP) SOLN Inject into the skin continuous. Patient may bolus up to 40 units at a time, with a  maximum daily limit of 120 units  . levothyroxine (SYNTHROID) 137 MCG tablet Take 137 mcg by mouth daily before breakfast.  . nitroGLYCERIN (NITROSTAT) 0.4 MG SL tablet Place 1 tablet (0.4 mg total) under the tongue every 5 (five) minutes x 3 doses as needed for chest pain.  . sitaGLIPtin (JANUVIA) 100 MG tablet Take 100 mg by mouth daily.    No current facility-administered medications for this visit. (Other)      REVIEW OF SYSTEMS:    ALLERGIES No Known Allergies  PAST MEDICAL HISTORY Past Medical History:  Diagnosis Date  . CAD (coronary artery disease)    cath 5/23 100% dist RCA lesion treated with 2 overlapping Integrity Resolute DES ( 2.25x41mm, 2.25x66mm), 60% mid RCA treated medically, 99% OM2 not amenable to PCI, 70% D1 lesion, EF normal  . Hypercholesteremia   . Hypertension   . Hypothyroidism   . Stroke ([place]) 10/2018  . Thyroid disease   . Type 2 diabetes mellitus ([place]) 10/08/2018   Past Surgical History:  Procedure Laterality Date  . ABDOMINAL HYSTERECTOMY    . CARDIAC CATHETERIZATION N/A 11/03/2015   Procedure: Left Heart Cath and Coronary Angiography;  Surgeon: Leonie Man, MD;  Location: [facility];  Service:  Cardiovascular;  Laterality: N/A;  . CARDIAC CATHETERIZATION N/A 11/03/2015   Procedure: Coronary Stent Intervention;  Surgeon: Leonie Man, MD;  Location: [facility];  Service: Cardiovascular;  Laterality: N/A;  . CARPAL TUNNEL RELEASE    . CORONARY ANGIOPLASTY     1 stent  . TOTAL KNEE ARTHROPLASTY Right 04/02/2019   Procedure: TOTAL KNEE ARTHROPLASTY;  Surgeon: Carole Civil, MD;  Location: [facility];  Service: Orthopedics;  Laterality: Right;    FAMILY HISTORY Family History  Problem Relation Age of Onset  . Heart disease Mother   . Stroke Sister   . Heart attack Brother     SOCIAL HISTORY Social History   Tobacco Use  . Smoking status: Former Smoker    Packs/day: 0.25    Types: Cigarettes    Quit date:  10/07/2018    Years since quitting: 1.5  . Smokeless tobacco: Never Used  Substance Use Topics  . Alcohol use: No    Alcohol/week: 0.0 standard drinks  . Drug use: No         OPHTHALMIC EXAM:  Base Eye Exam    Visual Acuity (ETDRS)      Right Left   Dist cc 20/70 +2 CF @ 3'   Dist ph cc NI NI   Correction: Glasses       Tonometry (Tonopen, 10:02 AM)      Right Left   Pressure 08 07       Pupils      Pupils Dark Light Shape React APD   Right PERRL 5 4 Round Brisk None   Left PERRL 5 4 Round Brisk None       Visual Fields (Counting fingers)      Left Right    Full Full       Extraocular Movement      Right Left    Full Full       Neuro/Psych    Oriented x3: Yes   Mood/Affect: Normal       Dilation    Both eyes: 1.0% Mydriacyl, 2.5% Phenylephrine @ 10:06 AM        Slit Lamp and Fundus Exam    External Exam      Right Left   External Normal Normal       Slit Lamp Exam      Right Left   Lids/Lashes Normal Normal   Conjunctiva/Sclera White and quiet White and quiet   Cornea Clear Clear   Anterior Chamber Deep and quiet Deep and quiet   Iris Round and reactive Round and reactive   Lens Centered posterior chamber intraocular lens Centered posterior chamber intraocular lens   Anterior Vitreous Normal Normal,        Fundus Exam      Right Left   Posterior Vitreous Clear, vitrectomized Vitreous hemorrhage 1+, vitrectomized   Disc Normal Normal   C/D Ratio 0.3 0.2   Macula Epiretinal membrane, Cystoid macular edema Cystoid macular edema   Vessels PDR-quiet PDR-active   Periphery PRP 360,  attached PRP 360,  attached          IMAGING AND PROCEDURES  Imaging and Procedures for 04/16/20  OCT, Retina - OU - Both Eyes       Right Eye Quality was good. Scan locations included subfoveal. Central Foveal Thickness: 418. Progression has improved. Findings include abnormal foveal contour, epiretinal membrane, cystoid macular edema.   Left  Eye Quality was good. Scan locations included subfoveal. Central Foveal Thickness:  435. Progression has improved. Findings include abnormal foveal contour, cystoid macular edema.   Notes CME, center involved CSME OD, best chance of visual acuity stabilization and improvement, will commence with injection today.  OS similar findings will need injection soon       Color Fundus Photography Optos - OU - Both Eyes       Right Eye Progression has been stable. Macula : microaneurysms.   Left Eye Progression has been stable. Disc findings include normal observations. Macula : microaneurysms.   Notes Bilateral PDR, some room temporally OD for more PRP but still quiet,  Left eye mild vitreous debris, hemorrhage, yet good PRP 360       Intravitreal Injection, Pharmacologic Agent - OD - Right Eye       Time Out 04/16/2020. 11:25 AM. Confirmed correct patient, procedure, site, and patient consented.   Anesthesia Topical anesthesia was used. Anesthetic medications included Akten 3.5%.   Procedure Preparation included Ofloxacin , Tobramycin 0.3%, 10% betadine to eyelids, 5% betadine to ocular surface. A 30 gauge needle was used.   Injection:  1.25 mg Bevacizumab (AVASTIN) SOLN   NDC: 70360-001-02, Lot: [id number]   Route: Intravitreal, Site: Right Eye, Waste: 0 mg  Post-op Post injection exam found visual acuity of at least counting fingers. The patient tolerated the procedure well. There were no complications. The patient received written and verbal post procedure care education. Post injection medications were not given.                 ASSESSMENT/PLAN:  Diabetic macular edema of right eye with proliferative retinopathy associated with type 2 diabetes mellitus (HCC) CSME OD, some center involvement but more aggressive CSME inferonasal to the fovea.  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 Diabetic macular edema of right eye with proliferative retinopathy associated with type 2 diabetes mellitus (HCC)  E11.3511 OCT, Retina - OU - Both Eyes    Color Fundus Photography Optos - OU - Both Eyes    Intravitreal Injection, Pharmacologic Agent - OD - Right Eye    Bevacizumab (AVASTIN) SOLN 1.25 mg  2. Proliferative diabetic retinopathy of left eye with macular edema associated with type 2 diabetes mellitus (HCC)  X32.3557 Color Fundus Photography Optos - OU - Both Eyes  3. Macular pucker, right eye  H35.371 Color Fundus Photography Optos - OU - Both Eyes  4. Vitreous hemorrhage of left eye (HCC)  H43.12     1.  Commence therapy OD to calm CSME, injection intravitreal Avastin OD today  2.  Follow-up next week for intravitreal Avastin OS  3.  Ophthalmic Meds Ordered this visit:  Meds ordered this encounter  Medications  . Bevacizumab (AVASTIN) SOLN 1.25 mg       Return in about 1 week (around 04/23/2020) for dilate, OS, AVASTIN OCT.  There are no Patient Instructions on file for this visit.   Explained the diagnoses, plan, and follow up with the  patient and they expressed understanding.  Patient expressed understanding of the importance of proper follow up care.   Clent Demark Taliesin Hartlage M.D. Diseases & Surgery of the Retina and Vitreous Retina & Diabetic [place] 04/16/20     Abbreviations: M myopia (nearsighted); A astigmatism; H hyperopia (farsighted); P  presbyopia; Mrx spectacle prescription;  CTL contact lenses; OD right eye; OS left eye; OU both eyes  XT exotropia; ET esotropia; PEK punctate epithelial keratitis; PEE punctate epithelial erosions; DES dry eye syndrome; MGD meibomian gland dysfunction; ATs artificial tears; PFAT's preservative free artificial tears; [place] nuclear sclerotic cataract; PSC posterior subcapsular cataract; ERM epi-retinal membrane; PVD posterior vitreous detachment; RD retinal detachment; DM diabetes mellitus; DR diabetic retinopathy; NPDR non-proliferative diabetic retinopathy; PDR proliferative diabetic retinopathy; CSME clinically significant macular edema; DME diabetic macular edema; dbh dot blot hemorrhages; CWS cotton wool spot; POAG primary open angle glaucoma; C/D cup-to-disc ratio; HVF humphrey visual field; GVF goldmann visual field; OCT optical coherence tomography; IOP intraocular pressure; BRVO Branch retinal vein occlusion; CRVO central retinal vein occlusion; CRAO central retinal artery occlusion; BRAO branch retinal artery occlusion; RT retinal tear; SB scleral buckle; PPV pars plana vitrectomy; VH Vitreous hemorrhage; PRP panretinal laser photocoagulation; IVK intravitreal kenalog; VMT vitreomacular traction; MH Macular hole;  NVD neovascularization of the disc; NVE neovascularization elsewhere; AREDS age related eye disease study; ARMD age related macular degeneration; POAG primary open angle glaucoma; EBMD epithelial/anterior basement membrane dystrophy; ACIOL anterior chamber intraocular lens; IOL intraocular lens; PCIOL posterior chamber intraocular lens; Phaco/IOL phacoemulsification with intraocular lens placement; [place] photorefractive keratectomy; LASIK laser assisted in situ keratomileusis; HTN hypertension; DM diabetes mellitus; COPD chronic obstructive pulmonary disease

## 2020-04-19 ENCOUNTER — Other Ambulatory Visit: Payer: Self-pay | Admitting: Cardiovascular Disease

## 2020-04-22 ENCOUNTER — Ambulatory Visit (INDEPENDENT_AMBULATORY_CARE_PROVIDER_SITE_OTHER): Payer: Medicare Other | Admitting: Ophthalmology

## 2020-04-22 ENCOUNTER — Other Ambulatory Visit: Payer: Self-pay

## 2020-04-22 ENCOUNTER — Encounter (INDEPENDENT_AMBULATORY_CARE_PROVIDER_SITE_OTHER): Payer: Self-pay | Admitting: Ophthalmology

## 2020-04-22 DIAGNOSIS — E113511 Type 2 diabetes mellitus with proliferative diabetic retinopathy with macular edema, right eye: Secondary | ICD-10-CM | POA: Diagnosis not present

## 2020-04-22 DIAGNOSIS — E113512 Type 2 diabetes mellitus with proliferative diabetic retinopathy with macular edema, left eye: Secondary | ICD-10-CM | POA: Diagnosis not present

## 2020-04-22 MED ORDER — BEVACIZUMAB CHEMO INJECTION 1.25MG/0.05ML SYRINGE FOR KALEIDOSCOPE
1.2500 mg | INTRAVITREAL | Status: AC | PRN
  Administered 2020-04-22: 1.25 mg via INTRAVITREAL

## 2020-04-22 NOTE — Assessment & Plan Note (Signed)
OS, CSME persists with center involvement, will deliver intravitreal Avastin OS today

## 2020-04-22 NOTE — Assessment & Plan Note (Signed)
1 week post injection intravitreal Avastin, improved, center involvement improved, foci of leakage appears to be inferonasal to the fovea

## 2020-04-22 NOTE — Progress Notes (Signed)
04/22/2020     CHIEF COMPLAINT Patient presents for Retina Follow Up   HISTORY OF PRESENT ILLNESS: Jennifer [name] is a 64 y.o. female who presents to the clinic today for:   HPI    Retina Follow Up    Patient presents with  Diabetic Retinopathy.  In left eye.  This started 1 week ago.  Severity is moderate.  Duration of 1 week.  Since onset it is stable.          Comments    1 Week F/U OS, poss Avastin OS  Pt denies noticeable changes to [place] OU since last visit. Pt denies ocular pain, flashes of light, or floaters OU.  LBS: 132 yesterday       Last edited by Rockie Neighbours, [place] on 04/22/2020  8:32 AM. (History)      Referring physician: [name], [name], MD [place],  [place] [address]  HISTORICAL INFORMATION:   Selected notes from the MEDICAL RECORD NUMBER    Lab Results  Component Value Date   HGBA1C 6.5 (H) 03/29/2019     CURRENT MEDICATIONS: No current outpatient medications on file. (Ophthalmic Drugs)   No current facility-administered medications for this visit. (Ophthalmic Drugs)   Current Outpatient Medications (Other)  Medication Sig  . amLODipine (NORVASC) 10 MG tablet Take 1 tablet (10 mg total) by mouth daily.  Marland Kitchen aspirin EC 81 MG tablet Take 1 tablet (81 mg total) by mouth 2 (two) times daily. Hold aspirin while you are on apixaban/Eliquis  . atorvastatin (LIPITOR) 80 MG tablet TAKE 1 TABLET BY MOUTH ONCE DAILY AT 6PM  . buPROPion (WELLBUTRIN SR) 150 MG 12 hr tablet Take 1 tablet by mouth twice daily  . carvedilol (COREG) 12.5 MG tablet TAKE 1 TABLET BY MOUTH TWICE DAILY WITH A MEAL  . DULoxetine (CYMBALTA) 60 MG capsule Take 1 capsule by mouth once daily  . furosemide (LASIX) 40 MG tablet Take 1 tablet (40 mg total) by mouth 2 (two) times daily. 8AM, 1 PM  . Insulin Human (INSULIN PUMP) SOLN Inject into the skin continuous. Patient may bolus up to 40 units at a time, with a maximum daily limit of 120 units  . levothyroxine  (SYNTHROID) 137 MCG tablet Take 137 mcg by mouth daily before breakfast.  . nitroGLYCERIN (NITROSTAT) 0.4 MG SL tablet Place 1 tablet (0.4 mg total) under the tongue every 5 (five) minutes x 3 doses as needed for chest pain.  . sitaGLIPtin (JANUVIA) 100 MG tablet Take 100 mg by mouth daily.    No current facility-administered medications for this visit. (Other)      REVIEW OF SYSTEMS:    ALLERGIES No Known Allergies  PAST MEDICAL HISTORY Past Medical History:  Diagnosis Date  . CAD (coronary artery disease)    cath 5/23 100% dist RCA lesion treated with 2 overlapping Integrity Resolute DES ( 2.25x74mm, 2.25x70mm), 60% mid RCA treated medically, 99% OM2 not amenable to PCI, 70% D1 lesion, EF normal  . Hypercholesteremia   . Hypertension   . Hypothyroidism   . Stroke ([place]) 10/2018  . Thyroid disease   . Type 2 diabetes mellitus ([place]) 10/08/2018   Past Surgical History:  Procedure Laterality Date  . ABDOMINAL HYSTERECTOMY    . CARDIAC CATHETERIZATION N/A 11/03/2015   Procedure: Left Heart Cath and Coronary Angiography;  Surgeon: Leonie Man, MD;  Location: [facility];  Service: Cardiovascular;  Laterality: N/A;  . CARDIAC CATHETERIZATION N/A 11/03/2015  Procedure: Coronary Stent Intervention;  Surgeon: Leonie Man, MD;  Location: [facility];  Service: Cardiovascular;  Laterality: N/A;  . CARPAL TUNNEL RELEASE    . CORONARY ANGIOPLASTY     1 stent  . TOTAL KNEE ARTHROPLASTY Right 04/02/2019   Procedure: TOTAL KNEE ARTHROPLASTY;  Surgeon: Carole Civil, MD;  Location: [facility];  Service: Orthopedics;  Laterality: Right;    FAMILY HISTORY Family History  Problem Relation Age of Onset  . Heart disease Mother   . Stroke Sister   . Heart attack Brother     SOCIAL HISTORY Social History   Tobacco Use  . Smoking status: Former Smoker    Packs/day: 0.25    Types: Cigarettes    Quit date: 10/07/2018    Years since quitting: 1.5  . Smokeless  tobacco: Never Used  Substance Use Topics  . Alcohol use: No    Alcohol/week: 0.0 standard drinks  . Drug use: No         OPHTHALMIC EXAM:  Base Eye Exam    Visual Acuity (ETDRS)      Right Left   Dist cc 20/70 -2 CF at 3'   Dist ph cc 20/70 +2 20/400   Correction: Glasses       Tonometry (Tonopen, 8:34 AM)      Right Left   Pressure 12 13       Pupils      Pupils Dark Light Shape React APD   Right PERRL 5 4 Round Brisk None   Left PERRL 5 4 Round Brisk None       Visual Fields (Counting fingers)      Left Right    Full Full       Extraocular Movement      Right Left    Full Full       Neuro/Psych    Oriented x3: Yes   Mood/Affect: Normal       Dilation    Left eye: 1.0% Mydriacyl, 2.5% Phenylephrine @ 8:37 AM        Slit Lamp and Fundus Exam    External Exam      Right Left   External Normal Normal       Slit Lamp Exam      Right Left   Lids/Lashes Normal Normal   Conjunctiva/Sclera White and quiet White and quiet   Cornea Clear Clear   Anterior Chamber Deep and quiet Deep and quiet   Iris Round and reactive Round and reactive   Lens Centered posterior chamber intraocular lens Centered posterior chamber intraocular lens   Anterior Vitreous Normal Normal,        Fundus Exam      Right Left   Posterior Vitreous  vitrectomized   Disc  Normal   C/D Ratio  0.2   Macula  Cystoid macular edema, Mild clinically significant macular edema   Vessels  PDR-quiet   Periphery  PRP 360,  attached          IMAGING AND PROCEDURES  Imaging and Procedures for 04/22/20  OCT, Retina - OU - Both Eyes       Right Eye Quality was good. Scan locations included subfoveal. Central Foveal Thickness: 325. Progression has improved.   Left Eye Quality was good. Scan locations included subfoveal. Central Foveal Thickness: 403. Progression has improved.   Notes CSME OD, vastly improved adjacent to the fovea inferonasal and also less center involved CSME.   This type of focal  leakage may very well Benefit from focal laser treatment to the region of involvement inferonasal to the fovea OD  CSME is also improved OS, post injection contralateral Avastin 1 week prior , Will deliver intravitreal Avastin OS today and continue to monitor OS        Intravitreal Injection, Pharmacologic Agent - OS - Left Eye       Time Out 04/22/2020. 9:22 AM. Confirmed correct patient, procedure, site, and patient consented.   Anesthesia Topical anesthesia was used. Anesthetic medications included Akten 3.5%.   Procedure Preparation included Tobramycin 0.3%, 10% betadine to eyelids, 5% betadine to ocular surface. A 30 gauge needle was used.   Injection:  1.25 mg Bevacizumab (AVASTIN) SOLN   NDC: 70360-001-02, Lot: [id number]   Route: Intravitreal, Site: Left Eye, Waste: 0 mg  Post-op Post injection exam found visual acuity of at least counting fingers. The patient tolerated the procedure well. There were no complications. The patient received written and verbal post procedure care education. Post injection medications were not given.                 ASSESSMENT/PLAN:  Diabetic macular edema of right eye with proliferative retinopathy associated with type 2 diabetes mellitus (HCC) 1 week post injection intravitreal Avastin, improved, center involvement improved, foci of leakage appears to be inferonasal to the fovea  Proliferative diabetic retinopathy of left eye with macular edema associated with type 2 diabetes mellitus (HCC) OS, CSME persists with center involvement, will deliver intravitreal Avastin OS today      ICD-10-CM   1. Proliferative diabetic retinopathy of left eye with macular edema associated with type 2 diabetes mellitus (HCC)  R48.5462 OCT, Retina - OU - Both Eyes    Intravitreal Injection, Pharmacologic Agent - OS - Left Eye    Bevacizumab (AVASTIN) SOLN 1.25 mg  2. Diabetic macular edema of right eye with proliferative retinopathy  associated with type 2 diabetes mellitus ([place])  E11.3511     1.  CSME OD vastly improved 1 week post Avastin.  We will schedule follow-up in 1 week for focal laser with attention to inferonasal to the fovea  2.  OS, CSME lingers with center involvement, will deliver intravitreal Avastin OS today and follow-up dilate left eye in 5 weeks  3.  Ophthalmic Meds Ordered this visit:  Meds ordered this encounter  Medications  . Bevacizumab (AVASTIN) SOLN 1.25 mg       Return in about 1 week (around 04/29/2020) for dilate, OD, FOCAL.  There are no Patient Instructions on file for this visit.   Explained the diagnoses, plan, and follow up with the patient and they expressed understanding.  Patient expressed understanding of the importance of proper follow up care.   Clent Demark Rachell Druckenmiller M.D. Diseases & Surgery of the Retina and Vitreous Retina & Diabetic [place] 04/22/20     Abbreviations: M myopia (nearsighted); A astigmatism; H hyperopia (farsighted); P presbyopia; Mrx spectacle prescription;  CTL contact lenses; OD right eye; OS left eye; OU both eyes  XT exotropia; ET esotropia; PEK punctate epithelial keratitis; PEE punctate epithelial erosions; DES dry eye syndrome; MGD meibomian gland dysfunction; ATs artificial tears; PFAT's preservative free artificial tears; [place] nuclear sclerotic cataract; PSC posterior subcapsular cataract; ERM epi-retinal membrane; PVD posterior vitreous detachment; RD retinal detachment; DM diabetes mellitus; DR diabetic retinopathy; NPDR non-proliferative diabetic retinopathy; PDR proliferative diabetic retinopathy; CSME clinically significant macular edema; DME diabetic macular edema; dbh dot blot hemorrhages; CWS cotton wool spot; POAG primary open  angle glaucoma; C/D cup-to-disc ratio; HVF humphrey visual field; GVF goldmann visual field; OCT optical coherence tomography; IOP intraocular pressure; BRVO Branch retinal vein occlusion; CRVO central retinal vein  occlusion; CRAO central retinal artery occlusion; BRAO branch retinal artery occlusion; RT retinal tear; SB scleral buckle; PPV pars plana vitrectomy; VH Vitreous hemorrhage; PRP panretinal laser photocoagulation; IVK intravitreal kenalog; VMT vitreomacular traction; MH Macular hole;  NVD neovascularization of the disc; NVE neovascularization elsewhere; AREDS age related eye disease study; ARMD age related macular degeneration; POAG primary open angle glaucoma; EBMD epithelial/anterior basement membrane dystrophy; ACIOL anterior chamber intraocular lens; IOL intraocular lens; PCIOL posterior chamber intraocular lens; Phaco/IOL phacoemulsification with intraocular lens placement; [place] photorefractive keratectomy; LASIK laser assisted in situ keratomileusis; HTN hypertension; DM diabetes mellitus; COPD chronic obstructive pulmonary disease

## 2020-04-27 ENCOUNTER — Ambulatory Visit: Payer: Medicare Other | Admitting: Orthopedic Surgery

## 2020-04-28 ENCOUNTER — Other Ambulatory Visit: Payer: Self-pay

## 2020-04-28 ENCOUNTER — Ambulatory Visit: Payer: 59 | Admitting: Podiatry

## 2020-04-28 ENCOUNTER — Ambulatory Visit (INDEPENDENT_AMBULATORY_CARE_PROVIDER_SITE_OTHER): Payer: 59

## 2020-04-28 DIAGNOSIS — Z794 Long term (current) use of insulin: Secondary | ICD-10-CM

## 2020-04-28 DIAGNOSIS — L97423 Non-pressure chronic ulcer of left heel and midfoot with necrosis of muscle: Secondary | ICD-10-CM

## 2020-04-28 DIAGNOSIS — E1142 Type 2 diabetes mellitus with diabetic polyneuropathy: Secondary | ICD-10-CM

## 2020-04-28 DIAGNOSIS — Q6672 Congenital pes cavus, left foot: Secondary | ICD-10-CM | POA: Diagnosis not present

## 2020-04-28 NOTE — Patient Instructions (Signed)
Call for an appointment for vascular testing   [facility] [place],  [place]  [id number]  Main: [phone]

## 2020-04-29 ENCOUNTER — Ambulatory Visit (INDEPENDENT_AMBULATORY_CARE_PROVIDER_SITE_OTHER): Payer: Medicare Other | Admitting: Ophthalmology

## 2020-04-29 ENCOUNTER — Encounter (INDEPENDENT_AMBULATORY_CARE_PROVIDER_SITE_OTHER): Payer: Self-pay | Admitting: Ophthalmology

## 2020-04-29 ENCOUNTER — Encounter: Payer: Self-pay | Admitting: Podiatry

## 2020-04-29 DIAGNOSIS — E113511 Type 2 diabetes mellitus with proliferative diabetic retinopathy with macular edema, right eye: Secondary | ICD-10-CM

## 2020-04-29 DIAGNOSIS — H35371 Puckering of macula, right eye: Secondary | ICD-10-CM

## 2020-04-29 NOTE — Assessment & Plan Note (Signed)
Noncentral involved CSME inferonasal to the fovea, will deliver focal laser treatment today

## 2020-04-29 NOTE — Assessment & Plan Note (Signed)
The nature of macular pucker (epiretinal membrane ERM) was discussed with the patient as well as threshold criteria for vitrectomy surgery. I explained that in rare cases another surgery is needed to actually remove a second wrinkle should it regrow.  Most often, the epiretinal membrane and underlying wrinkled internal limiting membrane are removed with the first surgery, to accomplish the goals.   If the operative eye is [place] (natural lens still present), cataract surgery is often recommended prior to Vitrectomy. This will enable the retina surgeon to have the best view during surgery and the patient to obtain optimal results in the future. Treatment options were discussed.   inferonasal to the fovea

## 2020-04-29 NOTE — Progress Notes (Signed)
04/29/2020     CHIEF COMPLAINT Patient presents for Retina Follow Up   HISTORY OF PRESENT ILLNESS: Jennifer [name] is a 64 y.o. female who presents to the clinic today for:   HPI    Retina Follow Up    Patient presents with  Diabetic Retinopathy.  In right eye.  This started 1 week ago.  Severity is mild.  Duration of 1 week.  Since onset it is stable.          Comments    1 Week Focal OD  Pt denies noticeable changes to [place] OU since last visit. Pt denies ocular pain, flashes of light, or floaters OU.  LBS: did not check       Last edited by Rockie Neighbours, [place] on 04/29/2020  8:25 AM. (History)      Referring physician: [name], [name], MD [place],  [place] [id number]  HISTORICAL INFORMATION:   Selected notes from the MEDICAL RECORD NUMBER    Lab Results  Component Value Date   HGBA1C 6.5 (H) 03/29/2019     CURRENT MEDICATIONS: No current outpatient medications on file. (Ophthalmic Drugs)   No current facility-administered medications for this visit. (Ophthalmic Drugs)   Current Outpatient Medications (Other)  Medication Sig  . amLODipine (NORVASC) 10 MG tablet Take 1 tablet (10 mg total) by mouth daily.  Marland Kitchen aspirin EC 81 MG tablet Take 1 tablet (81 mg total) by mouth 2 (two) times daily. Hold aspirin while you are on apixaban/Eliquis  . atorvastatin (LIPITOR) 80 MG tablet TAKE 1 TABLET BY MOUTH ONCE DAILY AT 6PM  . buPROPion (WELLBUTRIN SR) 150 MG 12 hr tablet Take 1 tablet by mouth twice daily  . carvedilol (COREG) 12.5 MG tablet TAKE 1 TABLET BY MOUTH TWICE DAILY WITH A MEAL  . DULoxetine (CYMBALTA) 60 MG capsule Take 1 capsule by mouth once daily  . furosemide (LASIX) 40 MG tablet Take 1 tablet (40 mg total) by mouth 2 (two) times daily. 8AM, 1 PM  . Insulin Human (INSULIN PUMP) SOLN Inject into the skin continuous. Patient may bolus up to 40 units at a time, with a maximum daily limit of 120 units  . levothyroxine (SYNTHROID) 137 MCG tablet  Take 137 mcg by mouth daily before breakfast.  . nitroGLYCERIN (NITROSTAT) 0.4 MG SL tablet Place 1 tablet (0.4 mg total) under the tongue every 5 (five) minutes x 3 doses as needed for chest pain.  . sitaGLIPtin (JANUVIA) 100 MG tablet Take 100 mg by mouth daily.    No current facility-administered medications for this visit. (Other)      REVIEW OF SYSTEMS:    ALLERGIES No Known Allergies  PAST MEDICAL HISTORY Past Medical History:  Diagnosis Date  . CAD (coronary artery disease)    cath 5/23 100% dist RCA lesion treated with 2 overlapping Integrity Resolute DES ( 2.25x39mm, 2.25x37mm), 60% mid RCA treated medically, 99% OM2 not amenable to PCI, 70% D1 lesion, EF normal  . Hypercholesteremia   . Hypertension   . Hypothyroidism   . Stroke ([place]) 10/2018  . Thyroid disease   . Type 2 diabetes mellitus ([place]) 10/08/2018   Past Surgical History:  Procedure Laterality Date  . ABDOMINAL HYSTERECTOMY    . CARDIAC CATHETERIZATION N/A 11/03/2015   Procedure: Left Heart Cath and Coronary Angiography;  Surgeon: Leonie Man, MD;  Location: [facility];  Service: Cardiovascular;  Laterality: N/A;  . CARDIAC CATHETERIZATION N/A 11/03/2015  Procedure: Coronary Stent Intervention;  Surgeon: Leonie Man, MD;  Location: [facility];  Service: Cardiovascular;  Laterality: N/A;  . CARPAL TUNNEL RELEASE    . CORONARY ANGIOPLASTY     1 stent  . TOTAL KNEE ARTHROPLASTY Right 04/02/2019   Procedure: TOTAL KNEE ARTHROPLASTY;  Surgeon: Carole Civil, MD;  Location: [facility];  Service: Orthopedics;  Laterality: Right;    FAMILY HISTORY Family History  Problem Relation Age of Onset  . Heart disease Mother   . Stroke Sister   . Heart attack Brother     SOCIAL HISTORY Social History   Tobacco Use  . Smoking status: Former Smoker    Packs/day: 0.25    Types: Cigarettes    Quit date: 10/07/2018    Years since quitting: 1.5  . Smokeless tobacco: Never Used  Substance  Use Topics  . Alcohol use: No    Alcohol/week: 0.0 standard drinks  . Drug use: No         OPHTHALMIC EXAM:  Base Eye Exam    Visual Acuity (ETDRS)      Right Left   Dist cc 20/80 +2 20/400   Dist ph cc 20/70 20/200 -1   Correction: Glasses       Tonometry (Tonopen, 8:26 AM)      Right Left   Pressure 13 13       Pupils      Dark Light Shape React APD   Right 5 4 Round Brisk None   Left 4.5 3.5 Round Brisk None       Visual Fields (Counting fingers)      Left Right    Full Full       Extraocular Movement      Right Left    Full Full       Neuro/Psych    Oriented x3: Yes   Mood/Affect: Normal       Dilation    Right eye: 1.0% Mydriacyl, 2.5% Phenylephrine @ 8:29 AM          IMAGING AND PROCEDURES  Imaging and Procedures for 04/29/20  OCT, Retina - OU - Both Eyes       Right Eye Quality was good. Scan locations included subfoveal. Central Foveal Thickness: 332. Progression has been stable. Findings include epiretinal membrane.   Left Eye Quality was good. Scan locations included subfoveal. Central Foveal Thickness: 372. Progression has been stable.   Notes CSME OD, focal laser today       Focal Laser - OD - Right Eye       Time Out Confirmed correct patient, procedure, site, and patient consented.   Anesthesia Topical anesthesia was used. Anesthetic medications included Proparacaine 0.5%.   Laser Information The type of laser was diode. Color was yellow. The duration in seconds was 0.08. The spot size was 100 microns. Laser power was 80. Total spots was 121.   Post-op The patient tolerated the procedure well. There were no complications. The patient received written and verbal post procedure care education.                 ASSESSMENT/PLAN:  Macular pucker, right eye The nature of macular pucker (epiretinal membrane ERM) was discussed with the patient as well as threshold criteria for vitrectomy surgery. I explained that in  rare cases another surgery is needed to actually remove a second wrinkle should it regrow.  Most often, the epiretinal membrane and underlying wrinkled internal limiting membrane are removed with  the first surgery, to accomplish the goals.   If the operative eye is [place] (natural lens still present), cataract surgery is often recommended prior to Vitrectomy. This will enable the retina surgeon to have the best view during surgery and the patient to obtain optimal results in the future. Treatment options were discussed.   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 Macular pucker, right eye  H35.371     1.  CSME OD, for focal laser treatment today and follow-up in our in 4 months OD  2.  CSME OS still with center involvement, scheduled for follow-up in the coming weeks  3.  Ophthalmic Meds Ordered this visit:  No orders of the defined types were placed in this encounter.      Return for As scheduled, dilate, OS, AVASTIN OCT.  There are no Patient Instructions on file for this visit.   Explained the diagnoses, plan, and follow up with the patient and they expressed understanding.  Patient expressed understanding of the importance of proper follow up care.   Clent Demark Asya Derryberry M.D. Diseases & Surgery of the Retina and Vitreous Retina & Diabetic [place] 04/29/20     Abbreviations: M myopia (nearsighted); A astigmatism; H hyperopia (farsighted); P presbyopia; Mrx spectacle prescription;  CTL contact lenses; OD right eye; OS left eye; OU both eyes  XT exotropia; ET esotropia; PEK punctate  epithelial keratitis; PEE punctate epithelial erosions; DES dry eye syndrome; MGD meibomian gland dysfunction; ATs artificial tears; PFAT's preservative free artificial tears; [place] nuclear sclerotic cataract; PSC posterior subcapsular cataract; ERM epi-retinal membrane; PVD posterior vitreous detachment; RD retinal detachment; DM diabetes mellitus; DR diabetic retinopathy; NPDR non-proliferative diabetic retinopathy; PDR proliferative diabetic retinopathy; CSME clinically significant macular edema; DME diabetic macular edema; dbh dot blot hemorrhages; CWS cotton wool spot; POAG primary open angle glaucoma; C/D cup-to-disc ratio; HVF humphrey visual field; GVF goldmann visual field; OCT optical coherence tomography; IOP intraocular pressure; BRVO Branch retinal vein occlusion; CRVO central retinal vein occlusion; CRAO central retinal artery occlusion; BRAO branch retinal artery occlusion; RT retinal tear; SB scleral buckle; PPV pars plana vitrectomy; VH Vitreous hemorrhage; PRP panretinal laser photocoagulation; IVK intravitreal kenalog; VMT vitreomacular traction; MH Macular hole;  NVD neovascularization of the disc; NVE neovascularization elsewhere; AREDS age related eye disease study; ARMD age related macular degeneration; POAG primary open angle glaucoma; EBMD epithelial/anterior basement membrane dystrophy; ACIOL anterior chamber intraocular lens; IOL intraocular lens; PCIOL posterior chamber intraocular lens; Phaco/IOL phacoemulsification with intraocular lens placement; [place] photorefractive keratectomy; LASIK laser assisted in situ keratomileusis; HTN hypertension; DM diabetes mellitus; COPD chronic obstructive pulmonary disease

## 2020-04-29 NOTE — Progress Notes (Signed)
  Subjective:  Patient ID: Jennifer [name], female    DOB: 11-14-1955,  MRN: [id number]  Chief Complaint  Patient presents with  . Cyst    left foot lateral side. 2-3 years painful to walk     64 y.o. female presents with the above complaint. History confirmed with patient.  She has had a painful large ulcerated mass in the plantar lateral fifth metatarsal for about 2 years, this is been treated by Dr. Posey Pronto at [facility].  She is yet to transition her care from him because her insurance has changed and he no longer takes her insurance.  She recently had an MRI.  She was scheduled for surgery but canceled it.  Objective:  Physical Exam: She has +2 DP pulses.  I am unable to palpate the PT pulse.  She has moderate edema in the left lower extremity.  Absent protective sensation.  On the plantar lateral metatarsal base there is a large granuloma and ulcerated lesion protruding from the skin, serous drainage, periwound erythema, no exposed bone cellulitis or purulence.  No malodor.  Radiographs: X-ray of the left foot: Moderate to severe cavus foot type with evidence of prior fifth and fourth metatarsal fractures with healed callus.  No osseous abnormalities in the area of the lesion  MRI 02/11/2020:Addendum    ADDENDUM REPORT: 02/27/2020 17:03  ADDENDUM: Upon further review, there is an ill-defined complex soft tissue lesion at the plantar aspect of the fifth metatarsal base (series 4, image 19; series 5, image 44). This measures approximately 1.3 x 3.1 x 1.2 cm. The lesion is heterogeneous on T2 imaging with areas of increased T2 signal likely representing fluid as well as internal areas of T2 low signal. The lesion is relatively T1 isointense and demonstrates some incomplete rim enhancement at the margins (series 10, images 43-44). There is no significant surrounding soft tissue swelling.  This is nonspecific and could represent adventitial bursitis, chronic hematoma, or less  likely chronic infection.  These results were discussed by telephone on 02/27/2020 at 12:00 pm with provider [facility] PATEL , who verbally acknowledged these results.   Electronically Signed   By: Titus Dubin M.D.   On: 02/27/2020 17:03    Assessment:   1. 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   -With her that the ulcer has been longstanding and since it has not healed I recommend that we biopsy it, potentially excise it.  Prior to this I think be prudent to obtain noninvasive vascular testing.  I ordered an ABI/TBI with ultrasound to be completed.  Also recommend that we obtain some lab work to evaluate if there is underlying infection.  I reviewed her most recent MRI and there does not appear to be evidence of osteomyelitis.  There is a large subcutaneous fluid collection I think protruding from the mass.  Discussed with her that any surgery on this area could have a high risk of wound dehiscence due to the location as well as her diabetes.  She would likely need to be restricted weightbearing on this as well after surgery.  -She will follow me 1 month after the above and we will discuss surgical intervention in the interim continue local wound care with daily dressings and antibiotic ointment.  No follow-ups on file.

## 2020-05-10 ENCOUNTER — Other Ambulatory Visit: Payer: Self-pay | Admitting: Family Medicine

## 2020-05-12 NOTE — Telephone Encounter (Signed)
Last filled on 10/02/19 #90 caps with 1 refills, last OV was a surgical clearance on 03/13/20

## 2020-05-20 ENCOUNTER — Other Ambulatory Visit: Payer: Self-pay | Admitting: Podiatry

## 2020-05-21 LAB — CBC WITH DIFFERENTIAL/PLATELET
Basophils Absolute: 0.1 10*3/uL (ref 0.0–0.2)
Basos: 1 %
EOS (ABSOLUTE): 0.1 10*3/uL (ref 0.0–0.4)
Eos: 1 %
Hematocrit: 35.8 % (ref 34.0–46.6)
Hemoglobin: 11.6 g/dL (ref 11.1–15.9)
Immature Grans (Abs): 0 10*3/uL (ref 0.0–0.1)
Immature Granulocytes: 1 %
Lymphocytes Absolute: 1.7 10*3/uL (ref 0.7–3.1)
Lymphs: 20 %
MCH: 27.1 pg (ref 26.6–33.0)
MCHC: 32.4 g/dL (ref 31.5–35.7)
MCV: 84 fL (ref 79–97)
Monocytes Absolute: 0.6 10*3/uL (ref 0.1–0.9)
Monocytes: 7 %
Neutrophils Absolute: 6 10*3/uL (ref 1.4–7.0)
Neutrophils: 70 %
Platelets: 276 10*3/uL (ref 150–450)
RBC: 4.28 x10E6/uL (ref 3.77–5.28)
RDW: 13.5 % (ref 11.7–15.4)
WBC: 8.5 10*3/uL (ref 3.4–10.8)

## 2020-05-21 LAB — SEDIMENTATION RATE: Sed Rate: 34 mm/hr (ref 0–40)

## 2020-05-21 LAB — C-REACTIVE PROTEIN: CRP: <1 mg/L (ref 0–10)

## 2020-05-28 ENCOUNTER — Ambulatory Visit (INDEPENDENT_AMBULATORY_CARE_PROVIDER_SITE_OTHER): Payer: Medicare Other | Admitting: Orthopedic Surgery

## 2020-05-28 ENCOUNTER — Encounter: Payer: Self-pay | Admitting: Orthopedic Surgery

## 2020-05-28 ENCOUNTER — Ambulatory Visit: Payer: Medicare Other

## 2020-05-28 ENCOUNTER — Other Ambulatory Visit: Payer: Self-pay

## 2020-05-28 ENCOUNTER — Ambulatory Visit (INDEPENDENT_AMBULATORY_CARE_PROVIDER_SITE_OTHER): Payer: Medicare Other | Admitting: Ophthalmology

## 2020-05-28 ENCOUNTER — Encounter (INDEPENDENT_AMBULATORY_CARE_PROVIDER_SITE_OTHER): Payer: Self-pay | Admitting: Ophthalmology

## 2020-05-28 VITALS — BP 220/104 | HR 90 | Ht 67.0 in | Wt 278.0 lb

## 2020-05-28 DIAGNOSIS — M1710 Unilateral primary osteoarthritis, unspecified knee: Secondary | ICD-10-CM

## 2020-05-28 DIAGNOSIS — E11319 Type 2 diabetes mellitus with unspecified diabetic retinopathy without macular edema: Secondary | ICD-10-CM | POA: Insufficient documentation

## 2020-05-28 DIAGNOSIS — E113512 Type 2 diabetes mellitus with proliferative diabetic retinopathy with macular edema, left eye: Secondary | ICD-10-CM | POA: Diagnosis not present

## 2020-05-28 DIAGNOSIS — H4312 Vitreous hemorrhage, left eye: Secondary | ICD-10-CM | POA: Diagnosis not present

## 2020-05-28 DIAGNOSIS — I6523 Occlusion and stenosis of bilateral carotid arteries: Secondary | ICD-10-CM | POA: Diagnosis not present

## 2020-05-28 DIAGNOSIS — Z96651 Presence of right artificial knee joint: Secondary | ICD-10-CM

## 2020-05-28 DIAGNOSIS — E113511 Type 2 diabetes mellitus with proliferative diabetic retinopathy with macular edema, right eye: Secondary | ICD-10-CM

## 2020-05-28 DIAGNOSIS — Z9641 Presence of insulin pump (external) (internal): Secondary | ICD-10-CM | POA: Insufficient documentation

## 2020-05-28 DIAGNOSIS — E1121 Type 2 diabetes mellitus with diabetic nephropathy: Secondary | ICD-10-CM | POA: Insufficient documentation

## 2020-05-28 DIAGNOSIS — M1711 Unilateral primary osteoarthritis, right knee: Secondary | ICD-10-CM | POA: Diagnosis not present

## 2020-05-28 MED ORDER — BEVACIZUMAB 2.5 MG/0.1ML IZ SOSY
2.5000 mg | PREFILLED_SYRINGE | INTRAVITREAL | Status: AC | PRN
  Administered 2020-05-28: 2.5 mg via INTRAVITREAL

## 2020-05-28 NOTE — Progress Notes (Signed)
05/28/2020     CHIEF COMPLAINT Patient presents for Retina Follow Up (5 Wk FU OS, POSS AVASTIN OS////Pt reports stable vision OU, no new f/f OU, no pain or pressure OU./////Last A1C: 7.2   11/2019////Last BS: unsure )   HISTORY OF PRESENT ILLNESS: Jennifer [name] is a 64 y.o. female who presents to the clinic today for:   HPI    Retina Follow Up    Patient presents with  Diabetic Retinopathy.  In left eye.  This started 5 weeks ago.  Duration of 5 weeks.  Since onset it is stable. Additional comments: 5 Wk FU OS, POSS AVASTIN OS    Pt reports stable vision OU, no new f/f OU, no pain or pressure OU.     Last A1C: 7.2   11/2019    Last BS: unsure        Last edited by Nichola Sizer D on 05/28/2020  8:21 AM. (History)      Referring physician: [name], [name], MD [place],  [place] [id number]  HISTORICAL INFORMATION:   Selected notes from the MEDICAL RECORD NUMBER    Lab Results  Component Value Date   HGBA1C 6.5 (H) 03/29/2019     CURRENT MEDICATIONS: No current outpatient medications on file. (Ophthalmic Drugs)   No current facility-administered medications for this visit. (Ophthalmic Drugs)   Current Outpatient Medications (Other)  Medication Sig  . amLODipine (NORVASC) 10 MG tablet Take 1 tablet (10 mg total) by mouth daily.  Marland Kitchen aspirin EC 81 MG tablet Take 1 tablet (81 mg total) by mouth 2 (two) times daily. Hold aspirin while you are on apixaban/Eliquis  . atorvastatin (LIPITOR) 80 MG tablet TAKE 1 TABLET BY MOUTH ONCE DAILY AT 6PM  . buPROPion (WELLBUTRIN SR) 150 MG 12 hr tablet Take 1 tablet by mouth twice daily  . carvedilol (COREG) 12.5 MG tablet TAKE 1 TABLET BY MOUTH TWICE DAILY WITH A MEAL  . DULoxetine (CYMBALTA) 60 MG capsule Take 1 capsule by mouth once daily  . furosemide (LASIX) 40 MG tablet Take 1 tablet (40 mg total) by mouth 2 (two) times daily. 8AM, 1 PM  . Insulin Human (INSULIN PUMP) SOLN Inject into the skin  continuous. Patient may bolus up to 40 units at a time, with a maximum daily limit of 120 units  . levothyroxine (SYNTHROID) 137 MCG tablet Take 137 mcg by mouth daily before breakfast.  . nitroGLYCERIN (NITROSTAT) 0.4 MG SL tablet Place 1 tablet (0.4 mg total) under the tongue every 5 (five) minutes x 3 doses as needed for chest pain.  . sitaGLIPtin (JANUVIA) 100 MG tablet Take 100 mg by mouth daily.    No current facility-administered medications for this visit. (Other)      REVIEW OF SYSTEMS:    ALLERGIES No Known Allergies  PAST MEDICAL HISTORY Past Medical History:  Diagnosis Date  . CAD (coronary artery disease)    cath 5/23 100% dist RCA lesion treated with 2 overlapping Integrity Resolute DES ( 2.25x29mm, 2.25x65mm), 60% mid RCA treated medically, 99% OM2 not amenable to PCI, 70% D1 lesion, EF normal  . Hypercholesteremia   . Hypertension   . Hypothyroidism   . Stroke ([place]) 10/2018  . Thyroid disease   . Type 2 diabetes mellitus ([place]) 10/08/2018   Past Surgical History:  Procedure Laterality Date  . ABDOMINAL HYSTERECTOMY    . CARDIAC CATHETERIZATION N/A 11/03/2015   Procedure: Left Heart Cath and Coronary Angiography;  Surgeon:  Leonie Man, MD;  Location: [facility];  Service: Cardiovascular;  Laterality: N/A;  . CARDIAC CATHETERIZATION N/A 11/03/2015   Procedure: Coronary Stent Intervention;  Surgeon: Leonie Man, MD;  Location: [facility];  Service: Cardiovascular;  Laterality: N/A;  . CARPAL TUNNEL RELEASE    . CORONARY ANGIOPLASTY     1 stent  . TOTAL KNEE ARTHROPLASTY Right 04/02/2019   Procedure: TOTAL KNEE ARTHROPLASTY;  Surgeon: Carole Civil, MD;  Location: [facility];  Service: Orthopedics;  Laterality: Right;    FAMILY HISTORY Family History  Problem Relation Age of Onset  . Heart disease Mother   . Stroke Sister   . Heart attack Brother     SOCIAL HISTORY Social History   Tobacco Use  . Smoking status: Former Smoker     Packs/day: 0.25    Types: Cigarettes    Quit date: 10/07/2018    Years since quitting: 1.6  . Smokeless tobacco: Never Used  Substance Use Topics  . Alcohol use: No    Alcohol/week: 0.0 standard drinks  . Drug use: No         OPHTHALMIC EXAM: Base Eye Exam    Visual Acuity (ETDRS)      Right Left   Dist cc 20/70 +2 20/400   Dist ph cc 20/50 +2 20/200   Correction: Glasses       Tonometry (Tonopen, 8:28 AM)      Right Left   Pressure 11 14       Pupils      Dark Light Shape React APD   Right 5 4 Round Brisk None   Left 4.5 3.5 Round Brisk None       Visual Fields (Counting fingers)      Left Right    Full Full       Extraocular Movement      Right Left    Full Full       Neuro/Psych    Oriented x3: Yes   Mood/Affect: Normal       Dilation    Left eye: 1.0% Mydriacyl, 2.5% Phenylephrine @ 8:28 AM        Slit Lamp and Fundus Exam    External Exam      Right Left   External Normal Normal       Slit Lamp Exam      Right Left   Lids/Lashes Normal Normal   Conjunctiva/Sclera White and quiet White and quiet   Cornea Clear Clear   Anterior Chamber Deep and quiet Deep and quiet   [place]ris Round and reactive Round and reactive   Lens Centered posterior chamber intraocular lens Centered posterior chamber intraocular lens   Anterior Vitreous Normal Normal,        Fundus Exam      Right Left   Posterior Vitreous  vitrectomized   Disc  Normal   C/D Ratio  0.2   Macula  Cystoid macular edema, Mild clinically significant macular edema   Vessels  PDR-quiet   Periphery  Good PRP 360,  attached          IMAGING AND PROCEDURES  Imaging and Procedures for 05/28/20  OCT, Retina - OU - Both Eyes       Right Eye Quality was good. Scan locations included subfoveal. Central Foveal Thickness: 305. Findings include cystoid macular edema.   Left Eye Quality was good. Scan locations included subfoveal. Central Foveal Thickness: 374. Findings include cystoid  macular edema.  Notes Extra foveal CSME OD much improved inferonasally status post recent focal laser treatment delivered 1 month prior.  OS with center involved CSME still active although slightly improved overall.  Will retreat with intravitreal Avastin today And consider focal laser treatment inferonasal to the fovea soon       Intravitreal Injection, Pharmacologic Agent - OS - Left Eye       Time Out 05/28/2020. 9:12 AM. Confirmed correct patient, procedure, site, and patient consented.   Anesthesia Topical anesthesia was used. Anesthetic medications included [place] 3.5%.   Procedure Preparation included 10% betadine to eyelids, 5% betadine to ocular surface, Ofloxacin . A 30 gauge needle was used.   Injection:  2.5 mg Bevacizumab (AVASTIN) 2.5mg /0.11mL SOSY   NDC: 23300-762-26, Lot: [id number]   Route: Intravitreal, Site: Left Eye  Post-op Post injection exam found visual acuity of at least counting fingers. The patient tolerated the procedure well. There were no complications. The patient received written and verbal post procedure care education. Post injection medications were not given.                 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 Proliferative diabetic retinopathy of left eye with macular edema associated with type 2 diabetes mellitus (HCC)  Y56.3893 OCT, Retina - OU - Both Eyes    Intravitreal Injection, Pharmacologic Agent - OS - Left Eye    bevacizumab (AVASTIN) SOSY 2.5 mg  2. Vitreous hemorrhage  of left eye (HCC)  H43.12   3. 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  Examination today OS confirms CSME slightly worse as compared to 1 month previous, when the left eye was recent post Avastin for CSME.  Thus the area of leakage inferonasal to the fovea of the left eye is trigger and center involved CSME and we will repeat Avastin injection OS today followed soon thereafter, 2 weeks by focal laser to the inferonasal quadrant  2.  OD, 1 month status post focal treatment inferonasal to the fovea for CSME now with much improved CSME overall continue to observe and not need treatment with Avastin OD for the foreseeable future  3.  Dilate OS next, OCT and focal laser treatment inferonasal to the fovea  Ophthalmic Meds Ordered this visit:  Meds ordered this encounter  Medications  . bevacizumab (AVASTIN) SOSY 2.5 mg       Return in about 2 weeks (around 06/11/2020) for OS, FOCAL.  There are no Patient Instructions on file for this visit.   Explained the diagnoses, plan, and follow up with the patient and they expressed understanding.  Patient expressed understanding of the importance of proper follow up care.   Clent Demark Mitzie Marlar M.D. Diseases & Surgery of the Retina and Vitreous Retina & Diabetic [place] 05/28/20     Abbreviations: M myopia (nearsighted); A astigmatism; H hyperopia (farsighted); P presbyopia; Mrx spectacle prescription;  CTL contact lenses; OD right eye; OS left eye; OU both eyes  XT exotropia; ET esotropia; PEK punctate  epithelial keratitis; PEE punctate epithelial erosions; DES dry eye syndrome; MGD meibomian gland dysfunction; ATs artificial tears; PFAT's preservative free artificial tears; [place] nuclear sclerotic cataract; PSC posterior subcapsular cataract; ERM epi-retinal membrane; PVD posterior vitreous detachment; RD retinal detachment; DM diabetes mellitus; DR diabetic  retinopathy; NPDR non-proliferative diabetic retinopathy; PDR proliferative diabetic retinopathy; CSME clinically significant macular edema; DME diabetic macular edema; dbh dot blot hemorrhages; CWS cotton wool spot; POAG primary open angle glaucoma; C/D cup-to-disc ratio; HVF humphrey visual field; GVF goldmann visual field; OCT optical coherence tomography; IOP intraocular pressure; BRVO Branch retinal vein occlusion; CRVO central retinal vein occlusion; CRAO central retinal artery occlusion; BRAO branch retinal artery occlusion; RT retinal tear; SB scleral buckle; PPV pars plana vitrectomy; VH Vitreous hemorrhage; PRP panretinal laser photocoagulation; IVK intravitreal kenalog; VMT vitreomacular traction; MH Macular hole;  NVD neovascularization of the disc; NVE neovascularization elsewhere; AREDS age related eye disease study; ARMD age related macular degeneration; POAG primary open angle glaucoma; EBMD epithelial/anterior basement membrane dystrophy; ACIOL anterior chamber intraocular lens; IOL intraocular lens; PCIOL posterior chamber intraocular lens; Phaco/IOL phacoemulsification with intraocular lens placement; [place] photorefractive keratectomy; LASIK laser assisted in situ keratomileusis; HTN hypertension; DM diabetes mellitus; COPD chronic obstructive pulmonary disease

## 2020-05-28 NOTE — Assessment & Plan Note (Signed)
Stable, less active proliferative disease chronic CSME persist however

## 2020-05-28 NOTE — Patient Instructions (Signed)
The knee is stable and the implant is in good condition   If you have any problems with the implant let us know

## 2020-05-28 NOTE — Progress Notes (Signed)
Chief Complaint  Patient presents with  . Follow-up    Recheck on right knee, DOS 03-23-19.    Encounter Diagnoses  Name Primary?  . 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 Arthritis of knee     64 year old female status post knee replacement comes in for yearly follow-up  She complains of occasional pain in the right knee  She is is not using an assistive device  The knee incision looks The range of motion is 120 The stability of the knee is normal  Motor exam is normal  ulcer left foot under treatment in [place]  X-ray  shows no loosening or complications   Plan  Follow up prn

## 2020-05-28 NOTE — Assessment & Plan Note (Signed)
Appears resolved

## 2020-05-28 NOTE — Assessment & Plan Note (Signed)
CSME component of disease has improved dramatically post recent focal laser inferonasal to the macula

## 2020-05-29 ENCOUNTER — Ambulatory Visit (INDEPENDENT_AMBULATORY_CARE_PROVIDER_SITE_OTHER): Payer: Medicare Other | Admitting: Podiatry

## 2020-05-29 DIAGNOSIS — Q6672 Congenital pes cavus, left foot: Secondary | ICD-10-CM | POA: Diagnosis not present

## 2020-05-29 DIAGNOSIS — I739 Peripheral vascular disease, unspecified: Secondary | ICD-10-CM

## 2020-05-29 DIAGNOSIS — Z794 Long term (current) use of insulin: Secondary | ICD-10-CM | POA: Diagnosis not present

## 2020-05-29 DIAGNOSIS — L97423 Non-pressure chronic ulcer of left heel and midfoot with necrosis of muscle: Secondary | ICD-10-CM | POA: Diagnosis not present

## 2020-05-29 DIAGNOSIS — E1142 Type 2 diabetes mellitus with diabetic polyneuropathy: Secondary | ICD-10-CM | POA: Diagnosis not present

## 2020-05-29 MED ORDER — MUPIROCIN 2 % EX OINT: 1.0000 "application " | TOPICAL_OINTMENT | Freq: Two times a day (BID) | CUTANEOUS | 2 refills | Status: DC

## 2020-05-29 NOTE — Patient Instructions (Addendum)
[facility] at [address] [place],  [place]  [address]   Call Main: [phone] to schedule your testing appointment

## 2020-05-31 ENCOUNTER — Other Ambulatory Visit: Payer: Self-pay | Admitting: Cardiovascular Disease

## 2020-05-31 NOTE — Progress Notes (Signed)
  Subjective:  Patient ID: Jennifer [name], female    DOB: Apr 18, 1956,  MRN: [id number]  Chief Complaint  Patient presents with  . foot mass    PT stated that she is doing okay she is still having pain in that area. She is concerned about the healing process.    64 y.o. female returns with the above complaint. History confirmed with patient. She has not completed the noninvasive vascular testing yet. The mass is still painful.  Objective:  Physical Exam: She has +2 DP pulses.  I am unable to palpate the PT pulse.  She has moderate edema in the left lower extremity.  Absent protective sensation.  On the plantar lateral metatarsal base there is a large granuloma and ulcerated lesion protruding from the skin, serous drainage, periwound erythema, no exposed bone cellulitis or purulence.  No malodor. Measures approximately 1.2 cm x 1.0 cm x 0.2 cm  Radiographs: X-ray of the left foot: Moderate to severe cavus foot type with evidence of prior fifth and fourth metatarsal fractures with healed callus.  No osseous abnormalities in the area of the lesion  MRI 02/11/2020:Addendum    ADDENDUM REPORT: 02/27/2020 17:03  ADDENDUM: Upon further review, there is an ill-defined complex soft tissue lesion at the plantar aspect of the fifth metatarsal base (series 4, image 19; series 5, image 44). This measures approximately 1.3 x 3.1 x 1.2 cm. The lesion is heterogeneous on T2 imaging with areas of increased T2 signal likely representing fluid as well as internal areas of T2 low signal. The lesion is relatively T1 isointense and demonstrates some incomplete rim enhancement at the margins (series 10, images 43-44). There is no significant surrounding soft tissue swelling.  This is nonspecific and could represent adventitial bursitis, chronic hematoma, or less likely chronic infection.  These results were discussed by telephone on 02/27/2020 at 12:00 pm with provider Jennifer [name] , who verbally  acknowledged these results.   Electronically Signed   By: Jennifer [name] M.D.   On: 02/27/2020 17:03    Assessment:   1. 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 Pes cavus of left foot   3. Diabetic peripheral neuropathy ([place])   4. Type 2 diabetes mellitus with diabetic polyneuropathy, with long-term current use of insulin ([place])      Plan:  Patient was evaluated and treated and all questions answered.   -I reordered her noninvasive vascular testing to have this evaluated prior to planning any surgical excision. I gave her info more to get it done at [facility] in [place] left foot -Debridement as below. -Dressed with Iodosorb, DSD. -Rx for mupirocin was sent to her pharmacy  Procedure: Excisional Debridement of Wound Rationale: Removal of non-viable soft tissue from the wound to promote healing.  Anesthesia: none Pre-Debridement Wound Measurements:  1.2 cm x 1.0 cm x 0.2 cm Post-Debridement Wound Measurements: Same as predebridement Type of Debridement: Sharp selective Tissue Removed: Non-viable soft tissue Depth of Debridement: subcutaneous tissue. Technique: Sharp selective debridement to bleeding, viable wound base.  Dressing: Dry, sterile, compression dressing. Disposition: Patient tolerated procedure well.       Return in about 3 weeks (around 06/19/2020).

## 2020-06-01 ENCOUNTER — Encounter (INDEPENDENT_AMBULATORY_CARE_PROVIDER_SITE_OTHER): Payer: Medicare Other | Admitting: Ophthalmology

## 2020-06-03 NOTE — Progress Notes (Addendum)
Cardiology Office Note  Date: 06/04/2020   ID: Jennifer [name], DOB March 31, 1956, MRN [id number]  PCP:  Abner Greenspan, MD  Cardiologist:  No primary care provider on file. Electrophysiologist:  None   Chief Complaint: Follow-up CAD, HTN, HLD, obesity  History of Present Illness: Jennifer [name] is a 64 y.o. female with a history of CAD (status post PCI to the RCA 2017), HLD, HTN, obesity, CVA, thyroid disease, DM 2, venous insufficiency, lower extremity edema.  Last seen by Eleonore Chiquito 04/01/2019 for preoperative cardiovascular examination.  She denied any symptoms of angina or shortness of breath with activity.  Stated she could walk all day and have no anginal symptoms or shortness of breath.  She was limited by knee pain which was pending replacement.  Main limitations were due to arthritic issues.  She is here today for preop clearance for left foot surgery.  She states she has been without her Norvasc for over a month and blood pressure is significantly elevated on arrival today with blood pressure of 180/100.  On a previous visit to another provider on 05-28-2020 her blood pressure was 220/104.  States she has been having some recent headaches but otherwise no anginal or exertional symptoms, palpitations or arrhythmias, orthostatic symptoms, CVA or TIA-like symptoms, PND, orthopnea, bleeding issues, claudication-like symptoms, DVT or PE-like symptoms, lower extremity edema.  Her left foot is wrapped with a Coban dressing.   Past Medical History:  Diagnosis Date  . CAD (coronary artery disease)    cath 5/23 100% dist RCA lesion treated with 2 overlapping Integrity Resolute DES ( 2.25x44mm, 2.25x32mm), 60% mid RCA treated medically, 99% OM2 not amenable to PCI, 70% D1 lesion, EF normal  . Hypercholesteremia   . Hypertension   . Hypothyroidism   . Stroke ([place]) 10/2018  . Thyroid disease   . Type 2 diabetes mellitus ([place]) 10/08/2018    Past Surgical History:  Procedure Laterality  Date  . ABDOMINAL HYSTERECTOMY    . CARDIAC CATHETERIZATION N/A 11/03/2015   Procedure: Left Heart Cath and Coronary Angiography;  Surgeon: Leonie Man, MD;  Location: [facility];  Service: Cardiovascular;  Laterality: N/A;  . CARDIAC CATHETERIZATION N/A 11/03/2015   Procedure: Coronary Stent Intervention;  Surgeon: Leonie Man, MD;  Location: [facility];  Service: Cardiovascular;  Laterality: N/A;  . CARPAL TUNNEL RELEASE    . CORONARY ANGIOPLASTY     1 stent  . TOTAL KNEE ARTHROPLASTY Right 04/02/2019   Procedure: TOTAL KNEE ARTHROPLASTY;  Surgeon: Carole Civil, MD;  Location: [facility];  Service: Orthopedics;  Laterality: Right;    Current Outpatient Medications  Medication Sig Dispense Refill  . amLODipine (NORVASC) 10 MG tablet Take 1 tablet (10 mg total) by mouth daily. 30 tablet 5  . aspirin EC 81 MG tablet Take 1 tablet (81 mg total) by mouth 2 (two) times daily. Hold aspirin while you are on apixaban/Eliquis 1 tablet 0  . atorvastatin (LIPITOR) 80 MG tablet TAKE 1 TABLET BY MOUTH ONCE DAILY AT  6:00  PM 30 tablet 0  . buPROPion (WELLBUTRIN SR) 150 MG 12 hr tablet Take 1 tablet by mouth twice daily 180 tablet 0  . carvedilol (COREG) 12.5 MG tablet TAKE 1 TABLET BY MOUTH TWICE DAILY WITH A MEAL 60 tablet 6  . DULoxetine (CYMBALTA) 60 MG capsule Take 1 capsule by mouth once daily 90 capsule 1  . ezetimibe (ZETIA) 10 MG tablet Take 10 mg by mouth  daily.    . furosemide (LASIX) 40 MG tablet Take 1 tablet (40 mg total) by mouth 2 (two) times daily. 8AM, 1 PM 60 tablet 3  . insulin aspart (NOVOLOG) 100 UNIT/ML injection 100u/day    . Insulin Human (INSULIN PUMP) SOLN Inject into the skin continuous. Patient may bolus up to 40 units at a time, with a maximum daily limit of 120 units    . levothyroxine (SYNTHROID) 137 MCG tablet Take 137 mcg by mouth daily before breakfast.    . mupirocin ointment (BACTROBAN) 2 % Apply 1 application topically 2 (two) times daily.  30 g 2  . nitroGLYCERIN (NITROSTAT) 0.4 MG SL tablet Place 1 tablet (0.4 mg total) under the tongue every 5 (five) minutes x 3 doses as needed for chest pain. 25 tablet 3  . sitaGLIPtin (JANUVIA) 100 MG tablet Take 100 mg by mouth daily.      No current facility-administered medications for this visit.   Allergies:  Metformin hcl   Social History: The patient  reports that she has been smoking cigarettes. She has been smoking about 0.25 packs per day. She has never used smokeless tobacco. She reports that she does not drink alcohol and does not use drugs.   Family History: The patient's family history includes Heart attack in her brother; Heart disease in her mother; Stroke in her sister.   ROS:  Please see the history of present illness. Otherwise, complete review of systems is positive for none.  All other systems are reviewed and negative.   Physical Exam: VS:  BP (!) 180/100   Pulse 74   Ht 5\' 7"  (1.702 m)   Wt 279 lb 3.2 oz (126.6 kg)   SpO2 97%   BMI 43.73 kg/m , BMI Body mass index is 43.73 kg/m.  Wt Readings from Last 3 Encounters:  06/04/20 279 lb 3.2 oz (126.6 kg)  05/28/20 278 lb (126.1 kg)  03/13/20 282 lb (127.9 kg)    General: Morbidly obese patient appears comfortable at rest. Neck: Supple, no elevated JVP or carotid bruits, no thyromegaly. Lungs: Clear to auscultation, nonlabored breathing at rest. Cardiac: Regular rate and rhythm, no S3 or significant systolic murmur, no pericardial rub. Extremities: No pitting edema, distal pulses 2+. Skin: Warm and dry. Musculoskeletal: No kyphosis. Neuropsychiatric: Alert and oriented x3, affect grossly appropriate.  ECG:  An ECG dated 06-04-2020 was personally reviewed today and demonstrated:  Normal sinus rhythm rate of 77.  Recent Labwork: 03/13/2020: ALT 11; AST 12; BUN 25; Creatinine, Ser 1.38; Potassium 4.1; Sodium 141 05/20/2020: Hemoglobin 11.6; Platelets 276     Component Value Date/Time   CHOL 151 03/13/2020  1234   TRIG 96.0 03/13/2020 1234   HDL 48.80 03/13/2020 1234   CHOLHDL 3 03/13/2020 1234   VLDL 19.2 03/13/2020 1234   LDLCALC 83 03/13/2020 1234   LDLDIRECT 154.2 11/04/2011 1018    Other Studies Reviewed Today:   TTE 10/10/2018 1. The left ventricle has normal systolic function, with an ejection fraction of 55-60%. The cavity size was normal. Left ventricular diastolic Doppler parameters are consistent with impaired relaxation. No evidence of left ventricular regional wall  motion abnormalities. 2. Moderate hypokinesis of the left ventricular, basal inferior wall. 3. The right ventricle has normal systolic function. The cavity was normal. There is no increase in right ventricular wall thickness. Right ventricular systolic pressure normal with an estimated pressure of 32.0 mmHg. 4. The aortic valve is tricuspid. Mild aortic annular calcification noted. 5. The mitral  valve is grossly normal. There is mild mitral annular calcification present. 6. The tricuspid valve is grossly normal. 7. The aortic root is normal in size and structure. 8. There is aneurysmal motion of the interatrial septum with evidence of PFO by agitated saline injection, right to left shunting seen intermittently.  LHC 11/04/2015  Dist RCA lesion, 100% stenosed. Post intervention - PTCA followed by PCI with 2 overlapping Integrity Resolute DES Stents (2.25 mm x 14 mm, 2.25 mm x 12 mm), there is a 0% residual stenosis.  Mid RCA lesion, 60% stenosed. Not flow-limiting. The decision was made to treat this medically.  2nd Mrg lesion, 99% stenosed. Prior to visualization of the distal RCA occlusion, this was thought to be a potential culprit, however not PCI amenable.  Lat 1st Mrg lesion, 55% stenosed.  1st Diag lesion, 70% stenosed.  Preserved LVEF with normal LVEDP  The left ventricular systolic function is normal.   Assessment and Plan:  1. Preoperative clearance   2. CAD in native artery   3.  Essential hypertension   4. Hyperlipidemia LDL goal <70   5. Morbid obesity ([place])    1. Preop clearance She is here for preop clearance for left foot surgery pending with [facility] with Dr. Alene Mires.  Her [facility] activity status index is 16.2 giving her functional capacity and METS at 4.73.  Her revised cardiac risk index score is 4 giving her a perioperative risk of major cardiac event of 11%.  From cardiac standpoint she would be cleared to undergo foot surgery under general anesthesia except for elevated blood pressure.  We are restarting her amlodipine at 10 mg today.   After 2 weeks of restarting amlodipine she returned was a blood pressure during nursing visit 06/18/2020 of 150/70. From a cardiac standpoint she is cleared to undergo left foot surgery with Dr. Alene Mires [facility]   2. CAD in native artery Previous cardiac catheterization 520 09/2015: DES to 100% RCA lesion x2 with 0% residual stenosis.  Mid RCA lesion 60% not flow-limiting treating medically.  Second marginal lesion 99% not PCI amenable.  Lateral first marginal 55%, first diagonal 70%.  Denies any recent anginal or exertional symptoms.  Continue aspirin 81 mg, carvedilol 12.5 mg p.o. twice daily.  Nitroglycerin sublingual 0.4 mg as needed  3. Essential hypertension Blood pressure elevated today at 180/100.  Was taking amlodipine but has been without it for over 1 month.  Will restart amlodipine 10 mg.  She is to come back in 2 weeks for nursing visit to recheck blood pressure.  Continue carvedilol 12.5 mg p.o. twice daily. Lasix 40 mg p.o. twice daily.   4. Hyperlipidemia LDL goal <70 Last lipid panel on 10/10/2018: TC 173, TG 142, HDL 37, LDL 108.  Continue atorvastatin 80 mg daily.  Continue Zetia 10 mg daily.   5. Morbid obesity ([place]) BMI is 43.7.  Medication Adjustments/Labs and Tests Ordered: Current medicines are reviewed at length with the patient today.  Concerns regarding medicines are outlined above.    Disposition: Follow-up with Dr Domenic Polite or APP 6 months.  Signed, Levell July, NP 06/04/2020 9:53 AM    [facility] at [place], [place], [place] [address] Phone: [phone]; Fax: [phone]

## 2020-06-04 ENCOUNTER — Ambulatory Visit (INDEPENDENT_AMBULATORY_CARE_PROVIDER_SITE_OTHER): Payer: Medicare Other | Admitting: Family Medicine

## 2020-06-04 ENCOUNTER — Encounter: Payer: Self-pay | Admitting: Family Medicine

## 2020-06-04 VITALS — BP 180/100 | HR 74 | Ht 67.0 in | Wt 279.2 lb

## 2020-06-04 DIAGNOSIS — I2510 Atherosclerotic heart disease of native coronary artery without angina pectoris: Secondary | ICD-10-CM | POA: Diagnosis not present

## 2020-06-04 DIAGNOSIS — Z01818 Encounter for other preprocedural examination: Secondary | ICD-10-CM | POA: Diagnosis not present

## 2020-06-04 DIAGNOSIS — I10 Essential (primary) hypertension: Secondary | ICD-10-CM | POA: Diagnosis not present

## 2020-06-04 DIAGNOSIS — E785 Hyperlipidemia, unspecified: Secondary | ICD-10-CM | POA: Diagnosis not present

## 2020-06-04 MED ORDER — AMLODIPINE BESYLATE 10 MG PO TABS: 10.0000 mg | ORAL_TABLET | Freq: Every day | ORAL | 2 refills | Status: DC

## 2020-06-04 NOTE — Patient Instructions (Signed)
Medication Instructions:   Your physician has recommended you make the following change in your medication:   Restart amlodipine 10 mg daily  Continue other medications the same  Labwork:  None  Testing/Procedures:  None  Follow-Up:  Your physician recommends that you schedule a follow-up appointment in: 6 months  Your physician recommends that you schedule a follow-up appointment in: 2 weeks for nurse visit  Any Other Special Instructions Will Be Listed Below (If Applicable).  If you need a refill on your cardiac medications before your next appointment, please call your pharmacy.

## 2020-06-09 ENCOUNTER — Encounter (INDEPENDENT_AMBULATORY_CARE_PROVIDER_SITE_OTHER): Payer: Medicare Other | Admitting: Ophthalmology

## 2020-06-10 ENCOUNTER — Other Ambulatory Visit: Payer: Self-pay

## 2020-06-10 ENCOUNTER — Encounter (INDEPENDENT_AMBULATORY_CARE_PROVIDER_SITE_OTHER): Payer: Self-pay | Admitting: Ophthalmology

## 2020-06-10 ENCOUNTER — Ambulatory Visit (INDEPENDENT_AMBULATORY_CARE_PROVIDER_SITE_OTHER): Payer: Medicare Other | Admitting: Ophthalmology

## 2020-06-10 ENCOUNTER — Other Ambulatory Visit (HOSPITAL_COMMUNITY): Payer: Self-pay | Admitting: Podiatry

## 2020-06-10 DIAGNOSIS — L97901 Non-pressure chronic ulcer of unspecified part of unspecified lower leg limited to breakdown of skin: Secondary | ICD-10-CM

## 2020-06-10 DIAGNOSIS — E113512 Type 2 diabetes mellitus with proliferative diabetic retinopathy with macular edema, left eye: Secondary | ICD-10-CM | POA: Diagnosis not present

## 2020-06-10 NOTE — Progress Notes (Signed)
06/10/2020     CHIEF COMPLAINT Patient presents for Retina Follow Up (Focal OS. OCT/Pt denies any changes or issues./BGL: did not check)   HISTORY OF PRESENT ILLNESS: Jennifer [name] is a 64 y.o. female who presents to the clinic today for:   HPI    Retina Follow Up    Patient presents with  Diabetic Retinopathy.  In left eye.  Severity is moderate.  Duration of 2 weeks.  Since onset it is stable.  I, the attending physician,  performed the HPI with the patient and updated documentation appropriately. Additional comments: Focal OS. OCT Pt denies any changes or issues. BGL: did not check       Last edited by Tilda Franco on 06/10/2020  8:20 AM. (History)      Referring physician: [name], [name], MD [place],  [place] [id number]  HISTORICAL INFORMATION:   Selected notes from the MEDICAL RECORD NUMBER    Lab Results  Component Value Date   HGBA1C 6.5 (H) 03/29/2019     CURRENT MEDICATIONS: No current outpatient medications on file. (Ophthalmic Drugs)   No current facility-administered medications for this visit. (Ophthalmic Drugs)   Current Outpatient Medications (Other)  Medication Sig  . amLODipine (NORVASC) 10 MG tablet Take 1 tablet (10 mg total) by mouth daily.  Marland Kitchen aspirin EC 81 MG tablet Take 1 tablet (81 mg total) by mouth 2 (two) times daily. Hold aspirin while you are on apixaban/Eliquis  . atorvastatin (LIPITOR) 80 MG tablet TAKE 1 TABLET BY MOUTH ONCE DAILY AT  6:00  PM  . buPROPion (WELLBUTRIN SR) 150 MG 12 hr tablet Take 1 tablet by mouth twice daily  . carvedilol (COREG) 12.5 MG tablet TAKE 1 TABLET BY MOUTH TWICE DAILY WITH A MEAL  . DULoxetine (CYMBALTA) 60 MG capsule Take 1 capsule by mouth once daily  . ezetimibe (ZETIA) 10 MG tablet Take 10 mg by mouth daily.  . furosemide (LASIX) 40 MG tablet Take 1 tablet (40 mg total) by mouth 2 (two) times daily. 8AM, 1 PM  . insulin aspart (NOVOLOG) 100 UNIT/ML injection 100u/day  . Insulin  Human (INSULIN PUMP) SOLN Inject into the skin continuous. Patient may bolus up to 40 units at a time, with a maximum daily limit of 120 units  . levothyroxine (SYNTHROID) 137 MCG tablet Take 137 mcg by mouth daily before breakfast.  . mupirocin ointment (BACTROBAN) 2 % Apply 1 application topically 2 (two) times daily.  . nitroGLYCERIN (NITROSTAT) 0.4 MG SL tablet Place 1 tablet (0.4 mg total) under the tongue every 5 (five) minutes x 3 doses as needed for chest pain.  . sitaGLIPtin (JANUVIA) 100 MG tablet Take 100 mg by mouth daily.    No current facility-administered medications for this visit. (Other)      REVIEW OF SYSTEMS: ROS    Positive for: Endocrine   Last edited by Tilda Franco on 06/10/2020  8:20 AM. (History)       ALLERGIES Allergies  Allergen Reactions  . Metformin Hcl Other (See Comments)    PAST MEDICAL HISTORY Past Medical History:  Diagnosis Date  . CAD (coronary artery disease)    cath 5/23 100% dist RCA lesion treated with 2 overlapping Integrity Resolute DES ( 2.25x25mm, 2.25x37mm), 60% mid RCA treated medically, 99% OM2 not amenable to PCI, 70% D1 lesion, EF normal  . Hypercholesteremia   . Hypertension   . Hypothyroidism   . Stroke ([place]) 10/2018  .  Thyroid disease   . Type 2 diabetes mellitus ([place]) 10/08/2018   Past Surgical History:  Procedure Laterality Date  . ABDOMINAL HYSTERECTOMY    . CARDIAC CATHETERIZATION N/A 11/03/2015   Procedure: Left Heart Cath and Coronary Angiography;  Surgeon: Leonie Man, MD;  Location: [facility];  Service: Cardiovascular;  Laterality: N/A;  . CARDIAC CATHETERIZATION N/A 11/03/2015   Procedure: Coronary Stent Intervention;  Surgeon: Leonie Man, MD;  Location: [facility];  Service: Cardiovascular;  Laterality: N/A;  . CARPAL TUNNEL RELEASE    . CORONARY ANGIOPLASTY     1 stent  . TOTAL KNEE ARTHROPLASTY Right 04/02/2019   Procedure: TOTAL KNEE ARTHROPLASTY;  Surgeon: Carole Civil, MD;  Location: [facility];  Service: Orthopedics;  Laterality: Right;    FAMILY HISTORY Family History  Problem Relation Age of Onset  . Heart disease Mother   . Stroke Sister   . Heart attack Brother     SOCIAL HISTORY Social History   Tobacco Use  . Smoking status: Current Every Day Smoker    Packs/day: 0.25    Types: Cigarettes  . Smokeless tobacco: Never Used  Substance Use Topics  . Alcohol use: No    Alcohol/week: 0.0 standard drinks  . Drug use: No         OPHTHALMIC EXAM:  Base Eye Exam    Visual Acuity (Snellen - Linear)      Right Left   Dist cc 20/80 E Card @ 5'   Dist ph cc 20/50 -1 20/400   Correction: Glasses       Tonometry (Tonopen, 8:25 AM)      Right Left   Pressure 10 13       Neuro/Psych    Oriented x3: Yes   Mood/Affect: Normal       Dilation    Left eye: 1.0% Mydriacyl @ 8:25 AM        Slit Lamp and Fundus Exam    External Exam      Right Left   External Normal Normal       Slit Lamp Exam      Right Left   Lids/Lashes Normal Normal   Conjunctiva/Sclera White and quiet White and quiet   Cornea Clear Clear   Anterior Chamber Deep and quiet Deep and quiet   Iris Round and reactive Round and reactive   Lens Centered posterior chamber intraocular lens Centered posterior chamber intraocular lens   Anterior Vitreous Normal Normal,           IMAGING AND PROCEDURES  Imaging and Procedures for 06/10/20  OCT, Retina - OU - Both Eyes       Right Eye Quality was good. Scan locations included subfoveal. Central Foveal Thickness: 305. Findings include cystoid macular edema.   Left Eye Quality was good. Scan locations included subfoveal. Central Foveal Thickness: 374. Progression has improved. Findings include cystoid macular edema, abnormal foveal contour.   Notes Extra foveal CSME OD much improved inferonasally status post recent focal laser treatment delivered 1 month prior.  OS with center involved CSME still active  although slightly improved overall.  Will retreat with intravitreal Avastin today And consider focal laser treatment inferonasal to the fovea soon  We will treat region inferonasal to fovea which she has diminished in extent of thickening post recent injection OS       Focal Laser - OS - Left Eye       Time Out Confirmed correct  patient, procedure, site, and patient consented.   Anesthesia Topical anesthesia was used. Anesthetic medications included Proparacaine 0.5%.   Laser Information The type of laser was diode. Color was yellow. The duration in seconds was 0.1. The spot size was 100 microns. Laser power was 50. Total spots was 69.   Post-op The patient tolerated the procedure well. There were no complications. The patient received written and verbal post procedure care education.                 ASSESSMENT/PLAN:  Proliferative diabetic retinopathy of left eye with macular edema associated with type 2 diabetes mellitus (HCC) CSME improved post recent injection Avastin OS, inferonasal Avastin, grid laser will be applied to this region today  With chronic center involved CSME OS, will retreat with focal today.  We will follow up in 2 to 3 months to look for signs of improvement      ICD-10-CM   1. Proliferative diabetic retinopathy of left eye with macular edema associated with type 2 diabetes mellitus (HCC)  U04.5409 OCT, Retina - OU - Both Eyes    Focal Laser - OS - Left Eye    1.  Focal laser OS, inferonasal to the FAZ will be delivered today with a goal of long-term control and minimizing and decreasing treatment burden.  2.  Dilate OU next in 3 months  3.  Ophthalmic Meds Ordered this visit:  No orders of the defined types were placed in this encounter.      Return in about 3 months (around 09/08/2020) for DILATE OU, OCT.  There are no Patient Instructions on file for this visit.   Explained the diagnoses, plan, and follow up with the patient and  they expressed understanding.  Patient expressed understanding of the importance of proper follow up care.   Clent Demark Genevieve Ritzel M.D. Diseases & Surgery of the Retina and Vitreous Retina [facility] Diabetic [place] 06/10/20     Abbreviations: M myopia (nearsighted); A astigmatism; H hyperopia (farsighted); P presbyopia; Mrx spectacle prescription;  CTL contact lenses; OD right eye; OS left eye; OU both eyes  XT exotropia; ET esotropia; PEK punctate epithelial keratitis; PEE punctate epithelial erosions; DES dry eye syndrome; MGD meibomian gland dysfunction; ATs artificial tears; PFAT's preservative free artificial tears; [place] nuclear sclerotic cataract; PSC posterior subcapsular cataract; ERM epi-retinal membrane; PVD posterior vitreous detachment; RD retinal detachment; DM diabetes mellitus; DR diabetic retinopathy; NPDR non-proliferative diabetic retinopathy; PDR proliferative diabetic retinopathy; CSME clinically significant macular edema; DME diabetic macular edema; dbh dot blot hemorrhages; CWS cotton wool spot; POAG primary open angle glaucoma; C/D cup-to-disc ratio; HVF humphrey visual field; GVF goldmann visual field; OCT optical coherence tomography; IOP intraocular pressure; BRVO Branch retinal vein occlusion; CRVO central retinal vein occlusion; CRAO central retinal artery occlusion; BRAO branch retinal artery occlusion; RT retinal tear; SB scleral buckle; PPV pars plana vitrectomy; VH Vitreous hemorrhage; PRP panretinal laser photocoagulation; IVK intravitreal kenalog; VMT vitreomacular traction; MH Macular hole;  NVD neovascularization of the disc; NVE neovascularization elsewhere; AREDS age related eye disease study; ARMD age related macular degeneration; POAG primary open angle glaucoma; EBMD epithelial/anterior basement membrane dystrophy; ACIOL anterior chamber intraocular lens; IOL intraocular lens; PCIOL posterior chamber intraocular lens; Phaco/IOL phacoemulsification with intraocular lens  placement; [place] photorefractive keratectomy; LASIK laser assisted in situ keratomileusis; HTN hypertension; DM diabetes mellitus; COPD chronic obstructive pulmonary disease

## 2020-06-10 NOTE — Assessment & Plan Note (Addendum)
CSME improved post recent injection Avastin OS, inferonasal Avastin, grid laser will be applied to this region today  With chronic center involved CSME OS, will retreat with focal today.  We will follow up in 2 to 3 months to look for signs of improvement

## 2020-06-13 ENCOUNTER — Other Ambulatory Visit: Payer: Self-pay | Admitting: Family Medicine

## 2020-06-18 ENCOUNTER — Encounter: Payer: Self-pay | Admitting: *Deleted

## 2020-06-18 ENCOUNTER — Ambulatory Visit (INDEPENDENT_AMBULATORY_CARE_PROVIDER_SITE_OTHER): Payer: Medicare Other | Admitting: *Deleted

## 2020-06-18 VITALS — BP 150/70 | HR 68 | Ht 67.0 in | Wt 283.8 lb

## 2020-06-18 DIAGNOSIS — I10 Essential (primary) hypertension: Secondary | ICD-10-CM

## 2020-06-18 NOTE — Progress Notes (Signed)
Presents for nurse visit to have BP rechecked per last office visit. Denies dizziness, chest pain or SOB. Medications and allergies reviewed and vitals taken. Reports taking all doses of medications as prescribed without missing doses.

## 2020-06-19 ENCOUNTER — Ambulatory Visit: Payer: Medicare Other | Admitting: Podiatry

## 2020-06-19 ENCOUNTER — Other Ambulatory Visit: Payer: Self-pay

## 2020-06-19 ENCOUNTER — Ambulatory Visit (HOSPITAL_COMMUNITY)
Admission: RE | Admit: 2020-06-19 | Discharge: 2020-06-19 | Disposition: A | Discharge status: Home / Self Care | Payer: Medicare Other | Source: Ambulatory Visit | Attending: Cardiology | Admitting: Cardiology

## 2020-06-19 ENCOUNTER — Telehealth: Payer: Self-pay | Admitting: *Deleted

## 2020-06-19 ENCOUNTER — Encounter: Payer: Self-pay | Admitting: *Deleted

## 2020-06-19 ENCOUNTER — Encounter: Payer: Self-pay | Admitting: Family Medicine

## 2020-06-19 DIAGNOSIS — L97901 Non-pressure chronic ulcer of unspecified part of unspecified lower leg limited to breakdown of skin: Secondary | ICD-10-CM | POA: Diagnosis present

## 2020-06-19 DIAGNOSIS — Q6672 Congenital pes cavus, left foot: Secondary | ICD-10-CM | POA: Diagnosis not present

## 2020-06-19 DIAGNOSIS — E1142 Type 2 diabetes mellitus with diabetic polyneuropathy: Secondary | ICD-10-CM | POA: Diagnosis not present

## 2020-06-19 DIAGNOSIS — L97423 Non-pressure chronic ulcer of left heel and midfoot with necrosis of muscle: Secondary | ICD-10-CM | POA: Diagnosis not present

## 2020-06-19 DIAGNOSIS — Z794 Long term (current) use of insulin: Secondary | ICD-10-CM

## 2020-06-19 DIAGNOSIS — I10 Essential (primary) hypertension: Secondary | ICD-10-CM

## 2020-06-19 MED ORDER — LOSARTAN POTASSIUM 25 MG PO TABS: 12.5000 mg | ORAL_TABLET | Freq: Every day | ORAL | 3 refills | Status: DC

## 2020-06-19 NOTE — Telephone Encounter (Signed)
Verta Ellen., NP  Merlene Laughter, RN Blood pressure still not at goal. Ask her if she would consider starting losartan 12.5 mg daily. If so, please send in and and get a basic metabolic panel in 2 weeks and have her come back for another blood pressure check at that point. Thank you   Patient informed and verbalized understanding of plan. Lab order faxed to [facility]

## 2020-06-25 ENCOUNTER — Ambulatory Visit (INDEPENDENT_AMBULATORY_CARE_PROVIDER_SITE_OTHER): Payer: Medicare Other | Admitting: Podiatry

## 2020-06-25 ENCOUNTER — Other Ambulatory Visit: Payer: Self-pay

## 2020-06-25 DIAGNOSIS — L97423 Non-pressure chronic ulcer of left heel and midfoot with necrosis of muscle: Secondary | ICD-10-CM

## 2020-06-25 DIAGNOSIS — Q6672 Congenital pes cavus, left foot: Secondary | ICD-10-CM

## 2020-06-25 DIAGNOSIS — Z87891 Personal history of nicotine dependence: Secondary | ICD-10-CM

## 2020-06-25 DIAGNOSIS — M898X7 Other specified disorders of bone, ankle and foot: Secondary | ICD-10-CM

## 2020-06-25 DIAGNOSIS — Z794 Long term (current) use of insulin: Secondary | ICD-10-CM

## 2020-06-25 DIAGNOSIS — E1142 Type 2 diabetes mellitus with diabetic polyneuropathy: Secondary | ICD-10-CM

## 2020-06-25 DIAGNOSIS — M71372 Other bursal cyst, left ankle and foot: Secondary | ICD-10-CM

## 2020-06-28 ENCOUNTER — Encounter: Payer: Self-pay | Admitting: Podiatry

## 2020-06-28 NOTE — Progress Notes (Signed)
Subjective:  Patient ID: Jennifer [name], female    DOB: February 02, 1956,  MRN: [id number]  Chief Complaint  Patient presents with  . Foot Ulcer    Left foot ulcer. PT stated that she is still having pain but has no concerns at this time.    65 y.o. female returns with the above complaint. History confirmed with patient.  Vascular testing was completed.  She is ready to proceed with surgery.  Objective:  Physical Exam: She has +2 DP pulses.  I am unable to palpate the PT pulse.  She has moderate edema in the left lower extremity.  Absent protective sensation.  Granuloma and ulcer has healed  Radiographs: X-ray of the left foot: Moderate to severe cavus foot type with evidence of prior fifth and fourth metatarsal fractures with healed callus.  No osseous abnormalities in the area of the lesion  MRI 02/11/2020:Addendum    ADDENDUM REPORT: 02/27/2020 17:03  ADDENDUM: Upon further review, there is an ill-defined complex soft tissue lesion at the plantar aspect of the fifth metatarsal base (series 4, image 19; series 5, image 44). This measures approximately 1.3 x 3.1 x 1.2 cm. The lesion is heterogeneous on T2 imaging with areas of increased T2 signal likely representing fluid as well as internal areas of T2 low signal. The lesion is relatively T1 isointense and demonstrates some incomplete rim enhancement at the margins (series 10, images 43-44). There is no significant surrounding soft tissue swelling.  This is nonspecific and could represent adventitial bursitis, chronic hematoma, or less likely chronic infection.  These results were discussed by telephone on 02/27/2020 at 12:00 pm with provider [facility] PATEL , who verbally acknowledged these results.   Electronically Signed   By: Titus Dubin M.D.   On: 02/27/2020 17:03   ABI/PVRs 06/19/2020: Summary:  Right: Resting right ankle-brachial index is within normal range. No  evidence of significant right lower  extremity arterial disease. The right  toe-brachial index is abnormal.   Left: Resting left ankle-brachial index is within normal range. No  evidence of significant left lower extremity arterial disease. The left  toe-brachial index is normal.  Assessment:   1. 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 Other bursal cyst, left ankle and foot   3. Exostosis of left foot   4. Pes cavus of left foot   5. Diabetic peripheral neuropathy ([place])   6. Type 2 diabetes mellitus with diabetic polyneuropathy, with long-term current use of insulin ([place])   7. Former smoker      Plan:  Patient was evaluated and treated and all questions answered.   -Vascular testing reviewed with patient.  No evidence of peripheral arterial disease -Cleared by cardiology to proceed with surgery  Ulcer left foot -Discussed further treatment options including surgical intervention or continue local wound care.  I do not think this will improve without exostectomy beneath the wound.  Recommend we proceed with surgical excision of the lesion with exostectomy and excision of the bursal sac.  We discussed all risk, benefits and potential complications as well as expected postoperative course.  Discussed with her that her biggest risk would be wound infection following surgery.  Allow her to partially weight-bear in a CAM boot protected but discussed with her that she should rest as much as possible for 2 to 3 weeks after surgery.  She understands and wishes to proceed.  We discussed all risks including not limited to pain, swelling, infection,  scar, numbness which may be temporary or permanent, chronic pain, stiffness, nerve pain or damage, wound healing problems.  Informed consent was signed and reviewed in the office today   Surgical plan:  Procedure: -Left foot wound excision, exostectomy and excision of bursal sac  Location: -[facility]  Anesthesia plan: -IV sedation with  local anesthesia  Postoperative pain plan: - Tylenol 1000 mg every 6 hours, ibuprofen 600 mg every 6 hours, gabapentin 300 mg every 8 hours x5 days, oxycodone 5 mg 1-2 tabs every 6 hours only as needed  DVT prophylaxis: -None required should be WBAT in a CAM boot  WB Restrictions / DME needs: -WBAT in a CAM boot which was dispensed today       No follow-ups on file.

## 2020-06-29 ENCOUNTER — Other Ambulatory Visit: Payer: Self-pay | Admitting: Cardiovascular Disease

## 2020-08-09 ENCOUNTER — Other Ambulatory Visit: Payer: Self-pay | Admitting: Cardiovascular Disease

## 2020-08-10 ENCOUNTER — Telehealth: Payer: Self-pay

## 2020-08-10 NOTE — Telephone Encounter (Signed)
Pt called today and has a few questions about her surgery that's schedule for Friday. Please advise

## 2020-08-13 NOTE — Telephone Encounter (Signed)
Spoke with the patient and all questions addressed

## 2020-08-20 ENCOUNTER — Encounter: Payer: Medicare Other | Admitting: Podiatry

## 2020-08-27 ENCOUNTER — Encounter: Payer: Medicare Other | Admitting: Podiatry

## 2020-09-07 ENCOUNTER — Encounter (INDEPENDENT_AMBULATORY_CARE_PROVIDER_SITE_OTHER): Payer: Medicare Other | Admitting: Ophthalmology

## 2020-09-10 ENCOUNTER — Encounter: Payer: Medicare Other | Admitting: Podiatry

## 2020-09-17 ENCOUNTER — Ambulatory Visit (INDEPENDENT_AMBULATORY_CARE_PROVIDER_SITE_OTHER): Payer: Medicare Other | Admitting: Podiatry

## 2020-09-17 ENCOUNTER — Other Ambulatory Visit: Payer: Self-pay

## 2020-09-17 DIAGNOSIS — M898X7 Other specified disorders of bone, ankle and foot: Secondary | ICD-10-CM

## 2020-09-17 DIAGNOSIS — M71372 Other bursal cyst, left ankle and foot: Secondary | ICD-10-CM | POA: Diagnosis not present

## 2020-09-17 DIAGNOSIS — L97423 Non-pressure chronic ulcer of left heel and midfoot with necrosis of muscle: Secondary | ICD-10-CM

## 2020-09-17 DIAGNOSIS — Z794 Long term (current) use of insulin: Secondary | ICD-10-CM

## 2020-09-17 DIAGNOSIS — E1142 Type 2 diabetes mellitus with diabetic polyneuropathy: Secondary | ICD-10-CM

## 2020-09-17 DIAGNOSIS — Q6672 Congenital pes cavus, left foot: Secondary | ICD-10-CM

## 2020-09-17 NOTE — Patient Instructions (Signed)
Surgery is scheduled for May 13th. Have Dr Chalmers Cater fax your lab work to my office for review when you see her

## 2020-09-18 ENCOUNTER — Encounter: Payer: Self-pay | Admitting: Podiatry

## 2020-09-18 NOTE — Progress Notes (Signed)
Subjective:  Patient ID: Jennifer [name], female    DOB: 07-Jun-1956,  MRN: [id number]  No chief complaint on file.   65 y.o. female returns for follow-up and to reschedule her surgery which we canceled the day of due to hyperglycemia, she has had much better glycemic control since last visit in the mid 100s now that she has all her supplies.  Objective:  Physical Exam: She has +2 DP pulses.  I am unable to palpate the PT pulse.  She has moderate edema in the left lower extremity.  Absent protective sensation.  Subcutaneous mass present fifth metatarsal base protruding from foot, benign-appearing  Radiographs: X-ray of the left foot: Moderate to severe cavus foot type with evidence of prior fifth and fourth metatarsal fractures with healed callus.  No osseous abnormalities in the area of the lesion  MRI 02/11/2020:Addendum    ADDENDUM REPORT: 02/27/2020 17:03  ADDENDUM: Upon further review, there is an ill-defined complex soft tissue lesion at the plantar aspect of the fifth metatarsal base (series 4, image 19; series 5, image 44). This measures approximately 1.3 x 3.1 x 1.2 cm. The lesion is heterogeneous on T2 imaging with areas of increased T2 signal likely representing fluid as well as internal areas of T2 low signal. The lesion is relatively T1 isointense and demonstrates some incomplete rim enhancement at the margins (series 10, images 43-44). There is no significant surrounding soft tissue swelling.  This is nonspecific and could represent adventitial bursitis, chronic hematoma, or less likely chronic infection.  These results were discussed by telephone on 02/27/2020 at 12:00 pm with provider [facility] PATEL , who verbally acknowledged these results.   Electronically Signed   By: Titus Dubin M.D.   On: 02/27/2020 17:03   ABI/PVRs 06/19/2020: Summary:  Right: Resting right ankle-brachial index is within normal range. No  evidence of significant right lower  extremity arterial disease. The right  toe-brachial index is abnormal.   Left: Resting left ankle-brachial index is within normal range. No  evidence of significant left lower extremity arterial disease. The left  toe-brachial index is normal.  Assessment:   1. 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 Other bursal cyst, left ankle and foot   3. Exostosis of left foot   4. Pes cavus of left foot   5. Diabetic peripheral neuropathy ([place])   6. Type 2 diabetes mellitus with diabetic polyneuropathy, with long-term current use of insulin ([place])      Plan:  Patient was evaluated and treated and all questions answered.   -We again discussed surgical correction, she is still interested in having this addressed.  Her blood sugar has been under much better control.  She is due to see her endocrinologist in the next couple weeks and she will send her hemoglobin A1c to me.  We have rescheduled her for the middle to end of May and this will allow time to have her labs checked once more.  Again discussed the risk, benefits emotional complications or could arise from this including wound infection, failure to heal of incision, recurrent ulceration or bone infection or amputation.  She understands and wishes to proceed.  Informed consent has already been signed and is On file.   Surgical plan:  Procedure: -Left foot wound excision, exostectomy and excision of bursal sac  Location: -[facility]  Anesthesia plan: -IV sedation with local anesthesia  Postoperative pain plan: - Tylenol 1000 mg every 6 hours, ibuprofen 600  mg every 6 hours, gabapentin 300 mg every 8 hours x5 days, oxycodone 5 mg 1-2 tabs every 6 hours only as needed  DVT prophylaxis: -None required should be WBAT in a CAM boot  WB Restrictions / DME needs: -WBAT in a CAM boot which was dispensed today       No follow-ups on file.

## 2020-10-29 ENCOUNTER — Encounter: Payer: Medicare Other | Admitting: Podiatry

## 2020-11-04 ENCOUNTER — Telehealth: Payer: Self-pay

## 2020-11-04 NOTE — Telephone Encounter (Signed)
Jennifer [name] called to cancel her surgery with Dr. Sherryle Lis on 11/06/2020. She stated she went to see her diabetic dr and they suggested she cancel her surgery for at least 4 months. Her A1c was 10. I told her to call us back when her A1c was lower and we could get her reschedule. Notified Dr. Sherryle Lis and Caren Griffins with [facility].

## 2020-11-10 ENCOUNTER — Encounter: Payer: Medicare Other | Admitting: Podiatry

## 2020-11-12 ENCOUNTER — Encounter: Payer: Medicare Other | Admitting: Podiatry

## 2020-11-24 ENCOUNTER — Encounter: Payer: Medicare Other | Admitting: Podiatry

## 2020-12-15 ENCOUNTER — Encounter: Payer: Medicare Other | Admitting: Podiatry

## 2020-12-18 ENCOUNTER — Other Ambulatory Visit: Payer: Self-pay | Admitting: Family Medicine

## 2020-12-22 NOTE — Telephone Encounter (Signed)
Last OV was on 03/13/20 for surgical Clearance. Cymbalta last filled on 05/12/20 #90 caps with 1 refills. Wellbutrin last filled on 06/16/20 #180 tabs with 1 refills

## 2021-01-22 ENCOUNTER — Other Ambulatory Visit: Payer: Self-pay | Admitting: Student

## 2021-01-28 ENCOUNTER — Other Ambulatory Visit: Payer: Self-pay | Admitting: Student

## 2021-02-12 ENCOUNTER — Other Ambulatory Visit: Payer: Self-pay | Admitting: Cardiovascular Disease

## 2021-02-12 ENCOUNTER — Other Ambulatory Visit: Payer: Self-pay | Admitting: Internal Medicine

## 2021-02-17 NOTE — Telephone Encounter (Signed)
Pt is due for a AWV/ part 2 appt, please schedule appt or at least a med refill f/u, once scheduled please route back to me to refill med, thanks

## 2021-02-17 NOTE — Telephone Encounter (Signed)
Called pt to schedule appt. Pt did not answer so I left a VM.

## 2021-02-20 ENCOUNTER — Telehealth: Payer: Self-pay | Admitting: Family Medicine

## 2021-02-23 ENCOUNTER — Encounter: Payer: Self-pay | Admitting: Family Medicine

## 2021-02-23 NOTE — Telephone Encounter (Signed)
2nd attempt  LMTCB to schedule appts also mychart was sent

## 2021-02-25 ENCOUNTER — Other Ambulatory Visit: Payer: Self-pay | Admitting: Internal Medicine

## 2021-02-25 ENCOUNTER — Other Ambulatory Visit: Payer: Self-pay | Admitting: Family Medicine

## 2021-02-25 NOTE — Telephone Encounter (Signed)
Multiple attempts have been made to try and reach pt to schedule an appt, a letter was also mailed to pt. Will await pt's return call to schedule an appt before any meds are filled

## 2021-03-12 ENCOUNTER — Telehealth: Payer: Self-pay

## 2021-03-12 MED ORDER — CARVEDILOL 12.5 MG PO TABS: 12.5000 mg | ORAL_TABLET | Freq: Two times a day (BID) | ORAL | 0 refills | Status: DC

## 2021-03-12 NOTE — Telephone Encounter (Signed)
Medication refill request for Coreg 12.5 mg tablets approved and sent to [facility]. Pt has appt with Lawanda Cousins, NP on 10/10.

## 2021-03-21 NOTE — Progress Notes (Signed)
Cardiology Office Note  Date: 03/22/2021   ID: Jennifer [name], Jennifer [name] [birth year], MRN [id number]  PCP:  Abner Greenspan, MD  Cardiologist:  None Electrophysiologist:  None   Chief Complaint: Follow-up CAD, HTN, HLD, obesity  History of Present Illness: Jennifer [name] is a 65 y.o. female with a history of CAD (status post PCI to the RCA 2017), HLD, HTN, obesity, CVA, thyroid disease, DM 2, venous insufficiency, lower extremity edema.  Last seen by Eleonore Chiquito 04/01/2019 for preoperative cardiovascular examination.  She denied any symptoms of angina or shortness of breath with activity.  Stated she could walk all day and have no anginal symptoms or shortness of breath.  She was limited by knee pain which was pending replacement.  Main limitations were due to arthritic issues.  She is here today for 32-monthfollow-up.  She denies any recent issues other than some continued lower extremity edema which is chronic for her.  She states she recently ran out of her Lasix.  States her blood sugar is not well controlled recently with a hemoglobin A1c of 10% per her statement.  Blood pressure is elevated today on arrival at 144/88.  She states at home her blood pressures usually stay around 130/80.  She denies any anginal or exertional symptoms, PND, orthopnea, SOB, DOE.  She states she is not very active on a daily basis.   Past Medical History:  Diagnosis Date   CAD (coronary artery disease)    cath 5/23 100% dist RCA lesion treated with 2 overlapping Integrity Resolute DES ( 2.25x169m 2.25x1245m 60% mid RCA treated medically, 99% OM2 not amenable to PCI, 70% D1 lesion, EF normal   Hypercholesteremia    Hypertension    Hypothyroidism    [place]   Thyroid disease    Type 2 diabetes mellitus ([facility]/2020    Past Surgical History:  Procedure Laterality Date   ABDOMINAL HYSTERECTOMY     CARDIAC CATHETERIZATION N/A 11/03/2015   Procedure: Left Heart Cath and Coronary  Angiography;  Surgeon: DavLeonie ManD;  Location: [facility];  Service: Cardiovascular;  Laterality: N/A;   CARDIAC CATHETERIZATION N/A 11/03/2015   Procedure: Coronary Stent Intervention;  Surgeon: DavLeonie ManD;  Location: [facility];  Service: Cardiovascular;  Laterality: N/A;   CARPAL TUNNEL RELEASE     CORONARY ANGIOPLASTY     1 stent   TOTAL KNEE ARTHROPLASTY Right 04/02/2019   Procedure: TOTAL KNEE ARTHROPLASTY;  Surgeon: HarCarole CivilD;  Location: [facility];  Service: Orthopedics;  Laterality: Right;    Current Outpatient Medications  Medication Sig Dispense Refill   aspirin EC 81 MG tablet Take 1 tablet (81 mg total) by mouth 2 (two) times daily. Hold aspirin while you are on apixaban/Eliquis 1 tablet 0   buPROPion (WELLBUTRIN SR) 150 MG 12 hr tablet Take 1 tablet by mouth twice daily 180 tablet 1   DULoxetine (CYMBALTA) 60 MG capsule Take 1 capsule by mouth once daily 90 capsule 1   insulin aspart (NOVOLOG) 100 UNIT/ML injection 100u/day     Insulin Human (INSULIN PUMP) SOLN Inject into the skin continuous. Patient may bolus up to 40 units at a time, with a maximum daily limit of 120 units     levothyroxine (SYNTHROID) 137 MCG tablet Take 137 mcg by mouth daily before breakfast.     mupirocin ointment (BACTROBAN) 2 % Apply 1 application topically 2 (two) times daily. 30 g 2  nitroGLYCERIN (NITROSTAT) 0.4 MG SL tablet Place 1 tablet (0.4 mg total) under the tongue every 5 (five) minutes x 3 doses as needed for chest pain. 25 tablet 3   amLODipine (NORVASC) 10 MG tablet Take 1 tablet (10 mg total) by mouth daily. 90 tablet 2   atorvastatin (LIPITOR) 80 MG tablet TAKE 1 TABLET BY MOUTH ONCE DAILY 6 IN THE EVENING 90 tablet 2   carvedilol (COREG) 12.5 MG tablet Take 1 tablet (12.5 mg total) by mouth 2 (two) times daily with a meal. 180 tablet 2   ezetimibe (ZETIA) 10 MG tablet Take 1 tablet (10 mg total) by mouth daily. 90 tablet 2   furosemide (LASIX)  40 MG tablet Take 1 tablet (40 mg total) by mouth 2 (two) times daily. 8AM, 1 PM 180 tablet 2   losartan (COZAAR) 25 MG tablet Take 0.5 tablets (12.5 mg total) by mouth daily. 45 tablet 2   sitaGLIPtin (JANUVIA) 100 MG tablet Take 100 mg by mouth daily.  (Patient not taking: Reported on 03/22/2021)     No current facility-administered medications for this visit.   Allergies:  Metformin hcl   Social History: The patient  reports that she has been smoking cigarettes. She has been smoking an average of .25 packs per day. She has never used smokeless tobacco. She reports that she does not drink alcohol and does not use drugs.   Family History: The patient's family history includes Heart attack in her brother; Heart disease in her mother; Stroke in her sister.   ROS:  Please see the history of present illness. Otherwise, complete review of systems is positive for none.  All other systems are reviewed and negative.   Physical Exam: VS:  BP (!) 144/88   Pulse 82   Ht '5\' 7"'$  (1.702 m)   Wt 271 lb (122.9 kg)   SpO2 95%   BMI 42.44 kg/m , BMI Body mass index is 42.44 kg/m.  Wt Readings from Last 3 Encounters:  03/22/21 271 lb (122.9 kg)  06/18/20 283 lb 12.8 oz (128.7 kg)  06/04/20 279 lb 3.2 oz (126.6 kg)    General: Morbidly obese patient appears comfortable at rest. Neck: Supple, no elevated JVP or carotid bruits, no thyromegaly. Lungs: Clear to auscultation, nonlabored breathing at rest. Cardiac: Regular rate and rhythm, no S3 or significant systolic murmur, no pericardial rub. Extremities: Mild pitting edema, distal pulses 2+. Skin: Warm and dry. Musculoskeletal: No kyphosis. Neuropsychiatric: Alert and oriented x3, affect grossly appropriate.  ECG:  An ECG dated 06-04-2020 was personally reviewed today and demonstrated:  Normal sinus rhythm rate of 77.  Recent Labwork: 05/20/2020: Hemoglobin 11.6; Platelets 276     Component Value Date/Time   CHOL 151 03/13/2020 1234   TRIG  96.0 03/13/2020 1234   HDL 48.80 03/13/2020 1234   CHOLHDL 3 03/13/2020 1234   VLDL 19.2 03/13/2020 1234   LDLCALC 83 03/13/2020 1234   LDLDIRECT 154.2 11/04/2011 1018    Other Studies Reviewed Today:    TTE 10/10/2018  1. The left ventricle has normal systolic function, with an ejection fraction of 55-60%. The cavity size was normal. Left ventricular diastolic Doppler parameters are consistent with impaired relaxation. No evidence of left ventricular regional wall  motion abnormalities.  2. Moderate hypokinesis of the left ventricular, basal inferior wall.  3. The right ventricle has normal systolic function. The cavity was normal. There is no increase in right ventricular wall thickness. Right ventricular systolic pressure normal with an estimated  pressure of 32.0 mmHg.  4. The aortic valve is tricuspid. Mild aortic annular calcification noted.  5. The mitral valve is grossly normal. There is mild mitral annular calcification present.  6. The tricuspid valve is grossly normal.  7. The aortic root is normal in size and structure.  8. There is aneurysmal motion of the interatrial septum with evidence of PFO by agitated saline injection, right to left shunting seen intermittently.   [facility] 11/04/2015 Dist RCA lesion, 100% stenosed. Post intervention - PTCA followed by PCI with 2 overlapping Integrity Resolute DES Stents (2.25 mm x 14 mm, 2.25 mm x 12 mm), there is a 0% residual stenosis. Mid RCA lesion, 60% stenosed. Not flow-limiting. The decision was made to treat this medically. 2nd Mrg lesion, 99% stenosed. Prior to visualization of the distal RCA occlusion, this was thought to be a potential culprit, however not PCI amenable. Lat 1st Mrg lesion, 55% stenosed. 1st Diag lesion, 70% stenosed. Preserved LVEF with normal LVEDP The left ventricular systolic function is normal.     Assessment and Plan:  1. CAD in native artery   2. Essential hypertension   3. Hyperlipidemia LDL goal <70     1.  CAD in native artery Previous cardiac catheterization 2017: DES to 100% RCA lesion x2 with 0% residual stenosis.  Mid RCA lesion 60% not flow-limiting treating medically.  Second marginal lesion 99% not PCI amenable.  Lateral first marginal 55%, first diagonal 70%.  No recent anginal or exertional symptoms.  Continue aspirin 81 mg, carvedilol 12.5 mg p.o. twice daily.  Nitroglycerin sublingual 0.4 mg as needed  2.  Essential hypertension Blood pressure elevated at 144/88.  Patient states blood pressure at home generally 130s over 80s.  She states she has been without her Lasix for the last couple weeks.  We will refill all of her cardiac medications per her request.  Continue amlodipine 10 mg daily.  Continue carvedilol 12.5 mg p.o. twice daily. Lasix 40 mg p.o. twice daily.  Continue losartan 12.5 mg p.o. daily  3.  Hyperlipidemia LDL goal <70 Last lipid panel on 10/10/2018: TC 173, TG 142, HDL 37, LDL 108.  Continue atorvastatin 80 mg daily.  Continue Zetia 10 mg daily.     Medication Adjustments/Labs and Tests Ordered: Current medicines are reviewed at length with the patient today.  Concerns regarding medicines are outlined above.   Disposition: Follow-up with Dr Harl Bowie or APP 6 months.  Signed, Levell July, NP 03/22/2021 9:03 AM    [facility], [place], [place] [address] Phone: [phone]; Fax: [phone]

## 2021-03-22 ENCOUNTER — Ambulatory Visit (INDEPENDENT_AMBULATORY_CARE_PROVIDER_SITE_OTHER): Payer: Medicare Other | Admitting: Family Medicine

## 2021-03-22 ENCOUNTER — Encounter: Payer: Self-pay | Admitting: Family Medicine

## 2021-03-22 VITALS — BP 144/88 | HR 82 | Ht 67.0 in | Wt 271.0 lb

## 2021-03-22 DIAGNOSIS — I2510 Atherosclerotic heart disease of native coronary artery without angina pectoris: Secondary | ICD-10-CM

## 2021-03-22 DIAGNOSIS — E785 Hyperlipidemia, unspecified: Secondary | ICD-10-CM

## 2021-03-22 DIAGNOSIS — I10 Essential (primary) hypertension: Secondary | ICD-10-CM | POA: Diagnosis not present

## 2021-03-22 MED ORDER — ATORVASTATIN CALCIUM 80 MG PO TABS: ORAL_TABLET | ORAL | 2 refills | Status: DC

## 2021-03-22 MED ORDER — LOSARTAN POTASSIUM 25 MG PO TABS: 12.5000 mg | ORAL_TABLET | Freq: Every day | ORAL | 2 refills | Status: DC

## 2021-03-22 MED ORDER — FUROSEMIDE 40 MG PO TABS: 40.0000 mg | ORAL_TABLET | Freq: Two times a day (BID) | ORAL | 2 refills | Status: DC

## 2021-03-22 MED ORDER — CARVEDILOL 12.5 MG PO TABS: 12.5000 mg | ORAL_TABLET | Freq: Two times a day (BID) | ORAL | 2 refills | Status: DC

## 2021-03-22 MED ORDER — AMLODIPINE BESYLATE 10 MG PO TABS: 10.0000 mg | ORAL_TABLET | Freq: Every day | ORAL | 2 refills | Status: DC

## 2021-03-22 MED ORDER — EZETIMIBE 10 MG PO TABS: 10.0000 mg | ORAL_TABLET | Freq: Every day | ORAL | 2 refills | Status: DC

## 2021-03-22 NOTE — Patient Instructions (Signed)
Medication Instructions:  Your physician recommends that you continue on your current medications as directed. Please refer to the Current Medication list given to you today.  *If you need a refill on your cardiac medications before your next appointment, please call your pharmacy*   Lab Work: None If you have labs (blood work) drawn today and your tests are completely normal, you will receive your results only by: [place] (if you have MyChart) OR A paper copy in the mail If you have any lab test that is abnormal or we need to change your treatment, we will call you to review the results.   Testing/Procedures: None   Follow-Up: At [facility], you and your health needs are our priority.  As part of our continuing mission to provide you with exceptional heart care, we have created designated Provider Care Teams.  These Care Teams include your primary Cardiologist (physician) and Advanced Practice Providers (APPs -  Physician Assistants and Nurse Practitioners) who all work together to provide you with the care you need, when you need it.  We recommend signing up for the patient portal called "MyChart".  Sign up information is provided on this After Visit Summary.  MyChart is used to connect with patients for Virtual Visits (Telemedicine).  Patients are able to view lab/test results, encounter notes, upcoming appointments, etc.  Non-urgent messages can be sent to your provider as well.   To learn more about what you can do with MyChart, go to [email or web address].    Your next appointment:   6 month(s)  The format for your next appointment:   In Person  Provider:   Katina Dung, NP   Other Instructions

## 2021-06-15 ENCOUNTER — Other Ambulatory Visit: Payer: Self-pay | Admitting: *Deleted

## 2021-06-15 MED ORDER — LOSARTAN POTASSIUM 25 MG PO TABS: 12.5000 mg | ORAL_TABLET | Freq: Every day | ORAL | 1 refills | Status: DC

## 2021-07-11 ENCOUNTER — Other Ambulatory Visit: Payer: Self-pay | Admitting: Family Medicine

## 2021-07-12 ENCOUNTER — Other Ambulatory Visit: Payer: Self-pay

## 2021-07-12 ENCOUNTER — Encounter (HOSPITAL_COMMUNITY): Payer: Medicare Other

## 2021-07-12 ENCOUNTER — Ambulatory Visit (INDEPENDENT_AMBULATORY_CARE_PROVIDER_SITE_OTHER): Payer: Medicare Other

## 2021-07-12 ENCOUNTER — Ambulatory Visit (INDEPENDENT_AMBULATORY_CARE_PROVIDER_SITE_OTHER): Payer: Medicare Other | Admitting: Podiatry

## 2021-07-12 DIAGNOSIS — E1142 Type 2 diabetes mellitus with diabetic polyneuropathy: Secondary | ICD-10-CM | POA: Diagnosis not present

## 2021-07-12 DIAGNOSIS — Z794 Long term (current) use of insulin: Secondary | ICD-10-CM

## 2021-07-12 DIAGNOSIS — Q6672 Congenital pes cavus, left foot: Secondary | ICD-10-CM | POA: Diagnosis not present

## 2021-07-12 DIAGNOSIS — M79675 Pain in left toe(s): Secondary | ICD-10-CM | POA: Diagnosis not present

## 2021-07-12 DIAGNOSIS — B351 Tinea unguium: Secondary | ICD-10-CM | POA: Diagnosis not present

## 2021-07-12 DIAGNOSIS — M7989 Other specified soft tissue disorders: Secondary | ICD-10-CM

## 2021-07-12 DIAGNOSIS — M79674 Pain in right toe(s): Secondary | ICD-10-CM | POA: Diagnosis not present

## 2021-07-12 DIAGNOSIS — M898X7 Other specified disorders of bone, ankle and foot: Secondary | ICD-10-CM

## 2021-07-12 MED ORDER — DOXYCYCLINE HYCLATE 100 MG PO TABS: 100.0000 mg | ORAL_TABLET | Freq: Two times a day (BID) | ORAL | 0 refills | Status: DC

## 2021-07-12 NOTE — Telephone Encounter (Signed)
Pt has f/u appt scheduled tomorrow 07/13/21 to discuss memory.  Cymbalta last filled on 12/22/20 #90 with 1 refill Wellbutrin last filled on 12/22/20 #180 tab with 1 refill

## 2021-07-13 ENCOUNTER — Encounter: Payer: Self-pay | Admitting: Family Medicine

## 2021-07-13 ENCOUNTER — Other Ambulatory Visit: Payer: Self-pay | Admitting: Family Medicine

## 2021-07-13 ENCOUNTER — Ambulatory Visit (INDEPENDENT_AMBULATORY_CARE_PROVIDER_SITE_OTHER): Payer: Medicare Other | Admitting: Family Medicine

## 2021-07-13 ENCOUNTER — Telehealth: Payer: Self-pay | Admitting: Family Medicine

## 2021-07-13 VITALS — BP 144/76 | HR 85 | Temp 96.9°F | Ht 67.0 in | Wt 257.5 lb

## 2021-07-13 DIAGNOSIS — R413 Other amnesia: Secondary | ICD-10-CM

## 2021-07-13 DIAGNOSIS — N3000 Acute cystitis without hematuria: Secondary | ICD-10-CM

## 2021-07-13 DIAGNOSIS — R410 Disorientation, unspecified: Secondary | ICD-10-CM

## 2021-07-13 DIAGNOSIS — D649 Anemia, unspecified: Secondary | ICD-10-CM | POA: Insufficient documentation

## 2021-07-13 DIAGNOSIS — F4323 Adjustment disorder with mixed anxiety and depressed mood: Secondary | ICD-10-CM

## 2021-07-13 DIAGNOSIS — R519 Headache, unspecified: Secondary | ICD-10-CM | POA: Insufficient documentation

## 2021-07-13 DIAGNOSIS — R269 Unspecified abnormalities of gait and mobility: Secondary | ICD-10-CM

## 2021-07-13 DIAGNOSIS — N184 Chronic kidney disease, stage 4 (severe): Secondary | ICD-10-CM | POA: Insufficient documentation

## 2021-07-13 DIAGNOSIS — E1142 Type 2 diabetes mellitus with diabetic polyneuropathy: Secondary | ICD-10-CM | POA: Diagnosis not present

## 2021-07-13 DIAGNOSIS — N186 End stage renal disease: Secondary | ICD-10-CM | POA: Insufficient documentation

## 2021-07-13 DIAGNOSIS — G44209 Tension-type headache, unspecified, not intractable: Secondary | ICD-10-CM

## 2021-07-13 DIAGNOSIS — R8290 Unspecified abnormal findings in urine: Secondary | ICD-10-CM | POA: Diagnosis not present

## 2021-07-13 DIAGNOSIS — Z794 Long term (current) use of insulin: Secondary | ICD-10-CM

## 2021-07-13 DIAGNOSIS — R5383 Other fatigue: Secondary | ICD-10-CM | POA: Insufficient documentation

## 2021-07-13 DIAGNOSIS — Z23 Encounter for immunization: Secondary | ICD-10-CM | POA: Diagnosis not present

## 2021-07-13 DIAGNOSIS — Z87891 Personal history of nicotine dependence: Secondary | ICD-10-CM

## 2021-07-13 DIAGNOSIS — R4182 Altered mental status, unspecified: Secondary | ICD-10-CM | POA: Insufficient documentation

## 2021-07-13 DIAGNOSIS — R5382 Chronic fatigue, unspecified: Secondary | ICD-10-CM

## 2021-07-13 DIAGNOSIS — R350 Frequency of micturition: Secondary | ICD-10-CM | POA: Diagnosis not present

## 2021-07-13 DIAGNOSIS — R944 Abnormal results of kidney function studies: Secondary | ICD-10-CM | POA: Insufficient documentation

## 2021-07-13 LAB — POC URINALSYSI DIPSTICK (AUTOMATED)
Bilirubin, UA: NEGATIVE
Blood, UA: 25
Glucose, UA: POSITIVE — AB
Ketones, UA: NEGATIVE
Leukocytes, UA: NEGATIVE
Nitrite, UA: NEGATIVE
Protein, UA: POSITIVE — AB
Spec Grav, UA: 1.02 (ref 1.010–1.025)
Urobilinogen, UA: 0.2 E.U./dL
pH, UA: 6 (ref 5.0–8.0)

## 2021-07-13 LAB — CBC WITH DIFFERENTIAL/PLATELET
Basophils Absolute: 0.1 10*3/uL (ref 0.0–0.1)
Basophils Relative: 0.8 % (ref 0.0–3.0)
Eosinophils Absolute: 0.1 10*3/uL (ref 0.0–0.7)
Eosinophils Relative: 1.4 % (ref 0.0–5.0)
HCT: 30.4 % — ABNORMAL LOW (ref 36.0–46.0)
Hemoglobin: 10.1 g/dL — ABNORMAL LOW (ref 12.0–15.0)
Lymphocytes Relative: 22.9 % (ref 12.0–46.0)
Lymphs Abs: 1.7 10*3/uL (ref 0.7–4.0)
MCHC: 33.3 g/dL (ref 30.0–36.0)
MCV: 81.3 fl (ref 78.0–100.0)
Monocytes Absolute: 0.5 10*3/uL (ref 0.1–1.0)
Monocytes Relative: 7.2 % (ref 3.0–12.0)
Neutro Abs: 5.1 10*3/uL (ref 1.4–7.7)
Neutrophils Relative %: 67.7 % (ref 43.0–77.0)
Platelets: 304 10*3/uL (ref 150.0–400.0)
RBC: 3.74 Mil/uL — ABNORMAL LOW (ref 3.87–5.11)
RDW: 13.8 % (ref 11.5–15.5)
WBC: 7.5 10*3/uL (ref 4.0–10.5)

## 2021-07-13 LAB — COMPREHENSIVE METABOLIC PANEL
ALT: 13 U/L (ref 0–35)
AST: 16 U/L (ref 0–37)
Albumin: 2.6 g/dL — ABNORMAL LOW (ref 3.5–5.2)
Alkaline Phosphatase: 80 U/L (ref 39–117)
BUN: 23 mg/dL (ref 6–23)
CO2: 28 mEq/L (ref 19–32)
Calcium: 8.1 mg/dL — ABNORMAL LOW (ref 8.4–10.5)
Chloride: 104 mEq/L (ref 96–112)
Creatinine, Ser: 2.56 mg/dL — ABNORMAL HIGH (ref 0.40–1.20)
GFR: 19.04 mL/min — ABNORMAL LOW (ref >60.00)
Glucose, Bld: 156 mg/dL — ABNORMAL HIGH (ref 70–99)
Potassium: 4 mEq/L (ref 3.5–5.1)
Sodium: 138 mEq/L (ref 135–145)
Total Bilirubin: 0.4 mg/dL (ref 0.2–1.2)
Total Protein: 4.7 g/dL — ABNORMAL LOW (ref 6.0–8.3)

## 2021-07-13 LAB — POCT UA - MICROSCOPIC ONLY

## 2021-07-13 LAB — VITAMIN B12: Vitamin B-12: 322 pg/mL (ref 211–911)

## 2021-07-13 LAB — T4, FREE: Free T4: 1.16 ng/dL (ref 0.60–1.60)

## 2021-07-13 LAB — TSH: TSH: 6.82 u[IU]/mL — ABNORMAL HIGH (ref 0.35–5.50)

## 2021-07-13 MED ORDER — CEPHALEXIN 500 MG PO CAPS: 500.0000 mg | ORAL_CAPSULE | Freq: Two times a day (BID) | ORAL | 0 refills | Status: DC

## 2021-07-13 NOTE — Assessment & Plan Note (Addendum)
In medically complex pt with dep/anx and out of control DM and likely uti  Lab and urine cx pending  Close f/u

## 2021-07-13 NOTE — Assessment & Plan Note (Signed)
Some wbc and rbc on ua today  Poss uti with some MS change Keflex sent to pharmacy and enc fluids  Pending culture

## 2021-07-13 NOTE — Assessment & Plan Note (Signed)
With some pm delirium  Plan to r/o organic cause/may be multifactorial  F/u for MMSE

## 2021-07-13 NOTE — Assessment & Plan Note (Signed)
ua with some rbc/wbc-cx pending  Also high glucose after her insulin pump stopped working

## 2021-07-13 NOTE — Assessment & Plan Note (Signed)
Some delirium worse at night with audio hallucinations  No wandering Some falls/cannot r/o head injury with one  Short term memory problems/forgets med Complex med picture with chf and DM High glucose after issues with insulin pump  May have uti now (tx with keflex)  At risk for dehydration and poor nutrition  Labs and ua today  F/u in 2 week or earlier if needed  Will plan MMSE

## 2021-07-13 NOTE — Assessment & Plan Note (Signed)
With gen weakness CT head ordered

## 2021-07-13 NOTE — Assessment & Plan Note (Signed)
Per pt, insulin pump did not work for a while/ sees endocrinologist tomorrow High glucose now coming down Wt loss noted  No doubt adding to mental status change

## 2021-07-13 NOTE — Progress Notes (Signed)
Subjective:    Patient ID: Jennifer [name], female    DOB: 1955/12/17, 66 y.o.   MRN: [id number]  This visit occurred during the SARS-CoV-2 public health emergency.  Safety protocols were in place, including screening questions prior to the visit, additional usage of staff PPE, and extensive cleaning of exam room while observing appropriate contact time as indicated for disinfecting solutions.   HPI Pt presents for concerns re: memory   Wt Readings from Last 3 Encounters:  07/13/21 257 lb 8 oz (116.8 kg)  03/22/21 271 lb (122.9 kg)  06/18/20 283 lb 12.8 oz (128.7 kg)   40.33 kg/m  Some memory issues  Last night - thought her dead parents were there  She heard them  This is new - audio hallucination  Goes back to her childhood/like she has to go back to school  Any time of day   Forgets med and then tells family she took it  Did not tell anyone insulin pump broke (was w/o it for a while)  Appetite is not great Sometimes forgets to eat   Gets agitated at times  Says everyone is yelling at her   Falls all the time (she uses a walker)  Has not hit her head More incontinence - gets up all night long to get up and urinate  Very high sugar 400s-500s sometimes   Sees endo on Monday    Some headaches  In temple areas - this is new  Brief/not too severe Has to rub her head   Freq urination  ? Due to DM No dysuria Results for orders placed or performed in visit on 07/13/21  POCT Urinalysis Dipstick (Automated)  Result Value Ref Range   Color, UA Yellow    Clarity, UA Hazy    Glucose, UA Positive (A) Negative   Bilirubin, UA Negative    Ketones, UA Negative    Spec Grav, UA 1.020 1.010 - 1.025   Blood, UA 25 Ery/uL    pH, UA 6.0 5.0 - 8.0   Protein, UA Positive (A) Negative   Urobilinogen, UA 0.2 0.2 or 1.0 E.U./dL   Nitrite, UA Negative    Leukocytes, UA Negative Negative  POCT UA - Microscopic Only  Result Value Ref Range   WBC, Ur, HPF, POC 3-5 0 - 5   RBC,  Urine, Miroscopic 2-3 0 - 2   Bacteria, U Microscopic few None - Trace   Mucus, UA few    Epithelial cells, urine per micros mod    Crystals, Ur, HPF, POC none    Casts, Ur, LPF, POC one    Yeast, UA none      Quit smoking on her own   Is mobility impaired   Sees endocrinology for DM and hypothyroidism    Mood  Takes wellbutrin and cymbalta No big changes in mood    Lab Results  Component Value Date   VITAMINB12 347 11/15/2017   Patient Active Problem List   Diagnosis Date Noted   Frequent urination 07/13/2021   Change in mental status 07/13/2021   Fatigue 07/13/2021   Headache 07/13/2021   Gait abnormality 07/13/2021   Memory loss 07/13/2021   Abnormal urinalysis 07/13/2021   Diabetic nephropathy ([place]) 05/28/2020   Diabetic retinopathy ([place]) 05/28/2020   Presence of insulin pump (external) (internal) 05/28/2020   Diabetic macular edema of right eye with proliferative retinopathy associated with type 2 diabetes mellitus ([place]) 04/16/2020   Macular pucker, right eye 04/16/2020   Vitreous hemorrhage  of left eye ([place]) 04/16/2020   Pre-op examination 03/13/2020   Chronic venous insufficiency 02/06/2020   UTI (urinary tract infection) 05/28/2019   Constipation 05/28/2019   S/P TKR (total knee replacement), right 03/23/2019 05/07/2019   Primary osteoarthritis of right knee 04/02/2019   Unilateral primary osteoarthritis, right knee    Pedal edema 02/25/2019   Acute vertigo with vomiting and inability to stand 10/08/2018   Type 2 diabetes mellitus ([place]) 10/08/2018   Former smoker 10/08/2018   Carotid artery disease ([place]) 03/15/2018   History of CVA (cerebrovascular accident) 11/16/2017   Morbid obesity ([place]) 11/09/2017   Low back pain 10/26/2017   Screening mammogram, encounter for 09/18/2017   CAD S/P percutaneous coronary angioplasty 11/04/2015   NSTEMI (non-ST elevated myocardial infarction) ([place]) 11/03/2015   Hypothyroidism 07/06/2010   Hyperlipidemia associated  with type 2 diabetes mellitus ([place]) 08/28/2008   Essential hypertension 11/13/2007   Proliferative diabetic retinopathy of left eye with macular edema associated with type 2 diabetes mellitus ([place]) 09/21/2006   Diabetic peripheral neuropathy ([place]) 09/21/2006   Adjustment disorder with mixed anxiety and depressed mood 09/21/2006   CARPAL TUNNEL SYNDROME 09/21/2006   COPD (chronic obstructive pulmonary disease) ([place]) 09/21/2006   EDEMA 09/21/2006   MIGRAINES, HX OF 09/21/2006   Past Medical History:  Diagnosis Date   CAD (coronary artery disease)    cath 5/23 100% dist RCA lesion treated with 2 overlapping Integrity Resolute DES ( 2.25x29mm, 2.25x13mm), 60% mid RCA treated medically, 99% OM2 not amenable to PCI, 70% D1 lesion, EF normal   Hypercholesteremia    Hypertension    Hypothyroidism    Stroke ([place]) 10/2018   Thyroid disease    Type 2 diabetes mellitus ([place]) 10/08/2018   Past Surgical History:  Procedure Laterality Date   ABDOMINAL HYSTERECTOMY     CARDIAC CATHETERIZATION N/A 11/03/2015   Procedure: Left Heart Cath and Coronary Angiography;  Surgeon: Leonie Man, MD;  Location: [facility];  Service: Cardiovascular;  Laterality: N/A;   CARDIAC CATHETERIZATION N/A 11/03/2015   Procedure: Coronary Stent Intervention;  Surgeon: Leonie Man, MD;  Location: [facility];  Service: Cardiovascular;  Laterality: N/A;   CARPAL TUNNEL RELEASE     CORONARY ANGIOPLASTY     1 stent   TOTAL KNEE ARTHROPLASTY Right 04/02/2019   Procedure: TOTAL KNEE ARTHROPLASTY;  Surgeon: Carole Civil, MD;  Location: [facility];  Service: Orthopedics;  Laterality: Right;   Social History   Tobacco Use   Smoking status: Former    Packs/day: 0.25    Types: Cigarettes    Quit date: 05/13/2021    Years since quitting: 0.1   Smokeless tobacco: Never  Substance Use Topics   Alcohol use: No    Alcohol/week: 0.0 standard drinks   Drug use: No   Family History  Problem Relation Age  of Onset   Heart disease Mother    Stroke Sister    Heart attack Brother    Allergies  Allergen Reactions   Metformin Hcl Other (See Comments)   Current Outpatient Medications on File Prior to Visit  Medication Sig Dispense Refill   amLODipine (NORVASC) 10 MG tablet Take 1 tablet (10 mg total) by mouth daily. 90 tablet 2   aspirin EC 81 MG tablet Take 1 tablet (81 mg total) by mouth 2 (two) times daily. Hold aspirin while you are on apixaban/Eliquis 1 tablet 0   atorvastatin (LIPITOR) 80 MG tablet TAKE 1 TABLET BY MOUTH ONCE DAILY 6  IN THE EVENING 90 tablet 2   buPROPion (WELLBUTRIN SR) 150 MG 12 hr tablet Take 1 tablet by mouth twice daily 180 tablet 0   carvedilol (COREG) 12.5 MG tablet Take 1 tablet (12.5 mg total) by mouth 2 (two) times daily with a meal. 180 tablet 2   doxycycline (VIBRA-TABS) 100 MG tablet Take 1 tablet (100 mg total) by mouth 2 (two) times daily. 20 tablet 0   DULoxetine (CYMBALTA) 60 MG capsule Take 1 capsule by mouth once daily 90 capsule 0   ezetimibe (ZETIA) 10 MG tablet Take 1 tablet (10 mg total) by mouth daily. 90 tablet 2   furosemide (LASIX) 40 MG tablet Take 1 tablet (40 mg total) by mouth 2 (two) times daily. 8AM, 1 PM 180 tablet 2   insulin aspart (NOVOLOG) 100 UNIT/ML injection 10 units twice a day     levothyroxine (SYNTHROID) 137 MCG tablet Take 137 mcg by mouth daily before breakfast.     losartan (COZAAR) 25 MG tablet Take 0.5 tablets (12.5 mg total) by mouth daily. 45 tablet 1   nitroGLYCERIN (NITROSTAT) 0.4 MG SL tablet Place 1 tablet (0.4 mg total) under the tongue every 5 (five) minutes x 3 doses as needed for chest pain. 25 tablet 3   Insulin Human (INSULIN PUMP) SOLN Inject into the skin continuous. Patient may bolus up to 40 units at a time, with a maximum daily limit of 120 units (Patient not taking: Reported on 07/13/2021)     No current facility-administered medications on file prior to visit.     Review of Systems  Constitutional:   Positive for activity change and fatigue. Negative for appetite change, fever and unexpected weight change.  HENT:  Negative for congestion, ear pain, rhinorrhea, sinus pressure and sore throat.   Eyes:  Negative for pain, redness and visual disturbance.  Respiratory:  Negative for cough, shortness of breath and wheezing.        No more sob or exercise intol than baseline  Cardiovascular:  Positive for leg swelling. Negative for chest pain and palpitations.  Gastrointestinal:  Negative for abdominal pain, blood in stool, constipation and diarrhea.  Endocrine: Negative for polydipsia and polyuria.  Genitourinary:  Positive for frequency and urgency. Negative for dysuria, hematuria and pelvic pain.  Musculoskeletal:  Positive for arthralgias and back pain. Negative for myalgias.  Skin:  Negative for pallor and rash.  Allergic/Immunologic: Negative for environmental allergies.  Neurological:  Negative for dizziness, syncope, speech difficulty and headaches.       Poor balance Generalized weakness/not focal   Needs walker for ambulation now   Hematological:  Negative for adenopathy. Does not bruise/bleed easily.  Psychiatric/Behavioral:  Positive for agitation, confusion and hallucinations. Negative for behavioral problems, decreased concentration, dysphoric mood and sleep disturbance. The patient is nervous/anxious.       Objective:   Physical Exam Constitutional:      General: She is not in acute distress.    Appearance: She is well-developed. She is obese. She is not ill-appearing or diaphoretic.     Comments: Frail appearing with walker and in chair  Recent wt loss noted   HENT:     Head: Normocephalic and atraumatic.     Comments: No temporal tenderness    Mouth/Throat:     Pharynx: Oropharynx is clear. No posterior oropharyngeal erythema.     Comments: Mucous membranes are tacky (not wet but not entirely dry) Eyes:     General: No scleral icterus.  Right eye: No discharge.         Left eye: No discharge.     Conjunctiva/sclera: Conjunctivae normal.     Pupils: Pupils are equal, round, and reactive to light.  Neck:     Thyroid: No thyromegaly.     Vascular: No carotid bruit or JVD.  Cardiovascular:     Rate and Rhythm: Normal rate and regular rhythm.     Heart sounds: Normal heart sounds.    No gallop.  Pulmonary:     Effort: Pulmonary effort is normal. No respiratory distress.     Breath sounds: Normal breath sounds. No wheezing or rales.     Comments: Diffusely distant bs No crackles  Abdominal:     General: Bowel sounds are normal. There is no distension or abdominal bruit.     Palpations: Abdomen is soft. There is no mass.     Tenderness: There is no abdominal tenderness.  Musculoskeletal:     Cervical back: Normal range of motion and neck supple.     Right lower leg: Edema present.     Left lower leg: Edema present.  Lymphadenopathy:     Cervical: No cervical adenopathy.  Skin:    General: Skin is warm and dry.     Coloration: Skin is not jaundiced or pale.     Findings: No bruising, erythema or rash.  Neurological:     Mental Status: She is alert.     Cranial Nerves: Cranial nerves 2-12 are intact. No cranial nerve deficit.     Motor: Tremor present. No abnormal muscle tone.     Coordination: Coordination normal.     Gait: Gait abnormal.     Deep Tendon Reflexes: Reflexes are normal and symmetric. Reflexes normal.     Comments: No focal weakness (generalized noted)  Dependent on walker for ambulation and needs help to rise from a chair   No facial tenderness   Psychiatric:        Mood and Affect: Mood is depressed.        Speech: Speech normal.     Comments: Pleasant and quiet           Assessment & Plan:   Problem List Items Addressed This Visit       Endocrine   Type 2 diabetes mellitus ([place])    Per pt, insulin pump did not work for a while/ sees endocrinologist tomorrow High glucose now coming down Wt loss noted  No  doubt adding to mental status change         Genitourinary   UTI (urinary tract infection)    Some wbc and rbc on ua today  Poss uti with some MS change Keflex sent to pharmacy and enc fluids  Pending culture       Relevant Medications   cephALEXin (KEFLEX) 500 MG capsule     Other   Abnormal urinalysis    May have uti cx pending      Relevant Orders   Urine Culture   POCT UA - Microscopic Only (Completed)   Adjustment disorder with mixed anxiety and depressed mood    Per pt stable with wellbutrin and cymbalta  ? If a player in recent MS changes       Change in mental status - Primary    Some delirium worse at night with audio hallucinations  No wandering Some falls/cannot r/o head injury with one  Short term memory problems/forgets med Complex med picture with chf and DM High  glucose after issues with insulin pump  May have uti now (tx with keflex)  At risk for dehydration and poor nutrition  Labs and ua today  F/u in 2 week or earlier if needed  Will plan MMSE       Relevant Orders   CBC with Differential/Platelet   Comprehensive metabolic panel   TSH   Vitamin B12   T4, free   Fatigue    In medically complex pt with dep/anx and out of control DM and likely uti  Lab and urine cx pending  Close f/u      Relevant Orders   CBC with Differential/Platelet   Comprehensive metabolic panel   TSH   Vitamin B12   T4, free   Former smoker    Commended on smoking cessation       Frequent urination    ua with some rbc/wbc-cx pending  Also high glucose after her insulin pump stopped working       Relevant Orders   POCT Urinalysis Dipstick (Automated) (Completed)   POCT UA - Microscopic Only (Completed)   Gait abnormality    With gen weakness CT head ordered      Headache    New, bilat in temples At risk for dehydration  Likely has uti  Past h/o cva  No acute changes on neuro exam except ataxia / worse balance  Recent MS change as well CT head  ordered/urgent status       Memory loss    With some pm delirium  Plan to r/o organic cause/may be multifactorial  F/u for MMSE      Relevant Orders   CBC with Differential/Platelet   Comprehensive metabolic panel   TSH   Vitamin B12   T4, free   Other Visit Diagnoses     Need for influenza vaccination       Relevant Orders   Flu Vaccine QUAD High Dose(Fluad) (Completed)

## 2021-07-13 NOTE — Assessment & Plan Note (Signed)
Commended on smoking cessation  

## 2021-07-13 NOTE — Telephone Encounter (Signed)
Pt daughter called back return a call about results

## 2021-07-13 NOTE — Assessment & Plan Note (Signed)
New, bilat in temples At risk for dehydration  Likely has uti  Past h/o cva  No acute changes on neuro exam except ataxia / worse balance  Recent MS change as well CT head ordered/urgent status

## 2021-07-13 NOTE — Patient Instructions (Addendum)
Labs  Urinalysis  I want to get a CT of your head   Try to eat and drink regularly    Follow up in about 2 weeks   If symptoms suddenly worsen or change in the meantime let me know

## 2021-07-13 NOTE — Assessment & Plan Note (Signed)
Per pt stable with wellbutrin and cymbalta  ? If a player in recent MS changes

## 2021-07-13 NOTE — Assessment & Plan Note (Signed)
May have uti cx pending

## 2021-07-13 NOTE — Telephone Encounter (Signed)
Addressed through result notes  

## 2021-07-14 ENCOUNTER — Other Ambulatory Visit: Payer: Self-pay

## 2021-07-14 ENCOUNTER — Other Ambulatory Visit (INDEPENDENT_AMBULATORY_CARE_PROVIDER_SITE_OTHER): Payer: Medicare Other

## 2021-07-14 ENCOUNTER — Ambulatory Visit (HOSPITAL_COMMUNITY)
Admission: RE | Admit: 2021-07-14 | Discharge: 2021-07-14 | Disposition: A | Discharge status: Home / Self Care | Payer: Medicare Other | Source: Ambulatory Visit | Attending: Podiatry | Admitting: Podiatry

## 2021-07-14 ENCOUNTER — Telehealth: Payer: Self-pay | Admitting: *Deleted

## 2021-07-14 ENCOUNTER — Ambulatory Visit
Admission: RE | Admit: 2021-07-14 | Discharge: 2021-07-14 | Disposition: A | Discharge status: Home / Self Care | Payer: Medicare Other | Source: Ambulatory Visit | Attending: Family Medicine | Admitting: Family Medicine

## 2021-07-14 ENCOUNTER — Telehealth: Payer: Self-pay | Admitting: Family Medicine

## 2021-07-14 DIAGNOSIS — R413 Other amnesia: Secondary | ICD-10-CM

## 2021-07-14 DIAGNOSIS — M7989 Other specified soft tissue disorders: Secondary | ICD-10-CM | POA: Diagnosis present

## 2021-07-14 DIAGNOSIS — R410 Disorientation, unspecified: Secondary | ICD-10-CM

## 2021-07-14 DIAGNOSIS — D649 Anemia, unspecified: Secondary | ICD-10-CM

## 2021-07-14 DIAGNOSIS — R269 Unspecified abnormalities of gait and mobility: Secondary | ICD-10-CM

## 2021-07-14 DIAGNOSIS — R944 Abnormal results of kidney function studies: Secondary | ICD-10-CM

## 2021-07-14 DIAGNOSIS — G44209 Tension-type headache, unspecified, not intractable: Secondary | ICD-10-CM

## 2021-07-14 LAB — URINE CULTURE
MICRO NUMBER:: 12943433
SPECIMEN QUALITY:: ADEQUATE

## 2021-07-14 LAB — IRON: Iron: 73 ug/dL (ref 42–145)

## 2021-07-14 LAB — FERRITIN: Ferritin: 41.7 ng/mL (ref 10.0–291.0)

## 2021-07-14 NOTE — Progress Notes (Signed)
Subjective: 66 year old female presents the office today for concerns of swelling to her left foot.  She states this started about a year ago and has been ongoing.  She had swelling to her leg as well.  The swelling is intermittent.  She does have a history of left leg where she dropped a jack on her leg at work years ago.  She states that she is a hard time walking given a prominence in the bottom of her left foot.  She was previously scheduled to have surgery with Dr. Sherryle Lis but never had this done.  She states the nails are thick and elongated she cannot trim it herself.  Causing discomfort.  She is hoping to have the big toenails removed today.  History of metatarsal fractures left foot.  Her last A1c was 10 and her last blood sugar was 255.  Pump is broken and she has not been getting her medications.  Denies any fevers or chills.  No chest pain or shortness of breath.  Objective: AAO x3, NAD DP/PT pulses palpable bilaterally, CRT less than 3 seconds There is chronic appearing edema bilateral lower extremities left side worse than the right.  Mild erythema to the left leg but likely more from the swelling, inflammation.  There is no skin breakdown.  Preulcerative lesion plantar aspect left foot without any ulcerations noted today.  No edema, erythema.  Nails appear to be hypertrophic, dystrophic with yellow-brown discoloration.  No center with the hallux toenails.  Tenderness nails 1-5 bilaterally.  No signs of infection to the toenail sites. No pain with calf compression, swelling, warmth, erythema  Assessment: Left lower extremity edema, symptomatic onychomycosis, preulcerative lesion left foot with uncontrolled diabetes  Plan: -All treatment options discussed with the patient including all alternatives, risks, complications.  -X-rays obtained reviewed.  Old fractures noted on metatarsals but no evidence of acute fracture of the left foot. -Given this we will have ordered a venous  duplex and we have an appointment scheduled for later on today.  Also prescribe doxycycline. -Nails sharply debrided x10 without any complications or bleeding.  Given hyperglycemia would recommend holding off on nail removals today. -Offloading left foot.  We will hold off on surgery for now until sugars have improved. -Patient encouraged to call the office with any questions, concerns, change in symptoms.   Return in about 1 week (around 07/19/2021).  Trula Slade DPM

## 2021-07-14 NOTE — Telephone Encounter (Signed)
Rodman Key w/ [facility] is calling w/ preliminary report for patient, negative for DVT,releasing patient and informed that office will contact for f/u. Results are available in epic to view.

## 2021-07-14 NOTE — Telephone Encounter (Signed)
-----   Message from Tammi Sou, [place] sent at 07/14/2021  3:23 PM EST ----- Pt's daughter Barnett Applebaum notified of lab results and Dr. Marliss Coots comments. She did want me to let PCP know that Gina's daughter (pt's Granddaughter) also has kidney function issues so she said it may run the in family).  Barnett Applebaum does want to proceed with Kidney specialist she said either city is fine [place] or [employer].  She will have pt increase water intake and avoid nsaids and will await our call regarding urine cx.   Copy of labs sent to endo

## 2021-07-14 NOTE — Telephone Encounter (Signed)
Addressed through result notes  

## 2021-07-14 NOTE — Telephone Encounter (Signed)
Left VM requesting pt to call the office back regarding lab results and CT

## 2021-07-14 NOTE — Telephone Encounter (Signed)
I put the referral in  Please call the office if they have not heard anything in 2 weeks (she has f/u with me around that time anyway)  Will update when urine culture returns

## 2021-07-15 NOTE — Telephone Encounter (Signed)
Daughter aware.

## 2021-07-21 NOTE — Telephone Encounter (Signed)
Referral sent to  [facility] [address] [place], [place] [address] [phone]

## 2021-07-22 ENCOUNTER — Other Ambulatory Visit: Payer: Self-pay

## 2021-07-22 ENCOUNTER — Ambulatory Visit (INDEPENDENT_AMBULATORY_CARE_PROVIDER_SITE_OTHER): Payer: Medicare Other | Admitting: Podiatry

## 2021-07-22 DIAGNOSIS — Z794 Long term (current) use of insulin: Secondary | ICD-10-CM

## 2021-07-22 DIAGNOSIS — B351 Tinea unguium: Secondary | ICD-10-CM

## 2021-07-22 DIAGNOSIS — M7989 Other specified soft tissue disorders: Secondary | ICD-10-CM

## 2021-07-22 DIAGNOSIS — E1142 Type 2 diabetes mellitus with diabetic polyneuropathy: Secondary | ICD-10-CM

## 2021-07-22 DIAGNOSIS — M898X7 Other specified disorders of bone, ankle and foot: Secondary | ICD-10-CM

## 2021-07-27 ENCOUNTER — Other Ambulatory Visit: Payer: Self-pay

## 2021-07-27 ENCOUNTER — Ambulatory Visit (INDEPENDENT_AMBULATORY_CARE_PROVIDER_SITE_OTHER): Payer: Medicare Other | Admitting: Family Medicine

## 2021-07-27 ENCOUNTER — Encounter: Payer: Self-pay | Admitting: Podiatry

## 2021-07-27 ENCOUNTER — Encounter: Payer: Self-pay | Admitting: Family Medicine

## 2021-07-27 VITALS — BP 124/76 | HR 73 | Temp 97.7°F | Ht 67.0 in | Wt 243.0 lb

## 2021-07-27 DIAGNOSIS — R410 Disorientation, unspecified: Secondary | ICD-10-CM | POA: Diagnosis not present

## 2021-07-27 DIAGNOSIS — D649 Anemia, unspecified: Secondary | ICD-10-CM

## 2021-07-27 DIAGNOSIS — G44209 Tension-type headache, unspecified, not intractable: Secondary | ICD-10-CM

## 2021-07-27 DIAGNOSIS — I10 Essential (primary) hypertension: Secondary | ICD-10-CM | POA: Diagnosis not present

## 2021-07-27 DIAGNOSIS — E039 Hypothyroidism, unspecified: Secondary | ICD-10-CM | POA: Diagnosis not present

## 2021-07-27 DIAGNOSIS — R944 Abnormal results of kidney function studies: Secondary | ICD-10-CM

## 2021-07-27 DIAGNOSIS — R5382 Chronic fatigue, unspecified: Secondary | ICD-10-CM

## 2021-07-27 DIAGNOSIS — R413 Other amnesia: Secondary | ICD-10-CM

## 2021-07-27 NOTE — Progress Notes (Signed)
°  Subjective:  Patient ID: Jennifer [name], female    DOB: 06-Mar-1956,  MRN: [id number]  Chief Complaint  Patient presents with   Diabetes    A1C>12 - insulin pump stopped working and patient failed to report this to anyone for a long time. Her family is working with her to get back on track.   Foot Swelling     Swelling left leg, foot pain / [place] last week negative for DVT (Dr. Jacqualyn Posey ordered)    66 y.o. female presents with the above complaint. History confirmed with patient.  Last week she saw Dr. Jacqualyn Posey who sent her for ultrasound for leg swelling.  The ultrasound did not show any DVT.  Her blood sugar is still very high she has been out of her diabetic supplies and has not told anyone.  Objective:  Physical Exam: warm, good capillary refill, no trophic changes or ulcerative lesions, normal DP and PT pulses, and significant edema +1 to bilateral lower extremities left is worse than right, she has an abnormal sensory exam with lack of protective sensation..  Assessment:   1. Dermatophytosis of nail   2. Type 2 diabetes mellitus with diabetic polyneuropathy, with long-term current use of insulin ([place])   3. Diabetic peripheral neuropathy ([place])   4. Leg swelling   5. Exostosis of left foot      Plan:  Patient was evaluated and treated and all questions answered.  I reviewed the results of the ultrasound with her and discussed the importance of lowering her A1c.  Her left foot painful area is still quite an issue for her and she would like to get it addressed surgically at some point again, however I do not think this would be reasonable or possible to her A1c is below 8% and she is well controlled with her brittle diabetes.  I will see her back once her A1c is lower or for at risk foot care as needed.  No follow-ups on file.

## 2021-07-27 NOTE — Assessment & Plan Note (Signed)
This is much improved with better hydration and nutrition and blood sugar control  No longer hallucinating

## 2021-07-27 NOTE — Assessment & Plan Note (Addendum)
Last draw GFR of 19.4- this is new Now glucose is better controlled and she has better fluid intake  Also uti treated  Nephrology referral is pending  bmet and cbc ordered for re check  Adv to avoid nsaids or other nephrotoxins

## 2021-07-27 NOTE — Progress Notes (Signed)
Subjective:    Patient ID: Jennifer [name], female    DOB: 07-24-1955, 66 y.o.   MRN: [id number]  This visit occurred during the SARS-CoV-2 public health emergency.  Safety protocols were in place, including screening questions prior to the visit, additional usage of staff PPE, and extensive cleaning of exam room while observing appropriate contact time as indicated for disinfecting solutions.   HPI Pt presents for f/u of fatigue and mental status change and headache  Wt Readings from Last 3 Encounters:  07/27/21 243 lb (110.2 kg)  07/13/21 257 lb 8 oz (116.8 kg)  03/22/21 271 lb (122.9 kg)   38.06 kg/m  At last visit noted fatigue/ hallucinations and worse short term memory along with headache and urinary symptoms   Mental status is a lot better  Getting around better (less swelling with elevation of legs)  No more hallucinations   Short term memory is about the same      Has worked on fluids  No longer on insulin pump  Glucose is improved  (was 135 this am)  Doing ok with insulin shots instead (15 am, 5 lunch, 15 at night)  No lows and no highs   A1c was around 12    Labs and CT ordered  CT report CT HEAD WO CONTRAST (5MM) (Accession [id number]) (Order [id number]) Imaging Date: 07/14/2021 Department: [facility] Released By: Noralyn Pick Authorizing: Avia Merkley, [name], MD   Exam Status  Status  Final [99]   PACS Intelerad Image Link   Show images for CT HEAD WO CONTRAST (5MM)  Study Result  Narrative & Impression  CLINICAL DATA:  Mental status change.  Memory loss.   EXAM: CT HEAD WITHOUT CONTRAST   TECHNIQUE: Contiguous axial images were obtained from the base of the skull through the vertex without intravenous contrast.   RADIATION DOSE REDUCTION: This exam was performed according to the departmental dose-optimization program which includes automated exposure control, adjustment of the mA and/or kV according to patient size and/or use  of iterative reconstruction technique.   COMPARISON:  10/08/2018   FINDINGS: Brain: No evidence of acute infarction, hemorrhage, extra-axial collection, ventriculomegaly, or mass effect. Generalized cerebral atrophy. Periventricular white matter low attenuation likely secondary to microangiopathy.   Vascular: Cerebrovascular atherosclerotic calcifications are noted.   Skull: Negative for fracture or focal lesion.   Sinuses/Orbits: Visualized portions of the orbits are unremarkable. Small right maxillary sinus air-fluid level. Visualized portions of the mastoid air cells are unremarkable.   Other: None.   IMPRESSION: 1.  No acute intracranial pathology. 2. Chronic microvascular disease and cerebral atrophy.      Labs  Lab Results  Component Value Date   CREATININE 2.56 (H) 07/13/2021   BUN 23 07/13/2021   NA 138 07/13/2021   K 4.0 07/13/2021   CL 104 07/13/2021   CO2 28 07/13/2021  Low ca level of 8.1  New renal lab changes -new with GFR of 19.4 Referral was done for nephrology  Fluid intake is good   No nsaids  Does take losartan 12.5 mg daily    Lab Results  Component Value Date   WBC 7.5 07/13/2021   HGB 10.1 (L) 07/13/2021   HCT 30.4 (L) 07/13/2021   MCV 81.3 07/13/2021   PLT 304.0 07/13/2021   Lab Results  Component Value Date   IRON 73 07/14/2021   FERRITIN 41.7 07/14/2021   Lab Results  Component Value Date   VITAMINB12 322 07/13/2021   Lab Results  Component Value Date   TSH 6.82 (H) 07/13/2021   She sees endocrinology for thyroid  Levothyroxine 137 mcg daily  No missed doses  Will check in with Dr Chalmers Cater   Also for DM-last visit insulin pump was not working   Ua noted protein and glucose and hazy color with rbc and wbc on micro  Urine culture noted contamination  She was px keflex initially for suspected uti  Frequent urination did not change  Patient Active Problem List   Diagnosis Date Noted   Frequent urination 07/13/2021    Change in mental status 07/13/2021   Fatigue 07/13/2021   Headache 07/13/2021   Gait abnormality 07/13/2021   Memory loss 07/13/2021   Abnormal urinalysis 07/13/2021   Decreased GFR 07/13/2021   Normocytic anemia 07/13/2021   Diabetic nephropathy ([place]) 05/28/2020   Diabetic retinopathy ([place]) 05/28/2020   Presence of insulin pump (external) (internal) 05/28/2020   Diabetic macular edema of right eye with proliferative retinopathy associated with type 2 diabetes mellitus ([place]) 04/16/2020   Macular pucker, right eye 04/16/2020   Vitreous hemorrhage of left eye ([place]) 04/16/2020   Pre-op examination 03/13/2020   Chronic venous insufficiency 02/06/2020   UTI (urinary tract infection) 05/28/2019   Constipation 05/28/2019   S/P TKR (total knee replacement), right 03/23/2019 05/07/2019   Primary osteoarthritis of right knee 04/02/2019   Unilateral primary osteoarthritis, right knee    Pedal edema 02/25/2019   Acute vertigo with vomiting and inability to stand 10/08/2018   Type 2 diabetes mellitus ([place]) 10/08/2018   Former smoker 10/08/2018   Carotid artery disease ([place]) 03/15/2018   History of CVA (cerebrovascular accident) 11/16/2017   Morbid obesity ([place]) 11/09/2017   Low back pain 10/26/2017   Screening mammogram, encounter for 09/18/2017   CAD S/P percutaneous coronary angioplasty 11/04/2015   NSTEMI (non-ST elevated myocardial infarction) ([place]) 11/03/2015   Hypothyroidism 07/06/2010   Hyperlipidemia associated with type 2 diabetes mellitus ([place]) 08/28/2008   Essential hypertension 11/13/2007   Proliferative diabetic retinopathy of left eye with macular edema associated with type 2 diabetes mellitus ([place]) 09/21/2006   Diabetic peripheral neuropathy ([place]) 09/21/2006   Adjustment disorder with mixed anxiety and depressed mood 09/21/2006   CARPAL TUNNEL SYNDROME 09/21/2006   COPD (chronic obstructive pulmonary disease) ([place]) 09/21/2006   EDEMA 09/21/2006   MIGRAINES, HX OF 09/21/2006    Past Medical History:  Diagnosis Date   CAD (coronary artery disease)    cath 5/23 100% dist RCA lesion treated with 2 overlapping Integrity Resolute DES ( 2.25x16mm, 2.25x54mm), 60% mid RCA treated medically, 99% OM2 not amenable to PCI, 70% D1 lesion, EF normal   Hypercholesteremia    Hypertension    Hypothyroidism    Stroke ([place]) 10/2018   Thyroid disease    Type 2 diabetes mellitus ([place]) 10/08/2018   Past Surgical History:  Procedure Laterality Date   ABDOMINAL HYSTERECTOMY     CARDIAC CATHETERIZATION N/A 11/03/2015   Procedure: Left Heart Cath and Coronary Angiography;  Surgeon: Leonie Man, MD;  Location: [facility];  Service: Cardiovascular;  Laterality: N/A;   CARDIAC CATHETERIZATION N/A 11/03/2015   Procedure: Coronary Stent Intervention;  Surgeon: Leonie Man, MD;  Location: [facility];  Service: Cardiovascular;  Laterality: N/A;   CARPAL TUNNEL RELEASE     CORONARY ANGIOPLASTY     1 stent   TOTAL KNEE ARTHROPLASTY Right 04/02/2019   Procedure: TOTAL KNEE ARTHROPLASTY;  Surgeon: Carole Civil, MD;  Location: [facility];  Service: Orthopedics;  Laterality: Right;   Social History   Tobacco Use   Smoking status: Former    Packs/day: 0.25    Types: Cigarettes    Quit date: 05/13/2021    Years since quitting: 0.2   Smokeless tobacco: Never  Substance Use Topics   Alcohol use: No    Alcohol/week: 0.0 standard drinks   Drug use: No   Family History  Problem Relation Age of Onset   Heart disease Mother    Stroke Sister    Heart attack Brother    Allergies  Allergen Reactions   Metformin Hcl Other (See Comments)   Current Outpatient Medications on File Prior to Visit  Medication Sig Dispense Refill   amLODipine (NORVASC) 10 MG tablet Take 1 tablet (10 mg total) by mouth daily. 90 tablet 2   aspirin EC 81 MG tablet Take 1 tablet (81 mg total) by mouth 2 (two) times daily. Hold aspirin while you are on apixaban/Eliquis 1 tablet 0    atorvastatin (LIPITOR) 80 MG tablet TAKE 1 TABLET BY MOUTH ONCE DAILY 6 IN THE EVENING 90 tablet 2   BD PEN NEEDLE NANO 2ND GEN 32G X 4 MM MISC Inject into the skin 3 (three) times daily.     buPROPion (WELLBUTRIN SR) 150 MG 12 hr tablet Take 1 tablet by mouth twice daily 180 tablet 0   carvedilol (COREG) 12.5 MG tablet Take 1 tablet (12.5 mg total) by mouth 2 (two) times daily with a meal. 180 tablet 2   DULoxetine (CYMBALTA) 60 MG capsule Take 1 capsule by mouth once daily 90 capsule 0   ezetimibe (ZETIA) 10 MG tablet Take 1 tablet (10 mg total) by mouth daily. 90 tablet 2   furosemide (LASIX) 40 MG tablet Take 1 tablet (40 mg total) by mouth 2 (two) times daily. 8AM, 1 PM 180 tablet 2   HUMULIN 70/30 KWIKPEN (70-30) 100 UNIT/ML KwikPen Inject into the skin.     levothyroxine (SYNTHROID) 137 MCG tablet Take 137 mcg by mouth daily before breakfast.     losartan (COZAAR) 25 MG tablet Take 0.5 tablets (12.5 mg total) by mouth daily. 45 tablet 1   nitroGLYCERIN (NITROSTAT) 0.4 MG SL tablet Place 1 tablet (0.4 mg total) under the tongue every 5 (five) minutes x 3 doses as needed for chest pain. 25 tablet 3   cephALEXin (KEFLEX) 500 MG capsule Take 1 capsule (500 mg total) by mouth 2 (two) times daily. (Patient not taking: Reported on 07/27/2021) 14 capsule 0   doxycycline (VIBRA-TABS) 100 MG tablet Take 1 tablet (100 mg total) by mouth 2 (two) times daily. (Patient not taking: Reported on 07/27/2021) 20 tablet 0   insulin aspart (NOVOLOG) 100 UNIT/ML injection 10 units twice a day (Patient not taking: Reported on 07/27/2021)     Insulin Human (INSULIN PUMP) SOLN Inject into the skin continuous. Patient may bolus up to 40 units at a time, with a maximum daily limit of 120 units (Patient not taking: Reported on 07/27/2021)     No current facility-administered medications on file prior to visit.    Review of Systems  Constitutional:  Positive for fatigue. Negative for activity change, appetite change, fever  and unexpected weight change.  HENT:  Negative for congestion, ear pain, rhinorrhea, sinus pressure and sore throat.   Eyes:  Negative for pain, redness and visual disturbance.  Respiratory:  Negative for cough, shortness of breath and wheezing.   Cardiovascular:  Negative for chest pain and palpitations.  Gastrointestinal:  Negative for abdominal pain, blood in stool, constipation and diarrhea.  Endocrine: Negative for polydipsia and polyuria.  Genitourinary:  Negative for dysuria, frequency and urgency.  Musculoskeletal:  Negative for arthralgias, back pain and myalgias.  Skin:  Negative for pallor and rash.  Allergic/Immunologic: Negative for environmental allergies.  Neurological:  Negative for dizziness, syncope and headaches.  Hematological:  Negative for adenopathy. Does not bruise/bleed easily.  Psychiatric/Behavioral:  Positive for decreased concentration. Negative for confusion, dysphoric mood, self-injury and suicidal ideas. The patient is nervous/anxious.       Objective:   Physical Exam Constitutional:      General: She is not in acute distress.    Appearance: Normal appearance. She is obese. She is not ill-appearing or diaphoretic.  HENT:     Head: Normocephalic and atraumatic.     Mouth/Throat:     Mouth: Mucous membranes are moist.  Eyes:     General: No scleral icterus.    Conjunctiva/sclera: Conjunctivae normal.     Pupils: Pupils are equal, round, and reactive to light.  Neck:     Vascular: No carotid bruit.  Cardiovascular:     Rate and Rhythm: Normal rate and regular rhythm.     Heart sounds: Normal heart sounds.  Pulmonary:     Effort: Pulmonary effort is normal. No respiratory distress.     Breath sounds: Normal breath sounds. No wheezing or rales.     Comments: No crackles  Musculoskeletal:     Cervical back: Normal range of motion and neck supple. No rigidity or tenderness.     Comments: Improved pedal edema   Skin:    Coloration: Skin is not pale.      Findings: No erythema or rash.  Neurological:     Mental Status: She is alert.     Coordination: Coordination normal.     Deep Tendon Reflexes: Reflexes normal.  Psychiatric:        Attention and Perception: Attention normal.        Mood and Affect: Mood is not anxious.        Speech: Speech normal.     Comments: Seems fatigued More talkative and attentive  MMSE score 27/30 Nl clock draw            Assessment & Plan:   Problem List Items Addressed This Visit       Cardiovascular and Mediastinum   Essential hypertension (Chronic)    bp in fair control at this time  BP Readings from Last 1 Encounters:  07/27/21 124/76  No changes needed Most recent labs reviewed  Disc lifstyle change with low sodium diet and exercise        Relevant Orders   Basic metabolic panel   Hepatic function panel     Endocrine   Hypothyroidism    Lab Results  Component Value Date   TSH 6.82 (H) 07/13/2021  Plans to f/u with her endocrinologist  Takes levothyroxine 137 mcg daily        Other   Change in mental status    This is much improved with better hydration and nutrition and blood sugar control  No longer hallucinating        Decreased GFR    Last draw GFR of 19.4- this is new Now glucose is better controlled and she has better fluid intake  Also uti treated  Nephrology referral is pending  bmet and cbc ordered for re check  Adv to avoid nsaids or other nephrotoxins  Relevant Orders   Basic metabolic panel   CBC with Differential/Platelet   Fatigue    Some improvement with blood glucose control, better fluids and calorie intake Labs/CT rev Exam improved Pending lab today      Headache    Resolved Suspect this was related to dehydration       Memory loss - Primary    Reviewed notes CT and lab  Improved today (better cognition) with hydration /nutrition and blood glucose control Denies feeling depressed  MMSE score of 27-reassuring (nl to  [facility]CI) Disc what to watch for  Family will be on alert for worsening status  Enc socialization/reading and brain activity as well as better self care and physical exercise      Normocytic anemia    Suspect due to recent renal change Better fluids /nutrition now  Nl iron  Pending cbc today      Relevant Orders   Pathologist smear review   CBC with Differential/Platelet

## 2021-07-27 NOTE — Assessment & Plan Note (Signed)
Some improvement with blood glucose control, better fluids and calorie intake Labs/CT rev Exam improved Pending lab today

## 2021-07-27 NOTE — Assessment & Plan Note (Signed)
Lab Results  Component Value Date   TSH 6.82 (H) 07/13/2021   Plans to f/u with her endocrinologist  Takes levothyroxine 137 mcg daily

## 2021-07-27 NOTE — Assessment & Plan Note (Signed)
Resolved Suspect this was related to dehydration

## 2021-07-27 NOTE — Patient Instructions (Signed)
Reassuring memory screen  Keep drinking water  Elevate feet   To keep brain healthy- read more/ socialize and stay engaged  Doing puzzles helps also   Lab today   I will get you a phone number for the kidney specialist  I will get labs to Dr Chalmers Cater

## 2021-07-27 NOTE — Assessment & Plan Note (Signed)
bp in fair control at this time  BP Readings from Last 1 Encounters:  07/27/21 124/76   No changes needed Most recent labs reviewed  Disc lifstyle change with low sodium diet and exercise

## 2021-07-27 NOTE — Assessment & Plan Note (Signed)
Reviewed notes CT and lab  Improved today (better cognition) with hydration /nutrition and blood glucose control Denies feeling depressed  MMSE score of 27-reassuring (nl to [facility]CI) Disc what to watch for  Family will be on alert for worsening status  Enc socialization/reading and brain activity as well as better self care and physical exercise

## 2021-07-27 NOTE — Assessment & Plan Note (Signed)
Suspect due to recent renal change Better fluids /nutrition now  Nl iron  Pending cbc today

## 2021-07-28 LAB — CBC WITH DIFFERENTIAL/PLATELET
Absolute Monocytes: 469 cells/uL (ref 200–950)
Basophils Absolute: 63 cells/uL (ref 0–200)
Basophils Relative: 0.9 %
Eosinophils Absolute: 119 cells/uL (ref 15–500)
Eosinophils Relative: 1.7 %
HCT: 33.2 % — ABNORMAL LOW (ref 35.0–45.0)
Hemoglobin: 10.6 g/dL — ABNORMAL LOW (ref 11.7–15.5)
Lymphs Abs: 1491 cells/uL (ref 850–3900)
MCH: 26.8 pg — ABNORMAL LOW (ref 27.0–33.0)
MCHC: 31.9 g/dL — ABNORMAL LOW (ref 32.0–36.0)
MCV: 83.8 fL (ref 80.0–100.0)
MPV: 9.9 fL (ref 7.5–12.5)
Monocytes Relative: 6.7 %
Neutro Abs: 4858 cells/uL (ref 1500–7800)
Neutrophils Relative %: 69.4 %
Platelets: 298 10*3/uL (ref 140–400)
RBC: 3.96 10*6/uL (ref 3.80–5.10)
RDW: 13.1 % (ref 11.0–15.0)
Total Lymphocyte: 21.3 %
WBC: 7 10*3/uL (ref 3.8–10.8)

## 2021-07-28 LAB — HEPATIC FUNCTION PANEL
ALT: 17 U/L (ref 0–35)
AST: 23 U/L (ref 0–37)
Albumin: 2.6 g/dL — ABNORMAL LOW (ref 3.5–5.2)
Alkaline Phosphatase: 67 U/L (ref 39–117)
Bilirubin, Direct: 0 mg/dL (ref 0.0–0.3)
Total Bilirubin: 0.4 mg/dL (ref 0.2–1.2)
Total Protein: 5.4 g/dL — ABNORMAL LOW (ref 6.0–8.3)

## 2021-07-28 LAB — BASIC METABOLIC PANEL
BUN: 32 mg/dL — ABNORMAL HIGH (ref 6–23)
CO2: 28 mEq/L (ref 19–32)
Calcium: 8.3 mg/dL — ABNORMAL LOW (ref 8.4–10.5)
Chloride: 103 mEq/L (ref 96–112)
Creatinine, Ser: 2.38 mg/dL — ABNORMAL HIGH (ref 0.40–1.20)
GFR: 20.78 mL/min — ABNORMAL LOW (ref >60.00)
Glucose, Bld: 194 mg/dL — ABNORMAL HIGH (ref 70–99)
Potassium: 3.5 mEq/L (ref 3.5–5.1)
Sodium: 139 mEq/L (ref 135–145)

## 2021-07-28 LAB — PATHOLOGIST SMEAR REVIEW

## 2021-08-10 ENCOUNTER — Other Ambulatory Visit: Payer: Self-pay | Admitting: Nephrology

## 2021-08-10 DIAGNOSIS — R600 Localized edema: Secondary | ICD-10-CM

## 2021-08-10 DIAGNOSIS — N184 Chronic kidney disease, stage 4 (severe): Secondary | ICD-10-CM

## 2021-08-10 DIAGNOSIS — R809 Proteinuria, unspecified: Secondary | ICD-10-CM

## 2021-08-25 ENCOUNTER — Ambulatory Visit
Admission: RE | Admit: 2021-08-25 | Discharge: 2021-08-25 | Disposition: A | Discharge status: Home / Self Care | Payer: Medicare Other | Source: Ambulatory Visit | Attending: Nephrology | Admitting: Nephrology

## 2021-08-25 DIAGNOSIS — R809 Proteinuria, unspecified: Secondary | ICD-10-CM

## 2021-08-25 DIAGNOSIS — N184 Chronic kidney disease, stage 4 (severe): Secondary | ICD-10-CM

## 2021-08-25 DIAGNOSIS — R600 Localized edema: Secondary | ICD-10-CM

## 2021-09-01 ENCOUNTER — Other Ambulatory Visit: Payer: Self-pay | Admitting: Family Medicine

## 2021-09-14 ENCOUNTER — Other Ambulatory Visit: Payer: Self-pay | Admitting: Family Medicine

## 2021-09-15 NOTE — Telephone Encounter (Signed)
Both filled on 07/12/21 with a 3 month supply, to soon will note be due to the end of April ?

## 2021-09-27 MED ORDER — DULOXETINE HCL 60 MG PO CPEP: 60.0000 mg | ORAL_CAPSULE | Freq: Every day | ORAL | 3 refills | Status: DC

## 2021-09-27 MED ORDER — BUPROPION HCL ER (SR) 150 MG PO TB12: 150.0000 mg | ORAL_TABLET | Freq: Two times a day (BID) | ORAL | 3 refills | Status: DC

## 2021-09-27 NOTE — Telephone Encounter (Signed)
Jennifer [name], patient's daughter called, stating that she has been trying to get refill for the patient on Wellbutrin and Cymbalta for 3 weeks now and it is been denied from [place]. Jennifer [name] said pharmacy told her there are no refills on file for the patient on file with them. Patient has been out of the medications for 3 weeks now. Is it ok to refill now or need to restart differently now since she has been out of this from her system? ?

## 2021-09-27 NOTE — Telephone Encounter (Signed)
Please refill both of those for a year  ? ?I don't see where the pharmacy contacted [place]?  ?I would not have denied them  ? ?Not sure why she could be out if they were both filled on 1/30 (perhaps she never got them)  ? ?In any case pleaser refill  ? ?

## 2021-09-27 NOTE — Addendum Note (Signed)
Addended by: Tammi Sou on: 09/27/2021 12:14 PM ? ? Modules accepted: Orders ? ?

## 2021-10-25 ENCOUNTER — Ambulatory Visit (INDEPENDENT_AMBULATORY_CARE_PROVIDER_SITE_OTHER): Payer: Medicare Other

## 2021-10-25 VITALS — Ht 67.0 in | Wt 233.0 lb

## 2021-10-25 DIAGNOSIS — Z0000 Encounter for general adult medical examination without abnormal findings: Secondary | ICD-10-CM | POA: Diagnosis not present

## 2021-10-25 DIAGNOSIS — Z1231 Encounter for screening mammogram for malignant neoplasm of breast: Secondary | ICD-10-CM | POA: Diagnosis not present

## 2021-10-25 NOTE — Addendum Note (Signed)
Addended by: Kellie Simmering on: 10/25/2021 02:35 PM ? ? Modules accepted: Orders ? ?

## 2021-10-25 NOTE — Progress Notes (Signed)
?I connected with Haig Prophet today by telephone and verified that I am speaking with the correct person using two identifiers. ?Location patient: home ?Location provider: work ?Persons participating in the virtual visit: Haig Prophet, Barnett Applebaum (daughter), Glenna Durand LPN. ?  ?I discussed the limitations, risks, security and privacy concerns of performing an evaluation and management service by telephone and the availability of in person appointments. I also discussed with the patient that there may be a patient responsible charge related to this service. The patient expressed understanding and verbally consented to this telephonic visit.  ?  ?Interactive audio and video telecommunications were attempted between this provider and patient, however failed, due to patient having technical difficulties OR patient did not have access to video capability.  We continued and completed visit with audio only. ? ?  ? ?Vital signs may be patient reported or missing. ? ?Subjective:  ? Jennifer [name] is a 66 y.o. female who presents for an Initial Medicare Annual Wellness Visit. ? ?Review of Systems    ? ?Cardiac Risk Factors include: advanced age (>44men, >45 women);diabetes mellitus;dyslipidemia;hypertension;obesity (BMI >30kg/m2) ? ?   ?Objective:  ?  ?Today's Vitals  ? 10/25/21 1406  ?Weight: 233 lb (105.7 kg)  ?Height: 5\' 7"  (1.702 m)  ? ?Body mass index is 36.49 kg/m?. ? ? ?  10/25/2021  ?  2:16 PM 02/05/2020  ?  7:15 PM 05/26/2019  ? 10:40 PM 05/16/2019  ?  2:02 PM 04/02/2019  ?  6:34 AM 03/29/2019  ? 10:46 AM 10/08/2018  ?  5:00 AM  ?Advanced Directives  ?Does Patient Have a Medical Advance Directive? No No No No No No No  ?Would patient like information on creating a medical advance directive?   No - Patient declined Yes (Inpatient - patient defers creating a medical advance directive at this time - Information given) No - Patient declined No - Patient declined No - Patient declined  ? ? ?Current Medications  (verified) ?Outpatient Encounter Medications as of 10/25/2021  ?Medication Sig  ? aspirin EC 81 MG tablet Take 1 tablet (81 mg total) by mouth 2 (two) times daily. Hold aspirin while you are on apixaban/Eliquis  ? atorvastatin (LIPITOR) 80 MG tablet TAKE 1 TABLET BY MOUTH ONCE DAILY 6 IN THE EVENING  ? BD PEN NEEDLE NANO 2ND GEN 32G X 4 MM MISC Inject into the skin 3 (three) times daily.  ? buPROPion (WELLBUTRIN SR) 150 MG 12 hr tablet Take 1 tablet (150 mg total) by mouth 2 (two) times daily.  ? carvedilol (COREG) 12.5 MG tablet Take 1 tablet (12.5 mg total) by mouth 2 (two) times daily with a meal.  ? DULoxetine (CYMBALTA) 60 MG capsule Take 1 capsule (60 mg total) by mouth daily.  ? ezetimibe (ZETIA) 10 MG tablet Take 1 tablet (10 mg total) by mouth daily.  ? ferrous sulfate 325 (65 FE) MG tablet Take 325 mg by mouth daily with breakfast.  ? furosemide (LASIX) 40 MG tablet Take 1 tablet (40 mg total) by mouth 2 (two) times daily. 8AM, 1 PM (Patient taking differently: Take 80 mg by mouth 2 (two) times daily. 8AM, 1 PM)  ? HUMULIN 70/30 KWIKPEN (70-30) 100 UNIT/ML KwikPen Inject into the skin.  ? levothyroxine (SYNTHROID) 137 MCG tablet Take 137 mcg by mouth daily before breakfast.  ? metolazone (ZAROXOLYN) 5 MG tablet Take 5 mg by mouth daily. Takes on M and F  ? nitroGLYCERIN (NITROSTAT) 0.4 MG SL tablet Place 1 tablet (0.4  mg total) under the tongue every 5 (five) minutes x 3 doses as needed for chest pain.  ? POTASSIUM CHLORIDE PO Take 1 tablet by mouth daily.  ? amLODipine (NORVASC) 10 MG tablet Take 1 tablet (10 mg total) by mouth daily.  ? cephALEXin (KEFLEX) 500 MG capsule Take 1 capsule (500 mg total) by mouth 2 (two) times daily. (Patient not taking: Reported on 07/27/2021)  ? doxycycline (VIBRA-TABS) 100 MG tablet Take 1 tablet (100 mg total) by mouth 2 (two) times daily. (Patient not taking: Reported on 07/27/2021)  ? insulin aspart (NOVOLOG) 100 UNIT/ML injection 10 units twice a day (Patient not taking:  Reported on 07/27/2021)  ? Insulin Human (INSULIN PUMP) SOLN Inject into the skin continuous. Patient may bolus up to 40 units at a time, with a maximum daily limit of 120 units (Patient not taking: Reported on 07/27/2021)  ? losartan (COZAAR) 25 MG tablet Take 0.5 tablets (12.5 mg total) by mouth daily.  ? ?No facility-administered encounter medications on file as of 10/25/2021.  ? ? ?Allergies (verified) ?Metformin hcl  ? ?History: ?Past Medical History:  ?Diagnosis Date  ? CAD (coronary artery disease)   ? cath 5/23 100% dist RCA lesion treated with 2 overlapping Integrity Resolute DES ( 2.25x56mm, 2.25x37mm), 60% mid RCA treated medically, 99% OM2 not amenable to PCI, 70% D1 lesion, EF normal  ? Hypercholesteremia   ? Hypertension   ? Hypothyroidism   ? Stroke [facility]) 10/2018  ? Thyroid disease   ? Type 2 diabetes mellitus ([place]) 10/08/2018  ? ?Past Surgical History:  ?Procedure Laterality Date  ? ABDOMINAL HYSTERECTOMY    ? CARDIAC CATHETERIZATION N/A 11/03/2015  ? Procedure: Left Heart Cath and Coronary Angiography;  Surgeon: Leonie Man, MD;  Location: [facility];  Service: Cardiovascular;  Laterality: N/A;  ? CARDIAC CATHETERIZATION N/A 11/03/2015  ? Procedure: Coronary Stent Intervention;  Surgeon: Leonie Man, MD;  Location: [facility];  Service: Cardiovascular;  Laterality: N/A;  ? CARPAL TUNNEL RELEASE    ? CORONARY ANGIOPLASTY    ? 1 stent  ? TOTAL KNEE ARTHROPLASTY Right 04/02/2019  ? Procedure: TOTAL KNEE ARTHROPLASTY;  Surgeon: Carole Civil, MD;  Location: [facility];  Service: Orthopedics;  Laterality: Right;  ? ?Family History  ?Problem Relation Age of Onset  ? Heart disease Mother   ? Stroke Sister   ? Heart attack Brother   ? ?Social History  ? ?Socioeconomic History  ? Marital status: Married  ?  Spouse name: Not on file  ? Number of children: Not on file  ? Years of education: Not on file  ? Highest education level: Not on file  ?Occupational History  ? Not on file  ?Tobacco  Use  ? Smoking status: Former  ?  Packs/day: 0.25  ?  Types: Cigarettes  ?  Quit date: 05/13/2021  ?  Years since quitting: 0.4  ? Smokeless tobacco: Never  ?Vaping Use  ? Vaping Use: Never used  ?Substance and Sexual Activity  ? Alcohol use: No  ?  Alcohol/week: 0.0 standard drinks  ? Drug use: No  ? Sexual activity: Not on file  ?Other Topics Concern  ? Not on file  ?Social History Narrative  ? Not on file  ? ?Social Determinants of Health  ? ?Financial Resource Strain: Low Risk   ? Difficulty of Paying Living Expenses: Not hard at all  ?Food Insecurity: No Food Insecurity  ? Worried About Charity fundraiser  in the Last Year: Never true  ? Ran Out of Food in the Last Year: Never true  ?Transportation Needs: No Transportation Needs  ? Lack of Transportation (Medical): No  ? Lack of Transportation (Non-Medical): No  ?Physical Activity: Inactive  ? Days of Exercise per Week: 0 days  ? Minutes of Exercise per Session: 0 min  ?Stress: No Stress Concern Present  ? Feeling of Stress : Not at all  ?Social Connections: Not on file  ? ? ?Tobacco Counseling ?Counseling given: Not Answered ? ? ?Clinical Intake: ? ?Pre-visit preparation completed: Yes ? ?Pain : No/denies pain ? ?  ? ?Nutritional Status: BMI > 30  Obese ?Nutritional Risks: None ?Diabetes: Yes ? ?How often do you need to have someone help you when you read instructions, pamphlets, or other written materials from your doctor or pharmacy?: 1 - Never ? ?Diabetic? Yes ?Nutrition Risk Assessment: ? ?Has the patient had any N/V/D within the last 2 months?  No  ?Does the patient have any non-healing wounds?  No  ?Has the patient had any unintentional weight loss or weight gain?  No  ? ?Diabetes: ? ?Is the patient diabetic?  Yes  ?If diabetic, was a CBG obtained today?  No  ?Did the patient bring in their glucometer from home?  No  ?How often do you monitor your CBG's? frequently.  ? ?Financial Strains and Diabetes Management: ? ?Are you having any financial strains  with the device, your supplies or your medication? No .  ?Does the patient want to be seen by Chronic Care Management for management of their diabetes?  No  ?Would the patient like to be referred to a Nutritionist or for D

## 2021-10-25 NOTE — Patient Instructions (Signed)
Jennifer [name] , ?Thank you for taking time to come for your Medicare Wellness Visit. I appreciate your ongoing commitment to your health goals. Please review the following plan we discussed and let me know if I can assist you in the future.  ? ?Screening recommendations/referrals: ?Colonoscopy: completed per patient ?Mammogram: ordered today ?Bone Density: completed 01/15/2003 ?Recommended yearly ophthalmology/optometry visit for glaucoma screening and checkup ?Recommended yearly dental visit for hygiene and checkup ? ?Vaccinations: ?Influenza vaccine: due 01/11/2022 ?Pneumococcal vaccine: due ?Tdap vaccine: completed 11/04/2011, due 11/03/2021 ?Shingles vaccine: discussed   ?Covid-19: decline ? ?Advanced directives: Advance directive discussed with you today.  ? ?Conditions/risks identified: none ? ?Next appointment: Follow up in one year for your annual wellness visit  ? ? ?Preventive Care 67 Years and Older, Female ?Preventive care refers to lifestyle choices and visits with your health care provider that can promote health and wellness. ?What does preventive care include? ?A yearly physical exam. This is also called an annual well check. ?Dental exams once or twice a year. ?Routine eye exams. Ask your health care provider how often you should have your eyes checked. ?Personal lifestyle choices, including: ?Daily care of your teeth and gums. ?Regular physical activity. ?Eating a healthy diet. ?Avoiding tobacco and drug use. ?Limiting alcohol use. ?Practicing safe sex. ?Taking low-dose aspirin every day. ?Taking vitamin and mineral supplements as recommended by your health care provider. ?What happens during an annual well check? ?The services and screenings done by your health care provider during your annual well check will depend on your age, overall health, lifestyle risk factors, and family history of disease. ?Counseling  ?Your health care provider may ask you questions about your: ?Alcohol use. ?Tobacco use. ?Drug  use. ?Emotional well-being. ?Home and relationship well-being. ?Sexual activity. ?Eating habits. ?History of falls. ?Memory and ability to understand (cognition). ?Work and work Statistician. ?Reproductive health. ?Screening  ?You may have the following tests or measurements: ?Height, weight, and BMI. ?Blood pressure. ?Lipid and cholesterol levels. These may be checked every 5 years, or more frequently if you are over 61 years old. ?Skin check. ?Lung cancer screening. You may have this screening every year starting at age 58 if you have a 30-pack-year history of smoking and currently smoke or have quit within the past 15 years. ?Fecal occult blood test (FOBT) of the stool. You may have this test every year starting at age 26. ?Flexible sigmoidoscopy or colonoscopy. You may have a sigmoidoscopy every 5 years or a colonoscopy every 10 years starting at age 49. ?Hepatitis C blood test. ?Hepatitis B blood test. ?Sexually transmitted disease (STD) testing. ?Diabetes screening. This is done by checking your blood sugar (glucose) after you have not eaten for a while (fasting). You may have this done every 1-3 years. ?Bone density scan. This is done to screen for osteoporosis. You may have this done starting at age 45. ?Mammogram. This may be done every 1-2 years. Talk to your health care provider about how often you should have regular mammograms. ?Talk with your health care provider about your test results, treatment options, and if necessary, the need for more tests. ?Vaccines  ?Your health care provider may recommend certain vaccines, such as: ?Influenza vaccine. This is recommended every year. ?Tetanus, diphtheria, and acellular pertussis (Tdap, Td) vaccine. You may need a Td booster every 10 years. ?Zoster vaccine. You may need this after age 62. ?Pneumococcal 13-valent conjugate (PCV13) vaccine. One dose is recommended after age 60. ?Pneumococcal polysaccharide (PPSV23) vaccine. One dose is recommended  after age  97. ?Talk to your health care provider about which screenings and vaccines you need and how often you need them. ?This information is not intended to replace advice given to you by your health care provider. Make sure you discuss any questions you have with your health care provider. ?Document Released: 06/26/2015 Document Revised: 02/17/2016 Document Reviewed: 03/31/2015 ?Elsevier Interactive Patient Education ? 2017 [place]. ? ?Fall Prevention in the Home ?Falls can cause injuries. They can happen to people of all ages. There are many things you can do to make your home safe and to help prevent falls. ?What can I do on the outside of my home? ?Regularly fix the edges of walkways and driveways and fix any cracks. ?Remove anything that might make you trip as you walk through a door, such as a raised step or threshold. ?Trim any bushes or trees on the path to your home. ?Use bright outdoor lighting. ?Clear any walking paths of anything that might make someone trip, such as rocks or tools. ?Regularly check to see if handrails are loose or broken. Make sure that both sides of any steps have handrails. ?Any raised decks and porches should have guardrails on the edges. ?Have any leaves, snow, or ice cleared regularly. ?Use sand or salt on walking paths during winter. ?Clean up any spills in your garage right away. This includes oil or grease spills. ?What can I do in the bathroom? ?Use night lights. ?Install grab bars by the toilet and in the tub and shower. Do not use towel bars as grab bars. ?Use non-skid mats or decals in the tub or shower. ?If you need to sit down in the shower, use a plastic, non-slip stool. ?Keep the floor dry. Clean up any water that spills on the floor as soon as it happens. ?Remove soap buildup in the tub or shower regularly. ?Attach bath mats securely with double-sided non-slip rug tape. ?Do not have throw rugs and other things on the floor that can make you trip. ?What can I do in the  bedroom? ?Use night lights. ?Make sure that you have a light by your bed that is easy to reach. ?Do not use any sheets or blankets that are too big for your bed. They should not hang down onto the floor. ?Have a firm chair that has side arms. You can use this for support while you get dressed. ?Do not have throw rugs and other things on the floor that can make you trip. ?What can I do in the kitchen? ?Clean up any spills right away. ?Avoid walking on wet floors. ?Keep items that you use a lot in easy-to-reach places. ?If you need to reach something above you, use a strong step stool that has a grab bar. ?Keep electrical cords out of the way. ?Do not use floor polish or wax that makes floors slippery. If you must use wax, use non-skid floor wax. ?Do not have throw rugs and other things on the floor that can make you trip. ?What can I do with my stairs? ?Do not leave any items on the stairs. ?Make sure that there are handrails on both sides of the stairs and use them. Fix handrails that are broken or loose. Make sure that handrails are as long as the stairways. ?Check any carpeting to make sure that it is firmly attached to the stairs. Fix any carpet that is loose or worn. ?Avoid having throw rugs at the top or bottom of the stairs. If you  do have throw rugs, attach them to the floor with carpet tape. ?Make sure that you have a light switch at the top of the stairs and the bottom of the stairs. If you do not have them, ask someone to add them for you. ?What else can I do to help prevent falls? ?Wear shoes that: ?Do not have high heels. ?Have rubber bottoms. ?Are comfortable and fit you well. ?Are closed at the toe. Do not wear sandals. ?If you use a stepladder: ?Make sure that it is fully opened. Do not climb a closed stepladder. ?Make sure that both sides of the stepladder are locked into place. ?Ask someone to hold it for you, if possible. ?Clearly mark and make sure that you can see: ?Any grab bars or  handrails. ?First and last steps. ?Where the edge of each step is. ?Use tools that help you move around (mobility aids) if they are needed. These include: ?Canes. ?Walkers. ?Scooters. ?Crutches. ?Turn on the lights when yo

## 2021-11-19 ENCOUNTER — Encounter (HOSPITAL_COMMUNITY): Payer: Self-pay | Admitting: Emergency Medicine

## 2021-11-19 ENCOUNTER — Encounter: Payer: Self-pay | Admitting: Family Medicine

## 2021-11-19 ENCOUNTER — Inpatient Hospital Stay (HOSPITAL_COMMUNITY)
Admission: EM | Admit: 2021-11-19 | Discharge: 2021-11-22 | DRG: 683 | Disposition: A | Discharge status: Home / Self Care | Payer: Medicare Other | Source: Ambulatory Visit | Attending: Family Medicine | Admitting: Family Medicine

## 2021-11-19 ENCOUNTER — Ambulatory Visit (INDEPENDENT_AMBULATORY_CARE_PROVIDER_SITE_OTHER)
Admission: RE | Admit: 2021-11-19 | Discharge: 2021-11-19 | Disposition: A | Discharge status: Home / Self Care | Payer: Medicare Other | Source: Ambulatory Visit | Attending: Family Medicine | Admitting: Family Medicine

## 2021-11-19 ENCOUNTER — Other Ambulatory Visit: Payer: Self-pay

## 2021-11-19 ENCOUNTER — Ambulatory Visit (INDEPENDENT_AMBULATORY_CARE_PROVIDER_SITE_OTHER): Payer: Medicare Other | Admitting: Family Medicine

## 2021-11-19 ENCOUNTER — Telehealth: Payer: Self-pay | Admitting: Radiology

## 2021-11-19 ENCOUNTER — Telehealth: Payer: Self-pay

## 2021-11-19 VITALS — BP 112/56 | HR 87 | Temp 97.6°F | Ht 67.0 in | Wt 212.6 lb

## 2021-11-19 DIAGNOSIS — E860 Dehydration: Secondary | ICD-10-CM | POA: Diagnosis present

## 2021-11-19 DIAGNOSIS — E785 Hyperlipidemia, unspecified: Secondary | ICD-10-CM | POA: Diagnosis not present

## 2021-11-19 DIAGNOSIS — E876 Hypokalemia: Secondary | ICD-10-CM | POA: Diagnosis present

## 2021-11-19 DIAGNOSIS — J449 Chronic obstructive pulmonary disease, unspecified: Secondary | ICD-10-CM | POA: Diagnosis present

## 2021-11-19 DIAGNOSIS — I6529 Occlusion and stenosis of unspecified carotid artery: Secondary | ICD-10-CM | POA: Diagnosis present

## 2021-11-19 DIAGNOSIS — E039 Hypothyroidism, unspecified: Secondary | ICD-10-CM | POA: Diagnosis present

## 2021-11-19 DIAGNOSIS — N179 Acute kidney failure, unspecified: Secondary | ICD-10-CM | POA: Diagnosis present

## 2021-11-19 DIAGNOSIS — E1121 Type 2 diabetes mellitus with diabetic nephropathy: Secondary | ICD-10-CM | POA: Diagnosis present

## 2021-11-19 DIAGNOSIS — N184 Chronic kidney disease, stage 4 (severe): Secondary | ICD-10-CM | POA: Diagnosis present

## 2021-11-19 DIAGNOSIS — E7800 Pure hypercholesterolemia, unspecified: Secondary | ICD-10-CM | POA: Diagnosis present

## 2021-11-19 DIAGNOSIS — R112 Nausea with vomiting, unspecified: Secondary | ICD-10-CM

## 2021-11-19 DIAGNOSIS — D631 Anemia in chronic kidney disease: Secondary | ICD-10-CM | POA: Diagnosis present

## 2021-11-19 DIAGNOSIS — N189 Chronic kidney disease, unspecified: Secondary | ICD-10-CM | POA: Diagnosis present

## 2021-11-19 DIAGNOSIS — I10 Essential (primary) hypertension: Secondary | ICD-10-CM | POA: Diagnosis not present

## 2021-11-19 DIAGNOSIS — L859 Epidermal thickening, unspecified: Secondary | ICD-10-CM | POA: Diagnosis present

## 2021-11-19 DIAGNOSIS — E1122 Type 2 diabetes mellitus with diabetic chronic kidney disease: Secondary | ICD-10-CM | POA: Diagnosis present

## 2021-11-19 DIAGNOSIS — I5032 Chronic diastolic (congestive) heart failure: Secondary | ICD-10-CM | POA: Diagnosis present

## 2021-11-19 DIAGNOSIS — Z9861 Coronary angioplasty status: Secondary | ICD-10-CM | POA: Diagnosis not present

## 2021-11-19 DIAGNOSIS — I130 Hypertensive heart and chronic kidney disease with heart failure and stage 1 through stage 4 chronic kidney disease, or unspecified chronic kidney disease: Secondary | ICD-10-CM | POA: Diagnosis present

## 2021-11-19 DIAGNOSIS — E11319 Type 2 diabetes mellitus with unspecified diabetic retinopathy without macular edema: Secondary | ICD-10-CM | POA: Diagnosis present

## 2021-11-19 DIAGNOSIS — Z7982 Long term (current) use of aspirin: Secondary | ICD-10-CM

## 2021-11-19 DIAGNOSIS — E1169 Type 2 diabetes mellitus with other specified complication: Secondary | ICD-10-CM | POA: Diagnosis present

## 2021-11-19 DIAGNOSIS — F17210 Nicotine dependence, cigarettes, uncomplicated: Secondary | ICD-10-CM | POA: Diagnosis present

## 2021-11-19 DIAGNOSIS — E1142 Type 2 diabetes mellitus with diabetic polyneuropathy: Secondary | ICD-10-CM

## 2021-11-19 DIAGNOSIS — Z794 Long term (current) use of insulin: Secondary | ICD-10-CM | POA: Diagnosis not present

## 2021-11-19 DIAGNOSIS — Z79899 Other long term (current) drug therapy: Secondary | ICD-10-CM

## 2021-11-19 DIAGNOSIS — Z8249 Family history of ischemic heart disease and other diseases of the circulatory system: Secondary | ICD-10-CM

## 2021-11-19 DIAGNOSIS — I2510 Atherosclerotic heart disease of native coronary artery without angina pectoris: Secondary | ICD-10-CM | POA: Diagnosis present

## 2021-11-19 DIAGNOSIS — E1165 Type 2 diabetes mellitus with hyperglycemia: Secondary | ICD-10-CM

## 2021-11-19 DIAGNOSIS — R1013 Epigastric pain: Secondary | ICD-10-CM | POA: Diagnosis not present

## 2021-11-19 DIAGNOSIS — Z8673 Personal history of transient ischemic attack (TIA), and cerebral infarction without residual deficits: Secondary | ICD-10-CM

## 2021-11-19 DIAGNOSIS — E872 Acidosis, unspecified: Secondary | ICD-10-CM | POA: Diagnosis present

## 2021-11-19 DIAGNOSIS — Z96651 Presence of right artificial knee joint: Secondary | ICD-10-CM | POA: Diagnosis present

## 2021-11-19 DIAGNOSIS — Z823 Family history of stroke: Secondary | ICD-10-CM

## 2021-11-19 DIAGNOSIS — Z79890 Hormone replacement therapy (postmenopausal): Secondary | ICD-10-CM

## 2021-11-19 DIAGNOSIS — Z955 Presence of coronary angioplasty implant and graft: Secondary | ICD-10-CM

## 2021-11-19 DIAGNOSIS — E119 Type 2 diabetes mellitus without complications: Secondary | ICD-10-CM

## 2021-11-19 DIAGNOSIS — Z888 Allergy status to other drugs, medicaments and biological substances status: Secondary | ICD-10-CM

## 2021-11-19 LAB — BASIC METABOLIC PANEL
BUN: 81 mg/dL — ABNORMAL HIGH (ref 6–23)
CO2: 31 mEq/L (ref 19–32)
Calcium: 9.2 mg/dL (ref 8.4–10.5)
Chloride: 95 mEq/L — ABNORMAL LOW (ref 96–112)
Creatinine, Ser: 4.05 mg/dL — ABNORMAL HIGH (ref 0.40–1.20)
GFR: 10.95 mL/min — CL (ref >60.00)
Glucose, Bld: 187 mg/dL — ABNORMAL HIGH (ref 70–99)
Potassium: 3.4 mEq/L — ABNORMAL LOW (ref 3.5–5.1)
Sodium: 136 mEq/L (ref 135–145)

## 2021-11-19 LAB — COMPREHENSIVE METABOLIC PANEL
ALT: 19 U/L (ref 0–44)
AST: 17 U/L (ref 15–41)
Albumin: 2.8 g/dL — ABNORMAL LOW (ref 3.5–5.0)
Alkaline Phosphatase: 70 U/L (ref 38–126)
Anion gap: 7 (ref 5–15)
BUN: 85 mg/dL — ABNORMAL HIGH (ref 8–23)
CO2: 29 mmol/L (ref 22–32)
Calcium: 8.6 mg/dL — ABNORMAL LOW (ref 8.9–10.3)
Chloride: 99 mmol/L (ref 98–111)
Creatinine, Ser: 4.1 mg/dL — ABNORMAL HIGH (ref 0.44–1.00)
GFR, Estimated: 11 mL/min — ABNORMAL LOW (ref >60)
Glucose, Bld: 294 mg/dL — ABNORMAL HIGH (ref 70–99)
Potassium: 3.5 mmol/L (ref 3.5–5.1)
Sodium: 135 mmol/L (ref 135–145)
Total Bilirubin: 0.4 mg/dL (ref 0.3–1.2)
Total Protein: 5.4 g/dL — ABNORMAL LOW (ref 6.5–8.1)

## 2021-11-19 LAB — CBC WITH DIFFERENTIAL/PLATELET
Abs Immature Granulocytes: 0.02 10*3/uL (ref 0.00–0.07)
Basophils Absolute: 0.1 10*3/uL (ref 0.0–0.1)
Basophils Absolute: 0 10*3/uL (ref 0.0–0.1)
Basophils Relative: 0.6 % (ref 0.0–3.0)
Basophils Relative: 0 %
Eosinophils Absolute: 0.1 10*3/uL (ref 0.0–0.7)
Eosinophils Absolute: 0.1 10*3/uL (ref 0.0–0.5)
Eosinophils Relative: 1 % (ref 0.0–5.0)
Eosinophils Relative: 1 %
HCT: 33.7 % — ABNORMAL LOW (ref 36.0–46.0)
HCT: 33.2 % — ABNORMAL LOW (ref 36.0–46.0)
Hemoglobin: 11.2 g/dL — ABNORMAL LOW (ref 12.0–15.0)
Hemoglobin: 10.9 g/dL — ABNORMAL LOW (ref 12.0–15.0)
Immature Granulocytes: 0 %
Lymphocytes Relative: 15.7 % (ref 12.0–46.0)
Lymphocytes Relative: 20 %
Lymphs Abs: 1.3 10*3/uL (ref 0.7–4.0)
Lymphs Abs: 1.4 10*3/uL (ref 0.7–4.0)
MCH: 27.5 pg (ref 26.0–34.0)
MCHC: 33.2 g/dL (ref 30.0–36.0)
MCHC: 32.8 g/dL (ref 30.0–36.0)
MCV: 82.3 fl (ref 78.0–100.0)
MCV: 83.8 fL (ref 80.0–100.0)
Monocytes Absolute: 0.5 10*3/uL (ref 0.1–1.0)
Monocytes Absolute: 0.4 10*3/uL (ref 0.1–1.0)
Monocytes Relative: 5.8 % (ref 3.0–12.0)
Monocytes Relative: 6 %
Neutro Abs: 6.4 10*3/uL (ref 1.4–7.7)
Neutro Abs: 5 10*3/uL (ref 1.7–7.7)
Neutrophils Relative %: 76.9 % (ref 43.0–77.0)
Neutrophils Relative %: 73 %
Platelets: 265 10*3/uL (ref 150.0–400.0)
Platelets: 236 10*3/uL (ref 150–400)
RBC: 4.1 Mil/uL (ref 3.87–5.11)
RBC: 3.96 MIL/uL (ref 3.87–5.11)
RDW: 14.8 % (ref 11.5–15.5)
RDW: 14 % (ref 11.5–15.5)
WBC: 8.4 10*3/uL (ref 4.0–10.5)
WBC: 6.9 10*3/uL (ref 4.0–10.5)
nRBC: 0 % (ref 0.0–0.2)

## 2021-11-19 LAB — HEPATIC FUNCTION PANEL
ALT: 16 U/L (ref 0–35)
AST: 13 U/L (ref 0–37)
Albumin: 3.3 g/dL — ABNORMAL LOW (ref 3.5–5.2)
Alkaline Phosphatase: 79 U/L (ref 39–117)
Bilirubin, Direct: 0.1 mg/dL (ref 0.0–0.3)
Total Bilirubin: 0.4 mg/dL (ref 0.2–1.2)
Total Protein: 5.8 g/dL — ABNORMAL LOW (ref 6.0–8.3)

## 2021-11-19 LAB — CBG MONITORING, ED
Glucose-Capillary: 288 mg/dL — ABNORMAL HIGH (ref 70–99)
Glucose-Capillary: 227 mg/dL — ABNORMAL HIGH (ref 70–99)

## 2021-11-19 LAB — POC URINALSYSI DIPSTICK (AUTOMATED)
Bilirubin, UA: NEGATIVE
Blood, UA: NEGATIVE
Glucose, UA: NEGATIVE
Ketones, UA: NEGATIVE
Leukocytes, UA: NEGATIVE
Nitrite, UA: NEGATIVE
Protein, UA: POSITIVE — AB
Spec Grav, UA: 1.02 (ref 1.010–1.025)
Urobilinogen, UA: 0.2 E.U./dL
pH, UA: 6 (ref 5.0–8.0)

## 2021-11-19 LAB — LIPASE: Lipase: 15 U/L (ref 11.0–59.0)

## 2021-11-19 LAB — BETA-HYDROXYBUTYRIC ACID: Beta-Hydroxybutyric Acid: 0.1 mmol/L (ref 0.05–0.27)

## 2021-11-19 MED ORDER — HYDRALAZINE HCL 20 MG/ML IJ SOLN: 5.0000 mg | Freq: Four times a day (QID) | INTRAMUSCULAR | Status: DC | PRN

## 2021-11-19 MED ORDER — LACTATED RINGERS IV SOLN: INTRAVENOUS | Status: DC

## 2021-11-19 MED ORDER — ACETAMINOPHEN 325 MG PO TABS: 650.0000 mg | ORAL_TABLET | Freq: Four times a day (QID) | ORAL | Status: DC | PRN

## 2021-11-19 MED ORDER — HEPARIN SODIUM (PORCINE) 5000 UNIT/ML IJ SOLN
5000.0000 [IU] | Freq: Three times a day (TID) | INTRAMUSCULAR | Status: DC
  Administered 2021-11-19 – 2021-11-20 (×3): 5000 [IU] via SUBCUTANEOUS
  Filled 2021-11-19 (×3): qty 1

## 2021-11-19 MED ORDER — LEVOTHYROXINE SODIUM 137 MCG PO TABS
137.0000 ug | ORAL_TABLET | Freq: Every day | ORAL | Status: DC
  Administered 2021-11-20 – 2021-11-22 (×3): 137 ug via ORAL
  Filled 2021-11-19 (×3): qty 1

## 2021-11-19 MED ORDER — INSULIN ASPART 100 UNIT/ML IJ SOLN
0.0000 [IU] | Freq: Every day | INTRAMUSCULAR | Status: DC
  Administered 2021-11-19: 2 [IU] via SUBCUTANEOUS
  Filled 2021-11-19: qty 1

## 2021-11-19 MED ORDER — NITROGLYCERIN 0.4 MG SL SUBL: 0.4000 mg | SUBLINGUAL_TABLET | SUBLINGUAL | Status: DC | PRN

## 2021-11-19 MED ORDER — INSULIN GLARGINE-YFGN 100 UNIT/ML ~~LOC~~ SOLN
10.0000 [IU] | Freq: Two times a day (BID) | SUBCUTANEOUS | Status: DC
  Administered 2021-11-19 – 2021-11-22 (×6): 10 [IU] via SUBCUTANEOUS
  Filled 2021-11-19 (×8): qty 0.1

## 2021-11-19 MED ORDER — ATORVASTATIN CALCIUM 40 MG PO TABS
80.0000 mg | ORAL_TABLET | Freq: Every day | ORAL | Status: DC
  Administered 2021-11-19 – 2021-11-22 (×4): 80 mg via ORAL
  Filled 2021-11-19 (×4): qty 2

## 2021-11-19 MED ORDER — ASPIRIN 81 MG PO TBEC
81.0000 mg | DELAYED_RELEASE_TABLET | Freq: Two times a day (BID) | ORAL | Status: DC
  Administered 2021-11-19 – 2021-11-22 (×6): 81 mg via ORAL
  Filled 2021-11-19 (×6): qty 1

## 2021-11-19 MED ORDER — BUPROPION HCL ER (SR) 150 MG PO TB12
150.0000 mg | ORAL_TABLET | Freq: Two times a day (BID) | ORAL | Status: DC
Start: 2021-11-19 — End: 2021-11-19

## 2021-11-19 MED ORDER — PROMETHAZINE HCL 25 MG PO TABS: 25.0000 mg | ORAL_TABLET | Freq: Three times a day (TID) | ORAL | 0 refills | Status: DC | PRN

## 2021-11-19 MED ORDER — EZETIMIBE 10 MG PO TABS
10.0000 mg | ORAL_TABLET | Freq: Every day | ORAL | Status: DC
  Administered 2021-11-19 – 2021-11-22 (×4): 10 mg via ORAL
  Filled 2021-11-19 (×4): qty 1

## 2021-11-19 MED ORDER — ACETAMINOPHEN 650 MG RE SUPP: 650.0000 mg | Freq: Four times a day (QID) | RECTAL | Status: DC | PRN

## 2021-11-19 MED ORDER — DULOXETINE HCL 60 MG PO CPEP
60.0000 mg | ORAL_CAPSULE | Freq: Every day | ORAL | Status: DC
  Administered 2021-11-19 – 2021-11-22 (×4): 60 mg via ORAL
  Filled 2021-11-19: qty 2
  Filled 2021-11-19 (×3): qty 1

## 2021-11-19 MED ORDER — FERROUS SULFATE 325 (65 FE) MG PO TABS
325.0000 mg | ORAL_TABLET | Freq: Every day | ORAL | Status: DC
  Administered 2021-11-20 – 2021-11-22 (×3): 325 mg via ORAL
  Filled 2021-11-19 (×3): qty 1

## 2021-11-19 MED ORDER — INSULIN ASPART 100 UNIT/ML IJ SOLN
0.0000 [IU] | Freq: Three times a day (TID) | INTRAMUSCULAR | Status: DC
  Administered 2021-11-20: 8 [IU] via SUBCUTANEOUS
  Administered 2021-11-21: 2 [IU] via SUBCUTANEOUS
  Administered 2021-11-21: 3 [IU] via SUBCUTANEOUS

## 2021-11-19 MED ORDER — SODIUM CHLORIDE 0.9 % IV BOLUS
1000.0000 mL | Freq: Once | INTRAVENOUS | Status: AC
  Administered 2021-11-19: 1000 mL via INTRAVENOUS

## 2021-11-19 MED ORDER — CARVEDILOL 12.5 MG PO TABS
12.5000 mg | ORAL_TABLET | Freq: Two times a day (BID) | ORAL | Status: DC
  Administered 2021-11-20 – 2021-11-22 (×5): 12.5 mg via ORAL
  Filled 2021-11-19 (×5): qty 1

## 2021-11-19 NOTE — Telephone Encounter (Signed)
Aware Dehydrated Sent to the ER

## 2021-11-19 NOTE — Telephone Encounter (Signed)
Called and lvm on both of pt's daughter vm  to call us back asap.

## 2021-11-19 NOTE — Telephone Encounter (Signed)
-----   Message from Abner Greenspan, MD sent at 11/19/2021  3:42 PM EDT ----- Kidney function is much worse  This may be from or causing the nausea  Most likely dehydrated  I recommend she goes to the ER for further evaluation and likely fluids

## 2021-11-19 NOTE — Telephone Encounter (Signed)
[facility] called critical results, GFR -[id number]  Results given to Dr. Glori Bickers

## 2021-11-19 NOTE — H&P (Addendum)
TRH H&P   Patient Demographics:    Jennifer [name], is a 66 y.o. female  MRN: [id number]   DOB - 09/30/1955  Admit Date - 11/19/2021  Outpatient Primary MD for the patient is [name], [name], MD  Referring MD/NP/PA: Memory Argue  Outpatient Specialists: renal Dr Harl Favor    Patient coming from: home  Chief Complaint  Patient presents with   Emesis      HPI:    Jennifer [name]  is a 66 y.o. female, with past medical history of CAD, diabetes mellitus, hypertension, diabetic nephropathy, -Patient presents to ED secondary to nausea and vomiting, her symptoms has been going on for last week with nausea and vomiting, but overall she been having poor appetite for a month or so, she does report some intermittent abdominal pain, but she denies any chest pain, shortness of breath, fever, chills, cough or sick contact, patient reports she was with significant edema for which her diuresis has been increased by her nephrologist. -ED work-up significant for glucose 294, creatinine 4.1, BUN of 85, hemoglobin stable at 10.9, [facility] hospitalist consulted to admit.    Review of systems:    A full 10 point Review of Systems was done, except as stated above, all other Review of Systems were negative.   With Past History of the following :    Past Medical History:  Diagnosis Date   CAD (coronary artery disease)    cath 5/23 100% dist RCA lesion treated with 2 overlapping Integrity Resolute DES ( 2.25x31mm, 2.25x48mm), 60% mid RCA treated medically, 99% OM2 not amenable to PCI, 70% D1 lesion, EF normal   Hypercholesteremia    Hypertension    Hypothyroidism    Stroke ([place]) 10/2018   Thyroid disease    Type 2 diabetes mellitus ([place]) 10/08/2018      Past Surgical History:  Procedure Laterality Date   ABDOMINAL HYSTERECTOMY     CARDIAC CATHETERIZATION N/A 11/03/2015   Procedure: Left Heart Cath and  Coronary Angiography;  Surgeon: Leonie Man, MD;  Location: [facility];  Service: Cardiovascular;  Laterality: N/A;   CARDIAC CATHETERIZATION N/A 11/03/2015   Procedure: Coronary Stent Intervention;  Surgeon: Leonie Man, MD;  Location: [facility];  Service: Cardiovascular;  Laterality: N/A;   CARPAL TUNNEL RELEASE     CORONARY ANGIOPLASTY     1 stent   TOTAL KNEE ARTHROPLASTY Right 04/02/2019   Procedure: TOTAL KNEE ARTHROPLASTY;  Surgeon: Carole Civil, MD;  Location: [facility];  Service: Orthopedics;  Laterality: Right;      Social History:     Social History   Tobacco Use   Smoking status: Some Days    Packs/day: 0.25    Types: Cigarettes    Last attempt to quit: 05/13/2021    Years since quitting: 0.5   Smokeless tobacco: Never  Substance Use Topics   Alcohol  use: No    Alcohol/week: 0.0 standard drinks of alcohol     Family History :     Family History  Problem Relation Age of Onset   Heart disease Mother    Stroke Sister    Heart attack Brother       Home Medications:   Prior to Admission medications   Medication Sig Start Date End Date Taking? Authorizing Provider  amLODipine (NORVASC) 10 MG tablet Take 1 tablet (10 mg total) by mouth daily. 03/22/21 09/18/21  Verta Ellen., NP  aspirin EC 81 MG tablet Take 1 tablet (81 mg total) by mouth 2 (two) times daily. Hold aspirin while you are on apixaban/Eliquis 04/06/19   Emokpae, Courage, MD  atorvastatin (LIPITOR) 80 MG tablet TAKE 1 TABLET BY MOUTH ONCE DAILY 6 IN THE EVENING 03/22/21   Verta Ellen., NP  BD PEN NEEDLE NANO 2ND GEN 32G X 4 MM MISC Inject into the skin 3 (three) times daily. 07/22/21   [provider]  buPROPion (WELLBUTRIN SR) 150 MG 12 hr tablet Take 1 tablet (150 mg total) by mouth 2 (two) times daily. 09/27/21   [name], [name], MD  carvedilol (COREG) 12.5 MG tablet Take 1 tablet (12.5 mg total) by mouth 2 (two) times daily with a meal. 03/22/21   Verta Ellen., NP  cephALEXin (KEFLEX) 500 MG capsule Take 1 capsule (500 mg total) by mouth 2 (two) times daily. 07/13/21   [name], [name], MD  doxycycline (VIBRA-TABS) 100 MG tablet Take 1 tablet (100 mg total) by mouth 2 (two) times daily. 07/12/21   Trula Slade, DPM  DULoxetine (CYMBALTA) 60 MG capsule Take 1 capsule (60 mg total) by mouth daily. 09/27/21   [name], [name], MD  ezetimibe (ZETIA) 10 MG tablet Take 1 tablet (10 mg total) by mouth daily. 03/22/21   Verta Ellen., NP  ferrous sulfate 325 (65 FE) MG tablet Take 325 mg by mouth daily with breakfast.    [provider]  furosemide (LASIX) 40 MG tablet Take 1 tablet (40 mg total) by mouth 2 (two) times daily. 8AM, 1 PM Patient taking differently: Take 80 mg by mouth 2 (two) times daily. 8AM, 1 PM 03/22/21   Verta Ellen., NP  HUMULIN 70/30 KWIKPEN (70-30) 100 UNIT/ML KwikPen Inject into the skin. 07/19/21   [provider]  insulin aspart (NOVOLOG) 100 UNIT/ML injection 10 units twice a day 06/17/19   [provider]  Insulin Human (INSULIN PUMP) SOLN Inject into the skin continuous. Patient may bolus up to 40 units at a time, with a maximum daily limit of 120 units    [provider]  levothyroxine (SYNTHROID) 137 MCG tablet Take 137 mcg by mouth daily before breakfast.    [provider]  losartan (COZAAR) 25 MG tablet Take 0.5 tablets (12.5 mg total) by mouth daily. 06/15/21 09/13/21  Strader, Fransisco Hertz, PA-C  metolazone (ZAROXOLYN) 5 MG tablet Take 5 mg by mouth daily. Takes on M and F    [provider]  nitroGLYCERIN (NITROSTAT) 0.4 MG SL tablet Place 1 tablet (0.4 mg total) under the tongue every 5 (five) minutes x 3 doses as needed for chest pain. 03/15/18   Erlene Quan, PA-C  POTASSIUM CHLORIDE PO Take 1 tablet by mouth daily.    [provider]  promethazine (PHENERGAN) 25 MG tablet Take 1 tablet (25 mg total) by mouth every 8 (eight) hours  as needed for  nausea or vomiting. 11/19/21   [name], [name], MD     Allergies:     Allergies  Allergen Reactions   Metformin Hcl Other (See Comments)     Physical Exam:   Vitals  Blood pressure 138/69, pulse 76, temperature 98.4 F (36.9 C), temperature source Oral, resp. rate 15, SpO2 96 %.   1. General frail female, lying in bed in NAD,    2. Normal affect and insight, Not Suicidal or Homicidal, Awake Alert, Oriented X 3.  3. No F.N deficits, ALL C.Nerves Intact, Strength 5/5 all 4 extremities, Sensation intact all 4 extremities, Plantars down going.  4. Ears and Eyes appear Normal, Conjunctivae clear, PERRLA.dry Oral Mucosa.  5. Supple Neck, No JVD, No cervical lymphadenopathy appriciated, No Carotid Bruits.  6. Symmetrical Chest wall movement, Good air movement bilaterally, CTAB.  7. RRR, No Gallops, Rubs or Murmurs, No Parasternal Heave.  8. Positive Bowel Sounds, Abdomen Soft, No tenderness, No organomegaly appriciated,No rebound -guarding or rigidity.  9.  No Cyanosis, slow Skin Turgor, No Skin Rash or Bruise.  10. Good muscle tone,  joints appear normal , no effusions, Normal ROM.  11. No Palpable Lymph Nodes in Neck or Axillae     Data Review:    CBC Recent Labs  Lab 11/19/21 1012 11/19/21 1845  WBC 8.4 6.9  HGB 11.2* 10.9*  HCT 33.7* 33.2*  PLT 265.0 236  MCV 82.3 83.8  MCH  --  27.5  MCHC 33.2 32.8  RDW 14.8 14.0  LYMPHSABS 1.3 1.4  MONOABS 0.5 0.4  EOSABS 0.1 0.1  BASOSABS 0.1 0.0   ------------------------------------------------------------------------------------------------------------------  Chemistries  Recent Labs  Lab 11/19/21 1012 11/19/21 1845  NA 136 135  K 3.4* 3.5  CL 95* 99  CO2 31 29  GLUCOSE 187* 294*  BUN 81* 85*  CREATININE 4.05* 4.10*  CALCIUM 9.2 8.6*  AST 13 17  ALT 16 19  ALKPHOS 79 70  BILITOT 0.4 0.4    ------------------------------------------------------------------------------------------------------------------ estimated creatinine clearance is 16.1 mL/min (A) (by C-G formula based on SCr of 4.1 mg/dL (H)). ------------------------------------------------------------------------------------------------------------------ No results for input(s): "TSH", "T4TOTAL", "T3FREE", "THYROIDAB" in the last 72 hours.  Invalid input(s): "FREET3"  Coagulation profile No results for input(s): "INR", "PROTIME" in the last 168 hours. ------------------------------------------------------------------------------------------------------------------- No results for input(s): "DDIMER" in the last 72 hours. -------------------------------------------------------------------------------------------------------------------  Cardiac Enzymes No results for input(s): "CKMB", "TROPONINI", "MYOGLOBIN" in the last 168 hours.  Invalid input(s): "CK" ------------------------------------------------------------------------------------------------------------------    Component Value Date/Time   BNP 62.8 11/03/2015 2222     ---------------------------------------------------------------------------------------------------------------  Urinalysis    Component Value Date/Time   COLORURINE YELLOW 05/26/2019 2337   APPEARANCEUR HAZY (A) 05/26/2019 2337   LABSPEC 1.024 05/26/2019 2337   PHURINE 5.0 05/26/2019 2337   GLUCOSEU 50 (A) 05/26/2019 2337   HGBUR NEGATIVE 05/26/2019 2337   BILIRUBINUR neg 11/19/2021 1015   KETONESUR 5 (A) 05/26/2019 2337   PROTEINUR Positive (A) 11/19/2021 1015   PROTEINUR >=300 (A) 05/26/2019 2337   UROBILINOGEN 0.2 11/19/2021 1015   NITRITE neg 11/19/2021 1015   NITRITE NEGATIVE 05/26/2019 2337   LEUKOCYTESUR Negative 11/19/2021 1015   LEUKOCYTESUR TRACE (A) 05/26/2019 2337     ----------------------------------------------------------------------------------------------------------------   Imaging Results:    DG Chest 2 View  Result Date: 11/19/2021 CLINICAL DATA:  Epigastric pain nausea and a smoker EXAM: CHEST - 2 VIEW COMPARISON:  Radiograph 04/04/2019 FINDINGS: The cardiomediastinal silhouette is within normal limits in size. Aortic arch calcifications. Pulmonary  hyperinflation. No focal airspace disease. No pleural effusion. No pneumothorax. There is no acute osseous abnormality. Thoracic spondylosis. IMPRESSION: No evidence of acute cardiopulmonary disease. Electronically Signed   By: Maurine Simmering M.D.   On: 11/19/2021 11:08   DG Abd 2 Views  Result Date: 11/19/2021 CLINICAL DATA:  epigastric pain with nausea and vomiting EXAM: ABDOMEN - 2 VIEW COMPARISON:  None Available. FINDINGS: No evidence of bowel obstruction. Mild-to-moderate stool burden. Right upper quadrant surgical clips. Degenerative changes of the spine. No acute osseous abnormality. No evidence of free intraperitoneal gas. IMPRESSION: No evidence of bowel obstruction. Electronically Signed   By: Maurine Simmering M.D.   On: 11/19/2021 11:07    My personal review of EKG: Rhythm NSR, Rate  73 /min, QTc 477 , no Acute ST changes   Assessment & Plan:    Principal Problem:   AKI (acute kidney injury) ([place]) Active Problems:   Diabetic peripheral neuropathy ([facility])   Hyperlipidemia associated with type 2 diabetes mellitus ([place])   Essential hypertension   COPD (chronic obstructive pulmonary disease) ([facility])   Hypothyroidism   CAD S/P percutaneous coronary angioplasty   Morbid obesity ([place])   History of CVA (cerebrovascular accident)   Type 2 diabetes mellitus ([place])   Diabetic nephropathy ([place])    AKI on CKD stage IV -Patient with known diabetic nephropathy.  Appears with worsening renal function with diuresis. -Significantly worsening creatinine and BUN recently creatinine up from 2.3> 4.1, and BUN  up from 32> 85. -To be clinically volume depleted and dehydrated, prerenal acidemia, -We will hold on diuresis for now. -Patient received 1 L of NS in ED, will keep on LR 75 cc/h x 20 hours as discussed with renal -We will hold losartan -No hyperkalemia or acidosis, no indication for emergent dialysis   Nausea and vomiting -With BUN of 85, likely related to uremia   COPD -No active wheezing, continue with nebs as needed.  History of CAD -Status post PCI -Continue with aspirin and Lipitor -Continue with Coreg   PAD/Carotid stenosis/CVA -Continue with aspirin and statin  Diabetes mellitus type 2, insulin-dependent - will check A1C,  and add sliding scale.  On Humalog 70/30, nonformulary, will continue Semglee 10 units twice daily during hospital stay and adjust dose as needed    Hypothyroidism -Continue with Synthroid  Chronic kidney disease -creatinine at baseline   Chronic diastolic CHF  Echo from April 2020 with EF of 55 to 60% with first-degree diastolic dysfunction,  -Appears to be clinically dry, monitor on IV fluids  Tobacco abuse -Patient reports she did cut down to 1 cigarettes/day, she was congratulated about that   [place] home with home health  DVT Prophylaxis Heparin   AM Labs Ordered, also please review Full Orders  Family Communication: Admission, patients condition and plan of care including tests being ordered have been discussed with the patient and both daughters at bedside  who indicate understanding and agree with the plan and Code Status.  Code Status Full  Likely DC to  Home  Condition GUARDED    Consults called: renal    Admission status: inpatient    Time spent in minutes : 70 minutes   Phillips Climes M.D on 11/19/2021 at 8:50 PM   [facility] - Office  [phone]

## 2021-11-19 NOTE — Patient Instructions (Signed)
Labs  Urine test  Xrays   Try phenergan for the nausea  Keep sipping fluids to prevent dehydration   If you feel worse- go to the ER if you need to

## 2021-11-19 NOTE — Progress Notes (Unsigned)
Subjective:    Patient ID: Jennifer [name], female    DOB: [birth year], 66 y.o.   MRN: [id number]  HPI Pt presents for nausea and vomiting   Wt Readings from Last 3 Encounters:  11/19/21 212 lb 9.6 oz (96.4 kg)  10/25/21 233 lb (105.7 kg)  07/27/21 243 lb (110.2 kg)   33.30 kg/m  Started 2 weeks ago  Thought she ate too much  (hot dogs)  Nausea Vomiting (only if she eats) , if she does not eat she is "ok"  No heartburn  No diarrhea   Took something for constipation  Miralax  It helped the constipation   Stomach ache- dull , all over  Trying to eat- not tolerating   No fever   No tick bites  No rashes    Drinking fluids , can keep down some water   Has diverticulosis on Ct in the past    Had a colonoscopy long time ago    Urinary   No urinary symptoms   Stools are dark from iron    Renal history  Had renal US in march-normal   DM with nephropathy  Sees Dr Chalmers Cater  Last a1c was 10  Is on insulin - injections  Glucose lowest was 60 last night due to not eating   Lab Results  Component Value Date   CREATININE 2.38 (H) 07/27/2021   BUN 32 (H) 07/27/2021   NA 139 07/27/2021   K 3.5 07/27/2021   CL 103 07/27/2021   CO2 28 07/27/2021   Lab Results  Component Value Date   ALT 17 07/27/2021   AST 23 07/27/2021   ALKPHOS 67 07/27/2021   BILITOT 0.4 07/27/2021   Lab Results  Component Value Date   WBC 7.0 07/27/2021   HGB 10.6 (L) 07/27/2021   HCT 33.2 (L) 07/27/2021   MCV 83.8 07/27/2021   PLT 298 07/27/2021   Patient Active Problem List   Diagnosis Date Noted   Nausea & vomiting 11/19/2021   Epigastric pain 11/19/2021   AKI (acute kidney injury) ([place]) 11/19/2021   Frequent urination 07/13/2021   Change in mental status 07/13/2021   Fatigue 07/13/2021   Headache 07/13/2021   Gait abnormality 07/13/2021   Memory loss 07/13/2021   Abnormal urinalysis 07/13/2021   Decreased GFR 07/13/2021   Normocytic anemia 07/13/2021   Diabetic  nephropathy ([place]) 05/28/2020   Diabetic retinopathy ([place]) 05/28/2020   Presence of insulin pump (external) (internal) 05/28/2020   Diabetic macular edema of right eye with proliferative retinopathy associated with type 2 diabetes mellitus ([place]) 04/16/2020   Macular pucker, right eye 04/16/2020   Vitreous hemorrhage of left eye ([place]) 04/16/2020   Pre-op examination 03/13/2020   Chronic venous insufficiency 02/06/2020   UTI (urinary tract infection) 05/28/2019   Constipation 05/28/2019   S/P TKR (total knee replacement), right 03/23/2019 05/07/2019   Primary osteoarthritis of right knee 04/02/2019   Unilateral primary osteoarthritis, right knee    Pedal edema 02/25/2019   Acute vertigo with vomiting and inability to stand 10/08/2018   Type 2 diabetes mellitus ([place]) 10/08/2018   Former smoker 10/08/2018   Carotid artery disease ([place]) 03/15/2018   History of CVA (cerebrovascular accident) 11/16/2017   Morbid obesity ([place]) 11/09/2017   Low back pain 10/26/2017   Screening mammogram, encounter for 09/18/2017   CAD S/P percutaneous coronary angioplasty 11/04/2015   NSTEMI (non-ST elevated myocardial infarction) ([place]) 11/03/2015   Hypothyroidism 07/06/2010   Hyperlipidemia associated with type 2 diabetes mellitus (  [place]) 08/28/2008   Essential hypertension 11/13/2007   Proliferative diabetic retinopathy of left eye with macular edema associated with type 2 diabetes mellitus ([place]) 09/21/2006   Diabetic peripheral neuropathy ([place]) 09/21/2006   Adjustment disorder with mixed anxiety and depressed mood 09/21/2006   CARPAL TUNNEL SYNDROME 09/21/2006   COPD (chronic obstructive pulmonary disease) ([place]) 09/21/2006   EDEMA 09/21/2006   MIGRAINES, HX OF 09/21/2006   Past Medical History:  Diagnosis Date   CAD (coronary artery disease)    cath 5/23 100% dist RCA lesion treated with 2 overlapping Integrity Resolute DES ( 2.25x74mm, 2.25x38mm), 60% mid RCA treated medically, 99% OM2 not amenable to  PCI, 70% D1 lesion, EF normal   Hypercholesteremia    Hypertension    Hypothyroidism    Stroke ([place]) 10/2018   Thyroid disease    Type 2 diabetes mellitus ([place]) 10/08/2018   Past Surgical History:  Procedure Laterality Date   ABDOMINAL HYSTERECTOMY     CARDIAC CATHETERIZATION N/A 11/03/2015   Procedure: Left Heart Cath and Coronary Angiography;  Surgeon: Leonie Man, MD;  Location: [facility];  Service: Cardiovascular;  Laterality: N/A;   CARDIAC CATHETERIZATION N/A 11/03/2015   Procedure: Coronary Stent Intervention;  Surgeon: Leonie Man, MD;  Location: [facility];  Service: Cardiovascular;  Laterality: N/A;   CARPAL TUNNEL RELEASE     CORONARY ANGIOPLASTY     1 stent   TOTAL KNEE ARTHROPLASTY Right 04/02/2019   Procedure: TOTAL KNEE ARTHROPLASTY;  Surgeon: Carole Civil, MD;  Location: [facility];  Service: Orthopedics;  Laterality: Right;   Social History   Tobacco Use   Smoking status: Some Days    Packs/day: 0.25    Types: Cigarettes    Last attempt to quit: 05/13/2021    Years since quitting: 0.5   Smokeless tobacco: Never  Vaping Use   Vaping Use: Never used  Substance Use Topics   Alcohol use: No    Alcohol/week: 0.0 standard drinks of alcohol   Drug use: No   Family History  Problem Relation Age of Onset   Heart disease Mother    Stroke Sister    Heart attack Brother    Allergies  Allergen Reactions   Metformin Hcl Other (See Comments)   No current facility-administered medications on file prior to visit.   Current Outpatient Medications on File Prior to Visit  Medication Sig Dispense Refill   aspirin EC 81 MG tablet Take 1 tablet (81 mg total) by mouth 2 (two) times daily. Hold aspirin while you are on apixaban/Eliquis 1 tablet 0   atorvastatin (LIPITOR) 80 MG tablet TAKE 1 TABLET BY MOUTH ONCE DAILY 6 IN THE EVENING 90 tablet 2   BD PEN NEEDLE NANO 2ND GEN 32G X 4 MM MISC Inject into the skin 3 (three) times daily.     buPROPion  (WELLBUTRIN SR) 150 MG 12 hr tablet Take 1 tablet (150 mg total) by mouth 2 (two) times daily. 180 tablet 3   carvedilol (COREG) 12.5 MG tablet Take 1 tablet (12.5 mg total) by mouth 2 (two) times daily with a meal. 180 tablet 2   DULoxetine (CYMBALTA) 60 MG capsule Take 1 capsule (60 mg total) by mouth daily. 90 capsule 3   ezetimibe (ZETIA) 10 MG tablet Take 1 tablet (10 mg total) by mouth daily. (Patient not taking: Reported on 11/19/2021) 90 tablet 2   ferrous sulfate 325 (65 FE) MG tablet Take 325 mg by mouth daily with breakfast.  furosemide (LASIX) 40 MG tablet Take 1 tablet (40 mg total) by mouth 2 (two) times daily. 8AM, 1 PM (Patient taking differently: Take 80 mg by mouth 2 (two) times daily. 8AM, 1 PM) 180 tablet 2   HUMULIN 70/30 KWIKPEN (70-30) 100 UNIT/ML KwikPen Inject 25 Units into the skin See admin instructions. Inject 25 units in the morning and if she eats 15 units at lunch and 25 units every evening     levothyroxine (SYNTHROID) 137 MCG tablet Take 150 mcg by mouth daily before breakfast.     metolazone (ZAROXOLYN) 5 MG tablet Take 5 mg by mouth daily. Takes on M and F     nitroGLYCERIN (NITROSTAT) 0.4 MG SL tablet Place 1 tablet (0.4 mg total) under the tongue every 5 (five) minutes x 3 doses as needed for chest pain. 25 tablet 3   POTASSIUM CHLORIDE PO Take 1 tablet by mouth daily.     amLODipine (NORVASC) 10 MG tablet Take 1 tablet (10 mg total) by mouth daily. 90 tablet 2   losartan (COZAAR) 25 MG tablet Take 0.5 tablets (12.5 mg total) by mouth daily. 45 tablet 1     Review of Systems  Constitutional:  Positive for fatigue. Negative for activity change, appetite change, fever and unexpected weight change.  HENT:  Negative for congestion, ear pain, rhinorrhea, sinus pressure and sore throat.   Eyes:  Negative for pain, redness and visual disturbance.  Respiratory:  Negative for cough, shortness of breath and wheezing.   Cardiovascular:  Negative for chest pain and  palpitations.  Gastrointestinal:  Positive for abdominal pain, nausea and vomiting. Negative for abdominal distention, anal bleeding, blood in stool, constipation, diarrhea and rectal pain.       Dark stool is baseline taking iron  Endocrine: Negative for polydipsia and polyuria.  Genitourinary:  Negative for dysuria, frequency and urgency.  Musculoskeletal:  Negative for arthralgias, back pain and myalgias.  Skin:  Negative for pallor and rash.  Allergic/Immunologic: Negative for environmental allergies.  Neurological:  Negative for dizziness, syncope and headaches.  Hematological:  Negative for adenopathy. Does not bruise/bleed easily.  Psychiatric/Behavioral:  Negative for decreased concentration and dysphoric mood. The patient is not nervous/anxious.        Objective:   Physical Exam Constitutional:      General: She is not in acute distress.    Appearance: She is well-developed. She is obese. She is ill-appearing. She is not toxic-appearing or diaphoretic.  HENT:     Head: Normocephalic and atraumatic.     Mouth/Throat:     Pharynx: No oropharyngeal exudate or posterior oropharyngeal erythema.     Comments: Mouth is mildly dry Eyes:     General: No scleral icterus.    Conjunctiva/sclera: Conjunctivae normal.     Pupils: Pupils are equal, round, and reactive to light.  Neck:     Thyroid: No thyromegaly.     Vascular: No carotid bruit or JVD.  Cardiovascular:     Rate and Rhythm: Normal rate and regular rhythm.     Heart sounds: Normal heart sounds.     No gallop.  Pulmonary:     Effort: Pulmonary effort is normal. No respiratory distress.     Breath sounds: Normal breath sounds. No wheezing or rales.  Abdominal:     General: Abdomen is protuberant. Bowel sounds are normal. There is no distension or abdominal bruit.     Palpations: Abdomen is soft. There is no shifting dullness, hepatomegaly, splenomegaly, mass or pulsatile  mass.     Tenderness: There is abdominal  tenderness in the epigastric area. There is no right CVA tenderness, left CVA tenderness, guarding or rebound. Negative signs include Murphy's sign and McBurney's sign.     Comments: Very mild epigastric tenderness  Musculoskeletal:     Cervical back: Normal range of motion and neck supple.     Right lower leg: No edema.     Left lower leg: No edema.  Lymphadenopathy:     Cervical: No cervical adenopathy.  Skin:    General: Skin is warm and dry.     Coloration: Skin is not pale.     Findings: No rash.  Neurological:     Mental Status: She is alert.     Coordination: Coordination normal.     Deep Tendon Reflexes: Reflexes are normal and symmetric. Reflexes normal.  Psychiatric:        Mood and Affect: Mood normal.           Assessment & Plan:   Problem List Items Addressed This Visit       Digestive   Nausea & vomiting - Primary    2 weeks, no fever or diarrhea, poor appetite prior to that  Complex medical history incl DM and renal insuff Suspect some dehydration, but she has been able to keep fluids down (not food) Lab, cxr ua and abd xray ordered    Addendum: worsening renal function with cr over 4  Nl xrays and ua  Sent to ER for eval and likely admission      Relevant Orders   CBC with Differential/Platelet (Completed)   Basic metabolic panel (Completed)   Hepatic function panel (Completed)   Lipase (Completed)   POCT Urinalysis Dipstick (Automated) (Completed)   DG Abd 2 Views (Completed)   DG Chest 2 View (Completed)     Endocrine   Type 2 diabetes mellitus ([place])    Per pt last a1c over 10  ? If any gastroparesis Under endocrinology care           Other   Epigastric pain    With n/v Lab and abd/chest xrays ordered      Relevant Orders   CBC with Differential/Platelet (Completed)   Basic metabolic panel (Completed)   Hepatic function panel (Completed)   Lipase (Completed)   POCT Urinalysis Dipstick (Automated) (Completed)   DG Abd 2 Views  (Completed)   DG Chest 2 View (Completed)

## 2021-11-19 NOTE — ED Triage Notes (Signed)
Pt c/o vomiting x 1 week. Intermittent abd pain. Seen pcp today and was sent here due to dehydration and BUN/Creat elevated. Also done xray of chest and abd. Pt alert/oriented to most.

## 2021-11-19 NOTE — ED Provider Notes (Signed)
[facility] EMERGENCY DEPARTMENT Provider Note   CSN: [id number] Arrival date & time: 11/19/21  1715     History  Chief Complaint  Patient presents with   Emesis    Jennifer [name] is a 66 y.o. female.   Emesis   Patient with insulin-dependent diabetes, CAD, hypertension presents today due to vomiting x1 week.  States she has been unable to tolerate anything by mouth for 6 days.  Denies any abdominal pain currently but states she has had stomach pain intermittently., chest pain, shortness of breath.  She states she has been having significant lower extremity swelling which is actually improved in the last few weeks.  There has been an increase in her Lasix and they added metolazone but otherwise no recent changes in medicine.  Denies any recent travel, not having any diarrhea.  Nobody around her is sick, denies any recent antibiotic usage.  Was seen earlier today by her primary care doctor and sent to ED for dehydration due to doubling of creatinine in the office today.  S/P cholecystectomy   Home Medications Prior to Admission medications   Medication Sig Start Date End Date Taking? Authorizing Provider  amLODipine (NORVASC) 10 MG tablet Take 1 tablet (10 mg total) by mouth daily. 03/22/21 09/18/21  Verta Ellen., NP  aspirin EC 81 MG tablet Take 1 tablet (81 mg total) by mouth 2 (two) times daily. Hold aspirin while you are on apixaban/Eliquis 04/06/19   Emokpae, Courage, MD  atorvastatin (LIPITOR) 80 MG tablet TAKE 1 TABLET BY MOUTH ONCE DAILY 6 IN THE EVENING 03/22/21   Verta Ellen., NP  BD PEN NEEDLE NANO 2ND GEN 32G X 4 MM MISC Inject into the skin 3 (three) times daily. 07/22/21   [provider]  buPROPion (WELLBUTRIN SR) 150 MG 12 hr tablet Take 1 tablet (150 mg total) by mouth 2 (two) times daily. 09/27/21   [name], [name], MD  carvedilol (COREG) 12.5 MG tablet Take 1 tablet (12.5 mg total) by mouth 2 (two) times daily with a meal. 03/22/21   Verta Ellen., NP  cephALEXin (KEFLEX) 500 MG capsule Take 1 capsule (500 mg total) by mouth 2 (two) times daily. 07/13/21   [name], [name], MD  doxycycline (VIBRA-TABS) 100 MG tablet Take 1 tablet (100 mg total) by mouth 2 (two) times daily. 07/12/21   Trula Slade, DPM  DULoxetine (CYMBALTA) 60 MG capsule Take 1 capsule (60 mg total) by mouth daily. 09/27/21   [name], [name], MD  ezetimibe (ZETIA) 10 MG tablet Take 1 tablet (10 mg total) by mouth daily. 03/22/21   Verta Ellen., NP  ferrous sulfate 325 (65 FE) MG tablet Take 325 mg by mouth daily with breakfast.    [provider]  furosemide (LASIX) 40 MG tablet Take 1 tablet (40 mg total) by mouth 2 (two) times daily. 8AM, 1 PM Patient taking differently: Take 80 mg by mouth 2 (two) times daily. 8AM, 1 PM 03/22/21   Verta Ellen., NP  HUMULIN 70/30 KWIKPEN (70-30) 100 UNIT/ML KwikPen Inject into the skin. 07/19/21   [provider]  insulin aspart (NOVOLOG) 100 UNIT/ML injection 10 units twice a day 06/17/19   [provider]  Insulin Human (INSULIN PUMP) SOLN Inject into the skin continuous. Patient may bolus up to 40 units at a time, with a maximum daily limit of 120 units    [provider]  levothyroxine (SYNTHROID) 137  MCG tablet Take 137 mcg by mouth daily before breakfast.    [provider]  losartan (COZAAR) 25 MG tablet Take 0.5 tablets (12.5 mg total) by mouth daily. 06/15/21 09/13/21  Strader, Fransisco Hertz, PA-C  metolazone (ZAROXOLYN) 5 MG tablet Take 5 mg by mouth daily. Takes on M and F    [provider]  nitroGLYCERIN (NITROSTAT) 0.4 MG SL tablet Place 1 tablet (0.4 mg total) under the tongue every 5 (five) minutes x 3 doses as needed for chest pain. 03/15/18   Erlene Quan, PA-C  POTASSIUM CHLORIDE PO Take 1 tablet by mouth daily.    [provider]  promethazine (PHENERGAN) 25 MG tablet Take 1 tablet (25 mg total) by mouth every 8 (eight) hours as needed for nausea or  vomiting. 11/19/21   [name], [name], MD      Allergies    Metformin hcl    Review of Systems   Review of Systems  Gastrointestinal:  Positive for vomiting.    Physical Exam Updated Vital Signs BP 138/69 (BP Location: Right Arm)   Pulse 76   Temp 98.4 F (36.9 C) (Oral)   Resp 15   SpO2 96%  Physical Exam Vitals and nursing note reviewed. Exam conducted with a chaperone present.  Constitutional:      Appearance: Normal appearance. She is ill-appearing.  HENT:     Head: Normocephalic and atraumatic.     Mouth/Throat:     Mouth: Mucous membranes are dry.  Eyes:     General: No scleral icterus.       Right eye: No discharge.        Left eye: No discharge.     Extraocular Movements: Extraocular movements intact.     Pupils: Pupils are equal, round, and reactive to light.  Cardiovascular:     Rate and Rhythm: Normal rate and regular rhythm.     Pulses: Normal pulses.     Heart sounds: Normal heart sounds. No murmur heard.    No friction rub. No gallop.  Pulmonary:     Effort: Pulmonary effort is normal. No respiratory distress.     Breath sounds: Normal breath sounds.  Abdominal:     General: Abdomen is flat. Bowel sounds are normal. There is no distension.     Palpations: Abdomen is soft.     Tenderness: There is no abdominal tenderness.     Comments: Abdomen is soft, no focal tenderness rigidity or guarding  Musculoskeletal:     Right lower leg: Edema present.     Left lower leg: Edema present.     Comments: Bilateral pitting edema.  Skin:    General: Skin is warm and dry.     Coloration: Skin is not jaundiced.  Neurological:     Mental Status: She is alert. Mental status is at baseline.     Coordination: Coordination normal.     ED Results / Procedures / Treatments   Labs (all labs ordered are listed, but only abnormal results are displayed) Labs Reviewed  COMPREHENSIVE METABOLIC PANEL - Abnormal; Notable for the following components:      Result Value    Glucose, Bld 294 (*)    BUN 85 (*)    Creatinine, Ser 4.10 (*)    Calcium 8.6 (*)    Total Protein 5.4 (*)    Albumin 2.8 (*)    GFR, Estimated 11 (*)    All other components within normal limits  CBC WITH DIFFERENTIAL/PLATELET - Abnormal;  Notable for the following components:   Hemoglobin 10.9 (*)    HCT 33.2 (*)    All other components within normal limits  CBG MONITORING, ED - Abnormal; Notable for the following components:   Glucose-Capillary 288 (*)    All other components within normal limits  BETA-HYDROXYBUTYRIC ACID  URINALYSIS, ROUTINE W REFLEX MICROSCOPIC    EKG EKG Interpretation  Date/Time:  Friday November 19 2021 18:34:24 EDT Ventricular Rate:  73 PR Interval:  179 QRS Duration: 105 QT Interval:  432 QTC Calculation: 477 R Axis:   56 Text Interpretation: Sinus rhythm Confirmed by Thamas Jaegers ([id number]) on 11/19/2021 6:44:12 PM  Radiology DG Chest 2 View  Result Date: 11/19/2021 CLINICAL DATA:  Epigastric pain nausea and a smoker EXAM: CHEST - 2 VIEW COMPARISON:  Radiograph 04/04/2019 FINDINGS: The cardiomediastinal silhouette is within normal limits in size. Aortic arch calcifications. Pulmonary hyperinflation. No focal airspace disease. No pleural effusion. No pneumothorax. There is no acute osseous abnormality. Thoracic spondylosis. IMPRESSION: No evidence of acute cardiopulmonary disease. Electronically Signed   By: Maurine Simmering M.D.   On: 11/19/2021 11:08   DG Abd 2 Views  Result Date: 11/19/2021 CLINICAL DATA:  epigastric pain with nausea and vomiting EXAM: ABDOMEN - 2 VIEW COMPARISON:  None Available. FINDINGS: No evidence of bowel obstruction. Mild-to-moderate stool burden. Right upper quadrant surgical clips. Degenerative changes of the spine. No acute osseous abnormality. No evidence of free intraperitoneal gas. IMPRESSION: No evidence of bowel obstruction. Electronically Signed   By: Maurine Simmering M.D.   On: 11/19/2021 11:07    Procedures Procedures     Medications Ordered in ED Medications  sodium chloride 0.9 % bolus 1,000 mL (0 mLs Intravenous Stopped 11/19/21 2016)    ED Course/ Medical Decision Making/ A&P                           Medical Decision Making Amount and/or Complexity of Data Reviewed Labs: ordered.  Risk Decision regarding hospitalization.   Patient presents due to AKI.  Patient was seen by her primary earlier today secondary to a week of emesis, found to have a creatinine which is doubled in 3 months.  Differential includes overdiuresis, emergent dialysis, uremia, gross electrolyte derangement, dehydration.   I reviewed the labs and primary care note, patient's creatinine 4 compared to 2 3 months previously.  Not a dialysis patient, on exam her vitals are stable and her abdomen is soft without tender.  BP 138/69 (BP Location: Right Arm)   Pulse 76   Temp 98.4 F (36.9 C) (Oral)   Resp 15   SpO2 96%   No leukocytosis, stable anemia with hemoglobin of 10.9.  Patient does not appear in DKA.  Patient is hyperglycemic, creatinine 4.1 with an elevated BUN at 85.  I discussed patient case with Dr. Waldron Labs with with hospitalist.  He will admit the patient, I also ordered a liter of fluids due to AKI.  Did not went over hydrate given there is history of lower extremity swelling and she has bilateral pitting edema.        Final Clinical Impression(s) / ED Diagnoses Final diagnoses:  AKI (acute kidney injury) ([place])  Nausea and vomiting, unspecified vomiting type    Rx / DC Orders ED Discharge Orders     None         Sherrill Raring, Hershal Coria 11/19/21 2042    Luna Fuse, MD 11/29/21 1627

## 2021-11-20 DIAGNOSIS — N179 Acute kidney failure, unspecified: Secondary | ICD-10-CM | POA: Diagnosis not present

## 2021-11-20 DIAGNOSIS — E1142 Type 2 diabetes mellitus with diabetic polyneuropathy: Secondary | ICD-10-CM | POA: Diagnosis not present

## 2021-11-20 DIAGNOSIS — I2510 Atherosclerotic heart disease of native coronary artery without angina pectoris: Secondary | ICD-10-CM | POA: Diagnosis not present

## 2021-11-20 DIAGNOSIS — I10 Essential (primary) hypertension: Secondary | ICD-10-CM

## 2021-11-20 DIAGNOSIS — J449 Chronic obstructive pulmonary disease, unspecified: Secondary | ICD-10-CM | POA: Diagnosis not present

## 2021-11-20 LAB — URINALYSIS, ROUTINE W REFLEX MICROSCOPIC
Bacteria, UA: NONE SEEN
Bilirubin Urine: NEGATIVE
Glucose, UA: 150 mg/dL — AB
Hgb urine dipstick: NEGATIVE
Ketones, ur: NEGATIVE mg/dL
Leukocytes,Ua: NEGATIVE
Nitrite: NEGATIVE
Protein, ur: >=300 mg/dL — AB
Specific Gravity, Urine: 1.01 (ref 1.005–1.030)
pH: 7 (ref 5.0–8.0)

## 2021-11-20 LAB — GLUCOSE, CAPILLARY
Glucose-Capillary: 211 mg/dL — ABNORMAL HIGH (ref 70–99)
Glucose-Capillary: 269 mg/dL — ABNORMAL HIGH (ref 70–99)
Glucose-Capillary: 113 mg/dL — ABNORMAL HIGH (ref 70–99)
Glucose-Capillary: 144 mg/dL — ABNORMAL HIGH (ref 70–99)

## 2021-11-20 LAB — BASIC METABOLIC PANEL
Anion gap: 8 (ref 5–15)
BUN: 76 mg/dL — ABNORMAL HIGH (ref 8–23)
CO2: 27 mmol/L (ref 22–32)
Calcium: 8.6 mg/dL — ABNORMAL LOW (ref 8.9–10.3)
Chloride: 104 mmol/L (ref 98–111)
Creatinine, Ser: 3.75 mg/dL — ABNORMAL HIGH (ref 0.44–1.00)
GFR, Estimated: 13 mL/min — ABNORMAL LOW (ref >60)
Glucose, Bld: 187 mg/dL — ABNORMAL HIGH (ref 70–99)
Potassium: 3.2 mmol/L — ABNORMAL LOW (ref 3.5–5.1)
Sodium: 139 mmol/L (ref 135–145)

## 2021-11-20 LAB — CBC
HCT: 31.2 % — ABNORMAL LOW (ref 36.0–46.0)
Hemoglobin: 10.2 g/dL — ABNORMAL LOW (ref 12.0–15.0)
MCH: 27.8 pg (ref 26.0–34.0)
MCHC: 32.7 g/dL (ref 30.0–36.0)
MCV: 85 fL (ref 80.0–100.0)
Platelets: 222 10*3/uL (ref 150–400)
RBC: 3.67 MIL/uL — ABNORMAL LOW (ref 3.87–5.11)
RDW: 13.9 % (ref 11.5–15.5)
WBC: 6.6 10*3/uL (ref 4.0–10.5)
nRBC: 0 % (ref 0.0–0.2)

## 2021-11-20 LAB — HEMOGLOBIN A1C
Hgb A1c MFr Bld: 8.6 % — ABNORMAL HIGH (ref 4.8–5.6)
Mean Plasma Glucose: 200.12 mg/dL

## 2021-11-20 LAB — SODIUM, URINE, RANDOM: Sodium, Ur: 86 mmol/L

## 2021-11-20 LAB — HIV ANTIBODY (ROUTINE TESTING W REFLEX): HIV Screen 4th Generation wRfx: NONREACTIVE

## 2021-11-20 MED ORDER — LACTATED RINGERS IV SOLN: INTRAVENOUS | Status: DC

## 2021-11-20 MED ORDER — INSULIN ASPART 100 UNIT/ML IJ SOLN: 4.0000 [IU] | Freq: Three times a day (TID) | INTRAMUSCULAR | Status: DC

## 2021-11-20 MED ORDER — INSULIN ASPART 100 UNIT/ML IJ SOLN
6.0000 [IU] | Freq: Three times a day (TID) | INTRAMUSCULAR | Status: DC
  Administered 2021-11-21: 6 [IU] via SUBCUTANEOUS

## 2021-11-20 MED ORDER — APIXABAN 2.5 MG PO TABS
2.5000 mg | ORAL_TABLET | Freq: Two times a day (BID) | ORAL | Status: DC
Start: 2021-11-20 — End: 2021-11-20

## 2021-11-20 MED ORDER — BOOST / RESOURCE BREEZE PO LIQD CUSTOM
1.0000 | Freq: Three times a day (TID) | ORAL | Status: DC
  Administered 2021-11-20 – 2021-11-22 (×7): 1 via ORAL

## 2021-11-20 MED ORDER — POTASSIUM CHLORIDE CRYS ER 20 MEQ PO TBCR
20.0000 meq | EXTENDED_RELEASE_TABLET | Freq: Two times a day (BID) | ORAL | Status: AC
Start: 2021-11-20 — End: 2021-11-20
  Administered 2021-11-20 (×2): 20 meq via ORAL
  Filled 2021-11-20 (×2): qty 1

## 2021-11-20 NOTE — Hospital Course (Signed)
66 y.o. female, with past medical history of CAD, diabetes mellitus, hypertension, diabetic nephropathy, -Patient presents to ED secondary to nausea and vomiting, her symptoms has been going on for last week with nausea and vomiting, but overall she been having poor appetite for a month or so, she does report some intermittent abdominal pain, but she denies any chest pain, shortness of breath, fever, chills, cough or sick contact, patient reports she was with significant edema for which her diuresis has been increased by her nephrologist. -ED work-up significant for glucose 294, creatinine 4.1, BUN of 85, hemoglobin stable at 10.9, [facility] hospitalist consulted to admit.

## 2021-11-20 NOTE — Progress Notes (Signed)
PROGRESS NOTE   Jennifer [name]  YDX:[id number] DOB: 12-24-1955 DOA: 11/19/2021 PCP: Abner Greenspan, MD   Chief Complaint  Patient presents with   Emesis   Level of care: Telemetry  Brief Admission History:  66 y.o. female, with past medical history of CAD, diabetes mellitus, hypertension, diabetic nephropathy, -Patient presents to ED secondary to nausea and vomiting, her symptoms has been going on for last week with nausea and vomiting, but overall she been having poor appetite for a month or so, she does report some intermittent abdominal pain, but she denies any chest pain, shortness of breath, fever, chills, cough or sick contact, patient reports she was with significant edema for which her diuresis has been increased by her nephrologist. -ED work-up significant for glucose 294, creatinine 4.1, BUN of 85, hemoglobin stable at 10.9, [facility] hospitalist consulted to admit.   Assessment and Plan AKI on CKD stage IV -Patient with known diabetic nephropathy.  Appears with worsening renal function with diuresis. -Significantly worsening creatinine and BUN recently creatinine up from 2.3> 4.1, and BUN up from 32> 85. -To be clinically volume depleted and dehydrated, prerenal acidemia, -We will hold on diuresis for now. -Patient received 1 L of NS in ED, Pt responding to gentler IV fluid hydration -We will hold losartan for now -No hyperkalemia or acidosis, no indication for emergent dialysis    Nausea and vomiting -improved to resolved now after IV fluid   COPD -No active wheezing, continue with nebs as needed.   History of CAD -Status post PCI -Continue with aspirin and Lipitor -Continue with Coreg   PAD/Carotid stenosis/CVA -Continue with aspirin and statin   Diabetes mellitus type 2, insulin-dependent Added prandial coverage with meals  CBG (last 3)  Recent Labs    11/19/21 2226 11/20/21 0909 11/20/21 1107  GLUCAP 227* 211* 269*     Hypothyroidism -Continue with  Synthroid   Chronic kidney disease -creatinine at baseline    Chronic diastolic CHF  Echo from April 2020 with EF of 55 to 60% with first-degree diastolic dysfunction,  -Appears to be clinically dry, monitor on IV fluids   Tobacco abuse -Patient reports she did cut down to 1 cigarettes/day, she was congratulated about that   [place] home with home health  DVT Prophylaxis Heparin     Code Status: full  Family Communication: daughter Disposition: Status is: Inpatient Remains inpatient appropriate because: IV fluids    Consultants:  nephrology Procedures:   Antimicrobials:    Subjective: Pt reports that she would like to go home overall no emesis but wants to advance diet today.  Nausea is resolved after IV fluid.  Objective: Vitals:   11/20/21 0630 11/20/21 0847 11/20/21 1158 11/20/21 1355  BP: (!) 155/71 (!) 169/90  137/72  Pulse: 76 89  71  Resp: 16 15  16   Temp:  98.8 F (37.1 C)  98.6 F (37 C)  TempSrc:  Oral  Oral  SpO2: 92% 99%  95%  Weight:   98.1 kg   Height:   5\' 7"  (1.702 m)     Intake/Output Summary (Last 24 hours) at 11/20/2021 1507 Last data filed at 11/20/2021 1300 Gross per 24 hour  Intake 2118.87 ml  Output --  Net 2118.87 ml   Filed Weights   11/20/21 1158  Weight: 98.1 kg   Examination:  General exam: Appears calm and comfortable  Respiratory system: Clear to auscultation. Respiratory effort normal. Cardiovascular system: normal S1 & S2 heard. No JVD, murmurs, rubs, gallops  or clicks. No pedal edema. Gastrointestinal system: Abdomen is nondistended, soft and nontender. No organomegaly or masses felt. Normal bowel sounds heard. Central nervous system: Alert and oriented. No focal neurological deficits. Extremities: Symmetric 5 x 5 power. Skin: No rashes, lesions or ulcers. Psychiatry: Judgement and insight appear normal. Mood & affect appropriate.   Data Reviewed: I have personally reviewed following labs and imaging  studies  CBC: Recent Labs  Lab 11/19/21 1012 11/19/21 1845 11/20/21 0608  WBC 8.4 6.9 6.6  NEUTROABS 6.4 5.0  --   HGB 11.2* 10.9* 10.2*  HCT 33.7* 33.2* 31.2*  MCV 82.3 83.8 85.0  PLT 265.0 236 782    Basic Metabolic Panel: Recent Labs  Lab 11/19/21 1012 11/19/21 1845 11/20/21 0608  NA 136 135 139  K 3.4* 3.5 3.2*  CL 95* 99 104  CO2 31 29 27   GLUCOSE 187* 294* 187*  BUN 81* 85* 76*  CREATININE 4.05* 4.10* 3.75*  CALCIUM 9.2 8.6* 8.6*    CBG: Recent Labs  Lab 11/19/21 1801 11/19/21 2226 11/20/21 0909 11/20/21 1107  GLUCAP 288* 227* 211* 269*    No results found for this or any previous visit (from the past 240 hour(s)).   Radiology Studies: DG Chest 2 View  Result Date: 11/19/2021 CLINICAL DATA:  Epigastric pain nausea and a smoker EXAM: CHEST - 2 VIEW COMPARISON:  Radiograph 04/04/2019 FINDINGS: The cardiomediastinal silhouette is within normal limits in size. Aortic arch calcifications. Pulmonary hyperinflation. No focal airspace disease. No pleural effusion. No pneumothorax. There is no acute osseous abnormality. Thoracic spondylosis. IMPRESSION: No evidence of acute cardiopulmonary disease. Electronically Signed   By: Maurine Simmering M.D.   On: 11/19/2021 11:08   DG Abd 2 Views  Result Date: 11/19/2021 CLINICAL DATA:  epigastric pain with nausea and vomiting EXAM: ABDOMEN - 2 VIEW COMPARISON:  None Available. FINDINGS: No evidence of bowel obstruction. Mild-to-moderate stool burden. Right upper quadrant surgical clips. Degenerative changes of the spine. No acute osseous abnormality. No evidence of free intraperitoneal gas. IMPRESSION: No evidence of bowel obstruction. Electronically Signed   By: Maurine Simmering M.D.   On: 11/19/2021 11:07    Scheduled Meds:  aspirin EC  81 mg Oral BID   atorvastatin  80 mg Oral Daily   carvedilol  12.5 mg Oral BID WC   DULoxetine  60 mg Oral Daily   ezetimibe  10 mg Oral Daily   feeding supplement  1 Container Oral TID BM    ferrous sulfate  325 mg Oral Q breakfast   insulin aspart  0-15 Units Subcutaneous TID WC   insulin aspart  0-5 Units Subcutaneous QHS   insulin aspart  4 Units Subcutaneous TID WC   insulin glargine-yfgn  10 Units Subcutaneous BID   levothyroxine  137 mcg Oral QAC breakfast   potassium chloride  20 mEq Oral BID   Continuous Infusions:  lactated ringers 70 mL/hr at 11/20/21 1458     LOS: 1 day   Time spent: 35 mins  Caci Orren Wynetta Emery, MD How to contact the [facility] Attending or Consulting provider [place] or covering provider during after hours [place], for this patient?  Check the care team in [facility] and look for a) attending/consulting [facility] provider listed and b) the [facility] team listed Log into [email or web address] and use [place]'s universal password to access. If you do not have the password, please contact the hospital operator. Locate the [facility] provider you are looking for under [facility] and page to  a number that you can be directly reached. If you still have difficulty reaching the provider, please page the [facility] (Director on Call) for the Hospitalists listed on amion for assistance.  11/20/2021, 3:07 PM

## 2021-11-20 NOTE — Consult Note (Signed)
Reason for Consult: Acute kidney injury on chronic kidney disease stage IV Referring Physician: Irwin Brakeman MD [facility])  HPI:  66 year old woman with past medical history significant for hypertension, type 2 diabetes mellitus complicated by retinopathy and neuropathy, former smoker, dyslipidemia, coronary artery disease status post PTCA, history of CVA, hypothyroidism and chronic kidney disease stage IV (baseline creatinine 2.2-2.6) who is followed by Dr. Carolin Sicks at [facility].  She presented to the emergency room yesterday with progressively worsening nausea, vomiting and decreased oral intake with some intermittent abdominal pain.  She denied any diarrhea or hematochezia/melena and did not have any fevers or chills.  She denies any urinary complaints including dysuria, urgency or frequency.  She had recent addition of metolazone to her furosemide 80 mg twice daily to help augment volume unloading/diuresis last month and states that her weight has decreased from about 260 pounds to 214 pounds over the past few weeks.  She reports to have been adherent with the rest of her medications including losartan.  She denies any cough or sputum production and remains abstinent from cigarettes.  She reports significant improvement of her pedal edema now with "ankles back to normal".  Past Medical History:  Diagnosis Date   CAD (coronary artery disease)    cath 5/23 100% dist RCA lesion treated with 2 overlapping Integrity Resolute DES ( 2.25x35mm, 2.25x90mm), 60% mid RCA treated medically, 99% OM2 not amenable to PCI, 70% D1 lesion, EF normal   Hypercholesteremia    Hypertension    Hypothyroidism    Stroke ([place]) 10/2018   Thyroid disease    Type 2 diabetes mellitus ([place]) 10/08/2018    Past Surgical History:  Procedure Laterality Date   ABDOMINAL HYSTERECTOMY     CARDIAC CATHETERIZATION N/A 11/03/2015   Procedure: Left Heart Cath and Coronary Angiography;  Surgeon: Leonie Man, MD;   Location: [place] [facility];  Service: Cardiovascular;  Laterality: N/A;   CARDIAC CATHETERIZATION N/A 11/03/2015   Procedure: Coronary Stent Intervention;  Surgeon: Leonie Man, MD;  Location: [place] [facility];  Service: Cardiovascular;  Laterality: N/A;   CARPAL TUNNEL RELEASE     CORONARY ANGIOPLASTY     1 stent   TOTAL KNEE ARTHROPLASTY Right 04/02/2019   Procedure: TOTAL KNEE ARTHROPLASTY;  Surgeon: Carole Civil, MD;  Location: [facility];  Service: Orthopedics;  Laterality: Right;    Family History  Problem Relation Age of Onset   Heart disease Mother    Stroke Sister    Heart attack Brother     Social History:  reports that she has been smoking cigarettes. She has been smoking an average of .25 packs per day. She has never used smokeless tobacco. She reports that she does not drink alcohol and does not use drugs.  Allergies:  Allergies  Allergen Reactions   Metformin Hcl Other (See Comments)    Medications: I have reviewed the patient's current medications. Scheduled:  aspirin EC  81 mg Oral BID   atorvastatin  80 mg Oral Daily   carvedilol  12.5 mg Oral BID WC   DULoxetine  60 mg Oral Daily   ezetimibe  10 mg Oral Daily   feeding supplement  1 Container Oral TID BM   ferrous sulfate  325 mg Oral Q breakfast   heparin  5,000 Units Subcutaneous Q8H   insulin aspart  0-15 Units Subcutaneous TID WC   insulin aspart  0-5 Units Subcutaneous QHS   insulin glargine-yfgn  10 Units Subcutaneous BID  levothyroxine  137 mcg Oral QAC breakfast      Latest Ref Rng & Units 11/20/2021    6:08 AM 11/19/2021    6:45 PM 11/19/2021   10:12 AM  BMP  Glucose 70 - 99 mg/dL 187  294  187   BUN 8 - 23 mg/dL 76  85  81   Creatinine 0.44 - 1.00 mg/dL 3.75  4.10  4.05   Sodium 135 - 145 mmol/L 139  135  136   Potassium 3.5 - 5.1 mmol/L 3.2  3.5  3.4   Chloride 98 - 111 mmol/L 104  99  95   CO2 22 - 32 mmol/L 27  29  31    Calcium 8.9 - 10.3 mg/dL 8.6  8.6  9.2       Latest Ref  Rng & Units 11/20/2021    6:08 AM 11/19/2021    6:45 PM 11/19/2021   10:12 AM  CBC  WBC 4.0 - 10.5 K/uL 6.6  6.9  8.4   Hemoglobin 12.0 - 15.0 g/dL 10.2  10.9  11.2   Hematocrit 36.0 - 46.0 % 31.2  33.2  33.7   Platelets 150 - 400 K/uL 222  236  265.0      DG Chest 2 View  Result Date: 11/19/2021 CLINICAL DATA:  Epigastric pain nausea and a smoker EXAM: CHEST - 2 VIEW COMPARISON:  Radiograph 04/04/2019 FINDINGS: The cardiomediastinal silhouette is within normal limits in size. Aortic arch calcifications. Pulmonary hyperinflation. No focal airspace disease. No pleural effusion. No pneumothorax. There is no acute osseous abnormality. Thoracic spondylosis. IMPRESSION: No evidence of acute cardiopulmonary disease. Electronically Signed   By: Maurine Simmering M.D.   On: 11/19/2021 11:08   DG Abd 2 Views  Result Date: 11/19/2021 CLINICAL DATA:  epigastric pain with nausea and vomiting EXAM: ABDOMEN - 2 VIEW COMPARISON:  None Available. FINDINGS: No evidence of bowel obstruction. Mild-to-moderate stool burden. Right upper quadrant surgical clips. Degenerative changes of the spine. No acute osseous abnormality. No evidence of free intraperitoneal gas. IMPRESSION: No evidence of bowel obstruction. Electronically Signed   By: Maurine Simmering M.D.   On: 11/19/2021 11:07    Review of Systems  Constitutional:  Positive for appetite change and fatigue. Negative for chills and fever.  HENT:  Negative for hearing loss, nosebleeds, sore throat and trouble swallowing.   Eyes:  Negative for photophobia and visual disturbance.  Respiratory:  Negative for cough, shortness of breath and wheezing.   Cardiovascular:  Negative for chest pain and leg swelling.  Gastrointestinal:  Positive for nausea and vomiting. Negative for abdominal pain.  Genitourinary:  Negative for dysuria, frequency, hematuria and urgency.  Musculoskeletal:  Negative for back pain and myalgias.  Skin:  Negative for pallor and wound.  Neurological:   Negative for dizziness, weakness and headaches.   Blood pressure (!) 169/90, pulse 89, temperature 98.8 F (37.1 C), temperature source Oral, resp. rate 15, SpO2 99 %. Physical Exam Vitals and nursing note reviewed.  Constitutional:      General: She is not in acute distress.    Appearance: Normal appearance. She is obese. She is not ill-appearing.  HENT:     Head: Atraumatic.     Right Ear: External ear normal.     Left Ear: External ear normal.     Nose: Nose normal.     Mouth/Throat:     Mouth: Mucous membranes are dry.     Pharynx: Oropharynx is clear.  Eyes:  General: No scleral icterus.    Extraocular Movements: Extraocular movements intact.     Conjunctiva/sclera: Conjunctivae normal.  Cardiovascular:     Rate and Rhythm: Normal rate and regular rhythm.     Pulses: Normal pulses.     Heart sounds: Normal heart sounds.  Pulmonary:     Effort: Pulmonary effort is normal.     Breath sounds: Wheezing present.     Comments: Expiratory wheeze intermittently Abdominal:     General: Abdomen is flat. Bowel sounds are normal.     Palpations: Abdomen is soft.     Tenderness: There is no abdominal tenderness.  Musculoskeletal:     Cervical back: Normal range of motion and neck supple.     Right lower leg: No edema.     Left lower leg: No edema.     Comments: Hyperkeratotic skin over lower extremities  Skin:    General: Skin is warm.  Neurological:     General: No focal deficit present.     Mental Status: She is alert and oriented to person, place, and time.  Psychiatric:        Mood and Affect: Mood normal.     Assessment/Plan: 1.  Acute kidney injury on chronic kidney disease stage IV: This appears to be likely hemodynamically mediated in the setting of aggressive diuresis with ongoing ARB use.  She has had significant weight loss and feels like legs are back to "baseline".  Overnight renal function has improved with holding diuretics/ARB and with intravenous fluids.   The plan is to continue gentle intravenous volume expansion while holding diuresis and then restarting diuretics at lower dose followed by losartan.  Her urinalysis does not show any evidence of UTI or acute GN to prompt additional evaluation or intervention.  She does not have any critical electrolyte abnormality or indications for dialysis at this time. Avoid nephrotoxic medications including NSAIDs and iodinated intravenous contrast exposure unless the latter is absolutely indicated.  Preferred narcotic agents for pain control are hydromorphone, fentanyl, and methadone. Morphine should not be used. Avoid Baclofen and avoid oral sodium phosphate and magnesium citrate based laxatives / bowel preps. Continue strict Input and Output monitoring. Will monitor the patient closely with you and intervene or adjust therapy as indicated by changes in clinical status/labs. 2.  Nausea/vomiting: Suspected to be likely secondary to azotemia from recent aggressive diuresis and compounded by decreased oral intake in the setting of ongoing ARB use.  Continue symptom management and monitor with renal recovery. 3.  Hypokalemia: Secondary to limited intake/diuretic induced losses and will replace cautiously via oral route. 4.  Anemia of chronic disease: Hemoglobin and hematocrit within acceptable range, continue to follow trend to decide on need for additional evaluation with iron studies/ESA. 5.  Hypertension: Blood pressure elevated likely as a result of recent ER encounter/holding diuretics and ARB.  Resumed carvedilol and getting as needed hydralazine.  Dago Jungwirth K. 11/20/2021, 11:13 AM

## 2021-11-21 DIAGNOSIS — I2510 Atherosclerotic heart disease of native coronary artery without angina pectoris: Secondary | ICD-10-CM | POA: Diagnosis not present

## 2021-11-21 DIAGNOSIS — J449 Chronic obstructive pulmonary disease, unspecified: Secondary | ICD-10-CM | POA: Diagnosis not present

## 2021-11-21 DIAGNOSIS — E1142 Type 2 diabetes mellitus with diabetic polyneuropathy: Secondary | ICD-10-CM | POA: Diagnosis not present

## 2021-11-21 DIAGNOSIS — N179 Acute kidney failure, unspecified: Secondary | ICD-10-CM | POA: Diagnosis not present

## 2021-11-21 LAB — RENAL FUNCTION PANEL
Albumin: 2.4 g/dL — ABNORMAL LOW (ref 3.5–5.0)
Anion gap: 7 (ref 5–15)
BUN: 63 mg/dL — ABNORMAL HIGH (ref 8–23)
CO2: 25 mmol/L (ref 22–32)
Calcium: 8.3 mg/dL — ABNORMAL LOW (ref 8.9–10.3)
Chloride: 105 mmol/L (ref 98–111)
Creatinine, Ser: 3.31 mg/dL — ABNORMAL HIGH (ref 0.44–1.00)
GFR, Estimated: 15 mL/min — ABNORMAL LOW (ref >60)
Glucose, Bld: 105 mg/dL — ABNORMAL HIGH (ref 70–99)
Phosphorus: 3.7 mg/dL (ref 2.5–4.6)
Potassium: 3.3 mmol/L — ABNORMAL LOW (ref 3.5–5.1)
Sodium: 137 mmol/L (ref 135–145)

## 2021-11-21 LAB — GLUCOSE, CAPILLARY
Glucose-Capillary: 129 mg/dL — ABNORMAL HIGH (ref 70–99)
Glucose-Capillary: 172 mg/dL — ABNORMAL HIGH (ref 70–99)
Glucose-Capillary: 83 mg/dL (ref 70–99)
Glucose-Capillary: 123 mg/dL — ABNORMAL HIGH (ref 70–99)

## 2021-11-21 MED ORDER — POTASSIUM CHLORIDE CRYS ER 20 MEQ PO TBCR
40.0000 meq | EXTENDED_RELEASE_TABLET | Freq: Once | ORAL | Status: AC
Start: 2021-11-21 — End: 2021-11-21
  Administered 2021-11-21: 40 meq via ORAL
  Filled 2021-11-21: qty 2

## 2021-11-21 MED ORDER — AMLODIPINE BESYLATE 5 MG PO TABS
10.0000 mg | ORAL_TABLET | Freq: Every day | ORAL | Status: DC
  Administered 2021-11-21 – 2021-11-22 (×2): 10 mg via ORAL
  Filled 2021-11-21 (×2): qty 2

## 2021-11-21 NOTE — Assessment & Plan Note (Signed)
Per pt last a1c over 10  ? If any gastroparesis Under endocrinology care

## 2021-11-21 NOTE — Progress Notes (Signed)
Patient ID: Jennifer [name], female   DOB: May 30, 1956, 66 y.o.   MRN: [id number] [facility] Progress Note   Assessment/ Plan:   1.  Acute kidney injury on chronic kidney disease stage IV: This appears to be likely hemodynamically mediated in the setting of aggressive diuresis with ongoing ARB use.  She has had improvement of renal function overnight with gentle intravenous volume expansion and today, I will discontinue her intravenous fluids and recheck labs again tomorrow morning to assess for ongoing renal recovery.  If creatinine continues to trend down on labs tomorrow morning, would be stable to discharge home off of diuretics or on much lower dose furosemide 40 mg daily (previously taking 80 twice daily).  She has an outpatient renal follow-up on Wednesday 6/14. 2.  Nausea/vomiting: Suspected to be likely secondary to azotemia from recent aggressive diuresis and compounded by decreased oral intake in the setting of ongoing ARB use.  This has resolved overnight and her oral intake appears to be satisfactory at this time. 3.  Hypokalemia: Secondary to limited intake/diuretic induced losses and will continue oral replacement cautiously. 4.  Anemia of chronic disease: Hemoglobin and hematocrit within acceptable range, continue to follow trend to decide on need for additional evaluation with iron studies/ESA. 5.  Hypertension: Blood pressure elevated likely as a result of recent ER encounter/holding diuretics and ARB.  She is on carvedilol and today I will restart her amlodipine.  Restart low-dose losartan 12.5 mg if creatinine continues to trend down tomorrow.  Subjective:   Reports to be feeling better today   Objective:   BP (!) 173/73 (BP Location: Left Arm)   Pulse 75   Temp 99.8 F (37.7 C) (Oral)   Resp 16   Ht 5\' 7"  (1.702 m)   Wt 98.1 kg   SpO2 95%   BMI 33.87 kg/m   Intake/Output Summary (Last 24 hours) at 11/21/2021 1159 Last data filed at 11/21/2021 1158 Gross per  24 hour  Intake 2570.49 ml  Output --  Net 2570.49 ml   Weight change:   Physical Exam: Gen: Comfortably resting in bed my daughter at bedside CVS: Pulse regular rhythm, normal rate, S1 and S2 normal Resp: Clear to auscultation bilaterally, no distinct rales or rhonchi Abd: Soft, obese, nontender, bowel sounds normal Ext: No edema over lower extremities  Imaging: No results found.  Labs: BMET Recent Labs  Lab 11/19/21 1012 11/19/21 1845 11/20/21 0608 11/21/21 0358  NA 136 135 139 137  K 3.4* 3.5 3.2* 3.3*  CL 95* 99 104 105  CO2 31 29 27 25   GLUCOSE 187* 294* 187* 105*  BUN 81* 85* 76* 63*  CREATININE 4.05* 4.10* 3.75* 3.31*  CALCIUM 9.2 8.6* 8.6* 8.3*  PHOS  --   --   --  3.7   CBC Recent Labs  Lab 11/19/21 1012 11/19/21 1845 11/20/21 0608  WBC 8.4 6.9 6.6  NEUTROABS 6.4 5.0  --   HGB 11.2* 10.9* 10.2*  HCT 33.7* 33.2* 31.2*  MCV 82.3 83.8 85.0  PLT 265.0 236 222    Medications:     aspirin EC  81 mg Oral BID   atorvastatin  80 mg Oral Daily   carvedilol  12.5 mg Oral BID WC   DULoxetine  60 mg Oral Daily   ezetimibe  10 mg Oral Daily   feeding supplement  1 Container Oral TID BM   ferrous sulfate  325 mg Oral Q breakfast   insulin aspart  0-15 Units  Subcutaneous TID WC   insulin aspart  0-5 Units Subcutaneous QHS   insulin aspart  6 Units Subcutaneous TID WC   insulin glargine-yfgn  10 Units Subcutaneous BID   levothyroxine  137 mcg Oral QAC breakfast   Elmarie Shiley, MD 11/21/2021, 11:59 AM

## 2021-11-21 NOTE — Assessment & Plan Note (Signed)
With n/v Lab and abd/chest xrays ordered

## 2021-11-21 NOTE — Assessment & Plan Note (Signed)
2 weeks, no fever or diarrhea, poor appetite prior to that  Complex medical history incl DM and renal insuff Suspect some dehydration, but she has been able to keep fluids down (not food) Lab, cxr ua and abd xray ordered    Addendum: worsening renal function with cr over 4  Nl xrays and ua  Sent to ER for eval and likely admission

## 2021-11-21 NOTE — Progress Notes (Signed)
PROGRESS NOTE   Jennifer [name]  FTD:[id number] DOB: 01-22-1956 DOA: 11/19/2021 PCP: Abner Greenspan, MD   Chief Complaint  Patient presents with   Emesis   Level of care: Telemetry  Brief Admission History:  66 y.o. female, with past medical history of CAD, diabetes mellitus, hypertension, diabetic nephropathy -Patient presents to ED secondary to nausea and vomiting, her symptoms has been going on for last week with nausea and vomiting, but overall she been having poor appetite for a month or so, she does report some intermittent abdominal pain, but she denies any chest pain, shortness of breath, fever, chills, cough or sick contact, patient reports she was with significant edema for which her diuresis has been increased by her nephrologist. -ED work-up significant for glucose 294, creatinine 4.1, BUN of 85, hemoglobin stable at 10.9, [facility] hospitalist consulted to admit.   Assessment and Plan AKI on CKD stage IV -Patient with known diabetic nephropathy.  Appears with worsening renal function with diuresis. -Significantly worsening creatinine and BUN recently creatinine up from 2.3> 4.1, and BUN up from 32> 85. -To be clinically volume depleted and dehydrated, prerenal acidemia.  -appreciate nephrology team recommendations.   -pt completed IV fluid hydration.  -holding losartan temporarily -creatinine improved with gentle hydration.  - anticipate going home tomorrow, she has outpatient nephrology appt on 6/14 per Dr. Posey Pronto    Nausea and vomiting -improved to resolved now after IV fluid   COPD -No active wheezing, continue with nebs as needed.   History of CAD -Status post PCI -Continue with aspirin and Lipitor -Continue with Coreg   PAD/Carotid stenosis/CVA -Continue with aspirin and statin   Diabetes mellitus type 2, insulin-dependent Added prandial coverage with meals  CBG (last 3)  Recent Labs    11/20/21 2112 11/21/21 0748 11/21/21 1111  GLUCAP 144* 129* 172*     Hypothyroidism -Continue with Synthroid   Chronic kidney disease -creatinine at baseline    Chronic diastolic CHF  Echo from April 2020 with EF of 55 to 60% with first-degree diastolic dysfunction,  -Appears to be clinically dry, monitor on IV fluids   Tobacco abuse -Patient reports she did cut down to 1 cigarettes/day, she was congratulated about that   Daniel home with home health  DVT Prophylaxis Heparin     Code Status: full  Family Communication: daughter Disposition: Status is: Inpatient Remains inpatient appropriate because: IV fluids    Consultants:  nephrology Procedures:   Antimicrobials:    Subjective: Pt says overall she feels better than when she was admitted.     Objective: Vitals:   11/20/21 1355 11/20/21 2302 11/21/21 0817 11/21/21 1253  BP: 137/72 (!) 153/66 (!) 173/73 (!) 152/67  Pulse: 71 71 75 71  Resp: 16 16 16 20   Temp: 98.6 F (37 C) 99.8 F (37.7 C)  98.4 F (36.9 C)  TempSrc: Oral Oral  Oral  SpO2: 95% 94% 95% 98%  Weight:      Height:        Intake/Output Summary (Last 24 hours) at 11/21/2021 1430 Last data filed at 11/21/2021 1300 Gross per 24 hour  Intake 2619.15 ml  Output --  Net 2619.15 ml   Filed Weights   11/20/21 1158  Weight: 98.1 kg   Examination:  General exam: Appears calm and comfortable  Respiratory system: Clear to auscultation. Respiratory effort normal. Cardiovascular system: normal S1 & S2 heard. No JVD, murmurs, rubs, gallops or clicks. No pedal edema. Gastrointestinal system: Abdomen is nondistended, soft and  nontender. No organomegaly or masses felt. Normal bowel sounds heard. Central nervous system: Alert and oriented. No focal neurological deficits. Extremities: Symmetric 5 x 5 power. Skin: No rashes, lesions or ulcers. Psychiatry: Judgement and insight appear normal. Mood & affect appropriate.   Data Reviewed: I have personally reviewed following labs and imaging studies  CBC: Recent  Labs  Lab 11/19/21 1012 11/19/21 1845 11/20/21 0608  WBC 8.4 6.9 6.6  NEUTROABS 6.4 5.0  --   HGB 11.2* 10.9* 10.2*  HCT 33.7* 33.2* 31.2*  MCV 82.3 83.8 85.0  PLT 265.0 236 782    Basic Metabolic Panel: Recent Labs  Lab 11/19/21 1012 11/19/21 1845 11/20/21 0608 11/21/21 0358  NA 136 135 139 137  K 3.4* 3.5 3.2* 3.3*  CL 95* 99 104 105  CO2 31 29 27 25   GLUCOSE 187* 294* 187* 105*  BUN 81* 85* 76* 63*  CREATININE 4.05* 4.10* 3.75* 3.31*  CALCIUM 9.2 8.6* 8.6* 8.3*  PHOS  --   --   --  3.7    CBG: Recent Labs  Lab 11/20/21 1107 11/20/21 1554 11/20/21 2112 11/21/21 0748 11/21/21 1111  GLUCAP 269* 113* 144* 129* 172*    No results found for this or any previous visit (from the past 240 hour(s)).   Radiology Studies: No results found.  Scheduled Meds:  amLODipine  10 mg Oral Daily   aspirin EC  81 mg Oral BID   atorvastatin  80 mg Oral Daily   carvedilol  12.5 mg Oral BID WC   DULoxetine  60 mg Oral Daily   ezetimibe  10 mg Oral Daily   feeding supplement  1 Container Oral TID BM   ferrous sulfate  325 mg Oral Q breakfast   insulin aspart  0-15 Units Subcutaneous TID WC   insulin aspart  0-5 Units Subcutaneous QHS   insulin aspart  6 Units Subcutaneous TID WC   insulin glargine-yfgn  10 Units Subcutaneous BID   levothyroxine  137 mcg Oral QAC breakfast   Continuous Infusions:   LOS: 2 days   Time spent: 35 mins  Mayah Urquidi Wynetta Emery, MD How to contact the [facility] Attending or Consulting provider [place] or covering provider during after hours [place], for this patient?  Check the care team in [facility] and look for a) attending/consulting [facility] provider listed and b) the [facility] team listed Log into [email or web address] and use 's universal password to access. If you do not have the password, please contact the hospital operator. Locate the [facility] provider you are looking for under [facility] and page to a number that you can be directly reached. If you still  have difficulty reaching the provider, please page the [facility] (Director on Call) for the Hospitalists listed on amion for assistance.  11/21/2021, 2:30 PM

## 2021-11-22 ENCOUNTER — Telehealth: Payer: Self-pay

## 2021-11-22 DIAGNOSIS — E785 Hyperlipidemia, unspecified: Secondary | ICD-10-CM

## 2021-11-22 DIAGNOSIS — N179 Acute kidney failure, unspecified: Secondary | ICD-10-CM | POA: Diagnosis not present

## 2021-11-22 DIAGNOSIS — I10 Essential (primary) hypertension: Secondary | ICD-10-CM | POA: Diagnosis not present

## 2021-11-22 DIAGNOSIS — I2510 Atherosclerotic heart disease of native coronary artery without angina pectoris: Secondary | ICD-10-CM | POA: Diagnosis not present

## 2021-11-22 DIAGNOSIS — J449 Chronic obstructive pulmonary disease, unspecified: Secondary | ICD-10-CM | POA: Diagnosis not present

## 2021-11-22 DIAGNOSIS — Z794 Long term (current) use of insulin: Secondary | ICD-10-CM

## 2021-11-22 DIAGNOSIS — E1169 Type 2 diabetes mellitus with other specified complication: Secondary | ICD-10-CM

## 2021-11-22 DIAGNOSIS — E039 Hypothyroidism, unspecified: Secondary | ICD-10-CM

## 2021-11-22 LAB — RENAL FUNCTION PANEL
Albumin: 2.4 g/dL — ABNORMAL LOW (ref 3.5–5.0)
Anion gap: 3 — ABNORMAL LOW (ref 5–15)
BUN: 55 mg/dL — ABNORMAL HIGH (ref 8–23)
CO2: 27 mmol/L (ref 22–32)
Calcium: 8.5 mg/dL — ABNORMAL LOW (ref 8.9–10.3)
Chloride: 109 mmol/L (ref 98–111)
Creatinine, Ser: 3.06 mg/dL — ABNORMAL HIGH (ref 0.44–1.00)
GFR, Estimated: 16 mL/min — ABNORMAL LOW (ref >60)
Glucose, Bld: 100 mg/dL — ABNORMAL HIGH (ref 70–99)
Phosphorus: 3.7 mg/dL (ref 2.5–4.6)
Potassium: 3.7 mmol/L (ref 3.5–5.1)
Sodium: 139 mmol/L (ref 135–145)

## 2021-11-22 LAB — GLUCOSE, CAPILLARY: Glucose-Capillary: 102 mg/dL — ABNORMAL HIGH (ref 70–99)

## 2021-11-22 MED ORDER — LOSARTAN POTASSIUM 25 MG PO TABS: 12.5000 mg | ORAL_TABLET | Freq: Every day | ORAL | 1 refills | Status: DC

## 2021-11-22 MED ORDER — HUMULIN 70/30 KWIKPEN (70-30) 100 UNIT/ML ~~LOC~~ SUPN: PEN_INJECTOR | SUBCUTANEOUS | 11 refills | Status: DC

## 2021-11-22 NOTE — Progress Notes (Signed)
Patient ID: Jennifer [name], female   DOB: [birth year], 66 y.o.   MRN: [id number] S: Feeling better O:BP (!) 161/80 (BP Location: Left Wrist) Comment: notified RN  Pulse 70   Temp 98 F (36.7 C) (Oral)   Resp 18   Ht 5\' 7"  (1.702 m)   Wt 98.1 kg   SpO2 93%   BMI 33.87 kg/m   Intake/Output Summary (Last 24 hours) at 11/22/2021 0824 Last data filed at 11/22/2021 0500 Gross per 24 hour  Intake 957.89 ml  Output --  Net 957.89 ml   Intake/Output: I/O last 3 completed shifts: In: 2613.4 [P.O.:1440; I.V.:1173.4] Out: -   Intake/Output this shift:  No intake/output data recorded. Weight change:  Gen:NAD CVS: RRR Resp:CTA Abd: +BS, soft, NT/ND Ext: no edema  Recent Labs  Lab 11/19/21 1012 11/19/21 1845 11/20/21 0608 11/21/21 0358 11/22/21 0428  NA 136 135 139 137 139  K 3.4* 3.5 3.2* 3.3* 3.7  CL 95* 99 104 105 109  CO2 31 29 27 25 27   GLUCOSE 187* 294* 187* 105* 100*  BUN 81* 85* 76* 63* 55*  CREATININE 4.05* 4.10* 3.75* 3.31* 3.06*  ALBUMIN 3.3* 2.8*  --  2.4* 2.4*  CALCIUM 9.2 8.6* 8.6* 8.3* 8.5*  PHOS  --   --   --  3.7 3.7  AST 13 17  --   --   --   ALT 16 19  --   --   --    Liver Function Tests: Recent Labs  Lab 11/19/21 1012 11/19/21 1845 11/21/21 0358 11/22/21 0428  AST 13 17  --   --   ALT 16 19  --   --   ALKPHOS 79 70  --   --   BILITOT 0.4 0.4  --   --   PROT 5.8* 5.4*  --   --   ALBUMIN 3.3* 2.8* 2.4* 2.4*   Recent Labs  Lab 11/19/21 1012  LIPASE 15.0   No results for input(s): "AMMONIA" in the last 168 hours. CBC: Recent Labs  Lab 11/19/21 1012 11/19/21 1845 11/20/21 0608  WBC 8.4 6.9 6.6  NEUTROABS 6.4 5.0  --   HGB 11.2* 10.9* 10.2*  HCT 33.7* 33.2* 31.2*  MCV 82.3 83.8 85.0  PLT 265.0 236 222   Cardiac Enzymes: No results for input(s): "CKTOTAL", "CKMB", "CKMBINDEX", "TROPONINI" in the last 168 hours. CBG: Recent Labs  Lab 11/21/21 0748 11/21/21 1111 11/21/21 1645 11/21/21 2055 11/22/21 0718  GLUCAP 129* 172* 83 123*  102*    Iron Studies: No results for input(s): "IRON", "TIBC", "TRANSFERRIN", "FERRITIN" in the last 72 hours. Studies/Results: No results found.  amLODipine  10 mg Oral Daily   aspirin EC  81 mg Oral BID   atorvastatin  80 mg Oral Daily   carvedilol  12.5 mg Oral BID WC   DULoxetine  60 mg Oral Daily   ezetimibe  10 mg Oral Daily   feeding supplement  1 Container Oral TID BM   ferrous sulfate  325 mg Oral Q breakfast   insulin aspart  0-15 Units Subcutaneous TID WC   insulin aspart  0-5 Units Subcutaneous QHS   insulin aspart  6 Units Subcutaneous TID WC   insulin glargine-yfgn  10 Units Subcutaneous BID   levothyroxine  137 mcg Oral QAC breakfast    BMET    Component Value Date/Time   NA 139 11/22/2021 0428   K 3.7 11/22/2021 0428   CL 109 11/22/2021 0428  CO2 27 11/22/2021 0428   GLUCOSE 100 (H) 11/22/2021 0428   BUN 55 (H) 11/22/2021 0428   CREATININE 3.06 (H) 11/22/2021 0428   CALCIUM 8.5 (L) 11/22/2021 0428   GFRNONAA 16 (L) 11/22/2021 0428   GFRAA 43 (L) 02/05/2020 2019   CBC    Component Value Date/Time   WBC 6.6 11/20/2021 0608   RBC 3.67 (L) 11/20/2021 0608   HGB 10.2 (L) 11/20/2021 0608   HGB 11.6 05/20/2020 1453   HCT 31.2 (L) 11/20/2021 0608   HCT 35.8 05/20/2020 1453   PLT 222 11/20/2021 0608   PLT 276 05/20/2020 1453   MCV 85.0 11/20/2021 0608   MCV 84 05/20/2020 1453   MCH 27.8 11/20/2021 0608   MCHC 32.7 11/20/2021 0608   RDW 13.9 11/20/2021 0608   RDW 13.5 05/20/2020 1453   LYMPHSABS 1.4 11/19/2021 1845   LYMPHSABS 1.7 05/20/2020 1453   MONOABS 0.4 11/19/2021 1845   EOSABS 0.1 11/19/2021 1845   EOSABS 0.1 05/20/2020 1453   BASOSABS 0.0 11/19/2021 1845   BASOSABS 0.1 05/20/2020 1453     Assessment/Plan: 1.  Acute kidney injury on chronic kidney disease stage IV: This appears to be likely hemodynamically mediated in the setting of aggressive diuresis with ongoing ARB use.  She has had improvement of renal function overnight with gentle  intravenous volume expansion.  BUN/Cr have continued to improve off of IVF's.  She is stable for discharge to home today.  Continue to hold off on diuretics until she is seen by Dr. Theador Hawthorne on 11/24/21.  Nothing further to add.  Will sign off.  Please call with questions or concerns. 2.  Nausea/vomiting: Suspected to be likely secondary to azotemia from recent aggressive diuresis and compounded by decreased oral intake in the setting of ongoing ARB use.  This has resolved overnight and her oral intake appears to be satisfactory at this time. 3.  Hypokalemia: Secondary to limited intake/diuretic induced losses and will continue oral replacement cautiously. 4.  Anemia of chronic disease: Hemoglobin and hematocrit within acceptable range, continue to follow trend to decide on need for additional evaluation with iron studies/ESA. 5.  Hypertension: Blood pressure elevated likely as a result of recent ER encounter/holding diuretics and ARB.  Restart low-dose losartan 12.5 mg if creatinine continues to trend down tomorrow.  Follow up with her primary Nephrologist on 11/24/21.  Donetta Potts, MD [facility]

## 2021-11-22 NOTE — Telephone Encounter (Signed)
I received a prior auth for Promethazine Hcl 25 MG tablets.  I do not see where Jennifer [name] has this prescription current in our system.  Should I start this prior auth or disregard it?

## 2021-11-22 NOTE — TOC Progression Note (Signed)
Transition of Care [facility]) - Progression Note    Patient Details  Name: Jennifer [name] MRN: [id number] Date of Birth: 1955/10/03  Transition of Care [facility]) CM/SW Contact  Salome Arnt, [place] Phone Number: 11/22/2021, 10:07 AM  Clinical Narrative:  Transition of Care [facility]) Screening Note   Patient Details  Name: Jennifer [name] Date of Birth: Apr 24, 1956   Transition of Care [facility]) CM/SW Contact:    Salome Arnt, [place] Phone Number: 11/22/2021, 10:07 AM    Transition of Care Department [facility]) has reviewed patient and no TOC needs have been identified at this time. We will continue to monitor patient advancement through interdisciplinary progression rounds. If new patient transition needs arise, please place a TOC consult.             Expected Discharge Plan and Services           Expected Discharge Date: 11/22/21                                     Social Determinants of Health (SDOH) Interventions    Readmission Risk Interventions     No data to display

## 2021-11-22 NOTE — Progress Notes (Signed)
Nsg Discharge Note  Admit Date:  11/19/2021 Discharge date: 11/22/2021   Jennifer [name] to be D/C'd Home per MD order.  AVS completed.   Patient/caregiver able to verbalize understanding.  Discharge Medication: Allergies as of 11/22/2021       Reactions   Metformin Hcl Other (See Comments)        Medication List     STOP taking these medications    ezetimibe 10 MG tablet Commonly known as: ZETIA   furosemide 40 MG tablet Commonly known as: LASIX   metolazone 5 MG tablet Commonly known as: ZAROXOLYN   POTASSIUM CHLORIDE PO   promethazine 25 MG tablet Commonly known as: PHENERGAN       TAKE these medications    amLODipine 10 MG tablet Commonly known as: NORVASC Take 1 tablet (10 mg total) by mouth daily.   aspirin EC 81 MG tablet Take 1 tablet (81 mg total) by mouth 2 (two) times daily. Hold aspirin while you are on apixaban/Eliquis   atorvastatin 80 MG tablet Commonly known as: LIPITOR TAKE 1 TABLET BY MOUTH ONCE DAILY 6 IN THE EVENING   BD Pen Needle Nano 2nd Gen 32G X 4 MM Misc Generic drug: Insulin Pen Needle Inject into the skin 3 (three) times daily.   buPROPion 150 MG 12 hr tablet Commonly known as: WELLBUTRIN SR Take 1 tablet (150 mg total) by mouth 2 (two) times daily.   carvedilol 12.5 MG tablet Commonly known as: COREG Take 1 tablet (12.5 mg total) by mouth 2 (two) times daily with a meal.   DULoxetine 60 MG capsule Commonly known as: CYMBALTA Take 1 capsule (60 mg total) by mouth daily.   ferrous sulfate 325 (65 FE) MG tablet Take 325 mg by mouth daily with breakfast.   HumuLIN 70/30 KwikPen (70-30) 100 UNIT/ML KwikPen Generic drug: insulin isophane & regular human KwikPen Inject 15 units in the morning and if she eats 10 units at lunch and 15 units with supper meal What changed:  how much to take how to take this when to take this additional instructions   levothyroxine 137 MCG tablet Commonly known as: SYNTHROID Take 150 mcg by  mouth daily before breakfast.   losartan 25 MG tablet Commonly known as: COZAAR Take 0.5 tablets (12.5 mg total) by mouth daily. Start taking on: November 23, 2021   nitroGLYCERIN 0.4 MG SL tablet Commonly known as: NITROSTAT Place 1 tablet (0.4 mg total) under the tongue every 5 (five) minutes x 3 doses as needed for chest pain.        Discharge Assessment: Vitals:   11/21/21 2053 11/22/21 0426  BP: (!) 154/58 (!) 161/80  Pulse: 73 70  Resp: 18 18  Temp: 99 F (37.2 C) 98 F (36.7 C)  SpO2: 93% 93%   Skin clean, dry and intact without evidence of skin break down, no evidence of skin tears noted. IV catheter discontinued intact. Site without signs and symptoms of complications - no redness or edema noted at insertion site, patient denies c/o pain - only slight tenderness at site.  Dressing with slight pressure applied.  D/c Instructions-Education: Discharge instructions given to patient/family with verbalized understanding. D/c education completed with patient/family including follow up instructions, medication list, d/c activities limitations if indicated, with other d/c instructions as indicated by MD - patient able to verbalize understanding, all questions fully answered. Patient instructed to return to ED, call 911, or call MD for any changes in condition.  Patient escorted via [place],  and D/C home via private auto.  Kathie Rhodes, RN 11/22/2021 10:17 AM

## 2021-11-22 NOTE — Discharge Summary (Signed)
Physician Discharge Summary  AZRIELLE SPRINGSTEEN PZW:[id number] DOB: Jul 10, 1955 DOA: 11/19/2021  PCP: Abner Greenspan, MD Nephrologist: Dr. Theador Hawthorne  Admit date: 11/19/2021 Discharge date: 11/22/2021  Admitted From:  Home  Disposition: Home   Recommendations for Outpatient Follow-up:  Follow up with nephrologist on 11/24/21 as scheduled  Please hold diuretics until follow up with Dr. Theador Hawthorne.   Discharge Condition: Stable   CODE STATUS: Full Diet: renal/carb modified    Brief Hospitalization Summary: Please see all hospital notes, images, labs for full details of the hospitalization. 66 y.o. female, with past medical history of CAD, diabetes mellitus, hypertension, diabetic nephropathy, -Patient presents to ED secondary to nausea and vomiting, her symptoms has been going on for last week with nausea and vomiting, but overall she been having poor appetite for a month or so, she does report some intermittent abdominal pain, but she denies any chest pain, shortness of breath, fever, chills, cough or sick contact, patient reports she was with significant edema for which her diuresis has been increased by her nephrologist. -ED work-up significant for glucose 294, creatinine 4.1, BUN of 85, hemoglobin stable at 10.9, [facility] hospitalist consulted to admit.  Hospital Course by Problem   AKI on CKD stage IV -Patient with known diabetic nephropathy.  Appears with worsening renal function with diuresis. -Significantly worsening creatinine and BUN recently creatinine up from 2.3> 4.1, and BUN up from 32> 85. -pt presented as clinically volume depleted and dehydrated, prerenal acidemia.  -appreciate nephrology team recommendations.   -pt completed IV fluid hydration.  -holding losartan temporarily but can restart losartan 12.5 mg daily on 11/23/21 -creatinine improved with gentle hydration.  - pt can discharge home she has outpatient nephrology appt on 6/14  - hold diuretics until she can follow up with her  nephrologist Dr. Theador Hawthorne on 6/14     Nausea and vomiting -resolved now after IV fluid and supportive measures   COPD -No active wheezing, continue with nebs as needed.   History of CAD -Status post PCI -Continue with aspirin and Lipitor -Continue with Coreg   PAD/Carotid stenosis/CVA -Continue with aspirin and statin   Diabetes mellitus type 2, insulin-dependent Added prandial coverage with meals  CBG (last 3)  Recent Labs    11/21/21 1645 11/21/21 2055 11/22/21 0718  GLUCAP 83 123* 102*   Resume home treatment at discharge but reduced insulin doses due to worsening CKD    Hypothyroidism -Continue with Synthroid   Chronic kidney disease -creatinine at baseline    Chronic diastolic CHF  Echo from April 2020 with EF of 55 to 60% with first-degree diastolic dysfunction,  -Appears to be clinically dry, monitor on IV fluids   Tobacco abuse -Patient reports she did cut down to 1 cigarettes/day, she was congratulated about that   [place] home with home health  DVT Prophylaxis Heparin   Discharge Diagnoses:  Principal Problem:   AKI (acute kidney injury) ([place]) Active Problems:   Diabetic peripheral neuropathy ([place])   Hyperlipidemia associated with type 2 diabetes mellitus ([place])   Essential hypertension   COPD (chronic obstructive pulmonary disease) ([place])   Hypothyroidism   CAD S/P percutaneous coronary angioplasty   Morbid obesity ([place])   History of CVA (cerebrovascular accident)   Type 2 diabetes mellitus ([place])   Diabetic nephropathy ([place])  Discharge Instructions:  Allergies as of 11/22/2021       Reactions   Metformin Hcl Other (See Comments)        Medication List     STOP  taking these medications    ezetimibe 10 MG tablet Commonly known as: ZETIA   furosemide 40 MG tablet Commonly known as: LASIX   metolazone 5 MG tablet Commonly known as: ZAROXOLYN   POTASSIUM CHLORIDE PO   promethazine 25 MG tablet Commonly known as:  PHENERGAN       TAKE these medications    amLODipine 10 MG tablet Commonly known as: NORVASC Take 1 tablet (10 mg total) by mouth daily.   aspirin EC 81 MG tablet Take 1 tablet (81 mg total) by mouth 2 (two) times daily. Hold aspirin while you are on apixaban/Eliquis   atorvastatin 80 MG tablet Commonly known as: LIPITOR TAKE 1 TABLET BY MOUTH ONCE DAILY 6 IN THE EVENING   BD Pen Needle Nano 2nd Gen 32G X 4 MM Misc Generic drug: Insulin Pen Needle Inject into the skin 3 (three) times daily.   buPROPion 150 MG 12 hr tablet Commonly known as: WELLBUTRIN SR Take 1 tablet (150 mg total) by mouth 2 (two) times daily.   carvedilol 12.5 MG tablet Commonly known as: COREG Take 1 tablet (12.5 mg total) by mouth 2 (two) times daily with a meal.   DULoxetine 60 MG capsule Commonly known as: CYMBALTA Take 1 capsule (60 mg total) by mouth daily.   ferrous sulfate 325 (65 FE) MG tablet Take 325 mg by mouth daily with breakfast.   HumuLIN 70/30 KwikPen (70-30) 100 UNIT/ML KwikPen Generic drug: insulin isophane & regular human KwikPen Inject 15 units in the morning and if she eats 10 units at lunch and 15 units with supper meal What changed:  how much to take how to take this when to take this additional instructions   levothyroxine 137 MCG tablet Commonly known as: SYNTHROID Take 150 mcg by mouth daily before breakfast.   losartan 25 MG tablet Commonly known as: COZAAR Take 0.5 tablets (12.5 mg total) by mouth daily. Start taking on: November 23, 2021   nitroGLYCERIN 0.4 MG SL tablet Commonly known as: NITROSTAT Place 1 tablet (0.4 mg total) under the tongue every 5 (five) minutes x 3 doses as needed for chest pain.        Follow-up Information     [name], [name], MD. Schedule an appointment as soon as possible for a visit in 1 week(s).   Specialties: Family Medicine, Radiology Why: Hospital Follow Up Contact information: [place]  [id number] [phone]         Liana Gerold, MD. Go on 11/24/2021.   Specialty: Nephrology Why: Hospital Follow Up Contact information: [address] [address] [phone]                Allergies  Allergen Reactions   Metformin Hcl Other (See Comments)   Allergies as of 11/22/2021       Reactions   Metformin Hcl Other (See Comments)        Medication List     STOP taking these medications    ezetimibe 10 MG tablet Commonly known as: ZETIA   furosemide 40 MG tablet Commonly known as: LASIX   metolazone 5 MG tablet Commonly known as: ZAROXOLYN   POTASSIUM CHLORIDE PO   promethazine 25 MG tablet Commonly known as: PHENERGAN       TAKE these medications    amLODipine 10 MG tablet Commonly known as: NORVASC Take 1 tablet (10 mg total) by mouth daily.   aspirin EC 81 MG tablet Take 1 tablet (  81 mg total) by mouth 2 (two) times daily. Hold aspirin while you are on apixaban/Eliquis   atorvastatin 80 MG tablet Commonly known as: LIPITOR TAKE 1 TABLET BY MOUTH ONCE DAILY 6 IN THE EVENING   BD Pen Needle Nano 2nd Gen 32G X 4 MM Misc Generic drug: Insulin Pen Needle Inject into the skin 3 (three) times daily.   buPROPion 150 MG 12 hr tablet Commonly known as: WELLBUTRIN SR Take 1 tablet (150 mg total) by mouth 2 (two) times daily.   carvedilol 12.5 MG tablet Commonly known as: COREG Take 1 tablet (12.5 mg total) by mouth 2 (two) times daily with a meal.   DULoxetine 60 MG capsule Commonly known as: CYMBALTA Take 1 capsule (60 mg total) by mouth daily.   ferrous sulfate 325 (65 FE) MG tablet Take 325 mg by mouth daily with breakfast.   HumuLIN 70/30 KwikPen (70-30) 100 UNIT/ML KwikPen Generic drug: insulin isophane & regular human KwikPen Inject 15 units in the morning and if she eats 10 units at lunch and 15 units with supper meal What changed:  how much to take how to take this when to take this additional  instructions   levothyroxine 137 MCG tablet Commonly known as: SYNTHROID Take 150 mcg by mouth daily before breakfast.   losartan 25 MG tablet Commonly known as: COZAAR Take 0.5 tablets (12.5 mg total) by mouth daily. Start taking on: November 23, 2021   nitroGLYCERIN 0.4 MG SL tablet Commonly known as: NITROSTAT Place 1 tablet (0.4 mg total) under the tongue every 5 (five) minutes x 3 doses as needed for chest pain.        Procedures/Studies: DG Chest 2 View  Result Date: 11/19/2021 CLINICAL DATA:  Epigastric pain nausea and a smoker EXAM: CHEST - 2 VIEW COMPARISON:  Radiograph 04/04/2019 FINDINGS: The cardiomediastinal silhouette is within normal limits in size. Aortic arch calcifications. Pulmonary hyperinflation. No focal airspace disease. No pleural effusion. No pneumothorax. There is no acute osseous abnormality. Thoracic spondylosis. IMPRESSION: No evidence of acute cardiopulmonary disease. Electronically Signed   By: Maurine Simmering M.D.   On: 11/19/2021 11:08   DG Abd 2 Views  Result Date: 11/19/2021 CLINICAL DATA:  epigastric pain with nausea and vomiting EXAM: ABDOMEN - 2 VIEW COMPARISON:  None Available. FINDINGS: No evidence of bowel obstruction. Mild-to-moderate stool burden. Right upper quadrant surgical clips. Degenerative changes of the spine. No acute osseous abnormality. No evidence of free intraperitoneal gas. IMPRESSION: No evidence of bowel obstruction. Electronically Signed   By: Maurine Simmering M.D.   On: 11/19/2021 11:07     Subjective: Pt reports that she is feeling a lot better, no nausea or emesis, no diarrhea, tolerating diet, has appt with Dr. Theador Hawthorne tomorrow.   Discharge Exam: Vitals:   11/21/21 2053 11/22/21 0426  BP: (!) 154/58 (!) 161/80  Pulse: 73 70  Resp: 18 18  Temp: 99 F (37.2 C) 98 F (36.7 C)  SpO2: 93% 93%   Vitals:   11/21/21 0817 11/21/21 1253 11/21/21 2053 11/22/21 0426  BP: (!) 173/73 (!) 152/67 (!) 154/58 (!) 161/80  Pulse: 75 71 73 70   Resp: 16 20 18 18   Temp:  98.4 F (36.9 C) 99 F (37.2 C) 98 F (36.7 C)  TempSrc:  Oral Oral Oral  SpO2: 95% 98% 93% 93%  Weight:      Height:       General: Pt is alert, awake, not in acute distress Cardiovascular: normal S1/S2 +,  no rubs, no gallops Respiratory: CTA bilaterally, no wheezing, no rhonchi Abdominal: Soft, NT, ND, bowel sounds + Extremities: no edema, no cyanosis   The results of significant diagnostics from this hospitalization (including imaging, microbiology, ancillary and laboratory) are listed below for reference.     Microbiology: No results found for this or any previous visit (from the past 240 hour(s)).   Labs: BNP (last 3 results) No results for input(s): "BNP" in the last 8760 hours. Basic Metabolic Panel: Recent Labs  Lab 11/19/21 1012 11/19/21 1845 11/20/21 0608 11/21/21 0358 11/22/21 0428  NA 136 135 139 137 139  K 3.4* 3.5 3.2* 3.3* 3.7  CL 95* 99 104 105 109  CO2 31 29 27 25 27   GLUCOSE 187* 294* 187* 105* 100*  BUN 81* 85* 76* 63* 55*  CREATININE 4.05* 4.10* 3.75* 3.31* 3.06*  CALCIUM 9.2 8.6* 8.6* 8.3* 8.5*  PHOS  --   --   --  3.7 3.7   Liver Function Tests: Recent Labs  Lab 11/19/21 1012 11/19/21 1845 11/21/21 0358 11/22/21 0428  AST 13 17  --   --   ALT 16 19  --   --   ALKPHOS 79 70  --   --   BILITOT 0.4 0.4  --   --   PROT 5.8* 5.4*  --   --   ALBUMIN 3.3* 2.8* 2.4* 2.4*   Recent Labs  Lab 11/19/21 1012  LIPASE 15.0   No results for input(s): "AMMONIA" in the last 168 hours. CBC: Recent Labs  Lab 11/19/21 1012 11/19/21 1845 11/20/21 0608  WBC 8.4 6.9 6.6  NEUTROABS 6.4 5.0  --   HGB 11.2* 10.9* 10.2*  HCT 33.7* 33.2* 31.2*  MCV 82.3 83.8 85.0  PLT 265.0 236 222   Cardiac Enzymes: No results for input(s): "CKTOTAL", "CKMB", "CKMBINDEX", "TROPONINI" in the last 168 hours. BNP: Invalid input(s): "POCBNP" CBG: Recent Labs  Lab 11/21/21 0748 11/21/21 1111 11/21/21 1645 11/21/21 2055  11/22/21 0718  GLUCAP 129* 172* 83 123* 102*   D-Dimer No results for input(s): "DDIMER" in the last 72 hours. Hgb A1c Recent Labs    11/19/21 1845  HGBA1C 8.6*   Lipid Profile No results for input(s): "CHOL", "HDL", "LDLCALC", "TRIG", "CHOLHDL", "LDLDIRECT" in the last 72 hours. Thyroid function studies No results for input(s): "TSH", "T4TOTAL", "T3FREE", "THYROIDAB" in the last 72 hours.  Invalid input(s): "FREET3" Anemia work up No results for input(s): "VITAMINB12", "FOLATE", "FERRITIN", "TIBC", "IRON", "RETICCTPCT" in the last 72 hours. Urinalysis    Component Value Date/Time   COLORURINE STRAW (A) 11/19/2021 1845   APPEARANCEUR CLEAR 11/19/2021 1845   LABSPEC 1.010 11/19/2021 1845   PHURINE 7.0 11/19/2021 1845   GLUCOSEU 150 (A) 11/19/2021 1845   HGBUR NEGATIVE 11/19/2021 1845   BILIRUBINUR NEGATIVE 11/19/2021 1845   BILIRUBINUR neg 11/19/2021 1015   KETONESUR NEGATIVE 11/19/2021 1845   PROTEINUR >=300 (A) 11/19/2021 1845   UROBILINOGEN 0.2 11/19/2021 1015   NITRITE NEGATIVE 11/19/2021 1845   LEUKOCYTESUR NEGATIVE 11/19/2021 1845   Sepsis Labs Recent Labs  Lab 11/19/21 1012 11/19/21 1845 11/20/21 0608  WBC 8.4 6.9 6.6   Microbiology No results found for this or any previous visit (from the past 240 hour(s)).  Time coordinating discharge: 35 mins  SIGNED:  Irwin Brakeman, MD  [facility] 11/22/2021, 10:17 AM How to contact the [facility] Attending or Consulting provider [place] or covering provider during after hours [place], for this patient?  Check the care  team in [facility] and look for a) attending/consulting [place] provider listed and b) the [facility] team listed Log into [email or web address] and use [place]'s universal password to access. If you do not have the password, please contact the hospital operator. Locate the [facility] provider you are looking for under [facility] and page to a number that you can be directly reached. If you still have difficulty  reaching the provider, please page the [facility] (Director on Call) for the Hospitalists listed on amion for assistance.

## 2021-11-22 NOTE — Discharge Instructions (Signed)
IMPORTANT INFORMATION: PAY CLOSE ATTENTION   PHYSICIAN DISCHARGE INSTRUCTIONS  Follow with Primary care provider  [name], [name], MD  and other consultants as instructed by your Hospitalist Physician  [place] IF SYMPTOMS COME BACK, WORSEN OR NEW PROBLEM DEVELOPS   Please note: You were cared for by a hospitalist during your hospital stay. Every effort will be made to forward records to your primary care provider.  You can request that your primary care provider send for your hospital records if they have not received them.  Once you are discharged, your primary care physician will handle any further medical issues. Please note that NO REFILLS for any discharge medications will be authorized once you are discharged, as it is imperative that you return to your primary care physician (or establish a relationship with a primary care physician if you do not have one) for your post hospital discharge needs so that they can reassess your need for medications and monitor your lab values.  Please get a complete blood count and chemistry panel checked by your Primary MD at your next visit, and again as instructed by your Primary MD.  Get Medicines reviewed and adjusted: Please take all your medications with you for your next visit with your Primary MD  Laboratory/radiological data: Please request your Primary MD to go over all hospital tests and procedure/radiological results at the follow up, please ask your primary care provider to get all Hospital records sent to his/her office.  In some cases, they will be blood work, cultures and biopsy results pending at the time of your discharge. Please request that your primary care provider follow up on these results.  If you are diabetic, please bring your blood sugar readings with you to your follow up appointment with primary care.    Please call and make your follow up appointments as soon as possible.    Also Note  the following: If you experience worsening of your admission symptoms, develop shortness of breath, life threatening emergency, suicidal or homicidal thoughts you must seek medical attention immediately by calling 911 or calling your MD immediately  if symptoms less severe.  You must read complete instructions/literature along with all the possible adverse reactions/side effects for all the Medicines you take and that have been prescribed to you. Take any new Medicines after you have completely understood and accpet all the possible adverse reactions/side effects.   Do not drive when taking Pain medications or sleeping medications (Benzodiazepines)  Do not take more than prescribed Pain, Sleep and Anxiety Medications. It is not advisable to combine anxiety,sleep and pain medications without talking with your primary care practitioner  Special Instructions: If you have smoked or chewed Tobacco  in the last 2 yrs please stop smoking, stop any regular Alcohol  and or any Recreational drug use.  Wear Seat belts while driving.  Do not drive if taking any narcotic, mind altering or controlled substances or recreational drugs or alcohol.

## 2021-11-23 ENCOUNTER — Telehealth: Payer: Self-pay

## 2021-11-23 NOTE — Telephone Encounter (Signed)
Please cancel She ended up in the hospital and no longer needs it  Please remove from med list if it is still on

## 2021-11-23 NOTE — Telephone Encounter (Signed)
Transition Care Management Follow-up Telephone Call Date of discharge and from where: [facility] 11-22-21 Dx: AKI How have you been since you were released from the hospital? Doing ok  Any questions or concerns? No  Items Reviewed: Did the pt receive and understand the discharge instructions provided? Yes  Medications obtained and verified? Yes  Other? No  Any new allergies since your discharge? No  Dietary orders reviewed? Yes Do you have support at home? Yes   Home Care and Equipment/Supplies: Were home health services ordered? no If so, what is the name of the agency? na  Has the agency set up a time to come to the patient's home? not applicable Were any new equipment or medical supplies ordered?  No What is the name of the medical supply agency? na Were you able to get the supplies/equipment? not applicable Do you have any questions related to the use of the equipment or supplies? No  Functional Questionnaire: (I = Independent and D = Dependent) ADLs: I  Bathing/Dressing- I  Meal Prep- I  Eating- I  Maintaining continence- I  Transferring/Ambulation- I  Managing Meds- D  Follow up appointments reviewed:  PCP Hospital f/u appt confirmed? Yes  Scheduled to see Dr Glori Bickers [facility] Yes  Scheduled to see [place] on 11-24-21 @ 4pm. Are transportation arrangements needed? No  If their condition worsens, is the pt aware to call PCP or go to the Emergency Dept.? Yes Was the patient provided with contact information for the PCP's office or ED? Yes Was to pt encouraged to call back with questions or concerns? Yes

## 2021-12-01 ENCOUNTER — Ambulatory Visit (INDEPENDENT_AMBULATORY_CARE_PROVIDER_SITE_OTHER): Payer: Medicare Other | Admitting: Family Medicine

## 2021-12-01 ENCOUNTER — Encounter: Payer: Self-pay | Admitting: Family Medicine

## 2021-12-01 VITALS — BP 122/54 | HR 71 | Ht 67.0 in | Wt 230.0 lb

## 2021-12-01 DIAGNOSIS — D649 Anemia, unspecified: Secondary | ICD-10-CM

## 2021-12-01 DIAGNOSIS — I10 Essential (primary) hypertension: Secondary | ICD-10-CM | POA: Diagnosis not present

## 2021-12-01 DIAGNOSIS — N184 Chronic kidney disease, stage 4 (severe): Secondary | ICD-10-CM

## 2021-12-01 DIAGNOSIS — R112 Nausea with vomiting, unspecified: Secondary | ICD-10-CM

## 2021-12-01 DIAGNOSIS — R600 Localized edema: Secondary | ICD-10-CM

## 2021-12-01 DIAGNOSIS — N179 Acute kidney failure, unspecified: Secondary | ICD-10-CM

## 2021-12-01 DIAGNOSIS — E1142 Type 2 diabetes mellitus with diabetic polyneuropathy: Secondary | ICD-10-CM

## 2021-12-01 DIAGNOSIS — Z794 Long term (current) use of insulin: Secondary | ICD-10-CM

## 2021-12-01 DIAGNOSIS — E1121 Type 2 diabetes mellitus with diabetic nephropathy: Secondary | ICD-10-CM

## 2021-12-01 NOTE — Assessment & Plan Note (Signed)
Recent hosp with nephrotic syndrome  Reviewed hospital records, lab results and studies in detail  Now much improved  Has f/u with nephrology (next appt is 3 wk) On lower dose of losartan and now back on lasix for edema  Her GFR is back to 18 last week

## 2021-12-01 NOTE — Progress Notes (Signed)
Subjective:    Patient ID: Jennifer [name], female    DOB: [birth year], 66 y.o.   MRN: [id number]  HPI Pt presents for f/u of hospitalization for n/v and renal failure   Wt Readings from Last 3 Encounters:  12/01/21 230 lb (104.3 kg)  11/20/21 216 lb 4.3 oz (98.1 kg)  11/19/21 212 lb 9.6 oz (96.4 kg)   36.02 kg/m    Hosp from 6/9 to 6/12 at [facility] with n/v and dehydration  Cr went up to 4.1 with BUN of 85and Hb 10.9  Hospital course from d/c summary  Hospital Course by Problem    AKI on CKD stage IV -Patient with known diabetic nephropathy.  Appears with worsening renal function with diuresis. -Significantly worsening creatinine and BUN recently creatinine up from 2.3> 4.1, and BUN up from 32> 85. -pt presented as clinically volume depleted and dehydrated, prerenal acidemia.  -appreciate nephrology team recommendations.   -pt completed IV fluid hydration.  -holding losartan temporarily but can restart losartan 12.5 mg daily on 11/23/21 -creatinine improved with gentle hydration.  - pt can discharge home she has outpatient nephrology appt on 6/14  - hold diuretics until she can follow up with her nephrologist Dr. Theador Hawthorne on 6/14    Lab Results  Component Value Date   CREATININE 3.06 (H) 11/22/2021   BUN 55 (H) 11/22/2021   NA 139 11/22/2021   K 3.7 11/22/2021   CL 109 11/22/2021   CO2 27 11/22/2021  GFR 16  Ca 8.5 Albumin 2.4     Nausea and vomiting -resolved now after IV fluid and supportive measures   COPD -No active wheezing, continue with nebs as needed.   History of CAD -Status post PCI -Continue with aspirin and Lipitor -Continue with Coreg   PAD/Carotid stenosis/CVA -Continue with aspirin and statin   Diabetes mellitus type 2, insulin-dependent Added prandial coverage with meals  CBG (last 3)  Recent Labs (last 2 labs)        Recent Labs    11/21/21 1645 11/21/21 2055 11/22/21 0718  GLUCAP 83 123* 102*      Resume home  treatment at discharge but reduced insulin doses due to worsening CKD Sees endocrinology Dr Chalmers Cater    Hypothyroidism -Continue with Synthroid   Chronic kidney disease -creatinine at baseline    Chronic diastolic CHF  Echo from April 2020 with EF of 55 to 60% with first-degree diastolic dysfunction,  -Appears to be clinically dry, monitor on IV fluids   Tobacco abuse -Patient reports she did cut down to 1 cigarettes/day, she was congratulated about that   [place] home with home health  DVT Prophylaxis Heparin   HTN bp is stable today  No cp or palpitations or headaches or edema  No side effects to medicines  BP Readings from Last 3 Encounters:  12/01/21 (!) 122/54  11/22/21 (!) 161/80  11/19/21 (!) 112/56     Pulse Readings from Last 3 Encounters:  12/01/21 71  11/22/21 70  11/19/21 87   Coreg 12.5 mg bid  Amlodipine 10 mg daily  Lasix 40 mg daily  Is back on losartan 12.5 mg daily   Kcl ER 20 meq  bid   Planning to start daily weights     Lab Results  Component Value Date   WBC 6.6 11/20/2021   HGB 10.2 (L) 11/20/2021   HCT 31.2 (L) 11/20/2021   MCV 85.0 11/20/2021   PLT 222 11/20/2021   Lab Results  Component Value Date   ALT 19 11/19/2021   AST 17 11/19/2021   ALKPHOS 70 11/19/2021   BILITOT 0.4 11/19/2021   Saw nephrology at [facility] last week  Holding arb  Monitoring bp Started back lasix 40 mg daily fore edema and wt gain (bid prn) 6/14 GFR 18 with Cr of 2.86 Follow up in a month Nephrotic syndrome protein in urine due to DM2    Overall is tired  Gagging a bit but overall no n/v at all  Is eating better/avoiding sugar and starches   Yesterday glucose was 220 before eating  Has f/u with Dr Chalmers Cater soon   Is drinking water  Not short of breath   Patient Active Problem List   Diagnosis Date Noted   Nausea & vomiting 11/19/2021   AKI (acute kidney injury) ([place]) 11/19/2021   Frequent urination 07/13/2021   Change in  mental status 07/13/2021   Headache 07/13/2021   Gait abnormality 07/13/2021   Memory loss 07/13/2021   Abnormal urinalysis 07/13/2021   CKD (chronic kidney disease) stage 4, GFR 15-29 ml/min ([place]) 07/13/2021   Normocytic anemia 07/13/2021   Diabetic nephropathy ([place]) 05/28/2020   Diabetic retinopathy ([place]) 05/28/2020   Presence of insulin pump (external) (internal) 05/28/2020   Diabetic macular edema of right eye with proliferative retinopathy associated with type 2 diabetes mellitus ([place]) 04/16/2020   Macular pucker, right eye 04/16/2020   Vitreous hemorrhage of left eye ([place]) 04/16/2020   Pre-op examination 03/13/2020   Chronic venous insufficiency 02/06/2020   Constipation 05/28/2019   S/P TKR (total knee replacement), right 03/23/2019 05/07/2019   Primary osteoarthritis of right knee 04/02/2019   Unilateral primary osteoarthritis, right knee    Pedal edema 02/25/2019   Acute vertigo with vomiting and inability to stand 10/08/2018   Type 2 diabetes mellitus ([place]) 10/08/2018   Former smoker 10/08/2018   Carotid artery disease ([place]) 03/15/2018   History of CVA (cerebrovascular accident) 11/16/2017   Morbid obesity ([place]) 11/09/2017   Low back pain 10/26/2017   Screening mammogram, encounter for 09/18/2017   CAD S/P percutaneous coronary angioplasty 11/04/2015   NSTEMI (non-ST elevated myocardial infarction) ([place]) 11/03/2015   Hypothyroidism 07/06/2010   Hyperlipidemia associated with type 2 diabetes mellitus ([place]) 08/28/2008   Essential hypertension 11/13/2007   Proliferative diabetic retinopathy of left eye with macular edema associated with type 2 diabetes mellitus ([place]) 09/21/2006   Diabetic peripheral neuropathy ([place]) 09/21/2006   Adjustment disorder with mixed anxiety and depressed mood 09/21/2006   CARPAL TUNNEL SYNDROME 09/21/2006   COPD (chronic obstructive pulmonary disease) ([place]) 09/21/2006   EDEMA 09/21/2006   MIGRAINES, HX OF 09/21/2006   Past Medical History:   Diagnosis Date   CAD (coronary artery disease)    cath 5/23 100% dist RCA lesion treated with 2 overlapping Integrity Resolute DES ( 2.25x6mm, 2.25x35mm), 60% mid RCA treated medically, 99% OM2 not amenable to PCI, 70% D1 lesion, EF normal   Hypercholesteremia    Hypertension    Hypothyroidism    Stroke ([place]) 10/2018   Thyroid disease    Type 2 diabetes mellitus ([place]) 10/08/2018   Past Surgical History:  Procedure Laterality Date   ABDOMINAL HYSTERECTOMY     CARDIAC CATHETERIZATION N/A 11/03/2015   Procedure: Left Heart Cath and Coronary Angiography;  Surgeon: Leonie Man, MD;  Location: [facility];  Service: Cardiovascular;  Laterality: N/A;   CARDIAC CATHETERIZATION N/A 11/03/2015   Procedure: Coronary Stent Intervention;  Surgeon: Leonie Man, MD;  Location: [facility];  Service: Cardiovascular;  Laterality: N/A;   CARPAL TUNNEL RELEASE     CORONARY ANGIOPLASTY     1 stent   TOTAL KNEE ARTHROPLASTY Right 04/02/2019   Procedure: TOTAL KNEE ARTHROPLASTY;  Surgeon: Carole Civil, MD;  Location: [facility];  Service: Orthopedics;  Laterality: Right;   Social History   Tobacco Use   Smoking status: Some Days    Packs/day: 0.25    Types: Cigarettes    Last attempt to quit: 05/13/2021    Years since quitting: 0.5   Smokeless tobacco: Never  Vaping Use   Vaping Use: Never used  Substance Use Topics   Alcohol use: No    Alcohol/week: 0.0 standard drinks of alcohol   Drug use: No   Family History  Problem Relation Age of Onset   Heart disease Mother    Stroke Sister    Heart attack Brother    Allergies  Allergen Reactions   Metformin Hcl Other (See Comments)   Current Outpatient Medications on File Prior to Visit  Medication Sig Dispense Refill   aspirin EC 81 MG tablet Take 1 tablet (81 mg total) by mouth 2 (two) times daily. Hold aspirin while you are on apixaban/Eliquis 1 tablet 0   atorvastatin (LIPITOR) 80 MG tablet TAKE 1 TABLET BY MOUTH  ONCE DAILY 6 IN THE EVENING 90 tablet 2   BD PEN NEEDLE NANO 2ND GEN 32G X 4 MM MISC Inject into the skin 3 (three) times daily.     buPROPion (WELLBUTRIN SR) 150 MG 12 hr tablet Take 1 tablet (150 mg total) by mouth 2 (two) times daily. 180 tablet 3   carvedilol (COREG) 12.5 MG tablet Take 1 tablet (12.5 mg total) by mouth 2 (two) times daily with a meal. 180 tablet 2   DULoxetine (CYMBALTA) 60 MG capsule Take 1 capsule (60 mg total) by mouth daily. 90 capsule 3   ferrous sulfate 325 (65 FE) MG tablet Take 325 mg by mouth daily with breakfast.     furosemide (LASIX) 40 MG tablet Take 40 mg by mouth daily. Once a day     HUMULIN 70/30 KWIKPEN (70-30) 100 UNIT/ML KwikPen Inject 15 units in the morning and if she eats 10 units at lunch and 15 units with supper meal (Patient taking differently: Inject 25 units in the morning and if she eats 15units at lunch and 25 units with supper meal) 15 mL 11   levothyroxine (SYNTHROID) 137 MCG tablet Take 150 mcg by mouth daily before breakfast.     losartan (COZAAR) 25 MG tablet Take 0.5 tablets (12.5 mg total) by mouth daily. 45 tablet 1   nitroGLYCERIN (NITROSTAT) 0.4 MG SL tablet Place 1 tablet (0.4 mg total) under the tongue every 5 (five) minutes x 3 doses as needed for chest pain. 25 tablet 3   Potassium Chloride ER 20 MEQ TBCR Take by mouth. 20 meq  once a day     amLODipine (NORVASC) 10 MG tablet Take 1 tablet (10 mg total) by mouth daily. 90 tablet 2   No current facility-administered medications on file prior to visit.    Review of Systems  Constitutional:  Positive for fatigue. Negative for activity change, appetite change, fever and unexpected weight change.  HENT:  Negative for congestion, ear pain, rhinorrhea, sinus pressure and sore throat.   Eyes:  Negative for pain, redness and visual disturbance.  Respiratory:  Negative for cough, shortness of breath and wheezing.  Cardiovascular:  Negative for chest pain and palpitations.   Gastrointestinal:  Negative for abdominal pain, blood in stool, constipation and diarrhea.  Endocrine: Negative for polydipsia and polyuria.  Genitourinary:  Negative for dysuria, frequency and urgency.  Musculoskeletal:  Positive for arthralgias. Negative for back pain and myalgias.  Skin:  Negative for pallor and rash.  Allergic/Immunologic: Negative for environmental allergies.  Neurological:  Negative for dizziness, syncope and headaches.  Hematological:  Negative for adenopathy. Does not bruise/bleed easily.  Psychiatric/Behavioral:  Negative for decreased concentration and dysphoric mood. The patient is not nervous/anxious.        Objective:   Physical Exam Constitutional:      General: She is not in acute distress.    Appearance: Normal appearance. She is well-developed. She is obese. She is not ill-appearing or diaphoretic.  HENT:     Head: Normocephalic and atraumatic.     Mouth/Throat:     Mouth: Mucous membranes are moist.  Eyes:     General: No scleral icterus.    Conjunctiva/sclera: Conjunctivae normal.     Pupils: Pupils are equal, round, and reactive to light.  Neck:     Thyroid: No thyromegaly.     Vascular: No carotid bruit or JVD.  Cardiovascular:     Rate and Rhythm: Normal rate and regular rhythm.     Heart sounds: Normal heart sounds.     No gallop.  Pulmonary:     Effort: Pulmonary effort is normal. No respiratory distress.     Breath sounds: Normal breath sounds. No wheezing or rales.  Abdominal:     General: There is no distension or abdominal bruit.     Palpations: Abdomen is soft.  Musculoskeletal:     Cervical back: Normal range of motion and neck supple. No tenderness.     Right lower leg: No edema.     Left lower leg: No edema.  Lymphadenopathy:     Cervical: No cervical adenopathy.  Skin:    General: Skin is warm and dry.     Coloration: Skin is not jaundiced or pale.     Findings: No bruising, erythema or rash.  Neurological:      Mental Status: She is alert.     Cranial Nerves: No cranial nerve deficit.     Coordination: Coordination normal.     Deep Tendon Reflexes: Reflexes are normal and symmetric. Reflexes normal.  Psychiatric:        Mood and Affect: Mood normal.           Assessment & Plan:   Problem List Items Addressed This Visit       Cardiovascular and Mediastinum   Essential hypertension (Chronic)    bp is now stable after hosp for acute renal failure   bp in fair control at this time  BP Readings from Last 1 Encounters:  12/01/21 (!) 122/54   No changes needed Most recent labs reviewed  Disc lifstyle change with low sodium diet and exercise  Seeing nephrology and cardiology Coreg 12.5 mg bid  Amlodipine 10 mg daily  Lasix 40 mg daily  Is back on losartan 12.5 mg daily       Relevant Medications   furosemide (LASIX) 40 MG tablet     Digestive   Nausea & vomiting    This was from acute renal failure Reviewed hospital records, lab results and studies in detail  Now resolved and eating/drinking normally         Endocrine   Diabetic  nephropathy ([place])    Recent hosp for renal failure and dehydration  Reviewed hospital records, lab results and studies in detail  Clinically improved Needs f/u with endocrinology      Type 2 diabetes mellitus ([place])    Insulin dose reduced with hosp for renal failure  Doing better  In need of f/u with endocrinology now that she is eating better         Genitourinary   AKI (acute kidney injury) ([place]) - Primary    Recent hosp with nephrotic syndrome  Reviewed hospital records, lab results and studies in detail  Now much improved  Has f/u with nephrology (next appt is 3 wk) On lower dose of losartan and now back on lasix for edema  Her GFR is back to 18 last week      CKD (chronic kidney disease) stage 4, GFR 15-29 ml/min ([facility])    Back to baseline now after hosp for renal failure with dehydration  With nephrotic syndrome / DM Seen at  [facility]  F/u planned 3 week GFR of 18 last week  Drinking fluids  On low dose losartan and lasix        Other   Normocytic anemia    Due to CKD Seeing nephrology  No treatment yet       Pedal edema    Back on lasix with better renal fxn Has nephrotic syndrome from DM2

## 2021-12-01 NOTE — Patient Instructions (Addendum)
Gradually increase activity as tolerated  Use your walker to start until you get stronger   Continue your current medicines Follow up with nephrology and endocrinology as planned   Watch your blood sugar  Don't skip meals   If any symptoms return let us know

## 2021-12-01 NOTE — Assessment & Plan Note (Signed)
Back to baseline now after hosp for renal failure with dehydration  With nephrotic syndrome / DM Seen at [facility]  F/u planned 3 week GFR of 18 last week  Drinking fluids  On low dose losartan and lasix

## 2021-12-01 NOTE — Assessment & Plan Note (Signed)
This was from acute renal failure Reviewed hospital records, lab results and studies in detail  Now resolved and eating/drinking normally

## 2021-12-01 NOTE — Assessment & Plan Note (Signed)
Insulin dose reduced with hosp for renal failure  Doing better  In need of f/u with endocrinology now that she is eating better

## 2021-12-01 NOTE — Assessment & Plan Note (Signed)
Recent hosp for renal failure and dehydration  Reviewed hospital records, lab results and studies in detail  Clinically improved Needs f/u with endocrinology

## 2021-12-01 NOTE — Assessment & Plan Note (Signed)
bp is now stable after hosp for acute renal failure   bp in fair control at this time  BP Readings from Last 1 Encounters:  12/01/21 (!) 122/54   No changes needed Most recent labs reviewed  Disc lifstyle change with low sodium diet and exercise  Seeing nephrology and cardiology Coreg 12.5 mg bid  Amlodipine 10 mg daily  Lasix 40 mg daily  Is back on losartan 12.5 mg daily

## 2021-12-01 NOTE — Assessment & Plan Note (Signed)
Back on lasix with better renal fxn Has nephrotic syndrome from DM2

## 2021-12-01 NOTE — Assessment & Plan Note (Signed)
Due to CKD Seeing nephrology  No treatment yet

## 2021-12-10 ENCOUNTER — Ambulatory Visit
Admission: RE | Admit: 2021-12-10 | Discharge: 2021-12-10 | Disposition: A | Discharge status: Home / Self Care | Payer: Medicare Other | Source: Ambulatory Visit | Attending: Family Medicine | Admitting: Family Medicine

## 2021-12-10 DIAGNOSIS — Z1231 Encounter for screening mammogram for malignant neoplasm of breast: Secondary | ICD-10-CM

## 2021-12-20 ENCOUNTER — Other Ambulatory Visit: Payer: Self-pay | Admitting: *Deleted

## 2021-12-20 MED ORDER — AMLODIPINE BESYLATE 10 MG PO TABS: 10.0000 mg | ORAL_TABLET | Freq: Every day | ORAL | 0 refills | Status: DC

## 2021-12-20 MED ORDER — CARVEDILOL 12.5 MG PO TABS: 12.5000 mg | ORAL_TABLET | Freq: Two times a day (BID) | ORAL | 0 refills | Status: DC

## 2021-12-20 MED ORDER — ATORVASTATIN CALCIUM 80 MG PO TABS: ORAL_TABLET | ORAL | 0 refills | Status: DC

## 2022-01-04 ENCOUNTER — Ambulatory Visit
Admission: EM | Admit: 2022-01-04 | Discharge: 2022-01-04 | Disposition: A | Discharge status: Home / Self Care | Payer: Medicare Other | Attending: Nurse Practitioner | Admitting: Nurse Practitioner

## 2022-01-04 ENCOUNTER — Ambulatory Visit (INDEPENDENT_AMBULATORY_CARE_PROVIDER_SITE_OTHER): Payer: Medicare Other

## 2022-01-04 ENCOUNTER — Encounter: Payer: Self-pay | Admitting: Emergency Medicine

## 2022-01-04 ENCOUNTER — Telehealth: Payer: Self-pay | Admitting: Family Medicine

## 2022-01-04 ENCOUNTER — Other Ambulatory Visit: Payer: Self-pay

## 2022-01-04 DIAGNOSIS — S82832A Other fracture of upper and lower end of left fibula, initial encounter for closed fracture: Secondary | ICD-10-CM

## 2022-01-04 DIAGNOSIS — M25552 Pain in left hip: Secondary | ICD-10-CM

## 2022-01-04 DIAGNOSIS — M25572 Pain in left ankle and joints of left foot: Secondary | ICD-10-CM

## 2022-01-04 DIAGNOSIS — R296 Repeated falls: Secondary | ICD-10-CM

## 2022-01-04 DIAGNOSIS — W19XXXA Unspecified fall, initial encounter: Secondary | ICD-10-CM

## 2022-01-04 NOTE — Telephone Encounter (Signed)
Patient's daughter is calling regarding her mom. The patient fell at her Kidney appointment on 7.18.23.  Daughter noticed that her wrist is swollen and she is black and blue from her left hip down, unable to walk    Daughter wants mom to get an x-ray to make sure she hasn't broken her hip  Transferred call to Access nurse

## 2022-01-04 NOTE — Telephone Encounter (Signed)
Still have not gotten access nurse note; I spoke with Barnett Applebaum (DPR signed)and Barnett Applebaum said access would not talk with her even though Barnett Applebaum is on DPR and [place] hung up phone. I apologized and Barnett Applebaum said she is on her way to pick her mom up and take her to UC. Barnett Applebaum said pt could walk and I scheduled appt for pt at [facility] 01/04/22 at 3:30.Barnett Applebaum was appreciative and will take her mom to that UC.I did call [facility] and they do have xray capability. Sending note to Dr Glori Bickers and [place] CMA.

## 2022-01-04 NOTE — ED Provider Notes (Signed)
[facility]    CSN: [id number] Arrival date & time: 01/04/22  1545      History   Chief Complaint Chief Complaint  Patient presents with   Fall    HPI Jennifer [name] is a 66 y.o. female.   Patient presents with daughter today for concern of multiple falls within the past couple of weeks.  About 1 week ago, she fell and has been unable to move her left leg normally since.  She has bruising to her left hip and inside of her left thigh that the daughter noticed a couple of days ago.  She is also having some pain around her ankle since the fall, however denies any discoloring.  She has peripheral neuropathy in her lower extremities and bilateral edema at baseline and this has not worsened.    Past Medical History:  Diagnosis Date   CAD (coronary artery disease)    cath 5/23 100% dist RCA lesion treated with 2 overlapping Integrity Resolute DES ( 2.25x6mm, 2.25x37mm), 60% mid RCA treated medically, 99% OM2 not amenable to PCI, 70% D1 lesion, EF normal   Hypercholesteremia    Hypertension    Hypothyroidism    Stroke ([place]) 10/2018   Thyroid disease    Type 2 diabetes mellitus ([place]) 10/08/2018    Patient Active Problem List   Diagnosis Date Noted   Nausea & vomiting 11/19/2021   AKI (acute kidney injury) ([place]) 11/19/2021   Frequent urination 07/13/2021   Change in mental status 07/13/2021   Headache 07/13/2021   Gait abnormality 07/13/2021   Memory loss 07/13/2021   Abnormal urinalysis 07/13/2021   CKD (chronic kidney disease) stage 4, GFR 15-29 ml/min (HCC) 07/13/2021   Normocytic anemia 07/13/2021   Diabetic nephropathy ([place]) 05/28/2020   Diabetic retinopathy ([place]) 05/28/2020   Presence of insulin pump (external) (internal) 05/28/2020   Diabetic macular edema of right eye with proliferative retinopathy associated with type 2 diabetes mellitus ([place]) 04/16/2020   Macular pucker, right eye 04/16/2020   Vitreous hemorrhage of left eye ([place]) 04/16/2020   Pre-op  examination 03/13/2020   Chronic venous insufficiency 02/06/2020   Constipation 05/28/2019   S/P TKR (total knee replacement), right 03/23/2019 05/07/2019   Primary osteoarthritis of right knee 04/02/2019   Unilateral primary osteoarthritis, right knee    Pedal edema 02/25/2019   Acute vertigo with vomiting and inability to stand 10/08/2018   Type 2 diabetes mellitus ([place]) 10/08/2018   Former smoker 10/08/2018   Carotid artery disease ([place]) 03/15/2018   History of CVA (cerebrovascular accident) 11/16/2017   Morbid obesity ([place]) 11/09/2017   Low back pain 10/26/2017   Screening mammogram, encounter for 09/18/2017   CAD S/P percutaneous coronary angioplasty 11/04/2015   NSTEMI (non-ST elevated myocardial infarction) ([place]) 11/03/2015   Hypothyroidism 07/06/2010   Hyperlipidemia associated with type 2 diabetes mellitus ([place]) 08/28/2008   Essential hypertension 11/13/2007   Proliferative diabetic retinopathy of left eye with macular edema associated with type 2 diabetes mellitus ([place]) 09/21/2006   Diabetic peripheral neuropathy ([place]) 09/21/2006   Adjustment disorder with mixed anxiety and depressed mood 09/21/2006   CARPAL TUNNEL SYNDROME 09/21/2006   COPD (chronic obstructive pulmonary disease) ([place]) 09/21/2006   EDEMA 09/21/2006   MIGRAINES, HX OF 09/21/2006    Past Surgical History:  Procedure Laterality Date   ABDOMINAL HYSTERECTOMY     CARDIAC CATHETERIZATION N/A 11/03/2015   Procedure: Left Heart Cath and Coronary Angiography;  Surgeon: Leonie Man, MD;  Location: [facility];  Service: Cardiovascular;  Laterality: N/A;   CARDIAC CATHETERIZATION N/A 11/03/2015   Procedure: Coronary Stent Intervention;  Surgeon: Leonie Man, MD;  Location: [facility];  Service: Cardiovascular;  Laterality: N/A;   CARPAL TUNNEL RELEASE     CORONARY ANGIOPLASTY     1 stent   TOTAL KNEE ARTHROPLASTY Right 04/02/2019   Procedure: TOTAL KNEE ARTHROPLASTY;  Surgeon: Carole Civil, MD;  Location: [facility];  Service: Orthopedics;  Laterality: Right;    OB History   No obstetric history on file.      Home Medications    Prior to Admission medications   Medication Sig Start Date End Date Taking? Authorizing Provider  amLODipine (NORVASC) 10 MG tablet Take 1 tablet (10 mg total) by mouth daily. 12/20/21   Strader, Fransisco Hertz, PA-C  aspirin EC 81 MG tablet Take 1 tablet (81 mg total) by mouth 2 (two) times daily. Hold aspirin while you are on apixaban/Eliquis 04/06/19   Emokpae, Courage, MD  atorvastatin (LIPITOR) 80 MG tablet TAKE 1 TABLET BY MOUTH ONCE DAILY 6 IN THE EVENING 12/20/21   Strader, [place] M, PA-C  BD PEN NEEDLE NANO 2ND GEN 32G X 4 MM MISC Inject into the skin 3 (three) times daily. 07/22/21   [provider]  buPROPion (WELLBUTRIN SR) 150 MG 12 hr tablet Take 1 tablet (150 mg total) by mouth 2 (two) times daily. 09/27/21   [name], [name], MD  carvedilol (COREG) 12.5 MG tablet Take 1 tablet (12.5 mg total) by mouth 2 (two) times daily with a meal. 12/20/21   Strader, [place] M, PA-C  DULoxetine (CYMBALTA) 60 MG capsule Take 1 capsule (60 mg total) by mouth daily. 09/27/21   [name], [name], MD  ferrous sulfate 325 (65 FE) MG tablet Take 325 mg by mouth daily with breakfast.    [provider]  furosemide (LASIX) 40 MG tablet Take 40 mg by mouth daily. Once a day    [provider]  HUMULIN 70/30 KWIKPEN (70-30) 100 UNIT/ML KwikPen Inject 15 units in the morning and if she eats 10 units at lunch and 15 units with supper meal Patient taking differently: Inject 25 units in the morning and if she eats 15units at lunch and 25 units with supper meal 11/22/21   Johnson, Clanford L, MD  levothyroxine (SYNTHROID) 137 MCG tablet Take 150 mcg by mouth daily before breakfast.    [provider]  losartan (COZAAR) 25 MG tablet Take 0.5 tablets (12.5 mg total) by mouth daily. 11/23/21 02/21/22  Johnson, Clanford L, MD  nitroGLYCERIN  (NITROSTAT) 0.4 MG SL tablet Place 1 tablet (0.4 mg total) under the tongue every 5 (five) minutes x 3 doses as needed for chest pain. 03/15/18   Erlene Quan, PA-C  Potassium Chloride ER 20 MEQ TBCR Take by mouth. 20 meq  once a day    [provider]    Family History Family History  Problem Relation Age of Onset   Heart disease Mother    Stroke Sister    Heart attack Brother    Breast cancer Neg Hx     Social History Social History   Tobacco Use   Smoking status: Some Days    Packs/day: 0.25    Types: Cigarettes    Last attempt to quit: 05/13/2021    Years since quitting: 0.6   Smokeless tobacco: Never  Vaping Use   Vaping Use: Never used  Substance Use Topics   Alcohol  use: No    Alcohol/week: 0.0 standard drinks of alcohol   Drug use: No     Allergies   Metformin hcl   Review of Systems Review of Systems Per HPI  Physical Exam Triage Vital Signs ED Triage Vitals [01/04/22 1606]  Enc Vitals Group     BP (!) 173/68     Pulse Rate 75     Resp 20     Temp 98 F (36.7 C)     Temp Source Oral     SpO2 95 %     Weight      Height      Head Circumference      Peak Flow      Pain Score 5     Pain Loc      Pain Edu?      Excl. in [place]?    No data found.  Updated Vital Signs BP (!) 173/68 (BP Location: Right Arm)   Pulse 75   Temp 98 F (36.7 C) (Oral)   Resp 20   SpO2 95%   Visual Acuity Right Eye Distance:   Left Eye Distance:   Bilateral Distance:    Right Eye Near:   Left Eye Near:    Bilateral Near:     Physical Exam Vitals reviewed.  Constitutional:      General: She is not in acute distress.    Appearance: Normal appearance. She is not toxic-appearing.  HENT:     Mouth/Throat:     Mouth: Mucous membranes are moist.     Pharynx: Oropharynx is clear.  Pulmonary:     Effort: Pulmonary effort is normal. No respiratory distress.  Musculoskeletal:     Right lower leg: Edema (baseline) present.     Left lower leg: Edema  (baseline) present.  Skin:    General: Skin is warm and dry.     Capillary Refill: Capillary refill takes less than 2 seconds.     Coloration: Skin is not jaundiced or pale.     Findings: Bruising present.          Comments: Inspection: No obvious deformity, redness appreciated to left lower extremity.  There is bruising to the posterior buttocks in the area marked.   Palpation: Left ankle diffusely tender to palpation; the left hip joint is tender to palpation anteriorly.  Otherwise, no tenderness to palpation and no obvious deformities palpated.  ROM: Full ROM to left hip; unable to flex left foot completely. Strength: 5/5 left lower extremity Neurovascular: neurovascularly intact in left lower extremity   Neurological:     Mental Status: She is alert and oriented to person, place, and time.     Gait: Gait abnormal (walking with walker; not baseline).  Psychiatric:        Behavior: Behavior is cooperative.      UC Treatments / Results  Labs (all labs ordered are listed, but only abnormal results are displayed) Labs Reviewed - No data to display  EKG   Radiology DG Ankle Complete Left  Result Date: 01/04/2022 CLINICAL DATA:  Left hip and leg pain since falling 1 week ago. EXAM: LEFT ANKLE COMPLETE - 3+ VIEW COMPARISON:  Foot radiographs 07/12/2021. Ankle radiographs 11/06/2009. FINDINGS: Suboptimal positioning. The bones are demineralized. There is suspicion of a nondisplaced fracture of the distal fibula. The distal tibia appears intact. There is no widening of the ankle mortise. Midfoot degenerative changes, and old fracture of the 4th metatarsal and calcaneal spurs are noted. There is generalized  edema throughout the soft tissues without evidence of foreign body. IMPRESSION: Suspicion of acute/subacute fracture of the distal fibula. Recommend immobilization and radiographic follow-up. Generalized soft tissue edema. Electronically Signed   By: Richardean Sale M.D.   On:  01/04/2022 16:55   DG Hip Unilat With Pelvis 2-3 Views Left  Result Date: 01/04/2022 CLINICAL DATA:  Left hip and leg pain since falling 1 week ago. EXAM: DG HIP (WITH OR WITHOUT PELVIS) 2-3V LEFT COMPARISON:  Abdominal radiographs 11/19/2021. Pelvic CT 10/08/2018. FINDINGS: The bones appear mildly demineralized. No evidence of acute fracture, dislocation or femoral head osteonecrosis. Mild degenerative changes within the lower lumbar spine, both hips, both sacroiliac joints and the symphysis pubis. Small pelvic calcifications are similar to previous CT, likely phleboliths. IMPRESSION: No evidence of acute fracture or dislocation. Mild degenerative changes as described. Electronically Signed   By: Richardean Sale M.D.   On: 01/04/2022 16:52    Procedures Procedures (including critical care time)  Medications Ordered in UC Medications - No data to display  Initial Impression / Assessment and Plan / UC Course  I have reviewed the triage vital signs and the nursing notes.  Pertinent labs & imaging results that were available during my care of the patient were reviewed by me and considered in my medical decision making (see chart for details).    Patient is a very pleasant, elderly-appearing 67 year old female presenting for left hip and left ankle pain after multiple falls.  X-ray today of the hip does not show any acute fracture, however x-ray of the ankle shows nondisplaced fracture of left distal fibula.  Patient placed in a short leg posterior splint.  Recommended nonweightbearing and close follow-up with Ortho care.  Patient is unable to tolerate crutches; recommended knee scooter.  Discussed use of Tylenol, ice, elevation as needed for pain.  Patient and daughter were given the opportunity to ask questions and all questions answered.  They are in agreement with this plan and verbalized understanding of these instructions. Final Clinical Impressions(s) / UC Diagnoses   Final diagnoses:   Multiple falls  Left hip pain  Acute left ankle pain  Closed fracture of distal end of left fibula, unspecified fracture morphology, initial encounter     Discharge Instructions      - The hip x-ray today does not show an acute fracture.  - The ankle x-ray today shows the the fibula is broken. - Please call Ortho Care to make an appointment to follow up - We have placed you in a splint today - please wear this until you are able to follow up with Ortho Care - Try to keep off of the left leg; you can ice it, elevate it, and use Tylenol 500 mg as needed for pain     ED Prescriptions   None    PDMP not reviewed this encounter.   Eulogio Bear, NP 01/04/22 [phone]

## 2022-01-04 NOTE — Telephone Encounter (Signed)
Aware, will watch for correspondence  

## 2022-01-04 NOTE — Discharge Instructions (Addendum)
-   The hip x-ray today does not show an acute fracture.  - The ankle x-ray today shows the the fibula is broken. - Please call Ortho Care to make an appointment to follow up - We have placed you in a splint today - please wear this until you are able to follow up with Ortho Care - Try to keep off of the left leg; you can ice it, elevate it, and use Tylenol 500 mg as needed for pain

## 2022-01-04 NOTE — ED Triage Notes (Addendum)
Pt family reports was walking out of nephrologist office x1 week ago and reports tripped over mat on the way out. Pt states left hip/leg pain ever since. Pt family reports has noticed bruising to left hip and pt reports left foot pain. Pt uses walker at baseline but has had to increase frequency of use since fall. Pt also has tripped and fallen several times since initial fall.denies being on blood thinners.

## 2022-01-05 ENCOUNTER — Telehealth: Payer: Self-pay | Admitting: Orthopedic Surgery

## 2022-01-05 NOTE — Telephone Encounter (Signed)
Called back to patient; aware of appointment Friday, 01/07/22.

## 2022-01-05 NOTE — Telephone Encounter (Signed)
Add on Friday

## 2022-01-05 NOTE — Telephone Encounter (Signed)
Call received from patient (established patient/ new injury) following  Urgent Care visit last night - please review based on current template and notes / imaging: "IMPRESSION: Suspicion of acute/subacute fracture of the distal fibula. Recommend immobilization and radiographic follow-up. Generalized soft tissue Edema"  - patient is in temporary 'soft' cast   Thank you

## 2022-01-07 ENCOUNTER — Encounter: Payer: Self-pay | Admitting: Orthopedic Surgery

## 2022-01-07 ENCOUNTER — Ambulatory Visit (INDEPENDENT_AMBULATORY_CARE_PROVIDER_SITE_OTHER): Payer: Medicare Other

## 2022-01-07 ENCOUNTER — Ambulatory Visit (INDEPENDENT_AMBULATORY_CARE_PROVIDER_SITE_OTHER): Payer: Medicare Other | Admitting: Orthopedic Surgery

## 2022-01-07 VITALS — BP 130/77 | HR 67 | Ht 67.0 in | Wt 230.0 lb

## 2022-01-07 DIAGNOSIS — E1165 Type 2 diabetes mellitus with hyperglycemia: Secondary | ICD-10-CM | POA: Insufficient documentation

## 2022-01-07 DIAGNOSIS — S82892A Other fracture of left lower leg, initial encounter for closed fracture: Secondary | ICD-10-CM | POA: Diagnosis not present

## 2022-01-07 NOTE — Progress Notes (Signed)
Chief Complaint  Patient presents with   Ankle Injury    Left ankle fracture injury fall at kidney doctor office     HPI: New problem  66 year old female fell at the kidney doctor's office on July 25 with complaints of ankle pain and swelling.  Splint was applied.  Splint was removed there was distal swelling in the foot from the point where the splint ended  Past Medical History:  Diagnosis Date   CAD (coronary artery disease)    cath 5/23 100% dist RCA lesion treated with 2 overlapping Integrity Resolute DES ( 2.25x46mm, 2.25x61mm), 60% mid RCA treated medically, 99% OM2 not amenable to PCI, 70% D1 lesion, EF normal   Hypercholesteremia    Hypertension    Hypothyroidism    Stroke ([place]) 10/2018   Thyroid disease    Type 2 diabetes mellitus ([place]) 10/08/2018    BP 130/77   Pulse 67   Ht 5\' 7"  (1.702 m)   Wt 230 lb (104.3 kg)   BMI 36.02 kg/m    General appearance: Well-developed well-nourished no gross deformities, mesomorphic  Cardiovascular normal temperature of the foot Neurologically normal pain response at the foot level and ankle level Psychological: Awake alert and oriented x3 mood and affect normal  Skin ecchymotic skin lots of callus around the skin it has the appearance of bark  Musculoskeletal: Foot is plantigrade swelling is noted lateral malleolar tenderness minimal medial malleolar tenderness swelling of the foot distal to the point where the splint ended  Imaging first image showed a suggestion of a lateral malleolus fracture this was an outside image.  My interpretation is that the films were inadequate to assess the malleolar fracture so it was repeated 3 views of the left ankle in the office show a intact ankle mortise with a nondisplaced Weber B fracture of the fibula left ankle  A/P  Patient tolerated the short cam walker  Weight-bear as tolerated  X-ray in 2 weeks check fracture alignment

## 2022-01-23 ENCOUNTER — Emergency Department (HOSPITAL_COMMUNITY): Payer: Medicare Other

## 2022-01-23 ENCOUNTER — Encounter (HOSPITAL_COMMUNITY): Payer: Self-pay

## 2022-01-23 ENCOUNTER — Other Ambulatory Visit: Payer: Self-pay

## 2022-01-23 ENCOUNTER — Emergency Department (HOSPITAL_COMMUNITY)
Admission: EM | Admit: 2022-01-23 | Discharge: 2022-01-23 | Disposition: A | Discharge status: Home / Self Care | Payer: Medicare Other | Attending: Emergency Medicine | Admitting: Emergency Medicine

## 2022-01-23 DIAGNOSIS — E039 Hypothyroidism, unspecified: Secondary | ICD-10-CM | POA: Diagnosis not present

## 2022-01-23 DIAGNOSIS — R630 Anorexia: Secondary | ICD-10-CM | POA: Diagnosis not present

## 2022-01-23 DIAGNOSIS — Z79899 Other long term (current) drug therapy: Secondary | ICD-10-CM | POA: Diagnosis not present

## 2022-01-23 DIAGNOSIS — I2510 Atherosclerotic heart disease of native coronary artery without angina pectoris: Secondary | ICD-10-CM | POA: Diagnosis not present

## 2022-01-23 DIAGNOSIS — W19XXXA Unspecified fall, initial encounter: Secondary | ICD-10-CM | POA: Insufficient documentation

## 2022-01-23 DIAGNOSIS — R112 Nausea with vomiting, unspecified: Secondary | ICD-10-CM | POA: Insufficient documentation

## 2022-01-23 DIAGNOSIS — Z7982 Long term (current) use of aspirin: Secondary | ICD-10-CM | POA: Insufficient documentation

## 2022-01-23 DIAGNOSIS — R600 Localized edema: Secondary | ICD-10-CM | POA: Diagnosis not present

## 2022-01-23 DIAGNOSIS — Z794 Long term (current) use of insulin: Secondary | ICD-10-CM | POA: Diagnosis not present

## 2022-01-23 DIAGNOSIS — N184 Chronic kidney disease, stage 4 (severe): Secondary | ICD-10-CM | POA: Diagnosis not present

## 2022-01-23 DIAGNOSIS — R531 Weakness: Secondary | ICD-10-CM | POA: Diagnosis not present

## 2022-01-23 DIAGNOSIS — I129 Hypertensive chronic kidney disease with stage 1 through stage 4 chronic kidney disease, or unspecified chronic kidney disease: Secondary | ICD-10-CM | POA: Diagnosis not present

## 2022-01-23 LAB — URINALYSIS, ROUTINE W REFLEX MICROSCOPIC
Bilirubin Urine: NEGATIVE
Glucose, UA: 150 mg/dL — AB
Ketones, ur: NEGATIVE mg/dL
Leukocytes,Ua: NEGATIVE
Nitrite: NEGATIVE
Protein, ur: >=300 mg/dL — AB
Specific Gravity, Urine: 1.018 (ref 1.005–1.030)
pH: 6 (ref 5.0–8.0)

## 2022-01-23 LAB — CBC WITH DIFFERENTIAL/PLATELET
Abs Immature Granulocytes: 0.02 10*3/uL (ref 0.00–0.07)
Basophils Absolute: 0.1 10*3/uL (ref 0.0–0.1)
Basophils Relative: 1 %
Eosinophils Absolute: 0 10*3/uL (ref 0.0–0.5)
Eosinophils Relative: 1 %
HCT: 31.4 % — ABNORMAL LOW (ref 36.0–46.0)
Hemoglobin: 10 g/dL — ABNORMAL LOW (ref 12.0–15.0)
Immature Granulocytes: 0 %
Lymphocytes Relative: 8 %
Lymphs Abs: 0.5 10*3/uL — ABNORMAL LOW (ref 0.7–4.0)
MCH: 27.5 pg (ref 26.0–34.0)
MCHC: 31.8 g/dL (ref 30.0–36.0)
MCV: 86.3 fL (ref 80.0–100.0)
Monocytes Absolute: 0.7 10*3/uL (ref 0.1–1.0)
Monocytes Relative: 12 %
Neutro Abs: 4.5 10*3/uL (ref 1.7–7.7)
Neutrophils Relative %: 78 %
Platelets: 221 10*3/uL (ref 150–400)
RBC: 3.64 MIL/uL — ABNORMAL LOW (ref 3.87–5.11)
RDW: 14.1 % (ref 11.5–15.5)
WBC: 5.8 10*3/uL (ref 4.0–10.5)
nRBC: 0 % (ref 0.0–0.2)

## 2022-01-23 LAB — HEPATIC FUNCTION PANEL
ALT: 18 U/L (ref 0–44)
AST: 20 U/L (ref 15–41)
Albumin: 2.8 g/dL — ABNORMAL LOW (ref 3.5–5.0)
Alkaline Phosphatase: 76 U/L (ref 38–126)
Bilirubin, Direct: 0.1 mg/dL (ref 0.0–0.2)
Indirect Bilirubin: 0.5 mg/dL (ref 0.3–0.9)
Total Bilirubin: 0.6 mg/dL (ref 0.3–1.2)
Total Protein: 6.3 g/dL — ABNORMAL LOW (ref 6.5–8.1)

## 2022-01-23 LAB — BASIC METABOLIC PANEL
Anion gap: 8 (ref 5–15)
BUN: 43 mg/dL — ABNORMAL HIGH (ref 8–23)
CO2: 24 mmol/L (ref 22–32)
Calcium: 8.7 mg/dL — ABNORMAL LOW (ref 8.9–10.3)
Chloride: 104 mmol/L (ref 98–111)
Creatinine, Ser: 2.74 mg/dL — ABNORMAL HIGH (ref 0.44–1.00)
GFR, Estimated: 19 mL/min — ABNORMAL LOW (ref >60)
Glucose, Bld: 204 mg/dL — ABNORMAL HIGH (ref 70–99)
Potassium: 4.1 mmol/L (ref 3.5–5.1)
Sodium: 136 mmol/L (ref 135–145)

## 2022-01-23 LAB — LACTIC ACID, PLASMA
Lactic Acid, Venous: 1 mmol/L (ref 0.5–1.9)
Lactic Acid, Venous: 0.5 mmol/L (ref 0.5–1.9)

## 2022-01-23 LAB — CBG MONITORING, ED: Glucose-Capillary: 184 mg/dL — ABNORMAL HIGH (ref 70–99)

## 2022-01-23 NOTE — Discharge Instructions (Signed)
You are seen in the emergency department for weakness, nausea and vomiting.  Temperature was slightly elevated but we did not find any signs of infection on your urine or in your chest x-ray.  Your work-up is also reassuringly normal, you do have some kidney injury but this is roughly baseline compared to previously.  Please follow-up with your primary this week for reevaluation.  Return to ED for new and concerning symptoms.

## 2022-01-23 NOTE — ED Provider Notes (Signed)
[facility] Provider Note   CSN: [id number] Arrival date & time: 01/23/22  1548     History  Chief Complaint  Patient presents with   Weakness    Jennifer [name] is a 66 y.o. female.   Weakness    Patient with medical hx of hypertension, hypothyroid, hyperlipidemia, CAD, type 2 diabetes, baseline peripheral edema bilaterally, stage IV renal disease presents today due to nausea, vomiting and weakness.  For the last 2 days patient has been having episodes of nonbilious nonbloody emesis, decreased oral intake.  denies any abdominal pain, states last time she felt this way she had an acute kidney injury and needed to come into the hospital.  She has not had any fevers at home but her temperature is elevated 100.1 in triage.  She endorses falling a lot, states she is falling a lot at baseline but she is fallen 4 times since last evening.  Denies hitting her head or losing consciousness, she does not feel short of breath or have any chest pain.  Home Medications Prior to Admission medications   Medication Sig Start Date End Date Taking? Authorizing Provider  amLODipine (NORVASC) 10 MG tablet Take 1 tablet (10 mg total) by mouth daily. 12/20/21   Strader, Fransisco Hertz, PA-C  aspirin EC 81 MG tablet Take 1 tablet (81 mg total) by mouth 2 (two) times daily. Hold aspirin while you are on apixaban/Eliquis 04/06/19   Emokpae, Courage, MD  atorvastatin (LIPITOR) 80 MG tablet TAKE 1 TABLET BY MOUTH ONCE DAILY 6 IN THE EVENING 12/20/21   Strader, [place] M, PA-C  BD PEN NEEDLE NANO 2ND GEN 32G X 4 MM MISC Inject into the skin 3 (three) times daily. 07/22/21   [provider]  buPROPion (WELLBUTRIN SR) 150 MG 12 hr tablet Take 1 tablet (150 mg total) by mouth 2 (two) times daily. 09/27/21   [name], [name], MD  carvedilol (COREG) 12.5 MG tablet Take 1 tablet (12.5 mg total) by mouth 2 (two) times daily with a meal. 12/20/21   Strader, [place] M, PA-C  DULoxetine (CYMBALTA) 60 MG  capsule Take 1 capsule (60 mg total) by mouth daily. 09/27/21   [name], [name], MD  ferrous sulfate 325 (65 FE) MG tablet Take 325 mg by mouth daily with breakfast.    [provider]  furosemide (LASIX) 40 MG tablet Take 40 mg by mouth daily. Once a day    [provider]  HUMULIN 70/30 KWIKPEN (70-30) 100 UNIT/ML KwikPen Inject 15 units in the morning and if she eats 10 units at lunch and 15 units with supper meal Patient taking differently: Inject 25 units in the morning and if she eats 15units at lunch and 25 units with supper meal 11/22/21   Johnson, Clanford L, MD  levothyroxine (SYNTHROID) 137 MCG tablet Take 150 mcg by mouth daily before breakfast.    [provider]  levothyroxine (SYNTHROID) 150 MCG tablet Take 150 mcg by mouth daily. 01/06/22   [provider]  losartan (COZAAR) 25 MG tablet Take 0.5 tablets (12.5 mg total) by mouth daily. 11/23/21 02/21/22  Johnson, Clanford L, MD  nitroGLYCERIN (NITROSTAT) 0.4 MG SL tablet Place 1 tablet (0.4 mg total) under the tongue every 5 (five) minutes x 3 doses as needed for chest pain. 03/15/18   Erlene Quan, PA-C  Potassium Chloride ER 20 MEQ TBCR Take by mouth. 20 meq  once a day    [provider]  Allergies    Metformin hcl    Review of Systems   Review of Systems  Neurological:  Positive for weakness.    Physical Exam Updated Vital Signs BP (!) 180/73 (BP Location: Right Arm)   Pulse 80   Temp 100.1 F (37.8 C) (Oral)   Resp 20   Ht 5\' 7"  (1.702 m)   Wt 111.1 kg   SpO2 93%   BMI 38.37 kg/m  Physical Exam Vitals and nursing note reviewed. Exam conducted with a chaperone present.  Constitutional:      Appearance: Normal appearance.  HENT:     Head: Normocephalic and atraumatic.     Mouth/Throat:     Mouth: Mucous membranes are dry.  Eyes:     General: No scleral icterus.       Right eye: No discharge.        Left eye: No discharge.     Extraocular Movements: Extraocular  movements intact.     Pupils: Pupils are equal, round, and reactive to light.  Cardiovascular:     Rate and Rhythm: Normal rate and regular rhythm.     Pulses: Normal pulses.     Heart sounds: Normal heart sounds. No murmur heard.    No friction rub. No gallop.  Pulmonary:     Effort: Pulmonary effort is normal. No respiratory distress.     Breath sounds: Normal breath sounds.  Abdominal:     General: Abdomen is flat. Bowel sounds are normal. There is no distension.     Palpations: Abdomen is soft.     Tenderness: There is no abdominal tenderness.  Musculoskeletal:     Right lower leg: Edema present.     Left lower leg: Edema present.     Comments: Bilateral pitting edema to the lower extremity.  Skin:    General: Skin is warm and dry.     Coloration: Skin is not jaundiced.  Neurological:     Mental Status: She is alert. Mental status is at baseline.     Coordination: Coordination normal.     ED Results / Procedures / Treatments   Labs (all labs ordered are listed, but only abnormal results are displayed) Labs Reviewed  BASIC METABOLIC PANEL  CBC WITH DIFFERENTIAL/PLATELET  URINALYSIS, ROUTINE W REFLEX MICROSCOPIC  HEPATIC FUNCTION PANEL  CBG MONITORING, ED    EKG None  Radiology No results found.  Procedures Procedures    Medications Ordered in ED Medications - No data to display  ED Course/ Medical Decision Making/ A&P                           Medical Decision Making Amount and/or Complexity of Data Reviewed Labs: ordered. Radiology: ordered.   Patient presents due to generalized weakness, nausea and vomiting.  Differential includes but not limited to AKI, gross likely derangement, sepsis, infectious etiology, anemia, arrhythmia, intracranial mass, pneumonia, UTI.  Exam is not particularly revealing, there is no focal deficits.  Temperature is elevated 100.1 but she not technically febrile.  No cervical spine tenderness, based on Nexus CT spine  criteria I do not think cervical spine imaging is needed.  No traumatic findings on exam patient is moving extremities well.  Independent history from chart review.  Patient daughter is also at bedside.  Patient has been having multiple episodes of fall and have seen chart she is not seen in ED for this previously.  Chest was an AKI for which she is  being set up with nephrology but is not on dialysis.  I ordered and viewed laboratory work-up. -CBC without leukocytosis, stable anemia with a hemoglobin of 10.   -CMP shows elevated creatinine at 2.74 but is actually improved from her baseline.  No gross electrolyte derangement, no transaminitis.   -Lactic acid is reassuringly low at 1. -UA with proteinuria but no underlying infection.  Suspect the proteinuria secondary to her renal disease.  I ordered and reviewed CT head and plain film of chest.  Agree with radiologist interpretation, no acute process.  EKG without any underlying arrhythmia noted.  Discussed work-up with patient and her daughter who is in the room.  Not think any additional work-up is needed in the emergency department.  She will follow-up with her primary this week for reevaluation.    Discussed HPI, physical exam and plan of care for this patient with attending Dorie Rank. The attending physician evaluated this patient as part of a shared visit and agrees with plan of care.         Final Clinical Impression(s) / ED Diagnoses Final diagnoses:  None    Rx / DC Orders ED Discharge Orders     None         Sherrill Raring, Hershal Coria 01/23/22 2207    Dorie Rank, MD 01/24/22 [phone]

## 2022-01-23 NOTE — ED Triage Notes (Signed)
Pt to er, pt states that she is here because she has been falling a lot, states that she has also been vomiting several times over the past two days.  States that pt also had some confusions earlier today

## 2022-01-26 ENCOUNTER — Ambulatory Visit (INDEPENDENT_AMBULATORY_CARE_PROVIDER_SITE_OTHER): Payer: Medicare Other | Admitting: Family Medicine

## 2022-01-26 ENCOUNTER — Ambulatory Visit: Payer: PRIVATE HEALTH INSURANCE | Admitting: Family Medicine

## 2022-01-26 ENCOUNTER — Encounter: Payer: Self-pay | Admitting: Family Medicine

## 2022-01-26 VITALS — BP 160/80 | HR 71 | Ht 67.0 in | Wt 226.4 lb

## 2022-01-26 DIAGNOSIS — R112 Nausea with vomiting, unspecified: Secondary | ICD-10-CM | POA: Diagnosis not present

## 2022-01-26 DIAGNOSIS — E1165 Type 2 diabetes mellitus with hyperglycemia: Secondary | ICD-10-CM

## 2022-01-26 DIAGNOSIS — I10 Essential (primary) hypertension: Secondary | ICD-10-CM | POA: Diagnosis not present

## 2022-01-26 DIAGNOSIS — S82892S Other fracture of left lower leg, sequela: Secondary | ICD-10-CM

## 2022-01-26 DIAGNOSIS — S82892A Other fracture of left lower leg, initial encounter for closed fracture: Secondary | ICD-10-CM | POA: Insufficient documentation

## 2022-01-26 DIAGNOSIS — N184 Chronic kidney disease, stage 4 (severe): Secondary | ICD-10-CM

## 2022-01-26 DIAGNOSIS — Z794 Long term (current) use of insulin: Secondary | ICD-10-CM

## 2022-01-26 NOTE — Assessment & Plan Note (Signed)
Under care of ortho Not wearing boot due to her baseline pedal edema Enc her to call them and disc other opt if possible (? Wrap)

## 2022-01-26 NOTE — Assessment & Plan Note (Signed)
Under care of endocrinology  Also nephrology for CKD Suspect some DM gastroparesis  Working to keep glucose in control On insulin

## 2022-01-26 NOTE — Assessment & Plan Note (Signed)
Seen in ER on 8/13 Reviewed hospital records, lab results and studies in detail  Resolved today  Nausea comes and goes  Suspect due to CKD and diabetic gastroparesis  For specialist f/u  Reviewed imp of hydration  Small freq meals with protein

## 2022-01-26 NOTE — Patient Instructions (Addendum)
Keep drinking fluids  Stay out of the heat   Keep up with the kidney doctor    Talk to your endocrinologist (Dr Chalmers Cater) about nausea as well  You may have some diabetic gastroparesis   Peanut butter is a great option Keep eating vegetables  Small amounts frequently   Let the orthopedic doctor know that you are not wearing a boot due to swelling  Ask ortho if it is ok to order some physical therapy at home also  Let me know and I can order it

## 2022-01-26 NOTE — Progress Notes (Signed)
Subjective:    Patient ID: Jennifer [name], female    DOB: 1956/03/21, 66 y.o.   MRN: [id number]  HPI Pt presents for ER visit f/u (weakness)  Wt Readings from Last 3 Encounters:  01/26/22 226 lb 6.4 oz (102.7 kg)  01/23/22 245 lb (111.1 kg)  01/07/22 230 lb (104.3 kg)   35.46 kg/m   She was seen 8/13 for n/v and weakness Decreased po intake and vomiting for 2 d ,  no  abd pain  Temp was elevated at 100.1 in triage  Noted poor balance and falling a lot    Lab Results  Component Value Date   CREATININE 2.74 (H) 01/23/2022   BUN 43 (H) 01/23/2022   NA 136 01/23/2022   K 4.1 01/23/2022   CL 104 01/23/2022   CO2 24 01/23/2022   GFR 19 -actually improved  Lab Results  Component Value Date   WBC 5.8 01/23/2022   HGB 10.0 (L) 01/23/2022   HCT 31.4 (L) 01/23/2022   MCV 86.3 01/23/2022   PLT 221 01/23/2022   Lactic acid low at 1 Ua with proteinuria but not infx   CT head CT Head Wo Contrast (Accession [id number]) (Order [id number]) Imaging Date: 01/23/2022 Department: [facility] EMERGENCY DEPARTMENT Released By/Authorizing: Sherrill Raring, PA-C (auto-released)   Exam Status  Status  Final [99]   PACS Intelerad Image Link   Show images for CT Head Wo Contrast  Study Result  Narrative & Impression  CLINICAL DATA:  Trauma.  Pain.   EXAM: CT HEAD WITHOUT CONTRAST   TECHNIQUE: Contiguous axial images were obtained from the base of the skull through the vertex without intravenous contrast.   RADIATION DOSE REDUCTION: This exam was performed according to the departmental dose-optimization program which includes automated exposure control, adjustment of the mA and/or kV according to patient size and/or use of iterative reconstruction technique.   COMPARISON:  July 14, 2021   FINDINGS: Brain: No subdural, epidural, or subarachnoid hemorrhage. There is a lacunar infarct in the left cerebellar hemisphere, stable. Cerebellum, brainstem, and basal cisterns  otherwise normal. Ventricles and sulci are unremarkable. White matter changes are stable. No acute cortical ischemia or interval infarct. No mass effect or midline shift.   Vascular: Calcified atherosclerotic changes in the intracranial carotids.   Skull: Normal. Negative for fracture or focal lesion.   Sinuses/Orbits: No acute finding.   Other: None.   IMPRESSION: Chronic white matter changes.  No acute intracranial abnormalities.      She was hydrated   Noted fall at nephrology office on July 25th and fractured L ankle Under ortho care   Not wearing her boot  Does not want to   DM with insulin use  Sees Dr Chalmers Cater Lab Results  Component Value Date   HGBA1C 8.6 (H) 11/19/2021   Had a low reading the day after the ER Last night 200s    Family notes she sits all day in the sun on the porch  Gets overheated  Thinks heat exhaustion  Temp goes up   Feels - better than she was in the ER She drinks -sips water all day   Ate some spaghetti last night  Banana sandwich before that  Can push herself to eat a bit more    No nausea today thankfully   Did get in the pool- that helps a bit   C/o knee cramping a bit (only area)  No vomiting since ER visit   BP Readings from Last 3  Encounters:  01/26/22 (!) 160/80  01/23/22 (!) 172/70  01/07/22 130/77   Patient Active Problem List   Diagnosis Date Noted   Type 2 diabetes mellitus with hyperglycemia ([place]) 01/07/2022   Nausea & vomiting 11/19/2021   AKI (acute kidney injury) ([place]) 11/19/2021   Frequent urination 07/13/2021   Change in mental status 07/13/2021   Headache 07/13/2021   Gait abnormality 07/13/2021   Memory loss 07/13/2021   Abnormal urinalysis 07/13/2021   CKD (chronic kidney disease) stage 4, GFR 15-29 ml/min (HCC) 07/13/2021   Normocytic anemia 07/13/2021   Diabetic nephropathy ([place]) 05/28/2020   Diabetic retinopathy ([place]) 05/28/2020   Presence of insulin pump (external) (internal) 05/28/2020    Diabetic macular edema of right eye with proliferative retinopathy associated with type 2 diabetes mellitus ([place]) 04/16/2020   Macular pucker, right eye 04/16/2020   Vitreous hemorrhage of left eye ([place]) 04/16/2020   Pre-op examination 03/13/2020   Chronic venous insufficiency 02/06/2020   Constipation 05/28/2019   S/P TKR (total knee replacement), right 03/23/2019 05/07/2019   Primary osteoarthritis of right knee 04/02/2019   Unilateral primary osteoarthritis, right knee    Pedal edema 02/25/2019   Acute vertigo with vomiting and inability to stand 10/08/2018   Type 2 diabetes mellitus ([place]) 10/08/2018   Former smoker 10/08/2018   Carotid artery disease ([place]) 03/15/2018   History of CVA (cerebrovascular accident) 11/16/2017   Morbid obesity ([place]) 11/09/2017   Low back pain 10/26/2017   Screening mammogram, encounter for 09/18/2017   CAD S/P percutaneous coronary angioplasty 11/04/2015   NSTEMI (non-ST elevated myocardial infarction) ([place]) 11/03/2015   Hypothyroidism 07/06/2010   Hyperlipidemia associated with type 2 diabetes mellitus ([place]) 08/28/2008   Essential hypertension 11/13/2007   Proliferative diabetic retinopathy of left eye with macular edema associated with type 2 diabetes mellitus ([place]) 09/21/2006   Diabetic peripheral neuropathy ([place]) 09/21/2006   Adjustment disorder with mixed anxiety and depressed mood 09/21/2006   CARPAL TUNNEL SYNDROME 09/21/2006   COPD (chronic obstructive pulmonary disease) ([place]) 09/21/2006   EDEMA 09/21/2006   MIGRAINES, HX OF 09/21/2006   Past Medical History:  Diagnosis Date   CAD (coronary artery disease)    cath 5/23 100% dist RCA lesion treated with 2 overlapping Integrity Resolute DES ( 2.25x71mm, 2.25x2mm), 60% mid RCA treated medically, 99% OM2 not amenable to PCI, 70% D1 lesion, EF normal   Hypercholesteremia    Hypertension    Hypothyroidism    Stroke ([place]) 10/2018   Thyroid disease    Type 2 diabetes mellitus ([place]) 10/08/2018    Past Surgical History:  Procedure Laterality Date   ABDOMINAL HYSTERECTOMY     CARDIAC CATHETERIZATION N/A 11/03/2015   Procedure: Left Heart Cath and Coronary Angiography;  Surgeon: Leonie Man, MD;  Location: [facility];  Service: Cardiovascular;  Laterality: N/A;   CARDIAC CATHETERIZATION N/A 11/03/2015   Procedure: Coronary Stent Intervention;  Surgeon: Leonie Man, MD;  Location: [facility];  Service: Cardiovascular;  Laterality: N/A;   CARPAL TUNNEL RELEASE     CORONARY ANGIOPLASTY     1 stent   TOTAL KNEE ARTHROPLASTY Right 04/02/2019   Procedure: TOTAL KNEE ARTHROPLASTY;  Surgeon: Carole Civil, MD;  Location: [facility];  Service: Orthopedics;  Laterality: Right;   Social History   Tobacco Use   Smoking status: Some Days    Packs/day: 0.25    Types: Cigarettes    Last attempt to quit: 05/13/2021    Years since quitting: 0.7  Smokeless tobacco: Never  Vaping Use   Vaping Use: Never used  Substance Use Topics   Alcohol use: No    Alcohol/week: 0.0 standard drinks of alcohol   Drug use: No   Family History  Problem Relation Age of Onset   Heart disease Mother    Stroke Sister    Heart attack Brother    Breast cancer Neg Hx    Allergies  Allergen Reactions   Metformin Hcl Other (See Comments)   Current Outpatient Medications on File Prior to Visit  Medication Sig Dispense Refill   amLODipine (NORVASC) 10 MG tablet Take 1 tablet (10 mg total) by mouth daily. 90 tablet 0   aspirin EC 81 MG tablet Take 1 tablet (81 mg total) by mouth 2 (two) times daily. Hold aspirin while you are on apixaban/Eliquis 1 tablet 0   atorvastatin (LIPITOR) 80 MG tablet TAKE 1 TABLET BY MOUTH ONCE DAILY 6 IN THE EVENING 90 tablet 0   BD PEN NEEDLE NANO 2ND GEN 32G X 4 MM MISC Inject into the skin 3 (three) times daily.     buPROPion (WELLBUTRIN SR) 150 MG 12 hr tablet Take 1 tablet (150 mg total) by mouth 2 (two) times daily. 180 tablet 3   carvedilol (COREG)  12.5 MG tablet Take 1 tablet (12.5 mg total) by mouth 2 (two) times daily with a meal. 180 tablet 0   DULoxetine (CYMBALTA) 60 MG capsule Take 1 capsule (60 mg total) by mouth daily. 90 capsule 3   ferrous sulfate 325 (65 FE) MG tablet Take 325 mg by mouth daily with breakfast.     furosemide (LASIX) 40 MG tablet Take 40 mg by mouth daily. Once a day     HUMULIN 70/30 KWIKPEN (70-30) 100 UNIT/ML KwikPen Inject 15 units in the morning and if she eats 10 units at lunch and 15 units with supper meal (Patient taking differently: Inject 25 units in the morning and if she eats 15units at lunch and 25 units with supper meal) 15 mL 11   levothyroxine (SYNTHROID) 137 MCG tablet Take 150 mcg by mouth daily before breakfast.     levothyroxine (SYNTHROID) 150 MCG tablet Take 150 mcg by mouth daily.     losartan (COZAAR) 25 MG tablet Take 0.5 tablets (12.5 mg total) by mouth daily. 45 tablet 1   nitroGLYCERIN (NITROSTAT) 0.4 MG SL tablet Place 1 tablet (0.4 mg total) under the tongue every 5 (five) minutes x 3 doses as needed for chest pain. 25 tablet 3   Potassium Chloride ER 20 MEQ TBCR Take by mouth. 20 meq  once a day     No current facility-administered medications on file prior to visit.    Review of Systems  Constitutional:  Positive for fatigue. Negative for activity change, appetite change, fever and unexpected weight change.  HENT:  Negative for congestion, ear pain, rhinorrhea, sinus pressure and sore throat.   Eyes:  Negative for pain, redness and visual disturbance.  Respiratory:  Negative for cough, shortness of breath and wheezing.   Cardiovascular:  Positive for leg swelling. Negative for chest pain and palpitations.  Gastrointestinal:  Positive for nausea. Negative for abdominal pain, blood in stool, constipation, diarrhea and vomiting.  Endocrine: Negative for polydipsia and polyuria.  Genitourinary:  Negative for dysuria, frequency and urgency.  Musculoskeletal:  Positive for arthralgias.  Negative for back pain and myalgias.       Ankle fracture-not wearing boot due to pedal edema   Skin:  Negative for pallor and rash.  Allergic/Immunologic: Negative for environmental allergies.  Neurological:  Negative for dizziness, syncope and headaches.       Generally weak, not focal   Hematological:  Negative for adenopathy. Does not bruise/bleed easily.  Psychiatric/Behavioral:  Negative for decreased concentration and dysphoric mood. The patient is not nervous/anxious.        Objective:   Physical Exam Constitutional:      General: She is not in acute distress.    Appearance: Normal appearance. She is well-developed. She is obese. She is not ill-appearing or diaphoretic.     Comments: Frail appearing  Walking with walker- slow but steady gait   HENT:     Head: Normocephalic and atraumatic.     Mouth/Throat:     Mouth: Mucous membranes are moist.  Eyes:     General: No scleral icterus.    Conjunctiva/sclera: Conjunctivae normal.     Pupils: Pupils are equal, round, and reactive to light.  Neck:     Thyroid: No thyromegaly.     Vascular: No carotid bruit or JVD.  Cardiovascular:     Rate and Rhythm: Normal rate and regular rhythm.     Heart sounds: Normal heart sounds.     No gallop.  Pulmonary:     Effort: Pulmonary effort is normal. No respiratory distress.     Breath sounds: Normal breath sounds. No wheezing or rales.  Abdominal:     General: There is no distension or abdominal bruit.     Palpations: Abdomen is soft.  Musculoskeletal:     Cervical back: Normal range of motion and neck supple.     Right lower leg: Edema present.     Left lower leg: Edema present.     Comments: One plus pedal edema  No leg tenderness    Lymphadenopathy:     Cervical: No cervical adenopathy.  Skin:    General: Skin is warm and dry.     Coloration: Skin is not jaundiced or pale.     Findings: No rash.  Neurological:     Mental Status: She is alert.     Cranial Nerves: No  cranial nerve deficit.     Coordination: Coordination normal.     Deep Tendon Reflexes: Reflexes are normal and symmetric. Reflexes normal.  Psychiatric:        Attention and Perception: She is inattentive. She does not perceive visual hallucinations.        Mood and Affect: Mood is depressed. Affect is flat.     Comments: Pt seems fatigued Somewhat withdrawn  Poor eye contact            Assessment & Plan:   Problem List Items Addressed This Visit       Cardiovascular and Mediastinum   Essential hypertension (Chronic)    bp is up with better hydration  Has nephrology f/u planned         Digestive   Nausea & vomiting - Primary    Seen in ER on 8/13 Reviewed hospital records, lab results and studies in detail  Resolved today  Nausea comes and goes  Suspect due to CKD and diabetic gastroparesis  For specialist f/u  Reviewed imp of hydration  Small freq meals with protein          Endocrine   Type 2 diabetes mellitus with hyperglycemia ([place])    Under care of endocrinology  Also nephrology for CKD Suspect some DM gastroparesis  Working to keep glucose  in control On insulin         Musculoskeletal and Integument   Closed left ankle fracture    Under care of ortho Not wearing boot due to her baseline pedal edema Enc her to call them and disc other opt if possible (? Wrap)        Genitourinary   CKD (chronic kidney disease) stage 4, GFR 15-29 ml/min [facility])    Planning nephrology f/u  Recent ER visit- her cr was sle imp at 2.754 Continues nausea and malaise  Anemia makes her cold (she is picking up her iron suupl today ) Enc her to stay inside to avoid heat illness (has happened in past) Improved with recent IVF

## 2022-01-26 NOTE — Assessment & Plan Note (Signed)
bp is up with better hydration  Has nephrology f/u planned

## 2022-01-26 NOTE — Assessment & Plan Note (Signed)
Planning nephrology f/u  Recent ER visit- her cr was sle imp at 2.754 Continues nausea and malaise  Anemia makes her cold (she is picking up her iron suupl today ) Enc her to stay inside to avoid heat illness (has happened in past) Improved with recent IVF

## 2022-01-29 LAB — CULTURE, BLOOD (ROUTINE X 2)
Culture: NO GROWTH
Culture: NO GROWTH
Special Requests: ADEQUATE
Special Requests: ADEQUATE

## 2022-02-04 ENCOUNTER — Encounter: Payer: Self-pay | Admitting: Orthopedic Surgery

## 2022-02-04 ENCOUNTER — Encounter: Payer: PRIVATE HEALTH INSURANCE | Admitting: Orthopedic Surgery

## 2022-02-21 ENCOUNTER — Encounter (INDEPENDENT_AMBULATORY_CARE_PROVIDER_SITE_OTHER): Payer: Medicare Other | Admitting: Ophthalmology

## 2022-03-18 ENCOUNTER — Other Ambulatory Visit: Payer: Self-pay

## 2022-03-18 ENCOUNTER — Emergency Department (HOSPITAL_COMMUNITY)
Admission: EM | Admit: 2022-03-18 | Discharge: 2022-03-18 | Disposition: A | Discharge status: Home / Self Care | Payer: Medicare Other | Attending: Emergency Medicine | Admitting: Emergency Medicine

## 2022-03-18 ENCOUNTER — Encounter (HOSPITAL_COMMUNITY): Payer: Self-pay | Admitting: *Deleted

## 2022-03-18 DIAGNOSIS — N3000 Acute cystitis without hematuria: Secondary | ICD-10-CM | POA: Insufficient documentation

## 2022-03-18 DIAGNOSIS — Z79899 Other long term (current) drug therapy: Secondary | ICD-10-CM | POA: Diagnosis not present

## 2022-03-18 DIAGNOSIS — N184 Chronic kidney disease, stage 4 (severe): Secondary | ICD-10-CM | POA: Insufficient documentation

## 2022-03-18 DIAGNOSIS — R339 Retention of urine, unspecified: Secondary | ICD-10-CM | POA: Diagnosis present

## 2022-03-18 DIAGNOSIS — R338 Other retention of urine: Secondary | ICD-10-CM

## 2022-03-18 DIAGNOSIS — I2510 Atherosclerotic heart disease of native coronary artery without angina pectoris: Secondary | ICD-10-CM | POA: Insufficient documentation

## 2022-03-18 DIAGNOSIS — I129 Hypertensive chronic kidney disease with stage 1 through stage 4 chronic kidney disease, or unspecified chronic kidney disease: Secondary | ICD-10-CM | POA: Diagnosis not present

## 2022-03-18 DIAGNOSIS — E875 Hyperkalemia: Secondary | ICD-10-CM | POA: Diagnosis not present

## 2022-03-18 DIAGNOSIS — E1122 Type 2 diabetes mellitus with diabetic chronic kidney disease: Secondary | ICD-10-CM | POA: Diagnosis not present

## 2022-03-18 HISTORY — DX: Disorder of kidney and ureter, unspecified: N28.9

## 2022-03-18 LAB — URINALYSIS, ROUTINE W REFLEX MICROSCOPIC
Bilirubin Urine: NEGATIVE
Glucose, UA: 150 mg/dL — AB
Ketones, ur: NEGATIVE mg/dL
Nitrite: NEGATIVE
Protein, ur: >=300 mg/dL — AB
Specific Gravity, Urine: 1.009 (ref 1.005–1.030)
pH: 5 (ref 5.0–8.0)

## 2022-03-18 LAB — CBC WITH DIFFERENTIAL/PLATELET
Abs Immature Granulocytes: 0.05 10*3/uL (ref 0.00–0.07)
Basophils Absolute: 0.1 10*3/uL (ref 0.0–0.1)
Basophils Relative: 0 %
Eosinophils Absolute: 0.1 10*3/uL (ref 0.0–0.5)
Eosinophils Relative: 1 %
HCT: 32.3 % — ABNORMAL LOW (ref 36.0–46.0)
Hemoglobin: 10 g/dL — ABNORMAL LOW (ref 12.0–15.0)
Immature Granulocytes: 0 %
Lymphocytes Relative: 10 %
Lymphs Abs: 1.1 10*3/uL (ref 0.7–4.0)
MCH: 27 pg (ref 26.0–34.0)
MCHC: 31 g/dL (ref 30.0–36.0)
MCV: 87.3 fL (ref 80.0–100.0)
Monocytes Absolute: 0.6 10*3/uL (ref 0.1–1.0)
Monocytes Relative: 5 %
Neutro Abs: 9.4 10*3/uL — ABNORMAL HIGH (ref 1.7–7.7)
Neutrophils Relative %: 84 %
Platelets: 311 10*3/uL (ref 150–400)
RBC: 3.7 MIL/uL — ABNORMAL LOW (ref 3.87–5.11)
RDW: 13.8 % (ref 11.5–15.5)
WBC: 11.2 10*3/uL — ABNORMAL HIGH (ref 4.0–10.5)
nRBC: 0 % (ref 0.0–0.2)

## 2022-03-18 LAB — BASIC METABOLIC PANEL
Anion gap: 7 (ref 5–15)
BUN: 47 mg/dL — ABNORMAL HIGH (ref 8–23)
CO2: 20 mmol/L — ABNORMAL LOW (ref 22–32)
Calcium: 8.7 mg/dL — ABNORMAL LOW (ref 8.9–10.3)
Chloride: 107 mmol/L (ref 98–111)
Creatinine, Ser: 3.35 mg/dL — ABNORMAL HIGH (ref 0.44–1.00)
GFR, Estimated: 15 mL/min — ABNORMAL LOW (ref >60)
Glucose, Bld: 230 mg/dL — ABNORMAL HIGH (ref 70–99)
Potassium: 5.2 mmol/L — ABNORMAL HIGH (ref 3.5–5.1)
Sodium: 134 mmol/L — ABNORMAL LOW (ref 135–145)

## 2022-03-18 MED ORDER — CEPHALEXIN 500 MG PO CAPS
500.0000 mg | ORAL_CAPSULE | Freq: Once | ORAL | Status: AC
  Administered 2022-03-18: 500 mg via ORAL
  Filled 2022-03-18: qty 1

## 2022-03-18 MED ORDER — LOKELMA 10 G PO PACK: 10.0000 g | PACK | Freq: Two times a day (BID) | ORAL | 0 refills | Status: DC

## 2022-03-18 MED ORDER — SODIUM ZIRCONIUM CYCLOSILICATE 5 G PO PACK
10.0000 g | PACK | Freq: Once | ORAL | Status: AC
Start: 2022-03-18 — End: 2022-03-18
  Administered 2022-03-18: 10 g via ORAL
  Filled 2022-03-18: qty 2

## 2022-03-18 MED ORDER — CEPHALEXIN 500 MG PO CAPS: 500.0000 mg | ORAL_CAPSULE | Freq: Two times a day (BID) | ORAL | 0 refills | Status: DC

## 2022-03-18 NOTE — ED Triage Notes (Signed)
Pt with lower abd pain, not voided since last night per pt. Family states she takes lasix and took this morning.

## 2022-03-18 NOTE — ED Provider Notes (Signed)
[facility] EMERGENCY DEPARTMENT Provider Note   CSN: [id number] Arrival date & time: 03/18/22  1455     History  Chief Complaint  Patient presents with   Urinary Retention    Jennifer [name] is a 66 y.o. female.  Patient known to have severe chronic kidney disease.  Patient with a complaint of lower abdominal discomfort that has not voided since last night.  Patient feels as if her bladder is full.  Family says she does take Lasix and she did take that this morning.  No nausea vomiting no fevers no shortness of breath oxygen saturations here on room air were initially 96% currently she is 100% on room air.  Patient being seen by nephrology not started on dialysis yet.  Patient known to have high cholesterol coronary artery disease hypertension type 2 diabetes the renal disorder hypothyroidism.  Patient denies any chest pain.  Patient still smokes tobacco some days.       Home Medications Prior to Admission medications   Medication Sig Start Date End Date Taking? Authorizing Provider  cephALEXin (KEFLEX) 500 MG capsule Take 1 capsule (500 mg total) by mouth 2 (two) times daily for 7 days. 03/18/22 03/25/22 Yes Fredia Sorrow, MD  sodium zirconium cyclosilicate (LOKELMA) 10 g PACK packet Take 10 g by mouth 2 (two) times daily. 03/18/22  Yes Fredia Sorrow, MD  amLODipine (NORVASC) 10 MG tablet Take 1 tablet (10 mg total) by mouth daily. 12/20/21   Strader, Fransisco Hertz, PA-C  aspirin EC 81 MG tablet Take 1 tablet (81 mg total) by mouth 2 (two) times daily. Hold aspirin while you are on apixaban/Eliquis 04/06/19   Emokpae, Courage, MD  atorvastatin (LIPITOR) 80 MG tablet TAKE 1 TABLET BY MOUTH ONCE DAILY 6 IN THE EVENING 12/20/21   Strader, [place] M, PA-C  BD PEN NEEDLE NANO 2ND GEN 32G X 4 MM MISC Inject into the skin 3 (three) times daily. 07/22/21   [provider]  buPROPion (WELLBUTRIN SR) 150 MG 12 hr tablet Take 1 tablet (150 mg total) by mouth 2 (two) times daily. 09/27/21    [name], [name], MD  carvedilol (COREG) 12.5 MG tablet Take 1 tablet (12.5 mg total) by mouth 2 (two) times daily with a meal. 12/20/21   Strader, [place] M, PA-C  DULoxetine (CYMBALTA) 60 MG capsule Take 1 capsule (60 mg total) by mouth daily. 09/27/21   [name], [name], MD  ferrous sulfate 325 (65 FE) MG tablet Take 325 mg by mouth daily with breakfast.    [provider]  furosemide (LASIX) 40 MG tablet Take 40 mg by mouth daily. Once a day    [provider]  HUMULIN 70/30 KWIKPEN (70-30) 100 UNIT/ML KwikPen Inject 15 units in the morning and if she eats 10 units at lunch and 15 units with supper meal Patient taking differently: Inject 25 units in the morning and if she eats 15units at lunch and 25 units with supper meal 11/22/21   Johnson, Clanford L, MD  levothyroxine (SYNTHROID) 137 MCG tablet Take 150 mcg by mouth daily before breakfast.    [provider]  levothyroxine (SYNTHROID) 150 MCG tablet Take 150 mcg by mouth daily. 01/06/22   [provider]  losartan (COZAAR) 25 MG tablet Take 0.5 tablets (12.5 mg total) by mouth daily. 11/23/21 02/21/22  Johnson, Clanford L, MD  nitroGLYCERIN (NITROSTAT) 0.4 MG SL tablet Place 1 tablet (0.4 mg total) under the tongue every 5 (five) minutes x 3 doses as  needed for chest pain. 03/15/18   Erlene Quan, PA-C  Potassium Chloride ER 20 MEQ TBCR Take by mouth. 20 meq  once a day    [provider]      Allergies    Metformin hcl    Review of Systems   Review of Systems  Constitutional:  Negative for chills and fever.  HENT:  Negative for ear pain and sore throat.   Eyes:  Negative for pain and visual disturbance.  Respiratory:  Negative for cough and shortness of breath.   Cardiovascular:  Negative for chest pain and palpitations.  Gastrointestinal:  Positive for abdominal distention. Negative for abdominal pain and vomiting.  Genitourinary:  Positive for difficulty urinating. Negative for dysuria and  hematuria.  Musculoskeletal:  Negative for arthralgias and back pain.  Skin:  Negative for color change and rash.  Neurological:  Negative for seizures and syncope.  All other systems reviewed and are negative.   Physical Exam Updated Vital Signs BP (!) 153/68   Pulse 70   Temp 98 F (36.7 C) (Oral)   Resp (!) 22   Ht 1.702 m (5\' 7" )   Wt 103.9 kg   SpO2 99%   BMI 35.87 kg/m  Physical Exam Vitals and nursing note reviewed.  Constitutional:      General: She is not in acute distress.    Appearance: Normal appearance. She is well-developed. She is not ill-appearing.  HENT:     Head: Normocephalic and atraumatic.  Eyes:     Conjunctiva/sclera: Conjunctivae normal.     Pupils: Pupils are equal, round, and reactive to light.  Cardiovascular:     Rate and Rhythm: Normal rate and regular rhythm.     Heart sounds: No murmur heard. Pulmonary:     Effort: Pulmonary effort is normal. No respiratory distress.     Breath sounds: Normal breath sounds. No wheezing, rhonchi or rales.  Abdominal:     Palpations: Abdomen is soft.     Tenderness: There is no abdominal tenderness.     Comments: Bladder palpated lower part of the abdomen.  Nontender.  Musculoskeletal:        General: No swelling.     Cervical back: Neck supple.  Skin:    General: Skin is warm and dry.     Capillary Refill: Capillary refill takes less than 2 seconds.  Neurological:     General: No focal deficit present.     Mental Status: She is alert and oriented to person, place, and time.  Psychiatric:        Mood and Affect: Mood normal.     ED Results / Procedures / Treatments   Labs (all labs ordered are listed, but only abnormal results are displayed) Labs Reviewed  CBC WITH DIFFERENTIAL/PLATELET - Abnormal; Notable for the following components:      Result Value   WBC 11.2 (*)    RBC 3.70 (*)    Hemoglobin 10.0 (*)    HCT 32.3 (*)    Neutro Abs 9.4 (*)    All other components within normal limits   BASIC METABOLIC PANEL - Abnormal; Notable for the following components:   Sodium 134 (*)    Potassium 5.2 (*)    CO2 20 (*)    Glucose, Bld 230 (*)    BUN 47 (*)    Creatinine, Ser 3.35 (*)    Calcium 8.7 (*)    GFR, Estimated 15 (*)    All other components within normal  limits  URINALYSIS, ROUTINE W REFLEX MICROSCOPIC - Abnormal; Notable for the following components:   APPearance HAZY (*)    Glucose, UA 150 (*)    Hgb urine dipstick SMALL (*)    Protein, ur >=300 (*)    Leukocytes,Ua TRACE (*)    Bacteria, UA RARE (*)    All other components within normal limits  URINE CULTURE    EKG None  Radiology No results found.  Procedures Procedures    Medications Ordered in ED Medications  sodium zirconium cyclosilicate (LOKELMA) packet 10 g (has no administration in time range)  cephALEXin (KEFLEX) capsule 500 mg (has no administration in time range)    ED Course/ Medical Decision Making/ A&P                           Medical Decision Making Amount and/or Complexity of Data Reviewed Labs: ordered.  Risk Prescription drug management.  Urinalysis suggestive of possible urinary tract infection.  Not a significant amount of urinary retention so we will move the Foley catheter.  Some of this could be due to to some of your renal failure.  Potassium was a little elevated so we have to take Lokelma at home.  This is to help lower your potassium.  Make an appointment follow-up with your doctors for recheck early next week.  Return for any new or worse symptoms return for inability to void.  Scan was done only showed 200 cc we put the Foley catheter in and we got 400 cc out patient is now made a total of 600 cc of urine.  Urinalysis suggestive of urinary tract infection culture sent.  Patient's basic metabolic panel significant for sodium 134 potassium of 5.2 creatinine 3.35 GFR is comparable to where she has been good blood sugar up some at 230.  CBC mild leukocytosis hemoglobin  10.0.  Urinalysis with 21-50 whites and some bacteria.  Sent for culture as stated.  We will treat here with 1 dose of Keflex have her take Keflex for 7 days at a redosed dose due to her renal function every 12 hours.  And have her take, tomorrow 2 doses.  Patient received first dose of Keflex here tonight and first dose of Lokelma here tonight.    Final Clinical Impression(s) / ED Diagnoses Final diagnoses:  Acute urinary retention  Stage 4 chronic kidney disease (HCC)  Hyperkalemia  Acute cystitis without hematuria    Rx / DC Orders ED Discharge Orders          Ordered    cephALEXin (KEFLEX) 500 MG capsule  2 times daily        03/18/22 1738    sodium zirconium cyclosilicate (LOKELMA) 10 g PACK packet  2 times daily        03/18/22 1738              Fredia Sorrow, MD 03/18/22 1754

## 2022-03-18 NOTE — Discharge Instructions (Addendum)
First dose of antibiotic given here first dose of Lokelma given here to help your potassium.  Take the [facility] at home as directed take the antibiotic Keflex as directed.  Urine culture sent.  Make an appointment to follow-up with your doctors to have your kidney function closely followed.  We will remove the Foley catheter if you have trouble with urination then return.

## 2022-03-21 LAB — URINE CULTURE: Culture: >=100000 — AB

## 2022-03-22 ENCOUNTER — Inpatient Hospital Stay (HOSPITAL_COMMUNITY)
Admission: EM | Admit: 2022-03-22 | Discharge: 2022-03-25 | DRG: 389 | Disposition: A | Discharge status: Home / Self Care | Payer: Medicare Other | Attending: Student | Admitting: Student

## 2022-03-22 ENCOUNTER — Emergency Department (HOSPITAL_COMMUNITY): Payer: Medicare Other

## 2022-03-22 ENCOUNTER — Encounter (HOSPITAL_COMMUNITY): Payer: Self-pay | Admitting: Emergency Medicine

## 2022-03-22 ENCOUNTER — Telehealth (HOSPITAL_BASED_OUTPATIENT_CLINIC_OR_DEPARTMENT_OTHER): Payer: Self-pay | Admitting: *Deleted

## 2022-03-22 ENCOUNTER — Other Ambulatory Visit: Payer: Self-pay

## 2022-03-22 ENCOUNTER — Telehealth: Payer: Self-pay | Admitting: Family Medicine

## 2022-03-22 DIAGNOSIS — E8809 Other disorders of plasma-protein metabolism, not elsewhere classified: Secondary | ICD-10-CM | POA: Diagnosis present

## 2022-03-22 DIAGNOSIS — E7800 Pure hypercholesterolemia, unspecified: Secondary | ICD-10-CM | POA: Diagnosis present

## 2022-03-22 DIAGNOSIS — I132 Hypertensive heart and chronic kidney disease with heart failure and with stage 5 chronic kidney disease, or end stage renal disease: Secondary | ICD-10-CM | POA: Diagnosis present

## 2022-03-22 DIAGNOSIS — E039 Hypothyroidism, unspecified: Secondary | ICD-10-CM | POA: Diagnosis present

## 2022-03-22 DIAGNOSIS — E669 Obesity, unspecified: Secondary | ICD-10-CM | POA: Diagnosis present

## 2022-03-22 DIAGNOSIS — N179 Acute kidney failure, unspecified: Secondary | ICD-10-CM | POA: Diagnosis present

## 2022-03-22 DIAGNOSIS — I2510 Atherosclerotic heart disease of native coronary artery without angina pectoris: Secondary | ICD-10-CM | POA: Diagnosis present

## 2022-03-22 DIAGNOSIS — Z8249 Family history of ischemic heart disease and other diseases of the circulatory system: Secondary | ICD-10-CM

## 2022-03-22 DIAGNOSIS — K56699 Other intestinal obstruction unspecified as to partial versus complete obstruction: Secondary | ICD-10-CM | POA: Diagnosis present

## 2022-03-22 DIAGNOSIS — D638 Anemia in other chronic diseases classified elsewhere: Secondary | ICD-10-CM

## 2022-03-22 DIAGNOSIS — Z79890 Hormone replacement therapy (postmenopausal): Secondary | ICD-10-CM

## 2022-03-22 DIAGNOSIS — R410 Disorientation, unspecified: Secondary | ICD-10-CM | POA: Diagnosis present

## 2022-03-22 DIAGNOSIS — Z79899 Other long term (current) drug therapy: Secondary | ICD-10-CM

## 2022-03-22 DIAGNOSIS — E1165 Type 2 diabetes mellitus with hyperglycemia: Secondary | ICD-10-CM | POA: Diagnosis present

## 2022-03-22 DIAGNOSIS — J449 Chronic obstructive pulmonary disease, unspecified: Secondary | ICD-10-CM | POA: Diagnosis present

## 2022-03-22 DIAGNOSIS — E875 Hyperkalemia: Secondary | ICD-10-CM | POA: Diagnosis present

## 2022-03-22 DIAGNOSIS — I5032 Chronic diastolic (congestive) heart failure: Secondary | ICD-10-CM | POA: Diagnosis present

## 2022-03-22 DIAGNOSIS — Z6835 Body mass index (BMI) 35.0-35.9, adult: Secondary | ICD-10-CM

## 2022-03-22 DIAGNOSIS — E860 Dehydration: Secondary | ICD-10-CM

## 2022-03-22 DIAGNOSIS — E871 Hypo-osmolality and hyponatremia: Secondary | ICD-10-CM

## 2022-03-22 DIAGNOSIS — N189 Chronic kidney disease, unspecified: Secondary | ICD-10-CM

## 2022-03-22 DIAGNOSIS — E1122 Type 2 diabetes mellitus with diabetic chronic kidney disease: Secondary | ICD-10-CM | POA: Diagnosis present

## 2022-03-22 DIAGNOSIS — Z87440 Personal history of urinary (tract) infections: Secondary | ICD-10-CM

## 2022-03-22 DIAGNOSIS — N185 Chronic kidney disease, stage 5: Secondary | ICD-10-CM | POA: Diagnosis present

## 2022-03-22 DIAGNOSIS — K56609 Unspecified intestinal obstruction, unspecified as to partial versus complete obstruction: Secondary | ICD-10-CM

## 2022-03-22 DIAGNOSIS — D72829 Elevated white blood cell count, unspecified: Secondary | ICD-10-CM

## 2022-03-22 DIAGNOSIS — Z7982 Long term (current) use of aspirin: Secondary | ICD-10-CM

## 2022-03-22 DIAGNOSIS — I6529 Occlusion and stenosis of unspecified carotid artery: Secondary | ICD-10-CM | POA: Diagnosis present

## 2022-03-22 DIAGNOSIS — Z96651 Presence of right artificial knee joint: Secondary | ICD-10-CM | POA: Diagnosis present

## 2022-03-22 DIAGNOSIS — Z955 Presence of coronary angioplasty implant and graft: Secondary | ICD-10-CM

## 2022-03-22 DIAGNOSIS — Z9861 Coronary angioplasty status: Secondary | ICD-10-CM

## 2022-03-22 DIAGNOSIS — F17210 Nicotine dependence, cigarettes, uncomplicated: Secondary | ICD-10-CM | POA: Diagnosis present

## 2022-03-22 DIAGNOSIS — E872 Acidosis, unspecified: Secondary | ICD-10-CM | POA: Diagnosis present

## 2022-03-22 DIAGNOSIS — Z90710 Acquired absence of both cervix and uterus: Secondary | ICD-10-CM

## 2022-03-22 DIAGNOSIS — D631 Anemia in chronic kidney disease: Secondary | ICD-10-CM | POA: Diagnosis present

## 2022-03-22 DIAGNOSIS — Z794 Long term (current) use of insulin: Secondary | ICD-10-CM | POA: Diagnosis not present

## 2022-03-22 DIAGNOSIS — Z823 Family history of stroke: Secondary | ICD-10-CM

## 2022-03-22 DIAGNOSIS — Z8673 Personal history of transient ischemic attack (TIA), and cerebral infarction without residual deficits: Secondary | ICD-10-CM

## 2022-03-22 DIAGNOSIS — Z888 Allergy status to other drugs, medicaments and biological substances status: Secondary | ICD-10-CM

## 2022-03-22 LAB — URINALYSIS, ROUTINE W REFLEX MICROSCOPIC
Bilirubin Urine: NEGATIVE
Glucose, UA: 50 mg/dL — AB
Hgb urine dipstick: NEGATIVE
Ketones, ur: 5 mg/dL — AB
Leukocytes,Ua: NEGATIVE
Nitrite: NEGATIVE
Protein, ur: >=300 mg/dL — AB
Specific Gravity, Urine: 1.013 (ref 1.005–1.030)
pH: 5 (ref 5.0–8.0)

## 2022-03-22 LAB — CBC WITH DIFFERENTIAL/PLATELET
Abs Immature Granulocytes: 0.07 10*3/uL (ref 0.00–0.07)
Basophils Absolute: 0.1 10*3/uL (ref 0.0–0.1)
Basophils Relative: 0 %
Eosinophils Absolute: 0.1 10*3/uL (ref 0.0–0.5)
Eosinophils Relative: 1 %
HCT: 28.6 % — ABNORMAL LOW (ref 36.0–46.0)
Hemoglobin: 9.5 g/dL — ABNORMAL LOW (ref 12.0–15.0)
Immature Granulocytes: 1 %
Lymphocytes Relative: 3 %
Lymphs Abs: 0.5 10*3/uL — ABNORMAL LOW (ref 0.7–4.0)
MCH: 27.5 pg (ref 26.0–34.0)
MCHC: 33.2 g/dL (ref 30.0–36.0)
MCV: 82.9 fL (ref 80.0–100.0)
Monocytes Absolute: 1.3 10*3/uL — ABNORMAL HIGH (ref 0.1–1.0)
Monocytes Relative: 9 %
Neutro Abs: 12.6 10*3/uL — ABNORMAL HIGH (ref 1.7–7.7)
Neutrophils Relative %: 86 %
Platelets: 357 10*3/uL (ref 150–400)
RBC: 3.45 MIL/uL — ABNORMAL LOW (ref 3.87–5.11)
RDW: 13.6 % (ref 11.5–15.5)
WBC: 14.6 10*3/uL — ABNORMAL HIGH (ref 4.0–10.5)
nRBC: 0 % (ref 0.0–0.2)

## 2022-03-22 LAB — COMPREHENSIVE METABOLIC PANEL
ALT: 15 U/L (ref 0–44)
AST: 12 U/L — ABNORMAL LOW (ref 15–41)
Albumin: 2.6 g/dL — ABNORMAL LOW (ref 3.5–5.0)
Alkaline Phosphatase: 71 U/L (ref 38–126)
Anion gap: 13 (ref 5–15)
BUN: 89 mg/dL — ABNORMAL HIGH (ref 8–23)
CO2: 21 mmol/L — ABNORMAL LOW (ref 22–32)
Calcium: 8 mg/dL — ABNORMAL LOW (ref 8.9–10.3)
Chloride: 98 mmol/L (ref 98–111)
Creatinine, Ser: 4.41 mg/dL — ABNORMAL HIGH (ref 0.44–1.00)
GFR, Estimated: 10 mL/min — ABNORMAL LOW (ref >60)
Glucose, Bld: 206 mg/dL — ABNORMAL HIGH (ref 70–99)
Potassium: 5.3 mmol/L — ABNORMAL HIGH (ref 3.5–5.1)
Sodium: 132 mmol/L — ABNORMAL LOW (ref 135–145)
Total Bilirubin: 0.8 mg/dL (ref 0.3–1.2)
Total Protein: 5.8 g/dL — ABNORMAL LOW (ref 6.5–8.1)

## 2022-03-22 LAB — BLOOD GAS, VENOUS
Acid-base deficit: 2.5 mmol/L — ABNORMAL HIGH (ref 0.0–2.0)
Bicarbonate: 23.2 mmol/L (ref 20.0–28.0)
Drawn by: 66297
O2 Saturation: 36.9 %
Patient temperature: 36.8
pCO2, Ven: 43 mmHg — ABNORMAL LOW (ref 44–60)
pH, Ven: 7.34 (ref 7.25–7.43)
pO2, Ven: <31 mmHg — CL (ref 32–45)

## 2022-03-22 LAB — TYPE AND SCREEN
ABO/RH(D): A POS
Antibody Screen: NEGATIVE

## 2022-03-22 MED ORDER — CALCIUM GLUCONATE-NACL 1-0.675 GM/50ML-% IV SOLN
1.0000 g | Freq: Once | INTRAVENOUS | Status: AC
  Administered 2022-03-22: 1000 mg via INTRAVENOUS
  Filled 2022-03-22: qty 50

## 2022-03-22 MED ORDER — SODIUM CHLORIDE 0.9 % IV BOLUS
500.0000 mL | Freq: Once | INTRAVENOUS | Status: AC
  Administered 2022-03-22: 500 mL via INTRAVENOUS

## 2022-03-22 MED ORDER — ONDANSETRON HCL 4 MG/2ML IJ SOLN
4.0000 mg | Freq: Once | INTRAMUSCULAR | Status: AC
  Administered 2022-03-22: 4 mg via INTRAVENOUS
  Filled 2022-03-22: qty 2

## 2022-03-22 MED ORDER — PANTOPRAZOLE SODIUM 40 MG IV SOLR
40.0000 mg | Freq: Once | INTRAVENOUS | Status: AC
Start: 2022-03-22 — End: 2022-03-22
  Administered 2022-03-22: 40 mg via INTRAVENOUS
  Filled 2022-03-22: qty 10

## 2022-03-22 NOTE — Telephone Encounter (Signed)
Post ED Visit - Positive Culture Follow-up  Culture report reviewed by antimicrobial stewardship pharmacist: [place] Team []  Elenor Quinones, Pharm.D. []  Heide Guile, Pharm.D., BCPS AQ-ID []  Parks Neptune, Pharm.D., BCPS []  Alycia Rossetti, Pharm.D., BCPS []  [place], Pharm.D., BCPS, AAHIVP []  Legrand Como, Pharm.D., BCPS, AAHIVP []  Salome Arnt, PharmD, BCPS []  Johnnette Gourd, PharmD, BCPS []  Hughes Better, PharmD, BCPS []  Leeroy Cha, PharmD []  Laqueta Linden, PharmD, BCPS []  Albertina Parr, PharmD  [place] Team []  Leodis Sias, PharmD []  Lindell Spar, PharmD []  Royetta Asal, PharmD []  Graylin Shiver, Rph []  Rema Fendt) Glennon Mac, PharmD []  Arlyn Dunning, PharmD []  Netta Cedars, PharmD []  Dia Sitter, PharmD []  Leone Haven, PharmD []  Gretta Arab, PharmD []  Theodis Shove, PharmD []  Peggyann Juba, PharmD []  Reuel Boom, PharmD   Positive urine culture Treated with Ce[ja;exom, organism sensitive to the same and no further patient follow-up is required at this time.  Louanne Belton, Pharm D  Harlon Flor Talley 03/22/2022, 8:27 AM

## 2022-03-22 NOTE — H&P (Incomplete)
History and Physical    Patient: Jennifer [name] VVO:[id number] DOB: [birth year] DOA: 03/22/2022 DOS: the patient was seen and examined on 03/23/2022 PCP: Abner Greenspan, MD  Patient coming from: Home  Chief Complaint:  Chief Complaint  Patient presents with   Nausea   Emesis   Fatigue   HPI: Jennifer [name] is a 66 y.o. 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  Patient presented to the emergency department about 4 days ago (10/6) due to urinary retention and painful urination.  She was treated and prescribed with antibiotics, however, since Saturday (10/7) she has had difficulty in being able to keep any food or fluid down.  Patient also presents with vomiting, and today the vomiting was coffee-ground color which resulted in EMS being activated and patient was taken to the ED for further evaluation and management.  Patient denies chest pain, shortness of breath, fever, chills, diarrhea or constipation.  Last bowel movement was 4 days ago.  ED Course:  In the emergency department, she was hemodynamically stable, BP was 156/80 and other vital signs are within normal range.  Work-up in the ED showed leukocytosis and normocytic anemia, BMP showed sodium 132, potassium 5.3, chloride 98, bicarb 21, glucose 206, BUN/creatinine 8 9/4.41, albumin 2.6, urinalysis was unimpressive for UTI. CT abdomen and pelvis without contrast showed: 1. Small bowel obstruction with transition point in the right lower quadrant. 2. Colonic diverticulosis without focal diverticulitis. 3. Punctate nonobstructing left renal stone. Calcium gluconate was given, Zofran, Protonix and IV hydration was provided. General surgery (Dr. Constance Haw) was consulted and recommended admitting patient with plan to see patient in the morning.  Hospitalist was asked to admit  patient for further evaluation and management  Review of Systems: Review of systems as noted in the HPI. All other systems reviewed and are negative.   Past Medical History:  Diagnosis Date   CAD (coronary artery disease)    cath 5/23 100% dist RCA lesion treated with 2 overlapping Integrity Resolute DES ( 2.25x93mm, 2.25x3mm), 60% mid RCA treated medically, 99% OM2 not amenable to PCI, 70% D1 lesion, EF normal   Hypercholesteremia    Hypertension    Hypothyroidism    Kidney disease    Renal disorder    Stroke ([place]) 10/2018   Thyroid disease    Type 2 diabetes mellitus ([place]) 10/08/2018   Past Surgical History:  Procedure Laterality Date   ABDOMINAL HYSTERECTOMY     CARDIAC CATHETERIZATION N/A 11/03/2015   Procedure: Left Heart Cath and Coronary Angiography;  Surgeon: Leonie Man, MD;  Location: [facility];  Service: Cardiovascular;  Laterality: N/A;   CARDIAC CATHETERIZATION N/A 11/03/2015   Procedure: Coronary Stent Intervention;  Surgeon: Leonie Man, MD;  Location: [facility];  Service: Cardiovascular;  Laterality: N/A;   CARPAL TUNNEL RELEASE     CORONARY ANGIOPLASTY     1 stent   TOTAL KNEE ARTHROPLASTY Right 04/02/2019   Procedure: TOTAL KNEE ARTHROPLASTY;  Surgeon: Carole Civil, MD;  Location: [facility];  Service: Orthopedics;  Laterality: Right;    Social History:  reports that she has been smoking cigarettes. She has been smoking an average of .25 packs per day. She has never used smokeless tobacco. She reports that she does not drink alcohol and does not use drugs.   Allergies  Allergen Reactions  Metformin Hcl Other (See Comments)    Family History  Problem Relation Age of Onset   Heart disease Mother    Stroke Sister    Heart attack Brother    Breast cancer Neg Hx      Prior to Admission medications   Medication Sig Start Date End Date Taking? Authorizing Provider  amLODipine (NORVASC) 10 MG tablet Take 1 tablet (10 mg total)  by mouth daily. 12/20/21   Strader, Fransisco Hertz, PA-C  aspirin EC 81 MG tablet Take 1 tablet (81 mg total) by mouth 2 (two) times daily. Hold aspirin while you are on apixaban/Eliquis 04/06/19   Emokpae, Courage, MD  atorvastatin (LIPITOR) 80 MG tablet TAKE 1 TABLET BY MOUTH ONCE DAILY 6 IN THE EVENING 12/20/21   Strader, [place] M, PA-C  BD PEN NEEDLE NANO 2ND GEN 32G X 4 MM MISC Inject into the skin 3 (three) times daily. 07/22/21   [provider]  buPROPion (WELLBUTRIN SR) 150 MG 12 hr tablet Take 1 tablet (150 mg total) by mouth 2 (two) times daily. 09/27/21   [name], [name], MD  carvedilol (COREG) 12.5 MG tablet Take 1 tablet (12.5 mg total) by mouth 2 (two) times daily with a meal. 12/20/21   Strader, [place] M, PA-C  cephALEXin (KEFLEX) 500 MG capsule Take 1 capsule (500 mg total) by mouth 2 (two) times daily for 7 days. 03/18/22 03/25/22  Fredia Sorrow, MD  DULoxetine (CYMBALTA) 60 MG capsule Take 1 capsule (60 mg total) by mouth daily. 09/27/21   [name], [name], MD  ferrous sulfate 325 (65 FE) MG tablet Take 325 mg by mouth daily with breakfast.    [provider]  furosemide (LASIX) 40 MG tablet Take 40 mg by mouth daily. Once a day    [provider]  HUMULIN 70/30 KWIKPEN (70-30) 100 UNIT/ML KwikPen Inject 15 units in the morning and if she eats 10 units at lunch and 15 units with supper meal Patient taking differently: Inject 25 units in the morning and if she eats 15units at lunch and 25 units with supper meal 11/22/21   Johnson, Clanford L, MD  levothyroxine (SYNTHROID) 137 MCG tablet Take 150 mcg by mouth daily before breakfast.    [provider]  levothyroxine (SYNTHROID) 150 MCG tablet Take 150 mcg by mouth daily. 01/06/22   [provider]  losartan (COZAAR) 25 MG tablet Take 0.5 tablets (12.5 mg total) by mouth daily. 11/23/21 02/21/22  Johnson, Clanford L, MD  nitroGLYCERIN (NITROSTAT) 0.4 MG SL tablet Place 1 tablet (0.4 mg total) under the  tongue every 5 (five) minutes x 3 doses as needed for chest pain. 03/15/18   Erlene Quan, PA-C  Potassium Chloride ER 20 MEQ TBCR Take by mouth. 20 meq  once a day    [provider]  sodium zirconium cyclosilicate (LOKELMA) 10 g PACK packet Take 10 g by mouth 2 (two) times daily. 03/18/22   Fredia Sorrow, MD    Physical Exam: BP (!) 119/54 (BP Location: Right Arm)   Pulse 91   Temp 98.1 F (36.7 C)   Resp 17   Ht 5\' 7"  (1.702 m)   Wt 100.3 kg   SpO2 96%   BMI 34.63 kg/m   General: 66 y.o. year-old female ill appearing, but in no acute distress.  Alert and oriented x3. HEENT: NCAT, EOMI, dry mucous membrane Neck: Supple, trachea medial Cardiovascular: Regular rate and rhythm with no rubs or gallops.  No thyromegaly  or JVD noted.  No lower extremity edema. 2/4 pulses in all 4 extremities. Respiratory: Clear to auscultation with no wheezes or rales. Good inspiratory effort. Abdomen: Soft, tender to palpation in the hypogastric region without guarding..  Normal bowel sounds x4 quadrants. Muskuloskeletal: No cyanosis, clubbing or edema noted bilaterally Neuro: CN II-XII intact, strength 5/5 x 4, sensation, reflexes intact Skin: No ulcerative lesions noted or rashes Psychiatry: Judgement and insight appear normal. Mood is appropriate for condition and setting          Labs on Admission:  Basic Metabolic Panel: Recent Labs  Lab 03/18/22 1542 03/22/22 1528  NA 134* 132*  K 5.2* 5.3*  CL 107 98  CO2 20* 21*  GLUCOSE 230* 206*  BUN 47* 89*  CREATININE 3.35* 4.41*  CALCIUM 8.7* 8.0*   Liver Function Tests: Recent Labs  Lab 03/22/22 1528  AST 12*  ALT 15  ALKPHOS 71  BILITOT 0.8  PROT 5.8*  ALBUMIN 2.6*   No results for input(s): "LIPASE", "AMYLASE" in the last 168 hours. No results for input(s): "AMMONIA" in the last 168 hours. CBC: Recent Labs  Lab 03/18/22 1542 03/22/22 1528  WBC 11.2* 14.6*  NEUTROABS 9.4* 12.6*  HGB 10.0* 9.5*  HCT 32.3* 28.6*   MCV 87.3 82.9  PLT 311 357   Cardiac Enzymes: No results for input(s): "CKTOTAL", "CKMB", "CKMBINDEX", "TROPONINI" in the last 168 hours.  BNP (last 3 results) No results for input(s): "BNP" in the last 8760 hours.  ProBNP (last 3 results) No results for input(s): "PROBNP" in the last 8760 hours.  CBG: No results for input(s): "GLUCAP" in the last 168 hours.  Radiological Exams on Admission: DG Abd 1 View  Result Date: 03/23/2022 CLINICAL DATA:  Nasogastric tube placement EXAM: ABDOMEN - 1 VIEW COMPARISON:  03/22/2022 FINDINGS: The nasogastric tube is seen within the a proximal body of the stomach. Multiple dilated loops of small bowel are seen within the upper abdomen in keeping with a underlying small-bowel obstruction. No gross free intraperitoneal gas. Cholecystectomy clips seen in the right upper quadrant. IMPRESSION: Nasogastric tube within the proximal body of the stomach. Electronically Signed   By: Fidela Salisbury M.D.   On: 03/23/2022 01:49   DG Abdomen 1 View  Result Date: 03/22/2022 CLINICAL DATA:  NG tube placement. EXAM: ABDOMEN - 1 VIEW COMPARISON:  11/19/2021. FINDINGS: Examination is limited due to field of view. The bowel gas pattern is normal. An enteric tube terminates in the stomach. No definite renal calculi. Cholecystectomy clips are noted in the right upper quadrant. IMPRESSION: An enteric tube terminates in the stomach. Electronically Signed   By: Brett Fairy M.D.   On: 03/22/2022 20:53   CT Abdomen Pelvis Wo Contrast  Result Date: 03/22/2022 CLINICAL DATA:  Nausea and vomiting. Weakness and fatigue. EXAM: CT ABDOMEN AND PELVIS WITHOUT CONTRAST TECHNIQUE: Multidetector CT imaging of the abdomen and pelvis was performed following the standard protocol without IV contrast. RADIATION DOSE REDUCTION: This exam was performed according to the departmental dose-optimization program which includes automated exposure control, adjustment of the mA and/or kV according to  patient size and/or use of iterative reconstruction technique. COMPARISON:  Most recent CT 10/08/2018 FINDINGS: Lower chest: Subsegmental atelectasis in the right lower lobe. Trace pericardial effusion. Hepatobiliary: No evidence of focal liver abnormality on this unenhanced exam. Clips in the gallbladder fossa postcholecystectomy. No biliary dilatation. Pancreas: Parenchymal atrophy. No ductal dilatation or inflammation. Spleen: Normal in size. The previous low-density lesion in the spleen is  not well characterized on this unenhanced exam. Adrenals/Urinary Tract: No adrenal nodule. Extrarenal pelvis configuration of the left kidney. There is a punctate nonobstructing stone in the mid left kidney. No hydronephrosis nor perinephric edema. No ureteral stones. Partially distended urinary bladder, no wall thickening. Stomach/Bowel: Dilated fluid-filled proximal small bowel, dimension measuring up to 5.1 cm. A discrete transition point is not well-defined, however suspected in the right lower quadrant, is the distal right lower quadrant small bowel loops are decompressed. Mild mesenteric edema. No pneumatosis or definite bowel wall thickening. The appendix is normal. Multifocal colonic diverticulosis without focal diverticulitis. Small volume of stool in the colon. Vascular/Lymphatic: Aortic and branch atherosclerosis. No aneurysm. No portal venous or mesenteric gas. No bulky adenopathy. Reproductive: Hysterectomy. No adnexal mass. Other: No free air. No ascites. Postsurgical change of the anterior abdominal wall. No abdominal wall hernia. Musculoskeletal: Degenerative change in the spine, both hips and pubic symphysis. There are no acute or suspicious osseous abnormalities. IMPRESSION: 1. Small bowel obstruction with transition point in the right lower quadrant. 2. Colonic diverticulosis without focal diverticulitis. 3. Punctate nonobstructing left renal stone. Aortic Atherosclerosis (ICD10-I70.0). Electronically Signed    By: Keith Rake M.D.   On: 03/22/2022 18:26    EKG: I independently viewed the EKG done and my findings are as followed: Normal sinus rhythm at rate of 84 bpm  Assessment/Plan Present on Admission:  COPD (chronic obstructive pulmonary disease) (HCC)  Principal Problem:   SBO (small bowel obstruction) ([place]) Active Problems:   Obesity (BMI 30-39.9)   COPD (chronic obstructive pulmonary disease) (HCC)   Acquired hypothyroidism   Leukocytosis   CAD S/P percutaneous coronary angioplasty   Uncontrolled type 2 diabetes mellitus with hyperglycemia, with long-term current use of insulin (HCC)   Acute kidney injury superimposed on chronic kidney disease (HCC)   Anemia of chronic disease   Hyponatremia   Hyperkalemia   Dehydration   Chronic diastolic CHF (congestive heart failure) (HCC)  Small bowel obstruction Continue NG tube  Continue NPO at this time  Continue IV hydration Continue IV Dilaudid 0.5 mg q.4h p.r.n. for moderate/severe pain Continue Zofran p.r.n. for nausea/vomiting General surgery was consulted by ED physician and will follow-up with patient in the morning  Leukocytosis possibly reactive WBC 14.6, continue to monitor WBC with morning labs  Normocytic anemia Hemoglobin at 9.5, baseline range in the 10s Coffee-ground emesis was reported by daughter Gastric occult blood test pending  Hyponatremia possibly secondary to dehydration Continue IV hydration  Hyperkalemia K+ 5.3; calcium gluconate to stabilize the heart was given No acute EKG changes Continue IV hydration and recheck potassium in the morning  Diabetes mellitus type 2, insulin-dependent Hemoglobin A1c was 8.6 on 11/19/2021 Continue ISS and hypoglycemia protocol  Hypoalbuminemia possibly secondary to moderate protein calorie malnutrition Albumin 2.6; consider providing protein supplement when patient resumes oral intake  Acute kidney injury on CKD 5 BUN/creatinine 8 9/4.41 (baseline creatinine  at 2.7-3.4) Patient still forms urine Continue gentle hydration Renally adjust medications, avoid nephrotoxic agents/dehydration/hypotension  Nausea and vomiting Continue Zofran as needed  COPD No active wheezing, continue with nebs as needed.  Essential hypertension Continue amlodipine, Coreg when patient resumes oral intake   History of CAD status post stent placement Continue with aspirin, Coreg and Lipitor when patient resumes oral intake   PAD/Carotid stenosis/CVA Continue with aspirin and statin when patient resumes oral intake   Hypothyroidism Continue with Synthroid when patient resumes oral intake  Obesity (BMI 34.63) Diet and lifestyle modification  Chronic diastolic CHF  Echo from April 2020 with EF of 55 to 60% with first-degree diastolic dysfunction,  Patient appears to be clinically dry, continue IV hydration  DVT prophylaxis: Heparin subcu  Code Status: Full code  Consults: General surgery  Family Communication: Daughter at bedside (all questions answered to satisfaction)  Severity of Illness: The appropriate patient status for this patient is INPATIENT. Inpatient status is judged to be reasonable and necessary in order to provide the required intensity of service to ensure the patient's safety. The patient's presenting symptoms, physical exam findings, and initial radiographic and laboratory data in the context of their chronic comorbidities is felt to place them at high risk for further clinical deterioration. Furthermore, it is not anticipated that the patient will be medically stable for discharge from the hospital within 2 midnights of admission.   * I certify that at the point of admission it is my clinical judgment that the patient will require inpatient hospital care spanning beyond 2 midnights from the point of admission due to high intensity of service, high risk for further deterioration and high frequency of surveillance  required.*  Author: Bernadette Hoit, DO 03/23/2022 2:21 AM  For on call review [email or web address].

## 2022-03-22 NOTE — ED Triage Notes (Signed)
Pt was here on Friday, 9/6 for dysuria. ED did a foley to drain bladder, gave abx and discharged pt. Since then, pt has had severe nausea and vomiting with weakness and fatigue. Daughter reports a vomit with a coffee ground appearance

## 2022-03-22 NOTE — Telephone Encounter (Signed)
I spoke with Barnett Applebaum (DPR signed) and EMS is on the way to pt now; Barnett Applebaum could not get pt up and to the car to go to ED. Barnett Applebaum said pt is vomiting a lot of coffee ground vomitus. Barnett Applebaum wants to keep appt with Dr Darnell Level already scheduled for 03/23/22 and Barnett Applebaum will cancel depending on what occurs at [facility] ED today. Sending note to Dr Glori Bickers and DR Maryan Puls CMA.

## 2022-03-22 NOTE — Telephone Encounter (Signed)
Noted. Agree with ER evaluation today.

## 2022-03-22 NOTE — Telephone Encounter (Signed)
Patient daughter Jennifer [name] called in stating that Jennifer [name] has been vomiting brown looking stuff (not sure if its blood), feeling weak, and in stage 5 kidney failure. Sent over to access nurse.

## 2022-03-22 NOTE — ED Provider Notes (Signed)
[facility] EMERGENCY DEPARTMENT Provider Note   CSN: [id number] Arrival date & time: 03/22/22  1421     History  Chief Complaint  Patient presents with   Nausea   Emesis   Fatigue    Jennifer [name] is a 66 y.o. female brought in by EMS for nausea, vomiting, with weakness and fatigue.  Daughter is present in the room with patient.  She states she has concerns about her mother and reports an episode of vomiting that had a coffee-ground appearance.  Patient was seen in the ED on Friday 10/6 for urinary retention and dysuria.  She was given antibiotics at this time.  Patient has been unable to keep down solids, liquids, and medication.  Daughter also reports patient is not mentating at her baseline, answers slower than usual, and her skin appears yellow.  Denies fever, chills, shortness of breath, chest pain, diarrhea, constipation, melena, hematochezia.  Last bowel movement was 4 days ago.  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        Home Medications Prior to Admission medications   Medication Sig Start Date End Date Taking? Authorizing Provider  amLODipine (NORVASC) 10 MG tablet Take 1 tablet (10 mg total) by mouth daily. 12/20/21   Strader, Fransisco Hertz, PA-C  aspirin EC 81 MG tablet Take 1 tablet (81 mg total) by mouth 2 (two) times daily. Hold aspirin while you are on apixaban/Eliquis 04/06/19   Emokpae, Courage, MD  atorvastatin (LIPITOR) 80 MG tablet TAKE 1 TABLET BY MOUTH ONCE DAILY 6 IN THE EVENING 12/20/21   Strader, [place] M, PA-C  BD PEN NEEDLE NANO 2ND GEN 32G X 4 MM MISC Inject into the skin 3 (three) times daily. 07/22/21   [provider]  buPROPion (WELLBUTRIN SR) 150 MG 12 hr tablet Take 1 tablet (150 mg total) by mouth 2 (two) times daily. 09/27/21   [name], [name], MD  carvedilol (COREG) 12.5 MG tablet Take 1 tablet (12.5 mg total) by mouth 2 (two) times daily with a meal. 12/20/21   Strader, [place] M, PA-C  cephALEXin (KEFLEX) 500 MG  capsule Take 1 capsule (500 mg total) by mouth 2 (two) times daily for 7 days. 03/18/22 03/25/22  Fredia Sorrow, MD  DULoxetine (CYMBALTA) 60 MG capsule Take 1 capsule (60 mg total) by mouth daily. 09/27/21   [name], [name], MD  ferrous sulfate 325 (65 FE) MG tablet Take 325 mg by mouth daily with breakfast.    [provider]  furosemide (LASIX) 40 MG tablet Take 40 mg by mouth daily. Once a day    [provider]  HUMULIN 70/30 KWIKPEN (70-30) 100 UNIT/ML KwikPen Inject 15 units in the morning and if she eats 10 units at lunch and 15 units with supper meal Patient taking differently: Inject 25 units in the morning and if she eats 15units at lunch and 25 units with supper meal 11/22/21   Johnson, Clanford L, MD  levothyroxine (SYNTHROID) 137 MCG tablet Take 150 mcg by mouth daily before breakfast.    [provider]  levothyroxine (SYNTHROID) 150 MCG tablet Take 150 mcg by mouth daily. 01/06/22   [provider]  losartan (COZAAR) 25 MG tablet Take 0.5 tablets (12.5 mg total) by mouth daily. 11/23/21 02/21/22  Johnson, Clanford L, MD  nitroGLYCERIN (NITROSTAT) 0.4 MG SL tablet Place 1 tablet (0.4 mg total) under the tongue every 5 (five) minutes x 3 doses as needed for chest pain. 03/15/18  Erlene Quan, PA-C  Potassium Chloride ER 20 MEQ TBCR Take by mouth. 20 meq  once a day    [provider]  sodium zirconium cyclosilicate (LOKELMA) 10 g PACK packet Take 10 g by mouth 2 (two) times daily. 03/18/22   Fredia Sorrow, MD      Allergies    Metformin hcl    Review of Systems   Review of Systems  Constitutional:  Positive for fatigue. Negative for chills and fever.  Respiratory:  Negative for shortness of breath.   Cardiovascular:  Positive for leg swelling. Negative for chest pain.  Gastrointestinal:  Positive for abdominal pain, nausea and vomiting. Negative for constipation and diarrhea.  Genitourinary:  Positive for decreased urine volume.   Skin:  Positive for color change.  Psychiatric/Behavioral:  Positive for confusion.     Physical Exam Updated Vital Signs BP (!) 113/98   Pulse 84   Temp 98.3 F (36.8 C) (Oral)   Resp 20   Ht 5\' 7"  (1.702 m)   Wt 103.9 kg   SpO2 97%   BMI 35.88 kg/m  Physical Exam Vitals and nursing note reviewed.  Constitutional:      General: She is awake. She is not in acute distress.    Appearance: She is obese. She is ill-appearing. She is not toxic-appearing.     Comments: Awake, but not engaged. Has blank stare.   HENT:     Mouth/Throat:     Mouth: Mucous membranes are dry.     Pharynx: Oropharynx is clear.  Eyes:     Conjunctiva/sclera: Conjunctivae normal.     Pupils: Pupils are equal, round, and reactive to light.  Cardiovascular:     Rate and Rhythm: Normal rate and regular rhythm.     Pulses: Normal pulses.     Heart sounds: Normal heart sounds.  Pulmonary:     Effort: Pulmonary effort is normal. No respiratory distress.     Breath sounds: Normal breath sounds and air entry.  Abdominal:     General: Abdomen is flat. Bowel sounds are normal. There is no distension.     Palpations: Abdomen is soft.     Tenderness: There is abdominal tenderness in the suprapubic area.  Musculoskeletal:     Right lower leg: 4+ Edema present.     Left lower leg: 3+ Edema present.  Skin:    General: Skin is warm and dry.     Capillary Refill: Capillary refill takes less than 2 seconds.     Coloration: Skin is jaundiced.  Neurological:     General: No focal deficit present.     Mental Status: She is oriented to person, place, and time. She is lethargic.     Motor: Weakness present.     Comments: She is oriented and able to answer all questions that are directed at her.  Otherwise, patient does not engage in conversation.  Bilateral weakness of lower extremities.  No focal deficit.  Psychiatric:        Mood and Affect: Mood normal.        Speech: Speech normal.        Behavior: Behavior  is slowed. Behavior is cooperative.     ED Results / Procedures / Treatments   Labs (all labs ordered are listed, but only abnormal results are displayed) Labs Reviewed  COMPREHENSIVE METABOLIC PANEL - Abnormal; Notable for the following components:      Result Value   Sodium 132 (*)    Potassium  5.3 (*)    CO2 21 (*)    Glucose, Bld 206 (*)    BUN 89 (*)    Creatinine, Ser 4.41 (*)    Calcium 8.0 (*)    Total Protein 5.8 (*)    Albumin 2.6 (*)    AST 12 (*)    GFR, Estimated 10 (*)    All other components within normal limits  CBC WITH DIFFERENTIAL/PLATELET - Abnormal; Notable for the following components:   WBC 14.6 (*)    RBC 3.45 (*)    Hemoglobin 9.5 (*)    HCT 28.6 (*)    Neutro Abs 12.6 (*)    Lymphs Abs 0.5 (*)    Monocytes Absolute 1.3 (*)    All other components within normal limits  URINALYSIS, ROUTINE W REFLEX MICROSCOPIC  BLOOD GAS, VENOUS    EKG None  Radiology No results found.  Procedures Procedures    Medications Ordered in ED Medications  sodium chloride 0.9 % bolus 500 mL (has no administration in time range)  ondansetron (ZOFRAN) injection 4 mg (has no administration in time range)  pantoprazole (PROTONIX) injection 40 mg (has no administration in time range)  calcium gluconate 1 g/ 50 mL sodium chloride IVPB (has no administration in time range)    ED Course/ Medical Decision Making/ A&P                           Medical Decision Making Amount and/or Complexity of Data Reviewed Labs: ordered. Radiology: ordered. ECG/medicine tests: ordered.  Risk Prescription drug management. Decision regarding hospitalization.   Patient presents with nausea, vomiting, generalized weakness, and concerns of not mentating at her baseline.  Differential includes but is not limited to AKI, uremia, sepsis, bacteremia, infectious etiology, anemia, UTI, hepatitis.  Physical exam not particularly revealing of etiology.  Patient had mild suprapubic  tenderness on palpation and generalized weakness of lower extremities.  She was seen in ED on 10/6 for acute urinary retention and treatment of UTI.  No focal deficit.  She was able to answer all questions appropriately, but did appear to have a blank stare and not engage in conversation with provider or daughter unless directly asked a question.  She is afebrile.  She does not currently meet sepsis or SIRS criteria.  Independent history from chart review.  Patient daughter is also at bedside.  Her condition has been worsening per daughter.  She has had several ED visits in the past few months.  She is followed by nephrology for CKD but is not on dialysis.  I ordered and viewed laboratory work-up. CBC with leukocytosis, WBC 14.6; anemia, Hgb 9.5.  CMP with hyponatremia, hyperkalemia, hypocalcemia, elevated blood glucose, creatinine 4.41, BUN 89, GFR 10.  Creatinine has worsened since her visit on 10/6. Venous blood gas with pH 7.34, PCO2 43, PO2 <31 demonstrating mild acidosis. UA was hazy, has proteinuria, glucose, and rare bacteria.  I ordered and reviewed CT abdomen and pelvis without contrast.  Small bowel obstruction present.  I agree with radiologist interpretation. I ordered NS fluids, calcium gluconate, Zofran, and Protonix. ECG shows sinus rhythm without ectopy, evidence of myocardial ischemia or infarction.  Discussed work-up with patient and her daughter who is in the room.  Plan at this time is to admit patient for additional work-up and management due to patient's worsening condition at home.  Discussed HPI, physical exam and plan of care for this patient with attending Ankit Nanavati.  The attending physician evaluated this patient as part of a shared visit and agrees with plan of care.  Upon reassessment of patient to discuss NG tube, she appeared more alert and engaged in conversation.  Auscultation of all lung fields remain clear after fluid bolus.  NG tube was ordered and placed  with abdominal x-ray to confirm placement.  Dr. Constance Haw with general surgery and hospitalist, Dr. Josephine Cables, were consulted and recommended admitting patient.  Plan to admit patient for further treatment/management of small bowel obstruction.  Patient and daughter at bedside agree with plan to admit.        Final Clinical Impression(s) / ED Diagnoses Final diagnoses:  None    Rx / DC Orders ED Discharge Orders     None         Pat Kocher, [place] 03/22/22 2116    Varney Biles, MD 03/22/22 [phone]

## 2022-03-22 NOTE — ED Notes (Signed)
Walked into pt's room and she was actively throwing up and had thrown up on herself. Another nurse tech helped me clean her up. It looked like coffee ground emesis.

## 2022-03-22 NOTE — Telephone Encounter (Signed)
[facility] - Client TELEPHONE ADVICE RECORD [facility] Patient Name: Jennifer [name] Gender: Female DOB: 01-30-1956 Age: 66 Y 10 M 24 D Return Phone Number: [phone] (Primary) Address: [place] [address] Client [facility] - Client Client Site [place] Provider Glori Bickers, Roque Lias - MD Contact Type Call Who Is Calling Patient / Member / Family / Caregiver Call Type Triage / Clinical Caller Name Barnett Applebaum Relationship To Patient Daughter Return Phone Number [phone] (Primary) Chief Complaint VOMITING - Blood Reason for Call Symptomatic / Request for [place] states that her mother has been vomiting and she is not sure if it is blood. She has been weak and has stage 5 kidney failure. Translation No Nurse Assessment Nurse: Toribio Harbour, RN, Joelene Millin Date/Time (Eastern Time): 03/22/2022 12:59:45 PM Confirm and document reason for call. If symptomatic, describe symptoms. ---Caller states that her mother has been vomiting and she is not sure if it is blood. She has been weak and has stage 5 kidney failure. She is on antibiotic for a UTI. She is not eating and barely drinking. She states she is vomiting a dark brown color but not red and not black like coffee grounds. Does the patient have any new or worsening symptoms? ---Yes Will a triage be completed? ---Yes Related visit to physician within the last 2 weeks? ---Yes Does the PT have any chronic conditions? (i.e. diabetes, asthma, this includes High risk factors for pregnancy, etc.) ---Yes List chronic conditions. ---CKD, diabetes Is this a behavioral health or substance abuse call? ---No Guidelines Guideline Title Affirmed Question Affirmed Notes Nurse Date/Time (Eastern Time) Vomiting [1] MODERATE vomiting (e.g., 3 - 5 times/day) AND [2] age > 66 years Toribio Harbour, RN, Joelene Millin 03/22/2022  1:07:20 PM PLEASE NOTE: All timestamps contained within this report are represented as [place] Standard Time. CONFIDENTIALTY NOTICE: This fax transmission is intended only for the addressee. It contains information that is legally privileged, confidential or otherwise protected from use or disclosure. If you are not the intended recipient, you are strictly prohibited from reviewing, disclosing, copying using or disseminating any of this information or taking any action in reliance on or regarding this information. If you have received this fax in error, please notify us immediately by telephone so that we can arrange for its return to [place]. Phone: [phone], Toll-Free: [phone], Fax: [phone] Page: 2 of 2 Call Id: [id number] Lucas. Time Eilene Ghazi Time) Disposition Final User 03/22/2022 12:55:08 PM Send to Urgent Soledad Gerlach, [place] 03/22/2022 1:11:55 PM Go to ED Now (or PCP triage) Yes Toribio Harbour, RN, Joelene Millin Final Disposition 03/22/2022 1:11:55 PM Go to ED Now (or PCP triage) Yes Toribio Harbour, RN, Renea Ee Disagree/Comply Comply Caller Understands Yes PreDisposition Call Doctor Care Advice Given Per Guideline GO TO ED NOW (OR PCP TRIAGE): * IF NO PCP (PRIMARY CARE PROVIDER) SECOND-LEVEL TRIAGE: You need to be seen within the next hour. Go to the [facility] at _[address] [place] as soon as you can. BRING A BUCKET IN CASE OF VOMITING: * You may wish to bring a bucket, pan, or plastic bag with you in case there is more vomiting during the drive. BRING MEDICINES: * Please bring a list of your current medicines when you go to see the doctor. * It is also a good idea to bring the pill bottles too. This will help the doctor to make certain you are taking the right medicines and the right dose.  CARE ADVICE per Vomiting (Adult) guideline. Comments User: Devoria Albe, RN Date/Time Eilene Ghazi Time): 03/22/2022 1:10:07 PM Blood sugar is 182. Referrals GO TO FACILITY UNDECIDE

## 2022-03-22 NOTE — ED Notes (Signed)
Bladder scan 175 ml

## 2022-03-23 ENCOUNTER — Inpatient Hospital Stay (HOSPITAL_COMMUNITY): Payer: Medicare Other

## 2022-03-23 ENCOUNTER — Ambulatory Visit: Payer: PRIVATE HEALTH INSURANCE | Admitting: Family Medicine

## 2022-03-23 DIAGNOSIS — D638 Anemia in other chronic diseases classified elsewhere: Secondary | ICD-10-CM | POA: Diagnosis not present

## 2022-03-23 DIAGNOSIS — E039 Hypothyroidism, unspecified: Secondary | ICD-10-CM | POA: Diagnosis not present

## 2022-03-23 DIAGNOSIS — D631 Anemia in chronic kidney disease: Secondary | ICD-10-CM

## 2022-03-23 DIAGNOSIS — E871 Hypo-osmolality and hyponatremia: Secondary | ICD-10-CM

## 2022-03-23 DIAGNOSIS — E860 Dehydration: Secondary | ICD-10-CM

## 2022-03-23 DIAGNOSIS — K56609 Unspecified intestinal obstruction, unspecified as to partial versus complete obstruction: Secondary | ICD-10-CM | POA: Diagnosis not present

## 2022-03-23 DIAGNOSIS — I5032 Chronic diastolic (congestive) heart failure: Secondary | ICD-10-CM

## 2022-03-23 DIAGNOSIS — N185 Chronic kidney disease, stage 5: Secondary | ICD-10-CM

## 2022-03-23 DIAGNOSIS — N179 Acute kidney failure, unspecified: Secondary | ICD-10-CM | POA: Diagnosis not present

## 2022-03-23 DIAGNOSIS — E875 Hyperkalemia: Secondary | ICD-10-CM

## 2022-03-23 LAB — COMPREHENSIVE METABOLIC PANEL
ALT: 14 U/L (ref 0–44)
AST: 11 U/L — ABNORMAL LOW (ref 15–41)
Albumin: 2.2 g/dL — ABNORMAL LOW (ref 3.5–5.0)
Alkaline Phosphatase: 55 U/L (ref 38–126)
Anion gap: 13 (ref 5–15)
BUN: 89 mg/dL — ABNORMAL HIGH (ref 8–23)
CO2: 18 mmol/L — ABNORMAL LOW (ref 22–32)
Calcium: 7.5 mg/dL — ABNORMAL LOW (ref 8.9–10.3)
Chloride: 102 mmol/L (ref 98–111)
Creatinine, Ser: 4.36 mg/dL — ABNORMAL HIGH (ref 0.44–1.00)
GFR, Estimated: 11 mL/min — ABNORMAL LOW (ref >60)
Glucose, Bld: 148 mg/dL — ABNORMAL HIGH (ref 70–99)
Potassium: 4.6 mmol/L (ref 3.5–5.1)
Sodium: 133 mmol/L — ABNORMAL LOW (ref 135–145)
Total Bilirubin: 0.7 mg/dL (ref 0.3–1.2)
Total Protein: 5 g/dL — ABNORMAL LOW (ref 6.5–8.1)

## 2022-03-23 LAB — GLUCOSE, CAPILLARY
Glucose-Capillary: 130 mg/dL — ABNORMAL HIGH (ref 70–99)
Glucose-Capillary: 145 mg/dL — ABNORMAL HIGH (ref 70–99)
Glucose-Capillary: 147 mg/dL — ABNORMAL HIGH (ref 70–99)
Glucose-Capillary: 155 mg/dL — ABNORMAL HIGH (ref 70–99)
Glucose-Capillary: 174 mg/dL — ABNORMAL HIGH (ref 70–99)

## 2022-03-23 LAB — CBC
HCT: 25.9 % — ABNORMAL LOW (ref 36.0–46.0)
Hemoglobin: 8.5 g/dL — ABNORMAL LOW (ref 12.0–15.0)
MCH: 27.7 pg (ref 26.0–34.0)
MCHC: 32.8 g/dL (ref 30.0–36.0)
MCV: 84.4 fL (ref 80.0–100.0)
Platelets: 311 10*3/uL (ref 150–400)
RBC: 3.07 MIL/uL — ABNORMAL LOW (ref 3.87–5.11)
RDW: 13.6 % (ref 11.5–15.5)
WBC: 7.5 10*3/uL (ref 4.0–10.5)
nRBC: 0 % (ref 0.0–0.2)

## 2022-03-23 LAB — APTT: aPTT: 33 seconds (ref 24–36)

## 2022-03-23 LAB — PHOSPHORUS: Phosphorus: 6.1 mg/dL — ABNORMAL HIGH (ref 2.5–4.6)

## 2022-03-23 LAB — MAGNESIUM: Magnesium: 2 mg/dL (ref 1.7–2.4)

## 2022-03-23 MED ORDER — HYDROMORPHONE HCL 1 MG/ML IJ SOLN: 0.5000 mg | INTRAMUSCULAR | Status: DC | PRN

## 2022-03-23 MED ORDER — HEPARIN SODIUM (PORCINE) 5000 UNIT/ML IJ SOLN
5000.0000 [IU] | Freq: Three times a day (TID) | INTRAMUSCULAR | Status: DC
  Administered 2022-03-23 – 2022-03-25 (×7): 5000 [IU] via SUBCUTANEOUS
  Filled 2022-03-23 (×7): qty 1

## 2022-03-23 MED ORDER — ACETAMINOPHEN 650 MG RE SUPP: 650.0000 mg | Freq: Four times a day (QID) | RECTAL | Status: DC | PRN

## 2022-03-23 MED ORDER — SODIUM CHLORIDE 0.9 % IV SOLN: INTRAVENOUS | Status: AC

## 2022-03-23 MED ORDER — ATORVASTATIN CALCIUM 40 MG PO TABS
80.0000 mg | ORAL_TABLET | Freq: Every day | ORAL | Status: DC
  Administered 2022-03-23 – 2022-03-25 (×3): 80 mg
  Filled 2022-03-23 (×3): qty 2

## 2022-03-23 MED ORDER — ACETAMINOPHEN 160 MG/5ML PO SOLN: 650.0000 mg | Freq: Four times a day (QID) | ORAL | Status: DC | PRN

## 2022-03-23 MED ORDER — SODIUM CHLORIDE 0.9 % IV SOLN: INTRAVENOUS | Status: DC

## 2022-03-23 MED ORDER — ONDANSETRON HCL 4 MG PO TABS: 4.0000 mg | ORAL_TABLET | Freq: Four times a day (QID) | ORAL | Status: DC | PRN

## 2022-03-23 MED ORDER — DIATRIZOATE MEGLUMINE & SODIUM 66-10 % PO SOLN
90.0000 mL | Freq: Once | ORAL | Status: AC
  Administered 2022-03-23: 90 mL via NASOGASTRIC
  Filled 2022-03-23: qty 90

## 2022-03-23 MED ORDER — INSULIN ASPART 100 UNIT/ML IJ SOLN
0.0000 [IU] | INTRAMUSCULAR | Status: DC
  Administered 2022-03-23 – 2022-03-24 (×4): 1 [IU] via SUBCUTANEOUS

## 2022-03-23 MED ORDER — ASPIRIN 81 MG PO CHEW
81.0000 mg | CHEWABLE_TABLET | Freq: Two times a day (BID) | ORAL | Status: DC
  Administered 2022-03-23 – 2022-03-25 (×5): 81 mg
  Filled 2022-03-23 (×5): qty 1

## 2022-03-23 MED ORDER — LABETALOL HCL 5 MG/ML IV SOLN: 10.0000 mg | INTRAVENOUS | Status: DC | PRN

## 2022-03-23 MED ORDER — CARVEDILOL 12.5 MG PO TABS
12.5000 mg | ORAL_TABLET | Freq: Two times a day (BID) | ORAL | Status: DC
  Administered 2022-03-23 – 2022-03-25 (×5): 12.5 mg
  Filled 2022-03-23 (×5): qty 1

## 2022-03-23 MED ORDER — ONDANSETRON HCL 4 MG/2ML IJ SOLN
4.0000 mg | Freq: Four times a day (QID) | INTRAMUSCULAR | Status: DC | PRN
  Administered 2022-03-24: 4 mg via INTRAVENOUS
  Filled 2022-03-23: qty 2

## 2022-03-23 MED ORDER — DIATRIZOATE MEGLUMINE & SODIUM 66-10 % PO SOLN
ORAL | Status: AC
  Filled 2022-03-23: qty 90

## 2022-03-23 MED ORDER — AMLODIPINE BESYLATE 5 MG PO TABS
10.0000 mg | ORAL_TABLET | Freq: Every day | ORAL | Status: DC
  Administered 2022-03-23 – 2022-03-25 (×3): 10 mg
  Filled 2022-03-23 (×3): qty 2

## 2022-03-23 MED ORDER — LEVOTHYROXINE SODIUM 75 MCG PO TABS
150.0000 ug | ORAL_TABLET | Freq: Every day | ORAL | Status: DC
  Administered 2022-03-24 – 2022-03-25 (×2): 150 ug
  Filled 2022-03-23 (×3): qty 2

## 2022-03-23 NOTE — Progress Notes (Signed)
Nurse tech informed this Probation officer that pt NG tube was found lying on the floor in pt room. Upon entering room, pt voiced that she did not want NG tube any longer. MD made aware, informed to not place NG back at this time. Will continue to monitor.

## 2022-03-23 NOTE — Progress Notes (Addendum)
Initial Nutrition Assessment  DOCUMENTATION CODES:  Not applicable  INTERVENTION:  Advance diet as able per MD Once diet advanced, consider addition of nutrition supplements.  If diet unable to be advanced in the next 72 hours, recommend initiation of TPN to prevent acute malnutrition  NUTRITION DIAGNOSIS:  Inadequate oral intake related to vomiting, nausea as evidenced by NPO status, per patient/family report.  GOAL:  Patient will meet greater than or equal to 90% of their needs  MONITOR:  Diet advancement, Labs, I & O's  REASON FOR ASSESSMENT:  Malnutrition Screening Tool    ASSESSMENT:  Pt with hx of HLD, HTN, CAD, DM type 2, CKD, COPD, CHF, and hx CVA presented to ED with nausea, vomiting, and fatigue. Daughter reported no BM x 4 days PTA and that emesis has been coffee-ground in appearance. Imaging in ED showed SBO.  Unable to reach pt on room phone at this time.   Reviewed chart and pt reports weight loss and poor appetite PTA. Reviewed weights and it does appear that pt has lost about 18.3% x 1 year and 3.8% x 3 months. Neither are significant for those time frames.  Noted that NG tube was placed for decompression but pt has removed x2 and stated she did not want it replaced. Surgery consulted in case SBO unable to be managed with bowel rest and decompression.  Pt reported no nausea since last night. Will monitor for ability to advance diet and if unable to within 72 hours, would recommend considering initiation of TPN.    Also noted no BM reported since 10/3. No regimen in place at this time. Would recommend addition of medications if bowels do not move in the next 24 hours.   Nutritionally Relevant Medications: Scheduled Meds:  atorvastatin  80 mg Per Tube Daily   insulin aspart  0-6 Units Subcutaneous Q4H   levothyroxine  150 mcg Per Tube Daily   PRN Meds: ondansetron  Labs Reviewed: Na 133 BUN 89, creatinine 4.36 Phosphorus 6.1 CBG ranges from 130-147 mg/dL  over the last 24 hours HgbA1c 8.6% (6/9)  NUTRITION - FOCUSED PHYSICAL EXAM: Defer to in-person assessment  Diet Order:   Diet Order             Diet NPO time specified  Diet effective now                   EDUCATION NEEDS:  Not appropriate for education at this time  Skin:  Skin Assessment: Reviewed RN Assessment  Last BM:  10/3  Height:  Ht Readings from Last 1 Encounters:  03/22/22 5\' 7"  (1.702 m)   Weight:  Wt Readings from Last 1 Encounters:  03/22/22 100.3 kg   Ideal Body Weight:  61.4 kg  BMI:  Body mass index is 34.63 kg/m.  Estimated Nutritional Needs:  Kcal:  1700-1900 kcal/d Protein:  85-100g/d Fluid:  >/=1.8 L/d   Ranell Patrick, RD, LDN Clinical Dietitian RD pager # available in [place]  After hours/weekend pager # available in [facility]

## 2022-03-23 NOTE — Progress Notes (Signed)
[facility]  SBFT with contrast to colon. Patient also had bowel movement. Clears now go slow. No soda.  Updated daughter.  Curlene Labrum, MD

## 2022-03-23 NOTE — TOC Initial Note (Signed)
Transition of Care [facility]) - Initial/Assessment Note    Patient Details  Name: Jennifer [name] MRN: [id number] Date of Birth: [birth year]  [facility]) CM/SW Contact:    Ihor Gully, LCSW Phone Number: 03/23/2022, 3:32 PM  Clinical Narrative:                 Patient from home with spouse and daughter. Admitted for SBO. Considered high risk for readmission. Uses walker. Has cane, BSC, [place]. Requires minimal assistance with ADLs. Plan is to d/c home.   Expected Discharge Plan: Home/Self Care Barriers to Discharge: Continued Medical Work up   Patient Goals and CMS Choice Patient states their goals for this hospitalization and ongoing recovery are:: return home      Expected Discharge Plan and Services Expected Discharge Plan: Home/Self Care       Living arrangements for the past 2 months: Single Family Home                                      Prior Living Arrangements/Services Living arrangements for the past 2 months: Single Family Home Lives with:: Adult Children, Spouse Patient language and need for interpreter reviewed:: Yes Do you feel safe going back to the place where you live?: Yes      Need for Family Participation in Patient Care: Yes (Comment) Care giver support system in place?: Yes (comment) Current home services: DME Criminal Activity/Legal Involvement Pertinent to Current Situation/Hospitalization: No - Comment as needed  Activities of Daily Living Home Assistive Devices/Equipment: Shower chair with back, CBG Meter, Eyeglasses, Dentures (specify type), Walker (specify type) (rolling walker) ADL Screening (condition at time of admission) Patient's cognitive ability adequate to safely complete daily activities?: Yes Is the patient deaf or have difficulty hearing?: No Does the patient have difficulty seeing, even when wearing glasses/contacts?: No Does the patient have difficulty concentrating, remembering, or making decisions?:  No Patient able to express need for assistance with ADLs?: Yes Does the patient have difficulty dressing or bathing?: Yes Independently performs ADLs?: Yes (appropriate for developmental age) Does the patient have difficulty walking or climbing stairs?: Yes Weakness of Legs: Both Weakness of Arms/Hands: Both  Permission Sought/Granted Permission sought to share information with : Family Supports    Share Information with NAME: [name], daughter, at bedside           Emotional Assessment     Affect (typically observed): Appropriate Orientation: : Oriented to Self, Oriented to Place, Oriented to  Time, Oriented to Situation Alcohol / Substance Use: Not Applicable Psych Involvement: No (comment)  Admission diagnosis:  Small bowel obstruction ([place]) [K56.609] Patient Active Problem List   Diagnosis Date Noted   Anemia of chronic disease 03/23/2022   Hyponatremia 03/23/2022   Hyperkalemia 03/23/2022   Dehydration 03/23/2022   Chronic diastolic CHF (congestive heart failure) ([place]) 03/23/2022   SBO (small bowel obstruction) ([place]) 03/22/2022   Closed left ankle fracture 01/26/2022   Type 2 diabetes mellitus with hyperglycemia ([place]) 01/07/2022   Nausea & vomiting 11/19/2021   Acute kidney injury superimposed on chronic kidney disease ([place]) 11/19/2021   Frequent urination 07/13/2021   Change in mental status 07/13/2021   Headache 07/13/2021   Gait abnormality 07/13/2021   Memory loss 07/13/2021   Abnormal urinalysis 07/13/2021   CKD (chronic kidney disease) stage 4, GFR 15-29 ml/min ([place]) 07/13/2021   Normocytic anemia 07/13/2021   Diabetic nephropathy ([place])  05/28/2020   Diabetic retinopathy ([place]) 05/28/2020   Presence of insulin pump (external) (internal) 05/28/2020   Diabetic macular edema of right eye with proliferative retinopathy associated with type 2 diabetes mellitus ([place]) 04/16/2020   Macular pucker, right eye 04/16/2020   Vitreous hemorrhage of left eye ([place])  04/16/2020   Pre-op examination 03/13/2020   Chronic venous insufficiency 02/06/2020   Constipation 05/28/2019   S/P TKR (total knee replacement), right 03/23/2019 05/07/2019   Primary osteoarthritis of right knee 04/02/2019   Unilateral primary osteoarthritis, right knee    Pedal edema 02/25/2019   Acute vertigo with vomiting and inability to stand 10/08/2018   Uncontrolled type 2 diabetes mellitus with hyperglycemia, with long-term current use of insulin ([place]) 10/08/2018   Former smoker 10/08/2018   Carotid artery disease ([place]) 03/15/2018   History of CVA (cerebrovascular accident) 11/16/2017   Morbid obesity ([place]) 11/09/2017   Low back pain 10/26/2017   Screening mammogram, encounter for 09/18/2017   CAD S/P percutaneous coronary angioplasty 11/04/2015   NSTEMI (non-ST elevated myocardial infarction) ([place]) 11/03/2015   Leukocytosis 08/16/2010   Acquired hypothyroidism 07/06/2010   Hyperlipidemia associated with type 2 diabetes mellitus ([place]) 08/28/2008   Essential hypertension 11/13/2007   Proliferative diabetic retinopathy of left eye with macular edema associated with type 2 diabetes mellitus ([place]) 09/21/2006   Diabetic peripheral neuropathy ([place]) 09/21/2006   Obesity (BMI 30-39.9) 09/21/2006   Adjustment disorder with mixed anxiety and depressed mood 09/21/2006   CARPAL TUNNEL SYNDROME 09/21/2006   COPD (chronic obstructive pulmonary disease) ([place]) 09/21/2006   EDEMA 09/21/2006   MIGRAINES, HX OF 09/21/2006   PCP:  Abner Greenspan, MD Pharmacy:   [facility], [place] - [address] [place] [address] Phone: [phone] Fax: [phone]     Social Determinants of Health (SDOH) Interventions    Readmission Risk Interventions     No data to display

## 2022-03-23 NOTE — Consult Note (Signed)
[facility] Consult  Reason for Consult: SBO  Referring Physician: ED   Chief Complaint   Nausea; Emesis; Fatigue     HPI: Jennifer [name] is a 66 y.o. female with a prior history of hysterectomy who comes in with complaints of nausea,vomiting and not having a BM since Thursday of last week. She has never had an obstruction before. She had an NG placed last night but it came out this AM and she does not want it back right now.  We had her swallow gastrografin for a SBFT earlier and she is doing ok with that so far. She denies any major pain or issues. She is not having any flatus or Bms.   Past Medical History:  Diagnosis Date   CAD (coronary artery disease)    cath 5/23 100% dist RCA lesion treated with 2 overlapping Integrity Resolute DES ( 2.25x60mm, 2.25x37mm), 60% mid RCA treated medically, 99% OM2 not amenable to PCI, 70% D1 lesion, EF normal   Hypercholesteremia    Hypertension    Hypothyroidism    Kidney disease    Renal disorder    Stroke ([place]) 10/2018   Thyroid disease    Type 2 diabetes mellitus ([place]) 10/08/2018    Past Surgical History:  Procedure Laterality Date   ABDOMINAL HYSTERECTOMY     CARDIAC CATHETERIZATION N/A 11/03/2015   Procedure: Left Heart Cath and Coronary Angiography;  Surgeon: Leonie Man, MD;  Location: [place] [facility];  Service: Cardiovascular;  Laterality: N/A;   CARDIAC CATHETERIZATION N/A 11/03/2015   Procedure: Coronary Stent Intervention;  Surgeon: Leonie Man, MD;  Location: [place] [facility];  Service: Cardiovascular;  Laterality: N/A;   CARPAL TUNNEL RELEASE     CORONARY ANGIOPLASTY     1 stent   TOTAL KNEE ARTHROPLASTY Right 04/02/2019   Procedure: TOTAL KNEE ARTHROPLASTY;  Surgeon: Carole Civil, MD;  Location: [facility];  Service: Orthopedics;  Laterality: Right;    Family History  Problem Relation Age of Onset   Heart disease Mother    Stroke Sister    Heart attack Brother    Breast cancer Neg Hx      Social History   Tobacco Use   Smoking status: Some Days    Packs/day: 0.25    Types: Cigarettes    Last attempt to quit: 05/13/2021    Years since quitting: 0.8   Smokeless tobacco: Never  Vaping Use   Vaping Use: Never used  Substance Use Topics   Alcohol use: No    Alcohol/week: 0.0 standard drinks of alcohol   Drug use: No    Medications: I have reviewed the patient's current medications. Prior to Admission:  Medications Prior to Admission  Medication Sig Dispense Refill Last Dose   amLODipine (NORVASC) 10 MG tablet Take 1 tablet (10 mg total) by mouth daily. 90 tablet 0    aspirin EC 81 MG tablet Take 1 tablet (81 mg total) by mouth 2 (two) times daily. Hold aspirin while you are on apixaban/Eliquis 1 tablet 0    atorvastatin (LIPITOR) 80 MG tablet TAKE 1 TABLET BY MOUTH ONCE DAILY 6 IN THE EVENING 90 tablet 0    BD PEN NEEDLE NANO 2ND GEN 32G X 4 MM MISC Inject into the skin 3 (three) times daily.      buPROPion (WELLBUTRIN SR) 150 MG 12 hr tablet Take 1 tablet (150 mg total) by mouth 2 (two) times daily. 180 tablet 3    carvedilol (COREG)  12.5 MG tablet Take 1 tablet (12.5 mg total) by mouth 2 (two) times daily with a meal. 180 tablet 0    cephALEXin (KEFLEX) 500 MG capsule Take 1 capsule (500 mg total) by mouth 2 (two) times daily for 7 days. 14 capsule 0    DULoxetine (CYMBALTA) 60 MG capsule Take 1 capsule (60 mg total) by mouth daily. 90 capsule 3    ferrous sulfate 325 (65 FE) MG tablet Take 325 mg by mouth daily with breakfast.      furosemide (LASIX) 40 MG tablet Take 40 mg by mouth daily. Once a day      HUMULIN 70/30 KWIKPEN (70-30) 100 UNIT/ML KwikPen Inject 15 units in the morning and if she eats 10 units at lunch and 15 units with supper meal (Patient taking differently: Inject 25 units in the morning and if she eats 15units at lunch and 25 units with supper meal) 15 mL 11    levothyroxine (SYNTHROID) 137 MCG tablet Take 150 mcg by mouth daily before breakfast.       levothyroxine (SYNTHROID) 150 MCG tablet Take 150 mcg by mouth daily.      losartan (COZAAR) 25 MG tablet Take 0.5 tablets (12.5 mg total) by mouth daily. 45 tablet 1    nitroGLYCERIN (NITROSTAT) 0.4 MG SL tablet Place 1 tablet (0.4 mg total) under the tongue every 5 (five) minutes x 3 doses as needed for chest pain. 25 tablet 3    Potassium Chloride ER 20 MEQ TBCR Take by mouth. 20 meq  once a day      sodium zirconium cyclosilicate (LOKELMA) 10 g PACK packet Take 10 g by mouth 2 (two) times daily. 2 packet 0    Scheduled:  amLODipine  10 mg Per Tube Daily   aspirin  81 mg Per Tube BID   atorvastatin  80 mg Per Tube Daily   carvedilol  12.5 mg Per Tube BID WC   heparin  5,000 Units Subcutaneous Q8H   insulin aspart  0-6 Units Subcutaneous Q4H   levothyroxine  150 mcg Per Tube Daily   Continuous:  sodium chloride 100 mL/hr at 03/23/22 1450   ZYS:AYTKZSWFUXNAT **OR** acetaminophen, HYDROmorphone (DILAUDID) injection, labetalol, ondansetron **OR** ondansetron (ZOFRAN) IV  Allergies  Allergen Reactions   Metformin Hcl Other (See Comments)     ROS:  A comprehensive review of systems was negative except for: Gastrointestinal: positive for constipation, nausea, and vomiting  Blood pressure 138/67, pulse 93, temperature 98.9 F (37.2 C), resp. rate 16, height 5\' 7"  (1.702 m), weight 100.3 kg, SpO2 95 %. Physical Exam Vitals reviewed.  Constitutional:      Appearance: She is obese.  HENT:     Head: Normocephalic.     Nose: Nose normal.  Eyes:     Extraocular Movements: Extraocular movements intact.  Cardiovascular:     Rate and Rhythm: Normal rate.  Pulmonary:     Effort: Pulmonary effort is normal.  Abdominal:     General: There is distension.     Palpations: Abdomen is soft.     Tenderness: There is no abdominal tenderness.  Musculoskeletal:     Right lower leg: No edema.     Left lower leg: No edema.  Skin:    General: Skin is warm.  Neurological:     General:  No focal deficit present.     Mental Status: She is alert and oriented to person, place, and time.  Psychiatric:        Mood  and Affect: Mood normal.        Behavior: Behavior normal.     Results: Results for orders placed or performed during the hospital encounter of 03/22/22 (from the past 48 hour(s))  Comprehensive metabolic panel     Status: Abnormal   Collection Time: 03/22/22  3:28 PM  Result Value Ref Range   Sodium 132 (L) 135 - 145 mmol/L   Potassium 5.3 (H) 3.5 - 5.1 mmol/L   Chloride 98 98 - 111 mmol/L   CO2 21 (L) 22 - 32 mmol/L   Glucose, Bld 206 (H) 70 - 99 mg/dL    Comment: Glucose reference range applies only to samples taken after fasting for at least 8 hours.   BUN 89 (H) 8 - 23 mg/dL   Creatinine, Ser 4.41 (H) 0.44 - 1.00 mg/dL   Calcium 8.0 (L) 8.9 - 10.3 mg/dL   Total Protein 5.8 (L) 6.5 - 8.1 g/dL   Albumin 2.6 (L) 3.5 - 5.0 g/dL   AST 12 (L) 15 - 41 U/L   ALT 15 0 - 44 U/L   Alkaline Phosphatase 71 38 - 126 U/L   Total Bilirubin 0.8 0.3 - 1.2 mg/dL   GFR, Estimated 10 (L) >60 mL/min    Comment: (NOTE) Calculated using the CKD-EPI Creatinine Equation (2021)    Anion gap 13 5 - 15    Comment: Performed at [facility], [address] [place], [place] [address]  CBC with Differential     Status: Abnormal   Collection Time: 03/22/22  3:28 PM  Result Value Ref Range   WBC 14.6 (H) 4.0 - 10.5 K/uL   RBC 3.45 (L) 3.87 - 5.11 MIL/uL   Hemoglobin 9.5 (L) 12.0 - 15.0 g/dL   HCT 28.6 (L) 36.0 - 46.0 %   MCV 82.9 80.0 - 100.0 fL   MCH 27.5 26.0 - 34.0 pg   MCHC 33.2 30.0 - 36.0 g/dL   RDW 13.6 11.5 - 15.5 %   Platelets 357 150 - 400 K/uL   nRBC 0.0 0.0 - 0.2 %   Neutrophils Relative % 86 %   Neutro Abs 12.6 (H) 1.7 - 7.7 K/uL   Lymphocytes Relative 3 %   Lymphs Abs 0.5 (L) 0.7 - 4.0 K/uL   Monocytes Relative 9 %   Monocytes Absolute 1.3 (H) 0.1 - 1.0 K/uL   Eosinophils Relative 1 %   Eosinophils Absolute 0.1 0.0 - 0.5 K/uL   Basophils Relative 0 %    Basophils Absolute 0.1 0.0 - 0.1 K/uL   Immature Granulocytes 1 %   Abs Immature Granulocytes 0.07 0.00 - 0.07 K/uL    Comment: Performed at [facility], [address], [place],  [address]  Blood gas, venous (at [facility] and [facility], not at [facility])     Status: Abnormal   Collection Time: 03/22/22  4:57 PM  Result Value Ref Range   pH, Ven 7.34 7.25 - 7.43   pCO2, Ven 43 (L) 44 - 60 mmHg   pO2, Ven <31 (LL) 32 - 45 mmHg    Comment: CRITICAL RESULT CALLED TO, READ BACK BY AND VERIFIED WITH: WHITE,M ON 03/22/22 AT 1730 BY LOY,C    Bicarbonate 23.2 20.0 - 28.0 mmol/L   Acid-base deficit 2.5 (H) 0.0 - 2.0 mmol/L   O2 Saturation 36.9 %   Patient temperature 36.8    Collection site RIGHT ANTECUBITAL    Drawn by [phone]     Comment: Performed at [facility], [address], [place], [place] [address]  Type and screen [facility]     Status: None   Collection Time: 03/22/22  7:10 PM  Result Value Ref Range   ABO/RH(D) A POS    Antibody Screen NEG    Sample Expiration      03/25/2022,2359 Performed at [facility], [address] [place], [place] [address]   Urinalysis, Routine w reflex microscopic     Status: Abnormal   Collection Time: 03/22/22  7:48 PM  Result Value Ref Range   Color, Urine YELLOW YELLOW   APPearance HAZY (A) CLEAR   Specific Gravity, Urine 1.013 1.005 - 1.030   pH 5.0 5.0 - 8.0   Glucose, UA 50 (A) NEGATIVE mg/dL   Hgb urine dipstick NEGATIVE NEGATIVE   Bilirubin Urine NEGATIVE NEGATIVE   Ketones, ur 5 (A) NEGATIVE mg/dL   Protein, ur >=300 (A) NEGATIVE mg/dL   Nitrite NEGATIVE NEGATIVE   Leukocytes,Ua NEGATIVE NEGATIVE   RBC / HPF 0-5 0 - 5 RBC/hpf   WBC, UA 0-5 0 - 5 WBC/hpf   Bacteria, UA RARE (A) NONE SEEN   Squamous Epithelial / LPF 0-5 0 - 5   Mucus PRESENT     Comment: Performed at [facility], [address] [place], [place] [address]  Comprehensive metabolic panel     Status: Abnormal   Collection Time: 03/23/22  3:55 AM  Result Value Ref Range    Sodium 133 (L) 135 - 145 mmol/L   Potassium 4.6 3.5 - 5.1 mmol/L   Chloride 102 98 - 111 mmol/L   CO2 18 (L) 22 - 32 mmol/L   Glucose, Bld 148 (H) 70 - 99 mg/dL    Comment: Glucose reference range applies only to samples taken after fasting for at least 8 hours.   BUN 89 (H) 8 - 23 mg/dL   Creatinine, Ser 4.36 (H) 0.44 - 1.00 mg/dL   Calcium 7.5 (L) 8.9 - 10.3 mg/dL   Total Protein 5.0 (L) 6.5 - 8.1 g/dL   Albumin 2.2 (L) 3.5 - 5.0 g/dL   AST 11 (L) 15 - 41 U/L   ALT 14 0 - 44 U/L   Alkaline Phosphatase 55 38 - 126 U/L   Total Bilirubin 0.7 0.3 - 1.2 mg/dL   GFR, Estimated 11 (L) >60 mL/min    Comment: (NOTE) Calculated using the CKD-EPI Creatinine Equation (2021)    Anion gap 13 5 - 15    Comment: Performed at [facility], [address], [place], [place] [address]  CBC     Status: Abnormal   Collection Time: 03/23/22  3:55 AM  Result Value Ref Range   WBC 7.5 4.0 - 10.5 K/uL   RBC 3.07 (L) 3.87 - 5.11 MIL/uL   Hemoglobin 8.5 (L) 12.0 - 15.0 g/dL   HCT 25.9 (L) 36.0 - 46.0 %   MCV 84.4 80.0 - 100.0 fL   MCH 27.7 26.0 - 34.0 pg   MCHC 32.8 30.0 - 36.0 g/dL   RDW 13.6 11.5 - 15.5 %   Platelets 311 150 - 400 K/uL   nRBC 0.0 0.0 - 0.2 %    Comment: Performed at [facility], [address], [place], [place] [address]  APTT     Status: None   Collection Time: 03/23/22  3:55 AM  Result Value Ref Range   aPTT 33 24 - 36 seconds    Comment: Performed at [facility], [address], [place],  [address]  Magnesium  Status: None   Collection Time: 03/23/22  3:55 AM  Result Value Ref Range   Magnesium 2.0 1.7 - 2.4 mg/dL    Comment: Performed at [facility], [address] [place], [place] [address]  Phosphorus     Status: Abnormal   Collection Time: 03/23/22  3:55 AM  Result Value Ref Range   Phosphorus 6.1 (H) 2.5 - 4.6 mg/dL    Comment: Performed at [facility], [address] [place], [place] [address]  Glucose, capillary     Status: Abnormal   Collection  Time: 03/23/22  6:11 AM  Result Value Ref Range   Glucose-Capillary 145 (H) 70 - 99 mg/dL    Comment: Glucose reference range applies only to samples taken after fasting for at least 8 hours.  Glucose, capillary     Status: Abnormal   Collection Time: 03/23/22  8:03 AM  Result Value Ref Range   Glucose-Capillary 130 (H) 70 - 99 mg/dL    Comment: Glucose reference range applies only to samples taken after fasting for at least 8 hours.   Comment 1 Notify RN    Comment 2 Document in Chart   Glucose, capillary     Status: Abnormal   Collection Time: 03/23/22 12:29 PM  Result Value Ref Range   Glucose-Capillary 147 (H) 70 - 99 mg/dL    Comment: Glucose reference range applies only to samples taken after fasting for at least 8 hours.   Personally reviewed CT- small bowel dilation transition in right pelvic region  DG Abd 1 View  Result Date: 03/23/2022 CLINICAL DATA:  Nasogastric tube placement EXAM: ABDOMEN - 1 VIEW COMPARISON:  03/22/2022 FINDINGS: The nasogastric tube is seen within the a proximal body of the stomach. Multiple dilated loops of small bowel are seen within the upper abdomen in keeping with a underlying small-bowel obstruction. No gross free intraperitoneal gas. Cholecystectomy clips seen in the right upper quadrant. IMPRESSION: Nasogastric tube within the proximal body of the stomach. Electronically Signed   By: Fidela Salisbury M.D.   On: 03/23/2022 01:49   DG Abdomen 1 View  Result Date: 03/22/2022 CLINICAL DATA:  NG tube placement. EXAM: ABDOMEN - 1 VIEW COMPARISON:  11/19/2021. FINDINGS: Examination is limited due to field of view. The bowel gas pattern is normal. An enteric tube terminates in the stomach. No definite renal calculi. Cholecystectomy clips are noted in the right upper quadrant. IMPRESSION: An enteric tube terminates in the stomach. Electronically Signed   By: Brett Fairy M.D.   On: 03/22/2022 20:53   CT Abdomen Pelvis Wo Contrast  Result Date:  03/22/2022 CLINICAL DATA:  Nausea and vomiting. Weakness and fatigue. EXAM: CT ABDOMEN AND PELVIS WITHOUT CONTRAST TECHNIQUE: Multidetector CT imaging of the abdomen and pelvis was performed following the standard protocol without IV contrast. RADIATION DOSE REDUCTION: This exam was performed according to the departmental dose-optimization program which includes automated exposure control, adjustment of the mA and/or kV according to patient size and/or use of iterative reconstruction technique. COMPARISON:  Most recent CT 10/08/2018 FINDINGS: Lower chest: Subsegmental atelectasis in the right lower lobe. Trace pericardial effusion. Hepatobiliary: No evidence of focal liver abnormality on this unenhanced exam. Clips in the gallbladder fossa postcholecystectomy. No biliary dilatation. Pancreas: Parenchymal atrophy. No ductal dilatation or inflammation. Spleen: Normal in size. The previous low-density lesion in the spleen is not well characterized on this unenhanced exam. Adrenals/Urinary Tract: No adrenal nodule. Extrarenal pelvis configuration of the left kidney. There is a punctate nonobstructing stone in  the mid left kidney. No hydronephrosis nor perinephric edema. No ureteral stones. Partially distended urinary bladder, no wall thickening. Stomach/Bowel: Dilated fluid-filled proximal small bowel, dimension measuring up to 5.1 cm. A discrete transition point is not well-defined, however suspected in the right lower quadrant, is the distal right lower quadrant small bowel loops are decompressed. Mild mesenteric edema. No pneumatosis or definite bowel wall thickening. The appendix is normal. Multifocal colonic diverticulosis without focal diverticulitis. Small volume of stool in the colon. Vascular/Lymphatic: Aortic and branch atherosclerosis. No aneurysm. No portal venous or mesenteric gas. No bulky adenopathy. Reproductive: Hysterectomy. No adnexal mass. Other: No free air. No ascites. Postsurgical change of the  anterior abdominal wall. No abdominal wall hernia. Musculoskeletal: Degenerative change in the spine, both hips and pubic symphysis. There are no acute or suspicious osseous abnormalities. IMPRESSION: 1. Small bowel obstruction with transition point in the right lower quadrant. 2. Colonic diverticulosis without focal diverticulitis. 3. Punctate nonobstructing left renal stone. Aortic Atherosclerosis (ICD10-I70.0). Electronically Signed   By: Keith Rake M.D.   On: 03/22/2022 18:26     Assessment & Plan:  RAYLIE MADDISON is a 66 y.o. female with a SBO. She is feeling ok but still has some nausea. Discussed if she vomits will need to replace the NG. Discussed the SBFT and that if that shows a blockage we will plan for surgery Friday. Repeat KUB at 630pm.    All questions were answered to the satisfaction of the patient and family.    Virl Cagey 03/23/2022, 4:12 PM

## 2022-03-23 NOTE — Progress Notes (Signed)
Walked into pt room and noticed NG tube lying in pt bed. MD aware, informed to place NG tube again. This Probation officer and assisting nurse placed NG tube to R nare with no difficulty. Pt tolerated well. KUB ordered to show correct placement. Pt resting comfortably in bed at this time.

## 2022-03-23 NOTE — Progress Notes (Signed)
Jennifer [name], daughter, [phone].  Curlene Labrum, MD

## 2022-03-23 NOTE — Progress Notes (Signed)
PROGRESS NOTE  Jennifer [name] CVE:[id number] DOB: 05/17/1956   PCP: Abner Greenspan, MD  Patient is from: Home.  DOA: 03/22/2022 LOS: 1  Chief complaints Chief Complaint  Patient presents with   Nausea   Emesis   Fatigue     Brief Narrative / Interim history: 66 year old F with PMH of abdominal hysterectomy, DM-2, CAD, COPD, CKD 4-5, diastolic CHF, PAD, CAS and CVA presenting with nausea, vomiting, fatigue and weakness and found to have small bowel obstruction, AKI and mild hyperkalemia.  CT abdomen and pelvis without contrast showed SBO with transition point in RLQ.  General surgery consulted.  Patient was started on NG tube and IV fluid and admitted.   Subjective: NG tube came out twice overnight, and patient refused further NG tube.  She has not had further nausea, emesis or abdominal pain.  Last emesis was before midnight in ED. No flatus or bowel movement either.  Denies chest pain or dyspnea.  Daughter at bedside.  Objective: Vitals:   03/22/22 2200 03/22/22 2244 03/23/22 0524 03/23/22 0917  BP: 130/80 (!) 119/54 (!) 145/54 138/67  Pulse: 86 91 98 93  Resp: 19 17 16    Temp:  98.1 F (36.7 C) 98.9 F (37.2 C)   TempSrc:      SpO2: 96% 96% 95%   Weight:  100.3 kg    Height:        Examination:  GENERAL: No apparent distress.  Nontoxic. HEENT: MMM.  Vision and hearing grossly intact.  NECK: Supple.  No apparent JVD.  RESP:  No IWOB.  Fair aeration bilaterally. CVS:  RRR. Heart sounds normal.  ABD/GI/GU: BS+. Abd soft, NTND.  MSK/EXT:  Moves extremities. No apparent deformity.  1+ BLE edema (chronic). SKIN: no apparent skin lesion or wound NEURO: Awake, alert and oriented fairly.  Limited insight.  No apparent focal neuro deficit. PSYCH: Calm. Normal affect.   Procedures:  NG tube x2  Microbiology summarized: None  Assessment and plan: Principal Problem:   SBO (small bowel obstruction) (HCC) Active Problems:   Obesity (BMI 30-39.9)   COPD (chronic  obstructive pulmonary disease) (HCC)   Acquired hypothyroidism   Leukocytosis   CAD S/P percutaneous coronary angioplasty   Uncontrolled type 2 diabetes mellitus with hyperglycemia, with long-term current use of insulin (HCC)   Acute kidney injury superimposed on chronic kidney disease (HCC)   Anemia of chronic disease   Hyponatremia   Hyperkalemia   Dehydration   Chronic diastolic CHF (congestive heart failure) (HCC)  Small bowel obstruction: First episode.  Has history of abdominal hysterectomy.  She presents with nausea, vomiting and weakness.  CT with SBO with transition point in RLQ.  NG tube came out twice last night.  She has not had further emesis or abdominal pain.  Not distended on exam.  No flatus or bowel movement yet. -General surgery following -SBO protocol initiated. -Resume IV fluid while NPO. -Mobilize patient  Nausea/vomiting: Likely due to the above. -Antiemetics as needed -Further management as above  AKI on CKD-5: Cr elevated but stable. Recent Labs    07/27/21 1051 11/19/21 1012 11/19/21 1845 11/20/21 0258 11/21/21 0358 11/22/21 0428 01/23/22 1720 03/18/22 1542 03/22/22 1528 03/23/22 0355  BUN 32* 81* 85* 76* 63* 55* 43* 47* 89* 89*  CREATININE 2.38* 4.05* 4.10* 3.75* 3.31* 3.06* 2.74* 3.35* 4.41* 4.36*  -IV fluid as above -Avoid nephrotoxic meds -Recheck in the morning  Hyperkalemia/hyponatremia/hyperphosphatemia: In the setting of renal failure.  Hyperkalemia resolved. -Recheck in the morning  Leukocytosis possibly reactive and dehydration.  Resolved.   Normocytic anemia/anemia of renal disease: Slight drop in Hgb likely from IV fluid Recent Labs    07/13/21 1022 07/27/21 1051 11/19/21 1012 11/19/21 1845 11/20/21 0608 01/23/22 1720 03/18/22 1542 03/22/22 1528 03/23/22 0355  HGB 10.1* 10.6* 11.2* 10.9* 10.2* 10.0* 10.0* 9.5* 8.5*  -Monitor H&H  IDDM-2 with hyperglycemia: A1c 8.6% in 11/2021. Recent Labs  Lab 03/23/22 0611  03/23/22 0803 03/23/22 1229  GLUCAP 145* 130* 147*  -Continue current insulin regimen -Check hemoglobin P8E  Chronic diastolic CHF: TTE in 4235 with LVEF of 55 to 60%, G1 DD.  Seems to be on p.o. Lasix at home. -Hold diuretics -Closely monitor fluid and respiratory status while on IV fluid   Chronic COPD: Stable. -continue with nebs as needed.   Essential hypertension: Normotensive. -Continue home Coreg and amlodipine. -Hold home losartan in the setting of AKI   History of CAD status post stent placement -Continue with aspirin, Coreg and Lipitor when patient resumes oral intake   PAD/Carotid stenosis/CVA -Continue with aspirin and statin when patient resumes oral intake   Hypothyroidism -Continue with Synthroid   Obesity (BMI 34.63) Body mass index is 34.63 kg/m.  Inadequate oral intake Nutrition Problem: Inadequate oral intake Etiology: vomiting, nausea Signs/Symptoms: NPO status, per patient/family report Interventions: Refer to RD note for recommendations   DVT prophylaxis:  heparin injection 5,000 Units Start: 03/23/22 0600 SCDs Start: 03/23/22 0215  Code Status: Full code Family Communication: Updated patient's daughter at bedside Level of care: Med-Surg Status is: Inpatient Remains inpatient appropriate because: Small bowel obstruction and AKI   Final disposition: Likely home once medically stable Consultants:  General surgery  Sch Meds:  Scheduled Meds:  amLODipine  10 mg Per Tube Daily   aspirin  81 mg Per Tube BID   atorvastatin  80 mg Per Tube Daily   carvedilol  12.5 mg Per Tube BID WC   heparin  5,000 Units Subcutaneous Q8H   insulin aspart  0-6 Units Subcutaneous Q4H   levothyroxine  150 mcg Per Tube Daily   Continuous Infusions:  sodium chloride     PRN Meds:.acetaminophen **OR** acetaminophen, HYDROmorphone (DILAUDID) injection, labetalol, ondansetron **OR** ondansetron (ZOFRAN) IV  Antimicrobials: Anti-infectives (From admission,  onward)    None        I have personally reviewed the following labs and images: CBC: Recent Labs  Lab 03/18/22 1542 03/22/22 1528 03/23/22 0355  WBC 11.2* 14.6* 7.5  NEUTROABS 9.4* 12.6*  --   HGB 10.0* 9.5* 8.5*  HCT 32.3* 28.6* 25.9*  MCV 87.3 82.9 84.4  PLT 311 357 311   BMP &GFR Recent Labs  Lab 03/18/22 1542 03/22/22 1528 03/23/22 0355  NA 134* 132* 133*  K 5.2* 5.3* 4.6  CL 107 98 102  CO2 20* 21* 18*  GLUCOSE 230* 206* 148*  BUN 47* 89* 89*  CREATININE 3.35* 4.41* 4.36*  CALCIUM 8.7* 8.0* 7.5*  MG  --   --  2.0  PHOS  --   --  6.1*   Estimated Creatinine Clearance: 15.4 mL/min (A) (by C-G formula based on SCr of 4.36 mg/dL (H)). Liver & Pancreas: Recent Labs  Lab 03/22/22 1528 03/23/22 0355  AST 12* 11*  ALT 15 14  ALKPHOS 71 55  BILITOT 0.8 0.7  PROT 5.8* 5.0*  ALBUMIN 2.6* 2.2*   No results for input(s): "LIPASE", "AMYLASE" in the last 168 hours. No results for input(s): "AMMONIA" in the last 168 hours. Diabetic: No  results for input(s): "HGBA1C" in the last 72 hours. Recent Labs  Lab 03/23/22 0611 03/23/22 0803 03/23/22 1229  GLUCAP 145* 130* 147*   Cardiac Enzymes: No results for input(s): "CKTOTAL", "CKMB", "CKMBINDEX", "TROPONINI" in the last 168 hours. No results for input(s): "PROBNP" in the last 8760 hours. Coagulation Profile: No results for input(s): "INR", "PROTIME" in the last 168 hours. Thyroid Function Tests: No results for input(s): "TSH", "T4TOTAL", "FREET4", "T3FREE", "THYROIDAB" in the last 72 hours. Lipid Profile: No results for input(s): "CHOL", "HDL", "LDLCALC", "TRIG", "CHOLHDL", "LDLDIRECT" in the last 72 hours. Anemia Panel: No results for input(s): "VITAMINB12", "FOLATE", "FERRITIN", "TIBC", "IRON", "RETICCTPCT" in the last 72 hours. Urine analysis:    Component Value Date/Time   COLORURINE YELLOW 03/22/2022 1948   APPEARANCEUR HAZY (A) 03/22/2022 1948   LABSPEC 1.013 03/22/2022 1948   PHURINE 5.0  03/22/2022 1948   GLUCOSEU 50 (A) 03/22/2022 [place] NEGATIVE 03/22/2022 1948   BILIRUBINUR NEGATIVE 03/22/2022 1948   BILIRUBINUR neg 11/19/2021 1015   KETONESUR 5 (A) 03/22/2022 1948   PROTEINUR >=300 (A) 03/22/2022 1948   UROBILINOGEN 0.2 11/19/2021 1015   NITRITE NEGATIVE 03/22/2022 1948   LEUKOCYTESUR NEGATIVE 03/22/2022 1948   Sepsis Labs: Invalid input(s): "PROCALCITONIN", "LACTICIDVEN"  Microbiology: Recent Results (from the past 240 hour(s))  Urine Culture     Status: Abnormal   Collection Time: 03/18/22  3:43 PM   Specimen: Urine, Catheterized  Result Value Ref Range Status   Specimen Description   Final    URINE, CATHETERIZED Performed at [facility], [address] [place], [place] [address]    Special Requests   Final    NONE Performed at [facility], [address], [place], [place] [address]    Culture >=100,000 COLONIES/mL ESCHERICHIA COLI (A)  Final   Report Status 03/21/2022 FINAL  Final   Organism ID, Bacteria ESCHERICHIA COLI (A)  Final      Susceptibility   Escherichia coli - MIC*    AMPICILLIN <=2 SENSITIVE Sensitive     CEFAZOLIN <=4 SENSITIVE Sensitive     CEFEPIME <=0.12 SENSITIVE Sensitive     CEFTRIAXONE <=0.25 SENSITIVE Sensitive     CIPROFLOXACIN <=0.25 SENSITIVE Sensitive     GENTAMICIN <=1 SENSITIVE Sensitive     IMIPENEM <=0.25 SENSITIVE Sensitive     NITROFURANTOIN <=16 SENSITIVE Sensitive     TRIMETH/SULFA <=20 SENSITIVE Sensitive     AMPICILLIN/SULBACTAM <=2 SENSITIVE Sensitive     PIP/TAZO <=4 SENSITIVE Sensitive     * >=100,000 COLONIES/mL ESCHERICHIA COLI    Radiology Studies: DG Abd 1 View  Result Date: 03/23/2022 CLINICAL DATA:  Nasogastric tube placement EXAM: ABDOMEN - 1 VIEW COMPARISON:  03/22/2022 FINDINGS: The nasogastric tube is seen within the a proximal body of the stomach. Multiple dilated loops of small bowel are seen within the upper abdomen in keeping with a underlying small-bowel obstruction. No gross free  intraperitoneal gas. Cholecystectomy clips seen in the right upper quadrant. IMPRESSION: Nasogastric tube within the proximal body of the stomach. Electronically Signed   By: Fidela Salisbury M.D.   On: 03/23/2022 01:49   DG Abdomen 1 View  Result Date: 03/22/2022 CLINICAL DATA:  NG tube placement. EXAM: ABDOMEN - 1 VIEW COMPARISON:  11/19/2021. FINDINGS: Examination is limited due to field of view. The bowel gas pattern is normal. An enteric tube terminates in the stomach. No definite renal calculi. Cholecystectomy clips are noted in the right upper quadrant. IMPRESSION: An enteric tube terminates in the stomach. Electronically  Signed   By: Brett Fairy M.D.   On: 03/22/2022 20:53   CT Abdomen Pelvis Wo Contrast  Result Date: 03/22/2022 CLINICAL DATA:  Nausea and vomiting. Weakness and fatigue. EXAM: CT ABDOMEN AND PELVIS WITHOUT CONTRAST TECHNIQUE: Multidetector CT imaging of the abdomen and pelvis was performed following the standard protocol without IV contrast. RADIATION DOSE REDUCTION: This exam was performed according to the departmental dose-optimization program which includes automated exposure control, adjustment of the mA and/or kV according to patient size and/or use of iterative reconstruction technique. COMPARISON:  Most recent CT 10/08/2018 FINDINGS: Lower chest: Subsegmental atelectasis in the right lower lobe. Trace pericardial effusion. Hepatobiliary: No evidence of focal liver abnormality on this unenhanced exam. Clips in the gallbladder fossa postcholecystectomy. No biliary dilatation. Pancreas: Parenchymal atrophy. No ductal dilatation or inflammation. Spleen: Normal in size. The previous low-density lesion in the spleen is not well characterized on this unenhanced exam. Adrenals/Urinary Tract: No adrenal nodule. Extrarenal pelvis configuration of the left kidney. There is a punctate nonobstructing stone in the mid left kidney. No hydronephrosis nor perinephric edema. No ureteral  stones. Partially distended urinary bladder, no wall thickening. Stomach/Bowel: Dilated fluid-filled proximal small bowel, dimension measuring up to 5.1 cm. A discrete transition point is not well-defined, however suspected in the right lower quadrant, is the distal right lower quadrant small bowel loops are decompressed. Mild mesenteric edema. No pneumatosis or definite bowel wall thickening. The appendix is normal. Multifocal colonic diverticulosis without focal diverticulitis. Small volume of stool in the colon. Vascular/Lymphatic: Aortic and branch atherosclerosis. No aneurysm. No portal venous or mesenteric gas. No bulky adenopathy. Reproductive: Hysterectomy. No adnexal mass. Other: No free air. No ascites. Postsurgical change of the anterior abdominal wall. No abdominal wall hernia. Musculoskeletal: Degenerative change in the spine, both hips and pubic symphysis. There are no acute or suspicious osseous abnormalities. IMPRESSION: 1. Small bowel obstruction with transition point in the right lower quadrant. 2. Colonic diverticulosis without focal diverticulitis. 3. Punctate nonobstructing left renal stone. Aortic Atherosclerosis (ICD10-I70.0). Electronically Signed   By: Keith Rake M.D.   On: 03/22/2022 18:26      Maggy Wyble T. [place]  If 7PM-7AM, please contact night-coverage [email or web address] 03/23/2022, 2:45 PM

## 2022-03-24 ENCOUNTER — Encounter (HOSPITAL_COMMUNITY): Payer: Self-pay | Admitting: Internal Medicine

## 2022-03-24 DIAGNOSIS — K56609 Unspecified intestinal obstruction, unspecified as to partial versus complete obstruction: Secondary | ICD-10-CM | POA: Diagnosis not present

## 2022-03-24 DIAGNOSIS — N179 Acute kidney failure, unspecified: Secondary | ICD-10-CM | POA: Diagnosis not present

## 2022-03-24 DIAGNOSIS — E872 Acidosis, unspecified: Secondary | ICD-10-CM

## 2022-03-24 DIAGNOSIS — E039 Hypothyroidism, unspecified: Secondary | ICD-10-CM | POA: Diagnosis not present

## 2022-03-24 DIAGNOSIS — D638 Anemia in other chronic diseases classified elsewhere: Secondary | ICD-10-CM | POA: Diagnosis not present

## 2022-03-24 LAB — COMPREHENSIVE METABOLIC PANEL
ALT: 15 U/L (ref 0–44)
AST: 11 U/L — ABNORMAL LOW (ref 15–41)
Albumin: 2.2 g/dL — ABNORMAL LOW (ref 3.5–5.0)
Alkaline Phosphatase: 57 U/L (ref 38–126)
Anion gap: 15 (ref 5–15)
BUN: 99 mg/dL — ABNORMAL HIGH (ref 8–23)
CO2: 17 mmol/L — ABNORMAL LOW (ref 22–32)
Calcium: 7.4 mg/dL — ABNORMAL LOW (ref 8.9–10.3)
Chloride: 105 mmol/L (ref 98–111)
Creatinine, Ser: 4.69 mg/dL — ABNORMAL HIGH (ref 0.44–1.00)
GFR, Estimated: 10 mL/min — ABNORMAL LOW (ref >60)
Glucose, Bld: 145 mg/dL — ABNORMAL HIGH (ref 70–99)
Potassium: 4.2 mmol/L (ref 3.5–5.1)
Sodium: 137 mmol/L (ref 135–145)
Total Bilirubin: 1.1 mg/dL (ref 0.3–1.2)
Total Protein: 5.2 g/dL — ABNORMAL LOW (ref 6.5–8.1)

## 2022-03-24 LAB — GLUCOSE, CAPILLARY
Glucose-Capillary: 130 mg/dL — ABNORMAL HIGH (ref 70–99)
Glucose-Capillary: 131 mg/dL — ABNORMAL HIGH (ref 70–99)
Glucose-Capillary: 135 mg/dL — ABNORMAL HIGH (ref 70–99)
Glucose-Capillary: 136 mg/dL — ABNORMAL HIGH (ref 70–99)
Glucose-Capillary: 148 mg/dL — ABNORMAL HIGH (ref 70–99)
Glucose-Capillary: 154 mg/dL — ABNORMAL HIGH (ref 70–99)
Glucose-Capillary: 169 mg/dL — ABNORMAL HIGH (ref 70–99)

## 2022-03-24 LAB — CBC
HCT: 28 % — ABNORMAL LOW (ref 36.0–46.0)
Hemoglobin: 9 g/dL — ABNORMAL LOW (ref 12.0–15.0)
MCH: 27.1 pg (ref 26.0–34.0)
MCHC: 32.1 g/dL (ref 30.0–36.0)
MCV: 84.3 fL (ref 80.0–100.0)
Platelets: 315 10*3/uL (ref 150–400)
RBC: 3.32 MIL/uL — ABNORMAL LOW (ref 3.87–5.11)
RDW: 13.8 % (ref 11.5–15.5)
WBC: 5.2 10*3/uL (ref 4.0–10.5)
nRBC: 0 % (ref 0.0–0.2)

## 2022-03-24 LAB — PHOSPHORUS: Phosphorus: 6.5 mg/dL — ABNORMAL HIGH (ref 2.5–4.6)

## 2022-03-24 LAB — MAGNESIUM: Magnesium: 2.1 mg/dL (ref 1.7–2.4)

## 2022-03-24 MED ORDER — ALUM & MAG HYDROXIDE-SIMETH 200-200-20 MG/5ML PO SUSP
15.0000 mL | Freq: Four times a day (QID) | ORAL | Status: DC | PRN
  Administered 2022-03-25: 15 mL via ORAL
  Filled 2022-03-24 (×2): qty 30

## 2022-03-24 MED ORDER — SODIUM BICARBONATE 650 MG PO TABS
650.0000 mg | ORAL_TABLET | Freq: Three times a day (TID) | ORAL | Status: DC
  Administered 2022-03-24 – 2022-03-25 (×3): 650 mg via ORAL
  Filled 2022-03-24 (×3): qty 1

## 2022-03-24 MED ORDER — LACTATED RINGERS IV SOLN: INTRAVENOUS | Status: AC

## 2022-03-24 NOTE — Progress Notes (Signed)
PROGRESS NOTE  Jennifer [name] YWV:[id number] DOB: 1956-02-14   PCP: Abner Greenspan, MD  Patient is from: Home.  DOA: 03/22/2022 LOS: 2  Chief complaints Chief Complaint  Patient presents with   Nausea   Emesis   Fatigue     Brief Narrative / Interim history: 66 year old F with PMH of abdominal hysterectomy, DM-2, CAD, COPD, CKD 4-5, diastolic CHF, PAD, CAS and CVA presenting with nausea, vomiting, fatigue and weakness and found to have small bowel obstruction, AKI and mild hyperkalemia.  CT abdomen and pelvis without contrast showed SBO with transition point in RLQ.  General surgery consulted.  Patient was started on NG tube and IV fluid and admitted.  SBO seems to have resolved clinically and radiologically.  Advanced to full liquid diet.  Nephrology consulted for AKI.    Subjective: Seen and examined earlier this morning.  No major events overnight of this morning.  No complaints.  She denies nausea, vomiting or abdominal pain.  Reports having loose bowel movements earlier this morning.  Tolerating clear liquid diet.  Objective: Vitals:   03/23/22 1642 03/23/22 1728 03/23/22 2007 03/24/22 0408  BP:  (!) 136/56 (!) 122/55 (!) 146/58  Pulse:  85 85 88  Resp:   20 18  Temp: 97.6 F (36.4 C)  97.9 F (36.6 C) 98.3 F (36.8 C)  TempSrc: Oral     SpO2:   96% 93%  Weight:      Height:        Examination:  GENERAL: No apparent distress.  Nontoxic. HEENT: MMM.  Vision and hearing grossly intact.  NECK: Supple.  No apparent JVD.  RESP:  No IWOB.  Fair aeration bilaterally. CVS:  RRR. Heart sounds normal.  ABD/GI/GU: BS+. Abd soft, NTND.  MSK/EXT:  Moves extremities. No apparent deformity.  1+ BLE edema.  SKIN: no apparent skin lesion or wound NEURO: Awake and alert. Oriented appropriately.  No apparent focal neuro deficit. PSYCH: Calm. Normal affect.   Procedures:  NG tube x2  Microbiology summarized: None  Assessment and plan: Principal Problem:   SBO (small  bowel obstruction) (HCC) Active Problems:   Obesity (BMI 30-39.9)   COPD (chronic obstructive pulmonary disease) (HCC)   Acquired hypothyroidism   Leukocytosis   CAD S/P percutaneous coronary angioplasty   Uncontrolled type 2 diabetes mellitus with hyperglycemia, with long-term current use of insulin (HCC)   Acute kidney injury superimposed on chronic kidney disease (HCC)   Anemia of chronic disease   Hyponatremia   Hyperkalemia   Dehydration   Chronic diastolic CHF (congestive heart failure) (HCC)  Small bowel obstruction: Presented with nausea, vomiting and weakness.  CT with SBO with transition point in RLQ.  SBO seems to have resolved clinically and radiologically. -General surgery following-advance diet to FLD -Continue IV fluid.  Change NS to LR given acidosis and renal failure. -Mobilize patient/PT/OT  Nausea/vomiting: Likely due to the above.  Resolved. -Antiemetics as needed  AKI on CKD-5: Cr higher than baseline.  Unclear if this is truly AKI or progressive CKD. Recent Labs    11/19/21 1012 11/19/21 1845 11/20/21 8546 11/21/21 0358 11/22/21 0428 01/23/22 1720 03/18/22 1542 03/22/22 1528 03/23/22 0355 03/24/22 0434  BUN 81* 85* 76* 63* 55* 43* 47* 89* 89* 99*  CREATININE 4.05* 4.10* 3.75* 3.31* 3.06* 2.74* 3.35* 4.41* 4.36* 4.69*  -Nephrology consulted-will see patient on 10/13. -Change NS to LR  Hyperkalemia/hyponatremia/hyperphosphatemia/metabolic acidosis: In the setting of renal failure.  Hyperkalemia and hyponatremia resolved. -Start sodium bicarbonate -Change  IV NS to LR.  Leukocytosis possibly reactive and dehydration.  Resolved.   Normocytic anemia/anemia of renal disease: Slight drop in Hgb likely from IV fluid Recent Labs    07/13/21 1022 07/27/21 1051 11/19/21 1012 11/19/21 1845 11/20/21 0608 01/23/22 1720 03/18/22 1542 03/22/22 1528 03/23/22 0355 03/24/22 0434  HGB 10.1* 10.6* 11.2* 10.9* 10.2* 10.0* 10.0* 9.5* 8.5* 9.0*  -Monitor  H&H  Uncontrolled IDDM-2 with hyperglycemia: A1c 8.6% in 11/2021. Recent Labs  Lab 03/23/22 2005 03/23/22 2354 03/24/22 0406 03/24/22 0736 03/24/22 1158  GLUCAP 174* 155* 148* 135* 130*  -Continue current insulin regimen -Check hemoglobin K4Y  Chronic diastolic CHF: TTE in 1856 with LVEF of 55 to 60%, G1 DD.  Seems to be on p.o. Lasix at home. -Continue holding diuretics. -Closely monitor fluid and respiratory status while on IV fluid   Chronic COPD: Stable. -continue with nebs as needed.   Essential hypertension: Normotensive. -Continue home Coreg and amlodipine. -Hold home losartan in the setting of AKI   History of CAD status post stent placement -Continue with aspirin, Coreg and Lipitor when patient resumes oral intake   PAD/Carotid stenosis/CVA -Continue with aspirin and statin when patient resumes oral intake   Hypothyroidism -Continue with Synthroid   Obesity (BMI 34.63) Body mass index is 34.63 kg/m.  Inadequate oral intake Nutrition Problem: Inadequate oral intake Etiology: vomiting, nausea Signs/Symptoms: NPO status, per patient/family report Interventions: Refer to RD note for recommendations   DVT prophylaxis:  heparin injection 5,000 Units Start: 03/23/22 0600 SCDs Start: 03/23/22 0215  Code Status: Full code Family Communication: Updated patient's husband, daughter and granddaughter at bedside Level of care: Telemetry Status is: Inpatient Remains inpatient appropriate because: Small bowel obstruction and AKI   Final disposition: Likely home once medically stable Consultants:  General surgery Nephrology  Sch Meds:  Scheduled Meds:  amLODipine  10 mg Per Tube Daily   aspirin  81 mg Per Tube BID   atorvastatin  80 mg Per Tube Daily   carvedilol  12.5 mg Per Tube BID WC   heparin  5,000 Units Subcutaneous Q8H   insulin aspart  0-6 Units Subcutaneous Q4H   levothyroxine  150 mcg Per Tube Daily   Continuous Infusions:  lactated ringers 125  mL/hr at 03/24/22 1307   PRN Meds:.acetaminophen **OR** acetaminophen, alum & mag hydroxide-simeth, HYDROmorphone (DILAUDID) injection, labetalol, ondansetron **OR** ondansetron (ZOFRAN) IV  Antimicrobials: Anti-infectives (From admission, onward)    None        I have personally reviewed the following labs and images: CBC: Recent Labs  Lab 03/18/22 1542 03/22/22 1528 03/23/22 0355 03/24/22 0434  WBC 11.2* 14.6* 7.5 5.2  NEUTROABS 9.4* 12.6*  --   --   HGB 10.0* 9.5* 8.5* 9.0*  HCT 32.3* 28.6* 25.9* 28.0*  MCV 87.3 82.9 84.4 84.3  PLT 311 357 311 315   BMP &GFR Recent Labs  Lab 03/18/22 1542 03/22/22 1528 03/23/22 0355 03/24/22 0434  NA 134* 132* 133* 137  K 5.2* 5.3* 4.6 4.2  CL 107 98 102 105  CO2 20* 21* 18* 17*  GLUCOSE 230* 206* 148* 145*  BUN 47* 89* 89* 99*  CREATININE 3.35* 4.41* 4.36* 4.69*  CALCIUM 8.7* 8.0* 7.5* 7.4*  MG  --   --  2.0 2.1  PHOS  --   --  6.1* 6.5*   Estimated Creatinine Clearance: 14.4 mL/min (A) (by C-G formula based on SCr of 4.69 mg/dL (H)). Liver & Pancreas: Recent Labs  Lab 03/22/22 1528 03/23/22 0355  03/24/22 0434  AST 12* 11* 11*  ALT 15 14 15   ALKPHOS 71 55 57  BILITOT 0.8 0.7 1.1  PROT 5.8* 5.0* 5.2*  ALBUMIN 2.6* 2.2* 2.2*   No results for input(s): "LIPASE", "AMYLASE" in the last 168 hours. No results for input(s): "AMMONIA" in the last 168 hours. Diabetic: No results for input(s): "HGBA1C" in the last 72 hours. Recent Labs  Lab 03/23/22 2005 03/23/22 2354 03/24/22 0406 03/24/22 0736 03/24/22 1158  GLUCAP 174* 155* 148* 135* 130*   Cardiac Enzymes: No results for input(s): "CKTOTAL", "CKMB", "CKMBINDEX", "TROPONINI" in the last 168 hours. No results for input(s): "PROBNP" in the last 8760 hours. Coagulation Profile: No results for input(s): "INR", "PROTIME" in the last 168 hours. Thyroid Function Tests: No results for input(s): "TSH", "T4TOTAL", "FREET4", "T3FREE", "THYROIDAB" in the last 72  hours. Lipid Profile: No results for input(s): "CHOL", "HDL", "LDLCALC", "TRIG", "CHOLHDL", "LDLDIRECT" in the last 72 hours. Anemia Panel: No results for input(s): "VITAMINB12", "FOLATE", "FERRITIN", "TIBC", "IRON", "RETICCTPCT" in the last 72 hours. Urine analysis:    Component Value Date/Time   COLORURINE YELLOW 03/22/2022 1948   APPEARANCEUR HAZY (A) 03/22/2022 1948   LABSPEC 1.013 03/22/2022 1948   PHURINE 5.0 03/22/2022 1948   GLUCOSEU 50 (A) 03/22/2022 [place] NEGATIVE 03/22/2022 1948   BILIRUBINUR NEGATIVE 03/22/2022 1948   BILIRUBINUR neg 11/19/2021 1015   KETONESUR 5 (A) 03/22/2022 1948   PROTEINUR >=300 (A) 03/22/2022 1948   UROBILINOGEN 0.2 11/19/2021 1015   NITRITE NEGATIVE 03/22/2022 1948   LEUKOCYTESUR NEGATIVE 03/22/2022 1948   Sepsis Labs: Invalid input(s): "PROCALCITONIN", "LACTICIDVEN"  Microbiology: Recent Results (from the past 240 hour(s))  Urine Culture     Status: Abnormal   Collection Time: 03/18/22  3:43 PM   Specimen: Urine, Catheterized  Result Value Ref Range Status   Specimen Description   Final    URINE, CATHETERIZED Performed at [facility], [address], [place], [place] [address]    Special Requests   Final    NONE Performed at [facility], [address] [place], [place] [address]    Culture >=100,000 COLONIES/mL ESCHERICHIA COLI (A)  Final   Report Status 03/21/2022 FINAL  Final   Organism ID, Bacteria ESCHERICHIA COLI (A)  Final      Susceptibility   Escherichia coli - MIC*    AMPICILLIN <=2 SENSITIVE Sensitive     CEFAZOLIN <=4 SENSITIVE Sensitive     CEFEPIME <=0.12 SENSITIVE Sensitive     CEFTRIAXONE <=0.25 SENSITIVE Sensitive     CIPROFLOXACIN <=0.25 SENSITIVE Sensitive     GENTAMICIN <=1 SENSITIVE Sensitive     IMIPENEM <=0.25 SENSITIVE Sensitive     NITROFURANTOIN <=16 SENSITIVE Sensitive     TRIMETH/SULFA <=20 SENSITIVE Sensitive     AMPICILLIN/SULBACTAM <=2 SENSITIVE Sensitive     PIP/TAZO <=4 SENSITIVE  Sensitive     * >=100,000 COLONIES/mL ESCHERICHIA COLI    Radiology Studies: DG Abd Portable 1V-Small Bowel Obstruction Protocol-initial, 8 hr delay  Result Date: 03/23/2022 CLINICAL DATA:  Bowel obstruction, 8 hour delay. EXAM: PORTABLE ABDOMEN - 1 VIEW COMPARISON:  Radiograph earlier today. CT yesterday. FINDINGS: There is enteric contrast within the ascending and sigmoid colon. Prominent persisting gaseous distention of small bowel in the central abdomen up to 4.4 cm. The enteric tube is not definitively seen on the current exam. IMPRESSION: 1. Enteric contrast has reached the ascending and sigmoid colon. 2. Persistent gaseous distention of small bowel in the central abdomen. Electronically Signed  By: Keith Rake M.D.   On: 03/23/2022 19:01      Dorlisa Savino T. [place]  If 7PM-7AM, please contact night-coverage [email or web address] 03/24/2022, 2:37 PM

## 2022-03-24 NOTE — Evaluation (Signed)
Physical Therapy Evaluation Patient Details Name: Jennifer [name] MRN: [id number] DOB: 08/26/1955 Today's Date: 03/24/2022  History of Present Illness  Jennifer [name] is a 67 y.o. 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  Patient presented to the emergency department about 4 days ago (10/6) due to urinary retention and painful urination.  She was treated and prescribed with antibiotics, however, since Saturday (10/7) she has had difficulty in being able to keep any food or fluid down.  Patient also presents with vomiting, and today the vomiting was coffee-ground color which resulted in EMS being activated and patient was taken to the ED for further evaluation and management.  Patient denies chest pain, shortness of breath, fever, chills, diarrhea or constipation.  Last bowel movement was 4 days ago.   Clinical Impression  Patient required RW held in place during sit to stands due to leaning backwards, this is baseline per patient with spouse assisting at home, demonstrates labored movement when taking steps and limited to a few steps away from bedside before requesting to sit due to c/o fatigue.  Patient tolerated sitting up in chair after therapy with her sister present in room.  Patient will benefit from continued skilled physical therapy in hospital and recommended venue below to increase strength, balance, endurance for safe ADLs and gait.          Recommendations for follow up therapy are one component of a multi-disciplinary discharge planning process, led by the attending physician.  Recommendations may be updated based on patient status, additional functional criteria and insurance authorization.  Follow Up Recommendations Home health PT      Assistance Recommended at Discharge Intermittent Supervision/Assistance  Patient  can return home with the following  A little help with walking and/or transfers;A little help with bathing/dressing/bathroom;Help with stairs or ramp for entrance;Assistance with cooking/housework    Equipment Recommendations None recommended by PT  Recommendations for Other Services       Functional Status Assessment Patient has had a recent decline in their functional status and demonstrates the ability to make significant improvements in function in a reasonable and predictable amount of time.     Precautions / Restrictions Precautions Precautions: Fall Restrictions Weight Bearing Restrictions: No      Mobility  Bed Mobility Overal bed mobility: Needs Assistance Bed Mobility: Supine to Sit     Supine to sit: Supervision     General bed mobility comments: slightly labored movement, increased time    Transfers Overall transfer level: Needs assistance Equipment used: Rolling walker (2 wheels) Transfers: Sit to/from Stand, Bed to chair/wheelchair/BSC Sit to Stand: Min guard   Step pivot transfers: Min guard       General transfer comment: labored movement, increased time, mild difficulty with sit to stands due to BLE weakness    Ambulation/Gait Ambulation/Gait assistance: Min guard, Min assist Gait Distance (Feet): 15 Feet Assistive device: Rolling walker (2 wheels) Gait Pattern/deviations: Decreased step length - right, Decreased step length - left, Decreased stride length Gait velocity: decreased     General Gait Details: slow labored cadence without loss of balance, limited mostly due to c/o fatigue and generalized weakness  Stairs            Wheelchair Mobility    Modified Rankin (Stroke Patients Only)       Balance Overall balance assessment: Needs assistance Sitting-balance  support: Feet supported, No upper extremity supported Sitting balance-Leahy Scale: Fair Sitting balance - Comments: fair/good seated at EOB   Standing balance support:  During functional activity, Bilateral upper extremity supported Standing balance-Leahy Scale: Fair Standing balance comment: using RW                             Pertinent Vitals/Pain Pain Assessment Pain Assessment: No/denies pain    Home Living Family/patient expects to be discharged to:: Private residence Living Arrangements: Spouse/significant other;Children Available Help at Discharge: Family;Available 24 hours/day Type of Home: House Home Access: Stairs to enter Entrance Stairs-Rails: Right;Left;Can reach both Entrance Stairs-Number of Steps: 5-6   Home Layout: One level Home Equipment: Conservation officer, nature (2 wheels);Cane - single point;Shower seat;BSC/3in1      Prior Function Prior Level of Function : Needs assist       Physical Assist : Mobility (physical);ADLs (physical) Mobility (physical): Bed mobility;Transfers;Gait;Stairs   Mobility Comments: household ambulator using RW ADLs Comments: assisted by family     Hand Dominance   Dominant Hand: Right    Extremity/Trunk Assessment   Upper Extremity Assessment Upper Extremity Assessment: Defer to OT evaluation    Lower Extremity Assessment Lower Extremity Assessment: Generalized weakness    Cervical / Trunk Assessment Cervical / Trunk Assessment: Normal  Communication   Communication: No difficulties  Cognition Arousal/Alertness: Awake/alert Behavior During Therapy: WFL for tasks assessed/performed Overall Cognitive Status: Within Functional Limits for tasks assessed                                          General Comments      Exercises     Assessment/Plan    PT Assessment Patient needs continued PT services  PT Problem List Decreased strength;Decreased activity tolerance;Decreased balance;Decreased mobility       PT Treatment Interventions DME instruction;Gait training;Stair training;Functional mobility training;Therapeutic activities;Therapeutic exercise;Balance  training;Patient/family education    PT Goals (Current goals can be found in the Care Plan section)  Acute Rehab PT Goals Patient Stated Goal: return home with family to assist PT Goal Formulation: With patient Time For Goal Achievement: 03/29/22 Potential to Achieve Goals: Good    Frequency Min 3X/week     Co-evaluation               AM-PAC PT "6 Clicks" Mobility  Outcome Measure Help needed turning from your back to your side while in a flat bed without using bedrails?: None Help needed moving from lying on your back to sitting on the side of a flat bed without using bedrails?: A Little Help needed moving to and from a bed to a chair (including a wheelchair)?: A Little Help needed standing up from a chair using your arms (e.g., wheelchair or bedside chair)?: A Little Help needed to walk in hospital room?: A Little Help needed climbing 3-5 steps with a railing? : A Lot 6 Click Score: 18    End of Session   Activity Tolerance: Patient tolerated treatment well;Patient limited by fatigue Patient left: in chair;with call bell/phone within reach;with family/visitor present Nurse Communication: Mobility status PT Visit Diagnosis: Unsteadiness on feet (R26.81);Other abnormalities of gait and mobility (R26.89);Muscle weakness (generalized) (M62.81)    Time: 6295-2841 PT Time Calculation (min) (ACUTE ONLY): 11 min   Charges:   PT Evaluation $PT Eval Low Complexity: 1 Low PT Treatments $  Therapeutic Activity: 8-22 mins        2:07 PM, 03/24/22 Lonell Grandchild, MPT Physical Therapist with [facility] [phone] office [phone] mobile phone

## 2022-03-24 NOTE — Plan of Care (Signed)
  Problem: Acute Rehab PT Goals(only PT should resolve) Goal: Pt Will Go Supine/Side To Sit Outcome: Progressing Flowsheets (Taken 03/24/2022 1409) Pt will go Supine/Side to Sit: with modified independence Goal: Patient Will Transfer Sit To/From Stand Outcome: Progressing Flowsheets (Taken 03/24/2022 1409) Patient will transfer sit to/from stand:  with supervision  with min guard assist Goal: Pt Will Transfer Bed To Chair/Chair To Bed Outcome: Progressing Flowsheets (Taken 03/24/2022 1409) Pt will Transfer Bed to Chair/Chair to Bed:  with supervision  with modified independence Goal: Pt Will Ambulate Outcome: Progressing Flowsheets (Taken 03/24/2022 1409) Pt will Ambulate:  50 feet  with supervision  with rolling walker   2:10 PM, 03/24/22 Lonell Grandchild, MPT Physical Therapist with [facility] [phone] office [phone] mobile phone

## 2022-03-24 NOTE — Progress Notes (Signed)
[facility] Progress Note     Subjective: Had BM, and colon with contrast on the KUB.   Objective: Vital signs in last 24 hours: Temp:  [97.6 F (36.4 C)-98.3 F (36.8 C)] 98.3 F (36.8 C) (10/12 0408) Pulse Rate:  [85-93] 88 (10/12 0408) Resp:  [16-20] 18 (10/12 0408) BP: (122-146)/(55-67) 146/58 (10/12 0408) SpO2:  [93 %-99 %] 93 % (10/12 0408) Last BM Date : 03/23/22  Intake/Output from previous day: No intake/output data recorded. Intake/Output this shift: Total I/O In: 1621 [I.V.:1621] Out: -   General appearance: alert and no distress Resp: normal work of breathing GI: soft, nondistended, mildly tender  Lab Results:  Recent Labs    03/23/22 0355 03/24/22 0434  WBC 7.5 5.2  HGB 8.5* 9.0*  HCT 25.9* 28.0*  PLT 311 315   BMET Recent Labs    03/23/22 0355 03/24/22 0434  NA 133* 137  K 4.6 4.2  CL 102 105  CO2 18* 17*  GLUCOSE 148* 145*  BUN 89* 99*  CREATININE 4.36* 4.69*  CALCIUM 7.5* 7.4*   PT/INR No results for input(s): "LABPROT", "INR" in the last 72 hours.  Studies/Results: DG Abd Portable 1V-Small Bowel Obstruction Protocol-initial, 8 hr delay  Result Date: 03/23/2022 CLINICAL DATA:  Bowel obstruction, 8 hour delay. EXAM: PORTABLE ABDOMEN - 1 VIEW COMPARISON:  Radiograph earlier today. CT yesterday. FINDINGS: There is enteric contrast within the ascending and sigmoid colon. Prominent persisting gaseous distention of small bowel in the central abdomen up to 4.4 cm. The enteric tube is not definitively seen on the current exam. IMPRESSION: 1. Enteric contrast has reached the ascending and sigmoid colon. 2. Persistent gaseous distention of small bowel in the central abdomen. Electronically Signed   By: Keith Rake M.D.   On: 03/23/2022 19:01   DG Abd 1 View  Result Date: 03/23/2022 CLINICAL DATA:  Nasogastric tube placement EXAM: ABDOMEN - 1 VIEW COMPARISON:  03/22/2022 FINDINGS: The nasogastric tube is seen within the a  proximal body of the stomach. Multiple dilated loops of small bowel are seen within the upper abdomen in keeping with a underlying small-bowel obstruction. No gross free intraperitoneal gas. Cholecystectomy clips seen in the right upper quadrant. IMPRESSION: Nasogastric tube within the proximal body of the stomach. Electronically Signed   By: Fidela Salisbury M.D.   On: 03/23/2022 01:49   DG Abdomen 1 View  Result Date: 03/22/2022 CLINICAL DATA:  NG tube placement. EXAM: ABDOMEN - 1 VIEW COMPARISON:  11/19/2021. FINDINGS: Examination is limited due to field of view. The bowel gas pattern is normal. An enteric tube terminates in the stomach. No definite renal calculi. Cholecystectomy clips are noted in the right upper quadrant. IMPRESSION: An enteric tube terminates in the stomach. Electronically Signed   By: Brett Fairy M.D.   On: 03/22/2022 20:53   CT Abdomen Pelvis Wo Contrast  Result Date: 03/22/2022 CLINICAL DATA:  Nausea and vomiting. Weakness and fatigue. EXAM: CT ABDOMEN AND PELVIS WITHOUT CONTRAST TECHNIQUE: Multidetector CT imaging of the abdomen and pelvis was performed following the standard protocol without IV contrast. RADIATION DOSE REDUCTION: This exam was performed according to the departmental dose-optimization program which includes automated exposure control, adjustment of the mA and/or kV according to patient size and/or use of iterative reconstruction technique. COMPARISON:  Most recent CT 10/08/2018 FINDINGS: Lower chest: Subsegmental atelectasis in the right lower lobe. Trace pericardial effusion. Hepatobiliary: No evidence of focal liver abnormality on this unenhanced exam. Clips in the gallbladder fossa postcholecystectomy.  No biliary dilatation. Pancreas: Parenchymal atrophy. No ductal dilatation or inflammation. Spleen: Normal in size. The previous low-density lesion in the spleen is not well characterized on this unenhanced exam. Adrenals/Urinary Tract: No adrenal nodule.  Extrarenal pelvis configuration of the left kidney. There is a punctate nonobstructing stone in the mid left kidney. No hydronephrosis nor perinephric edema. No ureteral stones. Partially distended urinary bladder, no wall thickening. Stomach/Bowel: Dilated fluid-filled proximal small bowel, dimension measuring up to 5.1 cm. A discrete transition point is not well-defined, however suspected in the right lower quadrant, is the distal right lower quadrant small bowel loops are decompressed. Mild mesenteric edema. No pneumatosis or definite bowel wall thickening. The appendix is normal. Multifocal colonic diverticulosis without focal diverticulitis. Small volume of stool in the colon. Vascular/Lymphatic: Aortic and branch atherosclerosis. No aneurysm. No portal venous or mesenteric gas. No bulky adenopathy. Reproductive: Hysterectomy. No adnexal mass. Other: No free air. No ascites. Postsurgical change of the anterior abdominal wall. No abdominal wall hernia. Musculoskeletal: Degenerative change in the spine, both hips and pubic symphysis. There are no acute or suspicious osseous abnormalities. IMPRESSION: 1. Small bowel obstruction with transition point in the right lower quadrant. 2. Colonic diverticulosis without focal diverticulitis. 3. Punctate nonobstructing left renal stone. Aortic Atherosclerosis (ICD10-I70.0). Electronically Signed   By: Keith Rake M.D.   On: 03/22/2022 18:26    Anti-infectives: Anti-infectives (From admission, onward)    None       Assessment/Plan: Patient with SBO that is resolving. Clears now. Will await more Bms.  Adv diet as tolerated    LOS: 2 days    Virl Cagey 03/24/2022

## 2022-03-24 NOTE — Progress Notes (Signed)
Pt ambulatory to the bathroom with minimal assist several times during this writers shift. Pt had regular bowel movement x1. No nausea or vomiting. Tolerating ice water per request. Pt did not want anything else aside from the ice water. Will continue to monitor.

## 2022-03-24 NOTE — Progress Notes (Signed)
[facility]  Reported BMs from RN. Up to full liquid diet.  Curlene Labrum, MD

## 2022-03-25 DIAGNOSIS — D638 Anemia in other chronic diseases classified elsewhere: Secondary | ICD-10-CM | POA: Diagnosis not present

## 2022-03-25 DIAGNOSIS — K56609 Unspecified intestinal obstruction, unspecified as to partial versus complete obstruction: Secondary | ICD-10-CM | POA: Diagnosis not present

## 2022-03-25 DIAGNOSIS — N179 Acute kidney failure, unspecified: Secondary | ICD-10-CM | POA: Diagnosis not present

## 2022-03-25 DIAGNOSIS — E039 Hypothyroidism, unspecified: Secondary | ICD-10-CM | POA: Diagnosis not present

## 2022-03-25 LAB — RENAL FUNCTION PANEL
Albumin: 2.2 g/dL — ABNORMAL LOW (ref 3.5–5.0)
Anion gap: 13 (ref 5–15)
BUN: 88 mg/dL — ABNORMAL HIGH (ref 8–23)
CO2: 17 mmol/L — ABNORMAL LOW (ref 22–32)
Calcium: 7.4 mg/dL — ABNORMAL LOW (ref 8.9–10.3)
Chloride: 106 mmol/L (ref 98–111)
Creatinine, Ser: 3.92 mg/dL — ABNORMAL HIGH (ref 0.44–1.00)
GFR, Estimated: 12 mL/min — ABNORMAL LOW (ref >60)
Glucose, Bld: 128 mg/dL — ABNORMAL HIGH (ref 70–99)
Phosphorus: 5.5 mg/dL — ABNORMAL HIGH (ref 2.5–4.6)
Potassium: 4 mmol/L (ref 3.5–5.1)
Sodium: 136 mmol/L (ref 135–145)

## 2022-03-25 LAB — GLUCOSE, CAPILLARY
Glucose-Capillary: 123 mg/dL — ABNORMAL HIGH (ref 70–99)
Glucose-Capillary: 144 mg/dL — ABNORMAL HIGH (ref 70–99)
Glucose-Capillary: 133 mg/dL — ABNORMAL HIGH (ref 70–99)
Glucose-Capillary: 147 mg/dL — ABNORMAL HIGH (ref 70–99)

## 2022-03-25 LAB — CBC
HCT: 28 % — ABNORMAL LOW (ref 36.0–46.0)
Hemoglobin: 8.9 g/dL — ABNORMAL LOW (ref 12.0–15.0)
MCH: 27.1 pg (ref 26.0–34.0)
MCHC: 31.8 g/dL (ref 30.0–36.0)
MCV: 85.4 fL (ref 80.0–100.0)
Platelets: 351 10*3/uL (ref 150–400)
RBC: 3.28 MIL/uL — ABNORMAL LOW (ref 3.87–5.11)
RDW: 13.6 % (ref 11.5–15.5)
WBC: 13.4 10*3/uL — ABNORMAL HIGH (ref 4.0–10.5)
nRBC: 0 % (ref 0.0–0.2)

## 2022-03-25 LAB — MAGNESIUM: Magnesium: 2 mg/dL (ref 1.7–2.4)

## 2022-03-25 MED ORDER — SODIUM BICARBONATE 650 MG PO TABS: 650.0000 mg | ORAL_TABLET | Freq: Two times a day (BID) | ORAL | 0 refills | Status: DC

## 2022-03-25 MED ORDER — FUROSEMIDE 40 MG PO TABS: 80.0000 mg | ORAL_TABLET | Freq: Every day | ORAL | Status: DC

## 2022-03-25 NOTE — Care Management Important Message (Signed)
Important Message  Patient Details  Name: Jennifer [name] MRN: [id number] Date of Birth: [birth year]   Medicare Important Message Given:        Tommy Medal 03/25/2022, 9:31 AM

## 2022-03-25 NOTE — TOC Transition Note (Signed)
Transition of Care [facility]) - CM/SW Discharge Note   Patient Details  Name: SHAMEEKA SILLIMAN MRN: [id number] Date of Birth: Aug 04, 1955  [facility] CM/SW Contact:  Shade Flood, LCSW Phone Number: 03/25/2022, 12:23 PM   Clinical Narrative:     Pt stable for dc home with HHPT today per MD. Damaris Schooner with pt to review dc planning. Pt agreeable to [facility] PT. CMS provider options reviewed. Referral accepted by Vaughan Basta at [facility]. Information added to AVS. There are no other TOC needs for dc.  Final next level of care: [place] Barriers to Discharge: Barriers Resolved   Patient Goals and CMS Choice Patient states their goals for this hospitalization and ongoing recovery are:: return home CMS Medicare.gov Compare Post Acute Care list provided to:: Patient Choice offered to / list presented to : Patient  Discharge Placement                       Discharge Plan and Services                          HH Arranged: PT [facility] Agency: [place] ([facility]) Date [place]: 03/25/22   Representative spoke with at [place]: [place] ([place]) Interventions     Readmission Risk Interventions     No data to display

## 2022-03-25 NOTE — Discharge Summary (Signed)
Physician Discharge Summary  Jennifer [name] WCH:[id number] DOB: 1955/07/25 DOA: 03/22/2022  PCP: Abner Greenspan, MD  Admit date: 03/22/2022 Discharge date: 03/25/2022 Admitted From: Home Disposition: Home Recommendations for Outpatient Follow-up:  Follow up with PCP and nephrology in 1 to 2 weeks Check BMP and CBC in 1 to 2 weeks Please follow up on the following pending results: None  Home Health: PT Equipment/Devices: None  Discharge Condition: Stable CODE STATUS: Full code  Follow-up Information     [name], [name], MD. Schedule an appointment as soon as possible for a visit in 1 week(s).   Specialties: Family Medicine, Radiology Contact information: [place] [address] [address] Follow up.   Why: [facility] staff will call you to schedule in home visits                Hospital course 66 year old F with PMH of abdominal hysterectomy, DM-2, CAD, COPD, CKD 4-5, diastolic CHF, PAD, CAS and CVA presenting with nausea, vomiting, fatigue and weakness and found to have small bowel obstruction, AKI and mild hyperkalemia.  CT abdomen and pelvis without contrast showed SBO with transition point in RLQ.  General surgery consulted.  Patient was started on NG tube and IV fluid and admitted.   SBO resolved clinically and radiologically.  Diet advanced and she tolerated soft diet.  She was cleared for discharge by general surgery for outpatient follow-up with PCP.   In regards to AKI, nephrology consulted but patient's AKI improved with IV fluid hydration.  He is discharged to follow-up with his nephrologist outpatient.  Advised to hold Lasix and potassium until follow-up with PCP or nephrology.  Home health PT ordered as recommended by therapy.  See individual problem list below for more.   Problems addressed during this hospitalization Principal Problem:   SBO (small bowel obstruction) (HCC) Active  Problems:   Obesity (BMI 30-39.9)   COPD (chronic obstructive pulmonary disease) (HCC)   Acquired hypothyroidism   Leukocytosis   CAD S/P percutaneous coronary angioplasty   Uncontrolled type 2 diabetes mellitus with hyperglycemia, with long-term current use of insulin (HCC)   Acute kidney injury superimposed on chronic kidney disease (HCC)   Anemia of chronic disease   Hyponatremia   Hyperkalemia   Dehydration   Chronic diastolic CHF (congestive heart failure) (HCC)   Small bowel obstruction: Presented with nausea, vomiting and weakness.  CT with SBO with transition point in RLQ.  SBO resolved clinically and radiologically.  Tolerated soft diet prior to discharge.  Cleared for discharge by general surgery.   Nausea/vomiting: Likely due to the above.  Resolved.   AKI on CKD-5: Cr higher than baseline.  AKI improved. Recent Labs    11/19/21 1845 11/20/21 0608 11/21/21 0358 11/22/21 0428 01/23/22 1720 03/18/22 1542 03/22/22 1528 03/23/22 0355 03/24/22 0434 03/25/22 0445  BUN 85* 76* 63* 55* 43* 47* 89* 89* 99* 88*  CREATININE 4.10* 3.75* 3.31* 3.06* 2.74* 3.35* 4.41* 4.36* 4.69* 3.92*  -Outpatient follow-up with nephrology -Hold Lasix and potassium supplementation until follow-up -Recheck renal function in 1 to 2 weeks   Hyperkalemia/hyponatremia/hyperphosphatemia: Hyperkalemia and hyponatremia resolved.  Phosphorus improved. -Holding potassium supplementation on discharge.  Metabolic acidosis: Likely due to IV fluid and renal failure. -P.o. sodium bicarbonate twice daily   Leukocytosis possibly reactive and dehydration.  Improved.   Normocytic anemia/anemia of renal disease: Slight drop in Hgb likely from IV fluid -Recheck  CBC at follow-up.  Uncontrolled IDDM-2 with hyperglycemia: A1c 8.6% in 11/2021. -Continue home insulin.  Chronic diastolic CHF: TTE in 3419 with LVEF of 55 to 60%, G1 DD.  Seems to be on p.o. Lasix at home. -Holding Lasix in the setting of AKI    Chronic COPD: Stable. -Continue home meds.   Essential hypertension: Normotensive. -Continue home Coreg and amlodipine.   History of CAD status post stent placement -Continue with aspirin, Coreg and Lipitor    PAD/Carotid stenosis/CVA -Continue with aspirin and statin when patient resumes oral intake   Hypothyroidism -Continue with Synthroid   Obesity (BMI 34.63)   Vital signs Vitals:   03/24/22 1430 03/24/22 2017 03/25/22 0416 03/25/22 1221  BP: (!) 114/58 (!) 142/64 (!) 135/55 (!) 150/55  Pulse: 77 90 93 84  Temp: 98.2 F (36.8 C) 98.2 F (36.8 C) 97.6 F (36.4 C) 98 F (36.7 C)  Resp: 18 19 18 18   Height:      Weight:      SpO2: 94% 98% 93% 97%  TempSrc: Oral   Oral  BMI (Calculated):         Discharge exam  GENERAL: No apparent distress.  Nontoxic. HEENT: MMM.  Vision and hearing grossly intact.  NECK: Supple.  No apparent JVD.  RESP:  No IWOB.  Fair aeration bilaterally. CVS:  RRR. Heart sounds normal.  ABD/GI/GU: BS+. Abd soft, NTND.  MSK/EXT:  Moves extremities. No apparent deformity.  Trace BLE edema. SKIN: no apparent skin lesion or wound NEURO: Awake and alert. Oriented fairly.  No apparent focal neuro deficit. PSYCH: Calm. Normal affect.   Discharge Instructions Discharge Instructions     Call MD for:  difficulty breathing, headache or visual disturbances   Complete by: As directed    Call MD for:  extreme fatigue   Complete by: As directed    Call MD for:  persistant dizziness or light-headedness   Complete by: As directed    Call MD for:  persistant nausea and vomiting   Complete by: As directed    Call MD for:  severe uncontrolled pain   Complete by: As directed    Diet - low sodium heart healthy   Complete by: As directed    Soft diet for the next 3 to 4 days.   Diet Carb Modified   Complete by: As directed    Discharge instructions   Complete by: As directed    It has been a pleasure taking care of you!  You were hospitalized  due to small bowel obstruction and worsening kidney function.  Your bowel obstruction seems to have resolved.  Your kidney function improved as well.  Continue soft diet for the next 3 to 4 days.  Follow-up with your kidney doctor in 1 to 2 weeks or sooner if needed.  We recommend holding your Lasix (furosemide) and potassium until you follow-up with your kidney doctor.  Please review your new medication list and the directions on your medications before you take them.   Take care,   Increase activity slowly   Complete by: As directed       Allergies as of 03/25/2022       Reactions   Metformin Hcl Other (See Comments)        Medication List     STOP taking these medications    cephALEXin 500 MG capsule Commonly known as: KEFLEX   Lokelma 10 g Pack packet Generic drug: sodium zirconium cyclosilicate   Potassium Chloride ER  20 MEQ Tbcr       TAKE these medications    amLODipine 10 MG tablet Commonly known as: NORVASC Take 1 tablet (10 mg total) by mouth daily.   aspirin EC 81 MG tablet Take 1 tablet (81 mg total) by mouth 2 (two) times daily. Hold aspirin while you are on apixaban/Eliquis   atorvastatin 80 MG tablet Commonly known as: LIPITOR TAKE 1 TABLET BY MOUTH ONCE DAILY 6 IN THE EVENING   BD Pen Needle Nano 2nd Gen 32G X 4 MM Misc Generic drug: Insulin Pen Needle Inject into the skin 3 (three) times daily.   buPROPion 150 MG 12 hr tablet Commonly known as: WELLBUTRIN SR Take 1 tablet (150 mg total) by mouth 2 (two) times daily.   carvedilol 12.5 MG tablet Commonly known as: COREG Take 1 tablet (12.5 mg total) by mouth 2 (two) times daily with a meal.   DULoxetine 60 MG capsule Commonly known as: CYMBALTA Take 1 capsule (60 mg total) by mouth daily.   ferrous sulfate 325 (65 FE) MG tablet Take 325 mg by mouth daily with breakfast.   furosemide 40 MG tablet Commonly known as: LASIX Take 2 tablets (80 mg total) by mouth daily. Once a day Start  taking on: April 01, 2022 What changed: These instructions start on April 01, 2022. If you are unsure what to do until then, ask your doctor or other care provider.   HumuLIN 70/30 KwikPen (70-30) 100 UNIT/ML KwikPen Generic drug: insulin isophane & regular human KwikPen Inject 15 units in the morning and if she eats 10 units at lunch and 15 units with supper meal What changed: additional instructions   levothyroxine 150 MCG tablet Commonly known as: SYNTHROID Take 150 mcg by mouth daily.   nitroGLYCERIN 0.4 MG SL tablet Commonly known as: NITROSTAT Place 1 tablet (0.4 mg total) under the tongue every 5 (five) minutes x 3 doses as needed for chest pain.   sodium bicarbonate 650 MG tablet Take 1 tablet (650 mg total) by mouth 2 (two) times daily.        Consultations: General surgery  Procedures/Studies:   DG Abd Portable 1V-Small Bowel Obstruction Protocol-initial, 8 hr delay  Result Date: 03/23/2022 CLINICAL DATA:  Bowel obstruction, 8 hour delay. EXAM: PORTABLE ABDOMEN - 1 VIEW COMPARISON:  Radiograph earlier today. CT yesterday. FINDINGS: There is enteric contrast within the ascending and sigmoid colon. Prominent persisting gaseous distention of small bowel in the central abdomen up to 4.4 cm. The enteric tube is not definitively seen on the current exam. IMPRESSION: 1. Enteric contrast has reached the ascending and sigmoid colon. 2. Persistent gaseous distention of small bowel in the central abdomen. Electronically Signed   By: Keith Rake M.D.   On: 03/23/2022 19:01   DG Abd 1 View  Result Date: 03/23/2022 CLINICAL DATA:  Nasogastric tube placement EXAM: ABDOMEN - 1 VIEW COMPARISON:  03/22/2022 FINDINGS: The nasogastric tube is seen within the a proximal body of the stomach. Multiple dilated loops of small bowel are seen within the upper abdomen in keeping with a underlying small-bowel obstruction. No gross free intraperitoneal gas. Cholecystectomy clips seen in the  right upper quadrant. IMPRESSION: Nasogastric tube within the proximal body of the stomach. Electronically Signed   By: Fidela Salisbury M.D.   On: 03/23/2022 01:49   DG Abdomen 1 View  Result Date: 03/22/2022 CLINICAL DATA:  NG tube placement. EXAM: ABDOMEN - 1 VIEW COMPARISON:  11/19/2021. FINDINGS: Examination is limited due to  field of view. The bowel gas pattern is normal. An enteric tube terminates in the stomach. No definite renal calculi. Cholecystectomy clips are noted in the right upper quadrant. IMPRESSION: An enteric tube terminates in the stomach. Electronically Signed   By: Brett Fairy M.D.   On: 03/22/2022 20:53   CT Abdomen Pelvis Wo Contrast  Result Date: 03/22/2022 CLINICAL DATA:  Nausea and vomiting. Weakness and fatigue. EXAM: CT ABDOMEN AND PELVIS WITHOUT CONTRAST TECHNIQUE: Multidetector CT imaging of the abdomen and pelvis was performed following the standard protocol without IV contrast. RADIATION DOSE REDUCTION: This exam was performed according to the departmental dose-optimization program which includes automated exposure control, adjustment of the mA and/or kV according to patient size and/or use of iterative reconstruction technique. COMPARISON:  Most recent CT 10/08/2018 FINDINGS: Lower chest: Subsegmental atelectasis in the right lower lobe. Trace pericardial effusion. Hepatobiliary: No evidence of focal liver abnormality on this unenhanced exam. Clips in the gallbladder fossa postcholecystectomy. No biliary dilatation. Pancreas: Parenchymal atrophy. No ductal dilatation or inflammation. Spleen: Normal in size. The previous low-density lesion in the spleen is not well characterized on this unenhanced exam. Adrenals/Urinary Tract: No adrenal nodule. Extrarenal pelvis configuration of the left kidney. There is a punctate nonobstructing stone in the mid left kidney. No hydronephrosis nor perinephric edema. No ureteral stones. Partially distended urinary bladder, no wall  thickening. Stomach/Bowel: Dilated fluid-filled proximal small bowel, dimension measuring up to 5.1 cm. A discrete transition point is not well-defined, however suspected in the right lower quadrant, is the distal right lower quadrant small bowel loops are decompressed. Mild mesenteric edema. No pneumatosis or definite bowel wall thickening. The appendix is normal. Multifocal colonic diverticulosis without focal diverticulitis. Small volume of stool in the colon. Vascular/Lymphatic: Aortic and branch atherosclerosis. No aneurysm. No portal venous or mesenteric gas. No bulky adenopathy. Reproductive: Hysterectomy. No adnexal mass. Other: No free air. No ascites. Postsurgical change of the anterior abdominal wall. No abdominal wall hernia. Musculoskeletal: Degenerative change in the spine, both hips and pubic symphysis. There are no acute or suspicious osseous abnormalities. IMPRESSION: 1. Small bowel obstruction with transition point in the right lower quadrant. 2. Colonic diverticulosis without focal diverticulitis. 3. Punctate nonobstructing left renal stone. Aortic Atherosclerosis (ICD10-I70.0). Electronically Signed   By: Keith Rake M.D.   On: 03/22/2022 18:26       The results of significant diagnostics from this hospitalization (including imaging, microbiology, ancillary and laboratory) are listed below for reference.     Microbiology: Recent Results (from the past 240 hour(s))  Urine Culture     Status: Abnormal   Collection Time: 03/18/22  3:43 PM   Specimen: Urine, Catheterized  Result Value Ref Range Status   Specimen Description   Final    URINE, CATHETERIZED Performed at [facility], [address] [place], [place] [address]    Special Requests   Final    NONE Performed at [facility], [address], [place], [place] [address]    Culture >=100,000 COLONIES/mL ESCHERICHIA COLI (A)  Final   Report Status 03/21/2022 FINAL  Final   Organism ID, Bacteria ESCHERICHIA COLI (A)   Final      Susceptibility   Escherichia coli - MIC*    AMPICILLIN <=2 SENSITIVE Sensitive     CEFAZOLIN <=4 SENSITIVE Sensitive     CEFEPIME <=0.12 SENSITIVE Sensitive     CEFTRIAXONE <=0.25 SENSITIVE Sensitive     CIPROFLOXACIN <=0.25 SENSITIVE Sensitive     GENTAMICIN <=1 SENSITIVE Sensitive  IMIPENEM <=0.25 SENSITIVE Sensitive     NITROFURANTOIN <=16 SENSITIVE Sensitive     TRIMETH/SULFA <=20 SENSITIVE Sensitive     AMPICILLIN/SULBACTAM <=2 SENSITIVE Sensitive     PIP/TAZO <=4 SENSITIVE Sensitive     * >=100,000 COLONIES/mL ESCHERICHIA COLI     Labs:  CBC: Recent Labs  Lab 03/18/22 1542 03/22/22 1528 03/23/22 0355 03/24/22 0434 03/25/22 0445  WBC 11.2* 14.6* 7.5 5.2 13.4*  NEUTROABS 9.4* 12.6*  --   --   --   HGB 10.0* 9.5* 8.5* 9.0* 8.9*  HCT 32.3* 28.6* 25.9* 28.0* 28.0*  MCV 87.3 82.9 84.4 84.3 85.4  PLT 311 357 311 315 351   BMP &GFR Recent Labs  Lab 03/18/22 1542 03/22/22 1528 03/23/22 0355 03/24/22 0434 03/25/22 0445  NA 134* 132* 133* 137 136  K 5.2* 5.3* 4.6 4.2 4.0  CL 107 98 102 105 106  CO2 20* 21* 18* 17* 17*  GLUCOSE 230* 206* 148* 145* 128*  BUN 47* 89* 89* 99* 88*  CREATININE 3.35* 4.41* 4.36* 4.69* 3.92*  CALCIUM 8.7* 8.0* 7.5* 7.4* 7.4*  MG  --   --  2.0 2.1 2.0  PHOS  --   --  6.1* 6.5* 5.5*   Estimated Creatinine Clearance: 17.2 mL/min (A) (by C-G formula based on SCr of 3.92 mg/dL (H)). Liver & Pancreas: Recent Labs  Lab 03/22/22 1528 03/23/22 0355 03/24/22 0434 03/25/22 0445  AST 12* 11* 11*  --   ALT 15 14 15   --   ALKPHOS 71 55 57  --   BILITOT 0.8 0.7 1.1  --   PROT 5.8* 5.0* 5.2*  --   ALBUMIN 2.6* 2.2* 2.2* 2.2*   No results for input(s): "LIPASE", "AMYLASE" in the last 168 hours. No results for input(s): "AMMONIA" in the last 168 hours. Diabetic: No results for input(s): "HGBA1C" in the last 72 hours. Recent Labs  Lab 03/24/22 1624 03/24/22 2010 03/24/22 2347 03/25/22 0418 03/25/22 0718  GLUCAP 154* 136*  131* 123* 144*   Cardiac Enzymes: No results for input(s): "CKTOTAL", "CKMB", "CKMBINDEX", "TROPONINI" in the last 168 hours. No results for input(s): "PROBNP" in the last 8760 hours. Coagulation Profile: No results for input(s): "INR", "PROTIME" in the last 168 hours. Thyroid Function Tests: No results for input(s): "TSH", "T4TOTAL", "FREET4", "T3FREE", "THYROIDAB" in the last 72 hours. Lipid Profile: No results for input(s): "CHOL", "HDL", "LDLCALC", "TRIG", "CHOLHDL", "LDLDIRECT" in the last 72 hours. Anemia Panel: No results for input(s): "VITAMINB12", "FOLATE", "FERRITIN", "TIBC", "IRON", "RETICCTPCT" in the last 72 hours. Urine analysis:    Component Value Date/Time   COLORURINE YELLOW 03/22/2022 1948   APPEARANCEUR HAZY (A) 03/22/2022 1948   LABSPEC 1.013 03/22/2022 1948   PHURINE 5.0 03/22/2022 1948   GLUCOSEU 50 (A) 03/22/2022 [place] NEGATIVE 03/22/2022 1948   BILIRUBINUR NEGATIVE 03/22/2022 1948   BILIRUBINUR neg 11/19/2021 1015   KETONESUR 5 (A) 03/22/2022 1948   PROTEINUR >=300 (A) 03/22/2022 1948   UROBILINOGEN 0.2 11/19/2021 1015   NITRITE NEGATIVE 03/22/2022 1948   LEUKOCYTESUR NEGATIVE 03/22/2022 1948   Sepsis Labs: Invalid input(s): "PROCALCITONIN", "LACTICIDVEN"   SIGNED:  Mercy Riding, MD  [facility] 03/25/2022, 2:54 PM

## 2022-03-25 NOTE — Progress Notes (Signed)
Patient ambulated twice this shift to the restroom and then tranferred from the chair back to bed. She slept on and off this shift. Only complaint from patient was she felt like she was having acid relfux. Gave Mylanto 33mL at 0033, this subsided the reflux. No further complaints, and continued to monitor patient.

## 2022-03-25 NOTE — Progress Notes (Addendum)
[facility]  Doing well. Having Bms. Eating.   BP (!) 150/55 (BP Location: Right Wrist)   Pulse 84   Temp 98 F (36.7 C) (Oral)   Resp 18   Ht 5\' 7"  (1.702 m)   Wt 100.3 kg   SpO2 97%   BMI 34.63 kg/m  Soft, nondistended, minimally tender  Patient s/p resolving SBO. Doing well. Having Bms. Follow up with PCP. Discussed that SBO can recur or may continue to get better.   Curlene Labrum, MD [facility] [address] [place], [place] [address] [phone] (office)

## 2022-03-26 LAB — HEMOGLOBIN A1C
Hgb A1c MFr Bld: 7.1 % — ABNORMAL HIGH (ref 4.8–5.6)
Mean Plasma Glucose: 157.07 mg/dL

## 2022-03-28 ENCOUNTER — Telehealth: Payer: Self-pay | Admitting: *Deleted

## 2022-03-28 ENCOUNTER — Telehealth: Payer: Self-pay

## 2022-03-28 ENCOUNTER — Other Ambulatory Visit: Payer: Self-pay

## 2022-03-28 DIAGNOSIS — N189 Chronic kidney disease, unspecified: Secondary | ICD-10-CM

## 2022-03-28 DIAGNOSIS — E1165 Type 2 diabetes mellitus with hyperglycemia: Secondary | ICD-10-CM

## 2022-03-28 DIAGNOSIS — K56609 Unspecified intestinal obstruction, unspecified as to partial versus complete obstruction: Secondary | ICD-10-CM

## 2022-03-28 NOTE — Patient Outreach (Signed)
Jennifer [name] 08/24/1955 [id number]   [facility] [ACO] Patient:  Medicare ACO REACH  *High risk  Post hospital referral for care coordination follow up for readmission prevention.  Natividad Brood, RN BSN [place]  [phone] business mobile phone Toll free office [phone]  *[place]  [phone] Fax number: [phone] [email or web address]

## 2022-03-28 NOTE — Telephone Encounter (Signed)
Transition Care Management Follow-up Telephone Call Date of discharge and from where: [facility] 03/25/2022 How have you been since you were released from the hospital? weak Any questions or concerns? Yes  Items Reviewed: Did the pt receive and understand the discharge instructions provided? Yes  Medications obtained and verified? Yes  Other? No  Any new allergies since your discharge? No  Dietary orders reviewed? Yes Do you have support at home? Yes   Home Care and Equipment/Supplies: Were home health services ordered? yes If so, what is the name of the agency? [facility]  Has the agency set up a time to come to the patient's home? yes Were any new equipment or medical supplies ordered?  No What is the name of the medical supply agency? N/a Were you able to get the supplies/equipment? not applicable Do you have any questions related to the use of the equipment or supplies? No  Functional Questionnaire: (I = Independent and D = Dependent) ADLs: i  Bathing/Dressing- D  Meal Prep- D  Eating- I  Maintaining continence- I  Transferring/Ambulation- D  Managing Meds- D  Follow up appointments reviewed:  PCP Hospital f/u appt confirmed? Yes  Scheduled to see dR [name] on 04/04/2022 @ 11:00. [facility] f/u appt confirmed? NO Are transportation arrangements needed? Yes  If their condition worsens, is the pt aware to call PCP or go to the Emergency Dept.? Yes Was the patient provided with contact information for the PCP's office or ED? Yes Was to pt encouraged to call back with questions or concerns? Yes

## 2022-03-28 NOTE — Chronic Care Management (AMB) (Signed)
  Care Coordination   Note   03/28/2022 Name: Jennifer [name] MRN: [id number] DOB: [birth year]  Jennifer [name] is a 66 y.o. year old female who sees [name], [name], MD for primary care. I reached out to Drinda Butts by phone today to offer care coordination services.  [name] was given information about Care Coordination services today including:   The Care Coordination services include support from the care team which includes your Nurse Coordinator, Clinical Social Worker, or Pharmacist.  The Care Coordination team is here to help remove barriers to the health concerns and goals most important to you. Care Coordination services are voluntary, and the patient may decline or stop services at any time by request to their care team member.   Care Coordination Consent Status: Patient agreed to services and verbal consent obtained.   Follow up plan:  Telephone appointment with care coordination team member scheduled for:  03/30/2022  Encounter Outcome:  Pt. Scheduled from referral   Julian Hy, [place] Direct Dial: [phone]

## 2022-03-30 ENCOUNTER — Ambulatory Visit: Payer: Self-pay

## 2022-03-30 NOTE — Patient Outreach (Signed)
  Care Coordination   Initial Visit Note   03/30/2022 Name: Jennifer [name] MRN: [id number] DOB: 1956/06/02  Jennifer [name] is a 66 y.o. year old female who sees [name], [name], MD for primary care. I  spoke with patients daughter/ designated party release, Ahni Bradwell  What matters to the patients health and wellness today?  Daughter states patient is exhibiting issues with her memory.  She states she will request a referral to the neurologist from patients primary care provider. Daughter states patient gets choked on her food and has occasional dry heaving. She states patient is not eating much but has started back eating solid foods today.   She states patient is also coughing up phlegm.  Denies patient being sick with cold or having allergies. She states patient does not have shortness of breath but does have some swelling in her lower extremities. She states patient was taken off of her fluid pill while in the hospital. She reports calling patients nephrologist today and was told to restart the fluid pill. Daughter states patient will have PT/ OT services with [facility] home [facility].  Home evaluation has been completed.  Daughter states patient is scheduled for a follow up visit with primary provider on 04/04/22.      Goals Addressed             This Visit's Progress    Patient / caregiver statement:  Management of health conditions       Care Coordination Interventions: Evaluation of current treatment plan status post hospital discharge and patient's adherence to plan as established by provider Reviewed medications with patient's caregiver and discussed importance of compliance.  Reviewed scheduled/upcoming provider appointments  Discussed plans with patient for ongoing care management follow up and provided patient with direct contact information for care management team Suggested protein drink as nutritional option for patient.  Advised to call nephrology office to schedule follow up  visit Daughter advised to notify patients primary care provider of patients choking issue/ difficulty with food and lack of appetite          SDOH assessments and interventions completed:  Yes  SDOH Interventions Today    Flowsheet Row Most Recent Value  SDOH Interventions   Food Insecurity Interventions Intervention Not Indicated  Housing Interventions Intervention Not Indicated  Transportation Interventions Intervention Not Indicated        Care Coordination Interventions Activated:  Yes  Care Coordination Interventions:  Yes, provided   Follow up plan: Follow up call scheduled for 04/25/22 at 3 pm    Encounter Outcome:  Pt. Visit Completed   Quinn Plowman RN,BSN,CCM [place] [phone] direct line

## 2022-04-01 ENCOUNTER — Telehealth: Payer: Self-pay | Admitting: Family Medicine

## 2022-04-01 NOTE — Telephone Encounter (Signed)
Jnnifer called to report that patient had a fall and hit her head. No open areas,but patient do report a headache. Patient do not think she needs to be checked out.

## 2022-04-01 NOTE — Telephone Encounter (Signed)
Aware, glad she is ok  ER precautions if she notes new symptoms/headache or dizziness

## 2022-04-01 NOTE — Telephone Encounter (Signed)
Unable to reach [facility]; left v/m would try to speak with pt; pt did not answer but Barnett Applebaum (DPR signed)said that she was at work and her sister was with pt; Barnett Applebaum said her sister would not answer the phone and I needed to call Gina back in 10 mins. I spoke back with Barnett Applebaum and she said pt is fine; pt did not fall and hit her head. Pt fell against the [place] but no injury noted by family. Gina appreciated call but said pt was fine and did not need anything. Sending note to Dr Glori Bickers and [place] CMA.

## 2022-04-01 NOTE — Telephone Encounter (Signed)
Will send to Triage

## 2022-04-04 ENCOUNTER — Inpatient Hospital Stay: Payer: PRIVATE HEALTH INSURANCE | Admitting: Family Medicine

## 2022-04-06 DIAGNOSIS — E7800 Pure hypercholesterolemia, unspecified: Secondary | ICD-10-CM

## 2022-04-06 DIAGNOSIS — N179 Acute kidney failure, unspecified: Secondary | ICD-10-CM | POA: Diagnosis not present

## 2022-04-06 DIAGNOSIS — N185 Chronic kidney disease, stage 5: Secondary | ICD-10-CM

## 2022-04-06 DIAGNOSIS — I6529 Occlusion and stenosis of unspecified carotid artery: Secondary | ICD-10-CM

## 2022-04-06 DIAGNOSIS — D72829 Elevated white blood cell count, unspecified: Secondary | ICD-10-CM

## 2022-04-06 DIAGNOSIS — Z8673 Personal history of transient ischemic attack (TIA), and cerebral infarction without residual deficits: Secondary | ICD-10-CM

## 2022-04-06 DIAGNOSIS — I132 Hypertensive heart and chronic kidney disease with heart failure and with stage 5 chronic kidney disease, or end stage renal disease: Secondary | ICD-10-CM | POA: Diagnosis not present

## 2022-04-06 DIAGNOSIS — D631 Anemia in chronic kidney disease: Secondary | ICD-10-CM

## 2022-04-06 DIAGNOSIS — E1122 Type 2 diabetes mellitus with diabetic chronic kidney disease: Secondary | ICD-10-CM | POA: Diagnosis not present

## 2022-04-06 DIAGNOSIS — F17210 Nicotine dependence, cigarettes, uncomplicated: Secondary | ICD-10-CM

## 2022-04-06 DIAGNOSIS — Z87440 Personal history of urinary (tract) infections: Secondary | ICD-10-CM

## 2022-04-06 DIAGNOSIS — I2510 Atherosclerotic heart disease of native coronary artery without angina pectoris: Secondary | ICD-10-CM

## 2022-04-06 DIAGNOSIS — J449 Chronic obstructive pulmonary disease, unspecified: Secondary | ICD-10-CM

## 2022-04-06 DIAGNOSIS — Z6836 Body mass index (BMI) 36.0-36.9, adult: Secondary | ICD-10-CM

## 2022-04-06 DIAGNOSIS — Z7982 Long term (current) use of aspirin: Secondary | ICD-10-CM

## 2022-04-06 DIAGNOSIS — Z794 Long term (current) use of insulin: Secondary | ICD-10-CM

## 2022-04-06 DIAGNOSIS — E1151 Type 2 diabetes mellitus with diabetic peripheral angiopathy without gangrene: Secondary | ICD-10-CM

## 2022-04-06 DIAGNOSIS — Z8719 Personal history of other diseases of the digestive system: Secondary | ICD-10-CM

## 2022-04-06 DIAGNOSIS — E669 Obesity, unspecified: Secondary | ICD-10-CM

## 2022-04-06 DIAGNOSIS — Z955 Presence of coronary angioplasty implant and graft: Secondary | ICD-10-CM

## 2022-04-06 DIAGNOSIS — E039 Hypothyroidism, unspecified: Secondary | ICD-10-CM

## 2022-04-06 DIAGNOSIS — Z9181 History of falling: Secondary | ICD-10-CM

## 2022-04-06 DIAGNOSIS — I5032 Chronic diastolic (congestive) heart failure: Secondary | ICD-10-CM | POA: Diagnosis not present

## 2022-04-06 DIAGNOSIS — E1165 Type 2 diabetes mellitus with hyperglycemia: Secondary | ICD-10-CM

## 2022-04-11 ENCOUNTER — Telehealth: Payer: Self-pay | Admitting: Family Medicine

## 2022-04-11 NOTE — Telephone Encounter (Signed)
Anne Ng from [facility] called in and stated that patient had a fall on Saturday 04/09/2022 with no injuries.

## 2022-04-11 NOTE — Telephone Encounter (Signed)
Thanks for letting me know / aware

## 2022-04-25 ENCOUNTER — Other Ambulatory Visit: Payer: Self-pay | Admitting: Student

## 2022-04-25 ENCOUNTER — Ambulatory Visit: Payer: Self-pay

## 2022-04-25 NOTE — Patient Outreach (Signed)
  Care Coordination   04/25/2022 Name: Jennifer [name] MRN: [id number] DOB: Dec 28, 1955   Care Coordination Outreach Attempts:  An unsuccessful telephone outreach was attempted for a scheduled appointment today.HIPAA compliant voice message left with call back phone number.   Follow Up Plan:  Additional outreach attempts will be made to offer the patient care coordination information and services.   Encounter Outcome:  No Answer  Care Coordination Interventions Activated:  No   Care Coordination Interventions:  No, not indicated    Quinn Plowman RN,BSN,CCM [place] [phone] direct line

## 2022-04-28 ENCOUNTER — Telehealth: Payer: Self-pay | Admitting: Internal Medicine

## 2022-04-28 MED ORDER — CARVEDILOL 12.5 MG PO TABS: 12.5000 mg | ORAL_TABLET | Freq: Two times a day (BID) | ORAL | 0 refills | Status: DC

## 2022-04-28 MED ORDER — ATORVASTATIN CALCIUM 80 MG PO TABS: ORAL_TABLET | ORAL | 0 refills | Status: DC

## 2022-04-28 NOTE — Telephone Encounter (Signed)
*  STAT* If patient is at the pharmacy, call can be transferred to refill team.   1. Which medications need to be refilled? (please list name of each medication and dose if known)   carvedilol (COREG) 12.5 MG tablet   2. Which pharmacy/location (including street and city if local pharmacy) is medication to be sent to?  [place], [place] [address]   3. Do they need a 30 day or 90 day supply? 90 day  Daughter stated patient is almost out of this medication.

## 2022-05-03 ENCOUNTER — Telehealth: Payer: Self-pay | Admitting: Family Medicine

## 2022-05-03 NOTE — Telephone Encounter (Signed)
Annette from [facility] called to let Dr. Glori Bickers know that the pt cancelled her physical therapy appts for all of last week & this week. Call back # [phone]

## 2022-05-03 NOTE — Telephone Encounter (Signed)
Aware Please check in to see how she is doing  Thanks

## 2022-05-04 ENCOUNTER — Telehealth: Payer: Self-pay | Admitting: Internal Medicine

## 2022-05-04 ENCOUNTER — Encounter: Payer: Self-pay | Admitting: Internal Medicine

## 2022-05-04 ENCOUNTER — Ambulatory Visit: Payer: Medicare Other | Attending: Internal Medicine | Admitting: Internal Medicine

## 2022-05-04 VITALS — BP 162/70 | HR 68 | Ht 67.0 in | Wt 243.0 lb

## 2022-05-04 DIAGNOSIS — M7989 Other specified soft tissue disorders: Secondary | ICD-10-CM | POA: Insufficient documentation

## 2022-05-04 DIAGNOSIS — R600 Localized edema: Secondary | ICD-10-CM | POA: Insufficient documentation

## 2022-05-04 DIAGNOSIS — I6522 Occlusion and stenosis of left carotid artery: Secondary | ICD-10-CM | POA: Diagnosis present

## 2022-05-04 DIAGNOSIS — E785 Hyperlipidemia, unspecified: Secondary | ICD-10-CM | POA: Insufficient documentation

## 2022-05-04 MED ORDER — NITROGLYCERIN 0.4 MG SL SUBL: 0.4000 mg | SUBLINGUAL_TABLET | SUBLINGUAL | 3 refills | Status: DC | PRN

## 2022-05-04 MED ORDER — ATORVASTATIN CALCIUM 80 MG PO TABS: ORAL_TABLET | ORAL | 6 refills | Status: DC

## 2022-05-04 MED ORDER — CARVEDILOL 12.5 MG PO TABS: 12.5000 mg | ORAL_TABLET | Freq: Two times a day (BID) | ORAL | 6 refills | Status: DC

## 2022-05-04 NOTE — Telephone Encounter (Signed)
Spoke with daughter and pt is doing good pt just cancelled appt due to family being in for the holiday's daughter said pt's doing so good they are going to d/c her from OT and PT soon (FYI to PCP)

## 2022-05-04 NOTE — Progress Notes (Signed)
Cardiology Office Note  Date: 05/04/2022   ID: Jennifer [name], DOB 1955-07-22, MRN [id number]  PCP:  Abner Greenspan, MD  Cardiologist:  Chalmers Guest, MD Electrophysiologist:  None   Reason for Office Visit: Follow-up of CAD   History of Present Illness: Jennifer [name] is a 66 y.o. female known to have CAD manifested by NSTEMI in 2017 s/p distal RCA PCI with residual mid RCA 60%, OM2 99% (not amenable to PCI) and D1 70% stenosis with normal LVEF, HTN, HLD, history of CVA, DM 2, thyroid disease presented to cardiology clinic for follow-up visit. Accompanied by daughter and granddaughter.  Patient was recently admitted on 03/2022 for small bowel obstruction.  But otherwise no hospitalizations or ER visits for chest pain. She did not even have to take NTG sublingual at any time. Patient denied any angina, DOE (has baseline SOB but no recent worsening), lightheadedness, dizziness, syncope, palpitations. She was noted to have bilateral lower EXTR swelling after hospital discharge in October 2023.  She tried using compression socks in the past but have not worked. Her kidney function has been worsening recently and currently she is following up with nephrology. Patient does not her blood pressures at home every day.   Past Medical History:  Diagnosis Date   CAD (coronary artery disease)    cath 5/23 100% dist RCA lesion treated with 2 overlapping Integrity Resolute DES ( 2.25x10mm, 2.25x2mm), 60% mid RCA treated medically, 99% OM2 not amenable to PCI, 70% D1 lesion, EF normal   Hypercholesteremia    Hypertension    Hypothyroidism    Kidney disease    Renal disorder    Stroke ([place]) 10/2018   Thyroid disease    Type 2 diabetes mellitus ([place]) 10/08/2018    Past Surgical History:  Procedure Laterality Date   ABDOMINAL HYSTERECTOMY     CARDIAC CATHETERIZATION N/A 11/03/2015   Procedure: Left Heart Cath and Coronary Angiography;  Surgeon: Leonie Man, MD;  Location: [place]  CV LAB;  Service: Cardiovascular;  Laterality: N/A;   CARDIAC CATHETERIZATION N/A 11/03/2015   Procedure: Coronary Stent Intervention;  Surgeon: Leonie Man, MD;  Location: [place] CV LAB;  Service: Cardiovascular;  Laterality: N/A;   CARPAL TUNNEL RELEASE     CORONARY ANGIOPLASTY     1 stent   TOTAL KNEE ARTHROPLASTY Right 04/02/2019   Procedure: TOTAL KNEE ARTHROPLASTY;  Surgeon: Carole Civil, MD;  Location: [facility];  Service: Orthopedics;  Laterality: Right;    Current Outpatient Medications  Medication Sig Dispense Refill   amLODipine (NORVASC) 10 MG tablet Take 1 tablet (10 mg total) by mouth daily. 90 tablet 0   aspirin EC 81 MG tablet Take 1 tablet (81 mg total) by mouth 2 (two) times daily. Hold aspirin while you are on apixaban/Eliquis 1 tablet 0   atorvastatin (LIPITOR) 80 MG tablet TAKE 1 TABLET BY MOUTH ONCE DAILY 6 IN THE EVENING 30 tablet 6   BD PEN NEEDLE NANO 2ND GEN 32G X 4 MM MISC Inject into the skin 3 (three) times daily.     buPROPion (WELLBUTRIN SR) 150 MG 12 hr tablet Take 1 tablet (150 mg total) by mouth 2 (two) times daily. 180 tablet 3   carvedilol (COREG) 12.5 MG tablet Take 1 tablet (12.5 mg total) by mouth 2 (two) times daily with a meal. 60 tablet 6   DULoxetine (CYMBALTA) 60 MG capsule Take 1 capsule (60 mg total) by mouth daily. 90 capsule 3  ferrous sulfate 325 (65 FE) MG tablet Take 325 mg by mouth 2 (two) times daily with a meal.     furosemide (LASIX) 40 MG tablet Take 2 tablets (80 mg total) by mouth daily. Once a day 30 tablet    HUMULIN 70/30 KWIKPEN (70-30) 100 UNIT/ML KwikPen Inject 15 units in the morning and if she eats 10 units at lunch and 15 units with supper meal (Patient taking differently: Inject 25 units in the morning and if she eats 15units at lunch and 25 units with supper meal) 15 mL 11   levothyroxine (SYNTHROID) 150 MCG tablet Take 150 mcg by mouth daily.     nitroGLYCERIN (NITROSTAT) 0.4 MG SL tablet Place 1 tablet (0.4 mg  total) under the tongue every 5 (five) minutes x 3 doses as needed for chest pain. 25 tablet 3   sodium bicarbonate 650 MG tablet Take 1 tablet (650 mg total) by mouth 2 (two) times daily. 20 tablet 0   No current facility-administered medications for this visit.   Allergies:  Metformin hcl   Social History: The patient  reports that she quit smoking about a year ago. Her smoking use included cigarettes. She smoked an average of .25 packs per day. She has never used smokeless tobacco. She reports that she does not drink alcohol and does not use drugs.   Family History: The patient's family history includes Heart attack in her brother; Heart disease in her mother; Stroke in her sister.   ROS:  Please see the history of present illness. Otherwise, complete review of systems is positive for none.  All other systems are reviewed and negative.   Physical Exam: VS:  Ht 5\' 7"  (1.702 m)   Wt 243 lb (110.2 kg)   BMI 38.06 kg/m , BMI Body mass index is 38.06 kg/m.  Wt Readings from Last 3 Encounters:  05/04/22 243 lb (110.2 kg)  03/22/22 221 lb 1.9 oz (100.3 kg)  03/18/22 229 lb (103.9 kg)    General: Patient appears comfortable at rest. HEENT: Conjunctiva and lids normal, oropharynx clear with moist mucosa. Neck: Supple, no elevated JVP or carotid bruits, no thyromegaly. Lungs: Clear to auscultation, nonlabored breathing at rest. Cardiac: Regular rate and rhythm, no S3 or significant systolic murmur, no pericardial rub. Abdomen: Soft, nontender, no hepatomegaly, bowel sounds present, no guarding or rebound. Extremities: 1-2+ pitting edema lower extremities. Skin: Warm and dry. Musculoskeletal: No kyphosis. Neuropsychiatric: Alert and oriented x3, affect grossly appropriate.  ECG:  An ECG dated 05/04/2022 was personally reviewed today and demonstrated:  Normal sinus rhythm and no ST-T changes  Recent Labwork: 07/13/2021: TSH 6.82 03/24/2022: ALT 15; AST 11 03/25/2022: BUN 88;  Creatinine, Ser 3.92; Hemoglobin 8.9; Magnesium 2.0; Platelets 351; Potassium 4.0; Sodium 136     Component Value Date/Time   CHOL 151 03/13/2020 1234   TRIG 96.0 03/13/2020 1234   HDL 48.80 03/13/2020 1234   CHOLHDL 3 03/13/2020 1234   VLDL 19.2 03/13/2020 1234   LDLCALC 83 03/13/2020 1234   LDLDIRECT 154.2 11/04/2011 1018    Other Studies Reviewed Today: Echo from 2020 LVEF preserved PFO present  USG carotids from 2020 No stenosis in R ICA 1-49 percent stenosis in L ICA  Assessment and Plan: Patient is a 66 year old F known to have CAD manifested by NSTEMI in 2017 s/p distal RCA PCI with residual mid RCA 60%, OM2 99% (not amenable to PCI) and D1 70% stenosis with normal LVEF, HTN, HLD, history of CVA, DM 2, thyroid  disease presented to cardiology clinic for follow-up visit.  #CAD manifested by NSTEMI in 2017 s/p distal RCA PCI with residual mid RCA 60%, OM2 99% (not amenable to PCI) and D1 70% stenosis with normal LVEF, currently angina free -Continue aspirin 81 mg once daily -Continue atorvastatin 80 mg nightly -SL NTG 0.4 mg as needed -ER precautions for chest pain provided -Patient has residual mid RCA 60% stenosis, OM 99% stenosis (unamenable to PCI ) and D1 70% stenosis for which medical management was recommended in 2017 -Obtain 2D echo for leg swelling  # HLD, unknown values -Continue atorvastatin 80 mg nightly.  Obtain lipid panel today.  Goal LDL less than 70.  # HTN, poorly controlled -Continue amlodipine 10 mg once daily -Continue carvedilol 12.5 mg twice daily -Instructed patient to check her blood pressures at home every day to rule out whitecoat hypertension.  # Carotid artery stenosis (1 to 49% in L ICA) -Continue atorvastatin 80 mg nightly  I have spent a total of 30 minutes with patient reviewing chart, EKGs, labs and examining patient as well as establishing an assessment and plan that was discussed with the patient.  > 50% of time was spent in direct  patient care.      Medication Adjustments/Labs and Tests Ordered: Current medicines are reviewed at length with the patient today.  Concerns regarding medicines are outlined above.   Tests Ordered: No orders of the defined types were placed in this encounter.   Medication Changes: Meds ordered this encounter  Medications   carvedilol (COREG) 12.5 MG tablet    Sig: Take 1 tablet (12.5 mg total) by mouth 2 (two) times daily with a meal.    Dispense:  60 tablet    Refill:  6    PLEASE CALL THE OFFICE, NEEDS APPOINTMENT   atorvastatin (LIPITOR) 80 MG tablet    Sig: TAKE 1 TABLET BY MOUTH ONCE DAILY 6 IN THE EVENING    Dispense:  30 tablet    Refill:  6    PLEASE CALL OFFICE FOR APPOINTMENT, OVERDUE    Disposition:  Follow up  6 months  Signed Tahani Potier Fidel Levy, MD, 05/04/2022 2:26 PM    [facility], [place], [place] [address]

## 2022-05-04 NOTE — Telephone Encounter (Signed)
Patient's daughter called and wanted to know if patient can come in at 2:20 pm instead of 3 pm. Please call back

## 2022-05-04 NOTE — Telephone Encounter (Signed)
I had asked that patient come at 2:20 pm, daughter confirmed

## 2022-05-04 NOTE — Patient Instructions (Addendum)
Medication Instructions:  Your physician has recommended you make the following change in your medication:  Start nitroglycerin 0.4 mg sublingual every 5 min x 3 doses as needed, if no relief after 3rd dose, please call 911 or go to ED  Labwork: Lipid (2 wks @ [facility])  Testing/Procedures: Your physician has requested that you have an echocardiogram. Echocardiography is a painless test that uses sound waves to create images of your heart. It provides your doctor with information about the size and shape of your heart and how well your heart's chambers and valves are working. This procedure takes approximately one hour. There are no restrictions for this procedure. Please do NOT wear cologne, perfume, aftershave, or lotions (deodorant is allowed). Please arrive 15 minutes prior to your appointment time.   Follow-Up:  Your physician recommends that you schedule a follow-up appointment in: 6 months  Any Other Special Instructions Will Be Listed Below (If Applicable).  If you need a refill on your cardiac medications before your next appointment, please call your pharmacy.

## 2022-05-09 ENCOUNTER — Ambulatory Visit: Payer: Self-pay

## 2022-05-09 NOTE — Patient Outreach (Signed)
  Care Coordination   05/09/2022 Name: MYRACLE FEBRES MRN: [id number] DOB: 05/25/1956   Care Coordination Outreach Attempts:  An unsuccessful telephone outreach was attempted for a scheduled appointment today.  Second telephone attempt to patients daughter, Omolola Mittman.  Unable to reach.  HIPAA compliant voice message left with call back phone number.   Follow Up Plan:  Additional outreach attempts will be made to offer the patient care coordination information and services.   Encounter Outcome:  No Answer   Care Coordination Interventions:  No, not indicated    Quinn Plowman [facility] [place] [phone] direct line

## 2022-05-11 ENCOUNTER — Other Ambulatory Visit: Payer: Self-pay | Admitting: Student

## 2022-05-13 ENCOUNTER — Telehealth: Payer: Self-pay | Admitting: *Deleted

## 2022-05-13 NOTE — Progress Notes (Signed)
  Care Coordination Note  05/13/2022 Name: BERDIE MALTER MRN: [id number] DOB: 1955/06/22  ALAIYAH BOLLMAN is a 66 y.o. year old female who is a primary care patient of [name], [name], MD and is actively engaged with the care management team. I reached out to Drinda Butts by phone today to assist with re-scheduling a follow up visit with the RN Case Manager  Follow up plan: Unsuccessful telephone outreach attempt made. A HIPAA compliant phone message was left for the patient providing contact information and requesting a return call.   Julian Hy, [place] Direct Dial: [phone]

## 2022-05-24 ENCOUNTER — Ambulatory Visit: Payer: Medicare Other | Attending: Internal Medicine

## 2022-05-24 DIAGNOSIS — I252 Old myocardial infarction: Secondary | ICD-10-CM | POA: Insufficient documentation

## 2022-05-24 DIAGNOSIS — N189 Chronic kidney disease, unspecified: Secondary | ICD-10-CM | POA: Diagnosis not present

## 2022-05-24 DIAGNOSIS — E1122 Type 2 diabetes mellitus with diabetic chronic kidney disease: Secondary | ICD-10-CM | POA: Diagnosis not present

## 2022-05-24 DIAGNOSIS — J449 Chronic obstructive pulmonary disease, unspecified: Secondary | ICD-10-CM | POA: Diagnosis not present

## 2022-05-24 DIAGNOSIS — I130 Hypertensive heart and chronic kidney disease with heart failure and stage 1 through stage 4 chronic kidney disease, or unspecified chronic kidney disease: Secondary | ICD-10-CM | POA: Insufficient documentation

## 2022-05-24 DIAGNOSIS — M7989 Other specified soft tissue disorders: Secondary | ICD-10-CM | POA: Diagnosis not present

## 2022-05-24 DIAGNOSIS — I509 Heart failure, unspecified: Secondary | ICD-10-CM | POA: Diagnosis not present

## 2022-05-24 DIAGNOSIS — R600 Localized edema: Secondary | ICD-10-CM

## 2022-05-24 DIAGNOSIS — Z87891 Personal history of nicotine dependence: Secondary | ICD-10-CM | POA: Diagnosis not present

## 2022-05-24 LAB — ECHOCARDIOGRAM COMPLETE
AR max vel: 2.09 cm2
AV Peak grad: 9.9 mmHg
Ao pk vel: 1.57 m/s
Area-P 1/2: 3.5 cm2
Calc EF: 58.4 %
MV M vel: 5 m/s
MV Peak grad: 100.1 mmHg
S' Lateral: 3.3 cm
Single Plane A2C EF: 59.3 %
Single Plane A4C EF: 57.1 %

## 2022-06-01 ENCOUNTER — Encounter: Payer: PRIVATE HEALTH INSURANCE | Admitting: Vascular Surgery

## 2022-06-15 ENCOUNTER — Encounter: Payer: Self-pay | Admitting: Vascular Surgery

## 2022-06-15 ENCOUNTER — Ambulatory Visit (INDEPENDENT_AMBULATORY_CARE_PROVIDER_SITE_OTHER): Payer: Medicare Other | Admitting: Vascular Surgery

## 2022-06-15 VITALS — BP 149/83 | HR 74 | Temp 97.3°F | Ht 67.0 in | Wt 236.8 lb

## 2022-06-15 DIAGNOSIS — N184 Chronic kidney disease, stage 4 (severe): Secondary | ICD-10-CM

## 2022-06-15 NOTE — Progress Notes (Signed)
[facility]ist of [place]  Patient name: Jennifer [name] MRN: [id number] DOB: September 17, 1955 Sex: female  REASON FOR CONSULT: Discuss access for hemodialysis  HPI: Jennifer [name] is a 67 y.o. female, who is here today for discussion of access for hemodialysis.  She has had progressive renal insufficiency related to hypertension diabetes.  Her creatinine is currently in the 4 range.  She is seen today for discussion of access for dialysis.  She has never been on dialysis.  She did have first-degree family member on dialysis so has some familiarity.  She is here today with her daughter.  She is right-handed.  She does not have a pacemaker.  She is not on anticoagulation.  Past Medical History:  Diagnosis Date   CAD (coronary artery disease)    cath 5/23 100% dist RCA lesion treated with 2 overlapping Integrity Resolute DES ( 2.25x9mm, 2.25x110mm), 60% mid RCA treated medically, 99% OM2 not amenable to PCI, 70% D1 lesion, EF normal   Hypercholesteremia    Hypertension    Hypothyroidism    Kidney disease    Renal disorder    Stroke ([place]) 10/2018   Thyroid disease    Type 2 diabetes mellitus ([place]) 10/08/2018    Family History  Problem Relation Age of Onset   Heart disease Mother    Stroke Sister    Heart attack Brother    Breast cancer Neg Hx     SOCIAL HISTORY: Social History   Socioeconomic History   Marital status: Married    Spouse name: Not on file   Number of children: Not on file   Years of education: Not on file   Highest education level: Not on file  Occupational History   Not on file  Tobacco Use   Smoking status: Former    Packs/day: 0.25    Types: Cigarettes    Quit date: 05/13/2021    Years since quitting: 1.0   Smokeless tobacco: Never  Vaping Use   Vaping Use: Never used  Substance and Sexual Activity   Alcohol use: No    Alcohol/week: 0.0 standard drinks of alcohol   Drug use: No   Sexual activity: Not  on file  Other Topics Concern   Not on file  Social History Narrative   Not on file   Social Determinants of Health   Financial Resource Strain: Low Risk  (10/25/2021)   Overall Financial Resource Strain (CARDIA)    Difficulty of Paying Living Expenses: Not hard at all  Food Insecurity: No Food Insecurity (03/30/2022)   Hunger Vital Sign    Worried About Running Out of Food in the Last Year: Never true    Ran Out of Food in the Last Year: Never true  Transportation Needs: No Transportation Needs (03/30/2022)   PRAPARE - Hydrologist (Medical): No    Lack of Transportation (Non-Medical): No  Physical Activity: Inactive (10/25/2021)   Exercise Vital Sign    Days of Exercise per Week: 0 days    Minutes of Exercise per Session: 0 min  Stress: No Stress Concern Present (10/25/2021)   [place]    Feeling of Stress : Not at all  Social Connections: Not on file  Intimate Partner Violence: Unknown (03/22/2022)   Humiliation, Afraid, Rape, and Kick questionnaire    Fear of Current or Ex-Partner: Patient refused    Emotionally Abused: Patient refused    Physically Abused:  Patient refused    Sexually Abused: Patient refused    Allergies  Allergen Reactions   Metformin Hcl Other (See Comments)    Current Outpatient Medications  Medication Sig Dispense Refill   amLODipine (NORVASC) 10 MG tablet Take 1 tablet by mouth once daily 90 tablet 3   aspirin EC 81 MG tablet Take 1 tablet (81 mg total) by mouth 2 (two) times daily. Hold aspirin while you are on apixaban/Eliquis 1 tablet 0   atorvastatin (LIPITOR) 80 MG tablet TAKE 1 TABLET BY MOUTH ONCE DAILY 6 IN THE EVENING 30 tablet 6   BD PEN NEEDLE NANO 2ND GEN 32G X 4 MM MISC Inject into the skin 3 (three) times daily.     buPROPion (WELLBUTRIN SR) 150 MG 12 hr tablet Take 1 tablet (150 mg total) by mouth 2 (two) times daily. 180 tablet 3    carvedilol (COREG) 12.5 MG tablet Take 1 tablet (12.5 mg total) by mouth 2 (two) times daily with a meal. 60 tablet 6   DULoxetine (CYMBALTA) 60 MG capsule Take 1 capsule (60 mg total) by mouth daily. 90 capsule 3   ergocalciferol (VITAMIN D2) 1.25 MG (50000 UT) capsule 1 capsule Orally for 30 day(s)     ferrous sulfate 325 (65 FE) MG tablet Take 325 mg by mouth 2 (two) times daily with a meal.     furosemide (LASIX) 40 MG tablet Take 2 tablets (80 mg total) by mouth daily. Once a day 30 tablet    HUMULIN 70/30 KWIKPEN (70-30) 100 UNIT/ML KwikPen Inject 15 units in the morning and if she eats 10 units at lunch and 15 units with supper meal (Patient taking differently: Inject 25 units in the morning and if she eats 15units at lunch and 25 units with supper meal) 15 mL 11   levothyroxine (SYNTHROID) 150 MCG tablet Take 150 mcg by mouth daily.     nitroGLYCERIN (NITROSTAT) 0.4 MG SL tablet Place 1 tablet (0.4 mg total) under the tongue every 5 (five) minutes x 3 doses as needed for chest pain (If no relief after 3rd dose, call 911 or go to ED.). 90 tablet 3   sodium bicarbonate 650 MG tablet Take 1 tablet (650 mg total) by mouth 2 (two) times daily. 20 tablet 0   No current facility-administered medications for this visit.    REVIEW OF SYSTEMS:  [X]  denotes positive finding, [ ]  denotes negative finding Cardiac  Comments:  Chest pain or chest pressure:    Shortness of breath upon exertion:    Short of breath when lying flat:    Irregular heart rhythm:        Vascular    Pain in calf, thigh, or hip brought on by ambulation:    Pain in feet at night that wakes you up from your sleep:     Blood clot in your veins:    Leg swelling:  x       Pulmonary    Oxygen at home:    Productive cough:     Wheezing:         Neurologic    Sudden weakness in arms or legs:     Sudden numbness in arms or legs:     Sudden onset of difficulty speaking or slurred speech:    Temporary loss of vision in one  eye:     Problems with dizziness:         Gastrointestinal    Blood in stool:  Vomited blood:         Genitourinary    Burning when urinating:     Blood in urine:        Psychiatric    Major depression:         Hematologic    Bleeding problems:    Problems with blood clotting too easily:        Skin    Rashes or ulcers:        Constitutional    Fever or chills:      PHYSICAL EXAM: Vitals:   06/15/22 1510  BP: (!) 149/83  Pulse: 74  Temp: (!) 97.3 F (36.3 C)  SpO2: 96%  Weight: 236 lb 12.8 oz (107.4 kg)  Height: 5\' 7"  (1.702 m)    GENERAL: The patient is a well-nourished female, in no acute distress. The vital signs are documented above. CARDIOVASCULAR: Plus radial pulses bilaterally.  Relatively small surface veins bilaterally.  She does have a large antecubital vein in the right arm. PULMONARY: There is good air exchange  MUSCULOSKELETAL: There are no major deformities or cyanosis. NEUROLOGIC: No focal weakness or paresthesias are detected. SKIN: There are no ulcers or rashes noted. PSYCHIATRIC: The patient has a normal affect.  DATA:  I imaged her veins with SonoSite ultrasound.  She does have a relatively small cephalic and basilic vein on the left.  She has a small basilic vein on the right but does have a large right antecubital vein and moderate to large cephalic vein above the antecubital on the right.  MEDICAL ISSUES: Had a long discussion with the patient and her daughter.  I discussed options to include tunneled hemodialysis catheter.  Also discussed the graft and AV fistula.  She does appear to be a candidate for a AV fistula on her right arm.  I discussed the potential for not maturation and also the potential for requiring superficial mobilization since her vein does run slightly deep to the fat.  Splane that it would require at least 3 months of maturation before the vein could be used as a fistula and also the possibility of not maturation.  We will  proceed with outpatient fistula creation on 06/28/2022 at [place], [place] [facility] Tel [phone] Pager [phone]  Note: Portions of this report may have been transcribed using voice recognition software.  Every effort has been made to ensure accuracy; however, inadvertent computerized transcription errors may still be present.

## 2022-06-15 NOTE — H&P (View-Only) (Signed)
[facility] Specialist of [place]  Patient name: Jennifer [name] MRN: [id number] DOB: [birth year] Sex: female  REASON FOR CONSULT: Discuss access for hemodialysis  HPI: Jennifer [name] is a 67 y.o. female, who is here today for discussion of access for hemodialysis.  She has had progressive renal insufficiency related to hypertension diabetes.  Her creatinine is currently in the 4 range.  She is seen today for discussion of access for dialysis.  She has never been on dialysis.  She did have first-degree family member on dialysis so has some familiarity.  She is here today with her daughter.  She is right-handed.  She does not have a pacemaker.  She is not on anticoagulation.  Past Medical History:  Diagnosis Date   CAD (coronary artery disease)    cath 5/23 100% dist RCA lesion treated with 2 overlapping Integrity Resolute DES ( 2.25x75mm, 2.25x62mm), 60% mid RCA treated medically, 99% OM2 not amenable to PCI, 70% D1 lesion, EF normal   Hypercholesteremia    Hypertension    Hypothyroidism    Kidney disease    Renal disorder    Stroke ([place]) 10/2018   Thyroid disease    Type 2 diabetes mellitus ([place]) 10/08/2018    Family History  Problem Relation Age of Onset   Heart disease Mother    Stroke Sister    Heart attack Brother    Breast cancer Neg Hx     SOCIAL HISTORY: Social History   Socioeconomic History   Marital status: Married    Spouse name: Not on file   Number of children: Not on file   Years of education: Not on file   Highest education level: Not on file  Occupational History   Not on file  Tobacco Use   Smoking status: Former    Packs/day: 0.25    Types: Cigarettes    Quit date: 05/13/2021    Years since quitting: 1.0   Smokeless tobacco: Never  Vaping Use   Vaping Use: Never used  Substance and Sexual Activity   Alcohol use: No    Alcohol/week: 0.0 standard drinks of alcohol   Drug use: No   Sexual activity: Not  on file  Other Topics Concern   Not on file  Social History Narrative   Not on file   Social Determinants of Health   Financial Resource Strain: Low Risk  (10/25/2021)   Overall Financial Resource Strain (CARDIA)    Difficulty of Paying Living Expenses: Not hard at all  Food Insecurity: No Food Insecurity (03/30/2022)   Hunger Vital Sign    Worried About Running Out of Food in the Last Year: Never true    Ran Out of Food in the Last Year: Never true  Transportation Needs: No Transportation Needs (03/30/2022)   PRAPARE - Hydrologist (Medical): No    Lack of Transportation (Non-Medical): No  Physical Activity: Inactive (10/25/2021)   Exercise Vital Sign    Days of Exercise per Week: 0 days    Minutes of Exercise per Session: 0 min  Stress: No Stress Concern Present (10/25/2021)   [place]    Feeling of Stress : Not at all  Social Connections: Not on file  Intimate Partner Violence: Unknown (03/22/2022)   Humiliation, Afraid, Rape, and Kick questionnaire    Fear of Current or Ex-Partner: Patient refused    Emotionally Abused: Patient refused    Physically Abused:  Patient refused    Sexually Abused: Patient refused    Allergies  Allergen Reactions   Metformin Hcl Other (See Comments)    Current Outpatient Medications  Medication Sig Dispense Refill   amLODipine (NORVASC) 10 MG tablet Take 1 tablet by mouth once daily 90 tablet 3   aspirin EC 81 MG tablet Take 1 tablet (81 mg total) by mouth 2 (two) times daily. Hold aspirin while you are on apixaban/Eliquis 1 tablet 0   atorvastatin (LIPITOR) 80 MG tablet TAKE 1 TABLET BY MOUTH ONCE DAILY 6 IN THE EVENING 30 tablet 6   BD PEN NEEDLE NANO 2ND GEN 32G X 4 MM MISC Inject into the skin 3 (three) times daily.     buPROPion (WELLBUTRIN SR) 150 MG 12 hr tablet Take 1 tablet (150 mg total) by mouth 2 (two) times daily. 180 tablet 3    carvedilol (COREG) 12.5 MG tablet Take 1 tablet (12.5 mg total) by mouth 2 (two) times daily with a meal. 60 tablet 6   DULoxetine (CYMBALTA) 60 MG capsule Take 1 capsule (60 mg total) by mouth daily. 90 capsule 3   ergocalciferol (VITAMIN D2) 1.25 MG (50000 UT) capsule 1 capsule Orally for 30 day(s)     ferrous sulfate 325 (65 FE) MG tablet Take 325 mg by mouth 2 (two) times daily with a meal.     furosemide (LASIX) 40 MG tablet Take 2 tablets (80 mg total) by mouth daily. Once a day 30 tablet    HUMULIN 70/30 KWIKPEN (70-30) 100 UNIT/ML KwikPen Inject 15 units in the morning and if she eats 10 units at lunch and 15 units with supper meal (Patient taking differently: Inject 25 units in the morning and if she eats 15units at lunch and 25 units with supper meal) 15 mL 11   levothyroxine (SYNTHROID) 150 MCG tablet Take 150 mcg by mouth daily.     nitroGLYCERIN (NITROSTAT) 0.4 MG SL tablet Place 1 tablet (0.4 mg total) under the tongue every 5 (five) minutes x 3 doses as needed for chest pain (If no relief after 3rd dose, call 911 or go to ED.). 90 tablet 3   sodium bicarbonate 650 MG tablet Take 1 tablet (650 mg total) by mouth 2 (two) times daily. 20 tablet 0   No current facility-administered medications for this visit.    REVIEW OF SYSTEMS:  [X]  denotes positive finding, [ ]  denotes negative finding Cardiac  Comments:  Chest pain or chest pressure:    Shortness of breath upon exertion:    Short of breath when lying flat:    Irregular heart rhythm:        Vascular    Pain in calf, thigh, or hip brought on by ambulation:    Pain in feet at night that wakes you up from your sleep:     Blood clot in your veins:    Leg swelling:  x       Pulmonary    Oxygen at home:    Productive cough:     Wheezing:         Neurologic    Sudden weakness in arms or legs:     Sudden numbness in arms or legs:     Sudden onset of difficulty speaking or slurred speech:    Temporary loss of vision in one  eye:     Problems with dizziness:         Gastrointestinal    Blood in stool:  Vomited blood:         Genitourinary    Burning when urinating:     Blood in urine:        Psychiatric    Major depression:         Hematologic    Bleeding problems:    Problems with blood clotting too easily:        Skin    Rashes or ulcers:        Constitutional    Fever or chills:      PHYSICAL EXAM: Vitals:   06/15/22 1510  BP: (!) 149/83  Pulse: 74  Temp: (!) 97.3 F (36.3 C)  SpO2: 96%  Weight: 236 lb 12.8 oz (107.4 kg)  Height: 5\' 7"  (1.702 m)    GENERAL: The patient is a well-nourished female, in no acute distress. The vital signs are documented above. CARDIOVASCULAR: Plus radial pulses bilaterally.  Relatively small surface veins bilaterally.  She does have a large antecubital vein in the right arm. PULMONARY: There is good air exchange  MUSCULOSKELETAL: There are no major deformities or cyanosis. NEUROLOGIC: No focal weakness or paresthesias are detected. SKIN: There are no ulcers or rashes noted. PSYCHIATRIC: The patient has a normal affect.  DATA:  I imaged her veins with SonoSite ultrasound.  She does have a relatively small cephalic and basilic vein on the left.  She has a small basilic vein on the right but does have a large right antecubital vein and moderate to large cephalic vein above the antecubital on the right.  MEDICAL ISSUES: Had a long discussion with the patient and her daughter.  I discussed options to include tunneled hemodialysis catheter.  Also discussed the graft and AV fistula.  She does appear to be a candidate for a AV fistula on her right arm.  I discussed the potential for not maturation and also the potential for requiring superficial mobilization since her vein does run slightly deep to the fat.  Splane that it would require at least 3 months of maturation before the vein could be used as a fistula and also the possibility of not maturation.  We will  proceed with outpatient fistula creation on 06/28/2022 at [place], MD [facility] Tel [phone] Pager [phone]  Note: Portions of this report may have been transcribed using voice recognition software.  Every effort has been made to ensure accuracy; however, inadvertent computerized transcription errors may still be present.

## 2022-06-16 ENCOUNTER — Other Ambulatory Visit: Payer: Self-pay

## 2022-06-16 DIAGNOSIS — N184 Chronic kidney disease, stage 4 (severe): Secondary | ICD-10-CM

## 2022-06-22 NOTE — Progress Notes (Signed)
  Care Coordination Note  06/22/2022 Name: Jennifer [name] MRN: [id number] DOB: 1955-08-24  Jennifer [name] is a 67 y.o. year old female who is a primary care patient of [name], [name], MD and is actively engaged with the care management team. I reached out to Drinda Butts by phone today to assist with re-scheduling a follow up visit with the RN Case Manager  Follow up plan: Telephone appointment with care management team member scheduled for: 07/14/2022  Julian Hy, [place] Direct Dial: [phone]

## 2022-06-24 ENCOUNTER — Encounter (HOSPITAL_COMMUNITY)
Admission: RE | Admit: 2022-06-24 | Discharge: 2022-06-24 | Disposition: A | Discharge status: Home / Self Care | Payer: Medicare Other | Source: Ambulatory Visit | Attending: Vascular Surgery | Admitting: Vascular Surgery

## 2022-06-24 VITALS — Ht 67.0 in | Wt 236.8 lb

## 2022-06-24 DIAGNOSIS — N184 Chronic kidney disease, stage 4 (severe): Secondary | ICD-10-CM

## 2022-06-28 ENCOUNTER — Ambulatory Visit (HOSPITAL_COMMUNITY)
Admission: RE | Admit: 2022-06-28 | Discharge: 2022-06-28 | Disposition: A | Discharge status: Home / Self Care | Payer: Medicare Other | Source: Ambulatory Visit | Attending: Vascular Surgery | Admitting: Vascular Surgery

## 2022-06-28 ENCOUNTER — Encounter (HOSPITAL_COMMUNITY)
Admission: RE | Disposition: A | Discharge status: Home / Self Care | Payer: Self-pay | Source: Ambulatory Visit | Attending: Vascular Surgery

## 2022-06-28 ENCOUNTER — Ambulatory Visit (HOSPITAL_BASED_OUTPATIENT_CLINIC_OR_DEPARTMENT_OTHER): Payer: Medicare Other | Admitting: Certified Registered Nurse Anesthetist

## 2022-06-28 ENCOUNTER — Encounter (HOSPITAL_COMMUNITY): Payer: Self-pay | Admitting: Vascular Surgery

## 2022-06-28 ENCOUNTER — Ambulatory Visit (HOSPITAL_COMMUNITY): Payer: Medicare Other | Admitting: Certified Registered Nurse Anesthetist

## 2022-06-28 ENCOUNTER — Other Ambulatory Visit: Payer: Self-pay

## 2022-06-28 DIAGNOSIS — Z87891 Personal history of nicotine dependence: Secondary | ICD-10-CM | POA: Insufficient documentation

## 2022-06-28 DIAGNOSIS — Z8673 Personal history of transient ischemic attack (TIA), and cerebral infarction without residual deficits: Secondary | ICD-10-CM | POA: Diagnosis not present

## 2022-06-28 DIAGNOSIS — I252 Old myocardial infarction: Secondary | ICD-10-CM | POA: Diagnosis not present

## 2022-06-28 DIAGNOSIS — Z794 Long term (current) use of insulin: Secondary | ICD-10-CM | POA: Insufficient documentation

## 2022-06-28 DIAGNOSIS — D631 Anemia in chronic kidney disease: Secondary | ICD-10-CM | POA: Insufficient documentation

## 2022-06-28 DIAGNOSIS — I130 Hypertensive heart and chronic kidney disease with heart failure and stage 1 through stage 4 chronic kidney disease, or unspecified chronic kidney disease: Secondary | ICD-10-CM | POA: Insufficient documentation

## 2022-06-28 DIAGNOSIS — J449 Chronic obstructive pulmonary disease, unspecified: Secondary | ICD-10-CM | POA: Diagnosis not present

## 2022-06-28 DIAGNOSIS — Z955 Presence of coronary angioplasty implant and graft: Secondary | ICD-10-CM | POA: Diagnosis not present

## 2022-06-28 DIAGNOSIS — I509 Heart failure, unspecified: Secondary | ICD-10-CM | POA: Diagnosis not present

## 2022-06-28 DIAGNOSIS — N184 Chronic kidney disease, stage 4 (severe): Secondary | ICD-10-CM | POA: Diagnosis not present

## 2022-06-28 DIAGNOSIS — I2510 Atherosclerotic heart disease of native coronary artery without angina pectoris: Secondary | ICD-10-CM | POA: Insufficient documentation

## 2022-06-28 DIAGNOSIS — E1122 Type 2 diabetes mellitus with diabetic chronic kidney disease: Secondary | ICD-10-CM | POA: Insufficient documentation

## 2022-06-28 DIAGNOSIS — E039 Hypothyroidism, unspecified: Secondary | ICD-10-CM | POA: Diagnosis not present

## 2022-06-28 HISTORY — PX: AV FISTULA PLACEMENT: SHX1204

## 2022-06-28 LAB — BASIC METABOLIC PANEL
Anion gap: 11 (ref 5–15)
BUN: 50 mg/dL — ABNORMAL HIGH (ref 8–23)
CO2: 22 mmol/L (ref 22–32)
Calcium: 8.2 mg/dL — ABNORMAL LOW (ref 8.9–10.3)
Chloride: 102 mmol/L (ref 98–111)
Creatinine, Ser: 3.33 mg/dL — ABNORMAL HIGH (ref 0.44–1.00)
GFR, Estimated: 15 mL/min — ABNORMAL LOW (ref >60)
Glucose, Bld: 201 mg/dL — ABNORMAL HIGH (ref 70–99)
Potassium: 3.9 mmol/L (ref 3.5–5.1)
Sodium: 135 mmol/L (ref 135–145)

## 2022-06-28 LAB — HEMOGLOBIN AND HEMATOCRIT, BLOOD
HCT: 30.5 % — ABNORMAL LOW (ref 36.0–46.0)
Hemoglobin: 10 g/dL — ABNORMAL LOW (ref 12.0–15.0)

## 2022-06-28 LAB — GLUCOSE, CAPILLARY: Glucose-Capillary: 182 mg/dL — ABNORMAL HIGH (ref 70–99)

## 2022-06-28 SURGERY — ARTERIOVENOUS (AV) FISTULA CREATION
Anesthesia: General | Site: Arm Upper | Laterality: Right

## 2022-06-28 MED ORDER — ONDANSETRON HCL 4 MG/2ML IJ SOLN
INTRAMUSCULAR | Status: DC | PRN
  Administered 2022-06-28: 4 mg via INTRAVENOUS

## 2022-06-28 MED ORDER — OXYCODONE HCL 5 MG/5ML PO SOLN: 5.0000 mg | Freq: Once | ORAL | Status: DC | PRN

## 2022-06-28 MED ORDER — CHLORHEXIDINE GLUCONATE 4 % EX LIQD: 60.0000 mL | Freq: Once | CUTANEOUS | Status: DC

## 2022-06-28 MED ORDER — OXYCODONE HCL 5 MG PO TABS: 5.0000 mg | ORAL_TABLET | Freq: Once | ORAL | Status: DC | PRN

## 2022-06-28 MED ORDER — LIDOCAINE-EPINEPHRINE 0.5 %-1:200000 IJ SOLN
INTRAMUSCULAR | Status: DC | PRN
  Administered 2022-06-28: 7 mL

## 2022-06-28 MED ORDER — FENTANYL CITRATE (PF) 100 MCG/2ML IJ SOLN
INTRAMUSCULAR | Status: AC
  Filled 2022-06-28: qty 2

## 2022-06-28 MED ORDER — ORAL CARE MOUTH RINSE: 15.0000 mL | Freq: Once | OROMUCOSAL | Status: AC

## 2022-06-28 MED ORDER — MIDAZOLAM HCL 5 MG/5ML IJ SOLN
INTRAMUSCULAR | Status: DC | PRN
  Administered 2022-06-28: 1 mg via INTRAVENOUS
  Administered 2022-06-28: .5 mg via INTRAVENOUS

## 2022-06-28 MED ORDER — CEFAZOLIN SODIUM-DEXTROSE 2-4 GM/100ML-% IV SOLN
2.0000 g | INTRAVENOUS | Status: AC
  Administered 2022-06-28: 2 g via INTRAVENOUS
  Filled 2022-06-28: qty 100

## 2022-06-28 MED ORDER — EPHEDRINE SULFATE (PRESSORS) 50 MG/ML IJ SOLN
INTRAMUSCULAR | Status: DC | PRN
  Administered 2022-06-28: 5 mg via INTRAVENOUS

## 2022-06-28 MED ORDER — 0.9 % SODIUM CHLORIDE (POUR BTL) OPTIME
TOPICAL | Status: DC | PRN
  Administered 2022-06-28: 1000 mL

## 2022-06-28 MED ORDER — PROPOFOL 500 MG/50ML IV EMUL
INTRAVENOUS | Status: DC | PRN
  Administered 2022-06-28: 25 ug/kg/min via INTRAVENOUS
  Administered 2022-06-28: 30 mg via INTRAVENOUS
  Administered 2022-06-28: 10 mg via INTRAVENOUS
  Administered 2022-06-28: 40 mg via INTRAVENOUS

## 2022-06-28 MED ORDER — HEPARIN 6000 UNIT IRRIGATION SOLUTION
Status: DC | PRN
  Administered 2022-06-28: 1

## 2022-06-28 MED ORDER — LIDOCAINE-EPINEPHRINE 0.5 %-1:200000 IJ SOLN
INTRAMUSCULAR | Status: AC
  Filled 2022-06-28: qty 1

## 2022-06-28 MED ORDER — CHLORHEXIDINE GLUCONATE 0.12 % MT SOLN
15.0000 mL | Freq: Once | OROMUCOSAL | Status: AC
  Administered 2022-06-28: 15 mL via OROMUCOSAL

## 2022-06-28 MED ORDER — MIDAZOLAM HCL 2 MG/2ML IJ SOLN
INTRAMUSCULAR | Status: AC
  Filled 2022-06-28: qty 2

## 2022-06-28 MED ORDER — SODIUM CHLORIDE 0.9 % IV SOLN: INTRAVENOUS | Status: DC

## 2022-06-28 MED ORDER — HEPARIN SODIUM (PORCINE) 1000 UNIT/ML IJ SOLN
INTRAMUSCULAR | Status: AC
  Filled 2022-06-28: qty 6

## 2022-06-28 MED ORDER — FENTANYL CITRATE PF 50 MCG/ML IJ SOSY: 25.0000 ug | PREFILLED_SYRINGE | INTRAMUSCULAR | Status: DC | PRN

## 2022-06-28 MED ORDER — ONDANSETRON HCL 4 MG/2ML IJ SOLN: 4.0000 mg | Freq: Once | INTRAMUSCULAR | Status: DC | PRN

## 2022-06-28 MED ORDER — SODIUM CHLORIDE 0.9 % IV SOLN: Freq: Once | INTRAVENOUS | Status: DC

## 2022-06-28 MED ORDER — FENTANYL CITRATE (PF) 100 MCG/2ML IJ SOLN
INTRAMUSCULAR | Status: DC | PRN
  Administered 2022-06-28 (×3): 25 ug via INTRAVENOUS

## 2022-06-28 MED ORDER — PROPOFOL 500 MG/50ML IV EMUL
INTRAVENOUS | Status: AC
  Filled 2022-06-28: qty 50

## 2022-06-28 MED ORDER — LIDOCAINE HCL (CARDIAC) PF 100 MG/5ML IV SOSY
PREFILLED_SYRINGE | INTRAVENOUS | Status: DC | PRN
  Administered 2022-06-28: 60 mg via INTRAVENOUS

## 2022-06-28 MED ORDER — OXYCODONE-ACETAMINOPHEN 5-325 MG PO TABS: 1.0000 | ORAL_TABLET | Freq: Four times a day (QID) | ORAL | 0 refills | Status: DC | PRN

## 2022-06-28 MED ORDER — SODIUM CHLORIDE 0.9 % IV SOLN: INTRAVENOUS | Status: DC | PRN

## 2022-06-28 SURGICAL SUPPLY — 35 items
ADH SKN CLS APL DERMABOND .7 (GAUZE/BANDAGES/DRESSINGS) ×1
ARMBAND PINK RESTRICT EXTREMIT (MISCELLANEOUS) ×1 IMPLANT
BAG HAMPER (MISCELLANEOUS) ×1 IMPLANT
CANNULA VESSEL 3MM 2 BLNT TIP (CANNULA) ×1 IMPLANT
CLIP LIGATING EXTRA MED SLVR (CLIP) ×1 IMPLANT
CLIP LIGATING EXTRA SM BLUE (MISCELLANEOUS) ×1 IMPLANT
COVER LIGHT HANDLE STERIS (MISCELLANEOUS) ×2 IMPLANT
COVER MAYO STAND XLG (MISCELLANEOUS) ×1 IMPLANT
DERMABOND ADVANCED .7 DNX12 (GAUZE/BANDAGES/DRESSINGS) ×1 IMPLANT
ELECT REM PT RETURN 9FT ADLT (ELECTROSURGICAL) ×1
ELECTRODE REM PT RTRN 9FT ADLT (ELECTROSURGICAL) ×1 IMPLANT
GAUZE SPONGE 4X4 12PLY STRL (GAUZE/BANDAGES/DRESSINGS) ×1 IMPLANT
GLOVE BIOGEL PI IND STRL 7.0 (GLOVE) ×2 IMPLANT
GLOVE SURG MICRO LTX SZ7.5 (GLOVE) ×1 IMPLANT
GOWN STRL REUS W/TWL LRG LVL3 (GOWN DISPOSABLE) ×3 IMPLANT
IV NS 500ML (IV SOLUTION) ×1
IV NS 500ML BAXH (IV SOLUTION) ×1 IMPLANT
KIT BLADEGUARD II DBL (SET/KITS/TRAYS/PACK) ×1 IMPLANT
KIT TURNOVER KIT A (KITS) ×1 IMPLANT
MANIFOLD NEPTUNE II (INSTRUMENTS) ×1 IMPLANT
MARKER SKIN DUAL TIP RULER LAB (MISCELLANEOUS) ×2 IMPLANT
NDL HYPO 18GX1.5 BLUNT FILL (NEEDLE) ×1 IMPLANT
NEEDLE HYPO 18GX1.5 BLUNT FILL (NEEDLE) ×1 IMPLANT
NS IRRIG 1000ML POUR BTL (IV SOLUTION) ×1 IMPLANT
PACK CV ACCESS (CUSTOM PROCEDURE TRAY) ×1 IMPLANT
PAD ARMBOARD 7.5X6 YLW CONV (MISCELLANEOUS) ×1 IMPLANT
SET BASIN LINEN APH (SET/KITS/TRAYS/PACK) ×1 IMPLANT
SOL PREP POV-IOD 4OZ 10% (MISCELLANEOUS) ×1 IMPLANT
SOL PREP PROV IODINE SCRUB 4OZ (MISCELLANEOUS) ×1 IMPLANT
SPONGE T-LAP 18X18 ~~LOC~~+RFID (SPONGE) ×1 IMPLANT
SUT PROLENE 6 0 CC (SUTURE) ×1 IMPLANT
SUT VIC AB 3-0 SH 27 (SUTURE) ×1
SUT VIC AB 3-0 SH 27X BRD (SUTURE) ×1 IMPLANT
SYR 10ML LL (SYRINGE) ×1 IMPLANT
UNDERPAD 30X36 HEAVY ABSORB (UNDERPADS AND DIAPERS) ×1 IMPLANT

## 2022-06-28 NOTE — Op Note (Signed)
    OPERATIVE REPORT  DATE OF SURGERY: 06/28/2022  PATIENT: Jennifer [name], 67 y.o. female MRN: [id number]  DOB: 1955-10-27  PRE-OPERATIVE DIAGNOSIS: CKD 4  POST-OPERATIVE DIAGNOSIS:  Same  PROCEDURE: Right brachiocephalic AV fistula creation  SURGEON:  Curt Jews, M.D.  PHYSICIAN ASSISTANT: Fulton Mole, RNFA  The assistant was needed for exposure and to expedite the case  ANESTHESIA: MAC  EBL: per anesthesia record  Total I/O In: 800 [I.V.:700; IV Piggyback:100] Out: 5 [Blood:5]  BLOOD ADMINISTERED: none  DRAINS: none  SPECIMEN: none  COUNTS CORRECT:  YES  PATIENT DISPOSITION:  PACU - hemodynamically stable  PROCEDURE DETAILS: The patient was taken everyplace supine additionally the area of the right arm was prepped and draped in usual sterile fashion.  Using local anesthesia incision was made over the antecubital space and carried down to isolate the cephalic vein which was of large caliber.  The brachial artery was exposed through the same incision and it was also of large caliber with minimal atherosclerotic change.  The vein was mobilized proximally and distally and tributary branches were ligated with 3-0 silk ties and divided.  The vein was transected distally and was brought into approximation of the artery.  The vein was gently dilated with heparinized saline and was excellent size.  The brachial artery was occluded proximally and distally and was opened with an 11 blade and extended longitudinally with Potts scissors.  The vein was cut to the appropriate length and spatulated and sewn end-to-side to the artery with a running 6-0 Prolene suture.  Clamps were removed and excellent thrill was noted.  The patient did maintain a right radial pulse.  The wound was irrigated with saline.  Hemostasis was obtained with electrocautery.  The wound was closed with 3-0 Vicryl in the subcutaneous and subcuticular tissue.  Sterile dressing was applied and the patient was transferred  to the recovery room in stable condition   Rosetta Posner, M.D., [facility] 06/28/2022 8:50 AM  Note: Portions of this report may have been transcribed using voice recognition software.  Every effort has been made to ensure accuracy; however, inadvertent computerized transcription errors may still be present.

## 2022-06-28 NOTE — Anesthesia Preprocedure Evaluation (Signed)
Anesthesia Evaluation  Patient identified by MRN, date of birth, ID band Patient awake    Reviewed: Allergy & Precautions, H&P , NPO status , Patient's Chart, lab work & pertinent test results, reviewed documented beta blocker date and time   Airway Mallampati: II  TM Distance: >3 FB Neck ROM: full    Dental no notable dental hx.    Pulmonary neg pulmonary ROS, COPD, former smoker   Pulmonary exam normal breath sounds clear to auscultation       Cardiovascular Exercise Tolerance: Good hypertension, + CAD, + Past MI, + Cardiac Stents and +CHF   Rhythm:regular Rate:Normal     Neuro/Psych  Headaches PSYCHIATRIC DISORDERS       Neuromuscular disease CVA negative neurological ROS  negative psych ROS   GI/Hepatic negative GI ROS, Neg liver ROS,,,  Endo/Other  negative endocrine ROSdiabetesHypothyroidism    Renal/GU Renal diseasenegative Renal ROS  negative genitourinary   Musculoskeletal   Abdominal   Peds  Hematology negative hematology ROS (+) Blood dyscrasia, anemia   Anesthesia Other Findings   Reproductive/Obstetrics negative OB ROS                             Anesthesia Physical Anesthesia Plan  ASA: 3  Anesthesia Plan: General   Post-op Pain Management:    Induction:   PONV Risk Score and Plan: Propofol infusion  Airway Management Planned:   Additional Equipment:   Intra-op Plan:   Post-operative Plan:   Informed Consent: I have reviewed the patients History and Physical, chart, labs and discussed the procedure including the risks, benefits and alternatives for the proposed anesthesia with the patient or authorized representative who has indicated his/her understanding and acceptance.     Dental Advisory Given  Plan Discussed with: CRNA  Anesthesia Plan Comments:        Anesthesia Quick Evaluation

## 2022-06-28 NOTE — Transfer of Care (Signed)
Immediate Anesthesia Transfer of Care Note  Patient: Jennifer [name]  Procedure(s) Performed: RIGHT ARM ARTERIOVENOUS (AV) FISTULA CREATION (Right: Arm Upper)  Patient Location: PACU  Anesthesia Type:MAC  Level of Consciousness: drowsy and patient cooperative  Airway & Oxygen Therapy: Patient Spontanous Breathing and Patient connected to face mask oxygen  Post-op Assessment: Report given to RN and Post -op Vital signs reviewed and stable  Post vital signs: Reviewed and stable  Last Vitals:  Vitals Value Taken Time  BP 157/66 06/28/22 0849  Temp    Pulse 67 06/28/22 0851  Resp 19 06/28/22 0851  SpO2 100 % 06/28/22 0850  Vitals shown include unvalidated device data.  Last Pain:  Vitals:   06/28/22 0659  TempSrc: Oral  PainSc:       Patients Stated Pain Goal: 5 (16/10/96 0454)  Complications: No notable events documented.

## 2022-06-28 NOTE — Interval H&P Note (Signed)
History and Physical Interval Note:  06/28/2022 7:18 AM  Jennifer [name]  has presented today for surgery, with the diagnosis of CKD IV.  The various methods of treatment have been discussed with the patient and family. After consideration of risks, benefits and other options for treatment, the patient has consented to  Procedure(s): RIGHT ARM ARTERIOVENOUS (AV) FISTULA CREATION (Right) as a surgical intervention.  The patient's history has been reviewed, patient examined, no change in status, stable for surgery.  I have reviewed the patient's chart and labs.  Questions were answered to the patient's satisfaction.     Curt Jews

## 2022-06-28 NOTE — Discharge Instructions (Signed)
Vascular and Vein Specialists of [facility]  Discharge Instructions  AV Fistula or Graft Surgery for Dialysis Access  Please refer to the following instructions for your post-procedure care. Your surgeon or physician assistant will discuss any changes with you.  Activity  You may drive the day following your surgery, if you are comfortable and no longer taking prescription pain medication. Resume full activity as the soreness in your incision resolves.  Bathing/Showering  You may shower after you go home. Keep your incision dry for 48 hours. Do not soak in a bathtub, hot tub, or swim until the incision heals completely. You may not shower if you have a hemodialysis catheter.  Incision Care  Clean your incision with mild soap and water after 48 hours. Pat the area dry with a clean towel. You do not need a bandage unless otherwise instructed. Do not apply any ointments or creams to your incision. You may have skin glue on your incision. Do not peel it off. It will come off on its own in about one week. Your arm may swell a bit after surgery. To reduce swelling use pillows to elevate your arm so it is above your heart. Your doctor will tell you if you need to lightly wrap your arm with an ACE bandage.  Diet  Resume your normal diet. There are not special food restrictions following this procedure. In order to heal from your surgery, it is CRITICAL to get adequate nutrition. Your body requires vitamins, minerals, and protein. Vegetables are the best source of vitamins and minerals. Vegetables also provide the perfect balance of protein. Processed food has little nutritional value, so try to avoid this.  Medications  Resume taking all of your medications. If your incision is causing pain, you may take over-the counter pain relievers such as acetaminophen (Tylenol). If you were prescribed a stronger pain medication, please be aware these medications can cause nausea and constipation. Prevent  nausea by taking the medication with a snack or meal. Avoid constipation by drinking plenty of fluids and eating foods with high amount of fiber, such as fruits, vegetables, and grains.  Do not take Tylenol if you are taking prescription pain medications.  Follow up Your surgeon may want to see you in the office following your access surgery. If so, this will be arranged at the time of your surgery.  Please call us immediately for any of the following conditions:  Increased pain, redness, drainage (pus) from your incision site Fever of 101 degrees or higher Severe or worsening pain at your incision site Hand pain or numbness.  Reduce your risk of vascular disease:  Stop smoking. If you would like help, call [facility] at 1-800-QUIT-NOW 850-773-8868) or [place] at [place] your cholesterol Maintain a desired weight Control your diabetes Keep your blood pressure down  Dialysis  It will take several weeks to several months for your new dialysis access to be ready for use. Your surgeon will determine when it is okay to use it. Your nephrologist will continue to direct your dialysis. You can continue to use your Permcath until your new access is ready for use.   06/28/2022 Jennifer [name] [id number] [birth year]  Surgeon(s): Avaiah Stempel, Arvilla Meres, MD  Procedure(s): RIGHT ARM ARTERIOVENOUS (AV) FISTULA CREATION   May stick graft immediately   May stick graft on designated area only:    Do not stick fistula for 12 weeks    If you have any questions, please call the office at [phone].

## 2022-06-29 NOTE — Anesthesia Postprocedure Evaluation (Signed)
Anesthesia Post Note  Patient: Jennifer [name]  Procedure(s) Performed: RIGHT ARM ARTERIOVENOUS (AV) FISTULA CREATION (Right: Arm Upper)  Patient location during evaluation: Phase II Anesthesia Type: General Level of consciousness: awake Pain management: pain level controlled Vital Signs Assessment: post-procedure vital signs reviewed and stable Respiratory status: spontaneous breathing and respiratory function stable Cardiovascular status: blood pressure returned to baseline and stable Postop Assessment: no headache and no apparent nausea or vomiting Anesthetic complications: no Comments: Late entry   No notable events documented.   Last Vitals:  Vitals:   06/28/22 0925 06/28/22 0938  BP:  (!) 184/90  Pulse:  68  Resp:  16  Temp:  36.6 C  SpO2: 95% 94%    Last Pain:  Vitals:   06/29/22 0948  TempSrc:   PainSc: 0-No pain                 Louann Sjogren

## 2022-07-04 ENCOUNTER — Encounter (HOSPITAL_COMMUNITY): Payer: Self-pay | Admitting: Vascular Surgery

## 2022-07-14 ENCOUNTER — Ambulatory Visit: Payer: Self-pay

## 2022-07-14 ENCOUNTER — Telehealth: Payer: Self-pay | Admitting: Family Medicine

## 2022-07-14 NOTE — Patient Outreach (Addendum)
  Care Coordination   Follow Up Visit Note   07/14/2022 Name: Jennifer [name] MRN: [id number] DOB: Dec 07, 1955  Jennifer [name] is a 67 y.o. year old female who sees [name], [name], MD for primary care. I  spoke with daughter Charnele Semple  What matters to the patients health and wellness today?  Referral to neurology and ongoing management of patients health conditions.     Goals Addressed             This Visit's Progress    Patient / caregiver statement:  Management of health conditions       Care Coordination Interventions: Evaluation of current treatment plan status  and patient's adherence to plan as established by provider: Daughter states patient received hemodialysis access on 06/28/22 due to ongoing abnormal kidney function/ labs.  Daughter states patient has follow up regarding new access on 08/03/22.  Daughter states patients memory has gotten worse. She states patient has not seen neurologist because she needs a referral. Request RNCM send message to primary provider requesting referral.  Daughter states patients eating has gotten better and denies issues with choking.  Reviewed medications with patient's caregiver and discussed importance of compliance.  Reviewed scheduled/upcoming provider appointments:  Daughter states patient has follow up visit with vascular doctor on 08/03/22 and follow up with nephrology this month.  She states she will call nephrology office to determine exact appointment date.  Discussed plans with caregiver for ongoing care management follow up: Daughter agreed to next telephone outreach with [facility] on 08/24/22.   Advised daughter that message will be sent to primary provider regarding neurology referral request. Informed daughter that patient has not been seen by primary since August 2023 therefore primary care provider may want to see patient prior to referring. .  Message sent to Dr. Glori Bickers regarding neurology referral request          SDOH assessments and  interventions completed:  No     Care Coordination Interventions:  Yes, provided   Follow up plan: Follow up call scheduled for 08/24/22    Encounter Outcome:  Pt. Visit Completed   Quinn Plowman RN,BSN,CCM [place] [phone] direct line

## 2022-07-14 NOTE — Telephone Encounter (Signed)
Good morning Dr. Glori Bickers,  I spoke with the daughter/ caregiver of Ms. Haselton this morning.  The daughter is requesting a neurology referral for patient due to worsening memory issues. When reviewing the chart it looks like Ms. Mongillo has not been seen in your office since August 2023.  I explained to her that you may want to see patient to discuss any new findings and referral request. Please advise and let me know if I can be of further assistance.   Have a great day.   Quinn Plowman RN,BSN,CCM  [place]  [phone] direct line    In the past some of her mental status change was due to organic medical problems also Please schedule f/u with me  We will talk /examine her first before referring (but I have no problem laying the referral-just need to do some groundwork)   Thanks

## 2022-07-14 NOTE — Telephone Encounter (Signed)
-----  Message from Dannielle Karvonen, RN sent at 07/14/2022 10:56 AM EST ----- Regarding: patient request/ update Good morning Dr. Glori Bickers,  I spoke with the daughter/ caregiver of Ms. Cariker this morning.  The daughter is requesting a neurology referral for patient due to worsening memory issues. When reviewing the chart it looks like Ms. Bejarano has not been seen in your office since August 2023.  I explained to her that you may want to see patient to discuss any new findings and referral request. Please advise and let me know if I can be of further assistance.   Have a great day.  Quinn Plowman RN,BSN,CCM [place] Coordination [phone] direct line

## 2022-07-15 NOTE — Telephone Encounter (Signed)
Daughter notified of Dr. Marliss Coots comments and verbalized understanding. F/u appt scheduled

## 2022-07-26 ENCOUNTER — Ambulatory Visit (INDEPENDENT_AMBULATORY_CARE_PROVIDER_SITE_OTHER): Payer: Medicare Other | Admitting: Family Medicine

## 2022-07-26 ENCOUNTER — Encounter: Payer: Self-pay | Admitting: Family Medicine

## 2022-07-26 VITALS — BP 146/62 | HR 76 | Temp 97.6°F | Ht 67.0 in | Wt 223.4 lb

## 2022-07-26 DIAGNOSIS — N184 Chronic kidney disease, stage 4 (severe): Secondary | ICD-10-CM | POA: Diagnosis not present

## 2022-07-26 DIAGNOSIS — R413 Other amnesia: Secondary | ICD-10-CM

## 2022-07-26 DIAGNOSIS — Z23 Encounter for immunization: Secondary | ICD-10-CM | POA: Diagnosis not present

## 2022-07-26 DIAGNOSIS — I10 Essential (primary) hypertension: Secondary | ICD-10-CM

## 2022-07-26 DIAGNOSIS — R410 Disorientation, unspecified: Secondary | ICD-10-CM

## 2022-07-26 DIAGNOSIS — R440 Auditory hallucinations: Secondary | ICD-10-CM

## 2022-07-26 NOTE — Assessment & Plan Note (Addendum)
In setting of some major medical problems (kidney failure/heart failure) pt has exp some auditory hallucinations at night  These are not bothersome to her and she does not react (aware they are not real) Denies anx or depression  Going through some major medical changes with renal failure and CHF recently  May be multifactorial in setting of these changes  Cognitively not much change today  MMSE today was actually improved today with score of 28 out of 30  Fairly mentally sharp   Offered urinalysis to r/o uti but pt notes she just had nl one at neph office and cannot give sample today  Consider checking this if symptoms worsen  Reviewed recent nephrology records today as well as extensive recent labs  This warrants close f/u  Does not desire neuro or psych ref at this time  Will continue to closely watch labs/chem with nephrology  Asked pt and family to make me aware if symptoms worsen

## 2022-07-26 NOTE — Patient Instructions (Signed)
Watch for any signs of a uti  If so - come and leave a sample   Let's keep an eye on your cognition and mental status  If the hallucinations stay mild and don't bother you we can just observe  If worse or worse confusion, wandering , agitation - please let us know  If feel very depressed let us know   You brief memory /cognition test today was pretty good Very reassuring

## 2022-07-26 NOTE — Assessment & Plan Note (Signed)
Some night time auditory hallucinations Not bothersome to pt

## 2022-07-26 NOTE — Progress Notes (Signed)
Subjective:    Patient ID: Jennifer [name], female    DOB: 1955/09/10, 67 y.o.   MRN: [id number]  HPI Pt presents with cognitive concerns   Wt Readings from Last 3 Encounters:  07/26/22 223 lb 6 oz (101.3 kg)  06/24/22 236 lb 12.4 oz (107.4 kg)  06/15/22 236 lb 12.8 oz (107.4 kg)   34.99 kg/m  Vitals:   07/26/22 1045  BP: (!) 150/54  Pulse: 76  Temp: 97.6 F (36.4 C)  SpO2: 97%      In the past pt had some cognitive problems in response to poor nutrition and fluids  MMSE score in past was 27   (a year ago)  Thinks she is talking to her mother (who is deceased) , and father  More than a month or two  Happens in the evening   Not a lot of change in memory  Gets confused easily   Has not been on abx for anything  Has frequent urination with incontinence -this is her baseline  No burning and no blood in urine   Family notes some moodiness but this is baseline  Otherwise mood is baseline   Bought some prevagen- has not been taking it   No changes in thyroid  Good appetite, is always hungry  Had one low blood sugar  Per pt not well controlled   Takes cymbalta   Did not take the oxycodone after the vasc surgery    Sees endocrinology for thyroid   Renal disease Lab Results  Component Value Date   CREATININE 3.33 (H) 06/28/2022   BUN 50 (H) 06/28/2022   NA 135 06/28/2022   K 3.9 06/28/2022   CL 102 06/28/2022   CO2 22 06/28/2022   GFR of 15   Had a fistula put in for dialysis   MMSE today score 28/30  Got the year wrong  Missed a letter when spelling world backwards    Lab Results  Component Value Date   WBC 13.4 (H) 03/25/2022   HGB 10.0 (L) 06/28/2022   HCT 30.5 (L) 06/28/2022   MCV 85.4 03/25/2022   PLT 351 03/25/2022   Patient Active Problem List   Diagnosis Date Noted   Auditory hallucination 07/26/2022   Carotid artery stenosis, asymptomatic, left 05/04/2022   Anemia of chronic disease 03/23/2022   Hyponatremia 03/23/2022    Hyperkalemia 03/23/2022   Dehydration 03/23/2022   Chronic diastolic CHF (congestive heart failure) ([place]) 03/23/2022   SBO (small bowel obstruction) ([place]) 03/22/2022   Closed left ankle fracture 01/26/2022   Type 2 diabetes mellitus with hyperglycemia ([place]) 01/07/2022   Acute kidney injury superimposed on chronic kidney disease ([place]) 11/19/2021   Frequent urination 07/13/2021   Change in mental status 07/13/2021   Headache 07/13/2021   Gait abnormality 07/13/2021   Memory loss 07/13/2021   Abnormal urinalysis 07/13/2021   CKD (chronic kidney disease) stage 4, GFR 15-29 ml/min (HCC) 07/13/2021   Normocytic anemia 07/13/2021   Diabetic nephropathy ([place]) 05/28/2020   Diabetic retinopathy ([place]) 05/28/2020   Presence of insulin pump (external) (internal) 05/28/2020   Diabetic macular edema of right eye with proliferative retinopathy associated with type 2 diabetes mellitus ([place]) 04/16/2020   Macular pucker, right eye 04/16/2020   Vitreous hemorrhage of left eye ([place]) 04/16/2020   Pre-op examination 03/13/2020   Chronic venous insufficiency 02/06/2020   Constipation 05/28/2019   S/P TKR (total knee replacement), right 03/23/2019 05/07/2019   Primary osteoarthritis of right knee 04/02/2019   Unilateral  primary osteoarthritis, right knee    Pedal edema 02/25/2019   Acute vertigo with vomiting and inability to stand 10/08/2018   Uncontrolled type 2 diabetes mellitus with hyperglycemia, with long-term current use of insulin ([place]) 10/08/2018   Former smoker 10/08/2018   Carotid artery disease ([place]) 03/15/2018   History of CVA (cerebrovascular accident) 11/16/2017   Low back pain 10/26/2017   Screening mammogram, encounter for 09/18/2017   Coronary artery disease involving native coronary artery of native heart without angina pectoris 11/04/2015   NSTEMI (non-ST elevated myocardial infarction) ([place]) 11/03/2015   Leukocytosis 08/16/2010   Acquired hypothyroidism 07/06/2010   HLD  (hyperlipidemia) 08/28/2008   Essential hypertension 11/13/2007   Proliferative diabetic retinopathy of left eye with macular edema associated with type 2 diabetes mellitus ([place]) 09/21/2006   Diabetic peripheral neuropathy ([place]) 09/21/2006   Obesity (BMI 30-39.9) 09/21/2006   Adjustment disorder with mixed anxiety and depressed mood 09/21/2006   CARPAL TUNNEL SYNDROME 09/21/2006   COPD (chronic obstructive pulmonary disease) ([place]) 09/21/2006   EDEMA 09/21/2006   MIGRAINES, HX OF 09/21/2006   Past Medical History:  Diagnosis Date   CAD (coronary artery disease)    cath 5/23 100% dist RCA lesion treated with 2 overlapping Integrity Resolute DES ( 2.25x71m, 2.25x149m, 60% mid RCA treated medically, 99% OM2 not amenable to PCI, 70% D1 lesion, EF normal   Hypercholesteremia    Hypertension    Hypothyroidism    Kidney disease    Renal disorder    Stroke ([facility]2020   Thyroid disease    Type 2 diabetes mellitus ([facility]/27/2020   Past Surgical History:  Procedure Laterality Date   ABDOMINAL HYSTERECTOMY     AV FISTULA PLACEMENT Right 06/28/2022   Procedure: RIGHT ARM ARTERIOVENOUS (AV) FISTULA CREATION;  Surgeon: EaRosetta PosnerMD;  Location: [facility];  Service: Vascular;  Laterality: Right;   CARDIAC CATHETERIZATION N/A 11/03/2015   Procedure: Left Heart Cath and Coronary Angiography;  Surgeon: DaLeonie ManMD;  [facility];  Service: Cardiovascular;  Laterality: N/A;   CARDIAC CATHETERIZATION N/A 11/03/2015   Procedure: Coronary Stent Intervention;  Surgeon: DaLeonie ManMD;  [facility];  Service: Cardiovascular;  Laterality: N/A;   CARPAL TUNNEL RELEASE     CORONARY ANGIOPLASTY     1 stent   TOTAL KNEE ARTHROPLASTY Right 04/02/2019   Procedure: TOTAL KNEE ARTHROPLASTY;  Surgeon: HaCarole CivilMD;  Location: [facility];  Service: Orthopedics;  Laterality: Right;   Social History   Tobacco Use   Smoking status: Former    Packs/day:  0.25    Types: Cigarettes    Quit date: 05/13/2021    Years since quitting: 1.2   Smokeless tobacco: Never  Vaping Use   Vaping Use: Never used  Substance Use Topics   Alcohol use: No    Alcohol/week: 0.0 standard drinks of alcohol   Drug use: No   Family History  Problem Relation Age of Onset   Heart disease Mother    Stroke Sister    Heart attack Brother    Breast cancer Neg Hx    Allergies  Allergen Reactions   Metformin Hcl Other (See Comments)    Unknown reaction    Current Outpatient Medications on File Prior to Visit  Medication Sig Dispense Refill   amLODipine (NORVASC) 10 MG tablet Take 1 tablet by mouth once daily 90 tablet 3   aspirin EC 81 MG tablet Take 1 tablet (81 mg  total) by mouth 2 (two) times daily. Hold aspirin while you are on apixaban/Eliquis 1 tablet 0   atorvastatin (LIPITOR) 80 MG tablet TAKE 1 TABLET BY MOUTH ONCE DAILY 6 IN THE EVENING 30 tablet 6   BD PEN NEEDLE NANO 2ND GEN 32G X 4 MM MISC Inject into the skin 3 (three) times daily.     buPROPion (WELLBUTRIN SR) 150 MG 12 hr tablet Take 1 tablet (150 mg total) by mouth 2 (two) times daily. 180 tablet 3   carvedilol (COREG) 12.5 MG tablet Take 1 tablet (12.5 mg total) by mouth 2 (two) times daily with a meal. 60 tablet 6   DULoxetine (CYMBALTA) 60 MG capsule Take 1 capsule (60 mg total) by mouth daily. 90 capsule 3   ferrous sulfate 325 (65 FE) MG tablet Take 325 mg by mouth 2 (two) times daily with a meal.     furosemide (LASIX) 40 MG tablet Take 2 tablets (80 mg total) by mouth daily. Once a day (Patient taking differently: Take 40-80 mg by mouth See admin instructions. Take 80 mg in the morning and 40 mg at night) 30 tablet    HUMULIN 70/30 KWIKPEN (70-30) 100 UNIT/ML KwikPen Inject 15 units in the morning and if she eats 10 units at lunch and 15 units with supper meal 15 mL 11   insulin lispro protamine-lispro (HUMALOG 75/25 MIX) (75-25) 100 UNIT/ML SUSP injection Inject 10-25 Units into the skin See  admin instructions. Inject 25 units with breakfast, 10 units at lunch, 25 units at dinner     levothyroxine (SYNTHROID) 150 MCG tablet Take 150 mcg by mouth daily.     nitroGLYCERIN (NITROSTAT) 0.4 MG SL tablet Place 1 tablet (0.4 mg total) under the tongue every 5 (five) minutes x 3 doses as needed for chest pain (If no relief after 3rd dose, call 911 or go to ED.). 90 tablet 3   oxyCODONE-acetaminophen (PERCOCET) 5-325 MG tablet Take 1 tablet by mouth every 6 (six) hours as needed for severe pain. 8 tablet 0   potassium chloride SA (KLOR-CON M) 20 MEQ tablet Take 20 mEq by mouth daily.     sodium bicarbonate 650 MG tablet Take 1 tablet (650 mg total) by mouth 2 (two) times daily. 20 tablet 0   VITAMIN D PO Take 1 capsule by mouth daily.     No current facility-administered medications on file prior to visit.     Review of Systems  Constitutional:  Positive for fatigue. Negative for activity change, appetite change, fever and unexpected weight change.  HENT:  Negative for congestion, ear pain, rhinorrhea, sinus pressure and sore throat.   Eyes:  Negative for pain, redness and visual disturbance.  Respiratory:  Negative for cough, shortness of breath and wheezing.   Cardiovascular:  Negative for chest pain and palpitations.  Gastrointestinal:  Negative for abdominal pain, blood in stool, constipation and diarrhea.  Endocrine: Negative for polydipsia and polyuria.  Genitourinary:  Negative for dysuria, frequency and urgency.  Musculoskeletal:  Negative for arthralgias, back pain and myalgias.  Skin:  Negative for pallor and rash.  Allergic/Immunologic: Negative for environmental allergies.  Neurological:  Negative for dizziness, syncope and headaches.  Hematological:  Negative for adenopathy. Does not bruise/bleed easily.  Psychiatric/Behavioral:  Positive for confusion and hallucinations. Negative for agitation, behavioral problems, decreased concentration, dysphoric mood, self-injury, sleep  disturbance and suicidal ideas. The patient is not nervous/anxious.        Objective:   Physical Exam Constitutional:  General: She is not in acute distress.    Appearance: Normal appearance. She is well-developed. She is obese. She is not diaphoretic.     Comments: Chronically ill appearing   HENT:     Head: Normocephalic and atraumatic.     Mouth/Throat:     Mouth: Mucous membranes are moist.  Eyes:     Conjunctiva/sclera: Conjunctivae normal.     Pupils: Pupils are equal, round, and reactive to light.  Neck:     Thyroid: No thyromegaly.     Vascular: No carotid bruit or JVD.  Cardiovascular:     Rate and Rhythm: Normal rate and regular rhythm.     Heart sounds: Normal heart sounds.     No gallop.     Comments: Carotid bruit Pulmonary:     Effort: Pulmonary effort is normal. No respiratory distress.     Breath sounds: Normal breath sounds. No stridor. No wheezing, rhonchi or rales.     Comments: Diffusely distant bs  Abdominal:     General: There is no distension or abdominal bruit.     Palpations: Abdomen is soft.  Musculoskeletal:     Cervical back: Normal range of motion and neck supple.     Right lower leg: No edema.     Left lower leg: No edema.  Lymphadenopathy:     Cervical: No cervical adenopathy.  Skin:    General: Skin is warm and dry.     Coloration: Skin is not jaundiced or pale.     Findings: No rash.  Neurological:     Mental Status: She is alert.     Cranial Nerves: No cranial nerve deficit.     Sensory: No sensory deficit.     Coordination: Coordination normal.     Deep Tendon Reflexes: Reflexes are normal and symmetric. Reflexes normal.  Psychiatric:        Attention and Perception: Attention normal.        Mood and Affect: Mood normal. Mood is not anxious or depressed. Affect is blunt. Affect is not tearful.        Speech: Speech is not rapid and pressured or delayed.        Behavior: Behavior normal.        Thought Content: Thought  content is not paranoid or delusional. Thought content does not include suicidal ideation.     Comments: Affect is mildly blunted  Does not make good eye contact but is attentive Answers questions appropriately   MMSE score of 28 out of 30 Missed the year and one letter of World backwards   Short term memory seems baseline   Not confused today  Pleasant            Assessment & Plan:   Problem List Items Addressed This Visit       Cardiovascular and Mediastinum   Essential hypertension (Chronic)    Re check bp was better BP: (!) 146/62   Will f/u with nephrology for further management         Genitourinary   CKD (chronic kidney disease) stage 4, GFR 15-29 ml/min ([place])    Has had surg for vasc access for dialysis  GFR last here 15   No urinary symptoms  Was unable to give sample today        Other   Auditory hallucination - Primary    In setting of some major medical problems (kidney failure/heart failure) pt has exp some auditory hallucinations at night  These are  not bothersome to her and she does not react (aware they are not real) Denies anx or depression  Going through some major medical changes with renal failure and CHF recently  May be multifactorial in setting of these changes  Cognitively not much change today  MMSE today was actually improved today with score of 28 out of 30  Fairly mentally sharp   Offered urinalysis to r/o uti but pt notes she just had nl one at neph office and cannot give sample today  Consider checking this if symptoms worsen  Reviewed recent nephrology records today as well as extensive recent labs  This warrants close f/u  Does not desire neuro or psych ref at this time  Will continue to closely watch labs/chem with nephrology  Asked pt and family to make me aware if symptoms worsen        Change in mental status    Some night time auditory hallucinations Not bothersome to pt       Memory loss    Reassuring MMSE  today with score 28/30 - improved from last time         Other Visit Diagnoses     Need for influenza vaccination       Relevant Orders   Flu Vaccine QUAD High Dose([facility]) (Completed)

## 2022-07-26 NOTE — Assessment & Plan Note (Signed)
Has had surg for vasc access for dialysis  GFR last here 15   No urinary symptoms  Was unable to give sample today

## 2022-07-26 NOTE — Assessment & Plan Note (Signed)
Re check bp was better BP: (!) 146/62   Will f/u with nephrology for further management

## 2022-07-26 NOTE — Assessment & Plan Note (Signed)
Reassuring MMSE today with score 28/30 - improved from last time

## 2022-08-01 ENCOUNTER — Other Ambulatory Visit: Payer: Self-pay

## 2022-08-01 DIAGNOSIS — N184 Chronic kidney disease, stage 4 (severe): Secondary | ICD-10-CM

## 2022-08-03 ENCOUNTER — Ambulatory Visit: Payer: Medicare Other | Admitting: Vascular Surgery

## 2022-08-03 ENCOUNTER — Ambulatory Visit (INDEPENDENT_AMBULATORY_CARE_PROVIDER_SITE_OTHER): Payer: Medicare Other

## 2022-08-03 ENCOUNTER — Encounter: Payer: Self-pay | Admitting: Vascular Surgery

## 2022-08-03 VITALS — BP 178/75 | HR 71 | Temp 97.7°F | Ht 67.0 in | Wt 224.4 lb

## 2022-08-03 DIAGNOSIS — N184 Chronic kidney disease, stage 4 (severe): Secondary | ICD-10-CM

## 2022-08-03 NOTE — H&P (View-Only) (Signed)
[facility] [place]  Patient name: Jennifer [name] MRN: [id number] DOB: 06/10/1956 Sex: female  REASON FOR VISIT: Right brachiocephalic AV fistula creation on 06/28/2022  HPI: Jennifer [name] is a 67 y.o. female here today for follow-up.  She is here today with her daughter.  She reports that she has been told she has 15% renal function which has deteriorated.  She is not on hemodialysis currently.  He does not have any steal symptoms  Current Outpatient Medications  Medication Sig Dispense Refill   amLODipine (NORVASC) 10 MG tablet Take 1 tablet by mouth once daily 90 tablet 3   aspirin EC 81 MG tablet Take 1 tablet (81 mg total) by mouth 2 (two) times daily. Hold aspirin while you are on apixaban/Eliquis 1 tablet 0   atorvastatin (LIPITOR) 80 MG tablet TAKE 1 TABLET BY MOUTH ONCE DAILY 6 IN THE EVENING 30 tablet 6   BD PEN NEEDLE NANO 2ND GEN 32G X 4 MM MISC Inject into the skin 3 (three) times daily.     buPROPion (WELLBUTRIN SR) 150 MG 12 hr tablet Take 1 tablet (150 mg total) by mouth 2 (two) times daily. 180 tablet 3   carvedilol (COREG) 12.5 MG tablet Take 1 tablet (12.5 mg total) by mouth 2 (two) times daily with a meal. 60 tablet 6   DULoxetine (CYMBALTA) 60 MG capsule Take 1 capsule (60 mg total) by mouth daily. 90 capsule 3   ferrous sulfate 325 (65 FE) MG tablet Take 325 mg by mouth 2 (two) times daily with a meal.     furosemide (LASIX) 40 MG tablet Take 2 tablets (80 mg total) by mouth daily. Once a day (Patient taking differently: Take 40-80 mg by mouth See admin instructions. Take 80 mg in the morning and 40 mg at night) 30 tablet    HUMULIN 70/30 KWIKPEN (70-30) 100 UNIT/ML KwikPen Inject 15 units in the morning and if she eats 10 units at lunch and 15 units with supper meal 15 mL 11   insulin lispro protamine-lispro (HUMALOG 75/25 MIX) (75-25) 100 UNIT/ML SUSP injection Inject 10-25 Units into the skin See admin  instructions. Inject 25 units with breakfast, 10 units at lunch, 25 units at dinner     levothyroxine (SYNTHROID) 150 MCG tablet Take 150 mcg by mouth daily.     potassium chloride SA (KLOR-CON M) 20 MEQ tablet Take 20 mEq by mouth daily.     sodium bicarbonate 650 MG tablet Take 1 tablet (650 mg total) by mouth 2 (two) times daily. 20 tablet 0   VITAMIN D PO Take 1 capsule by mouth in the morning and at bedtime.     nitroGLYCERIN (NITROSTAT) 0.4 MG SL tablet Place 1 tablet (0.4 mg total) under the tongue every 5 (five) minutes x 3 doses as needed for chest pain (If no relief after 3rd dose, call 911 or go to ED.). 90 tablet 3   oxyCODONE-acetaminophen (PERCOCET) 5-325 MG tablet Take 1 tablet by mouth every 6 (six) hours as needed for severe pain. (Patient not taking: Reported on 08/03/2022) 8 tablet 0   No current facility-administered medications for this visit.     PHYSICAL EXAM: Vitals:   08/03/22 1013  BP: (!) 178/75  Pulse: 71  Temp: 97.7 F (36.5 C)  SpO2: 97%  Weight: 224 lb 6.4 oz (101.8 kg)  Height: '5\' 7"'$  (1.702 m)    GENERAL: The patient is a well-nourished female, in no acute distress. The  vital signs are documented above. Excellent Yarima Penman maturation of her right arm AV fistula.  She has a very large cephalic vein above her antecubital space.  She has an excellent thrill.  Did undergo duplex today showing very nice size maturation of her fistula with vein dilated to approximately 8 mm throughout its course  MEDICAL ISSUES:  Sujey Gundry size maturation of her AV fistula.  The vein does run quite deep to the fat.  I do not feel that she would have adequate dialysis based on the depth.  I discussed the need for superficial mobilization and ligation of competing branches as well.  We will schedule this as an outpatient at [facility] on 08/16/2022   Rosetta Posner, MD FACS [facility] [phone]  Note: Portions of this report  may have been transcribed using voice recognition software.  Every effort has been made to ensure accuracy; however, inadvertent computerized transcription errors may still be present.

## 2022-08-03 NOTE — Progress Notes (Signed)
[facility] [place]  Patient name: Jennifer [name] MRN: [id number] DOB: 10-10-1955 Sex: female  REASON FOR VISIT: Right brachiocephalic AV fistula creation on 06/28/2022  HPI: BRENDALY [name] is a 67 y.o. female here today for follow-up.  She is here today with her daughter.  She reports that she has been told she has 15% renal function which has deteriorated.  She is not on hemodialysis currently.  He does not have any steal symptoms  Current Outpatient Medications  Medication Sig Dispense Refill   amLODipine (NORVASC) 10 MG tablet Take 1 tablet by mouth once daily 90 tablet 3   aspirin EC 81 MG tablet Take 1 tablet (81 mg total) by mouth 2 (two) times daily. Hold aspirin while you are on apixaban/Eliquis 1 tablet 0   atorvastatin (LIPITOR) 80 MG tablet TAKE 1 TABLET BY MOUTH ONCE DAILY 6 IN THE EVENING 30 tablet 6   BD PEN NEEDLE NANO 2ND GEN 32G X 4 MM MISC Inject into the skin 3 (three) times daily.     buPROPion (WELLBUTRIN SR) 150 MG 12 hr tablet Take 1 tablet (150 mg total) by mouth 2 (two) times daily. 180 tablet 3   carvedilol (COREG) 12.5 MG tablet Take 1 tablet (12.5 mg total) by mouth 2 (two) times daily with a meal. 60 tablet 6   DULoxetine (CYMBALTA) 60 MG capsule Take 1 capsule (60 mg total) by mouth daily. 90 capsule 3   ferrous sulfate 325 (65 FE) MG tablet Take 325 mg by mouth 2 (two) times daily with a meal.     furosemide (LASIX) 40 MG tablet Take 2 tablets (80 mg total) by mouth daily. Once a day (Patient taking differently: Take 40-80 mg by mouth See admin instructions. Take 80 mg in the morning and 40 mg at night) 30 tablet    HUMULIN 70/30 KWIKPEN (70-30) 100 UNIT/ML KwikPen Inject 15 units in the morning and if she eats 10 units at lunch and 15 units with supper meal 15 mL 11   insulin lispro protamine-lispro (HUMALOG 75/25 MIX) (75-25) 100 UNIT/ML SUSP injection Inject 10-25 Units into the skin See admin  instructions. Inject 25 units with breakfast, 10 units at lunch, 25 units at dinner     levothyroxine (SYNTHROID) 150 MCG tablet Take 150 mcg by mouth daily.     potassium chloride SA (KLOR-CON M) 20 MEQ tablet Take 20 mEq by mouth daily.     sodium bicarbonate 650 MG tablet Take 1 tablet (650 mg total) by mouth 2 (two) times daily. 20 tablet 0   VITAMIN D PO Take 1 capsule by mouth in the morning and at bedtime.     nitroGLYCERIN (NITROSTAT) 0.4 MG SL tablet Place 1 tablet (0.4 mg total) under the tongue every 5 (five) minutes x 3 doses as needed for chest pain (If no relief after 3rd dose, call 911 or go to ED.). 90 tablet 3   oxyCODONE-acetaminophen (PERCOCET) 5-325 MG tablet Take 1 tablet by mouth every 6 (six) hours as needed for severe pain. (Patient not taking: Reported on 08/03/2022) 8 tablet 0   No current facility-administered medications for this visit.     PHYSICAL EXAM: Vitals:   08/03/22 1013  BP: (!) 178/75  Pulse: 71  Temp: 97.7 F (36.5 C)  SpO2: 97%  Weight: 224 lb 6.4 oz (101.8 kg)  Height: 5' 7"$  (1.702 m)    GENERAL: The patient is a well-nourished female, in no acute distress. The  vital signs are documented above. Excellent Alycea Segoviano maturation of her right arm AV fistula.  She has a very large cephalic vein above her antecubital space.  She has an excellent thrill.  Did undergo duplex today showing very nice size maturation of her fistula with vein dilated to approximately 8 mm throughout its course  MEDICAL ISSUES:  Ennis Heavner size maturation of her AV fistula.  The vein does run quite deep to the fat.  I do not feel that she would have adequate dialysis based on the depth.  I discussed the need for superficial mobilization and ligation of competing branches as well.  We will schedule this as an outpatient at [facility] on 08/16/2022   Rosetta Posner, MD FACS [facility] [phone]  Note: Portions of this report  may have been transcribed using voice recognition software.  Every effort has been made to ensure accuracy; however, inadvertent computerized transcription errors may still be present.

## 2022-08-04 ENCOUNTER — Other Ambulatory Visit: Payer: Self-pay

## 2022-08-04 DIAGNOSIS — N184 Chronic kidney disease, stage 4 (severe): Secondary | ICD-10-CM

## 2022-08-11 ENCOUNTER — Encounter: Payer: Self-pay | Admitting: Radiology

## 2022-08-12 ENCOUNTER — Encounter (HOSPITAL_COMMUNITY)
Admission: RE | Admit: 2022-08-12 | Discharge: 2022-08-12 | Disposition: A | Discharge status: Home / Self Care | Payer: Medicare Other | Source: Ambulatory Visit | Attending: Vascular Surgery | Admitting: Vascular Surgery

## 2022-08-12 NOTE — Pre-Procedure Instructions (Signed)
Attempted pre-op phone call. Her daughter Enna Siverly will call back.

## 2022-08-16 ENCOUNTER — Encounter (HOSPITAL_COMMUNITY): Payer: Self-pay | Admitting: Vascular Surgery

## 2022-08-16 ENCOUNTER — Encounter (HOSPITAL_COMMUNITY)
Admission: RE | Disposition: A | Discharge status: Home / Self Care | Payer: Self-pay | Source: Home / Self Care | Attending: Vascular Surgery

## 2022-08-16 ENCOUNTER — Other Ambulatory Visit: Payer: Self-pay

## 2022-08-16 ENCOUNTER — Ambulatory Visit (HOSPITAL_COMMUNITY): Payer: Medicare Other | Admitting: Certified Registered"

## 2022-08-16 ENCOUNTER — Ambulatory Visit (HOSPITAL_BASED_OUTPATIENT_CLINIC_OR_DEPARTMENT_OTHER): Payer: Medicare Other | Admitting: Certified Registered"

## 2022-08-16 ENCOUNTER — Ambulatory Visit (HOSPITAL_COMMUNITY)
Admission: RE | Admit: 2022-08-16 | Discharge: 2022-08-16 | Disposition: A | Discharge status: Home / Self Care | Payer: Medicare Other | Attending: Vascular Surgery | Admitting: Vascular Surgery

## 2022-08-16 ENCOUNTER — Encounter: Payer: Self-pay | Admitting: Family Medicine

## 2022-08-16 DIAGNOSIS — I252 Old myocardial infarction: Secondary | ICD-10-CM | POA: Insufficient documentation

## 2022-08-16 DIAGNOSIS — N184 Chronic kidney disease, stage 4 (severe): Secondary | ICD-10-CM

## 2022-08-16 DIAGNOSIS — Z794 Long term (current) use of insulin: Secondary | ICD-10-CM

## 2022-08-16 DIAGNOSIS — I130 Hypertensive heart and chronic kidney disease with heart failure and stage 1 through stage 4 chronic kidney disease, or unspecified chronic kidney disease: Secondary | ICD-10-CM | POA: Insufficient documentation

## 2022-08-16 DIAGNOSIS — N189 Chronic kidney disease, unspecified: Secondary | ICD-10-CM | POA: Diagnosis not present

## 2022-08-16 DIAGNOSIS — I509 Heart failure, unspecified: Secondary | ICD-10-CM | POA: Diagnosis not present

## 2022-08-16 DIAGNOSIS — I2510 Atherosclerotic heart disease of native coronary artery without angina pectoris: Secondary | ICD-10-CM | POA: Insufficient documentation

## 2022-08-16 DIAGNOSIS — Z8673 Personal history of transient ischemic attack (TIA), and cerebral infarction without residual deficits: Secondary | ICD-10-CM

## 2022-08-16 DIAGNOSIS — R440 Auditory hallucinations: Secondary | ICD-10-CM

## 2022-08-16 DIAGNOSIS — T82898A Other specified complication of vascular prosthetic devices, implants and grafts, initial encounter: Secondary | ICD-10-CM

## 2022-08-16 DIAGNOSIS — R410 Disorientation, unspecified: Secondary | ICD-10-CM

## 2022-08-16 DIAGNOSIS — N185 Chronic kidney disease, stage 5: Secondary | ICD-10-CM | POA: Diagnosis not present

## 2022-08-16 DIAGNOSIS — E1122 Type 2 diabetes mellitus with diabetic chronic kidney disease: Secondary | ICD-10-CM | POA: Diagnosis not present

## 2022-08-16 DIAGNOSIS — Z87891 Personal history of nicotine dependence: Secondary | ICD-10-CM

## 2022-08-16 DIAGNOSIS — J449 Chronic obstructive pulmonary disease, unspecified: Secondary | ICD-10-CM

## 2022-08-16 DIAGNOSIS — R413 Other amnesia: Secondary | ICD-10-CM

## 2022-08-16 HISTORY — PX: FISTULA SUPERFICIALIZATION: SHX6341

## 2022-08-16 LAB — GLUCOSE, CAPILLARY: Glucose-Capillary: 155 mg/dL — ABNORMAL HIGH (ref 70–99)

## 2022-08-16 SURGERY — FISTULA SUPERFICIALIZATION
Anesthesia: General | Site: Arm Upper | Laterality: Right

## 2022-08-16 MED ORDER — SODIUM CHLORIDE 0.9 % IV SOLN: INTRAVENOUS | Status: DC

## 2022-08-16 MED ORDER — CEFAZOLIN SODIUM-DEXTROSE 2-4 GM/100ML-% IV SOLN
2.0000 g | INTRAVENOUS | Status: AC
  Administered 2022-08-16: 2 g via INTRAVENOUS

## 2022-08-16 MED ORDER — LACTATED RINGERS IV SOLN: INTRAVENOUS | Status: DC

## 2022-08-16 MED ORDER — CEFAZOLIN SODIUM-DEXTROSE 2-4 GM/100ML-% IV SOLN
INTRAVENOUS | Status: AC
  Filled 2022-08-16: qty 100

## 2022-08-16 MED ORDER — CHLORHEXIDINE GLUCONATE 4 % EX LIQD: 60.0000 mL | Freq: Once | CUTANEOUS | Status: DC

## 2022-08-16 MED ORDER — OXYCODONE-ACETAMINOPHEN 5-325 MG PO TABS: 1.0000 | ORAL_TABLET | Freq: Four times a day (QID) | ORAL | 0 refills | Status: DC | PRN

## 2022-08-16 MED ORDER — TRIPLE ANTIBIOTIC 3.5-400-5000 EX OINT
TOPICAL_OINTMENT | CUTANEOUS | Status: AC
  Filled 2022-08-16: qty 1

## 2022-08-16 MED ORDER — PROPOFOL 10 MG/ML IV BOLUS
INTRAVENOUS | Status: DC | PRN
  Administered 2022-08-16: 30 mg via INTRAVENOUS

## 2022-08-16 MED ORDER — DEXMEDETOMIDINE HCL IN NACL 80 MCG/20ML IV SOLN
INTRAVENOUS | Status: DC | PRN
  Administered 2022-08-16 (×2): 5 ug via BUCCAL

## 2022-08-16 MED ORDER — HEPARIN SODIUM (PORCINE) 1000 UNIT/ML IJ SOLN
INTRAMUSCULAR | Status: AC
  Filled 2022-08-16: qty 6

## 2022-08-16 MED ORDER — ONDANSETRON HCL 4 MG/2ML IJ SOLN
INTRAMUSCULAR | Status: DC | PRN
  Administered 2022-08-16: 4 mg via INTRAVENOUS

## 2022-08-16 MED ORDER — HEPARIN 6000 UNIT IRRIGATION SOLUTION
Status: DC | PRN
  Administered 2022-08-16: 1

## 2022-08-16 MED ORDER — LIDOCAINE-EPINEPHRINE 0.5 %-1:200000 IJ SOLN
INTRAMUSCULAR | Status: DC | PRN
  Administered 2022-08-16: 20 mL

## 2022-08-16 MED ORDER — PROPOFOL 500 MG/50ML IV EMUL
INTRAVENOUS | Status: DC | PRN
  Administered 2022-08-16: 50 ug/kg/min via INTRAVENOUS

## 2022-08-16 MED ORDER — LIDOCAINE-EPINEPHRINE 0.5 %-1:200000 IJ SOLN
INTRAMUSCULAR | Status: AC
  Filled 2022-08-16: qty 50

## 2022-08-16 MED ORDER — PROPOFOL 500 MG/50ML IV EMUL
INTRAVENOUS | Status: AC
  Filled 2022-08-16: qty 100

## 2022-08-16 MED ORDER — 0.9 % SODIUM CHLORIDE (POUR BTL) OPTIME
TOPICAL | Status: DC | PRN
  Administered 2022-08-16: 1000 mL

## 2022-08-16 SURGICAL SUPPLY — 37 items
ADH SKN CLS APL DERMABOND .7 (GAUZE/BANDAGES/DRESSINGS) ×1
ARMBAND PINK RESTRICT EXTREMIT (MISCELLANEOUS) ×1 IMPLANT
BAG HAMPER (MISCELLANEOUS) ×2 IMPLANT
BNDG ELASTIC 4X5.8 VLCR STR LF (GAUZE/BANDAGES/DRESSINGS) ×1 IMPLANT
CANNULA VESSEL 3MM 2 BLNT TIP (CANNULA) ×1 IMPLANT
CLIP LIGATING EXTRA MED SLVR (CLIP) ×1 IMPLANT
CLIP LIGATING EXTRA SM BLUE (MISCELLANEOUS) ×1 IMPLANT
COVER LIGHT HANDLE STERIS (MISCELLANEOUS) ×1 IMPLANT
COVER MAYO STAND XLG (MISCELLANEOUS) ×1 IMPLANT
COVER PROBE W GEL 5X96 (DRAPES) ×1 IMPLANT
DECANTER SPIKE VIAL GLASS SM (MISCELLANEOUS) ×1 IMPLANT
DERMABOND ADVANCED .7 DNX12 (GAUZE/BANDAGES/DRESSINGS) ×1 IMPLANT
ELECT REM PT RETURN 9FT ADLT (ELECTROSURGICAL) ×1
ELECTRODE REM PT RTRN 9FT ADLT (ELECTROSURGICAL) ×1 IMPLANT
GAUZE SPONGE 4X4 12PLY STRL (GAUZE/BANDAGES/DRESSINGS) ×1 IMPLANT
GLOVE BIOGEL PI IND STRL 7.0 (GLOVE) ×2 IMPLANT
GLOVE SURG MICRO LTX SZ7.5 (GLOVE) ×1 IMPLANT
GOWN STRL REUS W/TWL LRG LVL3 (GOWN DISPOSABLE) ×3 IMPLANT
KIT BLADEGUARD II DBL (SET/KITS/TRAYS/PACK) ×1 IMPLANT
KIT TURNOVER KIT A (KITS) ×1 IMPLANT
MANIFOLD NEPTUNE II (INSTRUMENTS) ×2 IMPLANT
MARKER SKIN DUAL TIP RULER LAB (MISCELLANEOUS) ×2 IMPLANT
NDL HYPO 18GX1.5 BLUNT FILL (NEEDLE) ×1 IMPLANT
NEEDLE HYPO 18GX1.5 BLUNT FILL (NEEDLE) ×1 IMPLANT
NS IRRIG 1000ML POUR BTL (IV SOLUTION) ×1 IMPLANT
PACK CV ACCESS (CUSTOM PROCEDURE TRAY) ×1 IMPLANT
PAD ARMBOARD 7.5X6 YLW CONV (MISCELLANEOUS) ×1 IMPLANT
SET BASIN LINEN APH (SET/KITS/TRAYS/PACK) ×1 IMPLANT
SUT PROLENE 6 0 CC (SUTURE) IMPLANT
SUT SILK 2 0 SH (SUTURE) IMPLANT
SUT SILK 3 0 (SUTURE) ×1
SUT SILK 3-0 18XBRD TIE 12 (SUTURE) IMPLANT
SUT VIC AB 3-0 SH 27 (SUTURE) ×3
SUT VIC AB 3-0 SH 27X BRD (SUTURE) ×1 IMPLANT
SYR 20ML LL LF (SYRINGE) ×1 IMPLANT
SYR CONTROL 10ML LL (SYRINGE) ×1 IMPLANT
WATER STERILE IRR 1000ML POUR (IV SOLUTION) ×1 IMPLANT

## 2022-08-16 NOTE — Discharge Instructions (Signed)
[facility]  Discharge Instructions  AV Fistula or Graft Surgery for Dialysis Access  Please refer to the following instructions for your post-procedure care. Your surgeon or physician assistant will discuss any changes with you.  Activity  You may drive the day following your surgery, if you are comfortable and no longer taking prescription pain medication. Resume full activity as the soreness in your incision resolves.  Bathing/Showering  You may shower after you go home. Keep your incision dry for 48 hours. Do not soak in a bathtub, hot tub, or swim until the incision heals completely. You may not shower if you have a hemodialysis catheter.  Incision Care  Clean your incision with mild soap and water after 48 hours. Pat the area dry with a clean towel. You do not need a bandage unless otherwise instructed. Do not apply any ointments or creams to your incision. You may have skin glue on your incision. Do not peel it off. It will come off on its own in about one week. Your arm may swell a bit after surgery. To reduce swelling use pillows to elevate your arm so it is above your heart. Your doctor will tell you if you need to lightly wrap your arm with an ACE bandage.  Diet  Resume your normal diet. There are not special food restrictions following this procedure. In order to heal from your surgery, it is CRITICAL to get adequate nutrition. Your body requires vitamins, minerals, and protein. Vegetables are the best source of vitamins and minerals. Vegetables also provide the perfect balance of protein. Processed food has little nutritional value, so try to avoid this.  Medications  Resume taking all of your medications. If your incision is causing pain, you may take over-the counter pain relievers such as acetaminophen (Tylenol). If you were prescribed a stronger pain medication, please be aware these medications can cause nausea and constipation. Prevent  nausea by taking the medication with a snack or meal. Avoid constipation by drinking plenty of fluids and eating foods with high amount of fiber, such as fruits, vegetables, and grains.  Do not take Tylenol if you are taking prescription pain medications.  Follow up Your surgeon may want to see you in the office following your access surgery. If so, this will be arranged at the time of your surgery.  Please call us immediately for any of the following conditions:  Increased pain, redness, drainage (pus) from your incision site Fever of 101 degrees or higher Severe or worsening pain at your incision site Hand pain or numbness.  Reduce your risk of vascular disease:  Stop smoking. If you would like help, call [facility] at 1-800-QUIT-NOW 854-851-5833) or [place] at [place] your cholesterol Maintain a desired weight Control your diabetes Keep your blood pressure down  Dialysis  It will take several weeks to several months for your new dialysis access to be ready for use. Your surgeon will determine when it is okay to use it. Your nephrologist will continue to direct your dialysis. You can continue to use your Permcath until your new access is ready for use.   08/16/2022 Jennifer [name] [id number] 06-09-1956  Surgeon(s): Reva Pinkley, Arvilla Meres, MD  Procedure(s): RIGHT ARTERIOVENOUS FISTULA SUPERFICIALIZATION   May stick graft immediately   May stick graft on designated area only:    Do not stick fistula for 4  weeks    If you have any questions, please call the office at [phone].

## 2022-08-16 NOTE — Assessment & Plan Note (Signed)
This has worsened She is not getting up at night Getting confused at times/wandering Some worry about dementia

## 2022-08-16 NOTE — Assessment & Plan Note (Addendum)
Worsening   Some evidence of sundowning

## 2022-08-16 NOTE — Interval H&P Note (Signed)
History and Physical Interval Note:  08/16/2022 9:18 AM  Drinda Butts  has presented today for surgery, with the diagnosis of CKD IV.  The various methods of treatment have been discussed with the patient and family. After consideration of risks, benefits and other options for treatment, the patient has consented to  Procedure(s): RIGHT ARTERIOVENOUS FISTULA SUPERFICIALIZATION (Right) as a surgical intervention.  The patient's history has been reviewed, patient examined, no change in status, stable for surgery.  I have reviewed the patient's chart and labs.  Questions were answered to the patient's satisfaction.     Curt Jews

## 2022-08-16 NOTE — Op Note (Signed)
    OPERATIVE REPORT  DATE OF SURGERY: 08/16/2022  PATIENT: Jennifer [name], 67 y.o. female MRN: [id number]  DOB: 01-May-1956  PRE-OPERATIVE DIAGNOSIS: Chronic renal insufficiency  POST-OPERATIVE DIAGNOSIS:  Same  PROCEDURE: Superficial mobilization of right upper arm AV fistula and division of competing branches  SURGEON:  Curt Jews, M.D.  PHYSICIAN ASSISTANT: Fulton Mole, RNFA  The assistant was needed for exposure and to expedite the case  ANESTHESIA: MAC  EBL: per anesthesia record  Total I/O In: 300 [I.V.:300] Out: 5 [Blood:5]  BLOOD ADMINISTERED: none  DRAINS: none  SPECIMEN: none  COUNTS CORRECT:  YES  PATIENT DISPOSITION:  PACU - hemodynamically stable  PROCEDURE DETAILS: The patient was taken operating placed supine position where the area of the right arm and right axilla were prepped and draped in usual sterile fashion.  SonoSite ultrasound was used to mark the location of the upper arm brachiocephalic fistula on the surface of the skin.  Using local anesthesia, 2 separate incisions were made over the upper arm away from the antecubital anastomosis.  The vein was identified deep to the fat.  The fascia overlying the vein was divided.  The vein was mobilized circumferentially from just above the anastomosis to just below the shoulder.  Of excellent size.  There were several large competing branches and these were ligated.  The wounds were irrigated with saline and hemostasis was obtained electrocautery.  The fat below the vein was closed with running 3-0 Vicryl sutures.  The skin was closed to directly over the vein with running 3-0 Vicryl subcuticular suture.  Dermabond was applied.  The patient was transferred to the recovery room in stable condition   Rosetta Posner, M.D., [facility] 08/16/2022 11:35 AM  Note: Portions of this report may have been transcribed using voice recognition software.  Every effort has been made to ensure accuracy; however, inadvertent  computerized transcription errors may still be present.

## 2022-08-16 NOTE — Anesthesia Preprocedure Evaluation (Signed)
Anesthesia Evaluation  Patient identified by MRN, date of birth, ID band Patient awake    Reviewed: Allergy & Precautions, H&P , NPO status , Patient's Chart, lab work & pertinent test results, reviewed documented beta blocker date and time   Airway Mallampati: II  TM Distance: >3 FB Neck ROM: full    Dental no notable dental hx.    Pulmonary neg pulmonary ROS, COPD, former smoker   Pulmonary exam normal breath sounds clear to auscultation       Cardiovascular Exercise Tolerance: Good hypertension, + CAD, + Past MI and +CHF  negative cardio ROS  Rhythm:regular Rate:Normal     Neuro/Psych  Headaches PSYCHIATRIC DISORDERS       Neuromuscular disease CVA negative neurological ROS  negative psych ROS   GI/Hepatic negative GI ROS, Neg liver ROS,,,  Endo/Other  negative endocrine ROSdiabetesHypothyroidism    Renal/GU CRFRenal diseasenegative Renal ROS  negative genitourinary   Musculoskeletal   Abdominal   Peds  Hematology negative hematology ROS (+) Blood dyscrasia, anemia   Anesthesia Other Findings   Reproductive/Obstetrics negative OB ROS                             Anesthesia Physical Anesthesia Plan  ASA: 3  Anesthesia Plan: General   Post-op Pain Management:    Induction:   PONV Risk Score and Plan: Propofol infusion  Airway Management Planned:   Additional Equipment:   Intra-op Plan:   Post-operative Plan:   Informed Consent: I have reviewed the patients History and Physical, chart, labs and discussed the procedure including the risks, benefits and alternatives for the proposed anesthesia with the patient or authorized representative who has indicated his/her understanding and acceptance.     Dental Advisory Given  Plan Discussed with: CRNA  Anesthesia Plan Comments:        Anesthesia Quick Evaluation

## 2022-08-16 NOTE — Transfer of Care (Signed)
Immediate Anesthesia Transfer of Care Note  Patient: Jennifer [name]  Procedure(s) Performed: RIGHT ARTERIOVENOUS FISTULA SUPERFICIALIZATION (Right: Arm Upper)  Patient Location: PACU  Anesthesia Type:General  Level of Consciousness: awake, alert , oriented, drowsy and patient cooperative  Airway & Oxygen Therapy: Patient Spontanous Breathing and Patient connected to face mask oxygen  Post-op Assessment: Report given to RN, Post -op Vital signs reviewed and stable and Patient moving all extremities X 4  Post vital signs: Reviewed and stable  Last Vitals:  Vitals Value Taken Time  BP    Temp    Pulse 71 08/16/22 1119  Resp    SpO2 100 % 08/16/22 1119  Vitals shown include unvalidated device data.  Last Pain:  Vitals:   08/16/22 0832  PainSc: 0-No pain      Patients Stated Pain Goal: 4 ([id number])  Complications: No notable events documented.

## 2022-08-17 LAB — POCT I-STAT, CHEM 8
BUN: 59 mg/dL — ABNORMAL HIGH (ref 8–23)
Calcium, Ion: 1.08 mmol/L — ABNORMAL LOW (ref 1.15–1.40)
Chloride: 113 mmol/L — ABNORMAL HIGH (ref 98–111)
Creatinine, Ser: 4.1 mg/dL — ABNORMAL HIGH (ref 0.44–1.00)
Glucose, Bld: 183 mg/dL — ABNORMAL HIGH (ref 70–99)
HCT: 28 % — ABNORMAL LOW (ref 36.0–46.0)
Hemoglobin: 9.5 g/dL — ABNORMAL LOW (ref 12.0–15.0)
Potassium: 5 mmol/L (ref 3.5–5.1)
Sodium: 138 mmol/L (ref 135–145)
TCO2: 17 mmol/L — ABNORMAL LOW (ref 22–32)

## 2022-08-17 NOTE — Anesthesia Postprocedure Evaluation (Signed)
Anesthesia Post Note  Patient: Jennifer [name]  Procedure(s) Performed: RIGHT ARTERIOVENOUS FISTULA SUPERFICIALIZATION (Right: Arm Upper)  Patient location during evaluation: Phase II Anesthesia Type: General Level of consciousness: awake Pain management: pain level controlled Vital Signs Assessment: post-procedure vital signs reviewed and stable Respiratory status: spontaneous breathing and respiratory function stable Cardiovascular status: blood pressure returned to baseline and stable Postop Assessment: no headache and no apparent nausea or vomiting Anesthetic complications: no Comments: Late entry   No notable events documented.   Last Vitals:  Vitals:   08/16/22 1157 08/16/22 1202  BP:  (!) 147/68  Pulse: 68   Resp: 16   Temp: 36.5 C   SpO2: 97%     Last Pain:  Vitals:   08/16/22 1157  TempSrc: Oral  PainSc: 0-No pain                 Louann Sjogren

## 2022-08-18 ENCOUNTER — Telehealth: Payer: Self-pay | Admitting: Vascular Surgery

## 2022-08-18 NOTE — Telephone Encounter (Signed)
-----   Message from Rosetta Posner, MD sent at 08/16/2022 11:38 AM EST -----  Superficial mobilization and ligation of competing branches right upper arm AV fistula.  I need to see her in the office in 1 month.  She does not need duplex at that time

## 2022-08-18 NOTE — Telephone Encounter (Signed)
Appt has been scheduled.

## 2022-08-24 ENCOUNTER — Ambulatory Visit: Payer: Self-pay

## 2022-08-24 NOTE — Patient Outreach (Signed)
  Care Coordination   Follow Up Visit Note   08/24/2022 Name: Jennifer [name] MRN: [id number] DOB: 1955-11-08  Jennifer [name] is a 67 y.o. year old female who sees [name], [name], MD for primary care. I  spoke with daughter/ designated party release, Jennifer [name]  What matters to the patients health and wellness today?  Daughter confirmed patient recently had hemodialysis fistula placed. She reports area is healing well with no signs of infection. Daughter states patient's memory has gotten worse over the last 4-5 months. She states patient has had mild memory issues over the last couple of years. Daughter states primary care provider sent referral to neurology office. She states she is waiting to get a scheduled appointment for patient. Daughter states patients husband and daughter live with her. She states patient has someone with her at all times.  Daughter denies patient wandering. She states patients " talks out of her head" on occasion or talks to deceased relatives.   Daughter states has outside steps and a newly installed pool.  Daughter states patient has life alert but unable to find the alert button.     Goals Addressed             This Visit's Progress    Patient / caregiver statement:  Management of health conditions       Interventions Today    Flowsheet Row Most Recent Value  Chronic Disease   Chronic disease during today's visit Chronic Kidney Disease/End Stage Renal Disease (ESRD), Other  [Memory loss. Evaluation of current treatment plan related to CKD IV and memory loss and patient's adherence to plan as established by provider.]  General Interventions   General Interventions Discussed/Reviewed Doctor Visits, General Interventions Reviewed  [Daughter advised to call neurology office to set up appointment. Confirmed patient has follow up appointment with nephrologist and transport. Offered daughter renal safe start program. Discussed advanced directive and need to have on file  with PCP office]  Doctor Visits Discussed/Reviewed Doctor Visits Reviewed  Jennifer [name] scheduled/ upcoming provider appointments.]  Pharmacy Interventions   Pharmacy Dicussed/Reviewed Pharmacy Topics Reviewed  [medications reviewed and compliance discussed. Message sent to provider office at request of daughter regarding need for renewed Synthroid prescription]  Safety Interventions   Safety Discussed/Reviewed Home Safety  [Discussed home safety for patient due to outside stairs and recently installed pool,  Advised to call medical alert company and request new monitor.]               SDOH assessments and interventions completed:  No     Care Coordination Interventions:  Yes, provided   Follow up plan: Follow up call scheduled for 10/06/22    Encounter Outcome:  Pt. Visit Completed   Quinn Plowman RN,BSN,CCM [place] [phone] direct line

## 2022-08-26 ENCOUNTER — Encounter (HOSPITAL_COMMUNITY): Payer: Self-pay | Admitting: Vascular Surgery

## 2022-08-30 ENCOUNTER — Institutional Professional Consult (permissible substitution): Payer: Medicare Other | Admitting: Neurology

## 2022-09-06 ENCOUNTER — Telehealth: Payer: Self-pay

## 2022-09-06 NOTE — Telephone Encounter (Signed)
Renal coordinator received internal referral for patient regarding Safe Start program. Coordinator shared with [facility] RN Case Manager patient has been active with program since December. 3 month follow up with daughter Barnett Applebaum) on 08/12/22 who states patient had access placed 06/28/22. Per daughter, patient has no needs at this time.   Renal will follow up in 3 months per daughters request.   Ina Homes Renal Coordinator [place] [phone]

## 2022-09-14 ENCOUNTER — Ambulatory Visit (INDEPENDENT_AMBULATORY_CARE_PROVIDER_SITE_OTHER): Payer: Medicare Other | Admitting: Vascular Surgery

## 2022-09-14 ENCOUNTER — Encounter: Payer: Self-pay | Admitting: Vascular Surgery

## 2022-09-14 VITALS — BP 152/78 | HR 75 | Temp 97.3°F | Ht 67.0 in | Wt 240.0 lb

## 2022-09-14 DIAGNOSIS — N184 Chronic kidney disease, stage 4 (severe): Secondary | ICD-10-CM

## 2022-09-14 NOTE — Progress Notes (Signed)
[facility] [place]  Patient name: Jennifer [name] MRN: [id number] DOB: [birth year] Sex: female  REASON FOR VISIT: Follow-up right arm access  HPI: Jennifer [name] is a 67 y.o. female here today for follow-up.  She initially had a right brachiocephalic AV fistula creation on 06/28/2022.  She had good Arnika Larzelere vein size maturation and her vein did run deep to the fat.  She was taken back to the operating room for revision on 08/16/2022 for superficial mobilization.  She is here today for follow-up.  She has had no difficulty with her.  She has no indication for dialysis currently.  Current Outpatient Medications  Medication Sig Dispense Refill   amLODipine (NORVASC) 10 MG tablet Take 1 tablet by mouth once daily 90 tablet 3   aspirin EC 81 MG tablet Take 1 tablet (81 mg total) by mouth 2 (two) times daily. Hold aspirin while you are on apixaban/Eliquis 1 tablet 0   atorvastatin (LIPITOR) 80 MG tablet TAKE 1 TABLET BY MOUTH ONCE DAILY 6 IN THE EVENING 30 tablet 6   BD PEN NEEDLE NANO 2ND GEN 32G X 4 MM MISC Inject into the skin 3 (three) times daily.     buPROPion (WELLBUTRIN SR) 150 MG 12 hr tablet Take 1 tablet (150 mg total) by mouth 2 (two) times daily. 180 tablet 3   carvedilol (COREG) 12.5 MG tablet Take 1 tablet (12.5 mg total) by mouth 2 (two) times daily with a meal. 60 tablet 6   cholecalciferol (VITAMIN D3) 25 MCG (1000 UNIT) tablet Take 1,000 Units by mouth in the morning and at bedtime.     DULoxetine (CYMBALTA) 60 MG capsule Take 1 capsule (60 mg total) by mouth daily. 90 capsule 3   ferrous sulfate 325 (65 FE) MG tablet Take 325 mg by mouth 2 (two) times daily with a meal.     furosemide (LASIX) 40 MG tablet Take 2 tablets (80 mg total) by mouth daily. Once a day (Patient taking differently: Take 40-80 mg by mouth See admin instructions. Take 80 mg in the morning and 40 mg at night) 30 tablet    insulin lispro protamine-lispro  (HUMALOG 75/25 MIX) (75-25) 100 UNIT/ML SUSP injection Inject 10-25 Units into the skin See admin instructions. Inject 25 units with breakfast, 10 units at lunch, 25 units at dinner     levothyroxine (SYNTHROID) 150 MCG tablet Take 150 mcg by mouth daily before breakfast.     nitroGLYCERIN (NITROSTAT) 0.4 MG SL tablet Place 0.4 mg under the tongue every 5 (five) minutes as needed for chest pain.     potassium chloride SA (KLOR-CON M) 20 MEQ tablet Take 20 mEq by mouth daily.     sodium bicarbonate 650 MG tablet Take 1 tablet (650 mg total) by mouth 2 (two) times daily. 20 tablet 0   No current facility-administered medications for this visit.     PHYSICAL EXAM: Vitals:   09/14/22 0904  BP: (!) 152/78  Pulse: 75  Temp: (!) 97.3 F (36.3 C)  SpO2: 96%  Weight: 240 lb (108.9 kg)  Height: 5\' 7"  (1.702 m)    GENERAL: The patient is a well-nourished female, in no acute distress. The vital signs are documented above. Healing of her antecubital and 2 upper arm incisions.  Her vein has a excellent thrill throughout its course and does have good size maturation and is much more superficial  MEDICAL ISSUES: Status post right brachiocephalic AV fistula creation and subsequent superficial mobilization.  I feel that her fistula access should be available at any time should she progress to end-stage renal disease needing hemodialysis.  She will see [place] again on an as-needed basis   Rosetta Posner, MD FACS [facility] Tel [phone]  Note: Portions of this report may have been transcribed using voice recognition software.  Every effort has been made to ensure accuracy; however, inadvertent computerized transcription errors may still be present.

## 2022-10-06 ENCOUNTER — Ambulatory Visit: Payer: Self-pay

## 2022-10-06 NOTE — Patient Outreach (Signed)
  Care Coordination   10/06/2022 Name: Jennifer [name] MRN: [id number] DOB: 09/16/1955   Care Coordination Outreach Attempts:  An unsuccessful telephone outreach was attempted for a scheduled appointment today. HIPAA compliant message left with call back phone number and return call request.   Follow Up Plan:  Additional outreach attempts will be made to offer the patient care coordination information and services.   Encounter Outcome:  No Answer   Care Coordination Interventions:  No, not indicated    George Ina [facility] Care Coordination [phone] direct line

## 2022-10-13 ENCOUNTER — Telehealth: Payer: Self-pay | Admitting: *Deleted

## 2022-10-13 NOTE — Progress Notes (Signed)
  Care Coordination Note  10/13/2022 Name: Jennifer [name] MRN: [id number] DOB: 03/24/1956  Jennifer [name] is a 67 y.o. year old female who is a primary care patient of [name], Audrie Gallus, MD and is actively engaged with the care management team. I reached out to Janine Limbo by phone today to assist with re-scheduling a follow up visit with the RN Case Manager  Follow up plan: Telephone appointment with care management team member scheduled for: 10/21/2022  Burman Nieves, [facility] Care Coordination Care Guide Direct Dial: [phone]

## 2022-10-13 NOTE — Progress Notes (Signed)
  Care Coordination Note  10/13/2022 Name: JOANY KHATIB MRN: [id number] DOB: March 02, 1956  TERSEA AULDS is a 67 y.o. year old female who is a primary care patient of [name], Audrie Gallus, MD and is actively engaged with the care management team. I reached out to Janine Limbo by phone today to assist with re-scheduling a follow up visit with the RN Case Manager  Follow up plan: Unsuccessful telephone outreach attempt made. A HIPAA compliant phone message was left for the patient providing contact information and requesting a return call.   Burman Nieves, CCMA Care Coordination Care Guide Direct Dial: [phone]

## 2022-10-18 ENCOUNTER — Other Ambulatory Visit: Payer: Self-pay | Admitting: Family Medicine

## 2022-10-19 NOTE — Telephone Encounter (Signed)
Last office visit 07/26/22 Cymbalta last refill 4/17/2 #90/3 Wellbutrin last refill 09/27/21 #180/3

## 2022-10-20 ENCOUNTER — Other Ambulatory Visit: Payer: Self-pay | Admitting: *Deleted

## 2022-10-20 MED ORDER — ATORVASTATIN CALCIUM 80 MG PO TABS: ORAL_TABLET | ORAL | 6 refills | Status: DC

## 2022-10-20 MED ORDER — CARVEDILOL 12.5 MG PO TABS: 12.5000 mg | ORAL_TABLET | Freq: Two times a day (BID) | ORAL | 6 refills | Status: DC

## 2022-10-21 ENCOUNTER — Ambulatory Visit: Payer: Self-pay

## 2022-10-21 NOTE — Patient Outreach (Signed)
  Care Coordination   10/21/2022 Name: Jennifer [name] MRN: [id number] DOB: [birth year]   Care Coordination Outreach Attempts:  An unsuccessful telephone outreach was attempted for a scheduled appointment today.HPAA compliant voice message left with call back phone number.   Follow Up Plan:  Additional outreach attempts will be made to offer the patient care coordination information and services.   Encounter Outcome:  No Answer   Care Coordination Interventions:  No, not indicated    George Ina [facility] Care Coordination [phone] direct line

## 2022-10-26 ENCOUNTER — Ambulatory Visit: Payer: Self-pay

## 2022-10-26 NOTE — Patient Outreach (Signed)
  Care Coordination   Follow Up Visit Note   10/26/2022 Name: Jennifer [name] MRN: [id number] DOB: 08-May-1956  EVYANNA [name] is a 67 y.o. year old female who sees [name], Audrie Gallus, MD for primary care. I spoke with  patients daughter/ dpr Erin Sons by phone today.  What matters to the patients health and wellness today?  Daughter states patient is scheduled to see the neurologist on 11/22/22.  She states patient having more periods of irritation or being angry.  Daughter states she has noticed these symptoms recently. Daughter states she, her sister and father continue to provide patient with 24 hour supervision/ care.  Daughter states patient is scheduled for a cardiology follow up visit on 11/24/21.    Goals Addressed             This Visit's Progress    Patient / caregiver statement:  Management of health conditions       Interventions Today    Flowsheet Row Most Recent Value  Chronic Disease   Chronic disease during today's visit Other  [memory loss]  General Interventions   General Interventions Discussed/Reviewed General Interventions Reviewed, Doctor Visits  [evaluation of current treatment plan for memory loss and patients adherence to plan as establised by provider.  Assessed for ongoing memory changes, new onset symptoms.]  Doctor Visits Discussed/Reviewed Doctor Visits Reviewed  [facility] neurology visit scheduled for patient. Reviewed other scheduled / upcoming provider visits.]  Pharmacy Interventions   Pharmacy Dicussed/Reviewed Pharmacy Topics Reviewed  [medications reviewed and compliance discussed.]               SDOH assessments and interventions completed:  No     Care Coordination Interventions:  Yes, provided   Follow up plan: Follow up call scheduled for 11/30/22    Encounter Outcome:  Pt. Visit Completed   George Ina RN,BSN,CCM [facility] Care Coordination [phone] direct line

## 2022-11-22 ENCOUNTER — Encounter: Payer: Self-pay | Admitting: Neurology

## 2022-11-22 ENCOUNTER — Ambulatory Visit (INDEPENDENT_AMBULATORY_CARE_PROVIDER_SITE_OTHER): Payer: Medicare Other | Admitting: Neurology

## 2022-11-22 VITALS — BP 178/73 | HR 72 | Ht 66.0 in | Wt 226.6 lb

## 2022-11-22 DIAGNOSIS — R413 Other amnesia: Secondary | ICD-10-CM | POA: Diagnosis not present

## 2022-11-22 DIAGNOSIS — R440 Auditory hallucinations: Secondary | ICD-10-CM | POA: Diagnosis not present

## 2022-11-22 DIAGNOSIS — F0150 Vascular dementia without behavioral disturbance: Secondary | ICD-10-CM

## 2022-11-22 MED ORDER — MEMANTINE HCL 10 MG PO TABS: 10.0000 mg | ORAL_TABLET | Freq: Two times a day (BID) | ORAL | 3 refills | Status: DC

## 2022-11-22 MED ORDER — MEMANTINE HCL 28 X 5 MG & 21 X 10 MG PO TABS: ORAL_TABLET | ORAL | 12 refills | Status: DC

## 2022-11-22 NOTE — Progress Notes (Signed)
[facility] [address] [place]. [place] [address] [phone]       OFFICE CONSULT NOTE  [name] Date of Birth:  11-Dec-1955 Medical Record Number:  [id number]   Referring MD:  Jennifer [name]  Reason for Referral: Hallucinations and memory loss  HPI: Jennifer [name] is a 67 year old Caucasian lady seen today for office consultation visit hallucinations and memory loss.  She is accompanied by her daughter.  History is obtained from them and review of electronic medical records.  I personally reviewed pertinent available imaging films in PACS.  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  Patient was last seen in the office for stroke follow-up by Shanda Bumps nurse practitioner on 02/12/2019 then lost to follow-up.  Patient has had some cognitive deterioration for the last 6 to 7 months.  Family has noticed that she often talks to the parents.  During screening things like getting up at 3 in the morning and trying to cook breakfast and she has not cooked for years.  Confused and gets disoriented.  She often forgets the name of this.  With her husband and daughter.  She has not exhibited any behavior agitation.  Needs help with activities of daily living like taking a bath and changing her clothes.  She is never left alone more than a few hours at a time.  She walks with a walker for the last 8 months mostly because her left leg is swollen and heavy.  She has had a few falls.  He has not had any recent obvious stroke like symptoms.   She has end-stage renal disease and recently underwent AV fistula placement in anticipation for dialysis in the future.  She has not had any brain imaging studies.  Her MMSE: 28/30 PCPs office in February 2024 has declined today to 21/30.   CT head on 01/23/2022 had shown chronic white matter changes only.  ROS:   14 system review of systems is positive for hallucinations, disorientation, confusion, memory loss, poor balance, falls, bruising and all other systems negative  PMH:  Past Medical History:  Diagnosis Date   CAD (coronary artery disease)    cath 5/23 100% dist RCA lesion treated with 2 overlapping Integrity Resolute DES ( 2.25x37mm, 2.25x71mm), 60% mid RCA treated medically, 99% OM2 not amenable to PCI, 70% D1 lesion, EF normal   Hypercholesteremia    Hypertension    Hypothyroidism    Kidney disease    Renal disorder    Stroke (HCC) 10/2018   Thyroid disease    Type 2 diabetes mellitus (HCC) 10/08/2018    Social History:  Social History   Socioeconomic History   Marital status: Married    Spouse name: Not on file   Number of children: Not on file   Years of education: Not on file   Highest education level: Not on file  Occupational History   Not on file  Tobacco Use   Smoking status: Former    Packs/day: .25    Types: Cigarettes    Quit date: 05/13/2021    Years since quitting: 1.5   Smokeless tobacco: Never  Vaping Use   Vaping Use: Never used  Substance and Sexual Activity   Alcohol use: No    Alcohol/week: 0.0 standard drinks of alcohol   Drug use: No  Sexual activity: Not on file  Other Topics Concern   Not on file  Social History Narrative   Not on file   Social Determinants of Health   Financial Resource Strain: Low Risk  (10/25/2021)   Overall Financial Resource Strain (CARDIA)    Difficulty of Paying Living Expenses: Not hard at all  Food Insecurity: No Food Insecurity (03/30/2022)   Hunger Vital Sign    Worried About Running Out of Food in the Last Year: Never true    Ran Out of Food in the Last Year: Never true  Transportation Needs: No Transportation Needs (03/30/2022)   PRAPARE - Administrator, Civil Service (Medical): No    Lack of Transportation  (Non-Medical): No  Physical Activity: Inactive (10/25/2021)   Exercise Vital Sign    Days of Exercise per Week: 0 days    Minutes of Exercise per Session: 0 min  Stress: No Stress Concern Present (10/25/2021)   [employer] of Occupational Health - Occupational Stress Questionnaire    Feeling of Stress : Not at all  Social Connections: Not on file  Intimate Partner Violence: Patient Declined (03/22/2022)   Humiliation, Afraid, Rape, and Kick questionnaire    Fear of Current or Ex-Partner: Patient declined    Emotionally Abused: Patient declined    Physically Abused: Patient declined    Sexually Abused: Patient declined    Medications:   Current Outpatient Medications on File Prior to Visit  Medication Sig Dispense Refill   amLODipine (NORVASC) 10 MG tablet Take 1 tablet by mouth once daily 90 tablet 3   aspirin EC 81 MG tablet Take 1 tablet (81 mg total) by mouth 2 (two) times daily. Hold aspirin while you are on apixaban/Eliquis 1 tablet 0   atorvastatin (LIPITOR) 80 MG tablet TAKE 1 TABLET BY MOUTH ONCE DAILY 6 IN THE EVENING 30 tablet 6   BD PEN NEEDLE NANO 2ND GEN 32G X 4 MM MISC Inject into the skin 3 (three) times daily.     buPROPion (WELLBUTRIN SR) 150 MG 12 hr tablet Take 1 tablet by mouth twice daily 180 tablet 2   carvedilol (COREG) 12.5 MG tablet Take 1 tablet (12.5 mg total) by mouth 2 (two) times daily with a meal. 60 tablet 6   cholecalciferol (VITAMIN D3) 25 MCG (1000 UNIT) tablet Take 1,000 Units by mouth in the morning and at bedtime.     DULoxetine (CYMBALTA) 60 MG capsule Take 1 capsule by mouth once daily 90 capsule 2   ferrous sulfate 325 (65 FE) MG tablet Take 325 mg by mouth 2 (two) times daily with a meal.     furosemide (LASIX) 40 MG tablet Take 2 tablets (80 mg total) by mouth daily. Once a day (Patient taking differently: Take 40-80 mg by mouth See admin instructions. Take 80 mg in the morning and 40 mg at night) 30 tablet    insulin lispro  protamine-lispro (HUMALOG 75/25 MIX) (75-25) 100 UNIT/ML SUSP injection Inject 10-25 Units into the skin See admin instructions. Inject 25 units with breakfast, 10 units at lunch, 25 units at dinner     levothyroxine (SYNTHROID) 150 MCG tablet Take 150 mcg by mouth daily before breakfast.     nitroGLYCERIN (NITROSTAT) 0.4 MG SL tablet Place 0.4 mg under the tongue every 5 (five) minutes as needed for chest pain.     potassium chloride SA (KLOR-CON M) 20 MEQ tablet Take 20 mEq by mouth daily.     sodium bicarbonate 650  MG tablet Take 1 tablet (650 mg total) by mouth 2 (two) times daily. 20 tablet 0   No current facility-administered medications on file prior to visit.    Allergies:   Allergies  Allergen Reactions   Metformin Hcl Other (See Comments)    Unknown reaction     Physical Exam General: Mildly obese pleasant middle-aged Caucasian lady seated, in no evident distress Head: head normocephalic and atraumatic.   Neck: supple with no carotid or supraclavicular bruits Cardiovascular: regular rate and rhythm, no murmurs Musculoskeletal: no deformity Skin:  no rash/petichiae Vascular:  Normal pulses all extremities  Neurologic Exam Mental Status: Awake and fully alert. Oriented to place and time. Recent and remote memory are poor. Attention span, concentration and fund of knowledge diminished. Mood and affect appropriate.  Poor recall 0/3.  Able to name only 6 animals which can walk on 4 legs.  Clock drawing 3/4.  Mental status exam scored 21/30. Cranial Nerves: Fundoscopic exam not done. Pupils equal, briskly reactive to light. Extraocular movements full without nystagmus. Visual fields full to confrontation. Hearing intact. Facial sensation intact. Face, tongue, palate moves normally and symmetrically.  Motor: Normal bulk and tone. Normal strength in all tested extremity muscles with diminished fine finger movements on the left and hyperexcitable left upper extremity.. Sensory.: intact  to touch , pinprick , position and vibratory sensation.  Coordination: Rapid alternating movements normal in all extremities. Finger-to-nose and heel-to-shin performed accurately bilaterally. Gait and Station: Arises from chair with difficulty. Stance is stooped.  Uses a walker.  Drags the left leg .  Gait is broad-based and unsteady. Reflexes: 1+ and symmetric. Toes downgoing.     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  Prior history of left corpus callosum lacunar infarct in 2020 and  old right basal ganglia infarct in 2019     PLAN: I had a long discussion with the patient and her daughter regarding subacute memory loss and cognitive decline and hallucinations likely representing mixed vascular and Alzheimer's dementia.  Recommend further evaluation with checking lab work for reversible causes of dementia, EEG and MRI scan of the brain.  Trial of Namenda starter pack and if tolerated 10 mg twice daily.  Continue aspirin for stroke prevention and maintain aggressive risk factor modification with strict control of hypertension with blood pressure goal below 130/90, lipids with LDL cholesterol goal below 70 mg percent and diabetes with hemoglobin A1c goal below 6.5%.  He was encouraged to use her walker at all times discussed fall prevention precautions.  Return for follow-up in the future in 4 months or call earlier if necessary. Greater than 50% time during this 45-minute consultation visit was spent on counseling and coordination of care  about further management.  Strokes and Delia Heady, MD Note: This document was prepared with digital dictation and possible smart phrase technology. Any transcriptional errors that result from this process are unintentional.

## 2022-11-22 NOTE — Patient Instructions (Signed)
I had a long discussion with the patient and her daughter regarding subacute memory loss and cognitive decline and hallucinations likely representing mixed vascular and Alzheimer's dementia.  Recommend further evaluation with checking lab work for reversible causes of dementia, EEG and MRI scan of the brain.  Trial of Namenda starter pack and if tolerated 10 mg twice daily.  Continue aspirin for stroke prevention and maintain aggressive risk factor modification with strict control of hypertension with blood pressure goal below 130/90, lipids with LDL cholesterol goal below 70 mg percent and diabetes with hemoglobin A1c goal below 6.5%.  He was encouraged to use her walker at all times discussed fall prevention precautions.  Return for follow-up in the future in 4 months or call earlier if necessary.     Dementia Caregiver Guide Dementia is a term used to describe a number of symptoms that affect memory and thinking. The most common symptoms include: Memory loss. Trouble with language and communication. Trouble concentrating. Poor judgment and problems with reasoning. Wandering from home or public places. Extreme anxiety or depression. Being suspicious or having angry outbursts and accusations. Child-like behavior and language. Dementia can be frightening and confusing. And taking care of someone with dementia can be challenging. This guide provides tips to help you when providing care for a person with dementia. How to help manage lifestyle changes Dementia usually gets worse slowly over time. In the early stages, people with dementia can stay independent and safe with some help. In later stages, they need help with daily tasks such as dressing, grooming, and using the bathroom. There are actions you can take to help a person manage his or her life while living with this condition. Communicating When the person is talking or seems frustrated, make eye contact and hold the person's hand. Ask  specific questions that need yes or no answers. Use simple words, short sentences, and a calm voice. Only give one direction at a time. When offering choices, limit the person to just one or two. Avoid correcting the person in a negative way. If the person is struggling to find the right words, gently try to help him or her. Preventing injury  Keep floors clear of clutter. Remove rugs, magazine racks, and floor lamps. Keep hallways well lit, especially at night. Put a handrail and nonslip mat in the bathtub or shower. Put childproof locks on cabinets that contain dangerous items, such as medicines, alcohol, guns, toxic cleaning items, sharp tools or utensils, matches, and lighters. For doors to the outside of the house, put the locks in places where the person cannot see or reach them easily. This will help ensure that the person does not wander out of the house and get lost. Be prepared for emergencies. Keep a list of emergency phone numbers and addresses in a convenient area. Remove car keys and lock garage doors so that the person does not try to get in the car and drive. Have the person wear a bracelet that tracks locations and identifies the person as having memory problems. This should be worn at all times for safety. Helping with daily life  Keep the person on track with his or her routine. Try to identify areas where the person may need help. Be supportive, patient, calm, and encouraging. Gently remind the person that adjusting to changes takes time. Help with the tasks that the person has asked for help with. Keep the person involved in daily tasks and decisions as much as possible. Encourage conversation, but try  not to get frustrated if the person struggles to find words or does not seem to appreciate your help. How to recognize stress Look for signs of stress in yourself and in the person you are caring for. If you notice signs of stress, take steps to manage it. Symptoms of  stress include: Feeling anxious, irritable, frustrated, or angry. Denying that the person has dementia or that his or her symptoms will not improve. Feeling depressed, hopeless, or unappreciated. Difficulty sleeping. Difficulty concentrating. Developing stress-related health problems. Feeling like you have too little time for your own life. Follow these instructions at home: Take care of your health Make sure that you and the person you are caring for: Get regular sleep. Exercise regularly. Eat regular, nutritious meals. Take over-the-counter and prescription medicines only as told by your health care providers. Drink enough fluid to keep your urine pale yellow. Attend all scheduled health care appointments.  General instructions Join a support group with others who are caregivers. Ask about respite care resources. Respite care can provide short-term care for the person so that you can have a regular break from the stress of caregiving. Consider any safety risks and take steps to avoid them. Organize medicines in a pill box for each day of the week. Create a plan to handle any legal or financial matters. Get legal or financial advice if needed. Keep a calendar in a central location to remind the person of appointments or other activities. Where to find support: Many individuals and organizations offer support. These include: Support groups for people with dementia. Support groups for caregivers. Counselors or therapists. Home health care services. Adult day care centers. Where to find more information Centers for Disease Control and Prevention: FootballExhibition.com.br Alzheimer's Association: LimitLaws.hu Family Caregiver Alliance: www.caregiver.org Alzheimer's Foundation of [place]: www.alzfdn.org Contact a health care provider if: The person's health is rapidly getting worse. You are no longer able to care for the person. Caring for the person is affecting your physical and emotional  health. You are feeling depressed or anxious about caring for the person. Get help right away if: The person threatens himself or herself, you, or anyone else. You feel depressed or sad, or feel that you want to harm yourself. If you ever feel like your loved one may hurt himself or herself or others, or if he or she shares thoughts about taking his or her own life, get help right away. You can go to your nearest emergency department or: Call your local emergency services (911 in the [place] Call a suicide crisis helpline, such as the National Suicide Prevention Lifeline at 239-157-1311 or 988 in the [place] This is open 24 hours a day in the [place] Text the Crisis Text Line at (610)565-7466 (in the [place] Summary Dementia is a term used to describe a number of symptoms that affect memory and thinking. Dementia usually gets worse slowly over time. Take steps to reduce the person's risk of injury and to plan for future care. Caregivers need support, relief from caregiving, and time for their own lives. This information is not intended to replace advice given to you by your health care provider. Make sure you discuss any questions you have with your health care provider. Document Revised: 12/23/2020 Document Reviewed: 10/14/2019 Elsevier Patient Education  2024 [employer].

## 2022-11-23 ENCOUNTER — Telehealth: Payer: Self-pay | Admitting: Neurology

## 2022-11-23 LAB — DEMENTIA PANEL
Homocysteine: 22.2 umol/L — ABNORMAL HIGH (ref 0.0–17.2)
RPR Ser Ql: NONREACTIVE
TSH: 1.34 u[IU]/mL (ref 0.450–4.500)
Vitamin B-12: 498 pg/mL (ref 232–1245)

## 2022-11-23 NOTE — Telephone Encounter (Signed)
medicare NPR sent to GI [phone] 

## 2022-11-24 ENCOUNTER — Ambulatory Visit: Payer: PRIVATE HEALTH INSURANCE | Admitting: Internal Medicine

## 2022-11-24 ENCOUNTER — Other Ambulatory Visit: Payer: Self-pay | Admitting: Neurology

## 2022-11-24 MED ORDER — FOLIC ACID 1 MG PO TABS: 1.0000 mg | ORAL_TABLET | Freq: Every day | ORAL | 3 refills | Status: DC

## 2022-11-24 NOTE — Progress Notes (Signed)
Kindly inform the patient that lab work for reversible causes of memory loss was satisfactory except homocystine level is high.  I recommend we start taking folic acid 1 mg daily and in case she smokes cigarettes she needs to quit

## 2022-11-25 ENCOUNTER — Encounter: Payer: Self-pay | Admitting: Internal Medicine

## 2022-11-25 ENCOUNTER — Ambulatory Visit: Payer: Medicare Other | Attending: Internal Medicine | Admitting: Internal Medicine

## 2022-11-25 VITALS — BP 118/74 | HR 72 | Ht 67.0 in | Wt 226.0 lb

## 2022-11-25 DIAGNOSIS — I5030 Unspecified diastolic (congestive) heart failure: Secondary | ICD-10-CM | POA: Insufficient documentation

## 2022-11-25 DIAGNOSIS — I2510 Atherosclerotic heart disease of native coronary artery without angina pectoris: Secondary | ICD-10-CM | POA: Diagnosis present

## 2022-11-25 DIAGNOSIS — I5032 Chronic diastolic (congestive) heart failure: Secondary | ICD-10-CM

## 2022-11-25 NOTE — Progress Notes (Signed)
Cardiology Office Note  Date: 11/25/2022   ID: Jennifer [name], DOB 29-Nov-1955, MRN [id number]  PCP:  [name], MD  Cardiologist:  Marjo Bicker, MD Electrophysiologist:  None   Reason for Office Visit: Follow-up of CAD   History of Present Illness: Jennifer [name] is a 67 y.o. female known to have CAD manifested by NSTEMI in 2017 s/p distal RCA PCI with residual mid RCA 60%, OM2 99% (not amenable to PCI) and D1 70% stenosis with normal LVEF, HTN, HLD, history of CVA, DM 2, thyroid disease presented to cardiology clinic for follow-up visit. Accompanied by daughter and granddaughter.  Patient is here for follow-up visit with me. No interval ER visits or hospitalizations for heart issues. She has an AV fistula created in case she needs HD.  Patient denied any rest or exertional chest discomfort, tightness, heaviness or pressure, rest or exertional dyspnea, palpitations, light-headedness, syncope and LE swelling. Compliant with medications and no side-effects. No bleeding complications.  Patient is not much active at home, can only do ADLs.  Cannot do household chores.   Past Medical History:  Diagnosis Date   CAD (coronary artery disease)    cath 5/23 100% dist RCA lesion treated with 2 overlapping Integrity Resolute DES ( 2.25x61mm, 2.25x40mm), 60% mid RCA treated medically, 99% OM2 not amenable to PCI, 70% D1 lesion, EF normal   Hypercholesteremia    Hypertension    Hypothyroidism    Kidney disease    Renal disorder    Stroke (HCC) 10/2018   Thyroid disease    Type 2 diabetes mellitus (HCC) 10/08/2018    Past Surgical History:  Procedure Laterality Date   ABDOMINAL HYSTERECTOMY     AV FISTULA PLACEMENT Right 06/28/2022   Procedure: RIGHT ARM ARTERIOVENOUS (AV) FISTULA CREATION;  Surgeon: Larina Earthly, MD;  Location: [facility];  Service: Vascular;  Laterality: Right;   CARDIAC CATHETERIZATION N/A 11/03/2015   Procedure: Left Heart Cath and Coronary Angiography;   Surgeon: Marykay Lex, MD;  [facility] IN[facility] CV [facility];  Service: Cardiovascular;  Laterality: N/A;   CARDIAC CATHETERIZATION N/A 11/03/2015   Procedure: Coronary Stent Intervention;  Surgeon: Marykay Lex, MD;  Location: [facility] INVASIVE CV LAB;  Service: Cardiovascular;  Laterality: N/A;   CARPAL TUNNEL RELEASE     CORONARY ANGIOPLASTY     1 stent   FISTULA SUPERFICIALIZATION Right 08/16/2022   Procedure: RIGHT ARTERIOVENOUS FISTULA SUPERFICIALIZATION;  Surgeon: Larina Earthly, MD;  Location: [facility];  Service: Vascular;  Laterality: Right;   TOTAL KNEE ARTHROPLASTY Right 04/02/2019   Procedure: TOTAL KNEE ARTHROPLASTY;  Surgeon: Vickki Hearing, MD;  Location: [facility];  Service: Orthopedics;  Laterality: Right;    Current Outpatient Medications  Medication Sig Dispense Refill   amLODipine (NORVASC) 10 MG tablet Take 1 tablet by mouth once daily 90 tablet 3   aspirin EC 81 MG tablet Take 1 tablet (81 mg total) by mouth 2 (two) times daily. Hold aspirin while you are on apixaban/Eliquis 1 tablet 0   atorvastatin (LIPITOR) 80 MG tablet TAKE 1 TABLET BY MOUTH ONCE DAILY 6 IN THE EVENING 30 tablet 6   BD PEN NEEDLE NANO 2ND GEN 32G X 4 MM MISC Inject into the skin 3 (three) times daily.     buPROPion (WELLBUTRIN SR) 150 MG 12 hr tablet Take 1 tablet by mouth twice daily 180 tablet 2   carvedilol (COREG) 12.5 MG tablet Take 1 tablet (12.5 mg total) by mouth  2 (two) times daily with a meal. 60 tablet 6   cholecalciferol (VITAMIN D3) 25 MCG (1000 UNIT) tablet Take 1,000 Units by mouth in the morning and at bedtime.     DULoxetine (CYMBALTA) 60 MG capsule Take 1 capsule by mouth once daily 90 capsule 2   ferrous sulfate 325 (65 FE) MG tablet Take 325 mg by mouth 2 (two) times daily with a meal.     furosemide (LASIX) 80 MG tablet Take 80 mg by mouth 2 (two) times daily.     insulin lispro protamine-lispro (HUMALOG 75/25 MIX) (75-25) 100 UNIT/ML SUSP injection Inject 10-25 Units into the skin See  admin instructions. Inject 25 units with breakfast, 10 units at lunch, 25 units at dinner     levothyroxine (SYNTHROID) 175 MCG tablet 1 tablet in the morning on an empty stomach Orally Once a day     memantine (NAMENDA TITRATION PAK) tablet pack 5 mg/day for =1 week; 5 mg twice daily for =1 week; 15 mg/day given in 5 mg and 10 mg separated doses for =1 week; then 10 mg twice daily 49 tablet 12   memantine (NAMENDA) 10 MG tablet Take 1 tablet (10 mg total) by mouth 2 (two) times daily. 60 tablet 3   nitroGLYCERIN (NITROSTAT) 0.4 MG SL tablet Place 0.4 mg under the tongue every 5 (five) minutes as needed for chest pain.     potassium chloride SA (KLOR-CON M) 20 MEQ tablet Take 20 mEq by mouth daily.     sodium bicarbonate 650 MG tablet Take 1 tablet (650 mg total) by mouth 2 (two) times daily. 20 tablet 0   folic acid (FOLVITE) 1 MG tablet Take 1 tablet (1 mg total) by mouth daily. (Patient not taking: Reported on 11/25/2022) 30 tablet 3   No current facility-administered medications for this visit.   Allergies:  Metformin hcl   Social History: The patient  reports that she quit smoking about 18 months ago. Her smoking use included cigarettes. She smoked an average of .25 packs per day. She has never used smokeless tobacco. She reports that she does not drink alcohol and does not use drugs.   Family History: The patient's family history includes Heart attack in her brother; Heart disease in her mother; Stroke in her sister.   ROS:  Please see the history of present illness. Otherwise, complete review of systems is positive for none.  All other systems are reviewed and negative.   Physical Exam: VS:  BP 118/74   Pulse 72   Ht 5\' 7"  (1.702 m)   Wt 226 lb (102.5 kg)   SpO2 97%   BMI 35.40 kg/m , BMI Body mass index is 35.4 kg/m.  Wt Readings from Last 3 Encounters:  11/25/22 226 lb (102.5 kg)  11/22/22 226 lb 9.6 oz (102.8 kg)  09/14/22 240 lb (108.9 kg)    General: Patient appears  comfortable at rest. HEENT: Conjunctiva and lids normal, oropharynx clear with moist mucosa. Neck: Supple, no elevated JVP or carotid bruits, no thyromegaly. Lungs: Clear to auscultation, nonlabored breathing at rest. Cardiac: Regular rate and rhythm, no S3 or significant systolic murmur, no pericardial rub. Abdomen: Soft, nontender, no hepatomegaly, bowel sounds present, no guarding or rebound. Extremities: 2-3+ pitting edema lower extremities. Skin: Warm and dry. Musculoskeletal: No kyphosis. Neuropsychiatric: Alert and oriented x3, affect grossly appropriate.  ECG:  An ECG dated 05/04/2022 was personally reviewed today and demonstrated:  Normal sinus rhythm and no ST-T changes  Recent Labwork:  03/24/2022: ALT 15; AST 11 03/25/2022: Magnesium 2.0; Platelets 351 08/16/2022: BUN 59; Creatinine, Ser 4.10; Hemoglobin 9.5; Potassium 5.0; Sodium 138 11/22/2022: TSH 1.340     Component Value Date/Time   CHOL 151 03/13/2020 1234   TRIG 96.0 03/13/2020 1234   HDL 48.80 03/13/2020 1234   CHOLHDL 3 03/13/2020 1234   VLDL 19.2 03/13/2020 1234   LDLCALC 83 03/13/2020 1234   LDLDIRECT 154.2 11/04/2011 1018    Other Studies Reviewed Today: Echo from 2020 LVEF preserved PFO present  USG carotids from 2020 No stenosis in R ICA 1-49 percent stenosis in L ICA  Assessment and Plan: Patient is a 67 year old F known to have CAD manifested by NSTEMI in 2017 s/p distal RCA PCI with residual mid RCA 60%, OM2 99% (not amenable to PCI) and D1 70% stenosis with normal LVEF, HTN, HLD, history of CVA, DM 2, 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  #CAD manifested by NSTEMI in 2017 s/p distal RCA PCI with residual mid RCA 60%, OM2 99% (not amenable to PCI) and D1 70% stenosis with normal LVEF, currently angina free -No interval angina. Continue aspirin 81 mg once daily and atorvastatin 80 mg nightly -Patient has residual mid RCA 60% stenosis, OM 99%  stenosis (unamenable to PCI ) and D1 70% stenosis for which medical management was recommended in 2017 -SL NTG 0.4 mg as needed -ER precautions for chest pain  # Chronic diastolic heart failure # CKD stage IV -No DOE, orthopnea and PND symptoms but has 2-3+ pitting edema in bilateral lower EXTR.  Currently on p.o. Lasix 80 mg twice daily which is managed by her nephrologist.  She also has AV fistula in case she needs urgent HD.  # HLD, unknown values -Continue atorvastatin 80 mg nightly.  Goal LDL is <70.  # HTN, controlled -Continue amlodipine 10 mg once daily and carvedilol 12.5 mg twice daily.  # Carotid artery stenosis (1 to 49% in L ICA) -Continue aspirin 81 mg once daily and atorvastatin 80 mg nightly  I have spent a total of 30 minutes with patient reviewing chart, EKGs, labs and examining patient as well as establishing an assessment and plan that was discussed with the patient.  > 50% of time was spent in direct patient care.      Medication Adjustments/Labs and Tests Ordered: Current medicines are reviewed at length with the patient today.  Concerns regarding medicines are outlined above.   Tests Ordered: Orders Placed This Encounter  Procedures   EKG 12-Lead    Medication Changes: No orders of the defined types were placed in this encounter.   Disposition:  Follow up  6 months  Signed Sudie Bandel Verne Spurr, MD, 11/25/2022 1:12 PM    [facility] at [facility] [address] [place], [place], [place] [address]

## 2022-11-25 NOTE — Patient Instructions (Signed)
Medication Instructions:  Your physician recommends that you continue on your current medications as directed. Please refer to the Current Medication list given to you today.  *If you need a refill on your cardiac medications before your next appointment, please call your pharmacy*   Lab Work: NONE   If you have labs (blood work) drawn today and your tests are completely normal, you will receive your results only by: MyChart Message (if you have MyChart) OR A paper copy in the mail If you have any lab test that is abnormal or we need to change your treatment, we will call you to review the results.   Testing/Procedures: NONE    Follow-Up: At [facility], you and your health needs are our priority.  As part of our continuing mission to provide you with exceptional heart care, we have created designated Provider Care Teams.  These Care Teams include your primary Cardiologist (physician) and Advanced Practice Providers (APPs -  Physician Assistants and Nurse Practitioners) who all work together to provide you with the care you need, when you need it.  We recommend signing up for the patient portal called "MyChart".  Sign up information is provided on this After Visit Summary.  MyChart is used to connect with patients for Virtual Visits (Telemedicine).  Patients are able to view lab/test results, encounter notes, upcoming appointments, etc.  Non-urgent messages can be sent to your provider as well.   To learn more about what you can do with MyChart, go to [email or web address].    Your next appointment:   6 month(s)  Provider:   Vishnu Mallipeddi, MD    Other Instructions Thank you for choosing [facility]!    

## 2022-11-28 ENCOUNTER — Encounter: Payer: Self-pay | Admitting: Neurology

## 2022-11-30 ENCOUNTER — Ambulatory Visit: Payer: Self-pay

## 2022-11-30 NOTE — Patient Outreach (Signed)
  Care Coordination   Follow Up Visit Note   11/30/2022 Name: Jennifer [name] MRN: [id number] DOB: 23-Jan-1956  Jennifer [name] is a 67 y.o. year old female who sees [name], Audrie Gallus, MD for primary care. I  spoke with patients daughter, Jennifer [name]  What matters to the patients health and wellness today?  Daughter states patient saw neurologist on 11/22/22. She states patient was started on namenda and MRI and EEG was ordered.  Daughter states patient to have MRI on tomorrow and  EEG on 12/08/22.  Daughter states patient has follow up visit with nephrologist on 12/09/22 to determine if patient will start dialysis. Daughter denies any changes in patients current status.    Goals Addressed             This Visit's Progress    Patient / caregiver statement:  Management of health conditions       Interventions Today    Flowsheet Row Most Recent Value  Chronic Disease   Chronic disease during today's visit Chronic Kidney Disease/End Stage Renal Disease (ESRD), Other  [memory loss]  General Interventions   General Interventions Discussed/Reviewed General Interventions Reviewed, Doctor Visits  [evaluation of current treatment plan for CDKIV and memory loss and patients adherence to plan as establised by provider. Reviewed/discussed with daughter most recent neurology and cardiology visits.]  Doctor Visits Discussed/Reviewed Doctor Visits Reviewed  [Confirmed patient has upcoming nephrology visit on 12/09/22.]  Education Interventions   Education Provided Provided Education  [discussed dementia caregiving.  Confirmed patient received education information from neurology office on dementia caregiving.]  Pharmacy Interventions   Pharmacy Dicussed/Reviewed Pharmacy Topics Reviewed  [medications discussed. Confirmed patient started new prescription for namenda.  medication compliance discussed.]  Safety Interventions   Safety Discussed/Reviewed Safety Discussed  [discussed safety strategies for patients  with memory loss. Stressed importance of patient having 24 hr supervision]               SDOH assessments and interventions completed:  No     Care Coordination Interventions:  Yes, provided   Follow up plan: Follow up call scheduled for 01/02/23    Encounter Outcome:  Pt. Visit Completed   George Ina RN,BSN,CCM [facility] Care Coordination [phone] direct line

## 2022-11-30 NOTE — Patient Instructions (Signed)
Visit Information  Thank you for taking time to visit with me today. Please don't hesitate to contact me if I can be of assistance to you.   Following are the goals we discussed today:  Take medications as prescribed.  Follow up with providers as recommended Patient should have 24 hour supervision See fall safety information provided    Our next appointment is by telephone on 01/02/23 at 10 am  Please call the care guide team at [phone] if you need to cancel or reschedule your appointment.   If you are experiencing a Mental Health or Behavioral Health Crisis or need someone to talk to, please call the Suicide and Crisis Lifeline: 988 call 1-800-273-TALK (toll free, 24 hour hotline)  Patient verbalizes understanding of instructions and care plan provided today and agrees to view in MyChart. Active MyChart status and patient understanding of how to access instructions and care plan via MyChart confirmed with patient.     George Ina RN,BSN,CCM [facility] Care Coordination [phone] direct line  Fall Prevention in the Home, Adult Falls can cause injuries and can happen to people of all ages. There are many things you can do to make your home safer and to help prevent falls. What actions can I take to prevent falls? General information Use good lighting in all rooms. Make sure to: Replace any light bulbs that burn out. Turn on the lights in dark areas and use night-lights. Keep items that you use often in easy-to-reach places. Lower the shelves around your home if needed. Move furniture so that there are clear paths around it. Do not use throw rugs or other things on the floor that can make you trip. If any of your floors are uneven, fix them. Add color or contrast paint or tape to clearly mark and help you see: Grab bars or handrails. First and last steps of staircases. Where the edge of each step is. If you use a ladder or stepladder: Make sure that it is fully opened. Do not  climb a closed ladder. Make sure the sides of the ladder are locked in place. Have someone hold the ladder while you use it. Know where your pets are as you move through your home. What can I do in the bathroom?     Keep the floor dry. Clean up any water on the floor right away. Remove soap buildup in the bathtub or shower. Buildup makes bathtubs and showers slippery. Use non-skid mats or decals on the floor of the bathtub or shower. Attach bath mats securely with double-sided, non-slip rug tape. If you need to sit down in the shower, use a non-slip stool. Install grab bars by the toilet and in the bathtub and shower. Do not use towel bars as grab bars. What can I do in the bedroom? Make sure that you have a light by your bed that is easy to reach. Do not use any sheets or blankets on your bed that hang to the floor. Have a firm chair or bench with side arms that you can use for support when you get dressed. What can I do in the kitchen? Clean up any spills right away. If you need to reach something above you, use a step stool with a grab bar. Keep electrical cords out of the way. Do not use floor polish or wax that makes floors slippery. What can I do with my stairs? Do not leave anything on the stairs. Make sure that you have a light switch at  the top and the bottom of the stairs. Make sure that there are handrails on both sides of the stairs. Fix handrails that are broken or loose. Install non-slip stair treads on all your stairs if they do not have carpet. Avoid having throw rugs at the top or bottom of the stairs. Choose a carpet that does not hide the edge of the steps on the stairs. Make sure that the carpet is firmly attached to the stairs. Fix carpet that is loose or worn. What can I do on the outside of my home? Use bright outdoor lighting. Fix the edges of walkways and driveways and fix any cracks. Clear paths of anything that can make you trip, such as tools or rocks. Add  color or contrast paint or tape to clearly mark and help you see anything that might make you trip as you walk through a door, such as a raised step or threshold. Trim any bushes or trees on paths to your home. Check to see if handrails are loose or broken and that both sides of all steps have handrails. Install guardrails along the edges of any raised decks and porches. Have leaves, snow, or ice cleared regularly. Use sand, salt, or ice melter on paths if you live where there is ice and snow during the winter. Clean up any spills in your garage right away. This includes grease or oil spills. What other actions can I take? Review your medicines with your doctor. Some medicines can cause dizziness or changes in blood pressure, which increase your risk of falling. Wear shoes that: Have a low heel. Do not wear high heels. Have rubber bottoms and are closed at the toe. Feel good on your feet and fit well. Use tools that help you move around if needed. These include: Canes. Walkers. Scooters. Crutches. Ask your doctor what else you can do to help prevent falls. This may include seeing a physical therapist to learn to do exercises to move better and get stronger. Where to find more information Centers for Disease Control and Prevention, STEADI: TonerPromos.no [employer] on Aging: BaseRingTones.pl National Institute on Aging: BaseRingTones.pl Contact a doctor if: You are afraid of falling at home. You feel weak, drowsy, or dizzy at home. You fall at home. Get help right away if you: Lose consciousness or have trouble moving after a fall. Have a fall that causes a head injury. These symptoms may be an emergency. Get help right away. Call 911. Do not wait to see if the symptoms will go away. Do not drive yourself to the hospital. This information is not intended to replace advice given to you by your health care provider. Make sure you discuss any questions you have with your health care  provider. Document Revised: 01/31/2022 Document Reviewed: 01/31/2022 Elsevier Patient Education  2024 [employer].

## 2022-12-01 ENCOUNTER — Ambulatory Visit
Admission: RE | Admit: 2022-12-01 | Discharge: 2022-12-01 | Disposition: A | Discharge status: Home / Self Care | Payer: Medicare Other | Source: Ambulatory Visit | Attending: Neurology | Admitting: Neurology

## 2022-12-01 DIAGNOSIS — R413 Other amnesia: Secondary | ICD-10-CM | POA: Diagnosis not present

## 2022-12-08 ENCOUNTER — Ambulatory Visit: Payer: Medicare Other | Admitting: Neurology

## 2022-12-08 DIAGNOSIS — R4182 Altered mental status, unspecified: Secondary | ICD-10-CM | POA: Diagnosis not present

## 2022-12-08 DIAGNOSIS — R413 Other amnesia: Secondary | ICD-10-CM

## 2022-12-09 NOTE — Progress Notes (Signed)
Kindly inform patient that MRI brain shows old small strokes in bilateral deep and back portions of the brain.No acute findings

## 2022-12-09 NOTE — Progress Notes (Signed)
Kindly inform the patient that EEG or brainwave study showed slowing of brain wave activity which may be seen in patients with memory disorders.  No seizure activity noted.

## 2022-12-12 ENCOUNTER — Encounter: Payer: Self-pay | Admitting: Neurology

## 2022-12-19 ENCOUNTER — Other Ambulatory Visit: Payer: Self-pay

## 2022-12-19 ENCOUNTER — Telehealth: Payer: Self-pay

## 2022-12-19 DIAGNOSIS — D631 Anemia in chronic kidney disease: Secondary | ICD-10-CM | POA: Insufficient documentation

## 2022-12-19 NOTE — Telephone Encounter (Signed)
Auth Submission: NO AUTH NEEDED Site of care: Site of care: [facility] Payer: Medicare & [employer] Supplement Medication & CPT/J Code(s) submitted: Feraheme (ferumoxytol) [id number] Route of submission (phone, fax, portal):  Phone # Fax # Auth type: Buy/Bill Units/visits requested: 510mg  x 2 doses  Approval from: 12/19/2022 to 04/21/2023

## 2022-12-23 ENCOUNTER — Encounter (HOSPITAL_COMMUNITY)
Admission: RE | Admit: 2022-12-23 | Discharge: 2022-12-23 | Disposition: A | Discharge status: Home / Self Care | Payer: Medicare Other | Source: Ambulatory Visit | Attending: Family Medicine | Admitting: Family Medicine

## 2022-12-23 VITALS — BP 202/63 | HR 76 | Temp 98.1°F | Resp 18

## 2022-12-23 DIAGNOSIS — N189 Chronic kidney disease, unspecified: Secondary | ICD-10-CM | POA: Insufficient documentation

## 2022-12-23 DIAGNOSIS — D631 Anemia in chronic kidney disease: Secondary | ICD-10-CM | POA: Insufficient documentation

## 2022-12-23 MED ORDER — SODIUM CHLORIDE 0.9 % IV SOLN
510.0000 mg | Freq: Once | INTRAVENOUS | Status: DC
  Filled 2022-12-23: qty 17

## 2022-12-23 MED ORDER — ACETAMINOPHEN 325 MG PO TABS: 650.0000 mg | ORAL_TABLET | Freq: Once | ORAL | Status: DC

## 2022-12-23 MED ORDER — DIPHENHYDRAMINE HCL 25 MG PO CAPS: 25.0000 mg | ORAL_CAPSULE | Freq: Once | ORAL | Status: DC

## 2022-12-23 NOTE — Progress Notes (Signed)
Pt arrived for first feraheme infusion.  B/P 205/68, at recheck it was 202/63.  Pt did not take any of her blood pressure medications this am. Called and spoke with nurse amber at [facility].  Pt needs to be rescheduled and make sure she takes her blood pressure mediation before arriving for infusion appt. Explained to pt to make sure she takes her blood pressure medications when she arrives home and recheck her blood pressure.  Pt and daughter verbalized understanding.

## 2022-12-23 NOTE — Addendum Note (Signed)
Encounter addended by: Arrie Senate, RN on: 12/23/2022 9:56 AM  Actions taken: MAR administration accepted

## 2022-12-30 ENCOUNTER — Encounter (HOSPITAL_COMMUNITY): Payer: Medicare Other

## 2022-12-30 ENCOUNTER — Encounter (HOSPITAL_COMMUNITY)
Admission: RE | Admit: 2022-12-30 | Discharge: 2022-12-30 | Disposition: A | Discharge status: Home / Self Care | Payer: Medicare Other | Source: Ambulatory Visit | Attending: Family Medicine | Admitting: Family Medicine

## 2022-12-30 NOTE — Progress Notes (Signed)
Contacted patients daughter [name] and informed her that her appointment would be cancelled for today due to computers being shut down. Informed [name] that we would call next week to reschedule appointment.

## 2023-01-06 ENCOUNTER — Encounter (HOSPITAL_COMMUNITY)
Admission: RE | Admit: 2023-01-06 | Discharge: 2023-01-06 | Disposition: A | Discharge status: Home / Self Care | Payer: Medicare Other | Source: Ambulatory Visit | Attending: Family Medicine | Admitting: Family Medicine

## 2023-01-06 VITALS — BP 190/64 | HR 73 | Temp 98.8°F | Resp 18

## 2023-01-06 DIAGNOSIS — N189 Chronic kidney disease, unspecified: Secondary | ICD-10-CM | POA: Diagnosis present

## 2023-01-06 DIAGNOSIS — D631 Anemia in chronic kidney disease: Secondary | ICD-10-CM

## 2023-01-06 MED ORDER — SODIUM CHLORIDE 0.9 % IV SOLN
510.0000 mg | Freq: Once | INTRAVENOUS | Status: AC
  Administered 2023-01-06: 510 mg via INTRAVENOUS
  Filled 2023-01-06: qty 510

## 2023-01-06 MED ORDER — DIPHENHYDRAMINE HCL 25 MG PO CAPS
25.0000 mg | ORAL_CAPSULE | Freq: Once | ORAL | Status: AC
  Administered 2023-01-06: 25 mg via ORAL

## 2023-01-06 MED ORDER — ACETAMINOPHEN 325 MG PO TABS
650.0000 mg | ORAL_TABLET | Freq: Once | ORAL | Status: AC
  Administered 2023-01-06: 650 mg via ORAL

## 2023-01-06 NOTE — Addendum Note (Signed)
Encounter addended by: Arrie Senate, RN on: 01/06/2023 12:51 PM  Actions taken: Therapy plan modified

## 2023-01-06 NOTE — Progress Notes (Signed)
Diagnosis: Iron Deficiency Anemia  Provider:  Crista Elliot MD  Procedure: IV Infusion  IV Type: Peripheral, IV Location: L Antecubital  Feraheme (Ferumoxytol), Dose: 510 mg  Infusion Start Time: 1118  Infusion Stop Time: 1139  Post Infusion IV Care: Observation period completed  Discharge: Condition: Good, Destination: Home . AVS Provided  Performed by:  Daleen Squibb, RN

## 2023-01-06 NOTE — Addendum Note (Signed)
Encounter addended by: Arrie Senate, RN on: 01/06/2023 12:53 PM  Actions taken: LDA properties accepted

## 2023-01-13 ENCOUNTER — Encounter (HOSPITAL_COMMUNITY): Admission: RE | Admit: 2023-01-13 | Payer: Medicare Other | Source: Ambulatory Visit

## 2023-01-13 ENCOUNTER — Encounter (HOSPITAL_COMMUNITY): Payer: Medicare Other | Attending: Nephrology | Admitting: Emergency Medicine

## 2023-01-13 VITALS — BP 182/62 | HR 69 | Temp 98.0°F | Resp 18

## 2023-01-13 DIAGNOSIS — D509 Iron deficiency anemia, unspecified: Secondary | ICD-10-CM | POA: Diagnosis present

## 2023-01-13 DIAGNOSIS — D631 Anemia in chronic kidney disease: Secondary | ICD-10-CM | POA: Insufficient documentation

## 2023-01-13 DIAGNOSIS — N189 Chronic kidney disease, unspecified: Secondary | ICD-10-CM | POA: Insufficient documentation

## 2023-01-13 MED ORDER — DIPHENHYDRAMINE HCL 25 MG PO CAPS
25.0000 mg | ORAL_CAPSULE | Freq: Once | ORAL | Status: AC
  Administered 2023-01-13: 25 mg via ORAL

## 2023-01-13 MED ORDER — ACETAMINOPHEN 325 MG PO TABS
650.0000 mg | ORAL_TABLET | Freq: Once | ORAL | Status: AC
  Administered 2023-01-13: 650 mg via ORAL

## 2023-01-13 MED ORDER — SODIUM CHLORIDE 0.9 % IV SOLN
510.0000 mg | Freq: Once | INTRAVENOUS | Status: DC
  Filled 2023-01-13: qty 17

## 2023-01-13 NOTE — Progress Notes (Signed)
Diagnosis: Iron Deficiency Anemia  Provider:   Teddy Spike , MD  Procedure: IV Infusion  IV Type: Peripheral, IV Location: L Antecubital  Feraheme (Ferumoxytol), Dose: 510 mg  Infusion Start Time: 1125  Infusion Stop Time: 1141  Post Infusion IV Care: Observation period completed  Discharge: Condition: Good, Destination: Home . AVS Declined  Performed by:  Daleen Squibb, RN

## 2023-01-16 NOTE — Progress Notes (Signed)
Diagnosis: Iron Deficiency Anemia  Provider:   Teddy Spike , MD  Procedure: IV Infusion  IV Type: Peripheral, IV Location: L Antecubital  Feraheme (Ferumoxytol), Dose: 510 mg  Infusion Start Time: 1125  Infusion Stop Time: 1141  Post Infusion IV Care: Observation period completed  Discharge: Condition: Good, Destination: Home . AVS Declined  Performed by:  Marin Shutter, RN

## 2023-02-22 ENCOUNTER — Encounter: Payer: Self-pay | Admitting: Neurology

## 2023-02-22 ENCOUNTER — Ambulatory Visit: Payer: Self-pay

## 2023-02-22 MED ORDER — MEMANTINE HCL 10 MG PO TABS: 10.0000 mg | ORAL_TABLET | Freq: Two times a day (BID) | ORAL | 1 refills | Status: DC

## 2023-02-22 NOTE — Patient Instructions (Signed)
Visit Information  Thank you for taking time to visit with me today. Please don't hesitate to contact me if I can be of assistance to you.   Following are the goals we discussed today:   Goals Addressed             This Visit's Progress    Patient / caregiver statement:  Management of health conditions       Interventions Today    Flowsheet Row Most Recent Value  Chronic Disease   Chronic disease during today's visit Chronic Kidney Disease/End Stage Renal Disease (ESRD), Other  [memory loss]  General Interventions   General Interventions Discussed/Reviewed General Interventions Reviewed, Doctor Visits, Labs  [place] of current treatment plan for mentioned health conditions and patients adherence to plan as established by provider.  Discussed, BP readings,  memory status changes/ update and decision regarding dialysis]  Labs Kidney Function  Doctor Visits Discussed/Reviewed Doctor Visits Reviewed  [reviewed upcoming provider visits. Advised to keep follow up visits with provider]  Pharmacy Interventions   Pharmacy Dicussed/Reviewed Pharmacy Topics Reviewed  [discussed recent medication additions/ changes.  Advised to take medications as prescribed.]               Our next appointment is by telephone on 9/26/243 at 3 pm  Please call the care guide team at [phone] if you need to cancel or reschedule your appointment.   If you are experiencing a Mental Health or Behavioral Health Crisis or need someone to talk to, please call the Suicide and Crisis Lifeline: 988 call 1-800-273-TALK (toll free, 24 hour hotline)  Patient verbalizes understanding of instructions and care plan provided today and agrees to view in MyChart. Active MyChart status and patient understanding of how to access instructions and care plan via MyChart confirmed with patient.     George Ina RN,BSN,CCM [facility] Care Coordination [phone] direct line

## 2023-02-22 NOTE — Patient Outreach (Signed)
  Care Coordination   Follow Up Visit Note   02/22/2023 Name: Jennifer [name] MRN: [id number] DOB: [birth year]  Jennifer [name] is a 67 y.o. year old female who sees [name], Audrie Gallus, MD for primary care. I  spoke with daughter, Jennifer [name].   What matters to the patients health and wellness today?  Daughter states patient received iron infusions. She states her iron medication has been discontinued.  Daughter states patient has been officially diagnosed with beginning stages of Dementia after having MRI/ EEG.  She states patients neurologist prescribed her medication.  Daughter states patient will see nephrologist tomorrow to discuss starting home dialysis. Daughter states she and her sister will be assisting patient with the dialysis. Daughter states she and her sister have to do 5 weeks of training related to the in home dialysis.  Daughter states patients blood pressure medication changed.  She report patients blood pressures are ranging in the 130's/70's   Goals Addressed             This Visit's Progress    Patient / caregiver statement:  Management of health conditions       Interventions Today    Flowsheet Row Most Recent Value  Chronic Disease   Chronic disease during today's visit Chronic Kidney Disease/End Stage Renal Disease (ESRD), Other  [memory loss]  General Interventions   General Interventions Discussed/Reviewed General Interventions Reviewed, Doctor Visits, Labs  [place] of current treatment plan for mentioned health conditions and patients adherence to plan as established by provider.  Discussed, BP readings,  memory status changes/ update and decision regarding dialysis]  Labs Kidney Function  Doctor Visits Discussed/Reviewed Doctor Visits Reviewed  [reviewed upcoming provider visits. Advised to keep follow up visits with provider]  Pharmacy Interventions   Pharmacy Dicussed/Reviewed Pharmacy Topics Reviewed  [discussed recent medication additions/ changes.  Advised to  take medications as prescribed.]               SDOH assessments and interventions completed:  No     Care Coordination Interventions:  Yes, provided   Follow up plan: Follow up call scheduled for 03/09/23    Encounter Outcome:  Patient Visit Completed   George Ina RN,BSN,CCM [facility] Care Coordination [phone] direct line

## 2023-03-03 DIAGNOSIS — S82892A Other fracture of left lower leg, initial encounter for closed fracture: Secondary | ICD-10-CM | POA: Insufficient documentation

## 2023-03-03 DIAGNOSIS — E871 Hypo-osmolality and hyponatremia: Secondary | ICD-10-CM | POA: Insufficient documentation

## 2023-03-03 DIAGNOSIS — Z87891 Personal history of nicotine dependence: Secondary | ICD-10-CM | POA: Insufficient documentation

## 2023-03-03 DIAGNOSIS — Z96651 Presence of right artificial knee joint: Secondary | ICD-10-CM | POA: Insufficient documentation

## 2023-03-06 DIAGNOSIS — E119 Type 2 diabetes mellitus without complications: Secondary | ICD-10-CM | POA: Insufficient documentation

## 2023-03-06 DIAGNOSIS — N2581 Secondary hyperparathyroidism of renal origin: Secondary | ICD-10-CM | POA: Insufficient documentation

## 2023-03-06 DIAGNOSIS — F0280 Dementia in other diseases classified elsewhere without behavioral disturbance: Secondary | ICD-10-CM | POA: Insufficient documentation

## 2023-03-06 DIAGNOSIS — I120 Hypertensive chronic kidney disease with stage 5 chronic kidney disease or end stage renal disease: Secondary | ICD-10-CM | POA: Insufficient documentation

## 2023-03-06 DIAGNOSIS — E559 Vitamin D deficiency, unspecified: Secondary | ICD-10-CM | POA: Insufficient documentation

## 2023-03-07 ENCOUNTER — Ambulatory Visit (INDEPENDENT_AMBULATORY_CARE_PROVIDER_SITE_OTHER): Payer: Medicare Other

## 2023-03-07 VITALS — Ht 66.0 in | Wt 218.0 lb

## 2023-03-07 DIAGNOSIS — Z0000 Encounter for general adult medical examination without abnormal findings: Secondary | ICD-10-CM | POA: Diagnosis not present

## 2023-03-07 NOTE — Progress Notes (Signed)
Subjective:   Jennifer [name] is a 67 y.o. female who presents for Medicare Annual (Subsequent) preventive examination.  Visit Complete: Virtual  I connected with  Janine Limbo on 03/07/23 by a audio enabled telemedicine application and verified that I am speaking with the correct person using two identifiers.  Patient Location: Home  Provider Location: Home Office  I discussed the limitations of evaluation and management by telemedicine. The patient expressed understanding and agreed to proceed.  Patient Medicare AWV questionnaire was completed by the patient on 03/07/2023; I have confirmed that all information answered by patient is correct and no changes since this date.  Cardiac Risk Factors include: advanced age (>18men, >58 women);diabetes mellitus;dyslipidemia;hypertensionVital Signs: Unable to obtain new vitals due to this being a telehealth visit.      Objective:    Today's Vitals   03/07/23 0902  Weight: 218 lb (98.9 kg)  Height: 5\' 6"  (1.676 m)   Body mass index is 35.19 kg/m.     03/07/2023    9:06 AM 03/22/2022    2:49 PM 01/23/2022    4:18 PM 11/20/2021    9:11 AM 11/19/2021    5:59 PM 10/25/2021    2:16 PM 02/05/2020    7:15 PM  Advanced Directives  Does Patient Have a Medical Advance Directive? No No No No No No No  Would patient like information on creating a medical advance directive? Yes (MAU/Ambulatory/Procedural Areas - Information given) No - Patient declined  No - Patient declined No - Patient declined      Current Medications (verified) Outpatient Encounter Medications as of 03/07/2023  Medication Sig   amLODipine (NORVASC) 10 MG tablet Take 1 tablet by mouth once daily   aspirin EC 81 MG tablet Take 1 tablet (81 mg total) by mouth 2 (two) times daily. Hold aspirin while you are on apixaban/Eliquis   atorvastatin (LIPITOR) 80 MG tablet TAKE 1 TABLET BY MOUTH ONCE DAILY 6 IN THE EVENING   BD PEN NEEDLE NANO 2ND GEN 32G X 4 MM MISC Inject into the skin  3 (three) times daily.   buPROPion (WELLBUTRIN SR) 150 MG 12 hr tablet Take 1 tablet by mouth twice daily   carvedilol (COREG) 12.5 MG tablet Take 1 tablet (12.5 mg total) by mouth 2 (two) times daily with a meal.   cholecalciferol (VITAMIN D3) 25 MCG (1000 UNIT) tablet Take 1,000 Units by mouth in the morning and at bedtime.   DULoxetine (CYMBALTA) 60 MG capsule Take 1 capsule by mouth once daily   ferrous sulfate 325 (65 FE) MG tablet Take 325 mg by mouth 2 (two) times daily with a meal.   folic acid (FOLVITE) 1 MG tablet Take 1 tablet (1 mg total) by mouth daily.   furosemide (LASIX) 80 MG tablet Take 80 mg by mouth 2 (two) times daily.   insulin lispro protamine-lispro (HUMALOG 75/25 MIX) (75-25) 100 UNIT/ML SUSP injection Inject 10-25 Units into the skin See admin instructions. Inject 25 units with breakfast, 10 units at lunch, 25 units at dinner   levothyroxine (SYNTHROID) 175 MCG tablet 1 tablet in the morning on an empty stomach Orally Once a day   memantine (NAMENDA) 10 MG tablet Take 1 tablet (10 mg total) by mouth 2 (two) times daily.   nitroGLYCERIN (NITROSTAT) 0.4 MG SL tablet Place 0.4 mg under the tongue every 5 (five) minutes as needed for chest pain.   potassium chloride SA (KLOR-CON M) 20 MEQ tablet Take 20 mEq by mouth  daily.   sodium bicarbonate 650 MG tablet Take 1 tablet (650 mg total) by mouth 2 (two) times daily.   No facility-administered encounter medications on file as of 03/07/2023.    Allergies (verified) Metformin hcl   History: Past Medical History:  Diagnosis Date   CAD (coronary artery disease)    cath 5/23 100% dist RCA lesion treated with 2 overlapping Integrity Resolute DES ( 2.25x17mm, 2.25x62mm), 60% mid RCA treated medically, 99% OM2 not amenable to PCI, 70% D1 lesion, EF normal   Hypercholesteremia    Hypertension    Hypothyroidism    Kidney disease    Renal disorder    Stroke (HCC) 10/2018   Thyroid disease    Type 2 diabetes mellitus (HCC)  10/08/2018   Past Surgical History:  Procedure Laterality Date   ABDOMINAL HYSTERECTOMY     AV FISTULA PLACEMENT Right 06/28/2022   Procedure: RIGHT ARM ARTERIOVENOUS (AV) FISTULA CREATION;  Surgeon: Larina Earthly, MD;  Location: [facility];  Service: Vascular;  Laterality: Right;   CARDIAC CATHETERIZATION N/A 11/03/2015   Procedure: Left Heart Cath and Coronary Angiography;  Surgeon: Marykay Lex, MD;  Location: [facility] INVASIVE CV LAB;  Service: Cardiovascular;  Laterality: N/A;   CARDIAC CATHETERIZATION N/A 11/03/2015   Procedure: Coronary Stent Intervention;  Surgeon: Marykay Lex, MD;  Location: [facility] INVASIVE CV LAB;  Service: Cardiovascular;  Laterality: N/A;   CARPAL TUNNEL RELEASE     CORONARY ANGIOPLASTY     1 stent   FISTULA SUPERFICIALIZATION Right 08/16/2022   Procedure: RIGHT ARTERIOVENOUS FISTULA SUPERFICIALIZATION;  Surgeon: Larina Earthly, MD;  Location: [facility];  Service: Vascular;  Laterality: Right;   TOTAL KNEE ARTHROPLASTY Right 04/02/2019   Procedure: TOTAL KNEE ARTHROPLASTY;  Surgeon: Vickki Hearing, MD;  Location: [facility];  Service: Orthopedics;  Laterality: Right;   Family History  Problem Relation Age of Onset   Heart disease Mother    Stroke Sister    Heart attack Brother    Breast cancer Neg Hx    Social History   Socioeconomic History   Marital status: Married    Spouse name: Not on file   Number of children: Not on file   Years of education: Not on file   Highest education level: Not on file  Occupational History   Not on file  Tobacco Use   Smoking status: Former    Current packs/day: 0.00    Types: Cigarettes    Quit date: 05/13/2021    Years since quitting: 1.8   Smokeless tobacco: Never  Vaping Use   Vaping status: Never Used  Substance and Sexual Activity   Alcohol use: No    Alcohol/week: 0.0 standard drinks of alcohol   Drug use: No   Sexual activity: Not on file  Other Topics Concern   Not on file  Social History Narrative   Not on  file   Social Determinants of Health   Financial Resource Strain: Low Risk  (03/07/2023)   Overall Financial Resource Strain (CARDIA)    Difficulty of Paying Living Expenses: Not hard at all  Food Insecurity: No Food Insecurity (03/07/2023)   Hunger Vital Sign    Worried About Running Out of Food in the Last Year: Never true    Ran Out of Food in the Last Year: Never true  Transportation Needs: No Transportation Needs (03/07/2023)   PRAPARE - Administrator, Civil Service (Medical): No    Lack of Transportation (Non-Medical): No  Physical Activity: Inactive (03/07/2023)   Exercise Vital Sign    Days of Exercise per Week: 0 days    Minutes of Exercise per Session: 0 min  Stress: No Stress Concern Present (03/07/2023)   [employer] of Occupational Health - Occupational Stress Questionnaire    Feeling of Stress : Not at all  Social Connections: Socially Integrated (03/07/2023)   Social Connection and Isolation Panel [NHANES]    Frequency of Communication with Friends and Family: More than three times a week    Frequency of Social Gatherings with Friends and Family: More than three times a week    Attends Religious Services: More than 4 times per year    Active Member of [employer] or Organizations: Yes    Attends Engineer, structural: More than 4 times per year    Marital Status: Married    Tobacco Counseling Counseling given: Not Answered   Clinical Intake:  Pre-visit preparation completed: Yes  Pain : No/denies pain     Nutritional Risks: None Diabetes: No  How often do you need to have someone help you when you read instructions, pamphlets, or other written materials from your doctor or pharmacy?: 1 - Never  Interpreter Needed?: No  Information entered by :: Renie Ora, LPN   Activities of Daily Living    03/07/2023    9:06 AM 06/28/2022    7:01 AM  In your present state of health, do you have any difficulty performing the following  activities:  Hearing? 0 0  Vision? 0 0  Difficulty concentrating or making decisions? 0 0  Walking or climbing stairs? 0 1  Dressing or bathing? 0 1  Doing errands, shopping? 0   Preparing Food and eating ? N   Using the Toilet? N   In the past six months, have you accidently leaked urine? N   Do you have problems with loss of bowel control? N   Managing your Medications? N   Managing your Finances? N   Housekeeping or managing your Housekeeping? N     Patient Care Team: [name], Audrie Gallus, MD as PCP - General Mallipeddi, Orion Modest, MD as PCP - Cardiology (Cardiology) McDonald, Rachelle Hora, DPM as Consulting Physician (Podiatry) Otho Ket, RN as [facility] Care Management  Indicate any recent Medical Services you may have received from other than [facility] providers in the past year (date may be approximate).     Assessment:   This is a routine wellness examination for [facility].  Hearing/Vision screen Vision Screening - Comments:: Daughter declines due to Health issues    Goals Addressed             This Visit's Progress    Cut out extra servings         Depression Screen    03/07/2023    9:05 AM 07/26/2022   11:02 AM 01/26/2022    9:44 AM 10/25/2021    2:18 PM 07/27/2021   10:04 AM 09/18/2017   10:16 AM  PHQ 2/9 Scores  PHQ - 2 Score 0 3 0 0 0 3  PHQ- 9 Score  7    10    Fall Risk    03/07/2023    9:04 AM 07/26/2022   11:01 AM 01/26/2022    9:44 AM 12/01/2021   10:42 AM 10/25/2021    2:17 PM  Fall Risk   Falls in the past year? 1 1 1  0 1  Comment     legs gives out  Number falls in past yr: 1 1 1  1   Injury with Fall? 1 0 1  1  Risk for fall due to : History of fall(s);Impaired balance/gait;Orthopedic patient History of fall(s) Impaired balance/gait  Medication side effect;Impaired mobility  Follow up Education provided;Falls prevention discussed;Falls evaluation completed Falls evaluation completed  Falls evaluation completed Falls evaluation  completed;Education provided;Falls prevention discussed    MEDICARE RISK AT HOME: Medicare Risk at Home Any stairs in or around the home?: No If so, are there any without handrails?: No Home free of loose throw rugs in walkways, pet beds, electrical cords, etc?: Yes Adequate lighting in your home to reduce risk of falls?: Yes Life alert?: No Use of a cane, walker or w/c?: No Grab bars in the bathroom?: Yes Shower chair or bench in shower?: Yes Elevated toilet seat or a handicapped toilet?: Yes  TIMED UP AND GO:  Was the test performed?  No    Cognitive Function:    11/22/2022    1:39 PM  MMSE - Mini Mental State Exam  Orientation to time 1  Orientation to Place 5  Registration 3  Attention/ Calculation 1  Recall 3  Language- name 2 objects 2  Language- repeat 1  Language- follow 3 step command 3  Language- read & follow direction 0  Write a sentence 1  Copy design 1  Total score 21        03/07/2023    9:07 AM  6CIT Screen  What Year? 0 points  What month? 0 points  What time? 0 points  Count back from 20 0 points  Months in reverse 0 points  Repeat phrase 0 points  Total Score 0 points    Immunizations Immunization History  Administered Date(s) Administered   Fluad Quad(high Dose 65+) 07/13/2021, 07/26/2022   Influenza Whole 04/11/2005   Influenza,inj,Quad PF,6+ Mos 04/02/2014, 07/05/2016, 02/25/2019, 03/13/2020   Pneumococcal Polysaccharide-23 05/19/2000, 11/04/2011, 10/17/2017   Td 09/29/1997   Tdap 11/04/2011    TDAP status: Due, Education has been provided regarding the importance of this vaccine. Advised may receive this vaccine at local pharmacy or Health Dept. Aware to provide a copy of the vaccination record if obtained from local pharmacy or Health Dept. Verbalized acceptance and understanding.  Flu Vaccine status: Due, Education has been provided regarding the importance of this vaccine. Advised may receive this vaccine at local pharmacy or  Health Dept. Aware to provide a copy of the vaccination record if obtained from local pharmacy or Health Dept. Verbalized acceptance and understanding.  Pneumococcal vaccine status: Up to date  Covid-19 vaccine status: Declined, Education has been provided regarding the importance of this vaccine but patient still declined. Advised may receive this vaccine at local pharmacy or Health Dept.or vaccine clinic. Aware to provide a copy of the vaccination record if obtained from local pharmacy or Health Dept. Verbalized acceptance and understanding.  Qualifies for Shingles Vaccine? Yes   Zostavax completed No   Shingrix Completed?: No.    Education has been provided regarding the importance of this vaccine. Patient has been advised to call insurance company to determine out of pocket expense if they have not yet received this vaccine. Advised may also receive vaccine at local pharmacy or Health Dept. Verbalized acceptance and understanding.  Screening Tests Health Maintenance  Topic Date Due   Zoster Vaccines- Shingrix (1 of 2) Never done   Diabetic kidney evaluation - Urine ACR  07/30/2009   FOOT EXAM  09/19/2018   Pneumonia Vaccine  34+ Years old (3 of 3 - PCV) 10/18/2018   DEXA SCAN  04/28/2020   OPHTHALMOLOGY EXAM  05/28/2021   DTaP/Tdap/Td (3 - Td or Tdap) 11/03/2021   HEMOGLOBIN A1C  09/24/2022   INFLUENZA VACCINE  01/12/2023   COVID-19 Vaccine (1 - 2023-24 season) Never done   Colonoscopy  05/28/2024 (Originally 04/28/2000)   MAMMOGRAM  02/27/2025 (Originally 12/11/2022)   Hepatitis C Screening  09/18/2037 (Originally [birth year]28/1973)   Diabetic kidney evaluation - eGFR measurement  06/29/2023   Medicare Annual Wellness (AWV)  03/06/2024   HPV VACCINES  Aged Out    Health Maintenance  Health Maintenance Due  Topic Date Due   Zoster Vaccines- Shingrix (1 of 2) Never done   Diabetic kidney evaluation - Urine ACR  07/30/2009   FOOT EXAM  09/19/2018   Pneumonia Vaccine 60+ Years old (3 of  3 - PCV) 10/18/2018   DEXA SCAN  04/28/2020   OPHTHALMOLOGY EXAM  05/28/2021   DTaP/Tdap/Td (3 - Td or Tdap) 11/03/2021   HEMOGLOBIN A1C  09/24/2022   INFLUENZA VACCINE  01/12/2023   COVID-19 Vaccine (1 - 2023-24 season) Never done    Colorectal cancer screening: Referral to GI placed declined . Pt aware the office will call re: appt.  Mammogram status: Ordered declined . Pt provided with contact info and advised to call to schedule appt.   Bone Density status: Ordered declined . Pt provided with contact info and advised to call to schedule appt.  Lung Cancer Screening: (Low Dose CT Chest recommended if Age 61-80 years, 20 pack-year currently smoking OR have quit w/in 15years.) does not qualify.   Lung Cancer Screening Referral: n/a  Additional Screening:  Hepatitis C Screening: does not qualify; Completed 09/18/2017  Vision Screening: Recommended annual ophthalmology exams for early detection of glaucoma and other disorders of the eye. Is the patient up to date with their annual eye exam?  No  Who is the provider or what is the name of the office in which the patient attends annual eye exams? None declined by care giver  If pt is not established with a provider, would they like to be referred to a provider to establish care? No .   Dental Screening: Recommended annual dental exams for proper oral hygiene  Diabetic Foot Exam: Diabetic Foot Exam: Overdue, Pt has been advised about the importance in completing this exam. Pt is scheduled for diabetic foot exam on next office visit .  Community Resource Referral / Chronic Care Management: CRR required this visit?  No   CCM required this visit?  No     Plan:     I have personally reviewed and noted the following in the patient's chart:   Medical and social history Use of alcohol, tobacco or illicit drugs  Current medications and supplements including opioid prescriptions. Patient is not currently taking opioid  prescriptions. Functional ability and status Nutritional status Physical activity Advanced directives List of other physicians Hospitalizations, surgeries, and ER visits in previous 12 months Vitals Screenings to include cognitive, depression, and falls Referrals and appointments  In addition, I have reviewed and discussed with patient certain preventive protocols, quality metrics, and best practice recommendations. A written personalized care plan for preventive services as well as general preventive health recommendations were provided to patient.     Lorrene Reid, LPN   09/19/8117   After Visit Summary: (MyChart) Due to this being a telephonic visit, the after visit summary with patients personalized plan was offered to  patient via MyChart   Nurse Notes: none

## 2023-03-07 NOTE — Patient Instructions (Signed)
Jennifer [name] , Thank you for taking time to come for your Medicare Wellness Visit. I appreciate your ongoing commitment to your health goals. Please review the following plan we discussed and let me know if I can assist you in the future.   Referrals/Orders/Follow-Ups/Clinician Recommendations: Aim for 30 minutes of exercise or brisk walking, 6-8 glasses of water, and 5 servings of fruits and vegetables each day.   This is a list of the screening recommended for you and due dates:  Health Maintenance  Topic Date Due   Zoster (Shingles) Vaccine (1 of 2) Never done   Yearly kidney health urinalysis for diabetes  07/30/2009   Complete foot exam   09/19/2018   Pneumonia Vaccine (3 of 3 - PCV) 10/18/2018   DEXA scan (bone density measurement)  04/28/2020   Eye exam for diabetics  05/28/2021   DTaP/Tdap/Td vaccine (3 - Td or Tdap) 11/03/2021   Hemoglobin A1C  09/24/2022   Flu Shot  01/12/2023   COVID-19 Vaccine (1 - 2023-24 season) Never done   Colon Cancer Screening  05/28/2024*   Mammogram  02/27/2025*   Hepatitis C Screening  09/18/2037*   Yearly kidney function blood test for diabetes  06/29/2023   Medicare Annual Wellness Visit  03/06/2024   HPV Vaccine  Aged Out  *Topic was postponed. The date shown is not the original due date.    Advanced directives: (Provided) Advance directive discussed with you today. I have provided a copy for you to complete at home and have notarized. Once this is complete, please bring a copy in to our office so we can scan it into your chart. Information on Advanced Care Planning can be found at [facility] Advance Health Care Directives Advance Health Care Directives ([email or web address]    Next Medicare Annual Wellness Visit scheduled for next year: Yes  Insert Preventive Care attachment Insert FALL PREVENTION attachment if needed

## 2023-03-09 ENCOUNTER — Ambulatory Visit: Payer: Self-pay

## 2023-03-09 ENCOUNTER — Telehealth: Payer: Self-pay

## 2023-03-09 NOTE — Patient Outreach (Signed)
Care Coordination   Follow Up Visit Note   03/09/2023 Name: NIKEYA HUNKE MRN: [id number] DOB: 1956-05-08  SHALISA PEDUZZI is a 67 y.o. year old female who sees [name], Audrie Gallus, MD for primary care. I  spoke with patients daughter, Gabryelle Helmle.  What matters to the patients health and wellness today?  Daughter states she is heading to patients house now because she received a call that patient fistula is bleeding. Daughter states patient started dialysis on  03/07/23 at the [facility] center.  Daughter states she and her sister are still waiting to be set up for in center classes regarding patients home dialysis.  Daughter denies any changes to patients overall treatment plan.  Daughter unable to complete call due to concerns regarding patients fistula.    Goals Addressed             This Visit's Progress    Patient / caregiver statement:  Management of health conditions       Interventions Today    Flowsheet Row Most Recent Value  Chronic Disease   Chronic disease during today's visit Chronic Kidney Disease/End Stage Renal Disease (ESRD)  General Interventions   General Interventions Discussed/Reviewed General Interventions Reviewed  [evaluation of current treatment plan for CKD and patients adherence to plan as established by provider.]  Education Interventions   Education Provided Provided Education  [Advised to call nephrologist for fistula concerns.]               SDOH assessments and interventions completed:  No     Care Coordination Interventions:  No, not indicated   Follow up plan: Follow up call scheduled for 04/19/23    Encounter Outcome:  Patient Visit Completed   George Ina RN,BSN,CCM [facility] Care Coordination [phone] direct line

## 2023-03-09 NOTE — Patient Outreach (Signed)
Care Coordination   03/09/2023 Name: Jennifer [name] MRN: [id number] DOB: October 02, 1955   Care Coordination Outreach Attempts:  An unsuccessful telephone outreach was attempted for a scheduled appointment today. HIPAA compliant message left with call back phone number.   Follow Up Plan:  Additional outreach attempts will be made to offer the patient care coordination information and services.   Encounter Outcome:  No Answer   Care Coordination Interventions:  No, not indicated    George Ina [facility] Care Coordination [phone] direct line

## 2023-03-20 ENCOUNTER — Other Ambulatory Visit: Payer: Self-pay | Admitting: Neurology

## 2023-03-25 ENCOUNTER — Other Ambulatory Visit: Payer: Self-pay | Admitting: Neurology

## 2023-04-14 DIAGNOSIS — K769 Liver disease, unspecified: Secondary | ICD-10-CM | POA: Insufficient documentation

## 2023-04-17 ENCOUNTER — Other Ambulatory Visit: Payer: Self-pay | Admitting: Neurology

## 2023-04-17 NOTE — Telephone Encounter (Signed)
Rx refilled per last office visit note.

## 2023-04-18 ENCOUNTER — Encounter: Payer: Self-pay | Admitting: Family Medicine

## 2023-04-19 ENCOUNTER — Ambulatory Visit: Payer: Self-pay

## 2023-04-19 NOTE — Patient Outreach (Signed)
  Care Coordination   Follow Up Visit Note   04/19/2023 Name: Jennifer [name] MRN: [id number] DOB: 05-Feb-1956  Jennifer [name] is a 67 y.o. year old female who sees [name], Audrie Gallus, MD for primary care. I  spoke with daughter, Jennifer [name]  What matters to the patients health and wellness today?  Daughter states patient's fistula issues have been addressed. Daughter states her sister is doing the home dialysis education classes with/ for patient at this time.  Daughter states provider discontinued patients BP medications.  Daughter states she feels patients dementia is getting worse.  She states patient is fussing more/ expressing anger.     Goals Addressed             This Visit's Progress    Patient / caregiver statement:  Management of health conditions       Interventions Today    Flowsheet Row Most Recent Value  Chronic Disease   Chronic disease during today's visit Chronic Kidney Disease/End Stage Renal Disease (ESRD), Other  [dementia]  General Interventions   General Interventions Discussed/Reviewed General Interventions Reviewed, Doctor Visits  [evaluation of current treatment plan for mentioned health conditions and patients adherence to plan as established by provider. Assessed for Dementia symptoms and confirmed patient had fistual concerns addressed.]  Doctor Visits Discussed/Reviewed Doctor Visits Reviewed  Education Interventions   Education Provided Provided Education  [Advised to discuss increase in dementia symptoms with neurologist.]  Pharmacy Interventions   Pharmacy Dicussed/Reviewed Pharmacy Topics Reviewed  [discussed medication adjustment.  Advised to take medication as prescribed.]               SDOH assessments and interventions completed:  No     Care Coordination Interventions:  Yes, provided   Follow up plan: Follow up call scheduled for 05/15/23    Encounter Outcome:  Patient Visit Completed   George Ina RN,BSN,CCM [facility]  Value-Based  [facility], [facility] coordinator / Case Manager Phone: [phone]

## 2023-04-19 NOTE — Patient Instructions (Signed)
Visit Information  Thank you for taking time to visit with me today. Please don't hesitate to contact me if I can be of assistance to you.   Following are the goals we discussed today:   Goals Addressed             This Visit's Progress    Patient / caregiver statement:  Management of health conditions       Interventions Today    Flowsheet Row Most Recent Value  Chronic Disease   Chronic disease during today's visit Chronic Kidney Disease/End Stage Renal Disease (ESRD), Other  [dementia]  General Interventions   General Interventions Discussed/Reviewed General Interventions Reviewed, Doctor Visits  [evaluation of current treatment plan for mentioned health conditions and patients adherence to plan as established by provider. Assessed for Dementia symptoms and confirmed patient had fistual concerns addressed.]  Doctor Visits Discussed/Reviewed Doctor Visits Reviewed  Education Interventions   Education Provided Provided Education  [Advised to discuss increase in dementia symptoms with neurologist.]  Pharmacy Interventions   Pharmacy Dicussed/Reviewed Pharmacy Topics Reviewed  [discussed medication adjustment.  Advised to take medication as prescribed.]               Our next appointment is by telephone on 05/15/23 at 10 am  Please call the care guide team at [phone] if you need to cancel or reschedule your appointment.   If you are experiencing a Mental Health or Behavioral Health Crisis or need someone to talk to, please call the Suicide and Crisis Lifeline: 988 call 1-800-273-TALK (toll free, 24 hour hotline)  Patient verbalizes understanding of instructions and care plan provided today and agrees to view in MyChart. Active MyChart status and patient understanding of how to access instructions and care plan via MyChart confirmed with patient.     George Ina RN,BSN,CCM [place]  [facility]Based [facility] Institute, [facility] coordinator / Case  Manager Phone: [phone]

## 2023-04-20 DIAGNOSIS — D509 Iron deficiency anemia, unspecified: Secondary | ICD-10-CM | POA: Insufficient documentation

## 2023-05-03 ENCOUNTER — Telehealth: Payer: Self-pay

## 2023-05-03 NOTE — Patient Outreach (Signed)
Renal coordinator attempted to call patient on today regarding Safe Start check in. Successful outreach made today with patient's daughter Jennifer [name]. Daughter shared patient is doing well and will begin HD next week. Coordinator will mail additional education materials regarding nutrition.  [name] Renal Coordinator [facility] Population Health [phone]

## 2023-05-13 ENCOUNTER — Other Ambulatory Visit: Payer: Self-pay | Admitting: Neurology

## 2023-05-15 ENCOUNTER — Ambulatory Visit: Payer: Self-pay

## 2023-05-15 NOTE — Patient Outreach (Signed)
  Care Coordination   05/15/2023 Name: ALTAMESE THROOP MRN: [id number] DOB: 10/09/1955   Care Coordination Outreach Attempts:  An unsuccessful telephone outreach was attempted for a scheduled appointment today. Call attempted to patients daughter Tiondra Heroux.  Unable to reach.  HIPAA compliant voice message left with call back phone number.   Follow Up Plan:  Additional outreach attempts will be made to offer the patient care coordination information and services.   Encounter Outcome:  No Answer   Care Coordination Interventions:  No, not indicated    George Ina RN,BSN,CCM [facility]  [facility], [facility] coordinator / Case Manager Phone: [phone]

## 2023-05-15 NOTE — Telephone Encounter (Signed)
Rx refilled.

## 2023-05-20 ENCOUNTER — Emergency Department (HOSPITAL_COMMUNITY): Payer: Medicare Other

## 2023-05-20 ENCOUNTER — Inpatient Hospital Stay (HOSPITAL_COMMUNITY): Payer: Medicare Other

## 2023-05-20 ENCOUNTER — Inpatient Hospital Stay (HOSPITAL_COMMUNITY)
Admission: EM | Admit: 2023-05-20 | Discharge: 2023-05-24 | DRG: 064 | Disposition: A | Discharge status: Home Health | Payer: Medicare Other | Attending: Neurology | Admitting: Neurology

## 2023-05-20 ENCOUNTER — Other Ambulatory Visit: Payer: Self-pay

## 2023-05-20 DIAGNOSIS — E039 Hypothyroidism, unspecified: Secondary | ICD-10-CM | POA: Diagnosis present

## 2023-05-20 DIAGNOSIS — N2581 Secondary hyperparathyroidism of renal origin: Secondary | ICD-10-CM | POA: Diagnosis present

## 2023-05-20 DIAGNOSIS — I120 Hypertensive chronic kidney disease with stage 5 chronic kidney disease or end stage renal disease: Secondary | ICD-10-CM | POA: Diagnosis present

## 2023-05-20 DIAGNOSIS — R471 Dysarthria and anarthria: Secondary | ICD-10-CM | POA: Diagnosis present

## 2023-05-20 DIAGNOSIS — Z992 Dependence on renal dialysis: Secondary | ICD-10-CM

## 2023-05-20 DIAGNOSIS — R1310 Dysphagia, unspecified: Secondary | ICD-10-CM | POA: Diagnosis present

## 2023-05-20 DIAGNOSIS — Z8249 Family history of ischemic heart disease and other diseases of the circulatory system: Secondary | ICD-10-CM | POA: Diagnosis not present

## 2023-05-20 DIAGNOSIS — I63512 Cerebral infarction due to unspecified occlusion or stenosis of left middle cerebral artery: Secondary | ICD-10-CM | POA: Diagnosis present

## 2023-05-20 DIAGNOSIS — I613 Nontraumatic intracerebral hemorrhage in brain stem: Secondary | ICD-10-CM

## 2023-05-20 DIAGNOSIS — Z87891 Personal history of nicotine dependence: Secondary | ICD-10-CM | POA: Diagnosis not present

## 2023-05-20 DIAGNOSIS — I639 Cerebral infarction, unspecified: Principal | ICD-10-CM

## 2023-05-20 DIAGNOSIS — R4701 Aphasia: Secondary | ICD-10-CM | POA: Diagnosis not present

## 2023-05-20 DIAGNOSIS — D631 Anemia in chronic kidney disease: Secondary | ICD-10-CM | POA: Diagnosis present

## 2023-05-20 DIAGNOSIS — I63531 Cerebral infarction due to unspecified occlusion or stenosis of right posterior cerebral artery: Secondary | ICD-10-CM | POA: Diagnosis not present

## 2023-05-20 DIAGNOSIS — I10 Essential (primary) hypertension: Secondary | ICD-10-CM | POA: Diagnosis not present

## 2023-05-20 DIAGNOSIS — I2510 Atherosclerotic heart disease of native coronary artery without angina pectoris: Secondary | ICD-10-CM | POA: Diagnosis present

## 2023-05-20 DIAGNOSIS — E46 Unspecified protein-calorie malnutrition: Secondary | ICD-10-CM | POA: Diagnosis present

## 2023-05-20 DIAGNOSIS — E119 Type 2 diabetes mellitus without complications: Secondary | ICD-10-CM

## 2023-05-20 DIAGNOSIS — E1122 Type 2 diabetes mellitus with diabetic chronic kidney disease: Secondary | ICD-10-CM | POA: Diagnosis present

## 2023-05-20 DIAGNOSIS — Z888 Allergy status to other drugs, medicaments and biological substances status: Secondary | ICD-10-CM

## 2023-05-20 DIAGNOSIS — E8809 Other disorders of plasma-protein metabolism, not elsewhere classified: Secondary | ICD-10-CM | POA: Diagnosis present

## 2023-05-20 DIAGNOSIS — E669 Obesity, unspecified: Secondary | ICD-10-CM | POA: Diagnosis present

## 2023-05-20 DIAGNOSIS — H02401 Unspecified ptosis of right eyelid: Secondary | ICD-10-CM | POA: Diagnosis present

## 2023-05-20 DIAGNOSIS — R29718 NIHSS score 18: Secondary | ICD-10-CM | POA: Diagnosis present

## 2023-05-20 DIAGNOSIS — T45615A Adverse effect of thrombolytic drugs, initial encounter: Secondary | ICD-10-CM | POA: Diagnosis present

## 2023-05-20 DIAGNOSIS — N186 End stage renal disease: Secondary | ICD-10-CM

## 2023-05-20 DIAGNOSIS — Z79899 Other long term (current) drug therapy: Secondary | ICD-10-CM | POA: Diagnosis not present

## 2023-05-20 DIAGNOSIS — F0390 Unspecified dementia without behavioral disturbance: Secondary | ICD-10-CM | POA: Diagnosis present

## 2023-05-20 DIAGNOSIS — G8194 Hemiplegia, unspecified affecting left nondominant side: Secondary | ICD-10-CM | POA: Diagnosis present

## 2023-05-20 DIAGNOSIS — E7800 Pure hypercholesterolemia, unspecified: Secondary | ICD-10-CM | POA: Diagnosis present

## 2023-05-20 DIAGNOSIS — Z823 Family history of stroke: Secondary | ICD-10-CM

## 2023-05-20 DIAGNOSIS — Z955 Presence of coronary angioplasty implant and graft: Secondary | ICD-10-CM

## 2023-05-20 DIAGNOSIS — M79661 Pain in right lower leg: Secondary | ICD-10-CM | POA: Diagnosis not present

## 2023-05-20 DIAGNOSIS — Z79890 Hormone replacement therapy (postmenopausal): Secondary | ICD-10-CM

## 2023-05-20 DIAGNOSIS — I6389 Other cerebral infarction: Secondary | ICD-10-CM | POA: Diagnosis not present

## 2023-05-20 DIAGNOSIS — R29810 Facial weakness: Secondary | ICD-10-CM | POA: Diagnosis present

## 2023-05-20 DIAGNOSIS — Z6832 Body mass index (BMI) 32.0-32.9, adult: Secondary | ICD-10-CM

## 2023-05-20 DIAGNOSIS — Z90710 Acquired absence of both cervix and uterus: Secondary | ICD-10-CM

## 2023-05-20 DIAGNOSIS — Z7982 Long term (current) use of aspirin: Secondary | ICD-10-CM

## 2023-05-20 DIAGNOSIS — T82818A Embolism of vascular prosthetic devices, implants and grafts, initial encounter: Secondary | ICD-10-CM | POA: Diagnosis not present

## 2023-05-20 DIAGNOSIS — I953 Hypotension of hemodialysis: Secondary | ICD-10-CM | POA: Diagnosis not present

## 2023-05-20 DIAGNOSIS — Z96651 Presence of right artificial knee joint: Secondary | ICD-10-CM | POA: Diagnosis present

## 2023-05-20 LAB — COMPREHENSIVE METABOLIC PANEL
ALT: 19 U/L (ref 0–44)
AST: 21 U/L (ref 15–41)
Albumin: 2.9 g/dL — ABNORMAL LOW (ref 3.5–5.0)
Alkaline Phosphatase: 53 U/L (ref 38–126)
Anion gap: 16 — ABNORMAL HIGH (ref 5–15)
BUN: 43 mg/dL — ABNORMAL HIGH (ref 8–23)
CO2: 24 mmol/L (ref 22–32)
Calcium: 8.8 mg/dL — ABNORMAL LOW (ref 8.9–10.3)
Chloride: 96 mmol/L — ABNORMAL LOW (ref 98–111)
Creatinine, Ser: 4.03 mg/dL — ABNORMAL HIGH (ref 0.44–1.00)
GFR, Estimated: 12 mL/min — ABNORMAL LOW (ref >60)
Glucose, Bld: 234 mg/dL — ABNORMAL HIGH (ref 70–99)
Potassium: 3.5 mmol/L (ref 3.5–5.1)
Sodium: 136 mmol/L (ref 135–145)
Total Bilirubin: 0.6 mg/dL (ref <1.2)
Total Protein: 6.1 g/dL — ABNORMAL LOW (ref 6.5–8.1)

## 2023-05-20 LAB — DIFFERENTIAL
Abs Immature Granulocytes: 0.03 10*3/uL (ref 0.00–0.07)
Basophils Absolute: 0.1 10*3/uL (ref 0.0–0.1)
Basophils Relative: 1 %
Eosinophils Absolute: 0.1 10*3/uL (ref 0.0–0.5)
Eosinophils Relative: 2 %
Immature Granulocytes: 1 %
Lymphocytes Relative: 18 %
Lymphs Abs: 1.1 10*3/uL (ref 0.7–4.0)
Monocytes Absolute: 0.4 10*3/uL (ref 0.1–1.0)
Monocytes Relative: 6 %
Neutro Abs: 4.2 10*3/uL (ref 1.7–7.7)
Neutrophils Relative %: 72 %

## 2023-05-20 LAB — CBC
HCT: 36.1 % (ref 36.0–46.0)
Hemoglobin: 11.1 g/dL — ABNORMAL LOW (ref 12.0–15.0)
MCH: 28.2 pg (ref 26.0–34.0)
MCHC: 30.7 g/dL (ref 30.0–36.0)
MCV: 91.9 fL (ref 80.0–100.0)
Platelets: 186 10*3/uL (ref 150–400)
RBC: 3.93 MIL/uL (ref 3.87–5.11)
RDW: 14.9 % (ref 11.5–15.5)
WBC: 5.8 10*3/uL (ref 4.0–10.5)
nRBC: 0 % (ref 0.0–0.2)

## 2023-05-20 LAB — PROTIME-INR
INR: 1.1 (ref 0.8–1.2)
Prothrombin Time: 14.3 s (ref 11.4–15.2)

## 2023-05-20 LAB — HIV ANTIBODY (ROUTINE TESTING W REFLEX): HIV Screen 4th Generation wRfx: NONREACTIVE

## 2023-05-20 LAB — GLUCOSE, CAPILLARY
Glucose-Capillary: 203 mg/dL — ABNORMAL HIGH (ref 70–99)
Glucose-Capillary: 151 mg/dL — ABNORMAL HIGH (ref 70–99)

## 2023-05-20 LAB — TYPE AND SCREEN
ABO/RH(D): A POS
Antibody Screen: NEGATIVE

## 2023-05-20 LAB — CBG MONITORING, ED: Glucose-Capillary: 183 mg/dL — ABNORMAL HIGH (ref 70–99)

## 2023-05-20 LAB — HEMOGLOBIN A1C
Hgb A1c MFr Bld: 6.2 % — ABNORMAL HIGH (ref 4.8–5.6)
Mean Plasma Glucose: 131.24 mg/dL

## 2023-05-20 LAB — ETHANOL: Alcohol, Ethyl (B): <10 mg/dL (ref <10)

## 2023-05-20 LAB — MRSA NEXT GEN BY PCR, NASAL: MRSA by PCR Next Gen: NOT DETECTED

## 2023-05-20 LAB — APTT: aPTT: 34 s (ref 24–36)

## 2023-05-20 MED ORDER — SODIUM CHLORIDE 0.9% IV SOLUTION: Freq: Once | INTRAVENOUS | Status: AC

## 2023-05-20 MED ORDER — CHLORHEXIDINE GLUCONATE CLOTH 2 % EX PADS: 6.0000 | MEDICATED_PAD | Freq: Every day | CUTANEOUS | Status: DC

## 2023-05-20 MED ORDER — PANTOPRAZOLE SODIUM 40 MG IV SOLR
40.0000 mg | Freq: Every day | INTRAVENOUS | Status: DC
  Administered 2023-05-20 – 2023-05-23 (×4): 40 mg via INTRAVENOUS
  Filled 2023-05-20 (×4): qty 10

## 2023-05-20 MED ORDER — SENNOSIDES-DOCUSATE SODIUM 8.6-50 MG PO TABS
1.0000 | ORAL_TABLET | Freq: Two times a day (BID) | ORAL | Status: DC
  Administered 2023-05-22 – 2023-05-24 (×4): 1 via ORAL
  Filled 2023-05-20 (×4): qty 1

## 2023-05-20 MED ORDER — CLEVIDIPINE BUTYRATE 0.5 MG/ML IV EMUL
0.0000 mg/h | INTRAVENOUS | Status: DC
  Administered 2023-05-20: 2 mg/h via INTRAVENOUS

## 2023-05-20 MED ORDER — IOHEXOL 350 MG/ML SOLN
75.0000 mL | Freq: Once | INTRAVENOUS | Status: AC | PRN
  Administered 2023-05-20: 75 mL via INTRAVENOUS

## 2023-05-20 MED ORDER — ONDANSETRON HCL 4 MG/2ML IJ SOLN
4.0000 mg | Freq: Four times a day (QID) | INTRAMUSCULAR | Status: DC | PRN
  Administered 2023-05-20 – 2023-05-22 (×5): 4 mg via INTRAVENOUS
  Filled 2023-05-20 (×4): qty 2

## 2023-05-20 MED ORDER — HYDRALAZINE HCL 20 MG/ML IJ SOLN
10.0000 mg | INTRAMUSCULAR | Status: DC | PRN
  Administered 2023-05-20: 20 mg via INTRAVENOUS
  Administered 2023-05-21: 10 mg via INTRAVENOUS
  Filled 2023-05-20 (×3): qty 1

## 2023-05-20 MED ORDER — SODIUM CHLORIDE 0.9 % IV SOLN: Freq: Once | INTRAVENOUS | Status: DC

## 2023-05-20 MED ORDER — ONDANSETRON HCL 4 MG/2ML IJ SOLN
INTRAMUSCULAR | Status: AC
  Filled 2023-05-20: qty 2

## 2023-05-20 MED ORDER — ORAL CARE MOUTH RINSE: 15.0000 mL | OROMUCOSAL | Status: DC | PRN

## 2023-05-20 MED ORDER — ACETAMINOPHEN 650 MG RE SUPP
650.0000 mg | RECTAL | Status: DC | PRN
  Administered 2023-05-22: 650 mg via RECTAL
  Filled 2023-05-20: qty 1

## 2023-05-20 MED ORDER — INSULIN ASPART 100 UNIT/ML IJ SOLN
0.0000 [IU] | INTRAMUSCULAR | Status: DC
  Administered 2023-05-20: 2 [IU] via SUBCUTANEOUS
  Administered 2023-05-20: 1 [IU] via SUBCUTANEOUS
  Administered 2023-05-22: 2 [IU] via SUBCUTANEOUS
  Administered 2023-05-22 – 2023-05-23 (×3): 1 [IU] via SUBCUTANEOUS
  Administered 2023-05-23: 2 [IU] via SUBCUTANEOUS

## 2023-05-20 MED ORDER — TENECTEPLASE FOR STROKE
0.2500 mg/kg | PACK | Freq: Once | INTRAVENOUS | Status: AC
  Administered 2023-05-20: 23 mg via INTRAVENOUS

## 2023-05-20 MED ORDER — ACETAMINOPHEN 325 MG PO TABS
650.0000 mg | ORAL_TABLET | ORAL | Status: DC | PRN
  Administered 2023-05-23: 650 mg via ORAL
  Filled 2023-05-20: qty 2

## 2023-05-20 MED ORDER — ACETAMINOPHEN 160 MG/5ML PO SOLN: 650.0000 mg | ORAL | Status: DC | PRN

## 2023-05-20 MED ORDER — TRANEXAMIC ACID-NACL 1000-0.7 MG/100ML-% IV SOLN
1000.0000 mg | Freq: Once | INTRAVENOUS | Status: AC
  Administered 2023-05-20: 1000 mg via INTRAVENOUS

## 2023-05-20 MED ORDER — STROKE: EARLY STAGES OF RECOVERY BOOK
Freq: Once | Status: AC
  Filled 2023-05-20: qty 1

## 2023-05-20 MED ORDER — CHLORHEXIDINE GLUCONATE CLOTH 2 % EX PADS
6.0000 | MEDICATED_PAD | Freq: Every day | CUTANEOUS | Status: DC
  Administered 2023-05-20 – 2023-05-21 (×2): 6 via TOPICAL

## 2023-05-20 MED ORDER — CLEVIDIPINE BUTYRATE 0.5 MG/ML IV EMUL
INTRAVENOUS | Status: AC
  Filled 2023-05-20: qty 100

## 2023-05-20 NOTE — Progress Notes (Signed)
PT Cancellation Note  Patient Details Name: Jennifer [name] MRN: [id number] DOB: 04-21-1956   Cancelled Treatment:    Reason Eval/Treat Not Completed: (P) Active bedrest order x24 hours starting 12/7 at 16:11. Will plan to follow-up once bedrest orders are lifted and as appropriate.   Virgil Benedict, PT, DPT Acute Rehabilitation Services  Office: [phone]    Jennifer [name] 05/20/2023, 5:05 PM

## 2023-05-20 NOTE — Consult Note (Signed)
[facility] Renal Consultation Note    Indication for Consultation:  Management of ESRD/hemodialysis; anemia, hypertension/volume and secondary hyperparathyroidism  JXB:JYNWG, Audrie Gallus, MD  HPI: Jennifer [name] is a 67 y.o. 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 Spoke to patient's daughter via phone. Patient recently completed home hemodialysis training and started HHD the week of Thanksgiving. Patient's primary Nephrologist is Dr. Ronalee Belts at [facility].  Reviewed recent medical notes. Noted patient was undergoing her routine home HD treatment today when around 11:30am, her daughter noted patient was non-verbal with worsening left-sided weakness. She presented to [facility] ED and a Code stroke was activated. She received Heparin 2000 units pre-HD. TNK was given around 12:52pm this afternoon. Patient was then transferred to [facility] for further management. We were consulted for ongoing management of her dialysis.   Seen and examined patient quickly at beside as nursing was preparing her to go to a STAT repeat head ct. Per patient's daughter, her EDW was recently lowered to 88kg last week. Patient only received about an hour of treatment and was removed today. She is not grossly overloaded on exam. Renal labs including Hgb are currently stable. Repeat head ct however showed an acute hemorrhage. There is no urgent need for dialysis today. Likely next HD will be Monday 12/9. Heparin will be held during treatments for now.   Past Medical History:  Diagnosis Date   CAD (coronary artery disease)    cath 5/23 100% dist RCA lesion treated with 2 overlapping Integrity Resolute DES ( 2.25x32mm, 2.25x56mm), 60% mid RCA treated medically, 99% OM2 not amenable to PCI, 70% D1 lesion, EF normal    Hypercholesteremia    Hypertension    Hypothyroidism    Kidney disease    Renal disorder    Stroke (HCC) 10/2018   Thyroid disease    Type 2 diabetes mellitus (HCC) 10/08/2018   Past Surgical History:  Procedure Laterality Date   ABDOMINAL HYSTERECTOMY     AV FISTULA PLACEMENT Right 06/28/2022   Procedure: RIGHT ARM ARTERIOVENOUS (AV) FISTULA CREATION;  Surgeon: Larina Earthly, MD;  Location: [facility];  Service: Vascular;  Laterality: Right;   CARDIAC CATHETERIZATION N/A 11/03/2015   Procedure: Left Heart Cath and Coronary Angiography;  Surgeon: Marykay Lex, MD;  Location: [facility] INVASIVE CV LAB;  Service: Cardiovascular;  Laterality: N/A;   CARDIAC CATHETERIZATION N/A 11/03/2015   Procedure: Coronary Stent Intervention;  Surgeon: Marykay Lex, MD;  Location: [facility] INVASIVE CV LAB;  Service: Cardiovascular;  Laterality: N/A;   CARPAL TUNNEL RELEASE     CORONARY ANGIOPLASTY     1 stent   FISTULA SUPERFICIALIZATION Right 08/16/2022   Procedure: RIGHT ARTERIOVENOUS FISTULA SUPERFICIALIZATION;  Surgeon: Larina Earthly, MD;  Location: [facility];  Service: Vascular;  Laterality: Right;   TOTAL KNEE ARTHROPLASTY Right 04/02/2019   Procedure: TOTAL KNEE ARTHROPLASTY;  Surgeon: Vickki Hearing, MD;  Location: [facility];  Service: Orthopedics;  Laterality: Right;   Family History  Problem Relation Age of Onset   Heart disease Mother    Stroke Sister    Heart attack Brother    Breast cancer Neg Hx    Social History:  reports that she quit smoking about 2 years ago. Her smoking use included cigarettes. She has never used smokeless tobacco. She reports that she does not drink  alcohol and does not use drugs. Allergies  Allergen Reactions   Metformin Hcl Other (See Comments)    Unknown reaction    Prior to Admission medications   Medication Sig Start Date End Date Taking? Authorizing Provider  folic acid (FOLVITE) 1 MG tablet Take 1 tablet by mouth once daily 05/15/23   Micki Riley, MD   amLODipine (NORVASC) 10 MG tablet Take 1 tablet by mouth once daily Patient not taking: Reported on 04/19/2023 05/11/22   Mallipeddi, Vishnu P, MD  aspirin EC 81 MG tablet Take 1 tablet (81 mg total) by mouth 2 (two) times daily. Hold aspirin while you are on apixaban/Eliquis 04/06/19   Emokpae, Courage, MD  atorvastatin (LIPITOR) 80 MG tablet TAKE 1 TABLET BY MOUTH ONCE DAILY 6 IN THE EVENING 10/20/22   Mallipeddi, Vishnu P, MD  BD PEN NEEDLE NANO 2ND GEN 32G X 4 MM MISC Inject into the skin 3 (three) times daily. 07/22/21   [provider]  buPROPion (WELLBUTRIN SR) 150 MG 12 hr tablet Take 1 tablet by mouth twice daily 10/19/22   [name], Audrie Gallus, MD  carvedilol (COREG) 12.5 MG tablet Take 1 tablet (12.5 mg total) by mouth 2 (two) times daily with a meal. 10/20/22   Mallipeddi, Vishnu P, MD  cholecalciferol (VITAMIN D3) 25 MCG (1000 UNIT) tablet Take 1,000 Units by mouth in the morning and at bedtime.    [provider]  DULoxetine (CYMBALTA) 60 MG capsule Take 1 capsule by mouth once daily 10/19/22   [name], Audrie Gallus, MD  ferrous sulfate 325 (65 FE) MG tablet Take 325 mg by mouth 2 (two) times daily with a meal.    [provider]  furosemide (LASIX) 80 MG tablet Take 80 mg by mouth 2 (two) times daily. 10/27/22   [provider]  insulin lispro protamine-lispro (HUMALOG 75/25 MIX) (75-25) 100 UNIT/ML SUSP injection Inject 10-25 Units into the skin See admin instructions. Inject 25 units with breakfast, 10 units at lunch, 25 units at dinner    [provider]  levothyroxine (SYNTHROID) 175 MCG tablet 1 tablet in the morning on an empty stomach Orally Once a day    [provider]  memantine (NAMENDA) 10 MG tablet Take 1 tablet (10 mg total) by mouth 2 (two) times daily. 02/22/23   Micki Riley, MD  nitroGLYCERIN (NITROSTAT) 0.4 MG SL tablet Place 0.4 mg under the tongue every 5 (five) minutes as needed for chest pain.    [provider]  potassium  chloride SA (KLOR-CON M) 20 MEQ tablet Take 20 mEq by mouth daily.    [provider]  sodium bicarbonate 650 MG tablet Take 1 tablet (650 mg total) by mouth 2 (two) times daily. 03/25/22   Almon Hercules, MD   Current Facility-Administered Medications  Medication Dose Route Frequency Provider Last Rate Last Admin   [START ON 05/21/2023]  stroke: early stages of recovery book   Does not apply Once Elmer Picker, NP       0.9 %  sodium chloride infusion   Intravenous Once Bhagat, Srishti L, MD       acetaminophen (TYLENOL) tablet 650 mg  650 mg Oral Q4H PRN Elmer Picker, NP       Or   acetaminophen (TYLENOL) 160 MG/5ML solution 650 mg  650 mg Per Tube Q4H PRN Elmer Picker, NP       Or   acetaminophen (TYLENOL) suppository 650 mg  650 mg Rectal Q4H PRN  Elmer Picker, NP       Chlorhexidine Gluconate Cloth 2 % PADS 6 each  6 each Topical Q0600 Elmer Picker, NP       clevidipine (CLEVIPREX) 0.5 MG/ML infusion            clevidipine (CLEVIPREX) infusion 0.5 mg/mL  0-21 mg/hr Intravenous Continuous Elmer Picker, NP 4 mL/hr at 05/20/23 1608 2 mg/hr at 05/20/23 1608   hydrALAZINE (APRESOLINE) injection 10-20 mg  10-20 mg Intravenous Q4H PRN Oretha Milch, MD       ondansetron [facility]) 4 MG/2ML injection            ondansetron (ZOFRAN) injection 4 mg  4 mg Intravenous Q6H PRN Elmer Picker, NP   4 mg at 05/20/23 1531   Oral care mouth rinse  15 mL Mouth Rinse PRN Elmer Picker, NP       pantoprazole (PROTONIX) injection 40 mg  40 mg Intravenous QHS Elmer Picker, NP       senna-docusate (Senokot-S) tablet 1 tablet  1 tablet Oral BID Elmer Picker, NP       Labs: Basic Metabolic Panel: Recent Labs  Lab 05/20/23 1222  NA 136  K 3.5  CL 96*  CO2 24  GLUCOSE 234*  BUN 43*  CREATININE 4.03*  CALCIUM 8.8*   Liver Function Tests: Recent Labs  Lab 05/20/23 1222  AST 21  ALT 19  ALKPHOS 53  BILITOT 0.6  PROT 6.1*  ALBUMIN 2.9*   No results for input(s): "LIPASE", "AMYLASE" in  the last 168 hours. No results for input(s): "AMMONIA" in the last 168 hours. CBC: Recent Labs  Lab 05/20/23 1222  WBC 5.8  NEUTROABS 4.2  HGB 11.1*  HCT 36.1  MCV 91.9  PLT 186   Cardiac Enzymes: No results for input(s): "CKTOTAL", "CKMB", "CKMBINDEX", "TROPONINI" in the last 168 hours. CBG: Recent Labs  Lab 05/20/23 1216  GLUCAP 183*   Iron Studies: No results for input(s): "IRON", "TIBC", "TRANSFERRIN", "FERRITIN" in the last 72 hours. Studies/Results: CT HEAD WO CONTRAST ( )  Result Date: 05/20/2023 CLINICAL DATA:  Neuro deficit, acute, stroke suspected EXAM: CT HEAD WITHOUT CONTRAST TECHNIQUE: Contiguous axial images were obtained from the base of the skull through the vertex without intravenous contrast. RADIATION DOSE REDUCTION: This exam was performed according to the departmental dose-optimization program which includes automated exposure control, adjustment of the mA and/or kV according to patient size and/or use of iterative reconstruction technique. COMPARISON:  Same day CT head FINDINGS: Brain: There is a 7 x 8 mm site acute hemorrhage in the right dorsal pons/brachium pontis (series 2, image 8). Sequela of mild chronic microvascular ischemic change. Chronic infarcts in the right basal ganglia and left cerebellum. No significant mass effect. No mass lesion. Vascular: No hyperdense vessel or unexpected calcification. Skull: Normal. Negative for fracture or focal lesion. Sinuses/Orbits: No middle ear or mastoid effusion. Paranasal sinuses are clear. Bilateral lens. Orbits are otherwise unremarkable. Other: None. IMPRESSION: Acute hemorrhage in the right dorsal pons/brachium pontis measuring 7 x 8 mm. No significant mass effect. Findings were paged to Dr. Selina Cooley on 05/20/23 at 4:21 PM. Electronically Signed   By: Lorenza Cambridge M.D.   On: 05/20/2023 16:21   CT ANGIO HEAD NECK W WO CM (CODE STROKE)  Result Date: 05/20/2023 CLINICAL DATA:  67 year old female code stroke  presentation with left side weakness, facial droop, aphasia. EXAM: CT ANGIOGRAPHY HEAD AND NECK TECHNIQUE: Multidetector CT imaging of the head and neck was performed using the standard  protocol during bolus administration of intravenous contrast. Multiplanar CT image reconstructions and MIPs were obtained to evaluate the vascular anatomy. Carotid stenosis measurements (when applicable) are obtained utilizing NASCET criteria, using the distal internal carotid diameter as the denominator. RADIATION DOSE REDUCTION: This exam was performed according to the departmental dose-optimization program which includes automated exposure control, adjustment of the mA and/or kV according to patient size and/or use of iterative reconstruction technique. CONTRAST:  75mL OMNIPAQUE IOHEXOL 350 MG/ML SOLN COMPARISON:  Plain head CT 1223 hours today. Prior CTA head and neck 10/08/2018. FINDINGS: CTA NECK Skeleton: Largely absent dentition. Mild for age cervical spine degeneration. No acute osseous abnormality identified. Upper chest: Negative. Other neck: Small volume retained secretions in the hypopharynx. Otherwise negative. Aortic arch: 3 vessel arch. Chronic Calcified aortic atherosclerosis. Right carotid system: Brachiocephalic artery plaque without stenosis. Negative right CCA. Mild calcified plaque at the right carotid bifurcation more affecting the ECA. No stenosis. Left carotid system: Mild left CCA origin calcified plaque without stenosis. Mild calcified plaque at the left carotid bifurcation. Mild tortuosity. No stenosis. Vertebral arteries: Mild right subclavian origin calcified plaque without stenosis. Minimal plaque at the right vertebral artery origin without stenosis. Tortuous right V1 segment. Right vertebral patent to the skull base without stenosis. Proximal left subclavian artery soft and calcified plaque without stenosis. Left vertebral artery origin remains normal. Tortuous left V1 segment. Codominant left  vertebral artery is mildly tortuous to the skull base with no plaque or stenosis. CTA HEAD Posterior circulation: Distal vertebral arteries, vertebrobasilar junction and PICA origins appear patent and normal. No basilar artery stenosis. Patent AICA, SCA, PCA origins. Bilateral PCA branches remain patent. Left P2 segment moderate irregularity and stenosis on series 12, image 22 appears increased. Chronic right P2 segment irregularity and stenosis is mild to moderate and stable. Anterior circulation: Both ICA siphons are patent but heavily calcified. On the left moderate cavernous and anterior genu stenosis appears progressed since 2020. On the right moderate to severe cavernous segment (series 6, image 246) and anterior genu stenosis also appears progressed. Patent carotid termini. Normal MCA and ACA origins. Normal anterior communicating artery. Small median artery of the corpus callosum. Bilateral ACA branches are stable and within normal limits. Left MCA M1 segment and bifurcation are patent without stenosis. Right MCA M1 segment and bifurcation are patent without stenosis. Bilateral MCA branches appear stable since 2020. Mild if any branch irregularity. Venous sinuses: Patent. Anatomic variants: None significant. Review of the MIP images confirms the above findings IMPRESSION: 1. Negative for large vessel occlusion. Preliminary report of this finding communicated to Dr. Bing Neighbors at 1247 hours on 05/20/2023 by text page via the [facility] messaging system. 2. Positive for chronic ICA siphon calcified plaque where stenosis appears progressed from a 2020 CTA, up to Moderate on the Left and Severe on the Right. Moderate Left PCA P2 segment stenosis also appears increased. Mild for age extracranial atherosclerosis without stenosis. 3.  Aortic Atherosclerosis (ICD10-I70.0). Electronically Signed   By: Odessa Fleming M.D.   On: 05/20/2023 12:55   CT HEAD CODE STROKE WO CONTRAST  Result Date: 05/20/2023 CLINICAL DATA:  Code  stroke.  Neuro deficit, acute, stroke suspected EXAM: CT HEAD WITHOUT CONTRAST TECHNIQUE: Contiguous axial images were obtained from the base of the skull through the vertex without intravenous contrast. RADIATION DOSE REDUCTION: This exam was performed according to the departmental dose-optimization program which includes automated exposure control, adjustment of the mA and/or kV according to patient size and/or use of iterative  reconstruction technique. COMPARISON:  Brain MR 07/02/22 FINDINGS: Brain: No hemorrhage. No hydrocephalus. No extra-axial fluid collection. There is sequela of severe chronic microvascular ischemic change. Chronic infarcts in the right basal ganglia and left cerebellum. Vascular: No hyperdense vessel or unexpected calcification. Skull: Normal. Negative for fracture or focal lesion. Sinuses/Orbits: No middle ear or mastoid effusion. Paranasal sinuses are clear. Bilateral lens replacement. Orbits are otherwise unremarkable. Other: None. ASPECTS ([place] Stroke Program Early CT Score): 10 IMPRESSION: 1. No hemorrhage or CT evidence of an acute cortical infarct. Aspects is 10. 2. Sequela of severe chronic microvascular ischemic change. Chronic infarcts in the right basal ganglia and left cerebellum. Findings were discussed with Dr. Deretha Emory on 05/20/23 at 12:38 PM Electronically Signed   By: Lorenza Cambridge M.D.   On: 05/20/2023 12:39    ROS: All others negative except those listed in HPI.  Physical Exam: Vitals:   05/20/23 1515 05/20/23 1530 05/20/23 1545 05/20/23 1600  BP: (!) 144/76 (!) 155/103 (!) 161/143 (!) 164/54  Pulse: 70 67 65 67  Resp: 14 19 13 13   Temp:      TempSrc:      SpO2: 95% 100% 96% 100%  Weight:         General: Older female, NAD Lungs: Clear anteriorly. No wheeze, rales or rhonchi. Breathing is unlabored. Heart: RRR. No murmur, rubs or gallops.  Abdomen: Soft and non-tender  Lower extremities: No LE edema Neuro: Non-verbal Dialysis Access: R AVF.  De-accessed by HD RN and Nephrologist after head ct completed. Noted some swelling and bruising-ace wraps applied-monitor  Dialysis Orders:  [facility] 4x weekly Sat/Sun/Tues/Wed 3hrs 2K BFR 400 EDW 88kg  Last Labs: Hgb 11.1, K 3.5, Ca 8.8, Alb 2.9  Assessment/Plan: Acute CVA - Neurology following, on neuro ICU. MRI brain and TTE ordered ESRD -  on HHD. No urgent need for dialysis today. Likely next HD will be on Monday 12/9. No heparin during treatments. Hypertension/volume  - Current BP acceptable not grossly overloaded on exam Anemia of CKD - Hgb 11.1. No Fe/ESA indicated at this time Secondary Hyperparathyroidism - Ca ok. Check phos in AM Nutrition - NPO. Changed to renal diet with fluid restrictions when medically stable.  Salome Holmes, NP [facility] 05/20/2023, 4:29 PM

## 2023-05-20 NOTE — Procedures (Signed)
Dialysis note  Patient arrived to the hospital with her dialysis needles in her fistula. Daughter present in the room and stated that she began to show signs of illness during her home HD.  The needles were left in place during the emergency response.  Dr Terrilee Croak, MD removed the arterial needle.  This writer removed the venous needle.  Patient fistula was swollen in the upper portion.  There was bruising present.  The patient made no indication of pain in the movement of the arm or the holding of the needle sites.  Three ace wraps were applied to the upper arm per vascular order.

## 2023-05-20 NOTE — H&P (Addendum)
NEUROLOGY H&P NOTE   Date of service: May 20, 2023 Patient Name: Jennifer [name] MRN:  [id number] DOB:  07-17-1955 Chief Complaint: "Code Stroke"  History of Present Illness  Jennifer [name] is a 67 y.o. female  has a past medical history of CAD (coronary artery disease), Hypercholesteremia, Hypertension, Hypothyroidism, Kidney disease, Renal disorder, Stroke (HCC) (10/2018), Thyroid disease, and Type 2 diabetes mellitus (HCC) (10/08/2018). who presents with acute onset complete expressive aphasia and worsening left-sided weakness s/p TNK at [facility], transferred to [facility] for further care.   Original consult by Dr. Selina Cooley: This morning she woke up and was at baseline. She had mild left arm weakness secondary to prior stroke, ambulates for very short distances on her own but typically with a wheelchair, has dementia but is typically oriented to self and family members and conversational. This morning patient had started dialysis at home with her daughter who is a Engineer, civil (consulting). At approximately 11 AM she became acutely nonverbal and stopped moving her left upper extremity. She was unable to walk. On my examination her NIH stroke scale was 16; she remained aphasic but was moving both upper extremities equally. She had no movement against gravity in either lower extremity. Head CT showed no acute process and was interpreted by Dr. Selina Cooley prior to administration of TNK. Risks, benefits, and TNK were discussed with daughter [name]. Patient received 2000 units of heparin at 10:30 AM for dialysis. Given her disabling deficit but she is now nonverbal, platelets, PTT, and INR were checked and were normal therefore TNK was offered. Daughter [name] gave informed consent to proceed. CTA showed no LVO.   On initial report it was thought that the TNK was given through the AV fistula, however it was later confirmed by family that it was given through the IV and the fistula infiltrated at home. At 1530 it was noted that the  facial droop was worsening and patient became nauseous and started vomiting. IV zofran administered and stat CT ordered. CT shows a hemorrhage   She was also found to have a rapidly increasing fistula site hematoma on arrival to [facility]  Last known well: 1100 Modified rankin score: 3-4 tNKASE: Yes given at [facility]  Risks, benefits, alternatives of IV thrombolysis were discussed and family/patient agreed to proceed. CT imaging was reviewed personally prior to IV thrombolysis administration with no evidence of bleed. Thrombectomy: No, no LVO  NIH on Arrival to [facility] 1a Level of Conscious.: 0 1b LOC Questions: 2 1c LOC Commands: 0 2 Best Gaze: 0 3 Visual: 0 4 Facial Palsy: 1 5a Motor Arm - left: 0 5b Motor Arm - Right: 0 6a Motor Leg - Left: 0 6b Motor Leg - Right: 2 7 Limb Ataxia: 0 8 Sensory: 0 9 Best Language: 3 10 Dysarthria: 2 11 Extinct. and Inatten.: 0 TOTAL: 10  ROS   Unable to obtain review of system secondary to expressive aphasia  Past History   Past Medical History:  Diagnosis Date   CAD (coronary artery disease)    cath 5/23 100% dist RCA lesion treated with 2 overlapping Integrity Resolute DES ( 2.25x41mm, 2.25x49mm), 60% mid RCA treated medically, 99% OM2 not amenable to PCI, 70% D1 lesion, EF normal   Hypercholesteremia    Hypertension    Hypothyroidism    Kidney disease    Renal disorder    Stroke (HCC) 10/2018   Thyroid disease    Type 2 diabetes mellitus (HCC) 10/08/2018   Past Surgical  History:  Procedure Laterality Date   ABDOMINAL HYSTERECTOMY     AV FISTULA PLACEMENT Right 06/28/2022   Procedure: RIGHT ARM ARTERIOVENOUS (AV) FISTULA CREATION;  Surgeon: Larina Earthly, MD;  Location: [facility];  Service: Vascular;  Laterality: Right;   CARDIAC CATHETERIZATION N/A 11/03/2015   Procedure: Left Heart Cath and Coronary Angiography;  Surgeon: Marykay Lex, MD;  Location: [facility] INVASIVE CV LAB;  Service: Cardiovascular;  Laterality:  N/A;   CARDIAC CATHETERIZATION N/A 11/03/2015   Procedure: Coronary Stent Intervention;  Surgeon: Marykay Lex, MD;  Location: [facility] INVASIVE CV LAB;  Service: Cardiovascular;  Laterality: N/A;   CARPAL TUNNEL RELEASE     CORONARY ANGIOPLASTY     1 stent   FISTULA SUPERFICIALIZATION Right 08/16/2022   Procedure: RIGHT ARTERIOVENOUS FISTULA SUPERFICIALIZATION;  Surgeon: Larina Earthly, MD;  Location: [facility];  Service: Vascular;  Laterality: Right;   TOTAL KNEE ARTHROPLASTY Right 04/02/2019   Procedure: TOTAL KNEE ARTHROPLASTY;  Surgeon: Vickki Hearing, MD;  Location: [facility];  Service: Orthopedics;  Laterality: Right;   Family History  Problem Relation Age of Onset   Heart disease Mother    Stroke Sister    Heart attack Brother    Breast cancer Neg Hx    Social History   Socioeconomic History   Marital status: Married    Spouse name: Not on file   Number of children: Not on file   Years of education: Not on file   Highest education level: Not on file  Occupational History   Not on file  Tobacco Use   Smoking status: Former    Current packs/day: 0.00    Types: Cigarettes    Quit date: 05/13/2021    Years since quitting: 2.0   Smokeless tobacco: Never  Vaping Use   Vaping status: Never Used  Substance and Sexual Activity   Alcohol use: No    Alcohol/week: 0.0 standard drinks of alcohol   Drug use: No   Sexual activity: Not on file  Other Topics Concern   Not on file  Social History Narrative   Not on file   Social Determinants of Health   Financial Resource Strain: Low Risk  (03/07/2023)   Overall Financial Resource Strain (CARDIA)    Difficulty of Paying Living Expenses: Not hard at all  Food Insecurity: No Food Insecurity (03/07/2023)   Hunger Vital Sign    Worried About Running Out of Food in the Last Year: Never true    Ran Out of Food in the Last Year: Never true  Transportation Needs: No Transportation Needs (03/07/2023)   PRAPARE - Scientist, research (physical sciences) (Medical): No    Lack of Transportation (Non-Medical): No  Physical Activity: Inactive (03/07/2023)   Exercise Vital Sign    Days of Exercise per Week: 0 days    Minutes of Exercise per Session: 0 min  Stress: No Stress Concern Present (03/07/2023)   [employer] of Occupational Health - Occupational Stress Questionnaire    Feeling of Stress : Not at all  Social Connections: Socially Integrated (03/07/2023)   Social Connection and Isolation Panel [NHANES]    Frequency of Communication with Friends and Family: More than three times a week    Frequency of Social Gatherings with Friends and Family: More than three times a week    Attends Religious Services: More than 4 times per year    Active Member of [employer] or Organizations: Yes    Attends Club or  Organization Meetings: More than 4 times per year    Marital Status: Married   Allergies  Allergen Reactions   Metformin Hcl Other (See Comments)    Unknown reaction     Medications   Medications Prior to Admission  Medication Sig Dispense Refill Last Dose   folic acid (FOLVITE) 1 MG tablet Take 1 tablet by mouth once daily 30 tablet 0    amLODipine (NORVASC) 10 MG tablet Take 1 tablet by mouth once daily (Patient not taking: Reported on 04/19/2023) 90 tablet 3    aspirin EC 81 MG tablet Take 1 tablet (81 mg total) by mouth 2 (two) times daily. Hold aspirin while you are on apixaban/Eliquis 1 tablet 0    atorvastatin (LIPITOR) 80 MG tablet TAKE 1 TABLET BY MOUTH ONCE DAILY 6 IN THE EVENING 30 tablet 6    BD PEN NEEDLE NANO 2ND GEN 32G X 4 MM MISC Inject into the skin 3 (three) times daily.      buPROPion (WELLBUTRIN SR) 150 MG 12 hr tablet Take 1 tablet by mouth twice daily 180 tablet 2    carvedilol (COREG) 12.5 MG tablet Take 1 tablet (12.5 mg total) by mouth 2 (two) times daily with a meal. 60 tablet 6    cholecalciferol (VITAMIN D3) 25 MCG (1000 UNIT) tablet Take 1,000 Units by mouth in the morning and at bedtime.       DULoxetine (CYMBALTA) 60 MG capsule Take 1 capsule by mouth once daily 90 capsule 2    ferrous sulfate 325 (65 FE) MG tablet Take 325 mg by mouth 2 (two) times daily with a meal.      furosemide (LASIX) 80 MG tablet Take 80 mg by mouth 2 (two) times daily.      insulin lispro protamine-lispro (HUMALOG 75/25 MIX) (75-25) 100 UNIT/ML SUSP injection Inject 10-25 Units into the skin See admin instructions. Inject 25 units with breakfast, 10 units at lunch, 25 units at dinner      levothyroxine (SYNTHROID) 175 MCG tablet 1 tablet in the morning on an empty stomach Orally Once a day      memantine (NAMENDA) 10 MG tablet Take 1 tablet (10 mg total) by mouth 2 (two) times daily. 180 tablet 1    nitroGLYCERIN (NITROSTAT) 0.4 MG SL tablet Place 0.4 mg under the tongue every 5 (five) minutes as needed for chest pain.      potassium chloride SA (KLOR-CON M) 20 MEQ tablet Take 20 mEq by mouth daily.      sodium bicarbonate 650 MG tablet Take 1 tablet (650 mg total) by mouth 2 (two) times daily. 20 tablet 0      Vitals   Vitals:   05/20/23 1351 05/20/23 1359 05/20/23 1400 05/20/23 1410  BP: 130/72 (!) 123/53 (!) 130/51 (!) 130/51  Pulse: 69 68 67 69  Resp: 20 16 14 16   Temp: (!) 97.3 F (36.3 C) (!) 97.3 F (36.3 C)    TempSrc:      SpO2: 95% 96% 96% 96%  Weight:         Body mass index is 32.38 kg/m.  Physical Exam   Constitutional: Appears acutely ill Psych: Affect appropriate to situation.  Eyes: No scleral injection.  HENT: No OP obstruction.  Head: Normocephalic.  Cardiovascular: Normal rate and regular rhythm.  Respiratory: Effort normal, non-labored breathing.  GI: Soft.  No distension. There is no tenderness.  Skin: Hematoma around right upper arm fistula site.   Neurologic Examination done at  1625   Neuro: Mental Status: Patient is awake, drowsy. States name, yes/no. States she is at [facility] and was taken from [facility]. Intermittent nausea/vomiting.  Cranial Nerves: II: Visual  Fields are full by blink to threat. Pupils are equal, round, and reactive to light.   III,IV, VI: Left gaze preference with nystagmus. Does not cross midline  V: Facial sensation is symmetric VII: Right facial droop VIII: Hearing is intact to voice X: Palate elevates symmetrically XI: Shoulder shrug is symmetric. XII: Tongue protrudes midline without atrophy or fasciculations.  Motor: Tone is normal. Bulk is normal. RUE 4-/5 ROM limited by hematoma  LUE 5/5  RLE 2/5 LLE 4/5 Sensory: Sensation is symmetric to light touch and temperature in the arms and legs.  Cerebellar: No obvious ataxia noted with FNF    Labs   CBC:  Recent Labs  Lab 05/20/23 1222  WBC 5.8  NEUTROABS 4.2  HGB 11.1*  HCT 36.1  MCV 91.9  PLT 186    Basic Metabolic Panel:  Lab Results  Component Value Date   NA 136 05/20/2023   K 3.5 05/20/2023   CO2 24 05/20/2023   GLUCOSE 234 (H) 05/20/2023   BUN 43 (H) 05/20/2023   CREATININE 4.03 (H) 05/20/2023   CALCIUM 8.8 (L) 05/20/2023   GFRNONAA 12 (L) 05/20/2023   GFRAA 43 (L) 02/05/2020   Lipid Panel:  Lab Results  Component Value Date   LDLCALC 83 03/13/2020   HgbA1c:  Lab Results  Component Value Date   HGBA1C 7.1 (H) 03/25/2022   Urine Drug Screen:     Component Value Date/Time   LABOPIA NONE DETECTED 11/14/2017 1109   COCAINSCRNUR NONE DETECTED 11/14/2017 1109   LABBENZ NONE DETECTED 11/14/2017 1109   AMPHETMU NONE DETECTED 11/14/2017 1109   THCU NONE DETECTED 11/14/2017 1109   LABBARB NONE DETECTED 11/14/2017 1109    Alcohol Level     Component Value Date/Time   ETH <10 05/20/2023 1222   INR  Lab Results  Component Value Date   INR 1.1 05/20/2023   APTT  Lab Results  Component Value Date   APTT 34 05/20/2023     CT Head without contrast(Personally reviewed): 1. No hemorrhage or CT evidence of an acute cortical infarct. Aspects is 10. 2. Sequela of severe chronic microvascular ischemic change. Chronic infarcts in the  right basal ganglia and left cerebellum.  CT angio Head and Neck with contrast(Personally reviewed): 1. Negative for large vessel occlusion. 2. Positive for chronic ICA siphon calcified plaque where stenosis appears progressed from a 2020 CTA, up to Moderate on the Left and Severe on the Right. Moderate Left PCA P2 segment stenosis also appears increased. Mild for age extracranial atherosclerosis without stenosis. 3.  Aortic Atherosclerosis (ICD10-I70.0).  Repeat CT Head  Acute hemorrhage in the right dorsal pons/brachium pontis measuring 7 x 8 mm. No significant mass effect.  Impression   DUBLIN HIGHSMITH is a 67 y.o. female   Primary Diagnosis:  Acute ischemic infarct s/p TNK with hemorrhagic transformation S/p TNK reversal (TXA and 10 units cryo)  Secondary Diagnosis: ESRD on home dialysis   Recommendations    CNS  Acute Ischemic Infarct s/p TNK with Hemorrhagic Transformation S/p TNK reversal  Continue Evaluation:  - Admit to: ICU - No antiplatelets due to ICH - DVT PPx heparin at 24 hrs if stable, SCDs for now - BP control goal SYS< 130-150  for first 24 hours, then <160  - Cleviprex IV and hydralazine PRN  -  Repeat Head CT in 4 hours (2000) -MRI/ECHO/A1C/Lipid panel. -Hyperglycemia management per SSI to maintain glucose 140-180mg /dL. -PT/OT/ST therapies and recommendations when able  Acute hemorrhage in the right dorsal pons/brachium pontis  Dysarthria Dysphagia following cerebral infarction  -NPO until cleared by speech -ST -Advance diet as tolerated -PT/OT/ST   RESP High risk for aspiration CCM consult [place] at 6L  CV Hypertension -Aggressive BP control, Blood Pressure Goal: SBP between 130-150 for 24 hours and then less than 160 -Cleviprex, hydralazine (Heart Rate in the 60s) Hyperlipidemia, unspecified  - Statin for goal LDL < 70  HEME Coagulopathy secondary to TNK administration -Reverse with TXA and cryo in the setting of hemorrhage  Dialysis  fistula hematoma -Appreciate vascular surgery following -ACE wrap in place per their recs -Pulse checks ordered   ENDO Type 2 -SSI -Start oral meds -goal HgbA1c < 7  GI/GU End Stage Renal Disease on HD T, TH, S -nephrology consult  Fluid/Electrolyte Disorders -Replete -Repeat labs -Trend  ID -CXR -NPO -Monitor  Nutrition NPO -diet consult  Prophylaxis DVT: SCDs GI: Protonix Bowel: Senna  Goals of care: - Full code for now but family notes would not want prolonged life support. Consider palliative care consult pending clinical course  ___________________________________________________________________  Patient seen and examined by NP/APP with MD. MD to update note as needed.   Elmer Picker, DNP, FNP-BC [facility] Pager: [phone]  Attending Neurologist's note:  I personally saw this patient, gathering history, performing a full neurologic examination, reviewing relevant labs, personally reviewing relevant imaging including original Head CT and repeat head CT showing ICH, and formulated the assessment and plan, adding the note above for completeness and clarity to accurately reflect my thoughts  Brooke Dare MD-PhD [facility] [phone] Available 7 AM to 7 PM, outside these hours please contact Neurologist on call listed on AMION   CRITICAL CARE Performed by: Gordy Councilman   Total critical care time: 140 minutes  Critical care time was exclusive of separately billable procedures and treating other patients.  Critical care was necessary to treat or prevent imminent or life-threatening deterioration.  Critical care was time spent personally by me on the following activities: development of treatment plan with patient and/or surrogate as well as nursing, discussions with consultants, evaluation of patient's response to treatment, examination of patient, obtaining history from patient or surrogate, ordering and performing  treatments and interventions, ordering and review of laboratory studies, ordering and review of radiographic studies, pulse oximetry and re-evaluation of patient's condition.

## 2023-05-20 NOTE — Consult Note (Signed)
NEUROLOGY TELECONSULTATION NOTE   Date of service: May 20, 2023 Patient Name: Jennifer [name] MRN:  [id number] DOB:  01-Nov-1955 Reason for consult: telestroke  Requesting Provider: Dr. Vanetta Mulders Consult Participants: myself, patient, bedside RN, telestroke RN Location of the provider: [facility] Location of the patient: [facility]  This consult was provided via telemedicine with 2-way video and audio communication. The patient/family was informed that care would be provided in this way and agreed to receive care in this manner.   _ _ _   _ __   _ __ _ _  __ __   _ __   __ _  History of Present Illness   This is a 67 year old woman with past medical history significant for CAD, hyperlipidemia, hypertension, hypothyroidism, CKD on hemodialysis at home, stroke in 2020 with residual mild left-sided weakness, diabetes who presents with acute onset complete expressive aphasia and worsening left-sided weakness.  This morning she woke up and was at baseline.  She had mild left arm weakness secondary to prior stroke, ambulates for very short distances on her own but typically with a wheelchair, has dementia but is typically oriented to self and family members and conversational.  This morning patient had started dialysis at home with her daughter who is a Engineer, civil (consulting).  At approximately 11 AM she became acutely nonverbal and stopped moving her left upper extremity.  She was unable to walk.  On my examination her NIH stroke scale was 16; she remained aphasic but was moving both upper extremities equally.  She had no movement against gravity in either lower extremity.  Head CT showed no acute process and was interpreted by me prior to administration of TNK.  Risks, benefits, and TNK were discussed with daughter [name].  Patient received 2000 units of heparin at 10:30 AM for dialysis.  Given her disabling deficit but she is now nonverbal, platelets, PTT, and INR were checked and were normal therefore TNK was offered.   Daughter [name] gave informed consent to proceed.  CTA showed no LVO.   ROS   UTA 2/2 nonverbal  Past History   The following was personally reviewed:  Past Medical History:  Diagnosis Date   CAD (coronary artery disease)    cath 5/23 100% dist RCA lesion treated with 2 overlapping Integrity Resolute DES ( 2.25x68mm, 2.25x45mm), 60% mid RCA treated medically, 99% OM2 not amenable to PCI, 70% D1 lesion, EF normal   Hypercholesteremia    Hypertension    Hypothyroidism    Kidney disease    Renal disorder    Stroke (HCC) 10/2018   Thyroid disease    Type 2 diabetes mellitus (HCC) 10/08/2018   Past Surgical History:  Procedure Laterality Date   ABDOMINAL HYSTERECTOMY     AV FISTULA PLACEMENT Right 06/28/2022   Procedure: RIGHT ARM ARTERIOVENOUS (AV) FISTULA CREATION;  [name]: Larina Earthly, MD;  Location: [facility];  Service: Vascular;  Laterality: Right;   CARDIAC CATHETERIZATION N/A 11/03/2015   Procedure: Left Heart Cath and Coronary Angiography;  [name]: Marykay Lex, MD;  Location: [facility] INVASIVE CV LAB;  Service: Cardiovascular;  Laterality: N/A;   CARDIAC CATHETERIZATION N/A 11/03/2015   Procedure: Coronary Stent Intervention;  [name]: Marykay Lex, MD;  Location: [facility] INVASIVE CV LAB;  Service: Cardiovascular;  Laterality: N/A;   CARPAL TUNNEL RELEASE     CORONARY ANGIOPLASTY     1 stent   FISTULA SUPERFICIALIZATION Right 08/16/2022   Procedure: RIGHT ARTERIOVENOUS FISTULA SUPERFICIALIZATION;  [name]: Larina Earthly,  MD;  Location: [facility];  Service: Vascular;  Laterality: Right;   TOTAL KNEE ARTHROPLASTY Right 04/02/2019   Procedure: TOTAL KNEE ARTHROPLASTY;  [name]: Vickki Hearing, MD;  Location: [facility];  Service: Orthopedics;  Laterality: Right;   Family History  Problem Relation Age of Onset   Heart disease Mother    Stroke Sister    Heart attack Brother    Breast cancer Neg Hx    Social History   Socioeconomic History   Marital status: Married    Spouse  name: Not on file   Number of children: Not on file   Years of education: Not on file   Highest education level: Not on file  Occupational History   Not on file  Tobacco Use   Smoking status: Former    Current packs/day: 0.00    Types: Cigarettes    Quit date: 05/13/2021    Years since quitting: 2.0   Smokeless tobacco: Never  Vaping Use   Vaping status: Never Used  Substance and Sexual Activity   Alcohol use: No    Alcohol/week: 0.0 standard drinks of alcohol   Drug use: No   Sexual activity: Not on file  Other Topics Concern   Not on file  Social History Narrative   Not on file   Social Determinants of Health   Financial Resource Strain: Low Risk  (03/07/2023)   Overall Financial Resource Strain (CARDIA)    Difficulty of Paying Living Expenses: Not hard at all  Food Insecurity: No Food Insecurity (03/07/2023)   Hunger Vital Sign    Worried About Running Out of Food in the Last Year: Never true    Ran Out of Food in the Last Year: Never true  Transportation Needs: No Transportation Needs (03/07/2023)   PRAPARE - Administrator, Civil Service (Medical): No    Lack of Transportation (Non-Medical): No  Physical Activity: Inactive (03/07/2023)   Exercise Vital Sign    Days of Exercise per Week: 0 days    Minutes of Exercise per Session: 0 min  Stress: No Stress Concern Present (03/07/2023)   [employer] of Occupational Health - Occupational Stress Questionnaire    Feeling of Stress : Not at all  Social Connections: Socially Integrated (03/07/2023)   Social Connection and Isolation Panel [NHANES]    Frequency of Communication with Friends and Family: More than three times a week    Frequency of Social Gatherings with Friends and Family: More than three times a week    Attends Religious Services: More than 4 times per year    Active Member of [employer] or Organizations: Yes    Attends Engineer, structural: More than 4 times per year    Marital Status:  Married   Allergies  Allergen Reactions   Metformin Hcl Other (See Comments)    Unknown reaction     Medications   (Not in a hospital admission)    No current facility-administered medications for this encounter.  Current Outpatient Medications:    folic acid (FOLVITE) 1 MG tablet, Take 1 tablet by mouth once daily, Disp: 30 tablet, Rfl: 0   amLODipine (NORVASC) 10 MG tablet, Take 1 tablet by mouth once daily (Patient not taking: Reported on 04/19/2023), Disp: 90 tablet, Rfl: 3   aspirin EC 81 MG tablet, Take 1 tablet (81 mg total) by mouth 2 (two) times daily. Hold aspirin while you are on apixaban/Eliquis, Disp: 1 tablet, Rfl: 0   atorvastatin (LIPITOR) 80 MG  tablet, TAKE 1 TABLET BY MOUTH ONCE DAILY 6 IN THE EVENING, Disp: 30 tablet, Rfl: 6   BD PEN NEEDLE NANO 2ND GEN 32G X 4 MM MISC, Inject into the skin 3 (three) times daily., Disp: , Rfl:    buPROPion (WELLBUTRIN SR) 150 MG 12 hr tablet, Take 1 tablet by mouth twice daily, Disp: 180 tablet, Rfl: 2   carvedilol (COREG) 12.5 MG tablet, Take 1 tablet (12.5 mg total) by mouth 2 (two) times daily with a meal., Disp: 60 tablet, Rfl: 6   cholecalciferol (VITAMIN D3) 25 MCG (1000 UNIT) tablet, Take 1,000 Units by mouth in the morning and at bedtime., Disp: , Rfl:    DULoxetine (CYMBALTA) 60 MG capsule, Take 1 capsule by mouth once daily, Disp: 90 capsule, Rfl: 2   ferrous sulfate 325 (65 FE) MG tablet, Take 325 mg by mouth 2 (two) times daily with a meal., Disp: , Rfl:    furosemide (LASIX) 80 MG tablet, Take 80 mg by mouth 2 (two) times daily., Disp: , Rfl:    insulin lispro protamine-lispro (HUMALOG 75/25 MIX) (75-25) 100 UNIT/ML SUSP injection, Inject 10-25 Units into the skin See admin instructions. Inject 25 units with breakfast, 10 units at lunch, 25 units at dinner, Disp: , Rfl:    levothyroxine (SYNTHROID) 175 MCG tablet, 1 tablet in the morning on an empty stomach Orally Once a day, Disp: , Rfl:    memantine (NAMENDA) 10 MG  tablet, Take 1 tablet (10 mg total) by mouth 2 (two) times daily., Disp: 180 tablet, Rfl: 1   nitroGLYCERIN (NITROSTAT) 0.4 MG SL tablet, Place 0.4 mg under the tongue every 5 (five) minutes as needed for chest pain., Disp: , Rfl:    potassium chloride SA (KLOR-CON M) 20 MEQ tablet, Take 20 mEq by mouth daily., Disp: , Rfl:    sodium bicarbonate 650 MG tablet, Take 1 tablet (650 mg total) by mouth 2 (two) times daily., Disp: 20 tablet, Rfl: 0  Vitals   Vitals:   05/20/23 1240 05/20/23 1242 05/20/23 1301  BP:  (!) 132/47   Pulse:  68   Resp:  16   Temp:   (!) 97.1 F (36.2 C)  TempSrc:   Axillary  SpO2:  98%   Weight: 91 kg       Body mass index is 32.38 kg/m.  Physical Exam   Exam performed over telemedicine with 2-way video and audio communication and with assistance of bedside RN  Physical Exam Gen: alert, nonverbal, NAD Resp: normal WOB CV: extremities appear well-perfused  Neuro: *MS: alert, unable to answer orientation questions 2/2 nonverbal, able to follow simple commands *Speech: mute, global aphasia *CN: PERRL 3mm, EOMI, blinks to threat bilat, sensation intact, L UMN facial droop, hearing intact to voice *Motor:   Normal bulk.  No tremor, rigidity or bradykinesia. Drift BUE but not to bed, no movement against gravity BLE *Sensory: SILT. Symmetric. *Coordination:  UTA 2/2 AMS *Reflexes:  UTA 2/2 tele-exam *Gait: deferred  NIHSS  1a Level of Conscious.: 0 1b LOC Questions: 2 1c LOC Commands: 0 2 Best Gaze: 0 3 Visual: 0 4 Facial Palsy: 1 5a Motor Arm - left: 1 5b Motor Arm - Right: 1 6a Motor Leg - Left: 3 6b Motor Leg - Right: 3 7 Limb Ataxia: 0 8 Sensory: 0 9 Best Language: 3 10 Dysarthria: 2 11 Extinct. and Inatten.: 0  TOTAL: 16   Premorbid mRS = 4   Labs   CBC:  Recent  Labs  Lab 05/20/23 1222  WBC 5.8  NEUTROABS 4.2  HGB 11.1*  HCT 36.1  MCV 91.9  PLT 186    Basic Metabolic Panel:  Lab Results  Component Value Date   NA  136 05/20/2023   K 3.5 05/20/2023   CO2 24 05/20/2023   GLUCOSE 234 (H) 05/20/2023   BUN 43 (H) 05/20/2023   CREATININE 4.03 (H) 05/20/2023   CALCIUM 8.8 (L) 05/20/2023   GFRNONAA 12 (L) 05/20/2023   GFRAA 43 (L) 02/05/2020   Lipid Panel:  Lab Results  Component Value Date   LDLCALC 83 03/13/2020   HgbA1c:  Lab Results  Component Value Date   HGBA1C 7.1 (H) 03/25/2022   Urine Drug Screen:     Component Value Date/Time   LABOPIA NONE DETECTED 11/14/2017 1109   COCAINSCRNUR NONE DETECTED 11/14/2017 1109   LABBENZ NONE DETECTED 11/14/2017 1109   AMPHETMU NONE DETECTED 11/14/2017 1109   THCU NONE DETECTED 11/14/2017 1109   LABBARB NONE DETECTED 11/14/2017 1109    Alcohol Level     Component Value Date/Time   ETH <10 05/20/2023 1222    CT Head without contrast: No acute process ASPECTS 10  CT angio Head and Neck with contrast: No LVO  CNS imaging personally reviewed  Impression   This is a 67 year old woman with past medical history significant for CAD, hyperlipidemia, hypertension, hypothyroidism, CKD on hemodialysis at home, stroke in 2020 with residual mild left-sided weakness, diabetes who presents with acute onset complete expressive aphasia and worsening left-sided weakness at 11AM.  Head CT showed no acute process and was interpreted by me prior to administration of TNK.  Risks, benefits, and TNK were discussed with daughter [name].  Patient received 2000 units of heparin at 10:30 AM for dialysis.  Given her disabling deficit but she is now nonverbal, platelets, PTT, and INR were checked and were normal therefore TNK was offered.  Daughter [name] gave informed consent to proceed.  CTA showed no LVO.  Recommendations   - Admit to 4N neuro ICU at [facility] under Dr. Iver Nestle - Neurochecks and NIHSS documentation per post-TNK protocol - STAT head CT for any change in neurologic exam - Non-con head CT 24 hrs post-TNK r/o hemorrhagic conversion - MRI brain wo contrast -  TTE - no aspirin for 24 hours post TNK and until ICH ruled out by repeat head CT - keep SBP less than 180/105 for the 1st 24 hours post TNK to reduce the risk of hemorrhagic transformation - SCDs for DVT prophylaxis; can start SQ Heparin if head CT 24 hours post TNK is negative for ICH - NPO; swallow study - PT/OT and speech therapy - stroke education - outpatient f/u with neurology after discharge - She received contrast for CTA, please consult nephrology for dialysis after transfer  This patient is critically ill and at significant risk of neurological worsening, death and care requires constant monitoring of vital signs, hemodynamics,respiratory and cardiac monitoring, neurological assessment, discussion with family, other specialists and medical decision making of high complexity. I spent 70 minutes of neurocritical care time in the care of  this patient. This was time spent independent of any time provided by nurse practitioner or PA.  Bing Neighbors, MD [facility] [phone]  If 7pm- 7am, please page neurology on call as listed in AMION.

## 2023-05-20 NOTE — Progress Notes (Signed)
Patient arrived from [facility] with two 10cc saline syringes attached to R bicep AV fistula. This bedside RN was informed tNK was administered through the AV fistula by [facility]. This bedside RN was not advised this in telephone report. However, AV fistula has grown in size, borders marked. Palpable thrill, and bruit auscultated. Stroke/Neurology NP paged. CCM paged. Dialysis RN called, nephrology NP paged.  1530 - This bedside RN and team noticed worsening droop of right face. Patient complaining of nausea, emesis x2. EKG being captured simultaneously. IV Zofran given. Orders for a STAT head CT. See results review tab. Ludger Nutting, NP, and Dr, Iver Nestle at bedside throughout transport.   1545 - Patient increasingly less responsive, O2 applied at 10L throughout transport. O2 saturations increasing within normal limits.   1600 - Confirmed by family, that tNK was not administered through AV fistula despite being told by [facility], but had become infiltrated PTA (during home HD session). Nephrology MD, Dr. Allena Katz, at bedside and deaccessed AV fistula. ACE bandage applied, with verbal orders to not loosen grossly.   1630 - TXA administered, 2U of emergency release cryoprecipitate administered.   1700 - issues with cryoprecipitate unit #2. Called blood bank to confirm BOTH units are the same, and blood bank and bedside RN, and second verifier in agreeance. Unit number that caused issues within the computer is #[id number]   Mammie Russian, RN

## 2023-05-20 NOTE — Consult Note (Signed)
Hospital Consult    Reason for Consult: Right arm fistula hematoma Requesting Service:  Neurology  MRN #:  [id number]  History of Present Illness: This is a 67 y.o. female with ESRD on HD via a right upper extremity radiocephalic fistula.  She has a history of prior CVA with mild left arm weakness.  She is admitted to the neuro ICU after sustaining a stroke with symptoms of aphasia and left arm paralysis at approximately 11 AM while she was performing home dialysis.  She presented to [facility] and a CT head was negative for any acute process and was given TNK.  During this time she was noted to have infiltration of her fistula which per report occurred at home.  Her symptoms were noted to be worsening and she was transferred to [facility] where it was noted that she had hemorrhagic transformation during this time the right arm fistula continued to swell.  TNK was reversed and the arm was wrapped with an Ace bandage.  Past Medical History:  Diagnosis Date   CAD (coronary artery disease)    cath 5/23 100% dist RCA lesion treated with 2 overlapping Integrity Resolute DES ( 2.25x23mm, 2.25x89mm), 60% mid RCA treated medically, 99% OM2 not amenable to PCI, 70% D1 lesion, EF normal   Hypercholesteremia    Hypertension    Hypothyroidism    Kidney disease    Renal disorder    Stroke (HCC) 10/2018   Thyroid disease    Type 2 diabetes mellitus (HCC) 10/08/2018    Past Surgical History:  Procedure Laterality Date   ABDOMINAL HYSTERECTOMY     AV FISTULA PLACEMENT Right 06/28/2022   Procedure: RIGHT ARM ARTERIOVENOUS (AV) FISTULA CREATION;  Surgeon: Larina Earthly, MD;  Location: [facility];  Service: Vascular;  Laterality: Right;   CARDIAC CATHETERIZATION N/A 11/03/2015   Procedure: Left Heart Cath and Coronary Angiography;  Surgeon: Marykay Lex, MD;  Location: [facility] INVASIVE CV LAB;  Service: Cardiovascular;  Laterality: N/A;   CARDIAC CATHETERIZATION N/A 11/03/2015   Procedure: Coronary Stent  Intervention;  Surgeon: Marykay Lex, MD;  Location: [facility] INVASIVE CV LAB;  Service: Cardiovascular;  Laterality: N/A;   CARPAL TUNNEL RELEASE     CORONARY ANGIOPLASTY     1 stent   FISTULA SUPERFICIALIZATION Right 08/16/2022   Procedure: RIGHT ARTERIOVENOUS FISTULA SUPERFICIALIZATION;  Surgeon: Larina Earthly, MD;  Location: [facility];  Service: Vascular;  Laterality: Right;   TOTAL KNEE ARTHROPLASTY Right 04/02/2019   Procedure: TOTAL KNEE ARTHROPLASTY;  Surgeon: Vickki Hearing, MD;  Location: [facility];  Service: Orthopedics;  Laterality: Right;    Allergies  Allergen Reactions   Metformin Hcl Other (See Comments)    Unknown reaction     Prior to Admission medications   Medication Sig Start Date End Date Taking? Authorizing Provider  folic acid (FOLVITE) 1 MG tablet Take 1 tablet by mouth once daily 05/15/23   Micki Riley, MD  amLODipine (NORVASC) 10 MG tablet Take 1 tablet by mouth once daily Patient not taking: Reported on 04/19/2023 05/11/22   Mallipeddi, Vishnu P, MD  aspirin EC 81 MG tablet Take 1 tablet (81 mg total) by mouth 2 (two) times daily. Hold aspirin while you are on apixaban/Eliquis 04/06/19   Emokpae, Courage, MD  atorvastatin (LIPITOR) 80 MG tablet TAKE 1 TABLET BY MOUTH ONCE DAILY 6 IN THE EVENING 10/20/22   Mallipeddi, Vishnu P, MD  BD PEN NEEDLE NANO 2ND GEN 32G X 4 MM MISC Inject  into the skin 3 (three) times daily. 07/22/21   [provider]  buPROPion (WELLBUTRIN SR) 150 MG 12 hr tablet Take 1 tablet by mouth twice daily 10/19/22   [name], Audrie Gallus, MD  carvedilol (COREG) 12.5 MG tablet Take 1 tablet (12.5 mg total) by mouth 2 (two) times daily with a meal. 10/20/22   Mallipeddi, Vishnu P, MD  cholecalciferol (VITAMIN D3) 25 MCG (1000 UNIT) tablet Take 1,000 Units by mouth in the morning and at bedtime.    [provider]  DULoxetine (CYMBALTA) 60 MG capsule Take 1 capsule by mouth once daily 10/19/22   [name], Audrie Gallus, MD  ferrous sulfate 325 (65 FE) MG  tablet Take 325 mg by mouth 2 (two) times daily with a meal.    [provider]  furosemide (LASIX) 80 MG tablet Take 80 mg by mouth 2 (two) times daily. 10/27/22   [provider]  insulin lispro protamine-lispro (HUMALOG 75/25 MIX) (75-25) 100 UNIT/ML SUSP injection Inject 10-25 Units into the skin See admin instructions. Inject 25 units with breakfast, 10 units at lunch, 25 units at dinner    [provider]  levothyroxine (SYNTHROID) 175 MCG tablet 1 tablet in the morning on an empty stomach Orally Once a day    [provider]  memantine (NAMENDA) 10 MG tablet Take 1 tablet (10 mg total) by mouth 2 (two) times daily. 02/22/23   Micki Riley, MD  nitroGLYCERIN (NITROSTAT) 0.4 MG SL tablet Place 0.4 mg under the tongue every 5 (five) minutes as needed for chest pain.    [provider]  potassium chloride SA (KLOR-CON M) 20 MEQ tablet Take 20 mEq by mouth daily.    [provider]  sodium bicarbonate 650 MG tablet Take 1 tablet (650 mg total) by mouth 2 (two) times daily. 03/25/22   Almon Hercules, MD    Social History   Socioeconomic History   Marital status: Married    Spouse name: Not on file   Number of children: Not on file   Years of education: Not on file   Highest education level: Not on file  Occupational History   Not on file  Tobacco Use   Smoking status: Former    Current packs/day: 0.00    Types: Cigarettes    Quit date: 05/13/2021    Years since quitting: 2.0   Smokeless tobacco: Never  Vaping Use   Vaping status: Never Used  Substance and Sexual Activity   Alcohol use: No    Alcohol/week: 0.0 standard drinks of alcohol   Drug use: No   Sexual activity: Not on file  Other Topics Concern   Not on file  Social History Narrative   Not on file   Social Determinants of Health   Financial Resource Strain: Low Risk  (03/07/2023)   Overall Financial Resource Strain (CARDIA)    Difficulty of Paying Living Expenses:  Not hard at all  Food Insecurity: No Food Insecurity (03/07/2023)   Hunger Vital Sign    Worried About Running Out of Food in the Last Year: Never true    Ran Out of Food in the Last Year: Never true  Transportation Needs: No Transportation Needs (03/07/2023)   PRAPARE - Administrator, Civil Service (Medical): No    Lack of Transportation (Non-Medical): No  Physical Activity: Inactive (03/07/2023)   Exercise Vital Sign    Days of Exercise per Week: 0 days    Minutes of Exercise  per Session: 0 min  Stress: No Stress Concern Present (03/07/2023)   [employer] of Occupational Health - Occupational Stress Questionnaire    Feeling of Stress : Not at all  Social Connections: Socially Integrated (03/07/2023)   Social Connection and Isolation Panel [NHANES]    Frequency of Communication with Friends and Family: More than three times a week    Frequency of Social Gatherings with Friends and Family: More than three times a week    Attends Religious Services: More than 4 times per year    Active Member of [employer] or Organizations: Yes    Attends Engineer, structural: More than 4 times per year    Marital Status: Married  Catering manager Violence: Not At Risk (03/07/2023)   Humiliation, Afraid, Rape, and Kick questionnaire    Fear of Current or Ex-Partner: No    Emotionally Abused: No    Physically Abused: No    Sexually Abused: No    Family History  Problem Relation Age of Onset   Heart disease Mother    Stroke Sister    Heart attack Brother    Breast cancer Neg Hx     ROS: Otherwise negative unless mentioned in HPI  Physical Examination  Vitals:   05/20/23 1645 05/20/23 1709  BP: 118/64   Pulse: 72 68  Resp: 18 16  Temp: (!) 96.6 F (35.9 C) (!) 96.6 F (35.9 C)  SpO2: 100% 100%   Body mass index is 32.38 kg/m.  Cardiac: hemodynamically stable, nontachycardic Pulm: normal work of breathing Extremities: Right upper arm wrapped in Ace bandage,  palpable pulse and right arm fistula with slight thrill.  Right radial artery palpable.  Right arm motor and sensory intact.  ASSESSMENT/PLAN: This is a 67 y.o. female with ESRD on home HD via a right expelled fistula who sustained a CVA while on dialysis at home.  This was complicated by hemorrhagic conversion with TNA administration and a significant hematoma around the right upper extremity AV fistula.  -Recommend continued Ace wrap overnight will plan to take down the wrap and better evaluate the access in the morning. -Explained to the patient and the family that it is possible that this fistula thromboses due to pressure from the hematoma and the products that she was given.  If this does occur or if the hematoma is too extensive that it is unable to be accessed she will likely need a tunneled dialysis catheter -Vascular surgery will continue to follow.   Jennifer Pastures MD [facility] [phone] 05/20/2023  5:12 PM

## 2023-05-20 NOTE — ED Notes (Signed)
PT BIB [facility] from home. Pt was getting home hemodialysis and pt "became unable to speak and had left sided deficits" per EMS.

## 2023-05-20 NOTE — Progress Notes (Signed)
Code Stroke cart activated @1215 . Pt arrived via EMS. LKWT 1130. Family reports pt became nonverbal with left-sided weakness during dialysis. Hx of stroke with residual LUE weakness per last Neurology note. Pt received dose of Heparin prior to arrival for HD- labs sent. mRS 4. Pt transported to CT @1218 .  Dr. Selina Cooley paged @1217  and on cart @1222   TNK pre-pull requested by Dr. Petra Kuba while she speaks to family regarding consent. TSRN requested an updated weight.  Pt returned from CT @1238   Labs WNL @1249 . No other contraindications identified. TNK order placed @1252 . Timeout performed- weight, dose, and recent BP confirmed. TNK bolus @1254 .  TSRN remained on cart until @1324  for post-lytic monitoring.No signs or symptoms of an adverse reaction identified at this time. Telestroke Charity fundraiser

## 2023-05-20 NOTE — Consult Note (Signed)
NAME:  Jennifer [name], MRN:  [id number], DOB:  [birth year], LOS: 0 ADMISSION DATE:  05/20/2023, CONSULTATION DATE:  05/20/2023 REFERRING MD: Stroke team, CHIEF COMPLAINT: Assistance with medical management  History of Present Illness:  Jennifer [name] is a 67 year old female with past medical history significant for ESRD on HD, HTN, HLD, type 2 diabetes, and CAD who presented to the ED 12/7 at  [facility] with expressive aphasia and worsening left-sided weakness.  C head CT showed no acute processes and patient was administered TNK.  CTA negative for LVO  On arrival to ICU patient had worsening of facial droop and became nauseous and vomited x 1 stat head CT repeated and showed acute hemorrhage in the right dorsal pons/brachium pontis measuring 7 x 8, no significant mass effect.  Given acute change with now conversion to hemorrhagic stroke PCCM consulted for assistance in management  Pertinent  Medical History  ESRD on HD, HTN, HLD, type 2 diabetes, and CAD   Significant Hospital Events: Including procedures, antibiotic start and stop dates in addition to other pertinent events   12/7 presented with expressive aphasia and worsening left-sided weakness code stroke CT negative patient received TNK.  Acute neurochange on arrival to ICU prompted repeat head CT which revealed acute hemorrhage  Interim History / Subjective:  As above  Objective   Blood pressure (!) 140/64, pulse 72, temperature (!) 97 F (36.1 C), temperature source Axillary, resp. rate 15, weight 91 kg, SpO2 100%.       No intake or output data in the 24 hours ending 05/20/23 1709 Filed Weights   05/20/23 1240  Weight: 91 kg    Examination: General: Chronic ill-appearing deconditioned middle-aged female lying in bed in no acute distress HEENT: [place]/AT, MM pink/moist, PERRL,  Neuro: Alert and oriented x 2, slightly confused, drowsy CV: s1s2 regular rate and rhythm, no murmur, rubs, or gallops,  PULM: Clear to auscultation  bilaterally, no increased work of breathing, no added breath sounds GI: soft, bowel sounds active in all 4 quadrants, non-tender, non-distended Extremities: warm/dry, no edema  Skin: no rashes or lesions  Resolved Hospital Problem list     Assessment & Plan:  Acute ischemic infarction s/p TNK with hemorrhagic transformation -Presented with expressive aphasia and worsening left-sided weakness code stroke CT negative patient received TNK.  Acute neurochange on arrival to ICU prompted repeat head CT which revealed acute hemorrhage in the right dorsal pons/brachium pontis.  TNK reversed P: Primary management per neurology Frequent neurochecks Monitor closely for ability to protect airway Secondary stroke prevention's Follow echo SBP goal per neurology PT/OT/SLP as able  At risk for respiratory decompensation -Patient vomited once with no signs of aspiration.  On bedside assessment post repeat CT patient stable and currently protecting airway P: Close monitoring in the ICU Aspiration precautions N.p.o.  Hypertension P: Continuous telemetry SBP goal per neurology Continue Cleviprex As needed IV hydralazine  Type 2 diabetes P: SSI CBG goal 140-180 CBG checks every 4  End-stage renal disease on IHD T/T/S P: Nephrology consulted, appreciate assistance IHD per nephrology  Likely protein calorie malnutrition Hypoalbuminemia P: N.p.o. currently  If requires intubation start tube feeds early  Best Practice (right click and "Reselect all SmartList Selections" daily)   Diet/type: NPO DVT prophylaxis: SCD's Start: 05/20/23 1611 Pressure ulcer(s): not present on admission  GI prophylaxis: PPI Lines: N/A Foley:  N/A Code Status:  full code Last date of multidisciplinary goals of care discussion: Pending   Labs   CBC:  Recent Labs  Lab 05/20/23 1222  WBC 5.8  NEUTROABS 4.2  HGB 11.1*  HCT 36.1  MCV 91.9  PLT 186    Basic Metabolic Panel: Recent Labs  Lab  05/20/23 1222  NA 136  K 3.5  CL 96*  CO2 24  GLUCOSE 234*  BUN 43*  CREATININE 4.03*  CALCIUM 8.8*   GFR: Estimated Creatinine Clearance: 15.2 mL/min (A) (by C-G formula based on SCr of 4.03 mg/dL (H)). Recent Labs  Lab 05/20/23 1222  WBC 5.8    Liver Function Tests: Recent Labs  Lab 05/20/23 1222  AST 21  ALT 19  ALKPHOS 53  BILITOT 0.6  PROT 6.1*  ALBUMIN 2.9*   No results for input(s): "LIPASE", "AMYLASE" in the last 168 hours. No results for input(s): "AMMONIA" in the last 168 hours.  ABG    Component Value Date/Time   HCO3 23.2 03/22/2022 1657   TCO2 17 (L) 08/16/2022 0852   ACIDBASEDEF 2.5 (H) 03/22/2022 1657   O2SAT 36.9 03/22/2022 1657     Coagulation Profile: Recent Labs  Lab 05/20/23 1222  INR 1.1    Cardiac Enzymes: No results for input(s): "CKTOTAL", "CKMB", "CKMBINDEX", "TROPONINI" in the last 168 hours.  HbA1C: Hemoglobin A1C  Date/Time Value Ref Range Status  03/20/2017 12:00 AM 7.0  Final   Hgb A1c MFr Bld  Date/Time Value Ref Range Status  03/25/2022 03:05 PM 7.1 (H) 4.8 - 5.6 % Final    Comment:    (NOTE) Pre diabetes:          5.7%-6.4%  Diabetes:              >6.4%  Glycemic control for   <7.0% adults with diabetes   11/19/2021 06:45 PM 8.6 (H) 4.8 - 5.6 % Final    Comment:    (NOTE) Pre diabetes:          5.7%-6.4%  Diabetes:              >6.4%  Glycemic control for   <7.0% adults with diabetes     CBG: Recent Labs  Lab 05/20/23 1216  GLUCAP 183*    Review of Systems:   Please see the history of present illness. All other systems reviewed and are negative    Past Medical History:  She,  has a past medical history of CAD (coronary artery disease), Hypercholesteremia, Hypertension, Hypothyroidism, Kidney disease, Renal disorder, Stroke (HCC) (10/2018), Thyroid disease, and Type 2 diabetes mellitus (HCC) (10/08/2018).   Surgical History:   Past Surgical History:  Procedure Laterality Date   ABDOMINAL  HYSTERECTOMY     AV FISTULA PLACEMENT Right 06/28/2022   Procedure: RIGHT ARM ARTERIOVENOUS (AV) FISTULA CREATION;  Surgeon: Larina Earthly, MD;  Location: [facility];  Service: Vascular;  Laterality: Right;   CARDIAC CATHETERIZATION N/A 11/03/2015   Procedure: Left Heart Cath and Coronary Angiography;  Surgeon: Marykay Lex, MD;  Location: [facility] INVASIVE CV LAB;  Service: Cardiovascular;  Laterality: N/A;   CARDIAC CATHETERIZATION N/A 11/03/2015   Procedure: Coronary Stent Intervention;  Surgeon: Marykay Lex, MD;  Location: [facility] INVASIVE CV LAB;  Service: Cardiovascular;  Laterality: N/A;   CARPAL TUNNEL RELEASE     CORONARY ANGIOPLASTY     1 stent   FISTULA SUPERFICIALIZATION Right 08/16/2022   Procedure: RIGHT ARTERIOVENOUS FISTULA SUPERFICIALIZATION;  Surgeon: Larina Earthly, MD;  Location: [facility];  Service: Vascular;  Laterality: Right;   TOTAL KNEE ARTHROPLASTY Right 04/02/2019   Procedure: TOTAL KNEE  ARTHROPLASTY;  Surgeon: Vickki Hearing, MD;  Location: [facility];  Service: Orthopedics;  Laterality: Right;     Social History:   reports that she quit smoking about 2 years ago. Her smoking use included cigarettes. She has never used smokeless tobacco. She reports that she does not drink alcohol and does not use drugs.   Family History:  Her family history includes Heart attack in her brother; Heart disease in her mother; Stroke in her sister. There is no history of Breast cancer.   Allergies Allergies  Allergen Reactions   Metformin Hcl Other (See Comments)    Unknown reaction      Home Medications  Prior to Admission medications   Medication Sig Start Date End Date Taking? Authorizing Provider  folic acid (FOLVITE) 1 MG tablet Take 1 tablet by mouth once daily 05/15/23   Micki Riley, MD  amLODipine (NORVASC) 10 MG tablet Take 1 tablet by mouth once daily Patient not taking: Reported on 04/19/2023 05/11/22   Mallipeddi, Vishnu P, MD  aspirin EC 81 MG tablet Take 1 tablet (81 mg  total) by mouth 2 (two) times daily. Hold aspirin while you are on apixaban/Eliquis 04/06/19   Emokpae, Courage, MD  atorvastatin (LIPITOR) 80 MG tablet TAKE 1 TABLET BY MOUTH ONCE DAILY 6 IN THE EVENING 10/20/22   Mallipeddi, Vishnu P, MD  BD PEN NEEDLE NANO 2ND GEN 32G X 4 MM MISC Inject into the skin 3 (three) times daily. 07/22/21   [provider]  buPROPion (WELLBUTRIN SR) 150 MG 12 hr tablet Take 1 tablet by mouth twice daily 10/19/22   [name], Audrie Gallus, MD  carvedilol (COREG) 12.5 MG tablet Take 1 tablet (12.5 mg total) by mouth 2 (two) times daily with a meal. 10/20/22   Mallipeddi, Vishnu P, MD  cholecalciferol (VITAMIN D3) 25 MCG (1000 UNIT) tablet Take 1,000 Units by mouth in the morning and at bedtime.    [provider]  DULoxetine (CYMBALTA) 60 MG capsule Take 1 capsule by mouth once daily 10/19/22   [name], Audrie Gallus, MD  ferrous sulfate 325 (65 FE) MG tablet Take 325 mg by mouth 2 (two) times daily with a meal.    [provider]  furosemide (LASIX) 80 MG tablet Take 80 mg by mouth 2 (two) times daily. 10/27/22   [provider]  insulin lispro protamine-lispro (HUMALOG 75/25 MIX) (75-25) 100 UNIT/ML SUSP injection Inject 10-25 Units into the skin See admin instructions. Inject 25 units with breakfast, 10 units at lunch, 25 units at dinner    [provider]  levothyroxine (SYNTHROID) 175 MCG tablet 1 tablet in the morning on an empty stomach Orally Once a day    [provider]  memantine (NAMENDA) 10 MG tablet Take 1 tablet (10 mg total) by mouth 2 (two) times daily. 02/22/23   Micki Riley, MD  nitroGLYCERIN (NITROSTAT) 0.4 MG SL tablet Place 0.4 mg under the tongue every 5 (five) minutes as needed for chest pain.    [provider]  potassium chloride SA (KLOR-CON M) 20 MEQ tablet Take 20 mEq by mouth daily.    [provider]  sodium bicarbonate 650 MG tablet Take 1 tablet (650 mg total) by mouth 2 (two) times daily.  03/25/22   Almon Hercules, MD     Critical care time:   CRITICAL CARE Performed by: Roslin Norwood D. Harris  Total critical care time: 42 minutes  Critical care time was exclusive of separately billable  procedures and treating other patients.  Critical care was necessary to treat or prevent imminent or life-threatening deterioration.  Critical care was time spent personally by me on the following activities: development of treatment plan with patient and/or surrogate as well as nursing, discussions with consultants, evaluation of patient's response to treatment, examination of patient, obtaining history from patient or surrogate, ordering and performing treatments and interventions, ordering and review of laboratory studies, ordering and review of radiographic studies, pulse oximetry and re-evaluation of patient's condition.  Henchy Mccauley D. Harris, NP-C [facility] Personal contact information can be found on Amion  If no contact or response made please call [phone] 05/20/2023, 5:35 PM

## 2023-05-20 NOTE — ED Provider Notes (Addendum)
[place] EMERGENCY DEPARTMENT AT [facility] Provider Note   CSN: [id number] Arrival date & time: 05/20/23  1213     History  No chief complaint on file.   Jennifer [name] is a 67 y.o. female.  Patient brought in by EMS with concerns for code stroke.  Patient according to family member developed left-sided weakness and speech problems right at 1130.  Patient was doing her home hemodialysis at that time.  Based on her medication list patient is not on any blood thinners.  Past medical history significant for previous strokes hypothyroidism hypertension coronary artery disease and high cholesterol.  Patient has dialysis catheter still in place in her right upper extremity.  Currently family member that was with her is her daughter and daughter is a Engineer, civil (consulting).  Patient evaluated at the bridge is looking around not able to speak patient significant weakness to left upper extremity and left foot.  Not able to raise left arm up at all.  Right arm able to raise able to wiggle right toes.  Patient former smoker quit in 2022.  Patient is had a hysterectomy.       Home Medications Prior to Admission medications   Medication Sig Start Date End Date Taking? Authorizing Provider  folic acid (FOLVITE) 1 MG tablet Take 1 tablet by mouth once daily 05/15/23   Micki Riley, MD  amLODipine (NORVASC) 10 MG tablet Take 1 tablet by mouth once daily Patient not taking: Reported on 04/19/2023 05/11/22   Mallipeddi, Vishnu P, MD  aspirin EC 81 MG tablet Take 1 tablet (81 mg total) by mouth 2 (two) times daily. Hold aspirin while you are on apixaban/Eliquis 04/06/19   Emokpae, Courage, MD  atorvastatin (LIPITOR) 80 MG tablet TAKE 1 TABLET BY MOUTH ONCE DAILY 6 IN THE EVENING 10/20/22   Mallipeddi, Vishnu P, MD  BD PEN NEEDLE NANO 2ND GEN 32G X 4 MM MISC Inject into the skin 3 (three) times daily. 07/22/21   [provider]  buPROPion (WELLBUTRIN SR) 150 MG 12 hr tablet Take 1 tablet by mouth  twice daily 10/19/22   [name], Audrie Gallus, MD  carvedilol (COREG) 12.5 MG tablet Take 1 tablet (12.5 mg total) by mouth 2 (two) times daily with a meal. 10/20/22   Mallipeddi, Vishnu P, MD  cholecalciferol (VITAMIN D3) 25 MCG (1000 UNIT) tablet Take 1,000 Units by mouth in the morning and at bedtime.    [provider]  DULoxetine (CYMBALTA) 60 MG capsule Take 1 capsule by mouth once daily 10/19/22   [name], Audrie Gallus, MD  ferrous sulfate 325 (65 FE) MG tablet Take 325 mg by mouth 2 (two) times daily with a meal.    [provider]  furosemide (LASIX) 80 MG tablet Take 80 mg by mouth 2 (two) times daily. 10/27/22   [provider]  insulin lispro protamine-lispro (HUMALOG 75/25 MIX) (75-25) 100 UNIT/ML SUSP injection Inject 10-25 Units into the skin See admin instructions. Inject 25 units with breakfast, 10 units at lunch, 25 units at dinner    [provider]  levothyroxine (SYNTHROID) 175 MCG tablet 1 tablet in the morning on an empty stomach Orally Once a day    [provider]  memantine (NAMENDA) 10 MG tablet Take 1 tablet (10 mg total) by mouth 2 (two) times daily. 02/22/23   Micki Riley, MD  nitroGLYCERIN (NITROSTAT) 0.4 MG SL tablet Place 0.4 mg under the tongue every 5 (five) minutes as needed for chest  pain.    [provider]  potassium chloride SA (KLOR-CON M) 20 MEQ tablet Take 20 mEq by mouth daily.    [provider]  sodium bicarbonate 650 MG tablet Take 1 tablet (650 mg total) by mouth 2 (two) times daily. 03/25/22   Almon Hercules, MD      Allergies    Metformin hcl    Review of Systems   Review of Systems  Unable to perform ROS: Patient nonverbal  Neurological:  Positive for speech difficulty and weakness.    Physical Exam Updated Vital Signs There were no vitals taken for this visit. Physical Exam Vitals and nursing note reviewed.  Constitutional:      General: She is not in acute distress.    Appearance: She is  well-developed.  HENT:     Head: Normocephalic and atraumatic.  Eyes:     Extraocular Movements: Extraocular movements intact.     Conjunctiva/sclera: Conjunctivae normal.     Pupils: Pupils are equal, round, and reactive to light.  Cardiovascular:     Rate and Rhythm: Normal rate and regular rhythm.     Heart sounds: No murmur heard. Pulmonary:     Effort: Pulmonary effort is normal. No respiratory distress.     Breath sounds: Normal breath sounds.  Abdominal:     Palpations: Abdomen is soft.     Tenderness: There is no abdominal tenderness.  Musculoskeletal:        General: No swelling.     Cervical back: Normal range of motion and neck supple.     Right lower leg: No edema.     Left lower leg: No edema.  Skin:    General: Skin is warm and dry.     Capillary Refill: Capillary refill takes less than 2 seconds.  Neurological:     Mental Status: She is alert.     Cranial Nerves: Cranial nerve deficit present.     Motor: Weakness present.  Psychiatric:        Mood and Affect: Mood normal.     ED Results / Procedures / Treatments   Labs (all labs ordered are listed, but only abnormal results are displayed) Labs Reviewed  CBG MONITORING, ED - Abnormal; Notable for the following components:      Result Value   Glucose-Capillary 183 (*)    All other components within normal limits  ETHANOL  PROTIME-INR  APTT  CBC  DIFFERENTIAL  COMPREHENSIVE METABOLIC PANEL  RAPID URINE DRUG SCREEN, HOSP PERFORMED  URINALYSIS, ROUTINE W REFLEX MICROSCOPIC  I-STAT CHEM 8, ED    EKG None  Radiology No results found.  Procedures Procedures    Medications Ordered in ED Medications - No data to display  ED Course/ Medical Decision Making/ A&P                                 Medical Decision Making Amount and/or Complexity of Data Reviewed Labs: ordered. Radiology: ordered.  Risk Prescription drug management. Decision regarding hospitalization.   CRITICAL  CARE Performed by: Vanetta Mulders Total critical care time: 60 minutes Critical care time was exclusive of separately billable procedures and treating other patients. Critical care was necessary to treat or prevent imminent or life-threatening deterioration. Critical care was time spent personally by me on the following activities: development of treatment plan with patient and/or surrogate as well as nursing, discussions with consultants, evaluation of patient's response to treatment,  examination of patient, obtaining history from patient or surrogate, ordering and performing treatments and interventions, ordering and review of laboratory studies, ordering and review of radiographic studies, pulse oximetry and re-evaluation of patient's condition.  Patient by history last known normal at 1130.  Then acute on sat of significant left upper extremity left lower extremity weakness and speech difficulties.  Chart review shows that patient has had multiple strokes in the past.  Patient apparently not on blood thinners.  But was receiving dialysis at home when this occurred.  Activated code stroke.  Head CT to my read without evidence of any significant head bleed.  Head CT without any acute findings.  Code stroke team gave her TNK.  Patient will be transferred to [facility].  When patient came out of the CT scan before she received TNK.  She was using her left arm some.   Final Clinical Impression(s) / ED Diagnoses Final diagnoses:  Cerebrovascular accident (CVA), unspecified mechanism [facility])    Rx / DC Orders ED Discharge Orders     None         Vanetta Mulders, MD 05/20/23 1229    Vanetta Mulders, MD 05/20/23 1317

## 2023-05-21 ENCOUNTER — Inpatient Hospital Stay (HOSPITAL_COMMUNITY): Payer: Medicare Other

## 2023-05-21 ENCOUNTER — Other Ambulatory Visit (HOSPITAL_COMMUNITY): Payer: Medicare Other

## 2023-05-21 DIAGNOSIS — I6389 Other cerebral infarction: Secondary | ICD-10-CM

## 2023-05-21 DIAGNOSIS — M79661 Pain in right lower leg: Secondary | ICD-10-CM | POA: Diagnosis not present

## 2023-05-21 DIAGNOSIS — I639 Cerebral infarction, unspecified: Secondary | ICD-10-CM | POA: Diagnosis not present

## 2023-05-21 DIAGNOSIS — N186 End stage renal disease: Secondary | ICD-10-CM | POA: Diagnosis not present

## 2023-05-21 DIAGNOSIS — T82818A Embolism of vascular prosthetic devices, implants and grafts, initial encounter: Secondary | ICD-10-CM | POA: Diagnosis not present

## 2023-05-21 DIAGNOSIS — Z992 Dependence on renal dialysis: Secondary | ICD-10-CM | POA: Diagnosis not present

## 2023-05-21 DIAGNOSIS — I63531 Cerebral infarction due to unspecified occlusion or stenosis of right posterior cerebral artery: Secondary | ICD-10-CM

## 2023-05-21 DIAGNOSIS — I613 Nontraumatic intracerebral hemorrhage in brain stem: Secondary | ICD-10-CM | POA: Diagnosis not present

## 2023-05-21 LAB — GLUCOSE, CAPILLARY
Glucose-Capillary: 109 mg/dL — ABNORMAL HIGH (ref 70–99)
Glucose-Capillary: 117 mg/dL — ABNORMAL HIGH (ref 70–99)
Glucose-Capillary: 121 mg/dL — ABNORMAL HIGH (ref 70–99)
Glucose-Capillary: 122 mg/dL — ABNORMAL HIGH (ref 70–99)
Glucose-Capillary: 136 mg/dL — ABNORMAL HIGH (ref 70–99)
Glucose-Capillary: 137 mg/dL — ABNORMAL HIGH (ref 70–99)

## 2023-05-21 LAB — PREPARE CRYOPRECIPITATE
Unit division: 0
Unit division: 0

## 2023-05-21 LAB — ECHOCARDIOGRAM COMPLETE
Area-P 1/2: 1.67 cm2
MV VTI: 2.99 cm2
S' Lateral: 3.2 cm
Weight: 3209.9 [oz_av]

## 2023-05-21 LAB — BPAM CRYOPRECIPITATE
Blood Product Expiration Date: 202412082359
Blood Product Expiration Date: 202412122359
ISSUE DATE / TIME: 202412071624
ISSUE DATE / TIME: 202412071700
Unit Type and Rh: 5100
Unit Type and Rh: 5100

## 2023-05-21 LAB — HEPATITIS B SURFACE ANTIGEN: Hepatitis B Surface Ag: NONREACTIVE

## 2023-05-21 MED ORDER — SODIUM CHLORIDE 0.9 % IV BOLUS
1000.0000 mL | Freq: Once | INTRAVENOUS | Status: AC
  Administered 2023-05-21: 1000 mL via INTRAVENOUS

## 2023-05-21 MED ORDER — CHLORHEXIDINE GLUCONATE CLOTH 2 % EX PADS
6.0000 | MEDICATED_PAD | Freq: Every day | CUTANEOUS | Status: DC
  Administered 2023-05-22 – 2023-05-24 (×3): 6 via TOPICAL

## 2023-05-21 MED ORDER — SODIUM CHLORIDE 0.9% FLUSH
3.0000 mL | Freq: Two times a day (BID) | INTRAVENOUS | Status: DC
Start: 2023-05-21 — End: 2023-05-24
  Administered 2023-05-21 – 2023-05-23 (×6): 3 mL via INTRAVENOUS

## 2023-05-21 MED ORDER — LORAZEPAM 2 MG/ML IJ SOLN
0.5000 mg | Freq: Once | INTRAMUSCULAR | Status: AC
  Administered 2023-05-21: 0.5 mg via INTRAVENOUS
  Filled 2023-05-21: qty 1

## 2023-05-21 MED ORDER — ORAL CARE MOUTH RINSE
15.0000 mL | OROMUCOSAL | Status: DC
  Administered 2023-05-21 – 2023-05-24 (×12): 15 mL via OROMUCOSAL

## 2023-05-21 MED ORDER — ARTIFICIAL TEARS OPHTHALMIC OINT
TOPICAL_OINTMENT | Freq: Every evening | OPHTHALMIC | Status: DC | PRN
  Filled 2023-05-21: qty 3.5

## 2023-05-21 MED ORDER — POLYVINYL ALCOHOL 1.4 % OP SOLN
1.0000 [drp] | OPHTHALMIC | Status: DC | PRN
  Filled 2023-05-21: qty 15

## 2023-05-21 MED ORDER — ORAL CARE MOUTH RINSE: 15.0000 mL | OROMUCOSAL | Status: DC | PRN

## 2023-05-21 NOTE — Evaluation (Signed)
Clinical/Bedside Swallow Evaluation Patient Details  Name: Jennifer [name] MRN: [id number] Date of Birth: 23-Jun-1955  Today's Date: 05/21/2023 Time: SLP Start Time (ACUTE ONLY): 1250 SLP Stop Time (ACUTE ONLY): 1307 SLP Time Calculation (min) (ACUTE ONLY): 17 min  Past Medical History:  Past Medical History:  Diagnosis Date   CAD (coronary artery disease)    cath 5/23 100% dist RCA lesion treated with 2 overlapping Integrity Resolute DES ( 2.25x66mm, 2.25x6mm), 60% mid RCA treated medically, 99% OM2 not amenable to PCI, 70% D1 lesion, EF normal   Hypercholesteremia    Hypertension    Hypothyroidism    Kidney disease    Renal disorder    Stroke (HCC) 10/2018   Thyroid disease    Type 2 diabetes mellitus (HCC) 10/08/2018   Past Surgical History:  Past Surgical History:  Procedure Laterality Date   ABDOMINAL HYSTERECTOMY     AV FISTULA PLACEMENT Right 06/28/2022   Procedure: RIGHT ARM ARTERIOVENOUS (AV) FISTULA CREATION;  Surgeon: Larina Earthly, MD;  Location: [facility];  Service: Vascular;  Laterality: Right;   CARDIAC CATHETERIZATION N/A 11/03/2015   Procedure: Left Heart Cath and Coronary Angiography;  Surgeon: Marykay Lex, MD;  Location: [facility] INVASIVE CV LAB;  Service: Cardiovascular;  Laterality: N/A;   CARDIAC CATHETERIZATION N/A 11/03/2015   Procedure: Coronary Stent Intervention;  Surgeon: Marykay Lex, MD;  Location: [facility] INVASIVE CV LAB;  Service: Cardiovascular;  Laterality: N/A;   CARPAL TUNNEL RELEASE     CORONARY ANGIOPLASTY     1 stent   FISTULA SUPERFICIALIZATION Right 08/16/2022   Procedure: RIGHT ARTERIOVENOUS FISTULA SUPERFICIALIZATION;  Surgeon: Larina Earthly, MD;  Location: [facility];  Service: Vascular;  Laterality: Right;   TOTAL KNEE ARTHROPLASTY Right 04/02/2019   Procedure: TOTAL KNEE ARTHROPLASTY;  Surgeon: Vickki Hearing, MD;  Location: [facility];  Service: Orthopedics;  Laterality: Right;   HPI:  Jennifer [name] is a 67 y.o. female  has a past medical history  of CAD (coronary artery disease), Hypercholesteremia, Hypertension, Hypothyroidism, Kidney disease, Renal disorder, Stroke (HCC) (10/2018), Thyroid disease, and Type 2 diabetes mellitus (HCC) (10/08/2018). who presents with acute onset complete expressive aphasia and worsening left-sided weakness s/p TNK at [facility], transferred to [facility] for further care.  Most recent CT head with acute hemorrhage dorsal pons/brachium pontis.    Assessment / Plan / Recommendation  Clinical Impression  Clinical swallowing evaluation was completed using thin liquids via spoon, cup and straw, pureed material and dual textured solids.  RN approved patient for PO intake.  Cranial nerve exam was completed and patient presented with dense right sided weakness that impacted both the upper and lower parts of her face.  She was unable to close her right eye despite effort.  She also complained of decreased sensation on the entire right side of her face.  There was concern for possible oropharyngeal dysphagia.  Anterior loss was seen particularly given thin liquids.  A/P transport appeared to be slow.  Swallow trigger was appreciated to palpation. While overt s/s of aspiration were not seen she seemed to have issues generating a swallow at times given a bolus. Patient endorsed feeling like she had a hard time swallowing at times.  Given clinical presentation and location of her bleed suggest she remain NPO except small sips of water only with good oral care and critical meds crushed in pureed material pending results of MBS to fully assess swallowing physiology and safety.  Diet recommendations pending results of that  evaluation. Cognitive/linguistic evaluation is also pending and will be addressed as time allows. SLP Visit Diagnosis: Dysphagia, unspecified (R13.10)    Aspiration Risk  Moderate aspiration risk    Diet Recommendation   NPO  Medication Administration: Crushed with puree    Other  Recommendations Oral Care  Recommendations: Oral care QID    Recommendations for follow up therapy are one component of a multi-disciplinary discharge planning process, led by the attending physician.  Recommendations may be updated based on patient status, additional functional criteria and insurance authorization.  Follow up Recommendations Other (comment) (TBD)         Functional Status Assessment Patient has had a recent decline in their functional status and demonstrates the ability to make significant improvements in function in a reasonable and predictable amount of time.    Swallow Study   General Date of Onset: 05/20/23 HPI: Jennifer [name] is a 67 y.o. female  has a past medical history of CAD (coronary artery disease), Hypercholesteremia, Hypertension, Hypothyroidism, Kidney disease, Renal disorder, Stroke (HCC) (10/2018), Thyroid disease, and Type 2 diabetes mellitus (HCC) (10/08/2018). who presents with acute onset complete expressive aphasia and worsening left-sided weakness s/p TNK at [facility], transferred to [facility] for further care.  Most recent CT head with acute hemorrhage dorsal pons/brachium pontis. Type of Study: Bedside Swallow Evaluation Previous Swallow Assessment: 10/09/2018 Rx'd regular/thin Diet Prior to this Study: NPO Temperature Spikes Noted: No Respiratory Status: Nasal cannula History of Recent Intubation: No Behavior/Cognition: Alert;Cooperative;Pleasant mood Oral Cavity Assessment: Within Functional Limits Oral Care Completed by SLP: No Oral Cavity - Dentition: Missing dentition Vision: Functional for self-feeding Self-Feeding Abilities: Able to feed self Patient Positioning: Upright in bed Baseline Vocal Quality: Normal Volitional Swallow: Able to elicit    Oral/Motor/Sensory Function Overall Oral Motor/Sensory Function: Severe impairment Facial ROM: Suspected CN VII (facial) dysfunction;Reduced right Facial Symmetry: Suspected CN VII (facial) dysfunction;Abnormal symmetry  right Facial Strength: Reduced right;Suspected CN VII (facial) dysfunction Facial Sensation: Reduced right;Suspected CN V (Trigeminal) dysfunction Lingual ROM: Reduced right;Suspected CN XII (hypoglossal) dysfunction Lingual Symmetry: Abnormal symmetry right;Suspected CN XII (hypoglossal) dysfunction Lingual Strength: Within Functional Limits Mandible: Impaired;Suspected CN V (Trigeminal) dysfunction   Ice Chips Ice chips: Not tested   Thin Liquid Thin Liquid: Impaired Presentation: Cup;Spoon;Straw;Self Fed Oral Phase Impairments: Reduced labial seal Oral Phase Functional Implications: Left anterior spillage;Right anterior spillage Pharyngeal  Phase Impairments: Suspected delayed Swallow    Nectar Thick Nectar Thick Liquid: Not tested   Honey Thick Honey Thick Liquid: Not tested   Puree Puree: Impaired Presentation: Spoon Oral Phase Impairments: Reduced labial seal;Reduced lingual movement/coordination Oral Phase Functional Implications: Prolonged oral transit Pharyngeal Phase Impairments: Suspected delayed Swallow   Solid     Solid: Impaired Presentation: Spoon Oral Phase Impairments: Reduced labial seal;Reduced lingual movement/coordination Oral Phase Functional Implications: Oral residue;Impaired mastication;Prolonged oral transit Pharyngeal Phase Impairments: Suspected delayed Swallow     Dimas Aguas, MA, CCC-SLP Acute Rehab SLP [phone]  Fleet Contras 05/21/2023,1:32 PM

## 2023-05-21 NOTE — Progress Notes (Signed)
Inpatient Rehab Admissions Coordinator:  ? ?Per therapy recommendations,  patient was screened for CIR candidacy by Devaney Segers, MS, CCC-SLP. At this time, Pt. Appears to be a a potential candidate for CIR. I will place   order for rehab consult per protocol for full assessment. Please contact me any with questions. ? ?Trine Fread, MS, CCC-SLP ?Rehab Admissions Coordinator  ?[phone] (celll) ?[phone] (office) ? ?

## 2023-05-21 NOTE — Progress Notes (Addendum)
  Daily Progress Note  S/p:n/a  Subjective: Comfortable in bed this morning, responded appropriately.  Family member at bedside Ace wrap taken down this morning and exit site dressing was removed  Objective: Vitals:   05/21/23 0700 05/21/23 0800  BP: (!) 154/57 (!) 146/59  Pulse: 85 81  Resp: 13 19  Temp:    SpO2: 100% 98%    Physical Examination  Alert Hemodynamically stable Right upper extremity fistula with palpable thrill, hematoma much softer. 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 CTA neck negative for any significant carotid stenosis  The Ace wrap and access site dressings were removed this morning.  There is a palpable thrill in the fistula centrally there are areas where fistula can be accessed.  Please reach back out to vascular surgery dialysis is unsuccessful tomorrow   Daria Pastures MD MS [facility] [phone] 05/21/2023  8:49 AM

## 2023-05-21 NOTE — Progress Notes (Signed)
[facility] Progress Note   Subjective:    Seen and examined at bedside. Patient's daughter also at bedside. Currently working with OT at bedside. Next HD 05/22/23 at bedside. Plan to do HD MWF schedule while inpatient.  Objective Vitals:   05/21/23 0900 05/21/23 1000 05/21/23 1100 05/21/23 1200  BP: (!) 152/55 (!) 148/61 (!) 154/57 (!) 145/69  Pulse: 83 78 80 80  Resp: 13 12 14 15   Temp:    98 F (36.7 C)  TempSrc:    Oral  SpO2: 100% 100% 99% 96%  Weight:       Physical Exam General: Awake, chronically-ill appearing, on O2, NAD Neuro: R-sided facial droop Heart: S1 and S2; No MRGs Lungs: Clear anteriorly Abdomen: Soft and non-tender Extremities: 1+ bilateral ankle edema Dialysis Access: R AVF (+) B/T, bruising and swelling improving, ace wrap now off  Filed Weights   05/20/23 1240  Weight: 91 kg    Intake/Output Summary (Last 24 hours) at 05/21/2023 1335 Last data filed at 05/21/2023 0300 Gross per 24 hour  Intake 1287.62 ml  Output --  Net 1287.62 ml    Additional Objective Labs: Basic Metabolic Panel: Recent Labs  Lab 05/20/23 1222  NA 136  K 3.5  CL 96*  CO2 24  GLUCOSE 234*  BUN 43*  CREATININE 4.03*  CALCIUM 8.8*   Liver Function Tests: Recent Labs  Lab 05/20/23 1222  AST 21  ALT 19  ALKPHOS 53  BILITOT 0.6  PROT 6.1*  ALBUMIN 2.9*   No results for input(s): "LIPASE", "AMYLASE" in the last 168 hours. CBC: Recent Labs  Lab 05/20/23 1222  WBC 5.8  NEUTROABS 4.2  HGB 11.1*  HCT 36.1  MCV 91.9  PLT 186   Blood Culture    Component Value Date/Time   SDES  03/18/2022 1543    URINE, CATHETERIZED Performed at [facility], [address] [place], [place] [address]    [facility]  03/18/2022 1543    NONE Performed at [facility], [address] [place], [place] [address]    CULT >=100,000 COLONIES/mL ESCHERICHIA COLI (A) 03/18/2022 1543   REPTSTATUS 03/21/2022 FINAL 03/18/2022 1543    Cardiac Enzymes: No  results for input(s): "CKTOTAL", "CKMB", "CKMBINDEX", "TROPONINI" in the last 168 hours. CBG: Recent Labs  Lab 05/20/23 1946 05/20/23 2319 05/21/23 0318 05/21/23 0749 05/21/23 1135  GLUCAP 203* 151* 117* 137* 122*   Iron Studies: No results for input(s): "IRON", "TIBC", "TRANSFERRIN", "FERRITIN" in the last 72 hours. Lab Results  Component Value Date   INR 1.1 05/20/2023   INR 0.99 11/14/2017   INR 1.68 (H) 11/04/2015   Studies/Results: VAS [place] LOWER EXTREMITY VENOUS (DVT)  Result Date: 05/21/2023  Lower Venous DVT Study Patient Name:  Jennifer [name]  Date of Exam:   05/21/2023 Medical Rec #: [id number]      Accession #:    [id number] Date of Birth: 06/24/1955     Patient Gender: F Patient Age:   67 years Exam Location:  [facility] Procedure:      VAS [place] LOWER EXTREMITY VENOUS (DVT) Referring Phys: Scheryl Marten XU --------------------------------------------------------------------------------  Indications: Pain.  Risk Factors: None identified. Limitations: Poor ultrasound/tissue interface. Comparison Study: No prior studies. Performing Technologist: Chanda Busing RVT  Examination Guidelines: A complete evaluation includes B-mode imaging, spectral Doppler, color Doppler, and power Doppler as needed of all accessible portions of each vessel. Bilateral testing is considered an integral part of a complete examination. Limited examinations for reoccurring indications may  be performed as noted. The reflux portion of the exam is performed with the patient in reverse Trendelenburg.  +---------+---------------+---------+-----------+----------+--------------+ RIGHT    CompressibilityPhasicitySpontaneityPropertiesThrombus Aging +---------+---------------+---------+-----------+----------+--------------+ CFV      Full           Yes      Yes                                 +---------+---------------+---------+-----------+----------+--------------+ SFJ      Full                                                         +---------+---------------+---------+-----------+----------+--------------+ FV Prox  Full                                                        +---------+---------------+---------+-----------+----------+--------------+ FV Mid   Full                                                        +---------+---------------+---------+-----------+----------+--------------+ FV DistalFull                                                        +---------+---------------+---------+-----------+----------+--------------+ PFV      Full                                                        +---------+---------------+---------+-----------+----------+--------------+ POP      Full           Yes      Yes                                 +---------+---------------+---------+-----------+----------+--------------+ PTV      Full                                                        +---------+---------------+---------+-----------+----------+--------------+ PERO     Full                                                        +---------+---------------+---------+-----------+----------+--------------+   +---------+---------------+---------+-----------+----------+-------------------+ LEFT     CompressibilityPhasicitySpontaneityPropertiesThrombus Aging      +---------+---------------+---------+-----------+----------+-------------------+ CFV      Full  Yes      Yes                                      +---------+---------------+---------+-----------+----------+-------------------+ SFJ      Full                                                             +---------+---------------+---------+-----------+----------+-------------------+ FV Prox  Full                                                             +---------+---------------+---------+-----------+----------+-------------------+ FV Mid   Full                                                              +---------+---------------+---------+-----------+----------+-------------------+ FV DistalFull                                                             +---------+---------------+---------+-----------+----------+-------------------+ PFV      Full                                                             +---------+---------------+---------+-----------+----------+-------------------+ POP      Full           Yes      Yes                                      +---------+---------------+---------+-----------+----------+-------------------+ PTV      Full                                                             +---------+---------------+---------+-----------+----------+-------------------+ PERO                                                  Not well visualized +---------+---------------+---------+-----------+----------+-------------------+    Summary: RIGHT: - There is no evidence of deep vein thrombosis in the lower extremity.  - No cystic structure found in the popliteal fossa.  LEFT: - There is no evidence of deep vein thrombosis in  the lower extremity. However, portions of this examination were limited- see technologist comments above.  - No cystic structure found in the popliteal fossa.  *See table(s) above for measurements and observations.    Preliminary    MR BRAIN WO CONTRAST  Result Date: 05/21/2023 CLINICAL DATA:  67 year old female code stroke presentation on 05/20/2023 with small right brainstem hemorrhage. EXAM: MRI HEAD WITHOUT CONTRAST TECHNIQUE: Multiplanar, multiecho pulse sequences of the brain and surrounding structures were obtained without intravenous contrast. COMPARISON:  Head CTs 05/20/2023 and earlier. Brain MRI 12/01/2022 and earlier. FINDINGS: Brain: Chronic very severe small-vessel disease signal changes in the deep white matter, deep gray matter, brainstem, cerebellum. Scattered chronic microhemorrhages and  chronic transependymal hemosiderin bilaterally. Extensive brainstem chronic hemorrhages on SWI. Deep cerebellar chronic microhemorrhages also. The above findings are chronic but mildly progressed since the June MRI. Superimposed small acute dorsal right brainstem hemorrhage is most apparent as DWI artifact (series 2, image 16), hypointense on T1 and 4-5 mm (series 8, image 14). No significant posterior fossa mass effect. Superimposed punctate posterosuperior left frontal lobe restricted diffusion in the subcortical white matter of the perirolandic area series 2, image 38. No other convincing restricted diffusion. No superimposed midline shift, evidence of mass lesion, ventriculomegaly. Cervicomedullary junction and pituitary are within normal limits. Vascular: Major intracranial vascular flow voids are preserved. Skull and upper cervical spine: Stable, negative. Sinuses/Orbits: Stable, negative. Other: Mastoids are well aerated. Grossly normal visible internal auditory structures. Negative visible scalp and face. IMPRESSION: 1. Punctate posterior Left MCA territory subcortical white matter infarct. No mass or hemorrhage there. 2. Subcentimeter acute right dorsal brainstem hemorrhage is stable since yesterday. No mass effect. 3. Underlying very severe underlying chronic small vessel disease including extensive chronic microhemorrhages and small vessel infarcts throughout the brain - mildly progressed from a June MRI. Electronically Signed   By: Odessa Fleming M.D.   On: 05/21/2023 09:36   CT HEAD WO CONTRAST ( )  Result Date: 05/20/2023 CLINICAL DATA:  Hemorrhage follow-up EXAM: CT HEAD WITHOUT CONTRAST TECHNIQUE: Contiguous axial images were obtained from the base of the skull through the vertex without intravenous contrast. RADIATION DOSE REDUCTION: This exam was performed according to the departmental dose-optimization program which includes automated exposure control, adjustment of the mA and/or kV according to  patient size and/or use of iterative reconstruction technique. COMPARISON:  05/20/2023 at 3:48 p.m. FINDINGS: Brain: Unchanged appearance of small intraparenchymal hematoma at the right middle cerebellar peduncle. Chronic white matter hypoattenuation. Old left cerebellar infarct. No extra-axial collection. No mass effect or hydrocephalus. Vascular: There is atherosclerotic calcification of both internal carotid arteries at the skull base. Skull: Normal Sinuses/Orbits: Negative Other: None IMPRESSION: Unchanged appearance of small intraparenchymal hematoma at the right middle cerebellar peduncle. Electronically Signed   By: Deatra Robinson M.D.   On: 05/20/2023 21:07   DG CHEST PORT 1 VIEW  Result Date: 05/20/2023 CLINICAL DATA:  Hypoxia, nausea and vomiting EXAM: PORTABLE CHEST 1 VIEW COMPARISON:  01/23/2022 FINDINGS: Single frontal view of the chest demonstrates an unremarkable cardiac silhouette. No airspace disease, effusion, or pneumothorax. No acute bony abnormalities. IMPRESSION: 1. No acute intrathoracic process. Electronically Signed   By: Sharlet Salina M.D.   On: 05/20/2023 17:25   CT HEAD WO CONTRAST ( )  Result Date: 05/20/2023 CLINICAL DATA:  Neuro deficit, acute, stroke suspected EXAM: CT HEAD WITHOUT CONTRAST TECHNIQUE: Contiguous axial images were obtained from the base of the skull through the vertex without intravenous contrast. RADIATION DOSE REDUCTION: This exam was  performed according to the departmental dose-optimization program which includes automated exposure control, adjustment of the mA and/or kV according to patient size and/or use of iterative reconstruction technique. COMPARISON:  Same day CT head FINDINGS: Brain: There is a 7 x 8 mm site acute hemorrhage in the right dorsal pons/brachium pontis (series 2, image 8). Sequela of mild chronic microvascular ischemic change. Chronic infarcts in the right basal ganglia and left cerebellum. No significant mass effect. No mass lesion.  Vascular: No hyperdense vessel or unexpected calcification. Skull: Normal. Negative for fracture or focal lesion. Sinuses/Orbits: No middle ear or mastoid effusion. Paranasal sinuses are clear. Bilateral lens. Orbits are otherwise unremarkable. Other: None. IMPRESSION: Acute hemorrhage in the right dorsal pons/brachium pontis measuring 7 x 8 mm. No significant mass effect. Findings were paged to Dr. Selina Cooley on 05/20/23 at 4:21 PM. Electronically Signed   By: Lorenza Cambridge M.D.   On: 05/20/2023 16:21   CT ANGIO HEAD NECK W WO CM (CODE STROKE)  Result Date: 05/20/2023 CLINICAL DATA:  67 year old female code stroke presentation with left side weakness, facial droop, aphasia. EXAM: CT ANGIOGRAPHY HEAD AND NECK TECHNIQUE: Multidetector CT imaging of the head and neck was performed using the standard protocol during bolus administration of intravenous contrast. Multiplanar CT image reconstructions and MIPs were obtained to evaluate the vascular anatomy. Carotid stenosis measurements (when applicable) are obtained utilizing NASCET criteria, using the distal internal carotid diameter as the denominator. RADIATION DOSE REDUCTION: This exam was performed according to the departmental dose-optimization program which includes automated exposure control, adjustment of the mA and/or kV according to patient size and/or use of iterative reconstruction technique. CONTRAST:  75mL OMNIPAQUE IOHEXOL 350 MG/ML SOLN COMPARISON:  Plain head CT 1223 hours today. Prior CTA head and neck 10/08/2018. FINDINGS: CTA NECK Skeleton: Largely absent dentition. Mild for age cervical spine degeneration. No acute osseous abnormality identified. Upper chest: Negative. Other neck: Small volume retained secretions in the hypopharynx. Otherwise negative. Aortic arch: 3 vessel arch. Chronic Calcified aortic atherosclerosis. Right carotid system: Brachiocephalic artery plaque without stenosis. Negative right CCA. Mild calcified plaque at the right carotid  bifurcation more affecting the ECA. No stenosis. Left carotid system: Mild left CCA origin calcified plaque without stenosis. Mild calcified plaque at the left carotid bifurcation. Mild tortuosity. No stenosis. Vertebral arteries: Mild right subclavian origin calcified plaque without stenosis. Minimal plaque at the right vertebral artery origin without stenosis. Tortuous right V1 segment. Right vertebral patent to the skull base without stenosis. Proximal left subclavian artery soft and calcified plaque without stenosis. Left vertebral artery origin remains normal. Tortuous left V1 segment. Codominant left vertebral artery is mildly tortuous to the skull base with no plaque or stenosis. CTA HEAD Posterior circulation: Distal vertebral arteries, vertebrobasilar junction and PICA origins appear patent and normal. No basilar artery stenosis. Patent AICA, SCA, PCA origins. Bilateral PCA branches remain patent. Left P2 segment moderate irregularity and stenosis on series 12, image 22 appears increased. Chronic right P2 segment irregularity and stenosis is mild to moderate and stable. Anterior circulation: Both ICA siphons are patent but heavily calcified. On the left moderate cavernous and anterior genu stenosis appears progressed since 2020. On the right moderate to severe cavernous segment (series 6, image 246) and anterior genu stenosis also appears progressed. Patent carotid termini. Normal MCA and ACA origins. Normal anterior communicating artery. Small median artery of the corpus callosum. Bilateral ACA branches are stable and within normal limits. Left MCA M1 segment and bifurcation are patent without stenosis. Right  MCA M1 segment and bifurcation are patent without stenosis. Bilateral MCA branches appear stable since 2020. Mild if any branch irregularity. Venous sinuses: Patent. Anatomic variants: None significant. Review of the MIP images confirms the above findings IMPRESSION: 1. Negative for large vessel  occlusion. Preliminary report of this finding communicated to Dr. Bing Neighbors at 1247 hours on 05/20/2023 by text page via the [facility] messaging system. 2. Positive for chronic ICA siphon calcified plaque where stenosis appears progressed from a 2020 CTA, up to Moderate on the Left and Severe on the Right. Moderate Left PCA P2 segment stenosis also appears increased. Mild for age extracranial atherosclerosis without stenosis. 3.  Aortic Atherosclerosis (ICD10-I70.0). Electronically Signed   By: Odessa Fleming M.D.   On: 05/20/2023 12:55   CT HEAD CODE STROKE WO CONTRAST  Result Date: 05/20/2023 CLINICAL DATA:  Code stroke.  Neuro deficit, acute, stroke suspected EXAM: CT HEAD WITHOUT CONTRAST TECHNIQUE: Contiguous axial images were obtained from the base of the skull through the vertex without intravenous contrast. RADIATION DOSE REDUCTION: This exam was performed according to the departmental dose-optimization program which includes automated exposure control, adjustment of the mA and/or kV according to patient size and/or use of iterative reconstruction technique. COMPARISON:  Brain MR 07/02/22 FINDINGS: Brain: No hemorrhage. No hydrocephalus. No extra-axial fluid collection. There is sequela of severe chronic microvascular ischemic change. Chronic infarcts in the right basal ganglia and left cerebellum. Vascular: No hyperdense vessel or unexpected calcification. Skull: Normal. Negative for fracture or focal lesion. Sinuses/Orbits: No middle ear or mastoid effusion. Paranasal sinuses are clear. Bilateral lens replacement. Orbits are otherwise unremarkable. Other: None. ASPECTS ([place] Stroke Program Early CT Score): 10 IMPRESSION: 1. No hemorrhage or CT evidence of an acute cortical infarct. Aspects is 10. 2. Sequela of severe chronic microvascular ischemic change. Chronic infarcts in the right basal ganglia and left cerebellum. Findings were discussed with Dr. Deretha Emory on 05/20/23 at 12:38 PM Electronically Signed    By: Lorenza Cambridge M.D.   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 TNK reversed. Neurology following and on neuro ICU.  ESRD -  on HHD. No urgent need for dialysis at admit. Next HD Monday 12/9. No heparin during treatments. Hypertension/volume  - Not grossly overloaded on exam. On IV Cleviprex and IV PRN Hydralazine. Noted she's on Coreg 12.5mg  BID in outpatient. Midodrine held for now. Anemia of CKD - Hgb 11.1. No Fe/ESA indicated at this time Secondary Hyperparathyroidism - Ca ok. Check phos in AM Nutrition - NPO. Changed to renal diet with fluid restrictions when medically stable.  Salome Holmes, NP [facility] 05/21/2023,1:35 PM  LOS: 1 day

## 2023-05-21 NOTE — Progress Notes (Signed)
Bilateral lower extremity venous duplex has been completed. Preliminary results can be found in CV Proc through chart review.   05/21/23 10:43 AM Olen Cordial RVT

## 2023-05-21 NOTE — Evaluation (Addendum)
Physical Therapy Evaluation Patient Details Name: Jennifer [name] MRN: [id number] DOB: 11-26-1955 Today's Date: 05/21/2023  History of Present Illness  Pt is a 67 y.o. female who presented 05/20/23 with aphasia and worsening L-sided weakness (mild residual weakness from prior CVA baseline). Head CT showed no acute process. TNK administered. Later, the pt's facial droop worsened and pt began to have nausea and vomiting. Repeat head CT showed acute hemorrhage in the right dorsal pons/brachium pontis. 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 PTA, she was residing with her husband and daughter in a 1-level house with a ramped entrance option. As of the last month, pt has been using her w/c for mobility and needed assistance for stand pivot transfers to/from it. This is due to pt becoming very fearful of falling as she has had many falls in the past per family. Prior to the last month, pt was mod I with RW for household distance mobility. Per family, pt has a hx of bil leg weakness with R being worse than L since R TKA. The weakness on the L at baseline was due to a prior CVA. Currently, pt displays bil leg weakness (R weaker than L, likely worse than usual) and deficits in balance, power, activity tolerance, and cognition. She displays a L lateral lean and needs repeated cues to find and maintain midline. At this time, pt is requiring modAx1-2 for bed mobility and modA to transfer to stand and maxA to take a couple steps along EOB with HHA. Pt has good family support, likely could tolerate the x3 hours of therapy per day, is motivated to improve and reduce risk for falls, has had a functional decline, and is at high risk for falls. Thus, feel she could greatly benefit from intensive inpatient rehab, > 3 hours/day. Will  continue to follow acutely.  Of note, pt denied dizziness but nystagmus evident and pt did attempt bouts of emesis this session      If plan is discharge home, recommend the following: Two people to help with walking and/or transfers;A lot of help with bathing/dressing/bathroom;Assistance with cooking/housework;Direct supervision/assist for financial management;Direct supervision/assist for medications management;Assist for transportation;Help with stairs or ramp for entrance   Can travel by private vehicle        Equipment Recommendations None recommended by PT  Recommendations for Other Services  Rehab consult    Functional Status Assessment Patient has had a recent decline in their functional status and demonstrates the ability to make significant improvements in function in a reasonable and predictable amount of time.     Precautions / Restrictions Precautions Precautions: Fall Precaution Comments: Goal SBP < 160 Restrictions Weight Bearing Restrictions: No      Mobility  Bed Mobility Overal bed mobility: Needs Assistance Bed Mobility: Supine to Sit, Sit to Supine     Supine to sit: HOB elevated, Mod assist Sit to supine: Mod assist, +2 for physical assistance, +2 for safety/equipment   General bed mobility comments: Pt initiated bringing legs off EOB and ascending trunk well but needed modA to finish leaning to the R to ascend her trunk to sit L EOB as pt had a L lean today. ModAx2 to lift legs and direct trunk to supine    Transfers Overall transfer level: Needs assistance Equipment used: 1 person hand held assist Transfers: Sit to/from  Stand Sit to Stand: Mod assist           General transfer comment: Bil knees blocked and pt holding onto therapist anterior to her to pull up to stand, modA to power up and gain balance, cuing pt to lean to her R    Ambulation/Gait Ambulation/Gait assistance: Max assist Gait Distance (Feet): 2 Feet Assistive device: 1  person hand held assist Gait Pattern/deviations: Step-to pattern, Decreased step length - right, Decreased step length - left, Decreased stride length, Decreased weight shift to right, Decreased weight shift to left, Trunk flexed Gait velocity: reduced Gait velocity interpretation: <1.31 ft/sec, indicative of household ambulator   General Gait Details: Pt needed max multi-modal cues to shift weight to advance feet to step to L along EOB. Increased difficulty stepping with L than R due to needing increased assist to shift weight off L leg to her R.  Stairs            Wheelchair Mobility     Tilt Bed    Modified Rankin (Stroke Patients Only) Modified Rankin (Stroke Patients Only) Pre-Morbid Rankin Score: Moderately severe disability Modified Rankin: Moderately severe disability     Balance Overall balance assessment: Needs assistance Sitting-balance support: Single extremity supported, Feet supported, Bilateral upper extremity supported Sitting balance-Leahy Scale: Poor Sitting balance - Comments: L lateral lean, needing min-modA for static sitting balance Postural control: Left lateral lean Standing balance support: Bilateral upper extremity supported, During functional activity Standing balance-Leahy Scale: Poor Standing balance comment: L lean, modA for static standing balance, maxA to step                             Pertinent Vitals/Pain Pain Assessment Pain Assessment: Faces Faces Pain Scale: Hurts a little bit Pain Location: grimacing with attempts at bouts of emesis Pain Descriptors / Indicators: Grimacing Pain Intervention(s): Limited activity within patient's tolerance, Monitored during session    Home Living Family/patient expects to be discharged to:: Private residence Living Arrangements: Spouse/significant other;Children (daughter) Available Help at Discharge: Family;Available 24 hours/day Type of Home: House Home Access: Stairs to  enter;Ramped entrance Entrance Stairs-Rails: Can reach both Entrance Stairs-Number of Steps: 5-6   Home Layout: One level Home Equipment: Agricultural consultant (2 wheels);Cane - single point;BSC/3in1;Shower seat;Hospital bed;Wheelchair - manual;Grab bars - tub/shower      Prior Function Prior Level of Function : Needs assist             Mobility Comments: been using w/c for past month due to fear of faling and been needing assist for stand pivot transfers; mod I with RW for household distance mobility prior to that; hx of frequent falls ADLs Comments: assist for all ADLs but pt able to participate in them as well     Extremity/Trunk Assessment   Upper Extremity Assessment Upper Extremity Assessment: Defer to OT evaluation    Lower Extremity Assessment Lower Extremity Assessment: RLE deficits/detail;LLE deficits/detail RLE Deficits / Details: R leg weaker than L with R MMT scores of 3+ to 4- grossly, some baseline weakness since TKA (R weaker than L at baseline); hx of peripheral neuropathy bil, pt denies any acute sensation changes RLE Sensation: history of peripheral neuropathy LLE Deficits / Details: mild residual L weakness from prior CVA, but L was stronger than R at baseline; MMT scores of grossly 4; hx of peripheral neuropathy bil, pt denies any acute sensation changes LLE Sensation: history of peripheral neuropathy  Cervical / Trunk Assessment Cervical / Trunk Assessment: Normal  Communication   Communication Communication: No apparent difficulties  Cognition Arousal: Alert Behavior During Therapy: WFL for tasks assessed/performed Overall Cognitive Status: Impaired/Different from baseline Area of Impairment: Attention, Following commands, Safety/judgement, Awareness, Problem solving                   Current Attention Level: Selective   Following Commands: Follows one step commands consistently, Follows one step commands with increased time, Follows multi-step  commands inconsistently Safety/Judgement: Decreased awareness of safety, Decreased awareness of deficits Awareness: Emergent Problem Solving: Slow processing, Difficulty sequencing, Requires verbal cues, Requires tactile cues General Comments: Pt unaware of her L lateral lean, needing repeated cues to correct and maintain midline. Needed multi-modal cues to sequence taking steps along EOB. Pt unaware of her dizziness, even denying it while attempting to have bouts of emesis, so decreased insight into her deficits        General Comments General comments (skin integrity, edema, etc.): VSS on RA    Exercises     Assessment/Plan    PT Assessment Patient needs continued PT services  PT Problem List Decreased strength;Decreased activity tolerance;Decreased balance;Decreased mobility;Decreased cognition;Decreased coordination;Decreased safety awareness       PT Treatment Interventions DME instruction;Gait training;Functional mobility training;Therapeutic activities;Therapeutic exercise;Balance training;Neuromuscular re-education;Cognitive remediation;Wheelchair mobility training;Patient/family education    PT Goals (Current goals can be found in the Care Plan section)  Acute Rehab PT Goals Patient Stated Goal: to improve PT Goal Formulation: With patient/family Time For Goal Achievement: 06/04/23 Potential to Achieve Goals: Good    Frequency Min 1X/week     Co-evaluation               AM-PAC PT "6 Clicks" Mobility  Outcome Measure Help needed turning from your back to your side while in a flat bed without using bedrails?: A Little Help needed moving from lying on your back to sitting on the side of a flat bed without using bedrails?: A Lot Help needed moving to and from a bed to a chair (including a wheelchair)?: A Lot Help needed standing up from a chair using your arms (e.g., wheelchair or bedside chair)?: A Lot Help needed to walk in hospital room?: Total Help needed  climbing 3-5 steps with a railing? : Total 6 Click Score: 11    End of Session Equipment Utilized During Treatment: Gait belt Activity Tolerance: Patient tolerated treatment well Patient left: in bed;with call bell/phone within reach;with nursing/sitter in room;with family/visitor present Nurse Communication: Mobility status PT Visit Diagnosis: Unsteadiness on feet (R26.81);Other abnormalities of gait and mobility (R26.89);Difficulty in walking, not elsewhere classified (R26.2);Other symptoms and signs involving the nervous system (R29.898);Dizziness and giddiness (R42);History of falling (Z91.81);Muscle weakness (generalized) (M62.81);Repeated falls (R29.6)    Time: 4854-6270 PT Time Calculation (min) (ACUTE ONLY): 21 min   Charges:   PT Evaluation $PT Eval Moderate Complexity: 1 Mod   PT General Charges $$ ACUTE PT VISIT: 1 Visit         Virgil Benedict, PT, DPT Acute Rehabilitation Services  Office: [phone]   Bettina Gavia 05/21/2023, 4:34 PM

## 2023-05-21 NOTE — Progress Notes (Signed)
NAME:  Jennifer [name], MRN:  [id number], DOB:  1956-05-12, LOS: 1 ADMISSION DATE:  05/20/2023, CONSULTATION DATE:  05/20/2023 REFERRING MD: Stroke team, CHIEF COMPLAINT: Assistance with medical management  History of Present Illness:  Jennifer [name] is a 67 year old female with past medical history significant for ESRD on HD, HTN, HLD, type 2 diabetes, and CAD who presented to the ED 12/7 at  [facility] with expressive aphasia and worsening left-sided weakness.  C head CT showed no acute processes and patient was administered TNK.  CTA negative for LVO  On arrival to ICU patient had worsening of facial droop and became nauseous and vomited x 1 stat head CT repeated and showed acute hemorrhage in the right dorsal pons/brachium pontis measuring 7 x 8, no significant mass effect.  Given acute change with now conversion to hemorrhagic stroke PCCM consulted for assistance in management  Pertinent  Medical History  ESRD on HD, HTN, HLD, type 2 diabetes, and CAD   Significant Hospital Events: Including procedures, antibiotic start and stop dates in addition to other pertinent events   12/7 presented with expressive aphasia and worsening left-sided weakness code stroke CT negative patient received TNK.  Acute neurochange on arrival to ICU prompted repeat head CT which revealed acute hemorrhage  Interim History / Subjective:   Stable overnight, no respiratory issues Denies arm pain  Objective   Blood pressure (!) 152/55, pulse 83, temperature 98 F (36.7 C), temperature source Oral, resp. rate 13, weight 91 kg, SpO2 100%.        Intake/Output Summary (Last 24 hours) at 05/21/2023 0935 Last data filed at 05/21/2023 0300 Gross per 24 hour  Intake 1287.62 ml  Output --  Net 1287.62 ml   Filed Weights   05/20/23 1240  Weight: 91 kg    Examination: General: Chronic ill-appearing deconditioned middle-aged female sitting up in bed in no acute distress HEENT: [place]/AT, MM pink/moist, PERRL,   Neuro: Alert and oriented x 3, right facial weakness , weak grip on right CV: s1s2 regular rate and rhythm, no murmur, rubs, or gallops,  PULM: Clear to auscultation bilaterally, no accessory muscle use GI: soft, bowel sounds active in all 4 quadrants, non-tender, non-distended Extremities: Right arm decreased swelling, some ecchymosis, good thrill in AV fistula, moving fingers well Skin: no rashes or lesions  Resolved Hospital Problem list     Assessment & Plan:  Acute ischemic infarction s/p TNK with brainstem hemorrhage -Presented with expressive aphasia and worsening left-sided weakness code stroke CT negative patient received TNK.  Acute neurochange on arrival to ICU prompted repeat head CT which revealed acute hemorrhage in the right dorsal pons/brachium pontis.  TNK reversed P: Primary management per neurology Frequent neurochecks  Secondary stroke prevention's SBP goal per neurology PT/OT/SLP as able  At risk for respiratory decompensation -Patient vomited once with no signs of aspiration. Able to protect airway P: Aspiration precautions Advance p.o.  RUE hematoma -much improved, vascular following  Hypertension P: Continuous telemetry SBP goal per neurology Continue Cleviprex As needed IV hydralazine  Type 2 diabetes P: SSI CBG goal 140-180 CBG checks every 4  End-stage renal disease on IHD T/T/S P: Nephrology consulted, appreciate assistance IHD per nephrology, AVF appears okay  PCCM available as needed   Best Practice (right click and "Reselect all SmartList Selections" daily)   Diet/type: NPO DVT prophylaxis: SCD's Start: 05/20/23 1611 Pressure ulcer(s): not present on admission  GI prophylaxis: PPI Lines: N/A Foley:  N/A Code Status:  full code  Last date of multidisciplinary goals of care discussion: Pending   Labs   CBC: Recent Labs  Lab 05/20/23 1222  WBC 5.8  NEUTROABS 4.2  HGB 11.1*  HCT 36.1  MCV 91.9  PLT 186    Basic  Metabolic Panel: Recent Labs  Lab 05/20/23 1222  NA 136  K 3.5  CL 96*  CO2 24  GLUCOSE 234*  BUN 43*  CREATININE 4.03*  CALCIUM 8.8*   GFR: Estimated Creatinine Clearance: 15.2 mL/min (A) (by C-G formula based on SCr of 4.03 mg/dL (H)). Recent Labs  Lab 05/20/23 1222  WBC 5.8    Liver Function Tests: Recent Labs  Lab 05/20/23 1222  AST 21  ALT 19  ALKPHOS 53  BILITOT 0.6  PROT 6.1*  ALBUMIN 2.9*   No results for input(s): "LIPASE", "AMYLASE" in the last 168 hours. No results for input(s): "AMMONIA" in the last 168 hours.  ABG    Component Value Date/Time   HCO3 23.2 03/22/2022 1657   TCO2 17 (L) 08/16/2022 0852   ACIDBASEDEF 2.5 (H) 03/22/2022 1657   O2SAT 36.9 03/22/2022 1657     Coagulation Profile: Recent Labs  Lab 05/20/23 1222  INR 1.1    Cardiac Enzymes: No results for input(s): "CKTOTAL", "CKMB", "CKMBINDEX", "TROPONINI" in the last 168 hours.  HbA1C: Hemoglobin A1C  Date/Time Value Ref Range Status  03/20/2017 12:00 AM 7.0  Final   Hgb A1c MFr Bld  Date/Time Value Ref Range Status  05/20/2023 05:14 PM 6.2 (H) 4.8 - 5.6 % Final    Comment:    (NOTE) Pre diabetes:          5.7%-6.4%  Diabetes:              >6.4%  Glycemic control for   <7.0% adults with diabetes   03/25/2022 03:05 PM 7.1 (H) 4.8 - 5.6 % Final    Comment:    (NOTE) Pre diabetes:          5.7%-6.4%  Diabetes:              >6.4%  Glycemic control for   <7.0% adults with diabetes     CBG: Recent Labs  Lab 05/20/23 1216 05/20/23 1946 05/20/23 2319 05/21/23 0318 05/21/23 0749  GLUCAP 183* 203* 151* 117* 137*    Cyril Mourning MD. FCCP. [facility] Pager : [phone]  If no response to pager , please call [phone] until 7 pm After 7:00 pm call Elink  [phone]    05/21/2023, 9:35 AM

## 2023-05-21 NOTE — Progress Notes (Signed)
OT Cancellation Note  Patient Details Name: BRIENNE POSTHUMUS MRN: [id number] DOB: 12-01-1955   Cancelled Treatment:    Reason Eval/Treat Not Completed: Active bedrest order until 1611 today 12/8. OT team to return as schedule allows and pt medically ready.   Tyler Deis, OTR/L [facility] Acute Rehabilitation Office: [phone]   Myrla Halsted 05/21/2023, 6:47 AM

## 2023-05-21 NOTE — Progress Notes (Signed)
  Echocardiogram 2D Echocardiogram has been performed.  Jennifer [name] 05/21/2023, 3:50 PM

## 2023-05-21 NOTE — Progress Notes (Addendum)
STROKE TEAM PROGRESS NOTE   BRIEF HPI [name] is a 67 y.o. female with past medical history significant for CAD, HLD, HTN, hypothyroidism, CKD on home hemodialysis, stroke in 2019/2020 with residual mild left-sided weakness, diabetes who presented to [facility] with acute onset expressive aphasia and worsening left-sided weakness.  She was at her baseline in the morning but during her home dialysis she acutely became nonverbal and stopped moving her left arm.  On neuro telemetry consult her NIH was 16.  Head CT was negative.  TNK was given @ 1254 after discussion with daughter [name], who is a Engineer, civil (consulting), who gave consent.  She was then transferred to [facility]. On admission to [facility], ICU: At 1530 facial droop was noted as worsening and patient was becoming nauseous and started vomiting.  CT showed a hemorrhage.  She was also found to have rapidly increasing fistula site hematoma. TXA and Cryo given. Vascular surgery consulted for fistula hematoma.   LKW: 110 12/7 mRs: 1-2, patient used walker at home.  TNK: 1254 12/7 @ [facility] NO EVT due to No LVO  NIH on Arrival to [facility]: 10   SIGNIFICANT HOSPITAL EVENTS  12/7: TNK given @ 1254 @ [facility]  Transferred to [facility]  Acute mental change--> STAT CT: Acute right pontine hemorrhage  TXA and Cryo given  INTERIM HISTORY/SUBJECTIVE  Daughter at bedside.  RN at bedside. Denies dizziness, headache, SOB.   Patient sitting up in bed, moderate dysarthria, right upper and lower facial droop.  Right upper arm fistula with continued bruising but no active bleeding.    Vascular surgery did remove wrap this morning and said was okay for dialysis tomorrow.  OBJECTIVE  CBC    Component Value Date/Time   WBC 5.8 05/20/2023 1222   RBC 3.93 05/20/2023 1222   HGB 11.1 (L) 05/20/2023 1222   HGB 11.6 05/20/2020 1453   HCT 36.1 05/20/2023 1222   HCT 35.8 05/20/2020 1453   PLT 186 05/20/2023 1222   PLT 276 05/20/2020 1453   MCV 91.9  05/20/2023 1222   MCV 84 05/20/2020 1453   MCH 28.2 05/20/2023 1222   MCHC 30.7 05/20/2023 1222   RDW 14.9 05/20/2023 1222   RDW 13.5 05/20/2020 1453   LYMPHSABS 1.1 05/20/2023 1222   LYMPHSABS 1.7 05/20/2020 1453   MONOABS 0.4 05/20/2023 1222   EOSABS 0.1 05/20/2023 1222   EOSABS 0.1 05/20/2020 1453   BASOSABS 0.1 05/20/2023 1222   BASOSABS 0.1 05/20/2020 1453    BMET    Component Value Date/Time   NA 136 05/20/2023 1222   K 3.5 05/20/2023 1222   CL 96 (L) 05/20/2023 1222   CO2 24 05/20/2023 1222   GLUCOSE 234 (H) 05/20/2023 1222   BUN 43 (H) 05/20/2023 1222   CREATININE 4.03 (H) 05/20/2023 1222   CALCIUM 8.8 (L) 05/20/2023 1222   GFRNONAA 12 (L) 05/20/2023 1222    IMAGING past 24 hours MR BRAIN WO CONTRAST  Result Date: 05/21/2023 CLINICAL DATA:  67 year old female code stroke presentation on 05/20/2023 with small right brainstem hemorrhage. EXAM: MRI HEAD WITHOUT CONTRAST TECHNIQUE: Multiplanar, multiecho pulse sequences of the brain and surrounding structures were obtained without intravenous contrast. COMPARISON:  Head CTs 05/20/2023 and earlier. Brain MRI 12/01/2022 and earlier. FINDINGS: Brain: Chronic very severe small-vessel disease signal changes in the deep white matter, deep gray matter, brainstem, cerebellum. Scattered chronic microhemorrhages and chronic transependymal hemosiderin bilaterally. Extensive brainstem chronic hemorrhages on SWI. Deep cerebellar chronic microhemorrhages also.  The above findings are chronic but mildly progressed since the June MRI. Superimposed small acute dorsal right brainstem hemorrhage is most apparent as DWI artifact (series 2, image 16), hypointense on T1 and 4-5 mm (series 8, image 14). No significant posterior fossa mass effect. Superimposed punctate posterosuperior left frontal lobe restricted diffusion in the subcortical white matter of the perirolandic area series 2, image 38. No other convincing restricted diffusion. No superimposed  midline shift, evidence of mass lesion, ventriculomegaly. Cervicomedullary junction and pituitary are within normal limits. Vascular: Major intracranial vascular flow voids are preserved. Skull and upper cervical spine: Stable, negative. Sinuses/Orbits: Stable, negative. Other: Mastoids are well aerated. Grossly normal visible internal auditory structures. Negative visible scalp and face. IMPRESSION: 1. Punctate posterior Left MCA territory subcortical white matter infarct. No mass or hemorrhage there. 2. Subcentimeter acute right dorsal brainstem hemorrhage is stable since yesterday. No mass effect. 3. Underlying very severe underlying chronic small vessel disease including extensive chronic microhemorrhages and small vessel infarcts throughout the brain - mildly progressed from a June MRI. Electronically Signed   By: Odessa Fleming M.D.   On: 05/21/2023 09:36   CT HEAD WO CONTRAST ( )  Result Date: 05/20/2023 CLINICAL DATA:  Hemorrhage follow-up EXAM: CT HEAD WITHOUT CONTRAST TECHNIQUE: Contiguous axial images were obtained from the base of the skull through the vertex without intravenous contrast. RADIATION DOSE REDUCTION: This exam was performed according to the departmental dose-optimization program which includes automated exposure control, adjustment of the mA and/or kV according to patient size and/or use of iterative reconstruction technique. COMPARISON:  05/20/2023 at 3:48 p.m. FINDINGS: Brain: Unchanged appearance of small intraparenchymal hematoma at the right middle cerebellar peduncle. Chronic white matter hypoattenuation. Old left cerebellar infarct. No extra-axial collection. No mass effect or hydrocephalus. Vascular: There is atherosclerotic calcification of both internal carotid arteries at the skull base. Skull: Normal Sinuses/Orbits: Negative Other: None IMPRESSION: Unchanged appearance of small intraparenchymal hematoma at the right middle cerebellar peduncle. Electronically Signed   By: Deatra Robinson M.D.   On: 05/20/2023 21:07   DG CHEST PORT 1 VIEW  Result Date: 05/20/2023 CLINICAL DATA:  Hypoxia, nausea and vomiting EXAM: PORTABLE CHEST 1 VIEW COMPARISON:  01/23/2022 FINDINGS: Single frontal view of the chest demonstrates an unremarkable cardiac silhouette. No airspace disease, effusion, or pneumothorax. No acute bony abnormalities. IMPRESSION: 1. No acute intrathoracic process. Electronically Signed   By: Sharlet Salina M.D.   On: 05/20/2023 17:25   CT HEAD WO CONTRAST ( )  Result Date: 05/20/2023 CLINICAL DATA:  Neuro deficit, acute, stroke suspected EXAM: CT HEAD WITHOUT CONTRAST TECHNIQUE: Contiguous axial images were obtained from the base of the skull through the vertex without intravenous contrast. RADIATION DOSE REDUCTION: This exam was performed according to the departmental dose-optimization program which includes automated exposure control, adjustment of the mA and/or kV according to patient size and/or use of iterative reconstruction technique. COMPARISON:  Same day CT head FINDINGS: Brain: There is a 7 x 8 mm site acute hemorrhage in the right dorsal pons/brachium pontis (series 2, image 8). Sequela of mild chronic microvascular ischemic change. Chronic infarcts in the right basal ganglia and left cerebellum. No significant mass effect. No mass lesion. Vascular: No hyperdense vessel or unexpected calcification. Skull: Normal. Negative for fracture or focal lesion. Sinuses/Orbits: No middle ear or mastoid effusion. Paranasal sinuses are clear. Bilateral lens. Orbits are otherwise unremarkable. Other: None. IMPRESSION: Acute hemorrhage in the right dorsal pons/brachium pontis measuring 7 x 8 mm. No significant mass effect. Findings  were paged to Dr. Selina Cooley on 05/20/23 at 4:21 PM. Electronically Signed   By: Lorenza Cambridge M.D.   On: 05/20/2023 16:21   CT ANGIO HEAD NECK W WO CM (CODE STROKE)  Result Date: 05/20/2023 CLINICAL DATA:  67 year old female code stroke presentation with  left side weakness, facial droop, aphasia. EXAM: CT ANGIOGRAPHY HEAD AND NECK TECHNIQUE: Multidetector CT imaging of the head and neck was performed using the standard protocol during bolus administration of intravenous contrast. Multiplanar CT image reconstructions and MIPs were obtained to evaluate the vascular anatomy. Carotid stenosis measurements (when applicable) are obtained utilizing NASCET criteria, using the distal internal carotid diameter as the denominator. RADIATION DOSE REDUCTION: This exam was performed according to the departmental dose-optimization program which includes automated exposure control, adjustment of the mA and/or kV according to patient size and/or use of iterative reconstruction technique. CONTRAST:  75mL OMNIPAQUE IOHEXOL 350 MG/ML SOLN COMPARISON:  Plain head CT 1223 hours today. Prior CTA head and neck 10/08/2018. FINDINGS: CTA NECK Skeleton: Largely absent dentition. Mild for age cervical spine degeneration. No acute osseous abnormality identified. Upper chest: Negative. Other neck: Small volume retained secretions in the hypopharynx. Otherwise negative. Aortic arch: 3 vessel arch. Chronic Calcified aortic atherosclerosis. Right carotid system: Brachiocephalic artery plaque without stenosis. Negative right CCA. Mild calcified plaque at the right carotid bifurcation more affecting the ECA. No stenosis. Left carotid system: Mild left CCA origin calcified plaque without stenosis. Mild calcified plaque at the left carotid bifurcation. Mild tortuosity. No stenosis. Vertebral arteries: Mild right subclavian origin calcified plaque without stenosis. Minimal plaque at the right vertebral artery origin without stenosis. Tortuous right V1 segment. Right vertebral patent to the skull base without stenosis. Proximal left subclavian artery soft and calcified plaque without stenosis. Left vertebral artery origin remains normal. Tortuous left V1 segment. Codominant left vertebral artery is  mildly tortuous to the skull base with no plaque or stenosis. CTA HEAD Posterior circulation: Distal vertebral arteries, vertebrobasilar junction and PICA origins appear patent and normal. No basilar artery stenosis. Patent AICA, SCA, PCA origins. Bilateral PCA branches remain patent. Left P2 segment moderate irregularity and stenosis on series 12, image 22 appears increased. Chronic right P2 segment irregularity and stenosis is mild to moderate and stable. Anterior circulation: Both ICA siphons are patent but heavily calcified. On the left moderate cavernous and anterior genu stenosis appears progressed since 2020. On the right moderate to severe cavernous segment (series 6, image 246) and anterior genu stenosis also appears progressed. Patent carotid termini. Normal MCA and ACA origins. Normal anterior communicating artery. Small median artery of the corpus callosum. Bilateral ACA branches are stable and within normal limits. Left MCA M1 segment and bifurcation are patent without stenosis. Right MCA M1 segment and bifurcation are patent without stenosis. Bilateral MCA branches appear stable since 2020. Mild if any branch irregularity. Venous sinuses: Patent. Anatomic variants: None significant. Review of the MIP images confirms the above findings IMPRESSION: 1. Negative for large vessel occlusion. Preliminary report of this finding communicated to Dr. Bing Neighbors at 1247 hours on 05/20/2023 by text page via the [facility] messaging system. 2. Positive for chronic ICA siphon calcified plaque where stenosis appears progressed from a 2020 CTA, up to Moderate on the Left and Severe on the Right. Moderate Left PCA P2 segment stenosis also appears increased. Mild for age extracranial atherosclerosis without stenosis. 3.  Aortic Atherosclerosis (ICD10-I70.0). Electronically Signed   By: Odessa Fleming M.D.   On: 05/20/2023 12:55   CT HEAD  CODE STROKE WO CONTRAST  Result Date: 05/20/2023 CLINICAL DATA:  Code stroke.  Neuro  deficit, acute, stroke suspected EXAM: CT HEAD WITHOUT CONTRAST TECHNIQUE: Contiguous axial images were obtained from the base of the skull through the vertex without intravenous contrast. RADIATION DOSE REDUCTION: This exam was performed according to the departmental dose-optimization program which includes automated exposure control, adjustment of the mA and/or kV according to patient size and/or use of iterative reconstruction technique. COMPARISON:  Brain MR 07/02/22 FINDINGS: Brain: No hemorrhage. No hydrocephalus. No extra-axial fluid collection. There is sequela of severe chronic microvascular ischemic change. Chronic infarcts in the right basal ganglia and left cerebellum. Vascular: No hyperdense vessel or unexpected calcification. Skull: Normal. Negative for fracture or focal lesion. Sinuses/Orbits: No middle ear or mastoid effusion. Paranasal sinuses are clear. Bilateral lens replacement. Orbits are otherwise unremarkable. Other: None. ASPECTS ([place] Stroke Program Early CT Score): 10 IMPRESSION: 1. No hemorrhage or CT evidence of an acute cortical infarct. Aspects is 10. 2. Sequela of severe chronic microvascular ischemic change. Chronic infarcts in the right basal ganglia and left cerebellum. Findings were discussed with Dr. Deretha Emory on 05/20/23 at 12:38 PM Electronically Signed   By: Lorenza Cambridge M.D.   On: 05/20/2023 12:39    Vitals:   05/21/23 0600 05/21/23 0700 05/21/23 0800 05/21/23 0900  BP: (!) 133/48 (!) 154/57 (!) 146/59 (!) 152/55  Pulse: 81 85 81 83  Resp: 13 13 19 13   Temp:   98 F (36.7 C)   TempSrc:   Oral   SpO2: 99% 100% 98% 100%  Weight:         PHYSICAL EXAM General:  Alert, well-nourished, well-developed patient in no acute distress Psych:  Mood and affect appropriate for situation CV: Regular rate and rhythm on monitor Respiratory:  Regular, unlabored respirations on room air GI: Abdomen soft and nontender   NEURO:  Mental Status: Awake, alert, oriented to  age, place, time and people. Speech/Language: speech is with moderate dysarthria, paucity of speech.  No aphasia.  Naming, repetition, fluency, and comprehension intact.  Cranial Nerves:  II: PERRL. Visual fields full.  III, IV, VI: EOMI. no gaze palsy, tracking bilaterally, left gaze nystagmus.  Right eye ptosis. V: Sensation is intact to light touch and symmetrical to face.  VII: Right upper and lower facial weakness VIII: hearing intact to voice. IX, X: Palate elevates symmetrically. Phonation is normal.  ON:GEXBMWUX shrug 5/5. XII: Tongue protrusion to the right Motor: BUE: 4+/5 with no drift BLE: 3/5, no drift.  Tone: is normal and bulk is normal Sensation- Intact to light touch bilaterally.  Coordination: slow FNT, no ataxia Gait- deferred  Most Recent NIH: 8    ASSESSMENT/PLAN  Acute Ischemic Infarct s/p TNK with hemorrhagic transformation s/p reversal with TXA and Cryo, etiology: large vessel disease vs. Cardioembolic source Code Stroke CT head No acute abnormality.  ASPECTS 10.    CTA head & neck: No LVO, right ICA siphon severe stenosis, left ICA siphon moderate and left P2 moderate stenosis. Repeat CTH on admission to [facility] ICU with neuro change: Acute hemorrhage in the right dorsal pons/brachium pontis measuring 7 x 8 mm. No significant mass effect Repeat CTH stability scan: Unchanged appearance of small intraparenchymal hematoma right middle cerebellar peduncle MRI  Punctate posterior left MCA territory subcortical white matter infarct. Acute right dorsal brainstem hemorrhage stable. Underlying very severe chronic small vessel disease including extensive chronic microhemorrhages and small vessel infarcts throughout brain 2D Echo: LVEF 60 to 65%  VAS [place] LE DVT: Negative Consider 30-day cardiac event monitoring as outpatient LDL pending HgbA1c 6.2 VTE prophylaxis - SCDs aspirin 81 mg daily prior to admission, now on No antithrombotic due to hemorrhagic transformation s/p  TNK.  Therapy recommendations:  CIR Disposition:  pending  Hx of Stroke/TIA June 2019, admitted for left sided tingling, blurry vision.  MRI showed Right Thalamic infarcts and right frontoparietal white matter lacunar infarct, multiple remote lacunar infarcts seen.  MRA, carotid Doppler and 2D echo unremarkable. Aspirin increased to 162mg .  And discharged also on statin April 2020, admitted for nausea vomiting, ataxia and vertigo. MRI shows Left lateral corpus collosum, with diffusion positive lesion likely a large number of lacuanr infarct.  Extensive changes of chronic small vessel disease involving brainstem.  CT head and neck showed bilateral ICA siphon moderate stenosis.  Echo showed normal ejection fraction.  Patient was discharged home with no therapy needs.  A1c was 6.4, LDL was elevated.  She was started on Lipitor 80 mg and on DAPT. June 2024 Outpatient MRI showed chronic lacunar ischemic infarcts within right basal ganglia, bilateral thalami and bilateral cerebellar hemispheres, moderate chronic small vessel disease and mild to moderate atrophy Follows with Dr. Pearlean Brownie outpatient   Hypertension Hypotension with dialysis Home meds: Amlodipine 10 mg, carvedilol 12.5 mg Lasix 80 mg Per daughter, patient's blood pressure meds were recently lowered and she was started on midodrine before dialysis due to hypotension during dialysis treatments Cleviprex gtt, wean as tolerated Blood Pressure Goal: SBP less than 160  Long-term BP goal normotensive  Hyperlipidemia Home meds: Lipitor 80 mg LDL pending, goal < 70 Will resume statin once p.o. access  Diabetes type II Controlled Home meds: Humalog insulin HgbA1c 6.2, goal < 7.0 CBGs SSI Recommend close follow-up with PCP   Dysphagia Patient has post-stroke dysphagia SLP consulted Currently n.p.o. Pending modified barium swallow test  Other Stroke Risk Factors Obesity, Body mass index is 32.38 kg/m., BMI >/= 30 associated with  increased stroke risk, recommend weight loss, diet and exercise as appropriate  Family hx stroke (sister) Coronary artery disease Former smoker  Other Active Problems Dementia on Namenda, resume Namenda once p.o. access ESRD on hemodialysis Receives hemodialysis Tuesday, Wednesday, Saturday, Sunday at home Plan for HD tomorrow   Hospital day # 1   Pt seen by Neuro NP/APP and later by MD. Note/plan to be edited by MD as needed.    Lynnae January, DNP, AGACNP-BC [facility] Please use AMION for contact information & EPIC for messaging.  ATTENDING NOTE: I reviewed above note and agree with the assessment and plan. Pt was seen and examined.   Daughter at bedside.  Patient lying in bed, awake, alert, eyes open, orientated to age, place, time and people. No aphasia, moderate dysarthria, paucity of speech, following all simple commands. Able to name and repeat. No gaze palsy, tracking bilaterally, left gaze nystagmus, direction to the left.  Visual field full, PERRL.  Right upper and lower facial weakness. Tongue protrusion to the right. Bilateral UEs 4+/5, no drift. Bilaterally LEs 3/5, no drift. Sensation symmetrical bilaterally, b/l FTN intact grossly but slow, gait not tested.   Daughter at bedside.  Patient clinically doing well, no aphasia with hemorrhagic transformation with only right upper and lower facial droop.  MRI and CT showed hemorrhagic transmission stable.  Did not pass swallow, still n.p.o.  Pending modified barium study tomorrow.  BP goal less than 160, consider resume statin and Namenda once p.o. access.  PT and  OT pending CIR.  For detailed assessment and plan, please refer to above/below as I have made changes wherever appropriate.   Marvel Plan, MD PhD Stroke Neurology 05/21/2023 6:09 PM  This patient is critically ill due to left MCA stroke status post TNK, complicated by hemorrhagic transmission status post reversal, dysphagia and at significant risk of  neurological worsening, death form recurrent stroke, hematoma expansion, aspiration pneumonia, sepsis, heart failure. This patient's care requires constant monitoring of vital signs, hemodynamics, respiratory and cardiac monitoring, review of multiple databases, neurological assessment, discussion with family, other specialists and medical decision making of high complexity. I spent 40 minutes of neurocritical care time in the care of this patient. I had long discussion with daughter at bedside, updated pt current condition, treatment plan and potential prognosis, and answered all the questions.  She expressed understanding and appreciation.       To contact Stroke Continuity provider, please refer to [email or web address]. After hours, contact General Neurology

## 2023-05-21 NOTE — Progress Notes (Addendum)
SLP Cancellation Note  Patient Details Name: Jennifer [name] MRN: [id number] DOB: [birth year]   Cancelled treatment:       Reason Eval/Treat Not Completed: Patient at procedure or test/unavailable  RN leaving with patient for MRI.  ST will continue efforts to complete cogntive/linguistic and swallow evaluation.    Dimas Aguas, MA, CCC-SLP Acute Rehab SLP [phone]  Fleet Contras 05/21/2023, 8:00 AM

## 2023-05-22 ENCOUNTER — Inpatient Hospital Stay (HOSPITAL_COMMUNITY): Payer: Medicare Other

## 2023-05-22 ENCOUNTER — Encounter (HOSPITAL_COMMUNITY): Payer: Self-pay | Admitting: Neurology

## 2023-05-22 LAB — CBC
HCT: 26.5 % — ABNORMAL LOW (ref 36.0–46.0)
Hemoglobin: 8.5 g/dL — ABNORMAL LOW (ref 12.0–15.0)
MCH: 28.3 pg (ref 26.0–34.0)
MCHC: 32.1 g/dL (ref 30.0–36.0)
MCV: 88.3 fL (ref 80.0–100.0)
Platelets: 190 10*3/uL (ref 150–400)
RBC: 3 MIL/uL — ABNORMAL LOW (ref 3.87–5.11)
RDW: 15.1 % (ref 11.5–15.5)
WBC: 6 10*3/uL (ref 4.0–10.5)
nRBC: 0 % (ref 0.0–0.2)

## 2023-05-22 LAB — BASIC METABOLIC PANEL
Anion gap: 14 (ref 5–15)
BUN: 56 mg/dL — ABNORMAL HIGH (ref 8–23)
CO2: 23 mmol/L (ref 22–32)
Calcium: 8.1 mg/dL — ABNORMAL LOW (ref 8.9–10.3)
Chloride: 100 mmol/L (ref 98–111)
Creatinine, Ser: 5.68 mg/dL — ABNORMAL HIGH (ref 0.44–1.00)
GFR, Estimated: 8 mL/min — ABNORMAL LOW (ref >60)
Glucose, Bld: 113 mg/dL — ABNORMAL HIGH (ref 70–99)
Potassium: 3.2 mmol/L — ABNORMAL LOW (ref 3.5–5.1)
Sodium: 137 mmol/L (ref 135–145)

## 2023-05-22 LAB — PHOSPHORUS: Phosphorus: 6.3 mg/dL — ABNORMAL HIGH (ref 2.5–4.6)

## 2023-05-22 LAB — LIPID PANEL
Cholesterol: 163 mg/dL (ref 0–200)
HDL: 37 mg/dL — ABNORMAL LOW (ref >40)
LDL Cholesterol: 102 mg/dL — ABNORMAL HIGH (ref 0–99)
Total CHOL/HDL Ratio: 4.4 {ratio}
Triglycerides: 119 mg/dL (ref <150)
VLDL: 24 mg/dL (ref 0–40)

## 2023-05-22 LAB — GLUCOSE, CAPILLARY
Glucose-Capillary: 108 mg/dL — ABNORMAL HIGH (ref 70–99)
Glucose-Capillary: 118 mg/dL — ABNORMAL HIGH (ref 70–99)
Glucose-Capillary: 127 mg/dL — ABNORMAL HIGH (ref 70–99)
Glucose-Capillary: 139 mg/dL — ABNORMAL HIGH (ref 70–99)
Glucose-Capillary: 201 mg/dL — ABNORMAL HIGH (ref 70–99)

## 2023-05-22 MED ORDER — MEMANTINE HCL 10 MG PO TABS
10.0000 mg | ORAL_TABLET | Freq: Two times a day (BID) | ORAL | Status: DC
  Administered 2023-05-22 – 2023-05-24 (×4): 10 mg via ORAL
  Filled 2023-05-22 (×4): qty 1

## 2023-05-22 MED ORDER — PENTAFLUOROPROP-TETRAFLUOROETH EX AERO: 1.0000 | INHALATION_SPRAY | CUTANEOUS | Status: DC | PRN

## 2023-05-22 MED ORDER — LIDOCAINE-PRILOCAINE 2.5-2.5 % EX CREA: 1.0000 | TOPICAL_CREAM | CUTANEOUS | Status: DC | PRN

## 2023-05-22 MED ORDER — HEPARIN SODIUM (PORCINE) 5000 UNIT/ML IJ SOLN
5000.0000 [IU] | Freq: Three times a day (TID) | INTRAMUSCULAR | Status: DC
Start: 2023-05-22 — End: 2023-05-24
  Administered 2023-05-22 – 2023-05-24 (×7): 5000 [IU] via SUBCUTANEOUS
  Filled 2023-05-22 (×7): qty 1

## 2023-05-22 MED ORDER — POLYVINYL ALCOHOL 1.4 % OP SOLN
1.0000 [drp] | OPHTHALMIC | Status: DC
  Administered 2023-05-22 – 2023-05-24 (×11): 1 [drp] via OPHTHALMIC
  Filled 2023-05-22: qty 15

## 2023-05-22 MED ORDER — ATORVASTATIN CALCIUM 80 MG PO TABS
80.0000 mg | ORAL_TABLET | Freq: Every day | ORAL | Status: DC
  Administered 2023-05-23: 80 mg via ORAL
  Filled 2023-05-22: qty 1

## 2023-05-22 MED ORDER — HEPARIN SODIUM (PORCINE) 1000 UNIT/ML DIALYSIS: 1000.0000 [IU] | INTRAMUSCULAR | Status: DC | PRN

## 2023-05-22 MED ORDER — LIDOCAINE HCL (PF) 1 % IJ SOLN: 5.0000 mL | INTRAMUSCULAR | Status: DC | PRN

## 2023-05-22 MED ORDER — ENSURE ENLIVE PO LIQD
237.0000 mL | Freq: Two times a day (BID) | ORAL | Status: DC
  Administered 2023-05-23 (×2): 237 mL via ORAL

## 2023-05-22 MED ORDER — ALTEPLASE 2 MG IJ SOLR
2.0000 mg | Freq: Once | INTRAMUSCULAR | Status: DC | PRN
Start: 2023-05-22 — End: 2023-05-24

## 2023-05-22 NOTE — Progress Notes (Signed)
  [facility] Progress Note   Subjective:   Patient's daughter at bedside. Pt w/o c/o's.   Objective Vitals:   05/22/23 1145 05/22/23 1200 05/22/23 1215 05/22/23 1230  BP: 135/60 124/61 116/65 119/69  Pulse: 98 96 98 98  Resp: 19 18 16 18   Temp: 98 F (36.7 C) 98.1 F (36.7 C)    TempSrc:  Oral    SpO2: 100% 100% 100% 100%  Weight:       Physical Exam General: Awake, chronically-ill appearing, on O2, NAD Neuro: R-sided facial droop Heart: S1 and S2; No MRGs Lungs: Clear anteriorly Abdomen: Soft and non-tender Extremities: 1+bilat hip/ thigh edema Dialysis Access: R AVF (+) B/T, bruising and swelling improving   Dialysis Orders: [facility] 4x weekly Sat/Sun/Tues/Wed 3hrs 2K BFR 400 EDW 88kg (per patient's daughter, EDW recently lowered last week) NO heparin for now due to brainstem bleed  Assessment/Plan:  # Acute ischemic infarction s/p TNK with brainstem hemorrhage - Repeat head ct showed an acute hemorrhage in the right brainstem measuring 7X8 cm, no mass effect. TNK reversed. Neurology following and on neuro ICU.   # ESRD -  on HHD outpatient. Doing HD MWF here. Next HD today. No heparin during treatments at this time due to #1.  # Hypertension/volume  - Not grossly overloaded on exam. SP IV Cleviprex, dc'd this am. Getting PRN hydralazine IV. No standing po BP meds.   # Anemia of CKD - Hgb 11 > 8-9. Follow. No Fe/ESA indicated at this time  # Secondary Hyperparathyroidism - CCa and phos are okay.   # Nutrition - NPO. Changed to renal diet with fluid restrictions when medically stable.  Vinson Moselle  MD  [facility] 05/22/2023, 12:36 PM  Recent Labs  Lab 05/20/23 1222 05/22/23 0419  HGB 11.1* 8.5*  ALBUMIN 2.9*  --   CALCIUM 8.8* 8.1*  PHOS  --  6.3*  CREATININE 4.03* 5.68*  K 3.5 3.2*    Inpatient medications:  Chlorhexidine Gluconate Cloth  6 each Topical Q0600   heparin injection (subcutaneous)  5,000 Units Subcutaneous Q8H   insulin aspart   0-6 Units Subcutaneous Q4H   mouth rinse  15 mL Mouth Rinse 4 times per day   pantoprazole (PROTONIX) IV  40 mg Intravenous QHS   polyvinyl alcohol  1 drop Both Eyes Q4H   senna-docusate  1 tablet Oral BID   sodium chloride flush  3 mL Intravenous Q12H    acetaminophen **OR** acetaminophen (TYLENOL) oral liquid 160 mg/5 mL **OR** acetaminophen, alteplase, artificial tears, heparin, hydrALAZINE, lidocaine (PF), lidocaine-prilocaine, ondansetron (ZOFRAN) IV, mouth rinse, pentafluoroprop-tetrafluoroeth

## 2023-05-22 NOTE — Progress Notes (Signed)
Patient admitted s/p stroke. Dialysis at bedside. Patient's blood pressure within normal limits, placed on 2L [place], able to remove once oxygen was in normal range. Patient had one complaint of pain, PRN tylenol given, awaiting results. Speech and Radiology consulted for planned MBSS. PT at bedside able to get patient to the side of bed. Patient and family updated in plan of care. Transferred to [facility], Progressive status maintained.

## 2023-05-22 NOTE — Progress Notes (Signed)
STROKE TEAM PROGRESS NOTE   BRIEF HPI [name] is a 67 y.o. female with past medical history significant for CAD, HLD, HTN, hypothyroidism, CKD on home hemodialysis, stroke in 2019/2020 with residual mild left-sided weakness, diabetes who presented to [facility] with acute onset expressive aphasia and worsening left-sided weakness.  She was at her baseline in the morning but during her home dialysis she acutely became nonverbal and stopped moving her left arm.  On neuro telemetry consult her NIH was 16.  Head CT was negative.  TNK was given @ 1254 after discussion with daughter Jennifer [name], who is a Engineer, civil (consulting), who gave consent.  She was then transferred to [facility]. On admission to [facility], ICU: At 1530 facial droop was noted as worsening and patient was becoming nauseous and started vomiting.  CT showed a hemorrhage.  She was also found to have rapidly increasing fistula site hematoma. TXA and Cryo given. Vascular surgery consulted for fistula hematoma.   LKW: 110 12/7 mRs: 1-2, patient used walker at home.  TNK: 1254 12/7 @ [facility] NO EVT due to No LVO  NIH on Arrival to [facility]: 10   SIGNIFICANT HOSPITAL EVENTS  12/7: TNK given @ 1254 @ [employer]  Transferred to [facility]  Acute mental change--> STAT CT: Acute right pontine hemorrhage  TXA and Cryo given  INTERIM HISTORY/SUBJECTIVE Husband and granddaughter are at bedside.  Patient lying bed, doing hemodialysis.  Patient awake alert, orientated, still has mild dysarthria, right facial droop improved.  OBJECTIVE  CBC    Component Value Date/Time   WBC 6.0 05/22/2023 0419   RBC 3.00 (L) 05/22/2023 0419   HGB 8.5 (L) 05/22/2023 0419   HGB 11.6 05/20/2020 1453   HCT 26.5 (L) 05/22/2023 0419   HCT 35.8 05/20/2020 1453   PLT 190 05/22/2023 0419   PLT 276 05/20/2020 1453   MCV 88.3 05/22/2023 0419   MCV 84 05/20/2020 1453   MCH 28.3 05/22/2023 0419   MCHC 32.1 05/22/2023 0419   RDW 15.1 05/22/2023 0419   RDW 13.5 05/20/2020  1453   LYMPHSABS 1.1 05/20/2023 1222   LYMPHSABS 1.7 05/20/2020 1453   MONOABS 0.4 05/20/2023 1222   EOSABS 0.1 05/20/2023 1222   EOSABS 0.1 05/20/2020 1453   BASOSABS 0.1 05/20/2023 1222   BASOSABS 0.1 05/20/2020 1453    BMET    Component Value Date/Time   NA 137 05/22/2023 0419   K 3.2 (L) 05/22/2023 0419   CL 100 05/22/2023 0419   CO2 23 05/22/2023 0419   GLUCOSE 113 (H) 05/22/2023 0419   BUN 56 (H) 05/22/2023 0419   CREATININE 5.68 (H) 05/22/2023 0419   CALCIUM 8.1 (L) 05/22/2023 0419   GFRNONAA 8 (L) 05/22/2023 0419    IMAGING past 24 hours ECHOCARDIOGRAM COMPLETE  Result Date: 05/21/2023    ECHOCARDIOGRAM REPORT   Patient Name:   Jennifer [name] Date of Exam: 05/21/2023 Medical Rec #:  [id number]     Height:       66.0 in Accession #:    [id number]    Weight:       200.6 lb Date of Birth:  [birth year]    BSA:          2.002 m Patient Age:    67 years      BP:           154/60 mmHg Patient Gender: F             HR:  78 bpm. Exam Location:  Inpatient Procedure: 2D Echo, Color Doppler and Cardiac Doppler Indications:    Stroke  History:        Patient has prior history of Echocardiogram examinations, most                 recent 05/24/2022. CAD, Stroke; Risk Factors:Diabetes and                 Hypertension. Hypothyroidism, CKD.  Sonographer:    Milda Smart Referring Phys: [id number] Jennifer [name] IMPRESSIONS  1. Left ventricular ejection fraction, by estimation, is 60 to 65%. The left ventricle has normal function. The left ventricle has no regional wall motion abnormalities. There is mild concentric left ventricular hypertrophy. Left ventricular diastolic parameters are consistent with Grade I diastolic dysfunction (impaired relaxation).  2. Right ventricular systolic function is normal. The right ventricular size is normal.  3. Left atrial size was mildly dilated.  4. Septum is hypermobile.  5. The mitral valve is grossly normal. No evidence of mitral valve regurgitation.  6. The  aortic valve is normal in structure. Aortic valve regurgitation is not visualized.  7. The inferior vena cava is normal in size with greater than 50% respiratory variability, suggesting right atrial pressure of 3 mmHg. Conclusion(s)/Recommendation(s): Septum is hypermobile; consider repeat limited TTE with bubble study. FINDINGS  Left Ventricle: Left ventricular ejection fraction, by estimation, is 60 to 65%. The left ventricle has normal function. The left ventricle has no regional wall motion abnormalities. The left ventricular internal cavity size was normal in size. There is  mild concentric left ventricular hypertrophy. Left ventricular diastolic parameters are consistent with Grade I diastolic dysfunction (impaired relaxation). Right Ventricle: The right ventricular size is normal. Right ventricular systolic function is normal. Left Atrium: Left atrial size was mildly dilated. Right Atrium: Right atrial size was normal in size. Pericardium: There is no evidence of pericardial effusion. Mitral Valve: The mitral valve is grossly normal. No evidence of mitral valve regurgitation. MV peak gradient, 7.7 mmHg. The mean mitral valve gradient is 4.0 mmHg. Tricuspid Valve: Tricuspid valve regurgitation is mild. Aortic Valve: The aortic valve is normal in structure. Aortic valve regurgitation is not visualized. Pulmonic Valve: Pulmonic valve regurgitation is not visualized. Aorta: The aortic root and ascending aorta are structurally normal, with no evidence of dilitation. Venous: The inferior vena cava is normal in size with greater than 50% respiratory variability, suggesting right atrial pressure of 3 mmHg. IAS/Shunts: No atrial level shunt detected by color flow Doppler.  LEFT VENTRICLE PLAX 2D LVIDd:         5.70 cm   Diastology LVIDs:         3.20 cm   LV e' medial:    6.31 cm/s LV PW:         1.10 cm   LV E/e' medial:  12.7 LV IVS:        1.10 cm   LV e' lateral:   6.09 cm/s LVOT diam:     2.00 cm   LV E/e'  lateral: 13.2 LV SV:         91 LV SV Index:   45 LVOT Area:     3.14 cm  RIGHT VENTRICLE             IVC RV Basal diam:  4.10 cm     IVC diam: 1.90 cm RV Mid diam:    3.00 cm RV S prime:     12.90 cm/s TAPSE (M-mode):  2.5 cm LEFT ATRIUM             Index        RIGHT ATRIUM           Index LA diam:        3.60 cm 1.80 cm/m   RA Area:     15.80 cm LA Vol (A2C):   66.1 ml 33.01 ml/m  RA Volume:   39.50 ml  19.73 ml/m LA Vol (A4C):   60.4 ml 30.16 ml/m LA Biplane Vol: 66.0 ml 32.96 ml/m  AORTIC VALVE LVOT Vmax:   140.00 cm/s LVOT Vmean:  92.800 cm/s LVOT VTI:    0.289 m  AORTA Ao Root diam: 3.20 cm Ao Asc diam:  2.90 cm MITRAL VALVE                TRICUSPID VALVE MV Area (PHT): 1.67 cm     TR Peak grad:   25.8 mmHg MV Area VTI:   2.99 cm     TR Vmax:        254.00 cm/s MV Peak grad:  7.7 mmHg MV Mean grad:  4.0 mmHg     SHUNTS MV Vmax:       1.39 m/s     Systemic VTI:  0.29 m MV Vmean:      90.9 cm/s    Systemic Diam: 2.00 cm MV Decel Time: 453 msec MV E velocity: 80.30 cm/s MV A velocity: 122.00 cm/s MV E/A ratio:  0.66 [place] signed by Carolan Clines Signature Date/Time: 05/21/2023/4:04:38 PM    Final     Vitals:   05/22/23 1045 05/22/23 1056 05/22/23 1100 05/22/23 1115  BP: (!) 130/59 (!) 130/59 (!) 129/58 (!) 123/57  Pulse: 85 96 95 98  Resp: 14 17 18 16   Temp:      TempSrc:      SpO2: 100% 99% 100% 99%  Weight:         PHYSICAL EXAM General:  Alert, well-nourished, well-developed patient in no acute distress Psych:  Mood and affect appropriate for situation CV: Regular rate and rhythm on monitor Respiratory:  Regular, unlabored respirations on room air GI: Abdomen soft and nontender   NEURO:  awake, alert, eyes open, orientated to age, place, time and people. No aphasia, moderate dysarthria, paucity of speech, following all simple commands. Able to name and repeat. No gaze palsy, tracking bilaterally, left gaze nystagmus, direction to the left.  Visual field full,  PERRL.  Right upper and lower facial weakness, but seems improved from yesterday. Tongue protrusion to the right. Bilateral UEs 4+/5, no drift. Bilaterally LEs 3/5, no drift. Sensation symmetrical bilaterally, b/l FTN intact grossly but slow, gait not tested.     ASSESSMENT/PLAN  Acute Ischemic Infarct s/p TNK with hemorrhagic transformation s/p reversal with TXA and Cryo, etiology: large vessel disease vs. Cardioembolic source Code Stroke CT head No acute abnormality.  ASPECTS 10.    CTA head & neck: No LVO, right ICA siphon severe stenosis, left ICA siphon moderate and left P2 moderate stenosis. Repeat CTH on admission to [facility] ICU with neuro change: Acute hemorrhage in the right dorsal pons/brachium pontis measuring 7 x 8 mm. No significant mass effect Repeat CTH stability scan: Unchanged appearance of small intraparenchymal hematoma right middle cerebellar peduncle MRI  Punctate posterior left MCA territory subcortical white matter infarct. Acute right dorsal brainstem hemorrhage stable. Underlying very severe chronic small vessel disease including extensive chronic microhemorrhages and small vessel infarcts throughout  brain 2D Echo: LVEF 60 to 65%  VAS [place] LE DVT: Negative Consider 30-day cardiac event monitoring as outpatient LDL 102 HgbA1c 6.2 VTE prophylaxis - heparin subq aspirin 81 mg daily prior to admission, now on No antithrombotic due to hemorrhagic transformation s/p TNK.  Therapy recommendations:  CIR Disposition:  pending  Hx of Stroke/TIA June 2019, admitted for left sided tingling, blurry vision.  MRI showed Right Thalamic infarcts and right frontoparietal white matter lacunar infarct, multiple remote lacunar infarcts seen.  MRA, carotid Doppler and 2D echo unremarkable. Aspirin increased to 162mg .  And discharged also on statin April 2020, admitted for nausea vomiting, ataxia and vertigo. MRI shows Left lateral corpus collosum, with diffusion positive lesion likely a large  number of lacuanr infarct.  Extensive changes of chronic small vessel disease involving brainstem.  CT head and neck showed bilateral ICA siphon moderate stenosis.  Echo showed normal ejection fraction.  Patient was discharged home with no therapy needs.  A1c was 6.4, LDL was elevated.  She was started on Lipitor 80 mg and on DAPT. June 2024 Outpatient MRI showed chronic lacunar ischemic infarcts within right basal ganglia, bilateral thalami and bilateral cerebellar hemispheres, moderate chronic small vessel disease and mild to moderate atrophy Follows with Dr. Pearlean Brownie outpatient   Hypertension Hypotension with dialysis Home meds: Amlodipine 10 mg, carvedilol 12.5 mg Lasix 80 mg Per daughter, patient's blood pressure meds were recently lowered and she was started on midodrine before dialysis due to hypotension during dialysis treatments Cleviprex gtt, wean as tolerated Blood Pressure Goal: SBP less than 160  Long-term BP goal normotensive  Hyperlipidemia Home meds: Lipitor 80 mg LDL 102, goal < 70 Resumed Lipitor 80 Continue statin on discharge  Diabetes type II Controlled Home meds: Humalog insulin HgbA1c 6.2, goal < 7.0 CBGs SSI Recommend close follow-up with PCP   Dysphagia Patient has post-stroke dysphagia SLP consulted Now on dysphagia 1 and seen liquid Advance diet as tolerated  Other Stroke Risk Factors Obesity, Body mass index is 31.42 kg/m., BMI >/= 30 associated with increased stroke risk, recommend weight loss, diet and exercise as appropriate  Family hx stroke (sister) Coronary artery disease Former smoker  Other Active Problems Dementia on Namenda, resumed home Namenda ESRD on hemodialysis Receives hemodialysis Tuesday, Wednesday, Saturday, Sunday at home Had HD 12/9  Hospital day # 2   Marvel Plan, MD PhD Stroke Neurology 05/22/2023 11:25 AM  This patient is critically ill due to left MCA stroke status post TNK, complicated by hemorrhagic transmission  status post reversal, dysphagia and at significant risk of neurological worsening, death form recurrent stroke, hematoma expansion, aspiration pneumonia, sepsis, heart failure. This patient's care requires constant monitoring of vital signs, hemodynamics, respiratory and cardiac monitoring, review of multiple databases, neurological assessment, discussion with family, other specialists and medical decision making of high complexity. I spent 35 minutes of neurocritical care time in the care of this patient. I had long discussion with husband and granddaughter at bedside, updated pt current condition, treatment plan and potential prognosis, and answered all the questions.  They expressed understanding and appreciation.       To contact Stroke Continuity provider, please refer to [email or web address]. After hours, contact General Neurology

## 2023-05-22 NOTE — Progress Notes (Signed)
OT Cancellation Note  Patient Details Name: Jennifer [name] MRN: [id number] DOB: Dec 21, 1955   Cancelled Treatment:    Reason Eval/Treat Not Completed: Patient at procedure or test/ unavailable (HD)  Mateo Flow 05/22/2023, 2:26 PM

## 2023-05-22 NOTE — Progress Notes (Signed)
Inpatient Rehab Coordinator Note:  I met with patient, spouse at bedside to discuss CIR recommendations and goals/expectations of CIR stay.  We reviewed 3 hrs/day of therapy, physician follow up, and average length of stay 2 weeks (dependent upon progress) with goals of Min A/CGA A.  Spouse can provide 24/7 support at home. We will need to see more progress and tolerance. We will continue to follow patient for medical readiness and potential CIR admission.   Rehab Admissons Coordinator [place], [place], [place] [phone]

## 2023-05-22 NOTE — Plan of Care (Signed)
  Problem: Self-Care: Goal: Ability to participate in self-care as condition permits will improve Outcome: Progressing Goal: Ability to communicate needs accurately will improve Outcome: Progressing   Problem: Nutrition: Goal: Risk of aspiration will decrease Outcome: Progressing Goal: Dietary intake will improve Outcome: Progressing   

## 2023-05-22 NOTE — Progress Notes (Signed)
   05/22/23 1315  Vitals  Temp 98 F (36.7 C)  Pulse Rate 94  Resp 19  BP 118/67  SpO2 98 %  O2 Device Nasal Cannula  Weight 85.7 kg  Type of Weight Post-Dialysis  Oxygen Therapy  O2 Flow Rate (L/min) 2 L/min  Post Treatment  Dialyzer Clearance Lightly streaked  Hemodialysis Intake (mL) 0 mL  Liters Processed 73.5  Fluid Removed (mL) 2500 mL  Tolerated HD Treatment Yes  AVG/AVF Arterial Site Held (minutes) 10 minutes  AVG/AVF Venous Site Held (minutes) 10 minutes   Pt tx done at bedside--4N20 Alert and oriented x 2 Informed consent signed and in chart.   TX duration:3.5  Patient tolerated well.  Alert, without acute distress.  Hand-off given to patient's nurse.   Access used: RUAF Access issues: pt had a previous infiltration prior to her hospital admission--16 gauge needle placed there and 15 gauge needle placed to arterial--nephrology aware  Total UF removed: 2500 Medication(s) given: none   [facility]

## 2023-05-22 NOTE — Plan of Care (Signed)
  Problem: Intracerebral Hemorrhage Tissue Perfusion: Goal: Complications of Intracerebral Hemorrhage will be minimized Outcome: Progressing   Problem: Education: Goal: Knowledge of disease or condition will improve Outcome: Progressing

## 2023-05-22 NOTE — Evaluation (Signed)
Modified Barium Swallow Study  Patient Details  Name: Jennifer [name] MRN: [id number] Date of Birth: Sep 06, 1955  Today's Date: 05/22/2023  Modified Barium Swallow completed.  Full report located under Chart Review in the Imaging Section.  History of Present Illness Jennifer [name] is a 67 y.o. female  has a past medical history of CAD (coronary artery disease), Hypercholesteremia, Hypertension, Hypothyroidism, Kidney disease, Renal disorder, Stroke (HCC) (10/2018), Thyroid disease, and Type 2 diabetes mellitus (HCC) (10/08/2018). who presents with acute onset complete expressive aphasia and worsening left-sided weakness s/p TNK at [facility], transferred to [facility] for further care.  Most recent CT head with acute hemorrhage dorsal pons/brachium pontis.   Clinical Impression Pt has an oral more than pharyngeal dysphagia. She does not have full labial seal on her R side and does best getting liquids via straw, both in terms of getting more liquid into her mouth as well as keeping it in there. She has relatively good lingual hold and posterior transit, although movement becomes more repetitive and disorganized as boluses become more solid. She does not fully masticate a small piece of cracker, with most of it coming back out of her oral cavity. Oral residue is pretty mild across consistencies but she cannot clear a barium tablet, which she then expectorated. Her pharyngeal strength and clearance are intact, but she has intermittent delays in initiating her swallow wtih solid consistencies more than liquids. No aspiration occurs. Recommend starting Dys 1 (puree) diet and thin liquids.  Factors that may increase risk of adverse event in presence of aspiration Rubye Oaks & Clearance Coots 2021): Limited mobility  Swallow Evaluation Recommendations Recommendations: PO diet PO Diet Recommendation: Dysphagia 1 (Pureed);Thin liquids (Level 0) Liquid Administration via: Straw Medication Administration: Crushed with  puree Supervision: Patient able to self-feed;Full supervision/cueing for swallowing strategies Swallowing strategies  : Minimize environmental distractions;Slow rate;Small bites/sips;Check for pocketing or oral holding;Check for anterior loss Postural changes: Position pt fully upright for meals Oral care recommendations: Oral care BID (2x/day) Caregiver Recommendations: Have oral suction available      Mahala Menghini., M.A. CCC-SLP Acute Rehabilitation Services Office [phone]  Secure chat preferred  05/22/2023,4:06 PM

## 2023-05-22 NOTE — Progress Notes (Signed)
Physical Therapy Treatment Patient Details Name: Jennifer [name] MRN: [id number] DOB: 11-05-1955 Today's Date: 05/22/2023   History of Present Illness Pt is a 67 y.o. female who presented 05/20/23 with aphasia and worsening L-sided weakness (mild residual weakness from prior CVA baseline). Head CT showed no acute process. TNK administered. Later, the pt's facial droop worsened and pt began to have nausea and vomiting. Repeat head CT showed acute hemorrhage in the right dorsal pons/brachium pontis. PMH: CAD, HTN, hypothyroidism, CVA in 2020 with residual mild left-sided weakness, thyroid disease, CKD, renal disorder, DM2    PT Comments  Pt is progressing steadily toward goals.  Emphasis on sitting balance, sit to stand and pivotal steps from bed to another bed for transport.    If plan is discharge home, recommend the following: Two people to help with walking and/or transfers;A lot of help with bathing/dressing/bathroom;Assistance with cooking/housework;Direct supervision/assist for financial management;Direct supervision/assist for medications management;Assist for transportation;Help with stairs or ramp for entrance   Can travel by private vehicle        Equipment Recommendations  None recommended by PT    Recommendations for Other Services Rehab consult     Precautions / Restrictions Precautions Precautions: Fall     Mobility  Bed Mobility Overal bed mobility: Needs Assistance Bed Mobility: Supine to Sit     Supine to sit: Mod assist, HOB elevated Sit to supine: Mod assist, +2 for safety/equipment   General bed mobility comments: pt initiated LE's off bed, but unable to come up and over to R elbow without truncal assist.    Transfers Overall transfer level: Needs assistance   Transfers: Sit to/from Stand, Bed to chair/wheelchair/BSC Sit to Stand: Mod assist   Step pivot transfers: Mod assist, Max assist, +2 safety/equipment       General transfer comment: face to face  assist for stability.  Pt listed heavily if assist given from the R and could stand more upright and well assisted if help given from the L side.  Assisted pivot steps over to new bed 3-4 feet away.    Ambulation/Gait               General Gait Details: mod/max assist pivot steps only   Stairs             Wheelchair Mobility     Tilt Bed    Modified Rankin (Stroke Patients Only) Modified Rankin (Stroke Patients Only) Pre-Morbid Rankin Score: Moderately severe disability Modified Rankin: Severe disability     Balance Overall balance assessment: Needs assistance Sitting-balance support: Single extremity supported, Feet supported, Bilateral upper extremity supported Sitting balance-Leahy Scale: Poor Sitting balance - Comments: initially, until standing trials, pt listed moderately to the left with work on coming into midline and holding.  After standing, midline held much better with occasional min assist   Standing balance support: Bilateral upper extremity supported, Single extremity supported, During functional activity Standing balance-Leahy Scale: Poor Standing balance comment: L lean, modA for static standing balance, mod/maxA to step                            Cognition Arousal: Alert Behavior During Therapy: WFL for tasks assessed/performed Overall Cognitive Status:  (NT formally)  Exercises      General Comments General comments (skin integrity, edema, etc.): vss      Pertinent Vitals/Pain Pain Assessment Pain Assessment: Faces Faces Pain Scale: Hurts whole lot Pain Location: head Pain Descriptors / Indicators: Aching Pain Intervention(s): Patient requesting pain meds-RN notified    Home Living                          Prior Function            PT Goals (current goals can now be found in the care plan section) Acute Rehab PT Goals PT Goal Formulation: With  patient/family Time For Goal Achievement: 06/04/23 Potential to Achieve Goals: Good Progress towards PT goals: Progressing toward goals    Frequency    Min 1X/week      PT Plan      Co-evaluation              AM-PAC PT "6 Clicks" Mobility   Outcome Measure  Help needed turning from your back to your side while in a flat bed without using bedrails?: A Little Help needed moving from lying on your back to sitting on the side of a flat bed without using bedrails?: A Lot Help needed moving to and from a bed to a chair (including a wheelchair)?: A Lot Help needed standing up from a chair using your arms (e.g., wheelchair or bedside chair)?: A Lot Help needed to walk in hospital room?: Total Help needed climbing 3-5 steps with a railing? : Total 6 Click Score: 11    End of Session   Activity Tolerance: Patient tolerated treatment well Patient left: in bed;with call bell/phone within reach;with nursing/sitter in room;with family/visitor present Nurse Communication: Mobility status PT Visit Diagnosis: Unsteadiness on feet (R26.81);Other abnormalities of gait and mobility (R26.89);Difficulty in walking, not elsewhere classified (R26.2);Other symptoms and signs involving the nervous system (R29.898);Dizziness and giddiness (R42);History of falling (Z91.81);Muscle weakness (generalized) (M62.81);Repeated falls (R29.6)     Time: 2956-2130 PT Time Calculation (min) (ACUTE ONLY): 30 min  Charges:    $Therapeutic Activity: 8-22 mins $Neuromuscular Re-education: 8-22 mins PT General Charges $$ ACUTE PT VISIT: 1 Visit                     05/22/2023  Jacinto Halim., PT Acute Rehabilitation Services [phone]  (office)   Eliseo Gum Fumie Fiallo 05/22/2023, 2:46 PM

## 2023-05-23 LAB — GLUCOSE, CAPILLARY
Glucose-Capillary: 116 mg/dL — ABNORMAL HIGH (ref 70–99)
Glucose-Capillary: 122 mg/dL — ABNORMAL HIGH (ref 70–99)
Glucose-Capillary: 133 mg/dL — ABNORMAL HIGH (ref 70–99)
Glucose-Capillary: 153 mg/dL — ABNORMAL HIGH (ref 70–99)
Glucose-Capillary: 189 mg/dL — ABNORMAL HIGH (ref 70–99)
Glucose-Capillary: 195 mg/dL — ABNORMAL HIGH (ref 70–99)
Glucose-Capillary: 236 mg/dL — ABNORMAL HIGH (ref 70–99)

## 2023-05-23 LAB — BASIC METABOLIC PANEL
Anion gap: 11 (ref 5–15)
BUN: 36 mg/dL — ABNORMAL HIGH (ref 8–23)
CO2: 28 mmol/L (ref 22–32)
Calcium: 7.9 mg/dL — ABNORMAL LOW (ref 8.9–10.3)
Chloride: 97 mmol/L — ABNORMAL LOW (ref 98–111)
Creatinine, Ser: 4.31 mg/dL — ABNORMAL HIGH (ref 0.44–1.00)
GFR, Estimated: 11 mL/min — ABNORMAL LOW (ref >60)
Glucose, Bld: 109 mg/dL — ABNORMAL HIGH (ref 70–99)
Potassium: 3.7 mmol/L (ref 3.5–5.1)
Sodium: 136 mmol/L (ref 135–145)

## 2023-05-23 LAB — CBC
HCT: 26.4 % — ABNORMAL LOW (ref 36.0–46.0)
Hemoglobin: 8.4 g/dL — ABNORMAL LOW (ref 12.0–15.0)
MCH: 28.3 pg (ref 26.0–34.0)
MCHC: 31.8 g/dL (ref 30.0–36.0)
MCV: 88.9 fL (ref 80.0–100.0)
Platelets: 186 10*3/uL (ref 150–400)
RBC: 2.97 MIL/uL — ABNORMAL LOW (ref 3.87–5.11)
RDW: 15.3 % (ref 11.5–15.5)
WBC: 6.6 10*3/uL (ref 4.0–10.5)
nRBC: 0 % (ref 0.0–0.2)

## 2023-05-23 LAB — HEPATITIS B SURFACE ANTIBODY, QUANTITATIVE: Hep B S AB Quant (Post): <3.5 m[IU]/mL — ABNORMAL LOW

## 2023-05-23 MED ORDER — CHLORHEXIDINE GLUCONATE CLOTH 2 % EX PADS: 6.0000 | MEDICATED_PAD | Freq: Every day | CUTANEOUS | Status: DC

## 2023-05-23 NOTE — Evaluation (Signed)
Speech Language Pathology Evaluation Patient Details Name: Jennifer [name] MRN: [id number] DOB: Nov 17, 1955 Today's Date: 05/23/2023 Time: 6962-9528 SLP Time Calculation (min) (ACUTE ONLY): 16 min  Problem List:  Patient Active Problem List   Diagnosis Date Noted   Stroke determined by clinical assessment (HCC) 05/20/2023   Anemia of chronic renal failure 12/19/2022   (HFpEF) heart failure with preserved ejection fraction (HCC) 11/25/2022   Auditory hallucination 07/26/2022   Carotid artery stenosis, asymptomatic, left 05/04/2022   Anemia of chronic disease 03/23/2022   Hyponatremia 03/23/2022   Hyperkalemia 03/23/2022   Dehydration 03/23/2022   Chronic diastolic CHF (congestive heart failure) (HCC) 03/23/2022   SBO (small bowel obstruction) (HCC) 03/22/2022   Closed left ankle fracture 01/26/2022   Type 2 diabetes mellitus with hyperglycemia (HCC) 01/07/2022   Acute kidney injury superimposed on chronic kidney disease (HCC) 11/19/2021   Frequent urination 07/13/2021   Change in mental status 07/13/2021   Gait abnormality 07/13/2021   Memory loss 07/13/2021   Abnormal urinalysis 07/13/2021   CKD (chronic kidney disease) stage 4, GFR 15-29 ml/min (HCC) 07/13/2021   Normocytic anemia 07/13/2021   Diabetic nephropathy (HCC) 05/28/2020   Diabetic retinopathy (HCC) 05/28/2020   Presence of insulin pump (external) (internal) 05/28/2020   Diabetic macular edema of right eye with proliferative retinopathy associated with type 2 diabetes mellitus (HCC) 04/16/2020   Macular pucker, right eye 04/16/2020   Vitreous hemorrhage of left eye (HCC) 04/16/2020   Pre-op examination 03/13/2020   Chronic venous insufficiency 02/06/2020   Constipation 05/28/2019   S/P TKR (total knee replacement), right 03/23/2019 05/07/2019   Primary osteoarthritis of right knee 04/02/2019   Unilateral primary osteoarthritis, right knee    Pedal edema 02/25/2019   Acute vertigo with vomiting and inability to  stand 10/08/2018   Uncontrolled type 2 diabetes mellitus with hyperglycemia, with long-term current use of insulin (HCC) 10/08/2018   Former smoker 10/08/2018   Carotid artery disease (HCC) 03/15/2018   History of CVA (cerebrovascular accident) 11/16/2017   Low back pain 10/26/2017   Screening mammogram, encounter for 09/18/2017   Coronary artery disease involving native coronary artery of native heart without angina pectoris 11/04/2015   NSTEMI (non-ST elevated myocardial infarction) (HCC) 11/03/2015   Leukocytosis 08/16/2010   Acquired hypothyroidism 07/06/2010   HLD (hyperlipidemia) 08/28/2008   Essential hypertension 11/13/2007   Proliferative diabetic retinopathy of left eye with macular edema associated with type 2 diabetes mellitus (HCC) 09/21/2006   Diabetic peripheral neuropathy (HCC) 09/21/2006   Obesity (BMI 30-39.9) 09/21/2006   Adjustment disorder with mixed anxiety and depressed mood 09/21/2006   CARPAL TUNNEL SYNDROME 09/21/2006   COPD (chronic obstructive pulmonary disease) (HCC) 09/21/2006   EDEMA 09/21/2006   MIGRAINES, HX OF 09/21/2006   Past Medical History:  Past Medical History:  Diagnosis Date   CAD (coronary artery disease)    cath 5/23 100% dist RCA lesion treated with 2 overlapping Integrity Resolute DES ( 2.25x59mm, 2.25x57mm), 60% mid RCA treated medically, 99% OM2 not amenable to PCI, 70% D1 lesion, EF normal   Hypercholesteremia    Hypertension    Hypothyroidism    Kidney disease    Renal disorder    Stroke (HCC) 10/2018   Thyroid disease    Type 2 diabetes mellitus (HCC) 10/08/2018   Past Surgical History:  Past Surgical History:  Procedure Laterality Date   ABDOMINAL HYSTERECTOMY     AV FISTULA PLACEMENT Right 06/28/2022   Procedure: RIGHT ARM ARTERIOVENOUS (AV) FISTULA CREATION;  Surgeon: Gretta Began  F, MD;  Location: [facility];  Service: Vascular;  Laterality: Right;   CARDIAC CATHETERIZATION N/A 11/03/2015   Procedure: Left Heart Cath and  Coronary Angiography;  Surgeon: Marykay Lex, MD;  Location: [facility] INVASIVE CV LAB;  Service: Cardiovascular;  Laterality: N/A;   CARDIAC CATHETERIZATION N/A 11/03/2015   Procedure: Coronary Stent Intervention;  Surgeon: Marykay Lex, MD;  Location: [facility] INVASIVE CV LAB;  Service: Cardiovascular;  Laterality: N/A;   CARPAL TUNNEL RELEASE     CORONARY ANGIOPLASTY     1 stent   FISTULA SUPERFICIALIZATION Right 08/16/2022   Procedure: RIGHT ARTERIOVENOUS FISTULA SUPERFICIALIZATION;  Surgeon: Larina Earthly, MD;  Location: [facility];  Service: Vascular;  Laterality: Right;   TOTAL KNEE ARTHROPLASTY Right 04/02/2019   Procedure: TOTAL KNEE ARTHROPLASTY;  Surgeon: Vickki Hearing, MD;  Location: [facility];  Service: Orthopedics;  Laterality: Right;   HPI:  Jennifer [name] is a 67 y.o. female  who presented to [facility] 12/7 with acute onset complete expressive aphasia and worsening left-sided weakness. 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 PMH includes: CAD, Hypercholesteremia, HTN, Hypothyroidism, Kidney disease, Stroke  (09/2018; no changes to swallowing, speech, language, or cognition at that time), Thyroid disease, and DMII   Assessment / Plan / Recommendation Clinical Impression  Pt has acute changes to her speech, exhibting a mild dysarthria that is characterized primarily by decreased articulation. She is still fairly intelligible, although she does not have a lot of spontaneous conversation. Most of her output is at the sentence level or less, with not a lot of content offered in response to open-ended questions. Her affect is also flat and her pitch is monotone. Her expressive language appears to be fluent though with no errors in confrontational naming. She follows one- and two-step commands well and answers all yes/no questions with at least 90% accuracy. Mild difficulties were noted within delayed recall task but pt also reported  inaccurate information about history/family. Per husband, pt has a h/o dementia and he believes that she may be near her cognitive baseline. She does have some reduced awareness of acute changes s/p stroke though, which could impact safety. SLP will continue to follow for dysarthria and awareness, addressing differential diagnosis of other cognitive abilities as needed.    SLP Assessment  SLP Recommendation/Assessment: Patient needs continued Speech Lanaguage Pathology Services SLP Visit Diagnosis: Dysarthria and anarthria (R47.1);Cognitive communication deficit (R41.841)    Recommendations for follow up therapy are one component of a multi-disciplinary discharge planning process, led by the attending physician.  Recommendations may be updated based on patient status, additional functional criteria and insurance authorization.    Follow Up Recommendations  Acute inpatient rehab (3hours/day)    Assistance Recommended at Discharge  Frequent or constant Supervision/Assistance  Functional Status Assessment Patient has had a recent decline in their functional status and demonstrates the ability to make significant improvements in function in a reasonable and predictable amount of time.  Frequency and Duration min 2x/week  2 weeks      SLP Evaluation Cognition  Overall Cognitive Status: Impaired/Different from baseline Arousal/Alertness: Awake/alert Orientation Level: Oriented to person Attention: Sustained Sustained Attention: Impaired Sustained Attention Impairment: Functional basic;Verbal basic Memory: Impaired Memory Impairment: Retrieval deficit Awareness: Impaired Awareness Impairment: Intellectual impairment Safety/Judgment: Impaired       Comprehension  Auditory Comprehension Overall Auditory Comprehension: Impaired Yes/No Questions: Within Functional Limits Commands: Impaired One Step Basic Commands: 75-100% accurate Two Step Basic  Commands: 75-100% accurate Multistep Basic  Commands: 25-49% accurate Conversation: Simple    Expression Expression Primary Mode of Expression: Verbal Verbal Expression Overall Verbal Expression: Impaired Initiation: No impairment Automatic Speech: Name;Social Response Level of Generative/Spontaneous Verbalization: Sentence Naming: No impairment Pragmatics: Impairment Impairments: Abnormal affect;Eye contact;Monotone Non-Verbal Means of Communication: Not applicable   Oral / Motor  Oral Motor/Sensory Function Overall Oral Motor/Sensory Function: Severe impairment Facial ROM: Suspected CN VII (facial) dysfunction;Reduced right Facial Symmetry: Suspected CN VII (facial) dysfunction;Abnormal symmetry right Facial Strength: Reduced right;Suspected CN VII (facial) dysfunction Motor Speech Overall Motor Speech: Impaired Respiration: Within functional limits Phonation: Normal Resonance: Within functional limits Articulation: Impaired Level of Impairment: Conversation Intelligibility: Intelligible Motor Planning: Witnin functional limits            Mahala Menghini., M.A. CCC-SLP Acute Rehabilitation Services Office [phone]  Secure chat preferred  05/23/2023, 10:19 AM

## 2023-05-23 NOTE — Progress Notes (Signed)
  [facility] Progress Note   Subjective:   Patient's daughter at bedside. Pt alert and upright in bed.   Objective Vitals:   05/23/23 0357 05/23/23 0536 05/23/23 0822 05/23/23 1203  BP: (!) 142/57  (!) 130/55 (!) 141/56  Pulse: 88  86 99  Resp: 18   14  Temp: 99.1 F (37.3 C)  98.8 F (37.1 C) 98.6 F (37 C)  TempSrc: Oral  Oral Oral  SpO2: 95%  94% 100%  Weight:  85.9 kg    Height:       Physical Exam General: Awake, chronically-ill appearing, on O2, NAD Neuro: R-sided facial droop Heart: S1 and S2; No MRGs Lungs: Clear anteriorly Abdomen: Soft and non-tender Extremities: 1+bilat hip/ thigh edema Dialysis Access: R AVF (+) B/T, bruising and swelling improving   Dialysis Orders: [facility] 4x weekly Sat/Sun/Tues/Wed 3hrs 2K BFR 400 EDW 88kg RUA AVF NO heparin for now due to brainstem bleed  Assessment/Plan:  # Acute ischemic infarction s/p TNK with brainstem hemorrhage - Repeat head ct showed an acute hemorrhage in the right brainstem measuring 7X8 cm, no mass effect. TNK reversed. Neurology following and on neuro ICU.   # ESRD -  on [facility] outpatient. Doing HD MWF here. Next HD tomorrow. No heparin during treatments at this time due to #1.  # Hypertension/volume  - euvolemic on exam, 2kg under dry wt. SP IV Cleviprex, dc'd 12/09. Getting PRN hydralazine IV. No standing po BP meds.   # Anemia of CKD - Hgb 11 > 8-9. Follow. No Fe/ESA indicated at this time  # Secondary Hyperparathyroidism - CCa and phos are okay.   # Nutrition - NPO. Changed to renal diet with fluid restrictions when medically stable.  Vinson Moselle  MD  [facility] 05/23/2023, 3:05 PM  Recent Labs  Lab 05/20/23 1222 05/22/23 0419 05/23/23 0446  HGB 11.1* 8.5* 8.4*  ALBUMIN 2.9*  --   --   CALCIUM 8.8* 8.1* 7.9*  PHOS  --  6.3*  --   CREATININE 4.03* 5.68* 4.31*  K 3.5 3.2* 3.7    Inpatient medications:  atorvastatin  80 mg Oral q1800   Chlorhexidine Gluconate Cloth  6 each Topical  Q0600   feeding supplement  237 mL Oral BID BM   heparin injection (subcutaneous)  5,000 Units Subcutaneous Q8H   insulin aspart  0-6 Units Subcutaneous Q4H   memantine  10 mg Oral BID   mouth rinse  15 mL Mouth Rinse 4 times per day   pantoprazole (PROTONIX) IV  40 mg Intravenous QHS   polyvinyl alcohol  1 drop Both Eyes Q4H   senna-docusate  1 tablet Oral BID   sodium chloride flush  3 mL Intravenous Q12H    acetaminophen **OR** acetaminophen (TYLENOL) oral liquid 160 mg/5 mL **OR** acetaminophen, alteplase, artificial tears, heparin, hydrALAZINE, lidocaine (PF), lidocaine-prilocaine, ondansetron (ZOFRAN) IV, mouth rinse, pentafluoroprop-tetrafluoroeth

## 2023-05-23 NOTE — Plan of Care (Signed)
  Problem: Coping: Goal: Will verbalize positive feelings about self Outcome: Progressing   Problem: Self-Care: Goal: Ability to participate in self-care as condition permits will improve Outcome: Progressing   Problem: Self-Care: Goal: Ability to communicate needs accurately will improve Outcome: Progressing   Problem: Nutrition: Goal: Risk of aspiration will decrease Outcome: Progressing   Problem: Activity: Goal: Risk for activity intolerance will decrease Outcome: Progressing

## 2023-05-23 NOTE — Progress Notes (Signed)
STROKE TEAM PROGRESS NOTE   INTERIM HISTORY/SUBJECTIVE Daughter is at the bedside.  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 Now with dressing covered on the right eye, continue artifical tears during the day and ointment at night.  Jennifer [name] following.   OBJECTIVE  CBC    Component Value Date/Time   WBC 6.6 05/23/2023 0446   RBC 2.97 (L) 05/23/2023 0446   HGB 8.4 (L) 05/23/2023 0446   HGB 11.6 05/20/2020 1453   HCT 26.4 (L) 05/23/2023 0446   HCT 35.8 05/20/2020 1453   PLT 186 05/23/2023 0446   PLT 276 05/20/2020 1453   MCV 88.9 05/23/2023 0446   MCV 84 05/20/2020 1453   MCH 28.3 05/23/2023 0446   MCHC 31.8 05/23/2023 0446   RDW 15.3 05/23/2023 0446   RDW 13.5 05/20/2020 1453   LYMPHSABS 1.1 05/20/2023 1222   LYMPHSABS 1.7 05/20/2020 1453   MONOABS 0.4 05/20/2023 1222   EOSABS 0.1 05/20/2023 1222   EOSABS 0.1 05/20/2020 1453   BASOSABS 0.1 05/20/2023 1222   BASOSABS 0.1 05/20/2020 1453    BMET    Component Value Date/Time   NA 136 05/23/2023 0446   K 3.7 05/23/2023 0446   CL 97 (L) 05/23/2023 0446   CO2 28 05/23/2023 0446   GLUCOSE 109 (H) 05/23/2023 0446   BUN 36 (H) 05/23/2023 0446   CREATININE 4.31 (H) 05/23/2023 0446   CALCIUM 7.9 (L) 05/23/2023 0446   GFRNONAA 11 (L) 05/23/2023 0446    IMAGING past 24 hours DG Swallowing Func-Speech Pathology  Result Date: 05/22/2023 Table formatting from the original result was not included. Modified Barium Swallow Study Patient Details Name: Jennifer [name] MRN: [id number] Date of Birth: 1955-11-20 Today's Date: 05/22/2023 HPI/PMH: HPI: Jennifer [name] is a 67 y.o. female  has a past medical history of CAD (coronary artery disease), Hypercholesteremia, Hypertension, Hypothyroidism, Kidney disease, Renal disorder, Stroke (HCC) (10/2018), Thyroid disease, and Type 2 diabetes mellitus (HCC) (10/08/2018). who presents with acute onset complete expressive aphasia and worsening left-sided weakness s/p TNK  at [facility], transferred to [facility] for further care.  Most recent CT head with acute hemorrhage dorsal pons/brachium pontis. Clinical Impression: Clinical Impression: Pt has an oral more than pharyngeal dysphagia. She does not have full labial seal on her R side and does best getting liquids via straw, both in terms of getting more liquid into her mouth as well as keeping it in there. She has relatively good lingual hold and posterior transit, although movement becomes more repetitive and disorganized as boluses become more solid. She does not fully masticate a small piece of cracker, with most of it coming back out of her oral cavity. Oral residue is pretty mild across consistencies but she cannot clear a barium tablet, which she then expectorated. Her pharyngeal strength and Jennifer are intact, but she has intermittent delays in initiating her swallow wtih solid consistencies more than liquids. No aspiration occurs. Recommend starting Dys 1 (puree) diet and thin liquids. Factors that may increase risk of adverse event in presence of aspiration Jennifer [name] & Jennifer [name] 2021): Factors that may increase risk of adverse event in presence of aspiration Jennifer [name] & Jennifer [name] 2021): Limited mobility Recommendations/Plan: Swallowing Evaluation Recommendations Swallowing Evaluation Recommendations Recommendations: PO diet PO Diet Recommendation: Dysphagia 1 (Pureed); Thin liquids (Level 0) Liquid Administration via: Straw Medication Administration: Crushed with puree Supervision: Patient able to self-feed; Full supervision/cueing for swallowing strategies Swallowing strategies  : Minimize environmental distractions; Slow rate; Small  bites/sips; Check for pocketing or oral holding; Check for anterior loss Postural changes: Position pt fully upright for meals Oral care recommendations: Oral care BID (2x/day) Caregiver Recommendations: Have oral suction available Treatment Plan Treatment Plan Treatment recommendations: Therapy as  outlined in treatment plan below Follow-up recommendations: Acute inpatient rehab (3 hours/day) Functional status assessment: Patient has had a recent decline in their functional status and demonstrates the ability to make significant improvements in function in a reasonable and predictable amount of time. Treatment frequency: Min 2x/week Treatment duration: 2 weeks Interventions: Aspiration precaution training; Compensatory techniques; Patient/family education; Trials of upgraded texture/liquids; Diet toleration management by SLP Recommendations Recommendations for follow up therapy are one component of a multi-disciplinary discharge planning process, led by the attending physician.  Recommendations may be updated based on patient status, additional functional criteria and insurance authorization. Assessment: Orofacial Exam: Orofacial Exam Oral Cavity - Dentition: Missing dentition Anatomy: Anatomy: WFL Boluses Administered: Boluses Administered Boluses Administered: Thin liquids (Level 0); Mildly thick liquids (Level 2, nectar thick); Moderately thick liquids (Level 3, honey thick); Puree; Solid  Oral Impairment Domain: Oral Impairment Domain Lip Closure: Escape beyond mid-chin Tongue control during bolus hold: Posterior escape of less than half of bolus Bolus preparation/mastication: Disorganized chewing/mashing with solid pieces of bolus unchewed Bolus transport/lingual motion: Repetitive/disorganized tongue motion Oral residue: Trace residue lining oral structures Location of oral residue : Tongue; Lateral sulci Initiation of pharyngeal swallow : Pyriform sinuses  Pharyngeal Impairment Domain: Pharyngeal Impairment Domain Soft palate elevation: No bolus between soft palate (SP)/pharyngeal wall (PW) Laryngeal elevation: Complete superior movement of thyroid cartilage with complete approximation of arytenoids to epiglottic petiole Anterior hyoid excursion: Complete anterior movement Epiglottic movement: Complete  inversion Laryngeal vestibule closure: Complete, no air/contrast in laryngeal vestibule Pharyngeal stripping wave : Present - complete Pharyngeal contraction (A/P view only): N/A Pharyngoesophageal segment opening: Complete distension and complete duration, no obstruction of flow Tongue base retraction: No contrast between tongue base and posterior pharyngeal wall (PPW) Pharyngeal residue: Complete pharyngeal Jennifer Location of pharyngeal residue: N/A  Esophageal Impairment Domain: Esophageal Impairment Domain Esophageal Jennifer upright position: Complete Jennifer, esophageal coating Pill: Pill Consistency administered: Thin liquids (Level 0); Puree Thin liquids (Level 0): Impaired (see clinical impressions) Puree: Impaired (see clinical impressions) Penetration/Aspiration Scale Score: Penetration/Aspiration Scale Score 1.  Material does not enter airway: Mildly thick liquids (Level 2, nectar thick); Moderately thick liquids (Level 3, honey thick); Puree; Solid 2.  Material enters airway, remains ABOVE vocal cords then ejected out: Thin liquids (Level 0) Compensatory Strategies: No data recorded  General Information: Caregiver present: No  Diet Prior to this Study: NPO   Temperature : Normal   Respiratory Status: WFL   Supplemental O2: None (Room air)   History of Recent Intubation: No  Behavior/Cognition: Alert; Cooperative; Pleasant mood No data recorded Baseline vocal quality/speech: Hypophonia/low volume No data recorded Volitional Swallow: Able to elicit Exam Limitations: No limitations Goal Planning: Prognosis for improved oropharyngeal function: Good No data recorded No data recorded Patient/Family Stated Goal: None stated Consulted and agree with results and recommendations: Patient Pain: Pain Assessment Pain Assessment: Faces Faces Pain Scale: 0 Facial Expression: 0 Body Movements: 0 Muscle Tension: 0 Compliance with ventilator (intubated pts.): N/A Vocalization (extubated pts.): 0 CPOT Total: 0 Pain  Location: head Pain Descriptors / Indicators: Aching Pain Intervention(s): Patient requesting pain meds-RN notified End of Session: Start Time:SLP Start Time (ACUTE ONLY): 1512 Stop Time: SLP Stop Time (ACUTE ONLY): 1529 Time Calculation:SLP Time Calculation (min) (ACUTE ONLY): 17  min Charges: SLP Evaluations $ SLP Speech Visit: 1 Visit SLP Evaluations $BSS Swallow: 1 Procedure $MBS Swallow: 1 Procedure SLP visit diagnosis: SLP Visit Diagnosis: Dysphagia, unspecified (R13.10) Past Medical History: Past Medical History: Diagnosis Date  CAD (coronary artery disease)   cath 5/23 100% dist RCA lesion treated with 2 overlapping Integrity Resolute DES ( 2.25x63mm, 2.25x64mm), 60% mid RCA treated medically, 99% OM2 not amenable to PCI, 70% D1 lesion, EF normal  Hypercholesteremia   Hypertension   Hypothyroidism   Kidney disease   Renal disorder   Stroke (HCC) 10/2018  Thyroid disease   Type 2 diabetes mellitus (HCC) 10/08/2018 Past Surgical History: Past Surgical History: Procedure Laterality Date  ABDOMINAL HYSTERECTOMY    AV FISTULA PLACEMENT Right 06/28/2022  Procedure: RIGHT ARM ARTERIOVENOUS (AV) FISTULA CREATION;  Surgeon: Larina Earthly, MD;  Location: [facility];  Service: Vascular;  Laterality: Right;  CARDIAC CATHETERIZATION N/A 11/03/2015  Procedure: Left Heart Cath and Coronary Angiography;  Surgeon: Marykay Lex, MD;  [facility]: [facility] INVASIVE CV LAB;  Service: Cardiovascular;  Laterality: N/A;  CARDIAC CATHETERIZATION N/A 11/03/2015  Procedure: Coronary Stent Intervention;  Surgeon: Marykay Lex, MD;  Location: [facility] INVASIVE CV LAB;  Service: Cardiovascular;  Laterality: N/A;  CARPAL TUNNEL RELEASE    CORONARY ANGIOPLASTY    1 stent  FISTULA SUPERFICIALIZATION Right 08/16/2022  Procedure: RIGHT ARTERIOVENOUS FISTULA SUPERFICIALIZATION;  Surgeon: Larina Earthly, MD;  Location: [facility];  Service: Vascular;  Laterality: Right;  TOTAL KNEE ARTHROPLASTY Right 04/02/2019  Procedure: TOTAL KNEE ARTHROPLASTY;  Surgeon: Vickki Hearing, MD;  Location: [facility];  Service: Orthopedics;  Laterality: Right; Mahala Menghini., M.A. CCC-SLP Acute Rehabilitation Services Office [phone] Secure chat preferred 05/22/2023, 4:07 PM   Vitals:   05/23/23 0357 05/23/23 0536 05/23/23 0822 05/23/23 1203  BP: (!) 142/57  (!) 130/55 (!) 141/56  Pulse: 88  86 99  Resp: 18   14  Temp: 99.1 F (37.3 C)  98.8 F (37.1 C) 98.6 F (37 C)  TempSrc: Oral  Oral Oral  SpO2: 95%  94% 100%  Weight:  85.9 kg    Height:         PHYSICAL EXAM General:  Alert, well-nourished, well-developed patient in no acute distress Psych:  Mood and affect appropriate for situation CV: Regular rate and rhythm on monitor Respiratory:  Regular, unlabored respirations on room air GI: Abdomen soft and nontender   NEURO:  awake, alert, eyes open, orientated to age, place, time and people. No aphasia, moderate dysarthria, paucity of speech, following all simple commands. Able to name and repeat. No gaze palsy, tracking bilaterally, left gaze nystagmus, direction to the left.  Visual field full, PERRL.  Right upper and lower facial weakness. Tongue protrusion to the right. Bilateral UEs 4+/5, no drift. Bilaterally LEs 3/5, no drift. Sensation symmetrical bilaterally, b/l FTN intact grossly but slow, gait not tested.     ASSESSMENT/PLAN  Acute Ischemic Infarct s/p TNK with hemorrhagic transformation s/p reversal with TXA and Cryo, etiology: large vessel disease vs. Cardioembolic source Code Stroke CT head No acute abnormality.  ASPECTS 10.    CTA head & neck: No LVO, right ICA siphon severe stenosis, left ICA siphon moderate and left P2 moderate stenosis. Repeat CTH on admission to [facility] ICU with neuro change: Acute hemorrhage in the right dorsal pons/brachium pontis measuring 7 x 8 mm. No significant mass effect Repeat CTH stability scan: Unchanged appearance of small intraparenchymal hematoma right middle cerebellar peduncle MRI  Punctate posterior  left MCA  territory subcortical white matter infarct. Acute right dorsal brainstem hemorrhage stable. Underlying very severe chronic small vessel disease including extensive chronic microhemorrhages and small vessel infarcts throughout brain 2D Echo: LVEF 60 to 65%  VAS [place] LE DVT: Negative Consider 30-day cardiac event monitoring as outpatient LDL 102 HgbA1c 6.2 VTE prophylaxis - heparin subq aspirin 81 mg daily prior to admission, now on No antithrombotic due to hemorrhagic transformation s/p TNK.  Therapy recommendations:  Jennifer [name] Disposition:  pending  Hx of Stroke/TIA June 2019, admitted for left sided tingling, blurry vision.  MRI showed Right Thalamic infarcts and right frontoparietal white matter lacunar infarct, multiple remote lacunar infarcts seen.  MRA, carotid Doppler and 2D echo unremarkable. Aspirin increased to 162mg .  And discharged also on statin April 2020, admitted for nausea vomiting, ataxia and vertigo. MRI shows Left lateral corpus collosum, with diffusion positive lesion likely a large number of lacuanr infarct.  Extensive changes of chronic small vessel disease involving brainstem.  CT head and neck showed bilateral ICA siphon moderate stenosis.  Echo showed normal ejection fraction.  Patient was discharged home with no therapy needs.  A1c was 6.4, LDL was elevated.  She was started on Lipitor 80 mg and on DAPT. June 2024 Outpatient MRI showed chronic lacunar ischemic infarcts within right basal ganglia, bilateral thalami and bilateral cerebellar hemispheres, moderate chronic small vessel disease and mild to moderate atrophy Follows with Dr. Pearlean Brownie outpatient   Hypertension Hypotension with dialysis Home meds: Amlodipine 10 mg, carvedilol 12.5 mg Lasix 80 mg Per daughter, patient's blood pressure meds were recently lowered and she was started on midodrine before dialysis due to hypotension during dialysis treatments MRI showed extensive chronic microhemorrhages, more at brainstem,  indicating uncontrolled HTN in the past Blood Pressure Goal: SBP less than 160  Long-term BP goal normotensive  Hyperlipidemia Home meds: Lipitor 80 mg LDL 102, goal < 70 Resumed Lipitor 80 Continue statin on discharge  Diabetes type II Controlled Home meds: Humalog insulin HgbA1c 6.2, goal < 7.0 CBGs SSI Recommend close follow-up with PCP   Dysphagia Patient has post-stroke dysphagia SLP consulted Now on dysphagia 1 and thin liquid Advance diet as tolerated  Other Stroke Risk Factors Obesity, Body mass index is 29.66 kg/m., BMI >/= 30 associated with increased stroke risk, recommend weight loss, diet and exercise as appropriate  Family hx stroke (sister) Coronary artery disease Former smoker  Other Active Problems Dementia on Namenda, resumed home Namenda ESRD on hemodialysis Receives hemodialysis Tuesday, Wednesday, Saturday, Sunday at home Had HD 12/9  Hospital day # 3   Marvel Plan, MD PhD Stroke Neurology 05/23/2023 2:26 PM    To contact Stroke Continuity provider, please refer to [email or web address]. After hours, contact General Neurology

## 2023-05-23 NOTE — Progress Notes (Signed)
Inpatient Rehabilitation Admissions Coordinator   I met at bedside with patient  and daughter, Archie Patten. Her sister, Fransisco Beau does her home hemodialysis on T,W, Sat and SUN. Tonya asking of options for home with HH vs CIR. The family will discuss preference today and I will follow up. Family member can stay with her at [facility] and her dog can visit intermittently.  Ottie Glazier, RN, MSN Rehab Admissions Coordinator [phone] 05/23/2023 12:44 PM

## 2023-05-23 NOTE — Evaluation (Signed)
Occupational Therapy Evaluation Patient Details Name: Jennifer [name] MRN: [id number] DOB: 1955/07/16 Today's Date: 05/23/2023   History of Present Illness Pt is a 67 y.o. female who presented 05/20/23 with aphasia and worsening L-sided weakness (mild residual weakness from prior CVA baseline). Head CT showed no acute process. TNK administered. Later, the pt's facial droop worsened and pt began to have nausea and vomiting. Repeat head CT showed acute hemorrhage in the right dorsal pons/brachium pontis. 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 Pt currently with functional limitations due to the deficits listed below (see OT Problem List). Prior to admit, pt was living at home with husband and daughter, received assistance for BADL tasks and functional mobility at baseline. Patient will benefit from intensive inpatient follow up therapy, >3 hours/day. Pt will benefit from acute skilled OT to increase their safety and independence with ADL and functional mobility for ADL to facilitate discharge. OT will continue to follow patient acutely.         If plan is discharge home, recommend the following: Two people to help with walking and/or transfers;A lot of help with bathing/dressing/bathroom;Assist for transportation;Help with stairs or ramp for entrance    Functional Status Assessment  Patient has had a recent decline in their functional status and demonstrates the ability to make significant improvements in function in a reasonable and predictable amount of time.  Equipment Recommendations  Other (comment) (defer to next level of care)       Precautions / Restrictions Precautions Precautions: Fall;Other (comment) Precaution Comments: Goal SBP < 160 Restrictions Weight Bearing Restrictions: No      Mobility Bed Mobility    General bed  mobility comments: See PT note for bed mobility    Transfers Overall transfer level: Needs assistance Equipment used: 2 person hand held assist Transfers: Sit to/from Stand, Bed to chair/wheelchair/BSC Sit to Stand: Mod assist, From elevated surface     Step pivot transfers: +2 physical assistance, +2 safety/equipment, Mod assist     General transfer comment: Pt completed sit to stand with 1 person assisting from bed. VC for hand placement prior to sit to stand. 2 person hand held assist provided for step pivot with VC for sequencing and direction. posterior lean noted.      Balance Overall balance assessment: Needs assistance Sitting-balance support: Single extremity supported, Feet supported Sitting balance-Leahy Scale: Fair Sitting balance - Comments: EOB sitting Postural control: Left lateral lean, Posterior lean Standing balance support: Bilateral upper extremity supported, Single extremity supported, During functional activity Standing balance-Leahy Scale: Poor      ADL either performed or assessed with clinical judgement   ADL Overall ADL's : Needs assistance/impaired     Grooming: Set up;Wash/dry face;Sitting   Upper Body Bathing: Minimal assistance;Cueing for sequencing;Sitting   Lower Body Bathing: Total assistance;Cueing for sequencing;Sit to/from stand   Upper Body Dressing : Maximal assistance;Sitting   Lower Body Dressing: Total assistance;Sit to/from stand   Toilet Transfer: Minimal assistance;+2 for physical assistance;Cueing for sequencing;Cueing for safety;Stand-pivot Toilet Transfer Details (indicate cue type and reason): simulated Toileting- Clothing Manipulation and Hygiene: Total assistance;+2 for physical assistance;Sit to/from stand               Vision Baseline Vision/History: 1 Wears glasses Ability to See in Adequate Light: 0 Adequate Patient Visual Report: Other (comment) (unable to report as pt's glasses  are at home) Vision  Assessment?:  (Unable to assess vision without glasses. Right eye with gauze patch. Upon assessment, pt unable to close eye. Eyelid and eye red. Pt reports that they have been putting eye drops in.) Additional Comments: encouraged pt to have her husband bring her glasses in     Perception Perception: Impaired Preception Impairment Details: Spatial orientation Perception-Other Comments: mild left lateral lean noted   Praxis Praxis: Impaired Praxis Impairment Details: Motor planning, Organization     Pertinent Vitals/Pain Pain Assessment Pain Assessment: Faces Pain Score: 0-No pain Faces Pain Scale: No hurt     Extremity/Trunk Assessment Upper Extremity Assessment Upper Extremity Assessment: Generalized weakness;Right hand dominant;RUE deficits/detail;LUE deficits/detail RUE Deficits / Details: A/ROM is functional with strength 4-/5 grossly in shoulder, elbow, and wrist. Functional gross grasp. LUE Deficits / Details: LImited A/ROM at ~50% shoulder flexion. Elbow flexion/extension is functional. Decreased gross grasp. LUE Coordination: decreased fine motor;decreased gross motor   Lower Extremity Assessment Lower Extremity Assessment: Defer to PT evaluation   Cervical / Trunk Assessment Cervical / Trunk Assessment: Normal   Communication Communication Communication: No apparent difficulties Cueing Techniques: Verbal cues   Cognition Arousal: Alert Behavior During Therapy: Flat affect Overall Cognitive Status: Impaired/Different from baseline Area of Impairment: Attention, Following commands, Safety/judgement, Awareness, Problem solving        Following Commands: Follows one step commands consistently Safety/Judgement: Decreased awareness of safety, Decreased awareness of deficits   Problem Solving: Slow processing, Difficulty sequencing, Requires verbal cues, Requires tactile cues, Decreased initiation       General Comments  HR up to 122 with activity             Home Living Family/patient expects to be discharged to:: Private residence Living Arrangements: Spouse/significant other;Children (daughter) Available Help at Discharge: Family;Available 24 hours/day Type of Home: House Home Access: Stairs to enter;Ramped entrance Entrance Stairs-Number of Steps: 5-6 Entrance Stairs-Rails: Can reach both Home Layout: One level     Bathroom Shower/Tub: Chief Strategy Officer: Standard Bathroom Accessibility: Yes   Home Equipment: Agricultural consultant (2 wheels);Cane - single point;BSC/3in1;Shower seat;Hospital bed;Wheelchair - manual;Grab bars - tub/shower      Lives With: Spouse    Prior Functioning/Environment Prior Level of Function : Needs assist  Mobility Comments: been using WC for past month due to fear of faling and been needing assist for stand pivot transfers; mod I with RW for household distance mobility prior to that; hx of frequent falls ADLs Comments: Pt reports assist for all ADL tasks from husband. Able to help with UB ADL some.        OT Problem List: Decreased strength;Decreased range of motion;Decreased activity tolerance;Impaired balance (sitting and/or standing);Impaired vision/perception;Decreased coordination;Decreased cognition;Decreased safety awareness;Decreased knowledge of use of DME or AE;Impaired UE functional use      OT Treatment/Interventions: Self-care/ADL training;Therapeutic exercise;Neuromuscular education;Energy conservation;Modalities;Manual therapy;DME and/or AE instruction;Therapeutic activities;Cognitive remediation/compensation;Visual/perceptual remediation/compensation;Patient/family education;Balance training    OT Goals(Current goals can be found in the care plan section) Acute Rehab OT Goals Patient Stated Goal: to sit in recliner OT Goal Formulation: Patient unable to participate in goal setting Time For Goal Achievement: 06/06/23 Potential to Achieve Goals: Good  OT Frequency: Min  1X/week    Co-evaluation PT/OT/SLP Co-Evaluation/Treatment: Yes Reason for Co-Treatment: For patient/therapist safety;To address functional/ADL transfers   OT goals addressed during session: Strengthening/ROM;ADL's and self-care      AM-PAC OT "6 Clicks" Daily Activity     Outcome Measure Help from another person eating  meals?: A Little Help from another person taking care of personal grooming?: A Little Help from another person toileting, which includes using toliet, bedpan, or urinal?: Total Help from another person bathing (including washing, rinsing, drying)?: Total Help from another person to put on and taking off regular upper body clothing?: A Lot Help from another person to put on and taking off regular lower body clothing?: Total 6 Click Score: 11   End of Session Equipment Utilized During Treatment: Gait belt Nurse Communication: Mobility status  Activity Tolerance: Patient tolerated treatment well Patient left: in chair;with chair alarm set  OT Visit Diagnosis: Unsteadiness on feet (R26.81);Repeated falls (R29.6);Muscle weakness (generalized) (M62.81);History of falling (Z91.81)                Time: 1010-1035 OT Time Calculation (min): 25 min Charges:  OT General Charges $OT Visit: 1 Visit OT Evaluation $OT Eval Moderate Complexity: 1 Mod  [employer], OTR/L,CBIS  Supplemental OT - [facility] and [facility] Secure Chat Preferred    Horst Ostermiller, Charisse March 05/23/2023, 11:54 AM

## 2023-05-23 NOTE — PMR Pre-admission (Shared)
PMR Admission Coordinator Pre-Admission Assessment  Patient: Jennifer [name] is an 67 y.o., female MRN: [id number] DOB: 11-06-1955 Height: 5\' 7"  (170.2 cm) Weight: 85.9 kg  Insurance Information HMO:     PPO:      PCP:      IPA:      80/20:      OTHER:  PRIMARY: Medicare a and b      Policy#: [id number]      Subscriber: pt Benefits:  Phone #: passport one source online     Name: 12/10 Eff. Date: 04/13/2020     Deduct: %1632      Out of Pocket Max: none      Life Max: none CIR: 100%      SNF: 20 full days Outpatient: 80%     Co-Pay: 20% Home Health: 100%      Co-Pay: none DME: 80%     Co-Pay: 20% Providers: in network  SECONDARY: [employer] supplement      Policy#: [id number]     Phone#:   Financial Counselor:       Phone#:   The "Data Collection Information Summary" for patients in Inpatient Rehabilitation Facilities with attached "Privacy Act Statement-Health Care Records" was provided and verbally reviewed with: Patient and Family  Emergency Contact Information Contact Information     Name Relation Home Work Mobile   [place] Daughter [phone]  [phone]      Other Contacts     Name Relation Home Work Mobile   [place] Daughter   [phone]   [place], [place] [phone]  [phone]      Current Medical History  Patient Admitting Diagnosis: CVA  History of Present Illness: 67 year old female with medical history of CAD, HLD, HTN, hypothyroidism, CKD on home hemodialysis, 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 Transferred to [facility] on 05/20/23 form Neurology workup.  Imaging revealed acute ischemic infarct s/p TNK with hemorrhagic transformation. S/P reversal with TXA and Cryo. Etiology felt to be large vessel disease vs cardioembolic source. CTA head and neck with No LVO, right ICA siphon  severe stenosis left ICA siphon moderate and left P2 moderate stenosis. MRI punctuate posterior left MCA territory subcortical white matter infarct. Acute right dorsal brainstem hemorrhage stable. Underlying very severe chronic small vessel disease including extensive chronic micro hemorrhages and small vessel infarcts through out brain. 2 d echo with EF 60 to 65 %. Considering 30 day cardiac event monitor. VTE prophylaxis heparin sq. ASA prior to admit. Now on no antithrombotic due to hemorrhagic transformation.   Home med for HTN of Amlodipine, carvedilol and lasix. Recent issue with BP and began midodrine before dialysis due to hypotension during hemodialysis treatments. HLD on Lipitor with LDL 102. To resume. Home meds of Humalog with Hgb A1c 6.2, CBGS and SSI and f/u as OP. SLP consulted for dysphagia and on D 1 diet with thin liquids. Resumed home Namenda for dementia. Nephrology consulted to manage hemodialysis treatments.   Complete NIHSS TOTAL: 9  [facility] has been reviewed by the rehabilitation admission coordinator and physician.  Past Medical History  Past Medical History:  Diagnosis Date   CAD (coronary artery disease)    cath 5/23 100% dist RCA lesion treated with 2 overlapping Integrity Resolute DES ( 2.25x30mm, 2.25x25mm), 60% mid RCA treated medically, 99%  OM2 not amenable to PCI, 70% D1 lesion, EF normal   Hypercholesteremia    Hypertension    Hypothyroidism    Kidney disease    Renal disorder    Stroke [facility]) 10/2018   Thyroid disease    Type 2 diabetes mellitus (HCC) 10/08/2018   Has the patient had major surgery during 100 days prior to admission? Yes  Family History   family history includes Heart attack in her brother; Heart disease in her mother; Stroke in her sister.  Current Medications  Current Facility-Administered Medications:    acetaminophen (TYLENOL) tablet 650 mg, 650 mg, Oral, Q4H PRN **OR** acetaminophen  (TYLENOL) 160 MG/5ML solution 650 mg, 650 mg, Per Tube, Q4H PRN **OR** acetaminophen (TYLENOL) suppository 650 mg, 650 mg, Rectal, Q4H PRN, Pamalee Leyden, Devon, NP, 650 mg at 05/22/23 1404   alteplase (CATHFLO ACTIVASE) injection 2 mg, 2 mg, Intracatheter, Once PRN, Berenda Morale, NP   artificial tears (LACRILUBE) ophthalmic ointment, , Both Eyes, QHS PRN, Hetty Blend C, NP, Given at 05/23/23 1013   atorvastatin (LIPITOR) tablet 80 mg, 80 mg, Oral, q1800, Marvel Plan, MD   Chlorhexidine Gluconate Cloth 2 % PADS 6 each, 6 each, Topical, Q0600, Berenda Morale, NP, 6 each at 05/23/23 0539   feeding supplement (ENSURE ENLIVE / ENSURE PLUS) liquid 237 mL, 237 mL, Oral, BID BM, Daria Pastures, MD, 237 mL at 05/23/23 1541   heparin injection 1,000 Units, 1,000 Units, Dialysis, PRN, Salome Holmes E, NP   heparin injection 5,000 Units, 5,000 Units, Subcutaneous, Q8H, Marvel Plan, MD, 5,000 Units at 05/23/23 1538   hydrALAZINE (APRESOLINE) injection 10-20 mg, 10-20 mg, Intravenous, Q4H PRN, Hetty Blend C, NP, 10 mg at 05/21/23 1744   insulin aspart (novoLOG) injection 0-6 Units, 0-6 Units, Subcutaneous, Q4H, Shafer, Devon, NP, 1 Units at 05/23/23 1644   lidocaine (PF) (XYLOCAINE) 1 % injection 5 mL, 5 mL, Intradermal, PRN, Salome Holmes E, NP   lidocaine-prilocaine (EMLA) cream 1 Application, 1 Application, Topical, PRN, Berenda Morale, NP   memantine [facility]) tablet 10 mg, 10 mg, Oral, BID, Marvel Plan, MD, 10 mg at 05/23/23 1013   ondansetron (ZOFRAN) injection 4 mg, 4 mg, Intravenous, Q6H PRN, Pamalee Leyden, Ludger Nutting, NP, 4 mg at 05/22/23 0815   Oral care mouth rinse, 15 mL, Mouth Rinse, 4 times per day, Marvel Plan, MD, 15 mL at 05/23/23 1646   Oral care mouth rinse, 15 mL, Mouth Rinse, PRN, Marvel Plan, MD   pantoprazole (PROTONIX) injection 40 mg, 40 mg, Intravenous, QHS, Shafer, Devon, NP, 40 mg at 05/22/23 2136   pentafluoroprop-tetrafluoroeth (GEBAUERS) aerosol 1 Application, 1  Application, Topical, PRN, Berenda Morale, NP   polyvinyl alcohol (LIQUIFILM TEARS) 1.4 % ophthalmic solution 1 drop, 1 drop, Both Eyes, Q4H, Marvel Plan, MD, 1 drop at 05/23/23 1543   senna-docusate (Senokot-S) tablet 1 tablet, 1 tablet, Oral, BID, Shafer, Devon, NP, 1 tablet at 05/23/23 1013   sodium chloride flush (NS) 0.9 % injection 3 mL, 3 mL, Intravenous, Q12H, Oretha Milch, MD, 3 mL at 05/23/23 1013  Patients Current Diet:  Diet Order             DIET - DYS 1 Room service appropriate? No; Fluid consistency: Thin  Diet effective now                  Precautions / Restrictions Precautions Precautions: Fall, Other (comment) Precaution Comments: Goal SBP < 160 Restrictions Weight Bearing Restrictions: No   Has the patient  had 2 or more falls or a fall with injury in the past year? No  Prior Activity Level Limited Community (1-2x/wk): progressive worsening of funcion, was Mod I with RW with asisst with ADLs, now using w/c  Prior Functional Level Self Care: Did the patient need help bathing, dressing, using the toilet or eating? Needed some help  Indoor Mobility: Did the patient need assistance with walking from room to room (with or without device)? Needed some help  Stairs: Did the patient need assistance with internal or external stairs (with or without device)? Needed some help  Functional Cognition: Did the patient need help planning regular tasks such as shopping or remembering to take medications? Needed some help  Patient Information Are you of Hispanic, Latino/a,or Spanish origin?: A. No, not of Hispanic, Latino/a, or Spanish origin (per daughter) What is your race?: A. White (per dtr) Do you need or want an interpreter to communicate with a doctor or health care staff?: 9. Unable to respond  Patient's Response To:  Health Literacy and Transportation Is the patient able to respond to health literacy and transportation needs?: No Health Literacy - How  often do you need to have someone help you when you read instructions, pamphlets, or other written material from your doctor or pharmacy?: Never (per dtr) In the past 12 months, has lack of transportation kept you from medical appointments or from getting medications?: No (per dtr) In the past 12 months, has lack of transportation kept you from meetings, work, or from getting things needed for daily living?: No (per dtr)  Home Assistive Devices / Equipment Home Equipment: Agricultural consultant (2 wheels), [place] - single point, BSC/3in1, [employer], Hospital bed, Wheelchair - manual, Grab bars - tub/shower  Prior Device Use: Indicate devices/aids used by the patient prior to current illness, exacerbation or injury? Manual wheelchair and Walker  Current Functional Level Cognition  Arousal/Alertness: Awake/alert Overall Cognitive Status: Impaired/Different from baseline Current Attention Level: Selective Orientation Level: Oriented to person Following Commands: Follows one step commands consistently Safety/Judgement: Decreased awareness of safety, Decreased awareness of deficits General Comments: Pt unaware of her L lateral lean, needing repeated cues to correct and maintain midline. Needed multi-modal cues to sequence taking steps along EOB. Pt unaware of her dizziness, even denying it while attempting to have bouts of emesis, so decreased insight into her deficits Attention: Sustained Sustained Attention: Impaired Sustained Attention Impairment: Functional basic, Verbal basic Memory: Impaired Memory Impairment: Retrieval deficit Awareness: Impaired Awareness Impairment: Intellectual impairment Safety/Judgment: Impaired    Extremity Assessment (includes Sensation/Coordination)  Upper Extremity Assessment: Generalized weakness, Right hand dominant, RUE deficits/detail, LUE deficits/detail RUE Deficits / Details: A/ROM is functional with strength 4-/5 grossly in shoulder, elbow, and wrist.  Functional gross grasp. LUE Deficits / Details: LImited A/ROM at ~50% shoulder flexion. Elbow flexion/extension is functional. Decreased gross grasp. LUE Coordination: decreased fine motor, decreased gross motor  Lower Extremity Assessment: Defer to PT evaluation RLE Deficits / Details: R leg weaker than L with R MMT scores of 3+ to 4- grossly, some baseline weakness since TKA (R weaker than L at baseline); hx of peripheral neuropathy bil, pt denies any acute sensation changes RLE Sensation: history of peripheral neuropathy LLE Deficits / Details: mild residual L weakness from prior CVA, but L was stronger than R at baseline; MMT scores of grossly 4; hx of peripheral neuropathy bil, pt denies any acute sensation changes LLE Sensation: history of peripheral neuropathy    ADLs  Overall ADL's : Needs assistance/impaired  Grooming: Set up, Wash/dry face, Sitting Upper Body Bathing: Minimal assistance, Cueing for sequencing, Sitting Lower Body Bathing: Total assistance, Cueing for sequencing, Sit to/from stand Upper Body Dressing : Maximal assistance, Sitting Lower Body Dressing: Total assistance, Sit to/from stand Toilet Transfer: Minimal assistance, +2 for physical assistance, Cueing for sequencing, Cueing for safety, Stand-pivot Toilet Transfer Details (indicate cue type and reason): simulated Toileting- Clothing Manipulation and Hygiene: Total assistance, +2 for physical assistance, Sit to/from stand    Mobility  Overal bed mobility: Needs Assistance Bed Mobility: Supine to Sit Supine to sit: Mod assist, HOB elevated Sit to supine: Mod assist, +2 for safety/equipment General bed mobility comments: See PT note for bed mobility    Transfers  Overall transfer level: Needs assistance Equipment used: 2 person hand held assist Transfers: Sit to/from Stand, Bed to chair/wheelchair/BSC Sit to Stand: Mod assist, From elevated surface Bed to/from chair/wheelchair/BSC transfer type:: Step  pivot Step pivot transfers: +2 physical assistance, +2 safety/equipment, Mod assist General transfer comment: Pt completed sit to stand with 1 person assisting from bed. VC for hand placement prior to sit to stand. 2 person hand held assist provided for step pivot with VC for sequencing and direction. posterior lean noted.    Ambulation / Gait / Stairs / Wheelchair Mobility  Ambulation/Gait Ambulation/Gait assistance: Max Chemical engineer (Feet): 2 Feet Assistive device: 1 person hand held assist Gait Pattern/deviations: Step-to pattern, Decreased step length - right, Decreased step length - left, Decreased stride length, Decreased weight shift to right, Decreased weight shift to left, Trunk flexed General Gait Details: mod/max assist pivot steps only Gait velocity: reduced Gait velocity interpretation: <1.31 ft/sec, indicative of household ambulator    Posture / Balance Dynamic Sitting Balance Sitting balance - Comments: EOB sitting Balance Overall balance assessment: Needs assistance Sitting-balance support: Single extremity supported, Feet supported Sitting balance-Leahy Scale: Fair Sitting balance - Comments: EOB sitting Postural control: Left lateral lean, Posterior lean Standing balance support: Bilateral upper extremity supported, Single extremity supported, During functional activity Standing balance-Leahy Scale: Poor Standing balance comment: L lean, modA for static standing balance, mod/maxA to step    Special needs/care consideration Hemo Hemodialysis performed by Dtr, Shanon, Hgb A1c 6.2   Previous Home Environment  Living Arrangements: Spouse/significant other, Children (daughter)  Lives With: Spouse Available Help at Discharge: Family, Available 24 hours/day Type of Home: House Home Layout: One level Home Access: Stairs to enter, Ramped entrance Entrance Stairs-Rails: Can reach both Entrance Stairs-Number of Steps: 5-6 Bathroom Shower/Tub: Arts development officer: Yes Home Care Services: No Additional Comments: recent completion with dtr, [name], who does her home hemodialysis 4 days per week,t,w,sat and Sun  Discharge Living Setting Plans for Discharge Living Setting: Patient's home, House, Lives with (comment) (spouse) Type of Home at Discharge: House Discharge Home Layout: One level Discharge Home Access: Stairs to enter, Ramped entrance Entrance Stairs-Rails: Right, Left, Can reach both Entrance Stairs-Number of Steps: 5 to 6 Discharge Bathroom Shower/Tub: Tub/shower unit Discharge Bathroom Toilet: Standard Discharge Bathroom Accessibility: Yes How Accessible: Accessible via walker Does the patient have any problems obtaining your medications?: No  Social/Family/Support Systems Patient Roles: Spouse, Parent Contact Information: daughter Shanon Anticipated Caregiver: daughters, spouse Anticipated Caregiver's Contact Information: see contacts Ability/Limitations of Caregiver: no limitations Caregiver Availability: 24/7 Discharge Plan Discussed with Primary Caregiver: Yes Is Caregiver In Agreement with Plan?: Yes Does Caregiver/Family have Issues with Lodging/Transportation while Pt is in Rehab?: No  Goals Patient/Family Goal for Rehab: supervision to  min asisst with PT, OT and SLP Expected length of stay: ELOS 10 to 14 days Additional Information: Home Hemodialysis Pt/Family Agrees to Admission and willing to participate: Yes Program Orientation Provided & Reviewed with Pt/Caregiver Including Roles  & Responsibilities: Yes  Decrease burden of Care through IP rehab admission: n/a  Possible need for SNF placement upon discharge: not anticipated  Patient Condition: I have reviewed medical records from [facility] and [facility], spoken with  patient, daughter, and family member. I met with patient at the bedside for inpatient rehabilitation assessment.  Patient will benefit from  ongoing PT, OT, and SLP, can actively participate in 3 hours of therapy a day 5 days of the week, and can make measurable gains during the admission.  Patient will also benefit from the coordinated team approach during an Inpatient Acute Rehabilitation admission.  The patient will receive intensive therapy as well as Rehabilitation physician, nursing, social worker, and care management interventions.  Due to bladder management, bowel management, safety, skin/wound care, disease management, medication administration, pain management, and patient education the patient requires 24 hour a day rehabilitation nursing.  The patient is currently *** with mobility and basic ADLs.  Discharge setting and therapy post discharge at home with home health is anticipated.  Patient has agreed to participate in the Acute Inpatient Rehabilitation Program and will admit today.  Preadmission Screen Completed By:  Clois Dupes, RN MSN12/03/2023 5:26 PM ______________________________________________________________________   Discussed status with Dr. Marland Kitchen on *** at *** and received approval for admission today.  Admission Coordinator:  Clois Dupes, RN MSN time Marland KitchenDorna Bloom ***   Assessment/Plan: Diagnosis: Does the need for close, 24 hr/day Medical supervision in concert with the patient's rehab needs make it unreasonable for this patient to be served in a less intensive setting? {yes_no_potentially:3041433} Co-Morbidities requiring supervision/potential complications: *** Due to {due ZO:1096045}, does the patient require 24 hr/day rehab nursing? {yes_no_potentially:3041433} Does the patient require coordinated care of a physician, rehab nurse, PT, OT, and SLP to address physical and functional deficits in the context of the above medical diagnosis(es)? {yes_no_potentially:3041433} Addressing deficits in the following areas: {deficits:3041436} Can the patient actively participate in an intensive therapy  program of at least 3 hrs of therapy 5 days a week? {yes_no_potentially:3041433} The potential for patient to make measurable gains while on inpatient rehab is {potential:3041437} Anticipated functional outcomes upon discharge from inpatient rehab: {functional outcomes:304600100} PT, {functional outcomes:304600100} OT, {functional outcomes:304600100} SLP Estimated rehab length of stay to reach the above functional goals is: *** Anticipated discharge destination: {anticipated dc setting:21604} 10. Overall Rehab/Functional Prognosis: {potential:3041437}   MD Signature: ***

## 2023-05-23 NOTE — Care Management Important Message (Signed)
Important Message  Patient Details  Name: Jennifer [name] MRN: [id number] Date of Birth: February 09, 1956   Important Message Given:  Yes - Medicare IM     Dorena Bodo 05/23/2023, 2:34 PM

## 2023-05-23 NOTE — Progress Notes (Signed)
Physical Therapy Treatment Patient Details Name: Jennifer [name] MRN: [id number] DOB: September 29, 1955 Today's Date: 05/23/2023   History of Present Illness Pt is a 67 y.o. female who presented 05/20/23 with aphasia and worsening L-sided weakness (mild residual weakness from prior CVA baseline). Head CT showed no acute process. TNK administered. Later, the pt's facial droop worsened and pt began to have nausea and vomiting. Repeat head CT showed acute hemorrhage in the right dorsal pons/brachium pontis. PMH: CAD, HTN, hypothyroidism, CVA in 2020 with residual mild left-sided weakness, thyroid disease, CKD, renal disorder, DM2    PT Comments  Pt required mod assist bed mobility, mod assist sit to stand, and +2 mod assist SPT. She demo fair sitting balance and poor standing balance. HR up to 122 during activity. Pt in recliner with feet elevated at end of session.     If plan is discharge home, recommend the following: Two people to help with walking and/or transfers;A lot of help with bathing/dressing/bathroom;Assistance with cooking/housework;Direct supervision/assist for financial management;Direct supervision/assist for medications management;Assist for transportation;Help with stairs or ramp for entrance   Can travel by private vehicle        Equipment Recommendations  None recommended by PT    Recommendations for Other Services       Precautions / Restrictions Precautions Precautions: Fall;Other (comment) Precaution Comments: Goal SBP < 160     Mobility  Bed Mobility Overal bed mobility: Needs Assistance Bed Mobility: Supine to Sit     Supine to sit: Mod assist, HOB elevated     General bed mobility comments: assist with trunk and BLE. Increased time. Cues for sequencing.    Transfers Overall transfer level: Needs assistance Equipment used: 1 person hand held assist, 2 person hand held assist Transfers: Sit to/from Stand, Bed to chair/wheelchair/BSC Sit to Stand: Mod assist    Step pivot transfers: +2 physical assistance, +2 safety/equipment, Mod assist       General transfer comment: assist on L to power up to stand. Pivot steps toward R bed to recliner with +2 HHA. Wide BOS. Cues for sequencing. Pt tending to continuall step with RLE, leaving LLE to lag behind.    Ambulation/Gait                   Stairs             Wheelchair Mobility     Tilt Bed    Modified Rankin (Stroke Patients Only) Modified Rankin (Stroke Patients Only) Pre-Morbid Rankin Score: Moderately severe disability Modified Rankin: Severe disability     Balance Overall balance assessment: Needs assistance Sitting-balance support: Single extremity supported, Feet supported Sitting balance-Leahy Scale: Fair     Standing balance support: Bilateral upper extremity supported, Single extremity supported, During functional activity Standing balance-Leahy Scale: Poor                              Cognition Arousal: Alert Behavior During Therapy: Flat affect Overall Cognitive Status: Impaired/Different from baseline Area of Impairment: Attention, Following commands, Safety/judgement, Awareness, Problem solving                   Current Attention Level: Selective   Following Commands: Follows one step commands consistently Safety/Judgement: Decreased awareness of safety, Decreased awareness of deficits Awareness: Emergent Problem Solving: Slow processing, Difficulty sequencing, Requires verbal cues, Requires tactile cues          Exercises  General Comments General comments (skin integrity, edema, etc.): HR up to 122 with activity      Pertinent Vitals/Pain Pain Assessment Pain Assessment: No/denies pain Faces Pain Scale: No hurt    Home Living     Available Help at Discharge: Family;Available 24 hours/day Type of Home: House                  Prior Function            PT Goals (current goals can now be found in  the care plan section) Acute Rehab PT Goals Patient Stated Goal: to improve Progress towards PT goals: Progressing toward goals    Frequency    Min 1X/week      PT Plan      Co-evaluation              AM-PAC PT "6 Clicks" Mobility   Outcome Measure  Help needed turning from your back to your side while in a flat bed without using bedrails?: A Lot Help needed moving from lying on your back to sitting on the side of a flat bed without using bedrails?: A Lot Help needed moving to and from a bed to a chair (including a wheelchair)?: Total Help needed standing up from a chair using your arms (e.g., wheelchair or bedside chair)?: A Lot Help needed to walk in hospital room?: Total Help needed climbing 3-5 steps with a railing? : Total 6 Click Score: 9    End of Session Equipment Utilized During Treatment: Gait belt Activity Tolerance: Patient tolerated treatment well Patient left: in chair;with call bell/phone within reach;with chair alarm set Nurse Communication: Mobility status PT Visit Diagnosis: Unsteadiness on feet (R26.81);Other abnormalities of gait and mobility (R26.89);Difficulty in walking, not elsewhere classified (R26.2);Other symptoms and signs involving the nervous system (R29.898);Dizziness and giddiness (R42);History of falling (Z91.81);Muscle weakness (generalized) (M62.81);Repeated falls (R29.6)     Time: 1010-1035 PT Time Calculation (min) (ACUTE ONLY): 25 min  Charges:    $Therapeutic Activity: 8-22 mins PT General Charges $$ ACUTE PT VISIT: 1 Visit                     Ferd Glassing., PT  Office # [phone]    Ilda Foil 05/23/2023, 11:41 AM

## 2023-05-23 NOTE — Progress Notes (Signed)
Speech Language Pathology Treatment: Dysphagia  Patient Details Name: Jennifer [name] MRN: [id number] DOB: March 04, 1956 Today's Date: 05/23/2023 Time: 4098-1191 SLP Time Calculation (min) (ACUTE ONLY): 16 min  Assessment / Plan / Recommendation Clinical Impression  Pt consumed purees and thin liquids with Min cues for slower pacing but no overt s/s of aspiration. Anterior loss is not observed, but pt says she has been experiencing this at her meals with liquids in particular. She says she is learning to put the straw in the L side of her mouth to try to reduce loss. Recommend continuing current diet for now with ongoing SLP f/u and advancement as able.    HPI HPI: Jennifer [name] is a 67 y.o. female  who presented to [facility] 12/7 with acute onset complete expressive aphasia and worsening left-sided weakness. 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 PMH includes: CAD, Hypercholesteremia, HTN, Hypothyroidism, Kidney disease, Stroke  (09/2018; no changes to swallowing, speech, language, or cognition at that time), Thyroid disease, and DMII      SLP Plan  Continue with current plan of care      Recommendations for follow up therapy are one component of a multi-disciplinary discharge planning process, led by the attending physician.  Recommendations may be updated based on patient status, additional functional criteria and insurance authorization.    Recommendations  Diet recommendations: Dysphagia 1 (puree);Thin liquid Liquids provided via: Straw Medication Administration: Crushed with puree Supervision: Patient able to self feed;Full supervision/cueing for compensatory strategies Compensations: Slow rate;Small sips/bites;Monitor for anterior loss Postural Changes and/or Swallow Maneuvers: Seated upright 90 degrees                  Oral care BID   Frequent or constant Supervision/Assistance Dysphagia, unspecified (R13.10)      Continue with current plan of care     Mahala Menghini., M.A. CCC-SLP Acute Rehabilitation Services Office [phone]  Secure chat preferred   05/23/2023, 9:36 AM

## 2023-05-23 NOTE — Plan of Care (Signed)
  Problem: Education: Goal: Knowledge of disease or condition will improve Outcome: Progressing   Problem: Health Behavior/Discharge Planning: Goal: Ability to manage health-related needs will improve Outcome: Progressing Goal: Goals will be collaboratively established with patient/family Outcome: Progressing   Problem: Self-Care: Goal: Ability to communicate needs accurately will improve Outcome: Progressing   Problem: Nutrition: Goal: Risk of aspiration will decrease Outcome: Progressing Goal: Dietary intake will improve Outcome: Progressing   Problem: Clinical Measurements: Goal: Ability to maintain clinical measurements within normal limits will improve Outcome: Progressing Goal: Will remain free from infection Outcome: Progressing Goal: Diagnostic test results will improve Outcome: Progressing Goal: Respiratory complications will improve Outcome: Progressing Goal: Cardiovascular complication will be avoided Outcome: Progressing   Problem: Activity: Goal: Risk for activity intolerance will decrease Outcome: Progressing   Problem: Nutrition: Goal: Adequate nutrition will be maintained Outcome: Progressing   Problem: Elimination: Goal: Will not experience complications related to bowel motility Outcome: Progressing Goal: Will not experience complications related to urinary retention Outcome: Progressing   Problem: Pain Management: Goal: General experience of comfort will improve Outcome: Progressing   Problem: Safety: Goal: Ability to remain free from injury will improve Outcome: Progressing   Problem: Skin Integrity: Goal: Risk for impaired skin integrity will decrease Outcome: Progressing   Problem: Nutritional: Goal: Maintenance of adequate nutrition will improve Outcome: Progressing   Problem: Skin Integrity: Goal: Risk for impaired skin integrity will decrease Outcome: Progressing   Problem: Tissue Perfusion: Goal: Adequacy of tissue perfusion  will improve Outcome: Progressing

## 2023-05-24 ENCOUNTER — Other Ambulatory Visit: Payer: Self-pay | Admitting: Neurology

## 2023-05-24 ENCOUNTER — Other Ambulatory Visit: Payer: Self-pay | Admitting: Student

## 2023-05-24 DIAGNOSIS — I639 Cerebral infarction, unspecified: Secondary | ICD-10-CM

## 2023-05-24 LAB — CBC
HCT: 27.8 % — ABNORMAL LOW (ref 36.0–46.0)
Hemoglobin: 8.8 g/dL — ABNORMAL LOW (ref 12.0–15.0)
MCH: 27.9 pg (ref 26.0–34.0)
MCHC: 31.7 g/dL (ref 30.0–36.0)
MCV: 88.3 fL (ref 80.0–100.0)
Platelets: 198 10*3/uL (ref 150–400)
RBC: 3.15 MIL/uL — ABNORMAL LOW (ref 3.87–5.11)
RDW: 15.1 % (ref 11.5–15.5)
WBC: 8.9 10*3/uL (ref 4.0–10.5)
nRBC: 0 % (ref 0.0–0.2)

## 2023-05-24 LAB — BASIC METABOLIC PANEL
Anion gap: 8 (ref 5–15)
BUN: 54 mg/dL — ABNORMAL HIGH (ref 8–23)
CO2: 28 mmol/L (ref 22–32)
Calcium: 8.1 mg/dL — ABNORMAL LOW (ref 8.9–10.3)
Chloride: 101 mmol/L (ref 98–111)
Creatinine, Ser: 5.92 mg/dL — ABNORMAL HIGH (ref 0.44–1.00)
GFR, Estimated: 7 mL/min — ABNORMAL LOW (ref >60)
Glucose, Bld: 115 mg/dL — ABNORMAL HIGH (ref 70–99)
Potassium: 4.1 mmol/L (ref 3.5–5.1)
Sodium: 137 mmol/L (ref 135–145)

## 2023-05-24 LAB — GLUCOSE, CAPILLARY
Glucose-Capillary: 115 mg/dL — ABNORMAL HIGH (ref 70–99)
Glucose-Capillary: 96 mg/dL (ref 70–99)

## 2023-05-24 MED ORDER — LIDOCAINE HCL (PF) 1 % IJ SOLN: 5.0000 mL | INTRAMUSCULAR | Status: DC | PRN

## 2023-05-24 MED ORDER — HEPARIN SODIUM (PORCINE) 1000 UNIT/ML DIALYSIS: 1000.0000 [IU] | INTRAMUSCULAR | Status: DC | PRN

## 2023-05-24 MED ORDER — CARVEDILOL 12.5 MG PO TABS: 12.5000 mg | ORAL_TABLET | Freq: Every day | ORAL | 1 refills | Status: DC

## 2023-05-24 MED ORDER — INSULIN ASPART 100 UNIT/ML IJ SOLN: 0.0000 [IU] | Freq: Three times a day (TID) | INTRAMUSCULAR | Status: DC

## 2023-05-24 MED ORDER — ASPIRIN 81 MG PO CHEW: 81.0000 mg | CHEWABLE_TABLET | Freq: Every day | ORAL | 2 refills | Status: DC

## 2023-05-24 MED ORDER — NEPRO/CARBSTEADY PO LIQD: 237.0000 mL | ORAL | Status: DC | PRN

## 2023-05-24 MED ORDER — LIDOCAINE-PRILOCAINE 2.5-2.5 % EX CREA: 1.0000 | TOPICAL_CREAM | CUTANEOUS | Status: DC | PRN

## 2023-05-24 MED ORDER — BISACODYL 10 MG RE SUPP
10.0000 mg | Freq: Every day | RECTAL | Status: DC | PRN
  Filled 2023-05-24: qty 1

## 2023-05-24 MED ORDER — POLYVINYL ALCOHOL 1.4 % OP SOLN: 1.0000 [drp] | OPHTHALMIC | 0 refills | Status: DC

## 2023-05-24 MED ORDER — ANTICOAGULANT SODIUM CITRATE 4% (200MG/5ML) IV SOLN: 5.0000 mL | Status: DC | PRN

## 2023-05-24 MED ORDER — ARTIFICIAL TEARS OPHTHALMIC OINT: TOPICAL_OINTMENT | Freq: Every evening | OPHTHALMIC | 2 refills | Status: DC | PRN

## 2023-05-24 MED ORDER — ASPIRIN 81 MG PO CHEW
81.0000 mg | CHEWABLE_TABLET | Freq: Every day | ORAL | Status: DC
  Administered 2023-05-24: 81 mg via ORAL
  Filled 2023-05-24: qty 1

## 2023-05-24 MED ORDER — MEMANTINE HCL 10 MG PO TABS: 5.0000 mg | ORAL_TABLET | Freq: Two times a day (BID) | ORAL | 1 refills | Status: DC

## 2023-05-24 MED ORDER — PENTAFLUOROPROP-TETRAFLUOROETH EX AERO: 1.0000 | INHALATION_SPRAY | CUTANEOUS | Status: DC | PRN

## 2023-05-24 NOTE — Progress Notes (Signed)
  Inpatient Rehabilitation Admissions Coordinator   Met at bedside with patient , spouse, daughter, Almira Coaster and [place], NP. Gina requesting discharge home with [facility] . I contacted RN CM, Kelli and we will sign off.  Ottie Glazier, RN, MSN Rehab Admissions Coordinator [phone] 05/24/2023 1:58 PM

## 2023-05-24 NOTE — Patient Instructions (Signed)
Dysphagia Eating Plan, Pureed This eating plan helps people who are not able to bite or chew food. It is also for people who do not have good control of their tongues. Pureed foods are smooth. They are prepared without lumps so that they can be swallowed safely. Work with your health care provider, nutrition and diet specialist (dietitian), or your speech-language pathologist to make sure you are following the eating plan safely and getting all the nutrients you need. What are tips for following this plan? Cooking If a food is not originally a smooth texture, you may be able to eat the food after: Pureeing it. This can be done with a blender. Moistening it. This can be done by adding juice, cooking liquid, gravy, or sauce to a dry food and then pureeing it. For example, you may have bread if you soak it in milk and puree it. If a food is too thin, you may add a commercial thickener, corn starch, rice cereal, or potato flakes to thicken it. Strain and throw away any excess liquid or liquid that separates from a solid pureed food before eating. Strain lumps, chunks, pulp, and seeds from pureed foods before eating. Reheat foods slowly to prevent a tough crust from forming. Meal planning Eat a variety of foods to get all the nutrients you need. Add dry milk or protein powder to food to increase calories and protein content. Follow your meal plan as told by your dietitian. General information You may eat foods that are soft and have a pudding-like texture. Do not eat foods that you have to chew. If you have to chew the food, then you cannot eat it. Avoid foods that are hard, dry, sticky, chunky, lumpy, or stringy. Also avoid foods with nuts, seeds, raisins, skins, or pulp. You may be instructed to thicken liquids. Follow your health care provider's instructions about how to do this and to what consistency. The foods you may eat are usually eaten with a spoon. You can use a spoon or fork to test the  texture of a food: Using a spoon, you can do a spoon tilt test to check if food holds together on the spoon and is not too firm, too sticky, or too thick. Food should hold its shape on the spoon and slide off easily with almost no food left on the spoon. With food on a fork, the food should sit on a mound or pile on top of the fork and should not seep or drip through the fork prongs continuously. What foods should I eat?  Fruits Pureed fruits such as melons and apples without seeds or pulp. Mashed bananas. Mashed avocado. Fruit juices without pulp or seeds. Vegetables Pureed vegetables. Smooth tomato paste or sauce. Mashed or pureed potatoes without skin. Grains Soft breads, pancakes, [place] toast, muffins, and bread stuffing pureed to a smooth, moist texture, without nuts or seeds. Cooked cereals that have a pudding-like consistency, such as hot wheat cereal or farina. Pureed oatmeal. Pureed, well-cooked pasta and rice. Meats and other proteins Pureed meat, poultry, and fish. Smooth pate or liverwurst. Smooth souffles. Pureed beans such as lentils. Pureed eggs. Smooth nut and seed butters. Pureed tofu. Dairy Yogurt. Milk. Pureed cottage cheese. Nutritional dairy drinks or shakes. Cream cheese. Smooth pudding, ice cream, sherbet, and malts. Fats and oils Butter. Margarine. Vegetable oils. Smooth and strained gravy. Sour cream. Mayonnaise. Smooth sauces such as white sauce, cheese sauce, or hollandaise sauce. Sweets and desserts Moistened and pureed cookies and cakes. Whipped  topping. Gelatin. Pudding pops. Seasonings and other foods Finely ground spices. Jelly. Honey. Pureed casseroles. Strained soups. Pureed sandwiches. Beverages Anything prepared at the consistency recommended by your health care provider. The items listed above may not be a complete list of foods and beverages you can eat. Contact a dietitian for more information. What foods should I avoid? Fruits Whole fresh, frozen,  canned, or dried fruits that have not been pureed. Stringy fruits, such as pineapple or coconut. Watermelon with seeds. Dried fruit or fruit leather. Vegetables Whole vegetables. Stringy vegetables such as celery. Tomatoes or tomato sauce with seeds. Fried vegetables. Grains Oatmeal. Dry cereals. Hard breads. Breads with seeds or nuts. Whole pasta, rice, or other grains. Whole pancakes, waffles, biscuits, muffins, or rolls. Meats and other proteins Whole or ground meat, fish, or poultry. Dried or cooked lentils or legumes that have been cooked but not mashed or pureed. Non-pureed eggs. Nuts and seeds. Crunchy peanut butter. Whole tofu or other meat alternatives. Dairy Cheese cubes or slices. Non-pureed cottage cheese. Yogurt with fruit chunks. Fats and oils All fats and sauces that have lumps or chunks. Sweets and desserts Solid desserts. Sticky, chewy sweets such as licorice and caramel. Candy with nuts or coconut. Seasonings and other foods Coarse or seeded herbs and spices. Chunky preserves. Jams with seeds. Whole sandwiches. Non-pureed casseroles. Chunky soups. The items listed above may not be a complete list of foods and beverages you should avoid. Contact a dietitian for more information. Summary Pureed foods can be helpful for those who have difficulty chewing or problems controlling or moving food with their tongues. On this dysphagia eating plan, you may eat foods that are soft and have a pudding-like texture. Do not eat foods that you have to chew. If you have to chew the food, then you cannot eat it. You may be instructed to thicken liquids. Follow your health care provider's instructions about how to do this and to what consistency. This information is not intended to replace advice given to you by your health care provider. Make sure you discuss any questions you have with your health care provider. Document Revised: 07/22/2021 Document Reviewed: 07/22/2021 Elsevier Patient  Education  2024 [employer].

## 2023-05-24 NOTE — Progress Notes (Signed)
   05/24/23 1248  Vitals  Temp 99 F (37.2 C)  Pulse Rate 86  Resp (!) 21  BP (!) 149/58  SpO2 99 %  O2 Device Room Air  Weight 87.9 kg  Type of Weight Post-Dialysis  Post Treatment  Dialyzer Clearance Lightly streaked  Hemodialysis Intake (mL) 0 mL  Liters Processed 63.1  Fluid Removed (mL) 0 mL  Tolerated HD Treatment Yes  AVG/AVF Arterial Site Held (minutes) 10 minutes  AVG/AVF Venous Site Held (minutes) 10 minutes   Received patient in bed to unit.  Alert and oriented. x2 Informed consent signed and in chart.   TX duration:  Patient tolerated well.  Transported back to the room  Alert, without acute distress.  Hand-off given to patient's nurse.   Access used: RUAF Access issues: no complications  Total UF removed: even Medication(s) given: none   [facility]

## 2023-05-24 NOTE — Discharge Summary (Addendum)
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 Cardioembolic source   Secondary Diagnoses: Hypertension Hyperlipidemia Diabetes Type II Hx of stroke ESRD on HD Dysphagia  CAD Dementia   Allergies as of 05/24/2023       Reactions   Metformin Hcl Other (See Comments)   Unknown reaction         Medication List     STOP taking these medications    amLODipine 10 MG tablet Commonly known as: NORVASC   aspirin EC 81 MG tablet Replaced by: aspirin 81 MG chewable tablet       TAKE these medications    artificial tears Oint ophthalmic ointment Commonly known as: LACRILUBE Place into both eyes at bedtime as needed for up to 62 doses for dry eyes.   aspirin 81 MG chewable tablet Chew 1 tablet (81 mg total) by mouth daily. Replaces: aspirin EC 81 MG tablet   atorvastatin 80 MG tablet Commonly known as: LIPITOR TAKE 1 TABLET BY MOUTH ONCE DAILY 6 IN THE EVENING   BD Pen Needle Nano 2nd Gen 32G X 4 MM Misc Generic drug: Insulin Pen Needle Inject into the skin 3 (three) times daily.   buPROPion 150 MG 12 hr tablet Commonly known as: WELLBUTRIN SR Take 1 tablet by mouth twice daily   carvedilol 12.5 MG tablet Commonly known as: COREG Take 1 tablet (12.5 mg total) by mouth daily. On non-dialysis days   DULoxetine 60 MG capsule Commonly known as: CYMBALTA Take 1 capsule by mouth once daily   ferrous sulfate 325 (65 FE) MG tablet Take 325 mg by mouth as needed (given when needed at dialysis).   folic acid 1 MG  tablet Commonly known as: FOLVITE Take 1 tablet by mouth once daily   furosemide 80 MG tablet Commonly known as: LASIX Take 80 mg by mouth daily. On non-dialysis days   insulin lispro protamine-lispro (75-25) 100 UNIT/ML Susp injection Commonly known as: HUMALOG 75/25 MIX Inject 10-25 Units into the skin See admin instructions. Inject 25 units with breakfast, 10 units at lunch, 25 units at dinner   levothyroxine 175 MCG tablet Commonly known as: SYNTHROID 1 tablet in the morning on an empty stomach Orally Once a day   memantine 10 MG tablet Commonly known as: Namenda Take 0.5 tablets (5 mg total) by mouth 2 (two) times daily. What changed: how much to take   midodrine 10 MG tablet Commonly known as: PROAMATINE Take 10 mg by mouth daily. 30 min-1 hr prior to HD on hemodialysis days   nitroGLYCERIN 0.4 MG SL tablet Commonly known as: NITROSTAT Place 0.4 mg under the tongue every 5 (five) minutes as needed for chest pain.   polyvinyl alcohol 1.4 % ophthalmic solution Commonly known as: LIQUIFILM TEARS Place 1 drop into both eyes every 4 (four) hours.        LABORATORY STUDIES CBC    Component Value Date/Time   WBC 8.9 05/24/2023 0548   RBC 3.15 (L) 05/24/2023 0548   HGB 8.8 (L)  05/24/2023 0548   HGB 11.6 05/20/2020 1453   HCT 27.8 (L) 05/24/2023 0548   HCT 35.8 05/20/2020 1453   PLT 198 05/24/2023 0548   PLT 276 05/20/2020 1453   MCV 88.3 05/24/2023 0548   MCV 84 05/20/2020 1453   MCH 27.9 05/24/2023 0548   MCHC 31.7 05/24/2023 0548   RDW 15.1 05/24/2023 0548   RDW 13.5 05/20/2020 1453   LYMPHSABS 1.1 05/20/2023 1222   LYMPHSABS 1.7 05/20/2020 1453   MONOABS 0.4 05/20/2023 1222   EOSABS 0.1 05/20/2023 1222   EOSABS 0.1 05/20/2020 1453   BASOSABS 0.1 05/20/2023 1222   BASOSABS 0.1 05/20/2020 1453   CMP    Component Value Date/Time   NA 137 05/24/2023 0548   K 4.1 05/24/2023 0548   CL 101 05/24/2023 0548   CO2 28 05/24/2023 0548   GLUCOSE 115 (H)  05/24/2023 0548   BUN 54 (H) 05/24/2023 0548   CREATININE 5.92 (H) 05/24/2023 0548   CALCIUM 8.1 (L) 05/24/2023 0548   PROT 6.1 (L) 05/20/2023 1222   ALBUMIN 2.9 (L) 05/20/2023 1222   AST 21 05/20/2023 1222   ALT 19 05/20/2023 1222   ALKPHOS 53 05/20/2023 1222   BILITOT 0.6 05/20/2023 1222   GFRNONAA 7 (L) 05/24/2023 0548   GFRAA 43 (L) 02/05/2020 2019   COAGS Lab Results  Component Value Date   INR 1.1 05/20/2023   INR 0.99 11/14/2017   INR 1.68 (H) 11/04/2015   Lipid Panel    Component Value Date/Time   CHOL 163 05/22/2023 0419   TRIG 119 05/22/2023 0419   HDL 37 (L) 05/22/2023 0419   CHOLHDL 4.4 05/22/2023 0419   VLDL 24 05/22/2023 0419   LDLCALC 102 (H) 05/22/2023 0419   HgbA1C  Lab Results  Component Value Date   HGBA1C 6.2 (H) 05/20/2023   Urine Drug Screen negative Alcohol Level    Component Value Date/Time   ETH <10 05/20/2023 1222     SIGNIFICANT DIAGNOSTIC STUDIES DG Swallowing Func-Speech Pathology  Result Date: 05/22/2023 Table formatting from the original result was not included. Modified Barium Swallow Study Patient Details Name: Jennifer [name] MRN: [id number] Date of Birth: 1956-02-19 Today's Date: 05/22/2023 HPI/PMH: HPI: Jennifer [name] is a 67 y.o. female  has a past medical history of CAD (coronary artery disease), Hypercholesteremia, Hypertension, Hypothyroidism, Kidney disease, Renal disorder, Stroke (HCC) (10/2018), Thyroid disease, and Type 2 diabetes mellitus (HCC) (10/08/2018). who presents with acute onset complete expressive aphasia and worsening left-sided weakness s/p TNK at [facility], transferred to [facility] for further care.  Most recent CT head with acute hemorrhage dorsal pons/brachium pontis. Clinical Impression: Clinical Impression: Pt has an oral more than pharyngeal dysphagia. She does not have full labial seal on her R side and does best getting liquids via straw, both in terms of getting more liquid into her mouth as well as keeping it in  there. She has relatively good lingual hold and posterior transit, although movement becomes more repetitive and disorganized as boluses become more solid. She does not fully masticate a small piece of cracker, with most of it coming back out of her oral cavity. Oral residue is pretty mild across consistencies but she cannot clear a barium tablet, which she then expectorated. Her pharyngeal strength and clearance are intact, but she has intermittent delays in initiating her swallow wtih solid consistencies more than liquids. No aspiration occurs. Recommend starting Dys 1 (puree) diet and thin liquids. Factors that may increase risk of adverse  event in presence of aspiration Rubye Oaks & Clearance Coots 2021): Factors that may increase risk of adverse event in presence of aspiration Rubye Oaks & Clearance Coots 2021): Limited mobility Recommendations/Plan: Swallowing Evaluation Recommendations Swallowing Evaluation Recommendations Recommendations: PO diet PO Diet Recommendation: Dysphagia 1 (Pureed); Thin liquids (Level 0) Liquid Administration via: Straw Medication Administration: Crushed with puree Supervision: Patient able to self-feed; Full supervision/cueing for swallowing strategies Swallowing strategies  : Minimize environmental distractions; Slow rate; Small bites/sips; Check for pocketing or oral holding; Check for anterior loss Postural changes: Position pt fully upright for meals Oral care recommendations: Oral care BID (2x/day) Caregiver Recommendations: Have oral suction available Treatment Plan Treatment Plan Treatment recommendations: Therapy as outlined in treatment plan below Follow-up recommendations: Acute inpatient rehab (3 hours/day) Functional status assessment: Patient has had a recent decline in their functional status and demonstrates the ability to make significant improvements in function in a reasonable and predictable amount of time. Treatment frequency: Min 2x/week Treatment duration: 2 weeks Interventions:  Aspiration precaution training; Compensatory techniques; Patient/family education; Trials of upgraded texture/liquids; Diet toleration management by SLP Recommendations Recommendations for follow up therapy are one component of a multi-disciplinary discharge planning process, led by the attending physician.  Recommendations may be updated based on patient status, additional functional criteria and insurance authorization. Assessment: Orofacial Exam: Orofacial Exam Oral Cavity - Dentition: Missing dentition Anatomy: Anatomy: WFL Boluses Administered: Boluses Administered Boluses Administered: Thin liquids (Level 0); Mildly thick liquids (Level 2, nectar thick); Moderately thick liquids (Level 3, honey thick); Puree; Solid  Oral Impairment Domain: Oral Impairment Domain Lip Closure: Escape beyond mid-chin Tongue control during bolus hold: Posterior escape of less than half of bolus Bolus preparation/mastication: Disorganized chewing/mashing with solid pieces of bolus unchewed Bolus transport/lingual motion: Repetitive/disorganized tongue motion Oral residue: Trace residue lining oral structures Location of oral residue : Tongue; Lateral sulci Initiation of pharyngeal swallow : Pyriform sinuses  Pharyngeal Impairment Domain: Pharyngeal Impairment Domain Soft palate elevation: No bolus between soft palate (SP)/pharyngeal wall (PW) Laryngeal elevation: Complete superior movement of thyroid cartilage with complete approximation of arytenoids to epiglottic petiole Anterior hyoid excursion: Complete anterior movement Epiglottic movement: Complete inversion Laryngeal vestibule closure: Complete, no air/contrast in laryngeal vestibule Pharyngeal stripping wave : Present - complete Pharyngeal contraction (A/P view only): N/A Pharyngoesophageal segment opening: Complete distension and complete duration, no obstruction of flow Tongue base retraction: No contrast between tongue base and posterior pharyngeal wall (PPW) Pharyngeal  residue: Complete pharyngeal clearance Location of pharyngeal residue: N/A  Esophageal Impairment Domain: Esophageal Impairment Domain Esophageal clearance upright position: Complete clearance, esophageal coating Pill: Pill Consistency administered: Thin liquids (Level 0); Puree Thin liquids (Level 0): Impaired (see clinical impressions) Puree: Impaired (see clinical impressions) Penetration/Aspiration Scale Score: Penetration/Aspiration Scale Score 1.  Material does not enter airway: Mildly thick liquids (Level 2, nectar thick); Moderately thick liquids (Level 3, honey thick); Puree; Solid 2.  Material enters airway, remains ABOVE vocal cords then ejected out: Thin liquids (Level 0) Compensatory Strategies: No data recorded  General Information: Caregiver present: No  Diet Prior to this Study: NPO   Temperature : Normal   Respiratory Status: WFL   Supplemental O2: None (Room air)   History of Recent Intubation: No  Behavior/Cognition: Alert; Cooperative; Pleasant mood No data recorded Baseline vocal quality/speech: Hypophonia/low volume No data recorded Volitional Swallow: Able to elicit Exam Limitations: No limitations Goal Planning: Prognosis for improved oropharyngeal function: Good No data recorded No data recorded Patient/Family Stated Goal: None stated Consulted and agree with results and  recommendations: Patient Pain: Pain Assessment Pain Assessment: Faces Faces Pain Scale: 0 Facial Expression: 0 Body Movements: 0 Muscle Tension: 0 Compliance with ventilator (intubated pts.): N/A Vocalization (extubated pts.): 0 CPOT Total: 0 Pain Location: head Pain Descriptors / Indicators: Aching Pain Intervention(s): Patient requesting pain meds-RN notified End of Session: Start Time:SLP Start Time (ACUTE ONLY): 1512 Stop Time: SLP Stop Time (ACUTE ONLY): 1529 Time Calculation:SLP Time Calculation (min) (ACUTE ONLY): 17 min Charges: SLP Evaluations $ SLP Speech Visit: 1 Visit SLP Evaluations $BSS Swallow: 1 Procedure  $MBS Swallow: 1 Procedure SLP visit diagnosis: SLP Visit Diagnosis: Dysphagia, unspecified (R13.10) Past Medical History: Past Medical History: Diagnosis Date  CAD (coronary artery disease)   cath 5/23 100% dist RCA lesion treated with 2 overlapping Integrity Resolute DES ( 2.25x31mm, 2.25x68mm), 60% mid RCA treated medically, 99% OM2 not amenable to PCI, 70% D1 lesion, EF normal  Hypercholesteremia   Hypertension   Hypothyroidism   Kidney disease   Renal disorder   Stroke (HCC) 10/2018  Thyroid disease   Type 2 diabetes mellitus (HCC) 10/08/2018 Past Surgical History: Past Surgical History: Procedure Laterality Date  ABDOMINAL HYSTERECTOMY    AV FISTULA PLACEMENT Right 06/28/2022  Procedure: RIGHT ARM ARTERIOVENOUS (AV) FISTULA CREATION;  Surgeon: Larina Earthly, MD;  Location: [facility];  Service: Vascular;  Laterality: Right;  CARDIAC CATHETERIZATION N/A 11/03/2015  Procedure: Left Heart Cath and Coronary Angiography;  Surgeon: Marykay Lex, MD;  Location: [facility] INVASIVE CV LAB;  Service: Cardiovascular;  Laterality: N/A;  CARDIAC CATHETERIZATION N/A 11/03/2015  Procedure: Coronary Stent Intervention;  Surgeon: Marykay Lex, MD;  Location: [facility] INVASIVE CV LAB;  Service: Cardiovascular;  Laterality: N/A;  CARPAL TUNNEL RELEASE    CORONARY ANGIOPLASTY    1 stent  FISTULA SUPERFICIALIZATION Right 08/16/2022  Procedure: RIGHT ARTERIOVENOUS FISTULA SUPERFICIALIZATION;  Surgeon: Larina Earthly, MD;  Location: [facility];  Service: Vascular;  Laterality: Right;  TOTAL KNEE ARTHROPLASTY Right 04/02/2019  Procedure: TOTAL KNEE ARTHROPLASTY;  Surgeon: Vickki Hearing, MD;  Location: [facility];  Service: Orthopedics;  Laterality: Right; Mahala Menghini., M.A. CCC-SLP Acute Rehabilitation Services Office [phone] Secure chat preferred 05/22/2023, 4:07 PM  VAS [place] LOWER EXTREMITY VENOUS (DVT)  Result Date: 05/22/2023  Lower Venous DVT Study Patient Name:  JULIEA EPPINGER  Date of Exam:   05/21/2023 Medical Rec #: [id number]      Accession #:     [id number] Date of Birth: [birth year]     Patient Gender: F Patient Age:   44 years Exam Location:  [facility] Procedure:      VAS [place] LOWER EXTREMITY VENOUS (DVT) Referring Phys: Scheryl Marten Montavis Schubring --------------------------------------------------------------------------------  Indications: Pain.  Risk Factors: None identified. Limitations: Poor ultrasound/tissue interface. Comparison Study: No prior studies. Performing Technologist: Chanda Busing RVT  Examination Guidelines: A complete evaluation includes B-mode imaging, spectral Doppler, color Doppler, and power Doppler as needed of all accessible portions of each vessel. Bilateral testing is considered an integral part of a complete examination. Limited examinations for reoccurring indications may be performed as noted. The reflux portion of the exam is performed with the patient in reverse Trendelenburg.  +---------+---------------+---------+-----------+----------+--------------+ RIGHT    CompressibilityPhasicitySpontaneityPropertiesThrombus Aging +---------+---------------+---------+-----------+----------+--------------+ CFV      Full           Yes      Yes                                 +---------+---------------+---------+-----------+----------+--------------+  SFJ      Full                                                        +---------+---------------+---------+-----------+----------+--------------+ FV Prox  Full                                                        +---------+---------------+---------+-----------+----------+--------------+ FV Mid   Full                                                        +---------+---------------+---------+-----------+----------+--------------+ FV DistalFull                                                        +---------+---------------+---------+-----------+----------+--------------+ PFV      Full                                                         +---------+---------------+---------+-----------+----------+--------------+ POP      Full           Yes      Yes                                 +---------+---------------+---------+-----------+----------+--------------+ PTV      Full                                                        +---------+---------------+---------+-----------+----------+--------------+ PERO     Full                                                        +---------+---------------+---------+-----------+----------+--------------+   +---------+---------------+---------+-----------+----------+-------------------+ LEFT     CompressibilityPhasicitySpontaneityPropertiesThrombus Aging      +---------+---------------+---------+-----------+----------+-------------------+ CFV      Full           Yes      Yes                                      +---------+---------------+---------+-----------+----------+-------------------+ SFJ      Full                                                             +---------+---------------+---------+-----------+----------+-------------------+  FV Prox  Full                                                             +---------+---------------+---------+-----------+----------+-------------------+ FV Mid   Full                                                             +---------+---------------+---------+-----------+----------+-------------------+ FV DistalFull                                                             +---------+---------------+---------+-----------+----------+-------------------+ PFV      Full                                                             +---------+---------------+---------+-----------+----------+-------------------+ POP      Full           Yes      Yes                                      +---------+---------------+---------+-----------+----------+-------------------+ PTV      Full                                                              +---------+---------------+---------+-----------+----------+-------------------+ PERO                                                  Not well visualized +---------+---------------+---------+-----------+----------+-------------------+     Summary: RIGHT: - There is no evidence of deep vein thrombosis in the lower extremity.  - No cystic structure found in the popliteal fossa.  LEFT: - There is no evidence of deep vein thrombosis in the lower extremity. However, portions of this examination were limited- see technologist comments above.  - No cystic structure found in the popliteal fossa.  *See table(s) above for measurements and observations. Electronically signed by Coral Else MD on 05/22/2023 at 12:15:32 PM.    Final    ECHOCARDIOGRAM COMPLETE  Result Date: 05/21/2023    ECHOCARDIOGRAM REPORT   Patient Name:   CABRIA ELEFANTE Date of Exam: 05/21/2023 Medical Rec #:  [id number]     Height:       66.0 in Accession #:    [id number]    Weight:       200.6 lb Date of  Birth:  April 22, 1956    BSA:          2.002 m Patient Age:    67 years      BP:           154/60 mmHg Patient Gender: F             HR:           78 bpm. Exam Location:  Inpatient Procedure: 2D Echo, Color Doppler and Cardiac Doppler Indications:    Stroke  History:        Patient has prior history of Echocardiogram examinations, most                 recent 05/24/2022. CAD, Stroke; Risk Factors:Diabetes and                 Hypertension. Hypothyroidism, CKD.  Sonographer:    Milda Smart Referring Phys: [id number] DEVON SHAFER IMPRESSIONS  1. Left ventricular ejection fraction, by estimation, is 60 to 65%. The left ventricle has normal function. The left ventricle has no regional wall motion abnormalities. There is mild concentric left ventricular hypertrophy. Left ventricular diastolic parameters are consistent with Grade I diastolic dysfunction (impaired relaxation).  2. Right ventricular systolic  function is normal. The right ventricular size is normal.  3. Left atrial size was mildly dilated.  4. Septum is hypermobile.  5. The mitral valve is grossly normal. No evidence of mitral valve regurgitation.  6. The aortic valve is normal in structure. Aortic valve regurgitation is not visualized.  7. The inferior vena cava is normal in size with greater than 50% respiratory variability, suggesting right atrial pressure of 3 mmHg. Conclusion(s)/Recommendation(s): Septum is hypermobile; consider repeat limited TTE with bubble study. FINDINGS  Left Ventricle: Left ventricular ejection fraction, by estimation, is 60 to 65%. The left ventricle has normal function. The left ventricle has no regional wall motion abnormalities. The left ventricular internal cavity size was normal in size. There is  mild concentric left ventricular hypertrophy. Left ventricular diastolic parameters are consistent with Grade I diastolic dysfunction (impaired relaxation). Right Ventricle: The right ventricular size is normal. Right ventricular systolic function is normal. Left Atrium: Left atrial size was mildly dilated. Right Atrium: Right atrial size was normal in size. Pericardium: There is no evidence of pericardial effusion. Mitral Valve: The mitral valve is grossly normal. No evidence of mitral valve regurgitation. MV peak gradient, 7.7 mmHg. The mean mitral valve gradient is 4.0 mmHg. Tricuspid Valve: Tricuspid valve regurgitation is mild. Aortic Valve: The aortic valve is normal in structure. Aortic valve regurgitation is not visualized. Pulmonic Valve: Pulmonic valve regurgitation is not visualized. Aorta: The aortic root and ascending aorta are structurally normal, with no evidence of dilitation. Venous: The inferior vena cava is normal in size with greater than 50% respiratory variability, suggesting right atrial pressure of 3 mmHg. IAS/Shunts: No atrial level shunt detected by color flow Doppler.  LEFT VENTRICLE PLAX 2D LVIDd:          5.70 cm   Diastology LVIDs:         3.20 cm   LV e' medial:    6.31 cm/s LV PW:         1.10 cm   LV E/e' medial:  12.7 LV IVS:        1.10 cm   LV e' lateral:   6.09 cm/s LVOT diam:     2.00 cm   LV E/e' lateral: 13.2 LV SV:  91 LV SV Index:   45 LVOT Area:     3.14 cm  RIGHT VENTRICLE             IVC RV Basal diam:  4.10 cm     IVC diam: 1.90 cm RV Mid diam:    3.00 cm RV S prime:     12.90 cm/s TAPSE (M-mode): 2.5 cm LEFT ATRIUM             Index        RIGHT ATRIUM           Index LA diam:        3.60 cm 1.80 cm/m   RA Area:     15.80 cm LA Vol (A2C):   66.1 ml 33.01 ml/m  RA Volume:   39.50 ml  19.73 ml/m LA Vol (A4C):   60.4 ml 30.16 ml/m LA Biplane Vol: 66.0 ml 32.96 ml/m  AORTIC VALVE LVOT Vmax:   140.00 cm/s LVOT Vmean:  92.800 cm/s LVOT VTI:    0.289 m  AORTA Ao Root diam: 3.20 cm Ao Asc diam:  2.90 cm MITRAL VALVE                TRICUSPID VALVE MV Area (PHT): 1.67 cm     TR Peak grad:   25.8 mmHg MV Area VTI:   2.99 cm     TR Vmax:        254.00 cm/s MV Peak grad:  7.7 mmHg MV Mean grad:  4.0 mmHg     SHUNTS MV Vmax:       1.39 m/s     Systemic VTI:  0.29 m MV Vmean:      90.9 cm/s    Systemic Diam: 2.00 cm MV Decel Time: 453 msec MV E velocity: 80.30 cm/s MV A velocity: 122.00 cm/s MV E/A ratio:  0.66 Ladaysha Land signed by Carolan Clines Signature Date/Time: 05/21/2023/4:04:38 PM    Final    MR BRAIN WO CONTRAST  Result Date: 05/21/2023 CLINICAL DATA:  67 year old female code stroke presentation on 05/20/2023 with small right brainstem hemorrhage. EXAM: MRI HEAD WITHOUT CONTRAST TECHNIQUE: Multiplanar, multiecho pulse sequences of the brain and surrounding structures were obtained without intravenous contrast. COMPARISON:  Head CTs 05/20/2023 and earlier. Brain MRI 12/01/2022 and earlier. FINDINGS: Brain: Chronic very severe small-vessel disease signal changes in the deep white matter, deep gray matter, brainstem, cerebellum. Scattered chronic microhemorrhages and  chronic transependymal hemosiderin bilaterally. Extensive brainstem chronic hemorrhages on SWI. Deep cerebellar chronic microhemorrhages also. The above findings are chronic but mildly progressed since the June MRI. Superimposed small acute dorsal right brainstem hemorrhage is most apparent as DWI artifact (series 2, image 16), hypointense on T1 and 4-5 mm (series 8, image 14). No significant posterior fossa mass effect. Superimposed punctate posterosuperior left frontal lobe restricted diffusion in the subcortical white matter of the perirolandic area series 2, image 38. No other convincing restricted diffusion. No superimposed midline shift, evidence of mass lesion, ventriculomegaly. Cervicomedullary junction and pituitary are within normal limits. Vascular: Major intracranial vascular flow voids are preserved. Skull and upper cervical spine: Stable, negative. Sinuses/Orbits: Stable, negative. Other: Mastoids are well aerated. Grossly normal visible internal auditory structures. Negative visible scalp and face. IMPRESSION: 1. Punctate posterior Left MCA territory subcortical white matter infarct. No mass or hemorrhage there. 2. Subcentimeter acute right dorsal brainstem hemorrhage is stable since yesterday. No mass effect. 3. Underlying very severe underlying chronic small vessel disease including extensive chronic microhemorrhages  and small vessel infarcts throughout the brain - mildly progressed from a June MRI. Electronically Signed   By: Odessa Fleming M.D.   On: 05/21/2023 09:36   CT HEAD WO CONTRAST ( )  Result Date: 05/20/2023 CLINICAL DATA:  Hemorrhage follow-up EXAM: CT HEAD WITHOUT CONTRAST TECHNIQUE: Contiguous axial images were obtained from the base of the skull through the vertex without intravenous contrast. RADIATION DOSE REDUCTION: This exam was performed according to the departmental dose-optimization program which includes automated exposure control, adjustment of the mA and/or kV according to  patient size and/or use of iterative reconstruction technique. COMPARISON:  05/20/2023 at 3:48 p.m. FINDINGS: Brain: Unchanged appearance of small intraparenchymal hematoma at the right middle cerebellar peduncle. Chronic white matter hypoattenuation. Old left cerebellar infarct. No extra-axial collection. No mass effect or hydrocephalus. Vascular: There is atherosclerotic calcification of both internal carotid arteries at the skull base. Skull: Normal Sinuses/Orbits: Negative Other: None IMPRESSION: Unchanged appearance of small intraparenchymal hematoma at the right middle cerebellar peduncle. Electronically Signed   By: Deatra Robinson M.D.   On: 05/20/2023 21:07   DG CHEST PORT 1 VIEW  Result Date: 05/20/2023 CLINICAL DATA:  Hypoxia, nausea and vomiting EXAM: PORTABLE CHEST 1 VIEW COMPARISON:  01/23/2022 FINDINGS: Single frontal view of the chest demonstrates an unremarkable cardiac silhouette. No airspace disease, effusion, or pneumothorax. No acute bony abnormalities. IMPRESSION: 1. No acute intrathoracic process. Electronically Signed   By: Sharlet Salina M.D.   On: 05/20/2023 17:25   CT HEAD WO CONTRAST ( )  Result Date: 05/20/2023 CLINICAL DATA:  Neuro deficit, acute, stroke suspected EXAM: CT HEAD WITHOUT CONTRAST TECHNIQUE: Contiguous axial images were obtained from the base of the skull through the vertex without intravenous contrast. RADIATION DOSE REDUCTION: This exam was performed according to the departmental dose-optimization program which includes automated exposure control, adjustment of the mA and/or kV according to patient size and/or use of iterative reconstruction technique. COMPARISON:  Same day CT head FINDINGS: Brain: There is a 7 x 8 mm site acute hemorrhage in the right dorsal pons/brachium pontis (series 2, image 8). Sequela of mild chronic microvascular ischemic change. Chronic infarcts in the right basal ganglia and left cerebellum. No significant mass effect. No mass lesion.  Vascular: No hyperdense vessel or unexpected calcification. Skull: Normal. Negative for fracture or focal lesion. Sinuses/Orbits: No middle ear or mastoid effusion. Paranasal sinuses are clear. Bilateral lens. Orbits are otherwise unremarkable. Other: None. IMPRESSION: Acute hemorrhage in the right dorsal pons/brachium pontis measuring 7 x 8 mm. No significant mass effect. Findings were paged to Dr. Selina Cooley on 05/20/23 at 4:21 PM. Electronically Signed   By: Lorenza Cambridge M.D.   On: 05/20/2023 16:21   CT ANGIO HEAD NECK W WO CM (CODE STROKE)  Result Date: 05/20/2023 CLINICAL DATA:  67 year old female code stroke presentation with left side weakness, facial droop, aphasia. EXAM: CT ANGIOGRAPHY HEAD AND NECK TECHNIQUE: Multidetector CT imaging of the head and neck was performed using the standard protocol during bolus administration of intravenous contrast. Multiplanar CT image reconstructions and MIPs were obtained to evaluate the vascular anatomy. Carotid stenosis measurements (when applicable) are obtained utilizing NASCET criteria, using the distal internal carotid diameter as the denominator. RADIATION DOSE REDUCTION: This exam was performed according to the departmental dose-optimization program which includes automated exposure control, adjustment of the mA and/or kV according to patient size and/or use of iterative reconstruction technique. CONTRAST:  75mL OMNIPAQUE IOHEXOL 350 MG/ML SOLN COMPARISON:  Plain head CT 1223 hours today. Prior CTA  head and neck 10/08/2018. FINDINGS: CTA NECK Skeleton: Largely absent dentition. Mild for age cervical spine degeneration. No acute osseous abnormality identified. Upper chest: Negative. Other neck: Small volume retained secretions in the hypopharynx. Otherwise negative. Aortic arch: 3 vessel arch. Chronic Calcified aortic atherosclerosis. Right carotid system: Brachiocephalic artery plaque without stenosis. Negative right CCA. Mild calcified plaque at the right carotid  bifurcation more affecting the ECA. No stenosis. Left carotid system: Mild left CCA origin calcified plaque without stenosis. Mild calcified plaque at the left carotid bifurcation. Mild tortuosity. No stenosis. Vertebral arteries: Mild right subclavian origin calcified plaque without stenosis. Minimal plaque at the right vertebral artery origin without stenosis. Tortuous right V1 segment. Right vertebral patent to the skull base without stenosis. Proximal left subclavian artery soft and calcified plaque without stenosis. Left vertebral artery origin remains normal. Tortuous left V1 segment. Codominant left vertebral artery is mildly tortuous to the skull base with no plaque or stenosis. CTA HEAD Posterior circulation: Distal vertebral arteries, vertebrobasilar junction and PICA origins appear patent and normal. No basilar artery stenosis. Patent AICA, SCA, PCA origins. Bilateral PCA branches remain patent. Left P2 segment moderate irregularity and stenosis on series 12, image 22 appears increased. Chronic right P2 segment irregularity and stenosis is mild to moderate and stable. Anterior circulation: Both ICA siphons are patent but heavily calcified. On the left moderate cavernous and anterior genu stenosis appears progressed since 2020. On the right moderate to severe cavernous segment (series 6, image 246) and anterior genu stenosis also appears progressed. Patent carotid termini. Normal MCA and ACA origins. Normal anterior communicating artery. Small median artery of the corpus callosum. Bilateral ACA branches are stable and within normal limits. Left MCA M1 segment and bifurcation are patent without stenosis. Right MCA M1 segment and bifurcation are patent without stenosis. Bilateral MCA branches appear stable since 2020. Mild if any branch irregularity. Venous sinuses: Patent. Anatomic variants: None significant. Review of the MIP images confirms the above findings IMPRESSION: 1. Negative for large vessel  occlusion. Preliminary report of this finding communicated to Dr. Bing Neighbors at 1247 hours on 05/20/2023 by text page via the [facility] messaging system. 2. Positive for chronic ICA siphon calcified plaque where stenosis appears progressed from a 2020 CTA, up to Moderate on the Left and Severe on the Right. Moderate Left PCA P2 segment stenosis also appears increased. Mild for age extracranial atherosclerosis without stenosis. 3.  Aortic Atherosclerosis (ICD10-I70.0). Electronically Signed   By: Odessa Fleming M.D.   On: 05/20/2023 12:55   CT HEAD CODE STROKE WO CONTRAST  Result Date: 05/20/2023 CLINICAL DATA:  Code stroke.  Neuro deficit, acute, stroke suspected EXAM: CT HEAD WITHOUT CONTRAST TECHNIQUE: Contiguous axial images were obtained from the base of the skull through the vertex without intravenous contrast. RADIATION DOSE REDUCTION: This exam was performed according to the departmental dose-optimization program which includes automated exposure control, adjustment of the mA and/or kV according to patient size and/or use of iterative reconstruction technique. COMPARISON:  Brain MR 07/02/22 FINDINGS: Brain: No hemorrhage. No hydrocephalus. No extra-axial fluid collection. There is sequela of severe chronic microvascular ischemic change. Chronic infarcts in the right basal ganglia and left cerebellum. Vascular: No hyperdense vessel or unexpected calcification. Skull: Normal. Negative for fracture or focal lesion. Sinuses/Orbits: No middle ear or mastoid effusion. Paranasal sinuses are clear. Bilateral lens replacement. Orbits are otherwise unremarkable. Other: None. ASPECTS ([place] Stroke Program Early CT Score): 10 IMPRESSION: 1. No hemorrhage or CT evidence of an acute cortical infarct.  Aspects is 10. 2. Sequela of severe chronic microvascular ischemic change. Chronic infarcts in the right basal ganglia and left cerebellum. Findings were discussed with Dr. Deretha Emory on 05/20/23 at 12:38 PM Electronically Signed    By: Lorenza Cambridge M.D.   On: 05/20/2023 12:39       HISTORY OF PRESENT ILLNESS 67 y.o. patient with history of CAD, hyperlipidemia, hypertension, hypothyroidism, ESRD on dialysis, stroke and type 2 diabetes was admitted with expressive aphasia and worsening left-sided weakness.  HOSPITAL COURSE Patient originally presented to [facility] and was given TNK there.  She transferred here and had worsening of her exam, head CT was performed demonstrating hemorrhagic transformation of right pontine infarct.  TNK was reversed with TXA and cryoprecipitate.  She did have a hematoma at her right upper arm dialysis fistula, but this has greatly improved.  Hemorrhagic transformation was noted to be stable on later CT and MRI.  Patient is now ready to return home with family support and home health physical therapy, Occupational Therapy and speech therapy.   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 Cardioembolic source Code Stroke CT head No acute abnormality.  ASPECTS 10.    CTA head & neck: No LVO, right ICA siphon severe stenosis, left ICA siphon moderate and left P2 moderate stenosis. Repeat CTH on admission to [facility] ICU with neuro change: Acute hemorrhage in the right dorsal pons/brachium pontis measuring 7 x 8 mm. No significant mass effect Repeat CTH stability scan: Unchanged appearance of small intraparenchymal hematoma right middle cerebellar peduncle MRI  Punctate posterior left MCA territory subcortical white matter infarct. Acute right dorsal brainstem hemorrhage stable. Underlying very severe chronic small vessel disease including extensive chronic microhemorrhages and small vessel infarcts throughout brain 2D Echo: LVEF 60 to 65%  VAS [place] LE DVT: Negative Outpatient 30-day cardiac monitoring ordered LDL 102 HgbA1c 6.2 VTE prophylaxis - heparin subq aspirin 81 mg daily prior to admission, now on ASA 81 on  discharge.  Therapy recommendations:  CIR, however, family prefer HH Disposition: Home with home health PT, OT and SLP   Hx of Stroke/TIA June 2019, admitted for left sided tingling, blurry vision.  MRI showed Right Thalamic infarcts and right frontoparietal white matter lacunar infarct, multiple remote lacunar infarcts seen.  MRA, carotid Doppler and 2D echo unremarkable. Aspirin increased to 162mg .  And discharged also on statin April 2020, admitted for nausea vomiting, ataxia and vertigo. MRI shows Left lateral corpus collosum, with diffusion positive lesion likely a large number of lacuanr infarct.  Extensive changes of chronic small vessel disease involving brainstem.  CT head and neck showed bilateral ICA siphon moderate stenosis.  Echo showed normal ejection fraction.  Patient was discharged home with no therapy needs.  A1c was 6.4, LDL was elevated.  She was started on Lipitor 80 mg and on DAPT. June 2024 Outpatient MRI showed chronic lacunar ischemic infarcts within right basal ganglia, bilateral thalami and bilateral cerebellar hemispheres, moderate chronic small vessel disease and mild to moderate atrophy Follows with Dr. Pearlean Brownie outpatient    Hypertension Hypotension with dialysis Home meds: Amlodipine 10 mg, carvedilol 12.5 mg Lasix 80 mg Per daughter, patient's blood pressure meds were recently lowered and she was started on midodrine before dialysis due to hypotension during dialysis treatments MRI showed extensive chronic microhemorrhages, more at brainstem, indicating uncontrolled HTN in the past Blood Pressure Goal: SBP less than 160  Long-term BP goal normotensive  Hyperlipidemia Home meds: Lipitor 80 mg LDL 102, goal < 70 Resumed Lipitor 80 Continue statin on discharge   Diabetes type II Controlled Home meds: Humalog insulin HgbA1c 6.2, goal < 7.0 CBGs SSI Recommend close follow-up with PCP    Dysphagia Patient has post-stroke dysphagia SLP consulted Now on  dysphagia 1 and thin liquid Advance diet as tolerated   Other Stroke Risk Factors Obesity, Body mass index is 29.66 kg/m., BMI >/= 30 associated with increased stroke risk, recommend weight loss, diet and exercise as appropriate  Family hx stroke (sister) Coronary artery disease Former smoker   Other Active Problems Dementia on Namenda, resumed home Namenda ESRD on hemodialysis Receives hemodialysis Tuesday, Wednesday, Saturday, Sunday at home Had HD 12/9  DISCHARGE EXAM  PHYSICAL EXAM General: Chronically ill-appearing elderly patient in no acute distress, redness of right eye noted Psych:  Mood and affect appropriate for situation CV: Regular rate and rhythm on monitor Respiratory:  Regular, unlabored respirations on supplemental O2 GI: Abdomen soft and nontender  NEURO:  Mental Status: Alert and oriented to person and place, able to state correct month but gets the year wrong.  Able to have limited discussion of current situation and desires for further care Speech/Language: speech is without dysarthria or aphasia.   Cranial Nerves:  II: PERRL.  III, IV, VI: EOMI. V: Sensation is intact to light touch and symmetrical to face.  VII: Right facial droop and impaired closing of the right eye VIII: hearing intact to voice. IX, X: Phonation is normal.  [id number] shrug 5/5. XII: tongue is midline without fasciculations. Motor: Able to move all 4 extremities with good antigravity strength, motion of right arm limited due to ongoing dialysis in right arm fistula, left leg noted to be stronger than right. Tone: is normal and bulk is normal Sensation- Intact to light touch bilaterally.  Coordination: FTN with some right-sided past-pointing and minor ataxia Gait- deferred  Last NIHSS 9  Discharge Diet       Diet   DIET - DYS 1 Room service appropriate? No; Fluid consistency: Thin   liquids  DISCHARGE PLAN Disposition: Home with Home Health aspirin 81 mg daily for  secondary stroke prevention indefinitely, will discuss further anticoagulation at neurology follow-up appointment Ongoing stroke risk factor control by Primary Care Physician at time of discharge Follow-up PCP [name], Audrie Gallus, MD in 2 weeks. Follow-up with cardiology after completion of 30-day cardiac monitoring Follow-up in [facility] in 4 weeks, office to schedule an appointment.   35 minutes were spent preparing discharge.  Cortney E Ernestina Columbia , MSN, AGACNP-BC [facility] See Amion for schedule and pager information 05/24/2023 2:33 PM  ATTENDING NOTE: I reviewed above note and agree with the assessment and plan. Pt was seen and examined.   No acute event overnight. 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 PT and OT recommend CIR but family would prefer HH. Will d/c home today, continue artifical tears and ointment. Consider 30 day cardiac event monitoring. Put on ASA 81. Resumed lipitor. Will follow up with Dr. Pearlean Brownie at [facility] in 4 weeks.   For detailed assessment and plan, please refer to above/below as I have made changes wherever appropriate.   Marvel Plan, MD PhD Stroke Neurology 05/24/2023 8:31 PM

## 2023-05-24 NOTE — Procedures (Addendum)
This note is not intended for this patient.

## 2023-05-24 NOTE — Progress Notes (Signed)
Pt came to HD this am---her dysphagia 1 tray came with her as it was not delivered to her room as it was ordered early Pt was offered her applesauce from her tray after her tx was initiated--stated"I dont like that"---explained to her that we would address that issue later as her o2 sats were 87% at the onset of the tx and 2lnc were required to be put on--- Upon reassing her after putting my second pt on---pt was sleeping soundly and her o2 sate was 92%--- Spoke with pts family and her primary care RN just now about this ---all appeared to have an understanding of the decisions to treat the o2 status and level of consciousness rather than feeds her anything further---am blood sugar was WNL

## 2023-05-24 NOTE — Progress Notes (Signed)
  [facility] Progress Note   Subjective:  Pt seen in HD no c/o's.   Objective Vitals:   05/24/23 1200 05/24/23 1230 05/24/23 1248 05/24/23 1320  BP: (!) 143/58 (!) 147/58 (!) 149/58 (!) 161/58  Pulse: 82 87 86 91  Resp: 18 17 (!) 21   Temp:   99 F (37.2 C) 98.2 F (36.8 C)  TempSrc:    Oral  SpO2: 100% 97% 99% 95%  Weight:   87.9 kg   Height:       Physical Exam General: Awake, chronically-ill appearing, NAD, room air Neuro: R-sided facial droop Heart: S1 and S2; No MRGs Lungs: Clear anteriorly Abdomen: Soft and non-tender Extremities: 1+bilat hip/ thigh edema Dialysis Access: R AVF (+) B/T, bruising and swelling improving   Dialysis Orders: [facility] 4x weekly Sat/Sun/Tues/Wed 3hrs 2K BFR 400 EDW 88kg RUA AVF NO heparin for now due to brainstem bleed  Assessment/Plan:  # Acute ischemic infarction s/p TNK with brainstem hemorrhage - Repeat head ct showed an acute hemorrhage in the right brainstem measuring 7X8 cm, no mass effect. TNK reversed. Neurology following and on neuro ICU.   # ESRD -  on HHD outpatient. Doing HD MWF here. HD today. No heparin during treatments for next 2 weeks.  # Hypertension/volume  - euvolemic on exam, 2kg under dry wt. SP IV Cleviprex, dc'd 12/09. Getting PRN hydralazine IV.   # Anemia of CKD - Hgb 11 > 8-9. Follow. No Fe/ESA indicated at this time  # Secondary Hyperparathyroidism - CCa and phos are okay.   # Nutrition - NPO. Changed to renal diet with fluid restrictions when medically stable.  # Dispo - looks like for dc today  to home  Rob Arlean Hopping  MD  [facility] 05/24/2023, 4:05 PM  Recent Labs  Lab 05/20/23 1222 05/22/23 0419 05/23/23 0446 05/24/23 0548  HGB 11.1* 8.5* 8.4* 8.8*  ALBUMIN 2.9*  --   --   --   CALCIUM 8.8* 8.1* 7.9* 8.1*  PHOS  --  6.3*  --   --   CREATININE 4.03* 5.68* 4.31* 5.92*  K 3.5 3.2* 3.7 4.1    Inpatient medications:  aspirin  81 mg Oral Daily   atorvastatin  80 mg Oral q1800    Chlorhexidine Gluconate Cloth  6 each Topical Q0600   feeding supplement  237 mL Oral BID BM   heparin injection (subcutaneous)  5,000 Units Subcutaneous Q8H   insulin aspart  0-6 Units Subcutaneous TID WC   memantine  10 mg Oral BID   mouth rinse  15 mL Mouth Rinse 4 times per day   pantoprazole (PROTONIX) IV  40 mg Intravenous QHS   polyvinyl alcohol  1 drop Both Eyes Q4H   senna-docusate  1 tablet Oral BID   sodium chloride flush  3 mL Intravenous Q12H    acetaminophen **OR** acetaminophen (TYLENOL) oral liquid 160 mg/5 mL **OR** acetaminophen, artificial tears, bisacodyl, hydrALAZINE, ondansetron (ZOFRAN) IV, mouth rinse

## 2023-05-24 NOTE — Progress Notes (Signed)
Has not had bowel movement for several days but refused all interventions. Stated she will have bowel movement when she gets home, as she states it's hard to have bowel movement away from home. Abdomen is soft, bowels sounds are active.

## 2023-05-24 NOTE — Plan of Care (Signed)
Spoke with pt and explained about the importance of wearing the SCDs.  Said she wanted to get some sleep so she took them off.  I did explain the importance of the use of the SCDs and that they will not work if they ae not on.  She agreed and said to put them back on her and she would try to keep them on.   Problem: Education: Goal: Knowledge of disease or condition will improve Outcome: Progressing   Problem: Education: Goal: Knowledge of patient specific risk factors will improve Jennifer [name] N/A or DELETE if not current risk factor) Outcome: Progressing   Problem: Education: Goal: Knowledge of secondary prevention will improve (MUST DOCUMENT ALL) Outcome: Progressing   Problem: Intracerebral Hemorrhage Tissue Perfusion: Goal: Complications of Intracerebral Hemorrhage will be minimized Outcome: Progressing   Problem: Coping: Goal: Will identify appropriate support needs Outcome: Progressing   Problem: Self-Care: Goal: Ability to participate in self-care as condition permits will improve Outcome: Progressing   Problem: Nutrition: Goal: Risk of aspiration will decrease Outcome: Progressing   Problem: Nutrition: Goal: Dietary intake will improve Outcome: Progressing

## 2023-05-24 NOTE — Progress Notes (Signed)
Transported to dialysis in bed, accompanied by transporter.

## 2023-05-24 NOTE — Plan of Care (Signed)
  Problem: Education: Goal: Knowledge of disease or condition will improve Outcome: Progressing Goal: Knowledge of secondary prevention will improve (MUST DOCUMENT ALL) Outcome: Progressing Goal: Knowledge of patient specific risk factors will improve Loraine Leriche N/A or DELETE if not current risk factor) Outcome: Progressing   Problem: Intracerebral Hemorrhage Tissue Perfusion: Goal: Complications of Intracerebral Hemorrhage will be minimized Outcome: Progressing   Problem: Coping: Goal: Will verbalize positive feelings about self Outcome: Progressing Goal: Will identify appropriate support needs Outcome: Progressing   Problem: Health Behavior/Discharge Planning: Goal: Ability to manage health-related needs will improve Outcome: Progressing Goal: Goals will be collaboratively established with patient/family Outcome: Progressing   Problem: Self-Care: Goal: Ability to participate in self-care as condition permits will improve Outcome: Progressing Goal: Verbalization of feelings and concerns over difficulty with self-care will improve Outcome: Progressing

## 2023-05-24 NOTE — H&P (Incomplete)
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  Per chart review patient lives with spouse.  1 level home 5 steps to entry.  She has been using a wheelchair for the past month due to fear of falling and needing assist for stand pivot transfers.  Modified independent with rolling walker for household distance mobility.  She does have a daughter with good support.  Presented 05/20/2023 with acute onset of  and increased left-sided weakness to [facility].  Admission chemistries unremarkable except chloride 96 glucose 234 BUN 43 creatinine 4.03, hemoglobin A1c 6.2.  Cranial CT scan showed no hemorrhage or CT evidence of acute infarct.  Sequela of severe chronic microvascular ischemic change.  CTA head and neck negative for large vessel occlusion.  Positive for chronic ICA siphon calcified plaque or stenosis appears progressed from 2020.Marland Kitchen  Patient did receive TNK.  On initial report it was thought that the TNK was given through the AV's fistula however it was later confirmed by family was given through the IV and the fistula infiltrated.  Patient developed increasing facial droop worsening left-sided weakness also found to have fistula site hematoma and was transferred to [facility] for further evaluation.  Cranial CT scan follow-up 05/20/2023 showed acute hemorrhage in the right dorsal pons/brachium pontis measuring 7 x 8 mm no significant mass effect.  MRI of the brain showed punctate posterior left MCA territory subcortical white matter infarction.  No mass or  hemorrhage.  Subcentimeter acute right dorsal brainstem hemorrhage stable no mass effect.  Echocardiogram with ejection fraction of 60 to 65% no wall motion abnormalities grade 1 diastolic dysfunction.  VASc neurosurgery follow-up Dr. Carolynn Sayers for right arm fistula hematoma and advised conservative care with Ace wrap and dressing later taken down and continue to monitor.  Maintained on subcutaneous heparin for DVT prophylaxis with venous Doppler studies negative.  Neurology continues to follow currently on no antithrombotic due to hemorrhagic transformation from TNK.  Nephrology follow-up hemodialysis as directed.  Currently on dysphagia #1 thin liquid diet.  Bouts of hypotension with dialysis with amlodipine, Coreg and Lasix on hold.  Therapy evaluations completed due to patient decreased functional mobility left-sided weakness aphasia/dysphagia was admitted for a comprehensive rehab program.  Review of Systems  Constitutional:  Negative for chills and fever.  HENT:  Negative for hearing loss.   Eyes:  Negative for blurred vision and double vision.  Respiratory:  Negative for cough, shortness of breath and wheezing.   Cardiovascular:  Positive for leg swelling. Negative for chest pain and palpitations.  Gastrointestinal:  Positive for constipation. Negative for heartburn, nausea and vomiting.  Genitourinary:  Negative for dysuria, flank pain and hematuria.  Musculoskeletal:  Positive for joint pain and myalgias.  Skin:  Negative for rash.  Neurological:  Positive for sensory change, speech change and weakness.  All other systems reviewed and are negative.  Past Medical History:  Diagnosis Date   CAD (coronary artery disease)    cath 5/23 100% dist RCA lesion treated with 2 overlapping Integrity Resolute DES ( 2.25x57mm, 2.25x61mm), 60% mid RCA treated medically, 99% OM2 not amenable to  PCI, 70% D1 lesion, EF normal   Hypercholesteremia    Hypertension    Hypothyroidism    Kidney disease     Renal disorder    Stroke (HCC) 10/2018   Thyroid disease    Type 2 diabetes mellitus (HCC) 10/08/2018   Past Surgical History:  Procedure Laterality Date   ABDOMINAL HYSTERECTOMY     AV FISTULA PLACEMENT Right 06/28/2022   Procedure: RIGHT ARM ARTERIOVENOUS (AV) FISTULA CREATION;  Surgeon: Larina Earthly, MD;  Location: [facility];  Service: Vascular;  Laterality: Right;   CARDIAC CATHETERIZATION N/A 11/03/2015   Procedure: Left Heart Cath and Coronary Angiography;  Surgeon: Marykay Lex, MD;  Location: [facility] INVASIVE CV LAB;  Service: Cardiovascular;  Laterality: N/A;   CARDIAC CATHETERIZATION N/A 11/03/2015   Procedure: Coronary Stent Intervention;  Surgeon: Marykay Lex, MD;  Location: [facility] INVASIVE CV LAB;  Service: Cardiovascular;  Laterality: N/A;   CARPAL TUNNEL RELEASE     CORONARY ANGIOPLASTY     1 stent   FISTULA SUPERFICIALIZATION Right 08/16/2022   Procedure: RIGHT ARTERIOVENOUS FISTULA SUPERFICIALIZATION;  Surgeon: Larina Earthly, MD;  Location: [facility];  Service: Vascular;  Laterality: Right;   TOTAL KNEE ARTHROPLASTY Right 04/02/2019   Procedure: TOTAL KNEE ARTHROPLASTY;  Surgeon: Vickki Hearing, MD;  Location: [facility];  Service: Orthopedics;  Laterality: Right;   Family History  Problem Relation Age of Onset   Heart disease Mother    Stroke Sister    Heart attack Brother    Breast cancer Neg Hx    Social History:  reports that she quit smoking about 2 years ago. Her smoking use included cigarettes. She has never used smokeless tobacco. She reports that she does not drink alcohol and does not use drugs. Allergies:  Allergies  Allergen Reactions   Metformin Hcl Other (See Comments)    Unknown reaction    Medications Prior to Admission  Medication Sig Dispense Refill   aspirin EC 81 MG tablet Take 1 tablet (81 mg total) by mouth 2 (two) times daily. Hold aspirin while you are on apixaban/Eliquis 1 tablet 0   atorvastatin (LIPITOR) 80 MG tablet TAKE 1 TABLET BY MOUTH  ONCE DAILY 6 IN THE EVENING 30 tablet 6   buPROPion (WELLBUTRIN SR) 150 MG 12 hr tablet Take 1 tablet by mouth twice daily 180 tablet 2   carvedilol (COREG) 12.5 MG tablet Take 1 tablet (12.5 mg total) by mouth 2 (two) times daily with a meal. (Patient taking differently: Take 12.5 mg by mouth daily. On non-dialysis days) 60 tablet 6   DULoxetine (CYMBALTA) 60 MG capsule Take 1 capsule by mouth once daily 90 capsule 2   ferrous sulfate 325 (65 FE) MG tablet Take 325 mg by mouth as needed (given when needed at dialysis).     folic acid (FOLVITE) 1 MG tablet Take 1 tablet by mouth once daily 30 tablet 0   furosemide (LASIX) 80 MG tablet Take 80 mg by mouth daily. On non-dialysis days     insulin lispro protamine-lispro (HUMALOG 75/25 MIX) (75-25) 100 UNIT/ML SUSP injection Inject 10-25 Units into the skin See admin instructions. Inject 25 units with breakfast, 10 units at lunch, 25 units at dinner     levothyroxine (SYNTHROID) 175 MCG tablet 1 tablet in the morning on an empty stomach Orally Once a day     memantine (NAMENDA) 10 MG tablet Take 1 tablet (10 mg total) by mouth 2 (two) times daily. 180 tablet 1  midodrine (PROAMATINE) 10 MG tablet Take 10 mg by mouth daily. 30 min-1 hr prior to HD on hemodialysis days     nitroGLYCERIN (NITROSTAT) 0.4 MG SL tablet Place 0.4 mg under the tongue every 5 (five) minutes as needed for chest pain.     amLODipine (NORVASC) 10 MG tablet Take 1 tablet by mouth once daily (Patient not taking: Reported on 04/19/2023) 90 tablet 3   BD PEN NEEDLE NANO 2ND GEN 32G X 4 MM MISC Inject into the skin 3 (three) times daily.        Home: Home Living Family/patient expects to be discharged to:: Private residence Living Arrangements: Spouse/significant other, Children (daughter) Available Help at Discharge: Family, Available 24 hours/day Type of Home: House Home Access: Stairs to enter, Ramped entrance Secretary/administrator of Steps: 5-6 Entrance Stairs-Rails: Can  reach both Home Layout: One level Bathroom Shower/Tub: Engineer, manufacturing systems: Standard Bathroom Accessibility: Yes Home Equipment: Agricultural consultant (2 wheels), [employer] - single point, BSC/3in1, [employer], Hospital bed, Wheelchair - manual, Grab bars - tub/shower Additional Comments: recent completion with dtr, [name], who does her home hemodialysis 4 days per week,t,w,sat and Sun  Lives With: Spouse   Functional History: Prior Function Prior Level of Function : Needs assist Mobility Comments: been using WC for past month due to fear of faling and been needing assist for stand pivot transfers; mod I with RW for household distance mobility prior to that; hx of frequent falls ADLs Comments: Pt reports assist for all ADL tasks from husband. Able to help with UB ADL some.  Functional Status:  Mobility: Bed Mobility Overal bed mobility: Needs Assistance Bed Mobility: Supine to Sit Supine to sit: Mod assist, HOB elevated Sit to supine: Mod assist, +2 for safety/equipment General bed mobility comments: See PT note for bed mobility Transfers Overall transfer level: Needs assistance Equipment used: 2 person hand held assist Transfers: Sit to/from Stand, Bed to chair/wheelchair/BSC Sit to Stand: Mod assist, From elevated surface Bed to/from chair/wheelchair/BSC transfer type:: Step pivot Step pivot transfers: +2 physical assistance, +2 safety/equipment, Mod assist General transfer comment: Pt completed sit to stand with 1 person assisting from bed. VC for hand placement prior to sit to stand. 2 person hand held assist provided for step pivot with VC for sequencing and direction. posterior lean noted. Ambulation/Gait Ambulation/Gait assistance: Max assist Educational psychologist (Feet): 2 Feet Assistive device: 1 person hand held assist Gait Pattern/deviations: Step-to pattern, Decreased step length - right, Decreased step length - left, Decreased stride length, Decreased weight shift to right,  Decreased weight shift to left, Trunk flexed General Gait Details: mod/max assist pivot steps only Gait velocity: reduced Gait velocity interpretation: <1.31 ft/sec, indicative of household ambulator    ADL: ADL Overall ADL's : Needs assistance/impaired Grooming: Set up, Wash/dry face, Sitting Upper Body Bathing: Minimal assistance, Cueing for sequencing, Sitting Lower Body Bathing: Total assistance, Cueing for sequencing, Sit to/from stand Upper Body Dressing : Maximal assistance, Sitting Lower Body Dressing: Total assistance, Sit to/from stand Toilet Transfer: Minimal assistance, +2 for physical assistance, Cueing for sequencing, Cueing for safety, Stand-pivot Toilet Transfer Details (indicate cue type and reason): simulated Toileting- Clothing Manipulation and Hygiene: Total assistance, +2 for physical assistance, Sit to/from stand  Cognition: Cognition Overall Cognitive Status: Impaired/Different from baseline Arousal/Alertness: Awake/alert Orientation Level: Disoriented to time Attention: Sustained Sustained Attention: Impaired Sustained Attention Impairment: Functional basic, Verbal basic Memory: Impaired Memory Impairment: Retrieval deficit Awareness: Impaired Awareness Impairment: Intellectual impairment Safety/Judgment: Impaired Cognition Arousal: Alert Behavior  During Therapy: Flat affect Overall Cognitive Status: Impaired/Different from baseline Area of Impairment: Attention, Following commands, Safety/judgement, Awareness, Problem solving Current Attention Level: Selective Following Commands: Follows one step commands consistently Safety/Judgement: Decreased awareness of safety, Decreased awareness of deficits Awareness: Emergent Problem Solving: Slow processing, Difficulty sequencing, Requires verbal cues, Requires tactile cues, Decreased initiation General Comments: Pt unaware of her L lateral lean, needing repeated cues to correct and maintain midline. Needed  multi-modal cues to sequence taking steps along EOB. Pt unaware of her dizziness, even denying it while attempting to have bouts of emesis, so decreased insight into her deficits  Physical Exam: Blood pressure (!) 148/51, pulse 95, temperature 99 F (37.2 C), temperature source Oral, resp. rate 16, height 5\' 7"  (1.702 m), weight 87.9 kg, SpO2 93%. Physical Exam Neurological:     Comments: Patient is alert.  No acute distress and makes eye contact with examiner.  She does provide name and age with some delay in processing.  Follows simple commands.  She does have moderate dysarthria but intelligible.     Results for orders placed or performed during the hospital encounter of 05/20/23 (from the past 48 hour(s))  Glucose, capillary     Status: Abnormal   Collection Time: 05/22/23  7:48 AM  Result Value Ref Range   Glucose-Capillary 118 (H) 70 - 99 mg/dL    Comment: Glucose reference range applies only to samples taken after fasting for at least 8 hours.  Glucose, capillary     Status: Abnormal   Collection Time: 05/22/23 11:45 AM  Result Value Ref Range   Glucose-Capillary 127 (H) 70 - 99 mg/dL    Comment: Glucose reference range applies only to samples taken after fasting for at least 8 hours.  Glucose, capillary     Status: Abnormal   Collection Time: 05/22/23  3:50 PM  Result Value Ref Range   Glucose-Capillary 139 (H) 70 - 99 mg/dL    Comment: Glucose reference range applies only to samples taken after fasting for at least 8 hours.  Glucose, capillary     Status: Abnormal   Collection Time: 05/22/23  9:35 PM  Result Value Ref Range   Glucose-Capillary 201 (H) 70 - 99 mg/dL    Comment: Glucose reference range applies only to samples taken after fasting for at least 8 hours.  Glucose, capillary     Status: Abnormal   Collection Time: 05/23/23 12:15 AM  Result Value Ref Range   Glucose-Capillary 133 (H) 70 - 99 mg/dL    Comment: Glucose reference range applies only to samples taken  after fasting for at least 8 hours.  Glucose, capillary     Status: Abnormal   Collection Time: 05/23/23  4:00 AM  Result Value Ref Range   Glucose-Capillary 116 (H) 70 - 99 mg/dL    Comment: Glucose reference range applies only to samples taken after fasting for at least 8 hours.  Basic metabolic panel     Status: Abnormal   Collection Time: 05/23/23  4:46 AM  Result Value Ref Range   Sodium 136 135 - 145 mmol/L   Potassium 3.7 3.5 - 5.1 mmol/L   Chloride 97 (L) 98 - 111 mmol/L   CO2 28 22 - 32 mmol/L   Glucose, Bld 109 (H) 70 - 99 mg/dL    Comment: Glucose reference range applies only to samples taken after fasting for at least 8 hours.   BUN 36 (H) 8 - 23 mg/dL   Creatinine, Ser 6.57 (H) 0.44 -  1.00 mg/dL   Calcium 7.9 (L) 8.9 - 10.3 mg/dL   GFR, Estimated 11 (L) >60 mL/min    Comment: (NOTE) Calculated using the CKD-EPI Creatinine Equation (2021)    Anion gap 11 5 - 15    Comment: Performed at [facility], [address], [place], [place] [address]  CBC     Status: Abnormal   Collection Time: 05/23/23  4:46 AM  Result Value Ref Range   WBC 6.6 4.0 - 10.5 K/uL   RBC 2.97 (L) 3.87 - 5.11 MIL/uL   Hemoglobin 8.4 (L) 12.0 - 15.0 g/dL   HCT 69.6 (L) 29.5 - 28.4 %   MCV 88.9 80.0 - 100.0 fL   MCH 28.3 26.0 - 34.0 pg   MCHC 31.8 30.0 - 36.0 g/dL   RDW 13.2 44.0 - 10.2 %   Platelets 186 150 - 400 K/uL   nRBC 0.0 0.0 - 0.2 %    Comment: Performed at [facility], [address], [place], [place] [address]  Glucose, capillary     Status: Abnormal   Collection Time: 05/23/23  8:18 AM  Result Value Ref Range   Glucose-Capillary 122 (H) 70 - 99 mg/dL    Comment: Glucose reference range applies only to samples taken after fasting for at least 8 hours.  Glucose, capillary     Status: Abnormal   Collection Time: 05/23/23 12:01 PM  Result Value Ref Range   Glucose-Capillary 189 (H) 70 - 99 mg/dL    Comment: Glucose reference range applies only to samples taken after  fasting for at least 8 hours.  Glucose, capillary     Status: Abnormal   Collection Time: 05/23/23  4:12 PM  Result Value Ref Range   Glucose-Capillary 153 (H) 70 - 99 mg/dL    Comment: Glucose reference range applies only to samples taken after fasting for at least 8 hours.  Glucose, capillary     Status: Abnormal   Collection Time: 05/23/23  8:24 PM  Result Value Ref Range   Glucose-Capillary 236 (H) 70 - 99 mg/dL    Comment: Glucose reference range applies only to samples taken after fasting for at least 8 hours.   Comment 1 Notify RN    Comment 2 Document in Chart   Glucose, capillary     Status: Abnormal   Collection Time: 05/23/23 11:07 PM  Result Value Ref Range   Glucose-Capillary 195 (H) 70 - 99 mg/dL    Comment: Glucose reference range applies only to samples taken after fasting for at least 8 hours.   Comment 1 Notify RN    Comment 2 Document in Chart   Glucose, capillary     Status: Abnormal   Collection Time: 05/24/23  3:24 AM  Result Value Ref Range   Glucose-Capillary 115 (H) 70 - 99 mg/dL    Comment: Glucose reference range applies only to samples taken after fasting for at least 8 hours.   Comment 1 Notify RN    Comment 2 Document in Chart    DG Swallowing Func-Speech Pathology  Result Date: 05/22/2023 Table formatting from the original result was not included. Modified Barium Swallow Study Patient Details Name: CRYSTI MANIGO MRN: [id number] Date of Birth: 02-08-1956 Today's Date: 05/22/2023 HPI/PMH: HPI: JOLINE SLOMINSKI is a 67 y.o. female  has a past medical history of CAD (coronary artery disease), Hypercholesteremia, Hypertension, Hypothyroidism, Kidney disease, Renal disorder, Stroke (HCC) (10/2018), Thyroid disease, and Type 2 diabetes mellitus (HCC) (10/08/2018).  who presents with acute onset complete expressive aphasia and worsening left-sided weakness s/p TNK at [facility], transferred to [facility] for further care.  Most recent CT head with acute hemorrhage dorsal  pons/brachium pontis. Clinical Impression: Clinical Impression: Pt has an oral more than pharyngeal dysphagia. She does not have full labial seal on her R side and does best getting liquids via straw, both in terms of getting more liquid into her mouth as well as keeping it in there. She has relatively good lingual hold and posterior transit, although movement becomes more repetitive and disorganized as boluses become more solid. She does not fully masticate a small piece of cracker, with most of it coming back out of her oral cavity. Oral residue is pretty mild across consistencies but she cannot clear a barium tablet, which she then expectorated. Her pharyngeal strength and clearance are intact, but she has intermittent delays in initiating her swallow wtih solid consistencies more than liquids. No aspiration occurs. Recommend starting Dys 1 (puree) diet and thin liquids. Factors that may increase risk of adverse event in presence of aspiration Rubye Oaks & Clearance Coots 2021): Factors that may increase risk of adverse event in presence of aspiration Rubye Oaks & Clearance Coots 2021): Limited mobility Recommendations/Plan: Swallowing Evaluation Recommendations Swallowing Evaluation Recommendations Recommendations: PO diet PO Diet Recommendation: Dysphagia 1 (Pureed); Thin liquids (Level 0) Liquid Administration via: Straw Medication Administration: Crushed with puree Supervision: Patient able to self-feed; Full supervision/cueing for swallowing strategies Swallowing strategies  : Minimize environmental distractions; Slow rate; Small bites/sips; Check for pocketing or oral holding; Check for anterior loss Postural changes: Position pt fully upright for meals Oral care recommendations: Oral care BID (2x/day) Caregiver Recommendations: Have oral suction available Treatment Plan Treatment Plan Treatment recommendations: Therapy as outlined in treatment plan below Follow-up recommendations: Acute inpatient rehab (3 hours/day) Functional  status assessment: Patient has had a recent decline in their functional status and demonstrates the ability to make significant improvements in function in a reasonable and predictable amount of time. Treatment frequency: Min 2x/week Treatment duration: 2 weeks Interventions: Aspiration precaution training; Compensatory techniques; Patient/family education; Trials of upgraded texture/liquids; Diet toleration management by SLP Recommendations Recommendations for follow up therapy are one component of a multi-disciplinary discharge planning process, led by the attending physician.  Recommendations may be updated based on patient status, additional functional criteria and insurance authorization. Assessment: Orofacial Exam: Orofacial Exam Oral Cavity - Dentition: Missing dentition Anatomy: Anatomy: WFL Boluses Administered: Boluses Administered Boluses Administered: Thin liquids (Level 0); Mildly thick liquids (Level 2, nectar thick); Moderately thick liquids (Level 3, honey thick); Puree; Solid  Oral Impairment Domain: Oral Impairment Domain Lip Closure: Escape beyond mid-chin Tongue control during bolus hold: Posterior escape of less than half of bolus Bolus preparation/mastication: Disorganized chewing/mashing with solid pieces of bolus unchewed Bolus transport/lingual motion: Repetitive/disorganized tongue motion Oral residue: Trace residue lining oral structures Location of oral residue : Tongue; Lateral sulci Initiation of pharyngeal swallow : Pyriform sinuses  Pharyngeal Impairment Domain: Pharyngeal Impairment Domain Soft palate elevation: No bolus between soft palate (SP)/pharyngeal wall (PW) Laryngeal elevation: Complete superior movement of thyroid cartilage with complete approximation of arytenoids to epiglottic petiole Anterior hyoid excursion: Complete anterior movement Epiglottic movement: Complete inversion Laryngeal vestibule closure: Complete, no air/contrast in laryngeal vestibule Pharyngeal  stripping wave : Present - complete Pharyngeal contraction (A/P view only): N/A Pharyngoesophageal segment opening: Complete distension and complete duration, no obstruction of flow Tongue base retraction: No contrast between tongue base and posterior pharyngeal wall (PPW) Pharyngeal residue:  Complete pharyngeal clearance Location of pharyngeal residue: N/A  Esophageal Impairment Domain: Esophageal Impairment Domain Esophageal clearance upright position: Complete clearance, esophageal coating Pill: Pill Consistency administered: Thin liquids (Level 0); Puree Thin liquids (Level 0): Impaired (see clinical impressions) Puree: Impaired (see clinical impressions) Penetration/Aspiration Scale Score: Penetration/Aspiration Scale Score 1.  Material does not enter airway: Mildly thick liquids (Level 2, nectar thick); Moderately thick liquids (Level 3, honey thick); Puree; Solid 2.  Material enters airway, remains ABOVE vocal cords then ejected out: Thin liquids (Level 0) Compensatory Strategies: No data recorded  General Information: Caregiver present: No  Diet Prior to this Study: NPO   Temperature : Normal   Respiratory Status: WFL   Supplemental O2: None (Room air)   History of Recent Intubation: No  Behavior/Cognition: Alert; Cooperative; Pleasant mood No data recorded Baseline vocal quality/speech: Hypophonia/low volume No data recorded Volitional Swallow: Able to elicit Exam Limitations: No limitations Goal Planning: Prognosis for improved oropharyngeal function: Good No data recorded No data recorded Patient/Family Stated Goal: None stated Consulted and agree with results and recommendations: Patient Pain: Pain Assessment Pain Assessment: Faces Faces Pain Scale: 0 Facial Expression: 0 Body Movements: 0 Muscle Tension: 0 Compliance with ventilator (intubated pts.): N/A Vocalization (extubated pts.): 0 CPOT Total: 0 Pain Location: head Pain Descriptors / Indicators: Aching Pain Intervention(s): Patient requesting pain  meds-RN notified End of Session: Start Time:SLP Start Time (ACUTE ONLY): 1512 Stop Time: SLP Stop Time (ACUTE ONLY): 1529 Time Calculation:SLP Time Calculation (min) (ACUTE ONLY): 17 min Charges: SLP Evaluations $ SLP Speech Visit: 1 Visit SLP Evaluations $BSS Swallow: 1 Procedure $MBS Swallow: 1 Procedure SLP visit diagnosis: SLP Visit Diagnosis: Dysphagia, unspecified (R13.10) Past Medical History: Past Medical History: Diagnosis Date  CAD (coronary artery disease)   cath 5/23 100% dist RCA lesion treated with 2 overlapping Integrity Resolute DES ( 2.25x12mm, 2.25x12mm), 60% mid RCA treated medically, 99% OM2 not amenable to PCI, 70% D1 lesion, EF normal  Hypercholesteremia   Hypertension   Hypothyroidism   Kidney disease   Renal disorder   Stroke (HCC) 10/2018  Thyroid disease   Type 2 diabetes mellitus (HCC) 10/08/2018 Past Surgical History: Past Surgical History: Procedure Laterality Date  ABDOMINAL HYSTERECTOMY    AV FISTULA PLACEMENT Right 06/28/2022  Procedure: RIGHT ARM ARTERIOVENOUS (AV) FISTULA CREATION;  Surgeon: Larina Earthly, MD;  Location: [facility];  Service: Vascular;  Laterality: Right;  CARDIAC CATHETERIZATION N/A 11/03/2015  Procedure: Left Heart Cath and Coronary Angiography;  Surgeon: Marykay Lex, MD;  Location: [facility] INVASIVE CV LAB;  Service: Cardiovascular;  Laterality: N/A;  CARDIAC CATHETERIZATION N/A 11/03/2015  Procedure: Coronary Stent Intervention;  Surgeon: Marykay Lex, MD;  Location: [facility] INVASIVE CV LAB;  Service: Cardiovascular;  Laterality: N/A;  CARPAL TUNNEL RELEASE    CORONARY ANGIOPLASTY    1 stent  FISTULA SUPERFICIALIZATION Right 08/16/2022  Procedure: RIGHT ARTERIOVENOUS FISTULA SUPERFICIALIZATION;  Surgeon: Larina Earthly, MD;  Location: [facility];  Service: Vascular;  Laterality: Right;  TOTAL KNEE ARTHROPLASTY Right 04/02/2019  Procedure: TOTAL KNEE ARTHROPLASTY;  Surgeon: Vickki Hearing, MD;  Location: [facility];  Service: Orthopedics;  Laterality: Right; Mahala Menghini., M.A.  CCC-SLP Acute Rehabilitation Services Office [phone] Secure chat preferred 05/22/2023, 4:07 PM     Blood pressure (!) 148/51, pulse 95, temperature 99 F (37.2 C), temperature source Oral, resp. rate 16, height 5\' 7"  (1.702 m), weight 87.9 kg, SpO2 93%.  Medical Problem List and Plan: 1. 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  Antithrombotics: -DVT/anticoagulation:  Pharmaceutical: Heparin  -antiplatelet therapy: N/A 3. Pain Management: All as needed 4. Mood/Behavior/Sleep: Namenda 10 mg twice daily  -antipsychotic agents: N/A 5. Neuropsych/cognition: This patient is capable of making decisions on her own behalf. 6. Skin/Wound Care: Routine skin checks 7. Fluids/Electrolytes/Nutrition: Routine in and outs with follow-up chemistries 8.  CAD with stenting.  No chest pain or shortness of breath.  Patient on low-dose aspirin prior to admission 9.  End-stage renal disease with hemodialysis.  Follow-up per renal services 10.  Hypotension with hemodialysis.  Amlodipine 10 mg daily, Coreg 12.5 mg twice daily and Lasix 80 mg daily currently on hold.  Chart review shows patient also on ProAmatine 10 mg daily prior to admission 11.  Diabetes mellitus.  Hemoglobin A1c 6.2.  Currently on SSI. 12.  Hyperlipidemia.  Lipitor 13.  Dysphagia.  Dysphagia #1 thin liquid diet.  Follow-up speech therapy 14.  Obesity.  BMI 30.35.  Dietary follow-up Charlton Amor, PA-C 05/24/2023

## 2023-05-24 NOTE — Progress Notes (Signed)
D/C order noted. Contacted [facility] home therapy dept to be advised of pt's d/c today to home.   Olivia Canter Renal Navigator [phone]

## 2023-05-24 NOTE — Discharge Instructions (Addendum)
Ms. Osterkamp, you came to the hospital with aphasia and left sided weakness. You were given TNK to treat your stroke.  However, you developed a hemorrhagic stroke in the right pons, and the TNK was reversed.  This hemorrhage was stable on your CT and MRI.  We have restarted your daily aspirin to prevent a stroke in the future.  You have been evaluated by PT, OT and speech therapy and are now ready to discharge home with home health therapy.  You will need to wear a 30 day cardiac monitor and follow up in the stroke clinic in 8 weeks.

## 2023-05-24 NOTE — Progress Notes (Signed)
Received back from dialysis in bed.  Family at bedside awaiting her return.  Asked that I page MD because pt and family want her to be able to go home today.  Said she feels, and they do too, that she will do much better at home in her own environment.

## 2023-05-24 NOTE — Progress Notes (Signed)
   Inpatient Rehabilitation Admissions Coordinator   I contacted pt's daughter, [name], by phone. Patient in hemodialysis. I discussed dispo options of Home with HH vs CIR as soon as bed is available. Shanon will discuss with her siblings and Dad and follow up with me today. Acute team and TOC made aware.  Ottie Glazier, RN, MSN Rehab Admissions Coordinator [phone] 05/24/2023 11:38 AM

## 2023-05-24 NOTE — Progress Notes (Signed)
Ordered 30 day event monitor for further evaluation of stroke at the request of Neurology. Dr. Jenene Slicker to read.   Corrin Parker, PA-C 05/24/2023 2:17 PM

## 2023-05-24 NOTE — TOC Transition Note (Signed)
Transition of Care [facility]) - CM/SW Discharge Note   Patient Details  Name: Jennifer [name] MRN: [id number] Date of Birth: 1955/10/07  Transition of Care [facility]) CM/SW Contact:  Kermit Balo, RN Phone Number: 05/24/2023, 2:07 PM   Clinical Narrative:     Family prefers discharge home with home health services. CM met with the patient and her granddaughter. Choice provided with [email or web address]. They have used [facility] in the past and asked to use them again. Information on the AVS. [facility] will contact them for the first home visit. Pt has needed DME at home. Granddaughter denies issues with transportation or home medications. Family is transporting her home.  Final next level of care: Home w Home Health Services Barriers to Discharge: No Barriers Identified   Patient Goals and CMS Choice CMS Medicare.gov Compare Post Acute Care list provided to:: Patient Represenative (must comment) Choice offered to / list presented to : Adult Children  Discharge Placement                         Discharge Plan and Services Additional resources added to the After Visit Summary for                            [facility] Arranged: PT, OT, Speech Therapy HH Agency: [facility] Date [facility] Agency Contacted: 05/24/23   Representative spoke with at [facility] Agency: Adele Dan  Social Determinants of Health (SDOH) Interventions SDOH Screenings   Food Insecurity: No Food Insecurity (05/21/2023)  Housing: Low Risk  (05/21/2023)  Transportation Needs: No Transportation Needs (05/21/2023)  Utilities: Not At Risk (05/21/2023)  Alcohol Screen: Low Risk  (03/07/2023)  Depression (PHQ2-9): Low Risk  (03/07/2023)  Financial Resource Strain: Low Risk  (03/07/2023)  Physical Activity: Inactive (03/07/2023)  Social Connections: Socially Integrated (03/07/2023)  Stress: No Stress Concern Present (03/07/2023)  Tobacco Use: Medium Risk (05/22/2023)  Health Literacy: Adequate Health Literacy (03/07/2023)      Readmission Risk Interventions     No data to display

## 2023-05-25 ENCOUNTER — Telehealth: Payer: Self-pay

## 2023-05-25 NOTE — Transitions of Care (Post Inpatient/ED Visit) (Signed)
05/25/2023  Name: Jennifer [name] MRN: [id number] DOB: 08-19-1955  Today's TOC FU Call Status: Today's TOC FU Call Status:: Successful TOC FU Call Completed TOC FU Call Complete Date: 05/25/23 Patient's Name and Date of Birth confirmed.  Transition Care Management Follow-up Telephone Call Date of Discharge: 05/24/23 Discharge Facility: [facility]) Type of Discharge: Inpatient Admission Primary Inpatient Discharge Diagnosis:: Cerebral Infarct How have you been since you were released from the hospital?: Better Any questions or concerns?: No  Items Reviewed: Did you receive and understand the discharge instructions provided?: Yes Medications obtained,verified, and reconciled?: Yes (Medications Reviewed) Any new allergies since your discharge?: No Dietary orders reviewed?: Yes Type of Diet Ordered:: Puree Do you have support at home?: Yes People in Home: child(ren), adult Name of Support/Comfort Primary Source: Jennifer [name]  Medications Reviewed Today: Medications Reviewed Today     Reviewed by [facility], RN (Case Manager) on 05/25/23 at 1046  Med List Status: <None>   Medication Order Taking? Sig Documenting Provider Last Dose Status Informant  artificial tears (LACRILUBE) OINT ophthalmic ointment [id number]  Place into both eyes at bedtime as needed for up to 62 doses for dry eyes. de Saintclair Halsted, Cortney E, NP  Active   aspirin 81 MG chewable tablet [id number]  Chew 1 tablet (81 mg total) by mouth daily. de Saintclair Halsted, Cortney E, NP  Active   atorvastatin (LIPITOR) 80 MG tablet [id number] No TAKE 1 TABLET BY MOUTH ONCE DAILY 6 IN THE EVENING Mallipeddi, Vishnu P, MD 05/19/2023 Active Child, Pharmacy Records  BD PEN NEEDLE NANO 2ND GEN 32G X 4 MM MISC [id number] No Inject into the skin 3 (three) times daily. [provider] Taking Active Child, Pharmacy Records  buPROPion [facility] SR) 150 MG 12 hr tablet [id number] No Take 1 tablet by mouth twice daily [name], Audrie Gallus, MD  05/20/2023 Active Child, Pharmacy Records  carvedilol (COREG) 12.5 MG tablet [id number]  Take 1 tablet (12.5 mg total) by mouth daily. On non-dialysis days de Saintclair Halsted, Dewitt Hoes E, NP  Active   DULoxetine (CYMBALTA) 60 MG capsule [id number] No Take 1 capsule by mouth once daily [name], Audrie Gallus, MD 05/20/2023 Active Child, Pharmacy Records  ferrous sulfate 325 (65 FE) MG tablet [id number] No Take 325 mg by mouth as needed (given when needed at dialysis). [provider] unknown Active Child, Pharmacy Records  folic acid (FOLVITE) 1 MG tablet [id number] No Take 1 tablet by mouth once daily Micki Riley, MD 05/20/2023 Active Child, Pharmacy Records  furosemide (LASIX) 80 MG tablet [id number] No Take 80 mg by mouth daily. On non-dialysis days [provider] 05/19/2023 Active Child, Pharmacy Records  insulin lispro protamine-lispro (HUMALOG 75/25 MIX) (75-25) 100 UNIT/ML SUSP injection [id number] No Inject 10-25 Units into the skin See admin instructions. Inject 25 units with breakfast, 10 units at lunch, 25 units at dinner [provider] 05/19/2023 Active Child, Pharmacy Records           Med Note Gunnar Fusi, MELISSA R   Fri Aug 12, 2022 12:45 PM)    levothyroxine (SYNTHROID) 175 MCG tablet [id number] No 1 tablet in the morning on an empty stomach Orally Once a day [provider] 05/20/2023 Active Child, Pharmacy Records  memantine (NAMENDA) 10 MG tablet [id number]  Take 0.5 tablets (5 mg total) by mouth 2 (two) times daily. de Saintclair Halsted, Cortney E, NP  Active   midodrine (PROAMATINE) 10 MG tablet [id number] No Take 10 mg by mouth daily. 30 min-1 hr  prior to HD on hemodialysis days [provider] Past Week Active Child, Pharmacy Records  nitroGLYCERIN (NITROSTAT) 0.4 MG SL tablet [id number] No Place 0.4 mg under the tongue every 5 (five) minutes as needed for chest pain. [provider] unknown Active Child, Pharmacy Records  polyvinyl alcohol (LIQUIFILM TEARS) 1.4 %  ophthalmic solution [id number]  Place 1 drop into both eyes every 4 (four) hours. de Saintclair Halsted, Lennox Solders, NP  Active             Home Care and Equipment/Supplies: Were Home Health Services Ordered?: Yes Name of Home Health Agency:: [facility] Has Agency set up a time to come to your home?: Yes First Home Health Visit Date: 05/25/23 Any new equipment or medical supplies ordered?: No  Functional Questionnaire: Do you need assistance with bathing/showering or dressing?: Yes Do you need assistance with meal preparation?: Yes Do you need assistance with eating?: No Do you have difficulty maintaining continence: Yes Do you need assistance with getting out of bed/getting out of a chair/moving?: Yes Do you have difficulty managing or taking your medications?: Yes  Follow up appointments reviewed: PCP Follow-up appointment confirmed?: Yes Date of PCP follow-up appointment?: 05/30/23 Follow-up Provider: [facility] Follow-up appointment confirmed?: Yes Date of Specialist follow-up appointment?: 06/01/23 Follow-Up Specialty Provider:: Dr. Mikal Plane, Cardiologist Do you need transportation to your follow-up appointment?: No Do you understand care options if your condition(s) worsen?: Yes-patient verbalized understanding  SDOH Interventions Today    Flowsheet Row Most Recent Value  SDOH Interventions   Food Insecurity Interventions Intervention Not Indicated  Housing Interventions Intervention Not Indicated  Transportation Interventions Intervention Not Indicated  Utilities Interventions Intervention Not Indicated      Outreach completed to the patient today. The patient's daughter anwers the phone due to her mother's slurred speech. Reviewed the discharge summary and home health orders. Questions about a cardiac monitor being mailed to the house. Home Health team to assist with instructions on the process. The daughter states her mother is basically at baseline which  is minimal. She takes a few steps and the stand pivots for the toilet. She is incontinent at times during the day and night. Her current diet is puree due to swallowing problems. The patient's daughter declines further outreach calls. She states her mother has been somewhat debilitated for a while and the family can take care of her.   The patient has been provided with contact information for the care management team and has been advised to call with any health-related questions or concerns. The patient verbalized understanding with current POC. The patient is directed to their insurance card regarding availability of benefits coverage.  Deidre Ala, RN Medical illustrator [facility] [phone]

## 2023-05-26 ENCOUNTER — Telehealth: Payer: Self-pay | Admitting: Family Medicine

## 2023-05-26 NOTE — Telephone Encounter (Signed)
Please ok that verbal order  

## 2023-05-26 NOTE — Telephone Encounter (Signed)
Home Health verbal orders Caller Name:chris Agency Name: [facility] number: [phone]  Requesting PT  Reason:  Frequency:1 x wk 1 2 x wk 1 1 x wk 1 2 x wk 4 1 x wk 2  Please forward to [facility] pool or providers CMA

## 2023-05-26 NOTE — Telephone Encounter (Signed)
VO given to [facility]

## 2023-05-30 ENCOUNTER — Ambulatory Visit (INDEPENDENT_AMBULATORY_CARE_PROVIDER_SITE_OTHER): Payer: Medicare Other | Admitting: Family Medicine

## 2023-05-30 ENCOUNTER — Encounter: Payer: Self-pay | Admitting: Family Medicine

## 2023-05-30 VITALS — BP 122/64 | Temp 98.0°F

## 2023-05-30 DIAGNOSIS — D649 Anemia, unspecified: Secondary | ICD-10-CM

## 2023-05-30 DIAGNOSIS — Z8673 Personal history of transient ischemic attack (TIA), and cerebral infarction without residual deficits: Secondary | ICD-10-CM

## 2023-05-30 DIAGNOSIS — I5032 Chronic diastolic (congestive) heart failure: Secondary | ICD-10-CM | POA: Diagnosis not present

## 2023-05-30 DIAGNOSIS — E1165 Type 2 diabetes mellitus with hyperglycemia: Secondary | ICD-10-CM

## 2023-05-30 DIAGNOSIS — J449 Chronic obstructive pulmonary disease, unspecified: Secondary | ICD-10-CM

## 2023-05-30 DIAGNOSIS — I10 Essential (primary) hypertension: Secondary | ICD-10-CM

## 2023-05-30 DIAGNOSIS — Z794 Long term (current) use of insulin: Secondary | ICD-10-CM

## 2023-05-30 DIAGNOSIS — R8290 Unspecified abnormal findings in urine: Secondary | ICD-10-CM

## 2023-05-30 DIAGNOSIS — E782 Mixed hyperlipidemia: Secondary | ICD-10-CM

## 2023-05-30 DIAGNOSIS — N186 End stage renal disease: Secondary | ICD-10-CM

## 2023-05-30 MED ORDER — CEPHALEXIN 500 MG PO CAPS: ORAL_CAPSULE | ORAL | 0 refills | Status: DC

## 2023-05-30 MED ORDER — ARTIFICIAL TEARS OPHTHALMIC OINT: TOPICAL_OINTMENT | Freq: Every evening | OPHTHALMIC | 2 refills | Status: DC | PRN

## 2023-05-30 MED ORDER — POLYVINYL ALCOHOL 1.4 % OP SOLN: 1.0000 [drp] | OPHTHALMIC | 0 refills | Status: DC

## 2023-05-30 NOTE — Assessment & Plan Note (Signed)
Pulse ox fluctuates  No wheezing or shortness of breath  No medications currently

## 2023-05-30 NOTE — Assessment & Plan Note (Signed)
Sees endocrinology  Lab Results  Component Value Date   HGBA1C 6.2 (H) 05/20/2023   With ESRD on dialysis

## 2023-05-30 NOTE — Assessment & Plan Note (Signed)
Resumed statin on hosp d/c Disc goals for lipids and reasons to control them Rev last labs with pt Rev low sat fat diet in detail

## 2023-05-30 NOTE — Assessment & Plan Note (Signed)
Pt is totally incontinent and unable due to dementia to urinate in toilet or for collection  We do not have ability to cath Family would rather not refer to urology  Encouraged fluids Will treatment empirically for uti with keflex 500 mg (after dialysis for 5 doses) Update if not starting to improve in a week or if worsening

## 2023-05-30 NOTE — Progress Notes (Signed)
Subjective:    Patient ID: Jennifer [name], female    DOB: [birth year], 67 y.o.   MRN: [id number]  HPI  Wt Readings from Last 3 Encounters:  05/24/23 193 lb 12.6 oz (87.9 kg)  03/07/23 218 lb (98.9 kg)  11/25/22 226 lb (102.5 kg)      Vitals:   05/30/23 1048 05/30/23 1139  BP: 122/64   Temp: 98 F (36.7 C)   SpO2: (!) 85% 94%   Per family - her 02 stays low during dialysis at home  (with fistula in arm)  More hallucinating  Very tired  Gives iron with dialysis treatment   Sees the doctor at the dialysis center   Dementia is worse     Pt presents for follow up of CVA  Right pons with TNK and hemorrhagic transformation  S/p reversal with TXA and cryo  Due to large vessel dz vs cardio embolic source    Per d/c summary   Expand All Collapse All  Stroke Discharge Summary  Patient ID:      Jennifer [name]                         MRN: [id number]                                                         DOB: [birth year]   Date of Admission: 05/20/2023 Date of Discharge: 05/24/2023   Attending Physician:  Stroke, Md, MD Consultant(s):   Treatment Team:  Dagoberto Ligas, MD pulmonary/intensive care and nephrology  Patient's PCP:  Jennifer Pimple, MD   DISCHARGE PRIMARY DIAGNOSIS:   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 Cardioembolic source    Secondary Diagnoses: Hypertension Hyperlipidemia Diabetes Type II Hx of stroke ESRD on HD Dysphagia  CAD Dementia    Allergies as of 05/24/2023         Reactions    Metformin Hcl Other (See Comments)    Unknown reaction             Medication List       STOP taking these medications     amLODipine 10 MG tablet Commonly known as: NORVASC    aspirin EC 81 MG tablet Replaced by: aspirin 81 MG chewable tablet           TAKE these medications      artificial tears Oint ophthalmic ointment Commonly known as: LACRILUBE Place into both eyes at bedtime as needed for up to 62 doses for dry eyes.    aspirin 81 MG chewable tablet Chew 1 tablet (81 mg total) by mouth daily. Replaces: aspirin EC 81 MG tablet    atorvastatin 80 MG tablet Commonly known as: LIPITOR TAKE 1 TABLET BY MOUTH ONCE DAILY 6 IN THE EVENING    BD Pen Needle Nano 2nd Gen 32G X 4 MM Misc Generic drug: Insulin Pen Needle Inject into the  skin 3 (three) times daily.    buPROPion 150 MG 12 hr tablet Commonly known as: WELLBUTRIN SR Take 1 tablet by mouth twice daily    carvedilol 12.5 MG tablet Commonly known as: COREG Take 1 tablet (12.5 mg total) by mouth daily. On non-dialysis days    DULoxetine 60 MG capsule Commonly known as: CYMBALTA Take 1 capsule by mouth once daily    ferrous sulfate 325 (65 FE) MG tablet Take 325 mg by mouth as needed (given when needed at dialysis).    folic acid 1 MG tablet Commonly known as: FOLVITE Take 1 tablet by mouth once daily    furosemide 80 MG tablet Commonly known as: LASIX Take 80 mg by mouth daily. On non-dialysis days    insulin lispro protamine-lispro (75-25) 100 UNIT/ML Susp injection Commonly known as: HUMALOG 75/25 MIX Inject 10-25 Units into the skin See admin instructions. Inject 25 units with breakfast, 10 units at lunch, 25 units at dinner    levothyroxine 175 MCG tablet Commonly known as: SYNTHROID 1 tablet in the morning on an empty stomach Orally Once a day    memantine 10 MG tablet Commonly known as: Namenda Take 0.5 tablets (5 mg total) by mouth 2 (two) times daily. What changed: how much to take    midodrine 10 MG tablet Commonly known as: PROAMATINE Take 10 mg by mouth daily. 30 min-1 hr prior to HD on hemodialysis days    nitroGLYCERIN 0.4 MG SL tablet Commonly known as: NITROSTAT Place 0.4 mg under the tongue every 5 (five) minutes as needed for chest pain.    polyvinyl alcohol 1.4  % ophthalmic solution Commonly known as: LIQUIFILM TEARS Place 1 drop into both eyes every 4 (four) hours.             LABORATORY STUDIES CBC Labs (Brief)          Component Value Date/Time    WBC 8.9 05/24/2023 0548    RBC 3.15 (L) 05/24/2023 0548    HGB 8.8 (L) 05/24/2023 0548    HGB 11.6 05/20/2020 1453    HCT 27.8 (L) 05/24/2023 0548    HCT 35.8 05/20/2020 1453    PLT 198 05/24/2023 0548    PLT 276 05/20/2020 1453    MCV 88.3 05/24/2023 0548    MCV 84 05/20/2020 1453    MCH 27.9 05/24/2023 0548    MCHC 31.7 05/24/2023 0548    RDW 15.1 05/24/2023 0548    RDW 13.5 05/20/2020 1453    LYMPHSABS 1.1 05/20/2023 1222    LYMPHSABS 1.7 05/20/2020 1453    MONOABS 0.4 05/20/2023 1222    EOSABS 0.1 05/20/2023 1222    EOSABS 0.1 05/20/2020 1453    BASOSABS 0.1 05/20/2023 1222    BASOSABS 0.1 05/20/2020 1453      CMP Labs (Brief)          Component Value Date/Time    NA 137 05/24/2023 0548    K 4.1 05/24/2023 0548    CL 101 05/24/2023 0548    CO2 28 05/24/2023 0548    GLUCOSE 115 (H) 05/24/2023 0548    BUN 54 (H) 05/24/2023 0548    CREATININE 5.92 (H) 05/24/2023 0548    CALCIUM 8.1 (L) 05/24/2023 0548    PROT 6.1 (L) 05/20/2023 1222    ALBUMIN 2.9 (L) 05/20/2023 1222    AST 21 05/20/2023 1222    ALT 19 05/20/2023 1222    ALKPHOS 53 05/20/2023 1222    BILITOT 0.6 05/20/2023  1222    GFRNONAA 7 (L) 05/24/2023 0548    GFRAA 43 (L) 02/05/2020 2019      COAGS Recent Labs       Lab Results  Component Value Date    INR 1.1 05/20/2023    INR 0.99 11/14/2017    INR 1.68 (H) 11/04/2015      Lipid Panel Labs (Brief)          Component Value Date/Time    CHOL 163 05/22/2023 0419    TRIG 119 05/22/2023 0419    HDL 37 (L) 05/22/2023 0419    CHOLHDL 4.4 05/22/2023 0419    VLDL 24 05/22/2023 0419    LDLCALC 102 (H) 05/22/2023 0419      HgbA1C  Recent Labs       Lab Results  Component Value Date    HGBA1C 6.2 (H) 05/20/2023      Urine Drug Screen  negative Alcohol Level Labs (Brief)[] Expand by Default          Component Value Date/Time    [facility] <10 05/20/2023 1222          SIGNIFICANT DIAGNOSTIC STUDIES  Imaging Results  DG Swallowing Func-Speech Pathology   Result Date: 05/22/2023 Table formatting from the original result was not included. Modified Barium Swallow Study Patient Details Name: Jennifer [name] MRN: [id number] Date of Birth: [birth year] Today's Date: 05/22/2023 HPI/PMH: HPI: Jennifer [name] is a 67 y.o. female  has a past medical history of CAD (coronary artery disease), Hypercholesteremia, Hypertension, Hypothyroidism, Kidney disease, Renal disorder, Stroke (HCC) (10/2018), Thyroid disease, and Type 2 diabetes mellitus (HCC) (10/08/2018). who presents with acute onset complete expressive aphasia and worsening left-sided weakness s/p TNK at [facility], transferred to [facility] for further care.  Most recent CT head with acute hemorrhage dorsal pons/brachium pontis. Clinical Impression: Clinical Impression: Pt has an oral more than pharyngeal dysphagia. She does not have full labial seal on her R side and does best getting liquids via straw, both in terms of getting more liquid into her mouth as well as keeping it in there. She has relatively good lingual hold and posterior transit, although movement becomes more repetitive and disorganized as boluses become more solid. She does not fully masticate a small piece of cracker, with most of it coming back out of her oral cavity. Oral residue is pretty mild across consistencies but she cannot clear a barium tablet, which she then expectorated. Her pharyngeal strength and clearance are intact, but she has intermittent delays in initiating her swallow wtih solid consistencies more than liquids. No aspiration occurs. Recommend starting Dys 1 (puree) diet and thin liquids. Factors that may increase risk of adverse event in presence of aspiration Rubye Oaks & Clearance Coots 2021): Factors that may increase risk of  adverse event in presence of aspiration Rubye Oaks & Clearance Coots 2021): Limited mobility Recommendations/Plan: Swallowing Evaluation Recommendations Swallowing Evaluation Recommendations Recommendations: PO diet PO Diet Recommendation: Dysphagia 1 (Pureed); Thin liquids (Level 0) Liquid Administration via: Straw Medication Administration: Crushed with puree Supervision: Patient able to self-feed; Full supervision/cueing for swallowing strategies Swallowing strategies  : Minimize environmental distractions; Slow rate; Small bites/sips; Check for pocketing or oral holding; Check for anterior loss Postural changes: Position pt fully upright for meals Oral care recommendations: Oral care BID (2x/day) Caregiver Recommendations: Have oral suction available Treatment Plan Treatment Plan Treatment recommendations: Therapy as outlined in treatment plan below Follow-up recommendations: Acute inpatient rehab (3 hours/day) Functional status assessment: Patient has had a recent decline in their functional  status and demonstrates the ability to make significant improvements in function in a reasonable and predictable amount of time. Treatment frequency: Min 2x/week Treatment duration: 2 weeks Interventions: Aspiration precaution training; Compensatory techniques; Patient/family education; Trials of upgraded texture/liquids; Diet toleration management by SLP Recommendations Recommendations for follow up therapy are one component of a multi-disciplinary discharge planning process, led by the attending physician.  Recommendations may be updated based on patient status, additional functional criteria and insurance authorization. Assessment: Orofacial Exam: Orofacial Exam Oral Cavity - Dentition: Missing dentition Anatomy: Anatomy: WFL Boluses Administered: Boluses Administered Boluses Administered: Thin liquids (Level 0); Mildly thick liquids (Level 2, nectar thick); Moderately thick liquids (Level 3, honey thick); Puree; Solid  Oral  Impairment Domain: Oral Impairment Domain Lip Closure: Escape beyond mid-chin Tongue control during bolus hold: Posterior escape of less than half of bolus Bolus preparation/mastication: Disorganized chewing/mashing with solid pieces of bolus unchewed Bolus transport/lingual motion: Repetitive/disorganized tongue motion Oral residue: Trace residue lining oral structures Location of oral residue : Tongue; Lateral sulci Initiation of pharyngeal swallow : Pyriform sinuses  Pharyngeal Impairment Domain: Pharyngeal Impairment Domain Soft palate elevation: No bolus between soft palate (SP)/pharyngeal wall (PW) Laryngeal elevation: Complete superior movement of thyroid cartilage with complete approximation of arytenoids to epiglottic petiole Anterior hyoid excursion: Complete anterior movement Epiglottic movement: Complete inversion Laryngeal vestibule closure: Complete, no air/contrast in laryngeal vestibule Pharyngeal stripping wave : Present - complete Pharyngeal contraction (A/P view only): N/A Pharyngoesophageal segment opening: Complete distension and complete duration, no obstruction of flow Tongue base retraction: No contrast between tongue base and posterior pharyngeal wall (PPW) Pharyngeal residue: Complete pharyngeal clearance Location of pharyngeal residue: N/A  Esophageal Impairment Domain: Esophageal Impairment Domain Esophageal clearance upright position: Complete clearance, esophageal coating Pill: Pill Consistency administered: Thin liquids (Level 0); Puree Thin liquids (Level 0): Impaired (see clinical impressions) Puree: Impaired (see clinical impressions) Penetration/Aspiration Scale Score: Penetration/Aspiration Scale Score 1.  Material does not enter airway: Mildly thick liquids (Level 2, nectar thick); Moderately thick liquids (Level 3, honey thick); Puree; Solid 2.  Material enters airway, remains ABOVE vocal cords then ejected out: Thin liquids (Level 0) Compensatory Strategies: No data recorded   General Information: Caregiver present: No  Diet Prior to this Study: NPO   Temperature : Normal   Respiratory Status: WFL   Supplemental O2: None (Room air)   History of Recent Intubation: No  Behavior/Cognition: Alert; Cooperative; Pleasant mood No data recorded Baseline vocal quality/speech: Hypophonia/low volume No data recorded Volitional Swallow: Able to elicit Exam Limitations: No limitations Goal Planning: Prognosis for improved oropharyngeal function: Good No data recorded No data recorded Patient/Family Stated Goal: None stated Consulted and agree with results and recommendations: Patient Pain: Pain Assessment Pain Assessment: Faces Faces Pain Scale: 0 Facial Expression: 0 Body Movements: 0 Muscle Tension: 0 Compliance with ventilator (intubated pts.): N/A Vocalization (extubated pts.): 0 CPOT Total: 0 Pain Location: head Pain Descriptors / Indicators: Aching Pain Intervention(s): Patient requesting pain meds-RN notified End of Session: Start Time:SLP Start Time (ACUTE ONLY): 1512 Stop Time: SLP Stop Time (ACUTE ONLY): 1529 Time Calculation:SLP Time Calculation (min) (ACUTE ONLY): 17 min Charges: SLP Evaluations $ SLP Speech Visit: 1 Visit SLP Evaluations $BSS Swallow: 1 Procedure $MBS Swallow: 1 Procedure SLP visit diagnosis: SLP Visit Diagnosis: Dysphagia, unspecified (R13.10) Past Medical History: Past Medical History: Diagnosis Date  CAD (coronary artery disease)   cath 5/23 100% dist RCA lesion treated with 2 overlapping Integrity Resolute DES ( 2.25x5mm, 2.25x63mm), 60% mid RCA treated medically,  99% OM2 not amenable to PCI, 70% D1 lesion, EF normal  Hypercholesteremia   Hypertension   Hypothyroidism   Kidney disease   Renal disorder   Stroke (HCC) 10/2018  Thyroid disease   Type 2 diabetes mellitus (HCC) 10/08/2018 Past Surgical History: Past Surgical History: Procedure Laterality Date  ABDOMINAL HYSTERECTOMY    AV FISTULA PLACEMENT Right 06/28/2022  Procedure: RIGHT ARM ARTERIOVENOUS (AV) FISTULA  CREATION;  Surgeon: Larina Earthly, MD;  Location: [facility];  Service: Vascular;  Laterality: Right;  CARDIAC CATHETERIZATION N/A 11/03/2015  Procedure: Left Heart Cath and Coronary Angiography;  Surgeon: Marykay Lex, MD;  Location: [facility] INVASIVE CV LAB;  Service: Cardiovascular;  Laterality: N/A;  CARDIAC CATHETERIZATION N/A 11/03/2015  Procedure: Coronary Stent Intervention;  Surgeon: Marykay Lex, MD;  Location: [facility] INVASIVE CV LAB;  Service: Cardiovascular;  Laterality: N/A;  CARPAL TUNNEL RELEASE    CORONARY ANGIOPLASTY    1 stent  FISTULA SUPERFICIALIZATION Right 08/16/2022  Procedure: RIGHT ARTERIOVENOUS FISTULA SUPERFICIALIZATION;  Surgeon: Larina Earthly, MD;  Location: [facility];  Service: Vascular;  Laterality: Right;  TOTAL KNEE ARTHROPLASTY Right 04/02/2019  Procedure: TOTAL KNEE ARTHROPLASTY;  Surgeon: Vickki Hearing, MD;  Location: [facility];  Service: Orthopedics;  Laterality: Right; Mahala Menghini., M.A. CCC-SLP Acute Rehabilitation Services Office [phone] Secure chat preferred 05/22/2023, 4:07 PM   VAS [place] LOWER EXTREMITY VENOUS (DVT)   Result Date: 05/22/2023  Lower Venous DVT Study Patient Name:  Jennifer [name]  Date of Exam:   05/21/2023 Medical Rec #: [id number]      Accession #:    [id number] Date of Birth: 25-Mar-1956     Patient Gender: F Patient Age:   33 years Exam Location:  [facility] Procedure:      VAS [place] LOWER EXTREMITY VENOUS (DVT) Referring Phys: Scheryl Marten XU --------------------------------------------------------------------------------  Indications: Pain.  Risk Factors: None identified. Limitations: Poor ultrasound/tissue interface. Comparison Study: No prior studies. Performing Technologist: Chanda Busing RVT  Examination Guidelines: A complete evaluation includes B-mode imaging, spectral Doppler, color Doppler, and power Doppler as needed of all accessible portions of each vessel. Bilateral testing is considered an integral part of a complete examination. Limited examinations  for reoccurring indications may be performed as noted. The reflux portion of the exam is performed with the patient in reverse Trendelenburg.  +---------+---------------+---------+-----------+----------+--------------+ RIGHT    CompressibilityPhasicitySpontaneityPropertiesThrombus Aging +---------+---------------+---------+-----------+----------+--------------+ CFV      Full           Yes      Yes                                 +---------+---------------+---------+-----------+----------+--------------+ SFJ      Full                                                        +---------+---------------+---------+-----------+----------+--------------+ FV Prox  Full                                                        +---------+---------------+---------+-----------+----------+--------------+ FV Mid   Full                                                        +---------+---------------+---------+-----------+----------+--------------+  FV DistalFull                                                        +---------+---------------+---------+-----------+----------+--------------+ PFV      Full                                                        +---------+---------------+---------+-----------+----------+--------------+ POP      Full           Yes      Yes                                 +---------+---------------+---------+-----------+----------+--------------+ PTV      Full                                                        +---------+---------------+---------+-----------+----------+--------------+ PERO     Full                                                        +---------+---------------+---------+-----------+----------+--------------+   +---------+---------------+---------+-----------+----------+-------------------+ LEFT     CompressibilityPhasicitySpontaneityPropertiesThrombus Aging       +---------+---------------+---------+-----------+----------+-------------------+ CFV      Full           Yes      Yes                                      +---------+---------------+---------+-----------+----------+-------------------+ SFJ      Full                                                             +---------+---------------+---------+-----------+----------+-------------------+ FV Prox  Full                                                             +---------+---------------+---------+-----------+----------+-------------------+ FV Mid   Full                                                             +---------+---------------+---------+-----------+----------+-------------------+ FV DistalFull                                                             +---------+---------------+---------+-----------+----------+-------------------+  PFV      Full                                                             +---------+---------------+---------+-----------+----------+-------------------+ POP      Full           Yes      Yes                                      +---------+---------------+---------+-----------+----------+-------------------+ PTV      Full                                                             +---------+---------------+---------+-----------+----------+-------------------+ PERO                                                  Not well visualized +---------+---------------+---------+-----------+----------+-------------------+     Summary: RIGHT: - There is no evidence of deep vein thrombosis in the lower extremity.  - No cystic structure found in the popliteal fossa.  LEFT: - There is no evidence of deep vein thrombosis in the lower extremity. However, portions of this examination were limited- see technologist comments above.  - No cystic structure found in the popliteal fossa.  *See table(s) above for measurements and  observations. Electronically signed by Coral Else MD on 05/22/2023 at 12:15:32 PM.    Final     ECHOCARDIOGRAM COMPLETE   Result Date: 05/21/2023    ECHOCARDIOGRAM REPORT   Patient Name:   Jennifer [name] Date of Exam: 05/21/2023 Medical Rec #:  [id number]     Height:       66.0 in Accession #:    [id number]    Weight:       200.6 lb Date of Birth:  Feb 06, 1956    BSA:          2.002 m Patient Age:    67 years      BP:           154/60 mmHg Patient Gender: F             HR:           78 bpm. Exam Location:  Inpatient Procedure: 2D Echo, Color Doppler and Cardiac Doppler Indications:    Stroke  History:        Patient has prior history of Echocardiogram examinations, most                 recent 05/24/2022. CAD, Stroke; Risk Factors:Diabetes and                 Hypertension. Hypothyroidism, CKD.  Sonographer:    Milda Smart Referring Phys: [id number] DEVON SHAFER IMPRESSIONS  1. Left ventricular ejection fraction, by estimation, is 60 to 65%. The left ventricle has normal function. The left ventricle has no regional wall motion abnormalities. There is mild concentric left  ventricular hypertrophy. Left ventricular diastolic parameters are consistent with Grade I diastolic dysfunction (impaired relaxation).  2. Right ventricular systolic function is normal. The right ventricular size is normal.  3. Left atrial size was mildly dilated.  4. Septum is hypermobile.  5. The mitral valve is grossly normal. No evidence of mitral valve regurgitation.  6. The aortic valve is normal in structure. Aortic valve regurgitation is not visualized.  7. The inferior vena cava is normal in size with greater than 50% respiratory variability, suggesting right atrial pressure of 3 mmHg. Conclusion(s)/Recommendation(s): Septum is hypermobile; consider repeat limited TTE with bubble study. FINDINGS  Left Ventricle: Left ventricular ejection fraction, by estimation, is 60 to 65%. The left ventricle has normal function. The left ventricle has  no regional wall motion abnormalities. The left ventricular internal cavity size was normal in size. There is  mild concentric left ventricular hypertrophy. Left ventricular diastolic parameters are consistent with Grade I diastolic dysfunction (impaired relaxation). Right Ventricle: The right ventricular size is normal. Right ventricular systolic function is normal. Left Atrium: Left atrial size was mildly dilated. Right Atrium: Right atrial size was normal in size. Pericardium: There is no evidence of pericardial effusion. Mitral Valve: The mitral valve is grossly normal. No evidence of mitral valve regurgitation. MV peak gradient, 7.7 mmHg. The mean mitral valve gradient is 4.0 mmHg. Tricuspid Valve: Tricuspid valve regurgitation is mild. Aortic Valve: The aortic valve is normal in structure. Aortic valve regurgitation is not visualized. Pulmonic Valve: Pulmonic valve regurgitation is not visualized. Aorta: The aortic root and ascending aorta are structurally normal, with no evidence of dilitation. Venous: The inferior vena cava is normal in size with greater than 50% respiratory variability, suggesting right atrial pressure of 3 mmHg. IAS/Shunts: No atrial level shunt detected by color flow Doppler.  LEFT VENTRICLE PLAX 2D LVIDd:         5.70 cm   Diastology LVIDs:         3.20 cm   LV e' medial:    6.31 cm/s LV PW:         1.10 cm   LV E/e' medial:  12.7 LV IVS:        1.10 cm   LV e' lateral:   6.09 cm/s LVOT diam:     2.00 cm   LV E/e' lateral: 13.2 LV SV:         91 LV SV Index:   45 LVOT Area:     3.14 cm  RIGHT VENTRICLE             IVC RV Basal diam:  4.10 cm     IVC diam: 1.90 cm RV Mid diam:    3.00 cm RV S prime:     12.90 cm/s TAPSE (M-mode): 2.5 cm LEFT ATRIUM             Index        RIGHT ATRIUM           Index LA diam:        3.60 cm 1.80 cm/m   RA Area:     15.80 cm LA Vol (A2C):   66.1 ml 33.01 ml/m  RA Volume:   39.50 ml  19.73 ml/m LA Vol (A4C):   60.4 ml 30.16 ml/m LA Biplane Vol: 66.0  ml 32.96 ml/m  AORTIC VALVE LVOT Vmax:   140.00 cm/s LVOT Vmean:  92.800 cm/s LVOT VTI:    0.289 m  AORTA Ao Root diam: 3.20 cm  Ao Asc diam:  2.90 cm MITRAL VALVE                TRICUSPID VALVE MV Area (PHT): 1.67 cm     TR Peak grad:   25.8 mmHg MV Area VTI:   2.99 cm     TR Vmax:        254.00 cm/s MV Peak grad:  7.7 mmHg MV Mean grad:  4.0 mmHg     SHUNTS MV Vmax:       1.39 m/s     Systemic VTI:  0.29 m MV Vmean:      90.9 cm/s    Systemic Diam: 2.00 cm MV Decel Time: 453 msec MV E velocity: 80.30 cm/s MV A velocity: 122.00 cm/s MV E/A ratio:  0.66 Mithra Land signed by Carolan Clines Signature Date/Time: 05/21/2023/4:04:38 PM    Final     MR BRAIN WO CONTRAST   Result Date: 05/21/2023 CLINICAL DATA:  67 year old female code stroke presentation on 05/20/2023 with small right brainstem hemorrhage. EXAM: MRI HEAD WITHOUT CONTRAST TECHNIQUE: Multiplanar, multiecho pulse sequences of the brain and surrounding structures were obtained without intravenous contrast. COMPARISON:  Head CTs 05/20/2023 and earlier. Brain MRI 12/01/2022 and earlier. FINDINGS: Brain: Chronic very severe small-vessel disease signal changes in the deep white matter, deep gray matter, brainstem, cerebellum. Scattered chronic microhemorrhages and chronic transependymal hemosiderin bilaterally. Extensive brainstem chronic hemorrhages on SWI. Deep cerebellar chronic microhemorrhages also. The above findings are chronic but mildly progressed since the June MRI. Superimposed small acute dorsal right brainstem hemorrhage is most apparent as DWI artifact (series 2, image 16), hypointense on T1 and 4-5 mm (series 8, image 14). No significant posterior fossa mass effect. Superimposed punctate posterosuperior left frontal lobe restricted diffusion in the subcortical white matter of the perirolandic area series 2, image 38. No other convincing restricted diffusion. No superimposed midline shift, evidence of mass lesion, ventriculomegaly.  Cervicomedullary junction and pituitary are within normal limits. Vascular: Major intracranial vascular flow voids are preserved. Skull and upper cervical spine: Stable, negative. Sinuses/Orbits: Stable, negative. Other: Mastoids are well aerated. Grossly normal visible internal auditory structures. Negative visible scalp and face. IMPRESSION: 1. Punctate posterior Left MCA territory subcortical white matter infarct. No mass or hemorrhage there. 2. Subcentimeter acute right dorsal brainstem hemorrhage is stable since yesterday. No mass effect. 3. Underlying very severe underlying chronic small vessel disease including extensive chronic microhemorrhages and small vessel infarcts throughout the brain - mildly progressed from a June MRI. Electronically Signed   By: Odessa Fleming M.D.   On: 05/21/2023 09:36    CT HEAD WO CONTRAST ( )   Result Date: 05/20/2023 CLINICAL DATA:  Hemorrhage follow-up EXAM: CT HEAD WITHOUT CONTRAST TECHNIQUE: Contiguous axial images were obtained from the base of the skull through the vertex without intravenous contrast. RADIATION DOSE REDUCTION: This exam was performed according to the departmental dose-optimization program which includes automated exposure control, adjustment of the mA and/or kV according to patient size and/or use of iterative reconstruction technique. COMPARISON:  05/20/2023 at 3:48 p.m. FINDINGS: Brain: Unchanged appearance of small intraparenchymal hematoma at the right middle cerebellar peduncle. Chronic white matter hypoattenuation. Old left cerebellar infarct. No extra-axial collection. No mass effect or hydrocephalus. Vascular: There is atherosclerotic calcification of both internal carotid arteries at the skull base. Skull: Normal Sinuses/Orbits: Negative Other: None IMPRESSION: Unchanged appearance of small intraparenchymal hematoma at the right middle cerebellar peduncle. Electronically Signed   By: Deatra Robinson M.D.   On: 05/20/2023  21:07    DG CHEST PORT 1  VIEW   Result Date: 05/20/2023 CLINICAL DATA:  Hypoxia, nausea and vomiting EXAM: PORTABLE CHEST 1 VIEW COMPARISON:  01/23/2022 FINDINGS: Single frontal view of the chest demonstrates an unremarkable cardiac silhouette. No airspace disease, effusion, or pneumothorax. No acute bony abnormalities. IMPRESSION: 1. No acute intrathoracic process. Electronically Signed   By: Sharlet Salina M.D.   On: 05/20/2023 17:25    CT HEAD WO CONTRAST ( )   Result Date: 05/20/2023 CLINICAL DATA:  Neuro deficit, acute, stroke suspected EXAM: CT HEAD WITHOUT CONTRAST TECHNIQUE: Contiguous axial images were obtained from the base of the skull through the vertex without intravenous contrast. RADIATION DOSE REDUCTION: This exam was performed according to the departmental dose-optimization program which includes automated exposure control, adjustment of the mA and/or kV according to patient size and/or use of iterative reconstruction technique. COMPARISON:  Same day CT head FINDINGS: Brain: There is a 7 x 8 mm site acute hemorrhage in the right dorsal pons/brachium pontis (series 2, image 8). Sequela of mild chronic microvascular ischemic change. Chronic infarcts in the right basal ganglia and left cerebellum. No significant mass effect. No mass lesion. Vascular: No hyperdense vessel or unexpected calcification. Skull: Normal. Negative for fracture or focal lesion. Sinuses/Orbits: No middle ear or mastoid effusion. Paranasal sinuses are clear. Bilateral lens. Orbits are otherwise unremarkable. Other: None. IMPRESSION: Acute hemorrhage in the right dorsal pons/brachium pontis measuring 7 x 8 mm. No significant mass effect. Findings were paged to Dr. Selina Cooley on 05/20/23 at 4:21 PM. Electronically Signed   By: Lorenza Cambridge M.D.   On: 05/20/2023 16:21    CT ANGIO HEAD NECK W WO CM (CODE STROKE)   Result Date: 05/20/2023 CLINICAL DATA:  67 year old female code stroke presentation with left side weakness, facial droop, aphasia. EXAM:  CT ANGIOGRAPHY HEAD AND NECK TECHNIQUE: Multidetector CT imaging of the head and neck was performed using the standard protocol during bolus administration of intravenous contrast. Multiplanar CT image reconstructions and MIPs were obtained to evaluate the vascular anatomy. Carotid stenosis measurements (when applicable) are obtained utilizing NASCET criteria, using the distal internal carotid diameter as the denominator. RADIATION DOSE REDUCTION: This exam was performed according to the departmental dose-optimization program which includes automated exposure control, adjustment of the mA and/or kV according to patient size and/or use of iterative reconstruction technique. CONTRAST:  75mL OMNIPAQUE IOHEXOL 350 MG/ML SOLN COMPARISON:  Plain head CT 1223 hours today. Prior CTA head and neck 10/08/2018. FINDINGS: CTA NECK Skeleton: Largely absent dentition. Mild for age cervical spine degeneration. No acute osseous abnormality identified. Upper chest: Negative. Other neck: Small volume retained secretions in the hypopharynx. Otherwise negative. Aortic arch: 3 vessel arch. Chronic Calcified aortic atherosclerosis. Right carotid system: Brachiocephalic artery plaque without stenosis. Negative right CCA. Mild calcified plaque at the right carotid bifurcation more affecting the ECA. No stenosis. Left carotid system: Mild left CCA origin calcified plaque without stenosis. Mild calcified plaque at the left carotid bifurcation. Mild tortuosity. No stenosis. Vertebral arteries: Mild right subclavian origin calcified plaque without stenosis. Minimal plaque at the right vertebral artery origin without stenosis. Tortuous right V1 segment. Right vertebral patent to the skull base without stenosis. Proximal left subclavian artery soft and calcified plaque without stenosis. Left vertebral artery origin remains normal. Tortuous left V1 segment. Codominant left vertebral artery is mildly tortuous to the skull base with no plaque or  stenosis. CTA HEAD Posterior circulation: Distal vertebral arteries, vertebrobasilar junction and PICA origins appear patent  and normal. No basilar artery stenosis. Patent AICA, SCA, PCA origins. Bilateral PCA branches remain patent. Left P2 segment moderate irregularity and stenosis on series 12, image 22 appears increased. Chronic right P2 segment irregularity and stenosis is mild to moderate and stable. Anterior circulation: Both ICA siphons are patent but heavily calcified. On the left moderate cavernous and anterior genu stenosis appears progressed since 2020. On the right moderate to severe cavernous segment (series 6, image 246) and anterior genu stenosis also appears progressed. Patent carotid termini. Normal MCA and ACA origins. Normal anterior communicating artery. Small median artery of the corpus callosum. Bilateral ACA branches are stable and within normal limits. Left MCA M1 segment and bifurcation are patent without stenosis. Right MCA M1 segment and bifurcation are patent without stenosis. Bilateral MCA branches appear stable since 2020. Mild if any branch irregularity. Venous sinuses: Patent. Anatomic variants: None significant. Review of the MIP images confirms the above findings IMPRESSION: 1. Negative for large vessel occlusion. Preliminary report of this finding communicated to Dr. Bing Neighbors at 1247 hours on 05/20/2023 by text page via the [facility] messaging system. 2. Positive for chronic ICA siphon calcified plaque where stenosis appears progressed from a 2020 CTA, up to Moderate on the Left and Severe on the Right. Moderate Left PCA P2 segment stenosis also appears increased. Mild for age extracranial atherosclerosis without stenosis. 3.  Aortic Atherosclerosis (ICD10-I70.0). Electronically Signed   By: Odessa Fleming M.D.   On: 05/20/2023 12:55    CT HEAD CODE STROKE WO CONTRAST   Result Date: 05/20/2023 CLINICAL DATA:  Code stroke.  Neuro deficit, acute, stroke suspected EXAM: CT HEAD WITHOUT  CONTRAST TECHNIQUE: Contiguous axial images were obtained from the base of the skull through the vertex without intravenous contrast. RADIATION DOSE REDUCTION: This exam was performed according to the departmental dose-optimization program which includes automated exposure control, adjustment of the mA and/or kV according to patient size and/or use of iterative reconstruction technique. COMPARISON:  Brain MR 07/02/22 FINDINGS: Brain: No hemorrhage. No hydrocephalus. No extra-axial fluid collection. There is sequela of severe chronic microvascular ischemic change. Chronic infarcts in the right basal ganglia and left cerebellum. Vascular: No hyperdense vessel or unexpected calcification. Skull: Normal. Negative for fracture or focal lesion. Sinuses/Orbits: No middle ear or mastoid effusion. Paranasal sinuses are clear. Bilateral lens replacement. Orbits are otherwise unremarkable. Other: None. ASPECTS ([place] Stroke Program Early CT Score): 10 IMPRESSION: 1. No hemorrhage or CT evidence of an acute cortical infarct. Aspects is 10. 2. Sequela of severe chronic microvascular ischemic change. Chronic infarcts in the right basal ganglia and left cerebellum. Findings were discussed with Dr. Deretha Emory on 05/20/23 at 12:38 PM Electronically Signed   By: Lorenza Cambridge M.D.   On: 05/20/2023 12:39         HISTORY OF PRESENT ILLNESS 67 y.o. patient with history of CAD, hyperlipidemia, hypertension, hypothyroidism, ESRD on dialysis, stroke and type 2 diabetes was admitted with expressive aphasia and worsening left-sided weakness.   HOSPITAL COURSE Patient originally presented to [facility] and was given TNK there.  She transferred here and had worsening of her exam, head CT was performed demonstrating hemorrhagic transformation of right pontine infarct.  TNK was reversed with TXA and cryoprecipitate.  She did have a hematoma at her right upper arm dialysis fistula, but this has greatly improved.  Hemorrhagic transformation  was noted to be stable on later CT and MRI.  Patient is now ready to return home with family support and home health  physical therapy, Occupational Therapy and speech therapy.    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 Cardioembolic source Code Stroke CT head No acute abnormality.  ASPECTS 10.    CTA head & neck: No LVO, right ICA siphon severe stenosis, left ICA siphon moderate and left P2 moderate stenosis. Repeat CTH on admission to [facility] ICU with neuro change: Acute hemorrhage in the right dorsal pons/brachium pontis measuring 7 x 8 mm. No significant mass effect Repeat CTH stability scan: Unchanged appearance of small intraparenchymal hematoma right middle cerebellar peduncle MRI  Punctate posterior left MCA territory subcortical white matter infarct. Acute right dorsal brainstem hemorrhage stable. Underlying very severe chronic small vessel disease including extensive chronic microhemorrhages and small vessel infarcts throughout brain 2D Echo: LVEF 60 to 65%  VAS [place] LE DVT: Negative Outpatient 30-day cardiac monitoring ordered LDL 102 HgbA1c 6.2 VTE prophylaxis - heparin subq aspirin 81 mg daily prior to admission, now on ASA 81 on discharge.  Therapy recommendations:  CIR, however, family prefer HH Disposition: Home with home health PT, OT and SLP      Status of chronic problems ypertension Hypotension with dialysis Home meds: Amlodipine 10 mg, carvedilol 12.5 mg Lasix 80 mg Per daughter, patient's blood pressure meds were recently lowered and she was started on midodrine before dialysis due to hypotension during dialysis treatments MRI showed extensive chronic microhemorrhages, more at brainstem, indicating uncontrolled HTN in the past Blood Pressure Goal: SBP less than 160  Long-term BP goal normotensive   Hyperlipidemia Home meds: Lipitor 80 mg LDL 102, goal < 70 Resumed Lipitor  80 Continue statin on discharge   Diabetes type II Controlled Home meds: Humalog insulin HgbA1c 6.2, goal < 7.0 CBGs SSI Recommend close follow-up with PCP    Dysphagia Patient has post-stroke dysphagia SLP consulted Now on dysphagia 1 and thin liquid Advance diet as tolerated   Other Stroke Risk Factors Obesity, Body mass index is 29.66 kg/m., BMI >/= 30 associated with increased stroke risk, recommend weight loss, diet and exercise as appropriate  Family hx stroke (sister) Coronary artery disease Former smoker   Other Active Problems Dementia on Namenda, resumed home Namenda ESRD on hemodialysis Receives hemodialysis Tuesday, Wednesday, Saturday, Sunday at home Had HD 12/9   Lab Results  Component Value Date   WBC 8.9 05/24/2023   HGB 8.8 (L) 05/24/2023   HCT 27.8 (L) 05/24/2023   MCV 88.3 05/24/2023   PLT 198 05/24/2023   Lab Results  Component Value Date   NA 137 05/24/2023   K 4.1 05/24/2023   CO2 28 05/24/2023   GLUCOSE 115 (H) 05/24/2023   BUN 54 (H) 05/24/2023   CREATININE 5.92 (H) 05/24/2023   CALCIUM 8.1 (L) 05/24/2023   GFR 10.95 (LL) 11/19/2021   GFRNONAA 7 (L) 05/24/2023  Sees nephrology On dialysis (home)  Does jan first   Lab Results  Component Value Date   ALT 19 05/20/2023   AST 21 05/20/2023   ALKPHOS 53 05/20/2023   BILITOT 0.6 05/20/2023    Lab Results  Component Value Date   INR 1.1 05/20/2023   INR 0.99 11/14/2017   INR 1.68 (H) 11/04/2015   Lab Results  Component Value Date   CHOL 163 05/22/2023   HDL 37 (L) 05/22/2023   LDLCALC 102 (H) 05/22/2023   LDLDIRECT 154.2 11/04/2011   TRIG 119 05/22/2023   CHOLHDL 4.4 05/22/2023   Lab Results  Component  Value Date   HGBA1C 6.2 (H) 05/20/2023  Sees endocrinology  Planned to continue asa 81 mg  Follow up cardiology after completion of 30 day cardiac monitoring   (has appointment 12/19)   History of CHF   Also copd   Follow up [facility] stroke clinic in 4 weeks (has  appointment 12/24)    Sleeps a lot  Urine stinks  Cannot tell family when she has to urinate  Is home  [facility] - PT and speech and OT , just getting started  All meds are crushed or liquid  (Sometimes holds food in mouth)   Refuses to urinate on toilet Only goes in her garment     Pureed food overall  Closing right eye a little better (still dry) Face droop is improved also   Patient Active Problem List   Diagnosis Date Noted   Abnormal urine odor 05/30/2023   Stroke determined by clinical assessment (HCC) 05/20/2023   Anemia of chronic renal failure 12/19/2022   (HFpEF) heart failure with preserved ejection fraction (HCC) 11/25/2022   Auditory hallucination 07/26/2022   Carotid artery stenosis, asymptomatic, left 05/04/2022   Anemia of chronic disease 03/23/2022   Hyponatremia 03/23/2022   Hyperkalemia 03/23/2022   Dehydration 03/23/2022   Chronic diastolic CHF (congestive heart failure) (HCC) 03/23/2022   SBO (small bowel obstruction) (HCC) 03/22/2022   Closed left ankle fracture 01/26/2022   Type 2 diabetes mellitus with hyperglycemia (HCC) 01/07/2022   Acute kidney injury superimposed on chronic kidney disease (HCC) 11/19/2021   Frequent urination 07/13/2021   Change in mental status 07/13/2021   Gait abnormality 07/13/2021   Memory loss 07/13/2021   Abnormal urinalysis 07/13/2021   ESRD (end stage renal disease) (HCC) 07/13/2021   Normocytic anemia 07/13/2021   Diabetic nephropathy (HCC) 05/28/2020   Diabetic retinopathy (HCC) 05/28/2020   Presence of insulin pump (external) (internal) 05/28/2020   Diabetic macular edema of right eye with proliferative retinopathy associated with type 2 diabetes mellitus (HCC) 04/16/2020   Macular pucker, right eye 04/16/2020   Vitreous hemorrhage of left eye (HCC) 04/16/2020   Pre-op examination 03/13/2020   Chronic venous insufficiency 02/06/2020   Constipation 05/28/2019   S/P TKR (total knee replacement), right 03/23/2019  05/07/2019   Primary osteoarthritis of right knee 04/02/2019   Unilateral primary osteoarthritis, right knee    Pedal edema 02/25/2019   Acute vertigo with vomiting and inability to stand 10/08/2018   Uncontrolled type 2 diabetes mellitus with hyperglycemia, with long-term current use of insulin (HCC) 10/08/2018   Former smoker 10/08/2018   Carotid artery disease (HCC) 03/15/2018   History of CVA (cerebrovascular accident) 11/16/2017   Low back pain 10/26/2017   Screening mammogram, encounter for 09/18/2017   Coronary artery disease involving native coronary artery of native heart without angina pectoris 11/04/2015   NSTEMI (non-ST elevated myocardial infarction) (HCC) 11/03/2015   Leukocytosis 08/16/2010   Acquired hypothyroidism 07/06/2010   HLD (hyperlipidemia) 08/28/2008   Essential hypertension 11/13/2007   Proliferative diabetic retinopathy of left eye with macular edema associated with type 2 diabetes mellitus (HCC) 09/21/2006   Diabetic peripheral neuropathy (HCC) 09/21/2006   Obesity (BMI 30-39.9) 09/21/2006   Adjustment disorder with mixed anxiety and depressed mood 09/21/2006   CARPAL TUNNEL SYNDROME 09/21/2006   COPD (chronic obstructive pulmonary disease) (HCC) 09/21/2006   EDEMA 09/21/2006   MIGRAINES, HX OF 09/21/2006   Past Medical History:  Diagnosis Date   CAD (coronary artery disease)    cath 5/23 100% dist RCA lesion  treated with 2 overlapping Integrity Resolute DES ( 2.25x54mm, 2.25x62mm), 60% mid RCA treated medically, 99% OM2 not amenable to PCI, 70% D1 lesion, EF normal   Hypercholesteremia    Hypertension    Hypothyroidism    Kidney disease    Renal disorder    Stroke (HCC) 10/2018   Thyroid disease    Type 2 diabetes mellitus (HCC) 10/08/2018   Past Surgical History:  Procedure Laterality Date   ABDOMINAL HYSTERECTOMY     AV FISTULA PLACEMENT Right 06/28/2022   Procedure: RIGHT ARM ARTERIOVENOUS (AV) FISTULA CREATION;  Surgeon: Larina Earthly, MD;   Location: [facility];  Service: Vascular;  Laterality: Right;   CARDIAC CATHETERIZATION N/A 11/03/2015   Procedure: Left Heart Cath and Coronary Angiography;  Surgeon: Marykay Lex, MD;  Location: [facility] INVASIVE CV LAB;  Service: Cardiovascular;  Laterality: N/A;   CARDIAC CATHETERIZATION N/A 11/03/2015   Procedure: Coronary Stent Intervention;  Surgeon: Marykay Lex, MD;  Location: [facility] INVASIVE CV LAB;  Service: Cardiovascular;  Laterality: N/A;   CARPAL TUNNEL RELEASE     CORONARY ANGIOPLASTY     1 stent   FISTULA SUPERFICIALIZATION Right 08/16/2022   Procedure: RIGHT ARTERIOVENOUS FISTULA SUPERFICIALIZATION;  Surgeon: Larina Earthly, MD;  Location: [facility];  Service: Vascular;  Laterality: Right;   TOTAL KNEE ARTHROPLASTY Right 04/02/2019   Procedure: TOTAL KNEE ARTHROPLASTY;  Surgeon: Vickki Hearing, MD;  Location: [facility];  Service: Orthopedics;  Laterality: Right;   Social History   Tobacco Use   Smoking status: Former    Current packs/day: 0.00    Types: Cigarettes    Quit date: 05/13/2021    Years since quitting: 2.0   Smokeless tobacco: Never  Vaping Use   Vaping status: Never Used  Substance Use Topics   Alcohol use: No    Alcohol/week: 0.0 standard drinks of alcohol   Drug use: No   Family History  Problem Relation Age of Onset   Heart disease Mother    Stroke Sister    Heart attack Brother    Breast cancer Neg Hx    Allergies  Allergen Reactions   Tenecteplase Other (See Comments)   Metformin Hcl Other (See Comments)    Unknown reaction    Current Outpatient Medications on File Prior to Visit  Medication Sig Dispense Refill   aspirin 81 MG chewable tablet Chew 1 tablet (81 mg total) by mouth daily. 30 tablet 2   atorvastatin (LIPITOR) 80 MG tablet TAKE 1 TABLET BY MOUTH ONCE DAILY 6 IN THE EVENING 30 tablet 6   BD PEN NEEDLE NANO 2ND GEN 32G X 4 MM MISC Inject into the skin 3 (three) times daily.     buPROPion (WELLBUTRIN SR) 150 MG 12 hr tablet Take 1 tablet by  mouth twice daily 180 tablet 2   carvedilol (COREG) 12.5 MG tablet Take 1 tablet (12.5 mg total) by mouth daily. On non-dialysis days (Patient taking differently: Take 12.5 mg by mouth at bedtime. On non-dialysis days) 30 tablet 1   DULoxetine (CYMBALTA) 60 MG capsule Take 1 capsule by mouth once daily 90 capsule 2   ferrous sulfate 325 (65 FE) MG tablet Take 325 mg by mouth as needed (given when needed at dialysis).     folic acid (FOLVITE) 1 MG tablet Take 1 tablet by mouth once daily 30 tablet 0   furosemide (LASIX) 80 MG tablet Take 80 mg by mouth daily. On non-dialysis days     insulin lispro protamine-lispro (  HUMALOG 75/25 MIX) (75-25) 100 UNIT/ML SUSP injection Inject 10-25 Units into the skin See admin instructions. Inject 25 units with breakfast, 10 units at lunch, 25 units at dinner     levothyroxine (SYNTHROID) 175 MCG tablet 1 tablet in the morning on an empty stomach Orally Once a day     memantine (NAMENDA) 10 MG tablet Take 0.5 tablets (5 mg total) by mouth 2 (two) times daily. 90 tablet 1   midodrine (PROAMATINE) 10 MG tablet Take 10 mg by mouth daily. 30 min-1 hr prior to HD on hemodialysis days     nitroGLYCERIN (NITROSTAT) 0.4 MG SL tablet Place 0.4 mg under the tongue every 5 (five) minutes as needed for chest pain.     No current facility-administered medications on file prior to visit.    Review of Systems  Constitutional:  Positive for activity change, appetite change and fatigue. Negative for fever and unexpected weight change.  HENT:  Negative for congestion, ear pain, rhinorrhea, sinus pressure and sore throat.   Eyes:  Negative for pain, redness and visual disturbance.  Respiratory:  Negative for cough, shortness of breath and wheezing.   Cardiovascular:  Negative for chest pain and palpitations.  Gastrointestinal:  Negative for abdominal pain, blood in stool, constipation and diarrhea.  Endocrine: Negative for polydipsia and polyuria.  Genitourinary:  Negative for  dysuria, frequency and urgency.       Urinary incontinence   Musculoskeletal:  Negative for arthralgias, back pain and myalgias.  Skin:  Negative for pallor and rash.  Allergic/Immunologic: Negative for environmental allergies.  Neurological:  Positive for speech difficulty, weakness and numbness. Negative for dizziness, seizures, syncope and headaches.  Hematological:  Negative for adenopathy. Does not bruise/bleed easily.  Psychiatric/Behavioral:  Positive for confusion. Negative for decreased concentration and dysphoric mood. The patient is not nervous/anxious.        Dementia        Objective:   Physical Exam Constitutional:      General: She is not in acute distress.    Appearance: She is well-developed. She is obese. She is not ill-appearing or diaphoretic.     Comments: Frail appearing  Wheelchair bound  Sleeps much of visit  Head lags to right  Some drooling   HENT:     Head: Normocephalic and atraumatic.     Mouth/Throat:     Mouth: Mucous membranes are moist.  Eyes:     General: No scleral icterus.       Right eye: No discharge.        Left eye: No discharge.     Conjunctiva/sclera: Conjunctivae normal.     Pupils: Pupils are equal, round, and reactive to light.     Comments: Unable to fully close right eye    Neck:     Thyroid: No thyromegaly.     Vascular: No carotid bruit or JVD.  Cardiovascular:     Rate and Rhythm: Normal rate and regular rhythm.     Heart sounds: Normal heart sounds.     No gallop.  Pulmonary:     Effort: Pulmonary effort is normal. No respiratory distress.     Breath sounds: Normal breath sounds. No stridor. No wheezing, rhonchi or rales.     Comments: Few dry crackles in bases  Abdominal:     General: There is no distension or abdominal bruit.     Palpations: Abdomen is soft.     Tenderness: There is no abdominal tenderness. There is no guarding or  rebound.  Musculoskeletal:     Cervical back: Normal range of motion and neck supple.      Right lower leg: No edema.     Left lower leg: No edema.     Comments: Trace pedal edema if any  Lymphadenopathy:     Cervical: No cervical adenopathy.  Skin:    General: Skin is warm and dry.     Coloration: Skin is not pale.     Findings: No rash.  Neurological:     Mental Status: She is alert.     Cranial Nerves: Cranial nerve deficit present.     Motor: Weakness present.     Coordination: Coordination normal.     Deep Tendon Reflexes: Reflexes are normal and symmetric. Reflexes normal.     Comments: Right facial droop Drooling  Unable to fully close right eye   Psychiatric:        Attention and Perception: She is inattentive.        Speech: She is noncommunicative.        Behavior: Behavior is slowed.        Cognition and Memory: Cognition is impaired. Memory is impaired.           Assessment & Plan:   Problem List Items Addressed This Visit       Cardiovascular and Mediastinum   Essential hypertension (Chronic)   Taking minodrine before dialysis due to hypotension  BP: 122/64   Overall stable  Continues carvedilol 12.5 mg daily at bedtime from cardiology       (HFpEF) heart failure with preserved ejection fraction (HCC) (Chronic)   Continues carvedilol 12.5 mg daily  Cardiology care       Chronic diastolic CHF (congestive heart failure) (HCC)   No recent changes Pulse ox goes down during dialysis per family  For cardiology follow up soon         Respiratory   COPD (chronic obstructive pulmonary disease) (HCC)   Pulse ox fluctuates  No wheezing or shortness of breath  No medications currently         Endocrine   Type 2 diabetes mellitus with hyperglycemia (HCC)   Sees endocrinology  Lab Results  Component Value Date   HGBA1C 6.2 (H) 05/20/2023   With ESRD on dialysis         Genitourinary   ESRD (end stage renal disease) [facility])   Nephrology care Home dialysis         Other   HLD (hyperlipidemia) (Chronic)   Resumed statin on  hosp d/c Disc goals for lipids and reasons to control them Rev last labs with pt Rev low sat fat diet in detail       Normocytic anemia   Likely due to ESRD Family to discuss with nephrology  Does get iron  ? If would benefit from addn therapy  Last hb 8.8  Family checks labs with dialysis and willsend copy of latest       History of CVA (cerebrovascular accident) - Primary   Recently hospitalized Reviewed hospital records, lab results and studies in detail   Acute ischemic infarct that turned hemorrhagic after TNK -then reversed with TXA and cryo Large vessel dz vs cardioemb source Pending heart monitor from cardiology  With card and neuro follow up this month  Taking asa 81 mg daily  HH PT and OT and SLP currently  Unable to fully close right eye (however improving)- re prescribed her eye lubrication oint and drops  Offered  home nursing ref if needed Offered palliative ref if needed in light of numerous severe health problems and reduced quality of life (ESRF and dementia)  Family is considering this and will get back to use if interested  Daughters are able to care for pt in home and do her dialysis       Abnormal urine odor   Pt is totally incontinent and unable due to dementia to urinate in toilet or for collection  We do not have ability to cath Family would rather not refer to urology  Encouraged fluids Will treatment empirically for uti with keflex 500 mg (after dialysis for 5 doses) Update if not starting to improve in a week or if worsening

## 2023-05-30 NOTE — Assessment & Plan Note (Signed)
No recent changes Pulse ox goes down during dialysis per family  For cardiology follow up soon

## 2023-05-30 NOTE — Assessment & Plan Note (Signed)
Continues carvedilol 12.5 mg daily  Cardiology care

## 2023-05-30 NOTE — Assessment & Plan Note (Signed)
Taking minodrine before dialysis due to hypotension  BP: 122/64   Overall stable  Continues carvedilol 12.5 mg daily at bedtime from cardiology

## 2023-05-30 NOTE — Assessment & Plan Note (Signed)
Likely due to ESRD Family to discuss with nephrology  Does get iron  ? If would benefit from addn therapy  Last hb 8.8  Family checks labs with dialysis and willsend copy of latest

## 2023-05-30 NOTE — Assessment & Plan Note (Signed)
Nephrology care Home dialysis

## 2023-05-30 NOTE — Patient Instructions (Addendum)
Touch base with your nephrologist about the anemia   Let cardiology know you have not received the monitor yet   Read about palliative care and let us know if you are interested   If you want a referral for home health nursing let me know   Follow up with cardiology and neurology as planned   Download labs into my chart if you can   Get the eye ointment if you can- I printed the prescription   Keep watching pulse ox and let me know if it stays low   Give keflex for potential uti after dialysis for 5 doses

## 2023-05-30 NOTE — Assessment & Plan Note (Addendum)
Recently hospitalized Reviewed hospital records, lab results and studies in detail   Acute ischemic infarct that turned hemorrhagic after TNK -then reversed with TXA and cryo Large vessel dz vs cardioemb source Pending heart monitor from cardiology  With card and neuro follow up this month  Taking asa 81 mg daily  HH PT and OT and SLP currently  Unable to fully close right eye (however improving)- re prescribed her eye lubrication oint and drops  Offered home nursing ref if needed Offered palliative ref if needed in light of numerous severe health problems and reduced quality of life (ESRF and dementia)  Family is considering this and will get back to use if interested  Daughters are able to care for pt in home and do her dialysis

## 2023-05-31 ENCOUNTER — Telehealth: Payer: Self-pay

## 2023-05-31 NOTE — Telephone Encounter (Signed)
Please ok that verbal order  

## 2023-05-31 NOTE — Telephone Encounter (Signed)
VO given to [place]

## 2023-05-31 NOTE — Telephone Encounter (Signed)
Copied from CRM [id number]. Topic: Clinical - Home Health Verbal Orders >> May 31, 2023  4:05 PM Fuller Mandril wrote: Caller/Agency: Celedonio Miyamoto Speech Therapist with [facility] Number: [phone] - confidential VM can accept verbal order  Service Requested: Speech Therapy Frequency: 1 week / 8  Any new concerns about the patient? N/A

## 2023-06-01 ENCOUNTER — Ambulatory Visit: Payer: Medicare Other | Attending: Internal Medicine | Admitting: Internal Medicine

## 2023-06-01 ENCOUNTER — Encounter: Payer: Self-pay | Admitting: Internal Medicine

## 2023-06-01 ENCOUNTER — Telehealth: Payer: Self-pay | Admitting: Neurology

## 2023-06-01 VITALS — BP 142/70 | HR 83 | Ht 67.0 in

## 2023-06-01 DIAGNOSIS — I639 Cerebral infarction, unspecified: Secondary | ICD-10-CM | POA: Insufficient documentation

## 2023-06-01 NOTE — Patient Instructions (Signed)
Medication Instructions:  Your physician recommends that you continue on your current medications as directed. Please refer to the Current Medication list given to you today.  *If you need a refill on your cardiac medications before your next appointment, please call your pharmacy*   Lab Work: None If you have labs (blood work) drawn today and your tests are completely normal, you will receive your results only by: MyChart Message (if you have MyChart) OR A paper copy in the mail If you have any lab test that is abnormal or we need to change your treatment, we will call you to review the results.   Testing/Procedures: Carotid Duplex   Follow-Up: At [facility], you and your health needs are our priority.  As part of our continuing mission to provide you with exceptional heart care, we have created designated Provider Care Teams.  These Care Teams include your primary Cardiologist (physician) and Advanced Practice Providers (APPs -  Physician Assistants and Nurse Practitioners) who all work together to provide you with the care you need, when you need it.  We recommend signing up for the patient portal called "MyChart".  Sign up information is provided on this After Visit Summary.  MyChart is used to connect with patients for Virtual Visits (Telemedicine).  Patients are able to view lab/test results, encounter notes, upcoming appointments, etc.  Non-urgent messages can be sent to your provider as well.   To learn more about what you can do with MyChart, go to [email or web address].    Your next appointment:   6 month(s)  Provider:   You may see Vishnu P Mallipeddi, MD or one of the following Advanced Practice Providers on your designated Care Team:   [place], PA-C  Jacolyn Reedy, [place]     Other Instructions

## 2023-06-01 NOTE — Telephone Encounter (Signed)
Pt's daughter canceling appt due to work conflict being she is the pt's transportation. Asking if pt can be worked into the schedule due to pt's condition. Requesting call back

## 2023-06-01 NOTE — Progress Notes (Signed)
Cardiology Office Note  Date: 06/01/2023   ID: Jennifer [name], DOB February 22, 1956, MRN [id number]  PCP:  [name], MD  Cardiologist:  Marjo Bicker, MD Electrophysiologist:  None    History of Present Illness: Jennifer [name] is a 67 y.o. female known to have CAD manifested by NSTEMI in 2017 s/p distal RCA PCI with residual mid RCA 60%, OM2 99% (not amenable to PCI) and D1 70% stenosis with normal LVEF, acute ischemic CVA in the right pons s/p TNK with hemorrhagic transformation s/p reversal with TXA and cryo (large vessel disease versus cardioembolic source per neurology), history of prior CVA in 2019 and 2020, HTN, HLD, DM 2, thyroid disease presented to cardiology clinic for follow-up visit. Accompanied by daughter.  Patient recently had acute ischemic CVA in the right pons in December 2024, administered TNK but complicated by hemorrhagic transformation which had to be reversed by TXA and cryo.  Etiology large vessel disease versus cardioembolic source per neurology.  She had prior CVA in the past, in 2019 and 2020 no atrial arrhythmias noted during the hospitalization.  Echocardiogram showed hypermobile septum and recommendation was to obtain limited echocardiogram.  I reviewed all echocardiograms and it did show interatrial communication/PFO in 2020.  Prior to stroke, she is physically active.  After her stroke, she is mostly wheelchair-bound and walks with a walker a few steps.  She continues to have residual hemiparesis.  Neurology appointment is coming up.  30-day event monitor is shipped to her residence (when she was hospitalized).  Currently She is undergoing hemodialysis at home.  She has 3 daughters who are helping her.  No symptoms overall, no angina, DOE, orthopnea, PND, dizziness, syncope, leg swelling.   Past Medical History:  Diagnosis Date   CAD (coronary artery disease)    cath 5/23 100% dist RCA lesion treated with 2 overlapping Integrity Resolute DES ( 2.25x82mm,  2.25x50mm), 60% mid RCA treated medically, 99% OM2 not amenable to PCI, 70% D1 lesion, EF normal   Hypercholesteremia    Hypertension    Hypothyroidism    Kidney disease    Renal disorder    Stroke (HCC) 10/2018   Thyroid disease    Type 2 diabetes mellitus (HCC) 10/08/2018    Past Surgical History:  Procedure Laterality Date   ABDOMINAL HYSTERECTOMY     AV FISTULA PLACEMENT Right 06/28/2022   Procedure: RIGHT ARM ARTERIOVENOUS (AV) FISTULA CREATION;  Surgeon: Larina Earthly, MD;  Location: [facility];  Service: Vascular;  Laterality: Right;   CARDIAC CATHETERIZATION N/A 11/03/2015   Procedure: Left Heart Cath and Coronary Angiography;  Surgeon: Marykay Lex, MD;  Location: [facility] INVASIVE CV LAB;  Service: Cardiovascular;  Laterality: N/A;   CARDIAC CATHETERIZATION N/A 11/03/2015   Procedure: Coronary Stent Intervention;  Surgeon: Marykay Lex, MD;  [facility] INVASIVE CV LAB;  Service: Cardiovascular;  Laterality: N/A;   CARPAL TUNNEL RELEASE     CORONARY ANGIOPLASTY     1 stent   FISTULA SUPERFICIALIZATION Right 08/16/2022   Procedure: RIGHT ARTERIOVENOUS FISTULA SUPERFICIALIZATION;  Surgeon: Larina Earthly, MD;  Location: [facility];  Service: Vascular;  Laterality: Right;   TOTAL KNEE ARTHROPLASTY Right 04/02/2019   Procedure: TOTAL KNEE ARTHROPLASTY;  Surgeon: Vickki Hearing, MD;  Location: [facility];  Service: Orthopedics;  Laterality: Right;    Current Outpatient Medications  Medication Sig Dispense Refill   artificial tears (LACRILUBE) OINT ophthalmic ointment Place into both eyes at bedtime as needed for up  to 62 doses for dry eyes. 30 g 2   aspirin 81 MG chewable tablet Chew 1 tablet (81 mg total) by mouth daily. 30 tablet 2   atorvastatin (LIPITOR) 80 MG tablet TAKE 1 TABLET BY MOUTH ONCE DAILY 6 IN THE EVENING 30 tablet 6   BD PEN NEEDLE NANO 2ND GEN 32G X 4 MM MISC Inject into the skin 3 (three) times daily.     buPROPion (WELLBUTRIN SR) 150 MG 12 hr tablet Take 1 tablet by  mouth twice daily 180 tablet 2   carvedilol (COREG) 12.5 MG tablet Take 1 tablet (12.5 mg total) by mouth daily. On non-dialysis days (Patient taking differently: Take 12.5 mg by mouth at bedtime. On non-dialysis days) 30 tablet 1   cephALEXin (KEFLEX) 500 MG capsule Give 500 mg (open and sprinkle) after dialysis for 5 doses 5 capsule 0   DULoxetine (CYMBALTA) 60 MG capsule Take 1 capsule by mouth once daily 90 capsule 2   folic acid (FOLVITE) 1 MG tablet Take 1 tablet by mouth once daily 30 tablet 0   furosemide (LASIX) 80 MG tablet Take 80 mg by mouth daily. On non-dialysis days     insulin lispro protamine-lispro (HUMALOG 75/25 MIX) (75-25) 100 UNIT/ML SUSP injection Inject 10-25 Units into the skin See admin instructions. Inject 25 units with breakfast, 10 units at lunch, 25 units at dinner     levothyroxine (SYNTHROID) 175 MCG tablet 1 tablet in the morning on an empty stomach Orally Once a day     memantine (NAMENDA) 10 MG tablet Take 0.5 tablets (5 mg total) by mouth 2 (two) times daily. 90 tablet 1   nitroGLYCERIN (NITROSTAT) 0.4 MG SL tablet Place 0.4 mg under the tongue every 5 (five) minutes as needed for chest pain.     ferrous sulfate 325 (65 FE) MG tablet Take 325 mg by mouth as needed (given when needed at dialysis). (Patient not taking: Reported on 06/01/2023)     midodrine (PROAMATINE) 10 MG tablet Take 10 mg by mouth daily. 30 min-1 hr prior to HD on hemodialysis days (Patient not taking: Reported on 06/01/2023)     polyvinyl alcohol (LIQUIFILM TEARS) 1.4 % ophthalmic solution Place 1 drop into both eyes every 4 (four) hours. (Patient not taking: Reported on 06/01/2023) 15 mL 0   No current facility-administered medications for this visit.   Allergies:  Tenecteplase and Metformin hcl   Social History: The patient  reports that she quit smoking about 2 years ago. Her smoking use included cigarettes. She has never used smokeless tobacco. She reports that she does not drink alcohol  and does not use drugs.   Family History: The patient's family history includes Heart attack in her brother; Heart disease in her mother; Stroke in her sister.   ROS:  Please see the history of present illness. Otherwise, complete review of systems is positive for none.  All other systems are reviewed and negative.   Physical Exam: VS:  BP (!) 142/70 (BP Location: Left Arm)   Pulse 83   Ht 5\' 7"  (1.702 m)   SpO2 98%   BMI 30.35 kg/m , BMI Body mass index is 30.35 kg/m.  Wt Readings from Last 3 Encounters:  05/24/23 193 lb 12.6 oz (87.9 kg)  03/07/23 218 lb (98.9 kg)  11/25/22 226 lb (102.5 kg)    General: Patient appears comfortable at rest. HEENT: Conjunctiva and lids normal, oropharynx clear with moist mucosa. Neck: Supple, no elevated JVP or carotid bruits,  no thyromegaly. Lungs: Clear to auscultation, nonlabored breathing at rest. Cardiac: Regular rate and rhythm, no S3 or significant systolic murmur, no pericardial rub. Abdomen: Soft, nontender, no hepatomegaly, bowel sounds present, no guarding or rebound. Extremities: no edema lower extremities. Skin: Warm and dry. Musculoskeletal: No kyphosis. Neuropsychiatric: Alert and oriented x3, right-sided facial droop noted.  Recent Labwork: 11/22/2022: TSH 1.340 05/20/2023: ALT 19; AST 21 05/24/2023: BUN 54; Creatinine, Ser 5.92; Hemoglobin 8.8; Platelets 198; Potassium 4.1; Sodium 137     Component Value Date/Time   CHOL 163 05/22/2023 0419   TRIG 119 05/22/2023 0419   HDL 37 (L) 05/22/2023 0419   CHOLHDL 4.4 05/22/2023 0419   VLDL 24 05/22/2023 0419   LDLCALC 102 (H) 05/22/2023 0419   LDLDIRECT 154.2 11/04/2011 1018    Other Studies Reviewed Today: Echo from 2020 LVEF preserved PFO present  USG carotids from 2020 No stenosis in R ICA 1-49 percent stenosis in L ICA  Assessment and Plan:  # Recurrent CVA (2019, 2020, 2024) -She had a recent acute ischemic CVA in 05/2023 S/P TNK c/w hemorrhagic transformation  reversed with cryo.  Etiology large vessel disease versus cardioembolic per neurology.  Neurology appointment coming up.  30-day event monitor is shipped to her residence.  I reviewed all the echocardiograms on the EMR that showed PFO in 2020.  Echocardiogram in 2024 showed hypermobile septum.  Will obtain limited echocardiogram with bubble study to confirm PFO.  No evidence of DVT.  Obtain ultrasound carotid Doppler.  She will also benefit from TEE to rule out left atrial appendage thrombus.  Will schedule when I see her back in 6 weeks.  #CAD manifested by NSTEMI in 2017 s/p distal RCA PCI with residual mid RCA 60%, OM2 99% (not amenable to PCI) and D1 70% stenosis with normal LVEF, currently angina free -No interval angina.  Continue cardioprotective medications, aspirin 81 mg once daily and atorvastatin 80 mg nightly. -Patient has residual mid RCA 60% stenosis, OM 99% stenosis (unamenable to PCI ) and D1 70% stenosis for which medical management was recommended in 2017 -SL NTG 0.4 mg as needed -ER precautions for chest pain  # Chronic diastolic heart failure # ESRD DD -6 regimen managed by nephrology.  # HLD, unknown values -Continue statin 80 mg at bedtime, goal LDL less than 70.  # HTN, controlled -Not on amlodipine due to soft blood pressures, currently on carvedilol 12.5 mg once daily at night.  Carvedilol in the morning is removed due to soft blood pressures during dialysis.  # Carotid artery stenosis (1 to 49% in L ICA) in 2020 -Will repeat carotid artery Doppler ultrasound.   Medication Adjustments/Labs and Tests Ordered: Current medicines are reviewed at length with the patient today.  Concerns regarding medicines are outlined above.   Tests Ordered: No orders of the defined types were placed in this encounter.   Medication Changes: No orders of the defined types were placed in this encounter.   Disposition:  Follow up 6 weeks  Signed Jarel Cuadra Verne Spurr,  MD, 06/01/2023 10:36 AM    [facility] [address] [place], [place], [place] [address]

## 2023-06-03 DIAGNOSIS — I639 Cerebral infarction, unspecified: Secondary | ICD-10-CM

## 2023-06-06 ENCOUNTER — Inpatient Hospital Stay: Payer: Medicare Other | Admitting: Neurology

## 2023-06-06 ENCOUNTER — Telehealth: Payer: Self-pay

## 2023-06-06 DIAGNOSIS — Q2112 Patent foramen ovale: Secondary | ICD-10-CM

## 2023-06-06 NOTE — Telephone Encounter (Signed)
Please schedule pt for limited echo w/ bubble study- order entered for [facility] and 6 week f/u with provider.

## 2023-06-06 NOTE — Telephone Encounter (Signed)
-----   Message from Vishnu P Mallipeddi sent at 06/02/2023  8:13 PM EST ----- Regarding: Obtain limited echocardiogram with bubble study and schedule follow-up in 6 weeks. Could you please obtain limited echocardiogram with bubble study (diagnosis PFO) and schedule follow-up in 6 weeks (instead of 6 months).  I spoke with the daughter who is aware of this changes.

## 2023-06-07 ENCOUNTER — Other Ambulatory Visit: Payer: Self-pay | Admitting: Internal Medicine

## 2023-06-08 ENCOUNTER — Encounter: Payer: Self-pay | Admitting: Neurology

## 2023-06-12 ENCOUNTER — Ambulatory Visit (HOSPITAL_COMMUNITY)
Admission: RE | Admit: 2023-06-12 | Discharge: 2023-06-12 | Disposition: A | Discharge status: Home / Self Care | Payer: Medicare Other | Source: Ambulatory Visit | Attending: Internal Medicine | Admitting: Internal Medicine

## 2023-06-12 DIAGNOSIS — I639 Cerebral infarction, unspecified: Secondary | ICD-10-CM | POA: Diagnosis present

## 2023-06-16 ENCOUNTER — Ambulatory Visit (HOSPITAL_COMMUNITY)
Admission: RE | Admit: 2023-06-16 | Discharge: 2023-06-16 | Disposition: A | Discharge status: Home / Self Care | Payer: Medicare Other | Source: Ambulatory Visit | Attending: Internal Medicine | Admitting: Internal Medicine

## 2023-06-16 DIAGNOSIS — Q2112 Patent foramen ovale: Secondary | ICD-10-CM | POA: Diagnosis not present

## 2023-06-16 NOTE — Progress Notes (Signed)
*  PRELIMINARY RESULTS* Echocardiogram Limited 2-D Echocardiogram  has been performed.  Stacey Drain 06/16/2023, 1:48 PM

## 2023-06-19 ENCOUNTER — Ambulatory Visit: Payer: Self-pay

## 2023-06-19 NOTE — Patient Instructions (Signed)
 Visit Information  Thank you for taking time to visit with me today. Please don't hesitate to contact me if I can be of assistance to you.   Following are the goals we discussed today:   Goals Addressed             This Visit's Progress    Patient / caregiver statement:  Management of health conditions       Interventions Today    Flowsheet Row Most Recent Value  Chronic Disease   Chronic disease during today's visit Chronic Kidney Disease/End Stage Renal Disease (ESRD), Other  [New CVA, UTI]  General Interventions   General Interventions Discussed/Reviewed General Interventions Reviewed, Doctor Visits, Durable Medical Equipment (DME)  [evaluation of treatment plan for CVA, ESRD, UTI, and patients adherence to plan as established by providers. Assessed for concerns related to home dialysis and for new CVA.  Assessed for ongoing CVA symptoms.]  Doctor Visits Discussed/Reviewed Doctor Visits Reviewed  bethann upcoming provider visits. Advised to keep follow up visits with providers as recommended.]  Durable Medical Equipment (DME) Other  [Confirmed with daughter patient has all necessary/ needed medical equipment.]  Exercise Interventions   Exercise Discussed/Reviewed Physical Activity  [Assessed patients current physical status post CVA.]  Education Interventions   Education Provided Provided Education  [Reviewed/ discussed potential for recurrent stroke symptoms. Advised to call 911 for sudden change related to stroke symptoms.]  Provided Verbal Education On When to see the doctor  [Discussed when to contact provider for new or ongoing symptoms.]  Nutrition Interventions   Nutrition Discussed/Reviewed Nutrition Reviewed  Fredna for swallowing difficulty status post stroke and patients current eating ability. Stick to soft foods, thickened liquids, chewing food thoroughly.]  Pharmacy Interventions   Pharmacy Dicussed/Reviewed Pharmacy Topics Reviewed  [medication reviewed and  compliane discussed. Assessed for any change in treatment plan related to medications. Confirmed patient completed antibiotic course for UTI.]  Safety Interventions   Safety Discussed/Reviewed Fall Risk  [Assessed for falls. Reviewed fall precautions. Advised patient to use ambulatory devices as recommended by provider.]              Our next appointment is by telephone on 07/17/23 at 11 am.  Please call the care guide team at [phone] if you need to cancel or reschedule your appointment.   If you are experiencing a Mental Health or Behavioral Health Crisis or need someone to talk to, please call the Suicide and Crisis Lifeline: 988 call 1-800-273-TALK (toll free, 24 hour hotline)  Patient verbalizes understanding of instructions and care plan provided today and agrees to view in MyChart. Active MyChart status and patient understanding of how to access instructions and care plan via MyChart confirmed with patient.     Arvin Seip RN,BSN,CCM [place]  [facility], [facility] coordinator / Case Manager Phone: [phone]

## 2023-06-19 NOTE — Patient Outreach (Signed)
  Care Coordination   Follow Up Visit Note   06/19/2023 Name: Jennifer [name] MRN: [id number] DOB: [birth year]  Jennifer [name] is a 68 y.o. year old female who sees [name], Laine LABOR, MD for primary care. I  spoke with daughter, Juliannah Ohmann.   What matters to the patients health and wellness today?  Per chart review patient hospitalized 05/24/23- 05/30/23 due to CVA.  Daughter states patient is receiving home PT/ OT/ST with [facility].   Daughter states patient has right sided weakness with extremely limited mobility and has loss of vision in right eye. She states patient is using wheelchair and walker.  She states patient is only able to take a few steps therefore wheelchair being her main ambulatory option. Daughter denies patient sustaining any falls since hospital discharge.  Daughter states patient is able to eat now however she still has to be watched closely and has to remain upright for at least 45 minutes after eating.  Daughter reports patient is wearing a 30 day heart monitor. She states patient continues to have home dialysis.  Daughter states patient completed antibiotic treatment for UTI.    Goals Addressed             This Visit's Progress    Patient / caregiver statement:  Management of health conditions       Interventions Today    Flowsheet Row Most Recent Value  Chronic Disease   Chronic disease during today's visit Chronic Kidney Disease/End Stage Renal Disease (ESRD), Other  [New CVA, UTI]  General Interventions   General Interventions Discussed/Reviewed General Interventions Reviewed, Doctor Visits, Durable Medical Equipment (DME)  [evaluation of treatment plan for CVA, ESRD, UTI, and patients adherence to plan as established by providers. Assessed for concerns related to home dialysis and for new CVA.  Assessed for ongoing CVA symptoms.]  Doctor Visits Discussed/Reviewed Doctor Visits Reviewed  bethann upcoming provider visits. Advised to keep follow up visits  with providers as recommended.]  Durable Medical Equipment (DME) Other  [Confirmed with daughter patient has all necessary/ needed medical equipment.]  Exercise Interventions   Exercise Discussed/Reviewed Physical Activity  [Assessed patients current physical status post CVA.]  Education Interventions   Education Provided Provided Education  [Reviewed/ discussed potential for recurrent stroke symptoms. Advised to call 911 for sudden change related to stroke symptoms.]  Provided Verbal Education On When to see the doctor  [Discussed when to contact provider for new or ongoing symptoms.]  Nutrition Interventions   Nutrition Discussed/Reviewed Nutrition Reviewed  Fredna for swallowing difficulty status post stroke and patients current eating ability. Stick to soft foods, thickened liquids, chewing food thoroughly.]  Pharmacy Interventions   Pharmacy Dicussed/Reviewed Pharmacy Topics Reviewed  [medication reviewed and compliane discussed. Assessed for any change in treatment plan related to medications. Confirmed patient completed antibiotic course for UTI.]  Safety Interventions   Safety Discussed/Reviewed Fall Risk  [Assessed for falls. Reviewed fall precautions. Advised patient to use ambulatory devices as recommended by provider.]              SDOH assessments and interventions completed:  No     Care Coordination Interventions:  Yes, provided   Follow up plan: Follow up call scheduled for 07/17/23 at 11 am    Encounter Outcome:  Patient Visit Completed   Danielys Madry RN,BSN,CCM [facility]  [facility], [facility] coordinator / Case Manager Phone: [phone]

## 2023-06-20 ENCOUNTER — Other Ambulatory Visit: Payer: Self-pay | Admitting: Neurology

## 2023-06-22 ENCOUNTER — Ambulatory Visit (INDEPENDENT_AMBULATORY_CARE_PROVIDER_SITE_OTHER): Payer: Medicare Other | Admitting: Neurology

## 2023-06-22 ENCOUNTER — Encounter: Payer: Self-pay | Admitting: Neurology

## 2023-06-22 VITALS — BP 122/55 | HR 83 | Ht 67.0 in | Wt 198.2 lb

## 2023-06-22 DIAGNOSIS — I6350 Cerebral infarction due to unspecified occlusion or stenosis of unspecified cerebral artery: Secondary | ICD-10-CM

## 2023-06-22 DIAGNOSIS — I69354 Hemiplegia and hemiparesis following cerebral infarction affecting left non-dominant side: Secondary | ICD-10-CM | POA: Diagnosis not present

## 2023-06-22 DIAGNOSIS — F01518 Vascular dementia, unspecified severity, with other behavioral disturbance: Secondary | ICD-10-CM

## 2023-06-22 MED ORDER — MEMANTINE HCL 10 MG PO TABS: 10.0000 mg | ORAL_TABLET | Freq: Two times a day (BID) | ORAL | 1 refills | Status: DC

## 2023-06-22 NOTE — Patient Instructions (Signed)
 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  Recommend she increase the Namenda  dose back to 10 mg twice daily.  Continue aspirin  for stroke prevention and maintain aggressive risk factor modification with strict control of hypertension with blood pressure goal below 130/90, lipids with LDL cholesterol goal below 70 mg percent and diabetes with hemoglobin A1c goal below 6.5%.  Continue ongoing home physical occupational therapy he was encouraged to use her walker at all times discussed fall prevention precautions.  Return for follow-up in the future in 6 months with my nurse practitioner or call earlier if necessary.

## 2023-06-22 NOTE — Progress Notes (Signed)
 [facility] [address] [place]. [place] [address] [phone]       OFFICE FOLLOW-UP VISIT NOTE  [name] Date of Birth:  09-19-1955 Medical Record Number:  [id number]   Referring MD:  Laine Bart  Reason for Referral: Hallucinations and memory loss  HPI: Initial visit 11/22/2022 Jennifer [name] is a 68 year old Caucasian lady seen today for office consultation visit hallucinations and memory loss.  She is accompanied by her daughter.  History is obtained from them and review of electronic medical records.  I personally reviewed pertinent available imaging films in PACS.  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  Patient was last seen in the office for stroke follow-up by Harlene nurse practitioner on 02/12/2019 then lost to follow-up.  Patient has had some cognitive deterioration for the last 6 to 7 months.  Family has noticed that she often talks to the parents.  During screening things like getting up at 3 in the morning and trying to cook breakfast and she has not cooked for years.  Confused and gets disoriented.  She often forgets the name of this.  With her husband and daughter.  She has not exhibited any behavior agitation.  Needs help with activities of daily living like taking a bath and changing her clothes.  She is never left alone more than a few hours at a time.  She walks with a walker for the last 8 months mostly because her left leg is swollen and heavy.  She has had a few falls.  He has not had any recent obvious stroke like symptoms.   She has end-stage renal disease and recently underwent AV fistula placement in anticipation for dialysis in the future.  She has not had any brain imaging studies.  Her MMSE: 28/30 PCPs office in February 2024  has declined today to 21/30.  CT head on 01/23/2022 had shown chronic white matter changes only. Update 06/22/2023 : She returns for follow-up after last visit 6 months ago.  She is accompanied by her daughter.  Patient was admitted to the hospital from 05/20/2023 to 05/24/2023 with a stroke.  She initially presented to [facility] with sudden onset of aphasia and right-sided weakness.  She was given IV TNK by telemetry neurologist and patient was transferred to [facility] where upon arrival she had significant worsening of her exam and stat head CT was performed which showed hemorrhagic transformation in the right pontine infarct.  TNK was reversed with TXA and cryoprecipitate.  She also had a small hematoma in the right upper arm Allises fistula but this was conservatively treated.  She was monitored closely in the ICU and blood pressure was tightly controlled.  She remained stable.  MRI scan showed punctate posterior left MCA subcortical white matter infarct acute right dorsal brainstem hemorrhage which was stable.  She with changes of chronic small vessel disease and extensive chronic microhemorrhages brain.  2D echo showed ejection fraction of 60 to 65%.  Lower extremity venous Dopplers were negative for DVT.  LDL cholesterol 102 mg percent and hemoglobin A1c was 6.2.  Patient was discharged home with home health and his family prefer that over inpatient rehab.  Patient still getting home health physical occupational therapy.  She can walk a little bit with 1 person assist but not for  long distances.  The daughter states that the physical deficits seems to be improving but cognitively she has worsened since her Namenda  dose was reduced from 10 mg twice daily to 5 mg twice daily when she left the hospital this time.  She is having hallucinations and seeing things people were not there.  She is living at home with her husband and 1 daughter.  Mini-Mental status exam today she scored 19/30. ROS:   14 system  review of systems is positive for hallucinations, disorientation, confusion, memory loss, poor balance, falls, bruising and all other systems negative  PMH:  Past Medical History:  Diagnosis Date   CAD (coronary artery disease)    cath 5/23 100% dist RCA lesion treated with 2 overlapping Integrity Resolute DES ( 2.25x55mm, 2.25x35mm), 60% mid RCA treated medically, 99% OM2 not amenable to PCI, 70% D1 lesion, EF normal   Hypercholesteremia    Hypertension    Hypothyroidism    Kidney disease    Renal disorder    Stroke (HCC) 10/2018   Thyroid  disease    Type 2 diabetes mellitus (HCC) 10/08/2018    Social History:  Social History   Socioeconomic History   Marital status: Married    Spouse name: Not on file   Number of children: Not on file   Years of education: Not on file   Highest education level: Not on file  Occupational History   Not on file  Tobacco Use   Smoking status: Former    Current packs/day: 0.00    Types: Cigarettes    Quit date: 05/13/2021    Years since quitting: 2.1   Smokeless tobacco: Never  Vaping Use   Vaping status: Never Used  Substance and Sexual Activity   Alcohol  use: No    Alcohol /week: 0.0 standard drinks of alcohol    Drug use: No   Sexual activity: Not on file  Other Topics Concern   Not on file  Social History Narrative   Not on file   Social Drivers of Health   Financial Resource Strain: Low Risk  (03/07/2023)   Overall Financial Resource Strain (CARDIA)    Difficulty of Paying Living Expenses: Not hard at all  Food Insecurity: No Food Insecurity (05/25/2023)   Hunger Vital Sign    Worried About Running Out of Food in the Last Year: Never true    Ran Out of Food in the Last Year: Never true  Transportation Needs: No Transportation Needs (05/25/2023)   PRAPARE - Administrator, Civil Service (Medical): No    Lack of Transportation (Non-Medical): No  Physical Activity: Inactive (03/07/2023)   Exercise Vital Sign    Days  of Exercise per Week: 0 days    Minutes of Exercise per Session: 0 min  Stress: No Stress Concern Present (03/07/2023)   [employer] of Occupational Health - Occupational Stress Questionnaire    Feeling of Stress : Not at all  Social Connections: Socially Integrated (03/07/2023)   Social Connection and Isolation Panel [NHANES]    Frequency of Communication with Friends and Family: More than three times a week    Frequency of Social Gatherings with Friends and Family: More than three times a week    Attends Religious Services: More than 4 times per year    Active Member of [employer] or Organizations: Yes    Attends Banker Meetings: More than 4 times per year    Marital Status: Married  Catering Manager Violence: Not At Risk (05/25/2023)  Humiliation, Afraid, Rape, and Kick questionnaire    Fear of Current or Ex-Partner: No    Emotionally Abused: No    Physically Abused: No    Sexually Abused: No    Medications:   Current Outpatient Medications on File Prior to Visit  Medication Sig Dispense Refill   artificial tears (LACRILUBE) OINT ophthalmic ointment Place into both eyes at bedtime as needed for up to 62 doses for dry eyes. 30 g 2   aspirin  81 MG chewable tablet Chew 1 tablet (81 mg total) by mouth daily. 30 tablet 2   atorvastatin  (LIPITOR ) 80 MG tablet TAKE 1 TABLET BY MOUTH ONCE DAILY AT  6  IN  THE  EVENING 30 tablet 3   BD PEN NEEDLE NANO 2ND GEN 32G X 4 MM MISC Inject into the skin 3 (three) times daily.     buPROPion  (WELLBUTRIN  SR) 150 MG 12 hr tablet Take 1 tablet by mouth twice daily 180 tablet 2   carvedilol  (COREG ) 12.5 MG tablet Take 1 tablet (12.5 mg total) by mouth daily. On non-dialysis days (Patient taking differently: Take 12.5 mg by mouth at bedtime. On non-dialysis days) 30 tablet 1   DULoxetine  (CYMBALTA ) 60 MG capsule Take 1 capsule by mouth once daily 90 capsule 2   folic acid  (FOLVITE ) 1 MG tablet Take 1 tablet by mouth once daily 30 tablet 0    furosemide  (LASIX ) 80 MG tablet Take 80 mg by mouth daily. On non-dialysis days     insulin  lispro protamine-lispro (HUMALOG  75/25 MIX) (75-25) 100 UNIT/ML SUSP injection Inject 10-25 Units into the skin See admin instructions. Inject 25 units with breakfast, 10 units at lunch, 25 units at dinner     levothyroxine  (SYNTHROID ) 175 MCG tablet 1 tablet in the morning on an empty stomach Orally Once a day     nitroGLYCERIN  (NITROSTAT ) 0.4 MG SL tablet Place 0.4 mg under the tongue every 5 (five) minutes as needed for chest pain.     cephALEXin  (KEFLEX ) 500 MG capsule Give 500 mg (open and sprinkle) after dialysis for 5 doses (Patient not taking: Reported on 06/22/2023) 5 capsule 0   ferrous sulfate  325 (65 FE) MG tablet Take 325 mg by mouth as needed (given when needed at dialysis). (Patient not taking: Reported on 06/01/2023)     midodrine  (PROAMATINE ) 10 MG tablet Take 10 mg by mouth daily. 30 min-1 hr prior to HD on hemodialysis days (Patient not taking: Reported on 06/01/2023)     polyvinyl alcohol  (LIQUIFILM TEARS) 1.4 % ophthalmic solution Place 1 drop into both eyes every 4 (four) hours. (Patient not taking: Reported on 06/22/2023) 15 mL 0   No current facility-administered medications on file prior to visit.    Allergies:   Allergies  Allergen Reactions   Tenecteplase  Other (See Comments)   Metformin Hcl Other (See Comments)    Unknown reaction     Physical Exam General: Mildly obese pleasant middle-aged Caucasian lady seated, in no evident distress Head: head normocephalic and atraumatic.   Neck: supple with no carotid or supraclavicular bruits Cardiovascular: regular rate and rhythm, no murmurs Musculoskeletal: no deformity Skin:  no rash/petichiae Vascular:  Normal pulses all extremities  Neurologic Exam Mental Status: Awake and fully alert. Oriented to place and time. Recent and remote memory are poor. Attention span, concentration and fund of knowledge diminished. Mood and affect  appropriate.  Poor recall 0/3.  Able to name only 11 animals which can walk on 4 legs.  Clock drawing  2/4.  Mental status exam scored 19/30. Cranial Nerves: Fundoscopic exam not done. Pupils equal, briskly reactive to light. Extraocular movements full without nystagmus. Visual fields full to confrontation. Hearing intact. Facial sensation intact.  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  Tongue, palate moves normally and symmetrically.  Motor: Mild spastic left hemiparesis 4/5 strength with weakness of left grip and intrinsic hand muscles and left hip flexors and ankle dorsiflexors.  Tone is increased on the left.  Normal strength on the right. Sensory.:  Diminished left hemibody sensation to touch , pinprick , position and vibratory sensation.  Coordination: Rapid alternating movements normal in all extremities. Finger-to-nose and heel-to-shin performed accurately bilaterally. Gait and Station: Deferred as patient is unable to walk Reflexes: 1+ and symmetric. Toes downgoing.    NIHHS 5 Modified Rankin  4     06/22/2023    1:39 PM 11/22/2022    1:39 PM  MMSE - Mini Mental State Exam  Orientation to time 3 1  Orientation to Place 3 5  Registration 3 3  Attention/ Calculation 1 1  Recall 2 3  Language- name 2 objects 2 2  Language- repeat 1 1  Language- follow 3 step command 3 3  Language- read & follow direction 1 0  Write a sentence 0 1  Copy design 0 1  Total score 19 21     ASSESSMENT: 68 year old Caucasian lady with subacute memory loss and cognitive impairment likely due to mixed vascular and Alzheimer's dementia.  Prior history of left corpus callosum lacunar infarct in 2020 and  old right basal ganglia infarct in 2019.  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     PLAN:  I had a long discussion with the patient and her daughter regarding recent pontine  stroke with post TNK hemorrhage and disability left hemiparesis and mixed vascular and Alzheimer's dementia.  Recommend she increase the Namenda  dose back to 10 mg twice daily.  Continue aspirin  for stroke prevention and maintain aggressive risk factor modification with strict control of hypertension with blood pressure goal below 130/90, lipids with LDL cholesterol goal below 70 mg percent and diabetes with hemoglobin A1c goal below 6.5%.  Continue ongoing home physical occupational therapy She was encouraged to use her walker at all times discussed fall prevention precautions.  Return for follow-up in the future in 6 months with my nurse practitioner or call earlier if necessary.  Return for follow-up in the future in 4 months or call earlier if necessary. Greater than 50% time during this prolonged 40-minute   visit was spent on counseling and coordination of care about further management.  Strokes and Eather Popp, MD Note: This document was prepared with digital dictation and possible smart phrase technology. Any transcriptional errors that result from this process are unintentional.

## 2023-06-26 ENCOUNTER — Ambulatory Visit: Payer: Self-pay | Admitting: Family Medicine

## 2023-06-26 NOTE — Telephone Encounter (Signed)
 1st attempt to call pt, no answer, left VM to return call to office.  Copied from CRM [id number]. Topic: Clinical - Medical Advice >> Jun 26, 2023  4:09 PM Franky GRADE wrote: Reason for CRM: Lenward from [facility] is calling to report a fall, Patient states she did not hit her head or has any concerns.

## 2023-06-26 NOTE — Telephone Encounter (Signed)
 Returned call and spoke to pt's daughter Tillman to follow up regarding fall. She reports pt slid off toilet today. No injuries or concerns at this time.  Reason for Disposition  General information question, no triage required and triager able to answer question  Answer Assessment - Initial Assessment Questions 1. REASON FOR CALL or QUESTION: What is your reason for calling today? or How can I best help you? or What question do you have that I can help answer?     Calling to follow up with pt regarding fall  Protocols used: Information Only Call - No Triage-A-AH

## 2023-06-27 NOTE — Telephone Encounter (Signed)
 Aware, glad there were no injuries

## 2023-06-27 NOTE — Telephone Encounter (Signed)
 FYI to PCP

## 2023-07-03 ENCOUNTER — Ambulatory Visit: Payer: Medicare Other | Admitting: Neurology

## 2023-07-03 DIAGNOSIS — E113513 Type 2 diabetes mellitus with proliferative diabetic retinopathy with macular edema, bilateral: Secondary | ICD-10-CM

## 2023-07-03 DIAGNOSIS — E1122 Type 2 diabetes mellitus with diabetic chronic kidney disease: Secondary | ICD-10-CM | POA: Diagnosis not present

## 2023-07-03 DIAGNOSIS — E039 Hypothyroidism, unspecified: Secondary | ICD-10-CM

## 2023-07-03 DIAGNOSIS — J449 Chronic obstructive pulmonary disease, unspecified: Secondary | ICD-10-CM

## 2023-07-03 DIAGNOSIS — N186 End stage renal disease: Secondary | ICD-10-CM

## 2023-07-03 DIAGNOSIS — I132 Hypertensive heart and chronic kidney disease with heart failure and with stage 5 chronic kidney disease, or end stage renal disease: Secondary | ICD-10-CM | POA: Diagnosis not present

## 2023-07-03 DIAGNOSIS — I5032 Chronic diastolic (congestive) heart failure: Secondary | ICD-10-CM | POA: Diagnosis not present

## 2023-07-03 DIAGNOSIS — F0280 Dementia in other diseases classified elsewhere without behavioral disturbance: Secondary | ICD-10-CM

## 2023-07-03 DIAGNOSIS — E1151 Type 2 diabetes mellitus with diabetic peripheral angiopathy without gangrene: Secondary | ICD-10-CM

## 2023-07-03 DIAGNOSIS — I69354 Hemiplegia and hemiparesis following cerebral infarction affecting left non-dominant side: Secondary | ICD-10-CM | POA: Diagnosis not present

## 2023-07-03 DIAGNOSIS — I872 Venous insufficiency (chronic) (peripheral): Secondary | ICD-10-CM

## 2023-07-03 DIAGNOSIS — Z992 Dependence on renal dialysis: Secondary | ICD-10-CM

## 2023-07-06 ENCOUNTER — Ambulatory Visit: Payer: Medicare Other | Attending: Internal Medicine

## 2023-07-06 DIAGNOSIS — I639 Cerebral infarction, unspecified: Secondary | ICD-10-CM

## 2023-07-17 ENCOUNTER — Ambulatory Visit: Payer: Medicare Other

## 2023-07-17 NOTE — Patient Outreach (Signed)
  Care Coordination   07/17/2023 Name: Jennifer [name] MRN: [id number] DOB: 1956/02/27   Care Coordination Outreach Attempts:  An unsuccessful outreach was attempted for an appointment today. Telephone call attempt to patients daughter, Jennifer [name].  Unable to reach. HIPAA compliant message left with return call phone number.   Follow Up Plan:  Additional outreach attempts will be made to offer the patient complex care management information and services.   Encounter Outcome:  No Answer   Care Coordination Interventions:  No, not indicated    George Ina RN, BSN, CCM [employer], Population Health Case Manager Phone: [phone]

## 2023-07-18 ENCOUNTER — Other Ambulatory Visit: Payer: Self-pay | Admitting: Family Medicine

## 2023-07-19 NOTE — Telephone Encounter (Signed)
 Last filled on 10/19/22 #90 caps/ 2 refill, last OV was a hospital f/u on 05/30/23

## 2023-07-20 ENCOUNTER — Telehealth: Payer: Self-pay | Admitting: *Deleted

## 2023-07-20 NOTE — Telephone Encounter (Signed)
 Copied from CRM [id number]. Topic: Clinical - Home Health Verbal Orders >> Jul 20, 2023  1:31 PM Victoria A wrote: Caller/Agency: Dagoberto Cal Corean Salome Callback Number: [phone] Can leave verbal on Voicemail Service Requested: Speech Therapy Frequency: Re certification 30 days for Speech and 60 days for PT and OT Any new concerns about the patient? No

## 2023-07-20 NOTE — Telephone Encounter (Signed)
Please ok that verbal order  

## 2023-07-20 NOTE — Telephone Encounter (Signed)
 VO left of voicemail

## 2023-07-25 ENCOUNTER — Other Ambulatory Visit: Payer: Self-pay | Admitting: Neurology

## 2023-07-26 NOTE — Telephone Encounter (Signed)
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  I recommend we start taking folic acid 1 mg daily and in case she smokes cigarettes she needs to quit

## 2023-07-28 ENCOUNTER — Telehealth: Payer: Self-pay | Admitting: Family Medicine

## 2023-07-28 NOTE — Telephone Encounter (Signed)
Anette said this was just a [place]

## 2023-07-28 NOTE — Telephone Encounter (Signed)
Jennifer [name] from home health Patient had a fall on feb 10th she does not have any injuries  call back  number is [phone]

## 2023-07-28 NOTE — Telephone Encounter (Signed)
Aware= thanks  Appreciate update

## 2023-08-01 ENCOUNTER — Other Ambulatory Visit: Payer: Self-pay | Admitting: Family Medicine

## 2023-08-03 NOTE — Progress Notes (Unsigned)
Cardiology Office Note    Date:  08/04/2023  ID:  Jennifer [name], Jennifer [name] 1956/03/22, MRN [id number] Cardiologist: Marjo Bicker, MD    History of Present Illness:    Jennifer [name] is a 68 y.o. female with past medical history of CAD (s/p NSTEMI in 2017 with DESx2 to RCA), prior CVA (occurring in 05/2023 and received TNK with hemorrhagic transformation and received reversal with TXA and cryo), HTN, HLD, IDDM, ESRD and prior tobacco use who presents to the office today for 6-week follow-up.  She was last examined by Dr. Jenene Slicker in 05/2023 following her recent hospitalization for CVA. She was mostly wheelchair-bound at that time and was using a walker for ambulation. Denied any recent anginal symptoms at that time. Prior echocardiogram images had shown a PFO and echocardiogram in 05/2023 was read as showing the septum was hypermobile. 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 Was recommend to consider a TEE at the time of her next visit.   In talking with the patient and her daughter today, they report things have overall been stable since her last visit. She continues to work with home health PT and OT. She is also on hemodialysis and is able to do this at home with the assistance of family. She undergoes dialysis 4 days/week. She denies any recent dyspnea on exertion or chest pain.  No recent palpitations, orthopnea, PND or pitting edema. Her appetite has significantly improved over the past few months.    Studies Reviewed:   EKG: EKG is not ordered today.   Echocardiogram: 05/2023 IMPRESSIONS     1. Left ventricular ejection fraction, by estimation, is 60 to 65%. The  left ventricle has normal function. The left ventricle has no regional  wall motion abnormalities. There is mild concentric left ventricular  hypertrophy. Left ventricular diastolic  parameters are  consistent with Grade I diastolic dysfunction (impaired  relaxation).   2. Right ventricular systolic function is normal. The right ventricular  size is normal.   3. Left atrial size was mildly dilated.   4. Septum is hypermobile.   5. The mitral valve is grossly normal. No evidence of mitral valve  regurgitation.   6. The aortic valve is normal in structure. Aortic valve regurgitation is  not visualized.   7. The inferior vena cava is normal in size with greater than 50%  respiratory variability, suggesting right atrial pressure of 3 mmHg.   Conclusion(s)/Recommendation(s): Septum is hypermobile; consider repeat  limited TTE with bubble study.   Bubble Study: 06/2023 IMPRESSIONS     1. Limited echo for bubble study In 4 chamber view atrial septum is  mobile and aneurysmal Bubble study is negative for right to left shunting.   Event Monitor: 06/2023   Patch wear time was for 30 days.   Normal sinus rhythm predominantly ranging from 62 to 121 bpm with an average HR 78 bpm.   No evidence of atrial or ventricular arrhythmias.   No evidence of high-grade AV block or pauses.   <1% PAC burden and <1% PVC burden.   No patient triggered events noted.    Physical Exam:   VS:  BP 120/64 (BP Location: Left Arm, Patient Position: Sitting, Cuff Size: Large)   Pulse 84   Ht 5\' 7"  (1.702 m)   BMI 31.04 kg/m    Wt Readings from Last 3 Encounters:  06/22/23 198 lb  3.1 oz (89.9 kg)  05/24/23 193 lb 12.6 oz (87.9 kg)  03/07/23 218 lb (98.9 kg)     GEN: Pleasant female appearing in no acute distress. Sitting in wheelchair.  NECK: No JVD; No carotid bruits CARDIAC: RRR, no murmurs, rubs, gallops RESPIRATORY:  Clear to auscultation without rales, wheezing or rhonchi  ABDOMEN: Appears non-distended. No obvious abdominal masses. EXTREMITIES: No clubbing or cyanosis. No pitting edema.  Distal pedal pulses are 2+ bilaterally.   Assessment and Plan:   1. CAD - She is s/p NSTEMI in 2017  with DESx2 to RCA. While activity has been limited since her CVA, she is working with PT and OT and denies any recent anginal symptoms. Continue current medical therapy with ASA 81mg  daily, Atorvastatin 80mg  daily and Coreg 12.5mg  BID.   2. HTN - BP is well-controlled at 120/64 during today's visit. Continue current medical therapy with Coreg 12.5mg  BID.   3. HLD - LDL was at 102 in 05/2023 and she has been on Atorvastatin 80mg  daily. If LDL remains above goal of 70, would add Zetia or refer to the Pharm D. Clinic for consideration of PCSK9 inhibitor therapy.   4. ESRD - On home HD 4/days per week which is managed by her family. Takes Lasix 80mg  daily on non-HD days. Followed by [facility].   5. History of CVA - Recent Bubble Study showed no evidence of a PFO and recent 30-day monitor showed no significant arrhythmias. Confirmed with Dr. Jenene Slicker that TEE is not indicated at this time given the etiology was felt to be due to small-vessel disease. Her daughter mentions that an ILR was discussed at her prior visit. Will reach out to Neurology to verify if needed. If so, can refer to EP in [place] but given the etiology was not felt to be cardioembolic, might not be necessary. Will follow-up with the patient's daughter in regards to their reply.   Signed, Ellsworth Lennox, PA-C

## 2023-08-04 ENCOUNTER — Encounter: Payer: Self-pay | Admitting: Student

## 2023-08-04 ENCOUNTER — Ambulatory Visit: Payer: Self-pay

## 2023-08-04 ENCOUNTER — Ambulatory Visit: Payer: Medicare Other | Attending: Student | Admitting: Student

## 2023-08-04 VITALS — BP 120/64 | HR 84 | Ht 67.0 in

## 2023-08-04 DIAGNOSIS — E785 Hyperlipidemia, unspecified: Secondary | ICD-10-CM | POA: Insufficient documentation

## 2023-08-04 DIAGNOSIS — Z8673 Personal history of transient ischemic attack (TIA), and cerebral infarction without residual deficits: Secondary | ICD-10-CM | POA: Insufficient documentation

## 2023-08-04 DIAGNOSIS — N186 End stage renal disease: Secondary | ICD-10-CM | POA: Diagnosis present

## 2023-08-04 DIAGNOSIS — I10 Essential (primary) hypertension: Secondary | ICD-10-CM | POA: Insufficient documentation

## 2023-08-04 DIAGNOSIS — I2510 Atherosclerotic heart disease of native coronary artery without angina pectoris: Secondary | ICD-10-CM | POA: Diagnosis present

## 2023-08-04 NOTE — Patient Instructions (Signed)
Visit Information  Thank you for taking time to visit with me today. Please don't hesitate to contact me if I can be of assistance to you.   Following are the goals we discussed today:   Goals Addressed             This Visit's Progress    Management of health conditions       Interventions Today    Flowsheet Row Most Recent Value  Chronic Disease   Chronic disease during today's visit Chronic Kidney Disease/End Stage Renal Disease (ESRD), Other  [status post CVA, Alzheimer's dementia]  General Interventions   General Interventions Discussed/Reviewed General Interventions Reviewed, Doctor Visits, Labs  [place] of current treatment plan for listed health conditions and patients adherence to plan as established by provider. Assessed for home dialysis status, ongoing post stroke deficits, any new or ongoing symptoms.]  Labs Kidney Function  [Discussed kidney function lab.]  Doctor Visits Discussed/Reviewed Doctor Visits Reviewed  Annabell Sabal upcoming provider visits. Advised to keep follow up visits with providers.]  Education Interventions   Education Provided Provided Education  [Advised to notify provider for increase weight gain/ SOB, overall worsening symptoms. Advised to call 911 for severe/ stroke like symptoms. Reviewed stroke like symptoms.]  Provided Verbal Education On Other  [Inquired if patient continues to receive home health services.]  Mental Health Interventions   Mental Health Discussed/Reviewed Mental Health Reviewed  Jorje Guild listening and support.  Discussed redirecting conversations with patient when patient becomes agitated.]  Pharmacy Interventions   Pharmacy Dicussed/Reviewed Pharmacy Topics Reviewed  [medications reviewed and compliance discussed.]  Safety Interventions   Safety Discussed/Reviewed Fall Risk  [Assessed for fall.  fall prevention discussed. Create safe enviornment for patient.]              Our next appointment is by telephone on 05/09/24  at 11 am  Please call the care guide team at [phone] if you need to cancel or reschedule your appointment.   If you are experiencing a Mental Health or Behavioral Health Crisis or need someone to talk to, please call the Suicide and Crisis Lifeline: 988 call 1-800-273-TALK (toll free, 24 hour hotline)  Patient verbalizes understanding of instructions and care plan provided today and agrees to view in MyChart. Active MyChart status and patient understanding of how to access instructions and care plan via MyChart confirmed with patient.     George Ina RN, BSN, CCM [employer], Population Health Case Manager Phone: [phone]

## 2023-08-04 NOTE — Patient Outreach (Signed)
Care Coordination   Follow Up Visit Note   08/04/2023 Name: Jennifer [name] MRN: [id number] DOB: 1955-11-24  Jennifer [name] is a 68 y.o. year old female who sees [name], Audrie Gallus, MD for primary care. I  spoke with daughter, Jennifer [name].   What matters to the patients health and wellness today?  Daughter states patients home dialysis are going well and she is adjusting well. She states she and her sister are managing patients dialysis. Daughter denies patient having any increase in symptoms related to SOB or swelling. She reports todays' weight is 190 lbs. Daughter states patient continues to have home PT/ OT/ ST.  She states patient had a follow up visit with the neurologist and he increased patients namenda back to 10 mg due to patient having increase in hallucinations.  Daughter states she has seen a mild improvement. She states patient has a tendency to become agitated with spouse and daughter who live with her.  Daughter reports patient had  a recent fall. She states patients husband was with her when she slid off of the toilet. Denied patient sustaining any injury from fall.      Goals Addressed             This Visit's Progress    Management of health conditions       Interventions Today    Flowsheet Row Most Recent Value  Chronic Disease   Chronic disease during today's visit Chronic Kidney Disease/End Stage Renal Disease (ESRD), Other  [status post CVA, Alzheimer's dementia]  General Interventions   General Interventions Discussed/Reviewed General Interventions Reviewed, Doctor Visits, Labs  [place] of current treatment plan for listed health conditions and patients adherence to plan as established by provider. Assessed for home dialysis status, ongoing post stroke deficits, any new or ongoing symptoms.]  Labs Kidney Function  [Discussed kidney function lab.]  Doctor Visits Discussed/Reviewed Doctor Visits Reviewed  Annabell Sabal upcoming provider visits. Advised to keep follow up  visits with providers.]  Education Interventions   Education Provided Provided Education  [Advised to notify provider for increase weight gain/ SOB, overall worsening symptoms. Advised to call 911 for severe/ stroke like symptoms. Reviewed stroke like symptoms.]  Provided Verbal Education On Other  [Inquired if patient continues to receive home health services.]  Mental Health Interventions   Mental Health Discussed/Reviewed Mental Health Reviewed  Jorje Guild listening and support.  Discussed redirecting conversations with patient when patient becomes agitated.]  Pharmacy Interventions   Pharmacy Dicussed/Reviewed Pharmacy Topics Reviewed  [medications reviewed and compliance discussed.]  Safety Interventions   Safety Discussed/Reviewed Fall Risk  [Assessed for fall.  fall prevention discussed. Create safe enviornment for patient.]              SDOH assessments and interventions completed:  No     Care Coordination Interventions:  Yes, provided   Follow up plan: Follow up call scheduled for 09/07/23 at 11 am    Encounter Outcome:  Patient Visit Completed   George Ina RN, BSN, CCM [place]  [facility], Population Health Case Manager Phone: [phone]

## 2023-08-04 NOTE — Patient Instructions (Signed)
Medication Instructions:  Your physician recommends that you continue on your current medications as directed. Please refer to the Current Medication list given to you today.  *If you need a refill on your cardiac medications before your next appointment, please call your pharmacy*   Lab Work: NONE   If you have labs (blood work) drawn today and your tests are completely normal, you will receive your results only by: MyChart Message (if you have MyChart) OR A paper copy in the mail If you have any lab test that is abnormal or we need to change your treatment, we will call you to review the results.   Testing/Procedures: NONE    Follow-Up: At [facility], you and your health needs are our priority.  As part of our continuing mission to provide you with exceptional heart care, we have created designated Provider Care Teams.  These Care Teams include your primary Cardiologist (physician) and Advanced Practice Providers (APPs -  Physician Assistants and Nurse Practitioners) who all work together to provide you with the care you need, when you need it.  We recommend signing up for the patient portal called "MyChart".  Sign up information is provided on this After Visit Summary.  MyChart is used to connect with patients for Virtual Visits (Telemedicine).  Patients are able to view lab/test results, encounter notes, upcoming appointments, etc.  Non-urgent messages can be sent to your provider as well.   To learn more about what you can do with MyChart, go to [email or web address].    Your next appointment:    June   Provider:   Luane School, MD    Other Instructions Thank you for choosing [facility]!

## 2023-08-07 ENCOUNTER — Ambulatory Visit: Payer: Self-pay | Admitting: Family Medicine

## 2023-08-07 NOTE — Telephone Encounter (Signed)
 Patient does not have upcomming appointment. Do you need [place] to call and get set up?

## 2023-08-07 NOTE — Telephone Encounter (Signed)
 She has numerous specialists- does not need follow up soon unless injured

## 2023-08-07 NOTE — Telephone Encounter (Addendum)
 Copied From CRM [id number]. Reason for Triage:  Darel Hong from [facility] called to notify the patient had a fall yesterday, no major injury.

## 2023-08-07 NOTE — Telephone Encounter (Signed)
  Chief Complaint: Fall Symptoms: None Frequency: intermittent Pertinent Negatives: Patient denies injury Disposition: [] ED /[] Urgent Care (no appt availability in office) / [] Appointment(In office/virtual)/ []  [facility]/ [] Home Care/ [] Refused Recommended Disposition /[] [place] [facility]/ [x]  Follow-up with PCP Additional Notes: HHC provider called to report that paient had fall. RN called, spoke with pts daughter [name] who reports that patient was left alone for approx 5-42min and attempted to get out of her sitting chair and walk across the room. No one was present to actually witness the fall, but it appeared that pt may have just slid to the floor since she has been unable to stand on her own at all. Pt was found on the carpet laying down. No cuts, scraps, or bruises. No head injuries per daughter. Family assisted her back into her chair. No appt made at this time as there are no injuries reported. RN advised daughter to make physician aware at her next PCP visit.   Reason for Disposition  [1] Recent fall AND [2] no injury  Answer Assessment - Initial Assessment Questions 1. MECHANISM: "How did the fall happen?"     Pt tried to get up out of chair and fell  2. DOMESTIC VIOLENCE AND ELDER ABUSE SCREENING: "Did you fall because someone pushed you or tried to hurt you?" If Yes, ask: "Are you safe now?"     Unknown  3. ONSET: "When did the fall happen?" (e.g., minutes, hours, or days ago)     Yesterday  4. LOCATION: "What part of the body hit the ground?" (e.g., back, buttocks, head, hips, knees, hands, head, stomach)     Fell forward and was found on her belly  5. INJURY: "Did you hurt (injure) yourself when you fell?" If Yes, ask: "What did you injure? Tell me more about this?" (e.g., body area; type of injury; pain severity)"     No injury  6. PAIN: "Is there any pain?" If Yes, ask: "How bad is the pain?" (e.g., Scale 1-10; or mild,  moderate, severe)   - NONE  (0): No pain   - MILD (1-3): Doesn't interfere with normal activities    - MODERATE (4-7): Interferes with normal activities or awakens from sleep    - SEVERE (8-10): Excruciating pain, unable to do any normal activities      No pain  7. SIZE: For cuts, bruises, or swelling, ask: "How large is it?" (e.g., inches or centimeters)      No cuts or bruises  8. PREGNANCY: "Is there any chance you are pregnant?" "When was your last menstrual period?"     N/a  9. OTHER SYMPTOMS: "Do you have any other symptoms?" (e.g., dizziness, fever, weakness; new onset or worsening).      Denies dizziness  10. CAUSE: "What do you think caused the fall (or falling)?" (e.g., tripped, dizzy spell)      Larey Seat out of chair  Protocols used: Falls and [facility]

## 2023-08-19 ENCOUNTER — Telehealth: Payer: Self-pay | Admitting: Student

## 2023-08-19 NOTE — Telephone Encounter (Addendum)
    Please let the patient know that I reviewed with Neurology and they do not recommend a Loop Recorder at this time.   Thanks,  Ellsworth Lennox, PA-C 08/19/2023, 1:42 PM

## 2023-08-21 NOTE — Telephone Encounter (Signed)
 Called pt. No answer. Left msg to call back.

## 2023-08-21 NOTE — Telephone Encounter (Signed)
 Pt's daughter [name] notified that B. Strader spoke with Neurology and a Loop recorder is not recommended at this time.

## 2023-08-28 ENCOUNTER — Telehealth: Payer: Self-pay

## 2023-08-28 NOTE — Patient Outreach (Signed)
 First telephone outreach attempt to obtain mRS. No answer. Left message for returned call.  [facility]  Population Health Care Management Assistant  Direct Dial: [phone]  Fax: [phone] Website: [email or web address]

## 2023-09-01 ENCOUNTER — Telehealth: Payer: Self-pay

## 2023-09-01 NOTE — Patient Outreach (Signed)
 Second telephone outreach attempt to obtain mRS. No answer. Left message for returned call.  [facility]  Population Health Care Management Assistant  Direct Dial: [phone]  Fax: [phone] Website: [email or web address]

## 2023-09-05 ENCOUNTER — Telehealth: Payer: Self-pay

## 2023-09-05 NOTE — Patient Outreach (Signed)
 Telephone outreach to patient's daughter Almira Coaster to obtain mRS was successfully completed. MRS= 5  [facility] Health Care Management Assistant  Direct Dial: [phone]  Fax: [phone] Website: [email or web address]

## 2023-09-06 ENCOUNTER — Other Ambulatory Visit: Payer: Self-pay

## 2023-09-06 MED ORDER — ATORVASTATIN CALCIUM 80 MG PO TABS: ORAL_TABLET | ORAL | 3 refills | Status: AC

## 2023-09-06 NOTE — Telephone Encounter (Signed)
 Refilled lipitor to [facility]

## 2023-09-07 ENCOUNTER — Ambulatory Visit: Payer: Self-pay

## 2023-09-07 NOTE — Patient Outreach (Signed)
 Care Coordination   Follow Up Visit Note   09/07/2023 Name: Jennifer [name] MRN: [id number] DOB: 13-May-1956  Jennifer [name] is a 68 y.o. year old female who sees [name], Audrie Gallus, MD for primary care. I  spoke with Almira Coaster Barns daughter/ designated party release.   What matters to the patients health and wellness today?  Daughter states patient had follow up with cardiologist on 08/04/23. She states patient is doing about the same. She states patient falls more often because she doesn't remember she is not able to walk by herself.  Daughter denies any injuries from falls.  Daughter states patients namenda was increased however she is not seeing much improvement. She states patient is becoming more snappy with those living with her: husband and daughter.  Daughter states patient continues to have PT services. She states patient is not doing any of the exercises on non PT day so she and her sister decided they would discontinue services at re certification.  Daughter states patient threw up last night after her dialysis and states earlier this week patients BP dropped after dialysis. She states they were trained on what to do if patients BP dropped during or after dialysis. Daughter states patients appetite is good. Daughter states she and her sister are considering getting camera's in the house to monitor patient and a bed / chair alarm. Daughter declined any additional dementia education information at this time.    Goals Addressed             This Visit's Progress    Management and education of health conditions       Interventions Today    Flowsheet Row Most Recent Value  Chronic Disease   Chronic disease during today's visit Chronic Kidney Disease/End Stage Renal Disease (ESRD), Other  [status post CVA, Alzheimer's dementia]  General Interventions   General Interventions Discussed/Reviewed General Interventions Reviewed, Doctor Visits  [evaluation of current treatment plan for listed health  condition and patients adherence to plan as established by provider. Assessed for new/ ongoing symptoms.]  Doctor Visits Discussed/Reviewed Doctor Visits Reviewed  Annabell Sabal upcoming provider visits.  Advised to keep follow up visits as recommended.]  Exercise Interventions   Exercise Discussed/Reviewed Physical Activity  [assessed patients activity level.  Assessed for ongoing PT services.]  Education Interventions   Education Provided Provided Education  [Advised to notify provider of any concerns or changes in patients status. Contact nephrologist for issues concerning dialysis treatment and for elevated or low blood pressures during/ after dialysis. continue to monitor blood pressures closely and record]  Provided Verbal Education On Nutrition  [assessed patients appetite.]  Pharmacy Interventions   Pharmacy Dicussed/Reviewed Pharmacy Topics Reviewed  [medications reviewed and compliance discussed and advised.]  Safety Interventions   Safety Discussed/Reviewed Fall Risk  [falls assessed. Fall prevention discussed. Agreed with daughter that camera's in the home and bed/ chair alarm would be beneficial for fall safety concerns.]                SDOH assessments and interventions completed:  No     Care Coordination Interventions:  Yes, provided   Follow up plan: Follow up call scheduled for 10/13/23 at 11 am    Encounter Outcome:  Patient Visit Completed

## 2023-09-07 NOTE — Patient Instructions (Signed)
 Visit Information  Thank you for taking time to visit with me today. Please don't hesitate to contact me if I can be of assistance to you.   Following are the goals we discussed today:   Goals Addressed             This Visit's Progress    Management and education of health conditions       Interventions Today    Flowsheet Row Most Recent Value  Chronic Disease   Chronic disease during today's visit Chronic Kidney Disease/End Stage Renal Disease (ESRD), Other  [status post CVA, Alzheimer's dementia]  General Interventions   General Interventions Discussed/Reviewed General Interventions Reviewed, Doctor Visits  [evaluation of current treatment plan for listed health condition and patients adherence to plan as established by provider. Assessed for new/ ongoing symptoms.]  Doctor Visits Discussed/Reviewed Doctor Visits Reviewed  Annabell Sabal upcoming provider visits.  Advised to keep follow up visits as recommended.]  Exercise Interventions   Exercise Discussed/Reviewed Physical Activity  [assessed patients activity level.  Assessed for ongoing PT services.]  Education Interventions   Education Provided Provided Education  [Advised to notify provider of any concerns or changes in patients status. Contact nephrologist for issues concerning dialysis treatment and for elevated or low blood pressures during/ after dialysis. continue to monitor blood pressures closely and record]  Provided Verbal Education On Nutrition  [assessed patients appetite.]  Pharmacy Interventions   Pharmacy Dicussed/Reviewed Pharmacy Topics Reviewed  [medications reviewed and compliance discussed and advised.]  Safety Interventions   Safety Discussed/Reviewed Fall Risk  [falls assessed. Fall prevention discussed. Agreed with daughter that camera's in the home and bed/ chair alarm would be beneficial for fall safety concerns.]                Our next appointment is by telephone on 10/13/23 at 11 am  Please call  the care guide team at [phone] if you need to cancel or reschedule your appointment.   If you are experiencing a Mental Health or Behavioral Health Crisis or need someone to talk to, please call the Suicide and Crisis Lifeline: 988 call 1-800-273-TALK (toll free, 24 hour hotline)  Patient verbalizes understanding of instructions and care plan provided today and agrees to view in MyChart. Active MyChart status and patient understanding of how to access instructions and care plan via MyChart confirmed with patient.     George Ina RN, BSN, CCM [employer], Population Health Case Manager Phone: [phone]

## 2023-10-02 ENCOUNTER — Telehealth: Payer: Self-pay

## 2023-10-02 NOTE — Telephone Encounter (Signed)
 Copied from CRM [id number]. Topic: Clinical - Medical Advice >> Sep 29, 2023 10:34 AM Chuck Crater wrote: Reason for CRM: Patient had a stroke in December and it caused damage to her right eye. She can't see out of it. Bonnell Butcher states that now patient left eye is bothering her and they are needing suggestions on what to do. Diabetic eye doctor can't assist.

## 2023-10-02 NOTE — Telephone Encounter (Signed)
 Please instruct her to call her neurologist- Dr Janett Medin about this since it was caused by the stroke  Unsure if anything can be done Thanks  Keep me posted

## 2023-10-03 NOTE — Telephone Encounter (Signed)
Daughter notified of Dr. [name]'s comments  

## 2023-10-07 ENCOUNTER — Encounter: Payer: Self-pay | Admitting: Neurology

## 2023-10-16 ENCOUNTER — Encounter: Payer: Self-pay | Admitting: Family Medicine

## 2023-10-17 ENCOUNTER — Encounter: Payer: Self-pay | Admitting: Neurology

## 2023-10-20 ENCOUNTER — Telehealth: Payer: Self-pay

## 2023-10-20 NOTE — Telephone Encounter (Signed)
Please ok those verbal orders  

## 2023-10-20 NOTE — Telephone Encounter (Signed)
 Copied from CRM [id number]. Topic: Clinical - Home Health Verbal Orders >> Oct 19, 2023  4:59 PM Keitha Pata L wrote: Caller/Agency: Leanord Prose Number: [phone] Service Requested: Occupational/physical & speech Therapy Frequency: pt 2 weeks and 1 week 7, Occ 1 week 9, st 1 week 4 Any new concerns about the patient? No

## 2023-10-20 NOTE — Telephone Encounter (Signed)
 Left VM giving Bridgette Campus the VO

## 2023-11-09 ENCOUNTER — Telehealth: Payer: Self-pay

## 2023-11-09 NOTE — Progress Notes (Signed)
 Complex Care Management Care Guide Note  11/09/2023 Name: [name] MRN: [id number] DOB: 1955/06/17  Jennifer [name] is a 68 y.o. year old female who is a primary care patient of [name], Manley Seeds, MD and is actively engaged with the care management team. I reached out to [name] by phone today to assist with re-scheduling  with the RN Case Manager.  Follow up plan: Unsuccessful telephone outreach attempt made. A HIPAA compliant phone message was left for the patient providing contact information and requesting a return call.  Lenton Rail , RMA     [facility]  [facility], [facility] Guide  Direct Dial : [phone]  Website: [email or web address]

## 2023-11-15 DIAGNOSIS — E440 Moderate protein-calorie malnutrition: Secondary | ICD-10-CM | POA: Insufficient documentation

## 2023-11-17 ENCOUNTER — Ambulatory Visit: Payer: Medicare Other | Admitting: Internal Medicine

## 2023-11-19 ENCOUNTER — Encounter: Payer: Self-pay | Admitting: Family Medicine

## 2023-11-20 ENCOUNTER — Encounter (HOSPITAL_COMMUNITY): Payer: Self-pay | Admitting: Family Medicine

## 2023-11-20 ENCOUNTER — Encounter: Payer: Self-pay | Admitting: Neurology

## 2023-11-20 ENCOUNTER — Emergency Department (HOSPITAL_COMMUNITY)

## 2023-11-20 ENCOUNTER — Inpatient Hospital Stay (HOSPITAL_COMMUNITY)
Admission: EM | Admit: 2023-11-20 | Discharge: 2023-11-26 | DRG: 689 | Disposition: A | Discharge status: Skilled Nursing Facility | Attending: Internal Medicine | Admitting: Internal Medicine

## 2023-11-20 ENCOUNTER — Other Ambulatory Visit: Payer: Self-pay

## 2023-11-20 DIAGNOSIS — N390 Urinary tract infection, site not specified: Principal | ICD-10-CM | POA: Diagnosis present

## 2023-11-20 DIAGNOSIS — Z87891 Personal history of nicotine dependence: Secondary | ICD-10-CM

## 2023-11-20 DIAGNOSIS — E1122 Type 2 diabetes mellitus with diabetic chronic kidney disease: Secondary | ICD-10-CM | POA: Diagnosis present

## 2023-11-20 DIAGNOSIS — E66811 Obesity, class 1: Secondary | ICD-10-CM | POA: Diagnosis present

## 2023-11-20 DIAGNOSIS — Z7951 Long term (current) use of inhaled steroids: Secondary | ICD-10-CM

## 2023-11-20 DIAGNOSIS — Z823 Family history of stroke: Secondary | ICD-10-CM

## 2023-11-20 DIAGNOSIS — Z8673 Personal history of transient ischemic attack (TIA), and cerebral infarction without residual deficits: Secondary | ICD-10-CM

## 2023-11-20 DIAGNOSIS — N186 End stage renal disease: Secondary | ICD-10-CM | POA: Diagnosis not present

## 2023-11-20 DIAGNOSIS — I2510 Atherosclerotic heart disease of native coronary artery without angina pectoris: Secondary | ICD-10-CM | POA: Diagnosis present

## 2023-11-20 DIAGNOSIS — D631 Anemia in chronic kidney disease: Secondary | ICD-10-CM | POA: Diagnosis present

## 2023-11-20 DIAGNOSIS — Z794 Long term (current) use of insulin: Secondary | ICD-10-CM

## 2023-11-20 DIAGNOSIS — Z79890 Hormone replacement therapy (postmenopausal): Secondary | ICD-10-CM

## 2023-11-20 DIAGNOSIS — R29810 Facial weakness: Secondary | ICD-10-CM | POA: Diagnosis present

## 2023-11-20 DIAGNOSIS — E871 Hypo-osmolality and hyponatremia: Secondary | ICD-10-CM | POA: Diagnosis present

## 2023-11-20 DIAGNOSIS — Z955 Presence of coronary angioplasty implant and graft: Secondary | ICD-10-CM

## 2023-11-20 DIAGNOSIS — J449 Chronic obstructive pulmonary disease, unspecified: Secondary | ICD-10-CM | POA: Diagnosis present

## 2023-11-20 DIAGNOSIS — Z96651 Presence of right artificial knee joint: Secondary | ICD-10-CM | POA: Diagnosis present

## 2023-11-20 DIAGNOSIS — E039 Hypothyroidism, unspecified: Secondary | ICD-10-CM | POA: Diagnosis present

## 2023-11-20 DIAGNOSIS — I639 Cerebral infarction, unspecified: Principal | ICD-10-CM

## 2023-11-20 DIAGNOSIS — B964 Proteus (mirabilis) (morganii) as the cause of diseases classified elsewhere: Secondary | ICD-10-CM | POA: Diagnosis present

## 2023-11-20 DIAGNOSIS — R531 Weakness: Secondary | ICD-10-CM

## 2023-11-20 DIAGNOSIS — Z6832 Body mass index (BMI) 32.0-32.9, adult: Secondary | ICD-10-CM

## 2023-11-20 DIAGNOSIS — G9341 Metabolic encephalopathy: Secondary | ICD-10-CM | POA: Diagnosis present

## 2023-11-20 DIAGNOSIS — Z888 Allergy status to other drugs, medicaments and biological substances status: Secondary | ICD-10-CM

## 2023-11-20 DIAGNOSIS — Z992 Dependence on renal dialysis: Secondary | ICD-10-CM

## 2023-11-20 DIAGNOSIS — F0393 Unspecified dementia, unspecified severity, with mood disturbance: Secondary | ICD-10-CM | POA: Diagnosis present

## 2023-11-20 DIAGNOSIS — Z5982 Transportation insecurity: Secondary | ICD-10-CM

## 2023-11-20 DIAGNOSIS — N2581 Secondary hyperparathyroidism of renal origin: Secondary | ICD-10-CM | POA: Diagnosis present

## 2023-11-20 DIAGNOSIS — Z79899 Other long term (current) drug therapy: Secondary | ICD-10-CM

## 2023-11-20 DIAGNOSIS — Z7982 Long term (current) use of aspirin: Secondary | ICD-10-CM

## 2023-11-20 DIAGNOSIS — B9620 Unspecified Escherichia coli [E. coli] as the cause of diseases classified elsewhere: Secondary | ICD-10-CM | POA: Diagnosis present

## 2023-11-20 DIAGNOSIS — I120 Hypertensive chronic kidney disease with stage 5 chronic kidney disease or end stage renal disease: Secondary | ICD-10-CM | POA: Diagnosis present

## 2023-11-20 DIAGNOSIS — E7800 Pure hypercholesterolemia, unspecified: Secondary | ICD-10-CM | POA: Diagnosis present

## 2023-11-20 DIAGNOSIS — R4781 Slurred speech: Secondary | ICD-10-CM | POA: Diagnosis present

## 2023-11-20 DIAGNOSIS — F0394 Unspecified dementia, unspecified severity, with anxiety: Secondary | ICD-10-CM | POA: Diagnosis present

## 2023-11-20 DIAGNOSIS — F32A Depression, unspecified: Secondary | ICD-10-CM | POA: Diagnosis present

## 2023-11-20 DIAGNOSIS — Z8249 Family history of ischemic heart disease and other diseases of the circulatory system: Secondary | ICD-10-CM

## 2023-11-20 LAB — RAPID URINE DRUG SCREEN, HOSP PERFORMED
Amphetamines: NOT DETECTED
Barbiturates: NOT DETECTED
Benzodiazepines: NOT DETECTED
Cocaine: NOT DETECTED
Opiates: NOT DETECTED
Tetrahydrocannabinol: NOT DETECTED

## 2023-11-20 LAB — CBC
HCT: 31.5 % — ABNORMAL LOW (ref 36.0–46.0)
Hemoglobin: 10.2 g/dL — ABNORMAL LOW (ref 12.0–15.0)
MCH: 31.6 pg (ref 26.0–34.0)
MCHC: 32.4 g/dL (ref 30.0–36.0)
MCV: 97.5 fL (ref 80.0–100.0)
Platelets: 239 10*3/uL (ref 150–400)
RBC: 3.23 MIL/uL — ABNORMAL LOW (ref 3.87–5.11)
RDW: 16 % — ABNORMAL HIGH (ref 11.5–15.5)
WBC: 8.6 10*3/uL (ref 4.0–10.5)
nRBC: 0 % (ref 0.0–0.2)

## 2023-11-20 LAB — COMPREHENSIVE METABOLIC PANEL WITH GFR
ALT: 13 U/L (ref 0–44)
AST: 18 U/L (ref 15–41)
Albumin: 2.3 g/dL — ABNORMAL LOW (ref 3.5–5.0)
Alkaline Phosphatase: 65 U/L (ref 38–126)
Anion gap: 13 (ref 5–15)
BUN: 60 mg/dL — ABNORMAL HIGH (ref 8–23)
CO2: 25 mmol/L (ref 22–32)
Calcium: 8 mg/dL — ABNORMAL LOW (ref 8.9–10.3)
Chloride: 93 mmol/L — ABNORMAL LOW (ref 98–111)
Creatinine, Ser: 5.71 mg/dL — ABNORMAL HIGH (ref 0.44–1.00)
GFR, Estimated: 8 mL/min — ABNORMAL LOW (ref >60)
Glucose, Bld: 207 mg/dL — ABNORMAL HIGH (ref 70–99)
Potassium: 4.2 mmol/L (ref 3.5–5.1)
Sodium: 131 mmol/L — ABNORMAL LOW (ref 135–145)
Total Bilirubin: 0.8 mg/dL (ref 0.0–1.2)
Total Protein: 4.9 g/dL — ABNORMAL LOW (ref 6.5–8.1)

## 2023-11-20 LAB — I-STAT CHEM 8, ED
BUN: 59 mg/dL — ABNORMAL HIGH (ref 8–23)
Calcium, Ion: 0.92 mmol/L — ABNORMAL LOW (ref 1.15–1.40)
Chloride: 94 mmol/L — ABNORMAL LOW (ref 98–111)
Creatinine, Ser: 5.9 mg/dL — ABNORMAL HIGH (ref 0.44–1.00)
Glucose, Bld: 202 mg/dL — ABNORMAL HIGH (ref 70–99)
HCT: 29 % — ABNORMAL LOW (ref 36.0–46.0)
Hemoglobin: 9.9 g/dL — ABNORMAL LOW (ref 12.0–15.0)
Potassium: 3.9 mmol/L (ref 3.5–5.1)
Sodium: 130 mmol/L — ABNORMAL LOW (ref 135–145)
TCO2: 24 mmol/L (ref 22–32)

## 2023-11-20 LAB — DIFFERENTIAL
Abs Immature Granulocytes: 0.09 10*3/uL — ABNORMAL HIGH (ref 0.00–0.07)
Basophils Absolute: 0.1 10*3/uL (ref 0.0–0.1)
Basophils Relative: 1 %
Eosinophils Absolute: 0 10*3/uL (ref 0.0–0.5)
Eosinophils Relative: 1 %
Immature Granulocytes: 1 %
Lymphocytes Relative: 12 %
Lymphs Abs: 1 10*3/uL (ref 0.7–4.0)
Monocytes Absolute: 0.8 10*3/uL (ref 0.1–1.0)
Monocytes Relative: 10 %
Neutro Abs: 6.5 10*3/uL (ref 1.7–7.7)
Neutrophils Relative %: 75 %

## 2023-11-20 LAB — APTT: aPTT: 40 s — ABNORMAL HIGH (ref 24–36)

## 2023-11-20 LAB — PROTIME-INR
INR: 1.2 (ref 0.8–1.2)
Prothrombin Time: 15.7 s — ABNORMAL HIGH (ref 11.4–15.2)

## 2023-11-20 LAB — ETHANOL: Alcohol, Ethyl (B): <15 mg/dL (ref <15)

## 2023-11-20 LAB — CBG MONITORING, ED: Glucose-Capillary: 204 mg/dL — ABNORMAL HIGH (ref 70–99)

## 2023-11-20 MED ORDER — SEVELAMER CARBONATE 0.8 G PO PACK
0.8000 g | PACK | Freq: Three times a day (TID) | ORAL | Status: DC
  Administered 2023-11-22 – 2023-11-25 (×10): 0.8 g via ORAL
  Filled 2023-11-20 (×20): qty 1

## 2023-11-20 MED ORDER — INSULIN GLARGINE-YFGN 100 UNIT/ML ~~LOC~~ SOLN
25.0000 [IU] | Freq: Every day | SUBCUTANEOUS | Status: DC
  Administered 2023-11-21: 25 [IU] via SUBCUTANEOUS
  Filled 2023-11-20: qty 0.25

## 2023-11-20 MED ORDER — STROKE: EARLY STAGES OF RECOVERY BOOK
Freq: Once | Status: DC
  Filled 2023-11-20: qty 1

## 2023-11-20 MED ORDER — ASPIRIN 300 MG RE SUPP
300.0000 mg | Freq: Once | RECTAL | Status: AC
  Administered 2023-11-20: 300 mg via RECTAL
  Filled 2023-11-20: qty 1

## 2023-11-20 MED ORDER — DULOXETINE HCL 30 MG PO CPEP: 60.0000 mg | ORAL_CAPSULE | Freq: Every day | ORAL | Status: DC

## 2023-11-20 MED ORDER — ASPIRIN 81 MG PO CHEW
81.0000 mg | CHEWABLE_TABLET | Freq: Every day | ORAL | Status: DC
  Administered 2023-11-22 – 2023-11-26 (×5): 81 mg via ORAL
  Filled 2023-11-20 (×5): qty 1

## 2023-11-20 MED ORDER — ACETAMINOPHEN 650 MG RE SUPP: 650.0000 mg | RECTAL | Status: DC | PRN

## 2023-11-20 MED ORDER — TOBRAMYCIN 0.3 % OP SOLN
1.0000 [drp] | OPHTHALMIC | Status: DC
  Administered 2023-11-21 – 2023-11-26 (×55): 1 [drp] via OPHTHALMIC
  Filled 2023-11-20: qty 5

## 2023-11-20 MED ORDER — LEVOTHYROXINE SODIUM 75 MCG PO TABS
175.0000 ug | ORAL_TABLET | Freq: Every day | ORAL | Status: DC
  Administered 2023-11-22 – 2023-11-26 (×5): 175 ug via ORAL
  Filled 2023-11-20 (×5): qty 1

## 2023-11-20 MED ORDER — ACETAMINOPHEN 160 MG/5ML PO SOLN: 650.0000 mg | ORAL | Status: DC | PRN

## 2023-11-20 MED ORDER — ATORVASTATIN CALCIUM 40 MG PO TABS
80.0000 mg | ORAL_TABLET | Freq: Every day | ORAL | Status: DC
  Administered 2023-11-22 – 2023-11-25 (×5): 80 mg via ORAL
  Filled 2023-11-20 (×6): qty 2

## 2023-11-20 MED ORDER — DOXYCYCLINE HYCLATE 100 MG PO TABS: 100.0000 mg | ORAL_TABLET | Freq: Two times a day (BID) | ORAL | Status: DC

## 2023-11-20 MED ORDER — BUPROPION HCL ER (SR) 150 MG PO TB12: 150.0000 mg | ORAL_TABLET | Freq: Two times a day (BID) | ORAL | Status: DC

## 2023-11-20 MED ORDER — IOHEXOL 350 MG/ML SOLN
75.0000 mL | Freq: Once | INTRAVENOUS | Status: AC | PRN
Start: 2023-11-20 — End: 2023-11-20
  Administered 2023-11-20: 75 mL via INTRAVENOUS

## 2023-11-20 MED ORDER — ACETAMINOPHEN 325 MG PO TABS: 650.0000 mg | ORAL_TABLET | ORAL | Status: DC | PRN

## 2023-11-20 MED ORDER — MEMANTINE HCL 10 MG PO TABS: 10.0000 mg | ORAL_TABLET | Freq: Two times a day (BID) | ORAL | Status: DC

## 2023-11-20 MED ORDER — HEPARIN SODIUM (PORCINE) 5000 UNIT/ML IJ SOLN
5000.0000 [IU] | Freq: Three times a day (TID) | INTRAMUSCULAR | Status: DC
  Administered 2023-11-21 – 2023-11-26 (×14): 5000 [IU] via SUBCUTANEOUS
  Filled 2023-11-20 (×14): qty 1

## 2023-11-20 MED ORDER — INSULIN ASPART 100 UNIT/ML IJ SOLN
0.0000 [IU] | INTRAMUSCULAR | Status: DC
  Administered 2023-11-21 – 2023-11-22 (×3): 1 [IU] via SUBCUTANEOUS
  Administered 2023-11-24: 2 [IU] via SUBCUTANEOUS
  Filled 2023-11-20: qty 1

## 2023-11-20 NOTE — ED Notes (Signed)
 Patient transported to CT

## 2023-11-20 NOTE — Consult Note (Signed)
 Code stroke activated at 2127 by EMS pre elert for L side weakness and slurred speech.   [facility] paged at 2140.  Left for CT at 2141.  Dr Lorita Rosa on camera for neuro exam at 2143 while in CT.   mRS 4. Reported hx of ICH after TNK in December.   Off cart at 2156 and monitoring advanced imaging. Neuro to f/u with EDP by phone.   Ruven Coy, telestroke RN

## 2023-11-20 NOTE — ED Notes (Signed)
 CODE STROKE PAGED @ 2120 UPON EMS ENCODE. REPAGED 2134 UPON EMS ARRIVAL

## 2023-11-20 NOTE — Consult Note (Signed)
 Tele Stroke Consult Note    [facility] Tele[facility]urology Consult Services   Patient Name:   Jennifer [name], Jennifer [name] Date of Birth:   1955/09/05 Identification Number:   MRN - [id number] Date of Service:   11/20/2023 21:40:00  Diagnosis:       I63.89 - Cerebrovascular accident (CVA) due to other mechanism ([facility])       G93.41 - Encephalopathy Metabolic       R53.1 - Weakness       R29.810 - Facial numbness/ Facial weakness  Impression:      altered mental status and left-sided weakness; possible acute ischemic stroke vs acute toxic or metabolic or infectious encephalopathy vs seizure/postictal    load with aspirin  300 mg PR x1 as patient cannot swallow safely due to altered mental status  will need to clarify home medications (and clarify if there have been any new medications or dosing changes)  limit sedating medications  allow permissive hypertension up to 220/110 for first 24 hours then slowly and gradually goal normotension thereafter  toxic-metabolic-infectious workup (including EKG, troponins, B12 level, TSH with free T4, U/A and Ucx-if patient is not anuric)  admit for further workup and management  MRI brain when able  EEG to eval for asymmetry or interictal activity  continue to monitor on telemetry  echo  labs for risk factor stratification (lipid panel and HbA1c)  PT/OT/speech    Plan discussed with ED team, including ED physician, who are all in agreement with plan. All questions were answered to the best of my ability.  Sign Out:       Discussed with Emergency Department Provider    ------------------------------------------------------------------------------  Metrics: Last Known Well: 11/20/2023 17:00:00 Dispatch Time: 11/20/2023 21:40:00 Arrival Time: 11/20/2023 21:32:00 Initial Response Time: 11/20/2023 21:43:05 Symptoms: altered mental status and left-sided weakness. Initial patient interaction: 11/20/2023 21:44:23 NIHSS Assessment Completed:  11/20/2023 21:51:59 Patient is not a candidate for Thrombolytic. Thrombolytic Medical Decision: 11/20/2023 21:48:36 Patient was not deemed candidate for Thrombolytic because of following reasons: LKW outside 4.5 hr window. . History of previous intracranial hemorrhage, intracranial neoplasm .  CT Head: I personally reviewed all the CT images that were available to me and it showed: Imaging was personally reviewed. CTH shows diffuse atrophy and small vessel ischemic disease but is negative for bleed or early ischemic changes. CTA head/neck shows scattered atherosclerotic plaques but vessels are patent without high grade stenosis or large vessel occlusion.  Primary Provider Notified of Diagnostic Impression and Management Plan on: 11/20/2023 22:01:50    ------------------------------------------------------------------------------  History of Present Illness: Patient is a 68 year old Female.  Patient was brought by EMS for symptoms of altered mental status and left-sided weakness. 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 LKW 1700. patient was then noted to be confused with slow, slurred speech, and left-sided weakness.  At the time of my evaluation, patient has a right facial droop with LEFT upper extremity weakness and bilateral lower extremity weakness.  of note she presented in 05/2023 with similar exam. at that time she stopped responding and was not speaking or following commands with a right facial droop and left arm weakness. she received Tenecteplase  at that time, which was unfortunately complicated by  hemorrhage  of the right middle cerebellar peduncle. MRI brain also showed a punctate left hemispheric acute ischemic stroke at that time as well.    Past Medical History:      Hypertension      Diabetes Mellitus      Hyperlipidemia      Stroke       Dementia/MCI Other PMH:  end stage renal disease on hemodialysis unable to obtain due to:   Patient Cannot Speak  Medications:  No Anticoagulant use  Antiplatelet use: Yes aspirin  81 mg daily Reviewed EMR for current medications Other Medications Pertinent To Assessment Include: memantine  10 mg twice a day  Allergies:  Reviewed Allergies Unable To Obtain Due To: Patient Cannot Speak  Social History: Unable To Obtain Due To Patient Status : Patient Cannot Speak  Family History:  Family History Cannot Be Obtained Because:Patient Cannot Speak  ROS : ROS Cannot Be Obtained Because:  Patient Cannot Speak  Past Surgical History: Past Surgical History Cannot Be Obtained Because: Patient Cannot Speak There Is No Surgical History Contributory To Today's Visit     Examination: BP(133/57), Pulse(81), Blood Glucose(204) 1A: Level of Consciousness - Arouses to minor stimulation + 1 1B: Ask Month and Age - Could Not Answer Either Question Correctly + 2 1C: Blink Eyes & Squeeze Hands - Performs 0 Tasks + 2 2: Test Horizontal Extraocular Movements - Normal + 0 3: Test Visual Fields - Complete Hemianopia + 2 4: Test Facial Palsy (Use Grimace if Obtunded) - Partial paralysis (lower face) + 2 5A: Test Left Arm Motor Drift - Some Effort Against Gravity + 2 5B: Test Right Arm Motor Drift - Drift, but doesn't hit bed + 1 6A: Test Left Leg Motor Drift - No Effort Against Gravity + 3 6B: Test Right Leg Motor Drift - No Effort Against Gravity + 3 7: Test Limb Ataxia (FNF/Heel-Shin) - Does Not Understand + 0 8: Test Sensation - Mild-Moderate Loss: Can Sense Being Touched + 1 9: Test Language/Aphasia - Severe Aphasia: Fragmentary Expression, Inference  Needed, Cannot Identify Materials + 2 10: Test Dysarthria - Mute/Anarthric + 2 11: Test Extinction/Inattention - No abnormality + 0  NIHSS Score: 23  NIHSS Free Text : Patient is encephalopathic appearing with increased latency to respond. essentially nonverbal but will occasionally nod in response to simple yes/no questions. unable to answer orientation questions. Able to follow some simple commands with prompting/mimic. RIGHT facial droop. Eyes conjugate on primary gaze. Full EOM in the horizontal plane in both directions. no blink to threat on the right. +blink to threat on the left. Aware of being touched and responds to noxious on the right upper extremity and bilateral lower extremities. decreased response to noxious testing on the left upper extremity. Antigravity in the right >left upper extremities. unable to maintain the bilateral lower extremities antigravity but able to wiggle toes to commands bilaterally. UTA FTN/TS 2/2 mental status.  Pre-Morbid Modified Rankin Scale: Unable to assess  Spoke with : Dr. Annabell Key I reviewed the available imaging via Rapid and initiated discussion with the primary provider  This consult was conducted in real time using interactive audio and video technology. Patient was informed of the technology being used for this visit and agreed to proceed. Patient located in hospital and provider located at home/office setting.   Patient is being evaluated for possible acute neurologic impairment and high probability of imminent or life-threatening deterioration. I spent total of 46 minutes providing care to this patient, including time for  face to face visit via telemedicine, review of medical records, imaging studies and discussion of findings with providers, the patient and/or family.   Dr Parthenia Bolt   [facility] For Inpatient follow-up with [facility] physician please call [facility] at [phone]. As we are not an outpatient service for any  post hospital discharge needs please contact the hospital for assistance. If you have any questions for the [facility] physicians or need to reconsult for clinical or diagnostic changes please contact us  via [facility] at [phone].

## 2023-11-20 NOTE — ED Notes (Signed)
 ED Provider at bedside.

## 2023-11-20 NOTE — ED Triage Notes (Signed)
 Pt bib [facility] from home, per EMS family reports pt has left sided weakness and slurred speech. LKW was around 1700 this afternoon. Pt does have right sided facial droop from previous stroke last year.    CBG PTA was 164

## 2023-11-20 NOTE — H&P (Signed)
 History and Physical    Jennifer [name] KGM:[id number] DOB: 1956/04/22 DOA: 11/20/2023  PCP: Clemens Curt, MD   Patient coming from: Home   Chief Complaint: Left-sided weakness, speech difficulty   HPI: Jennifer [name] is a 68 y.o. female with medical history significant for hypertension, type 2 diabetes mellitus, hypothyroidism, ESRD on hemodialysis, CAD, depression, anxiety, memory loss, and history of CVA with residual deficits who presents with acute onset of left-sided weakness and speech difficulty.  Patient fell today and when her daughter was trying to assist her back up, she noted that the patient had new left-sided weakness, confusion, and difficulty with her speech.  She had been in her usual state at approximately 5 PM.  ED Course: Upon arrival to the ED, patient is found to be afebrile and saturating mid 90s on room air with normal HR and stable BP.  Labs are most notable for BUN 60, glucose 207, albumin 2.3, normal WBC, and hemoglobin 10.2.  There are no acute intracranial abnormalities on head CT and no LVO or other emergent finding on CTA.  Patient was evaluated by teleneurology in the emergency department and recommendations for further workup and management was given.  She was given 300 mg aspirin  rectally.  Review of Systems:  ROS limited by patient's clinical condition.  Past Medical History:  Diagnosis Date   CAD (coronary artery disease)    cath 5/23 100% dist RCA lesion treated with 2 overlapping Integrity Resolute DES ( 2.25x36mm, 2.25x65mm), 60% mid RCA treated medically, 99% OM2 not amenable to PCI, 70% D1 lesion, EF normal   Hypercholesteremia    Hypertension    Hypothyroidism    Kidney disease    Renal disorder    Stroke (HCC) 10/2018   Thyroid  disease    Type 2 diabetes mellitus (HCC) 10/08/2018    Past Surgical History:  Procedure Laterality Date   ABDOMINAL HYSTERECTOMY     AV FISTULA PLACEMENT Right 06/28/2022   Procedure: RIGHT ARM ARTERIOVENOUS  (AV) FISTULA CREATION;  Surgeon: Mayo Speck, MD;  Location: [facility];  Service: Vascular;  Laterality: Right;   CARDIAC CATHETERIZATION N/A 11/03/2015   Procedure: Left Heart Cath and Coronary Angiography;  Surgeon: Arleen Lacer, MD;  Location: [facility] INVASIVE CV LAB;  Service: Cardiovascular;  Laterality: N/A;   CARDIAC CATHETERIZATION N/A 11/03/2015   Procedure: Coronary Stent Intervention;  Surgeon: Arleen Lacer, MD;  Location: [facility] INVASIVE CV LAB;  Service: Cardiovascular;  Laterality: N/A;   CARPAL TUNNEL RELEASE     CORONARY ANGIOPLASTY     1 stent   FISTULA SUPERFICIALIZATION Right 08/16/2022   Procedure: RIGHT ARTERIOVENOUS FISTULA SUPERFICIALIZATION;  Surgeon: Mayo Speck, MD;  Location: [facility];  Service: Vascular;  Laterality: Right;   TOTAL KNEE ARTHROPLASTY Right 04/02/2019   Procedure: TOTAL KNEE ARTHROPLASTY;  Surgeon: Darrin Emerald, MD;  Location: [facility];  Service: Orthopedics;  Laterality: Right;    Social History:   reports that she quit smoking about 2 years ago. Her smoking use included cigarettes. She has never used smokeless tobacco. She reports that she does not drink alcohol  and does not use drugs.  Allergies  Allergen Reactions   Metformin Diarrhea   Tenecteplase  Other (See Comments)    Unknown     Family History  Problem Relation Age of Onset   Heart disease Mother    Stroke Sister    Heart attack Brother    Breast cancer Neg Hx      Prior  to Admission medications   Medication Sig Start Date End Date Taking? Authorizing Provider  aspirin  81 MG chewable tablet Chew 1 tablet (81 mg total) by mouth daily. 05/24/23  Yes de Thayne Fine, Cortney E, NP  atorvastatin  (LIPITOR ) 80 MG tablet TAKE 1 TABLET BY MOUTH ONCE DAILY AT  6  IN  THE  EVENING 09/06/23  Yes Mallipeddi, Vishnu P, MD  buPROPion  (WELLBUTRIN  SR) 150 MG 12 hr tablet Take 1 tablet by mouth twice daily 08/02/23  Yes [name], Manley Seeds, MD  carvedilol  (COREG ) 12.5 MG tablet Take 1 tablet (12.5 mg total)  by mouth daily. On non-dialysis days Patient taking differently: Take 12.5 mg by mouth at bedtime. On non-dialysis days 05/24/23  Yes de Thayne Fine, Cortney E, NP  doxycycline  (MONODOX ) 100 MG capsule Take 100 mg by mouth 2 (two) times daily. For a month for eye infection   Yes [provider]  DULoxetine  (CYMBALTA ) 60 MG capsule Take 1 capsule by mouth once daily 07/19/23  Yes [name], Manley Seeds, MD  ferrous sulfate  325 (65 FE) MG tablet Take 325 mg by mouth as needed (given when needed at dialysis).   Yes [provider]  furosemide  (LASIX ) 80 MG tablet Take 80 mg by mouth daily. On non-dialysis days 10/27/22  Yes [provider]  insulin  lispro protamine-lispro (HUMALOG  75/25 MIX) (75-25) 100 UNIT/ML SUSP injection Inject 10-25 Units into the skin 3 (three) times daily as needed (high bs). Inject 25 units with breakfast, 10 units at lunch, 25 units at dinner   Yes [provider]  iron sucrose 100 mg in sodium chloride  0.9 % 100 mL Inject 100 mg into the vein as needed (iron-deficiency anemia). Venofer   Yes [provider]  levothyroxine  (SYNTHROID ) 175 MCG tablet Take 175 mcg by mouth daily before breakfast.   Yes [provider]  lidocaine -prilocaine  (EMLA ) cream Apply 1 Application topically 4 (four) times a week.   Yes [provider]  memantine  (NAMENDA ) 10 MG tablet Take 1 tablet (10 mg total) by mouth 2 (two) times daily. 06/22/23  Yes Sethi, Pramod S, MD  Methoxy PEG-Epoetin Beta (MIRCERA) 200 MCG/0.3ML SOSY Inject 200 mcg as directed as needed (low RBCs). Mircera   Yes [provider]  nitroGLYCERIN  (NITROSTAT ) 0.4 MG SL tablet Place 0.4 mg under the tongue every 5 (five) minutes as needed for chest pain.   Yes [provider]  sevelamer carbonate (RENVELA) 0.8 g PACK packet Take 0.8 g by mouth 3 (three) times daily with meals.   Yes [provider]  tobramycin (TOBREX) 0.3 % ophthalmic solution Place 1 drop  into the right eye every 2 (two) hours.   Yes [provider]  BD PEN NEEDLE NANO 2ND GEN 32G X 4 MM MISC Inject into the skin 3 (three) times daily. 07/22/21   [provider]    Physical Exam: Vitals:   11/20/23 2210 11/20/23 2230 11/20/23 2300 11/20/23 2315  BP: 127/72 (!) 131/55 (!) 135/55 (!) 129/54  Pulse:  91 88   Resp: 15 15 15 16   Temp:      TempSrc:      SpO2:  94% 93%   Weight:      Height:         Constitutional: NAD, no pallor or diaphoresis   Eyes: PERTLA, lids and conjunctivae normal ENMT: Mucous membranes are moist. Posterior pharynx clear of any exudate or lesions.   Neck: supple, no masses  Respiratory: no wheezing, no  crackles. No accessory muscle use.  Cardiovascular: S1 & S2 heard, regular rate and rhythm. No JVD. Abdomen: no tenderness, soft. Bowel sounds active.  Musculoskeletal: no clubbing / cyanosis. No joint deformity upper and lower extremities.   Skin: no significant rashes, lesions, ulcers. Warm, dry, well-perfused. Neurologic: Right lower facial weakness. Dysarthria. LUE strength 3-4/5. LLE strength 2/5. Alert and oriented to person and place only.  Psychiatric: Calm. Cooperative.    Labs and Imaging on Admission: I have personally reviewed following labs and imaging studies  CBC: Recent Labs  Lab 11/20/23 2141 11/20/23 2230  WBC 8.6  --   NEUTROABS 6.5  --   HGB 10.2* 9.9*  HCT 31.5* 29.0*  MCV 97.5  --   PLT 239  --    Basic Metabolic Panel: Recent Labs  Lab 11/20/23 2141 11/20/23 2230  NA 131* 130*  K 4.2 3.9  CL 93* 94*  CO2 25  --   GLUCOSE 207* 202*  BUN 60* 59*  CREATININE 5.71* 5.90*  CALCIUM  8.0*  --    GFR: Estimated Creatinine Clearance: 10.8 mL/min (A) (by C-G formula based on SCr of 5.9 mg/dL (H)). Liver Function Tests: Recent Labs  Lab 11/20/23 2141  AST 18  ALT 13  ALKPHOS 65  BILITOT 0.8  PROT 4.9*  ALBUMIN 2.3*   No results for input(s): "LIPASE", "AMYLASE" in the last 168  hours. No results for input(s): "AMMONIA" in the last 168 hours. Coagulation Profile: Recent Labs  Lab 11/20/23 2141  INR 1.2   Cardiac Enzymes: No results for input(s): "CKTOTAL", "CKMB", "CKMBINDEX", "TROPONINI" in the last 168 hours. BNP (last 3 results) No results for input(s): "PROBNP" in the last 8760 hours. HbA1C: No results for input(s): "HGBA1C" in the last 72 hours. CBG: Recent Labs  Lab 11/20/23 2138  GLUCAP 204*   Lipid Profile: No results for input(s): "CHOL", "HDL", "LDLCALC", "TRIG", "CHOLHDL", "LDLDIRECT" in the last 72 hours. Thyroid  Function Tests: No results for input(s): "TSH", "T4TOTAL", "FREET4", "T3FREE", "THYROIDAB" in the last 72 hours. Anemia Panel: No results for input(s): "VITAMINB12", "FOLATE", "FERRITIN", "TIBC", "IRON", "RETICCTPCT" in the last 72 hours. Urine analysis:    Component Value Date/Time   COLORURINE YELLOW 03/22/2022 1948   APPEARANCEUR HAZY (A) 03/22/2022 1948   LABSPEC 1.013 03/22/2022 1948   PHURINE 5.0 03/22/2022 1948   GLUCOSEU 50 (A) 03/22/2022 1948   HGBUR NEGATIVE 03/22/2022 1948   BILIRUBINUR NEGATIVE 03/22/2022 1948   BILIRUBINUR neg 11/19/2021 1015   KETONESUR 5 (A) 03/22/2022 1948   PROTEINUR >=300 (A) 03/22/2022 1948   UROBILINOGEN 0.2 11/19/2021 1015   NITRITE NEGATIVE 03/22/2022 1948   LEUKOCYTESUR NEGATIVE 03/22/2022 1948   Sepsis Labs: @LABRCNTIP (procalcitonin:4,lacticidven:4) )No results found for this or any previous visit (from the past 240 hours).   Radiological Exams on Admission: CT ANGIO HEAD NECK W WO CM (CODE STROKE) Result Date: 11/20/2023 CLINICAL DATA:  Initial evaluation for acute neuro deficit, stroke suspected. EXAM: CT ANGIOGRAPHY HEAD AND NECK WITH AND WITHOUT CONTRAST TECHNIQUE: Multidetector CT imaging of the head and neck was performed using the standard protocol during bolus administration of intravenous contrast. Multiplanar CT image reconstructions and MIPs were obtained to evaluate the  vascular anatomy. Carotid stenosis measurements (when applicable) are obtained utilizing NASCET criteria, using the distal internal carotid diameter as the denominator. RADIATION DOSE REDUCTION: This exam was performed according to the departmental dose-optimization program which includes automated exposure control, adjustment of the mA and/or kV according to patient size and/or use of iterative  reconstruction technique. CONTRAST:  75mL OMNIPAQUE  IOHEXOL  350 MG/ML SOLN COMPARISON:  Comparison made with head CT from earlier the same day as well as prior CTA from 05/20/2023. FINDINGS: CTA NECK FINDINGS Aortic arch: Visualized aortic arch within normal limits for caliber with standard 3 vessel morphology. Aortic atherosclerosis. No significant stenosis about the origin the great vessels. Right carotid system: Right common and internal carotid arteries are patent without dissection. Mild atheromatous change about the right carotid bulb without hemodynamically significant greater than 50% stenosis. Left carotid system: Left common and internal carotid arteries are patent without dissection. Mild atheromatous change about the left carotid bulb without hemodynamically significant stenosis. Vertebral arteries: Both vertebral arteries arise from subclavian arteries. Vertebral arteries are patent without stenosis or dissection. Skeleton: No worrisome osseous lesions. Other neck: No other acute finding. Upper chest: No other acute finding. Review of the MIP images confirms the above findings CTA HEAD FINDINGS Anterior circulation: Atheromatous change about the carotid siphons with associated mild to moderate multifocal narrowing bilaterally. A1 segments patent bilaterally. Normal anterior communicating artery complex. Anterior cerebral arteries patent without significant stenosis. No M1 stenosis or occlusion. Distal MCA branches perfused and symmetric. Posterior circulation: Both V4 segments patent without stenosis. Both PICA  patent at their origins. Basilar patent without stenosis. Superior cerebral arteries patent bilaterally. Both PCAs primarily supplied via the basilar. Atheromatous irregularity about the PCAs bilaterally the with associated mild to moderate bilateral P2 stenoses. PCAs remain patent to their distal aspects. Venous sinuses: Patent allowing for timing the contrast bolus. Anatomic variants: None significant.  No aneurysm. Review of the MIP images confirms the above findings IMPRESSION: 1. Negative CTA for large vessel occlusion or other emergent finding. 2. Intracranial atherosclerotic disease, most notably about the carotid siphons and PCAs where there are associated mild to moderate multifocal stenoses. 3. Mild atheromatous change about the carotid bifurcations without significant stenosis. No hemodynamically significant stenosis within the neck. 4. Aortic Atherosclerosis (ICD10-I70.0). Results were called by telephone at the time of interpretation on 11/20/2023 at 10:18 pm to provider [facility] , who verbally acknowledged these results. Electronically Signed   By: Virgia Griffins M.D.   On: 11/20/2023 22:19   CT HEAD CODE STROKE WO CONTRAST Result Date: 11/20/2023 CLINICAL DATA:  Code stroke. Initial evaluation for acute neuro deficit, stroke suspected. EXAM: CT HEAD WITHOUT CONTRAST TECHNIQUE: Contiguous axial images were obtained from the base of the skull through the vertex without intravenous contrast. RADIATION DOSE REDUCTION: This exam was performed according to the departmental dose-optimization program which includes automated exposure control, adjustment of the mA and/or kV according to patient size and/or use of iterative reconstruction technique. COMPARISON:  Prior study from 05/20/2023 FINDINGS: Brain: Generalized age-related cerebral atrophy. Patchy hypodensity involving the supratentorial cerebral white matter, consistent chronic small vessel ischemic disease, advanced in nature. Remote right  basal ganglia lacunar infarct. Additional chronic left cerebellar infarct. No acute intracranial hemorrhage. No acute large vessel territory infarct. No mass lesion or midline shift. No hydrocephalus or extra-axial fluid collection. Vascular: No abnormal hyperdense vessel. Calcified atherosclerosis present about the skull base. Skull: Scalp soft tissues within normal limits.  Calvarium intact. Sinuses/Orbits: Globes and orbital soft tissues within normal limits. Small osteoma noted within the right ethmoidal air cells. Paranasal sinuses are largely clear. No significant mastoid effusion. Other: None. ASPECTS [facility] Stroke Program Early CT Score) - Ganglionic level infarction (caudate, lentiform nuclei, internal capsule, insula, M1-M3 cortex): 7 - Supraganglionic infarction (M4-M6 cortex): 3 Total score (0-10 with 10 being  normal): 10 IMPRESSION: 1. No acute intracranial abnormality. 2. ASPECTS is 10. 3. Atrophy with advanced chronic microvascular ischemic disease, with chronic right basal ganglia and left cerebellar infarcts. Results were called by telephone at the time of interpretation on 11/20/2023 at 9:55 pm to provider [facility] , who verbally acknowledged these results. Electronically Signed   By: Virgia Griffins M.D.   On: 11/20/2023 22:00    EKG: Independently reviewed. Sinus rhythm.   Assessment/Plan   1. Left-sided weakness and speech difficulty  - Continue cardiac monitoring and frequent neuro checks, check MRI brain, EEG, echo, lipids, A1c, TSH, free T4, B12, troponin, and UA, consult PT/OT/SLP, load with ASA, continue ASA and statin   2. ERSD  - Restrict fluids, renally-dose medications, continue phosphate binder, consult nephrology in am for maintenance dialysis    3. Depression, anxiety, memory-loss  - Use delirium precautions, hold potentially sedating medications for now    4. Type II DM  - A1c was 6.2% in December 2024  - Check CBGs, use long-acting insulin  and  sliding-scale correctional for now    5. Hypertension  - Permit HTN for now    6. Hypothyroidism  - Synthroid    7. CAD  - Continue ASA and Lipitor , hold beta-blocker for now while permitting HTN   8. Hx of CVA  - ASA, Lipitor       DVT prophylaxis: sq heparin   Code Status: Full  Level of Care: Level of care: Telemetry Family Communication: Daughter at bedside   Disposition Plan:  Patient is from: home  Anticipated d/c is to: TBD Anticipated d/c date is: Possibly as early as 6/10 or 6/11 Patient currently: Pending EEG, MRI, echo, additional labs, therapy assessments  Consults called: Teleneurology  Admission status: Observation     Walton Guppy, MD [facility]  11/20/2023, 11:37 PM

## 2023-11-20 NOTE — ED Provider Notes (Signed)
 [place] EMERGENCY DEPARTMENT AT [facility] Provider Note   CSN: [id number] Arrival date & time: 11/20/23  2132     History {Add pertinent medical, surgical, social history, OB history to HPI:1} Chief Complaint  Patient presents with   Code Stroke    Jennifer [name] is a 68 y.o. female.  HPI   68 y/o female - was with husband - had a fall when her legs gave out - having "mobility issues" - she was not responding to them - had slurred speech when it occurred - the fall occurred at 8:50 - but notes that she has been weaker the last 2 days.  Usually doesn't have slurred speech.  Had stroke in 12/24 - was given TNK and transferred to [facility] - had bleed from TNK.  She takes aspirin .  She has never had a seizure.    Patient is not able to give me any information, this was obtained from the daughter  Home Medications Prior to Admission medications   Medication Sig Start Date End Date Taking? Authorizing Provider  artificial tears (LACRILUBE) OINT ophthalmic ointment Place into both eyes at bedtime as needed for up to 62 doses for dry eyes. 05/30/23   [name], Manley Seeds, MD  aspirin  81 MG chewable tablet Chew 1 tablet (81 mg total) by mouth daily. 05/24/23   de Thayne Fine, Cortney E, NP  atorvastatin  (LIPITOR ) 80 MG tablet TAKE 1 TABLET BY MOUTH ONCE DAILY AT  6  IN  THE  EVENING 09/06/23   Mallipeddi, Vishnu P, MD  BD PEN NEEDLE NANO 2ND GEN 32G X 4 MM MISC Inject into the skin 3 (three) times daily. 07/22/21   [provider]  buPROPion  (WELLBUTRIN  SR) 150 MG 12 hr tablet Take 1 tablet by mouth twice daily 08/02/23   [name], Manley Seeds, MD  carvedilol  (COREG ) 12.5 MG tablet Take 1 tablet (12.5 mg total) by mouth daily. On non-dialysis days Patient taking differently: Take 12.5 mg by mouth at bedtime. On non-dialysis days 05/24/23   de Thayne Fine, Cortney E, NP  cephALEXin  (KEFLEX ) 500 MG capsule Give 500 mg (open and sprinkle) after dialysis for 5 doses Patient not taking: Reported on  08/04/2023 05/30/23   Clemens Curt, MD  DULoxetine  (CYMBALTA ) 60 MG capsule Take 1 capsule by mouth once daily 07/19/23   [name], Manley Seeds, MD  ferrous sulfate  325 (65 FE) MG tablet Take 325 mg by mouth as needed (given when needed at dialysis). Patient not taking: Reported on 06/01/2023    [provider]  folic acid  (FOLVITE ) 1 MG tablet Take 1 tablet by mouth once daily 07/26/23   Sethi, Pramod S, MD  furosemide  (LASIX ) 80 MG tablet Take 80 mg by mouth daily. On non-dialysis days 10/27/22   [provider]  insulin  lispro protamine-lispro (HUMALOG  75/25 MIX) (75-25) 100 UNIT/ML SUSP injection Inject 10-25 Units into the skin See admin instructions. Inject 25 units with breakfast, 10 units at lunch, 25 units at dinner    [provider]  levothyroxine  (SYNTHROID ) 175 MCG tablet 1 tablet in the morning on an empty stomach Orally Once a day    [provider]  memantine  (NAMENDA ) 10 MG tablet Take 1 tablet (10 mg total) by mouth 2 (two) times daily. 06/22/23   Lisabeth Rider, MD  nitroGLYCERIN  (NITROSTAT ) 0.4 MG SL tablet Place 0.4 mg under the tongue every 5 (five) minutes as needed for chest pain.    [provider]  polyvinyl alcohol  (LIQUIFILM TEARS) 1.4 % ophthalmic solution Place 1 drop into both eyes every 4 (four) hours. Patient not taking: Reported on 06/01/2023 05/30/23   [name], Manley Seeds, MD      Allergies    Tenecteplase  and Metformin hcl    Review of Systems   Review of Systems  Unable to perform ROS: Mental status change    Physical Exam Updated Vital Signs Ht 1.702 m (5\' 7" )   Wt 95.1 kg   BMI 32.84 kg/m  Physical Exam Vitals and nursing note reviewed.  Constitutional:      General: She is not in acute distress.    Appearance: She is well-developed.  HENT:     Head: Normocephalic and atraumatic.     Mouth/Throat:     Pharynx: No oropharyngeal exudate.  Eyes:     General: No scleral icterus.       Right eye: No discharge.         Left eye: No discharge.     Conjunctiva/sclera: Conjunctivae normal.     Pupils: Pupils are equal, round, and reactive to light.  Neck:     Thyroid : No thyromegaly.     Vascular: No JVD.  Cardiovascular:     Rate and Rhythm: Normal rate and regular rhythm.     Heart sounds: Normal heart sounds. No murmur heard.    No friction rub. No gallop.  Pulmonary:     Effort: Pulmonary effort is normal. No respiratory distress.     Breath sounds: Normal breath sounds. No wheezing or rales.  Abdominal:     General: Bowel sounds are normal. There is no distension.     Palpations: Abdomen is soft. There is no mass.     Tenderness: There is no abdominal tenderness.  Musculoskeletal:        General: No tenderness. Normal range of motion.     Cervical back: Normal range of motion and neck supple.     Right lower leg: No edema.     Left lower leg: No edema.  Lymphadenopathy:     Cervical: No cervical adenopathy.  Skin:    General: Skin is warm and dry.     Findings: No erythema or rash.  Neurological:     Mental Status: She is alert.     Coordination: Coordination normal.     Comments: Dense right sided facial droop, difficulty speaking, significant slurred speech, seems to have some left hemineglect, has some weakness of the left grip, does not lift either leg off the bed  Psychiatric:        Behavior: Behavior normal.     ED Results / Procedures / Treatments   Labs (all labs ordered are listed, but only abnormal results are displayed) Labs Reviewed  CBG MONITORING, ED - Abnormal; Notable for the following components:      Result Value   Glucose-Capillary 204 (*)    All other components within normal limits  ETHANOL  PROTIME-INR  APTT  CBC  DIFFERENTIAL  COMPREHENSIVE METABOLIC PANEL WITH GFR  ETHANOL  RAPID URINE DRUG SCREEN, HOSP PERFORMED  I-STAT CHEM 8, ED    EKG None  Radiology No results found.  Procedures .Critical Care  Performed by: Early Glisson,  MD Authorized by: Early Glisson, MD   Critical care provider statement:    Critical care time (minutes):  30   Critical care time was exclusive of:  Separately billable procedures and treating other patients and teaching time   Critical care was  necessary to treat or prevent imminent or life-threatening deterioration of the following conditions:  CNS failure or compromise   Critical care was time spent personally by me on the following activities:  Development of treatment plan with patient or surrogate, discussions with consultants, evaluation of patient's response to treatment, examination of patient, ordering and review of laboratory studies, ordering and review of radiographic studies, ordering and performing treatments and interventions, pulse oximetry, re-evaluation of patient's condition, review of old charts and obtaining history from patient or surrogate   I assumed direction of critical care for this patient from another provider in my specialty: no     Care discussed with: admitting provider   Comments:         {Document cardiac monitor, telemetry assessment procedure when appropriate:1}  Medications Ordered in ED Medications - No data to display  ED Course/ Medical Decision Making/ A&P Clinical Course as of 11/20/23 2204  Mon Nov 20, 2023  2200 Discussed imaging with radiologist, no signs of acute hemorrhage or obvious ischemic findings.  There is sequela of prior posterior fossa hemorrhage [BM]    Clinical Course User Index [BM] Early Glisson, MD   {   Click here for ABCD2, HEART and other calculatorsREFRESH Note before signing :1}                              Medical Decision Making Amount and/or Complexity of Data Reviewed Labs: ordered. Radiology: ordered.  Risk OTC drugs. Prescription drug management.   This patient is acutely critically ill with what appears to be an acute neuro neurologic deficit.  She has had a stroke in the past and received TNK with a  subsequent intracranial hemorrhage, she would not be a TNK candidate at this time, she may benefit from thrombectomy if there is a large vessel occlusion.  Code stroke activated on arrival around 9:40 PM, EMS reported normal blood sugar prehospital and the blood pressure was reasonable.   This patient presents to the ED for concern of acute change in mental status, this involves an extensive number of treatment options, and is a complaint that carries with it a high risk of complications and morbidity.  The differential diagnosis includes stroke, seizure, hemorrhage, infection, other causes of metabolic encephalopathy   Co morbidities / Chronic conditions that complicate the patient evaluation  Known prior stroke and intracranial hemorrhage   Additional history obtained:  Additional history obtained from EMR External records from outside source obtained and reviewed including family as well as prior medical records and CT reports   Lab Tests:  I Ordered, and personally interpreted labs.  The pertinent results include: Glucose 204   Imaging Studies ordered:  I ordered imaging studies including CT head without contrast I independently visualized and interpreted imaging which showed no acute hemorrhage I agree with the radiologist interpretation   Cardiac Monitoring: / EKG:  The patient was maintained on a cardiac monitor.  I personally viewed and interpreted the cardiac monitored which showed an underlying rhythm of: Normal sinus rhythm   Problem List / ED Course / Critical interventions / Medication management  I discussed the case with the neurologist who recommends that the patient be given 300 mg of rectal aspirin , they recommend that this patient not be given TNK given the prior history of intracranial hemorrhage which would be the contraindication.  They recommend admission to the hospital for the rest of the workup including an MRI and  MRA of the brain as well as other toxic  encephalopathic workup tests.  Stroke being the likely leading cause I ordered medication including 300 mg of rectal aspirin  Reevaluation of the patient after these medicines showed that the patient no change I have reviewed the patients home medicines and have made adjustments as needed   Consultations Obtained:  I requested consultation with the list,  and discussed lab and imaging findings as well as pertinent plan - they recommend: Admission   Social Determinants of Health:  Significant prior stroke   Test / Admission - Considered:  Admit to higher level of care Acutely encephalopathic with acute neurologic findings Critically ill   {Document critical care time when appropriate:1} {Document review of labs and clinical decision tools ie heart score, Chads2Vasc2 etc:1}  {Document your independent review of radiology images, and any outside records:1} {Document your discussion with family members, caretakers, and with consultants:1} {Document social determinants of health affecting pt's care:1} {Document your decision making why or why not admission, treatments were needed:1} Final Clinical Impression(s) / ED Diagnoses Final diagnoses:  None    Rx / DC Orders ED Discharge Orders     None

## 2023-11-21 ENCOUNTER — Observation Stay (HOSPITAL_COMMUNITY)

## 2023-11-21 ENCOUNTER — Observation Stay (HOSPITAL_COMMUNITY)
Admit: 2023-11-21 | Discharge: 2023-11-21 | Disposition: A | Discharge status: Home / Self Care | Attending: Family Medicine

## 2023-11-21 DIAGNOSIS — J449 Chronic obstructive pulmonary disease, unspecified: Secondary | ICD-10-CM | POA: Diagnosis present

## 2023-11-21 DIAGNOSIS — E7800 Pure hypercholesterolemia, unspecified: Secondary | ICD-10-CM | POA: Diagnosis present

## 2023-11-21 DIAGNOSIS — E11649 Type 2 diabetes mellitus with hypoglycemia without coma: Secondary | ICD-10-CM

## 2023-11-21 DIAGNOSIS — E039 Hypothyroidism, unspecified: Secondary | ICD-10-CM | POA: Diagnosis present

## 2023-11-21 DIAGNOSIS — B964 Proteus (mirabilis) (morganii) as the cause of diseases classified elsewhere: Secondary | ICD-10-CM | POA: Diagnosis present

## 2023-11-21 DIAGNOSIS — E66811 Obesity, class 1: Secondary | ICD-10-CM | POA: Diagnosis present

## 2023-11-21 DIAGNOSIS — R569 Unspecified convulsions: Secondary | ICD-10-CM

## 2023-11-21 DIAGNOSIS — E1122 Type 2 diabetes mellitus with diabetic chronic kidney disease: Secondary | ICD-10-CM | POA: Diagnosis present

## 2023-11-21 DIAGNOSIS — Z6832 Body mass index (BMI) 32.0-32.9, adult: Secondary | ICD-10-CM | POA: Diagnosis not present

## 2023-11-21 DIAGNOSIS — F0394 Unspecified dementia, unspecified severity, with anxiety: Secondary | ICD-10-CM | POA: Diagnosis present

## 2023-11-21 DIAGNOSIS — G9340 Encephalopathy, unspecified: Secondary | ICD-10-CM | POA: Diagnosis not present

## 2023-11-21 DIAGNOSIS — B9620 Unspecified Escherichia coli [E. coli] as the cause of diseases classified elsewhere: Secondary | ICD-10-CM | POA: Diagnosis present

## 2023-11-21 DIAGNOSIS — N390 Urinary tract infection, site not specified: Secondary | ICD-10-CM | POA: Diagnosis present

## 2023-11-21 DIAGNOSIS — R4182 Altered mental status, unspecified: Secondary | ICD-10-CM | POA: Diagnosis not present

## 2023-11-21 DIAGNOSIS — I120 Hypertensive chronic kidney disease with stage 5 chronic kidney disease or end stage renal disease: Secondary | ICD-10-CM | POA: Diagnosis present

## 2023-11-21 DIAGNOSIS — N2581 Secondary hyperparathyroidism of renal origin: Secondary | ICD-10-CM | POA: Diagnosis present

## 2023-11-21 DIAGNOSIS — R531 Weakness: Secondary | ICD-10-CM | POA: Diagnosis present

## 2023-11-21 DIAGNOSIS — Z992 Dependence on renal dialysis: Secondary | ICD-10-CM | POA: Diagnosis not present

## 2023-11-21 DIAGNOSIS — F0393 Unspecified dementia, unspecified severity, with mood disturbance: Secondary | ICD-10-CM | POA: Diagnosis present

## 2023-11-21 DIAGNOSIS — D631 Anemia in chronic kidney disease: Secondary | ICD-10-CM | POA: Diagnosis present

## 2023-11-21 DIAGNOSIS — Z7982 Long term (current) use of aspirin: Secondary | ICD-10-CM | POA: Diagnosis not present

## 2023-11-21 DIAGNOSIS — I2510 Atherosclerotic heart disease of native coronary artery without angina pectoris: Secondary | ICD-10-CM | POA: Diagnosis present

## 2023-11-21 DIAGNOSIS — N186 End stage renal disease: Secondary | ICD-10-CM | POA: Diagnosis present

## 2023-11-21 DIAGNOSIS — G9341 Metabolic encephalopathy: Secondary | ICD-10-CM | POA: Diagnosis present

## 2023-11-21 DIAGNOSIS — E871 Hypo-osmolality and hyponatremia: Secondary | ICD-10-CM | POA: Diagnosis present

## 2023-11-21 DIAGNOSIS — F32A Depression, unspecified: Secondary | ICD-10-CM | POA: Diagnosis present

## 2023-11-21 DIAGNOSIS — I10 Essential (primary) hypertension: Secondary | ICD-10-CM | POA: Diagnosis not present

## 2023-11-21 DIAGNOSIS — Z794 Long term (current) use of insulin: Secondary | ICD-10-CM | POA: Diagnosis not present

## 2023-11-21 DIAGNOSIS — Z79890 Hormone replacement therapy (postmenopausal): Secondary | ICD-10-CM | POA: Diagnosis not present

## 2023-11-21 LAB — CBC
HCT: 32.2 % — ABNORMAL LOW (ref 36.0–46.0)
Hemoglobin: 10.4 g/dL — ABNORMAL LOW (ref 12.0–15.0)
MCH: 31 pg (ref 26.0–34.0)
MCHC: 32.3 g/dL (ref 30.0–36.0)
MCV: 96.1 fL (ref 80.0–100.0)
Platelets: 180 10*3/uL (ref 150–400)
RBC: 3.35 MIL/uL — ABNORMAL LOW (ref 3.87–5.11)
RDW: 16 % — ABNORMAL HIGH (ref 11.5–15.5)
WBC: 10.7 10*3/uL — ABNORMAL HIGH (ref 4.0–10.5)
nRBC: 0 % (ref 0.0–0.2)

## 2023-11-21 LAB — LIPID PANEL
Cholesterol: 125 mg/dL (ref 0–200)
HDL: 67 mg/dL (ref >40)
LDL Cholesterol: 51 mg/dL (ref 0–99)
Total CHOL/HDL Ratio: 1.9 ratio
Triglycerides: 36 mg/dL (ref <150)
VLDL: 7 mg/dL (ref 0–40)

## 2023-11-21 LAB — BASIC METABOLIC PANEL WITH GFR
Anion gap: 12 (ref 5–15)
BUN: 70 mg/dL — ABNORMAL HIGH (ref 8–23)
CO2: 24 mmol/L (ref 22–32)
Calcium: 8.1 mg/dL — ABNORMAL LOW (ref 8.9–10.3)
Chloride: 98 mmol/L (ref 98–111)
Creatinine, Ser: 6.1 mg/dL — ABNORMAL HIGH (ref 0.44–1.00)
GFR, Estimated: 7 mL/min — ABNORMAL LOW (ref >60)
Glucose, Bld: 61 mg/dL — ABNORMAL LOW (ref 70–99)
Potassium: 4.2 mmol/L (ref 3.5–5.1)
Sodium: 134 mmol/L — ABNORMAL LOW (ref 135–145)

## 2023-11-21 LAB — TROPONIN I (HIGH SENSITIVITY): Troponin I (High Sensitivity): 31 ng/L — ABNORMAL HIGH (ref <18)

## 2023-11-21 LAB — URINALYSIS, ROUTINE W REFLEX MICROSCOPIC
Bilirubin Urine: NEGATIVE
Glucose, UA: NEGATIVE mg/dL
Ketones, ur: NEGATIVE mg/dL
Nitrite: NEGATIVE
Protein, ur: 100 mg/dL — AB
Specific Gravity, Urine: 1.014 (ref 1.005–1.030)
WBC, UA: >50 WBC/hpf (ref 0–5)
pH: 7 (ref 5.0–8.0)

## 2023-11-21 LAB — CBG MONITORING, ED
Glucose-Capillary: 138 mg/dL — ABNORMAL HIGH (ref 70–99)
Glucose-Capillary: 178 mg/dL — ABNORMAL HIGH (ref 70–99)
Glucose-Capillary: 61 mg/dL — ABNORMAL LOW (ref 70–99)
Glucose-Capillary: 70 mg/dL (ref 70–99)
Glucose-Capillary: 71 mg/dL (ref 70–99)
Glucose-Capillary: 75 mg/dL (ref 70–99)
Glucose-Capillary: 85 mg/dL (ref 70–99)

## 2023-11-21 LAB — ECHOCARDIOGRAM COMPLETE
Area-P 1/2: 3 cm2
Height: 67 in
S' Lateral: 3.3 cm
Weight: 3355.2 [oz_av]

## 2023-11-21 LAB — T4, FREE: Free T4: 1.06 ng/dL (ref 0.61–1.12)

## 2023-11-21 LAB — TSH: TSH: 1.353 u[IU]/mL (ref 0.350–4.500)

## 2023-11-21 LAB — HEPATITIS B SURFACE ANTIGEN: Hepatitis B Surface Ag: NONREACTIVE

## 2023-11-21 LAB — VITAMIN B12: Vitamin B-12: 586 pg/mL (ref 180–914)

## 2023-11-21 LAB — GLUCOSE, CAPILLARY: Glucose-Capillary: 137 mg/dL — ABNORMAL HIGH (ref 70–99)

## 2023-11-21 MED ORDER — NEPRO/CARBSTEADY PO LIQD: 237.0000 mL | ORAL | Status: DC | PRN

## 2023-11-21 MED ORDER — CHLORHEXIDINE GLUCONATE CLOTH 2 % EX PADS
6.0000 | MEDICATED_PAD | Freq: Every day | CUTANEOUS | Status: DC
Start: 2023-11-21 — End: 2023-11-26
  Administered 2023-11-22 – 2023-11-26 (×4): 6 via TOPICAL

## 2023-11-21 MED ORDER — LIDOCAINE-PRILOCAINE 2.5-2.5 % EX CREA: 1.0000 | TOPICAL_CREAM | CUTANEOUS | Status: DC | PRN

## 2023-11-21 MED ORDER — DEXTROSE 50 % IV SOLN: 1.0000 | Freq: Once | INTRAVENOUS | Status: AC

## 2023-11-21 MED ORDER — DEXTROSE 50 % IV SOLN
INTRAVENOUS | Status: AC
Start: 2023-11-21 — End: 2023-11-21
  Filled 2023-11-21: qty 50

## 2023-11-21 MED ORDER — DEXTROSE 50 % IV SOLN
1.0000 | Freq: Once | INTRAVENOUS | Status: AC
  Administered 2023-11-21: 50 mL via INTRAVENOUS

## 2023-11-21 MED ORDER — DEXTROSE 50 % IV SOLN
INTRAVENOUS | Status: AC
  Administered 2023-11-21: 50 mL via INTRAVENOUS
  Filled 2023-11-21: qty 50

## 2023-11-21 MED ORDER — PENTAFLUOROPROP-TETRAFLUOROETH EX AERO: 1.0000 | INHALATION_SPRAY | CUTANEOUS | Status: DC | PRN

## 2023-11-21 MED ORDER — LIDOCAINE HCL (PF) 1 % IJ SOLN: 5.0000 mL | INTRAMUSCULAR | Status: DC | PRN

## 2023-11-21 MED ORDER — DEXTROSE-SODIUM CHLORIDE 5-0.9 % IV SOLN: INTRAVENOUS | Status: AC

## 2023-11-21 NOTE — TOC Initial Note (Signed)
 Transition of Care [facility]) - Initial/Assessment Note    Patient Details  Name: Jennifer [name] MRN: [id number] Date of Birth: 1955/08/26  Transition of Care [facility]) CM/SW Contact:    Orelia Binet, RN Phone Number: 11/21/2023, 2:47 PM  Clinical Narrative:        Patient admitted with acute left sided weakness. CM at the bedside to discuss PT  eval with her daughter. PT is recommending SNF. Daughter said patient does home dialysis with [facility].  FL2 completed and sent out for bed offers.  TOC will CLIP patient for a chair time. TOC following for bed offers to discuss with Daughters.    Expected Discharge Plan: Skilled Nursing Facility Barriers to Discharge: Continued Medical Work up   Patient Goals and CMS Choice Patient states their goals for this hospitalization and ongoing recovery are:: agreeable to SNF CMS Medicare.gov Compare Post Acute Care list provided to:: Patient Represenative (must comment) Choice offered to / list presented to : Adult Children     Expected Discharge Plan and Services      Living arrangements for the past 2 months: Single Family Home                  Prior Living Arrangements/Services Living arrangements for the past 2 months: Single Family Home Lives with:: Adult Children Patient language and need for interpreter reviewed:: Yes        Need for Family Participation in Patient Care: Yes (Comment)   Current home services: DME Criminal Activity/Legal Involvement Pertinent to Current Situation/Hospitalization: No - Comment as needed  Activities of Daily Living   ADL Screening (condition at time of admission) Independently performs ADLs?: No Does the patient have a NEW difficulty with bathing/dressing/toileting/self-feeding that is expected to last >3 days?: Yes (Initiates electronic notice to provider for possible OT consult) Does the patient have a NEW difficulty with getting in/out of bed, walking, or climbing stairs that is expected to last >3  days?: Yes (Initiates electronic notice to provider for possible PT consult) Does the patient have a NEW difficulty with communication that is expected to last >3 days?: Yes (Initiates electronic notice to provider for possible SLP consult) Is the patient deaf or have difficulty hearing?: No Does the patient have difficulty seeing, even when wearing glasses/contacts?: No Does the patient have difficulty concentrating, remembering, or making decisions?: No  Permission Sought/Granted           Emotional Assessment     Affect (typically observed): Accepting Orientation: : Oriented to Self, Oriented to Place Alcohol  / Substance Use: Not Applicable Psych Involvement: No (comment)  Admission diagnosis:  Acute ischemic stroke [facility]) [I63.9] Acute left-sided weakness [R53.1] Patient Active Problem List   Diagnosis Date Noted   Acute left-sided weakness 11/20/2023   Abnormal urine odor 05/30/2023   Recurrent strokes (HCC) 05/20/2023   Anemia of chronic renal failure 12/19/2022   (HFpEF) heart failure with preserved ejection fraction (HCC) 11/25/2022   Auditory hallucination 07/26/2022   Carotid artery stenosis, asymptomatic, left 05/04/2022   Anemia of chronic disease 03/23/2022   Hyponatremia 03/23/2022   Hyperkalemia 03/23/2022   Dehydration 03/23/2022   Chronic diastolic CHF (congestive heart failure) (HCC) 03/23/2022   Closed left ankle fracture 01/26/2022   Type 2 diabetes mellitus with hyperglycemia (HCC) 01/07/2022   Acute kidney injury superimposed on chronic kidney disease (HCC) 11/19/2021   Frequent urination 07/13/2021   Change in mental status 07/13/2021   Gait abnormality 07/13/2021   Memory loss 07/13/2021   Abnormal  urinalysis 07/13/2021   ESRD (end stage renal disease) (HCC) 07/13/2021   Normocytic anemia 07/13/2021   Diabetic nephropathy (HCC) 05/28/2020   Diabetic retinopathy (HCC) 05/28/2020   Presence of insulin  pump (external) (internal) 05/28/2020    Diabetic macular edema of right eye with proliferative retinopathy associated with type 2 diabetes mellitus (HCC) 04/16/2020   Macular pucker, right eye 04/16/2020   Vitreous hemorrhage of left eye (HCC) 04/16/2020   Pre-op examination 03/13/2020   Chronic venous insufficiency 02/06/2020   Constipation 05/28/2019   S/P TKR (total knee replacement), right 03/23/2019 05/07/2019   Primary osteoarthritis of right knee 04/02/2019   Unilateral primary osteoarthritis, right knee    Pedal edema 02/25/2019   Acute vertigo with vomiting and inability to stand 10/08/2018   Uncontrolled type 2 diabetes mellitus with hyperglycemia, with long-term current use of insulin  (HCC) 10/08/2018   Former smoker 10/08/2018   Carotid artery disease (HCC) 03/15/2018   History of CVA (cerebrovascular accident) 11/16/2017   Low back pain 10/26/2017   Screening mammogram, encounter for 09/18/2017   Coronary artery disease involving native coronary artery of native heart without angina pectoris 11/04/2015   NSTEMI (non-ST elevated myocardial infarction) (HCC) 11/03/2015   Leukocytosis 08/16/2010   Acquired hypothyroidism 07/06/2010   HLD (hyperlipidemia) 08/28/2008   Essential hypertension 11/13/2007   Proliferative diabetic retinopathy of left eye with macular edema associated with type 2 diabetes mellitus (HCC) 09/21/2006   Diabetic peripheral neuropathy (HCC) 09/21/2006   Obesity (BMI 30-39.9) 09/21/2006   Adjustment disorder with mixed anxiety and depressed mood 09/21/2006   CARPAL TUNNEL SYNDROME 09/21/2006   COPD (chronic obstructive pulmonary disease) (HCC) 09/21/2006   EDEMA 09/21/2006   MIGRAINES, HX OF 09/21/2006   PCP:  Clemens Curt, MD Pharmacy:   [facility] [address] [place] - [address] [place] [address] Phone: [phone] Fax: [phone]     Social Drivers of Health (SDOH) Social History: SDOH Screenings   Food Insecurity: No Food  Insecurity (11/21/2023)  Housing: Low Risk  (11/21/2023)  Transportation Needs: No Transportation Needs (11/21/2023)  Utilities: Not At Risk (11/21/2023)  Alcohol  Screen: Low Risk  (03/07/2023)  Depression (PHQ2-9): High Risk (05/30/2023)  Financial Resource Strain: Low Risk  (03/07/2023)  Physical Activity: Inactive (03/07/2023)  Social Connections: Socially Integrated (11/21/2023)  Stress: No Stress Concern Present (03/07/2023)  Tobacco Use: Medium Risk (11/20/2023)  Health Literacy: Adequate Health Literacy (03/07/2023)   SDOH Interventions:

## 2023-11-21 NOTE — Procedures (Signed)
 Patient Name: Jennifer [name]  MRN: [id number]  Epilepsy Attending: Arleene Lack  Referring Physician/Provider: Arleene Lack, MD  Date: 11/21/2023 Duration: 23.40 mins  Patient history: 68 year old female presented with altered mental status and left-sided weakness. EEG to evaluate for seizure  Level of alertness: Awake, asleep  AEDs during EEG study: None  Technical aspects: This EEG study was done with scalp electrodes positioned according to the 10-20 International system of electrode placement. Electrical activity was reviewed with band pass filter of 1-70Hz , sensitivity of 7 uV/mm, display speed of 39mm/sec with a 60Hz  notched filter applied as appropriate. EEG data were recorded continuously and digitally stored.  Video monitoring was available and reviewed as appropriate.  Description: The posterior dominant rhythm consists of 6 Hz activity of moderate voltage (25-35 uV) seen predominantly in posterior head regions, symmetric and reactive to eye opening and eye closing. Sleep was characterized by vertex waves, sleep spindles (12 to 14 Hz), maximal frontocentral region. EEG showed continuous generalized predominantly 5  to 6 Hz theta-delta slowing admixed with intermittent 2-3hz  delta slowing.  Physiologic photic driving was not seen during photic stimulation.  Hyperventilation was not performed.     ABNORMALITY - Continuous slow, generalized  IMPRESSION: This study is suggestive of moderate diffuse encephalopathy. No seizures or epileptiform discharges were seen throughout the recording.  Art Levan O Himani Corona

## 2023-11-21 NOTE — Evaluation (Signed)
 Physical Therapy Evaluation Patient Details Name: Jennifer [name] MRN: [id number] DOB: May 06, 1956 Today's Date: 11/21/2023  History of Present Illness  Jennifer [name] is a 68 y.o. female with medical history significant for hypertension, type 2 diabetes mellitus, hypothyroidism, ESRD on hemodialysis, CAD, depression, anxiety, memory loss, and history of CVA with residual deficits who presents with acute onset of left-sided weakness and speech difficulty.     Patient fell today and when her daughter was trying to assist her back up, she noted that the patient had new left-sided weakness, confusion, and difficulty with her speech.  She had been in her usual state at approximately 5 PM.   Clinical Impression  Patient demonstrates slow labored movement for sitting up at bedside, once seated able to lift legs against gravity, had poor left hand grip strength for holding on to RW and limited to stand for a couple of minutes using, unable to take steps due to fall risk, BLE weakness. Patient put back to bed with Max assist for repositioning. Patient will benefit from continued skilled physical therapy in hospital and recommended venue below to increase strength, balance, endurance for safe ADLs and gait.          If plan is discharge home, recommend the following: A lot of help with bathing/dressing/bathroom;A lot of help with walking and/or transfers;Help with stairs or ramp for entrance;Assistance with cooking/housework   Can travel by private vehicle   No    Equipment Recommendations None recommended by PT  Recommendations for Other Services       Functional Status Assessment Patient has had a recent decline in their functional status and demonstrates the ability to make significant improvements in function in a reasonable and predictable amount of time.     Precautions / Restrictions Precautions Precautions: Fall Recall of Precautions/Restrictions: Impaired Restrictions Weight Bearing  Restrictions Per Provider Order: No      Mobility  Bed Mobility Overal bed mobility: Needs Assistance Bed Mobility: Supine to Sit     Supine to sit: Max assist, HOB elevated     General bed mobility comments: slow labored movement    Transfers Overall transfer level: Needs assistance Equipment used: Rolling walker (2 wheels) Transfers: Sit to/from Stand Sit to Stand: Total assist, Max assist, +2 physical assistance           General transfer comment: poor tolerance for standing with RW due to weakness    Ambulation/Gait                  Stairs            Wheelchair Mobility     Tilt Bed    Modified Rankin (Stroke Patients Only)       Balance Overall balance assessment: Needs assistance Sitting-balance support: Feet supported, No upper extremity supported Sitting balance-Leahy Scale: Poor Sitting balance - Comments: fair/poor seated at EOB   Standing balance support: Bilateral upper extremity supported, During functional activity, Reliant on assistive device for balance Standing balance-Leahy Scale: Poor Standing balance comment: using RW                             Pertinent Vitals/Pain Pain Assessment Pain Assessment: Faces Faces Pain Scale: Hurts a little bit Pain Location: B shoulder with P/ROM Pain Descriptors / Indicators: Moaning Pain Intervention(s): Limited activity within patient's tolerance, Monitored during session, Repositioned    Home Living Family/patient expects to be discharged to:: Private residence Living  Arrangements: Spouse/significant other;Children Available Help at Discharge: Family;Available 24 hours/day Type of Home: House Home Access: Stairs to enter;Ramped entrance Entrance Stairs-Rails: Can reach both Entrance Stairs-Number of Steps: 5-6   Home Layout: One level Home Equipment: Agricultural consultant (2 wheels);Cane - single point;BSC/3in1;Shower seat;Hospital bed;Wheelchair - manual;Grab bars -  tub/shower Additional Comments: per chart    Prior Function Prior Level of Function : Needs assist;History of Falls (last six months);Patient poor historian/Family not available       Physical Assist : ADLs (physical);Mobility (physical) Mobility (physical): Transfers;Gait;Stairs;Bed mobility   Mobility Comments: Assisted stand pivot transfers at home per report from RN ADLs Comments: Assist for all ADL's per chart     Extremity/Trunk Assessment   Upper Extremity Assessment Upper Extremity Assessment: Defer to OT evaluation    Lower Extremity Assessment Lower Extremity Assessment: Generalized weakness;LLE deficits/detail LLE Deficits / Details: grossly -3/5 LLE Sensation: decreased light touch LLE Coordination: decreased fine motor    Cervical / Trunk Assessment Cervical / Trunk Assessment: Kyphotic  Communication   Communication Communication: Impaired Factors Affecting Communication: Difficulty expressing self    Cognition Arousal: Lethargic Behavior During Therapy: Flat affect   PT - Cognitive impairments: No apparent impairments                         Following commands: Impaired Following commands impaired: Follows one step commands inconsistently     Cueing Cueing Techniques: Verbal cues, Tactile cues     General Comments      Exercises     Assessment/Plan    PT Assessment Patient needs continued PT services  PT Problem List Decreased strength;Decreased activity tolerance;Decreased balance;Decreased mobility       PT Treatment Interventions DME instruction;Functional mobility training;Therapeutic activities;Therapeutic exercise;Balance training;Patient/family education;Wheelchair mobility training;Gait training    PT Goals (Current goals can be found in the Care Plan section)  Acute Rehab PT Goals Patient Stated Goal: return home PT Goal Formulation: With patient Time For Goal Achievement: 12/05/23 Potential to Achieve Goals:  Good    Frequency Min 3X/week     Co-evaluation PT/OT/SLP Co-Evaluation/Treatment: Yes Reason for Co-Treatment: To address functional/ADL transfers;Complexity of the patient's impairments (multi-system involvement) PT goals addressed during session: Mobility/safety with mobility;Balance;Proper use of DME         AM-PAC PT "6 Clicks" Mobility  Outcome Measure Help needed turning from your back to your side while in a flat bed without using bedrails?: A Lot Help needed moving from lying on your back to sitting on the side of a flat bed without using bedrails?: A Lot Help needed moving to and from a bed to a chair (including a wheelchair)?: A Lot Help needed standing up from a chair using your arms (e.g., wheelchair or bedside chair)?: A Lot Help needed to walk in hospital room?: A Lot Help needed climbing 3-5 steps with a railing? : Total 6 Click Score: 11    End of Session Equipment Utilized During Treatment: Gait belt Activity Tolerance: Patient tolerated treatment well;Patient limited by fatigue Patient left: in bed;with call bell/phone within reach Nurse Communication: Mobility status PT Visit Diagnosis: Unsteadiness on feet (R26.81);Other abnormalities of gait and mobility (R26.89);Muscle weakness (generalized) (M62.81)    Time: 7846-9629 PT Time Calculation (min) (ACUTE ONLY): 24 min   Charges:   PT Evaluation $PT Eval Moderate Complexity: 1 Mod PT Treatments $Therapeutic Activity: 23-37 mins PT General Charges $$ ACUTE PT VISIT: 1 Visit  2:43 PM, 11/21/23 Walton Guppy, MPT Physical Therapist with [facility] 336 [phone] office [phone] mobile phone

## 2023-11-21 NOTE — Progress Notes (Signed)
 PROGRESS NOTE    Jennifer [name]  ZOX:[id number] DOB: 19-Sep-1955 DOA: 11/20/2023 PCP: Clemens Curt, MD   Brief Narrative:    Jennifer [name] is a 68 y.o. female with medical history significant for hypertension, type 2 diabetes mellitus, hypothyroidism, ESRD on hemodialysis, CAD, depression, anxiety, memory loss, and history of CVA with residual deficits who presents with acute onset of left-sided weakness and speech difficulty.  Patient has been admitted for evaluation of acute encephalopathy with left-sided weakness.  MRI does not show any signs of acute stroke.  EEG currently pending.  Assessment & Plan:   Principal Problem:   Acute left-sided weakness Active Problems:   COPD (chronic obstructive pulmonary disease) (HCC)   Acquired hypothyroidism   Coronary artery disease involving native coronary artery of native heart without angina pectoris   History of CVA (cerebrovascular accident)   ESRD (end stage renal disease) (HCC)  Assessment and Plan:   1. Left-sided weakness and speech difficulty  - Appreciate neurology evaluation with plan to assess EEG -Continue aspirin  81 mg daily -PT recommending SNF -Dysphagia 1 diet per SLP   2. ERSD on home HD 4x/week - HD per nephrology today   3. Depression, anxiety, memory-loss  - Use delirium precautions, hold potentially sedating medications for now     4. Type II DM  - A1c was 6.2% in December 2024  - Check CBGs, use long-acting insulin  and sliding-scale correctional for now     5. Hypertension  - Permit HTN for now     6. Hypothyroidism  - Synthroid     7. CAD  - Continue ASA and Lipitor , hold beta-blocker for now while permitting HTN    8. Hx of CVA  - ASA, Lipitor       9. Obesity, Class 1 -BMI 32.84   DVT prophylaxis:Heparin  Code Status: Full Family Communication: None at bedside Disposition Plan:  Status is: Observation The patient will require care spanning > 2 midnights and should be moved to inpatient  because: Need for inpatient monitoring.   Consultants:  Neurology Nephrology  Procedures:  None  Antimicrobials:  None   Subjective: Patient seen and evaluated today with no new acute complaints or concerns. No acute concerns or events noted overnight.  Objective: Vitals:   11/21/23 0600 11/21/23 0615 11/21/23 0630 11/21/23 0645  BP: (!) 119/52 (!) 112/48 123/62 (!) 120/47  Pulse: 74 77 75 75  Resp: 12 13 15 12   Temp:      TempSrc:      SpO2: 95% 95% 96% 97%  Weight:      Height:       No intake or output data in the 24 hours ending 11/21/23 0725 Filed Weights   11/20/23 2139  Weight: 95.1 kg    Examination:  General exam: Appears calm and comfortable  Respiratory system: Clear to auscultation. Respiratory effort normal. Cardiovascular system: S1 & S2 heard, RRR.  Gastrointestinal system: Abdomen is soft Central nervous system: Alert and awake Extremities: No edema Skin: No significant lesions noted Psychiatry: Flat affect.    Data Reviewed: I have personally reviewed following labs and imaging studies  CBC: Recent Labs  Lab 11/20/23 2141 11/20/23 2230 11/21/23 0548  WBC 8.6  --  10.7*  NEUTROABS 6.5  --   --   HGB 10.2* 9.9* 10.4*  HCT 31.5* 29.0* 32.2*  MCV 97.5  --  96.1  PLT 239  --  180   Basic Metabolic Panel: Recent Labs  Lab 11/20/23 2141 11/20/23  2230 11/21/23 0548  NA 131* 130* 134*  K 4.2 3.9 4.2  CL 93* 94* 98  CO2 25  --  24  GLUCOSE 207* 202* 61*  BUN 60* 59* 70*  CREATININE 5.71* 5.90* 6.10*  CALCIUM  8.0*  --  8.1*   GFR: Estimated Creatinine Clearance: 10.5 mL/min (A) (by C-G formula based on SCr of 6.1 mg/dL (H)). Liver Function Tests: Recent Labs  Lab 11/20/23 2141  AST 18  ALT 13  ALKPHOS 65  BILITOT 0.8  PROT 4.9*  ALBUMIN 2.3*   No results for input(s): "LIPASE", "AMYLASE" in the last 168 hours. No results for input(s): "AMMONIA" in the last 168 hours. Coagulation Profile: Recent Labs  Lab  11/20/23 2141  INR 1.2   Cardiac Enzymes: No results for input(s): "CKTOTAL", "CKMB", "CKMBINDEX", "TROPONINI" in the last 168 hours. BNP (last 3 results) No results for input(s): "PROBNP" in the last 8760 hours. HbA1C: No results for input(s): "HGBA1C" in the last 72 hours. CBG: Recent Labs  Lab 11/20/23 2138 11/21/23 0005 11/21/23 0316 11/21/23 0601 11/21/23 0638  GLUCAP 204* 178* 71 61* 138*   Lipid Profile: Recent Labs    11/21/23 0548  CHOL 125  HDL 67  LDLCALC 51  TRIG 36  CHOLHDL 1.9   Thyroid  Function Tests: Recent Labs    11/20/23 2359  TSH 1.353   Anemia Panel: Recent Labs    11/20/23 2359  VITAMINB12 586   Sepsis Labs: No results for input(s): "PROCALCITON", "LATICACIDVEN" in the last 168 hours.  No results found for this or any previous visit (from the past 240 hours).       Radiology Studies: CT ANGIO HEAD NECK W WO CM (CODE STROKE) Result Date: 11/20/2023 CLINICAL DATA:  Initial evaluation for acute neuro deficit, stroke suspected. EXAM: CT ANGIOGRAPHY HEAD AND NECK WITH AND WITHOUT CONTRAST TECHNIQUE: Multidetector CT imaging of the head and neck was performed using the standard protocol during bolus administration of intravenous contrast. Multiplanar CT image reconstructions and MIPs were obtained to evaluate the vascular anatomy. Carotid stenosis measurements (when applicable) are obtained utilizing NASCET criteria, using the distal internal carotid diameter as the denominator. RADIATION DOSE REDUCTION: This exam was performed according to the departmental dose-optimization program which includes automated exposure control, adjustment of the mA and/or kV according to patient size and/or use of iterative reconstruction technique. CONTRAST:  75mL OMNIPAQUE  IOHEXOL  350 MG/ML SOLN COMPARISON:  Comparison made with head CT from earlier the same day as well as prior CTA from 05/20/2023. FINDINGS: CTA NECK FINDINGS Aortic arch: Visualized aortic arch  within normal limits for caliber with standard 3 vessel morphology. Aortic atherosclerosis. No significant stenosis about the origin the great vessels. Right carotid system: Right common and internal carotid arteries are patent without dissection. Mild atheromatous change about the right carotid bulb without hemodynamically significant greater than 50% stenosis. Left carotid system: Left common and internal carotid arteries are patent without dissection. Mild atheromatous change about the left carotid bulb without hemodynamically significant stenosis. Vertebral arteries: Both vertebral arteries arise from subclavian arteries. Vertebral arteries are patent without stenosis or dissection. Skeleton: No worrisome osseous lesions. Other neck: No other acute finding. Upper chest: No other acute finding. Review of the MIP images confirms the above findings CTA HEAD FINDINGS Anterior circulation: Atheromatous change about the carotid siphons with associated mild to moderate multifocal narrowing bilaterally. A1 segments patent bilaterally. Normal anterior communicating artery complex. Anterior cerebral arteries patent without significant stenosis. No M1 stenosis or occlusion. Distal  MCA branches perfused and symmetric. Posterior circulation: Both V4 segments patent without stenosis. Both PICA patent at their origins. Basilar patent without stenosis. Superior cerebral arteries patent bilaterally. Both PCAs primarily supplied via the basilar. Atheromatous irregularity about the PCAs bilaterally the with associated mild to moderate bilateral P2 stenoses. PCAs remain patent to their distal aspects. Venous sinuses: Patent allowing for timing the contrast bolus. Anatomic variants: None significant.  No aneurysm. Review of the MIP images confirms the above findings IMPRESSION: 1. Negative CTA for large vessel occlusion or other emergent finding. 2. Intracranial atherosclerotic disease, most notably about the carotid siphons and  PCAs where there are associated mild to moderate multifocal stenoses. 3. Mild atheromatous change about the carotid bifurcations without significant stenosis. No hemodynamically significant stenosis within the neck. 4. Aortic Atherosclerosis (ICD10-I70.0). Results were called by telephone at the time of interpretation on 11/20/2023 at 10:18 pm to provider [facility] , who verbally acknowledged these results. Electronically Signed   By: Virgia Griffins M.D.   On: 11/20/2023 22:19   CT HEAD CODE STROKE WO CONTRAST Result Date: 11/20/2023 CLINICAL DATA:  Code stroke. Initial evaluation for acute neuro deficit, stroke suspected. EXAM: CT HEAD WITHOUT CONTRAST TECHNIQUE: Contiguous axial images were obtained from the base of the skull through the vertex without intravenous contrast. RADIATION DOSE REDUCTION: This exam was performed according to the departmental dose-optimization program which includes automated exposure control, adjustment of the mA and/or kV according to patient size and/or use of iterative reconstruction technique. COMPARISON:  Prior study from 05/20/2023 FINDINGS: Brain: Generalized age-related cerebral atrophy. Patchy hypodensity involving the supratentorial cerebral white matter, consistent chronic small vessel ischemic disease, advanced in nature. Remote right basal ganglia lacunar infarct. Additional chronic left cerebellar infarct. No acute intracranial hemorrhage. No acute large vessel territory infarct. No mass lesion or midline shift. No hydrocephalus or extra-axial fluid collection. Vascular: No abnormal hyperdense vessel. Calcified atherosclerosis present about the skull base. Skull: Scalp soft tissues within normal limits.  Calvarium intact. Sinuses/Orbits: Globes and orbital soft tissues within normal limits. Small osteoma noted within the right ethmoidal air cells. Paranasal sinuses are largely clear. No significant mastoid effusion. Other: None. ASPECTS [facility] Stroke Program  Early CT Score) - Ganglionic level infarction (caudate, lentiform nuclei, internal capsule, insula, M1-M3 cortex): 7 - Supraganglionic infarction (M4-M6 cortex): 3 Total score (0-10 with 10 being normal): 10 IMPRESSION: 1. No acute intracranial abnormality. 2. ASPECTS is 10. 3. Atrophy with advanced chronic microvascular ischemic disease, with chronic right basal ganglia and left cerebellar infarcts. Results were called by telephone at the time of interpretation on 11/20/2023 at 9:55 pm to provider [facility] , who verbally acknowledged these results. Electronically Signed   By: Virgia Griffins M.D.   On: 11/20/2023 22:00        Scheduled Meds:   stroke: early stages of recovery book   Does not apply Once   aspirin   81 mg Oral Daily   atorvastatin   80 mg Oral q1800   doxycycline   100 mg Oral BID   heparin   5,000 Units Subcutaneous Q8H   insulin  aspart  0-6 Units Subcutaneous Q4H   levothyroxine   175 mcg Oral Q0600   sevelamer carbonate  0.8 g Oral TID WC   tobramycin  1 drop Right Eye Q2H   Continuous Infusions:  dextrose  5 % and 0.9 % NaCl       LOS: 0 days    Time spent: 55 minutes    Kip Cropp Loran Rock, DO [facility]  If 7PM-7AM, please contact night-coverage [email or web address] 11/21/2023, 7:25 AM

## 2023-11-21 NOTE — Plan of Care (Signed)
  Problem: SLP Dysphagia Goals Goal: Patient will utilize recommended strategies Description: Patient will utilize recommended strategies during swallow to increase swallowing safety with Flowsheets (Taken 11/21/2023 1308) Patient will utilize recommended strategies during swallow to increase swallowing safety with: mod assist Goal: Patient will demonstrate readiness for PO's Description: Patient will demonstrate readiness for PO's and/or instrumental swallow study as evidenced by: Flowsheets (Taken 11/21/2023 1308) Patient will demonstrate readiness for PO's and/or instrumental swallow study as evidenced by: with mod assist

## 2023-11-21 NOTE — Consult Note (Addendum)
 I connected with  Jennifer [name] on 11/21/23 by a video enabled telemedicine application and verified that I am speaking with the correct person using two identifiers.   I discussed the limitations of evaluation and management by telemedicine. The patient expressed understanding and agreed to proceed.  Location of patient: [facility] Location of physician: [facility]   Neurology Consultation Reason for Consult: Stroke Referring Physician: Dr. Doreene Gammon  CC: Altered mental status, left-sided weakness  History is obtained from: Chart review, patient's daughter at bedside as patient is altered  HPI: Jennifer [name] is a 68 y.o. female with past medical history of hypertension, hyperlipidemia, diabetes, coronary artery disease, end-stage renal disease, prior CVA, dementia who presented with altered mental status and left-sided weakness.   Per daughter at bedside, at baseline patient is able to walk around with a walker and take care of her basic ADLs.  However for the last week or so she was using a wheelchair and over the last 2 days she needed help to get in and out of wheelchair as well.  She also had 2 falls over the last 2 days.  Per daughter her mental status also worsened where she could not recognize her own sister.  Was normotensive on arrival.  However had mild hyponatremia with sodium of 131, BUN 60 with creatinine 5.71.  This morning blood glucose was 61.    ROS: Unable to obtain due to altered mental status.   Past Medical History:  Diagnosis Date   CAD (coronary artery disease)    cath 5/23 100% dist RCA lesion treated with 2 overlapping Integrity Resolute DES ( 2.25x37mm, 2.25x26mm), 60% mid RCA treated medically, 99% OM2 not amenable to PCI, 70% D1 lesion, EF normal   Hypercholesteremia    Hypertension    Hypothyroidism    Kidney disease    Renal disorder    Stroke (HCC) 10/2018   Thyroid  disease    Type 2 diabetes mellitus (HCC) 10/08/2018     Family History  Problem Relation Age of Onset   Heart disease Mother    Stroke Sister    Heart attack Brother    Breast cancer Neg Hx      Social History:  reports that she quit smoking about 2 years ago. Her smoking use included cigarettes. She has never used smokeless tobacco. She reports that she does not drink alcohol  and does not use drugs.   Exam: Current vital signs: BP (!) 122/50   Pulse 76   Temp 98 F (36.7 C)   Resp 12   Ht 5\' 7"  (1.702 m)   Wt 95.1 kg   SpO2 96%   BMI 32.84 kg/m  Vital signs in last 24 hours: Temp:  [98 F (36.7 C)-98.2 F (36.8 C)] 98 F (36.7 C) (06/10 0545) Pulse Rate:  [74-91] 76 (06/10 0815) Resp:  [10-18] 12 (06/10 0815) BP: (100-135)/(47-81) 122/50 (06/10 0815) SpO2:  [92 %-99 %] 96 % (06/10 0815) Weight:  [95.1 kg] 95.1 kg (06/09 2139)   Physical Exam  Constitutional: Laying in bed, not in apparent distress Neuro: Opens eyes to repeated verbal stimulation, did not have any orientation questions or follow any commands, did appear to track her daughter in the room, pupils appear equally round and reactive, no apparent facial asymmetry, antigravity strength after repeated stimulation with left hemiparesis   I have reviewed labs in epic and the results pertinent to this consultation are: CBC:  Recent Labs  Lab 11/20/23 2141  11/20/23 2230 11/21/23 0548  WBC 8.6  --  10.7*  NEUTROABS 6.5  --   --   HGB 10.2* 9.9* 10.4*  HCT 31.5* 29.0* 32.2*  MCV 97.5  --  96.1  PLT 239  --  180    Basic Metabolic Panel:  Lab Results  Component Value Date   NA 134 (L) 11/21/2023   K 4.2 11/21/2023   CO2 24 11/21/2023   GLUCOSE 61 (L) 11/21/2023   BUN 70 (H) 11/21/2023   CREATININE 6.10 (H) 11/21/2023   CALCIUM  8.1 (L) 11/21/2023   GFRNONAA 7 (L) 11/21/2023   GFRAA 43 (L) 02/05/2020   Lipid Panel:  Lab Results  Component Value Date   LDLCALC 51 11/21/2023   HgbA1c:  Lab Results  Component Value Date   HGBA1C 6.2 (H)  05/20/2023   Urine Drug Screen:     Component Value Date/Time   LABOPIA NONE DETECTED 11/20/2023 2215   COCAINSCRNUR NONE DETECTED 11/20/2023 2215   LABBENZ NONE DETECTED 11/20/2023 2215   AMPHETMU NONE DETECTED 11/20/2023 2215   THCU NONE DETECTED 11/20/2023 2215   LABBARB NONE DETECTED 11/20/2023 2215    Alcohol  Level     Component Value Date/Time   [facility] <15 11/20/2023 2140     I have reviewed the images obtained:  CT head without contrast 11/20/2023: . No acute intracranial abnormality.  ASPECTS is 10. Atrophy with advanced chronic microvascular ischemic disease, with chronic right basal ganglia and left cerebellar infarcts.  CTA head and neck with and without contrast 11/20/2023: Negative CTA for large vessel occlusion or other emergent finding. Intracranial atherosclerotic disease, most notably about the carotid siphons and PCAs where there are associated mild to moderate multifocal stenoses. Mild atheromatous change about the carotid bifurcations without significant stenosis. No hemodynamically significant stenosis within the neck. Aortic Atherosclerosis   ASSESSMENT/PLAN: 68 year old female presented with altered mental status and left-sided weakness  Acute encephalopathy Left-sided weakness Chronic strokes End-stage renal disease - MRI brain did not show any acute stroke - Symptoms most likely secondary to recommendations about stroke symptoms due to hypoglycemia, infection, toxic-metabolic causes  Recommendations: - Will get EEG to look for ictal-interictal abnormality although low suspicion - Will defer workup for medical causes of altered mental status as well as management of medical comorbidities like hypoglycemia, toxic metabolic causes, looking for source of infection to primary team - Goal blood pressure: Normotension - Continue aspirin  81 mg daily -PT/OT - Continue to follow-up with neurology as an outpatient   Thank you for allowing us  to participate in the  care of this patient. If you have any further questions, please contact  me or neurohospitalist.   Roxy Cordial Epilepsy [facility] neurohospitalist

## 2023-11-21 NOTE — ED Notes (Signed)
 Pt returned from MRI. No change.

## 2023-11-21 NOTE — ED Notes (Signed)
Teleneuro in progress. 

## 2023-11-21 NOTE — Procedures (Signed)
   HEMODIALYSIS TREATMENT NOTE:  HD was delayed for MRI and EEG.  Home HD pt with right upper arm AVF with buttonhole sites.  No blunt needles on campus.  No viable sites for cannulation with sharp needles that would not impact established tunnels.  AVF was cannulated in tunnels with 17g needles.  Immediate blood return noted.  Lines aspirated and flushed without resistance, but venous pressures were unstable and fluctuating, triggering TMP alarms and blood pump stop.  Above was d/w Dr. Christianne Cowper, on-call nephrologist. HD was stopped after 2.5 hours due to unstable pressures.  Will ask assistance of daughter, who normally cannulates pt, prior to next session.  Total run time: 2.5 hours Net UF: 500 ml.  ------------------------------------------------------------------------------------------- Addendum:  Spoke with pt's daughter, Cathleen Coach, who normally cannulates pt.  Cathleen Coach confirms use of short, blunt needles on venous site.  She will bring some to the hospital.  We will coordinate so Cathleen Coach can be present for next HD session.    Zeeva Courser, RN AP [facility]

## 2023-11-21 NOTE — TOC Progression Note (Signed)
 30 day note   Patient Details  Name: Jennifer [name] MRN: [id number] Date of Birth: Oct 25, 1955  Transition of Care [facility]) CM/SW Contact  Orelia Binet, RN Phone Number: 11/21/2023, 3:57 PM   To whom it May Concern: Please be advised that the above name patient will require a short-term nursing home stay- anticipated 30 days or less rehabilitation and strengthening. The plan is for return home.

## 2023-11-21 NOTE — Evaluation (Signed)
 Occupational Therapy Evaluation Patient Details Name: Jennifer [name] MRN: [id number] DOB: [birth year] Today's Date: 11/21/2023   History of Present Illness   Jennifer [name] is a 68 y.o. female with medical history significant for hypertension, type 2 diabetes mellitus, hypothyroidism, ESRD on hemodialysis, CAD, depression, anxiety, memory loss, and history of CVA with residual deficits who presents with acute onset of left-sided weakness and speech difficulty.     Patient fell today and when her daughter was trying to assist her back up, she noted that the patient had new left-sided weakness, confusion, and difficulty with her speech.  She had been in her usual state at approximately 5 PM. (per MD)     Clinical Impressions Pt lethargic for OT and PT co-evaluation initially. Increased arousal briefly while sitting at EOB and standing. Max A for bed mobility. Max to total assist for sit to stand with RW. L UE limited functional grasp and overall use. R UE also weak but with better grasp compared to L. Max to total assist for most ADL's at this time. No family present today to confirm living history. Pt following commands intermittently. Pt left in the bed with call bell within reach. Pt will benefit from continued OT in the hospital and recommended venue below to increase strength, balance, and endurance for safe ADL's.        If plan is discharge home, recommend the following:   A lot of help with walking and/or transfers;Two people to help with walking and/or transfers;A lot of help with bathing/dressing/bathroom;Assistance with cooking/housework;Assistance with feeding;Direct supervision/assist for medications management;Assist for transportation;Help with stairs or ramp for entrance     Functional Status Assessment   Patient has had a recent decline in their functional status and demonstrates the ability to make significant improvements in function in a reasonable and predictable amount of  time.     Equipment Recommendations   None recommended by OT              Precautions/Restrictions   Precautions Precautions: Fall Recall of Precautions/Restrictions: Impaired Restrictions Weight Bearing Restrictions Per Provider Order: No     Mobility Bed Mobility Overal bed mobility: Needs Assistance Bed Mobility: Supine to Sit     Supine to sit: Max assist, HOB elevated     General bed mobility comments: much assist; little active assist from pt till sitting    Transfers Overall transfer level: Needs assistance Equipment used: Rolling walker (2 wheels) Transfers: Sit to/from Stand Sit to Stand: Total assist, Max assist, +2 physical assistance           General transfer comment: +2 assist for brief sit to stand at EOB with RW; unsteady      Balance Overall balance assessment: Needs assistance Sitting-balance support: No upper extremity supported, Feet supported Sitting balance-Leahy Scale: Poor Sitting balance - Comments: poor to fair seated at EOB   Standing balance support: Bilateral upper extremity supported, During functional activity, Reliant on assistive device for balance Standing balance-Leahy Scale: Poor Standing balance comment: using RW                           ADL either performed or assessed with clinical judgement   ADL Overall ADL's : Needs assistance/impaired Eating/Feeding: Moderate assistance;Sitting   Grooming: Moderate assistance;Sitting;Maximal assistance   Upper Body Bathing: Maximal assistance;Sitting   Lower Body Bathing: Total assistance;Maximal assistance;Bed level   Upper Body Dressing : Maximal assistance;Moderate assistance;Sitting   Lower Body  Dressing: Maximal assistance;Total assistance;Bed level Lower Body Dressing Details (indicate cue type and reason): assisted to don socks while supine in bed Toilet Transfer: Maximal assistance;Total assistance;+2 for physical assistance;Rolling walker (2  wheels);Stand-pivot Statistician Details (indicate cue type and reason): simulated via sit to stand at EOB with +2 assist Toileting- Clothing Manipulation and Hygiene: Total assistance;Bed level               Vision Baseline Vision/History: 1 Wears glasses (per chart) Ability to See in Adequate Light: 2 Moderately impaired Patient Visual Report: Other (comment) (R eye red) Vision Assessment?: Vision impaired- to be further tested in functional context Additional Comments: difficult to assess at this time with pt's current cognitive level     Perception Perception: Not tested       Praxis Praxis: Not tested       Pertinent Vitals/Pain Pain Assessment Pain Assessment: Faces Faces Pain Scale: Hurts a little bit Pain Location: B shoulder with P/ROM Pain Descriptors / Indicators: Moaning Pain Intervention(s): Limited activity within patient's tolerance, Monitored during session, Repositioned     Extremity/Trunk Assessment Upper Extremity Assessment Upper Extremity Assessment: RUE deficits/detail;LUE deficits/detail;Difficult to assess due to impaired cognition RUE Deficits / Details: limited to 50% P/ROM shoulder flexion. 3+/5 grip. RUE Coordination: decreased fine motor;decreased gross motor LUE Deficits / Details: ~50% P/ROM for shoulder flexion; 2-/5 grip strength. Difficult to fully assess. moderate tone for shoulder P/ROM; min to mod town elbow P/ROM. LUE Coordination: decreased fine motor;decreased gross motor   Lower Extremity Assessment Lower Extremity Assessment: Defer to PT evaluation   Cervical / Trunk Assessment Cervical / Trunk Assessment: Kyphotic   Communication Communication Communication: Impaired Factors Affecting Communication: Difficulty expressing self   Cognition Arousal: Lethargic Behavior During Therapy: Flat affect Cognition: No family/caregiver present to determine baseline             OT - Cognition Comments: Intermittent response  to questions; minimally verbal                 Following commands: Impaired Following commands impaired: Follows one step commands inconsistently     Cueing  General Comments   Cueing Techniques: Verbal cues;Tactile cues                 Home Living Family/patient expects to be discharged to:: Private residence Living Arrangements: Spouse/significant other;Children (daughter) Available Help at Discharge: Family;Available 24 hours/day Type of Home: House Home Access: Stairs to enter;Ramped entrance Entrance Stairs-Number of Steps: 5-6 Entrance Stairs-Rails: Can reach both Home Layout: One level     Bathroom Shower/Tub: Chief Strategy Officer: Standard Bathroom Accessibility: Yes   Home Equipment: Agricultural consultant (2 wheels);Cane - single point;BSC/3in1;Shower seat;Hospital bed;Wheelchair - manual;Grab bars - tub/shower   Additional Comments: per chart      Prior Functioning/Environment Prior Level of Function : Needs assist;History of Falls (last six months);Patient poor historian/Family not available       Physical Assist : ADLs (physical);Mobility (physical) Mobility (physical): Transfers;Gait;Stairs ADLs (physical): Feeding;Grooming;Bathing;Dressing;Toileting;IADLs Mobility Comments: SPT only per nurse report and pt confirmation ADLs Comments: Assist for all ADL's per chart    OT Problem List: Decreased range of motion;Decreased strength;Decreased activity tolerance;Impaired balance (sitting and/or standing);Impaired vision/perception;Decreased coordination;Decreased cognition;Decreased safety awareness;Impaired tone;Impaired UE functional use;Pain   OT Treatment/Interventions: Self-care/ADL training;Therapeutic exercise;Neuromuscular education;Energy conservation;DME and/or AE instruction;Therapeutic activities;Patient/family education;Visual/perceptual remediation/compensation;Cognitive remediation/compensation;Balance training      OT  Goals(Current goals can be found in the care plan section)   Acute Rehab OT  Goals Patient Stated Goal: no objection to rehab today OT Goal Formulation: With patient Time For Goal Achievement: 12/05/23 Potential to Achieve Goals: Good   OT Frequency:  Min 2X/week    Co-evaluation PT/OT/SLP Co-Evaluation/Treatment: Yes Reason for Co-Treatment: To address functional/ADL transfers;Complexity of the patient's impairments (multi-system involvement)   OT goals addressed during session: ADL's and self-care      AM-PAC OT "6 Clicks" Daily Activity     Outcome Measure Help from another person eating meals?: A Lot Help from another person taking care of personal grooming?: A Lot Help from another person toileting, which includes using toliet, bedpan, or urinal?: A Lot Help from another person bathing (including washing, rinsing, drying)?: A Lot Help from another person to put on and taking off regular upper body clothing?: A Lot Help from another person to put on and taking off regular lower body clothing?: A Lot 6 Click Score: 12   End of Session Equipment Utilized During Treatment: Rolling walker (2 wheels);Gait belt  Activity Tolerance: Patient tolerated treatment well Patient left: in bed;with call bell/phone within reach  OT Visit Diagnosis: Unsteadiness on feet (R26.81);Other abnormalities of gait and mobility (R26.89);Muscle weakness (generalized) (M62.81);History of falling (Z91.81);Other symptoms and signs involving the nervous system (R29.898);Cognitive communication deficit (R41.841);Hemiplegia and hemiparesis Symptoms and signs involving cognitive functions: Cerebral infarction Hemiplegia - Right/Left: Left Hemiplegia - caused by: Cerebral infarction                Time: 4098-1191 OT Time Calculation (min): 19 min Charges:  OT General Charges $OT Visit: 1 Visit OT Evaluation $OT Eval Moderate Complexity: 1 Mod  Shawanda Sievert OT, MOT   Thurnell Floss 11/21/2023, 9:33 AM

## 2023-11-21 NOTE — NC FL2 (Signed)
 [place]  MEDICAID [id number] LEVEL OF CARE FORM     IDENTIFICATION  Patient Name: Jennifer [name] Birthdate: 08-Feb-1956 Sex: female Admission Date (Current Location): 11/20/2023  [facility] and [place] Number:  [employer] and Address:  [facility],  [address], [place] [address]      Provider Number: [id number]  Attending Physician Name and Address:  Cornelius Dill, DO  Relative Name and Phone Number:  Juliana, Boling (Daughter)  [phone]    Current Level of Care: Hospital Recommended Level of Care: Skilled Nursing Facility Prior Approval Number:    Date Approved/Denied:   PASRR Number: Pending  Discharge Plan: SNF    Current Diagnoses: Patient Active Problem List   Diagnosis Date Noted   Acute left-sided weakness 11/20/2023   Abnormal urine odor 05/30/2023   Recurrent strokes (HCC) 05/20/2023   Anemia of chronic renal failure 12/19/2022   (HFpEF) heart failure with preserved ejection fraction (HCC) 11/25/2022   Auditory hallucination 07/26/2022   Carotid artery stenosis, asymptomatic, left 05/04/2022   Anemia of chronic disease 03/23/2022   Hyponatremia 03/23/2022   Hyperkalemia 03/23/2022   Dehydration 03/23/2022   Chronic diastolic CHF (congestive heart failure) (HCC) 03/23/2022   Closed left ankle fracture 01/26/2022   Type 2 diabetes mellitus with hyperglycemia (HCC) 01/07/2022   Acute kidney injury superimposed on chronic kidney disease (HCC) 11/19/2021   Frequent urination 07/13/2021   Change in mental status 07/13/2021   Gait abnormality 07/13/2021   Memory loss 07/13/2021   Abnormal urinalysis 07/13/2021   ESRD (end stage renal disease) (HCC) 07/13/2021   Normocytic anemia 07/13/2021   Diabetic nephropathy (HCC) 05/28/2020   Diabetic retinopathy (HCC) 05/28/2020   Presence of insulin  pump (external) (internal) 05/28/2020   Diabetic macular edema of right eye with proliferative retinopathy associated with type 2 diabetes  mellitus (HCC) 04/16/2020   Macular pucker, right eye 04/16/2020   Vitreous hemorrhage of left eye (HCC) 04/16/2020   Pre-op examination 03/13/2020   Chronic venous insufficiency 02/06/2020   Constipation 05/28/2019   S/P TKR (total knee replacement), right 03/23/2019 05/07/2019   Primary osteoarthritis of right knee 04/02/2019   Unilateral primary osteoarthritis, right knee    Pedal edema 02/25/2019   Acute vertigo with vomiting and inability to stand 10/08/2018   Uncontrolled type 2 diabetes mellitus with hyperglycemia, with long-term current use of insulin  (HCC) 10/08/2018   Former smoker 10/08/2018   Carotid artery disease (HCC) 03/15/2018   History of CVA (cerebrovascular accident) 11/16/2017   Low back pain 10/26/2017   Screening mammogram, encounter for 09/18/2017   Coronary artery disease involving native coronary artery of native heart without angina pectoris 11/04/2015   NSTEMI (non-ST elevated myocardial infarction) (HCC) 11/03/2015   Leukocytosis 08/16/2010   Acquired hypothyroidism 07/06/2010   HLD (hyperlipidemia) 08/28/2008   Essential hypertension 11/13/2007   Proliferative diabetic retinopathy of left eye with macular edema associated with type 2 diabetes mellitus (HCC) 09/21/2006   Diabetic peripheral neuropathy (HCC) 09/21/2006   Obesity (BMI 30-39.9) 09/21/2006   Adjustment disorder with mixed anxiety and depressed mood 09/21/2006   CARPAL TUNNEL SYNDROME 09/21/2006   COPD (chronic obstructive pulmonary disease) (HCC) 09/21/2006   EDEMA 09/21/2006   MIGRAINES, HX OF 09/21/2006    Orientation RESPIRATION BLADDER Height & Weight     Self, Place  Normal Incontinent Weight: 93.1 kg Height:  5\' 7"  (170.2 cm)  BEHAVIORAL SYMPTOMS/MOOD NEUROLOGICAL BOWEL NUTRITION STATUS      Continent Diet (See DC summary)  AMBULATORY STATUS  COMMUNICATION OF NEEDS Skin   Extensive Assist Verbally Bruising, Skin abrasions                       Personal Care Assistance  Level of Assistance  Bathing, Dressing, Feeding Bathing Assistance: Maximum assistance Feeding assistance: Limited assistance Dressing Assistance: Maximum assistance     Functional Limitations Info  Sight, Hearing, Speech Sight Info: Impaired Hearing Info: Impaired Speech Info: Adequate    SPECIAL CARE FACTORS FREQUENCY  PT (By licensed PT)     PT Frequency: 5 times a week              Contractures Contractures Info: Present    Additional Factors Info  Code Status Code Status Info: FULL             Current Medications (11/21/2023):  This is the current hospital active medication list Current Facility-Administered Medications  Medication Dose Route Frequency Provider Last Rate Last Admin    stroke: early stages of recovery book   Does not apply Once Opyd, Timothy S, MD       acetaminophen  (TYLENOL ) tablet 650 mg  650 mg Oral Q4H PRN Opyd, Timothy S, MD       Or   acetaminophen  (TYLENOL ) 160 MG/5ML solution 650 mg  650 mg Per Tube Q4H PRN Opyd, Timothy S, MD       Or   acetaminophen  (TYLENOL ) suppository 650 mg  650 mg Rectal Q4H PRN Opyd, Timothy S, MD       aspirin  chewable tablet 81 mg  81 mg Oral Daily Opyd, Timothy S, MD       atorvastatin  (LIPITOR ) tablet 80 mg  80 mg Oral q1800 Opyd, Timothy S, MD       Chlorhexidine  Gluconate Cloth 2 % PADS 6 each  6 each Topical Q0600 Charley Constable, MD       dextrose  5 %-0.9 % sodium chloride  infusion   Intravenous Continuous Mason Sole, Pratik D, DO 40 mL/hr at 11/21/23 1248 New Bag at 11/21/23 1248   heparin  injection 5,000 Units  5,000 Units Subcutaneous Q8H Opyd, Timothy S, MD   5,000 Units at 11/21/23 0602   insulin  aspart (novoLOG ) injection 0-6 Units  0-6 Units Subcutaneous Q4H Opyd, Timothy S, MD   1 Units at 11/21/23 0030   levothyroxine  (SYNTHROID ) tablet 175 mcg  175 mcg Oral Q0600 Opyd, Timothy S, MD       sevelamer carbonate (RENVELA) packet 0.8 g  0.8 g Oral TID WC Opyd, Timothy S, MD       tobramycin (TOBREX) 0.3  % ophthalmic solution 1 drop  1 drop Right Eye Q2H Opyd, Timothy S, MD   1 drop at 11/21/23 1211     Discharge Medications: Please see discharge summary for a list of discharge medications.  Relevant Imaging Results:  Relevant Lab Results:   Additional Information SS# [id number]  Orelia Binet, RN

## 2023-11-21 NOTE — Progress Notes (Signed)
 Eeg complete, results are pending

## 2023-11-21 NOTE — ED Notes (Signed)
 Knot and bruising noted to right lower shin area. Nail bed with dried blood to left great toe. Right eye red and swollen with light exudate.

## 2023-11-21 NOTE — ED Notes (Signed)
 CBG 85

## 2023-11-21 NOTE — Care Management Obs Status (Signed)
 MEDICARE OBSERVATION STATUS NOTIFICATION   Patient Details  Name: Jennifer [name] MRN: [id number] Date of Birth: 11-11-1955   Medicare Observation Status Notification Given:  Yes (signature obtained by daughter Jaquitta Dupriest, copy provided)    Neila Bally 11/21/2023, 1:51 PM

## 2023-11-21 NOTE — Progress Notes (Signed)
  Echocardiogram 2D Echocardiogram has been performed.  Fain Home RDCS 11/21/2023, 1:16 PM

## 2023-11-21 NOTE — Evaluation (Signed)
 Clinical/Bedside Swallow Evaluation Patient Details  Name: Jennifer [name] MRN: [id number] Date of Birth: 06/18/1955  Today's Date: 11/21/2023 Time: SLP Start Time (ACUTE ONLY): 1130 SLP Stop Time (ACUTE ONLY): 1150 SLP Time Calculation (min) (ACUTE ONLY): 20 min  Past Medical History:  Past Medical History:  Diagnosis Date   CAD (coronary artery disease)    cath 5/23 100% dist RCA lesion treated with 2 overlapping Integrity Resolute DES ( 2.25x66mm, 2.25x82mm), 60% mid RCA treated medically, 99% OM2 not amenable to PCI, 70% D1 lesion, EF normal   Hypercholesteremia    Hypertension    Hypothyroidism    Kidney disease    Renal disorder    Stroke (HCC) 10/2018   Thyroid  disease    Type 2 diabetes mellitus (HCC) 10/08/2018   Past Surgical History:  Past Surgical History:  Procedure Laterality Date   ABDOMINAL HYSTERECTOMY     AV FISTULA PLACEMENT Right 06/28/2022   Procedure: RIGHT ARM ARTERIOVENOUS (AV) FISTULA CREATION;  Surgeon: Mayo Speck, MD;  Location: [facility];  Service: Vascular;  Laterality: Right;   CARDIAC CATHETERIZATION N/A 11/03/2015   Procedure: Left Heart Cath and Coronary Angiography;  Surgeon: Arleen Lacer, MD;  Location: [facility] INVASIVE CV LAB;  Service: Cardiovascular;  Laterality: N/A;   CARDIAC CATHETERIZATION N/A 11/03/2015   Procedure: Coronary Stent Intervention;  Surgeon: Arleen Lacer, MD;  Location: [facility] INVASIVE CV LAB;  Service: Cardiovascular;  Laterality: N/A;   CARPAL TUNNEL RELEASE     CORONARY ANGIOPLASTY     1 stent   FISTULA SUPERFICIALIZATION Right 08/16/2022   Procedure: RIGHT ARTERIOVENOUS FISTULA SUPERFICIALIZATION;  Surgeon: Mayo Speck, MD;  Location: [facility];  Service: Vascular;  Laterality: Right;   TOTAL KNEE ARTHROPLASTY Right 04/02/2019   Procedure: TOTAL KNEE ARTHROPLASTY;  Surgeon: Darrin Emerald, MD;  Location: [facility];  Service: Orthopedics;  Laterality: Right;   HPI:  Pt is a 68 y.o. 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 Code Stroke called and pt admitted to ED for observation. Pt known to ST service during Dec 2024 [facility] admission, presented with oral phase dysphagia and dysarthria at that time.   MRI 11/21/22: 1. No acute intracranial abnormality. 2. Expected evolution of small brainstem hemorrhage since December. And otherwise stable severe underlying chronic small vessel disease.  CT Head 11/20/23: 1. No acute intracranial abnormality.2. ASPECTS is 10. 3. Atrophy with advanced chronic microvascular ischemic disease,with chronic right basal ganglia and left cerebellar infarcts.  MBSS 05/22/23: Pt has an oral more than pharyngeal dysphagia. She does not have full labial seal on her R side and does best getting liquids via straw, both in terms of getting more liquid into her mouth as well as keeping it in there. She has relatively good lingual hold and posterior transit, although movement becomes more repetitive and disorganized as boluses become more solid. She does not fully masticate a small piece of cracker, with most of it coming back out of her oral cavity. Oral residue is pretty mild across consistencies but she cannot clear a barium tablet, which she then expectorated. Her pharyngeal strength and clearance are intact, but she has intermittent delays in initiating her swallow wtih solid consistencies more than liquids. No aspiration occurs. Recommend starting Dys 1 (puree) diet and thin liquids.  Assessment / Plan / Recommendation  Clinical Impression  Pt NPO at time of eval, failed nursing screen. Pt's daughter reports baseline diet is reg/thin. States her mom had dysphagia for ~1 month after her CVA in 2024 but that it resolved.  Oral mech revealed R facial droop, reduced R  labial strength, reduced lingual strength bilaterally, and poor dentition with only a few teeth on bottom. Pt wears dentures but they are not present.  SLP administered PO trials of ice via spoon, thin water via tsp/cup/straw, puree applesauce, and regular graham cracker. Anterior spillage on R with thin via cup. Oral holding noted with solids. No oral cavity residue observed. Mastication was prolonged and inefficient likely d/t dentition and pt had difficulty biting off a piece of graham cracker. Multiple swallows with solids and suspected delayed swallow initiation. No overt s/sx of aspiration observed across consistencies and with thorough challenging with thin liquids.   Pt presents with a moderate oral phase and suspected pharyngeal phase dysphagia characterized by reduced labial seal, prolonged and inefficient mastication, reduced lingual and labial strength, oral holding, and multiple swallows with solids. Rec Dys 1 with thin liquids, meds crushed in puree (no applesauce per pt preference). ST services indicated while acute to determine potential for diet texture upgrade. Pt may also benefit from SLE if time permits d/t noted communication deficits.   SLP Visit Diagnosis: Dysphagia, unspecified (R13.10)    Aspiration Risk  Risk for inadequate nutrition/hydration;Moderate aspiration risk    Diet Recommendation Dysphagia 1 (Puree);Thin liquid    Liquid Administration via: Straw;Cup Medication Administration: Crushed with puree Supervision: Staff to assist with self feeding Compensations: Slow rate;Small sips/bites;Monitor for anterior loss Postural Changes: Seated upright at 90 degrees;Remain upright for at least 30 minutes after po intake    Other  Recommendations Oral Care Recommendations: Oral care BID     Assistance Recommended at Discharge    Functional Status Assessment Patient has had a recent decline in their functional status and demonstrates the ability to make significant  improvements in function in a reasonable and predictable amount of time.  Frequency and Duration min 1 x/week  1 week       Prognosis Prognosis for improved oropharyngeal function: Good Barriers to Reach Goals: Cognitive deficits;Language deficits      Swallow Study   General Date of Onset: 11/20/23 HPI: Pt is a 68 y.o. 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 Pt known to ST service during Dec 2024 [facility] admission, presented with oral phase dysphagia and dysarthria at that time. Previous MBSS 05/22/23. Type of Study: Bedside Swallow Evaluation Previous Swallow Assessment: MBSS 05/22/23 Diet Prior to this Study: NPO Temperature Spikes Noted: No Respiratory Status: Room air History of Recent Intubation: No Behavior/Cognition: Confused;Pleasant mood;Cooperative Oral Cavity Assessment: Other (comment) (Largely edentulous) Oral Care Completed by SLP: No Oral Cavity - Dentition: Dentures, not available Vision: Functional for self-feeding Self-Feeding Abilities: Total assist Patient Positioning: Upright in bed Baseline Vocal Quality: Normal Volitional Cough: Strong Volitional Swallow: Able to elicit    Oral/Motor/Sensory Function Overall Oral Motor/Sensory Function: Mild impairment Facial ROM: Reduced right Facial Symmetry: Abnormal symmetry right Facial Strength: Reduced right Facial Sensation: Within Functional Limits Lingual ROM: Within Functional Limits Lingual Symmetry: Abnormal symmetry left (deviates to R) Lingual Strength: Reduced Velum: Other (comment) (unable to visualize) Mandible:  Within Functional Limits   Ice Chips Ice chips: Within functional limits Presentation: Spoon   Thin Liquid Thin Liquid: Within functional  limits Presentation: Cup;Spoon;Straw    Nectar Thick Nectar Thick Liquid: Not tested   Honey Thick Honey Thick Liquid: Not tested   Puree Puree: Within functional limits Presentation: Spoon   Solid     Solid: Impaired Oral Phase Impairments: Reduced labial seal;Impaired mastication Oral Phase Functional Implications: Right anterior spillage;Impaired mastication;Oral holding Pharyngeal Phase Impairments: Suspected delayed Swallow;Multiple swallows      Caretha Chapel, MA CCC-SLP Speech-Language Pathologist 11/21/2023,12:17 PM

## 2023-11-21 NOTE — ED Notes (Signed)
 Dr Mason Sole at bedside and [facility] called for d5NS fluids

## 2023-11-21 NOTE — Plan of Care (Signed)
  Problem: Acute Rehab PT Goals(only PT should resolve) Goal: Pt Will Go Supine/Side To Sit Outcome: Progressing Flowsheets (Taken 11/21/2023 1444) Pt will go Supine/Side to Sit: with moderate assist Goal: Patient Will Transfer Sit To/From Stand Outcome: Progressing Flowsheets (Taken 11/21/2023 1444) Patient will transfer sit to/from stand: with moderate assist Goal: Pt Will Transfer Bed To Chair/Chair To Bed Outcome: Progressing Flowsheets (Taken 11/21/2023 1444) Pt will Transfer Bed to Chair/Chair to Bed: with mod assist Goal: Pt Will Ambulate Outcome: Progressing Flowsheets (Taken 11/21/2023 1444) Pt will Ambulate:  10 feet  with moderate assist  with rolling walker   2:45 PM, 11/21/23 Walton Guppy, MPT Physical Therapist with [facility] [phone] office [phone] mobile phone

## 2023-11-21 NOTE — Plan of Care (Signed)
  Problem: Acute Rehab OT Goals (only OT should resolve) Goal: Pt. Will Perform Eating Flowsheets (Taken 11/21/2023 0936) Pt Will Perform Eating:  with set-up  sitting Goal: Pt. Will Perform Grooming Flowsheets (Taken 11/21/2023 0936) Pt Will Perform Grooming:  with supervision  sitting Goal: Pt. Will Perform Upper Body Dressing Flowsheets (Taken 11/21/2023 0936) Pt Will Perform Upper Body Dressing:  with min assist  sitting Goal: Pt. Will Transfer To Toilet Flowsheets (Taken 11/21/2023 [phone]) Pt Will Transfer to Toilet:  with mod assist  stand pivot transfer Goal: Pt. Will Perform Toileting-Clothing Manipulation Flowsheets (Taken 11/21/2023 0936) Pt Will Perform Toileting - Clothing Manipulation and hygiene:  with mod assist  bed level Goal: Pt/Caregiver Will Perform Home Exercise Program Flowsheets (Taken 11/21/2023 [phone]) Pt/caregiver will Perform Home Exercise Program:  Increased ROM  Increased strength  Both right and left upper extremity  With minimal assist  Caidance Sybert OT, MOT

## 2023-11-21 NOTE — Consult Note (Signed)
 [facility] Renal Consultation Note    Indication for Consultation:  Management of ESRD/hemodialysis; anemia, hypertension/volume and secondary hyperparathyroidism  HPI: LAJUAN [name] is a 68 y.o. female with a PMH significant for HTN, DM, HLD, CAD, h/o CVA, hypothyroidism, dementia, and ESRD (on HHD MTThFr) who presented to [facility] ED via EMS after developing left sided weakness and slurred speech around 5 pm yesterday.  Pt has right facial droop at baseline and had altered mental status.  In the ED, Temp 98.2, Bp 129/54, HR 88, RR 16, SpO2 93%.  Hgb 9.9, Na 130, Cl 94, BUN 59, Cr 5.9, Ca 8, alb 2.3.  Neurology consulted and allowing permissive HTN.  CTA without significant occlusion or emergent finding.  MRI pending.  We were consulted to provide dialysis during her hospitalization.  Her daughter is at the bedside and reports that she is now oriented and speaking, although she remained nonverbal during the interview.  Past Medical History:  Diagnosis Date   CAD (coronary artery disease)    cath 5/23 100% dist RCA lesion treated with 2 overlapping Integrity Resolute DES ( 2.25x93mm, 2.25x59mm), 60% mid RCA treated medically, 99% OM2 not amenable to PCI, 70% D1 lesion, EF normal   Hypercholesteremia    Hypertension    Hypothyroidism    Kidney disease    Renal disorder    Stroke (HCC) 10/2018   Thyroid  disease    Type 2 diabetes mellitus (HCC) 10/08/2018   Past Surgical History:  Procedure Laterality Date   ABDOMINAL HYSTERECTOMY     AV FISTULA PLACEMENT Right 06/28/2022   Procedure: RIGHT ARM ARTERIOVENOUS (AV) FISTULA CREATION;  Surgeon: Mayo Speck, MD;  Location: [facility];  Service: Vascular;  Laterality: Right;   CARDIAC CATHETERIZATION N/A 11/03/2015   Procedure: Left Heart Cath and Coronary Angiography;  Surgeon: Arleen Lacer, MD;  Location: [facility] INVASIVE CV LAB;  Service: Cardiovascular;  Laterality: N/A;   CARDIAC CATHETERIZATION N/A 11/03/2015   Procedure: Coronary  Stent Intervention;  Surgeon: Arleen Lacer, MD;  Location: [facility] INVASIVE CV LAB;  Service: Cardiovascular;  Laterality: N/A;   CARPAL TUNNEL RELEASE     CORONARY ANGIOPLASTY     1 stent   FISTULA SUPERFICIALIZATION Right 08/16/2022   Procedure: RIGHT ARTERIOVENOUS FISTULA SUPERFICIALIZATION;  Surgeon: Mayo Speck, MD;  Location: [facility];  Service: Vascular;  Laterality: Right;   TOTAL KNEE ARTHROPLASTY Right 04/02/2019   Procedure: TOTAL KNEE ARTHROPLASTY;  Surgeon: Darrin Emerald, MD;  Location: [facility];  Service: Orthopedics;  Laterality: Right;   Family History:   Family History  Problem Relation Age of Onset   Heart disease Mother    Stroke Sister    Heart attack Brother    Breast cancer Neg Hx    Social History:  reports that she quit smoking about 2 years ago. Her smoking use included cigarettes. She has never used smokeless tobacco. She reports that she does not drink alcohol  and does not use drugs. Allergies  Allergen Reactions   Metformin Diarrhea   Tenecteplase  Other (See Comments)    Unknown    Prior to Admission medications   Medication Sig Start Date End Date Taking? Authorizing Provider  aspirin  81 MG chewable tablet Chew 1 tablet (81 mg total) by mouth daily. 05/24/23  Yes de Thayne Fine, Cortney E, NP  atorvastatin  (LIPITOR ) 80 MG tablet TAKE 1 TABLET BY MOUTH ONCE DAILY AT  6  IN  THE  EVENING 09/06/23  Yes Mallipeddi, Kennyth Pean, MD  buPROPion  (WELLBUTRIN  SR) 150 MG 12 hr tablet Take 1 tablet by mouth twice daily 08/02/23  Yes [name], Manley Seeds, MD  carvedilol  (COREG ) 12.5 MG tablet Take 1 tablet (12.5 mg total) by mouth daily. On non-dialysis days Patient taking differently: Take 12.5 mg by mouth at bedtime. On non-dialysis days 05/24/23  Yes de Thayne Fine, Cortney E, NP  doxycycline  (MONODOX ) 100 MG capsule Take 100 mg by mouth 2 (two) times daily. For a month for eye infection   Yes [provider]  DULoxetine  (CYMBALTA ) 60 MG capsule Take 1 capsule by mouth  once daily 07/19/23  Yes [name], Manley Seeds, MD  ferrous sulfate  325 (65 FE) MG tablet Take 325 mg by mouth as needed (given when needed at dialysis).   Yes [provider]  furosemide  (LASIX ) 80 MG tablet Take 80 mg by mouth daily. On non-dialysis days 10/27/22  Yes [provider]  insulin  lispro protamine-lispro (HUMALOG  75/25 MIX) (75-25) 100 UNIT/ML SUSP injection Inject 10-25 Units into the skin 3 (three) times daily as needed (high bs). Inject 25 units with breakfast, 10 units at lunch, 25 units at dinner   Yes [provider]  iron sucrose 100 mg in sodium chloride  0.9 % 100 mL Inject 100 mg into the vein as needed (iron-deficiency anemia). Venofer   Yes [provider]  levothyroxine  (SYNTHROID ) 175 MCG tablet Take 175 mcg by mouth daily before breakfast.   Yes [provider]  lidocaine -prilocaine  (EMLA ) cream Apply 1 Application topically 4 (four) times a week.   Yes [provider]  memantine  (NAMENDA ) 10 MG tablet Take 1 tablet (10 mg total) by mouth 2 (two) times daily. 06/22/23  Yes Sethi, Pramod S, MD  Methoxy PEG-Epoetin Beta (MIRCERA) 200 MCG/0.3ML SOSY Inject 200 mcg as directed as needed (low RBCs). Mircera   Yes [provider]  nitroGLYCERIN  (NITROSTAT ) 0.4 MG SL tablet Place 0.4 mg under the tongue every 5 (five) minutes as needed for chest pain.   Yes [provider]  sevelamer carbonate (RENVELA) 0.8 g PACK packet Take 0.8 g by mouth 3 (three) times daily with meals.   Yes [provider]  tobramycin (TOBREX) 0.3 % ophthalmic solution Place 1 drop into the right eye every 2 (two) hours.   Yes [provider]  BD PEN NEEDLE NANO 2ND GEN 32G X 4 MM MISC Inject into the skin 3 (three) times daily. 07/22/21   [provider]   Current Facility-Administered Medications  Medication Dose Route Frequency Provider Last Rate Last Admin    stroke: early stages of recovery book   Does not apply Once  Opyd, Timothy S, MD       acetaminophen  (TYLENOL ) tablet 650 mg  650 mg Oral Q4H PRN Opyd, Timothy S, MD       Or   acetaminophen  (TYLENOL ) 160 MG/5ML solution 650 mg  650 mg Per Tube Q4H PRN Opyd, Timothy S, MD       Or   acetaminophen  (TYLENOL ) suppository 650 mg  650 mg Rectal Q4H PRN Opyd, Timothy S, MD       aspirin  chewable tablet 81 mg  81 mg Oral Daily Opyd, Timothy S, MD       atorvastatin  (LIPITOR ) tablet 80 mg  80 mg Oral q1800 Opyd, Timothy S, MD       dextrose  5 %-0.9 % sodium chloride  infusion   Intravenous Continuous Mason Sole, Pratik D, DO 40 mL/hr at 11/21/23 0800 New Bag at 11/21/23 0800  doxycycline  (VIBRA -TABS) tablet 100 mg  100 mg Oral BID Opyd, Timothy S, MD       heparin  injection 5,000 Units  5,000 Units Subcutaneous Q8H Opyd, Timothy S, MD   5,000 Units at 11/21/23 0602   insulin  aspart (novoLOG ) injection 0-6 Units  0-6 Units Subcutaneous Q4H Opyd, Timothy S, MD   1 Units at 11/21/23 0030   levothyroxine  (SYNTHROID ) tablet 175 mcg  175 mcg Oral Q0600 Opyd, Timothy S, MD       sevelamer carbonate (RENVELA) packet 0.8 g  0.8 g Oral TID WC Opyd, Timothy S, MD       tobramycin (TOBREX) 0.3 % ophthalmic solution 1 drop  1 drop Right Eye Q2H Opyd, Timothy S, MD   1 drop at 11/21/23 1610   Current Outpatient Medications  Medication Sig Dispense Refill   aspirin  81 MG chewable tablet Chew 1 tablet (81 mg total) by mouth daily. 30 tablet 2   atorvastatin  (LIPITOR ) 80 MG tablet TAKE 1 TABLET BY MOUTH ONCE DAILY AT  6  IN  THE  EVENING 90 tablet 3   buPROPion  (WELLBUTRIN  SR) 150 MG 12 hr tablet Take 1 tablet by mouth twice daily 180 tablet 1   carvedilol  (COREG ) 12.5 MG tablet Take 1 tablet (12.5 mg total) by mouth daily. On non-dialysis days (Patient taking differently: Take 12.5 mg by mouth at bedtime. On non-dialysis days) 30 tablet 1   doxycycline  (MONODOX ) 100 MG capsule Take 100 mg by mouth 2 (two) times daily. For a month for eye infection     DULoxetine  (CYMBALTA ) 60 MG  capsule Take 1 capsule by mouth once daily 90 capsule 2   ferrous sulfate  325 (65 FE) MG tablet Take 325 mg by mouth as needed (given when needed at dialysis).     furosemide  (LASIX ) 80 MG tablet Take 80 mg by mouth daily. On non-dialysis days     insulin  lispro protamine-lispro (HUMALOG  75/25 MIX) (75-25) 100 UNIT/ML SUSP injection Inject 10-25 Units into the skin 3 (three) times daily as needed (high bs). Inject 25 units with breakfast, 10 units at lunch, 25 units at dinner     iron sucrose 100 mg in sodium chloride  0.9 % 100 mL Inject 100 mg into the vein as needed (iron-deficiency anemia). Venofer     levothyroxine  (SYNTHROID ) 175 MCG tablet Take 175 mcg by mouth daily before breakfast.     lidocaine -prilocaine  (EMLA ) cream Apply 1 Application topically 4 (four) times a week.     memantine  (NAMENDA ) 10 MG tablet Take 1 tablet (10 mg total) by mouth 2 (two) times daily. 90 tablet 1   Methoxy PEG-Epoetin Beta (MIRCERA) 200 MCG/0.3ML SOSY Inject 200 mcg as directed as needed (low RBCs). Mircera     nitroGLYCERIN  (NITROSTAT ) 0.4 MG SL tablet Place 0.4 mg under the tongue every 5 (five) minutes as needed for chest pain.     sevelamer carbonate (RENVELA) 0.8 g PACK packet Take 0.8 g by mouth 3 (three) times daily with meals.     tobramycin (TOBREX) 0.3 % ophthalmic solution Place 1 drop into the right eye every 2 (two) hours.     BD PEN NEEDLE NANO 2ND GEN 32G X 4 MM MISC Inject into the skin 3 (three) times daily.     Labs: Basic Metabolic Panel: Recent Labs  Lab 11/20/23 2141 11/20/23 2230 11/21/23 0548  NA 131* 130* 134*  K 4.2 3.9 4.2  CL 93* 94* 98  CO2 25  --  24  GLUCOSE 207* 202* 61*  BUN 60* 59* 70*  CREATININE 5.71* 5.90* 6.10*  CALCIUM  8.0*  --  8.1*   Liver Function Tests: Recent Labs  Lab 11/20/23 2141  AST 18  ALT 13  ALKPHOS 65  BILITOT 0.8  PROT 4.9*  ALBUMIN 2.3*   No results for input(s): "LIPASE", "AMYLASE" in the last 168 hours. No results for input(s):  "AMMONIA" in the last 168 hours. CBC: Recent Labs  Lab 11/20/23 2141 11/20/23 2230 11/21/23 0548  WBC 8.6  --  10.7*  NEUTROABS 6.5  --   --   HGB 10.2* 9.9* 10.4*  HCT 31.5* 29.0* 32.2*  MCV 97.5  --  96.1  PLT 239  --  180   Cardiac Enzymes: No results for input(s): "CKTOTAL", "CKMB", "CKMBINDEX", "TROPONINI" in the last 168 hours. CBG: Recent Labs  Lab 11/21/23 0316 11/21/23 0601 11/21/23 0638 11/21/23 0750 11/21/23 0857  GLUCAP 71 61* 138* 85 75   Iron Studies: No results for input(s): "IRON", "TIBC", "TRANSFERRIN", "FERRITIN" in the last 72 hours. Studies/Results: CT ANGIO HEAD NECK W WO CM (CODE STROKE) Result Date: 11/20/2023 CLINICAL DATA:  Initial evaluation for acute neuro deficit, stroke suspected. EXAM: CT ANGIOGRAPHY HEAD AND NECK WITH AND WITHOUT CONTRAST TECHNIQUE: Multidetector CT imaging of the head and neck was performed using the standard protocol during bolus administration of intravenous contrast. Multiplanar CT image reconstructions and MIPs were obtained to evaluate the vascular anatomy. Carotid stenosis measurements (when applicable) are obtained utilizing NASCET criteria, using the distal internal carotid diameter as the denominator. RADIATION DOSE REDUCTION: This exam was performed according to the departmental dose-optimization program which includes automated exposure control, adjustment of the mA and/or kV according to patient size and/or use of iterative reconstruction technique. CONTRAST:  75mL OMNIPAQUE  IOHEXOL  350 MG/ML SOLN COMPARISON:  Comparison made with head CT from earlier the same day as well as prior CTA from 05/20/2023. FINDINGS: CTA NECK FINDINGS Aortic arch: Visualized aortic arch within normal limits for caliber with standard 3 vessel morphology. Aortic atherosclerosis. No significant stenosis about the origin the great vessels. Right carotid system: Right common and internal carotid arteries are patent without dissection. Mild atheromatous  change about the right carotid bulb without hemodynamically significant greater than 50% stenosis. Left carotid system: Left common and internal carotid arteries are patent without dissection. Mild atheromatous change about the left carotid bulb without hemodynamically significant stenosis. Vertebral arteries: Both vertebral arteries arise from subclavian arteries. Vertebral arteries are patent without stenosis or dissection. Skeleton: No worrisome osseous lesions. Other neck: No other acute finding. Upper chest: No other acute finding. Review of the MIP images confirms the above findings CTA HEAD FINDINGS Anterior circulation: Atheromatous change about the carotid siphons with associated mild to moderate multifocal narrowing bilaterally. A1 segments patent bilaterally. Normal anterior communicating artery complex. Anterior cerebral arteries patent without significant stenosis. No M1 stenosis or occlusion. Distal MCA branches perfused and symmetric. Posterior circulation: Both V4 segments patent without stenosis. Both PICA patent at their origins. Basilar patent without stenosis. Superior cerebral arteries patent bilaterally. Both PCAs primarily supplied via the basilar. Atheromatous irregularity about the PCAs bilaterally the with associated mild to moderate bilateral P2 stenoses. PCAs remain patent to their distal aspects. Venous sinuses: Patent allowing for timing the contrast bolus. Anatomic variants: None significant.  No aneurysm. Review of the MIP images confirms the above findings IMPRESSION: 1. Negative CTA for large vessel occlusion or other emergent finding. 2. Intracranial atherosclerotic disease, most notably about the carotid  siphons and PCAs where there are associated mild to moderate multifocal stenoses. 3. Mild atheromatous change about the carotid bifurcations without significant stenosis. No hemodynamically significant stenosis within the neck. 4. Aortic Atherosclerosis (ICD10-I70.0). Results were  called by telephone at the time of interpretation on 11/20/2023 at 10:18 pm to provider [facility] , who verbally acknowledged these results. Electronically Signed   By: Virgia Griffins M.D.   On: 11/20/2023 22:19   CT HEAD CODE STROKE WO CONTRAST Result Date: 11/20/2023 CLINICAL DATA:  Code stroke. Initial evaluation for acute neuro deficit, stroke suspected. EXAM: CT HEAD WITHOUT CONTRAST TECHNIQUE: Contiguous axial images were obtained from the base of the skull through the vertex without intravenous contrast. RADIATION DOSE REDUCTION: This exam was performed according to the departmental dose-optimization program which includes automated exposure control, adjustment of the mA and/or kV according to patient size and/or use of iterative reconstruction technique. COMPARISON:  Prior study from 05/20/2023 FINDINGS: Brain: Generalized age-related cerebral atrophy. Patchy hypodensity involving the supratentorial cerebral white matter, consistent chronic small vessel ischemic disease, advanced in nature. Remote right basal ganglia lacunar infarct. Additional chronic left cerebellar infarct. No acute intracranial hemorrhage. No acute large vessel territory infarct. No mass lesion or midline shift. No hydrocephalus or extra-axial fluid collection. Vascular: No abnormal hyperdense vessel. Calcified atherosclerosis present about the skull base. Skull: Scalp soft tissues within normal limits.  Calvarium intact. Sinuses/Orbits: Globes and orbital soft tissues within normal limits. Small osteoma noted within the right ethmoidal air cells. Paranasal sinuses are largely clear. No significant mastoid effusion. Other: None. ASPECTS [facility] Stroke Program Early CT Score) - Ganglionic level infarction (caudate, lentiform nuclei, internal capsule, insula, M1-M3 cortex): 7 - Supraganglionic infarction (M4-M6 cortex): 3 Total score (0-10 with 10 being normal): 10 IMPRESSION: 1. No acute intracranial abnormality. 2. ASPECTS is  10. 3. Atrophy with advanced chronic microvascular ischemic disease, with chronic right basal ganglia and left cerebellar infarcts. Results were called by telephone at the time of interpretation on 11/20/2023 at 9:55 pm to provider [facility] , who verbally acknowledged these results. Electronically Signed   By: Virgia Griffins M.D.   On: 11/20/2023 22:00    ROS: Pertinent items are noted in HPI. Physical Exam: Vitals:   11/21/23 0645 11/21/23 0715 11/21/23 0745 11/21/23 0815  BP: (!) 120/47 121/60 (!) 112/52 (!) 122/50  Pulse: 75 75 74 76  Resp: 12 12 10 12   Temp:      TempSrc:      SpO2: 97% 96% 99% 96%  Weight:      Height:          Weight change:  No intake or output data in the 24 hours ending 11/21/23 1005 BP (!) 122/50   Pulse 76   Temp 98 F (36.7 C)   Resp 12   Ht 5\' 7"  (1.702 m)   Wt 95.1 kg   SpO2 96%   BMI 32.84 kg/m  General appearance: fatigued and slowed mentation Head: Normocephalic, without obvious abnormality, atraumatic, right facial droop Eyes: negative findings: lids and lashes normal, conjunctivae and sclerae normal, and corneas clear Resp: clear to auscultation bilaterally Cardio: regular rate and rhythm, S1, S2 normal, no murmur, click, rub or gallop GI: soft, non-tender; bowel sounds normal; no masses,  no organomegaly Extremities: extremities normal, atraumatic, no cyanosis or edema and RUE avf +T/B Dialysis Access: RUE AVF  DIALYSIS PRESCRIPTION  NxStage Hemodialysis Data Element Value  Order Date/Time September 19, 2023  Frequency 4X Week  Treatment Days  MonTueThuFri  Dialyzer/Cartridge CAR 172  Therapy Fluid (dialysate) 2.0 K 45 Lactate  Estimated Treatment Time 165 min  Volume per Treatment (Liters) 45 L  Dialysate Flow Rate 16.4 L/hr  Maximum Flow Fraction (%) 200%  Maximum Ultrafiltration Rate 1 ml/Kg/hr  Blood Flow Rate (mL/min) 400 mL/min  Estimated Dry Weight 90.5 kg  Dialysis Access Hemodialysis-AV Fistula-Transposed, Right  Upper Arm, Brachial Artery to Cephalic Vein Access Placed on June 28, 2022  Arterial Needle Size 15g1"  Venous Needle Size 15g1"   Micera 100 mcg SQ every 2 weeks (given 11/16/23)  Assessment/Plan:  Left sided weakness with slurred speech and AMS - Neuro following and given asa, permissive HTN.  MRI of brain pending.  Nothing acute on CT of head  ESRD -  normally has HD Sat/Sun/Tues/Wed at home.  Will plan for HD today.  Hypertension/volume  - UF gently given AMS and possible CVA.  Anemia  - stable and recently received micera  Metabolic bone disease -   continue with home meds  Nutrition -  renal diet, carb modified when cleared for po. DM - per primary  Benjamin Brands, MD [facility], [facility] 11/21/2023, 10:05 AM

## 2023-11-22 DIAGNOSIS — R531 Weakness: Secondary | ICD-10-CM | POA: Diagnosis not present

## 2023-11-22 LAB — CBC
HCT: 29.8 % — ABNORMAL LOW (ref 36.0–46.0)
Hemoglobin: 9.2 g/dL — ABNORMAL LOW (ref 12.0–15.0)
MCH: 30.1 pg (ref 26.0–34.0)
MCHC: 30.9 g/dL (ref 30.0–36.0)
MCV: 97.4 fL (ref 80.0–100.0)
Platelets: 218 10*3/uL (ref 150–400)
RBC: 3.06 MIL/uL — ABNORMAL LOW (ref 3.87–5.11)
RDW: 15.9 % — ABNORMAL HIGH (ref 11.5–15.5)
WBC: 8.5 10*3/uL (ref 4.0–10.5)
nRBC: 0 % (ref 0.0–0.2)

## 2023-11-22 LAB — RENAL FUNCTION PANEL
Albumin: 2.1 g/dL — ABNORMAL LOW (ref 3.5–5.0)
Anion gap: 8 (ref 5–15)
BUN: 46 mg/dL — ABNORMAL HIGH (ref 8–23)
CO2: 28 mmol/L (ref 22–32)
Calcium: 8 mg/dL — ABNORMAL LOW (ref 8.9–10.3)
Chloride: 98 mmol/L (ref 98–111)
Creatinine, Ser: 4.83 mg/dL — ABNORMAL HIGH (ref 0.44–1.00)
GFR, Estimated: 9 mL/min — ABNORMAL LOW (ref >60)
Glucose, Bld: 68 mg/dL — ABNORMAL LOW (ref 70–99)
Phosphorus: 4.9 mg/dL — ABNORMAL HIGH (ref 2.5–4.6)
Potassium: 3.7 mmol/L (ref 3.5–5.1)
Sodium: 134 mmol/L — ABNORMAL LOW (ref 135–145)

## 2023-11-22 LAB — HEMOGLOBIN A1C
Hgb A1c MFr Bld: 6.3 % — ABNORMAL HIGH (ref 4.8–5.6)
Mean Plasma Glucose: 134 mg/dL

## 2023-11-22 LAB — GLUCOSE, CAPILLARY
Glucose-Capillary: 105 mg/dL — ABNORMAL HIGH (ref 70–99)
Glucose-Capillary: 165 mg/dL — ABNORMAL HIGH (ref 70–99)
Glucose-Capillary: 175 mg/dL — ABNORMAL HIGH (ref 70–99)
Glucose-Capillary: 61 mg/dL — ABNORMAL LOW (ref 70–99)
Glucose-Capillary: 70 mg/dL (ref 70–99)
Glucose-Capillary: 86 mg/dL (ref 70–99)
Glucose-Capillary: 93 mg/dL (ref 70–99)

## 2023-11-22 LAB — MAGNESIUM: Magnesium: 2 mg/dL (ref 1.7–2.4)

## 2023-11-22 LAB — HEPATITIS B SURFACE ANTIBODY, QUANTITATIVE: Hep B S AB Quant (Post): <3.5 m[IU]/mL — ABNORMAL LOW

## 2023-11-22 NOTE — Progress Notes (Signed)
 PROGRESS NOTE    Jennifer [name]  UJW:[id number] DOB: 01-16-1956 DOA: 11/20/2023 PCP: Clemens Curt, MD   Brief Narrative:    Jennifer [name] is a 68 y.o. female with medical history significant for hypertension, type 2 diabetes mellitus, hypothyroidism, ESRD on hemodialysis, CAD, depression, anxiety, memory loss, and history of CVA with residual deficits who presents with acute onset of left-sided weakness and speech difficulty.  Patient has been admitted for evaluation of acute encephalopathy with left-sided weakness.  MRI does not show any signs of acute stroke.  EEG negative for any acute findings.  PT now recommending SNF and patient awaiting placement and receiving hemodialysis in the interim.  Assessment & Plan:   Principal Problem:   Acute left-sided weakness Active Problems:   COPD (chronic obstructive pulmonary disease) (HCC)   Acquired hypothyroidism   Coronary artery disease involving native coronary artery of native heart without angina pectoris   History of CVA (cerebrovascular accident)   ESRD (end stage renal disease) (HCC)  Assessment and Plan:   1. Left-sided weakness and speech difficulty  - Appreciate neurology evaluation with plan to assess EEG -Continue aspirin  81 mg daily -PT recommending SNF -Dysphagia 1 diet per SLP   2. ERSD on home HD 4x/week - HD per nephrology planned for tomorrow   3. Depression, anxiety, memory-loss  - Use delirium precautions, hold potentially sedating medications for now  - Patient now more alert and awake   4. Type II DM  - A1c was 6.2% in December 2024  - Check CBGs, use long-acting insulin  and sliding-scale correctional for now     5. Hypertension  - Permit HTN for now     6. Hypothyroidism  - Synthroid     7. CAD  - Continue ASA and Lipitor , hold beta-blocker for now while permitting HTN    8. Hx of CVA  - ASA, Lipitor       9. Obesity, Class 1 -BMI 32.84   DVT prophylaxis:Heparin  Code Status: Full Family  Communication: None at bedside Disposition Plan:  Status is: Inpatient Remains inpatient appropriate because: Need for IV medications and placement.    Consultants:  Neurology Nephrology  Procedures:  None  Antimicrobials:  None   Subjective: Patient seen and evaluated today with no new acute complaints or concerns. No acute concerns or events noted overnight.  She still has some slurred speech but able to move her left leg.  She knows where she is, but cannot tell me the month or the year.  Objective: Vitals:   11/21/23 2045 11/22/23 0007 11/22/23 0407 11/22/23 0739  BP: (!) 127/51 (!) 135/49 106/61 (!) 131/44  Pulse: 84 82 81 80  Resp:      Temp: 98.9 F (37.2 C) 98.7 F (37.1 C) 98 F (36.7 C) 98.7 F (37.1 C)  TempSrc: Oral Oral Oral Oral  SpO2: 96% 96% 95% 97%  Weight:      Height:        Intake/Output Summary (Last 24 hours) at 11/22/2023 1217 Last data filed at 11/22/2023 1000 Gross per 24 hour  Intake 300 ml  Output 500 ml  Net -200 ml   Filed Weights   11/21/23 1253 11/21/23 1620 11/21/23 1950  Weight: 93.1 kg 93.1 kg 92.9 kg    Examination:  General exam: Appears calm and comfortable  Respiratory system: Clear to auscultation. Respiratory effort normal. Cardiovascular system: S1 & S2 heard, RRR.  Gastrointestinal system: Abdomen is soft Central nervous system: Alert and awake Extremities: No  edema Skin: No significant lesions noted Psychiatry: Flat affect.    Data Reviewed: I have personally reviewed following labs and imaging studies  CBC: Recent Labs  Lab 11/20/23 2141 11/20/23 2230 11/21/23 0548 11/22/23 0348  WBC 8.6  --  10.7* 8.5  NEUTROABS 6.5  --   --   --   HGB 10.2* 9.9* 10.4* 9.2*  HCT 31.5* 29.0* 32.2* 29.8*  MCV 97.5  --  96.1 97.4  PLT 239  --  180 218   Basic Metabolic Panel: Recent Labs  Lab 11/20/23 2141 11/20/23 2230 11/21/23 0548 11/22/23 0348  NA 131* 130* 134* 134*  K 4.2 3.9 4.2 3.7  CL 93* 94* 98 98   CO2 25  --  24 28  GLUCOSE 207* 202* 61* 68*  BUN 60* 59* 70* 46*  CREATININE 5.71* 5.90* 6.10* 4.83*  CALCIUM  8.0*  --  8.1* 8.0*  MG  --   --   --  2.0  PHOS  --   --   --  4.9*   GFR: Estimated Creatinine Clearance: 13 mL/min (A) (by C-G formula based on SCr of 4.83 mg/dL (H)). Liver Function Tests: Recent Labs  Lab 11/20/23 2141 11/22/23 0348  AST 18  --   ALT 13  --   ALKPHOS 65  --   BILITOT 0.8  --   PROT 4.9*  --   ALBUMIN 2.3* 2.1*   No results for input(s): LIPASE, AMYLASE in the last 168 hours. No results for input(s): AMMONIA in the last 168 hours. Coagulation Profile: Recent Labs  Lab 11/20/23 2141  INR 1.2   Cardiac Enzymes: No results for input(s): CKTOTAL, CKMB, CKMBINDEX, TROPONINI in the last 168 hours. BNP (last 3 results) No results for input(s): PROBNP in the last 8760 hours. HbA1C: Recent Labs    11/20/23 2141  HGBA1C 6.3*   CBG: Recent Labs  Lab 11/21/23 2048 11/22/23 0006 11/22/23 0409 11/22/23 0743 11/22/23 1132  GLUCAP 137* 175* 70 86 105*   Lipid Profile: Recent Labs    11/21/23 0548  CHOL 125  HDL 67  LDLCALC 51  TRIG 36  CHOLHDL 1.9   Thyroid  Function Tests: Recent Labs    11/20/23 2359  TSH 1.353  FREET4 1.06   Anemia Panel: Recent Labs    11/20/23 2359  VITAMINB12 586   Sepsis Labs: No results for input(s): PROCALCITON, LATICACIDVEN in the last 168 hours.  Recent Results (from the past 240 hours)  Urine Culture (for pregnant, neutropenic or urologic patients or patients with an indwelling urinary catheter)     Status: Abnormal (Preliminary result)   Collection Time: 11/20/23 10:15 PM   Specimen: In/Out Cath Urine  Result Value Ref Range Status   Specimen Description   Final    IN/OUT CATH URINE Performed at [facility], [address], [place], [place] [address]    Special Requests   Final    NONE Performed at [facility], [address] [place], [place] [address]    Culture  (A)  Final    >=100,000 COLONIES/mL GRAM NEGATIVE RODS SUSCEPTIBILITIES TO FOLLOW CULTURE REINCUBATED FOR BETTER GROWTH Performed at [facility], [address] [place], [place] [address]    Report Status PENDING  Incomplete         Radiology Studies: EEG adult Result Date: 11/21/2023 Arleene Lack, MD     11/21/2023  5:33 PM Patient Name: Jennifer [name] MRN: [id number] Epilepsy Attending: Arleene Lack Referring Physician/Provider:  Arleene Lack, MD Date: 11/21/2023 Duration: 23.40 mins Patient history: 68 year old female presented with altered mental status and left-sided weakness. EEG to evaluate for seizure Level of alertness: Awake, asleep AEDs during EEG study: None Technical aspects: This EEG study was done with scalp electrodes positioned according to the 10-20 International system of electrode placement. Electrical activity was reviewed with band pass filter of 1-70Hz , sensitivity of 7 uV/mm, display speed of 53mm/sec with a 60Hz  notched filter applied as appropriate. EEG data were recorded continuously and digitally stored.  Video monitoring was available and reviewed as appropriate. Description: The posterior dominant rhythm consists of 6 Hz activity of moderate voltage (25-35 uV) seen predominantly in posterior head regions, symmetric and reactive to eye opening and eye closing. Sleep was characterized by vertex waves, sleep spindles (12 to 14 Hz), maximal frontocentral region. EEG showed continuous generalized predominantly 5  to 6 Hz theta-delta slowing admixed with intermittent 2-3hz  delta slowing.  Physiologic photic driving was not seen during photic stimulation.  Hyperventilation was not performed.   ABNORMALITY - Continuous slow, generalized IMPRESSION: This study is suggestive of moderate diffuse encephalopathy. No seizures or epileptiform discharges were seen throughout the recording. Arleene Lack   ECHOCARDIOGRAM COMPLETE Result Date: 11/21/2023     ECHOCARDIOGRAM REPORT   Patient Name:   Jennifer [name] Date of Exam: 11/21/2023 Medical Rec #:  [id number]     Height:       67.0 in Accession #:    [id number]    Weight:       209.7 lb Date of Birth:  [birth year]    BSA:          2.063 m Patient Age:    67 years      BP:           116/58 mmHg Patient Gender: F             HR:           77 bpm. Exam Location:  Inpatient Procedure: 2D Echo, Cardiac Doppler and Color Doppler (Both Spectral and Color            Flow Doppler were utilized during procedure). Indications:    Stroke I63.9  History:        Patient has prior history of Echocardiogram examinations, most                 recent 06/16/2023.  Sonographer:    Hersey Lorenzo RDCS Referring Phys: [phone] TIMOTHY S OPYD IMPRESSIONS  1. Left ventricular ejection fraction, by estimation, is 65 to 70%. The left ventricle has normal function. The left ventricle has no regional wall motion abnormalities. Left ventricular diastolic parameters are indeterminate.  2. Right ventricular systolic function is normal. The right ventricular size is normal. Tricuspid regurgitation signal is inadequate for assessing PA pressure.  3. The mitral valve is grossly normal. Trivial mitral valve regurgitation.  4. The aortic valve is tricuspid. There is mild calcification of the aortic valve. Aortic valve regurgitation is not visualized. Aortic valve sclerosis/calcification is present, without any evidence of aortic stenosis.  5. The inferior vena cava is normal in size with greater than 50% respiratory variability, suggesting right atrial pressure of 3 mmHg. Comparison(s): Prior images reviewed side by side. LVEF 65-70% range. Mildly calcified aortic valve without stenosis. Interatrial septum bows to the right, no obvious shunting. FINDINGS  Left Ventricle: Left ventricular ejection fraction, by estimation, is 65 to 70%. The left ventricle has normal function. The left  ventricle has no regional wall motion abnormalities. The left ventricular  internal cavity size was normal in size. There is  borderline concentric left ventricular hypertrophy. Left ventricular diastolic parameters are indeterminate. Right Ventricle: The right ventricular size is normal. No increase in right ventricular wall thickness. Right ventricular systolic function is normal. Tricuspid regurgitation signal is inadequate for assessing PA pressure. Left Atrium: Left atrial size was normal in size. Right Atrium: Right atrial size was normal in size. Pericardium: There is no evidence of pericardial effusion. Mitral Valve: The mitral valve is grossly normal. Trivial mitral valve regurgitation. Tricuspid Valve: The tricuspid valve is grossly normal. Tricuspid valve regurgitation is trivial. Aortic Valve: The aortic valve is tricuspid. There is mild calcification of the aortic valve. There is mild aortic valve annular calcification. Aortic valve regurgitation is not visualized. Aortic valve sclerosis/calcification is present, without any evidence of aortic stenosis. Pulmonic Valve: The pulmonic valve was grossly normal. Pulmonic valve regurgitation is trivial. Aorta: The aortic root and ascending aorta are structurally normal, with no evidence of dilitation. Venous: The inferior vena cava is normal in size with greater than 50% respiratory variability, suggesting right atrial pressure of 3 mmHg. IAS/Shunts: There is right bowing of the interatrial septum, suggestive of elevated left atrial pressure. No atrial level shunt detected by color flow Doppler. Additional Comments: 3D was performed not requiring image post processing on an independent workstation and was indeterminate.  LEFT VENTRICLE PLAX 2D LVIDd:         5.10 cm   Diastology LVIDs:         3.30 cm   LV e' medial:    8.59 cm/s LV PW:         1.10 cm   LV E/e' medial:  9.8 LV IVS:        1.00 cm   LV e' lateral:   9.79 cm/s LVOT diam:     1.90 cm   LV E/e' lateral: 8.6 LV SV:         60 LV SV Index:   29 LVOT Area:     2.84 cm   RIGHT VENTRICLE         IVC TAPSE (M-mode): 1.7 cm  IVC diam: 1.40 cm LEFT ATRIUM             Index        RIGHT ATRIUM           Index LA diam:        3.30 cm 1.60 cm/m   RA Area:     13.70 cm LA Vol (A2C):   33.8 ml 16.38 ml/m  RA Volume:   33.40 ml  16.19 ml/m LA Vol (A4C):   53.4 ml 25.88 ml/m LA Biplane Vol: 47.1 ml 22.83 ml/m  AORTIC VALVE LVOT Vmax:   112.00 cm/s LVOT Vmean:  67.700 cm/s LVOT VTI:    0.211 m  AORTA Ao Root diam: 3.20 cm Ao Asc diam:  3.70 cm MITRAL VALVE MV Area (PHT): 3.00 cm     SHUNTS MV Decel Time: 253 msec     Systemic VTI:  0.21 m MV E velocity: 84.00 cm/s   Systemic Diam: 1.90 cm MV A velocity: 119.00 cm/s MV E/A ratio:  0.71 Teddie Favre MD Electronically signed by Teddie Favre MD Signature Date/Time: 11/21/2023/3:04:47 PM    Final    MR ANGIO HEAD WO CONTRAST Result Date: 11/21/2023 CLINICAL DATA:  68 year old female neurologic deficit. Code stroke presentation yesterday. History of a right  brainstem hemorrhage in December. EXAM: MRA HEAD WITHOUT CONTRAST TECHNIQUE: Angiographic images of the Circle of Willis were acquired using MRA technique without intravenous contrast. COMPARISON:  Brain MRI today.  CTA head and neck yesterday. FINDINGS: Anterior circulation: Antegrade flow in both ICA siphons. Patent carotid termini. Bilateral siphon irregularity but no significant siphon stenosis. MCA and ACA origins appear normal. Anterior communicating artery and median artery of the corpus callosum are present. Left ACA appears dominant throughout. Severe stenosis of the proximal right ACA A2 on series 1033, image 13. Bilateral MCA M1 segments and bifurcations are patent. Multifocal moderate and severe bilateral MCA branch irregularity and stenosis as seen on series 1033, images 4 and 14. Posterior circulation: Patent distal vertebral arteries, vertebrobasilar junction, basilar artery without stenosis. Patent PCA origins. Posterior communicating arteries are diminutive or  absent. Moderate to severe stenosis left PCA P2 segment (series 1021, image 4). Mild contralateral right P1 and P2 segment stenosis. Evidence of moderate and severe PCA P3 segment stenosis, including on the right side series 1021, image 17. Anatomic variants: Median artery of the corpus callosum. Other: Brain MRI today reported separately. IMPRESSION: 1. Negative for large vessel occlusion. 2. Positive for advanced intracranial atherosclerosis. No significant 1st order vessel stenosis, but moderate to severe stenosis in anterior and posterior circulation 2nd order and distal branches. 3. MRI today reported separately. Electronically Signed   By: Marlise Simpers M.D.   On: 11/21/2023 12:11   MR BRAIN WO CONTRAST Result Date: 11/21/2023 CLINICAL DATA:  68 year old female neurologic deficit. Code stroke presentation yesterday. 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 EXAM: MRI HEAD WITHOUT CONTRAST TECHNIQUE: Multiplanar, multiecho pulse sequences of the brain and surrounding structures were obtained without intravenous contrast. COMPARISON:  CT head and CTA head and neck yesterday. Brain MRI 05/21/2023. FINDINGS: Brain: No restricted diffusion to suggest acute infarction. No midline shift, mass effect, evidence of mass lesion, ventriculomegaly, extra-axial collection or acute intracranial hemorrhage. Cervicomedullary junction and pituitary are within normal limits. Numerous chronic microhemorrhages, concentrated in the brainstem, periventricular white matter, right mesial temporal lobe as before. Superimposed advanced, confluent bilateral cerebral white matter T2 and FLAIR hyperintensity. Chronic lacunar infarcts in the bilateral deep white matter capsules, bilateral deep gray nuclei, brainstem, bilateral cerebellum. Regressed mild brainstem edema seen in December. No new signal abnormality identified. Vascular: Major intracranial vascular flow voids are stable since last year. MRA today is reported separately.  Skull and upper cervical spine: Stable and negative. Sinuses/Orbits: Stable and negative. Other: Stable trace mastoid effusions. Negative visible scalp and face. IMPRESSION: 1. No acute intracranial abnormality. 2. Expected evolution of small brainstem hemorrhage since December. And otherwise stable severe underlying chronic small vessel disease. 3. MRA reported separately. Electronically Signed   By: Marlise Simpers M.D.   On: 11/21/2023 12:07   CT ANGIO HEAD NECK W WO CM (CODE STROKE) Result Date: 11/20/2023 CLINICAL DATA:  Initial evaluation for acute neuro deficit, stroke suspected. EXAM: CT ANGIOGRAPHY HEAD AND NECK WITH AND WITHOUT CONTRAST TECHNIQUE: Multidetector CT imaging of the head and neck was performed using the standard protocol during bolus administration of intravenous contrast. Multiplanar CT image reconstructions and MIPs were obtained to evaluate the vascular anatomy. Carotid stenosis measurements (when applicable) are obtained utilizing NASCET criteria, using the distal internal carotid diameter as the denominator. RADIATION DOSE REDUCTION: This exam was performed according to the departmental dose-optimization program which includes automated exposure control, adjustment of the mA and/or kV according to patient size and/or use of iterative reconstruction  technique. CONTRAST:  75mL OMNIPAQUE  IOHEXOL  350 MG/ML SOLN COMPARISON:  Comparison made with head CT from earlier the same day as well as prior CTA from 05/20/2023. FINDINGS: CTA NECK FINDINGS Aortic arch: Visualized aortic arch within normal limits for caliber with standard 3 vessel morphology. Aortic atherosclerosis. No significant stenosis about the origin the great vessels. Right carotid system: Right common and internal carotid arteries are patent without dissection. Mild atheromatous change about the right carotid bulb without hemodynamically significant greater than 50% stenosis. Left carotid system: Left common and internal carotid arteries  are patent without dissection. Mild atheromatous change about the left carotid bulb without hemodynamically significant stenosis. Vertebral arteries: Both vertebral arteries arise from subclavian arteries. Vertebral arteries are patent without stenosis or dissection. Skeleton: No worrisome osseous lesions. Other neck: No other acute finding. Upper chest: No other acute finding. Review of the MIP images confirms the above findings CTA HEAD FINDINGS Anterior circulation: Atheromatous change about the carotid siphons with associated mild to moderate multifocal narrowing bilaterally. A1 segments patent bilaterally. Normal anterior communicating artery complex. Anterior cerebral arteries patent without significant stenosis. No M1 stenosis or occlusion. Distal MCA branches perfused and symmetric. Posterior circulation: Both V4 segments patent without stenosis. Both PICA patent at their origins. Basilar patent without stenosis. Superior cerebral arteries patent bilaterally. Both PCAs primarily supplied via the basilar. Atheromatous irregularity about the PCAs bilaterally the with associated mild to moderate bilateral P2 stenoses. PCAs remain patent to their distal aspects. Venous sinuses: Patent allowing for timing the contrast bolus. Anatomic variants: None significant.  No aneurysm. Review of the MIP images confirms the above findings IMPRESSION: 1. Negative CTA for large vessel occlusion or other emergent finding. 2. Intracranial atherosclerotic disease, most notably about the carotid siphons and PCAs where there are associated mild to moderate multifocal stenoses. 3. Mild atheromatous change about the carotid bifurcations without significant stenosis. No hemodynamically significant stenosis within the neck. 4. Aortic Atherosclerosis (ICD10-I70.0). Results were called by telephone at the time of interpretation on 11/20/2023 at 10:18 pm to provider [facility] , who verbally acknowledged these results. Electronically  Signed   By: Virgia Griffins M.D.   On: 11/20/2023 22:19   CT HEAD CODE STROKE WO CONTRAST Result Date: 11/20/2023 CLINICAL DATA:  Code stroke. Initial evaluation for acute neuro deficit, stroke suspected. EXAM: CT HEAD WITHOUT CONTRAST TECHNIQUE: Contiguous axial images were obtained from the base of the skull through the vertex without intravenous contrast. RADIATION DOSE REDUCTION: This exam was performed according to the departmental dose-optimization program which includes automated exposure control, adjustment of the mA and/or kV according to patient size and/or use of iterative reconstruction technique. COMPARISON:  Prior study from 05/20/2023 FINDINGS: Brain: Generalized age-related cerebral atrophy. Patchy hypodensity involving the supratentorial cerebral white matter, consistent chronic small vessel ischemic disease, advanced in nature. Remote right basal ganglia lacunar infarct. Additional chronic left cerebellar infarct. No acute intracranial hemorrhage. No acute large vessel territory infarct. No mass lesion or midline shift. No hydrocephalus or extra-axial fluid collection. Vascular: No abnormal hyperdense vessel. Calcified atherosclerosis present about the skull base. Skull: Scalp soft tissues within normal limits.  Calvarium intact. Sinuses/Orbits: Globes and orbital soft tissues within normal limits. Small osteoma noted within the right ethmoidal air cells. Paranasal sinuses are largely clear. No significant mastoid effusion. Other: None. ASPECTS [facility] Stroke Program Early CT Score) - Ganglionic level infarction (caudate, lentiform nuclei, internal capsule, insula, M1-M3 cortex): 7 - Supraganglionic infarction (M4-M6 cortex): 3 Total score (0-10 with 10 being normal):  10 IMPRESSION: 1. No acute intracranial abnormality. 2. ASPECTS is 10. 3. Atrophy with advanced chronic microvascular ischemic disease, with chronic right basal ganglia and left cerebellar infarcts. Results were called by  telephone at the time of interpretation on 11/20/2023 at 9:55 pm to provider [facility] , who verbally acknowledged these results. Electronically Signed   By: Virgia Griffins M.D.   On: 11/20/2023 22:00        Scheduled Meds:   stroke: early stages of recovery book   Does not apply Once   aspirin   81 mg Oral Daily   atorvastatin   80 mg Oral q1800   Chlorhexidine  Gluconate Cloth  6 each Topical Q0600   heparin   5,000 Units Subcutaneous Q8H   insulin  aspart  0-6 Units Subcutaneous Q4H   levothyroxine   175 mcg Oral Q0600   sevelamer carbonate  0.8 g Oral TID WC   tobramycin  1 drop Right Eye Q2H     LOS: 1 day    Time spent: 55 minutes    Jaquis Picklesimer Loran Rock, DO [facility]  If 7PM-7AM, please contact night-coverage [email or web address] 11/22/2023, 12:17 PM

## 2023-11-22 NOTE — Progress Notes (Signed)
 This RN attests to all student documentation. Assessment was monitored by this RN.  Glorious Flicker V. Aurthur Wingerter, MSN-RN Clinical Instructor/Faculty [employer] Program  [facility]

## 2023-11-22 NOTE — Progress Notes (Signed)
 Speech Language Pathology Treatment: Dysphagia  Patient Details Name: Jennifer [name] MRN: [id number] DOB: 12-04-1955 Today's Date: 11/22/2023 Time: 1000-1020 SLP Time Calculation (min) (ACUTE ONLY): 20 min  Assessment / Plan / Recommendation Clinical Impression  Pt seen for ST session targeting dysphagia diagnostic tx. Pt in bed upon SLP arrival with husband present. Pt with improved alertness and communication compared to eval yesterday. Answering questions more and speaking in longer sentences. Pt also with improved ability to feed herself. Nursing student reports pt ate 90% of Dys 1 breakfast this AM with no difficulty. Dentures still not present.   When provided set up A, she self-administered pudding via spoon, fruit via spoon, and thin liquids via cup/straw. Timely swallow and no oral residue observed with pudding; however, she ate with a rapid rate. Pt was impulsive with fruit, overfilling her mouth and mod lingual residue was observed that cleared with clinician administered liquid wash. Mastication was prolonged and inefficient. No overt s/sx of overt aspiration observed across consistencies.   Given pt's lack of dentition and impulsivity, rec continue Dys 1 with thin liquids at this time. Pt will still need supervision for cues to reduce rate and use small bites/sips. Verbal education provided to pt and spouse on diet rec/strategies; they verbalized understanding. Informed nursing as well. Pt may benefit from one additional ST visit to determine potential for diet texture upgrade (if dentures are present).     HPI HPI: Pt is a 68 y.o. 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 Pt known to ST service during Dec 2024 [facility] admission, presented with  oral phase dysphagia and dysarthria at that time. Previous MBSS 05/22/23.      SLP Plan  Continue with current plan of care          Recommendations  Diet recommendations: Dysphagia 1 (puree);Thin liquid Liquids provided via: Cup;Straw Medication Administration: Crushed with puree Supervision: Patient able to self feed Compensations: Slow rate;Small sips/bites;Monitor for anterior loss;Minimize environmental distractions Postural Changes and/or Swallow Maneuvers: Seated upright 90 degrees;Upright 30-60 min after meal                  Oral care BID   Set up Supervision/Assistance Dysphagia, unspecified (R13.10)     Continue with current plan of care     Caretha Chapel, MA CCC-SLP Speech-Language Pathologist 11/22/2023, 11:26 AM

## 2023-11-22 NOTE — Progress Notes (Signed)
 ,Jennifer [name] is an 68 y.o. female with HTN, DM, HLD, CAD, h/o CVA, hypothyroidism, dementia, and ESRD (on HHD MTThFr) who p/w left sided weakness and slurred speech around 5 pm 6/9.  Pt has right facial droop at baseline and AMS.  Neurology consulted and allowing permissive HTN.  CTA without significant occlusion or emergent finding.    Dialysis orders: MTTHF rt BCF 2hr45 EDW 90.5kg Mircera 100q2 (last given 6/5)  Assessment/Plan:  Left sided weakness with slurred speech and AMS - Neuro following and given asa, permissive HTN.  MRI of brain pending.  Nothing acute on CT of head  ESRD -  normally has HD Sat/Sun/Tues/Wed at home; shorten treatment on 6/10 because of issues with a buttonhole was able to tolerate 2.5 hours within the UF of 500 mL.  Patient appears to be very comfortable and is not short of breath.  Daughter to bring the shorter buttonhole needles today and will plan on dialysis tomorrow + either Fri or Sat depending on staffing.  Would prefer Thursday Saturday if at all possible to help get her on her home regimen.  Hypertension/volume  - UF gently given AMS and possible CVA.  Anemia  - stable and recently received mircera; Hb 9.2 this AM  Metabolic bone disease -   continue with home meds; phos 4.9  Nutrition -  renal diet, carb modified when cleared for po. DM - per primary  Subjective: Spouse bedside updated; patient able to move her left leg but still has weakness and slurred speech.  Right facial droop is chronic.  Denies shortness of breath or nausea.   Chemistry and CBC: Creatinine, Ser  Date/Time Value Ref Range Status  11/22/2023 03:48 AM 4.83 (H) 0.44 - 1.00 mg/dL Final  96/09/5407 81:19 AM 6.10 (H) 0.44 - 1.00 mg/dL Final  14/78/2956 21:30 PM 5.90 (H) 0.44 - 1.00 mg/dL Final  86/57/8469 62:95 PM 5.71 (H) 0.44 - 1.00 mg/dL Final  28/41/3244 01:02 AM 5.92 (H) 0.44 - 1.00 mg/dL Final  72/53/6644 03:47 AM 4.31 (H) 0.44 - 1.00 mg/dL Final  42/59/5638 75:64 AM 5.68 (H)  0.44 - 1.00 mg/dL Final  33/29/5188 41:66 PM 4.03 (H) 0.44 - 1.00 mg/dL Final  12/11/1599 09:32 AM 4.10 (H) 0.44 - 1.00 mg/dL Final  35/57/3220 25:42 AM 3.33 (H) 0.44 - 1.00 mg/dL Final  70/62/3762 83:15 AM 3.92 (H) 0.44 - 1.00 mg/dL Final  17/61/6073 71:06 AM 4.69 (H) 0.44 - 1.00 mg/dL Final  26/94/8546 27:03 AM 4.36 (H) 0.44 - 1.00 mg/dL Final  50/02/3817 29:93 PM 4.41 (H) 0.44 - 1.00 mg/dL Final  71/69/6789 38:10 PM 3.35 (H) 0.44 - 1.00 mg/dL Final  17/51/0258 52:77 PM 2.74 (H) 0.44 - 1.00 mg/dL Final  82/42/3536 14:43 AM 3.06 (H) 0.44 - 1.00 mg/dL Final  15/40/0867 61:95 AM 3.31 (H) 0.44 - 1.00 mg/dL Final  09/32/6712 45:80 AM 3.75 (H) 0.44 - 1.00 mg/dL Final  99/83/3825 05:39 PM 4.10 (H) 0.44 - 1.00 mg/dL Final  76/73/4193 79:02 AM 4.05 (H) 0.40 - 1.20 mg/dL Final  40/97/3532 99:24 AM 2.38 (H) 0.40 - 1.20 mg/dL Final  26/83/4196 22:29 AM 2.56 (H) 0.40 - 1.20 mg/dL Final  79/89/2119 41:74 PM 1.38 (H) 0.40 - 1.20 mg/dL Final  01/24/4817 56:31 PM 1.47 (H) 0.44 - 1.00 mg/dL Final  49/70/2637 85:88 AM 1.14 0.40 - 1.20 mg/dL Final  50/27/7412 87:86 AM 1.47 (H) 0.44 - 1.00 mg/dL Final  76/72/0947 09:62 AM 1.14 (H) 0.44 - 1.00 mg/dL Final  83/66/2947 65:46  AM 1.69 (H) 0.44 - 1.00 mg/dL Final  16/03/9603 54:09 AM 1.23 (H) 0.44 - 1.00 mg/dL Final  81/19/1478 29:56 AM 1.00 0.44 - 1.00 mg/dL Final  21/30/8657 84:69 AM 0.83 0.44 - 1.00 mg/dL Final  62/95/2841 32:44 AM 0.78 0.44 - 1.00 mg/dL Final  06/15/7251 66:44 AM 0.84 0.44 - 1.00 mg/dL Final  03/47/4259 56:38 PM 0.80 0.44 - 1.00 mg/dL Final  75/64/3329 51:88 AM 0.96 0.44 - 1.00 mg/dL Final  41/66/0630 16:01 AM 0.80 0.44 - 1.00 mg/dL Final  09/32/3557 32:20 AM 0.80 0.44 - 1.00 mg/dL Final  25/42/7062 37:62 PM 1.22 (H) 0.44 - 1.00 mg/dL Final  83/15/1761 60:73 AM 0.81 0.44 - 1.00 mg/dL Final  71/11/2692 85:46 PM 0.77 0.44 - 1.00 mg/dL Final  27/08/5007 38:18 PM 0.77 0.44 - 1.00 mg/dL Final  29/93/7169 67:89 AM 0.7 0.4 - 1.2 mg/dL Final   38/03/1750 02:58 PM 0.8 0.4 - 1.2 mg/dL Final  52/77/8242 35:36 AM 0.8 0.4 - 1.2 mg/dL Final  14/43/1540 08:67 AM 0.8 0.4 - 1.2 mg/dL Final  61/95/0932 67:12 AM 0.8 0.4 - 1.2 mg/dL Final  45/80/9983 38:25 PM 0.85 0.40 - 1.20 mg/dL Final  05/39/7673 41:93 PM 0.83 0.40 - 1.20 mg/dL Final   Recent Labs  Lab 11/20/23 2141 11/20/23 2230 11/21/23 0548 11/22/23 0348  NA 131* 130* 134* 134*  K 4.2 3.9 4.2 3.7  CL 93* 94* 98 98  CO2 25  --  24 28  GLUCOSE 207* 202* 61* 68*  BUN 60* 59* 70* 46*  CREATININE 5.71* 5.90* 6.10* 4.83*  CALCIUM  8.0*  --  8.1* 8.0*  PHOS  --   --   --  4.9*   Recent Labs  Lab 11/20/23 2141 11/20/23 2230 11/21/23 0548 11/22/23 0348  WBC 8.6  --  10.7* 8.5  NEUTROABS 6.5  --   --   --   HGB 10.2* 9.9* 10.4* 9.2*  HCT 31.5* 29.0* 32.2* 29.8*  MCV 97.5  --  96.1 97.4  PLT 239  --  180 218   Liver Function Tests: Recent Labs  Lab 11/20/23 2141 11/22/23 0348  AST 18  --   ALT 13  --   ALKPHOS 65  --   BILITOT 0.8  --   PROT 4.9*  --   ALBUMIN 2.3* 2.1*   No results for input(s): LIPASE, AMYLASE in the last 168 hours. No results for input(s): AMMONIA in the last 168 hours. Cardiac Enzymes: No results for input(s): CKTOTAL, CKMB, CKMBINDEX, TROPONINI in the last 168 hours. Iron Studies: No results for input(s): IRON, TIBC, TRANSFERRIN, FERRITIN in the last 72 hours. PT/INR: @LABRCNTIP (inr:5)  Xrays/Other Studies: ) Results for orders placed or performed during the hospital encounter of 11/20/23 (from the past 48 hours)  CBG monitoring, ED     Status: Abnormal   Collection Time: 11/20/23  9:38 PM  Result Value Ref Range   Glucose-Capillary 204 (H) 70 - 99 mg/dL    Comment: Glucose reference range applies only to samples taken after fasting for at least 8 hours.  Ethanol     Status: None   Collection Time: 11/20/23  9:40 PM  Result Value Ref Range   Alcohol , Ethyl (B) <15 <15 mg/dL    Comment: (NOTE) For medical  purposes only. Performed at [facility], [address], [place], [place] [address]   Protime-INR     Status: Abnormal   Collection Time: 11/20/23  9:41 PM  Result Value Ref Range   Prothrombin  Time 15.7 (H) 11.4 - 15.2 seconds   INR 1.2 0.8 - 1.2    Comment: (NOTE) INR goal varies based on device and disease states. Performed at [facility], [address], [place], [place] [address]   APTT     Status: Abnormal   Collection Time: 11/20/23  9:41 PM  Result Value Ref Range   aPTT 40 (H) 24 - 36 seconds    Comment:        IF BASELINE aPTT IS ELEVATED, SUGGEST PATIENT RISK ASSESSMENT BE USED TO DETERMINE APPROPRIATE ANTICOAGULANT THERAPY. Performed at [facility], [address], [place], [place] [address]   CBC     Status: Abnormal   Collection Time: 11/20/23  9:41 PM  Result Value Ref Range   WBC 8.6 4.0 - 10.5 K/uL   RBC 3.23 (L) 3.87 - 5.11 MIL/uL   Hemoglobin 10.2 (L) 12.0 - 15.0 g/dL   HCT 63.7 (L) 85.8 - 85.0 %   MCV 97.5 80.0 - 100.0 fL   MCH 31.6 26.0 - 34.0 pg   MCHC 32.4 30.0 - 36.0 g/dL   RDW 27.7 (H) 41.2 - 87.8 %   Platelets 239 150 - 400 K/uL   nRBC 0.0 0.0 - 0.2 %    Comment: Performed at [facility], [address], [place], [place] [address]  Differential     Status: Abnormal   Collection Time: 11/20/23  9:41 PM  Result Value Ref Range   Neutrophils Relative % 75 %   Neutro Abs 6.5 1.7 - 7.7 K/uL   Lymphocytes Relative 12 %   Lymphs Abs 1.0 0.7 - 4.0 K/uL   Monocytes Relative 10 %   Monocytes Absolute 0.8 0.1 - 1.0 K/uL   Eosinophils Relative 1 %   Eosinophils Absolute 0.0 0.0 - 0.5 K/uL   Basophils Relative 1 %   Basophils Absolute 0.1 0.0 - 0.1 K/uL   Immature Granulocytes 1 %   Abs Immature Granulocytes 0.09 (H) 0.00 - 0.07 K/uL    Comment: Performed at [facility], [address] [place], [place] [address]  Comprehensive metabolic panel     Status: Abnormal   Collection Time: 11/20/23  9:41 PM  Result Value Ref Range   Sodium 131 (L) 135 -  145 mmol/L   Potassium 4.2 3.5 - 5.1 mmol/L   Chloride 93 (L) 98 - 111 mmol/L   CO2 25 22 - 32 mmol/L   Glucose, Bld 207 (H) 70 - 99 mg/dL    Comment: Glucose reference range applies only to samples taken after fasting for at least 8 hours.   BUN 60 (H) 8 - 23 mg/dL   Creatinine, Ser 9.62 (H) 0.44 - 1.00 mg/dL   Calcium  8.0 (L) 8.9 - 10.3 mg/dL   Total Protein 4.9 (L) 6.5 - 8.1 g/dL   Albumin 2.3 (L) 3.5 - 5.0 g/dL   AST 18 15 - 41 U/L   ALT 13 0 - 44 U/L   Alkaline Phosphatase 65 38 - 126 U/L   Total Bilirubin 0.8 0.0 - 1.2 mg/dL   GFR, Estimated 8 (L) >60 mL/min    Comment: (NOTE) Calculated using the CKD-EPI Creatinine Equation (2021)    Anion gap 13 5 - 15    Comment: Performed at [facility], [address] [place], [place] [address]  Hemoglobin A1c     Status: Abnormal   Collection Time: 11/20/23  9:41 PM  Result Value Ref Range   Hgb A1c MFr Bld 6.3 (H)  4.8 - 5.6 %    Comment: (NOTE)         Prediabetes: 5.7 - 6.4         Diabetes: >6.4         Glycemic control for adults with diabetes: <7.0    Mean Plasma Glucose 134 mg/dL    Comment: (NOTE) Performed At: [facility] [address] [place], [place] [id number] Pearlean Botts MD OZ:[id number]   Urine rapid drug screen (hosp performed)     Status: None   Collection Time: 11/20/23 10:15 PM  Result Value Ref Range   Opiates NONE DETECTED NONE DETECTED   Cocaine NONE DETECTED NONE DETECTED   Benzodiazepines NONE DETECTED NONE DETECTED   Amphetamines NONE DETECTED NONE DETECTED   Tetrahydrocannabinol NONE DETECTED NONE DETECTED   Barbiturates NONE DETECTED NONE DETECTED    Comment: (NOTE) DRUG SCREEN FOR MEDICAL PURPOSES ONLY.  IF CONFIRMATION IS NEEDED FOR ANY PURPOSE, NOTIFY LAB WITHIN 5 DAYS.  LOWEST DETECTABLE LIMITS FOR URINE DRUG SCREEN Drug Class                     Cutoff (ng/mL) Amphetamine and metabolites    1000 Barbiturate and metabolites    200 Benzodiazepine                 200 Opiates  and metabolites        300 Cocaine and metabolites        300 THC                            50 Performed at [facility], [address], [place], [place] [address]   Urinalysis, Routine w reflex microscopic -Urine, Catheterized     Status: Abnormal   Collection Time: 11/20/23 10:15 PM  Result Value Ref Range   Color, Urine YELLOW YELLOW   APPearance TURBID (A) CLEAR   Specific Gravity, Urine 1.014 1.005 - 1.030   pH 7.0 5.0 - 8.0   Glucose, UA NEGATIVE NEGATIVE mg/dL   Hgb urine dipstick SMALL (A) NEGATIVE   Bilirubin Urine NEGATIVE NEGATIVE   Ketones, ur NEGATIVE NEGATIVE mg/dL   Protein, ur 841 (A) NEGATIVE mg/dL   Nitrite NEGATIVE NEGATIVE   Leukocytes,Ua MODERATE (A) NEGATIVE   RBC / HPF 21-50 0 - 5 RBC/hpf   WBC, UA >50 0 - 5 WBC/hpf   Bacteria, UA FEW (A) NONE SEEN   Squamous Epithelial / HPF 0-5 0 - 5 /HPF   WBC Clumps PRESENT    Mucus PRESENT    Non Squamous Epithelial 0-5 (A) NONE SEEN    Comment: Performed at [facility], [address], [place], [place] [address]  Urine Culture (for pregnant, neutropenic or urologic patients or patients with an indwelling urinary catheter)     Status: Abnormal (Preliminary result)   Collection Time: 11/20/23 10:15 PM   Specimen: In/Out Cath Urine  Result Value Ref Range   Specimen Description      IN/OUT CATH URINE Performed at [facility], [address] [place], [place] [address]    Special Requests      NONE Performed at [facility], [address], [place], [place] [address]    Culture (A)     >=100,000 COLONIES/mL GRAM NEGATIVE RODS SUSCEPTIBILITIES TO FOLLOW CULTURE REINCUBATED FOR BETTER GROWTH Performed at [facility], [address] [place], [place] [address]    Report Status PENDING   I-stat chem  8, ED     Status: Abnormal   Collection Time: 11/20/23 10:30 PM  Result Value Ref Range   Sodium 130 (L) 135 - 145 mmol/L   Potassium 3.9 3.5 - 5.1 mmol/L   Chloride 94 (L) 98 - 111 mmol/L   BUN 59 (H) 8 - 23  mg/dL   Creatinine, Ser 4.78 (H) 0.44 - 1.00 mg/dL   Glucose, Bld 295 (H) 70 - 99 mg/dL    Comment: Glucose reference range applies only to samples taken after fasting for at least 8 hours.   Calcium , Ion 0.92 (L) 1.15 - 1.40 mmol/L   TCO2 24 22 - 32 mmol/L   Hemoglobin 9.9 (L) 12.0 - 15.0 g/dL   HCT 62.1 (L) 30.8 - 65.7 %  TSH     Status: None   Collection Time: 11/20/23 11:59 PM  Result Value Ref Range   TSH 1.353 0.350 - 4.500 uIU/mL    Comment: Performed by a 3rd Generation assay with a functional sensitivity of <=0.01 uIU/mL. Performed at [facility], [address] [place], [place] [address]   Vitamin B12     Status: None   Collection Time: 11/20/23 11:59 PM  Result Value Ref Range   Vitamin B-12 586 180 - 914 pg/mL    Comment: (NOTE) This assay is not validated for testing neonatal or myeloproliferative syndrome specimens for Vitamin B12 levels. Performed at [facility], [address] [place], [place] [address]   T4, free     Status: None   Collection Time: 11/20/23 11:59 PM  Result Value Ref Range   Free T4 1.06 0.61 - 1.12 ng/dL    Comment: (NOTE) Biotin ingestion may interfere with free T4 tests. If the results are inconsistent with the TSH level, previous test results, or the clinical presentation, then consider biotin interference. If needed, order repeat testing after stopping biotin. Performed at [facility], [address], [place], [place] [address]   Troponin I (High Sensitivity)     Status: Abnormal   Collection Time: 11/20/23 11:59 PM  Result Value Ref Range   Troponin I (High Sensitivity) 31 (H) <18 ng/L    Comment: (NOTE) Elevated high sensitivity troponin I (hsTnI) values and significant  changes across serial measurements may suggest ACS but many other  chronic and acute conditions are known to elevate hsTnI results.  Refer to the Links section for chest pain algorithms and additional  guidance. Performed at [facility], [address] [place], [place] [address]   CBG monitoring, ED     Status: Abnormal   Collection Time: 11/21/23 12:05 AM  Result Value Ref Range   Glucose-Capillary 178 (H) 70 - 99 mg/dL    Comment: Glucose reference range applies only to samples taken after fasting for at least 8 hours.  POC CBG, ED     Status: None   Collection Time: 11/21/23  3:16 AM  Result Value Ref Range   Glucose-Capillary 71 70 - 99 mg/dL    Comment: Glucose reference range applies only to samples taken after fasting for at least 8 hours.  Lipid panel     Status: None   Collection Time: 11/21/23  5:48 AM  Result Value Ref Range   Cholesterol 125 0 - 200 mg/dL   Triglycerides 36 <725 mg/dL   HDL 67 >36 mg/dL   Total CHOL/HDL Ratio 1.9 RATIO   VLDL 7 0 - 40 mg/dL   LDL Cholesterol 51 0 - 99 mg/dL  Comment:        Total Cholesterol/HDL:CHD Risk Coronary Heart Disease Risk Table                     Men   Women  1/2 Average Risk   3.4   3.3  Average Risk       5.0   4.4  2 X Average Risk   9.6   7.1  3 X Average Risk  23.4   11.0        Use the calculated Patient Ratio above and the CHD Risk Table to determine the patient's CHD Risk.        ATP III CLASSIFICATION (LDL):  <100     mg/dL   Optimal  295-621  mg/dL   Near or Above                    Optimal  130-159  mg/dL   Borderline  308-657  mg/dL   High  >846     mg/dL   Very High Performed at [facility], [address], [place], [place] [address]   CBC     Status: Abnormal   Collection Time: 11/21/23  5:48 AM  Result Value Ref Range   WBC 10.7 (H) 4.0 - 10.5 K/uL   RBC 3.35 (L) 3.87 - 5.11 MIL/uL   Hemoglobin 10.4 (L) 12.0 - 15.0 g/dL   HCT 28.4 (L) 13.2 - 44.0 %   MCV 96.1 80.0 - 100.0 fL   MCH 31.0 26.0 - 34.0 pg   MCHC 32.3 30.0 - 36.0 g/dL   RDW 10.2 (H) 72.5 - 36.6 %   Platelets 180 150 - 400 K/uL   nRBC 0.0 0.0 - 0.2 %    Comment: Performed at [facility], [address], [place], [place] [address]  Basic metabolic panel     Status:  Abnormal   Collection Time: 11/21/23  5:48 AM  Result Value Ref Range   Sodium 134 (L) 135 - 145 mmol/L   Potassium 4.2 3.5 - 5.1 mmol/L   Chloride 98 98 - 111 mmol/L   CO2 24 22 - 32 mmol/L   Glucose, Bld 61 (L) 70 - 99 mg/dL    Comment: Glucose reference range applies only to samples taken after fasting for at least 8 hours.   BUN 70 (H) 8 - 23 mg/dL   Creatinine, Ser 7.42 (H) 0.44 - 1.00 mg/dL   Calcium  8.1 (L) 8.9 - 10.3 mg/dL   GFR, Estimated 7 (L) >60 mL/min    Comment: (NOTE) Calculated using the CKD-EPI Creatinine Equation (2021)    Anion gap 12 5 - 15    Comment: Performed at [facility], [address], [place], [place] [address]  CBG monitoring, ED     Status: Abnormal   Collection Time: 11/21/23  6:01 AM  Result Value Ref Range   Glucose-Capillary 61 (L) 70 - 99 mg/dL    Comment: Glucose reference range applies only to samples taken after fasting for at least 8 hours.  CBG monitoring, ED     Status: Abnormal   Collection Time: 11/21/23  6:38 AM  Result Value Ref Range   Glucose-Capillary 138 (H) 70 - 99 mg/dL    Comment: Glucose reference range applies only to samples taken after fasting for at least 8 hours.  CBG monitoring, ED     Status: None   Collection Time: 11/21/23  7:50 AM  Result Value Ref Range  Glucose-Capillary 85 70 - 99 mg/dL    Comment: Glucose reference range applies only to samples taken after fasting for at least 8 hours.  CBG monitoring, ED     Status: None   Collection Time: 11/21/23  8:57 AM  Result Value Ref Range   Glucose-Capillary 75 70 - 99 mg/dL    Comment: Glucose reference range applies only to samples taken after fasting for at least 8 hours.  CBG monitoring, ED     Status: None   Collection Time: 11/21/23 12:10 PM  Result Value Ref Range   Glucose-Capillary 70 70 - 99 mg/dL    Comment: Glucose reference range applies only to samples taken after fasting for at least 8 hours.  Glucose, capillary     Status: Abnormal   Collection  Time: 11/21/23  4:19 PM  Result Value Ref Range   Glucose-Capillary 61 (L) 70 - 99 mg/dL    Comment: Glucose reference range applies only to samples taken after fasting for at least 8 hours.  Hepatitis B surface antigen     Status: None   Collection Time: 11/21/23  5:10 PM  Result Value Ref Range   Hepatitis B Surface Ag NON REACTIVE NON REACTIVE    Comment: Performed at [facility], [address] [place], [place] [address]  Hepatitis B surface antibody,quantitative     Status: Abnormal   Collection Time: 11/21/23  5:10 PM  Result Value Ref Range   Hep B S AB Quant (Post) <3.5 (L) Immunity>10 mIU/mL    Comment: (NOTE)  Status of Immunity                     Anti-HBs Level  ------------------                     -------------- Inconsistent with Immunity                  0.0 - 10.0 Consistent with Immunity                         >10.0 Performed At: [facility] [address] [place], [place] [id number] Pearlean Botts MD [id number]   Glucose, capillary     Status: Abnormal   Collection Time: 11/21/23  8:48 PM  Result Value Ref Range   Glucose-Capillary 137 (H) 70 - 99 mg/dL    Comment: Glucose reference range applies only to samples taken after fasting for at least 8 hours.  Glucose, capillary     Status: Abnormal   Collection Time: 11/22/23 12:06 AM  Result Value Ref Range   Glucose-Capillary 175 (H) 70 - 99 mg/dL    Comment: Glucose reference range applies only to samples taken after fasting for at least 8 hours.  Renal function panel     Status: Abnormal   Collection Time: 11/22/23  3:48 AM  Result Value Ref Range   Sodium 134 (L) 135 - 145 mmol/L   Potassium 3.7 3.5 - 5.1 mmol/L   Chloride 98 98 - 111 mmol/L   CO2 28 22 - 32 mmol/L   Glucose, Bld 68 (L) 70 - 99 mg/dL    Comment: Glucose reference range applies only to samples taken after fasting for at least 8 hours.   BUN 46 (H) 8 - 23 mg/dL   Creatinine, Ser 3.08 (H) 0.44 - 1.00 mg/dL   Calcium  8.0  (L) 8.9 - 10.3 mg/dL   Phosphorus 4.9 (  H) 2.5 - 4.6 mg/dL   Albumin 2.1 (L) 3.5 - 5.0 g/dL   GFR, Estimated 9 (L) >60 mL/min    Comment: (NOTE) Calculated using the CKD-EPI Creatinine Equation (2021)    Anion gap 8 5 - 15    Comment: Performed at [facility], [address], [place], [place] [address]  CBC     Status: Abnormal   Collection Time: 11/22/23  3:48 AM  Result Value Ref Range   WBC 8.5 4.0 - 10.5 K/uL   RBC 3.06 (L) 3.87 - 5.11 MIL/uL   Hemoglobin 9.2 (L) 12.0 - 15.0 g/dL   HCT 62.1 (L) 30.8 - 65.7 %   MCV 97.4 80.0 - 100.0 fL   MCH 30.1 26.0 - 34.0 pg   MCHC 30.9 30.0 - 36.0 g/dL   RDW 84.6 (H) 96.2 - 95.2 %   Platelets 218 150 - 400 K/uL   nRBC 0.0 0.0 - 0.2 %    Comment: Performed at [facility], [address] [place], [place] [address]  Magnesium      Status: None   Collection Time: 11/22/23  3:48 AM  Result Value Ref Range   Magnesium  2.0 1.7 - 2.4 mg/dL    Comment: Performed at [facility], [address] [place], [place] [address]  Glucose, capillary     Status: None   Collection Time: 11/22/23  4:09 AM  Result Value Ref Range   Glucose-Capillary 70 70 - 99 mg/dL    Comment: Glucose reference range applies only to samples taken after fasting for at least 8 hours.  Glucose, capillary     Status: None   Collection Time: 11/22/23  7:43 AM  Result Value Ref Range   Glucose-Capillary 86 70 - 99 mg/dL    Comment: Glucose reference range applies only to samples taken after fasting for at least 8 hours.   EEG adult Result Date: 11/21/2023 Arleene Lack, MD     11/21/2023  5:33 PM Patient Name: TAHLIA DEAMER MRN: [id number] Epilepsy Attending: Arleene Lack Referring Physician/Provider: Arleene Lack, MD Date: 11/21/2023 Duration: 23.40 mins Patient history: 68 year old female presented with altered mental status and left-sided weakness. EEG to evaluate for seizure Level of alertness: Awake, asleep AEDs during EEG study: None Technical aspects: This EEG  study was done with scalp electrodes positioned according to the 10-20 International system of electrode placement. Electrical activity was reviewed with band pass filter of 1-70Hz , sensitivity of 7 uV/mm, display speed of 66mm/sec with a 60Hz  notched filter applied as appropriate. EEG data were recorded continuously and digitally stored.  Video monitoring was available and reviewed as appropriate. Description: The posterior dominant rhythm consists of 6 Hz activity of moderate voltage (25-35 uV) seen predominantly in posterior head regions, symmetric and reactive to eye opening and eye closing. Sleep was characterized by vertex waves, sleep spindles (12 to 14 Hz), maximal frontocentral region. EEG showed continuous generalized predominantly 5  to 6 Hz theta-delta slowing admixed with intermittent 2-3hz  delta slowing.  Physiologic photic driving was not seen during photic stimulation.  Hyperventilation was not performed.   ABNORMALITY - Continuous slow, generalized IMPRESSION: This study is suggestive of moderate diffuse encephalopathy. No seizures or epileptiform discharges were seen throughout the recording. Arleene Lack   ECHOCARDIOGRAM COMPLETE Result Date: 11/21/2023    ECHOCARDIOGRAM REPORT   Patient Name:   AUTRY DROEGE Date of Exam: 11/21/2023 Medical Rec #:  [id number]     Height:  67.0 in Accession #:    [id number]    Weight:       209.7 lb Date of Birth:  [birth year]    BSA:          2.063 m Patient Age:    67 years      BP:           116/58 mmHg Patient Gender: F             HR:           77 bpm. Exam Location:  Inpatient Procedure: 2D Echo, Cardiac Doppler and Color Doppler (Both Spectral and Color            Flow Doppler were utilized during procedure). Indications:    Stroke I63.9  History:        Patient has prior history of Echocardiogram examinations, most                 recent 06/16/2023.  Sonographer:    Hersey Lorenzo RDCS Referring Phys: [id number] TIMOTHY S OPYD IMPRESSIONS  1. Left  ventricular ejection fraction, by estimation, is 65 to 70%. The left ventricle has normal function. The left ventricle has no regional wall motion abnormalities. Left ventricular diastolic parameters are indeterminate.  2. Right ventricular systolic function is normal. The right ventricular size is normal. Tricuspid regurgitation signal is inadequate for assessing PA pressure.  3. The mitral valve is grossly normal. Trivial mitral valve regurgitation.  4. The aortic valve is tricuspid. There is mild calcification of the aortic valve. Aortic valve regurgitation is not visualized. Aortic valve sclerosis/calcification is present, without any evidence of aortic stenosis.  5. The inferior vena cava is normal in size with greater than 50% respiratory variability, suggesting right atrial pressure of 3 mmHg. Comparison(s): Prior images reviewed side by side. LVEF 65-70% range. Mildly calcified aortic valve without stenosis. Interatrial septum bows to the right, no obvious shunting. FINDINGS  Left Ventricle: Left ventricular ejection fraction, by estimation, is 65 to 70%. The left ventricle has normal function. The left ventricle has no regional wall motion abnormalities. The left ventricular internal cavity size was normal in size. There is  borderline concentric left ventricular hypertrophy. Left ventricular diastolic parameters are indeterminate. Right Ventricle: The right ventricular size is normal. No increase in right ventricular wall thickness. Right ventricular systolic function is normal. Tricuspid regurgitation signal is inadequate for assessing PA pressure. Left Atrium: Left atrial size was normal in size. Right Atrium: Right atrial size was normal in size. Pericardium: There is no evidence of pericardial effusion. Mitral Valve: The mitral valve is grossly normal. Trivial mitral valve regurgitation. Tricuspid Valve: The tricuspid valve is grossly normal. Tricuspid valve regurgitation is trivial. Aortic Valve: The  aortic valve is tricuspid. There is mild calcification of the aortic valve. There is mild aortic valve annular calcification. Aortic valve regurgitation is not visualized. Aortic valve sclerosis/calcification is present, without any evidence of aortic stenosis. Pulmonic Valve: The pulmonic valve was grossly normal. Pulmonic valve regurgitation is trivial. Aorta: The aortic root and ascending aorta are structurally normal, with no evidence of dilitation. Venous: The inferior vena cava is normal in size with greater than 50% respiratory variability, suggesting right atrial pressure of 3 mmHg. IAS/Shunts: There is right bowing of the interatrial septum, suggestive of elevated left atrial pressure. No atrial level shunt detected by color flow Doppler. Additional Comments: 3D was performed not requiring image post processing on an independent workstation and was indeterminate.  LEFT VENTRICLE PLAX  2D LVIDd:         5.10 cm   Diastology LVIDs:         3.30 cm   LV e' medial:    8.59 cm/s LV PW:         1.10 cm   LV E/e' medial:  9.8 LV IVS:        1.00 cm   LV e' lateral:   9.79 cm/s LVOT diam:     1.90 cm   LV E/e' lateral: 8.6 LV SV:         60 LV SV Index:   29 LVOT Area:     2.84 cm  RIGHT VENTRICLE         IVC TAPSE (M-mode): 1.7 cm  IVC diam: 1.40 cm LEFT ATRIUM             Index        RIGHT ATRIUM           Index LA diam:        3.30 cm 1.60 cm/m   RA Area:     13.70 cm LA Vol (A2C):   33.8 ml 16.38 ml/m  RA Volume:   33.40 ml  16.19 ml/m LA Vol (A4C):   53.4 ml 25.88 ml/m LA Biplane Vol: 47.1 ml 22.83 ml/m  AORTIC VALVE LVOT Vmax:   112.00 cm/s LVOT Vmean:  67.700 cm/s LVOT VTI:    0.211 m  AORTA Ao Root diam: 3.20 cm Ao Asc diam:  3.70 cm MITRAL VALVE MV Area (PHT): 3.00 cm     SHUNTS MV Decel Time: 253 msec     Systemic VTI:  0.21 m MV E velocity: 84.00 cm/s   Systemic Diam: 1.90 cm MV A velocity: 119.00 cm/s MV E/A ratio:  0.71 Teddie Favre MD Electronically signed by Teddie Favre MD Signature  Date/Time: 11/21/2023/3:04:47 PM    Final    MR ANGIO HEAD WO CONTRAST Result Date: 11/21/2023 CLINICAL DATA:  68 year old female neurologic deficit. Code stroke presentation yesterday. 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 EXAM: MRA HEAD WITHOUT CONTRAST TECHNIQUE: Angiographic images of the Circle of Willis were acquired using MRA technique without intravenous contrast. COMPARISON:  Brain MRI today.  CTA head and neck yesterday. FINDINGS: Anterior circulation: Antegrade flow in both ICA siphons. Patent carotid termini. Bilateral siphon irregularity but no significant siphon stenosis. MCA and ACA origins appear normal. Anterior communicating artery and median artery of the corpus callosum are present. Left ACA appears dominant throughout. Severe stenosis of the proximal right ACA A2 on series 1033, image 13. Bilateral MCA M1 segments and bifurcations are patent. Multifocal moderate and severe bilateral MCA branch irregularity and stenosis as seen on series 1033, images 4 and 14. Posterior circulation: Patent distal vertebral arteries, vertebrobasilar junction, basilar artery without stenosis. Patent PCA origins. Posterior communicating arteries are diminutive or absent. Moderate to severe stenosis left PCA P2 segment (series 1021, image 4). Mild contralateral right P1 and P2 segment stenosis. Evidence of moderate and severe PCA P3 segment stenosis, including on the right side series 1021, image 17. Anatomic variants: Median artery of the corpus callosum. Other: Brain MRI today reported separately. IMPRESSION: 1. Negative for large vessel occlusion. 2. Positive for advanced intracranial atherosclerosis. No significant 1st order vessel stenosis, but moderate to severe stenosis in anterior and posterior circulation 2nd order and distal branches. 3. MRI today reported separately. Electronically Signed   By: Marlise Simpers M.D.   On: 11/21/2023 12:11  MR BRAIN WO CONTRAST Result Date: 11/21/2023 CLINICAL  DATA:  68 year old female neurologic deficit. Code stroke presentation yesterday. 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 EXAM: MRI HEAD WITHOUT CONTRAST TECHNIQUE: Multiplanar, multiecho pulse sequences of the brain and surrounding structures were obtained without intravenous contrast. COMPARISON:  CT head and CTA head and neck yesterday. Brain MRI 05/21/2023. FINDINGS: Brain: No restricted diffusion to suggest acute infarction. No midline shift, mass effect, evidence of mass lesion, ventriculomegaly, extra-axial collection or acute intracranial hemorrhage. Cervicomedullary junction and pituitary are within normal limits. Numerous chronic microhemorrhages, concentrated in the brainstem, periventricular white matter, right mesial temporal lobe as before. Superimposed advanced, confluent bilateral cerebral white matter T2 and FLAIR hyperintensity. Chronic lacunar infarcts in the bilateral deep white matter capsules, bilateral deep gray nuclei, brainstem, bilateral cerebellum. Regressed mild brainstem edema seen in December. No new signal abnormality identified. Vascular: Major intracranial vascular flow voids are stable since last year. MRA today is reported separately. Skull and upper cervical spine: Stable and negative. Sinuses/Orbits: Stable and negative. Other: Stable trace mastoid effusions. Negative visible scalp and face. IMPRESSION: 1. No acute intracranial abnormality. 2. Expected evolution of small brainstem hemorrhage since December. And otherwise stable severe underlying chronic small vessel disease. 3. MRA reported separately. Electronically Signed   By: Marlise Simpers M.D.   On: 11/21/2023 12:07   CT ANGIO HEAD NECK W WO CM (CODE STROKE) Result Date: 11/20/2023 CLINICAL DATA:  Initial evaluation for acute neuro deficit, stroke suspected. EXAM: CT ANGIOGRAPHY HEAD AND NECK WITH AND WITHOUT CONTRAST TECHNIQUE: Multidetector CT imaging of the head and neck was performed using the standard protocol  during bolus administration of intravenous contrast. Multiplanar CT image reconstructions and MIPs were obtained to evaluate the vascular anatomy. Carotid stenosis measurements (when applicable) are obtained utilizing NASCET criteria, using the distal internal carotid diameter as the denominator. RADIATION DOSE REDUCTION: This exam was performed according to the departmental dose-optimization program which includes automated exposure control, adjustment of the mA and/or kV according to patient size and/or use of iterative reconstruction technique. CONTRAST:  75mL OMNIPAQUE  IOHEXOL  350 MG/ML SOLN COMPARISON:  Comparison made with head CT from earlier the same day as well as prior CTA from 05/20/2023. FINDINGS: CTA NECK FINDINGS Aortic arch: Visualized aortic arch within normal limits for caliber with standard 3 vessel morphology. Aortic atherosclerosis. No significant stenosis about the origin the great vessels. Right carotid system: Right common and internal carotid arteries are patent without dissection. Mild atheromatous change about the right carotid bulb without hemodynamically significant greater than 50% stenosis. Left carotid system: Left common and internal carotid arteries are patent without dissection. Mild atheromatous change about the left carotid bulb without hemodynamically significant stenosis. Vertebral arteries: Both vertebral arteries arise from subclavian arteries. Vertebral arteries are patent without stenosis or dissection. Skeleton: No worrisome osseous lesions. Other neck: No other acute finding. Upper chest: No other acute finding. Review of the MIP images confirms the above findings CTA HEAD FINDINGS Anterior circulation: Atheromatous change about the carotid siphons with associated mild to moderate multifocal narrowing bilaterally. A1 segments patent bilaterally. Normal anterior communicating artery complex. Anterior cerebral arteries patent without significant stenosis. No M1 stenosis or  occlusion. Distal MCA branches perfused and symmetric. Posterior circulation: Both V4 segments patent without stenosis. Both PICA patent at their origins. Basilar patent without stenosis. Superior cerebral arteries patent bilaterally. Both PCAs primarily supplied via the basilar. Atheromatous irregularity about the PCAs bilaterally the with associated mild to moderate bilateral P2 stenoses. PCAs remain  patent to their distal aspects. Venous sinuses: Patent allowing for timing the contrast bolus. Anatomic variants: None significant.  No aneurysm. Review of the MIP images confirms the above findings IMPRESSION: 1. Negative CTA for large vessel occlusion or other emergent finding. 2. Intracranial atherosclerotic disease, most notably about the carotid siphons and PCAs where there are associated mild to moderate multifocal stenoses. 3. Mild atheromatous change about the carotid bifurcations without significant stenosis. No hemodynamically significant stenosis within the neck. 4. Aortic Atherosclerosis (ICD10-I70.0). Results were called by telephone at the time of interpretation on 11/20/2023 at 10:18 pm to provider [facility] , who verbally acknowledged these results. Electronically Signed   By: Virgia Griffins M.D.   On: 11/20/2023 22:19   CT HEAD CODE STROKE WO CONTRAST Result Date: 11/20/2023 CLINICAL DATA:  Code stroke. Initial evaluation for acute neuro deficit, stroke suspected. EXAM: CT HEAD WITHOUT CONTRAST TECHNIQUE: Contiguous axial images were obtained from the base of the skull through the vertex without intravenous contrast. RADIATION DOSE REDUCTION: This exam was performed according to the departmental dose-optimization program which includes automated exposure control, adjustment of the mA and/or kV according to patient size and/or use of iterative reconstruction technique. COMPARISON:  Prior study from 05/20/2023 FINDINGS: Brain: Generalized age-related cerebral atrophy. Patchy hypodensity  involving the supratentorial cerebral white matter, consistent chronic small vessel ischemic disease, advanced in nature. Remote right basal ganglia lacunar infarct. Additional chronic left cerebellar infarct. No acute intracranial hemorrhage. No acute large vessel territory infarct. No mass lesion or midline shift. No hydrocephalus or extra-axial fluid collection. Vascular: No abnormal hyperdense vessel. Calcified atherosclerosis present about the skull base. Skull: Scalp soft tissues within normal limits.  Calvarium intact. Sinuses/Orbits: Globes and orbital soft tissues within normal limits. Small osteoma noted within the right ethmoidal air cells. Paranasal sinuses are largely clear. No significant mastoid effusion. Other: None. ASPECTS [facility] Stroke Program Early CT Score) - Ganglionic level infarction (caudate, lentiform nuclei, internal capsule, insula, M1-M3 cortex): 7 - Supraganglionic infarction (M4-M6 cortex): 3 Total score (0-10 with 10 being normal): 10 IMPRESSION: 1. No acute intracranial abnormality. 2. ASPECTS is 10. 3. Atrophy with advanced chronic microvascular ischemic disease, with chronic right basal ganglia and left cerebellar infarcts. Results were called by telephone at the time of interpretation on 11/20/2023 at 9:55 pm to provider [facility] , who verbally acknowledged these results. Electronically Signed   By: Virgia Griffins M.D.   On: 11/20/2023 22:00    PMH:   Past Medical History:  Diagnosis Date   CAD (coronary artery disease)    cath 5/23 100% dist RCA lesion treated with 2 overlapping Integrity Resolute DES ( 2.25x25mm, 2.25x70mm), 60% mid RCA treated medically, 99% OM2 not amenable to PCI, 70% D1 lesion, EF normal   Hypercholesteremia    Hypertension    Hypothyroidism    Kidney disease    Renal disorder    Stroke (HCC) 10/2018   Thyroid  disease    Type 2 diabetes mellitus (HCC) 10/08/2018    PSH:   Past Surgical History:  Procedure Laterality Date    ABDOMINAL HYSTERECTOMY     AV FISTULA PLACEMENT Right 06/28/2022   Procedure: RIGHT ARM ARTERIOVENOUS (AV) FISTULA CREATION;  Surgeon: Mayo Speck, MD;  Location: [facility];  Service: Vascular;  Laterality: Right;   CARDIAC CATHETERIZATION N/A 11/03/2015   Procedure: Left Heart Cath and Coronary Angiography;  Surgeon: Arleen Lacer, MD;  Location: [facility] INVASIVE CV LAB;  Service: Cardiovascular;  Laterality: N/A;   CARDIAC  CATHETERIZATION N/A 11/03/2015   Procedure: Coronary Stent Intervention;  Surgeon: Arleen Lacer, MD;  [facility] INVASIVE CV LAB;  Service: Cardiovascular;  Laterality: N/A;   CARPAL TUNNEL RELEASE     CORONARY ANGIOPLASTY     1 stent   FISTULA SUPERFICIALIZATION Right 08/16/2022   Procedure: RIGHT ARTERIOVENOUS FISTULA SUPERFICIALIZATION;  Surgeon: Mayo Speck, MD;  Location: [facility];  Service: Vascular;  Laterality: Right;   TOTAL KNEE ARTHROPLASTY Right 04/02/2019   Procedure: TOTAL KNEE ARTHROPLASTY;  Surgeon: Darrin Emerald, MD;  Location: [facility];  Service: Orthopedics;  Laterality: Right;    Allergies:  Allergies  Allergen Reactions   Metformin Diarrhea   Tenecteplase  Other (See Comments)    Unknown     Medications:   Prior to Admission medications   Medication Sig Start Date End Date Taking? Authorizing Provider  aspirin  81 MG chewable tablet Chew 1 tablet (81 mg total) by mouth daily. 05/24/23  Yes de Thayne Fine, Cortney E, NP  atorvastatin  (LIPITOR ) 80 MG tablet TAKE 1 TABLET BY MOUTH ONCE DAILY AT  6  IN  THE  EVENING 09/06/23  Yes Mallipeddi, Vishnu P, MD  buPROPion  (WELLBUTRIN  SR) 150 MG 12 hr tablet Take 1 tablet by mouth twice daily 08/02/23  Yes [name], Manley Seeds, MD  carvedilol  (COREG ) 12.5 MG tablet Take 1 tablet (12.5 mg total) by mouth daily. On non-dialysis days Patient taking differently: Take 12.5 mg by mouth at bedtime. On non-dialysis days 05/24/23  Yes de Thayne Fine, Cortney E, NP  doxycycline  (MONODOX ) 100 MG capsule Take 100 mg by mouth 2 (two)  times daily. For a month for eye infection   Yes [provider]  DULoxetine  (CYMBALTA ) 60 MG capsule Take 1 capsule by mouth once daily 07/19/23  Yes [name], Manley Seeds, MD  ferrous sulfate  325 (65 FE) MG tablet Take 325 mg by mouth as needed (given when needed at dialysis).   Yes [provider]  furosemide  (LASIX ) 80 MG tablet Take 80 mg by mouth daily. On non-dialysis days 10/27/22  Yes [provider]  insulin  lispro protamine-lispro (HUMALOG  75/25 MIX) (75-25) 100 UNIT/ML SUSP injection Inject 10-25 Units into the skin 3 (three) times daily as needed (high bs). Inject 25 units with breakfast, 10 units at lunch, 25 units at dinner   Yes [provider]  iron sucrose 100 mg in sodium chloride  0.9 % 100 mL Inject 100 mg into the vein as needed (iron-deficiency anemia). Venofer   Yes [provider]  levothyroxine  (SYNTHROID ) 175 MCG tablet Take 175 mcg by mouth daily before breakfast.   Yes [provider]  lidocaine -prilocaine  (EMLA ) cream Apply 1 Application topically 4 (four) times a week.   Yes [provider]  memantine  (NAMENDA ) 10 MG tablet Take 1 tablet (10 mg total) by mouth 2 (two) times daily. 06/22/23  Yes Sethi, Pramod S, MD  Methoxy PEG-Epoetin Beta (MIRCERA) 200 MCG/0.3ML SOSY Inject 200 mcg as directed as needed (low RBCs). Mircera   Yes [provider]  nitroGLYCERIN  (NITROSTAT ) 0.4 MG SL tablet Place 0.4 mg under the tongue every 5 (five) minutes as needed for chest pain.   Yes [provider]  sevelamer carbonate (RENVELA) 0.8 g PACK packet Take 0.8 g by mouth 3 (three) times daily with meals.   Yes [provider]  tobramycin (TOBREX) 0.3 % ophthalmic solution Place 1 drop into the right eye every 2 (two) hours.   Yes [provider]  BD PEN  NEEDLE NANO 2ND GEN 32G X 4 MM MISC Inject into the skin 3 (three) times daily. 07/22/21   [provider]    Discontinued Meds:   Medications  Discontinued During This Encounter  Medication Reason   polyvinyl alcohol  (LIQUIFILM TEARS) 1.4 % ophthalmic solution Patient Preference   cyclopentolate (CYCLODRYL,CYCLOGYL) 1 % ophthalmic solution Completed Course   artificial tears (LACRILUBE) OINT ophthalmic ointment Patient Preference   folic acid  (FOLVITE ) 1 MG tablet Patient Preference   cephALEXin  (KEFLEX ) 500 MG capsule Completed Course   buPROPion  (WELLBUTRIN  SR) 12 hr tablet 150 mg    DULoxetine  (CYMBALTA ) DR capsule 60 mg    memantine  (NAMENDA ) tablet 10 mg    insulin  glargine-yfgn (SEMGLEE ) injection 25 Units    doxycycline  (VIBRA -TABS) tablet 100 mg     Social History:  reports that she quit smoking about 2 years ago. Her smoking use included cigarettes. She has never used smokeless tobacco. She reports that she does not drink alcohol  and does not use drugs.  Family History:   Family History  Problem Relation Age of Onset   Heart disease Mother    Stroke Sister    Heart attack Brother    Breast cancer Neg Hx     Blood pressure (!) 131/44, pulse 80, temperature 98.7 F (37.1 C), temperature source Oral, resp. rate 14, height 5' 7 (1.702 m), weight 92.9 kg, SpO2 97%. Physical Exam: General appearance: fatigued and alert, answering questionsappropriately Head: NCAT, right facial droop Eyes: EOMI Resp: CTA b/l Cardio: RRR GI: SNDNT+BS Extremities: tr LE edema Dialysis Access: RUE BCF great thrill even bandages on     Tiara Bartoli, Alveda Aures, MD 11/22/2023, 10:17 AM

## 2023-11-22 NOTE — Progress Notes (Signed)
 Physical Therapy Treatment Patient Details Name: Jennifer [name] MRN: [id number] DOB: 1955-07-14 Today's Date: 11/22/2023   History of Present Illness Jennifer [name] is a 68 y.o. female with medical history significant for hypertension, type 2 diabetes mellitus, hypothyroidism, ESRD on hemodialysis, CAD, depression, anxiety, memory loss, and history of CVA with residual deficits who presents with acute onset of left-sided weakness and speech difficulty.     Patient fell today and when her daughter was trying to assist her back up, she noted that the patient had new left-sided weakness, confusion, and difficulty with her speech.  She had been in her usual state at approximately 5 PM.    PT Comments  Pt tolerated today's treatment session, well with poor carryover and following commands. Today's session addressed reducing assist during functional bed mobility and increased sitting EOB tolerance. Pt noted with continued poor movement patterns, motor planning, and control with max assist for bed mobiltiy this session. Standing was not indicated due to poor attempts for lateral scooting and pt complaining of too much knee pain. Pt would continue to benefit from skilled acute physical therapy services in order to progress toward POC goals, safety/independence with functional mobility and QOL.     If plan is discharge home, recommend the following: A lot of help with bathing/dressing/bathroom;A lot of help with walking and/or transfers;Help with stairs or ramp for entrance;Assistance with cooking/housework   Can travel by private vehicle     No  Equipment Recommendations  None recommended by PT    Recommendations for Other Services       Precautions / Restrictions Precautions Precautions: Fall Recall of Precautions/Restrictions: Impaired Restrictions Weight Bearing Restrictions Per Provider Order: No     Mobility  Bed Mobility Overal bed mobility: Needs Assistance Bed Mobility: Supine to Sit,  Sit to Supine     Supine to sit: Max assist, HOB elevated Sit to supine: Max assist, HOB elevated   General bed mobility comments: slow labored movement, poor carryover and poor motor control with single command verbal and tactile cues. Max facilitation of movement patterns leading wtih L side into sitting EOB and back in supine position. Superior scooting in bed with use of trendelenberg at total assist.    Transfers                   General transfer comment: Not attempted today, too much pain in R knee.    Ambulation/Gait                   Stairs             Wheelchair Mobility     Tilt Bed    Modified Rankin (Stroke Patients Only)       Balance Overall balance assessment: Needs assistance Sitting-balance support: Feet supported, No upper extremity supported Sitting balance-Leahy Scale: Poor Sitting balance - Comments: fair/poor seated at EOB       Standing balance comment: Not attempted today.                            Communication Communication Communication: Impaired Factors Affecting Communication: Difficulty expressing self  Cognition Arousal: Lethargic Behavior During Therapy: Flat affect   PT - Cognitive impairments: No apparent impairments                         Following commands: Impaired Following commands impaired: Follows one step commands inconsistently  Cueing Cueing Techniques: Verbal cues, Tactile cues  Exercises General Exercises - Lower Extremity Long Arc Quad: Both, 10 reps, Strengthening, Seated    General Comments        Pertinent Vitals/Pain Pain Assessment Pain Assessment: Faces Faces Pain Scale: Hurts whole lot Pain Location: Right knee Pain Descriptors / Indicators: Moaning Pain Intervention(s): Monitored during session, Repositioned    Home Living                          Prior Function            PT Goals (current goals can now be found in the care  plan section) Acute Rehab PT Goals Patient Stated Goal: return home PT Goal Formulation: With patient Time For Goal Achievement: 12/05/23 Potential to Achieve Goals: Good    Frequency    Min 3X/week      PT Plan      Co-evaluation              AM-PAC PT 6 Clicks Mobility   Outcome Measure  Help needed turning from your back to your side while in a flat bed without using bedrails?: A Lot Help needed moving from lying on your back to sitting on the side of a flat bed without using bedrails?: A Lot Help needed moving to and from a bed to a chair (including a wheelchair)?: A Lot Help needed standing up from a chair using your arms (e.g., wheelchair or bedside chair)?: A Lot Help needed to walk in hospital room?: A Lot Help needed climbing 3-5 steps with a railing? : Total 6 Click Score: 11    End of Session Equipment Utilized During Treatment: Gait belt Activity Tolerance: Patient tolerated treatment well;Patient limited by fatigue Patient left: in bed;with call bell/phone within reach Nurse Communication: Mobility status PT Visit Diagnosis: Unsteadiness on feet (R26.81);Other abnormalities of gait and mobility (R26.89);Muscle weakness (generalized) (M62.81)     Time: 1610-9604 PT Time Calculation (min) (ACUTE ONLY): 19 min  Charges:    $Therapeutic Activity: 8-22 mins PT General Charges $$ ACUTE PT VISIT: 1 Visit                     Astrid Lay, DPT [facility] [place] [phone] office   Gatha Kaska 11/22/2023, 9:49 AM

## 2023-11-22 NOTE — TOC Progression Note (Signed)
 Transition of Care [facility]) - Progression Note    Patient Details  Name: Jennifer [name] MRN: [id number] Date of Birth: [birth year]  Transition of Care [facility]) CM/SW Contact  Orelia Binet, RN Phone Number: 11/22/2023, 1:10 PM  Clinical Narrative:   TOC is working with [facility] to get a chair time in [place]. [facility] is considering and has transportation for HD on MWF. We just need to confirm that the patient can transport in a wheelchair. Patient will get HD today, TOC following.     PASS[facility] # [id number] E received.    Expected Discharge Plan: Skilled Nursing Facility Barriers to Discharge: Continued Medical Work up  Expected Discharge Plan and Services       Living arrangements for the past 2 months: Single Family Home                      Social Determinants of Health (SDOH) Interventions SDOH Screenings   Food Insecurity: No Food Insecurity (11/21/2023)  Housing: Low Risk  (11/21/2023)  Transportation Needs: No Transportation Needs (11/21/2023)  Utilities: Not At Risk (11/21/2023)  Alcohol  Screen: Low Risk  (03/07/2023)  Depression (PHQ2-9): High Risk (05/30/2023)  Financial Resource Strain: Low Risk  (03/07/2023)  Physical Activity: Inactive (03/07/2023)  Social Connections: Socially Integrated (11/21/2023)  Stress: No Stress Concern Present (03/07/2023)  Tobacco Use: Medium Risk (11/20/2023)  Health Literacy: Adequate Health Literacy (03/07/2023)    Readmission Risk Interventions     No data to display

## 2023-11-22 NOTE — Plan of Care (Signed)

## 2023-11-23 DIAGNOSIS — R531 Weakness: Secondary | ICD-10-CM | POA: Diagnosis not present

## 2023-11-23 DIAGNOSIS — R0602 Shortness of breath: Secondary | ICD-10-CM | POA: Insufficient documentation

## 2023-11-23 LAB — MAGNESIUM: Magnesium: 2.2 mg/dL (ref 1.7–2.4)

## 2023-11-23 LAB — CBC
HCT: 28.2 % — ABNORMAL LOW (ref 36.0–46.0)
Hemoglobin: 8.8 g/dL — ABNORMAL LOW (ref 12.0–15.0)
MCH: 30 pg (ref 26.0–34.0)
MCHC: 31.2 g/dL (ref 30.0–36.0)
MCV: 96.2 fL (ref 80.0–100.0)
Platelets: 253 10*3/uL (ref 150–400)
RBC: 2.93 MIL/uL — ABNORMAL LOW (ref 3.87–5.11)
RDW: 15.7 % — ABNORMAL HIGH (ref 11.5–15.5)
WBC: 7.9 10*3/uL (ref 4.0–10.5)
nRBC: 0 % (ref 0.0–0.2)

## 2023-11-23 LAB — GLUCOSE, CAPILLARY
Glucose-Capillary: 102 mg/dL — ABNORMAL HIGH (ref 70–99)
Glucose-Capillary: 113 mg/dL — ABNORMAL HIGH (ref 70–99)
Glucose-Capillary: 134 mg/dL — ABNORMAL HIGH (ref 70–99)
Glucose-Capillary: 82 mg/dL (ref 70–99)
Glucose-Capillary: 84 mg/dL (ref 70–99)
Glucose-Capillary: 92 mg/dL (ref 70–99)

## 2023-11-23 LAB — RENAL FUNCTION PANEL
Albumin: 2.2 g/dL — ABNORMAL LOW (ref 3.5–5.0)
Anion gap: 12 (ref 5–15)
BUN: 66 mg/dL — ABNORMAL HIGH (ref 8–23)
CO2: 26 mmol/L (ref 22–32)
Calcium: 7.9 mg/dL — ABNORMAL LOW (ref 8.9–10.3)
Chloride: 97 mmol/L — ABNORMAL LOW (ref 98–111)
Creatinine, Ser: 5.96 mg/dL — ABNORMAL HIGH (ref 0.44–1.00)
GFR, Estimated: 7 mL/min — ABNORMAL LOW (ref >60)
Glucose, Bld: 91 mg/dL (ref 70–99)
Phosphorus: 6 mg/dL — ABNORMAL HIGH (ref 2.5–4.6)
Potassium: 4.3 mmol/L (ref 3.5–5.1)
Sodium: 135 mmol/L (ref 135–145)

## 2023-11-23 LAB — URINE CULTURE: Culture: >=100000 — AB

## 2023-11-23 MED ORDER — DARBEPOETIN ALFA 100 MCG/0.5ML IJ SOSY
100.0000 ug | PREFILLED_SYRINGE | INTRAMUSCULAR | Status: DC
  Administered 2023-11-23: 100 ug via SUBCUTANEOUS
  Filled 2023-11-23: qty 0.5

## 2023-11-23 MED ORDER — SODIUM CHLORIDE 0.9 % IV SOLN
1.0000 g | INTRAVENOUS | Status: DC
  Administered 2023-11-23 – 2023-11-24 (×2): 1 g via INTRAVENOUS
  Filled 2023-11-23 (×2): qty 10

## 2023-11-23 NOTE — Plan of Care (Signed)
  Problem: Education: Goal: Ability to describe self-care measures that may prevent or decrease complications (Diabetes Survival Skills Education) will improve Outcome: Progressing   Problem: Safety: Goal: Ability to remain free from injury will improve Outcome: Progressing   Problem: Skin Integrity: Goal: Risk for impaired skin integrity will decrease Outcome: Progressing

## 2023-11-23 NOTE — Progress Notes (Signed)
 Nurse at bedside,patient alert to person,place,and confused to time and situation.Patient very sleepy this morning,but arousable.No c/o pain or discomfort noted.Patient was able to take her medications whole in applesauce.Plan of care on going.

## 2023-11-23 NOTE — TOC Progression Note (Signed)
 Transition of Care [facility]) - Progression Note    Patient Details  Name: Jennifer [name] MRN: [id number] Date of Birth: December 05, 1955  Transition of Care [facility]) CM/SW Contact  Orelia Binet, RN Phone Number: 11/23/2023, 2:57 PM  Clinical Narrative:   Patient is in HD in a chair today. [facility] wanted to confirm that she could sit for HD. TOC will update Tonya at [facility] tomorrow. [facility] in [place] is holding a chair time for MWF at 12:00.  Team updated. Plan is to discharge after HD on Sat. TOC following.    Expected Discharge Plan: Skilled Nursing Facility Barriers to Discharge: Continued Medical Work up  Expected Discharge Plan and Services       Living arrangements for the past 2 months: Single Family Home        Social Determinants of Health (SDOH) Interventions SDOH Screenings   Food Insecurity: No Food Insecurity (11/21/2023)  Housing: Low Risk  (11/21/2023)  Transportation Needs: No Transportation Needs (11/21/2023)  Utilities: Not At Risk (11/21/2023)  Alcohol  Screen: Low Risk  (03/07/2023)  Depression (PHQ2-9): High Risk (05/30/2023)  Financial Resource Strain: Low Risk  (03/07/2023)  Physical Activity: Inactive (03/07/2023)  Social Connections: Socially Integrated (11/21/2023)  Stress: No Stress Concern Present (03/07/2023)  Tobacco Use: Medium Risk (11/20/2023)  Health Literacy: Adequate Health Literacy (03/07/2023)    Readmission Risk Interventions     No data to display

## 2023-11-23 NOTE — Progress Notes (Signed)
 SLP Cancellation Note  Patient Details Name: Jennifer [name] MRN: [id number] DOB: December 23, 1955   Cancelled treatment:       Reason Eval/Treat Not Completed: Patient at procedure or test/unavailable (Pt is off floor for dialysis.Continue diet as ordered and recommend f/u SLP services at SNF if she discharges soon.)  Thank you,  Claudetta Cuba, CCC-SLP [phone]  Asjah Rauda 11/23/2023, 1:44 PM

## 2023-11-23 NOTE — Progress Notes (Signed)
 PROGRESS NOTE    Jennifer [name]  ZOX:[id number] DOB: 04-05-1956 DOA: 11/20/2023 PCP: Clemens Curt, MD   Brief Narrative:    Jennifer [name] is a 68 y.o. female with medical history significant for hypertension, type 2 diabetes mellitus, hypothyroidism, ESRD on hemodialysis, CAD, depression, anxiety, memory loss, and history of CVA with residual deficits who presents with acute onset of left-sided weakness and speech difficulty.  Patient has been admitted for evaluation of acute encephalopathy with left-sided weakness.  MRI does not show any signs of acute stroke.  EEG negative for any acute findings.  PT now recommending SNF and patient awaiting placement and receiving hemodialysis in the interim.  Noted to have growth of E. coli and Proteus in urine cultures and started on Rocephin  6/12.  Assessment & Plan:   Principal Problem:   Acute left-sided weakness Active Problems:   COPD (chronic obstructive pulmonary disease) (HCC)   Acquired hypothyroidism   Coronary artery disease involving native coronary artery of native heart without angina pectoris   History of CVA (cerebrovascular accident)   ESRD (end stage renal disease) (HCC)  Assessment and Plan:   1. Left-sided weakness and speech difficulty likely related to E. coli/Proteus UTI - Appreciate neurology evaluation with MRI negative and EEG within normal limits -Started on Rocephin  6/12 -Continue aspirin  81 mg daily -PT recommending SNF -Dysphagia 1 diet per SLP   2. ERSD on home HD 4x/week - HD per nephrology planned for today   3. Depression, anxiety, memory-loss  - Use delirium precautions, hold potentially sedating medications for now  - Patient now more alert and awake   4. Type II DM  - A1c was 6.2% in December 2024  - Check CBGs, use long-acting insulin  and sliding-scale correctional for now     5. Hypertension  - Permit HTN for now     6. Hypothyroidism  - Synthroid     7. CAD  - Continue ASA and Lipitor ,  hold beta-blocker for now while permitting HTN    8. Hx of CVA  - ASA, Lipitor       9. Obesity, Class 1 -BMI 32.84   DVT prophylaxis:Heparin  Code Status: Full Family Communication: None at bedside Disposition Plan:  Status is: Inpatient Remains inpatient appropriate because: Need for IV medications and placement.    Consultants:  Neurology Nephrology  Procedures:  None  Antimicrobials:  Anti-infectives (From admission, onward)    Start     Dose/Rate Route Frequency Ordered Stop   11/23/23 0800  cefTRIAXone  (ROCEPHIN ) 1 g in sodium chloride  0.9 % 100 mL IVPB        1 g 200 mL/hr over 30 Minutes Intravenous Every 24 hours 11/23/23 0703     11/21/23 1000  doxycycline  (VIBRA -TABS) tablet 100 mg  Status:  Discontinued       Note to Pharmacy: For a month for eye infection     100 mg Oral 2 times daily 11/20/23 2336 11/21/23 1036       Subjective: Patient seen and evaluated today with no new acute complaints or concerns. No acute concerns or events noted overnight.  She is planned to have further hemodialysis today and appears to have a UTI and will be started on antibiotics.  She appears more alert and awake today.  Objective: Vitals:   11/22/23 2004 11/23/23 0030 11/23/23 0319 11/23/23 0847  BP: (!) 147/50 (!) 145/44 (!) 140/45 (!) 136/46  Pulse: 89 87 82 78  Resp: 20 16 16    Temp: 98.6  F (37 C) 98 F (36.7 C) 98 F (36.7 C) 97.9 F (36.6 C)  TempSrc: Oral Oral Oral Oral  SpO2: 100% 92% 97% 96%  Weight:      Height:        Intake/Output Summary (Last 24 hours) at 11/23/2023 0954 Last data filed at 11/23/2023 0347 Gross per 24 hour  Intake 540 ml  Output 100 ml  Net 440 ml   Filed Weights   11/21/23 1253 11/21/23 1620 11/21/23 1950  Weight: 93.1 kg 93.1 kg 92.9 kg    Examination:  General exam: Appears calm and comfortable  Respiratory system: Clear to auscultation. Respiratory effort normal. Cardiovascular system: S1 & S2 heard, RRR.   Gastrointestinal system: Abdomen is soft Central nervous system: Alert and awake Extremities: No edema Skin: No significant lesions noted Psychiatry: Flat affect.    Data Reviewed: I have personally reviewed following labs and imaging studies  CBC: Recent Labs  Lab 11/20/23 2141 11/20/23 2230 11/21/23 0548 11/22/23 0348 11/23/23 0405  WBC 8.6  --  10.7* 8.5 7.9  NEUTROABS 6.5  --   --   --   --   HGB 10.2* 9.9* 10.4* 9.2* 8.8*  HCT 31.5* 29.0* 32.2* 29.8* 28.2*  MCV 97.5  --  96.1 97.4 96.2  PLT 239  --  180 218 253   Basic Metabolic Panel: Recent Labs  Lab 11/20/23 2141 11/20/23 2230 11/21/23 0548 11/22/23 0348 11/23/23 0405  NA 131* 130* 134* 134* 135  K 4.2 3.9 4.2 3.7 4.3  CL 93* 94* 98 98 97*  CO2 25  --  24 28 26   GLUCOSE 207* 202* 61* 68* 91  BUN 60* 59* 70* 46* 66*  CREATININE 5.71* 5.90* 6.10* 4.83* 5.96*  CALCIUM  8.0*  --  8.1* 8.0* 7.9*  MG  --   --   --  2.0 2.2  PHOS  --   --   --  4.9* 6.0*   GFR: Estimated Creatinine Clearance: 10.6 mL/min (A) (by C-G formula based on SCr of 5.96 mg/dL (H)). Liver Function Tests: Recent Labs  Lab 11/20/23 2141 11/22/23 0348 11/23/23 0405  AST 18  --   --   ALT 13  --   --   ALKPHOS 65  --   --   BILITOT 0.8  --   --   PROT 4.9*  --   --   ALBUMIN 2.3* 2.1* 2.2*   No results for input(s): LIPASE, AMYLASE in the last 168 hours. No results for input(s): AMMONIA in the last 168 hours. Coagulation Profile: Recent Labs  Lab 11/20/23 2141  INR 1.2   Cardiac Enzymes: No results for input(s): CKTOTAL, CKMB, CKMBINDEX, TROPONINI in the last 168 hours. BNP (last 3 results) No results for input(s): PROBNP in the last 8760 hours. HbA1C: Recent Labs    11/20/23 2141  HGBA1C 6.3*   CBG: Recent Labs  Lab 11/22/23 1642 11/22/23 1953 11/23/23 0028 11/23/23 0322 11/23/23 0723  GLUCAP 93 165* 102* 92 84   Lipid Profile: Recent Labs    11/21/23 0548  CHOL 125  HDL 67  LDLCALC 51   TRIG 36  CHOLHDL 1.9   Thyroid  Function Tests: Recent Labs    11/20/23 2359  TSH 1.353  FREET4 1.06   Anemia Panel: Recent Labs    11/20/23 2359  VITAMINB12 586   Sepsis Labs: No results for input(s): PROCALCITON, LATICACIDVEN in the last 168 hours.  Recent Results (from the past 240 hours)  Urine  Culture (for pregnant, neutropenic or urologic patients or patients with an indwelling urinary catheter)     Status: Abnormal   Collection Time: 11/20/23 10:15 PM   Specimen: In/Out Cath Urine  Result Value Ref Range Status   Specimen Description   Final    IN/OUT CATH URINE Performed at [facility], [address], [place], [place] [address]    Special Requests   Final    NONE Performed at [facility], [address] [place], [place] [address]    Culture (A)  Final    >=100,000 COLONIES/mL ESCHERICHIA COLI 40,000 COLONIES/mL PROTEUS MIRABILIS    Report Status 11/23/2023 FINAL  Final   Organism ID, Bacteria ESCHERICHIA COLI (A)  Final   Organism ID, Bacteria PROTEUS MIRABILIS (A)  Final      Susceptibility   Escherichia coli - MIC*    AMPICILLIN >=32 RESISTANT Resistant     CEFAZOLIN  <=4 SENSITIVE Sensitive     CEFEPIME <=0.12 SENSITIVE Sensitive     CEFTRIAXONE  <=0.25 SENSITIVE Sensitive     CIPROFLOXACIN <=0.25 SENSITIVE Sensitive     GENTAMICIN >=16 RESISTANT Resistant     IMIPENEM <=0.25 SENSITIVE Sensitive     NITROFURANTOIN <=16 SENSITIVE Sensitive     TRIMETH/SULFA >=320 RESISTANT Resistant     AMPICILLIN/SULBACTAM 16 INTERMEDIATE Intermediate     PIP/TAZO <=4 SENSITIVE Sensitive ug/mL    * >=100,000 COLONIES/mL ESCHERICHIA COLI   Proteus mirabilis - MIC*    AMPICILLIN <=2 SENSITIVE Sensitive     CEFAZOLIN  8 SENSITIVE Sensitive     CEFEPIME <=0.12 SENSITIVE Sensitive     CEFTRIAXONE  <=0.25 SENSITIVE Sensitive     CIPROFLOXACIN <=0.25 SENSITIVE Sensitive     GENTAMICIN <=1 SENSITIVE Sensitive     IMIPENEM 2 SENSITIVE Sensitive     NITROFURANTOIN 128  RESISTANT Resistant     TRIMETH/SULFA <=20 SENSITIVE Sensitive     AMPICILLIN/SULBACTAM <=2 SENSITIVE Sensitive     PIP/TAZO <=4 SENSITIVE Sensitive ug/mL    * 40,000 COLONIES/mL PROTEUS MIRABILIS         Radiology Studies: EEG adult Result Date: 11/21/2023 Arleene Lack, MD     11/21/2023  5:33 PM Patient Name: ETHIE CURLESS MRN: [id number] Epilepsy Attending: Arleene Lack Referring Physician/Provider: Arleene Lack, MD Date: 11/21/2023 Duration: 23.40 mins Patient history: 68 year old female presented with altered mental status and left-sided weakness. EEG to evaluate for seizure Level of alertness: Awake, asleep AEDs during EEG study: None Technical aspects: This EEG study was done with scalp electrodes positioned according to the 10-20 International system of electrode placement. Electrical activity was reviewed with band pass filter of 1-70Hz , sensitivity of 7 uV/mm, display speed of 65mm/sec with a 60Hz  notched filter applied as appropriate. EEG data were recorded continuously and digitally stored.  Video monitoring was available and reviewed as appropriate. Description: The posterior dominant rhythm consists of 6 Hz activity of moderate voltage (25-35 uV) seen predominantly in posterior head regions, symmetric and reactive to eye opening and eye closing. Sleep was characterized by vertex waves, sleep spindles (12 to 14 Hz), maximal frontocentral region. EEG showed continuous generalized predominantly 5  to 6 Hz theta-delta slowing admixed with intermittent 2-3hz  delta slowing.  Physiologic photic driving was not seen during photic stimulation.  Hyperventilation was not performed.   ABNORMALITY - Continuous slow, generalized IMPRESSION: This study is suggestive of moderate diffuse encephalopathy. No seizures or epileptiform discharges were seen throughout the recording. Arleene Lack   ECHOCARDIOGRAM COMPLETE Result Date: 11/21/2023  ECHOCARDIOGRAM REPORT   Patient Name:   EMERLYN MEHLHOFF Date of Exam: 11/21/2023 Medical Rec #:  [id number]     Height:       67.0 in Accession #:    [id number]    Weight:       209.7 lb Date of Birth:  May 17, 1956    BSA:          2.063 m Patient Age:    67 years      BP:           116/58 mmHg Patient Gender: F             HR:           77 bpm. Exam Location:  Inpatient Procedure: 2D Echo, Cardiac Doppler and Color Doppler (Both Spectral and Color            Flow Doppler were utilized during procedure). Indications:    Stroke I63.9  History:        Patient has prior history of Echocardiogram examinations, most                 recent 06/16/2023.  Sonographer:    Hersey Lorenzo RDCS Referring Phys: [phone] TIMOTHY S OPYD IMPRESSIONS  1. Left ventricular ejection fraction, by estimation, is 65 to 70%. The left ventricle has normal function. The left ventricle has no regional wall motion abnormalities. Left ventricular diastolic parameters are indeterminate.  2. Right ventricular systolic function is normal. The right ventricular size is normal. Tricuspid regurgitation signal is inadequate for assessing PA pressure.  3. The mitral valve is grossly normal. Trivial mitral valve regurgitation.  4. The aortic valve is tricuspid. There is mild calcification of the aortic valve. Aortic valve regurgitation is not visualized. Aortic valve sclerosis/calcification is present, without any evidence of aortic stenosis.  5. The inferior vena cava is normal in size with greater than 50% respiratory variability, suggesting right atrial pressure of 3 mmHg. Comparison(s): Prior images reviewed side by side. LVEF 65-70% range. Mildly calcified aortic valve without stenosis. Interatrial septum bows to the right, no obvious shunting. FINDINGS  Left Ventricle: Left ventricular ejection fraction, by estimation, is 65 to 70%. The left ventricle has normal function. The left ventricle has no regional wall motion abnormalities. The left ventricular internal cavity size was normal in size. There is   borderline concentric left ventricular hypertrophy. Left ventricular diastolic parameters are indeterminate. Right Ventricle: The right ventricular size is normal. No increase in right ventricular wall thickness. Right ventricular systolic function is normal. Tricuspid regurgitation signal is inadequate for assessing PA pressure. Left Atrium: Left atrial size was normal in size. Right Atrium: Right atrial size was normal in size. Pericardium: There is no evidence of pericardial effusion. Mitral Valve: The mitral valve is grossly normal. Trivial mitral valve regurgitation. Tricuspid Valve: The tricuspid valve is grossly normal. Tricuspid valve regurgitation is trivial. Aortic Valve: The aortic valve is tricuspid. There is mild calcification of the aortic valve. There is mild aortic valve annular calcification. Aortic valve regurgitation is not visualized. Aortic valve sclerosis/calcification is present, without any evidence of aortic stenosis. Pulmonic Valve: The pulmonic valve was grossly normal. Pulmonic valve regurgitation is trivial. Aorta: The aortic root and ascending aorta are structurally normal, with no evidence of dilitation. Venous: The inferior vena cava is normal in size with greater than 50% respiratory variability, suggesting right atrial pressure of 3 mmHg. IAS/Shunts: There is right bowing of the interatrial septum, suggestive of elevated left atrial  pressure. No atrial level shunt detected by color flow Doppler. Additional Comments: 3D was performed not requiring image post processing on an independent workstation and was indeterminate.  LEFT VENTRICLE PLAX 2D LVIDd:         5.10 cm   Diastology LVIDs:         3.30 cm   LV e' medial:    8.59 cm/s LV PW:         1.10 cm   LV E/e' medial:  9.8 LV IVS:        1.00 cm   LV e' lateral:   9.79 cm/s LVOT diam:     1.90 cm   LV E/e' lateral: 8.6 LV SV:         60 LV SV Index:   29 LVOT Area:     2.84 cm  RIGHT VENTRICLE         IVC TAPSE (M-mode): 1.7 cm   IVC diam: 1.40 cm LEFT ATRIUM             Index        RIGHT ATRIUM           Index LA diam:        3.30 cm 1.60 cm/m   RA Area:     13.70 cm LA Vol (A2C):   33.8 ml 16.38 ml/m  RA Volume:   33.40 ml  16.19 ml/m LA Vol (A4C):   53.4 ml 25.88 ml/m LA Biplane Vol: 47.1 ml 22.83 ml/m  AORTIC VALVE LVOT Vmax:   112.00 cm/s LVOT Vmean:  67.700 cm/s LVOT VTI:    0.211 m  AORTA Ao Root diam: 3.20 cm Ao Asc diam:  3.70 cm MITRAL VALVE MV Area (PHT): 3.00 cm     SHUNTS MV Decel Time: 253 msec     Systemic VTI:  0.21 m MV E velocity: 84.00 cm/s   Systemic Diam: 1.90 cm MV A velocity: 119.00 cm/s MV E/A ratio:  0.71 Teddie Favre MD Electronically signed by Teddie Favre MD Signature Date/Time: 11/21/2023/3:04:47 PM    Final    MR ANGIO HEAD WO CONTRAST Result Date: 11/21/2023 CLINICAL DATA:  68 year old female neurologic deficit. Code stroke presentation yesterday. 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 EXAM: MRA HEAD WITHOUT CONTRAST TECHNIQUE: Angiographic images of the Circle of Willis were acquired using MRA technique without intravenous contrast. COMPARISON:  Brain MRI today.  CTA head and neck yesterday. FINDINGS: Anterior circulation: Antegrade flow in both ICA siphons. Patent carotid termini. Bilateral siphon irregularity but no significant siphon stenosis. MCA and ACA origins appear normal. Anterior communicating artery and median artery of the corpus callosum are present. Left ACA appears dominant throughout. Severe stenosis of the proximal right ACA A2 on series 1033, image 13. Bilateral MCA M1 segments and bifurcations are patent. Multifocal moderate and severe bilateral MCA branch irregularity and stenosis as seen on series 1033, images 4 and 14. Posterior circulation: Patent distal vertebral arteries, vertebrobasilar junction, basilar artery without stenosis. Patent PCA origins. Posterior communicating arteries are diminutive or absent. Moderate to severe stenosis left PCA P2 segment  (series 1021, image 4). Mild contralateral right P1 and P2 segment stenosis. Evidence of moderate and severe PCA P3 segment stenosis, including on the right side series 1021, image 17. Anatomic variants: Median artery of the corpus callosum. Other: Brain MRI today reported separately. IMPRESSION: 1. Negative for large vessel occlusion. 2. Positive for advanced intracranial atherosclerosis. No significant 1st order vessel stenosis, but moderate to  severe stenosis in anterior and posterior circulation 2nd order and distal branches. 3. MRI today reported separately. Electronically Signed   By: Marlise Simpers M.D.   On: 11/21/2023 12:11   MR BRAIN WO CONTRAST Result Date: 11/21/2023 CLINICAL DATA:  68 year old female neurologic deficit. Code stroke presentation yesterday. 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 EXAM: MRI HEAD WITHOUT CONTRAST TECHNIQUE: Multiplanar, multiecho pulse sequences of the brain and surrounding structures were obtained without intravenous contrast. COMPARISON:  CT head and CTA head and neck yesterday. Brain MRI 05/21/2023. FINDINGS: Brain: No restricted diffusion to suggest acute infarction. No midline shift, mass effect, evidence of mass lesion, ventriculomegaly, extra-axial collection or acute intracranial hemorrhage. Cervicomedullary junction and pituitary are within normal limits. Numerous chronic microhemorrhages, concentrated in the brainstem, periventricular white matter, right mesial temporal lobe as before. Superimposed advanced, confluent bilateral cerebral white matter T2 and FLAIR hyperintensity. Chronic lacunar infarcts in the bilateral deep white matter capsules, bilateral deep gray nuclei, brainstem, bilateral cerebellum. Regressed mild brainstem edema seen in December. No new signal abnormality identified. Vascular: Major intracranial vascular flow voids are stable since last year. MRA today is reported separately. Skull and upper cervical spine: Stable and negative.  Sinuses/Orbits: Stable and negative. Other: Stable trace mastoid effusions. Negative visible scalp and face. IMPRESSION: 1. No acute intracranial abnormality. 2. Expected evolution of small brainstem hemorrhage since December. And otherwise stable severe underlying chronic small vessel disease. 3. MRA reported separately. Electronically Signed   By: Marlise Simpers M.D.   On: 11/21/2023 12:07        Scheduled Meds:   stroke: early stages of recovery book   Does not apply Once   aspirin   81 mg Oral Daily   atorvastatin   80 mg Oral q1800   Chlorhexidine  Gluconate Cloth  6 each Topical Q0600   heparin   5,000 Units Subcutaneous Q8H   insulin  aspart  0-6 Units Subcutaneous Q4H   levothyroxine   175 mcg Oral Q0600   sevelamer carbonate  0.8 g Oral TID WC   tobramycin  1 drop Right Eye Q2H     LOS: 2 days    Time spent: 55 minutes    Stelios Kirby Loran Rock, DO [facility]  If 7PM-7AM, please contact night-coverage [email or web address] 11/23/2023, 9:54 AM

## 2023-11-23 NOTE — Progress Notes (Signed)
 Patient ID: Jennifer [name], female   DOB: 1955/09/12, 68 y.o.   MRN: [id number] S: No new complaints O:BP (!) 136/46 (BP Location: Left Arm)   Pulse 78   Temp 97.9 F (36.6 C) (Oral)   Resp 16   Ht 5' 7 (1.702 m)   Wt 92.9 kg   SpO2 96%   BMI 32.08 kg/m   Intake/Output Summary (Last 24 hours) at 11/23/2023 1045 Last data filed at 11/23/2023 0347 Gross per 24 hour  Intake 240 ml  Output 100 ml  Net 140 ml   Intake/Output: I/O last 3 completed shifts: In: 540 [P.O.:540] Out: 600 [Urine:100; Other:500]  Intake/Output this shift:  No intake/output data recorded. Weight change:  Gen: NAD CVS: RRR Resp:CTA Abd: +BS,soft, NT/ND Ext: no edema, hematoma on right shin, RUE AVF +T/B  Recent Labs  Lab 11/20/23 2141 11/20/23 2230 11/21/23 0548 11/22/23 0348 11/23/23 0405  NA 131* 130* 134* 134* 135  K 4.2 3.9 4.2 3.7 4.3  CL 93* 94* 98 98 97*  CO2 25  --  24 28 26   GLUCOSE 207* 202* 61* 68* 91  BUN 60* 59* 70* 46* 66*  CREATININE 5.71* 5.90* 6.10* 4.83* 5.96*  ALBUMIN 2.3*  --   --  2.1* 2.2*  CALCIUM  8.0*  --  8.1* 8.0* 7.9*  PHOS  --   --   --  4.9* 6.0*  AST 18  --   --   --   --   ALT 13  --   --   --   --    Liver Function Tests: Recent Labs  Lab 11/20/23 2141 11/22/23 0348 11/23/23 0405  AST 18  --   --   ALT 13  --   --   ALKPHOS 65  --   --   BILITOT 0.8  --   --   PROT 4.9*  --   --   ALBUMIN 2.3* 2.1* 2.2*   No results for input(s): LIPASE, AMYLASE in the last 168 hours. No results for input(s): AMMONIA in the last 168 hours. CBC: Recent Labs  Lab 11/20/23 2141 11/20/23 2230 11/21/23 0548 11/22/23 0348 11/23/23 0405  WBC 8.6  --  10.7* 8.5 7.9  NEUTROABS 6.5  --   --   --   --   HGB 10.2*   < > 10.4* 9.2* 8.8*  HCT 31.5*   < > 32.2* 29.8* 28.2*  MCV 97.5  --  96.1 97.4 96.2  PLT 239  --  180 218 253   < > = values in this interval not displayed.   Cardiac Enzymes: No results for input(s): CKTOTAL, CKMB, CKMBINDEX, TROPONINI  in the last 168 hours. CBG: Recent Labs  Lab 11/22/23 1642 11/22/23 1953 11/23/23 0028 11/23/23 0322 11/23/23 0723  GLUCAP 93 165* 102* 92 84    Iron Studies: No results for input(s): IRON, TIBC, TRANSFERRIN, FERRITIN in the last 72 hours. Studies/Results: EEG adult Result Date: 11/21/2023 Arleene Lack, MD     11/21/2023  5:33 PM Patient Name: Jennifer [name] MRN: [id number] Epilepsy Attending: Arleene Lack Referring Physician/Provider: Arleene Lack, MD Date: 11/21/2023 Duration: 23.40 mins Patient history: 68 year old female presented with altered mental status and left-sided weakness. EEG to evaluate for seizure Level of alertness: Awake, asleep AEDs during EEG study: None Technical aspects: This EEG study was done with scalp electrodes positioned according to the 10-20 International system of electrode placement. Electrical activity was reviewed with band pass filter  of 1-70Hz , sensitivity of 7 uV/mm, display speed of 31mm/sec with a 60Hz  notched filter applied as appropriate. EEG data were recorded continuously and digitally stored.  Video monitoring was available and reviewed as appropriate. Description: The posterior dominant rhythm consists of 6 Hz activity of moderate voltage (25-35 uV) seen predominantly in posterior head regions, symmetric and reactive to eye opening and eye closing. Sleep was characterized by vertex waves, sleep spindles (12 to 14 Hz), maximal frontocentral region. EEG showed continuous generalized predominantly 5  to 6 Hz theta-delta slowing admixed with intermittent 2-3hz  delta slowing.  Physiologic photic driving was not seen during photic stimulation.  Hyperventilation was not performed.   ABNORMALITY - Continuous slow, generalized IMPRESSION: This study is suggestive of moderate diffuse encephalopathy. No seizures or epileptiform discharges were seen throughout the recording. Arleene Lack   ECHOCARDIOGRAM COMPLETE Result Date: 11/21/2023     ECHOCARDIOGRAM REPORT   Patient Name:   Jennifer [name] Date of Exam: 11/21/2023 Medical Rec #:  [id number]     Height:       67.0 in Accession #:    [id number]    Weight:       209.7 lb Date of Birth:  1955/12/01    BSA:          2.063 m Patient Age:    67 years      BP:           116/58 mmHg Patient Gender: F             HR:           77 bpm. Exam Location:  Inpatient Procedure: 2D Echo, Cardiac Doppler and Color Doppler (Both Spectral and Color            Flow Doppler were utilized during procedure). Indications:    Stroke I63.9  History:        Patient has prior history of Echocardiogram examinations, most                 recent 06/16/2023.  Sonographer:    Hersey Lorenzo RDCS Referring Phys: [phone] TIMOTHY S OPYD IMPRESSIONS  1. Left ventricular ejection fraction, by estimation, is 65 to 70%. The left ventricle has normal function. The left ventricle has no regional wall motion abnormalities. Left ventricular diastolic parameters are indeterminate.  2. Right ventricular systolic function is normal. The right ventricular size is normal. Tricuspid regurgitation signal is inadequate for assessing PA pressure.  3. The mitral valve is grossly normal. Trivial mitral valve regurgitation.  4. The aortic valve is tricuspid. There is mild calcification of the aortic valve. Aortic valve regurgitation is not visualized. Aortic valve sclerosis/calcification is present, without any evidence of aortic stenosis.  5. The inferior vena cava is normal in size with greater than 50% respiratory variability, suggesting right atrial pressure of 3 mmHg. Comparison(s): Prior images reviewed side by side. LVEF 65-70% range. Mildly calcified aortic valve without stenosis. Interatrial septum bows to the right, no obvious shunting. FINDINGS  Left Ventricle: Left ventricular ejection fraction, by estimation, is 65 to 70%. The left ventricle has normal function. The left ventricle has no regional wall motion abnormalities. The left ventricular  internal cavity size was normal in size. There is  borderline concentric left ventricular hypertrophy. Left ventricular diastolic parameters are indeterminate. Right Ventricle: The right ventricular size is normal. No increase in right ventricular wall thickness. Right ventricular systolic function is normal. Tricuspid regurgitation signal is inadequate for assessing PA pressure. Left Atrium:  Left atrial size was normal in size. Right Atrium: Right atrial size was normal in size. Pericardium: There is no evidence of pericardial effusion. Mitral Valve: The mitral valve is grossly normal. Trivial mitral valve regurgitation. Tricuspid Valve: The tricuspid valve is grossly normal. Tricuspid valve regurgitation is trivial. Aortic Valve: The aortic valve is tricuspid. There is mild calcification of the aortic valve. There is mild aortic valve annular calcification. Aortic valve regurgitation is not visualized. Aortic valve sclerosis/calcification is present, without any evidence of aortic stenosis. Pulmonic Valve: The pulmonic valve was grossly normal. Pulmonic valve regurgitation is trivial. Aorta: The aortic root and ascending aorta are structurally normal, with no evidence of dilitation. Venous: The inferior vena cava is normal in size with greater than 50% respiratory variability, suggesting right atrial pressure of 3 mmHg. IAS/Shunts: There is right bowing of the interatrial septum, suggestive of elevated left atrial pressure. No atrial level shunt detected by color flow Doppler. Additional Comments: 3D was performed not requiring image post processing on an independent workstation and was indeterminate.  LEFT VENTRICLE PLAX 2D LVIDd:         5.10 cm   Diastology LVIDs:         3.30 cm   LV e' medial:    8.59 cm/s LV PW:         1.10 cm   LV E/e' medial:  9.8 LV IVS:        1.00 cm   LV e' lateral:   9.79 cm/s LVOT diam:     1.90 cm   LV E/e' lateral: 8.6 LV SV:         60 LV SV Index:   29 LVOT Area:     2.84 cm   RIGHT VENTRICLE         IVC TAPSE (M-mode): 1.7 cm  IVC diam: 1.40 cm LEFT ATRIUM             Index        RIGHT ATRIUM           Index LA diam:        3.30 cm 1.60 cm/m   RA Area:     13.70 cm LA Vol (A2C):   33.8 ml 16.38 ml/m  RA Volume:   33.40 ml  16.19 ml/m LA Vol (A4C):   53.4 ml 25.88 ml/m LA Biplane Vol: 47.1 ml 22.83 ml/m  AORTIC VALVE LVOT Vmax:   112.00 cm/s LVOT Vmean:  67.700 cm/s LVOT VTI:    0.211 m  AORTA Ao Root diam: 3.20 cm Ao Asc diam:  3.70 cm MITRAL VALVE MV Area (PHT): 3.00 cm     SHUNTS MV Decel Time: 253 msec     Systemic VTI:  0.21 m MV E velocity: 84.00 cm/s   Systemic Diam: 1.90 cm MV A velocity: 119.00 cm/s MV E/A ratio:  0.71 Teddie Favre MD Electronically signed by Teddie Favre MD Signature Date/Time: 11/21/2023/3:04:47 PM    Final    MR ANGIO HEAD WO CONTRAST Result Date: 11/21/2023 CLINICAL DATA:  68 year old female neurologic deficit. Code stroke presentation yesterday. 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 EXAM: MRA HEAD WITHOUT CONTRAST TECHNIQUE: Angiographic images of the Circle of Willis were acquired using MRA technique without intravenous contrast. COMPARISON:  Brain MRI today.  CTA head and neck yesterday. FINDINGS: Anterior circulation: Antegrade flow in both ICA siphons. Patent carotid termini. Bilateral siphon irregularity but no significant siphon stenosis. MCA and ACA origins appear normal. Anterior communicating  artery and median artery of the corpus callosum are present. Left ACA appears dominant throughout. Severe stenosis of the proximal right ACA A2 on series 1033, image 13. Bilateral MCA M1 segments and bifurcations are patent. Multifocal moderate and severe bilateral MCA branch irregularity and stenosis as seen on series 1033, images 4 and 14. Posterior circulation: Patent distal vertebral arteries, vertebrobasilar junction, basilar artery without stenosis. Patent PCA origins. Posterior communicating arteries are diminutive or  absent. Moderate to severe stenosis left PCA P2 segment (series 1021, image 4). Mild contralateral right P1 and P2 segment stenosis. Evidence of moderate and severe PCA P3 segment stenosis, including on the right side series 1021, image 17. Anatomic variants: Median artery of the corpus callosum. Other: Brain MRI today reported separately. IMPRESSION: 1. Negative for large vessel occlusion. 2. Positive for advanced intracranial atherosclerosis. No significant 1st order vessel stenosis, but moderate to severe stenosis in anterior and posterior circulation 2nd order and distal branches. 3. MRI today reported separately. Electronically Signed   By: Marlise Simpers M.D.   On: 11/21/2023 12:11   MR BRAIN WO CONTRAST Result Date: 11/21/2023 CLINICAL DATA:  68 year old female neurologic deficit. Code stroke presentation yesterday. 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 EXAM: MRI HEAD WITHOUT CONTRAST TECHNIQUE: Multiplanar, multiecho pulse sequences of the brain and surrounding structures were obtained without intravenous contrast. COMPARISON:  CT head and CTA head and neck yesterday. Brain MRI 05/21/2023. FINDINGS: Brain: No restricted diffusion to suggest acute infarction. No midline shift, mass effect, evidence of mass lesion, ventriculomegaly, extra-axial collection or acute intracranial hemorrhage. Cervicomedullary junction and pituitary are within normal limits. Numerous chronic microhemorrhages, concentrated in the brainstem, periventricular white matter, right mesial temporal lobe as before. Superimposed advanced, confluent bilateral cerebral white matter T2 and FLAIR hyperintensity. Chronic lacunar infarcts in the bilateral deep white matter capsules, bilateral deep gray nuclei, brainstem, bilateral cerebellum. Regressed mild brainstem edema seen in December. No new signal abnormality identified. Vascular: Major intracranial vascular flow voids are stable since last year. MRA today is reported separately.  Skull and upper cervical spine: Stable and negative. Sinuses/Orbits: Stable and negative. Other: Stable trace mastoid effusions. Negative visible scalp and face. IMPRESSION: 1. No acute intracranial abnormality. 2. Expected evolution of small brainstem hemorrhage since December. And otherwise stable severe underlying chronic small vessel disease. 3. MRA reported separately. Electronically Signed   By: Marlise Simpers M.D.   On: 11/21/2023 12:07     stroke: early stages of recovery book   Does not apply Once   aspirin   81 mg Oral Daily   atorvastatin   80 mg Oral q1800   Chlorhexidine  Gluconate Cloth  6 each Topical Q0600   heparin   5,000 Units Subcutaneous Q8H   insulin  aspart  0-6 Units Subcutaneous Q4H   levothyroxine   175 mcg Oral Q0600   sevelamer carbonate  0.8 g Oral TID WC   tobramycin  1 drop Right Eye Q2H    BMET    Component Value Date/Time   NA 135 11/23/2023 0405   K 4.3 11/23/2023 0405   CL 97 (L) 11/23/2023 0405   CO2 26 11/23/2023 0405   GLUCOSE 91 11/23/2023 0405   BUN 66 (H) 11/23/2023 0405   CREATININE 5.96 (H) 11/23/2023 0405   CALCIUM  7.9 (L) 11/23/2023 0405   GFRNONAA 7 (L) 11/23/2023 0405   GFRAA 43 (L) 02/05/2020 2019   CBC    Component Value Date/Time   WBC 7.9 11/23/2023 0405   RBC 2.93 (L) 11/23/2023 0405  HGB 8.8 (L) 11/23/2023 0405   HGB 11.6 05/20/2020 1453   HCT 28.2 (L) 11/23/2023 0405   HCT 35.8 05/20/2020 1453   PLT 253 11/23/2023 0405   PLT 276 05/20/2020 1453   MCV 96.2 11/23/2023 0405   MCV 84 05/20/2020 1453   MCH 30.0 11/23/2023 0405   MCHC 31.2 11/23/2023 0405   RDW 15.7 (H) 11/23/2023 0405   RDW 13.5 05/20/2020 1453   LYMPHSABS 1.0 11/20/2023 2141   LYMPHSABS 1.7 05/20/2020 1453   MONOABS 0.8 11/20/2023 2141   EOSABS 0.0 11/20/2023 2141   EOSABS 0.1 05/20/2020 1453   BASOSABS 0.1 11/20/2023 2141   BASOSABS 0.1 05/20/2020 1453    Dialysis orders: [facility] rt [facility] 2hr45EDW 90.5kg Mircera 100q2 (last given 6/5)   Assessment/Plan:   Left sided weakness with slurred speech and AMS - Neuro following and given asa, permissive HTN.  MRI negative for acute findings.  Nothing acute on CT of head  ESRD -  normally has HD Sat/Sun/Tues/Wed at home; shorten treatment on 6/10 because of issues with a buttonhole was able to tolerate 2.5 hours within the UF of 500 mL.  Patient appears to be very comfortable and is not short of breath.  Will plan dialysis today and Saturday.    Hypertension/volume  - UF gently given AMS and possible CVA.  Anemia  - stable and recently received mircera; Hb 9.2 this AM  Metabolic bone disease -   continue with home meds; phos 4.9  Nutrition -  renal diet, carb modified when cleared for po. DM - per primary  Disposition - if patient is to go to a SNF she will need outpatient dialysis to be arranged as she has been doing home HD. Will need SW/CM assistance  Benjamin Brands, MD [facility]

## 2023-11-23 NOTE — Progress Notes (Signed)
 Physical Therapy Treatment Patient Details Name: Jennifer [name] MRN: [id number] DOB: 1956/02/22 Today's Date: 11/23/2023   History of Present Illness Jennifer [name] is a 68 y.o. female with medical history significant for hypertension, type 2 diabetes mellitus, hypothyroidism, ESRD on hemodialysis, CAD, depression, anxiety, memory loss, and history of CVA with residual deficits who presents with acute onset of left-sided weakness and speech difficulty.     Patient fell today and when her daughter was trying to assist her back up, she noted that the patient had new left-sided weakness, confusion, and difficulty with her speech.  She had been in her usual state at approximately 5 PM.    PT Comments  Patient presents alert and able to follow directions with occasional repeated verbal, tactile cueing.  Patient able to stand from bedside with bed slightly elevated, tolerated sit to stands x 4 trials while being cleaned due to incontinent of stool, stood for up to 3-4 minutes before having to sit due to fatigue.  Patient limited to a few shuffling side steps during transfer to chair and tolerated sitting up after therapy - nursing staff aware. Patient will benefit from continued skilled physical therapy in hospital and recommended venue below to increase strength, balance, endurance for safe ADLs and gait.    If plan is discharge home, recommend the following: A lot of help with bathing/dressing/bathroom;A lot of help with walking and/or transfers;Help with stairs or ramp for entrance;Assistance with cooking/housework   Can travel by private vehicle     No  Equipment Recommendations  None recommended by PT    Recommendations for Other Services       Precautions / Restrictions Precautions Precautions: Fall Recall of Precautions/Restrictions: Impaired Restrictions Weight Bearing Restrictions Per Provider Order: No     Mobility  Bed Mobility Overal bed mobility: Needs Assistance Bed Mobility:  Supine to Sit, Sit to Supine     Supine to sit: Mod assist, Max assist Sit to supine: Min assist   General bed mobility comments: has diffiuclty propping up on elbows to hands due to weakness    Transfers Overall transfer level: Needs assistance Equipment used: Rolling walker (2 wheels) Transfers: Sit to/from Stand, Bed to chair/wheelchair/BSC Sit to Stand: Mod assist   Step pivot transfers: Mod assist, Max assist, From elevated surface       General transfer comment: unsteady labored movement requiring bed raised during sit to stands from bedside, once fatigue had difficulty standing up from chair requiring repeated attempts    Ambulation/Gait Ambulation/Gait assistance: Max assist Gait Distance (Feet): 4 Feet Assistive device: Rolling walker (2 wheels) Gait Pattern/deviations: Decreased step length - right, Decreased step length - left, Decreased stride length, Shuffle Gait velocity: slow     General Gait Details: slow labored movement with poor tolerance for moving LLE due to weakness and increasing right knee pain, had to shuffle left foot   Stairs             Wheelchair Mobility     Tilt Bed    Modified Rankin (Stroke Patients Only)       Balance Overall balance assessment: Needs assistance Sitting-balance support: Feet supported, No upper extremity supported Sitting balance-Leahy Scale: Fair Sitting balance - Comments: fair/good seated at EOB   Standing balance support: Reliant on assistive device for balance, During functional activity, Bilateral upper extremity supported Standing balance-Leahy Scale: Poor Standing balance comment: using RW  Communication Communication Communication: No apparent difficulties  Cognition Arousal: Alert Behavior During Therapy: WFL for tasks assessed/performed   PT - Cognitive impairments: No apparent impairments                         Following commands:  Impaired Following commands impaired: Follows one step commands with increased time    Cueing Cueing Techniques: Verbal cues, Tactile cues  Exercises General Exercises - Lower Extremity Heel Raises: Seated, AROM, Strengthening, Both, 10 reps    General Comments        Pertinent Vitals/Pain Pain Assessment Faces Pain Scale: Hurts little more Pain Location: pressure to BLE mostly right knee Pain Descriptors / Indicators: Discomfort, Grimacing, Sore Pain Intervention(s): Limited activity within patient's tolerance, Monitored during session, Repositioned    Home Living                          Prior Function            PT Goals (current goals can now be found in the care plan section) Acute Rehab PT Goals Patient Stated Goal: return home PT Goal Formulation: With patient Time For Goal Achievement: 12/05/23 Potential to Achieve Goals: Good Progress towards PT goals: Progressing toward goals    Frequency    Min 3X/week      PT Plan      Co-evaluation              AM-PAC PT 6 Clicks Mobility   Outcome Measure  Help needed turning from your back to your side while in a flat bed without using bedrails?: A Lot Help needed moving from lying on your back to sitting on the side of a flat bed without using bedrails?: A Lot Help needed moving to and from a bed to a chair (including a wheelchair)?: A Lot Help needed standing up from a chair using your arms (e.g., wheelchair or bedside chair)?: A Lot Help needed to walk in hospital room?: A Lot Help needed climbing 3-5 steps with a railing? : Total 6 Click Score: 11    End of Session   Activity Tolerance: Patient tolerated treatment well;Patient limited by fatigue Patient left: in chair;with call bell/phone within reach Nurse Communication: Mobility status PT Visit Diagnosis: Unsteadiness on feet (R26.81);Other abnormalities of gait and mobility (R26.89);Muscle weakness (generalized) (M62.81)      Time: 1610-9604 PT Time Calculation (min) (ACUTE ONLY): 33 min  Charges:    $Therapeutic Activity: 23-37 mins PT General Charges $$ ACUTE PT VISIT: 1 Visit                     2:35 PM, 11/23/23 Walton Guppy, MPT Physical Therapist with [facility] [phone] office [phone] mobile phone

## 2023-11-23 NOTE — Progress Notes (Signed)
 Patients alert with confusion, verbalized no complaints of discomfort during shift. Patients daughter called regarding patients right knee and reported that patient had been having some pain in the area and was wondering about an x-ray. MD Elyse Hand made aware. No new orders at this time.

## 2023-11-23 NOTE — Plan of Care (Signed)
  Problem: Skin Integrity: Goal: Risk for impaired skin integrity will decrease Outcome: Progressing   Problem: Activity: Goal: Risk for activity intolerance will decrease Outcome: Progressing   Problem: Coping: Goal: Level of anxiety will decrease Outcome: Progressing   Problem: Safety: Goal: Ability to remain free from injury will improve Outcome: Progressing

## 2023-11-23 NOTE — Progress Notes (Signed)
  HEMODIALYSIS TREATMENT NOTE:   3.5 hour heparin -free treatment completed in recliner.  Buttonhole AVF was cannulated with blunt needles provided by pt's daughter.  Stable venous and arterial pressures throughout session.   Goal met: 1.5 liters removed.  Treatment was interrupted (40 minutes) for use of bedside commode / bedpan.  Pt requires 2-person assist + walker to stand.  Able to stay standing for about one minute using walker.  Hgb is down-trending.  Aranesp 100 mcg ordered.  Post-HD:  11/23/23 1750  Vitals  Temp 98.1 F (36.7 C)  Temp Source Oral  BP (!) 131/54  MAP (mmHg) 76  BP Location Left Arm  BP Method Automatic  Patient Position (if appropriate) Sitting  Pulse Rate 87  Pulse Rate Source Monitor  ECG Heart Rate 86  Resp 17  Oxygen  Therapy  SpO2 98 %  O2 Device Room Air  Post Treatment  Dialyzer Clearance Lightly streaked  Hemodialysis Intake (mL) 0 mL  Liters Processed 80.2  Fluid Removed (mL) 1300 mL  Tolerated HD Treatment Yes  Post-Hemodialysis Comments Goal met  AVG/AVF Arterial Site Held (minutes) 10 minutes  AVG/AVF Venous Site Held (minutes) 10 minutes  Fistula / Graft Right Upper arm Arteriovenous fistula  Placement Date/Time: 06/28/22 0818   Placed prior to admission: No  Orientation: Right  Access Location: Upper arm  Access Type: Arteriovenous fistula  Site Condition No complications  Fistula / Graft Assessment Thrill;Bruit  Status Patent    Waunita Haff, RN AP [facility]

## 2023-11-24 DIAGNOSIS — R531 Weakness: Secondary | ICD-10-CM | POA: Diagnosis not present

## 2023-11-24 LAB — MAGNESIUM: Magnesium: 2.1 mg/dL (ref 1.7–2.4)

## 2023-11-24 LAB — RENAL FUNCTION PANEL
Albumin: 2.2 g/dL — ABNORMAL LOW (ref 3.5–5.0)
Anion gap: 11 (ref 5–15)
BUN: 36 mg/dL — ABNORMAL HIGH (ref 8–23)
CO2: 28 mmol/L (ref 22–32)
Calcium: 7.8 mg/dL — ABNORMAL LOW (ref 8.9–10.3)
Chloride: 99 mmol/L (ref 98–111)
Creatinine, Ser: 3.78 mg/dL — ABNORMAL HIGH (ref 0.44–1.00)
GFR, Estimated: 12 mL/min — ABNORMAL LOW (ref >60)
Glucose, Bld: 94 mg/dL (ref 70–99)
Phosphorus: 3.8 mg/dL (ref 2.5–4.6)
Potassium: 4 mmol/L (ref 3.5–5.1)
Sodium: 138 mmol/L (ref 135–145)

## 2023-11-24 LAB — GLUCOSE, CAPILLARY
Glucose-Capillary: 102 mg/dL — ABNORMAL HIGH (ref 70–99)
Glucose-Capillary: 119 mg/dL — ABNORMAL HIGH (ref 70–99)
Glucose-Capillary: 130 mg/dL — ABNORMAL HIGH (ref 70–99)
Glucose-Capillary: 158 mg/dL — ABNORMAL HIGH (ref 70–99)
Glucose-Capillary: 206 mg/dL — ABNORMAL HIGH (ref 70–99)
Glucose-Capillary: 99 mg/dL (ref 70–99)

## 2023-11-24 LAB — CBC
HCT: 29 % — ABNORMAL LOW (ref 36.0–46.0)
Hemoglobin: 9.1 g/dL — ABNORMAL LOW (ref 12.0–15.0)
MCH: 30.3 pg (ref 26.0–34.0)
MCHC: 31.4 g/dL (ref 30.0–36.0)
MCV: 96.7 fL (ref 80.0–100.0)
Platelets: 242 10*3/uL (ref 150–400)
RBC: 3 MIL/uL — ABNORMAL LOW (ref 3.87–5.11)
RDW: 15.8 % — ABNORMAL HIGH (ref 11.5–15.5)
WBC: 6.8 10*3/uL (ref 4.0–10.5)
nRBC: 0 % (ref 0.0–0.2)

## 2023-11-24 NOTE — Progress Notes (Signed)
 Physical Therapy Treatment Patient Details Name: Jennifer [name] MRN: [id number] DOB: 27-Mar-1956 Today's Date: 11/24/2023   History of Present Illness Jennifer [name] is a 68 y.o. female with medical history significant for hypertension, type 2 diabetes mellitus, hypothyroidism, ESRD on hemodialysis, CAD, depression, anxiety, memory loss, and history of CVA with residual deficits who presents with acute onset of left-sided weakness and speech difficulty.     Patient fell today and when her daughter was trying to assist her back up, she noted that the patient had new left-sided weakness, confusion, and difficulty with her speech.  She had been in her usual state at approximately 5 PM.    PT Comments  Patient presents alert and agreeable for therapy.  Patient demonstrates slow labored movement for sitting up at bedside with fair/good return for holding onto bed rail during supine sitting, once seated able to complete BLE exercises with verbal/tactile cueing and limited to a few shuffling side steps during transfer to chair. Patient tolerated sitting up in chair after therapy - nursing staff aware. Patient will benefit from continued skilled physical therapy in hospital and recommended venue below to increase strength, balance, endurance for safe ADLs and gait.       If plan is discharge home, recommend the following: A lot of help with bathing/dressing/bathroom;A lot of help with walking and/or transfers;Help with stairs or ramp for entrance;Assistance with cooking/housework   Can travel by private vehicle     No  Equipment Recommendations  None recommended by PT    Recommendations for Other Services       Precautions / Restrictions Precautions Precautions: Fall Recall of Precautions/Restrictions: Impaired Restrictions Weight Bearing Restrictions Per Provider Order: No     Mobility  Bed Mobility Overal bed mobility: Needs Assistance Bed Mobility: Supine to Sit, Sit to Supine      Supine to sit: Mod assist, Max assist, HOB elevated Sit to supine: Min assist   General bed mobility comments: required HOB partially raised, able to hold onto bed rail for sitting up at bedside, but difficulty moving legs due to weakness    Transfers Overall transfer level: Needs assistance Equipment used: Rolling walker (2 wheels) Transfers: Sit to/from Stand, Bed to chair/wheelchair/BSC Sit to Stand: Mod assist   Step pivot transfers: Mod assist, Max assist, From elevated surface       General transfer comment: very unsteady requiring tactile assistance for moving LLE during transfer to chair    Ambulation/Gait Ambulation/Gait assistance: Max assist Gait Distance (Feet): 4 Feet Assistive device: Rolling walker (2 wheels) Gait Pattern/deviations: Decreased step length - right, Decreased step length - left, Decreased stride length, Shuffle Gait velocity: slow     General Gait Details: limited to a few shuffling side seps requiring tactile assistance to moving LLE   Stairs             Wheelchair Mobility     Tilt Bed    Modified Rankin (Stroke Patients Only)       Balance Overall balance assessment: Needs assistance Sitting-balance support: Feet supported, No upper extremity supported Sitting balance-Leahy Scale: Fair Sitting balance - Comments: fair/good seated at EOB   Standing balance support: Reliant on assistive device for balance, During functional activity, Bilateral upper extremity supported Standing balance-Leahy Scale: Poor Standing balance comment: using RW                            Communication Communication Communication: No apparent difficulties  Factors Affecting Communication: Difficulty expressing self  Cognition Arousal: Alert Behavior During Therapy: WFL for tasks assessed/performed   PT - Cognitive impairments: No apparent impairments                         Following commands: Impaired Following  commands impaired: Follows one step commands with increased time    Cueing Cueing Techniques: Verbal cues, Tactile cues  Exercises General Exercises - Lower Extremity Long Arc Quad: Both, 10 reps, Strengthening, Seated Hip Flexion/Marching: Seated, AROM, Strengthening, Both, 10 reps Toe Raises: Seated, AROM, Strengthening, Both, 10 reps Heel Raises: Seated, AROM, Strengthening, Both, 10 reps    General Comments        Pertinent Vitals/Pain Pain Assessment Pain Assessment: Faces Faces Pain Scale: Hurts a little bit Pain Location: right lower leg Pain Descriptors / Indicators: Discomfort, Sore Pain Intervention(s): Limited activity within patient's tolerance, Monitored during session, Repositioned    Home Living                          Prior Function            PT Goals (current goals can now be found in the care plan section) Acute Rehab PT Goals Patient Stated Goal: return home PT Goal Formulation: With patient Time For Goal Achievement: 12/05/23 Potential to Achieve Goals: Good Progress towards PT goals: Progressing toward goals    Frequency    Min 3X/week      PT Plan      Co-evaluation              AM-PAC PT 6 Clicks Mobility   Outcome Measure  Help needed turning from your back to your side while in a flat bed without using bedrails?: A Lot Help needed moving from lying on your back to sitting on the side of a flat bed without using bedrails?: A Lot Help needed moving to and from a bed to a chair (including a wheelchair)?: A Lot Help needed standing up from a chair using your arms (e.g., wheelchair or bedside chair)?: A Lot Help needed to walk in hospital room?: A Lot Help needed climbing 3-5 steps with a railing? : Total 6 Click Score: 11    End of Session   Activity Tolerance: Patient tolerated treatment well;Patient limited by fatigue Patient left: in chair;with call bell/phone within reach Nurse Communication: Mobility  status PT Visit Diagnosis: Unsteadiness on feet (R26.81);Other abnormalities of gait and mobility (R26.89);Muscle weakness (generalized) (M62.81)     Time: 0981-1914 PT Time Calculation (min) (ACUTE ONLY): 21 min  Charges:    $Therapeutic Exercise: 8-22 mins $Therapeutic Activity: 8-22 mins PT General Charges $$ ACUTE PT VISIT: 1 Visit                     12:31 PM, 11/24/23 Walton Guppy, MPT Physical Therapist with [facility] [phone] office [phone] mobile phone

## 2023-11-24 NOTE — Progress Notes (Signed)
 Patient ID: Jennifer [name], female   DOB: 1955-10-30, 68 y.o.   MRN: [id number] S: No events overnight O:BP (!) 135/56 (BP Location: Left Arm)   Pulse 96   Temp 97.9 F (36.6 C)   Resp 17   Ht 5' 7 (1.702 m)   Wt 92.9 kg   SpO2 92%   BMI 32.08 kg/m   Intake/Output Summary (Last 24 hours) at 11/24/2023 1014 Last data filed at 11/24/2023 4098 Gross per 24 hour  Intake 340 ml  Output 1300 ml  Net -960 ml   Intake/Output: I/O last 3 completed shifts: In: 580 [P.O.:480; IV Piggyback:100] Out: 1400 [Urine:100; Other:1300]  Intake/Output this shift:  No intake/output data recorded. Weight change:  Gen: NAD CVS: RRR Resp:CTA Abd: +BS, soft, NT/ND Ext: no edema, RUE AVF +T/B  Recent Labs  Lab 11/20/23 2141 11/20/23 2230 11/21/23 0548 11/22/23 0348 11/23/23 0405 11/24/23 0407  NA 131* 130* 134* 134* 135 138  K 4.2 3.9 4.2 3.7 4.3 4.0  CL 93* 94* 98 98 97* 99  CO2 25  --  24 28 26 28   GLUCOSE 207* 202* 61* 68* 91 94  BUN 60* 59* 70* 46* 66* 36*  CREATININE 5.71* 5.90* 6.10* 4.83* 5.96* 3.78*  ALBUMIN 2.3*  --   --  2.1* 2.2* 2.2*  CALCIUM  8.0*  --  8.1* 8.0* 7.9* 7.8*  PHOS  --   --   --  4.9* 6.0* 3.8  AST 18  --   --   --   --   --   ALT 13  --   --   --   --   --    Liver Function Tests: Recent Labs  Lab 11/20/23 2141 11/22/23 0348 11/23/23 0405 11/24/23 0407  AST 18  --   --   --   ALT 13  --   --   --   ALKPHOS 65  --   --   --   BILITOT 0.8  --   --   --   PROT 4.9*  --   --   --   ALBUMIN 2.3* 2.1* 2.2* 2.2*   No results for input(s): LIPASE, AMYLASE in the last 168 hours. No results for input(s): AMMONIA in the last 168 hours. CBC: Recent Labs  Lab 11/20/23 2141 11/20/23 2230 11/21/23 0548 11/22/23 0348 11/23/23 0405 11/24/23 0407  WBC 8.6  --  10.7* 8.5 7.9 6.8  NEUTROABS 6.5  --   --   --   --   --   HGB 10.2*   < > 10.4* 9.2* 8.8* 9.1*  HCT 31.5*   < > 32.2* 29.8* 28.2* 29.0*  MCV 97.5  --  96.1 97.4 96.2 96.7  PLT 239  --  180  218 253 242   < > = values in this interval not displayed.   Cardiac Enzymes: No results for input(s): CKTOTAL, CKMB, CKMBINDEX, TROPONINI in the last 168 hours. CBG: Recent Labs  Lab 11/23/23 1617 11/23/23 1953 11/24/23 0014 11/24/23 0406 11/24/23 0721  GLUCAP 113* 134* 130* 99 102*    Iron Studies: No results for input(s): IRON, TIBC, TRANSFERRIN, FERRITIN in the last 72 hours. Studies/Results: No results found.   stroke: early stages of recovery book   Does not apply Once   aspirin   81 mg Oral Daily   atorvastatin   80 mg Oral q1800   Chlorhexidine  Gluconate Cloth  6 each Topical Q0600   darbepoetin (ARANESP ) injection - DIALYSIS  100 mcg Subcutaneous Q Thu-1800   heparin   5,000 Units Subcutaneous Q8H   insulin  aspart  0-6 Units Subcutaneous Q4H   levothyroxine   175 mcg Oral Q0600   sevelamer  carbonate  0.8 g Oral TID WC   tobramycin   1 drop Right Eye Q2H    BMET    Component Value Date/Time   NA 138 11/24/2023 0407   K 4.0 11/24/2023 0407   CL 99 11/24/2023 0407   CO2 28 11/24/2023 0407   GLUCOSE 94 11/24/2023 0407   BUN 36 (H) 11/24/2023 0407   CREATININE 3.78 (H) 11/24/2023 0407   CALCIUM  7.8 (L) 11/24/2023 0407   GFRNONAA 12 (L) 11/24/2023 0407   GFRAA 43 (L) 02/05/2020 2019   CBC    Component Value Date/Time   WBC 6.8 11/24/2023 0407   RBC 3.00 (L) 11/24/2023 0407   HGB 9.1 (L) 11/24/2023 0407   HGB 11.6 05/20/2020 1453   HCT 29.0 (L) 11/24/2023 0407   HCT 35.8 05/20/2020 1453   PLT 242 11/24/2023 0407   PLT 276 05/20/2020 1453   MCV 96.7 11/24/2023 0407   MCV 84 05/20/2020 1453   MCH 30.3 11/24/2023 0407   MCHC 31.4 11/24/2023 0407   RDW 15.8 (H) 11/24/2023 0407   RDW 13.5 05/20/2020 1453   LYMPHSABS 1.0 11/20/2023 2141   LYMPHSABS 1.7 05/20/2020 1453   MONOABS 0.8 11/20/2023 2141   EOSABS 0.0 11/20/2023 2141   EOSABS 0.1 05/20/2020 1453   BASOSABS 0.1 11/20/2023 2141   BASOSABS 0.1 05/20/2020 1453    Dialysis  orders: [facility] rt [facility] 2hr45EDW 90.5kg Mircera 100q2 (last given 6/5)   Assessment/Plan:  Left sided weakness with slurred speech and AMS - Neuro following and given asa, permissive HTN.  MRI negative for acute findings.  Nothing acute on CT of head  ESRD -  normally has HD Sat/Sun/Tues/Wed at home; shorten treatment on 6/10 because of issues with a buttonhole was able to tolerate 2.5 hours within the UF of 500 mL.  Patient appears to be very comfortable and is not short of breath.  Will plan dialysis on Saturday.  Has a spot MWF at [facility] to start next week.    Hypertension/volume  - UF gently given AMS and possible CVA.  Anemia  - stable and recently received mircera; Hb 9.2 this AM  Metabolic bone disease -   continue with home meds; phos 4.9  Nutrition -  renal diet, carb modified when cleared for po. DM - per primary  Disposition - if patient is to go to a SNF she will need outpatient dialysis to be arranged as she has been doing home HD. She has a spot at [facility] on Monday and will be discharged to [facility] Saturday after HD.  Benjamin Brands, MD [employer] [phone]

## 2023-11-24 NOTE — Progress Notes (Signed)
 Speech Language Pathology Treatment: Dysphagia  Patient Details Name: Jennifer [name] MRN: [id number] DOB: 1955-12-12 Today's Date: 11/24/2023 Time: 4098-1191 SLP Time Calculation (min) (ACUTE ONLY): 18 min  Assessment / Plan / Recommendation Clinical Impression  Pt seen for f/u dysphagia tx with progression of textures to mechanical soft consistency with placement on left side (dentures unavailable, but has natural dentition on left; min prolonged mastication, but no overt s/s of aspiration noted and pt voiced she consumed regular/thin liquids at home without adverse effects.  Husband confirmed information.  No oral residue noted after trial of mechanical soft (peaches,cookie; independent with use of slow rate and small bites/sips with initial reminder to consume food/liquids with general swallow precautions.  Recommend progressing diet to mechanical soft/thin liquids with general swallow precautions in place for small bites/sips and slow rate.  Medications continue whole or crushed in puree depending on size to ease transition into pharynx.  ST may be beneficial to f/u at next venue of care to assess tolerance of new consistency/fatigue factor.  ST will continue to f/u in acute setting until d/c to SNF.    HPI HPI: Pt is a 68 y.o. 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 Pt known to ST service during Dec 2024 [facility] admission, presented with oral phase dysphagia and dysarthria at that time. Previous MBSS 05/22/23; ST completed a clinical swallow evaluation with recs for Puree/thin liquids d/t pt not having dentures present.  ST f/u for diet check/swallow tx.      SLP Plan  Goals updated          Recommendations  Diet recommendations: Dysphagia 3  (mechanical soft);Thin liquid Liquids provided via: Cup;Straw Medication Administration: Crushed with puree Supervision: Patient able to self feed;Staff to assist with self feeding;Intermittent supervision to cue for compensatory strategies Compensations: Slow rate;Small sips/bites Postural Changes and/or Swallow Maneuvers: Seated upright 90 degrees                  Oral care BID   Set up Supervision/Assistance Dysphagia, unspecified (R13.10)     Goals updated     Norval Been, M.S.,CCC-SLP 11/24/2023, 12:09 PM

## 2023-11-24 NOTE — Progress Notes (Signed)
 PROGRESS NOTE    Jennifer [name]  DGU:[id number] DOB: September 17, 1955 DOA: 11/20/2023 PCP: Clemens Curt, MD   Brief Narrative:    Jennifer [name] is a 68 y.o. female with medical history significant for hypertension, type 2 diabetes mellitus, hypothyroidism, ESRD on hemodialysis, CAD, depression, anxiety, memory loss, and history of CVA with residual deficits who presents with acute onset of left-sided weakness and speech difficulty.  Patient has been admitted for evaluation of acute encephalopathy with left-sided weakness.  MRI does not show any signs of acute stroke.  EEG negative for any acute findings.  PT now recommending SNF and patient awaiting placement and receiving hemodialysis in the interim.  Noted to have growth of E. coli and Proteus in urine cultures and started on Rocephin  6/12.  Assessment & Plan:   Principal Problem:   Acute left-sided weakness Active Problems:   COPD (chronic obstructive pulmonary disease) (HCC)   Acquired hypothyroidism   Coronary artery disease involving native coronary artery of native heart without angina pectoris   History of CVA (cerebrovascular accident)   ESRD (end stage renal disease) (HCC)  Assessment and Plan:   1. Left-sided weakness and speech difficulty likely related to E. coli/Proteus UTI - Appreciate neurology evaluation with MRI negative and EEG within normal limits -Started on Rocephin  6/12 which the organisms are sensitive to day 2/3 -Continue aspirin  81 mg daily -PT recommending SNF -Dysphagia 1 diet per SLP   2. ERSD on home HD 4x/week - HD per nephrology planned for tomorrow   3. Depression, anxiety, memory-loss  - Use delirium precautions, hold potentially sedating medications for now  - Patient now more alert and awake   4. Type II DM  - A1c was 6.2% in December 2024  - Check CBGs, use long-acting insulin  and sliding-scale correctional for now     5. Hypertension  - Permit HTN for now     6. Hypothyroidism  -  Synthroid     7. CAD  - Continue ASA and Lipitor , hold beta-blocker for now while permitting HTN    8. Hx of CVA  - ASA, Lipitor       9. Obesity, Class 1 -BMI 32.84   DVT prophylaxis:Heparin  Code Status: Full Family Communication: None at bedside Disposition Plan:  Status is: Inpatient Remains inpatient appropriate because: Need for IV medications and placement.    Consultants:  Neurology Nephrology  Procedures:  None  Antimicrobials:  Anti-infectives (From admission, onward)    Start     Dose/Rate Route Frequency Ordered Stop   11/23/23 0800  cefTRIAXone  (ROCEPHIN ) 1 g in sodium chloride  0.9 % 100 mL IVPB        1 g 200 mL/hr over 30 Minutes Intravenous Every 24 hours 11/23/23 0703     11/21/23 1000  doxycycline  (VIBRA -TABS) tablet 100 mg  Status:  Discontinued       Note to Pharmacy: For a month for eye infection     100 mg Oral 2 times daily 11/20/23 2336 11/21/23 1036       Subjective: Patient seen and evaluated today with no new acute complaints or concerns. No acute concerns or events noted overnight.  Hemodialysis planned for tomorrow as well as discharge afterward to facility.  Objective: Vitals:   11/23/23 1737 11/23/23 1750 11/23/23 1954 11/24/23 0549  BP:  (!) 131/54 (!) 139/44 (!) 135/56  Pulse:  87 87 96  Resp: 16 17 18 17   Temp:  98.1 F (36.7 C) 97.7 F (36.5 C) 97.9 F (  36.6 C)  TempSrc:  Oral Oral   SpO2:  98% 94% 92%  Weight:      Height:        Intake/Output Summary (Last 24 hours) at 11/24/2023 1029 Last data filed at 11/24/2023 1610 Gross per 24 hour  Intake 340 ml  Output 1300 ml  Net -960 ml   Filed Weights   11/21/23 1253 11/21/23 1620 11/21/23 1950  Weight: 93.1 kg 93.1 kg 92.9 kg    Examination:  General exam: Appears calm and comfortable  Respiratory system: Clear to auscultation. Respiratory effort normal. Cardiovascular system: S1 & S2 heard, RRR.  Gastrointestinal system: Abdomen is soft Central nervous system:  Alert and awake Extremities: No edema Skin: No significant lesions noted Psychiatry: Flat affect.    Data Reviewed: I have personally reviewed following labs and imaging studies  CBC: Recent Labs  Lab 11/20/23 2141 11/20/23 2230 11/21/23 0548 11/22/23 0348 11/23/23 0405 11/24/23 0407  WBC 8.6  --  10.7* 8.5 7.9 6.8  NEUTROABS 6.5  --   --   --   --   --   HGB 10.2* 9.9* 10.4* 9.2* 8.8* 9.1*  HCT 31.5* 29.0* 32.2* 29.8* 28.2* 29.0*  MCV 97.5  --  96.1 97.4 96.2 96.7  PLT 239  --  180 218 253 242   Basic Metabolic Panel: Recent Labs  Lab 11/20/23 2141 11/20/23 2230 11/21/23 0548 11/22/23 0348 11/23/23 0405 11/24/23 0407  NA 131* 130* 134* 134* 135 138  K 4.2 3.9 4.2 3.7 4.3 4.0  CL 93* 94* 98 98 97* 99  CO2 25  --  24 28 26 28   GLUCOSE 207* 202* 61* 68* 91 94  BUN 60* 59* 70* 46* 66* 36*  CREATININE 5.71* 5.90* 6.10* 4.83* 5.96* 3.78*  CALCIUM  8.0*  --  8.1* 8.0* 7.9* 7.8*  MG  --   --   --  2.0 2.2 2.1  PHOS  --   --   --  4.9* 6.0* 3.8   GFR: Estimated Creatinine Clearance: 16.7 mL/min (A) (by C-G formula based on SCr of 3.78 mg/dL (H)). Liver Function Tests: Recent Labs  Lab 11/20/23 2141 11/22/23 0348 11/23/23 0405 11/24/23 0407  AST 18  --   --   --   ALT 13  --   --   --   ALKPHOS 65  --   --   --   BILITOT 0.8  --   --   --   PROT 4.9*  --   --   --   ALBUMIN 2.3* 2.1* 2.2* 2.2*   No results for input(s): LIPASE, AMYLASE in the last 168 hours. No results for input(s): AMMONIA in the last 168 hours. Coagulation Profile: Recent Labs  Lab 11/20/23 2141  INR 1.2   Cardiac Enzymes: No results for input(s): CKTOTAL, CKMB, CKMBINDEX, TROPONINI in the last 168 hours. BNP (last 3 results) No results for input(s): PROBNP in the last 8760 hours. HbA1C: No results for input(s): HGBA1C in the last 72 hours.  CBG: Recent Labs  Lab 11/23/23 1617 11/23/23 1953 11/24/23 0014 11/24/23 0406 11/24/23 0721  GLUCAP 113* 134* 130* 99  102*   Lipid Profile: No results for input(s): CHOL, HDL, LDLCALC, TRIG, CHOLHDL, LDLDIRECT in the last 72 hours.  Thyroid  Function Tests: No results for input(s): TSH, T4TOTAL, FREET4, T3FREE, THYROIDAB in the last 72 hours.  Anemia Panel: No results for input(s): VITAMINB12, FOLATE, FERRITIN, TIBC, IRON, RETICCTPCT in the last 72 hours.  Sepsis  Labs: No results for input(s): PROCALCITON, LATICACIDVEN in the last 168 hours.  Recent Results (from the past 240 hours)  Urine Culture (for pregnant, neutropenic or urologic patients or patients with an indwelling urinary catheter)     Status: Abnormal   Collection Time: 11/20/23 10:15 PM   Specimen: In/Out Cath Urine  Result Value Ref Range Status   Specimen Description   Final    IN/OUT CATH URINE Performed at [facility], [address] [place], [place] [address]    Special Requests   Final    NONE Performed at [facility], [address] [place], [place] [address]    Culture (A)  Final    >=100,000 COLONIES/mL ESCHERICHIA COLI 40,000 COLONIES/mL PROTEUS MIRABILIS    Report Status 11/23/2023 FINAL  Final   Organism ID, Bacteria ESCHERICHIA COLI (A)  Final   Organism ID, Bacteria PROTEUS MIRABILIS (A)  Final      Susceptibility   Escherichia coli - MIC*    AMPICILLIN >=32 RESISTANT Resistant     CEFAZOLIN  <=4 SENSITIVE Sensitive     CEFEPIME <=0.12 SENSITIVE Sensitive     CEFTRIAXONE  <=0.25 SENSITIVE Sensitive     CIPROFLOXACIN <=0.25 SENSITIVE Sensitive     GENTAMICIN >=16 RESISTANT Resistant     IMIPENEM <=0.25 SENSITIVE Sensitive     NITROFURANTOIN <=16 SENSITIVE Sensitive     TRIMETH/SULFA >=320 RESISTANT Resistant     AMPICILLIN/SULBACTAM 16 INTERMEDIATE Intermediate     PIP/TAZO <=4 SENSITIVE Sensitive ug/mL    * >=100,000 COLONIES/mL ESCHERICHIA COLI   Proteus mirabilis - MIC*    AMPICILLIN <=2 SENSITIVE Sensitive     CEFAZOLIN  8 SENSITIVE Sensitive     CEFEPIME <=0.12  SENSITIVE Sensitive     CEFTRIAXONE  <=0.25 SENSITIVE Sensitive     CIPROFLOXACIN <=0.25 SENSITIVE Sensitive     GENTAMICIN <=1 SENSITIVE Sensitive     IMIPENEM 2 SENSITIVE Sensitive     NITROFURANTOIN 128 RESISTANT Resistant     TRIMETH/SULFA <=20 SENSITIVE Sensitive     AMPICILLIN/SULBACTAM <=2 SENSITIVE Sensitive     PIP/TAZO <=4 SENSITIVE Sensitive ug/mL    * 40,000 COLONIES/mL PROTEUS MIRABILIS         Radiology Studies: No results found.       Scheduled Meds:   stroke: early stages of recovery book   Does not apply Once   aspirin   81 mg Oral Daily   atorvastatin   80 mg Oral q1800   Chlorhexidine  Gluconate Cloth  6 each Topical Q0600   darbepoetin (ARANESP ) injection - DIALYSIS  100 mcg Subcutaneous Q Thu-1800   heparin   5,000 Units Subcutaneous Q8H   insulin  aspart  0-6 Units Subcutaneous Q4H   levothyroxine   175 mcg Oral Q0600   sevelamer  carbonate  0.8 g Oral TID WC   tobramycin   1 drop Right Eye Q2H     LOS: 3 days    Time spent: 55 minutes    Venecia Mehl Loran Rock, DO [facility]  If 7PM-7AM, please contact night-coverage [email or web address] 11/24/2023, 10:29 AM

## 2023-11-24 NOTE — TOC Progression Note (Addendum)
 Transition of Care [facility]) - Progression Note    Patient Details  Name: ANNJANETTE WERTENBERGER MRN: [id number] Date of Birth: 1955-06-24  [facility] CM/SW Contact  Orelia Binet, RN Phone Number: 11/24/2023, 10:36 AM  Clinical Narrative:   Confirmed with Lynnie Saucier, that [facility] will admit the patient tomorrow after HD. Team updated with the plan. Cathleen Coach, daughter will go to [place] today at 1PM to sign paperwork with Tonya.  TOC CMA confirmed chair time with [facility] in [place] for Monday at [place]. TOC following.    Expected Discharge Plan: Skilled Nursing Facility Barriers to Discharge: Continued Medical Work up  Expected Discharge Plan and Services       Living arrangements for the past 2 months: Single Family Home                     Social Determinants of Health (SDOH) Interventions SDOH Screenings   Food Insecurity: No Food Insecurity (11/21/2023)  Housing: Low Risk  (11/21/2023)  Transportation Needs: No Transportation Needs (11/21/2023)  Utilities: Not At Risk (11/21/2023)  Alcohol  Screen: Low Risk  (03/07/2023)  Depression (PHQ2-9): High Risk (05/30/2023)  Financial Resource Strain: Low Risk  (03/07/2023)  Physical Activity: Inactive (03/07/2023)  Social Connections: Socially Integrated (11/21/2023)  Stress: No Stress Concern Present (03/07/2023)  Tobacco Use: Medium Risk (11/20/2023)  Health Literacy: Adequate Health Literacy (03/07/2023)    Readmission Risk Interventions     No data to display

## 2023-11-24 NOTE — Plan of Care (Signed)
  Problem: Skin Integrity: Goal: Risk for impaired skin integrity will decrease Outcome: Progressing   Problem: Nutrition: Goal: Risk of aspiration will decrease Outcome: Progressing

## 2023-11-24 NOTE — Plan of Care (Signed)
  Problem: Skin Integrity: Goal: Risk for impaired skin integrity will decrease Outcome: Progressing   Problem: Activity: Goal: Risk for activity intolerance will decrease Outcome: Progressing   Problem: Coping: Goal: Level of anxiety will decrease Outcome: Progressing   Problem: Safety: Goal: Ability to remain free from injury will improve Outcome: Progressing

## 2023-11-24 NOTE — Plan of Care (Signed)
  Problem: Skin Integrity: Goal: Risk for impaired skin integrity will decrease Outcome: Progressing   Problem: Nutrition: Goal: Risk of aspiration will decrease Outcome: Progressing   Problem: Activity: Goal: Risk for activity intolerance will decrease Outcome: Progressing   Problem: Coping: Goal: Level of anxiety will decrease Outcome: Progressing   Problem: Safety: Goal: Ability to remain free from injury will improve Outcome: Progressing

## 2023-11-24 NOTE — Care Management Important Message (Signed)
 Important Message  Patient Details  Name: Jennifer [name] MRN: [id number] Date of Birth: Nov 03, 1955   Important Message Given:  Yes - Medicare IM     [name] L Darris Staiger 11/24/2023, 10:48 AM

## 2023-11-25 DIAGNOSIS — R531 Weakness: Secondary | ICD-10-CM | POA: Diagnosis not present

## 2023-11-25 LAB — RENAL FUNCTION PANEL
Albumin: 2.1 g/dL — ABNORMAL LOW (ref 3.5–5.0)
Anion gap: 8 (ref 5–15)
BUN: 59 mg/dL — ABNORMAL HIGH (ref 8–23)
CO2: 28 mmol/L (ref 22–32)
Calcium: 7.9 mg/dL — ABNORMAL LOW (ref 8.9–10.3)
Chloride: 100 mmol/L (ref 98–111)
Creatinine, Ser: 5.2 mg/dL — ABNORMAL HIGH (ref 0.44–1.00)
GFR, Estimated: 8 mL/min — ABNORMAL LOW (ref >60)
Glucose, Bld: 131 mg/dL — ABNORMAL HIGH (ref 70–99)
Phosphorus: 4.8 mg/dL — ABNORMAL HIGH (ref 2.5–4.6)
Potassium: 4.4 mmol/L (ref 3.5–5.1)
Sodium: 136 mmol/L (ref 135–145)

## 2023-11-25 LAB — CBC
HCT: 26.5 % — ABNORMAL LOW (ref 36.0–46.0)
Hemoglobin: 8 g/dL — ABNORMAL LOW (ref 12.0–15.0)
MCH: 30 pg (ref 26.0–34.0)
MCHC: 30.2 g/dL (ref 30.0–36.0)
MCV: 99.3 fL (ref 80.0–100.0)
Platelets: 219 10*3/uL (ref 150–400)
RBC: 2.67 MIL/uL — ABNORMAL LOW (ref 3.87–5.11)
RDW: 15.7 % — ABNORMAL HIGH (ref 11.5–15.5)
WBC: 7 10*3/uL (ref 4.0–10.5)
nRBC: 0 % (ref 0.0–0.2)

## 2023-11-25 LAB — GLUCOSE, CAPILLARY
Glucose-Capillary: 111 mg/dL — ABNORMAL HIGH (ref 70–99)
Glucose-Capillary: 116 mg/dL — ABNORMAL HIGH (ref 70–99)
Glucose-Capillary: 139 mg/dL — ABNORMAL HIGH (ref 70–99)
Glucose-Capillary: 140 mg/dL — ABNORMAL HIGH (ref 70–99)
Glucose-Capillary: 141 mg/dL — ABNORMAL HIGH (ref 70–99)
Glucose-Capillary: 91 mg/dL (ref 70–99)

## 2023-11-25 LAB — HEMOGLOBIN AND HEMATOCRIT, BLOOD
HCT: 27.5 % — ABNORMAL LOW (ref 36.0–46.0)
Hemoglobin: 8.7 g/dL — ABNORMAL LOW (ref 12.0–15.0)

## 2023-11-25 LAB — MAGNESIUM: Magnesium: 2.3 mg/dL (ref 1.7–2.4)

## 2023-11-25 MED ORDER — SODIUM CHLORIDE 0.9 % IV SOLN
1.0000 g | INTRAVENOUS | Status: AC
  Administered 2023-11-25: 1 g via INTRAVENOUS
  Filled 2023-11-25: qty 10

## 2023-11-25 MED ORDER — NEPRO/CARBSTEADY PO LIQD: 237.0000 mL | ORAL | 0 refills | Status: DC | PRN

## 2023-11-25 NOTE — TOC Transition Note (Signed)
 Transition of Care [facility]) - Discharge Note   Patient Details  Name: Jennifer [name] MRN: [id number] Date of Birth: 09/01/1955  [facility] CM/SW Contact:  Orelia Binet, RN Phone Number: 11/25/2023, 11:32 AM   Clinical Narrative:     Patient completed HD, DC summary sent to [facility], room number provided, RN called report. TOC schedule EMS and updated her daughter, she is at the bedside with clothes.  Tonya at [facility] update. Patient's [facility] chair time is Monday at 11:30 at the [facility] [place], [place]  Final next level of care: Skilled Nursing Facility Barriers to Discharge: Barriers Resolved   Patient Goals and CMS Choice Patient states their goals for this hospitalization and ongoing recovery are:: agreeable to SNF CMS Medicare.gov Compare Post Acute Care list provided to:: Patient Represenative (must comment) Choice offered to / list presented to : Adult Children      Discharge Placement                Patient to be transferred to facility by: EMS Name of family member notified: Cathleen Coach Patient and family notified of of transfer: 11/25/23  Discharge Plan and Services Additional resources added to the After Visit Summary for            Social Drivers of Health (SDOH) Interventions SDOH Screenings   Food Insecurity: No Food Insecurity (11/21/2023)  Housing: Low Risk  (11/21/2023)  Transportation Needs: No Transportation Needs (11/21/2023)  Utilities: Not At Risk (11/21/2023)  Alcohol  Screen: Low Risk  (03/07/2023)  Depression (PHQ2-9): High Risk (05/30/2023)  Financial Resource Strain: Low Risk  (03/07/2023)  Physical Activity: Inactive (03/07/2023)  Social Connections: Socially Integrated (11/21/2023)  Stress: No Stress Concern Present (03/07/2023)  Tobacco Use: Medium Risk (11/20/2023)  Health Literacy: Adequate Health Literacy (03/07/2023)     Readmission Risk Interventions     No data to display

## 2023-11-25 NOTE — Progress Notes (Signed)
 Report called the Britta Candy, Charity fundraiser at [facility].

## 2023-11-25 NOTE — Progress Notes (Signed)
 Pt completed HD tx without issue. Pt goal met.  11/25/23 1141  Vitals  Temp 98.2 F (36.8 C)  Temp Source Oral  BP (!) 124/56  BP Location Left Wrist  BP Method Automatic  Patient Position (if appropriate) Lying  Pulse Rate 92  Resp 17  Oxygen  Therapy  SpO2 100 %  O2 Device Room Air  During Treatment Monitoring  HD Safety Checks Performed Yes  Intra-Hemodialysis Comments Tx completed  Post Treatment  Dialyzer Clearance Lightly streaked  Hemodialysis Intake (mL) 0 mL  Liters Processed 72  Fluid Removed (mL) 1800 mL  Tolerated HD Treatment Yes  Post-Hemodialysis Comments Pt goal met.  AVG/AVF Arterial Site Held (minutes) 10 minutes  AVG/AVF Venous Site Held (minutes) 10 minutes  Fistula / Graft Right Upper arm Arteriovenous fistula  Placement Date/Time: 06/28/22 0818   Placed prior to admission: No  Orientation: Right  Access Location: Upper arm  Access Type: Arteriovenous fistula  Site Condition No complications  Fistula / Graft Assessment Present;Thrill;Bruit  Status Deaccessed  Needle Size 15  Drainage Description None

## 2023-11-25 NOTE — Discharge Summary (Signed)
 Physician Discharge Summary  Jennifer [name] BJY:[id number] DOB: March 07, 1956 DOA: 11/20/2023  PCP: Jennifer Curt, MD  Admit date: 11/20/2023  Discharge date: 11/25/2023  Admitted From:Home  Disposition:  SNF  Recommendations for Outpatient Follow-up:  Follow up with PCP in 1-2 weeks Continue hemodialysis as will be scheduled MWF at facility with last session 6/14 Continue medications as noted below Follow-up with neurology outpatient as needed Continue aspirin  81 mg daily Patient has completed 3-day course of antibiotics with Rocephin  for UTI  Home Health: None  Equipment/Devices: None  Discharge Condition:Stable  CODE STATUS: Full  Diet recommendation: Heart Healthy/carb modified  Brief/Interim Summary: Jennifer [name] is a 68 y.o. female with medical history significant for hypertension, type 2 diabetes mellitus, hypothyroidism, ESRD on hemodialysis, CAD, depression, anxiety, memory loss, and history of CVA with residual deficits who presents with acute onset of left-sided weakness and speech difficulty.  Patient has been admitted for evaluation of acute encephalopathy with left-sided weakness.  MRI does not show any signs of acute stroke.  EEG negative for any acute findings.  PT now recommending SNF and patient awaiting placement and receiving hemodialysis in the interim.  Noted to have growth of E. coli and Proteus in urine cultures and started on Rocephin  6/12 and has now completed course of treatment.  She has had hemodialysis on 6/14 and will be transition to an MWF schedule at rehab.  No other acute events or concerns noted throughout the course of this admission.  Discharge Diagnoses:  Principal Problem:   Acute left-sided weakness Active Problems:   COPD (chronic obstructive pulmonary disease) (HCC)   Acquired hypothyroidism   Coronary artery disease involving native coronary artery of native heart without angina pectoris   History of CVA (cerebrovascular accident)   ESRD  (end stage renal disease) (HCC)  Principal discharge diagnosis: Weakness and speech difficulty likely related to E. coli/Proteus UTI with no findings of acute CVA.  Discharge Instructions  Discharge Instructions     Diet - low sodium heart healthy   Complete by: As directed    Increase activity slowly   Complete by: As directed    No wound care   Complete by: As directed       Allergies as of 11/25/2023       Reactions   Metformin Diarrhea   Tenecteplase  Other (See Comments)   Unknown         Medication List     TAKE these medications    aspirin  81 MG chewable tablet Chew 1 tablet (81 mg total) by mouth daily.   atorvastatin  80 MG tablet Commonly known as: LIPITOR  TAKE 1 TABLET BY MOUTH ONCE DAILY AT  6  IN  THE  EVENING   BD Pen Needle Nano 2nd Gen 32G X 4 MM Misc Generic drug: Insulin  Pen Needle Inject into the skin 3 (three) times daily.   buPROPion  150 MG 12 hr tablet Commonly known as: WELLBUTRIN  SR Take 1 tablet by mouth twice daily   carvedilol  12.5 MG tablet Commonly known as: COREG  Take 1 tablet (12.5 mg total) by mouth daily. On non-dialysis days What changed: when to take this   doxycycline  100 MG capsule Commonly known as: MONODOX  Take 100 mg by mouth 2 (two) times daily. For a month for eye infection   DULoxetine  60 MG capsule Commonly known as: CYMBALTA  Take 1 capsule by mouth once daily   feeding supplement (NEPRO CARB STEADY) Liqd Take 237 mLs by mouth as needed (missed meal  during dialysis.).   ferrous sulfate  325 (65 FE) MG tablet Take 325 mg by mouth as needed (given when needed at dialysis).   furosemide  80 MG tablet Commonly known as: LASIX  Take 80 mg by mouth daily. On non-dialysis days   insulin  lispro protamine-lispro (75-25) 100 UNIT/ML Susp injection Commonly known as: HUMALOG  75/25 MIX Inject 10-25 Units into the skin 3 (three) times daily as needed (high bs). Inject 25 units with breakfast, 10 units at lunch, 25 units  at dinner   iron sucrose 100 mg in sodium chloride  0.9 % 100 mL Inject 100 mg into the vein as needed (iron-deficiency anemia). Venofer   levothyroxine  175 MCG tablet Commonly known as: SYNTHROID  Take 175 mcg by mouth daily before breakfast.   lidocaine -prilocaine  cream Commonly known as: EMLA  Apply 1 Application topically 4 (four) times a week.   memantine  10 MG tablet Commonly known as: Namenda  Take 1 tablet (10 mg total) by mouth 2 (two) times daily.   Mircera 200 MCG/0.3ML Sosy Generic drug: Methoxy PEG-Epoetin Beta Inject 200 mcg as directed as needed (low RBCs). Mircera   nitroGLYCERIN  0.4 MG SL tablet Commonly known as: NITROSTAT  Place 0.4 mg under the tongue every 5 (five) minutes as needed for chest pain.   sevelamer  carbonate 0.8 g Pack packet Commonly known as: RENVELA  Take 0.8 g by mouth 3 (three) times daily with meals.   tobramycin  0.3 % ophthalmic solution Commonly known as: TOBREX  Place 1 drop into the right eye every 2 (two) hours.        Contact information for follow-up providers     [name], Jennifer Seeds, MD. Schedule an appointment as soon as possible for a visit in 1 week(s).   Specialties: Family Medicine, Radiology Contact information: [address] [place] [address] [phone]              Contact information for after-discharge care     Destination     [facility] [place], [place] .   Service: Skilled Nursing Contact information: [address] [place]  [address] [phone]                    Allergies  Allergen Reactions   Metformin Diarrhea   Tenecteplase  Other (See Comments)    Unknown     Consultations: Nephrology Neurology   Procedures/Studies: EEG adult Result Date: 11/21/2023 Jennifer Lack, MD     11/21/2023  5:33 PM Patient Name: Jennifer [name] MRN: [id number] Epilepsy Attending: Arleene [name] Referring Physician/Provider: Arleene Lack, MD Date: 11/21/2023  Duration: 23.40 mins Patient history: 68 year old female presented with altered mental status and left-sided weakness. EEG to evaluate for seizure Level of alertness: Awake, asleep AEDs during EEG study: None Technical aspects: This EEG study was done with scalp electrodes positioned according to the 10-20 International system of electrode placement. Electrical activity was reviewed with band pass filter of 1-70Hz , sensitivity of 7 uV/mm, display speed of 49mm/sec with a 60Hz  notched filter applied as appropriate. EEG data were recorded continuously and digitally stored.  Video monitoring was available and reviewed as appropriate. Description: The posterior dominant rhythm consists of 6 Hz activity of moderate voltage (25-35 uV) seen predominantly in posterior head regions, symmetric and reactive to eye opening and eye closing. Sleep was characterized by vertex waves, sleep spindles (12 to 14 Hz), maximal frontocentral region. EEG showed continuous generalized predominantly 5  to 6 Hz theta-delta slowing admixed with intermittent 2-3hz  delta slowing.  Physiologic  photic driving was not seen during photic stimulation.  Hyperventilation was not performed.   ABNORMALITY - Continuous slow, generalized IMPRESSION: This study is suggestive of moderate diffuse encephalopathy. No seizures or epileptiform discharges were seen throughout the recording. Jennifer [name]   ECHOCARDIOGRAM COMPLETE Result Date: 11/21/2023    ECHOCARDIOGRAM REPORT   Patient Name:   DEIONDRA DENLEY Date of Exam: 11/21/2023 Medical Rec #:  [id number]     Height:       67.0 in Accession #:    [id number]    Weight:       209.7 lb Date of Birth:  Mar 20, 1956    BSA:          2.063 m Patient Age:    67 years      BP:           116/58 mmHg Patient Gender: F             HR:           77 bpm. Exam Location:  Inpatient Procedure: 2D Echo, Cardiac Doppler and Color Doppler (Both Spectral and Color            Flow Doppler were utilized during procedure).  Indications:    Stroke I63.9  History:        Patient has prior history of Echocardiogram examinations, most                 recent 06/16/2023.  Sonographer:    Hersey Lorenzo RDCS Referring Phys: [phone] TIMOTHY S OPYD IMPRESSIONS  1. Left ventricular ejection fraction, by estimation, is 65 to 70%. The left ventricle has normal function. The left ventricle has no regional wall motion abnormalities. Left ventricular diastolic parameters are indeterminate.  2. Right ventricular systolic function is normal. The right ventricular size is normal. Tricuspid regurgitation signal is inadequate for assessing PA pressure.  3. The mitral valve is grossly normal. Trivial mitral valve regurgitation.  4. The aortic valve is tricuspid. There is mild calcification of the aortic valve. Aortic valve regurgitation is not visualized. Aortic valve sclerosis/calcification is present, without any evidence of aortic stenosis.  5. The inferior vena cava is normal in size with greater than 50% respiratory variability, suggesting right atrial pressure of 3 mmHg. Comparison(s): Prior images reviewed side by side. LVEF 65-70% range. Mildly calcified aortic valve without stenosis. Interatrial septum bows to the right, no obvious shunting. FINDINGS  Left Ventricle: Left ventricular ejection fraction, by estimation, is 65 to 70%. The left ventricle has normal function. The left ventricle has no regional wall motion abnormalities. The left ventricular internal cavity size was normal in size. There is  borderline concentric left ventricular hypertrophy. Left ventricular diastolic parameters are indeterminate. Right Ventricle: The right ventricular size is normal. No increase in right ventricular wall thickness. Right ventricular systolic function is normal. Tricuspid regurgitation signal is inadequate for assessing PA pressure. Left Atrium: Left atrial size was normal in size. Right Atrium: Right atrial size was normal in size. Pericardium: There is  no evidence of pericardial effusion. Mitral Valve: The mitral valve is grossly normal. Trivial mitral valve regurgitation. Tricuspid Valve: The tricuspid valve is grossly normal. Tricuspid valve regurgitation is trivial. Aortic Valve: The aortic valve is tricuspid. There is mild calcification of the aortic valve. There is mild aortic valve annular calcification. Aortic valve regurgitation is not visualized. Aortic valve sclerosis/calcification is present, without any evidence of aortic stenosis. Pulmonic Valve: The pulmonic valve was grossly normal. Pulmonic valve regurgitation is  trivial. Aorta: The aortic root and ascending aorta are structurally normal, with no evidence of dilitation. Venous: The inferior vena cava is normal in size with greater than 50% respiratory variability, suggesting right atrial pressure of 3 mmHg. IAS/Shunts: There is right bowing of the interatrial septum, suggestive of elevated left atrial pressure. No atrial level shunt detected by color flow Doppler. Additional Comments: 3D was performed not requiring image post processing on an independent workstation and was indeterminate.  LEFT VENTRICLE PLAX 2D LVIDd:         5.10 cm   Diastology LVIDs:         3.30 cm   LV e' medial:    8.59 cm/s LV PW:         1.10 cm   LV E/e' medial:  9.8 LV IVS:        1.00 cm   LV e' lateral:   9.79 cm/s LVOT diam:     1.90 cm   LV E/e' lateral: 8.6 LV SV:         60 LV SV Index:   29 LVOT Area:     2.84 cm  RIGHT VENTRICLE         IVC TAPSE (M-mode): 1.7 cm  IVC diam: 1.40 cm LEFT ATRIUM             Index        RIGHT ATRIUM           Index LA diam:        3.30 cm 1.60 cm/m   RA Area:     13.70 cm LA Vol (A2C):   33.8 ml 16.38 ml/m  RA Volume:   33.40 ml  16.19 ml/m LA Vol (A4C):   53.4 ml 25.88 ml/m LA Biplane Vol: 47.1 ml 22.83 ml/m  AORTIC VALVE LVOT Vmax:   112.00 cm/s LVOT Vmean:  67.700 cm/s LVOT VTI:    0.211 m  AORTA Ao Root diam: 3.20 cm Ao Asc diam:  3.70 cm MITRAL VALVE MV Area (PHT): 3.00  cm     SHUNTS MV Decel Time: 253 msec     Systemic VTI:  0.21 m MV E velocity: 84.00 cm/s   Systemic Diam: 1.90 cm MV A velocity: 119.00 cm/s MV E/A ratio:  0.71 Teddie Favre MD Electronically signed by Teddie Favre MD Signature Date/Time: 11/21/2023/3:04:47 PM    Final    MR ANGIO HEAD WO CONTRAST Result Date: 11/21/2023 CLINICAL DATA:  68 year old female neurologic deficit. Code stroke presentation yesterday. 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 EXAM: MRA HEAD WITHOUT CONTRAST TECHNIQUE: Angiographic images of the Circle of Willis were acquired using MRA technique without intravenous contrast. COMPARISON:  Brain MRI today.  CTA head and neck yesterday. FINDINGS: Anterior circulation: Antegrade flow in both ICA siphons. Patent carotid termini. Bilateral siphon irregularity but no significant siphon stenosis. MCA and ACA origins appear normal. Anterior communicating artery and median artery of the corpus callosum are present. Left ACA appears dominant throughout. Severe stenosis of the proximal right ACA A2 on series 1033, image 13. Bilateral MCA M1 segments and bifurcations are patent. Multifocal moderate and severe bilateral MCA branch irregularity and stenosis as seen on series 1033, images 4 and 14. Posterior circulation: Patent distal vertebral arteries, vertebrobasilar junction, basilar artery without stenosis. Patent PCA origins. Posterior communicating arteries are diminutive or absent. Moderate to severe stenosis left PCA P2 segment (series 1021, image 4). Mild contralateral right P1 and P2 segment stenosis. Evidence of moderate  and severe PCA P3 segment stenosis, including on the right side series 1021, image 17. Anatomic variants: Median artery of the corpus callosum. Other: Brain MRI today reported separately. IMPRESSION: 1. Negative for large vessel occlusion. 2. Positive for advanced intracranial atherosclerosis. No significant 1st order vessel stenosis, but moderate to severe  stenosis in anterior and posterior circulation 2nd order and distal branches. 3. MRI today reported separately. Electronically Signed   By: Marlise Simpers M.D.   On: 11/21/2023 12:11   MR BRAIN WO CONTRAST Result Date: 11/21/2023 CLINICAL DATA:  68 year old female neurologic deficit. Code stroke presentation yesterday. 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 EXAM: MRI HEAD WITHOUT CONTRAST TECHNIQUE: Multiplanar, multiecho pulse sequences of the brain and surrounding structures were obtained without intravenous contrast. COMPARISON:  CT head and CTA head and neck yesterday. Brain MRI 05/21/2023. FINDINGS: Brain: No restricted diffusion to suggest acute infarction. No midline shift, mass effect, evidence of mass lesion, ventriculomegaly, extra-axial collection or acute intracranial hemorrhage. Cervicomedullary junction and pituitary are within normal limits. Numerous chronic microhemorrhages, concentrated in the brainstem, periventricular white matter, right mesial temporal lobe as before. Superimposed advanced, confluent bilateral cerebral white matter T2 and FLAIR hyperintensity. Chronic lacunar infarcts in the bilateral deep white matter capsules, bilateral deep gray nuclei, brainstem, bilateral cerebellum. Regressed mild brainstem edema seen in December. No new signal abnormality identified. Vascular: Major intracranial vascular flow voids are stable since last year. MRA today is reported separately. Skull and upper cervical spine: Stable and negative. Sinuses/Orbits: Stable and negative. Other: Stable trace mastoid effusions. Negative visible scalp and face. IMPRESSION: 1. No acute intracranial abnormality. 2. Expected evolution of small brainstem hemorrhage since December. And otherwise stable severe underlying chronic small vessel disease. 3. MRA reported separately. Electronically Signed   By: Marlise Simpers M.D.   On: 11/21/2023 12:07   CT ANGIO HEAD NECK W WO CM (CODE STROKE) Result Date:  11/20/2023 CLINICAL DATA:  Initial evaluation for acute neuro deficit, stroke suspected. EXAM: CT ANGIOGRAPHY HEAD AND NECK WITH AND WITHOUT CONTRAST TECHNIQUE: Multidetector CT imaging of the head and neck was performed using the standard protocol during bolus administration of intravenous contrast. Multiplanar CT image reconstructions and MIPs were obtained to evaluate the vascular anatomy. Carotid stenosis measurements (when applicable) are obtained utilizing NASCET criteria, using the distal internal carotid diameter as the denominator. RADIATION DOSE REDUCTION: This exam was performed according to the departmental dose-optimization program which includes automated exposure control, adjustment of the mA and/or kV according to patient size and/or use of iterative reconstruction technique. CONTRAST:  75mL OMNIPAQUE  IOHEXOL  350 MG/ML SOLN COMPARISON:  Comparison made with head CT from earlier the same day as well as prior CTA from 05/20/2023. FINDINGS: CTA NECK FINDINGS Aortic arch: Visualized aortic arch within normal limits for caliber with standard 3 vessel morphology. Aortic atherosclerosis. No significant stenosis about the origin the great vessels. Right carotid system: Right common and internal carotid arteries are patent without dissection. Mild atheromatous change about the right carotid bulb without hemodynamically significant greater than 50% stenosis. Left carotid system: Left common and internal carotid arteries are patent without dissection. Mild atheromatous change about the left carotid bulb without hemodynamically significant stenosis. Vertebral arteries: Both vertebral arteries arise from subclavian arteries. Vertebral arteries are patent without stenosis or dissection. Skeleton: No worrisome osseous lesions. Other neck: No other acute finding. Upper chest: No other acute finding. Review of the MIP images confirms the above findings CTA HEAD FINDINGS Anterior circulation: Atheromatous change about  the  carotid siphons with associated mild to moderate multifocal narrowing bilaterally. A1 segments patent bilaterally. Normal anterior communicating artery complex. Anterior cerebral arteries patent without significant stenosis. No M1 stenosis or occlusion. Distal MCA branches perfused and symmetric. Posterior circulation: Both V4 segments patent without stenosis. Both PICA patent at their origins. Basilar patent without stenosis. Superior cerebral arteries patent bilaterally. Both PCAs primarily supplied via the basilar. Atheromatous irregularity about the PCAs bilaterally the with associated mild to moderate bilateral P2 stenoses. PCAs remain patent to their distal aspects. Venous sinuses: Patent allowing for timing the contrast bolus. Anatomic variants: None significant.  No aneurysm. Review of the MIP images confirms the above findings IMPRESSION: 1. Negative CTA for large vessel occlusion or other emergent finding. 2. Intracranial atherosclerotic disease, most notably about the carotid siphons and PCAs where there are associated mild to moderate multifocal stenoses. 3. Mild atheromatous change about the carotid bifurcations without significant stenosis. No hemodynamically significant stenosis within the neck. 4. Aortic Atherosclerosis (ICD10-I70.0). Results were called by telephone at the time of interpretation on 11/20/2023 at 10:18 pm to provider [facility] , who verbally acknowledged these results. Electronically Signed   By: Virgia Griffins M.D.   On: 11/20/2023 22:19   CT HEAD CODE STROKE WO CONTRAST Result Date: 11/20/2023 CLINICAL DATA:  Code stroke. Initial evaluation for acute neuro deficit, stroke suspected. EXAM: CT HEAD WITHOUT CONTRAST TECHNIQUE: Contiguous axial images were obtained from the base of the skull through the vertex without intravenous contrast. RADIATION DOSE REDUCTION: This exam was performed according to the departmental dose-optimization program which includes automated  exposure control, adjustment of the mA and/or kV according to patient size and/or use of iterative reconstruction technique. COMPARISON:  Prior study from 05/20/2023 FINDINGS: Brain: Generalized age-related cerebral atrophy. Patchy hypodensity involving the supratentorial cerebral white matter, consistent chronic small vessel ischemic disease, advanced in nature. Remote right basal ganglia lacunar infarct. Additional chronic left cerebellar infarct. No acute intracranial hemorrhage. No acute large vessel territory infarct. No mass lesion or midline shift. No hydrocephalus or extra-axial fluid collection. Vascular: No abnormal hyperdense vessel. Calcified atherosclerosis present about the skull base. Skull: Scalp soft tissues within normal limits.  Calvarium intact. Sinuses/Orbits: Globes and orbital soft tissues within normal limits. Small osteoma noted within the right ethmoidal air cells. Paranasal sinuses are largely clear. No significant mastoid effusion. Other: None. ASPECTS [facility] Stroke Program Early CT Score) - Ganglionic level infarction (caudate, lentiform nuclei, internal capsule, insula, M1-M3 cortex): 7 - Supraganglionic infarction (M4-M6 cortex): 3 Total score (0-10 with 10 being normal): 10 IMPRESSION: 1. No acute intracranial abnormality. 2. ASPECTS is 10. 3. Atrophy with advanced chronic microvascular ischemic disease, with chronic right basal ganglia and left cerebellar infarcts. Results were called by telephone at the time of interpretation on 11/20/2023 at 9:55 pm to provider [facility] , who verbally acknowledged these results. Electronically Signed   By: Virgia Griffins M.D.   On: 11/20/2023 22:00     Discharge Exam: Vitals:   11/25/23 0821 11/25/23 0827  BP: (!) 150/51 (!) 150/50  Pulse: 90   Resp: 12   Temp: 98.2 F (36.8 C)   SpO2: 96%    Vitals:   11/25/23 0500 11/25/23 0821 11/25/23 0825 11/25/23 0827  BP: (!) 129/48 (!) 150/51  (!) 150/50  Pulse: 92 90    Resp: 16  12    Temp: 98.5 F (36.9 C) 98.2 F (36.8 C)    TempSrc: Oral Axillary    SpO2: 95% 96%  Weight:   93 kg   Height:        General: Pt is alert, awake, not in acute distress Cardiovascular: RRR, S1/S2 +, no rubs, no gallops Respiratory: CTA bilaterally, no wheezing, no rhonchi Abdominal: Soft, NT, ND, bowel sounds + Extremities: no edema, no cyanosis    The results of significant diagnostics from this hospitalization (including imaging, microbiology, ancillary and laboratory) are listed below for reference.     Microbiology: Recent Results (from the past 240 hours)  Urine Culture (for pregnant, neutropenic or urologic patients or patients with an indwelling urinary catheter)     Status: Abnormal   Collection Time: 11/20/23 10:15 PM   Specimen: In/Out Cath Urine  Result Value Ref Range Status   Specimen Description   Final    IN/OUT CATH URINE Performed at [facility], [address] [place], [place] [address]    Special Requests   Final    NONE Performed at [facility], [address] [place], [place] [address]    Culture (A)  Final    >=100,000 COLONIES/mL ESCHERICHIA COLI 40,000 COLONIES/mL PROTEUS MIRABILIS    Report Status 11/23/2023 FINAL  Final   Organism ID, Bacteria ESCHERICHIA COLI (A)  Final   Organism ID, Bacteria PROTEUS MIRABILIS (A)  Final      Susceptibility   Escherichia coli - MIC*    AMPICILLIN >=32 RESISTANT Resistant     CEFAZOLIN  <=4 SENSITIVE Sensitive     CEFEPIME <=0.12 SENSITIVE Sensitive     CEFTRIAXONE  <=0.25 SENSITIVE Sensitive     CIPROFLOXACIN <=0.25 SENSITIVE Sensitive     GENTAMICIN >=16 RESISTANT Resistant     IMIPENEM <=0.25 SENSITIVE Sensitive     NITROFURANTOIN <=16 SENSITIVE Sensitive     TRIMETH/SULFA >=320 RESISTANT Resistant     AMPICILLIN/SULBACTAM 16 INTERMEDIATE Intermediate     PIP/TAZO <=4 SENSITIVE Sensitive ug/mL    * >=100,000 COLONIES/mL ESCHERICHIA COLI   Proteus mirabilis - MIC*    AMPICILLIN <=2 SENSITIVE  Sensitive     CEFAZOLIN  8 SENSITIVE Sensitive     CEFEPIME <=0.12 SENSITIVE Sensitive     CEFTRIAXONE  <=0.25 SENSITIVE Sensitive     CIPROFLOXACIN <=0.25 SENSITIVE Sensitive     GENTAMICIN <=1 SENSITIVE Sensitive     IMIPENEM 2 SENSITIVE Sensitive     NITROFURANTOIN 128 RESISTANT Resistant     TRIMETH/SULFA <=20 SENSITIVE Sensitive     AMPICILLIN/SULBACTAM <=2 SENSITIVE Sensitive     PIP/TAZO <=4 SENSITIVE Sensitive ug/mL    * 40,000 COLONIES/mL PROTEUS MIRABILIS     Labs: BNP (last 3 results) No results for input(s): BNP in the last 8760 hours. Basic Metabolic Panel: Recent Labs  Lab 11/21/23 0548 11/22/23 0348 11/23/23 0405 11/24/23 0407 11/25/23 0300  NA 134* 134* 135 138 136  K 4.2 3.7 4.3 4.0 4.4  CL 98 98 97* 99 100  CO2 24 28 26 28 28   GLUCOSE 61* 68* 91 94 131*  BUN 70* 46* 66* 36* 59*  CREATININE 6.10* 4.83* 5.96* 3.78* 5.20*  CALCIUM  8.1* 8.0* 7.9* 7.8* 7.9*  MG  --  2.0 2.2 2.1 2.3  PHOS  --  4.9* 6.0* 3.8 4.8*   Liver Function Tests: Recent Labs  Lab 11/20/23 2141 11/22/23 0348 11/23/23 0405 11/24/23 0407 11/25/23 0300  AST 18  --   --   --   --   ALT 13  --   --   --   --   ALKPHOS 65  --   --   --   --  BILITOT 0.8  --   --   --   --   PROT 4.9*  --   --   --   --   ALBUMIN 2.3* 2.1* 2.2* 2.2* 2.1*   No results for input(s): LIPASE, AMYLASE in the last 168 hours. No results for input(s): AMMONIA in the last 168 hours. CBC: Recent Labs  Lab 11/20/23 2141 11/20/23 2230 11/21/23 0548 11/22/23 0348 11/23/23 0405 11/24/23 0407 11/25/23 0300 11/25/23 0724  WBC 8.6  --  10.7* 8.5 7.9 6.8 7.0  --   NEUTROABS 6.5  --   --   --   --   --   --   --   HGB 10.2*   < > 10.4* 9.2* 8.8* 9.1* 8.0* 8.7*  HCT 31.5*   < > 32.2* 29.8* 28.2* 29.0* 26.5* 27.5*  MCV 97.5  --  96.1 97.4 96.2 96.7 99.3  --   PLT 239  --  180 218 253 242 219  --    < > = values in this interval not displayed.   Cardiac Enzymes: No results for input(s): CKTOTAL,  CKMB, CKMBINDEX, TROPONINI in the last 168 hours. BNP: Invalid input(s): POCBNP CBG: Recent Labs  Lab 11/24/23 1600 11/24/23 2001 11/25/23 0024 11/25/23 0442 11/25/23 0724  GLUCAP 158* 206* 140* 141* 111*   D-Dimer No results for input(s): DDIMER in the last 72 hours. Hgb A1c No results for input(s): HGBA1C in the last 72 hours. Lipid Profile No results for input(s): CHOL, HDL, LDLCALC, TRIG, CHOLHDL, LDLDIRECT in the last 72 hours. Thyroid  function studies No results for input(s): TSH, T4TOTAL, T3FREE, THYROIDAB in the last 72 hours.  Invalid input(s): FREET3 Anemia work up No results for input(s): VITAMINB12, FOLATE, FERRITIN, TIBC, IRON, RETICCTPCT in the last 72 hours. Urinalysis    Component Value Date/Time   COLORURINE YELLOW 11/20/2023 2215   APPEARANCEUR TURBID (A) 11/20/2023 2215   LABSPEC 1.014 11/20/2023 2215   PHURINE 7.0 11/20/2023 2215   GLUCOSEU NEGATIVE 11/20/2023 2215   HGBUR SMALL (A) 11/20/2023 2215   BILIRUBINUR NEGATIVE 11/20/2023 2215   BILIRUBINUR neg 11/19/2021 1015   KETONESUR NEGATIVE 11/20/2023 2215   PROTEINUR 100 (A) 11/20/2023 2215   UROBILINOGEN 0.2 11/19/2021 1015   NITRITE NEGATIVE 11/20/2023 2215   LEUKOCYTESUR MODERATE (A) 11/20/2023 2215   Sepsis Labs Recent Labs  Lab 11/22/23 0348 11/23/23 0405 11/24/23 0407 11/25/23 0300  WBC 8.5 7.9 6.8 7.0   Microbiology Recent Results (from the past 240 hours)  Urine Culture (for pregnant, neutropenic or urologic patients or patients with an indwelling urinary catheter)     Status: Abnormal   Collection Time: 11/20/23 10:15 PM   Specimen: In/Out Cath Urine  Result Value Ref Range Status   Specimen Description   Final    IN/OUT CATH URINE Performed at [facility], [address], [place], [place] [address]    Special Requests   Final    NONE Performed at [facility], [address] [place], [place] [address]    Culture (A)  Final     >=100,000 COLONIES/mL ESCHERICHIA COLI 40,000 COLONIES/mL PROTEUS MIRABILIS    Report Status 11/23/2023 FINAL  Final   Organism ID, Bacteria ESCHERICHIA COLI (A)  Final   Organism ID, Bacteria PROTEUS MIRABILIS (A)  Final      Susceptibility   Escherichia coli - MIC*    AMPICILLIN >=32 RESISTANT Resistant     CEFAZOLIN  <=4 SENSITIVE Sensitive     CEFEPIME <=0.12 SENSITIVE Sensitive  CEFTRIAXONE  <=0.25 SENSITIVE Sensitive     CIPROFLOXACIN <=0.25 SENSITIVE Sensitive     GENTAMICIN >=16 RESISTANT Resistant     IMIPENEM <=0.25 SENSITIVE Sensitive     NITROFURANTOIN <=16 SENSITIVE Sensitive     TRIMETH/SULFA >=320 RESISTANT Resistant     AMPICILLIN/SULBACTAM 16 INTERMEDIATE Intermediate     PIP/TAZO <=4 SENSITIVE Sensitive ug/mL    * >=100,000 COLONIES/mL ESCHERICHIA COLI   Proteus mirabilis - MIC*    AMPICILLIN <=2 SENSITIVE Sensitive     CEFAZOLIN  8 SENSITIVE Sensitive     CEFEPIME <=0.12 SENSITIVE Sensitive     CEFTRIAXONE  <=0.25 SENSITIVE Sensitive     CIPROFLOXACIN <=0.25 SENSITIVE Sensitive     GENTAMICIN <=1 SENSITIVE Sensitive     IMIPENEM 2 SENSITIVE Sensitive     NITROFURANTOIN 128 RESISTANT Resistant     TRIMETH/SULFA <=20 SENSITIVE Sensitive     AMPICILLIN/SULBACTAM <=2 SENSITIVE Sensitive     PIP/TAZO <=4 SENSITIVE Sensitive ug/mL    * 40,000 COLONIES/mL PROTEUS MIRABILIS     Time coordinating discharge: 35 minutes  SIGNED:   Cornelius Dill, DO [facility] 11/25/2023, 10:16 AM  If 7PM-7AM, please contact night-coverage [email or web address]

## 2023-11-26 DIAGNOSIS — R531 Weakness: Secondary | ICD-10-CM | POA: Diagnosis not present

## 2023-11-26 LAB — RENAL FUNCTION PANEL
Albumin: 2.3 g/dL — ABNORMAL LOW (ref 3.5–5.0)
Anion gap: 12 (ref 5–15)
BUN: 33 mg/dL — ABNORMAL HIGH (ref 8–23)
CO2: 25 mmol/L (ref 22–32)
Calcium: 7.8 mg/dL — ABNORMAL LOW (ref 8.9–10.3)
Chloride: 97 mmol/L — ABNORMAL LOW (ref 98–111)
Creatinine, Ser: 4.02 mg/dL — ABNORMAL HIGH (ref 0.44–1.00)
GFR, Estimated: 12 mL/min — ABNORMAL LOW (ref >60)
Glucose, Bld: 124 mg/dL — ABNORMAL HIGH (ref 70–99)
Phosphorus: 3.4 mg/dL (ref 2.5–4.6)
Potassium: 3.7 mmol/L (ref 3.5–5.1)
Sodium: 134 mmol/L — ABNORMAL LOW (ref 135–145)

## 2023-11-26 LAB — GLUCOSE, CAPILLARY
Glucose-Capillary: 107 mg/dL — ABNORMAL HIGH (ref 70–99)
Glucose-Capillary: 130 mg/dL — ABNORMAL HIGH (ref 70–99)
Glucose-Capillary: 141 mg/dL — ABNORMAL HIGH (ref 70–99)

## 2023-11-26 NOTE — Progress Notes (Signed)
 Pt still awaiting EMS transport to [place] facility, daughter present at bedside. Left arm IV dressing was removed during the prior shift due to discharge, old IV site noted to have small white head appear on insertion site, RN made aware and site was cleansed and band aid applied. Will continue to monitor

## 2023-11-26 NOTE — TOC Transition Note (Signed)
 Transition of Care [facility] - Discharge Note   Patient Details  Name: Jennifer [name] MRN: [id number] Date of Birth: 1955/10/11  [facility]) CM/SW Contact:  Grandville Lax, LCSWA Phone Number: 11/26/2023, 10:22 AM   Clinical Narrative:    CSW updated that EMS did not pick up pt. CSW spoke to EMS who states they do not have a convo truck this weekend but they still have pt on list. CSW updated during progression from treatment team that pt can ride in wheelchair via [facility]. CSW spoke to [place] with [facility] who confirms they can do pick up at 11. RN updated of plan for [facility]. CSW spoke to Diplomatic Services operational officer with [facility] and provided number for nurses station, she will have SNF RN call to speak with pts RN, CSW updated floor RN of this. CSW updated pts daughter of plan for D/C and transport arranged. TOC signing off.   Final next level of care: Skilled Nursing Facility Barriers to Discharge: Barriers Resolved   Patient Goals and CMS Choice Patient states their goals for this hospitalization and ongoing recovery are:: agreeable to SNF CMS Medicare.gov Compare Post Acute Care list provided to:: Patient Represenative (must comment) Choice offered to / list presented to : Adult Children      Discharge Placement                Patient to be transferred to facility by: EMS Name of family member notified: Cathleen Coach Patient and family notified of of transfer: 11/25/23  Discharge Plan and Services Additional resources added to the After Visit Summary for                                       Social Drivers of Health (SDOH) Interventions SDOH Screenings   Food Insecurity: No Food Insecurity (11/21/2023)  Housing: Low Risk  (11/21/2023)  Transportation Needs: No Transportation Needs (11/21/2023)  Utilities: Not At Risk (11/21/2023)  Alcohol  Screen: Low Risk  (03/07/2023)  Depression (PHQ2-9): High Risk (05/30/2023)  Financial Resource Strain: Low Risk  (03/07/2023)  Physical  Activity: Inactive (03/07/2023)  Social Connections: Socially Integrated (11/21/2023)  Stress: No Stress Concern Present (03/07/2023)  Tobacco Use: Medium Risk (11/20/2023)  Health Literacy: Adequate Health Literacy (03/07/2023)     Readmission Risk Interventions     No data to display

## 2023-11-26 NOTE — Plan of Care (Signed)
  Problem: Coping: Goal: Ability to adjust to condition or change in health will improve Outcome: Completed/Met   Problem: Fluid Volume: Goal: Ability to maintain a balanced intake and output will improve Outcome: Completed/Met   Problem: Health Behavior/Discharge Planning: Goal: Ability to identify and utilize available resources and services will improve Outcome: Completed/Met Goal: Ability to manage health-related needs will improve Outcome: Completed/Met   Problem: Metabolic: Goal: Ability to maintain appropriate glucose levels will improve Outcome: Completed/Met   Problem: Nutritional: Goal: Maintenance of adequate nutrition will improve Outcome: Completed/Met Goal: Progress toward achieving an optimal weight will improve Outcome: Completed/Met   Problem: Skin Integrity: Goal: Risk for impaired skin integrity will decrease Outcome: Completed/Met   Problem: Tissue Perfusion: Goal: Adequacy of tissue perfusion will improve Outcome: Completed/Met   Problem: Ischemic Stroke/TIA Tissue Perfusion: Goal: Complications of ischemic stroke/TIA will be minimized Outcome: Completed/Met   Problem: Coping: Goal: Will verbalize positive feelings about self Outcome: Completed/Met Goal: Will identify appropriate support needs Outcome: Completed/Met   Problem: Health Behavior/Discharge Planning: Goal: Ability to manage health-related needs will improve Outcome: Completed/Met Goal: Goals will be collaboratively established with patient/family Outcome: Completed/Met   Problem: Self-Care: Goal: Ability to participate in self-care as condition permits will improve Outcome: Completed/Met Goal: Verbalization of feelings and concerns over difficulty with self-care will improve Outcome: Completed/Met Goal: Ability to communicate needs accurately will improve Outcome: Completed/Met   Problem: Nutrition: Goal: Risk of aspiration will decrease Outcome: Completed/Met Goal: Dietary  intake will improve Outcome: Completed/Met   Problem: Education: Goal: Knowledge of General Education information will improve Description: Including pain rating scale, medication(s)/side effects and non-pharmacologic comfort measures Outcome: Completed/Met   Problem: Health Behavior/Discharge Planning: Goal: Ability to manage health-related needs will improve Outcome: Completed/Met   Problem: Clinical Measurements: Goal: Ability to maintain clinical measurements within normal limits will improve Outcome: Completed/Met Goal: Will remain free from infection Outcome: Completed/Met Goal: Diagnostic test results will improve Outcome: Completed/Met Goal: Respiratory complications will improve Outcome: Completed/Met Goal: Cardiovascular complication will be avoided Outcome: Completed/Met   Problem: Activity: Goal: Risk for activity intolerance will decrease Outcome: Completed/Met   Problem: Nutrition: Goal: Adequate nutrition will be maintained Outcome: Completed/Met   Problem: Coping: Goal: Level of anxiety will decrease Outcome: Completed/Met   Problem: Elimination: Goal: Will not experience complications related to bowel motility Outcome: Completed/Met Goal: Will not experience complications related to urinary retention Outcome: Completed/Met   Problem: Pain Managment: Goal: General experience of comfort will improve and/or be controlled Outcome: Completed/Met   Problem: Safety: Goal: Ability to remain free from injury will improve Outcome: Completed/Met   Problem: Skin Integrity: Goal: Risk for impaired skin integrity will decrease Outcome: Completed/Met

## 2023-11-26 NOTE — Plan of Care (Signed)
  Problem: Coping: Goal: Ability to adjust to condition or change in health will improve Outcome: Progressing   Problem: Health Behavior/Discharge Planning: Goal: Ability to identify and utilize available resources and services will improve Outcome: Progressing Goal: Ability to manage health-related needs will improve Outcome: Progressing   Problem: Nutritional: Goal: Maintenance of adequate nutrition will improve Outcome: Progressing Goal: Progress toward achieving an optimal weight will improve Outcome: Progressing   Problem: Ischemic Stroke/TIA Tissue Perfusion: Goal: Complications of ischemic stroke/TIA will be minimized Outcome: Progressing   Problem: Coping: Goal: Will verbalize positive feelings about self Outcome: Progressing Goal: Will identify appropriate support needs Outcome: Progressing   Problem: Health Behavior/Discharge Planning: Goal: Ability to manage health-related needs will improve Outcome: Progressing Goal: Goals will be collaboratively established with patient/family Outcome: Progressing

## 2023-11-26 NOTE — Progress Notes (Signed)
 Patient seen and evaluated today with no acute overnight events or concerns noted.  She has had a delay in discharge on account of lack of transportation to her facility.  Please refer to discharge summary dictated 6/14 for full details.  Family member currently at bedside.  Total care time: 15 minutes.

## 2023-11-27 ENCOUNTER — Observation Stay (HOSPITAL_COMMUNITY)

## 2023-11-27 ENCOUNTER — Other Ambulatory Visit (HOSPITAL_COMMUNITY)

## 2023-11-27 ENCOUNTER — Other Ambulatory Visit (HOSPITAL_COMMUNITY): Payer: Self-pay | Admitting: Internal Medicine

## 2023-11-27 ENCOUNTER — Other Ambulatory Visit: Payer: Self-pay

## 2023-11-27 ENCOUNTER — Encounter (HOSPITAL_COMMUNITY): Payer: Self-pay | Admitting: Emergency Medicine

## 2023-11-27 ENCOUNTER — Inpatient Hospital Stay (HOSPITAL_COMMUNITY)
Admission: EM | Admit: 2023-11-27 | Discharge: 2023-12-04 | DRG: 871 | Disposition: A | Discharge status: Skilled Nursing Facility | Attending: Internal Medicine | Admitting: Internal Medicine

## 2023-11-27 ENCOUNTER — Emergency Department (HOSPITAL_COMMUNITY)

## 2023-11-27 DIAGNOSIS — E162 Hypoglycemia, unspecified: Secondary | ICD-10-CM

## 2023-11-27 DIAGNOSIS — R413 Other amnesia: Secondary | ICD-10-CM | POA: Diagnosis present

## 2023-11-27 DIAGNOSIS — R6510 Systemic inflammatory response syndrome (SIRS) of non-infectious origin without acute organ dysfunction: Secondary | ICD-10-CM | POA: Diagnosis not present

## 2023-11-27 DIAGNOSIS — R29711 NIHSS score 11: Secondary | ICD-10-CM | POA: Diagnosis present

## 2023-11-27 DIAGNOSIS — Z6831 Body mass index (BMI) 31.0-31.9, adult: Secondary | ICD-10-CM

## 2023-11-27 DIAGNOSIS — Z823 Family history of stroke: Secondary | ICD-10-CM

## 2023-11-27 DIAGNOSIS — F32A Depression, unspecified: Secondary | ICD-10-CM | POA: Diagnosis present

## 2023-11-27 DIAGNOSIS — J9601 Acute respiratory failure with hypoxia: Secondary | ICD-10-CM | POA: Diagnosis not present

## 2023-11-27 DIAGNOSIS — Z7901 Long term (current) use of anticoagulants: Secondary | ICD-10-CM

## 2023-11-27 DIAGNOSIS — I10 Essential (primary) hypertension: Secondary | ICD-10-CM | POA: Diagnosis present

## 2023-11-27 DIAGNOSIS — Z955 Presence of coronary angioplasty implant and graft: Secondary | ICD-10-CM

## 2023-11-27 DIAGNOSIS — N186 End stage renal disease: Secondary | ICD-10-CM | POA: Diagnosis not present

## 2023-11-27 DIAGNOSIS — R0602 Shortness of breath: Secondary | ICD-10-CM | POA: Diagnosis not present

## 2023-11-27 DIAGNOSIS — E782 Mixed hyperlipidemia: Secondary | ICD-10-CM

## 2023-11-27 DIAGNOSIS — E039 Hypothyroidism, unspecified: Secondary | ICD-10-CM | POA: Diagnosis present

## 2023-11-27 DIAGNOSIS — E7800 Pure hypercholesterolemia, unspecified: Secondary | ICD-10-CM | POA: Diagnosis present

## 2023-11-27 DIAGNOSIS — F419 Anxiety disorder, unspecified: Secondary | ICD-10-CM | POA: Diagnosis present

## 2023-11-27 DIAGNOSIS — Z993 Dependence on wheelchair: Secondary | ICD-10-CM

## 2023-11-27 DIAGNOSIS — F0153 Vascular dementia, unspecified severity, with mood disturbance: Secondary | ICD-10-CM | POA: Diagnosis present

## 2023-11-27 DIAGNOSIS — F0154 Vascular dementia, unspecified severity, with anxiety: Secondary | ICD-10-CM | POA: Diagnosis present

## 2023-11-27 DIAGNOSIS — I120 Hypertensive chronic kidney disease with stage 5 chronic kidney disease or end stage renal disease: Secondary | ICD-10-CM | POA: Diagnosis present

## 2023-11-27 DIAGNOSIS — Z992 Dependence on renal dialysis: Secondary | ICD-10-CM

## 2023-11-27 DIAGNOSIS — E11649 Type 2 diabetes mellitus with hypoglycemia without coma: Secondary | ICD-10-CM | POA: Diagnosis present

## 2023-11-27 DIAGNOSIS — A419 Sepsis, unspecified organism: Secondary | ICD-10-CM | POA: Diagnosis not present

## 2023-11-27 DIAGNOSIS — G9341 Metabolic encephalopathy: Secondary | ICD-10-CM | POA: Diagnosis present

## 2023-11-27 DIAGNOSIS — E66811 Obesity, class 1: Secondary | ICD-10-CM | POA: Diagnosis present

## 2023-11-27 DIAGNOSIS — Z87891 Personal history of nicotine dependence: Secondary | ICD-10-CM

## 2023-11-27 DIAGNOSIS — D649 Anemia, unspecified: Secondary | ICD-10-CM | POA: Diagnosis present

## 2023-11-27 DIAGNOSIS — Z96651 Presence of right artificial knee joint: Secondary | ICD-10-CM | POA: Diagnosis present

## 2023-11-27 DIAGNOSIS — Z86711 Personal history of pulmonary embolism: Secondary | ICD-10-CM | POA: Diagnosis present

## 2023-11-27 DIAGNOSIS — Z8673 Personal history of transient ischemic attack (TIA), and cerebral infarction without residual deficits: Secondary | ICD-10-CM

## 2023-11-27 DIAGNOSIS — J159 Unspecified bacterial pneumonia: Secondary | ICD-10-CM | POA: Diagnosis present

## 2023-11-27 DIAGNOSIS — Z8249 Family history of ischemic heart disease and other diseases of the circulatory system: Secondary | ICD-10-CM

## 2023-11-27 DIAGNOSIS — R29810 Facial weakness: Secondary | ICD-10-CM | POA: Diagnosis present

## 2023-11-27 DIAGNOSIS — I2510 Atherosclerotic heart disease of native coronary artery without angina pectoris: Secondary | ICD-10-CM | POA: Diagnosis present

## 2023-11-27 DIAGNOSIS — Z794 Long term (current) use of insulin: Secondary | ICD-10-CM

## 2023-11-27 DIAGNOSIS — R680 Hypothermia, not associated with low environmental temperature: Secondary | ICD-10-CM | POA: Diagnosis present

## 2023-11-27 DIAGNOSIS — E1122 Type 2 diabetes mellitus with diabetic chronic kidney disease: Secondary | ICD-10-CM | POA: Diagnosis present

## 2023-11-27 DIAGNOSIS — R401 Stupor: Principal | ICD-10-CM

## 2023-11-27 DIAGNOSIS — T383X5A Adverse effect of insulin and oral hypoglycemic [antidiabetic] drugs, initial encounter: Secondary | ICD-10-CM | POA: Diagnosis present

## 2023-11-27 DIAGNOSIS — Z7982 Long term (current) use of aspirin: Secondary | ICD-10-CM

## 2023-11-27 DIAGNOSIS — I2699 Other pulmonary embolism without acute cor pulmonale: Secondary | ICD-10-CM | POA: Diagnosis present

## 2023-11-27 DIAGNOSIS — Z79890 Hormone replacement therapy (postmenopausal): Secondary | ICD-10-CM

## 2023-11-27 DIAGNOSIS — D631 Anemia in chronic kidney disease: Secondary | ICD-10-CM | POA: Diagnosis present

## 2023-11-27 DIAGNOSIS — R1310 Dysphagia, unspecified: Secondary | ICD-10-CM | POA: Diagnosis present

## 2023-11-27 DIAGNOSIS — T68XXXA Hypothermia, initial encounter: Secondary | ICD-10-CM

## 2023-11-27 DIAGNOSIS — Z79899 Other long term (current) drug therapy: Secondary | ICD-10-CM

## 2023-11-27 DIAGNOSIS — Z90710 Acquired absence of both cervix and uterus: Secondary | ICD-10-CM

## 2023-11-27 DIAGNOSIS — E785 Hyperlipidemia, unspecified: Secondary | ICD-10-CM | POA: Diagnosis present

## 2023-11-27 DIAGNOSIS — I6381 Other cerebral infarction due to occlusion or stenosis of small artery: Secondary | ICD-10-CM | POA: Diagnosis present

## 2023-11-27 LAB — I-STAT CHEM 8, ED
BUN: 45 mg/dL — ABNORMAL HIGH (ref 8–23)
Calcium, Ion: 1.14 mmol/L — ABNORMAL LOW (ref 1.15–1.40)
Chloride: 96 mmol/L — ABNORMAL LOW (ref 98–111)
Creatinine, Ser: 6 mg/dL — ABNORMAL HIGH (ref 0.44–1.00)
Glucose, Bld: 108 mg/dL — ABNORMAL HIGH (ref 70–99)
HCT: 29 % — ABNORMAL LOW (ref 36.0–46.0)
Hemoglobin: 9.9 g/dL — ABNORMAL LOW (ref 12.0–15.0)
Potassium: 4.4 mmol/L (ref 3.5–5.1)
Sodium: 137 mmol/L (ref 135–145)
TCO2: 29 mmol/L (ref 22–32)

## 2023-11-27 LAB — COMPREHENSIVE METABOLIC PANEL WITH GFR
ALT: 13 U/L (ref 0–44)
AST: 21 U/L (ref 15–41)
Albumin: 2.2 g/dL — ABNORMAL LOW (ref 3.5–5.0)
Alkaline Phosphatase: 54 U/L (ref 38–126)
Anion gap: 11 (ref 5–15)
BUN: 45 mg/dL — ABNORMAL HIGH (ref 8–23)
CO2: 28 mmol/L (ref 22–32)
Calcium: 8.6 mg/dL — ABNORMAL LOW (ref 8.9–10.3)
Chloride: 98 mmol/L (ref 98–111)
Creatinine, Ser: 5.64 mg/dL — ABNORMAL HIGH (ref 0.44–1.00)
GFR, Estimated: 8 mL/min — ABNORMAL LOW (ref >60)
Glucose, Bld: 109 mg/dL — ABNORMAL HIGH (ref 70–99)
Potassium: 4.6 mmol/L (ref 3.5–5.1)
Sodium: 137 mmol/L (ref 135–145)
Total Bilirubin: 0.6 mg/dL (ref 0.0–1.2)
Total Protein: 5.2 g/dL — ABNORMAL LOW (ref 6.5–8.1)

## 2023-11-27 LAB — I-STAT ARTERIAL BLOOD GAS, ED
Acid-Base Excess: 6 mmol/L — ABNORMAL HIGH (ref 0.0–2.0)
Bicarbonate: 30.4 mmol/L — ABNORMAL HIGH (ref 20.0–28.0)
Calcium, Ion: 1.2 mmol/L (ref 1.15–1.40)
HCT: 25 % — ABNORMAL LOW (ref 36.0–46.0)
Hemoglobin: 8.5 g/dL — ABNORMAL LOW (ref 12.0–15.0)
O2 Saturation: 95 %
Patient temperature: 94.9
Potassium: 4.4 mmol/L (ref 3.5–5.1)
Sodium: 135 mmol/L (ref 135–145)
TCO2: 32 mmol/L (ref 22–32)
pCO2 arterial: 38 mmHg (ref 32–48)
pH, Arterial: 7.503 — ABNORMAL HIGH (ref 7.35–7.45)
pO2, Arterial: 61 mmHg — ABNORMAL LOW (ref 83–108)

## 2023-11-27 LAB — DIFFERENTIAL
Abs Immature Granulocytes: 0.1 10*3/uL — ABNORMAL HIGH (ref 0.00–0.07)
Basophils Absolute: 0.1 10*3/uL (ref 0.0–0.1)
Basophils Relative: 0 %
Eosinophils Absolute: 0 10*3/uL (ref 0.0–0.5)
Eosinophils Relative: 0 %
Immature Granulocytes: 1 %
Lymphocytes Relative: 6 %
Lymphs Abs: 0.7 10*3/uL (ref 0.7–4.0)
Monocytes Absolute: 0.6 10*3/uL (ref 0.1–1.0)
Monocytes Relative: 5 %
Neutro Abs: 10 10*3/uL — ABNORMAL HIGH (ref 1.7–7.7)
Neutrophils Relative %: 88 %

## 2023-11-27 LAB — PROTIME-INR
INR: 1.1 (ref 0.8–1.2)
Prothrombin Time: 13.9 s (ref 11.4–15.2)

## 2023-11-27 LAB — CBC
HCT: 30.8 % — ABNORMAL LOW (ref 36.0–46.0)
Hemoglobin: 9.6 g/dL — ABNORMAL LOW (ref 12.0–15.0)
MCH: 31.1 pg (ref 26.0–34.0)
MCHC: 31.2 g/dL (ref 30.0–36.0)
MCV: 99.7 fL (ref 80.0–100.0)
Platelets: 266 10*3/uL (ref 150–400)
RBC: 3.09 MIL/uL — ABNORMAL LOW (ref 3.87–5.11)
RDW: 15.9 % — ABNORMAL HIGH (ref 11.5–15.5)
WBC: 11.4 10*3/uL — ABNORMAL HIGH (ref 4.0–10.5)
nRBC: 0 % (ref 0.0–0.2)

## 2023-11-27 LAB — CBG MONITORING, ED
Glucose-Capillary: 111 mg/dL — ABNORMAL HIGH (ref 70–99)
Glucose-Capillary: 91 mg/dL (ref 70–99)
Glucose-Capillary: 66 mg/dL — ABNORMAL LOW (ref 70–99)
Glucose-Capillary: 89 mg/dL (ref 70–99)
Glucose-Capillary: 102 mg/dL — ABNORMAL HIGH (ref 70–99)
Glucose-Capillary: 88 mg/dL (ref 70–99)
Glucose-Capillary: 17 mg/dL — CL (ref 70–99)
Glucose-Capillary: 44 mg/dL — CL (ref 70–99)
Glucose-Capillary: 51 mg/dL — ABNORMAL LOW (ref 70–99)
Glucose-Capillary: 132 mg/dL — ABNORMAL HIGH (ref 70–99)

## 2023-11-27 LAB — APTT: aPTT: 34 s (ref 24–36)

## 2023-11-27 LAB — AMMONIA: Ammonia: 22 umol/L (ref 9–35)

## 2023-11-27 LAB — PROCALCITONIN: Procalcitonin: 0.49 ng/mL

## 2023-11-27 LAB — ETHANOL: Alcohol, Ethyl (B): <15 mg/dL (ref <15)

## 2023-11-27 LAB — BRAIN NATRIURETIC PEPTIDE: B Natriuretic Peptide: 36.6 pg/mL (ref 0.0–100.0)

## 2023-11-27 LAB — I-STAT CG4 LACTIC ACID, ED: Lactic Acid, Venous: 0.7 mmol/L (ref 0.5–1.9)

## 2023-11-27 LAB — D-DIMER, QUANTITATIVE: D-Dimer, Quant: 1.14 ug{FEU}/mL — ABNORMAL HIGH (ref 0.00–0.50)

## 2023-11-27 MED ORDER — BUPROPION HCL ER (SR) 150 MG PO TB12
150.0000 mg | ORAL_TABLET | Freq: Two times a day (BID) | ORAL | Status: DC
  Administered 2023-11-29 – 2023-12-04 (×7): 150 mg via ORAL
  Filled 2023-11-27 (×16): qty 1

## 2023-11-27 MED ORDER — DULOXETINE HCL 60 MG PO CPEP: 60.0000 mg | ORAL_CAPSULE | Freq: Every day | ORAL | Status: DC

## 2023-11-27 MED ORDER — CARVEDILOL 12.5 MG PO TABS
12.5000 mg | ORAL_TABLET | ORAL | Status: DC
  Filled 2023-11-27 (×4): qty 1

## 2023-11-27 MED ORDER — DEXTROSE 50 % IV SOLN
25.0000 g | Freq: Once | INTRAVENOUS | Status: AC
  Administered 2023-11-27: 25 g via INTRAVENOUS
  Filled 2023-11-27: qty 50

## 2023-11-27 MED ORDER — FUROSEMIDE 40 MG PO TABS
80.0000 mg | ORAL_TABLET | ORAL | Status: DC
  Administered 2023-11-29 – 2023-12-04 (×2): 80 mg via ORAL
  Filled 2023-11-27 (×3): qty 2

## 2023-11-27 MED ORDER — CHLORHEXIDINE GLUCONATE CLOTH 2 % EX PADS
6.0000 | MEDICATED_PAD | Freq: Every day | CUTANEOUS | Status: DC
Start: 2023-11-28 — End: 2023-12-05
  Administered 2023-11-29 – 2023-12-04 (×5): 6 via TOPICAL

## 2023-11-27 MED ORDER — MEMANTINE HCL 10 MG PO TABS
10.0000 mg | ORAL_TABLET | Freq: Two times a day (BID) | ORAL | Status: DC
  Administered 2023-11-29 – 2023-12-04 (×8): 10 mg via ORAL
  Filled 2023-11-27 (×12): qty 1

## 2023-11-27 MED ORDER — INSULIN ASPART 100 UNIT/ML IJ SOLN: 0.0000 [IU] | Freq: Three times a day (TID) | INTRAMUSCULAR | Status: DC

## 2023-11-27 MED ORDER — ALBUTEROL SULFATE (2.5 MG/3ML) 0.083% IN NEBU: 2.5000 mg | INHALATION_SOLUTION | RESPIRATORY_TRACT | Status: DC | PRN

## 2023-11-27 MED ORDER — ACETAMINOPHEN 650 MG RE SUPP: 650.0000 mg | Freq: Four times a day (QID) | RECTAL | Status: DC | PRN

## 2023-11-27 MED ORDER — SODIUM CHLORIDE 0.9 % IV SOLN
2.0000 g | Freq: Once | INTRAVENOUS | Status: AC
  Administered 2023-11-28: 2 g via INTRAVENOUS
  Filled 2023-11-27: qty 20

## 2023-11-27 MED ORDER — SODIUM CHLORIDE 0.9 % IV SOLN
2.0000 g | Freq: Once | INTRAVENOUS | Status: AC
  Administered 2023-11-27: 2 g via INTRAVENOUS
  Filled 2023-11-27: qty 20

## 2023-11-27 MED ORDER — SODIUM CHLORIDE 0.9% FLUSH
3.0000 mL | Freq: Two times a day (BID) | INTRAVENOUS | Status: DC
  Administered 2023-11-27 – 2023-12-04 (×8): 3 mL via INTRAVENOUS

## 2023-11-27 MED ORDER — ATORVASTATIN CALCIUM 80 MG PO TABS
80.0000 mg | ORAL_TABLET | Freq: Every day | ORAL | Status: DC
  Administered 2023-11-29 – 2023-12-03 (×4): 80 mg via ORAL
  Filled 2023-11-27 (×6): qty 1

## 2023-11-27 MED ORDER — DOXYCYCLINE HYCLATE 100 MG PO TABS
100.0000 mg | ORAL_TABLET | Freq: Two times a day (BID) | ORAL | Status: DC
  Administered 2023-11-28 – 2023-12-04 (×9): 100 mg via ORAL
  Filled 2023-11-27 (×14): qty 1

## 2023-11-27 MED ORDER — ASPIRIN 81 MG PO CHEW
81.0000 mg | CHEWABLE_TABLET | Freq: Every day | ORAL | Status: DC
  Administered 2023-11-28: 81 mg via ORAL
  Filled 2023-11-27: qty 1

## 2023-11-27 MED ORDER — ACETAMINOPHEN 325 MG PO TABS
650.0000 mg | ORAL_TABLET | Freq: Four times a day (QID) | ORAL | Status: DC | PRN
Start: 2023-11-27 — End: 2023-12-05
  Administered 2023-11-29 (×2): 650 mg via ORAL
  Filled 2023-11-27 (×2): qty 2

## 2023-11-27 MED ORDER — HEPARIN SODIUM (PORCINE) 5000 UNIT/ML IJ SOLN
5000.0000 [IU] | Freq: Three times a day (TID) | INTRAMUSCULAR | Status: DC
  Administered 2023-11-27 – 2023-11-28 (×2): 5000 [IU] via SUBCUTANEOUS
  Filled 2023-11-27 (×2): qty 1

## 2023-11-27 MED ORDER — INSULIN ASPART 100 UNIT/ML IJ SOLN: 0.0000 [IU] | Freq: Every day | INTRAMUSCULAR | Status: DC

## 2023-11-27 MED ORDER — LEVOTHYROXINE SODIUM 75 MCG PO TABS
175.0000 ug | ORAL_TABLET | Freq: Every day | ORAL | Status: DC
  Administered 2023-11-28 – 2023-12-04 (×5): 175 ug via ORAL
  Filled 2023-11-27 (×7): qty 1

## 2023-11-27 NOTE — Progress Notes (Signed)
 I'm been in this patient's chart because I was supposed to be getting  her from ED. A different patient shown up and I realized I have been switched from her care

## 2023-11-27 NOTE — ED Notes (Addendum)
 PT is eating dinner and was given 2 additional cups of oj. Will recheck CBG after dinner.

## 2023-11-27 NOTE — H&P (Addendum)
 History and Physical    Patient: Jennifer [name] UXL:[id number] DOB: Dec 06, 1955 DOA: 11/27/2023 DOS: the patient was seen and examined on 11/27/2023 PCP: Clemens Curt, MD  Patient coming from: [facility] skilled nursing via EMS  Chief Complaint:  Chief Complaint  Patient presents with   Respiratory Distress   HPI: Jennifer [name] is a 68 y.o. female with medical history significant of hypertension, CAD, CVA, diabetes mellitus type 2, ESRD on HD, hypothyroidism, anxiety, depression, and memory loss  altered mental status and hypoglycemia. She is accompanied by her daughter.   Records note patient had just recently been hospitalized 6/9 - 6/12 for acute left-sided weakness without clear signs of CVA on MRI or seizure by EEG.  During hospital stay patient was noted to have E. coli and Proteus in urine cultures and started on Rocephin  6/12 completing course prior to discharge.  During her stay she was also transition to a Monday, Wednesday, Friday schedule for hemodialysis.  She was brought to the hospital from [facility] rehab facility after last night, she was noted to be altered and unresponsive with a low blood sugar level in the fifties. Her oxygen  saturation was also low, in the 60s, and she was given a Duoneb treatment, which only increased her oxygen  saturation to 80-81. She was lethargic and not responding well, leading to a call to emergency services.  She has had issues with low blood sugars in the sixties and seventies previously, but she remained alert and awake during those episodes. It is unclear if she received insulin  the night before her recent episode.  There is also a concern about pain in her right knee following a fall, but no imaging has been done on her leg yet.  Patient was reported to have O2 saturations around 62% on 5 L via nonrebreather mask upon EMS arrival for which she was placed on 15 L via nonrebreather with improvement to 100%.  In the ED patient was noted to be  hypothermic with temperature 94.9 F with respirations 16-23, O2 saturations currently maintained on 6 L of high flow nasal cannula oxygen , and all other vital signs maintained.   Labs noted WBC 11.4, lactic acid 0.7, hemoglobin 9.6, potassium 4.6, BUN 45, creatinine 5.64, glucose dipping down to 68, and albumin 2.2.  Chest x-ray showed no acute abnormality.  ABG noted  pH 7.503, pCO2 38, PO2 61.  CT scan of the brain showed no acute intercranial abnormality and atrophy with chronic small vessel disease.  She was not a candidate for thrombolytics due to unknown last well. Blood cultures have been obtained.  Patient had been given empiric antibiotics of Rocephin  and 25 g of dextrose  50%.  MRI of the brain as well as urinalysis have been ordered.  Review of Systems: As mentioned in the history of present illness. All other systems reviewed and are negative. Past Medical History:  Diagnosis Date   CAD (coronary artery disease)    cath 5/23 100% dist RCA lesion treated with 2 overlapping Integrity Resolute DES ( 2.25x21mm, 2.25x36mm), 60% mid RCA treated medically, 99% OM2 not amenable to PCI, 70% D1 lesion, EF normal   Hypercholesteremia    Hypertension    Hypothyroidism    Kidney disease    Renal disorder    Stroke (HCC) 10/2018   Thyroid  disease    Type 2 diabetes mellitus (HCC) 10/08/2018   Past Surgical History:  Procedure Laterality Date   ABDOMINAL HYSTERECTOMY     AV FISTULA PLACEMENT Right 06/28/2022  Procedure: RIGHT ARM ARTERIOVENOUS (AV) FISTULA CREATION;  Surgeon: Mayo Speck, MD;  Location: [facility];  Service: Vascular;  Laterality: Right;   CARDIAC CATHETERIZATION N/A 11/03/2015   Procedure: Left Heart Cath and Coronary Angiography;  Surgeon: Arleen Lacer, MD;  Location: [facility] INVASIVE CV LAB;  Service: Cardiovascular;  Laterality: N/A;   CARDIAC CATHETERIZATION N/A 11/03/2015   Procedure: Coronary Stent Intervention;  Surgeon: Arleen Lacer, MD;  Location: [facility] INVASIVE CV LAB;   Service: Cardiovascular;  Laterality: N/A;   CARPAL TUNNEL RELEASE     CORONARY ANGIOPLASTY     1 stent   FISTULA SUPERFICIALIZATION Right 08/16/2022   Procedure: RIGHT ARTERIOVENOUS FISTULA SUPERFICIALIZATION;  Surgeon: Mayo Speck, MD;  Location: [facility];  Service: Vascular;  Laterality: Right;   TOTAL KNEE ARTHROPLASTY Right 04/02/2019   Procedure: TOTAL KNEE ARTHROPLASTY;  Surgeon: Darrin Emerald, MD;  Location: [facility];  Service: Orthopedics;  Laterality: Right;   Social History:  reports that she quit smoking about 2 years ago. Her smoking use included cigarettes. She has never used smokeless tobacco. She reports that she does not drink alcohol  and does not use drugs.  Allergies  Allergen Reactions   Metformin Diarrhea   Tenecteplase  Other (See Comments)    Unknown     Family History  Problem Relation Age of Onset   Heart disease Mother    Stroke Sister    Heart attack Brother    Breast cancer Neg Hx     Prior to Admission medications   Medication Sig Start Date End Date Taking? Authorizing Provider  aspirin  81 MG chewable tablet Chew 1 tablet (81 mg total) by mouth daily. 05/24/23   de Thayne Fine, Cortney E, NP  atorvastatin  (LIPITOR ) 80 MG tablet TAKE 1 TABLET BY MOUTH ONCE DAILY AT  6  IN  THE  EVENING 09/06/23   Mallipeddi, Vishnu P, MD  BD PEN NEEDLE NANO 2ND GEN 32G X 4 MM MISC Inject into the skin 3 (three) times daily. 07/22/21   [provider]  buPROPion  (WELLBUTRIN  SR) 150 MG 12 hr tablet Take 1 tablet by mouth twice daily 08/02/23   [name], Manley Seeds, MD  carvedilol  (COREG ) 12.5 MG tablet Take 1 tablet (12.5 mg total) by mouth daily. On non-dialysis days Patient taking differently: Take 12.5 mg by mouth at bedtime. On non-dialysis days 05/24/23   de Thayne Fine, Cortney E, NP  doxycycline  (MONODOX ) 100 MG capsule Take 100 mg by mouth 2 (two) times daily. For a month for eye infection    [provider]  DULoxetine  (CYMBALTA ) 60 MG capsule Take 1 capsule  by mouth once daily 07/19/23   [name], Manley Seeds, MD  ferrous sulfate  325 (65 FE) MG tablet Take 325 mg by mouth as needed (given when needed at dialysis).    [provider]  furosemide  (LASIX ) 80 MG tablet Take 80 mg by mouth daily. On non-dialysis days 10/27/22   [provider]  insulin  lispro protamine-lispro (HUMALOG  75/25 MIX) (75-25) 100 UNIT/ML SUSP injection Inject 10-25 Units into the skin 3 (three) times daily as needed (high bs). Inject 25 units with breakfast, 10 units at lunch, 25 units at dinner    [provider]  iron sucrose 100 mg in sodium chloride  0.9 % 100 mL Inject 100 mg into the vein as needed (iron-deficiency anemia). Venofer    [provider]  levothyroxine  (SYNTHROID ) 175 MCG tablet Take 175 mcg by mouth daily before breakfast.  [provider]  lidocaine -prilocaine  (EMLA ) cream Apply 1 Application topically 4 (four) times a week.    [provider]  memantine  (NAMENDA ) 10 MG tablet Take 1 tablet (10 mg total) by mouth 2 (two) times daily. 06/22/23   Sethi, Pramod S, MD  Methoxy PEG-Epoetin Beta (MIRCERA) 200 MCG/0.3ML SOSY Inject 200 mcg as directed as needed (low RBCs). Mircera    [provider]  nitroGLYCERIN  (NITROSTAT ) 0.4 MG SL tablet Place 0.4 mg under the tongue every 5 (five) minutes as needed for chest pain.    [provider]  Nutritional Supplements (FEEDING SUPPLEMENT, NEPRO CARB STEADY,) LIQD Take 237 mLs by mouth as needed (missed meal during dialysis.). 11/25/23   Mason Sole, Pratik D, DO  sevelamer  carbonate (RENVELA ) 0.8 g PACK packet Take 0.8 g by mouth 3 (three) times daily with meals.    [provider]  tobramycin  (TOBREX ) 0.3 % ophthalmic solution Place 1 drop into the right eye every 2 (two) hours.    [provider]    Physical Exam: Vitals:   11/27/23 0630 11/27/23 0641 11/27/23 0645 11/27/23 0700  BP: (!) 152/61  (!) 142/50 (!) 145/64  Pulse: 88 87 86 89  Resp:  (!) 23 17 16 19   Temp:      TempSrc:      SpO2: 100% 100% 100% 100%  Weight:      Height:       Constitutional: Elderly female currently in no acute distress Eyes: PERRL, lids and conjunctivae normal ENMT: Mucous membranes are moist .Normal dentition.  Neck: normal, supple Respiratory: clear to auscultation bilaterally, Cardiovascular: Regular rate and rhythm, no murmurs / rubs / gallops.  Lower extremity edema Abdomen: no tenderness, no masses palpated. Bowel sounds positive.  Musculoskeletal: no clubbing / cyanosis. No joint deformity upper and lower extremities. Good ROM, no contractures. Normal muscle tone.  Skin: no rashes, lesions, ulcers.  Neurologic: CN 2-12 grossly intact.  Patient able to move all extremities Psychiatric: Normal judgment and insight.  Lethargic but oriented to person and place.. Normal mood.   Data Reviewed:  EKG revealed normal sinus rhythm at 79 bpm with premature complexes.  Reviewed labs and imaging, and pertinent records as documented.  Assessment and Plan:  Acute respiratory failure with hypoxia Patient was noted to be in respiratory distress with O2 saturations reported in the 60s on 5 L with improvement after being placed on 15 L nonrebreather.  Chest x-ray without any acute abnormality.  Question possibility of fluid overload versus aspiration versus pulmonary embolism. - Admit to a medical telemetry bed - Continuous pulse oximetry with oxygen  to maintain O2 saturation greater than 92% - Check D-dimer (1.14) and procalcitonin(0.49) - Check CT angiogram of the chest - Continue empiric antibiotics of Rocephin  and doxycycline  which patient had already been on for infection - Breathing treatments as needed  ESRD on HD Patient currently on a Monday, Wednesday, Friday schedule and is due for dialysis today.  Labs noted potassium 4.6, CO2 28, BUN 45, creatinine 5.64. - Nephrology consulted on need of hemodialysis.  Acute metabolic  encephalopathy Patient noted to be acutely altered and was not responding to staff at the facility like normal.  Symptoms thought secondary to hypoglycemia versus possibility of infection.. - Neurochecks - Follow-up MRI of the brain  Diabetes mellitus type 2 with hypoglycemia due to insulin  Acute.  On admission glucose noted to drop down below 70.  Patient had been on a regimen of 75/25 insulin  patient had been on  patient had been given 25 g of dextrose  50% with improvement. - Hypoglycemic protocols - Hold home insulin  regimen - Amp of D50 as needed for low blood sugars - CBGs before every meal with very sensitive SSI - Adjust previous regimen when deemed medically appropriate  SIRS Patient was noted to be hypothermic with blood pressure as low as 94.9 F with tachypnea.  Blood cultures have been obtained.  Lactic acid was reassuring at 0.7.  Patient had been given empiric antibiotics of Rocephin . - Bear hug as needed - Follow-up blood cultures - Follow-up urinalysis - Check procalcitonin  Hypertension Initial blood pressures 111/48 to 152/61 - Continue metoprolol  and furosemide  as tolerated  Hypothyroidism TSH noted to be within normal limits at 1.353 when checked on 11/20/2023 - Continue levothyroxine   Normocytic anemia Chronic.  Hemoglobin noted to be 9.6 with normal MCV and MCH.  Baseline hemoglobin appears to range from 8 to 9 g/dL  History of CVA Hyperlipidemia - Continue aspirin  and atorvastatin   Memory loss - Delirium precautions - Continue Namenda   Anxiety  - Continue current medication regimen   DVT prophylaxis: Heparin  Advance Care Planning:   Code Status: Full Code   Consults: Nephrology  Family Communication: Daughter updated at bedside  Severity of Illness: The appropriate patient status for this patient is OBSERVATION. Observation status is judged to be reasonable and necessary in order to provide the required intensity of service to ensure the  patient's safety. The patient's presenting symptoms, physical exam findings, and initial radiographic and laboratory data in the context of their medical condition is felt to place them at decreased risk for further clinical deterioration. Furthermore, it is anticipated that the patient will be medically stable for discharge from the hospital within 2 midnights of admission.   Author: Lena Qualia, MD 11/27/2023 7:20 AM  For on call review [email or web address].

## 2023-11-27 NOTE — ED Notes (Signed)
 PT transported to dialysis with transport

## 2023-11-27 NOTE — Consult Note (Signed)
 Reason for Consult: To manage dialysis and dialysis related needs Referring Physician: Dr Harvy Linger Jennifer [name] is an 68 y.o. female.  HPI: Pt is a 45F with a PMH sig for HTN, HLD, ESRD on HD, and a recent admission for possible stroke who is now seen in consultation at the request of Dr. Felipe Horton for management of ESRD and provision of HD.  Discharged late last week from [facility] for stroke- like symptoms, imaging negative.  Discharged to [facility] for rehab,  Was doing HHD and had to be converted over to in-center HD.  First rx was supposed to be today.  However, found hypoxic/ unresponsive at SNF- sats in the 60s, placed on [place] O2 and then NRB,  Brought in to ED.    CXR negative, repeat CT head negative.  MRI showing questionable lacunar infarct.    Had a couple of issues with the buttonholes at [facility]  Dtr says she uses a sharp pretty often especially for the arterial needle.    Pt reports no SOB and not hurting anywhere.    Dialyzes at [facility]- first rx for today Previously was doing HHD 4x/ week  Past Medical History:  Diagnosis Date   CAD (coronary artery disease)    cath 5/23 100% dist RCA lesion treated with 2 overlapping Integrity Resolute DES ( 2.25x2mm, 2.25x52mm), 60% mid RCA treated medically, 99% OM2 not amenable to PCI, 70% D1 lesion, EF normal   Hypercholesteremia    Hypertension    Hypothyroidism    Kidney disease    Renal disorder    Stroke (HCC) 10/2018   Thyroid  disease    Type 2 diabetes mellitus (HCC) 10/08/2018    Past Surgical History:  Procedure Laterality Date   ABDOMINAL HYSTERECTOMY     AV FISTULA PLACEMENT Right 06/28/2022   Procedure: RIGHT ARM ARTERIOVENOUS (AV) FISTULA CREATION;  Surgeon: Mayo Speck, MD;  Location: [facility];  Service: Vascular;  Laterality: Right;   CARDIAC CATHETERIZATION N/A 11/03/2015   Procedure: Left Heart Cath and Coronary Angiography;  Surgeon: Arleen Lacer, MD;  Location: [facility] INVASIVE CV LAB;  Service: Cardiovascular;  Laterality:  N/A;   CARDIAC CATHETERIZATION N/A 11/03/2015   Procedure: Coronary Stent Intervention;  Surgeon: Arleen Lacer, MD;  Location: [facility] INVASIVE CV LAB;  Service: Cardiovascular;  Laterality: N/A;   CARPAL TUNNEL RELEASE     CORONARY ANGIOPLASTY     1 stent   FISTULA SUPERFICIALIZATION Right 08/16/2022   Procedure: RIGHT ARTERIOVENOUS FISTULA SUPERFICIALIZATION;  Surgeon: Mayo Speck, MD;  Location: [facility];  Service: Vascular;  Laterality: Right;   TOTAL KNEE ARTHROPLASTY Right 04/02/2019   Procedure: TOTAL KNEE ARTHROPLASTY;  Surgeon: Darrin Emerald, MD;  Location: [facility];  Service: Orthopedics;  Laterality: Right;    Family History  Problem Relation Age of Onset   Heart disease Mother    Stroke Sister    Heart attack Brother    Breast cancer Neg Hx     Social History:  reports that she quit smoking about 2 years ago. Her smoking use included cigarettes. She has never used smokeless tobacco. She reports that she does not drink alcohol  and does not use drugs.  Allergies:  Allergies  Allergen Reactions   Glucophage [Metformin] Diarrhea   Tnkase  [Tenecteplase ] Other (See Comments)    Unknown reaction   Medications: reviewed in Epic   Results for orders placed or performed during the hospital encounter of 11/27/23 (from the past 48 hours)  CBG monitoring,  ED     Status: Abnormal   Collection Time: 11/27/23  3:41 AM  Result Value Ref Range   Glucose-Capillary 111 (H) 70 - 99 mg/dL    Comment: Glucose reference range applies only to samples taken after fasting for at least 8 hours.  Ethanol     Status: None   Collection Time: 11/27/23  3:43 AM  Result Value Ref Range   Alcohol , Ethyl (B) <15 <15 mg/dL    Comment: (NOTE) For medical purposes only. Performed at [facility], [address], [place], [place] [address]   Protime-INR     Status: None   Collection Time: 11/27/23  3:43 AM  Result Value Ref Range   Prothrombin Time 13.9 11.4 - 15.2 seconds   INR 1.1 0.8 -  1.2    Comment: (NOTE) INR goal varies based on device and disease states. Performed at [facility], [address] [place], [place] [address]   APTT     Status: None   Collection Time: 11/27/23  3:43 AM  Result Value Ref Range   aPTT 34 24 - 36 seconds    Comment: Performed at [facility], [address] [place], [place] [address]  CBC     Status: Abnormal   Collection Time: 11/27/23  3:43 AM  Result Value Ref Range   WBC 11.4 (H) 4.0 - 10.5 K/uL   RBC 3.09 (L) 3.87 - 5.11 MIL/uL   Hemoglobin 9.6 (L) 12.0 - 15.0 g/dL   HCT 91.4 (L) 78.2 - 95.6 %   MCV 99.7 80.0 - 100.0 fL   MCH 31.1 26.0 - 34.0 pg   MCHC 31.2 30.0 - 36.0 g/dL   RDW 21.3 (H) 08.6 - 57.8 %   Platelets 266 150 - 400 K/uL   nRBC 0.0 0.0 - 0.2 %    Comment: Performed at [facility], [address] [place], [place] [address]  Differential     Status: Abnormal   Collection Time: 11/27/23  3:43 AM  Result Value Ref Range   Neutrophils Relative % 88 %   Neutro Abs 10.0 (H) 1.7 - 7.7 K/uL   Lymphocytes Relative 6 %   Lymphs Abs 0.7 0.7 - 4.0 K/uL   Monocytes Relative 5 %   Monocytes Absolute 0.6 0.1 - 1.0 K/uL   Eosinophils Relative 0 %   Eosinophils Absolute 0.0 0.0 - 0.5 K/uL   Basophils Relative 0 %   Basophils Absolute 0.1 0.0 - 0.1 K/uL   Immature Granulocytes 1 %   Abs Immature Granulocytes 0.10 (H) 0.00 - 0.07 K/uL    Comment: Performed at [facility], [address] [place], [place] [address]  Comprehensive metabolic panel     Status: Abnormal   Collection Time: 11/27/23  3:43 AM  Result Value Ref Range   Sodium 137 135 - 145 mmol/L   Potassium 4.6 3.5 - 5.1 mmol/L   Chloride 98 98 - 111 mmol/L   CO2 28 22 - 32 mmol/L   Glucose, Bld 109 (H) 70 - 99 mg/dL    Comment: Glucose reference range applies only to samples taken after fasting for at least 8 hours.   BUN 45 (H) 8 - 23 mg/dL   Creatinine, Ser 1.32 (H) 0.44 - 1.00 mg/dL   Calcium  8.6 (L) 8.9 - 10.3 mg/dL   Total  Protein 5.2 (L) 6.5 - 8.1 g/dL   Albumin 2.2 (L) 3.5 - 5.0 g/dL  AST 21 15 - 41 U/L   ALT 13 0 - 44 U/L   Alkaline Phosphatase 54 38 - 126 U/L   Total Bilirubin 0.6 0.0 - 1.2 mg/dL   GFR, Estimated 8 (L) >60 mL/min    Comment: (NOTE) Calculated using the CKD-EPI Creatinine Equation (2021)    Anion gap 11 5 - 15    Comment: Performed at [facility], [address], [place], [place] [address]  Procalcitonin     Status: None   Collection Time: 11/27/23  3:43 AM  Result Value Ref Range   Procalcitonin 0.49 ng/mL    Comment:        Interpretation: PCT (Procalcitonin) <= 0.5 ng/mL: Systemic infection (sepsis) is not likely. Local bacterial infection is possible. (NOTE)       Sepsis PCT Algorithm           Lower Respiratory Tract                                      Infection PCT Algorithm    ----------------------------     ----------------------------         PCT < 0.25 ng/mL                PCT < 0.10 ng/mL          Strongly encourage             Strongly discourage   discontinuation of antibiotics    initiation of antibiotics    ----------------------------     -----------------------------       PCT 0.25 - 0.50 ng/mL            PCT 0.10 - 0.25 ng/mL               OR       >80% decrease in PCT            Discourage initiation of                                            antibiotics      Encourage discontinuation           of antibiotics    ----------------------------     -----------------------------         PCT >= 0.50 ng/mL              PCT 0.26 - 0.50 ng/mL               AND        <80% decrease in PCT             Encourage initiation of                                             antibiotics       Encourage continuation           of antibiotics    ----------------------------     -----------------------------        PCT >= 0.50 ng/mL                  PCT > 0.50 ng/mL  AND         increase in PCT                  Strongly encourage                                       initiation of antibiotics    Strongly encourage escalation           of antibiotics                                     -----------------------------                                           PCT <= 0.25 ng/mL                                                 OR                                        > 80% decrease in PCT                                      Discontinue / Do not initiate                                             antibiotics  Performed at [facility], [address] [place], [place] [address]   Brain natriuretic peptide     Status: None   Collection Time: 11/27/23  3:43 AM  Result Value Ref Range   B Natriuretic Peptide 36.6 0.0 - 100.0 pg/mL    Comment: Performed at [facility], [address], [place], [place] [address]  I-stat chem 8, ED     Status: Abnormal   Collection Time: 11/27/23  3:58 AM  Result Value Ref Range   Sodium 137 135 - 145 mmol/L   Potassium 4.4 3.5 - 5.1 mmol/L   Chloride 96 (L) 98 - 111 mmol/L   BUN 45 (H) 8 - 23 mg/dL   Creatinine, Ser 1.32 (H) 0.44 - 1.00 mg/dL   Glucose, Bld 440 (H) 70 - 99 mg/dL    Comment: Glucose reference range applies only to samples taken after fasting for at least 8 hours.   Calcium , Ion 1.14 (L) 1.15 - 1.40 mmol/L   TCO2 29 22 - 32 mmol/L   Hemoglobin 9.9 (L) 12.0 - 15.0 g/dL   HCT 10.2 (L) 72.5 - 36.6 %  I-Stat arterial blood gas, ED     Status: Abnormal   Collection Time: 11/27/23  3:59 AM  Result Value Ref Range   pH, Arterial 7.503 (H) 7.35 - 7.45   pCO2 arterial 38.0 32 - 48 mmHg   pO2, Arterial 61 (L)  83 - 108 mmHg   Bicarbonate 30.4 (H) 20.0 - 28.0 mmol/L   TCO2 32 22 - 32 mmol/L   O2 Saturation 95 %   Acid-Base Excess 6.0 (H) 0.0 - 2.0 mmol/L   Sodium 135 135 - 145 mmol/L   Potassium 4.4 3.5 - 5.1 mmol/L   Calcium , Ion 1.20 1.15 - 1.40 mmol/L   HCT 25.0 (L) 36.0 - 46.0 %   Hemoglobin 8.5 (L) 12.0 - 15.0 g/dL   Patient temperature 29.5 F    Sample type ARTERIAL    I-Stat Lactic Acid     Status: None   Collection Time: 11/27/23  3:59 AM  Result Value Ref Range   Lactic Acid, Venous 0.7 0.5 - 1.9 mmol/L  Culture, blood (routine x 2)     Status: None (Preliminary result)   Collection Time: 11/27/23  4:04 AM   Specimen: BLOOD  Result Value Ref Range   Specimen Description BLOOD BLOOD LEFT HAND    Special Requests      BOTTLES DRAWN AEROBIC ONLY Blood Culture adequate volume   Culture      NO GROWTH < 12 HOURS Performed at [facility], [address], [place], [place] [address]    Report Status PENDING   CBG monitoring, ED     Status: None   Collection Time: 11/27/23  4:48 AM  Result Value Ref Range   Glucose-Capillary 91 70 - 99 mg/dL    Comment: Glucose reference range applies only to samples taken after fasting for at least 8 hours.  Ammonia     Status: None   Collection Time: 11/27/23  5:02 AM  Result Value Ref Range   Ammonia 22 9 - 35 umol/L    Comment: Performed at [facility], [address] [place], [place] [address]  Culture, blood (routine x 2)     Status: None (Preliminary result)   Collection Time: 11/27/23  5:02 AM   Specimen: BLOOD  Result Value Ref Range   Specimen Description BLOOD BLOOD LEFT HAND AEROBIC BOTTLE ONLY    Special Requests      BOTTLES DRAWN AEROBIC ONLY Blood Culture adequate volume   Culture      NO GROWTH < 12 HOURS Performed at [facility], [address] [place], [place] [address]    Report Status PENDING   CBG monitoring, ED     Status: Abnormal   Collection Time: 11/27/23  5:48 AM  Result Value Ref Range   Glucose-Capillary 66 (L) 70 - 99 mg/dL    Comment: Glucose reference range applies only to samples taken after fasting for at least 8 hours.  CBG monitoring, ED     Status: None   Collection Time: 11/27/23  6:30 AM  Result Value Ref Range   Glucose-Capillary 89 70 - 99 mg/dL    Comment: Glucose reference range applies only to samples taken after fasting for at least 8  hours.  D-dimer, quantitative     Status: Abnormal   Collection Time: 11/27/23  7:39 AM  Result Value Ref Range   D-Dimer, Quant 1.14 (H) 0.00 - 0.50 ug/mL-FEU    Comment: (NOTE) At the manufacturer cut-off value of 0.5 g/mL FEU, this assay has a negative predictive value of 95-100%.This assay is intended for use in conjunction with a clinical pretest probability (PTP) assessment model to exclude pulmonary embolism (PE) and deep venous thrombosis (DVT) in outpatients suspected of PE or DVT. Results should be correlated with  clinical presentation. Performed at [facility], [address], [place], [place] [address]   CBG monitoring, ED     Status: Abnormal   Collection Time: 11/27/23  8:23 AM  Result Value Ref Range   Glucose-Capillary 102 (H) 70 - 99 mg/dL    Comment: Glucose reference range applies only to samples taken after fasting for at least 8 hours.  CBG monitoring, ED     Status: None   Collection Time: 11/27/23 10:11 AM  Result Value Ref Range   Glucose-Capillary 88 70 - 99 mg/dL    Comment: Glucose reference range applies only to samples taken after fasting for at least 8 hours.    MR BRAIN WO CONTRAST Result Date: 11/27/2023 CLINICAL DATA:  Mental status change, unknown cause Neuro deficit, acute, stroke suspected EXAM: MRI HEAD WITHOUT CONTRAST TECHNIQUE: Multiplanar, multiecho pulse sequences of the brain and surrounding structures were obtained without intravenous contrast. COMPARISON:  CT of the head dated November 27, 2023. FINDINGS: Brain: There is a punctate focus of high diffusion signal present along the medial surface of the anterior horn of the left lateral ventricle seen on image 27 of series 2. The diffusion-weighted images are otherwise unremarkable. There is global cerebral volume loss, which is advanced for the patient's age. There is also extensive diffuse cerebral white matter disease. Chronic encephalomalacia changes are noted within the left cerebellar  hemisphere. There is no evidence of acute hemorrhage, mass or hydrocephalus. There are numerous blooming artifacts present within the brainstem, periventricular white matter, mesial temporal lobes and the cerebellum. Vascular: Normal flow voids. Skull and upper cervical spine: Normal bone marrow signal. Sinuses/Orbits: Status post bilateral lens replacement. The paranasal sinuses are clear. Other: None. IMPRESSION: 1. Questionable punctate acute lacunar infarct involving the left periventricular white matter. 2. Extensive cerebral white matter disease and chronic encephalomalacia changes within the left cerebellar hemisphere. 3. Numerous foci of hemosiderin staining primarily within the brainstem, periventricular white matter and mesial temporal lobes. Electronically Signed   By: Maribeth Shivers M.D.   On: 11/27/2023 13:56   CT HEAD WO CONTRAST Result Date: 11/27/2023 CLINICAL DATA:  Mental status changes. EXAM: CT HEAD WITHOUT CONTRAST TECHNIQUE: Contiguous axial images were obtained from the base of the skull through the vertex without intravenous contrast. RADIATION DOSE REDUCTION: This exam was performed according to the departmental dose-optimization program which includes automated exposure control, adjustment of the mA and/or kV according to patient size and/or use of iterative reconstruction technique. COMPARISON:  Brain MRI 11/21/2023.  Head CT 11/20/2023 FINDINGS: Brain: There is no evidence for acute hemorrhage, hydrocephalus, mass lesion, or abnormal extra-axial fluid collection. No definite CT evidence for acute infarction. Diffuse loss of parenchymal volume is consistent with atrophy. Patchy low attenuation in the deep hemispheric and periventricular white matter is nonspecific, but likely reflects chronic microvascular ischemic demyelination. 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 Vascular: No hyperdense vessel or unexpected calcification. Skull: No evidence for fracture. No worrisome lytic or  sclerotic lesion. Sinuses/Orbits: The visualized paranasal sinuses and mastoid air cells are clear. Visualized portions of the globes and intraorbital fat are unremarkable. Other: None. IMPRESSION: 1. No acute intracranial abnormality. 2. Atrophy with chronic small vessel ischemic disease. Electronically Signed   By: Donnal Fusi M.D.   On: 11/27/2023 06:09   DG Chest Port 1 View Result Date: 11/27/2023 CLINICAL DATA:  68 year old female with altered mental status and hypoxia. EXAM: PORTABLE CHEST 1 VIEW COMPARISON:  Portable chest 05/20/2023 and earlier. FINDINGS: Portable AP  semi upright view at 0355 hours. Calcified aortic atherosclerosis. Other mediastinal contours are within normal limits. Lung volumes are within normal limits. Visualized tracheal air column is within normal limits. Allowing for portable technique the lungs are clear. No pneumothorax or pleural effusion. No acute osseous abnormality identified. Negative visible bowel gas. IMPRESSION: No acute cardiopulmonary abnormality. Aortic Atherosclerosis (ICD10-I70.0). Electronically Signed   By: Marlise Simpers M.D.   On: 11/27/2023 04:09    ROS: all oather systems reviewed and are negative except as per HPI Blood pressure (!) 120/53, pulse 100, temperature 98.1 F (36.7 C), temperature source Oral, resp. rate 19, height 5' 7 (1.702 m), weight 91.2 kg, SpO2 100%. GEN nad, lying in bed HEENT EOMI PERRL NECK + JVD PULM clear anteriorly CV RRR ABD soft EXT 1+ anasarca NEURO AAO x 3 SKIN warm and dry ACCESS:  R AVF + T/b  Assessment/Plan: 1 Acute hypoxic RF: CXR negative.  ? Aspiration vs edema-- but would expect to see on CXR.  Regardless- she does look a little volume overloaded.  Getting CTA for eval of PE.  Will go ahead and order HD for today 2 ESRD: MWF for now at [facility]- will do 4 hr rx with UF first and then HD.  Will need to use butthonholes. 3 Hypertension:  at her normal Bps now 4. Anemia of ESRD:  will dose mircera when  indicated 5. Metabolic Bone Disease:  binders/ vits when eating 6.  Dispo admitted  Leandra Pro 11/27/2023, 2:37 PM

## 2023-11-27 NOTE — ED Notes (Signed)
 Bear hugger applied. EDP aware.

## 2023-11-27 NOTE — ED Triage Notes (Signed)
 Pt BIB EMS fro [facility] with c/o respiratory distress. O2 was 62% on 5 lpm NRB upon ems arrival, O2 increased to 15lpm NRB by EMS. O2 sat 100% upon arrival. Pt responsive to verbal stimuli.

## 2023-11-27 NOTE — ED Notes (Signed)
 IV team unable to place IV , physician notified.

## 2023-11-27 NOTE — ED Notes (Signed)
 Checked patient cbg it was 54 notified RN of blood sugar

## 2023-11-27 NOTE — Progress Notes (Signed)
 VAST consult for PIV placement. Assessed L arm with US  for PIV placement. No appropriate vein found at this time. R arm restricted. Not appropriate for PICC or midline d/t renal function. Notified patient's RN. Theophilus Fitz, RN VAST

## 2023-11-27 NOTE — ED Provider Notes (Signed)
  [place] EMERGENCY DEPARTMENT AT [facility] Provider Note      Procedures .Ultrasound ED Peripheral IV (Provider)  Date/Time: 11/27/2023 3:54 AM  Performed by: Sherel Dikes, PA-C Authorized by: Sherel Dikes, PA-C   Procedure details:    Indications: multiple failed IV attempts     Skin Prep: chlorhexidine  gluconate     Location: left upper arm.   Angiocath:  20 G   Bedside Ultrasound Guided: Yes     Images: archived     Patient tolerated procedure without complications: Yes     Dressing applied: Yes        Sherel Dikes, PA-C 11/27/23 0354    Kelsey Patricia, MD 11/27/23 [phone]

## 2023-11-27 NOTE — ED Notes (Signed)
 PT had a CBG of 44,PT given 2 oj's will recheck.

## 2023-11-27 NOTE — ED Provider Notes (Signed)
 [facility] EMERGENCY DEPARTMENT AT [facility] Provider Note   CSN: [id number] Arrival date & time: 11/27/23  0341     Patient presents with: Respiratory Distress   Jennifer [name] is a 68 y.o. female.   The history is provided by the EMS personnel and medical records.  Jennifer [name] is a 68 y.o. female who presents to the Emergency Department complaining of respiratory distress.  Level 5 caveat due to altered mental status.  She presents to the emergency department by EMS from [facility] skilled nursing facility for evaluation of respiratory distress.  Unclear when these symptoms arose.  They report difficulty breathing with sats at 65% on room air and she was placed on 5 L nonrebreather for respiratory support.  She does have decreased responsiveness as well, unclear when this began.  Of note she was recently admitted to the hospital for acute left-sided weakness was evaluated for CVA and seizure.  She was started on antibiotics for likely UTI.     Prior to Admission medications   Medication Sig Start Date End Date Taking? Authorizing Provider  aspirin  81 MG chewable tablet Chew 1 tablet (81 mg total) by mouth daily. 05/24/23   de Thayne Fine, Cortney E, NP  atorvastatin  (LIPITOR ) 80 MG tablet TAKE 1 TABLET BY MOUTH ONCE DAILY AT  6  IN  THE  EVENING 09/06/23   Mallipeddi, Vishnu P, MD  BD PEN NEEDLE NANO 2ND GEN 32G X 4 MM MISC Inject into the skin 3 (three) times daily. 07/22/21   [provider]  buPROPion  (WELLBUTRIN  SR) 150 MG 12 hr tablet Take 1 tablet by mouth twice daily 08/02/23   [name], Manley Seeds, MD  carvedilol  (COREG ) 12.5 MG tablet Take 1 tablet (12.5 mg total) by mouth daily. On non-dialysis days Patient taking differently: Take 12.5 mg by mouth at bedtime. On non-dialysis days 05/24/23   de Thayne Fine, Cortney E, NP  doxycycline  (MONODOX ) 100 MG capsule Take 100 mg by mouth 2 (two) times daily. For a month for eye infection    [provider]  DULoxetine   (CYMBALTA ) 60 MG capsule Take 1 capsule by mouth once daily 07/19/23   [name], Manley Seeds, MD  ferrous sulfate  325 (65 FE) MG tablet Take 325 mg by mouth as needed (given when needed at dialysis).    [provider]  furosemide  (LASIX ) 80 MG tablet Take 80 mg by mouth daily. On non-dialysis days 10/27/22   [provider]  insulin  lispro protamine-lispro (HUMALOG  75/25 MIX) (75-25) 100 UNIT/ML SUSP injection Inject 10-25 Units into the skin 3 (three) times daily as needed (high bs). Inject 25 units with breakfast, 10 units at lunch, 25 units at dinner    [provider]  iron sucrose 100 mg in sodium chloride  0.9 % 100 mL Inject 100 mg into the vein as needed (iron-deficiency anemia). Venofer    [provider]  levothyroxine  (SYNTHROID ) 175 MCG tablet Take 175 mcg by mouth daily before breakfast.    [provider]  lidocaine -prilocaine  (EMLA ) cream Apply 1 Application topically 4 (four) times a week.    [provider]  memantine  (NAMENDA ) 10 MG tablet Take 1 tablet (10 mg total) by mouth 2 (two) times daily. 06/22/23   Sethi, Pramod S, MD  Methoxy PEG-Epoetin Beta (MIRCERA) 200 MCG/0.3ML SOSY Inject 200 mcg as directed as needed (low RBCs). Mircera    [provider]  nitroGLYCERIN  (NITROSTAT ) 0.4 MG SL tablet Place 0.4 mg under the  tongue every 5 (five) minutes as needed for chest pain.    [provider]  Nutritional Supplements (FEEDING SUPPLEMENT, NEPRO CARB STEADY,) LIQD Take 237 mLs by mouth as needed (missed meal during dialysis.). 11/25/23   Mason Sole, Pratik D, DO  sevelamer  carbonate (RENVELA ) 0.8 g PACK packet Take 0.8 g by mouth 3 (three) times daily with meals.    [provider]  tobramycin  (TOBREX ) 0.3 % ophthalmic solution Place 1 drop into the right eye every 2 (two) hours.    [provider]    Allergies: Metformin and Tenecteplase     Review of Systems  All other systems reviewed and are  negative.   Updated Vital Signs BP (!) 152/61   Pulse 87   Temp (!) 94.9 F (34.9 C) (Rectal)   Resp 17   Ht 5' 7 (1.702 m)   Wt 91.2 kg   SpO2 100%   BMI 31.49 kg/m   Physical Exam Vitals and nursing note reviewed.  Constitutional:      Appearance: She is well-developed.     Comments: Lethargic.  Awakens to verbal stimuli.  HENT:     Head: Normocephalic and atraumatic.   Cardiovascular:     Rate and Rhythm: Normal rate and regular rhythm.     Heart sounds: No murmur heard. Pulmonary:     Effort: Pulmonary effort is normal. No respiratory distress.     Breath sounds: Normal breath sounds.  Abdominal:     Palpations: Abdomen is soft.     Tenderness: There is no abdominal tenderness. There is no guarding or rebound.   Musculoskeletal:        General: No tenderness.     Comments: Trace edema to bilateral lower extremities.   Skin:    General: Skin is warm and dry.   Neurological:     Comments: Nonverbal.  Right eye has chronic scarring, pupil midsize and nonreactive.  Left eye is midsize and reactive.  She prefers to gaze to the right but can cross midline to gaze to the left.  She does have weakness to the left upper and lower extremity when compared to the right, greatest in the left upper extremity.  She does move all 4 extremities.  Unable to assess visual fields.  Psychiatric:        Behavior: Behavior normal.     (all labs ordered are listed, but only abnormal results are displayed) Labs Reviewed  CBC - Abnormal; Notable for the following components:      Result Value   WBC 11.4 (*)    RBC 3.09 (*)    Hemoglobin 9.6 (*)    HCT 30.8 (*)    RDW 15.9 (*)    All other components within normal limits  DIFFERENTIAL - Abnormal; Notable for the following components:   Neutro Abs 10.0 (*)    Abs Immature Granulocytes 0.10 (*)    All other components within normal limits  COMPREHENSIVE METABOLIC PANEL WITH GFR - Abnormal; Notable for the following components:    Glucose, Bld 109 (*)    BUN 45 (*)    Creatinine, Ser 5.64 (*)    Calcium  8.6 (*)    Total Protein 5.2 (*)    Albumin 2.2 (*)    GFR, Estimated 8 (*)    All other components within normal limits  I-STAT CHEM 8, ED - Abnormal; Notable for the following components:   Chloride 96 (*)    BUN 45 (*)    Creatinine, Ser 6.00 (*)  Glucose, Bld 108 (*)    Calcium , Ion 1.14 (*)    Hemoglobin 9.9 (*)    HCT 29.0 (*)    All other components within normal limits  I-STAT ARTERIAL BLOOD GAS, ED - Abnormal; Notable for the following components:   pH, Arterial 7.503 (*)    pO2, Arterial 61 (*)    Bicarbonate 30.4 (*)    Acid-Base Excess 6.0 (*)    HCT 25.0 (*)    Hemoglobin 8.5 (*)    All other components within normal limits  CBG MONITORING, ED - Abnormal; Notable for the following components:   Glucose-Capillary 111 (*)    All other components within normal limits  CBG MONITORING, ED - Abnormal; Notable for the following components:   Glucose-Capillary 66 (*)    All other components within normal limits  CULTURE, BLOOD (ROUTINE X 2)  CULTURE, BLOOD (ROUTINE X 2)  ETHANOL  PROTIME-INR  APTT  AMMONIA  RAPID URINE DRUG SCREEN, HOSP PERFORMED  URINALYSIS, ROUTINE W REFLEX MICROSCOPIC  I-STAT CG4 LACTIC ACID, ED  CBG MONITORING, ED  CBG MONITORING, ED    EKG: EKG Interpretation Date/Time:  Monday November 27 2023 03:43:01 EDT Ventricular Rate:  79 PR Interval:  178 QRS Duration:  105 QT Interval:  404 QTC Calculation: 464 R Axis:   65  Text Interpretation: Sinus rhythm Multiple premature complexes, vent & supraven Confirmed by Kelsey Patricia [phone]) on 11/27/2023 3:57:46 AM  Radiology: CT HEAD WO CONTRAST Result Date: 11/27/2023 CLINICAL DATA:  Mental status changes. EXAM: CT HEAD WITHOUT CONTRAST TECHNIQUE: Contiguous axial images were obtained from the base of the skull through the vertex without intravenous contrast. RADIATION DOSE REDUCTION: This exam was performed according  to the departmental dose-optimization program which includes automated exposure control, adjustment of the mA and/or kV according to patient size and/or use of iterative reconstruction technique. COMPARISON:  Brain MRI 11/21/2023.  Head CT 11/20/2023 FINDINGS: Brain: There is no evidence for acute hemorrhage, hydrocephalus, mass lesion, or abnormal extra-axial fluid collection. No definite CT evidence for acute infarction. Diffuse loss of parenchymal volume is consistent with atrophy. Patchy low attenuation in the deep hemispheric and periventricular white matter is nonspecific, but likely reflects chronic microvascular ischemic demyelination. 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 Vascular: No hyperdense vessel or unexpected calcification. Skull: No evidence for fracture. No worrisome lytic or sclerotic lesion. Sinuses/Orbits: The visualized paranasal sinuses and mastoid air cells are clear. Visualized portions of the globes and intraorbital fat are unremarkable. Other: None. IMPRESSION: 1. No acute intracranial abnormality. 2. Atrophy with chronic small vessel ischemic disease. Electronically Signed   By: Donnal Fusi M.D.   On: 11/27/2023 06:09   DG Chest Port 1 View Result Date: 11/27/2023 CLINICAL DATA:  68 year old female with altered mental status and hypoxia. EXAM: PORTABLE CHEST 1 VIEW COMPARISON:  Portable chest 05/20/2023 and earlier. FINDINGS: Portable AP semi upright view at 0355 hours. Calcified aortic atherosclerosis. Other mediastinal contours are within normal limits. Lung volumes are within normal limits. Visualized tracheal air column is within normal limits. Allowing for portable technique the lungs are clear. No pneumothorax or pleural effusion. No acute osseous abnormality identified. Negative visible bowel gas. IMPRESSION: No acute cardiopulmonary abnormality. Aortic Atherosclerosis (ICD10-I70.0). Electronically Signed   By: Marlise Simpers M.D.   On: 11/27/2023 04:09     Procedures   CRITICAL CARE Performed by: Kelsey Patricia   Total critical care time: 45 minutes  Critical care time was exclusive of separately billable procedures and treating  other patients.  Critical care was necessary to treat or prevent imminent or life-threatening deterioration.  Critical care was time spent personally by me on the following activities: development of treatment plan with patient and/or surrogate as well as nursing, discussions with consultants, evaluation of patient's response to treatment, examination of patient, obtaining history from patient or surrogate, ordering and performing treatments and interventions, ordering and review of laboratory studies, ordering and review of radiographic studies, pulse oximetry and re-evaluation of patient's condition.  Medications Ordered in the ED  cefTRIAXone  (ROCEPHIN ) 2 g in sodium chloride  0.9 % 100 mL IVPB (0 g Intravenous Stopped 11/27/23 0544)  dextrose  50 % solution 25 g (25 g Intravenous Given 11/27/23 0551)                                    Medical Decision Making Amount and/or Complexity of Data Reviewed Labs: ordered. Radiology: ordered.  Risk Prescription drug management.   Patient with history of ESRD on hemodialysis, CVA, CHF, diabetes here for evaluation of altered mental status and respiratory distress.  On evaluation patient is significantly altered with left-sided deficits.  On record review she has history of same on multiple presentations, most recently on June 9 when she was admitted.  Code stroke not activated due to duration of symptoms being unknown.  Daughter is available for additional history after patient's initial ED presentation.  Daughter does report hypoglycemia at the facility earlier in the day with sugars down to 58.  This was treated with oral juice.  She does have occasional crackles in the right lung base on examination, mildly increased oxygen  requirement but normal work of breathing.  She was started  on antibiotics for possible sepsis given recent hospitalization with UTI.  Will send lactate and cultures.  Lactate is within normal limits.  Chest x-ray is negative for acute abnormality.  CMP with stable renal function.  CBC with mild leukocytosis.  Patient was hypothermic and she was provided a Lawyer for hypothermia.  Patient's mental status did slowly improve during her ED stay.  Patient more awake, slightly conversant with her daughter but continues to be confused compared to baseline.  Plan to admit to medicine service for ongoing workup.  Hospitalist consulted for admission.  Daughter updated [facility] Cr[facility] studies and recommendation for admission and daughter is in agreement with treatment plan.     Final diagnoses:  Stupor  Hypoglycemia  Hypothermia, initial encounter    ED Discharge Orders     None          Kelsey Patricia, MD 11/27/23 [phone]

## 2023-11-27 NOTE — ED Notes (Signed)
 Provider at bedside

## 2023-11-27 NOTE — ED Notes (Signed)
IV team bedside. 

## 2023-11-27 NOTE — ED Notes (Signed)
 CCMD called.

## 2023-11-28 ENCOUNTER — Observation Stay (HOSPITAL_COMMUNITY)

## 2023-11-28 ENCOUNTER — Other Ambulatory Visit (HOSPITAL_COMMUNITY): Payer: Self-pay

## 2023-11-28 ENCOUNTER — Telehealth (HOSPITAL_COMMUNITY): Payer: Self-pay | Admitting: Pharmacy Technician

## 2023-11-28 DIAGNOSIS — R29711 NIHSS score 11: Secondary | ICD-10-CM

## 2023-11-28 DIAGNOSIS — I639 Cerebral infarction, unspecified: Secondary | ICD-10-CM | POA: Diagnosis not present

## 2023-11-28 DIAGNOSIS — I2699 Other pulmonary embolism without acute cor pulmonale: Secondary | ICD-10-CM | POA: Diagnosis present

## 2023-11-28 DIAGNOSIS — D631 Anemia in chronic kidney disease: Secondary | ICD-10-CM | POA: Diagnosis present

## 2023-11-28 DIAGNOSIS — J159 Unspecified bacterial pneumonia: Secondary | ICD-10-CM | POA: Diagnosis present

## 2023-11-28 DIAGNOSIS — G9341 Metabolic encephalopathy: Secondary | ICD-10-CM | POA: Diagnosis present

## 2023-11-28 DIAGNOSIS — R1310 Dysphagia, unspecified: Secondary | ICD-10-CM | POA: Diagnosis present

## 2023-11-28 DIAGNOSIS — Z86711 Personal history of pulmonary embolism: Secondary | ICD-10-CM | POA: Diagnosis present

## 2023-11-28 DIAGNOSIS — E1122 Type 2 diabetes mellitus with diabetic chronic kidney disease: Secondary | ICD-10-CM | POA: Diagnosis present

## 2023-11-28 DIAGNOSIS — E7800 Pure hypercholesterolemia, unspecified: Secondary | ICD-10-CM | POA: Diagnosis present

## 2023-11-28 DIAGNOSIS — Z7901 Long term (current) use of anticoagulants: Secondary | ICD-10-CM | POA: Diagnosis not present

## 2023-11-28 DIAGNOSIS — R0602 Shortness of breath: Secondary | ICD-10-CM | POA: Diagnosis present

## 2023-11-28 DIAGNOSIS — I6381 Other cerebral infarction due to occlusion or stenosis of small artery: Secondary | ICD-10-CM | POA: Diagnosis present

## 2023-11-28 DIAGNOSIS — I120 Hypertensive chronic kidney disease with stage 5 chronic kidney disease or end stage renal disease: Secondary | ICD-10-CM | POA: Diagnosis present

## 2023-11-28 DIAGNOSIS — I69391 Dysphagia following cerebral infarction: Secondary | ICD-10-CM | POA: Diagnosis not present

## 2023-11-28 DIAGNOSIS — I2510 Atherosclerotic heart disease of native coronary artery without angina pectoris: Secondary | ICD-10-CM | POA: Diagnosis present

## 2023-11-28 DIAGNOSIS — F32A Depression, unspecified: Secondary | ICD-10-CM | POA: Diagnosis present

## 2023-11-28 DIAGNOSIS — R401 Stupor: Secondary | ICD-10-CM | POA: Diagnosis not present

## 2023-11-28 DIAGNOSIS — Z992 Dependence on renal dialysis: Secondary | ICD-10-CM | POA: Diagnosis not present

## 2023-11-28 DIAGNOSIS — R680 Hypothermia, not associated with low environmental temperature: Secondary | ICD-10-CM | POA: Diagnosis present

## 2023-11-28 DIAGNOSIS — E11649 Type 2 diabetes mellitus with hypoglycemia without coma: Secondary | ICD-10-CM | POA: Diagnosis present

## 2023-11-28 DIAGNOSIS — Z8249 Family history of ischemic heart disease and other diseases of the circulatory system: Secondary | ICD-10-CM | POA: Diagnosis not present

## 2023-11-28 DIAGNOSIS — J9601 Acute respiratory failure with hypoxia: Secondary | ICD-10-CM | POA: Diagnosis present

## 2023-11-28 DIAGNOSIS — F0154 Vascular dementia, unspecified severity, with anxiety: Secondary | ICD-10-CM | POA: Diagnosis present

## 2023-11-28 DIAGNOSIS — F0150 Vascular dementia without behavioral disturbance: Secondary | ICD-10-CM | POA: Diagnosis not present

## 2023-11-28 DIAGNOSIS — A419 Sepsis, unspecified organism: Secondary | ICD-10-CM | POA: Diagnosis present

## 2023-11-28 DIAGNOSIS — N186 End stage renal disease: Secondary | ICD-10-CM | POA: Diagnosis present

## 2023-11-28 DIAGNOSIS — F0153 Vascular dementia, unspecified severity, with mood disturbance: Secondary | ICD-10-CM | POA: Diagnosis present

## 2023-11-28 DIAGNOSIS — I2609 Other pulmonary embolism with acute cor pulmonale: Secondary | ICD-10-CM

## 2023-11-28 DIAGNOSIS — E1151 Type 2 diabetes mellitus with diabetic peripheral angiopathy without gangrene: Secondary | ICD-10-CM | POA: Diagnosis not present

## 2023-11-28 DIAGNOSIS — E039 Hypothyroidism, unspecified: Secondary | ICD-10-CM | POA: Diagnosis present

## 2023-11-28 DIAGNOSIS — Z79890 Hormone replacement therapy (postmenopausal): Secondary | ICD-10-CM | POA: Diagnosis not present

## 2023-11-28 DIAGNOSIS — Z6831 Body mass index (BMI) 31.0-31.9, adult: Secondary | ICD-10-CM | POA: Diagnosis not present

## 2023-11-28 LAB — RENAL FUNCTION PANEL
Albumin: 2.3 g/dL — ABNORMAL LOW (ref 3.5–5.0)
Anion gap: 14 (ref 5–15)
BUN: 30 mg/dL — ABNORMAL HIGH (ref 8–23)
CO2: 27 mmol/L (ref 22–32)
Calcium: 8.5 mg/dL — ABNORMAL LOW (ref 8.9–10.3)
Chloride: 95 mmol/L — ABNORMAL LOW (ref 98–111)
Creatinine, Ser: 4.59 mg/dL — ABNORMAL HIGH (ref 0.44–1.00)
GFR, Estimated: 10 mL/min — ABNORMAL LOW (ref >60)
Glucose, Bld: 153 mg/dL — ABNORMAL HIGH (ref 70–99)
Phosphorus: 3.6 mg/dL (ref 2.5–4.6)
Potassium: 4.1 mmol/L (ref 3.5–5.1)
Sodium: 136 mmol/L (ref 135–145)

## 2023-11-28 LAB — CBC
HCT: 24.2 % — ABNORMAL LOW (ref 36.0–46.0)
Hemoglobin: 7.5 g/dL — ABNORMAL LOW (ref 12.0–15.0)
MCH: 31.3 pg (ref 26.0–34.0)
MCHC: 31 g/dL (ref 30.0–36.0)
MCV: 100.8 fL — ABNORMAL HIGH (ref 80.0–100.0)
Platelets: 253 K/uL (ref 150–400)
RBC: 2.4 MIL/uL — ABNORMAL LOW (ref 3.87–5.11)
RDW: 15.9 % — ABNORMAL HIGH (ref 11.5–15.5)
WBC: 11.4 K/uL — ABNORMAL HIGH (ref 4.0–10.5)
nRBC: 0 % (ref 0.0–0.2)

## 2023-11-28 LAB — ECHOCARDIOGRAM LIMITED
Height: 67 in
S' Lateral: 4 cm
Weight: 3216.95 [oz_av]

## 2023-11-28 LAB — BRAIN NATRIURETIC PEPTIDE: B Natriuretic Peptide: 50.2 pg/mL (ref 0.0–100.0)

## 2023-11-28 LAB — HEPARIN LEVEL (UNFRACTIONATED): Heparin Unfractionated: >1.1 [IU]/mL — ABNORMAL HIGH (ref 0.30–0.70)

## 2023-11-28 LAB — CBG MONITORING, ED: Glucose-Capillary: 112 mg/dL — ABNORMAL HIGH (ref 70–99)

## 2023-11-28 LAB — HIV ANTIBODY (ROUTINE TESTING W REFLEX): HIV Screen 4th Generation wRfx: NONREACTIVE

## 2023-11-28 LAB — HEPATITIS B SURFACE ANTIGEN: Hepatitis B Surface Ag: NONREACTIVE

## 2023-11-28 MED ORDER — HEPARIN (PORCINE) 25000 UT/250ML-% IV SOLN
1300.0000 [IU]/h | INTRAVENOUS | Status: DC
  Administered 2023-11-28: 1300 [IU]/h via INTRAVENOUS
  Filled 2023-11-28: qty 250

## 2023-11-28 MED ORDER — IOHEXOL 350 MG/ML SOLN
75.0000 mL | Freq: Once | INTRAVENOUS | Status: AC | PRN
  Administered 2023-11-28: 75 mL via INTRAVENOUS

## 2023-11-28 MED ORDER — STROKE: EARLY STAGES OF RECOVERY BOOK
Freq: Once | Status: AC
  Filled 2023-11-28: qty 1

## 2023-11-28 MED ORDER — SODIUM CHLORIDE 0.9 % IV SOLN
2.0000 g | INTRAVENOUS | Status: AC
  Administered 2023-11-29 – 2023-12-03 (×4): 2 g via INTRAVENOUS
  Filled 2023-11-28 (×5): qty 20

## 2023-11-28 MED ORDER — HEPARIN BOLUS VIA INFUSION
2000.0000 [IU] | Freq: Once | INTRAVENOUS | Status: AC
  Administered 2023-11-28: 2000 [IU] via INTRAVENOUS
  Filled 2023-11-28: qty 2000

## 2023-11-28 MED ORDER — HEPARIN (PORCINE) 25000 UT/250ML-% IV SOLN
950.0000 [IU]/h | INTRAVENOUS | Status: DC
  Administered 2023-11-28 – 2023-11-29 (×2): 1100 [IU]/h via INTRAVENOUS
  Filled 2023-11-28 (×2): qty 250

## 2023-11-28 MED ORDER — LIDOCAINE HCL (PF) 1 % IJ SOLN: 5.0000 mL | INTRAMUSCULAR | Status: DC | PRN

## 2023-11-28 NOTE — Telephone Encounter (Signed)
 Patient Product/process development scientist completed.    The patient is insured through [employer]. Patient has Medicare and is not eligible for a copay card, but may be able to apply for patient assistance or Medicare RX Payment Plan (Patient Must reach out to their plan, if eligible for payment plan), if available.    Ran test claim for Eliquis  5 mg and the current 30 day co-pay is $288.83 due to a $590.00 deductible.  Ran test claim for Xarelto 20 mg and the current 30 day co-pay is $287.31 due to a $590.00 deductible.  This test claim was processed through [facility]- copay amounts may vary at other pharmacies due to pharmacy/plan contracts, or as the patient moves through the different stages of their insurance plan.     Morgan Arab, CPHT Pharmacy Technician III Certified Patient Advocate [facility] Patient Advocate Team Direct Number: [phone]  Fax: [phone]

## 2023-11-28 NOTE — ED Notes (Signed)
Patient placed onto cardiac monitor

## 2023-11-28 NOTE — Progress Notes (Addendum)
 [facility] Progress Note   Assessment/ Plan:   1 Acute hypoxic RF: CXR negative.  ? Aspiration vs edema-- but would expect to see on CXR.  Regardless- she does look a little volume overloaded. CTA + for PE, on hep gtt now  on antibiotics too for CAP 2 ESRD: MWF for now at [facility]- will do 4 hr rx with UF first and then HD.  Will need to use [facility].  Next HD tomorrow.  No heparin  with HD 3 Hypertension:  at her normal Bps now 4. Anemia of ESRD:  will dose mircera when indicated 5. Metabolic Bone Disease:  binders/ vits when eating 6.  Dispo admitted  Subjective:    Seen in room.  Had HD.  CTA this AM showed acute PE.  On hep gtt now.     Objective:   BP (!) 138/46   Pulse 100   Temp 98.3 F (36.8 C) (Oral)   Resp 16   Ht 5' 7 (1.702 m)   Wt 91.2 kg   SpO2 94%   BMI 31.49 kg/m   Physical Exam: GEN nad, lying in bed HEENT EOMI PERRL NECK + JVD PULM clear anteriorly CV RRR ABD soft EXT 1+ anasarca NEURO AAO x 3 SKIN warm and dry ACCESS:  R AVF + T/b  Labs: BMET Recent Labs  Lab 11/22/23 0348 11/23/23 0405 11/24/23 0407 11/25/23 0300 11/26/23 0252 11/27/23 0343 11/27/23 0358 11/27/23 0359 11/28/23 0532  NA 134* 135 138 136 134* 137 137 135 136  K 3.7 4.3 4.0 4.4 3.7 4.6 4.4 4.4 4.1  CL 98 97* 99 100 97* 98 96*  --  95*  CO2 28 26 28 28 25 28   --   --  27  GLUCOSE 68* 91 94 131* 124* 109* 108*  --  153*  BUN 46* 66* 36* 59* 33* 45* 45*  --  30*  CREATININE 4.83* 5.96* 3.78* 5.20* 4.02* 5.64* 6.00*  --  4.59*  CALCIUM  8.0* 7.9* 7.8* 7.9* 7.8* 8.6*  --   --  8.5*  PHOS 4.9* 6.0* 3.8 4.8* 3.4  --   --   --  3.6   CBC Recent Labs  Lab 11/24/23 0407 11/25/23 0300 11/25/23 0724 11/27/23 0343 11/27/23 0358 11/27/23 0359 11/28/23 0532  WBC 6.8 7.0  --  11.4*  --   --  11.4*  NEUTROABS  --   --   --  10.0*  --   --   --   HGB 9.1* 8.0*   < > 9.6* 9.9* 8.5* 7.5*  HCT 29.0* 26.5*   < > 30.8* 29.0* 25.0* 24.2*  MCV 96.7 99.3  --  99.7  --    --  100.8*  PLT 242 219  --  266  --   --  253   < > = values in this interval not displayed.      Medications:     aspirin   81 mg Oral Daily   atorvastatin   80 mg Oral QHS   buPROPion   150 mg Oral BID   carvedilol   12.5 mg Oral Q M,W,F   Chlorhexidine  Gluconate Cloth  6 each Topical Q0600   doxycycline   100 mg Oral Q12H   furosemide   80 mg Oral Q M,W,F   levothyroxine   175 mcg Oral QAC breakfast   memantine   10 mg Oral BID   sodium chloride  flush  3 mL Intravenous Q12H     Leandra Pro MD 11/28/2023, 10:51 AM

## 2023-11-28 NOTE — Progress Notes (Signed)
 PHARMACY - ANTICOAGULATION CONSULT NOTE  Pharmacy Consult for Heparin  Indication: pulmonary embolus  Allergies  Allergen Reactions   Glucophage [Metformin] Diarrhea   Tnkase  [Tenecteplase ] Other (See Comments)    Unknown reaction    Patient Measurements: Height: 5' 7 (170.2 cm) Weight: 91.2 kg (201 lb 1 oz) IBW/kg (Calculated) : 61.6 HEPARIN  DW (KG): 81.3  Vital Signs: Temp: 98.3 F (36.8 C) (06/17 0543) Temp Source: Oral (06/17 0543) BP: 137/56 (06/17 0543) Pulse Rate: 99 (06/17 0543)  Labs: Recent Labs    11/27/23 0343 11/27/23 0358 11/27/23 0359 11/28/23 0532  HGB 9.6* 9.9* 8.5* 7.5*  HCT 30.8* 29.0* 25.0* 24.2*  PLT 266  --   --  253  APTT 34  --   --   --   LABPROT 13.9  --   --   --   INR 1.1  --   --   --   CREATININE 5.64* 6.00*  --  4.59*    Estimated Creatinine Clearance: 13.6 mL/min (A) (by C-G formula based on SCr of 4.59 mg/dL (H)).   Medical History: Past Medical History:  Diagnosis Date   CAD (coronary artery disease)    cath 5/23 100% dist RCA lesion treated with 2 overlapping Integrity Resolute DES ( 2.25x63mm, 2.25x15mm), 60% mid RCA treated medically, 99% OM2 not amenable to PCI, 70% D1 lesion, EF normal   Hypercholesteremia    Hypertension    Hypothyroidism    Kidney disease    Renal disorder    Stroke (HCC) 10/2018   Thyroid  disease    Type 2 diabetes mellitus (HCC) 10/08/2018    Medications:  No current facility-administered medications on file prior to encounter.   Current Outpatient Medications on File Prior to Encounter  Medication Sig Dispense Refill   aspirin  81 MG chewable tablet Chew 1 tablet (81 mg total) by mouth daily. 30 tablet 2   atorvastatin  (LIPITOR ) 80 MG tablet TAKE 1 TABLET BY MOUTH ONCE DAILY AT  6  IN  THE  EVENING 90 tablet 3   bisacodyl  (DULCOLAX) 10 MG suppository Place 10 mg rectally daily as needed (no relief from Milk of Magnesia).     buPROPion  (WELLBUTRIN  SR) 150 MG 12 hr tablet Take 1 tablet by  mouth twice daily 180 tablet 1   carvedilol  (COREG ) 12.5 MG tablet Take 1 tablet (12.5 mg total) by mouth daily. On non-dialysis days (Patient taking differently: Take 12.5 mg by mouth every Monday, Wednesday, and Friday. Give on dialysis days : Monday, Wednesday, Friday) 30 tablet 1   doxycycline  (VIBRAMYCIN ) 100 MG capsule Take 100 mg by mouth every 12 (twelve) hours. 30 day course.     DULoxetine  (CYMBALTA ) 60 MG capsule Take 1 capsule by mouth once daily 90 capsule 2   ferrous sulfate  325 (65 FE) MG tablet Take 325 mg by mouth daily as needed (low iron at dialysis).     furosemide  (LASIX ) 80 MG tablet Take 80 mg by mouth every Monday, Wednesday, and Friday. Give on dialysis days : Monday, Wednesday, Friday     insulin  lispro protamine-lispro (HUMALOG  75/25 MIX) (75-25) 100 UNIT/ML SUSP injection Inject 10-25 Units into the skin See admin instructions. Inject 25 units with breakfast, 10 units with lunch, 25 units with supper     iron sucrose 100 mg in sodium chloride  0.9 % 100 mL Inject 100 mg into the vein as needed (iron-deficiency anemia). Venofer     IRON SUCROSE IV Inject 100 mg into the vein daily as needed (  iron deficiency anemia at dialysis).     levothyroxine  (SYNTHROID ) 175 MCG tablet Take 175 mcg by mouth daily before breakfast.     lidocaine -prilocaine  (EMLA ) cream Apply 1 Application topically every Monday, Wednesday, and Friday. Apply on dialysis days : Monday, Wednesday, Friday     Magnesium  Hydroxide (MILK OF MAGNESIA PO) Take 30 mLs by mouth daily as needed (no BM in 3 days).     memantine  (NAMENDA ) 10 MG tablet Take 1 tablet (10 mg total) by mouth 2 (two) times daily. 90 tablet 1   Methoxy PEG-Epoetin Beta (MIRCERA) 200 MCG/0.3ML SOSY 200 mcg by Implant route daily as needed (low RBC at dialysis). Mircera     nitroGLYCERIN  (NITROSTAT ) 0.4 MG SL tablet Place 0.4 mg under the tongue every 5 (five) minutes x 3 doses as needed for chest pain.     Nutritional Supplements (FEEDING  SUPPLEMENT, NEPRO CARB STEADY,) LIQD Take 237 mLs by mouth as needed (missed meal during dialysis.). (Patient taking differently: Take 237 mLs by mouth every Monday, Wednesday, and Friday. Give on dialysis days : Monday, Wednesday, Friday - for missed meal) 1000 mL 0   sevelamer  carbonate (RENVELA ) 0.8 g PACK packet Take 0.8 g by mouth 3 (three) times daily with meals.     Sodium Phosphates (ENEMA) ENEM Place 1 enema rectally daily as needed (no relief from bisacodyl  suppository).     tobramycin  (TOBREX ) 0.3 % ophthalmic solution Place 1 drop into the right eye every 2 (two) hours.       Assessment: 68 y.o. female admitted with SOB found to have LLL PE for heparin  Goal of Therapy:  Heparin  level 0.3-0.7 units/ml Monitor platelets by anticoagulation protocol: Yes   Plan:  Heparin  2000 units IV bolus, then start heparin  1300 units/hr Check heparin  level in 8 hours.    Jennifer [name] 11/28/2023,6:49 AM

## 2023-11-28 NOTE — Progress Notes (Signed)
 Spoke Tax adviser at [employer]. Confirmed pt was to start at [facility] yesterday (MWF 12:00 chair time) but pt did not start as planned. Plans had been for pt to receive out-pt HD at [facility] while pt at snf. Advised clinic staff that navigator would provide update to clinic on d/c plans and d/c date once known. Will assist as needed.   Lauraine Polite Renal Navigator [phone]

## 2023-11-28 NOTE — Progress Notes (Signed)
 RT asked to assess patient. Patient needs prompting. BS - clear/diminished, no cough, RT attempted flutter valve with patient. Required coaching to get a good effort.

## 2023-11-28 NOTE — Progress Notes (Signed)
 PHARMACY - ANTICOAGULATION CONSULT NOTE  Pharmacy Consult for Heparin  Indication: pulmonary embolus  Allergies  Allergen Reactions   Glucophage [Metformin] Diarrhea   Tnkase  [Tenecteplase ] Other (See Comments)    Unknown reaction    Patient Measurements: Height: 5' 7 (170.2 cm) Weight: 91.2 kg (201 lb 1 oz) IBW/kg (Calculated) : 61.6 HEPARIN  DW (KG): 81.3  Vital Signs: Temp: 98.3 F (36.8 C) (06/17 1642) Temp Source: Oral (06/17 1642) BP: 124/52 (06/17 1642) Pulse Rate: 101 (06/17 1642)  Labs: Recent Labs    11/27/23 0343 11/27/23 0358 11/27/23 0359 11/28/23 0532 11/28/23 1656  HGB 9.6* 9.9* 8.5* 7.5*  --   HCT 30.8* 29.0* 25.0* 24.2*  --   PLT 266  --   --  253  --   APTT 34  --   --   --   --   LABPROT 13.9  --   --   --   --   INR 1.1  --   --   --   --   HEPARINUNFRC  --   --   --   --  >1.10*  CREATININE 5.64* 6.00*  --  4.59*  --     Estimated Creatinine Clearance: 13.6 mL/min (A) (by C-G formula based on SCr of 4.59 mg/dL (H)).   Assessment: 68 y.o. female admitted with SOB found to have LLL PE for heparin . Noted pt also with acute stroke this admission.   Heparin  level >1.1. Level drawn from L hand and heparin  running in L AC. Level likely has some level of contamination but pt is restricted on other side so unable to draw from other side.   Goal of Therapy:  Heparin  level 0.3-0.5 units/ml (PE with acute stroke) Monitor platelets by anticoagulation protocol: Yes   Plan:  Hold heparin  x 1 hour Restart heparin  at 1100 units/hr F/u heparin  level 6 hours post restart. Will have RN pause heparin  for a few minutes and flush well before level drawn. May need to get footstick if unable to get accurate levels.  Enrigue Harvard, PharmD, BCPS Please see amion for complete clinical pharmacist phone list 11/28/2023,5:44 PM

## 2023-11-28 NOTE — Progress Notes (Signed)
 PT Cancellation Note  Patient Details Name: Jennifer [name] MRN: [id number] DOB: December 07, 1955   Cancelled Treatment:    Reason Eval/Treat Not Completed: Medical issues which prohibited therapy; patient with new PE and initiated Heparin  1701 yesterday.  Will follow up when 24 hours post starting Heparin  and levels in therapeutic range.    Marley Simmers 11/28/2023, 12:03 PM Abigail Hoff, PT Acute Rehabilitation Services Office:[phone] 11/28/2023

## 2023-11-28 NOTE — Progress Notes (Signed)
   11/28/23 0049  Vitals  BP (!) 98/50  MAP (mmHg) 65  BP Location Left Arm  BP Method Automatic  Patient Position (if appropriate) Lying  Pulse Rate 99  Pulse Rate Source Monitor  ECG Heart Rate 99  Resp 20  Oxygen  Therapy  SpO2 94 %  O2 Device Nasal Cannula  During Treatment Monitoring  Blood Flow Rate (mL/min) 0 mL/min  Arterial Pressure (mmHg) 27.67 mmHg  Venous Pressure (mmHg) -18.79 mmHg  TMP (mmHg) 39.79 mmHg  Ultrafiltration Rate (mL/min) 958 mL/min  Dialysate Flow Rate (mL/min) 299 ml/min  Dialysate Potassium Concentration 2  Dialysate Calcium  Concentration 2.5  Duration of HD Treatment -hour(s) 2.25 hour(s)  Cumulative Fluid Removed (mL) per Treatment  1225.69  HD Safety Checks Performed Yes  Intra-Hemodialysis Comments Tx completed  Post Treatment  Dialyzer Clearance Lightly streaked  Liters Processed 46  Fluid Removed (mL) 1200 mL  Tolerated HD Treatment Yes (Pt request to discontinue treatment.)  AVG/AVF Arterial Site Held (minutes) 10 minutes  AVG/AVF Venous Site Held (minutes) 10 minutes  Fistula / Graft Right Upper arm Arteriovenous fistula  Placement Date/Time: 06/28/22 0818   Placed prior to admission: No  Orientation: Right  Access Location: Upper arm  Access Type: Arteriovenous fistula  Site Condition No complications  Fistula / Graft Assessment Present;Thrill;Bruit  Status Deaccessed  Needle Size 15  Drainage Description None

## 2023-11-28 NOTE — ED Notes (Signed)
 Patient transported to CT

## 2023-11-28 NOTE — Significant Event (Addendum)
 Radiologist notified me that patient has pulmonary embolism involving the left side of the lung with no strain pattern.  Patient is presently hemodynamically stable.  I reviewed patient's recent labs CT head MRI brain notes medications.  Will start patient on heparin  infusion for pulmonary embolism.  Will check troponins BNP.  Reviewed 2D echo done on 11/21/2023.  Jennifer [name]

## 2023-11-28 NOTE — Consult Note (Signed)
 NEUROLOGY CONSULT NOTE   Date of service: November 28, 2023 Patient Name: Jennifer [name] MRN:  [id number] DOB:  Jul 28, 1955 Chief Complaint: Punctate stroke on MRI Requesting Provider: Etter Hermann., *  History of Present Illness  Jennifer [name] is a 68 y.o. 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  She was also found to be hypoxic, but this has improved.  She was found to have a pulmonary embolus and is now on anticoagulation with heparin .  MRI brain reveals punctate stroke in the left periventricular area.  LKW: Unclear Modified rankin score: 4-Needs assistance to walk and tend to bodily needs IV Thrombolysis: No, previous ICH EVT: No, exam not consistent with LVO  NIHSS components Score: Comment  1a Level of Conscious 0[x]  1[]  2[]  3[]      1b LOC Questions 0[]  1[]  2[x]       1c LOC Commands 0[x]  1[]  2[]       2 Best Gaze 0[x]  1[]  2[]       3 Visual 0[x]  1[]  2[]  3[]      4 Facial Palsy 0[]  1[x]  2[]  3[]      5a Motor Arm - left 0[]  1[x]  2[]  3[]  4[]  UN[]    5b Motor Arm - Right 0[x]  1[]  2[]  3[]  4[]  UN[]    6a Motor Leg - Left 0[]  1[]  2[]  3[x]  4[]  UN[]    6b Motor Leg - Right 0[]  1[]  2[x]  3[]  4[]  UN[]    7 Limb Ataxia 0[]  1[]  2[]  UN[x]      8 Sensory 0[]  1[x]  2[]  UN[]      9 Best Language 0[x]  1[]  2[]  3[]      10 Dysarthria 0[]  1[x]  2[]  UN[]      11 Extinct. and Inattention 0[x]  1[]  2[]       TOTAL:11       ROS   Unable to ascertain due to altered mental status  Past History   Past Medical History:  Diagnosis Date   CAD (coronary artery disease)    cath 5/23 100% dist RCA lesion treated with 2 overlapping Integrity Resolute DES ( 2.25x25mm, 2.25x33mm), 60% mid RCA treated medically, 99% OM2 not amenable to PCI, 70% D1 lesion, EF normal   Hypercholesteremia     Hypertension    Hypothyroidism    Kidney disease    Renal disorder    Stroke (HCC) 10/2018   Thyroid  disease    Type 2 diabetes mellitus (HCC) 10/08/2018    Past Surgical History:  Procedure Laterality Date   ABDOMINAL HYSTERECTOMY     AV FISTULA PLACEMENT Right 06/28/2022   Procedure: RIGHT ARM ARTERIOVENOUS (AV) FISTULA CREATION;  Surgeon: Mayo Speck, MD;  Location: [facility];  Service: Vascular;  Laterality: Right;   CARDIAC CATHETERIZATION N/A 11/03/2015   Procedure: Left Heart Cath and Coronary Angiography;  Surgeon: Arleen Lacer, MD;  Location: [facility] INVASIVE CV LAB;  Service: Cardiovascular;  Laterality: N/A;   CARDIAC CATHETERIZATION N/A 11/03/2015   Procedure: Coronary Stent Intervention;  Surgeon: Arleen Lacer, MD;  Location: [facility] INVASIVE CV LAB;  Service: Cardiovascular;  Laterality: N/A;   CARPAL TUNNEL RELEASE     CORONARY ANGIOPLASTY     1 stent   FISTULA SUPERFICIALIZATION Right 08/16/2022   Procedure: RIGHT ARTERIOVENOUS FISTULA SUPERFICIALIZATION;  Surgeon: Mayo Speck, MD;  Location: [facility];  Service: Vascular;  Laterality: Right;   TOTAL KNEE ARTHROPLASTY  Right 04/02/2019   Procedure: TOTAL KNEE ARTHROPLASTY;  Surgeon: Darrin Emerald, MD;  Location: [facility];  Service: Orthopedics;  Laterality: Right;    Family History: Family History  Problem Relation Age of Onset   Heart disease Mother    Stroke Sister    Heart attack Brother    Breast cancer Neg Hx     Social History  reports that she quit smoking about 2 years ago. Her smoking use included cigarettes. She has never used smokeless tobacco. She reports that she does not drink alcohol  and does not use drugs.  Allergies  Allergen Reactions   Glucophage [Metformin] Diarrhea   Tnkase  [Tenecteplase ] Other (See Comments)    Unknown reaction    Medications   Current Facility-Administered Medications:    [START ON 11/29/2023]  stroke: early stages of recovery book, , Does not apply, Once, de [place],  Cortney E, NP   acetaminophen  (TYLENOL ) tablet 650 mg, 650 mg, Oral, Q6H PRN **OR** acetaminophen  (TYLENOL ) suppository 650 mg, 650 mg, Rectal, Q6H PRN, Felipe Horton, Rondell A, MD   albuterol  (PROVENTIL ) (2.5 MG/3ML) 0.083% nebulizer solution 2.5 mg, 2.5 mg, Nebulization, Q4H PRN, Felipe Horton, Rondell A, MD   aspirin  chewable tablet 81 mg, 81 mg, Oral, Daily, Smith, Rondell A, MD, 81 mg at 11/28/23 1012   atorvastatin  (LIPITOR ) tablet 80 mg, 80 mg, Oral, QHS, Smith, Rondell A, MD   buPROPion  (WELLBUTRIN  SR) 12 hr tablet 150 mg, 150 mg, Oral, BID, Smith, Rondell A, MD   carvedilol  (COREG ) tablet 12.5 mg, 12.5 mg, Oral, Q M,W,F, Smith, Rondell A, MD   [START ON 11/29/2023] cefTRIAXone  (ROCEPHIN ) 2 g in sodium chloride  0.9 % 100 mL IVPB, 2 g, Intravenous, Q24H, [place], A Glory Larsen., MD   Chlorhexidine  Gluconate Cloth 2 % PADS 6 each, 6 each, Topical, Q0600, Leandra Pro, MD   doxycycline  (VIBRA -TABS) tablet 100 mg, 100 mg, Oral, Q12H, Smith, Rondell A, MD, 100 mg at 11/28/23 2536   furosemide  (LASIX ) tablet 80 mg, 80 mg, Oral, Q M,W,F, Smith, Rondell A, MD   heparin  ADULT infusion 100 units/mL (25000 units/250mL), 1,300 Units/hr, Intravenous, Continuous, Etter Hermann., MD, Last Rate: 13 mL/hr at 11/28/23 0724, 1,300 Units/hr at 11/28/23 0724   levothyroxine  (SYNTHROID ) tablet 175 mcg, 175 mcg, Oral, QAC breakfast, Manny Sees A, MD, 175 mcg at 11/28/23 0539   lidocaine  (PF) (XYLOCAINE ) 1 % injection 5 mL, 5 mL, Intradermal, PRN, Leandra Pro, MD   memantine  (NAMENDA ) tablet 10 mg, 10 mg, Oral, BID, Smith, Rondell A, MD   sodium chloride  flush (NS) 0.9 % injection 3 mL, 3 mL, Intravenous, Q12H, Smith, Rondell A, MD, 3 mL at 11/27/23 1000  Current Outpatient Medications:    aspirin  81 MG chewable tablet, Chew 1 tablet (81 mg total) by mouth daily., Disp: 30 tablet, Rfl: 2   atorvastatin  (LIPITOR ) 80 MG tablet, TAKE 1 TABLET BY MOUTH ONCE DAILY AT  6  IN  THE  EVENING, Disp: 90 tablet, Rfl: 3    bisacodyl  (DULCOLAX) 10 MG suppository, Place 10 mg rectally daily as needed (no relief from Milk of Magnesia)., Disp: , Rfl:    buPROPion  (WELLBUTRIN  SR) 150 MG 12 hr tablet, Take 1 tablet by mouth twice daily, Disp: 180 tablet, Rfl: 1   carvedilol  (COREG ) 12.5 MG tablet, Take 1 tablet (12.5 mg total) by mouth daily. On non-dialysis days (Patient taking differently: Take 12.5 mg by mouth every Monday, Wednesday, and Friday. Give on dialysis days : Monday, Wednesday, Friday), Disp:  30 tablet, Rfl: 1   doxycycline  (VIBRAMYCIN ) 100 MG capsule, Take 100 mg by mouth every 12 (twelve) hours. 30 day course., Disp: , Rfl:    DULoxetine  (CYMBALTA ) 60 MG capsule, Take 1 capsule by mouth once daily, Disp: 90 capsule, Rfl: 2   ferrous sulfate  325 (65 FE) MG tablet, Take 325 mg by mouth daily as needed (low iron at dialysis)., Disp: , Rfl:    furosemide  (LASIX ) 80 MG tablet, Take 80 mg by mouth every Monday, Wednesday, and Friday. Give on dialysis days : Monday, Wednesday, Friday, Disp: , Rfl:    insulin  lispro protamine-lispro (HUMALOG  75/25 MIX) (75-25) 100 UNIT/ML SUSP injection, Inject 10-25 Units into the skin See admin instructions. Inject 25 units with breakfast, 10 units with lunch, 25 units with supper, Disp: , Rfl:    iron sucrose 100 mg in sodium chloride  0.9 % 100 mL, Inject 100 mg into the vein as needed (iron-deficiency anemia). Venofer, Disp: , Rfl:    IRON SUCROSE IV, Inject 100 mg into the vein daily as needed (iron deficiency anemia at dialysis)., Disp: , Rfl:    levothyroxine  (SYNTHROID ) 175 MCG tablet, Take 175 mcg by mouth daily before breakfast., Disp: , Rfl:    lidocaine -prilocaine  (EMLA ) cream, Apply 1 Application topically every Monday, Wednesday, and Friday. Apply on dialysis days : Monday, Wednesday, Friday, Disp: , Rfl:    Magnesium  Hydroxide (MILK OF MAGNESIA PO), Take 30 mLs by mouth daily as needed (no BM in 3 days)., Disp: , Rfl:    memantine  (NAMENDA ) 10 MG tablet, Take 1 tablet  (10 mg total) by mouth 2 (two) times daily., Disp: 90 tablet, Rfl: 1   Methoxy PEG-Epoetin Beta (MIRCERA) 200 MCG/0.3ML SOSY, 200 mcg by Implant route daily as needed (low RBC at dialysis). Mircera, Disp: , Rfl:    nitroGLYCERIN  (NITROSTAT ) 0.4 MG SL tablet, Place 0.4 mg under the tongue every 5 (five) minutes x 3 doses as needed for chest pain., Disp: , Rfl:    Nutritional Supplements (FEEDING SUPPLEMENT, NEPRO CARB STEADY,) LIQD, Take 237 mLs by mouth as needed (missed meal during dialysis.). (Patient taking differently: Take 237 mLs by mouth every Monday, Wednesday, and Friday. Give on dialysis days : Monday, Wednesday, Friday - for missed meal), Disp: 1000 mL, Rfl: 0   sevelamer  carbonate (RENVELA ) 0.8 g PACK packet, Take 0.8 g by mouth 3 (three) times daily with meals., Disp: , Rfl:    Sodium Phosphates (ENEMA) ENEM, Place 1 enema rectally daily as needed (no relief from bisacodyl  suppository)., Disp: , Rfl:    tobramycin  (TOBREX ) 0.3 % ophthalmic solution, Place 1 drop into the right eye every 2 (two) hours., Disp: , Rfl:   Vitals   Vitals:   11/28/23 0845 11/28/23 0900 11/28/23 1145 11/28/23 1311  BP: (!) 119/53 (!) 138/46 (!) 134/49   Pulse: (!) 102 100 97 (!) 105  Resp:  16 19 16   Temp:      TempSrc:      SpO2: 98% 94% 94% 93%  Weight:      Height:        Body mass index is 31.49 kg/m.   Physical Exam   Constitutional: Chronically ill-appearing elderly patient in no acute distress Psych: Affect appropriate to situation.  Eyes: No scleral injection.  HENT: No OP obstruction.  Head: Normocephalic.  Respiratory: Effort normal, non-labored breathing.  Skin: Hematoma on right shin  Neurologic Examination    NEURO:  Mental Status: Responds to name but states she is  in a parking lot and is also disoriented to time and situation, unable to give history of present illness Speech/Language: speech is with mild dysarthria but no aphasia  Cranial Nerves:  II: PERRL.  Blinks to  threat bilaterally III, IV, VI: EOMI. Eyelids elevate symmetrically.  V: Sensation is intact to light touch and symmetrical to face.  VII: Right facial droop VIII: hearing intact to voice. IX, X: Voice is slightly dysarthric UJ:[id number] shrug send trickle XII: tongue is midline without fasciculations. Motor: Able to move bilateral upper extremities with antigravity strength, some drift on the left, able to lift right lower extremity off the bed but not left Tone: is normal and bulk is normal Sensation- Intact to light touch bilaterally, but sensation is rougher on the right Coordination: Unable to perform Gait- deferred   Labs/Imaging/Neurodiagnostic studies   CBC:  Recent Labs  Lab Dec 10, 2023 0343 December 10, 2023 0358 12-10-23 0359 11/28/23 0532  WBC 11.4*  --   --  11.4*  NEUTROABS 10.0*  --   --   --   HGB 9.6*   < > 8.5* 7.5*  HCT 30.8*   < > 25.0* 24.2*  MCV 99.7  --   --  100.8*  PLT 266  --   --  253   < > = values in this interval not displayed.   Basic Metabolic Panel:  Lab Results  Component Value Date   NA 136 11/28/2023   K 4.1 11/28/2023   CO2 27 11/28/2023   GLUCOSE 153 (H) 11/28/2023   BUN 30 (H) 11/28/2023   CREATININE 4.59 (H) 11/28/2023   CALCIUM  8.5 (L) 11/28/2023   GFRNONAA 10 (L) 11/28/2023   GFRAA 43 (L) 02/05/2020   Lipid Panel:  Lab Results  Component Value Date   LDLCALC 51 11/21/2023   HgbA1c:  Lab Results  Component Value Date   HGBA1C 6.3 (H) 11/20/2023   Urine Drug Screen:     Component Value Date/Time   LABOPIA NONE DETECTED 11/20/2023 2215   COCAINSCRNUR NONE DETECTED 11/20/2023 2215   LABBENZ NONE DETECTED 11/20/2023 2215   AMPHETMU NONE DETECTED 11/20/2023 2215   THCU NONE DETECTED 11/20/2023 2215   LABBARB NONE DETECTED 11/20/2023 2215    Alcohol  Level     Component Value Date/Time   ETH <15 December 10, 2023 0343   INR  Lab Results  Component Value Date   INR 1.1 12-10-2023   APTT  Lab Results  Component Value Date    APTT 34 12/10/2023   AED levels: No results found for: PHENYTOIN, ZONISAMIDE, LAMOTRIGINE, LEVETIRACETA  CT Head without contrast(Personally reviewed): No acute abnormality, atrophy and chronic small vessel ischemic disease  MR Angio head without contrast and Carotid Duplex BL(Personally reviewed): Pending  MRI Brain(Personally reviewed): Punctate acute infarct in left periventricular white matter   ASSESSMENT   Jennifer [name] is a 68 y.o. 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  She was found to have a pulmonary embolus and is now anticoagulated with heparin .  Hypoglycemia has been treated.  MRI brain revealed a punctate acute infarct in left periventricular white matter.  Most likely etiology of this is small vessel disease.  Will begin full stroke workup.  As patient is anticoagulated with heparin  for pulmonary embolus, would be okay holding off aspirin  given previous history of pontine hemorrhage.  RECOMMENDATIONS  Stroke/TIA Workup  - Permissive HTN x48 hrs goal BP <220/110. PRN labetalol  or hydralazine  if BP above these parameters. Avoid oral antihypertensives. - MRA head and carotid ultrasound - No need for TTE as 1 was performed last week - Check A1c and LDL + add statin per guidelines - Fully anticoagulated with heparin , okay to hold off on aspirin  and Plavix  at this time - q4 hr neuro checks - STAT head CT for any change in neuro exam - Tele - PT/OT/SLP - Stroke education - Amb referral to neurology upon discharge   ______________________________________________________________________  Patient seen by NP and then by MD, MD to edit note as needed.  Signed, Cortney E Bucky Cardinal, NP [facility]    NEUROHOSPITALIST  ADDENDUM Performed a face to face diagnostic evaluation.   I have reviewed the contents of history and physical exam as documented by PA/ARNP/Resident and agree with above documentation.  I have discussed and formulated the above plan as documented. Edits to the note have been made as needed.  Bulah Lurie, MD [facility] [phone]   If 7pm to 7am, please call on call as listed on AMION.

## 2023-11-28 NOTE — Plan of Care (Signed)
  Problem: Education: Goal: Ability to describe self-care measures that may prevent or decrease complications (Diabetes Survival Skills Education) will improve Outcome: Progressing Goal: Individualized Educational Video(s) Outcome: Progressing   Problem: Coping: Goal: Ability to adjust to condition or change in health will improve Outcome: Progressing   Problem: Fluid Volume: Goal: Ability to maintain a balanced intake and output will improve Outcome: Progressing   Problem: Health Behavior/Discharge Planning: Goal: Ability to identify and utilize available resources and services will improve Outcome: Progressing Goal: Ability to manage health-related needs will improve Outcome: Progressing   Problem: Metabolic: Goal: Ability to maintain appropriate glucose levels will improve Outcome: Progressing   Problem: Nutritional: Goal: Maintenance of adequate nutrition will improve Outcome: Progressing Goal: Progress toward achieving an optimal weight will improve Outcome: Progressing   Problem: Skin Integrity: Goal: Risk for impaired skin integrity will decrease Outcome: Progressing   Problem: Tissue Perfusion: Goal: Adequacy of tissue perfusion will improve Outcome: Progressing   Problem: Education: Goal: Knowledge of General Education information will improve Description: Including pain rating scale, medication(s)/side effects and non-pharmacologic comfort measures Outcome: Progressing   Problem: Health Behavior/Discharge Planning: Goal: Ability to manage health-related needs will improve Outcome: Progressing   Problem: Clinical Measurements: Goal: Ability to maintain clinical measurements within normal limits will improve Outcome: Progressing Goal: Will remain free from infection Outcome: Progressing Goal: Diagnostic test results will improve Outcome: Progressing Goal: Respiratory complications will improve Outcome: Progressing Goal: Cardiovascular complication will  be avoided Outcome: Progressing   Problem: Activity: Goal: Risk for activity intolerance will decrease Outcome: Progressing   Problem: Nutrition: Goal: Adequate nutrition will be maintained Outcome: Progressing   Problem: Coping: Goal: Level of anxiety will decrease Outcome: Progressing   Problem: Elimination: Goal: Will not experience complications related to bowel motility Outcome: Progressing Goal: Will not experience complications related to urinary retention Outcome: Progressing   Problem: Pain Managment: Goal: General experience of comfort will improve and/or be controlled Outcome: Progressing   Problem: Safety: Goal: Ability to remain free from injury will improve Outcome: Progressing   Problem: Skin Integrity: Goal: Risk for impaired skin integrity will decrease Outcome: Progressing   Problem: Education: Goal: Knowledge of disease or condition will improve Outcome: Progressing Goal: Knowledge of secondary prevention will improve (MUST DOCUMENT ALL) Outcome: Progressing Goal: Knowledge of patient specific risk factors will improve (DELETE if not current risk factor) Outcome: Progressing   Problem: Ischemic Stroke/TIA Tissue Perfusion: Goal: Complications of ischemic stroke/TIA will be minimized Outcome: Progressing   Problem: Coping: Goal: Will verbalize positive feelings about self Outcome: Progressing Goal: Will identify appropriate support needs Outcome: Progressing   Problem: Health Behavior/Discharge Planning: Goal: Ability to manage health-related needs will improve Outcome: Progressing Goal: Goals will be collaboratively established with patient/family Outcome: Progressing   Problem: Self-Care: Goal: Ability to participate in self-care as condition permits will improve Outcome: Progressing Goal: Verbalization of feelings and concerns over difficulty with self-care will improve Outcome: Progressing Goal: Ability to communicate needs  accurately will improve Outcome: Progressing   Problem: Nutrition: Goal: Risk of aspiration will decrease Outcome: Progressing Goal: Dietary intake will improve Outcome: Progressing   Problem: Activity: Goal: Ability to tolerate increased activity will improve Outcome: Progressing   Problem: Clinical Measurements: Goal: Ability to maintain a body temperature in the normal range will improve Outcome: Progressing   Problem: Respiratory: Goal: Ability to maintain adequate ventilation will improve Outcome: Progressing Goal: Ability to maintain a clear airway will improve Outcome: Progressing

## 2023-11-28 NOTE — ED Notes (Signed)
 Lab contacted via cell phone to add troponin and BNP

## 2023-11-28 NOTE — Progress Notes (Signed)
 PROGRESS NOTE    Jennifer ANDRE  ZOX:[id number] DOB: 1955-08-14 DOA: 11/27/2023 PCP: Clemens Curt, MD  Chief Complaint  Patient presents with   Respiratory Distress    Brief Narrative:   Jennifer [name] is Jennifer [name] 68 y.o. female with medical history significant of hypertension, CAD, CVA, diabetes mellitus type 2, ESRD on HD, hypothyroidism, anxiety, depression, and memory loss  altered mental status and hypoglycemia.   Records note patient had just recently been hospitalized 6/9 - 6/12 for acute left-sided weakness without clear signs of CVA on MRI or seizure by EEG.  During hospital stay patient was noted to have E. coli and Proteus in urine cultures and started on Rocephin  6/12 completing course prior to discharge.  During her stay she was also transition to Jennifer [name] Monday, Wednesday, Friday schedule for hemodialysis.   She was brought to the hospital from [facility] rehab facility after last night, she was noted to be altered and unresponsive with Jennifer [name] low blood sugar level in the fifties. Her oxygen  saturation was also low, in the 60s, and she was given Jennifer [name] Duoneb treatment, which only increased her oxygen  saturation to 80-81. She was lethargic and not responding well, leading to Jennifer [name] call to emergency services.   Assessment & Plan:   Active Problems:   Acute respiratory failure with hypoxia (HCC)   ESRD on hemodialysis (HCC)   Acute metabolic encephalopathy   Controlled type 2 diabetes mellitus with hypoglycemia (HCC)   SIRS (systemic inflammatory response syndrome) (HCC)   Essential hypertension   Acquired hypothyroidism   Normocytic anemia   HLD (hyperlipidemia)   History of CVA (cerebrovascular accident)   Memory loss   Anxiety  Acute respiratory failure with hypoxia Patient was noted to be in respiratory distress with O2 saturations reported in the 60s on 5 L with improvement after being placed on 15 L nonrebreather.  O2 needs now resolved, but CT PE protocol with L lung lingular lobar and  lower lobe basal segmetnal branch PE.  Sattered patchy and confluent LLL opacity, possible pulmonary infarct and infection/bronchopneumonia Treatment as below   Acute Pulmonary Embolism with Possible Pulmonary Infarct Heparin  gtt Echo, LE US  BNP and troponin pending (expect these will be elevated in setting of her ESRD)  Community Acquired Pneumonia Systemic Inflammatory Response Syndrome Hypothermia at presentation, hypoxia (hypothermia potentially related to hypoglycemia) - SIRS related to hypoglycemia + encephalopathy  and PE vs sepsis due to pneumonia.    Ceftriaxone , doxycycline  to cover empirically for pneumonia.  MRSA PCR.  Urine strep, urine legionella if able to collect.  She wasn't coughing, will order sputum cx if coughing.   Follow pending blood cultures.  Pending UA if able to collect.    Acute metabolic encephalopathy History of Dementia Patient noted to be acutely altered and was not responding to staff at the facility like normal.  Symptoms likely secondary to hypoglycemia with possible contribution related to infection/hypoxia.  Delirium precautions - seems mildly confused this morning - will speak to family for collateral/baseline MRI brain with possible punctate acute lacunar infarct - will discuss with neurology   Hypoglycemia  Diabetes mellitus type 2 with hypoglycemia due to insulin  Hypoglycemia to 17 on admission Appreciate assistance of ED pharmacist, during last admission became hypoglycemic with 25 units glargine.  Also, after that, didn't require more than 1-2 units of SSI insulin  per day to remain at goal.  A1c 6.3.  Will discontinue her insulin  at discharge.  - no need for SSI now - follow  BG's today, if stable, consider just checking daily BG in am starting tmrw   ESRD on HD Patient currently on Ricarda Atayde Monday, Wednesday, Friday schedule.  - Nephrology consulted on need of hemodialysis.   Hypertension - Continue metoprolol  and furosemide  as tolerated    Hypothyroidism TSH noted to be within normal limits at 1.353 when checked on 11/20/2023 - Continue levothyroxine    Normocytic anemia Chronic.   Hb down to 7.5 this AM No si/sx bleeding, will check repeat Hb this PM   Right Shin Hematoma Outline, monitor closely with need for heparin  with PE No acute fx/dislocation on tib fib plain films R Knee plain films pending  History of CVA Hyperlipidemia - Continue aspirin  and atorvastatin    Memory loss - Delirium precautions - Continue Namenda    Anxiety  - Continue current medication regimen  Lives with husband/daughter.  At baseline, before last hospitalization, could transfer, but no extended walking with assistive device.  Wheelchair bound for past year.      DVT prophylaxis: heparin  gtt Code Status: full Family Communication: none Disposition:   Status is: Observation The patient remains OBS appropriate and will d/c before 2 midnights.   Consultants:  Neurology Nephrology  Procedures:  none  Antimicrobials:  Anti-infectives (From admission, onward)    Start     Dose/Rate Route Frequency Ordered Stop   11/28/23 0500  cefTRIAXone  (ROCEPHIN ) 2 g in sodium chloride  0.9 % 100 mL IVPB        2 g 200 mL/hr over 30 Minutes Intravenous  Once 11/27/23 2032 11/28/23 0709   11/27/23 2200  doxycycline  (VIBRA -TABS) tablet 100 mg       Note to Pharmacy: 30 day course.     100 mg Oral Every 12 hours 11/27/23 2141     11/27/23 0415  cefTRIAXone  (ROCEPHIN ) 2 g in sodium chloride  0.9 % 100 mL IVPB        2 g 200 mL/hr over 30 Minutes Intravenous  Once 11/27/23 0406 11/27/23 0544       Subjective: Pleasantly confused Denies complaints  Objective: Vitals:   11/28/23 0300 11/28/23 0515 11/28/23 0543 11/28/23 0730  BP: (!) 122/51 (!) 134/48 (!) 137/56 (!) 130/49  Pulse: (!) 103  99   Resp:   16   Temp:   98.3 F (36.8 C)   TempSrc:   Oral   SpO2: 94%  100%   Weight:      Height:        Intake/Output Summary (Last 24  hours) at 11/28/2023 0824 Last data filed at 11/28/2023 0049 Gross per 24 hour  Intake --  Output 1200 ml  Net -1200 ml   Filed Weights   11/27/23 0344  Weight: 91.2 kg    Examination:  General: No acute distress. Cardiovascular: RRR Lungs: unlabored Abdomen: Soft, nontender, nondistended  Neurological: Alert, but somewhat confused.  R facial droop. Moves all extremities 4. Cranial nerves II through XII grossly intact. Extremities: R shin hematoma, approximately 3 cm in diameter - asked RN to circle  Data Reviewed: I have personally reviewed following labs and imaging studies  CBC: Recent Labs  Lab 11/23/23 0405 11/24/23 0407 11/25/23 0300 11/25/23 0724 11/27/23 0343 11/27/23 0358 11/27/23 0359 11/28/23 0532  WBC 7.9 6.8 7.0  --  11.4*  --   --  11.4*  NEUTROABS  --   --   --   --  10.0*  --   --   --   HGB 8.8* 9.1* 8.0* 8.7* 9.6* 9.9*  8.5* 7.5*  HCT 28.2* 29.0* 26.5* 27.5* 30.8* 29.0* 25.0* 24.2*  MCV 96.2 96.7 99.3  --  99.7  --   --  100.8*  PLT 253 242 219  --  266  --   --  253    Basic Metabolic Panel: Recent Labs  Lab 11/22/23 0348 11/23/23 0405 11/24/23 0407 11/25/23 0300 11/26/23 0252 11/27/23 0343 11/27/23 0358 11/27/23 0359 11/28/23 0532  NA 134* 135 138 136 134* 137 137 135 136  K 3.7 4.3 4.0 4.4 3.7 4.6 4.4 4.4 4.1  CL 98 97* 99 100 97* 98 96*  --  95*  CO2 28 26 28 28 25 28   --   --  27  GLUCOSE 68* 91 94 131* 124* 109* 108*  --  153*  BUN 46* 66* 36* 59* 33* 45* 45*  --  30*  CREATININE 4.83* 5.96* 3.78* 5.20* 4.02* 5.64* 6.00*  --  4.59*  CALCIUM  8.0* 7.9* 7.8* 7.9* 7.8* 8.6*  --   --  8.5*  MG 2.0 2.2 2.1 2.3  --   --   --   --   --   PHOS 4.9* 6.0* 3.8 4.8* 3.4  --   --   --  3.6    GFR: Estimated Creatinine Clearance: 13.6 mL/min (Paxson Harrower) (by C-G formula based on SCr of 4.59 mg/dL (H)).  Liver Function Tests: Recent Labs  Lab 11/24/23 0407 11/25/23 0300 11/26/23 0252 11/27/23 0343 11/28/23 0532  AST  --   --   --  21  --    ALT  --   --   --  13  --   ALKPHOS  --   --   --  54  --   BILITOT  --   --   --  0.6  --   PROT  --   --   --  5.2*  --   ALBUMIN 2.2* 2.1* 2.3* 2.2* 2.3*    CBG: Recent Labs  Lab 11/27/23 1658 11/27/23 1723 11/27/23 1727 11/27/23 1836 11/28/23 0209  GLUCAP 44* 17* 51* 132* 112*     Recent Results (from the past 240 hours)  Urine Culture (for pregnant, neutropenic or urologic patients or patients with an indwelling urinary catheter)     Status: Abnormal   Collection Time: 11/20/23 10:15 PM   Specimen: In/Out Cath Urine  Result Value Ref Range Status   Specimen Description   Final    IN/OUT CATH URINE Performed at [facility], [address] [place], [place] [address]    Special Requests   Final    NONE Performed at [facility], [address] [place], [place] [address]    Culture (Sylvanna Burggraf)  Final    >=100,000 COLONIES/mL ESCHERICHIA COLI 40,000 COLONIES/mL PROTEUS MIRABILIS    Report Status 11/23/2023 FINAL  Final   Organism ID, Bacteria ESCHERICHIA COLI (Katja Blue)  Final   Organism ID, Bacteria PROTEUS MIRABILIS (Asami Lambright)  Final      Susceptibility   Escherichia coli - MIC*    AMPICILLIN >=32 RESISTANT Resistant     CEFAZOLIN  <=4 SENSITIVE Sensitive     CEFEPIME <=0.12 SENSITIVE Sensitive     CEFTRIAXONE  <=0.25 SENSITIVE Sensitive     CIPROFLOXACIN <=0.25 SENSITIVE Sensitive     GENTAMICIN >=16 RESISTANT Resistant     IMIPENEM <=0.25 SENSITIVE Sensitive     NITROFURANTOIN <=16 SENSITIVE Sensitive     TRIMETH/SULFA >=320 RESISTANT Resistant     AMPICILLIN/SULBACTAM 16 INTERMEDIATE Intermediate  PIP/TAZO <=4 SENSITIVE Sensitive ug/mL    * >=100,000 COLONIES/mL ESCHERICHIA COLI   Proteus mirabilis - MIC*    AMPICILLIN <=2 SENSITIVE Sensitive     CEFAZOLIN  8 SENSITIVE Sensitive     CEFEPIME <=0.12 SENSITIVE Sensitive     CEFTRIAXONE  <=0.25 SENSITIVE Sensitive     CIPROFLOXACIN <=0.25 SENSITIVE Sensitive     GENTAMICIN <=1 SENSITIVE Sensitive     IMIPENEM 2 SENSITIVE  Sensitive     NITROFURANTOIN 128 RESISTANT Resistant     TRIMETH/SULFA <=20 SENSITIVE Sensitive     AMPICILLIN/SULBACTAM <=2 SENSITIVE Sensitive     PIP/TAZO <=4 SENSITIVE Sensitive ug/mL    * 40,000 COLONIES/mL PROTEUS MIRABILIS  Culture, blood (routine x 2)     Status: None (Preliminary result)   Collection Time: 11/27/23  4:04 AM   Specimen: BLOOD  Result Value Ref Range Status   Specimen Description BLOOD BLOOD LEFT HAND  Final   Special Requests   Final    BOTTLES DRAWN AEROBIC ONLY Blood Culture adequate volume   Culture   Final    NO GROWTH 1 DAY Performed at [facility], [address] [place], [place] [address]    Report Status PENDING  Incomplete  Culture, blood (routine x 2)     Status: None (Preliminary result)   Collection Time: 11/27/23  5:02 AM   Specimen: BLOOD  Result Value Ref Range Status   Specimen Description BLOOD BLOOD LEFT HAND AEROBIC BOTTLE ONLY  Final   Special Requests   Final    BOTTLES DRAWN AEROBIC ONLY Blood Culture adequate volume   Culture   Final    NO GROWTH 1 DAY Performed at [facility], [address] [place], [place] [address]    Report Status PENDING  Incomplete         Radiology Studies: CT Angio Chest Pulmonary Embolism (PE) W or WO Contrast Addendum Date: 11/28/2023 ADDENDUM REPORT: 11/28/2023 07:13 ADDENDUM: Study discussed by telephone with Dr. [facility] Lavender on 11/28/2023 at 0644 hours. Electronically Signed   By: Marlise Simpers M.D.   On: 11/28/2023 07:13   Result Date: 11/28/2023 CLINICAL DATA:  68 year old female with respiratory distress. EXAM: CT ANGIOGRAPHY CHEST WITH CONTRAST TECHNIQUE: Multidetector CT imaging of the chest was performed using the standard protocol during bolus administration of intravenous contrast. Multiplanar CT image reconstructions and MIPs were obtained to evaluate the vascular anatomy. RADIATION DOSE REDUCTION: This exam was performed according to the departmental dose-optimization program which  includes automated exposure control, adjustment of the mA and/or kV according to patient size and/or use of iterative reconstruction technique. CONTRAST:  75mL OMNIPAQUE  IOHEXOL  350 MG/ML SOLN COMPARISON:  Portable chest yesterday. CT Abdomen and Pelvis 03/22/2022. FINDINGS: Cardiovascular: Adequate contrast bolus timing in the pulmonary arterial tree. Mild respiratory motion. There is low-density pulmonary artery embolus in the left lung, lingula lobar branch series 7, image 188. But no other lobar pulmonary embolus in the left lung. Basal segmental pulmonary artery detail is limited by respiratory motion but there does appear to be posterior basal segmental pulmonary embolus on series 7, image 278, and regional abnormal lung parenchyma there (see below). No central or saddle embolus. No contralateral right lung pulmonary artery filling defect is identified. Calcified aortic atherosclerosis. Calcified coronary artery atherosclerosis. Heart size within normal limits, stable, no pericardial effusion. RV LV ratio appears to remain within normal limits. Mediastinum/Nodes: Negative for mediastinal mass or lymphadenopathy. Lungs/Pleura: Major airways are patent. There is abnormal left lower lobe opacity which  is both scattered, nodular, peribronchial, and also confluent in the posterior basal segment on series 6, image 102. No pleural effusion superimposed. No lingula pulmonary infarct or confluent opacity. Contralateral streaky right costophrenic angle opacity most resembles atelectasis, and mild scarring was also present there on the 2023 comparison. No right pleural effusion. Upper Abdomen: Negative visible early contrasted appearance of the liver, spleen, pancreas, adrenal glands, left kidney, and bowel in the upper abdomen. Musculoskeletal: Flowing thoracic endplate osteophytes resulting in some levels of interbody ankylosis. No acute or suspicious osseous lesion identified. Review of the MIP images confirms the  above findings. IMPRESSION: 1. Positive for Left lung Lingula lobar and Lower lobe basal segmental branch Pulmonary emboli. No convincing CTA evidence of right heart strain. 2. No pleural effusion. No lingula infarct. Scattered, patchy, and confluent left lower lobe opacity which may be Dannisha Eckmann combination of pulmonary infarct and infection/Bronchopneumonia. 3. Calcified coronary artery and Aortic Atherosclerosis (ICD10-I70.0). Electronically Signed: By: Marlise Simpers M.D. On: 11/28/2023 06:33   DG Tibia/Fibula Right Result Date: 11/27/2023 CLINICAL DATA:  Right knee and lower leg pain. EXAM: RIGHT TIBIA AND FIBULA - 2 VIEW COMPARISON:  11/27/2023. FINDINGS: There is no evidence of acute fracture or dislocation. Total knee arthroplasty changes are noted without evidence of hardware loosening. Vascular calcifications are noted in the soft tissues. Soft tissue thickening is noted anterior to the mid tibial shaft. IMPRESSION: No acute fracture or dislocation. Electronically Signed   By: Wyvonnia Heimlich M.D.   On: 11/27/2023 20:46   MR BRAIN WO CONTRAST Result Date: 11/27/2023 CLINICAL DATA:  Mental status change, unknown cause Neuro deficit, acute, stroke suspected EXAM: MRI HEAD WITHOUT CONTRAST TECHNIQUE: Multiplanar, multiecho pulse sequences of the brain and surrounding structures were obtained without intravenous contrast. COMPARISON:  CT of the head dated November 27, 2023. FINDINGS: Brain: There is Kaliopi Blyden punctate focus of high diffusion signal present along the medial surface of the anterior horn of the left lateral ventricle seen on image 27 of series 2. The diffusion-weighted images are otherwise unremarkable. There is global cerebral volume loss, which is advanced for the patient's age. There is also extensive diffuse cerebral white matter disease. Chronic encephalomalacia changes are noted within the left cerebellar hemisphere. There is no evidence of acute hemorrhage, mass or hydrocephalus. There are numerous blooming  artifacts present within the brainstem, periventricular white matter, mesial temporal lobes and the cerebellum. Vascular: Normal flow voids. Skull and upper cervical spine: Normal bone marrow signal. Sinuses/Orbits: Status post bilateral lens replacement. The paranasal sinuses are clear. Other: None. IMPRESSION: 1. Questionable punctate acute lacunar infarct involving the left periventricular white matter. 2. Extensive cerebral white matter disease and chronic encephalomalacia changes within the left cerebellar hemisphere. 3. Numerous foci of hemosiderin staining primarily within the brainstem, periventricular white matter and mesial temporal lobes. Electronically Signed   By: Maribeth Shivers M.D.   On: 11/27/2023 13:56   CT HEAD WO CONTRAST Result Date: 11/27/2023 CLINICAL DATA:  Mental status changes. EXAM: CT HEAD WITHOUT CONTRAST TECHNIQUE: Contiguous axial images were obtained from the base of the skull through the vertex without intravenous contrast. RADIATION DOSE REDUCTION: This exam was performed according to the departmental dose-optimization program which includes automated exposure control, adjustment of the mA and/or kV according to patient size and/or use of iterative reconstruction technique. COMPARISON:  Brain MRI 11/21/2023.  Head CT 11/20/2023 FINDINGS: Brain: There is no evidence for acute hemorrhage, hydrocephalus, mass lesion, or abnormal extra-axial fluid collection. No definite CT evidence for acute  infarction. Diffuse loss of parenchymal volume is consistent with atrophy. Patchy low attenuation in the deep hemispheric and periventricular white matter is nonspecific, but likely reflects chronic microvascular ischemic demyelination. 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 Vascular: No hyperdense vessel or unexpected calcification. Skull: No evidence for fracture. No worrisome lytic or sclerotic lesion. Sinuses/Orbits: The visualized paranasal sinuses and mastoid air cells are clear.  Visualized portions of the globes and intraorbital fat are unremarkable. Other: None. IMPRESSION: 1. No acute intracranial abnormality. 2. Atrophy with chronic small vessel ischemic disease. Electronically Signed   By: Donnal Fusi M.D.   On: 11/27/2023 06:09   DG Chest Port 1 View Result Date: 11/27/2023 CLINICAL DATA:  68 year old female with altered mental status and hypoxia. EXAM: PORTABLE CHEST 1 VIEW COMPARISON:  Portable chest 05/20/2023 and earlier. FINDINGS: Portable AP semi upright view at 0355 hours. Calcified aortic atherosclerosis. Other mediastinal contours are within normal limits. Lung volumes are within normal limits. Visualized tracheal air column is within normal limits. Allowing for portable technique the lungs are clear. No pneumothorax or pleural effusion. No acute osseous abnormality identified. Negative visible bowel gas. IMPRESSION: No acute cardiopulmonary abnormality. Aortic Atherosclerosis (ICD10-I70.0). Electronically Signed   By: Marlise Simpers M.D.   On: 11/27/2023 04:09        Scheduled Meds:  aspirin   81 mg Oral Daily   atorvastatin   80 mg Oral QHS   buPROPion   150 mg Oral BID   carvedilol   12.5 mg Oral Q M,W,F   Chlorhexidine  Gluconate Cloth  6 each Topical Q0600   doxycycline   100 mg Oral Q12H   furosemide   80 mg Oral Q M,W,F   insulin  aspart  0-5 Units Subcutaneous QHS   insulin  aspart  0-9 Units Subcutaneous TID WC   levothyroxine   175 mcg Oral QAC breakfast   memantine   10 mg Oral BID   sodium chloride  flush  3 mL Intravenous Q12H   Continuous Infusions:  heparin  1,300 Units/hr (11/28/23 0724)     LOS: 0 days    Time spent: over 30 min     Donnetta Gains, MD [facility]   To contact the attending provider between 7A-7P or the covering provider during after hours 7P-7A, please log into the web site [email or web address] and access using universal [place] password for that web site. If you do not have the password, please call the hospital  operator.  11/28/2023, 8:24 AM

## 2023-11-29 ENCOUNTER — Inpatient Hospital Stay (HOSPITAL_COMMUNITY)

## 2023-11-29 DIAGNOSIS — I2699 Other pulmonary embolism without acute cor pulmonale: Secondary | ICD-10-CM

## 2023-11-29 DIAGNOSIS — I639 Cerebral infarction, unspecified: Secondary | ICD-10-CM | POA: Diagnosis not present

## 2023-11-29 DIAGNOSIS — I69391 Dysphagia following cerebral infarction: Secondary | ICD-10-CM | POA: Diagnosis not present

## 2023-11-29 DIAGNOSIS — F0150 Vascular dementia without behavioral disturbance: Secondary | ICD-10-CM

## 2023-11-29 DIAGNOSIS — Z86711 Personal history of pulmonary embolism: Secondary | ICD-10-CM | POA: Diagnosis not present

## 2023-11-29 DIAGNOSIS — I6381 Other cerebral infarction due to occlusion or stenosis of small artery: Secondary | ICD-10-CM | POA: Diagnosis not present

## 2023-11-29 DIAGNOSIS — E1122 Type 2 diabetes mellitus with diabetic chronic kidney disease: Secondary | ICD-10-CM

## 2023-11-29 DIAGNOSIS — E785 Hyperlipidemia, unspecified: Secondary | ICD-10-CM

## 2023-11-29 DIAGNOSIS — I2510 Atherosclerotic heart disease of native coronary artery without angina pectoris: Secondary | ICD-10-CM

## 2023-11-29 DIAGNOSIS — E1151 Type 2 diabetes mellitus with diabetic peripheral angiopathy without gangrene: Secondary | ICD-10-CM

## 2023-11-29 DIAGNOSIS — I120 Hypertensive chronic kidney disease with stage 5 chronic kidney disease or end stage renal disease: Secondary | ICD-10-CM

## 2023-11-29 LAB — CBC WITH DIFFERENTIAL/PLATELET
Abs Immature Granulocytes: 0.08 10*3/uL — ABNORMAL HIGH (ref 0.00–0.07)
Basophils Absolute: 0 10*3/uL (ref 0.0–0.1)
Basophils Relative: 1 %
Eosinophils Absolute: 0.2 10*3/uL (ref 0.0–0.5)
Eosinophils Relative: 3 %
HCT: 27.3 % — ABNORMAL LOW (ref 36.0–46.0)
Hemoglobin: 8.4 g/dL — ABNORMAL LOW (ref 12.0–15.0)
Immature Granulocytes: 1 %
Lymphocytes Relative: 34 %
Lymphs Abs: 2.5 10*3/uL (ref 0.7–4.0)
MCH: 30.8 pg (ref 26.0–34.0)
MCHC: 30.8 g/dL (ref 30.0–36.0)
MCV: 100 fL (ref 80.0–100.0)
Monocytes Absolute: 0.6 10*3/uL (ref 0.1–1.0)
Monocytes Relative: 8 %
Neutro Abs: 3.9 10*3/uL (ref 1.7–7.7)
Neutrophils Relative %: 53 %
Platelets: 240 10*3/uL (ref 150–400)
RBC: 2.73 MIL/uL — ABNORMAL LOW (ref 3.87–5.11)
RDW: 16.2 % — ABNORMAL HIGH (ref 11.5–15.5)
WBC: 7.4 10*3/uL (ref 4.0–10.5)
nRBC: 0 % (ref 0.0–0.2)

## 2023-11-29 LAB — COMPREHENSIVE METABOLIC PANEL WITH GFR
ALT: 14 U/L (ref 0–44)
AST: 16 U/L (ref 15–41)
Albumin: 2.1 g/dL — ABNORMAL LOW (ref 3.5–5.0)
Alkaline Phosphatase: 43 U/L (ref 38–126)
Anion gap: 14 (ref 5–15)
BUN: 38 mg/dL — ABNORMAL HIGH (ref 8–23)
CO2: 27 mmol/L (ref 22–32)
Calcium: 8.4 mg/dL — ABNORMAL LOW (ref 8.9–10.3)
Chloride: 95 mmol/L — ABNORMAL LOW (ref 98–111)
Creatinine, Ser: 6.06 mg/dL — ABNORMAL HIGH (ref 0.44–1.00)
GFR, Estimated: 7 mL/min — ABNORMAL LOW (ref >60)
Glucose, Bld: 100 mg/dL — ABNORMAL HIGH (ref 70–99)
Potassium: 4.2 mmol/L (ref 3.5–5.1)
Sodium: 136 mmol/L (ref 135–145)
Total Bilirubin: 0.6 mg/dL (ref 0.0–1.2)
Total Protein: 5 g/dL — ABNORMAL LOW (ref 6.5–8.1)

## 2023-11-29 LAB — PHOSPHORUS: Phosphorus: 4.8 mg/dL — ABNORMAL HIGH (ref 2.5–4.6)

## 2023-11-29 LAB — HEMOGLOBIN A1C
Hgb A1c MFr Bld: 5.7 % — ABNORMAL HIGH (ref 4.8–5.6)
Mean Plasma Glucose: 116.89 mg/dL

## 2023-11-29 LAB — MAGNESIUM: Magnesium: 2.2 mg/dL (ref 1.7–2.4)

## 2023-11-29 LAB — GLUCOSE, CAPILLARY
Glucose-Capillary: 85 mg/dL (ref 70–99)
Glucose-Capillary: 65 mg/dL — ABNORMAL LOW (ref 70–99)
Glucose-Capillary: 64 mg/dL — ABNORMAL LOW (ref 70–99)
Glucose-Capillary: 130 mg/dL — ABNORMAL HIGH (ref 70–99)
Glucose-Capillary: 82 mg/dL (ref 70–99)

## 2023-11-29 LAB — HEPATITIS B SURFACE ANTIBODY, QUANTITATIVE: Hep B S AB Quant (Post): <3.5 m[IU]/mL — ABNORMAL LOW

## 2023-11-29 LAB — HEPARIN LEVEL (UNFRACTIONATED)
Heparin Unfractionated: 0.72 [IU]/mL — ABNORMAL HIGH (ref 0.30–0.70)
Heparin Unfractionated: 0.66 [IU]/mL (ref 0.30–0.70)

## 2023-11-29 LAB — LIPID PANEL
Cholesterol: 157 mg/dL (ref 0–200)
HDL: 67 mg/dL (ref >40)
LDL Cholesterol: 76 mg/dL (ref 0–99)
Total CHOL/HDL Ratio: 2.3 ratio
Triglycerides: 68 mg/dL (ref <150)
VLDL: 14 mg/dL (ref 0–40)

## 2023-11-29 MED ORDER — INSULIN ASPART 100 UNIT/ML IJ SOLN
0.0000 [IU] | Freq: Three times a day (TID) | INTRAMUSCULAR | Status: DC
  Administered 2023-11-29: 1 [IU] via SUBCUTANEOUS
  Administered 2023-12-02 (×2): 2 [IU] via SUBCUTANEOUS
  Administered 2023-12-02: 5 [IU] via SUBCUTANEOUS
  Administered 2023-12-03 (×2): 2 [IU] via SUBCUTANEOUS
  Administered 2023-12-03: 1 [IU] via SUBCUTANEOUS

## 2023-11-29 MED ORDER — INSULIN ASPART 100 UNIT/ML IJ SOLN: 0.0000 [IU] | Freq: Every day | INTRAMUSCULAR | Status: DC

## 2023-11-29 NOTE — Progress Notes (Signed)
 PHARMACY - ANTICOAGULATION  Pharmacy Consult for Heparin  Indication: pulmonary embolus   Allergies  Allergen Reactions   Glucophage [Metformin] Diarrhea   Tnkase  [Tenecteplase ] Other (See Comments)    Unknown reaction    Patient Measurements: Height: 5' 7 (170.2 cm) Weight: 92 kg (202 lb 13.2 oz) IBW/kg (Calculated) : 61.6 HEPARIN  DW (KG): 81.3  Vital Signs: Temp: 97.5 F (36.4 C) (06/18 1245) Temp Source: Axillary (06/18 0747) BP: 107/45 (06/18 1403) Pulse Rate: 85 (06/18 1403)  Labs: Recent Labs    11/27/23 0343 11/27/23 0358 11/27/23 0359 11/28/23 0532 11/28/23 1656 11/29/23 0548 11/29/23 1410  HGB 9.6* 9.9* 8.5* 7.5*  --  8.4*  --   HCT 30.8* 29.0* 25.0* 24.2*  --  27.3*  --   PLT 266  --   --  253  --  240  --   APTT 34  --   --   --   --   --   --   LABPROT 13.9  --   --   --   --   --   --   INR 1.1  --   --   --   --   --   --   HEPARINUNFRC  --   --   --   --  >1.10* 0.72* 0.66  CREATININE 5.64* 6.00*  --  4.59*  --  6.06*  --     Estimated Creatinine Clearance: 10.4 mL/min (A) (by C-G formula based on SCr of 6.06 mg/dL (H)).   Assessment: 68 y.o. female admitted with SOB found to have LLL PE for heparin .  Heparin  still slightly above goal. Will decrease rate and check confirm this PM.   Goal of Therapy:  Heparin  level 0.3-0.5 units/mL Monitor platelets by anticoagulation protocol: Yes   Plan:  Decrease Heparin  850 units/hr Check heparin  level in 8 hours.   Ivery Marking, PharmD, BCIDP, AAHIVP, CPP Infectious Disease Pharmacist 11/29/2023 3:07 PM

## 2023-11-29 NOTE — Progress Notes (Addendum)
 PHARMACY - ANTICOAGULATION  Pharmacy Consult for Heparin  Indication: pulmonary embolus Brief A/P: Heparin  level supratherapeutic Decrease Heparin  rate  Allergies  Allergen Reactions   Glucophage [Metformin] Diarrhea   Tnkase  [Tenecteplase ] Other (See Comments)    Unknown reaction    Patient Measurements: Height: 5' 7 (170.2 cm) Weight: 91.2 kg (201 lb 1 oz) IBW/kg (Calculated) : 61.6 HEPARIN  DW (KG): 81.3  Vital Signs: Temp: 98.2 F (36.8 C) (06/18 0553) Temp Source: Oral (06/18 0553) BP: 117/47 (06/17 2000) Pulse Rate: 88 (06/17 2000)  Labs: Recent Labs    11/27/23 0343 11/27/23 0358 11/27/23 0359 11/28/23 0532 11/28/23 1656 11/29/23 0548  HGB 9.6* 9.9* 8.5* 7.5*  --   --   HCT 30.8* 29.0* 25.0* 24.2*  --   --   PLT 266  --   --  253  --   --   APTT 34  --   --   --   --   --   LABPROT 13.9  --   --   --   --   --   INR 1.1  --   --   --   --   --   HEPARINUNFRC  --   --   --   --  >1.10* 0.72*  CREATININE 5.64* 6.00*  --  4.59*  --  6.06*    Estimated Creatinine Clearance: 10.3 mL/min (A) (by C-G formula based on SCr of 6.06 mg/dL (H)).   Assessment: 68 y.o. female admitted with SOB found to have LLL PE for heparin  Goal of Therapy:  Heparin  level 0.3-0.5 units/mL Monitor platelets by anticoagulation protocol: Yes   Plan:  Decrease Heparin  1000 units/hr Check heparin  level in 8 hours.   Carlota Chestnut 11/29/2023,6:51 AM

## 2023-11-29 NOTE — Progress Notes (Signed)
 PT Cancellation Note  Patient Details Name: Jennifer [name] MRN: [id number] DOB: 11/29/1955   Cancelled Treatment:      Pt in HD.  PT to check back later today as time allows.  Thanks,  Alveria Avena, PT, DPT  Acute Rehabilitation Secure chat is best for contact #[phone] office      Teresia Fennel 11/29/2023, 9:08 AM

## 2023-11-29 NOTE — Progress Notes (Signed)
 PT Cancellation Note  Patient Details Name: Jennifer [name] MRN: [id number] DOB: Jun 23, 1955   Cancelled Treatment:     Pt is still not in her room. PT to check back tomorrow.  Thanks,  Alveria Avena, PT, DPT  Acute Rehabilitation Secure chat is best for contact #[phone] office      Teresia Fennel 11/29/2023, 3:28 PM

## 2023-11-29 NOTE — Progress Notes (Addendum)
 STROKE TEAM PROGRESS NOTE   INTERIM HISTORY/SUBJECTIVE Seen and examined in dialysis.  She is lethargic but awakens to stimuli she is confused and is unable to answer any orientation questions Exam is nonfocal she has generalized weakness MRI brain with acute punctate lacunar infarct in the left periventricular white matter She is on heparin  drip for PE.  She can be transitioned to anticoagulation when able to take p.o.'s CBC    Component Value Date/Time   WBC 7.4 11/29/2023 0548   RBC 2.73 (L) 11/29/2023 0548   HGB 8.4 (L) 11/29/2023 0548   HGB 11.6 05/20/2020 1453   HCT 27.3 (L) 11/29/2023 0548   HCT 35.8 05/20/2020 1453   PLT 240 11/29/2023 0548   PLT 276 05/20/2020 1453   MCV 100.0 11/29/2023 0548   MCV 84 05/20/2020 1453   MCH 30.8 11/29/2023 0548   MCHC 30.8 11/29/2023 0548   RDW 16.2 (H) 11/29/2023 0548   RDW 13.5 05/20/2020 1453   LYMPHSABS 2.5 11/29/2023 0548   LYMPHSABS 1.7 05/20/2020 1453   MONOABS 0.6 11/29/2023 0548   EOSABS 0.2 11/29/2023 0548   EOSABS 0.1 05/20/2020 1453   BASOSABS 0.0 11/29/2023 0548   BASOSABS 0.1 05/20/2020 1453    BMET    Component Value Date/Time   NA 136 11/29/2023 0548   K 4.2 11/29/2023 0548   CL 95 (L) 11/29/2023 0548   CO2 27 11/29/2023 0548   GLUCOSE 100 (H) 11/29/2023 0548   BUN 38 (H) 11/29/2023 0548   CREATININE 6.06 (H) 11/29/2023 0548   CALCIUM  8.4 (L) 11/29/2023 0548   GFRNONAA 7 (L) 11/29/2023 0548    IMAGING past 24 hours DG Chest Port 1 View Result Date: 11/29/2023 CLINICAL DATA:  68 year old female with shortness of breath. Left lower lobe pulmonary emboli detected yesterday. EXAM: PORTABLE CHEST 1 VIEW COMPARISON:  CTA chest yesterday. FINDINGS: Portable AP semi upright view at 0633 hours. Lung volumes are stable and within normal limits. Stable cardiac size and mediastinal contours. Calcified aortic atherosclerosis. Streaky and confluent left lower lobe opacity better demonstrated by CTA yesterday. Lung  ventilation appears stable from 11/27/2023 radiographs. Visualized tracheal air column is within normal limits. No pneumothorax, edema. No definite pleural effusion. Paucity of bowel gas. Stable cholecystectomy clips. Stable visualized osseous structures. IMPRESSION: Stable ventilation. Left lower lobe pulmonary emboli and airspace opacity better demonstrated by CTA yesterday. No new cardiopulmonary abnormality. Electronically Signed   By: Marlise Simpers M.D.   On: 11/29/2023 06:59   MR ANGIO HEAD WO CONTRAST Result Date: 11/28/2023 CLINICAL DATA:  Follow-up examination for stroke. EXAM: MRA HEAD WITHOUT CONTRAST TECHNIQUE: Angiographic images of the Circle of Willis were acquired using MRA technique without intravenous contrast. COMPARISON:  Prior brain MRI from 11/27/2023 FINDINGS: Anterior circulation: Both internal carotid arteries are patent through the siphons without hemodynamically significant stenosis. No made of a tiny 1-2 mm outpouching arising from the supraclinoid left ICA (series 5, image 93). A small vessel is seen coursing from the apex of this outpouching, consistent with a small vascular infundibulum. A1 segments patent bilaterally. Normal anterior communicating artery complex. Anterior cerebral arteries patent without significant stenosis. No M1 stenosis or occlusion. Distal MCA branches perfused and symmetric. Posterior circulation: Vertebral arteries are largely codominant and patent without stenosis. Both PICA patent. Basilar patent without stenosis. Superior cerebral arteries patent bilaterally. Both PCAs primarily supplied via the basilar. Moderate bilateral P2 stenoses noted (series 1037, image 14). PCAs otherwise patent to their distal aspects. Anatomic variants: None significant. Other:  No intracranial aneurysm. IMPRESSION: 1. Negative intracranial MRA for large vessel occlusion. 2. Moderate bilateral P2 stenoses. 3. Otherwise wide patency of the major intracranial arterial circulation. No  other hemodynamically significant or correctable stenosis. Electronically Signed   By: Virgia Griffins M.D.   On: 11/28/2023 16:47   ECHOCARDIOGRAM LIMITED Result Date: 11/28/2023    ECHOCARDIOGRAM LIMITED REPORT   Patient Name:   Jennifer [name] Date of Exam: 11/28/2023 Medical Rec #:  [id number]     Height:       67.0 in Accession #:    [id number]    Weight:       201.1 lb Date of Birth:  1955/07/30    BSA:          2.027 m Patient Age:    68 years      BP:           134/49 mmHg Patient Gender: F             HR:           65 bpm. Exam Location:  Inpatient Procedure: Limited Echo (Both Spectral and Color Flow Doppler were utilized            during procedure). Indications:    Pulmonary Embolus I26.09  History:        Patient has prior history of Echocardiogram examinations, most                 recent 11/21/2023.  Sonographer:    Hersey Lorenzo RDCS Referring Phys: [phone] A CALDWELL POWELL JR IMPRESSIONS  1. Left ventricular ejection fraction, by estimation, is 60 to 65%. The left ventricle has normal function. The left ventricle has no regional wall motion abnormalities.  2. Right ventricular systolic function is normal. The right ventricular size is normal.  3. The mitral valve is normal in structure. No evidence of mitral valve regurgitation. No evidence of mitral stenosis.  4. The aortic valve is normal in structure. There is mild calcification of the aortic valve. Aortic valve regurgitation is not visualized. No aortic stenosis is present.  5. The inferior vena cava is normal in size with greater than 50% respiratory variability, suggesting right atrial pressure of 3 mmHg. FINDINGS  Left Ventricle: Left ventricular ejection fraction, by estimation, is 60 to 65%. The left ventricle has normal function. The left ventricle has no regional wall motion abnormalities. The left ventricular internal cavity size was normal in size. There is  no left ventricular hypertrophy. Right Ventricle: The right ventricular  size is normal. No increase in right ventricular wall thickness. Right ventricular systolic function is normal. Left Atrium: Left atrial size was normal in size. Right Atrium: Right atrial size was normal in size. Pericardium: There is no evidence of pericardial effusion. Mitral Valve: The mitral valve is normal in structure. No evidence of mitral valve stenosis. Tricuspid Valve: The tricuspid valve is normal in structure. Tricuspid valve regurgitation is not demonstrated. No evidence of tricuspid stenosis. Aortic Valve: The aortic valve is normal in structure. There is mild calcification of the aortic valve. Aortic valve regurgitation is not visualized. No aortic stenosis is present. Pulmonic Valve: The pulmonic valve was not assessed. Pulmonic valve regurgitation is not visualized. No evidence of pulmonic stenosis. Aorta: The aortic root is normal in size and structure. Venous: The inferior vena cava is normal in size with greater than 50% respiratory variability, suggesting right atrial pressure of 3 mmHg. IAS/Shunts: No atrial level shunt detected by color flow Doppler.  LEFT VENTRICLE PLAX 2D LVIDd:         5.00 cm LVIDs:         4.00 cm LV PW:         0.90 cm LV IVS:        1.00 cm  Aditya Sabharwal Electronically signed by Alwin Baars Signature Date/Time: 11/28/2023/3:21:14 PM    Final     Vitals:   11/29/23 1200 11/29/23 1230 11/29/23 1235 11/29/23 1245  BP: (!) 119/55 (!) 115/47 (!) 121/54 (!) 123/50  Pulse: 86 83 80 81  Resp: 16 13 15 11   Temp:    (!) 97.5 F (36.4 C)  TempSrc:      SpO2: 99% 96% 96% 100%  Weight:    92 kg  Height:         PHYSICAL EXAM General: Acutely ill Psych:  Mood and affect appropriate for situation CV: Regular rate and rhythm on monitor Respiratory:  Regular, unlabored respirations on room air GI: Abdomen soft and nontender   NEURO:  Mental Status: Lethargic arouses easily to stimulation.  She is confused and unable to answer any orientation questions  correctly Speech/Language: speech is without aphasia.  Mildly dysarthric.  Naming, repetition, fluency, and comprehension intact.  Cranial Nerves:  II: PERRL. Visual fields full.  III, IV, VI: EOMI. Eyelids elevate symmetrically.  V: Sensation is intact to light touch and symmetrical to face.  VII: Symmetric VIII: hearing intact to voice. IX, X: Palate elevates symmetrically. Phonation is normal.  ZO:[id number] shrug 5/5. XII: tongue is midline without fasciculations. Motor: Generalized weakness in all 4 extremities, all 4 extremities are antigravity and spontaneous Tone: is normal and bulk is normal Sensation- Intact to light touch bilaterally. Extinction absent to light touch to DSS.   Coordination: Unable to assess Gait- deferred   ASSESSMENT/PLAN  Jennifer [name] is a 68 y.o. 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  She was also found to be hypoxic, but this has improved.  She was found to have a pulmonary embolus and is now on anticoagulation with heparin .  MRI brain reveals punctate stroke in the left periventricular area.  NIH on Admission 11  Acute Ischemic Infarct:  left periventricular white matter  Etiology: Small vessel disease Code Stroke  CT head No acute abnormality.  Small vessel disease ASPECTS 10.     CTA head & neck no LVO. ntracranial atherosclerotic disease, most notably about the carotid siphons and PCAs where there are associated mild to moderate multifocal stenoses. MRI   punctate acute lacunar infarct involving the left periventricular white matter. Extensive cerebral white matter disease and chronic encephalomalacia  Numerous foci of hemosiderin staining primarily within the brainstem, periventricular white matter and mesial temporal  lobes. MRA no LVO Carotid Doppler ordered 2D Echo EF 60 to 65% Lower extremity Dopplers pending LDL 76 HgbA1c 6.3 VTE prophylaxis -heparin  drip aspirin  81 mg daily prior to admission, now on heparin  IV .  Will need to transition to DOAC for PE when patient able to take p.o. Therapy recommendations:  Pending Disposition: Pending  Hx of Stroke/TIA Vascular dementia Anxiety and depression history of left corpus callosum lacunar infarct in 2020   old right basal ganglia infarct in 2019.   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  On home Namenda   Acute PE Acute hypoxic respiratory failure Managed per primary team CT PE protocol with L lung lingular lobar and lower lobe basal segmetnal branch PE. Sattered patchy and confluent LLL opacity, possible pulmonary infarct and infection/bronchopneumonia  On ceftriaxone  and doxycycline  On heparin  drip LE ultrasound pending   Hypertension CAD Home meds: Carvedilol  12.5 mg, Lasix  80 mg Stable Blood Pressure Goal: SBP less than 160   Hyperlipidemia Home meds: Atorvastatin  80 mg, resumed in hospital LDL 76, goal < 70 Continue statin at discharge  Diabetes type II Controlled Home meds: Insulin  HgbA1c 6.3, goal < 7.0 CBGs SSI Recommend close follow-up with PCP for better DM control  Dysphagia Patient has post-stroke dysphagia, SLP consulted    Diet   Diet Carb Modified Fluid consistency: Thin; Room service appropriate? Yes   Advance diet as tolerated  End-stage renal disease on HD HD Monday Wednesday Friday Nephrology on board  Other Stroke Risk Factors Obesity, Body mass index is 31.77 kg/m., BMI >/= 30 associated with increased stroke risk, recommend weight loss, diet and exercise as appropriate  Coronary artery disease    Other Active Problems Hypothyroidism Anemia of chronic disease  Hospital day #  1  Jonette Nestle DNP, ACNPC-AG  [facility]  I have personally obtained history,examined this patient, reviewed notes, independently viewed imaging studies, participated in medical decision making and plan of care.ROS completed by me personally and pertinent positives fully documented  I have made any additions or clarifications directly to the above note. Agree with note above.  Patient was presented with an altered mental status initially felt to be related to hypoxia and hypoglycemia but subsequently found to have pulmonary embolus and is now on anticoagulation with heparin .  MRI scan was found to have a tiny punctate left periventricular white matter lacunar infarct likely clinically asymptomatic.  Continue ongoing stroke workup.  Aggressive risk factor modification.  Continue heparin  and transition to oral anticoagulation and patient can swallow safely.  No family available at the bedside.  Discussed with Dr. Zelda Hickman.  Greater than 50% time during this 50-minute visit was spent in counseling and coordination of care about her silent lacunar stroke discussion with care team and answering questions  Ardella Beaver, MD Medical Director [facility] Pager: [phone] 11/29/2023 2:54 PM  To contact Stroke Continuity provider, please refer to [email or web address]. After hours, contact General Neurology

## 2023-11-29 NOTE — Progress Notes (Signed)
 [facility] Progress Note   Assessment/ Plan:   1 Acute hypoxic RF: CXR negative.  ? Aspiration vs edema-- but would expect to see on CXR.  Regardless- she does look a little volume overloaded. CTA + for PE, on hep gtt now  on antibiotics too for CAP 2 ESRD: MWF for now at [facility]- will do 4 hr rx with UF first and then HD.  Will need to use butthonholes.  Next HD today 6/18  No heparin  with HD 3 Hypertension:  at her normal Bps now 4. Anemia of ESRD:  will dose mircera when indicated 5. Metabolic Bone Disease:  binders/ vits  6.  Dispo admitted  Subjective:    Sleeping, no needs expressed.      Objective:   BP (!) (P) 123/50   Pulse (P) 81   Temp (!) (P) 97.5 F (36.4 C)   Resp (P) 11   Ht 5' 7 (1.702 m)   Wt (P) 92 kg   SpO2 (P) 100%   BMI (P) 31.77 kg/m   Physical Exam: GEN nad, lying in bed HEENT EOMI PERRL NECK + JVD PULM clear anteriorly CV RRR ABD soft EXT 1+ anasarca NEURO AAO x 3 SKIN warm and dry ACCESS:  R AVF + T/b  Labs: BMET Recent Labs  Lab 11/23/23 0405 11/24/23 0407 11/25/23 0300 11/26/23 0252 11/27/23 0343 11/27/23 0358 11/27/23 0359 11/28/23 0532 11/29/23 0548  NA 135 138 136 134* 137 137 135 136 136  K 4.3 4.0 4.4 3.7 4.6 4.4 4.4 4.1 4.2  CL 97* 99 100 97* 98 96*  --  95* 95*  CO2 26 28 28 25 28   --   --  27 27  GLUCOSE 91 94 131* 124* 109* 108*  --  153* 100*  BUN 66* 36* 59* 33* 45* 45*  --  30* 38*  CREATININE 5.96* 3.78* 5.20* 4.02* 5.64* 6.00*  --  4.59* 6.06*  CALCIUM  7.9* 7.8* 7.9* 7.8* 8.6*  --   --  8.5* 8.4*  PHOS 6.0* 3.8 4.8* 3.4  --   --   --  3.6 4.8*   CBC Recent Labs  Lab 11/25/23 0300 11/25/23 0724 11/27/23 0343 11/27/23 0358 11/27/23 0359 11/28/23 0532 11/29/23 0548  WBC 7.0  --  11.4*  --   --  11.4* 7.4  NEUTROABS  --   --  10.0*  --   --   --  3.9  HGB 8.0*   < > 9.6* 9.9* 8.5* 7.5* 8.4*  HCT 26.5*   < > 30.8* 29.0* 25.0* 24.2* 27.3*  MCV 99.3  --  99.7  --   --  100.8* 100.0  PLT 219  --   266  --   --  253 240   < > = values in this interval not displayed.      Medications:      stroke: early stages of recovery book   Does not apply Once   atorvastatin   80 mg Oral QHS   buPROPion   150 mg Oral BID   carvedilol   12.5 mg Oral Q M,W,F   Chlorhexidine  Gluconate Cloth  6 each Topical Q0600   doxycycline   100 mg Oral Q12H   furosemide   80 mg Oral Q M,W,F   insulin  aspart  0-5 Units Subcutaneous QHS   insulin  aspart  0-9 Units Subcutaneous TID WC   levothyroxine   175 mcg Oral QAC breakfast   memantine   10 mg Oral BID   sodium  chloride flush  3 mL Intravenous Q12H     Leandra Pro MD 11/29/2023, 12:47 PM

## 2023-11-29 NOTE — Procedures (Signed)
 Patient seen and examined on Hemodialysis. The procedure was supervised and I have made appropriate changes. BP (!) 123/50   Pulse 81   Temp (!) 97.5 F (36.4 C)   Resp 11   Ht 5' 7 (1.702 m)   Wt 92 kg   SpO2 100%   BMI 31.77 kg/m   QB 400 mL/ min via butthonhole, UF goal 2L  Tolerating treatment without complaints at this time.   Leandra Pro MD [facility] [phone] 12:49 PM

## 2023-11-29 NOTE — Progress Notes (Signed)
   11/29/23 1245  Vitals  Temp (!) 97.5 F (36.4 C)  Pulse Rate 81  Resp 11  BP (!) 123/50  SpO2 100 %  O2 Device Room Air  Weight 92 kg  Type of Weight Post-Dialysis  Post Treatment  Dialyzer Clearance Clear  Hemodialysis Intake (mL) 0 mL  Liters Processed 67.6  Fluid Removed (mL) 2300 mL  Tolerated HD Treatment Yes  Post-Hemodialysis Comments tx terminated due to machine shut down error rinseback given  AVG/AVF Arterial Site Held (minutes) 10 minutes  AVG/AVF Venous Site Held (minutes) 10 minutes   Received patient in bed to unit.  Alert and oriented.  Informed consent signed and in chart.   TX duration:2hrs due to machine shut down  Patient tolerated well.  Transported back to the room  Alert, without acute distress.  Hand-off given to patient's nurse.   Access used: RAVF Access issues: none  Total UF removed: 2.3L Medication(s) given: none    Na'Shaminy T [facility]

## 2023-11-29 NOTE — Progress Notes (Signed)
 OT Cancellation Note  Patient Details Name: Jennifer [name] MRN: [id number] DOB: 12-Jul-1955   Cancelled Treatment:    Reason Eval/Treat Not Completed: Patient at procedure or test/ unavailable (HD)  Jonette Nestle 11/29/2023, 9:12 AM Avanell Leigh, OTR/L Acute Rehabilitation Services Office: [phone]

## 2023-11-29 NOTE — Inpatient Diabetes Management (Signed)
 Inpatient Diabetes Program Recommendations  AACE/ADA: New Consensus Statement on Inpatient Glycemic Control (2015)  Target Ranges:  Prepandial:   less than 140 mg/dL      Peak postprandial:   less than 180 mg/dL (1-2 hours)      Critically ill patients:  140 - 180 mg/dL   Lab Results  Component Value Date   GLUCAP 85 11/29/2023   HGBA1C 6.3 (H) 11/20/2023    Review of Glycemic Control  Latest Reference Range & Units 11/27/23 08:23 11/27/23 10:11 11/27/23 16:58 11/27/23 17:23 11/27/23 17:27 11/27/23 18:36 11/28/23 02:09 11/29/23 07:45  Glucose-Capillary 70 - 99 mg/dL 161 (H) 88 44 (LL) 17 (LL) 51 (L) 132 (H) 112 (H) 85   Diabetes history: DM 2 Outpatient Diabetes medications: 75/25 75/25 25-10-25   Current orders for Inpatient glycemic control:  0-9 units tid + hs  A1c 6.3% on 6/9  Pt ESRD on HD  May need more sensitive correction scale 0-6 units starting at 151 mg/dl  Pt from rehab facility and needs re dosing of insulin  due to renal function. A1c is lower indicating may potentially be experiencing hypoglycemia occasionally outpatient. Follow glucose trends for discharge needs to return to SNF. No needs identified.  Thanks,  Eloise Hake RN, MSN, BC-ADM Inpatient Diabetes Coordinator Team Pager [phone] (8a-5p)

## 2023-11-29 NOTE — Progress Notes (Signed)
 SLP Cancellation Note  Patient Details Name: Jennifer [name] MRN: [id number] DOB: 1955-10-13   Cancelled treatment:        SLP attempted to see Ms. Lucrecia Sables for a cognitive-linguistic evaluation, however patient in dialysis and unavailable. SLP will continue attempts as able.                                                                                             Dorla Gartner, M.A., CCC-SLP  Miquel Stacks A Axle Parfait 11/29/2023, 12:52 PM

## 2023-11-29 NOTE — Progress Notes (Addendum)
 PROGRESS NOTE    Jennifer [name]  NFA:[id number] DOB: 1956/02/06 DOA: 11/27/2023 PCP: Clemens Curt, MD  Chief Complaint  Patient presents with   Respiratory Distress    Brief Narrative:   MOSETTA [name] is a 68 y.o. female with medical history significant of hypertension, CAD, CVA, diabetes mellitus type 2, ESRD on HD, hypothyroidism, anxiety, depression, and memory loss  altered mental status and hypoglycemia.   Records note patient had just recently been hospitalized 6/9 - 6/12 for acute left-sided weakness without clear signs of CVA on MRI or seizure by EEG.  During hospital stay patient was noted to have E. coli and Proteus in urine cultures and started on Rocephin  6/12 completing course prior to discharge.  During her stay she was also transition to a Monday, Wednesday, Friday schedule for hemodialysis.   She was brought to the hospital from [facility] rehab facility after last night, she was noted to be altered and unresponsive with a low blood sugar level in the fifties. Her oxygen  saturation was also low, in the 60s, and she was given a Duoneb treatment, which only increased her oxygen  saturation to 80-81. She was lethargic and not responding well, leading to a call to emergency services.   Assessment & Plan:    Acute respiratory failure with hypoxia Patient was noted to be in respiratory distress with O2 saturations reported in the 60s on 5 L with improvement after being placed on 15 L nonrebreather.  O2 needs now resolved, but CT PE protocol with L lung lingular lobar and lower lobe basal segmetnal branch PE.  Sattered patchy and confluent LLL opacity, possible pulmonary infarct and infection/bronchopneumonia Treatment as below   Acute Pulmonary Embolism with Possible Pulmonary Infarct Heparin  gtt Echo, LE US  BNP and troponin pending (expect these will be elevated in setting of her ESRD)  Community Acquired Pneumonia Systemic Inflammatory Response Syndrome Hypothermia at  presentation, hypoxia (hypothermia potentially related to hypoglycemia) - SIRS related to hypoglycemia + encephalopathy  and PE vs sepsis due to pneumonia.    Ceftriaxone , doxycycline  to cover empirically for pneumonia.  MRSA PCR.  Urine strep, urine legionella if able to collect.  She wasn't coughing, will order sputum cx if coughing.   Follow pending blood cultures.  Pending UA if able to collect.    Acute metabolic encephalopathy History of Dementia Patient noted to be acutely altered and was not responding to staff at the facility like normal.  Symptoms likely secondary to hypoglycemia with possible contribution related to infection/hypoxia.  Delirium precautions - seems mildly confused this morning - will speak to family for collateral/baseline MRI brain with possible punctate acute lacunar infarct - will discuss with neurology, discussed with Dr. Janett Medin on 11/29/2023.   Hypoglycemia  Diabetes mellitus type 2 with hypoglycemia due to insulin  Hypoglycemia to 17 on admission Lantus  and sliding scale, dose adjusted monitor closely, may need down titration of her insulin  upon discharge  Lab Results  Component Value Date   HGBA1C 6.3 (H) 11/20/2023   CBG (last 3)  Recent Labs    11/27/23 1836 11/28/23 0209 11/29/23 0745  GLUCAP 132* 112* 85      ESRD on HD Patient currently on a Monday, Wednesday, Friday schedule.  - Nephrology consulted on need of hemodialysis.   Hypertension - Continue metoprolol  and furosemide  as tolerated   Hypothyroidism TSH noted to be within normal limits at 1.353 when checked on 11/20/2023 - Continue levothyroxine    Normocytic anemia Chronic.   Hb down to  7.5 this AM No si/sx bleeding, will check repeat Hb this PM   Right Shin Hematoma Outline, monitor closely with need for heparin  with PE No acute fx/dislocation on tib fib plain films R Knee plain films pending  History of CVA Hyperlipidemia - Continue aspirin  and atorvastatin    Memory  loss - Delirium precautions - Continue Namenda    Anxiety  - Continue current medication regimen  Lives with husband/daughter.  At baseline, before last hospitalization, could transfer, but no extended walking with assistive device.  Wheelchair bound for past year.     DVT prophylaxis: heparin  gtt Code Status: full Family Communication: none Disposition:   Status is: Observation The patient remains OBS appropriate and will d/c before 2 midnights.   Consultants:  Neurology Nephrology  Procedures:  none  Subjective:  Patient in bed, appears comfortable, denies any headache, no fever, no chest pain or pressure, no shortness of breath , no abdominal pain. No new focal weakness.   Objective: Vitals:   11/29/23 0747 11/29/23 0915 11/29/23 0930 11/29/23 1000  BP: (!) 129/53 (!) 122/55 (!) 129/59 134/61  Pulse: 80 78 79 84  Resp: 12 10 12 16   Temp: (!) 97.5 F (36.4 C) (!) 97.3 F (36.3 C)    TempSrc: Axillary     SpO2: 93% 99% 97% 100%  Weight:  94.2 kg    Height:        Intake/Output Summary (Last 24 hours) at 11/29/2023 1030 Last data filed at 11/28/2023 1644 Gross per 24 hour  Intake 343.54 ml  Output --  Net 343.54 ml   Filed Weights   11/27/23 0344 11/29/23 0915  Weight: 91.2 kg 94.2 kg    Examination:  General: sleepy does not want to talk, moving all 4 extremities,  Cardiovascular: RRR Lungs: unlabored Abdomen: Soft, nontender, nondistended  Neurological: Alert, but somewhat confused.  R facial droop. Moves all extremities 4. Cranial nerves II through XII grossly intact. Extremities: R shin hematoma, approximately 3 cm in diameter - asked RN to circle  Data Reviewed: I have personally reviewed following labs and imaging studies   Data Review:   Inpatient Medications  Scheduled Meds:   stroke: early stages of recovery book   Does not apply Once   atorvastatin   80 mg Oral QHS   buPROPion   150 mg Oral BID   carvedilol   12.5 mg Oral Q M,W,F    Chlorhexidine  Gluconate Cloth  6 each Topical Q0600   doxycycline   100 mg Oral Q12H   furosemide   80 mg Oral Q M,W,F   levothyroxine   175 mcg Oral QAC breakfast   memantine   10 mg Oral BID   sodium chloride  flush  3 mL Intravenous Q12H   Continuous Infusions:  cefTRIAXone  (ROCEPHIN )  IV 2 g (11/29/23 0543)   heparin  1,000 Units/hr (11/29/23 0715)   PRN Meds:.acetaminophen  **OR** acetaminophen , albuterol , lidocaine  (PF)  DVT Prophylaxis    Recent Labs  Lab 11/24/23 0407 11/25/23 0300 11/25/23 0724 11/27/23 0343 11/27/23 0358 11/27/23 0359 11/28/23 0532 11/29/23 0548  WBC 6.8 7.0  --  11.4*  --   --  11.4* 7.4  HGB 9.1* 8.0*   < > 9.6* 9.9* 8.5* 7.5* 8.4*  HCT 29.0* 26.5*   < > 30.8* 29.0* 25.0* 24.2* 27.3*  PLT 242 219  --  266  --   --  253 240  MCV 96.7 99.3  --  99.7  --   --  100.8* 100.0  MCH 30.3 30.0  --  31.1  --   --  31.3 30.8  MCHC 31.4 30.2  --  31.2  --   --  31.0 30.8  RDW 15.8* 15.7*  --  15.9*  --   --  15.9* 16.2*  LYMPHSABS  --   --   --  0.7  --   --   --  2.5  MONOABS  --   --   --  0.6  --   --   --  0.6  EOSABS  --   --   --  0.0  --   --   --  0.2  BASOSABS  --   --   --  0.1  --   --   --  0.0   < > = values in this interval not displayed.    Recent Labs  Lab 11/23/23 0405 11/24/23 0407 11/25/23 0300 11/26/23 0252 11/27/23 0343 11/27/23 0358 11/27/23 0359 11/27/23 0502 11/27/23 0739 11/28/23 0532 11/29/23 0548  NA 135 138 136 134* 137 137 135  --   --  136 136  K 4.3 4.0 4.4 3.7 4.6 4.4 4.4  --   --  4.1 4.2  CL 97* 99 100 97* 98 96*  --   --   --  95* 95*  CO2 26 28 28 25 28   --   --   --   --  27 27  ANIONGAP 12 11 8 12 11   --   --   --   --  14 14  GLUCOSE 91 94 131* 124* 109* 108*  --   --   --  153* 100*  BUN 66* 36* 59* 33* 45* 45*  --   --   --  30* 38*  CREATININE 5.96* 3.78* 5.20* 4.02* 5.64* 6.00*  --   --   --  4.59* 6.06*  AST  --   --   --   --  21  --   --   --   --   --  16  ALT  --   --   --   --  13  --   --   --    --   --  14  ALKPHOS  --   --   --   --  54  --   --   --   --   --  43  BILITOT  --   --   --   --  0.6  --   --   --   --   --  0.6  ALBUMIN 2.2* 2.2* 2.1* 2.3* 2.2*  --   --   --   --  2.3* 2.1*  DDIMER  --   --   --   --   --   --   --   --  1.14*  --   --   PROCALCITON  --   --   --   --  0.49  --   --   --   --   --   --   LATICACIDVEN  --   --   --   --   --   --  0.7  --   --   --   --   INR  --   --   --   --  1.1  --   --   --   --   --   --  AMMONIA  --   --   --   --   --   --   --  22  --   --   --   BNP  --   --   --   --  36.6  --   --   --   --  50.2  --   MG 2.2 2.1 2.3  --   --   --   --   --   --   --  2.2  PHOS 6.0* 3.8 4.8* 3.4  --   --   --   --   --  3.6 4.8*  CALCIUM  7.9* 7.8* 7.9* 7.8* 8.6*  --   --   --   --  8.5* 8.4*      Recent Labs  Lab 11/23/23 0405 11/24/23 0407 11/25/23 0300 11/26/23 0252 11/27/23 0343 11/27/23 0359 11/27/23 0502 11/27/23 0739 11/28/23 0532 11/29/23 0548  DDIMER  --   --   --   --   --   --   --  1.14*  --   --   PROCALCITON  --   --   --   --  0.49  --   --   --   --   --   LATICACIDVEN  --   --   --   --   --  0.7  --   --   --   --   INR  --   --   --   --  1.1  --   --   --   --   --   AMMONIA  --   --   --   --   --   --  22  --   --   --   BNP  --   --   --   --  36.6  --   --   --  50.2  --   MG 2.2 2.1 2.3  --   --   --   --   --   --  2.2  CALCIUM  7.9* 7.8* 7.9* 7.8* 8.6*  --   --   --  8.5* 8.4*    --------------------------------------------------------------------------------------------------------------- Lab Results  Component Value Date   CHOL 157 11/29/2023   HDL 67 11/29/2023   LDLCALC 76 11/29/2023   LDLDIRECT 154.2 11/04/2011   TRIG 68 11/29/2023   CHOLHDL 2.3 11/29/2023    Lab Results  Component Value Date   HGBA1C 6.3 (H) 11/20/2023   No results for input(s): TSH, T4TOTAL, FREET4, T3FREE, THYROIDAB in the last 72 hours. No results for input(s): VITAMINB12, FOLATE, FERRITIN,  TIBC, IRON, RETICCTPCT in the last 72 hours. ------------------------------------------------------------------------------------------------------------------ Cardiac Enzymes No results for input(s): CKMB, TROPONINI, MYOGLOBIN in the last 168 hours.  Invalid input(s): CK  Micro Results Recent Results (from the past 240 hours)  Urine Culture (for pregnant, neutropenic or urologic patients or patients with an indwelling urinary catheter)     Status: Abnormal   Collection Time: 11/20/23 10:15 PM   Specimen: In/Out Cath Urine  Result Value Ref Range Status   Specimen Description   Final    IN/OUT CATH URINE Performed at [facility], [address], [place], [place] [address]    Special Requests   Final    NONE Performed at [facility], [address] [place], [place] [address]    Culture (A)  Final    >=100,000 COLONIES/mL ESCHERICHIA  COLI 40,000 COLONIES/mL PROTEUS MIRABILIS    Report Status 11/23/2023 FINAL  Final   Organism ID, Bacteria ESCHERICHIA COLI (A)  Final   Organism ID, Bacteria PROTEUS MIRABILIS (A)  Final      Susceptibility   Escherichia coli - MIC*    AMPICILLIN >=32 RESISTANT Resistant     CEFAZOLIN  <=4 SENSITIVE Sensitive     CEFEPIME <=0.12 SENSITIVE Sensitive     CEFTRIAXONE  <=0.25 SENSITIVE Sensitive     CIPROFLOXACIN <=0.25 SENSITIVE Sensitive     GENTAMICIN >=16 RESISTANT Resistant     IMIPENEM <=0.25 SENSITIVE Sensitive     NITROFURANTOIN <=16 SENSITIVE Sensitive     TRIMETH/SULFA >=320 RESISTANT Resistant     AMPICILLIN/SULBACTAM 16 INTERMEDIATE Intermediate     PIP/TAZO <=4 SENSITIVE Sensitive ug/mL    * >=100,000 COLONIES/mL ESCHERICHIA COLI   Proteus mirabilis - MIC*    AMPICILLIN <=2 SENSITIVE Sensitive     CEFAZOLIN  8 SENSITIVE Sensitive     CEFEPIME <=0.12 SENSITIVE Sensitive     CEFTRIAXONE  <=0.25 SENSITIVE Sensitive     CIPROFLOXACIN <=0.25 SENSITIVE Sensitive     GENTAMICIN <=1 SENSITIVE Sensitive     IMIPENEM 2 SENSITIVE  Sensitive     NITROFURANTOIN 128 RESISTANT Resistant     TRIMETH/SULFA <=20 SENSITIVE Sensitive     AMPICILLIN/SULBACTAM <=2 SENSITIVE Sensitive     PIP/TAZO <=4 SENSITIVE Sensitive ug/mL    * 40,000 COLONIES/mL PROTEUS MIRABILIS  Culture, blood (routine x 2)     Status: None (Preliminary result)   Collection Time: 11/27/23  4:04 AM   Specimen: BLOOD  Result Value Ref Range Status   Specimen Description BLOOD BLOOD LEFT HAND  Final   Special Requests   Final    BOTTLES DRAWN AEROBIC ONLY Blood Culture adequate volume   Culture   Final    NO GROWTH 2 DAYS Performed at [facility], [address] [place], [place] [address]    Report Status PENDING  Incomplete  Culture, blood (routine x 2)     Status: None (Preliminary result)   Collection Time: 11/27/23  5:02 AM   Specimen: BLOOD  Result Value Ref Range Status   Specimen Description BLOOD BLOOD LEFT HAND AEROBIC BOTTLE ONLY  Final   Special Requests   Final    BOTTLES DRAWN AEROBIC ONLY Blood Culture adequate volume   Culture   Final    NO GROWTH 2 DAYS Performed at [facility], [address], [place], [place] [address]    Report Status PENDING  Incomplete    Radiology Reports  DG Chest Port 1 View Result Date: 11/29/2023 CLINICAL DATA:  68 year old female with shortness of breath. Left lower lobe pulmonary emboli detected yesterday. EXAM: PORTABLE CHEST 1 VIEW COMPARISON:  CTA chest yesterday. FINDINGS: Portable AP semi upright view at 0633 hours. Lung volumes are stable and within normal limits. Stable cardiac size and mediastinal contours. Calcified aortic atherosclerosis. Streaky and confluent left lower lobe opacity better demonstrated by CTA yesterday. Lung ventilation appears stable from 11/27/2023 radiographs. Visualized tracheal air column is within normal limits. No pneumothorax, edema. No definite pleural effusion. Paucity of bowel gas. Stable cholecystectomy clips. Stable visualized osseous structures.  IMPRESSION: Stable ventilation. Left lower lobe pulmonary emboli and airspace opacity better demonstrated by CTA yesterday. No new cardiopulmonary abnormality. Electronically Signed   By: Marlise Simpers M.D.   On: 11/29/2023 06:59   MR ANGIO HEAD WO CONTRAST Result Date: 11/28/2023 CLINICAL DATA:  Follow-up examination for stroke. EXAM:  MRA HEAD WITHOUT CONTRAST TECHNIQUE: Angiographic images of the Circle of Willis were acquired using MRA technique without intravenous contrast. COMPARISON:  Prior brain MRI from 11/27/2023 FINDINGS: Anterior circulation: Both internal carotid arteries are patent through the siphons without hemodynamically significant stenosis. No made of a tiny 1-2 mm outpouching arising from the supraclinoid left ICA (series 5, image 93). A small vessel is seen coursing from the apex of this outpouching, consistent with a small vascular infundibulum. A1 segments patent bilaterally. Normal anterior communicating artery complex. Anterior cerebral arteries patent without significant stenosis. No M1 stenosis or occlusion. Distal MCA branches perfused and symmetric. Posterior circulation: Vertebral arteries are largely codominant and patent without stenosis. Both PICA patent. Basilar patent without stenosis. Superior cerebral arteries patent bilaterally. Both PCAs primarily supplied via the basilar. Moderate bilateral P2 stenoses noted (series 1037, image 14). PCAs otherwise patent to their distal aspects. Anatomic variants: None significant. Other: No intracranial aneurysm. IMPRESSION: 1. Negative intracranial MRA for large vessel occlusion. 2. Moderate bilateral P2 stenoses. 3. Otherwise wide patency of the major intracranial arterial circulation. No other hemodynamically significant or correctable stenosis. Electronically Signed   By: Virgia Griffins M.D.   On: 11/28/2023 16:47   ECHOCARDIOGRAM LIMITED Result Date: 11/28/2023    ECHOCARDIOGRAM LIMITED REPORT   Patient Name:   Jennifer [name] Date  of Exam: 11/28/2023 Medical Rec #:  [id number]     Height:       67.0 in Accession #:    [id number]    Weight:       201.1 lb Date of Birth:  Jan 20, 1956    BSA:          2.027 m Patient Age:    67 years      BP:           134/49 mmHg Patient Gender: F             HR:           65 bpm. Exam Location:  Inpatient Procedure: Limited Echo (Both Spectral and Color Flow Doppler were utilized            during procedure). Indications:    Pulmonary Embolus I26.09  History:        Patient has prior history of Echocardiogram examinations, most                 recent 11/21/2023.  Sonographer:    Hersey Lorenzo RDCS Referring Phys: [phone] A CALDWELL POWELL JR IMPRESSIONS  1. Left ventricular ejection fraction, by estimation, is 60 to 65%. The left ventricle has normal function. The left ventricle has no regional wall motion abnormalities.  2. Right ventricular systolic function is normal. The right ventricular size is normal.  3. The mitral valve is normal in structure. No evidence of mitral valve regurgitation. No evidence of mitral stenosis.  4. The aortic valve is normal in structure. There is mild calcification of the aortic valve. Aortic valve regurgitation is not visualized. No aortic stenosis is present.  5. The inferior vena cava is normal in size with greater than 50% respiratory variability, suggesting right atrial pressure of 3 mmHg. FINDINGS  Left Ventricle: Left ventricular ejection fraction, by estimation, is 60 to 65%. The left ventricle has normal function. The left ventricle has no regional wall motion abnormalities. The left ventricular internal cavity size was normal in size. There is  no left ventricular hypertrophy. Right Ventricle: The right ventricular size is normal. No increase in right ventricular wall thickness.  Right ventricular systolic function is normal. Left Atrium: Left atrial size was normal in size. Right Atrium: Right atrial size was normal in size. Pericardium: There is no evidence of  pericardial effusion. Mitral Valve: The mitral valve is normal in structure. No evidence of mitral valve stenosis. Tricuspid Valve: The tricuspid valve is normal in structure. Tricuspid valve regurgitation is not demonstrated. No evidence of tricuspid stenosis. Aortic Valve: The aortic valve is normal in structure. There is mild calcification of the aortic valve. Aortic valve regurgitation is not visualized. No aortic stenosis is present. Pulmonic Valve: The pulmonic valve was not assessed. Pulmonic valve regurgitation is not visualized. No evidence of pulmonic stenosis. Aorta: The aortic root is normal in size and structure. Venous: The inferior vena cava is normal in size with greater than 50% respiratory variability, suggesting right atrial pressure of 3 mmHg. IAS/Shunts: No atrial level shunt detected by color flow Doppler. LEFT VENTRICLE PLAX 2D LVIDd:         5.00 cm LVIDs:         4.00 cm LV PW:         0.90 cm LV IVS:        1.00 cm  Aditya Sabharwal Electronically signed by Alwin Baars Signature Date/Time: 11/28/2023/3:21:14 PM    Final    CT Angio Chest Pulmonary Embolism (PE) W or WO Contrast Addendum Date: 11/28/2023 ADDENDUM REPORT: 11/28/2023 07:13 ADDENDUM: Study discussed by telephone with Dr. [facility] Lavender on 11/28/2023 at 0644 hours. Electronically Signed   By: Marlise Simpers M.D.   On: 11/28/2023 07:13   Result Date: 11/28/2023 CLINICAL DATA:  68 year old female with respiratory distress. EXAM: CT ANGIOGRAPHY CHEST WITH CONTRAST TECHNIQUE: Multidetector CT imaging of the chest was performed using the standard protocol during bolus administration of intravenous contrast. Multiplanar CT image reconstructions and MIPs were obtained to evaluate the vascular anatomy. RADIATION DOSE REDUCTION: This exam was performed according to the departmental dose-optimization program which includes automated exposure control, adjustment of the mA and/or kV according to patient size and/or use of iterative  reconstruction technique. CONTRAST:  75mL OMNIPAQUE  IOHEXOL  350 MG/ML SOLN COMPARISON:  Portable chest yesterday. CT Abdomen and Pelvis 03/22/2022. FINDINGS: Cardiovascular: Adequate contrast bolus timing in the pulmonary arterial tree. Mild respiratory motion. There is low-density pulmonary artery embolus in the left lung, lingula lobar branch series 7, image 188. But no other lobar pulmonary embolus in the left lung. Basal segmental pulmonary artery detail is limited by respiratory motion but there does appear to be posterior basal segmental pulmonary embolus on series 7, image 278, and regional abnormal lung parenchyma there (see below). No central or saddle embolus. No contralateral right lung pulmonary artery filling defect is identified. Calcified aortic atherosclerosis. Calcified coronary artery atherosclerosis. Heart size within normal limits, stable, no pericardial effusion. RV LV ratio appears to remain within normal limits. Mediastinum/Nodes: Negative for mediastinal mass or lymphadenopathy. Lungs/Pleura: Major airways are patent. There is abnormal left lower lobe opacity which is both scattered, nodular, peribronchial, and also confluent in the posterior basal segment on series 6, image 102. No pleural effusion superimposed. No lingula pulmonary infarct or confluent opacity. Contralateral streaky right costophrenic angle opacity most resembles atelectasis, and mild scarring was also present there on the 2023 comparison. No right pleural effusion. Upper Abdomen: Negative visible early contrasted appearance of the liver, spleen, pancreas, adrenal glands, left kidney, and bowel in the upper abdomen. Musculoskeletal: Flowing thoracic endplate osteophytes resulting in some levels of interbody ankylosis. No acute or suspicious osseous  lesion identified. Review of the MIP images confirms the above findings. IMPRESSION: 1. Positive for Left lung Lingula lobar and Lower lobe basal segmental branch Pulmonary  emboli. No convincing CTA evidence of right heart strain. 2. No pleural effusion. No lingula infarct. Scattered, patchy, and confluent left lower lobe opacity which may be a combination of pulmonary infarct and infection/Bronchopneumonia. 3. Calcified coronary artery and Aortic Atherosclerosis (ICD10-I70.0). Electronically Signed: By: Marlise Simpers M.D. On: 11/28/2023 06:33   DG Tibia/Fibula Right Result Date: 11/27/2023 CLINICAL DATA:  Right knee and lower leg pain. EXAM: RIGHT TIBIA AND FIBULA - 2 VIEW COMPARISON:  11/27/2023. FINDINGS: There is no evidence of acute fracture or dislocation. Total knee arthroplasty changes are noted without evidence of hardware loosening. Vascular calcifications are noted in the soft tissues. Soft tissue thickening is noted anterior to the mid tibial shaft. IMPRESSION: No acute fracture or dislocation. Electronically Signed   By: Wyvonnia Heimlich M.D.   On: 11/27/2023 20:46   MR BRAIN WO CONTRAST Result Date: 11/27/2023 CLINICAL DATA:  Mental status change, unknown cause Neuro deficit, acute, stroke suspected EXAM: MRI HEAD WITHOUT CONTRAST TECHNIQUE: Multiplanar, multiecho pulse sequences of the brain and surrounding structures were obtained without intravenous contrast. COMPARISON:  CT of the head dated November 27, 2023. FINDINGS: Brain: There is a punctate focus of high diffusion signal present along the medial surface of the anterior horn of the left lateral ventricle seen on image 27 of series 2. The diffusion-weighted images are otherwise unremarkable. There is global cerebral volume loss, which is advanced for the patient's age. There is also extensive diffuse cerebral white matter disease. Chronic encephalomalacia changes are noted within the left cerebellar hemisphere. There is no evidence of acute hemorrhage, mass or hydrocephalus. There are numerous blooming artifacts present within the brainstem, periventricular white matter, mesial temporal lobes and the cerebellum.  Vascular: Normal flow voids. Skull and upper cervical spine: Normal bone marrow signal. Sinuses/Orbits: Status post bilateral lens replacement. The paranasal sinuses are clear. Other: None. IMPRESSION: 1. Questionable punctate acute lacunar infarct involving the left periventricular white matter. 2. Extensive cerebral white matter disease and chronic encephalomalacia changes within the left cerebellar hemisphere. 3. Numerous foci of hemosiderin staining primarily within the brainstem, periventricular white matter and mesial temporal lobes. Electronically Signed   By: Maribeth Shivers M.D.   On: 11/27/2023 13:56      Signature  -   Lynnwood Sauer M.D on 11/29/2023 at 10:31 AM   -  To page go to [email or web address]

## 2023-11-30 ENCOUNTER — Ambulatory Visit: Admitting: Internal Medicine

## 2023-11-30 DIAGNOSIS — I6381 Other cerebral infarction due to occlusion or stenosis of small artery: Secondary | ICD-10-CM | POA: Diagnosis not present

## 2023-11-30 DIAGNOSIS — I2699 Other pulmonary embolism without acute cor pulmonale: Secondary | ICD-10-CM | POA: Diagnosis not present

## 2023-11-30 DIAGNOSIS — I69391 Dysphagia following cerebral infarction: Secondary | ICD-10-CM | POA: Diagnosis not present

## 2023-11-30 DIAGNOSIS — E1151 Type 2 diabetes mellitus with diabetic peripheral angiopathy without gangrene: Secondary | ICD-10-CM | POA: Diagnosis not present

## 2023-11-30 DIAGNOSIS — F0150 Vascular dementia without behavioral disturbance: Secondary | ICD-10-CM | POA: Diagnosis not present

## 2023-11-30 LAB — GLUCOSE, CAPILLARY
Glucose-Capillary: 102 mg/dL — ABNORMAL HIGH (ref 70–99)
Glucose-Capillary: 139 mg/dL — ABNORMAL HIGH (ref 70–99)
Glucose-Capillary: 114 mg/dL — ABNORMAL HIGH (ref 70–99)
Glucose-Capillary: 176 mg/dL — ABNORMAL HIGH (ref 70–99)
Glucose-Capillary: 176 mg/dL — ABNORMAL HIGH (ref 70–99)

## 2023-11-30 LAB — RENAL FUNCTION PANEL
Albumin: 2.3 g/dL — ABNORMAL LOW (ref 3.5–5.0)
Anion gap: 14 (ref 5–15)
BUN: 27 mg/dL — ABNORMAL HIGH (ref 8–23)
CO2: 27 mmol/L (ref 22–32)
Calcium: 8.5 mg/dL — ABNORMAL LOW (ref 8.9–10.3)
Chloride: 97 mmol/L — ABNORMAL LOW (ref 98–111)
Creatinine, Ser: 4.54 mg/dL — ABNORMAL HIGH (ref 0.44–1.00)
GFR, Estimated: 10 mL/min — ABNORMAL LOW (ref >60)
Glucose, Bld: 131 mg/dL — ABNORMAL HIGH (ref 70–99)
Phosphorus: 4 mg/dL (ref 2.5–4.6)
Potassium: 4.5 mmol/L (ref 3.5–5.1)
Sodium: 138 mmol/L (ref 135–145)

## 2023-11-30 LAB — CBC
HCT: 30.1 % — ABNORMAL LOW (ref 36.0–46.0)
Hemoglobin: 9.1 g/dL — ABNORMAL LOW (ref 12.0–15.0)
MCH: 30.8 pg (ref 26.0–34.0)
MCHC: 30.2 g/dL (ref 30.0–36.0)
MCV: 102 fL — ABNORMAL HIGH (ref 80.0–100.0)
Platelets: 311 10*3/uL (ref 150–400)
RBC: 2.95 MIL/uL — ABNORMAL LOW (ref 3.87–5.11)
RDW: 16.1 % — ABNORMAL HIGH (ref 11.5–15.5)
WBC: 7.3 10*3/uL (ref 4.0–10.5)
nRBC: 0 % (ref 0.0–0.2)

## 2023-11-30 LAB — HEPARIN LEVEL (UNFRACTIONATED): Heparin Unfractionated: 0.23 [IU]/mL — ABNORMAL LOW (ref 0.30–0.70)

## 2023-11-30 MED ORDER — APIXABAN 5 MG PO TABS
10.0000 mg | ORAL_TABLET | Freq: Two times a day (BID) | ORAL | Status: DC
  Administered 2023-12-01 – 2023-12-04 (×6): 10 mg via ORAL
  Filled 2023-11-30 (×9): qty 2

## 2023-11-30 MED ORDER — EZETIMIBE 10 MG PO TABS
10.0000 mg | ORAL_TABLET | Freq: Every day | ORAL | Status: DC
  Administered 2023-12-02 – 2023-12-04 (×3): 10 mg via ORAL
  Filled 2023-11-30 (×5): qty 1

## 2023-11-30 MED ORDER — APIXABAN 5 MG PO TABS: 5.0000 mg | ORAL_TABLET | Freq: Two times a day (BID) | ORAL | Status: DC

## 2023-11-30 MED ORDER — APIXABAN 5 MG PO TABS: 10.0000 mg | ORAL_TABLET | Freq: Two times a day (BID) | ORAL | Status: DC

## 2023-11-30 NOTE — Evaluation (Signed)
 Physical Therapy Evaluation Patient Details Name: Jennifer [name] MRN: [id number] DOB: [birth year] Today's Date: 11/30/2023  History of Present Illness  68 y.o. female with medical history significant of hypertension, CAD, CVA, diabetes mellitus type 2, ESRD on HD, hypothyroidism, anxiety, depression, and memory loss  altered mental status and hypoglycemia.  Recently been hospitalized 6/9 - 6/12 for acute left-sided weakness without clear signs of CVA on MRI.  Adm 6/16 altered and unresponsive.  Pulmonary embolism involving the left side of the lung.  Clinical Impression  Pt presents with admitting diagnosis above. Co-treat with OT. Pt today was able to transfer into chair with +2 Max A with pt unable to take any steps. PTA pt was at [facility] for rehab following CVA. Patient will benefit from continued inpatient follow up therapy, <3 hours/day. PT will continue to follow.         If plan is discharge home, recommend the following: A lot of help with bathing/dressing/bathroom;A lot of help with walking and/or transfers;Help with stairs or ramp for entrance;Assistance with cooking/housework   Can travel by private vehicle   No    Equipment Recommendations None recommended by PT  Recommendations for Other Services       Functional Status Assessment Patient has had a recent decline in their functional status and demonstrates the ability to make significant improvements in function in a reasonable and predictable amount of time.     Precautions / Restrictions Precautions Precautions: Fall Recall of Precautions/Restrictions: Impaired Restrictions Weight Bearing Restrictions Per Provider Order: No      Mobility  Bed Mobility Overal bed mobility: Needs Assistance Bed Mobility: Supine to Sit     Supine to sit: Max assist          Transfers Overall transfer level: Needs assistance Equipment used: Rolling walker (2 wheels) Transfers: Sit to/from Stand, Bed to  chair/wheelchair/BSC Sit to Stand: Mod assist, +2 physical assistance Stand pivot transfers: Max assist, +2 physical assistance              Ambulation/Gait               General Gait Details: Deferred for safety  Stairs            Wheelchair Mobility     Tilt Bed    Modified Rankin (Stroke Patients Only)       Balance Overall balance assessment: Needs assistance Sitting-balance support: Feet supported, No upper extremity supported Sitting balance-Leahy Scale: Fair     Standing balance support: Bilateral upper extremity supported Standing balance-Leahy Scale: Poor                               Pertinent Vitals/Pain Pain Assessment Pain Assessment: No/denies pain    Home Living Family/patient expects to be discharged to:: Skilled nursing facility                   Additional Comments: Pt was at [facility] for rehab prior to admission    Prior Function Prior Level of Function : Needs assist;History of Falls (last six months);Patient poor historian/Family not available             Mobility Comments: +2 for short bouts of mobility with rehab staff ADLs Comments: Assist for all ADL's assmue mixture of bedlevel and seated.     Extremity/Trunk Assessment   Upper Extremity Assessment RUE Deficits / Details: Grossly 3+/5 RUE Coordination: decreased fine motor;decreased gross motor LUE  Deficits / Details: Grossly 3+/5 LUE Coordination: decreased fine motor;decreased gross motor    Lower Extremity Assessment Lower Extremity Assessment: Generalized weakness    Cervical / Trunk Assessment Cervical / Trunk Assessment: Kyphotic  Communication   Communication Communication: No apparent difficulties    Cognition Arousal: Alert Behavior During Therapy: Flat affect                             Following commands: Impaired Following commands impaired: Follows one step commands with increased time     Cueing  Cueing Techniques: Verbal cues, Tactile cues     General Comments General comments (skin integrity, edema, etc.): VSS    Exercises     Assessment/Plan    PT Assessment Patient needs continued PT services  PT Problem List Decreased strength;Decreased activity tolerance;Decreased balance;Decreased mobility       PT Treatment Interventions      PT Goals (Current goals can be found in the Care Plan section)  Acute Rehab PT Goals Patient Stated Goal: return home PT Goal Formulation: With patient Time For Goal Achievement: 12/14/23 Potential to Achieve Goals: Fair    Frequency Min 2X/week     Co-evaluation PT/OT/SLP Co-Evaluation/Treatment: Yes Reason for Co-Treatment: Complexity of the patient's impairments (multi-system involvement);For patient/therapist safety PT goals addressed during session: Mobility/safety with mobility;Balance;Proper use of DME OT goals addressed during session: ADL's and self-care       AM-PAC PT 6 Clicks Mobility  Outcome Measure Help needed turning from your back to your side while in a flat bed without using bedrails?: A Lot Help needed moving from lying on your back to sitting on the side of a flat bed without using bedrails?: A Lot Help needed moving to and from a bed to a chair (including a wheelchair)?: Total Help needed standing up from a chair using your arms (e.g., wheelchair or bedside chair)?: Total Help needed to walk in hospital room?: Total Help needed climbing 3-5 steps with a railing? : Total 6 Click Score: 8    End of Session Equipment Utilized During Treatment: Gait belt Activity Tolerance: Patient tolerated treatment well;Patient limited by fatigue Patient left: in chair;with call bell/phone within reach;with chair alarm set Nurse Communication: Mobility status PT Visit Diagnosis: Unsteadiness on feet (R26.81);Other abnormalities of gait and mobility (R26.89);Muscle weakness (generalized) (M62.81)    Time: 4098-1191 PT  Time Calculation (min) (ACUTE ONLY): 19 min   Charges:   PT Evaluation $PT Eval Moderate Complexity: 1 Mod   PT General Charges $$ ACUTE PT VISIT: 1 Visit         Rodgers Clack, PT, DPT Acute Rehab Services [phone]   Sarie Stall 11/30/2023, 4:59 PM

## 2023-11-30 NOTE — Progress Notes (Signed)
 [facility] Progress Note   Assessment/ Plan:   1 Acute hypoxic RF: CXR negative.  ? Aspiration vs edema-- but would expect to see on CXR.  Regardless- she does look a little volume overloaded. CTA + for PE, on antibiotics too for CAP On Eliquis  for PE 2 ESRD: MWF for now at [facility]- will do 4 hr rx with UF first and then HD.  Will need to use butthonholes.  HD 6/18, next 6/20  No heparin  with HD 3 Hypertension:  at her normal Bps now 4. Anemia of ESRD:  will dose mircera when indicated 5. Metabolic Bone Disease:  binders/ vits  6.  Dispo admitted  Subjective:    Sleeping, no needs expressed.      Objective:   BP (!) 159/65 (BP Location: Left Arm)   Pulse 94   Temp (!) 97.5 F (36.4 C) (Axillary)   Resp 19   Ht 5' 7 (1.702 m)   Wt 92 kg   SpO2 95%   BMI 31.77 kg/m   Physical Exam: GEN nad, lying in bed HEENT EOMI PERRL NECK + JVD PULM clear anteriorly CV RRR ABD soft EXT 1+ anasarca NEURO AAO x 3 SKIN warm and dry ACCESS:  R AVF + T/b  Labs: BMET Recent Labs  Lab 11/24/23 0407 11/25/23 0300 11/26/23 0252 11/27/23 0343 11/27/23 0358 11/27/23 0359 11/28/23 0532 11/29/23 0548 11/30/23 0608  NA 138 136 134* 137 137 135 136 136 138  K 4.0 4.4 3.7 4.6 4.4 4.4 4.1 4.2 4.5  CL 99 100 97* 98 96*  --  95* 95* 97*  CO2 28 28 25 28   --   --  27 27 27   GLUCOSE 94 131* 124* 109* 108*  --  153* 100* 131*  BUN 36* 59* 33* 45* 45*  --  30* 38* 27*  CREATININE 3.78* 5.20* 4.02* 5.64* 6.00*  --  4.59* 6.06* 4.54*  CALCIUM  7.8* 7.9* 7.8* 8.6*  --   --  8.5* 8.4* 8.5*  PHOS 3.8 4.8* 3.4  --   --   --  3.6 4.8* 4.0   CBC Recent Labs  Lab 11/27/23 0343 11/27/23 0358 11/27/23 0359 11/28/23 0532 11/29/23 0548 11/30/23 0608  WBC 11.4*  --   --  11.4* 7.4 7.3  NEUTROABS 10.0*  --   --   --  3.9  --   HGB 9.6*   < > 8.5* 7.5* 8.4* 9.1*  HCT 30.8*   < > 25.0* 24.2* 27.3* 30.1*  MCV 99.7  --   --  100.8* 100.0 102.0*  PLT 266  --   --  253 240 311   < > =  values in this interval not displayed.      Medications:     apixaban   10 mg Oral BID   Followed by   Cecily Cohen ON 12/05/2023] apixaban   5 mg Oral BID   atorvastatin   80 mg Oral QHS   buPROPion   150 mg Oral BID   carvedilol   12.5 mg Oral Q M,W,F   Chlorhexidine  Gluconate Cloth  6 each Topical Q0600   doxycycline   100 mg Oral Q12H   ezetimibe   10 mg Oral Daily   furosemide   80 mg Oral Q M,W,F   insulin  aspart  0-9 Units Subcutaneous TID WC   levothyroxine   175 mcg Oral QAC breakfast   memantine   10 mg Oral BID   sodium chloride  flush  3 mL Intravenous Q12H     Nellie Banas  Christianne Cowper MD 11/30/2023, 11:44 AM

## 2023-11-30 NOTE — Plan of Care (Signed)
  Problem: Ischemic Stroke/TIA Tissue Perfusion: Goal: Complications of ischemic stroke/TIA will be minimized 11/30/2023 0355 by Albertha Alosa, RN Outcome: Progressing 11/30/2023 0042 by Albertha Alosa, RN Outcome: Progressing

## 2023-11-30 NOTE — Evaluation (Signed)
 Occupational Therapy Evaluation Patient Details Name: Jennifer [name] MRN: [id number] DOB: 20-Dec-1955 Today's Date: 11/30/2023   History of Present Illness   68 y.o. female with medical history significant of hypertension, CAD, CVA, diabetes mellitus type 2, ESRD on HD, hypothyroidism, anxiety, depression, and memory loss  altered mental status and hypoglycemia.  Recently been hospitalized 6/9 - 6/12 for acute left-sided weakness without clear signs of CVA on MRI.  Adm 6/16 altered and unresponsive.  Pulmonary embolism involving the left side of the lung.     Clinical Impressions Patient admitted for the diagnosis above.  PTA she was at a local SNF undergoing rehab post CVA.  Patient is still needing +2 for mobility, but has progressed upper body ADL to seated with Min A.  OT can continue efforts in the acute setting to address deficits, and Patient will benefit from continued inpatient follow up therapy, <3 hours/day.     If plan is discharge home, recommend the following:   A lot of help with walking and/or transfers;Two people to help with walking and/or transfers;A lot of help with bathing/dressing/bathroom;Assistance with cooking/housework;Assistance with feeding;Direct supervision/assist for medications management;Assist for transportation;Help with stairs or ramp for entrance     Functional Status Assessment   Patient has had a recent decline in their functional status and demonstrates the ability to make significant improvements in function in a reasonable and predictable amount of time.     Equipment Recommendations   None recommended by OT     Recommendations for Other Services         Precautions/Restrictions   Precautions Precautions: Fall Recall of Precautions/Restrictions: Impaired Restrictions Weight Bearing Restrictions Per Provider Order: No     Mobility Bed Mobility   Bed Mobility: Supine to Sit     Supine to sit: Max assist       Patient  Response: Cooperative  Transfers Overall transfer level: Needs assistance Equipment used: Rolling walker (2 wheels) Transfers: Sit to/from Stand, Bed to chair/wheelchair/BSC Sit to Stand: Mod assist, +2 physical assistance Stand pivot transfers: Max assist, +2 physical assistance                Balance Overall balance assessment: Needs assistance Sitting-balance support: Feet supported, No upper extremity supported Sitting balance-Leahy Scale: Fair     Standing balance support: Bilateral upper extremity supported Standing balance-Leahy Scale: Poor                             ADL either performed or assessed with clinical judgement   ADL Overall ADL's : Needs assistance/impaired Eating/Feeding: Sitting;Minimal assistance   Grooming: Sitting;Minimal assistance   Upper Body Bathing: Minimal assistance;Sitting   Lower Body Bathing: Maximal assistance;Bed level   Upper Body Dressing : Moderate assistance;Sitting   Lower Body Dressing: Maximal assistance;Bed level   Toilet Transfer: Maximal assistance;Total assistance;+2 for physical assistance;Stand-pivot   Toileting- Clothing Manipulation and Hygiene: Total assistance;Bed level               Vision   Vision Assessment?: No apparent visual deficits     Perception Perception: Not tested       Praxis Praxis: Not tested       Pertinent Vitals/Pain Pain Assessment Pain Assessment: No/denies pain Pain Intervention(s): Monitored during session     Extremity/Trunk Assessment Upper Extremity Assessment RUE Deficits / Details: Grossly 3+/5 RUE Coordination: decreased fine motor;decreased gross motor LUE Deficits / Details: Grossly 3+/5 LUE Coordination: decreased fine  motor;decreased gross motor   Lower Extremity Assessment Lower Extremity Assessment: Defer to PT evaluation   Cervical / Trunk Assessment Cervical / Trunk Assessment: Kyphotic   Communication Communication Communication: No  apparent difficulties   Cognition Arousal: Alert Behavior During Therapy: Flat affect Cognition: History of cognitive impairments                               Following commands: Impaired Following commands impaired: Follows one step commands with increased time     Cueing  General Comments   Cueing Techniques: Verbal cues;Tactile cues   VSS   Exercises     Shoulder Instructions      Home Living Family/patient expects to be discharged to:: Skilled nursing facility                                 Additional Comments: Pt was at [facility] for rehab prior to admission      Prior Functioning/Environment Prior Level of Function : (P) Needs assist;History of Falls (last six months);Patient poor historian/Family not available             Mobility Comments: +2 for short bouts of mobility with rehab staff ADLs Comments: Assist for all ADL's assmue mixture of bedlevel and seated.    OT Problem List: Decreased range of motion;Decreased strength;Decreased activity tolerance;Impaired balance (sitting and/or standing);Impaired vision/perception;Decreased coordination;Decreased cognition;Decreased safety awareness;Impaired tone;Impaired UE functional use;Pain   OT Treatment/Interventions: Self-care/ADL training;Therapeutic exercise;Neuromuscular education;Energy conservation;DME and/or AE instruction;Therapeutic activities;Patient/family education;Visual/perceptual remediation/compensation;Cognitive remediation/compensation;Balance training      OT Goals(Current goals can be found in the care plan section)   Acute Rehab OT Goals Patient Stated Goal: Get stronger OT Goal Formulation: With patient Time For Goal Achievement: 12/14/23 Potential to Achieve Goals: Fair ADL Goals Pt Will Perform Grooming: with set-up;sitting Pt Will Perform Upper Body Dressing: with supervision;sitting Pt Will Perform Lower Body Dressing: with max assist;sit to/from  stand Pt Will Transfer to Toilet: with max assist;stand pivot transfer;bedside commode   OT Frequency:  Min 2X/week    Co-evaluation PT/OT/SLP Co-Evaluation/Treatment: Yes Reason for Co-Treatment: Complexity of the patient's impairments (multi-system involvement);For patient/therapist safety   OT goals addressed during session: ADL's and self-care      AM-PAC OT 6 Clicks Daily Activity     Outcome Measure Help from another person eating meals?: A Little Help from another person taking care of personal grooming?: A Little Help from another person toileting, which includes using toliet, bedpan, or urinal?: A Lot Help from another person bathing (including washing, rinsing, drying)?: A Lot Help from another person to put on and taking off regular upper body clothing?: A Little Help from another person to put on and taking off regular lower body clothing?: A Lot 6 Click Score: 15   End of Session Equipment Utilized During Treatment: Gait belt  Activity Tolerance: Patient tolerated treatment well Patient left: in chair;with call bell/phone within reach;with chair alarm set  OT Visit Diagnosis: Unsteadiness on feet (R26.81);Other abnormalities of gait and mobility (R26.89);Muscle weakness (generalized) (M62.81);History of falling (Z91.81);Other symptoms and signs involving the nervous system (R29.898);Cognitive communication deficit (R41.841)                Time: 1337-1400 OT Time Calculation (min): 23 min Charges:  OT General Charges $OT Visit: 1 Visit OT Evaluation $OT Eval Moderate Complexity: 1 Mod  11/30/2023  RP, OTR/L  Acute Rehabilitation Services  Office:  [phone]   Benjamen Brand 11/30/2023, 2:17 PM

## 2023-11-30 NOTE — Progress Notes (Signed)
 PHARMACY - ANTICOAGULATION  Pharmacy Consult for Heparin  > apixaban  Indication: pulmonary embolus   Allergies  Allergen Reactions   Glucophage [Metformin] Diarrhea   Tnkase  [Tenecteplase ] Other (See Comments)    Unknown reaction    Patient Measurements: Height: 5' 7 (170.2 cm) Weight: 92 kg (202 lb 13.2 oz) IBW/kg (Calculated) : 61.6 HEPARIN  DW (KG): 81.3  Vital Signs: Temp: 98.6 F (37 C) (06/19 0747) Temp Source: Axillary (06/19 0747) BP: 118/42 (06/19 0747) Pulse Rate: 94 (06/19 0747)  Labs: Recent Labs    11/28/23 0532 11/28/23 1656 11/29/23 0548 11/29/23 1410 11/30/23 0034 11/30/23 0608  HGB 7.5*  --  8.4*  --   --  9.1*  HCT 24.2*  --  27.3*  --   --  30.1*  PLT 253  --  240  --   --  311  HEPARINUNFRC  --    < > 0.72* 0.66 0.23*  --   CREATININE 4.59*  --  6.06*  --   --  4.54*   < > = values in this interval not displayed.    Estimated Creatinine Clearance: 13.8 mL/min (A) (by C-G formula based on SCr of 4.54 mg/dL (H)).   Assessment: 68 y.o. female admitted with SOB found to have LLL PE for heparin >>apixaban .  Switching from heparin  to apixaban , now at a loading dose with the confirmation from Dr. Janett Medin from neurology. Will complete loading dose and then transition to maintenance. Also, adding in Zetia . Will educate patient on apixaban  use later today.   Co-pay: $288.83 for 5 mg Eliquis  30 day co-pay due to $590.00 deductible  Goal of Therapy:  Monitor platelets by anticoagulation protocol: Yes   Plan:  D/c heparin  Apixaban  10 mg BID x5 days Apixaban  5 mg BID Pharmacy will monitor peripherally  Ramey Pecos, PharmD-Student

## 2023-11-30 NOTE — Discharge Instructions (Signed)
 .Information on my medicine - ELIQUIS  (apixaban )  This medication education was reviewed with me or my healthcare representative as part of my discharge preparation.  The pharmacist that spoke with me during my hospital stay was:   Why was Eliquis  prescribed for you? Eliquis  was prescribed to treat blood clots that may have been found in the veins of your legs (deep vein thrombosis) or in your lungs (pulmonary embolism) and to reduce the risk of them occurring again.  What do You need to know about Eliquis  ? The starting dose is 10 mg (two 5 mg tablets) taken TWICE daily for the FIRST SEVEN (7) DAYS, then on (enter date)  December 05, 2023  the dose is reduced to ONE 5 mg tablet taken TWICE daily.  Eliquis  may be taken with or without food.   Try to take the dose about the same time in the morning and in the evening. If you have difficulty swallowing the tablet whole please discuss with your pharmacist how to take the medication safely.  Take Eliquis  exactly as prescribed and DO NOT stop taking Eliquis  without talking to the doctor who prescribed the medication.  Stopping may increase your risk of developing a new blood clot.  Refill your prescription before you run out.  After discharge, you should have regular check-up appointments with your healthcare provider that is prescribing your Eliquis .    What do you do if you miss a dose? If a dose of ELIQUIS  is not taken at the scheduled time, take it as soon as possible on the same day and twice-daily administration should be resumed. The dose should not be doubled to make up for a missed dose.  Important Safety Information A possible side effect of Eliquis  is bleeding. You should call your healthcare provider right away if you experience any of the following: Bleeding from an injury or your nose that does not stop. Unusual colored urine (red or dark brown) or unusual colored stools (red or black). Unusual bruising for unknown reasons. A  serious fall or if you hit your head (even if there is no bleeding).  Some medicines may interact with Eliquis  and might increase your risk of bleeding or clotting while on Eliquis . To help avoid this, consult your healthcare provider or pharmacist prior to using any new prescription or non-prescription medications, including herbals, vitamins, non-steroidal anti-inflammatory drugs (NSAIDs) and supplements.  This website has more information on Eliquis  (apixaban ): [email or web address] .[email or web address] [email or web address]     Follow with Primary MD [name], Laine LABOR, MD in 7 days   Get CBC, CMP, 2 view Chest X ray -  checked next visit with your primary MD or SNF MD   Activity: As tolerated with Full fall precautions use walker/cane & assistance as needed  Disposition SNF  Diet: Heart Healthy Low Carb, check CBGs q. [facility]  Special Instructions: If you have smoked or chewed Tobacco  in the last 2 yrs please stop smoking, stop any regular Alcohol   and or any Recreational drug use.  On your next visit with your primary care physician please Get Medicines reviewed and adjusted.  Please request your Prim.MD to go over all Hospital Tests and Procedure/Radiological results at the follow up, please get all Hospital records sent to your Prim MD by signing hospital release before you go home.  If you experience worsening of your admission symptoms, develop shortness of breath, life threatening emergency, suicidal or homicidal thoughts you must seek medical attention immediately by calling 911 or calling your MD  immediately  if symptoms less severe.  You Must read complete instructions/literature along with all the possible adverse reactions/side effects for all the Medicines you take and that have been prescribed to you. Take any new Medicines after you have completely understood and accpet all the possible adverse reactions/side effects.   Do not drive when taking Pain medications.  Do not take more than prescribed  Pain, Sleep and Anxiety Medications  Wear Seat belts while driving.

## 2023-11-30 NOTE — Inpatient Diabetes Management (Signed)
 Inpatient Diabetes Program Recommendations  AACE/ADA: New Consensus Statement on Inpatient Glycemic Control (2015)  Target Ranges:  Prepandial:   less than 140 mg/dL      Peak postprandial:   less than 180 mg/dL (1-2 hours)      Critically ill patients:  140 - 180 mg/dL   Lab Results  Component Value Date   GLUCAP 139 (H) 11/30/2023   HGBA1C 5.7 (H) 11/29/2023    Latest Reference Range & Units 11/29/23 14:19 11/29/23 14:48 11/29/23 16:12 11/29/23 21:36 11/30/23 06:11 11/30/23 07:45  Glucose-Capillary 70 - 99 mg/dL 65 (L) 64 (L) 161 (H) 82 102 (H) 139 (H)  (L): Data is abnormally low (H): Data is abnormally high Review of Glycemic Control  Diabetes history: DM2 Outpatient Diabetes medications: 75/25 insulin  25 units at breakfast, 10 units at lunch, 25 units at dinner Current orders for Inpatient glycemic control: Novolog  0-9 units correction scale TID  Inpatient Diabetes Program Recommendations:   Received diabetes coordinator consult. Noted that patient has only had Novolog  total 2 units since yesterday at 1711. Has had several low blood sugars without any insulin  given. Noted that hgbA1C is 5.7%. Patient remains confused.   Recommend that patient not be on 75/25 insulin  dosages as noted when discharged.   Will continue to follow blood sugars while in the hospital.  Nick Barman RN BSN CDE Diabetes Coordinator Pager: [phone]  8am-5pm

## 2023-11-30 NOTE — Progress Notes (Signed)
 STROKE TEAM PROGRESS NOTE   INTERIM HISTORY/SUBJECTIVE Patient is sitting up in bed.  Looks comfortable.  She is disoriented and slightly confused but follows commands well.  No aphasia apraxia or dysarthria.  Moves all 4 extremities well.  She has mild right lower facial weakness which is chronic.  Carotid ultrasound shows no significant extracranial stenosis.  Lower extremity venous Dopplers are negative for DVT.  She has been transition from IV heparin  to Eliquis  this morning.  No family at the bedside. CBC    Component Value Date/Time   WBC 7.3 11/30/2023 0608   RBC 2.95 (L) 11/30/2023 0608   HGB 9.1 (L) 11/30/2023 0608   HGB 11.6 05/20/2020 1453   HCT 30.1 (L) 11/30/2023 0608   HCT 35.8 05/20/2020 1453   PLT 311 11/30/2023 0608   PLT 276 05/20/2020 1453   MCV 102.0 (H) 11/30/2023 0608   MCV 84 05/20/2020 1453   MCH 30.8 11/30/2023 0608   MCHC 30.2 11/30/2023 0608   RDW 16.1 (H) 11/30/2023 0608   RDW 13.5 05/20/2020 1453   LYMPHSABS 2.5 11/29/2023 0548   LYMPHSABS 1.7 05/20/2020 1453   MONOABS 0.6 11/29/2023 0548   EOSABS 0.2 11/29/2023 0548   EOSABS 0.1 05/20/2020 1453   BASOSABS 0.0 11/29/2023 0548   BASOSABS 0.1 05/20/2020 1453    BMET    Component Value Date/Time   NA 138 11/30/2023 0608   K 4.5 11/30/2023 0608   CL 97 (L) 11/30/2023 0608   CO2 27 11/30/2023 0608   GLUCOSE 131 (H) 11/30/2023 0608   BUN 27 (H) 11/30/2023 0608   CREATININE 4.54 (H) 11/30/2023 0608   CALCIUM  8.5 (L) 11/30/2023 0608   GFRNONAA 10 (L) 11/30/2023 0608    IMAGING past 24 hours VAS US  CAROTID (at [facility] and [facility] only) Result Date: 11/30/2023 Carotid Arterial Duplex Study Patient Name:  Jennifer [name]  Date of Exam:   11/29/2023 Medical Rec #: [id number]      Accession #:    [id number] Date of Birth: [birth year]     Patient Gender: F Patient Age:   68 years Exam Location:  [facility] Procedure:      VAS US  CAROTID Referring Phys: Margart Shears DE LA TORRE  --------------------------------------------------------------------------------  Indications:       CVA. Risk Factors:      Hypertension, hyperlipidemia, Diabetes, past history of                    smoking, prior CVA. Other Factors:     CKD, RUE dialysis access. Comparison Study:  06/12/2023- no hemodynamically significant stenosis by duplex                    criteria in the extracranial cerebrovascular circulation. Performing Technologist: Vonzell Guerin, RVT  Examination Guidelines: A complete evaluation includes B-mode imaging, spectral Doppler, color Doppler, and power Doppler as needed of all accessible portions of each vessel. Bilateral testing is considered an integral part of a complete examination. Limited examinations for reoccurring indications may be performed as noted.  Right Carotid Findings: +----------+--------+--------+--------+------------------+------------------+           PSV cm/sEDV cm/sStenosisPlaque DescriptionComments           +----------+--------+--------+--------+------------------+------------------+ CCA Prox  96      0                                                    +----------+--------+--------+--------+------------------+------------------+  CCA Distal62      9                                                    +----------+--------+--------+--------+------------------+------------------+ ICA Prox  59      11      1-39%                     intimal thickening +----------+--------+--------+--------+------------------+------------------+ ICA Mid   79      15                                                   +----------+--------+--------+--------+------------------+------------------+ ICA Distal71      16                                                   +----------+--------+--------+--------+------------------+------------------+ ECA       103                                                           +----------+--------+--------+--------+------------------+------------------+ +----------+--------+-------+---------------+-------------------+           PSV cm/sEDV cmsDescribe       Arm Pressure (mmHG) +----------+--------+-------+---------------+-------------------+ [id number]            dialysis access                    +----------+--------+-------+---------------+-------------------+ +---------+--------+--+--------+--+---------+ VertebralPSV cm/s54EDV cm/s10Antegrade +---------+--------+--+--------+--+---------+  Left Carotid Findings: +----------+--------+--------+--------+------------------+------------------+           PSV cm/sEDV cm/sStenosisPlaque DescriptionComments           +----------+--------+--------+--------+------------------+------------------+ CCA Prox  111     0                                                    +----------+--------+--------+--------+------------------+------------------+ CCA Distal86      13                                intimal thickening +----------+--------+--------+--------+------------------+------------------+ ICA Prox  76      20                                                   +----------+--------+--------+--------+------------------+------------------+ ICA Distal84      18                                                   +----------+--------+--------+--------+------------------+------------------+  ECA       70                                                           +----------+--------+--------+--------+------------------+------------------+ +----------+--------+--------+--------+-------------------+           PSV cm/sEDV cm/sDescribeArm Pressure (mmHG) +----------+--------+--------+--------+-------------------+ Subclavian216             Stenotic                    +----------+--------+--------+--------+-------------------+ +---------+--------+--+--------+--+---------+ VertebralPSV  cm/s70EDV cm/s14Antegrade +---------+--------+--+--------+--+---------+   Summary: Right Carotid: Velocities in the right ICA are consistent with a 1-39% stenosis. Left Carotid: The extracranial vessels were near-normal with only minimal wall               thickening or plaque. Vertebrals:  Bilateral vertebral arteries demonstrate antegrade flow. Subclavians: Left subclavian artery was stenotic. Monophasic low-resistive flow              in right subclavian artery due to dialysis access. *See table(s) above for measurements and observations.  Electronically signed by Ardella Beaver MD on 11/30/2023 at 10:37:22 AM.    Final    VAS US  LOWER EXTREMITY VENOUS (DVT) Result Date: 11/29/2023  Lower Venous DVT Study Patient Name:  AUBRIANNE MOLYNEUX  Date of Exam:   11/29/2023 Medical Rec #: [id number]      Accession #:    [id number] Date of Birth: 06/26/1955     Patient Gender: F Patient Age:   42 years Exam Location:  [facility] Procedure:      VAS US  LOWER EXTREMITY VENOUS (DVT) Referring Phys: A POWELL JR --------------------------------------------------------------------------------  Indications: Pulmonary embolism.  Comparison Study: 05/21/2023 - negative Performing Technologist: Vonzell Guerin, RVT  Examination Guidelines: A complete evaluation includes B-mode imaging, spectral Doppler, color Doppler, and power Doppler as needed of all accessible portions of each vessel. Bilateral testing is considered an integral part of a complete examination. Limited examinations for reoccurring indications may be performed as noted. The reflux portion of the exam is performed with the patient in reverse Trendelenburg.  +---------+---------------+---------+-----------+----------+--------------+ RIGHT    CompressibilityPhasicitySpontaneityPropertiesThrombus Aging +---------+---------------+---------+-----------+----------+--------------+ CFV      Full           Yes      Yes                                  +---------+---------------+---------+-----------+----------+--------------+ SFJ      Full                                                        +---------+---------------+---------+-----------+----------+--------------+ FV Prox  Full                                                        +---------+---------------+---------+-----------+----------+--------------+ FV Mid   Full                                                        +---------+---------------+---------+-----------+----------+--------------+  FV DistalFull                                                        +---------+---------------+---------+-----------+----------+--------------+ PFV      Full                                                        +---------+---------------+---------+-----------+----------+--------------+ POP      Full           Yes      Yes                                 +---------+---------------+---------+-----------+----------+--------------+ PTV      Full                                                        +---------+---------------+---------+-----------+----------+--------------+ PERO     Full                                                        +---------+---------------+---------+-----------+----------+--------------+   +---------+---------------+---------+-----------+----------+--------------+ LEFT     CompressibilityPhasicitySpontaneityPropertiesThrombus Aging +---------+---------------+---------+-----------+----------+--------------+ CFV      Full           Yes      Yes                                 +---------+---------------+---------+-----------+----------+--------------+ SFJ      Full                                                        +---------+---------------+---------+-----------+----------+--------------+ FV Prox  Full                                                         +---------+---------------+---------+-----------+----------+--------------+ FV Mid   Full                                                        +---------+---------------+---------+-----------+----------+--------------+ FV DistalFull                                                        +---------+---------------+---------+-----------+----------+--------------+  PFV      Full                                                        +---------+---------------+---------+-----------+----------+--------------+ POP      Full           Yes      Yes                                 +---------+---------------+---------+-----------+----------+--------------+ PTV      Full                                                        +---------+---------------+---------+-----------+----------+--------------+ PERO                                                  not well seen  +---------+---------------+---------+-----------+----------+--------------+ The left peroneal veins are not seen well.    Summary: BILATERAL: - No evidence of deep vein thrombosis seen in the lower extremities, bilaterally. -No evidence of popliteal cyst, bilaterally.  LEFT: - There is no evidence of deep vein thrombosis in the lower extremity. However, portions of this examination were limited- see technologist comments above.  *See table(s) above for measurements and observations. Electronically signed by Irvin Mantel on 11/29/2023 at 7:05:40 PM.    Final     Vitals:   11/29/23 2000 11/30/23 0000 11/30/23 0747 11/30/23 0855  BP: (!) 125/52  (!) 118/42 (!) 123/57  Pulse: 93 90 94 95  Resp: 17 15 17 17   Temp: 98 F (36.7 C) 97.8 F (36.6 C) 98.6 F (37 C)   TempSrc: Oral Oral Axillary   SpO2:  98% 95% 95%  Weight:      Height:         PHYSICAL EXAM General: Acutely ill Psych:  Mood and affect appropriate for situation CV: Regular rate and rhythm on monitor Respiratory:  Regular, unlabored  respirations on room air GI: Abdomen soft and nontender   NEURO:  Mental Status: Lethargic arouses easily to stimulation.  She is confused and unable to answer any orientation questions correctly Speech/Language: speech is without aphasia.  Mildly dysarthric.  Naming, repetition, fluency, and comprehension intact.  Cranial Nerves:  II: PERRL. Visual fields full.  III, IV, VI: EOMI. Eyelids elevate symmetrically.  V: Sensation is intact to light touch and symmetrical to face.  VII: Symmetric VIII: hearing intact to voice. IX, X: Palate elevates symmetrically. Phonation is normal.  [id number] shrug 5/5. XII: tongue is midline without fasciculations. Motor: Generalized weakness in all 4 extremities, all 4 extremities are antigravity and spontaneous Tone: is normal and bulk is normal Sensation- Intact to light touch bilaterally. Extinction absent to light touch to DSS.   Coordination: Unable to assess Gait- deferred   ASSESSMENT/PLAN  Jennifer [name] is a 68 y.o. 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  She was also found to be hypoxic, but this has improved.  She was found to have a pulmonary embolus and is now on anticoagulation with heparin .  MRI brain reveals punctate stroke in the left periventricular area.  NIH on Admission 11  Acute Ischemic Infarct:  left periventricular white matter  Etiology: Small vessel disease Code Stroke  CT head No acute abnormality.  Small vessel disease ASPECTS 10.     CTA head & neck no LVO. ntracranial atherosclerotic disease, most notably about the carotid siphons and PCAs where there are associated mild to moderate multifocal stenoses. MRI   punctate acute lacunar infarct involving the left periventricular white matter.  Extensive cerebral white matter disease and chronic encephalomalacia  Numerous foci of hemosiderin staining primarily within the brainstem, periventricular white matter and mesial temporal lobes. MRA no LVO Carotid Doppler bilateral 1-39% stenosis.  2D Echo EF 60 to 65% Lower extremity Dopplers negative for DVT LDL 76 HgbA1c 6.3 VTE prophylaxis -heparin  drip aspirin  81 mg daily prior to admission, now on heparin  IV .  Will need to transition to DOAC for PE when patient able to take p.o. Therapy recommendations:  Pending Disposition: Pending  Hx of Stroke/TIA Vascular dementia Anxiety and depression history of left corpus callosum lacunar infarct in 2020   old right basal ganglia infarct in 2019.   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  On home Namenda   Acute PE Acute hypoxic respiratory failure Managed per primary team CT PE protocol with L lung lingular lobar and lower lobe basal segmetnal branch PE. Sattered patchy and confluent LLL opacity, possible pulmonary infarct and infection/bronchopneumonia  On ceftriaxone  and doxycycline  On heparin  drip LE ultrasound pending   Hypertension CAD Home meds: Carvedilol  12.5 mg, Lasix  80 mg Stable Blood Pressure Goal: SBP less than 160   Hyperlipidemia Home meds: Atorvastatin  80 mg, resumed in hospital LDL 76, goal < 70 Continue statin at discharge  Diabetes type II Controlled Home meds: Insulin  HgbA1c 6.3, goal < 7.0 CBGs SSI Recommend close follow-up with PCP for better DM control  Dysphagia Patient has post-stroke dysphagia, SLP consulted    Diet   Diet Carb Modified Fluid consistency: Thin; Room service appropriate? Yes   Advance diet as tolerated  End-stage renal disease on HD HD Monday Wednesday Friday Nephrology on board  Other Stroke Risk Factors Obesity, Body mass index is 31.77 kg/m., BMI  >/= 30 associated with increased stroke risk, recommend weight loss, diet and exercise as appropriate  Coronary artery disease    Other Active Problems Hypothyroidism Anemia of chronic disease  Hospital day # 2   Patient was presented with an altered mental status initially felt to be related to hypoxia and hypoglycemia but subsequently found to have pulmonary embolus and is now on anticoagulation with heparin .  MRI scan was found to have a tiny punctate left periventricular white matter lacunar infarct likely clinically asymptomatic.  Agree with transitioning IV heparin  to Eliquis  given her acute pulmonary embolism.  Aggressive risk factor modification.  Continue dementia medications and home dosage.  No family available at the bedside.  Discussed with Dr. Zelda Hickman.  Stroke team will sign off.  Can call for questions greater than 50% time during this 35-minute visit was spent in counseling and coordination of care about her silent lacunar stroke discussion with  care team and answering questions  Ardella Beaver, MD Medical Director [facility] Pager: [phone] 11/30/2023 10:57 AM  To contact Stroke Continuity provider, please refer to [email or web address]. After hours, contact General Neurology

## 2023-11-30 NOTE — Progress Notes (Signed)
 PROGRESS NOTE    Jennifer [name]  QIO:[id number] DOB: [birth year] DOA: 11/27/2023 PCP: Clemens Curt, MD  Chief Complaint  Patient presents with   Respiratory Distress    Brief Narrative:   Jennifer [name] is a 68 y.o. female with medical history significant of hypertension, CAD, CVA, diabetes mellitus type 2, ESRD on HD, hypothyroidism, anxiety, depression, and memory loss  altered mental status and hypoglycemia.   Records note patient had just recently been hospitalized 6/9 - 6/12 for acute left-sided weakness without clear signs of CVA on MRI or seizure by EEG.  During hospital stay patient was noted to have E. coli and Proteus in urine cultures and started on Rocephin  6/12 completing course prior to discharge.  During her stay she was also transition to a Monday, Wednesday, Friday schedule for hemodialysis.   She was brought to the hospital from [facility] rehab facility after last night, she was noted to be altered and unresponsive with a low blood sugar level in the fifties. Her oxygen  saturation was also low, in the 60s, and she was given a Duoneb treatment, which only increased her oxygen  saturation to 80-81. She was lethargic and not responding well, leading to a call to emergency services.   Assessment & Plan:    Acute respiratory failure with hypoxia to combination of PE and community-acquired pneumonia. On heparin  drip for PE, being switched to Eliquis  on 11/30/2023, empiric antibiotics for community-acquired pneumonia, no clinical signs of sepsis.  Although likely was septic at the time of admission with hypothermia and hypoglycemia, all sepsis pathophysiology has resolved.  Lower extremity venous duplex unremarkable, echo stable.  Encouraged to sit in chair use I-S and flutter valve for pulmonary toiletry, currently on room air.  Acute Pulmonary Embolism with Possible Pulmonary Infarct As above  Community Acquired Pneumonia As above.  Follow cultures   Acute metabolic  encephalopathy History of Dementia with small punctate lacunar infarct. Baseline dementia, mild intermittent confusion in the hospital, MRI brain with possible punctate acute lacunar infarct - will discuss with neurology, discussed with Dr. Janett Medin on 11/29/2023. Full stroke workup, continue Eliquis , on high-dose Lipitor  which will be continued add Zetia  for better LDL control, A1c stable.    ESRD on HD Patient currently on a Monday, Wednesday, Friday schedule.  - Nephrology consulted on need of hemodialysis.   Hypertension - Continue metoprolol  and furosemide  as tolerated   Hypothyroidism TSH noted to be within normal limits at 1.353 when checked on 11/20/2023 - Continue levothyroxine    Normocytic anemia Chronic and stable monitor.   Right Shin Hematoma Outline, monitor closely with need for heparin  with PE No acute fx/dislocation on tib fib plain films X-ray R Knee, tibia & fibula  unremarkable  History of CVA Hyperlipidemia - Continue aspirin  and atorvastatin    Memory loss - Delirium precautions - Continue Namenda    Anxiety  - Continue current medication regimen  Lives with husband/daughter.  At baseline, before last hospitalization, could transfer, but no extended walking with assistive device.  Wheelchair bound for past year.  Hypoglycemia  Diabetes mellitus type 2 with hypoglycemia due to insulin  Hypoglycemia to 17 on admission Lantus  and sliding scale, dose adjusted monitor closely, may need down titration of her insulin  upon discharge, diabetic and insulin  education.  Lab Results  Component Value Date   HGBA1C 5.7 (H) 11/29/2023   CBG (last 3)  Recent Labs    11/29/23 2136 11/30/23 0611 11/30/23 0745  GLUCAP 82 102* 139*     DVT prophylaxis:  heparin  gtt Code Status: full Family Communication: Discussed with daughter Leslye Rast [phone]  11/29/2023, 11/30/2023 Disposition:   Status is: Observation The patient remains OBS appropriate and will d/c before  2 midnights.   Consultants:  Neurology Nephrology  Procedures:  none  Subjective:   Patient in bed, appears comfortable, denies any headache, no fever, no chest pain or pressure, no shortness of breath , no abdominal pain. No new focal weakness.   Objective: Vitals:   11/29/23 1604 11/29/23 2000 11/30/23 0000 11/30/23 0747  BP: 112/64 (!) 125/52  (!) 118/42  Pulse: 88 93 90 94  Resp:  17 15 17   Temp: 97.7 F (36.5 C) 98 F (36.7 C) 97.8 F (36.6 C) 98.6 F (37 C)  TempSrc: Oral Oral Oral Axillary  SpO2: 95%  98% 95%  Weight:      Height:        Intake/Output Summary (Last 24 hours) at 11/30/2023 0822 Last data filed at 11/30/2023 0748 Gross per 24 hour  Intake 659.02 ml  Output 2300 ml  Net -1640.98 ml   Filed Weights   11/27/23 0344 11/29/23 0915 11/29/23 1245  Weight: 91.2 kg 94.2 kg 92 kg    Examination:  Awake mildly confused, No new F.N deficits, chronic R. Facial droop Marvin.AT,PERRAL Supple Neck, No JVD,   Symmetrical Chest wall movement, Good air movement bilaterally, CTAB RRR,No Gallops, Rubs or new Murmurs,  +ve B.Sounds, Abd Soft, No tenderness,   No Cyanosis, Clubbing or edema     Data Review:   Inpatient Medications  Scheduled Meds:  atorvastatin   80 mg Oral QHS   buPROPion   150 mg Oral BID   carvedilol   12.5 mg Oral Q M,W,F   Chlorhexidine  Gluconate Cloth  6 each Topical Q0600   doxycycline   100 mg Oral Q12H   furosemide   80 mg Oral Q M,W,F   insulin  aspart  0-9 Units Subcutaneous TID WC   levothyroxine   175 mcg Oral QAC breakfast   memantine   10 mg Oral BID   sodium chloride  flush  3 mL Intravenous Q12H   Continuous Infusions:  cefTRIAXone  (ROCEPHIN )  IV 2 g (11/30/23 0423)   heparin  950 Units/hr (11/30/23 0145)   PRN Meds:.acetaminophen  **OR** acetaminophen , albuterol   DVT Prophylaxis    Recent Labs  Lab 11/25/23 0300 11/25/23 0724 11/27/23 0343 11/27/23 0358 11/27/23 0359 11/28/23 0532 11/29/23 0548 11/30/23 0608  WBC  7.0  --  11.4*  --   --  11.4* 7.4 7.3  HGB 8.0*   < > 9.6* 9.9* 8.5* 7.5* 8.4* 9.1*  HCT 26.5*   < > 30.8* 29.0* 25.0* 24.2* 27.3* 30.1*  PLT 219  --  266  --   --  253 240 311  MCV 99.3  --  99.7  --   --  100.8* 100.0 102.0*  MCH 30.0  --  31.1  --   --  31.3 30.8 30.8  MCHC 30.2  --  31.2  --   --  31.0 30.8 30.2  RDW 15.7*  --  15.9*  --   --  15.9* 16.2* 16.1*  LYMPHSABS  --   --  0.7  --   --   --  2.5  --   MONOABS  --   --  0.6  --   --   --  0.6  --   EOSABS  --   --  0.0  --   --   --  0.2  --   BASOSABS  --   --  0.1  --   --   --  0.0  --    < > = values in this interval not displayed.    Recent Labs  Lab 11/24/23 0407 11/25/23 0300 11/26/23 0252 11/27/23 0343 11/27/23 0358 11/27/23 0359 11/27/23 0502 11/27/23 0739 11/28/23 0532 11/29/23 0548 11/29/23 1410 11/30/23 0608  NA 138 136 134* 137 137 135  --   --  136 136  --  138  K 4.0 4.4 3.7 4.6 4.4 4.4  --   --  4.1 4.2  --  4.5  CL 99 100 97* 98 96*  --   --   --  95* 95*  --  97*  CO2 28 28 25 28   --   --   --   --  27 27  --  27  ANIONGAP 11 8 12 11   --   --   --   --  14 14  --  14  GLUCOSE 94 131* 124* 109* 108*  --   --   --  153* 100*  --  131*  BUN 36* 59* 33* 45* 45*  --   --   --  30* 38*  --  27*  CREATININE 3.78* 5.20* 4.02* 5.64* 6.00*  --   --   --  4.59* 6.06*  --  4.54*  AST  --   --   --  21  --   --   --   --   --  16  --   --   ALT  --   --   --  13  --   --   --   --   --  14  --   --   ALKPHOS  --   --   --  54  --   --   --   --   --  43  --   --   BILITOT  --   --   --  0.6  --   --   --   --   --  0.6  --   --   ALBUMIN 2.2* 2.1* 2.3* 2.2*  --   --   --   --  2.3* 2.1*  --  2.3*  DDIMER  --   --   --   --   --   --   --  1.14*  --   --   --   --   PROCALCITON  --   --   --  0.49  --   --   --   --   --   --   --   --   LATICACIDVEN  --   --   --   --   --  0.7  --   --   --   --   --   --   INR  --   --   --  1.1  --   --   --   --   --   --   --   --   HGBA1C  --   --   --   --   --    --   --   --   --   --  5.7*  --   AMMONIA  --   --   --   --   --   --  22  --   --   --   --   --  BNP  --   --   --  36.6  --   --   --   --  50.2  --   --   --   MG 2.1 2.3  --   --   --   --   --   --   --  2.2  --   --   PHOS 3.8 4.8* 3.4  --   --   --   --   --  3.6 4.8*  --  4.0  CALCIUM  7.8* 7.9* 7.8* 8.6*  --   --   --   --  8.5* 8.4*  --  8.5*      Recent Labs  Lab 11/24/23 0407 11/25/23 0300 11/26/23 0252 11/27/23 0343 11/27/23 0359 11/27/23 0502 11/27/23 0739 11/28/23 0532 11/29/23 0548 11/29/23 1410 11/30/23 4098  DDIMER  --   --   --   --   --   --  1.14*  --   --   --   --   PROCALCITON  --   --   --  0.49  --   --   --   --   --   --   --   LATICACIDVEN  --   --   --   --  0.7  --   --   --   --   --   --   INR  --   --   --  1.1  --   --   --   --   --   --   --   HGBA1C  --   --   --   --   --   --   --   --   --  5.7*  --   AMMONIA  --   --   --   --   --  22  --   --   --   --   --   BNP  --   --   --  36.6  --   --   --  50.2  --   --   --   MG 2.1 2.3  --   --   --   --   --   --  2.2  --   --   CALCIUM  7.8* 7.9* 7.8* 8.6*  --   --   --  8.5* 8.4*  --  8.5*    --------------------------------------------------------------------------------------------------------------- Lab Results  Component Value Date   CHOL 157 11/29/2023   HDL 67 11/29/2023   LDLCALC 76 11/29/2023   LDLDIRECT 154.2 11/04/2011   TRIG 68 11/29/2023   CHOLHDL 2.3 11/29/2023    Lab Results  Component Value Date   HGBA1C 5.7 (H) 11/29/2023    Radiology Reports  VAS US  LOWER EXTREMITY VENOUS (DVT) Result Date: 11/29/2023  Lower Venous DVT Study Patient Name:  Jennifer [name]  Date of Exam:   11/29/2023 Medical Rec #: [id number]      Accession #:    [id number] Date of Birth: 1955-10-04     Patient Gender: F Patient Age:   39 years Exam Location:  [facility] Procedure:      VAS US  LOWER EXTREMITY VENOUS (DVT) Referring Phys: A POWELL JR  --------------------------------------------------------------------------------  Indications: Pulmonary embolism.  Comparison Study: 05/21/2023 - negative Performing Technologist: Vonzell Guerin, RVT  Examination Guidelines: A complete evaluation includes B-mode imaging, spectral Doppler, color Doppler, and power  Doppler as needed of all accessible portions of each vessel. Bilateral testing is considered an integral part of a complete examination. Limited examinations for reoccurring indications may be performed as noted. The reflux portion of the exam is performed with the patient in reverse Trendelenburg.  +---------+---------------+---------+-----------+----------+--------------+ RIGHT    CompressibilityPhasicitySpontaneityPropertiesThrombus Aging +---------+---------------+---------+-----------+----------+--------------+ CFV      Full           Yes      Yes                                 +---------+---------------+---------+-----------+----------+--------------+ SFJ      Full                                                        +---------+---------------+---------+-----------+----------+--------------+ FV Prox  Full                                                        +---------+---------------+---------+-----------+----------+--------------+ FV Mid   Full                                                        +---------+---------------+---------+-----------+----------+--------------+ FV DistalFull                                                        +---------+---------------+---------+-----------+----------+--------------+ PFV      Full                                                        +---------+---------------+---------+-----------+----------+--------------+ POP      Full           Yes      Yes                                 +---------+---------------+---------+-----------+----------+--------------+ PTV      Full                                                         +---------+---------------+---------+-----------+----------+--------------+ PERO     Full                                                        +---------+---------------+---------+-----------+----------+--------------+   +---------+---------------+---------+-----------+----------+--------------+  LEFT     CompressibilityPhasicitySpontaneityPropertiesThrombus Aging +---------+---------------+---------+-----------+----------+--------------+ CFV      Full           Yes      Yes                                 +---------+---------------+---------+-----------+----------+--------------+ SFJ      Full                                                        +---------+---------------+---------+-----------+----------+--------------+ FV Prox  Full                                                        +---------+---------------+---------+-----------+----------+--------------+ FV Mid   Full                                                        +---------+---------------+---------+-----------+----------+--------------+ FV DistalFull                                                        +---------+---------------+---------+-----------+----------+--------------+ PFV      Full                                                        +---------+---------------+---------+-----------+----------+--------------+ POP      Full           Yes      Yes                                 +---------+---------------+---------+-----------+----------+--------------+ PTV      Full                                                        +---------+---------------+---------+-----------+----------+--------------+ PERO                                                  not well seen  +---------+---------------+---------+-----------+----------+--------------+ The left peroneal veins are not seen well.    Summary: BILATERAL: - No evidence of deep  vein thrombosis seen in the lower extremities, bilaterally. -No evidence of popliteal cyst, bilaterally.  LEFT: - There is no evidence of deep vein thrombosis in the lower extremity. However, portions of this examination were limited-  see technologist comments above.  *See table(s) above for measurements and observations. Electronically signed by Irvin Mantel on 11/29/2023 at 7:05:40 PM.    Final    VAS US  CAROTID (at [facility] and [facility] only) Result Date: 11/29/2023 Carotid Arterial Duplex Study Patient Name:  Jennifer [name]  Date of Exam:   11/29/2023 Medical Rec #: [id number]      Accession #:    [id number] Date of Birth: 08-17-1955     Patient Gender: F Patient Age:   66 years Exam Location:  [facility] Procedure:      VAS US  CAROTID Referring Phys: Margart Shears DE LA TORRE --------------------------------------------------------------------------------  Indications:       CVA. Risk Factors:      Hypertension, hyperlipidemia, Diabetes, past history of                    smoking, prior CVA. Other Factors:     CKD, RUE dialysis access. Comparison Study:  06/12/2023- no hemodynamically significant stenosis by duplex                    criteria in the extracranial cerebrovascular circulation. Performing Technologist: Vonzell Guerin, RVT  Examination Guidelines: A complete evaluation includes B-mode imaging, spectral Doppler, color Doppler, and power Doppler as needed of all accessible portions of each vessel. Bilateral testing is considered an integral part of a complete examination. Limited examinations for reoccurring indications may be performed as noted.  Right Carotid Findings: +----------+--------+--------+--------+------------------+------------------+           PSV cm/sEDV cm/sStenosisPlaque DescriptionComments           +----------+--------+--------+--------+------------------+------------------+ CCA Prox  96      0                                                     +----------+--------+--------+--------+------------------+------------------+ CCA Distal62      9                                                    +----------+--------+--------+--------+------------------+------------------+ ICA Prox  59      11      1-39%                     intimal thickening +----------+--------+--------+--------+------------------+------------------+ ICA Mid   79      15                                                   +----------+--------+--------+--------+------------------+------------------+ ICA Distal71      16                                                   +----------+--------+--------+--------+------------------+------------------+ ECA       103                                                          +----------+--------+--------+--------+------------------+------------------+ +----------+--------+-------+---------------+-------------------+  PSV cm/sEDV cmsDescribe       Arm Pressure (mmHG) +----------+--------+-------+---------------+-------------------+ [id number]            dialysis access                    +----------+--------+-------+---------------+-------------------+ +---------+--------+--+--------+--+---------+ VertebralPSV cm/s54EDV cm/s10Antegrade +---------+--------+--+--------+--+---------+  Left Carotid Findings: +----------+--------+--------+--------+------------------+------------------+           PSV cm/sEDV cm/sStenosisPlaque DescriptionComments           +----------+--------+--------+--------+------------------+------------------+ CCA Prox  111     0                                                    +----------+--------+--------+--------+------------------+------------------+ CCA Distal86      13                                intimal thickening +----------+--------+--------+--------+------------------+------------------+ ICA Prox  76      20                                                    +----------+--------+--------+--------+------------------+------------------+ ICA Distal84      18                                                   +----------+--------+--------+--------+------------------+------------------+ ECA       70                                                           +----------+--------+--------+--------+------------------+------------------+ +----------+--------+--------+--------+-------------------+           PSV cm/sEDV cm/sDescribeArm Pressure (mmHG) +----------+--------+--------+--------+-------------------+ Subclavian216             Stenotic                    +----------+--------+--------+--------+-------------------+ +---------+--------+--+--------+--+---------+ VertebralPSV cm/s70EDV cm/s14Antegrade +---------+--------+--+--------+--+---------+   Summary: Right Carotid: Velocities in the right ICA are consistent with a 1-39% stenosis. Left Carotid: The extracranial vessels were near-normal with only minimal wall               thickening or plaque. Vertebrals:  Bilateral vertebral arteries demonstrate antegrade flow. Subclavians: Left subclavian artery was stenotic. Monophasic low-resistive flow              in right subclavian artery due to dialysis access. *See table(s) above for measurements and observations.     Preliminary    DG Chest Port 1 View Result Date: 11/29/2023 CLINICAL DATA:  68 year old female with shortness of breath. Left lower lobe pulmonary emboli detected yesterday. EXAM: PORTABLE CHEST 1 VIEW COMPARISON:  CTA chest yesterday. FINDINGS: Portable AP semi upright view at 0633 hours. Lung volumes are stable and within normal limits. Stable cardiac size and mediastinal contours. Calcified aortic atherosclerosis. Streaky and confluent left lower lobe opacity better demonstrated by CTA yesterday. Lung ventilation  appears stable from 11/27/2023 radiographs. Visualized tracheal air column is within normal  limits. No pneumothorax, edema. No definite pleural effusion. Paucity of bowel gas. Stable cholecystectomy clips. Stable visualized osseous structures. IMPRESSION: Stable ventilation. Left lower lobe pulmonary emboli and airspace opacity better demonstrated by CTA yesterday. No new cardiopulmonary abnormality. Electronically Signed   By: Marlise Simpers M.D.   On: 11/29/2023 06:59   MR ANGIO HEAD WO CONTRAST Result Date: 11/28/2023 CLINICAL DATA:  Follow-up examination for stroke. EXAM: MRA HEAD WITHOUT CONTRAST TECHNIQUE: Angiographic images of the Circle of Willis were acquired using MRA technique without intravenous contrast. COMPARISON:  Prior brain MRI from 11/27/2023 FINDINGS: Anterior circulation: Both internal carotid arteries are patent through the siphons without hemodynamically significant stenosis. No made of a tiny 1-2 mm outpouching arising from the supraclinoid left ICA (series 5, image 93). A small vessel is seen coursing from the apex of this outpouching, consistent with a small vascular infundibulum. A1 segments patent bilaterally. Normal anterior communicating artery complex. Anterior cerebral arteries patent without significant stenosis. No M1 stenosis or occlusion. Distal MCA branches perfused and symmetric. Posterior circulation: Vertebral arteries are largely codominant and patent without stenosis. Both PICA patent. Basilar patent without stenosis. Superior cerebral arteries patent bilaterally. Both PCAs primarily supplied via the basilar. Moderate bilateral P2 stenoses noted (series 1037, image 14). PCAs otherwise patent to their distal aspects. Anatomic variants: None significant. Other: No intracranial aneurysm. IMPRESSION: 1. Negative intracranial MRA for large vessel occlusion. 2. Moderate bilateral P2 stenoses. 3. Otherwise wide patency of the major intracranial arterial circulation. No other hemodynamically significant or correctable stenosis. Electronically Signed   By: Virgia Griffins  M.D.   On: 11/28/2023 16:47   ECHOCARDIOGRAM LIMITED Result Date: 11/28/2023    ECHOCARDIOGRAM LIMITED REPORT   Patient Name:   Jennifer [name] Date of Exam: 11/28/2023 Medical Rec #:  [id number]     Height:       67.0 in Accession #:    [id number]    Weight:       201.1 lb Date of Birth:  September 19, 1955    BSA:          2.027 m Patient Age:    67 years      BP:           134/49 mmHg Patient Gender: F             HR:           65 bpm. Exam Location:  Inpatient Procedure: Limited Echo (Both Spectral and Color Flow Doppler were utilized            during procedure). Indications:    Pulmonary Embolus I26.09  History:        Patient has prior history of Echocardiogram examinations, most                 recent 11/21/2023.  Sonographer:    Hersey Lorenzo RDCS Referring Phys: [phone] A CALDWELL POWELL JR IMPRESSIONS  1. Left ventricular ejection fraction, by estimation, is 60 to 65%. The left ventricle has normal function. The left ventricle has no regional wall motion abnormalities.  2. Right ventricular systolic function is normal. The right ventricular size is normal.  3. The mitral valve is normal in structure. No evidence of mitral valve regurgitation. No evidence of mitral stenosis.  4. The aortic valve is normal in structure. There is mild calcification of the aortic valve. Aortic valve regurgitation is not visualized. No aortic stenosis is present.  5. The inferior vena cava is normal in size with greater than 50% respiratory variability, suggesting right atrial pressure of 3 mmHg. FINDINGS  Left Ventricle: Left ventricular ejection fraction, by estimation, is 60 to 65%. The left ventricle has normal function. The left ventricle has no regional wall motion abnormalities. The left ventricular internal cavity size was normal in size. There is  no left ventricular hypertrophy. Right Ventricle: The right ventricular size is normal. No increase in right ventricular wall thickness. Right ventricular systolic function is normal.  Left Atrium: Left atrial size was normal in size. Right Atrium: Right atrial size was normal in size. Pericardium: There is no evidence of pericardial effusion. Mitral Valve: The mitral valve is normal in structure. No evidence of mitral valve stenosis. Tricuspid Valve: The tricuspid valve is normal in structure. Tricuspid valve regurgitation is not demonstrated. No evidence of tricuspid stenosis. Aortic Valve: The aortic valve is normal in structure. There is mild calcification of the aortic valve. Aortic valve regurgitation is not visualized. No aortic stenosis is present. Pulmonic Valve: The pulmonic valve was not assessed. Pulmonic valve regurgitation is not visualized. No evidence of pulmonic stenosis. Aorta: The aortic root is normal in size and structure. Venous: The inferior vena cava is normal in size with greater than 50% respiratory variability, suggesting right atrial pressure of 3 mmHg. IAS/Shunts: No atrial level shunt detected by color flow Doppler. LEFT VENTRICLE PLAX 2D LVIDd:         5.00 cm LVIDs:         4.00 cm LV PW:         0.90 cm LV IVS:        1.00 cm  Aditya Sabharwal Electronically signed by Alwin Baars Signature Date/Time: 11/28/2023/3:21:14 PM    Final       Signature  -   Lynnwood Sauer M.D on 11/30/2023 at 8:22 AM   -  To page go to [email or web address]

## 2023-11-30 NOTE — Plan of Care (Signed)
  Problem: Education: Goal: Ability to describe self-care measures that may prevent or decrease complications (Diabetes Survival Skills Education) will improve Outcome: Progressing Goal: Individualized Educational Video(s) Outcome: Progressing   Problem: Skin Integrity: Goal: Risk for impaired skin integrity will decrease Outcome: Progressing   Problem: Activity: Goal: Risk for activity intolerance will decrease Outcome: Progressing   Problem: Safety: Goal: Ability to remain free from injury will improve Outcome: Progressing

## 2023-11-30 NOTE — Progress Notes (Signed)
 PHARMACY - ANTICOAGULATION  Pharmacy Consult for Heparin  Indication: pulmonary embolus   Allergies  Allergen Reactions   Glucophage [Metformin] Diarrhea   Tnkase  [Tenecteplase ] Other (See Comments)    Unknown reaction    Patient Measurements: Height: 5' 7 (170.2 cm) Weight: 92 kg (202 lb 13.2 oz) IBW/kg (Calculated) : 61.6 HEPARIN  DW (KG): 81.3  Vital Signs: Temp: 97.8 F (36.6 C) (06/19 0000) Temp Source: Oral (06/19 0000) BP: 125/52 (06/18 2000) Pulse Rate: 90 (06/19 0000)  Labs: Recent Labs    11/27/23 0343 11/27/23 0358 11/27/23 0359 11/28/23 0532 11/28/23 1656 11/29/23 0548 11/29/23 1410 11/30/23 0034  HGB 9.6* 9.9* 8.5* 7.5*  --  8.4*  --   --   HCT 30.8* 29.0* 25.0* 24.2*  --  27.3*  --   --   PLT 266  --   --  253  --  240  --   --   APTT 34  --   --   --   --   --   --   --   LABPROT 13.9  --   --   --   --   --   --   --   INR 1.1  --   --   --   --   --   --   --   HEPARINUNFRC  --   --   --   --    < > 0.72* 0.66 0.23*  CREATININE 5.64* 6.00*  --  4.59*  --  6.06*  --   --    < > = values in this interval not displayed.    Estimated Creatinine Clearance: 10.4 mL/min (A) (by C-G formula based on SCr of 6.06 mg/dL (H)).   Assessment: 68 y.o. female admitted with SOB found to have LLL PE for heparin .  Heparin  now below goal on 850 units/hr. Per RN, no issues with the heparin  running continuously or signs/symptoms of bleeding.  Goal of Therapy:  Heparin  level 0.3-0.5 units/mL Monitor platelets by anticoagulation protocol: Yes   Plan:  Increase Heparin  950 units/hr Check heparin  level in 8 hours  Young Hensen, PharmD, BCPS Clinical Pharmacist 11/30/2023 1:40 AM

## 2023-11-30 NOTE — TOC CAGE-AID Note (Signed)
 Transition of Care [facility]) - CAGE-AID Screening   Patient Details  Name: Jennifer [name] MRN: [id number] Date of Birth: December 17, 1955  Transition of Care [facility]) CM/SW Contact:    Jatoria Kneeland E Okechukwu Regnier, LCSW Phone Number: 11/30/2023, 9:44 AM   Clinical Narrative: Patient with confusion. No SA noted.   CAGE-AID Screening: Substance Abuse Screening unable to be completed due to: : Patient unable to participate

## 2023-11-30 NOTE — Evaluation (Signed)
 Speech Language Pathology Evaluation Patient Details Name: Jennifer [name] MRN: [id number] DOB: [birth year] Today's Date: 11/30/2023 Time: 0865-7846 SLP Time Calculation (min) (ACUTE ONLY): 19 min  Problem List:  Patient Active Problem List   Diagnosis Date Noted   Pulmonary embolism (HCC) 11/28/2023   Acute respiratory failure with hypoxia (HCC) 11/27/2023   Acute metabolic encephalopathy 11/27/2023   Controlled type 2 diabetes mellitus with hypoglycemia (HCC) 11/27/2023   SIRS (systemic inflammatory response syndrome) (HCC) 11/27/2023   Acute left-sided weakness 11/20/2023   Abnormal urine odor 05/30/2023   Recurrent strokes (HCC) 05/20/2023   Anemia of chronic renal failure 12/19/2022   (HFpEF) heart failure with preserved ejection fraction (HCC) 11/25/2022   Auditory hallucination 07/26/2022   Carotid artery stenosis, asymptomatic, left 05/04/2022   Anemia of chronic disease 03/23/2022   Hyponatremia 03/23/2022   Hyperkalemia 03/23/2022   Dehydration 03/23/2022   Chronic diastolic CHF (congestive heart failure) (HCC) 03/23/2022   Closed left ankle fracture 01/26/2022   Type 2 diabetes mellitus with hyperglycemia (HCC) 01/07/2022   Acute kidney injury superimposed on chronic kidney disease (HCC) 11/19/2021   Frequent urination 07/13/2021   Change in mental status 07/13/2021   Gait abnormality 07/13/2021   Memory loss 07/13/2021   Abnormal urinalysis 07/13/2021   ESRD on hemodialysis (HCC) 07/13/2021   Normocytic anemia 07/13/2021   Diabetic nephropathy (HCC) 05/28/2020   Diabetic retinopathy (HCC) 05/28/2020   Presence of insulin  pump (external) (internal) 05/28/2020   Diabetic macular edema of right eye with proliferative retinopathy associated with type 2 diabetes mellitus (HCC) 04/16/2020   Macular pucker, right eye 04/16/2020   Vitreous hemorrhage of left eye (HCC) 04/16/2020   Pre-op examination 03/13/2020   Chronic venous insufficiency 02/06/2020   Constipation  05/28/2019   S/P TKR (total knee replacement), right 03/23/2019 05/07/2019   Primary osteoarthritis of right knee 04/02/2019   Unilateral primary osteoarthritis, right knee    Pedal edema 02/25/2019   Acute vertigo with vomiting and inability to stand 10/08/2018   Uncontrolled type 2 diabetes mellitus with hyperglycemia, with long-term current use of insulin  (HCC) 10/08/2018   Former smoker 10/08/2018   Carotid artery disease (HCC) 03/15/2018   History of CVA (cerebrovascular accident) 11/16/2017   Low back pain 10/26/2017   Screening mammogram, encounter for 09/18/2017   Coronary artery disease involving native coronary artery of native heart without angina pectoris 11/04/2015   NSTEMI (non-ST elevated myocardial infarction) (HCC) 11/03/2015   Leukocytosis 08/16/2010   Acquired hypothyroidism 07/06/2010   HLD (hyperlipidemia) 08/28/2008   Essential hypertension 11/13/2007   Proliferative diabetic retinopathy of left eye with macular edema associated with type 2 diabetes mellitus (HCC) 09/21/2006   Diabetic peripheral neuropathy (HCC) 09/21/2006   Obesity (BMI 30-39.9) 09/21/2006   Anxiety 09/21/2006   Adjustment disorder with mixed anxiety and depressed mood 09/21/2006   CARPAL TUNNEL SYNDROME 09/21/2006   COPD (chronic obstructive pulmonary disease) (HCC) 09/21/2006   EDEMA 09/21/2006   MIGRAINES, HX OF 09/21/2006   Past Medical History:  Past Medical History:  Diagnosis Date   CAD (coronary artery disease)    cath 5/23 100% dist RCA lesion treated with 2 overlapping Integrity Resolute DES ( 2.25x59mm, 2.25x36mm), 60% mid RCA treated medically, 99% OM2 not amenable to PCI, 70% D1 lesion, EF normal   Hypercholesteremia    Hypertension    Hypothyroidism    Kidney disease    Renal disorder    Stroke (HCC) 10/2018   Thyroid  disease    Type 2 diabetes mellitus (HCC)  10/08/2018   Past Surgical History:  Past Surgical History:  Procedure Laterality Date   ABDOMINAL  HYSTERECTOMY     AV FISTULA PLACEMENT Right 06/28/2022   Procedure: RIGHT ARM ARTERIOVENOUS (AV) FISTULA CREATION;  Surgeon: Mayo Speck, MD;  Location: [facility];  Service: Vascular;  Laterality: Right;   CARDIAC CATHETERIZATION N/A 11/03/2015   Procedure: Left Heart Cath and Coronary Angiography;  Surgeon: Arleen Lacer, MD;  Location: [facility] INVASIVE CV LAB;  Service: Cardiovascular;  Laterality: N/A;   CARDIAC CATHETERIZATION N/A 11/03/2015   Procedure: Coronary Stent Intervention;  Surgeon: Arleen Lacer, MD;  Location: [facility] INVASIVE CV LAB;  Service: Cardiovascular;  Laterality: N/A;   CARPAL TUNNEL RELEASE     CORONARY ANGIOPLASTY     1 stent   FISTULA SUPERFICIALIZATION Right 08/16/2022   Procedure: RIGHT ARTERIOVENOUS FISTULA SUPERFICIALIZATION;  Surgeon: Mayo Speck, MD;  Location: [facility];  Service: Vascular;  Laterality: Right;   TOTAL KNEE ARTHROPLASTY Right 04/02/2019   Procedure: TOTAL KNEE ARTHROPLASTY;  Surgeon: Darrin Emerald, MD;  Location: [facility];  Service: Orthopedics;  Laterality: Right;   HPI:  Jennifer [name] is a 68 y.o. female who was brought to the hospital from [facility] rehab facility where she was noted to be altered and unresponsive with a low blood sugar level in the fifties. MRI 6/16: 1. Questionable punctate acute lacunar infarct involving the left periventricular white matter. 2. Extensive cerebral white matter disease and chronic encephalomalacia changes within the left cerebellar hemisphere. 3. Numerous foci of hemosiderin staining primarily within the brainstem, periventricular white matter and mesial temporal lobes. Pt with medical history significant of hypertension, CAD, CVA, diabetes mellitus type 2, ESRD on HD, hypothyroidism, anxiety, depression, and memory loss, altered mental status, and hypoglycemia. Records note patient had just recently been hospitalized 6/9 - 6/12 for acute left-sided weakness without clear signs of CVA on MRI or seizure by EEG.    Assessment / Plan / Recommendation Clinical Impression  Pt presents with moderate-severe cognitive impairments.  She was assessed using the SLUMS and scored 10/30.  Pt with hx of dementia.  Pt and husband deny changes this admission.  Today she benefited from category cues to recall items from word list.  She benefited from mild cuing for narrative recall task for one of 4 items.  She correctly answered an addtional 2 independently.  Pt completed calculations for money management without cuing within one integer of correct answer for 2 step problem. She had difficutly with figure identification and comparison.  On clock drawing all numbers were placed in order, but not at correct position on clock face.  She was unable to place hands correctly.    Suspect pt is at or near baseline level of function, given hx and family report that pt exhbits no new deficits.  SLP will sign off at this time.  Please reconsult if there is a change in functional status.    SLP Assessment  SLP Recommendation/Assessment: Patient does not need any further Speech Language Pathology Services SLP Visit Diagnosis: Cognitive communication deficit (R41.841)     Assistance Recommended at Discharge  Intermittent Supervision/Assistance  Functional Status Assessment Patient has not had a recent decline in their functional status  Frequency and Duration  (N/A)         SLP Evaluation Cognition  Overall Cognitive Status: History of cognitive impairments - at baseline Orientation Level: Oriented to person (partially oriented to time) Year:  (I can't figure that one out) Day  of Week: Correct Attention: Focused Focused Attention: Impaired Focused Attention Impairment: Verbal basic Memory: Impaired Memory Impairment: Decreased short term memory Decreased Short Term Memory: Verbal basic Problem Solving: Impaired Executive Function: Organizing Organizing: Impaired Organizing Impairment: Functional basic        Comprehension  Auditory Comprehension Overall Auditory Comprehension: Appears within functional limits for tasks assessed Visual Recognition/Discrimination Discrimination: Not tested Reading Comprehension Reading Status: Not tested    Expression Expression Primary Mode of Expression: Verbal Verbal Expression Overall Verbal Expression: Appears within functional limits for tasks assessed Level of Generative/Spontaneous Verbalization: Sentence Naming: Impairment Divergent: 25-49% accurate   Oral / Motor  Oral Motor/Sensory Function Overall Oral Motor/Sensory Function: Mild impairment Facial ROM: Reduced right Facial Symmetry: Abnormal symmetry right            Elester Grim, MA, CCC-SLP Acute Rehabilitation Services Office: [phone] 11/30/2023, 11:31 AM

## 2023-12-01 DIAGNOSIS — R401 Stupor: Secondary | ICD-10-CM | POA: Diagnosis not present

## 2023-12-01 LAB — RENAL FUNCTION PANEL
Albumin: 2.3 g/dL — ABNORMAL LOW (ref 3.5–5.0)
Anion gap: 13 (ref 5–15)
BUN: 46 mg/dL — ABNORMAL HIGH (ref 8–23)
CO2: 26 mmol/L (ref 22–32)
Calcium: 8.6 mg/dL — ABNORMAL LOW (ref 8.9–10.3)
Chloride: 96 mmol/L — ABNORMAL LOW (ref 98–111)
Creatinine, Ser: 5.89 mg/dL — ABNORMAL HIGH (ref 0.44–1.00)
GFR, Estimated: 7 mL/min — ABNORMAL LOW (ref >60)
Glucose, Bld: 154 mg/dL — ABNORMAL HIGH (ref 70–99)
Phosphorus: 5 mg/dL — ABNORMAL HIGH (ref 2.5–4.6)
Potassium: 4.2 mmol/L (ref 3.5–5.1)
Sodium: 135 mmol/L (ref 135–145)

## 2023-12-01 LAB — CBC
HCT: 27 % — ABNORMAL LOW (ref 36.0–46.0)
Hemoglobin: 8.2 g/dL — ABNORMAL LOW (ref 12.0–15.0)
MCH: 30.1 pg (ref 26.0–34.0)
MCHC: 30.4 g/dL (ref 30.0–36.0)
MCV: 99.3 fL (ref 80.0–100.0)
Platelets: 284 10*3/uL (ref 150–400)
RBC: 2.72 MIL/uL — ABNORMAL LOW (ref 3.87–5.11)
RDW: 15.9 % — ABNORMAL HIGH (ref 11.5–15.5)
WBC: 6.6 10*3/uL (ref 4.0–10.5)
nRBC: 0 % (ref 0.0–0.2)

## 2023-12-01 LAB — GLUCOSE, CAPILLARY
Glucose-Capillary: 102 mg/dL — ABNORMAL HIGH (ref 70–99)
Glucose-Capillary: 110 mg/dL — ABNORMAL HIGH (ref 70–99)
Glucose-Capillary: 249 mg/dL — ABNORMAL HIGH (ref 70–99)

## 2023-12-01 NOTE — Procedures (Signed)
 Patient seen and examined on Hemodialysis. The procedure was supervised and I have made appropriate changes. BP 131/61   Pulse (!) 102   Temp 97.8 F (36.6 C)   Resp 16   Ht 5' 7 (1.702 m)   Wt 93.2 kg   SpO2 100%   BMI 32.18 kg/m   QB 400 mL/ min via AVF, UF goal 2L  Tolerating treatment without complaints at this time.   Leandra Pro MD [facility] [phone] 11:11 AM

## 2023-12-01 NOTE — Progress Notes (Signed)
   12/01/23 1214  Vitals  Temp (!) 97.3 F (36.3 C)  Pulse Rate 85  Resp 16  BP (!) 135/56  SpO2 100 %  O2 Device Room Air  Weight 90.1 kg  Type of Weight Post-Dialysis  Post Treatment  Dialyzer Clearance Lightly streaked  Hemodialysis Intake (mL) 0 mL  Liters Processed 84  Fluid Removed (mL) 2500 mL  Tolerated HD Treatment Yes  AVG/AVF Arterial Site Held (minutes) 5 minutes  AVG/AVF Venous Site Held (minutes) 5 minutes   Received patient in bed to unit.  Alert and oriented x 2  Informed consent signed and in chart.   TX duration: Three hours and thirty minutes  Patient tolerated well.  Access used: Right upper arm fistula Access issues: None

## 2023-12-01 NOTE — Plan of Care (Signed)
  Problem: Education: Goal: Ability to describe self-care measures that may prevent or decrease complications (Diabetes Survival Skills Education) will improve Outcome: Progressing Goal: Individualized Educational Video(s) Outcome: Progressing   Problem: Coping: Goal: Ability to adjust to condition or change in health will improve Outcome: Progressing   Problem: Fluid Volume: Goal: Ability to maintain a balanced intake and output will improve Outcome: Progressing   Problem: Health Behavior/Discharge Planning: Goal: Ability to identify and utilize available resources and services will improve Outcome: Progressing Goal: Ability to manage health-related needs will improve Outcome: Progressing   Problem: Metabolic: Goal: Ability to maintain appropriate glucose levels will improve Outcome: Progressing   Problem: Nutritional: Goal: Maintenance of adequate nutrition will improve Outcome: Progressing Goal: Progress toward achieving an optimal weight will improve Outcome: Progressing   Problem: Skin Integrity: Goal: Risk for impaired skin integrity will decrease Outcome: Progressing   Problem: Tissue Perfusion: Goal: Adequacy of tissue perfusion will improve Outcome: Progressing   Problem: Education: Goal: Knowledge of General Education information will improve Description: Including pain rating scale, medication(s)/side effects and non-pharmacologic comfort measures Outcome: Progressing   Problem: Health Behavior/Discharge Planning: Goal: Ability to manage health-related needs will improve Outcome: Progressing   Problem: Clinical Measurements: Goal: Ability to maintain clinical measurements within normal limits will improve Outcome: Progressing Goal: Will remain free from infection Outcome: Progressing Goal: Diagnostic test results will improve Outcome: Progressing Goal: Respiratory complications will improve Outcome: Progressing Goal: Cardiovascular complication will  be avoided Outcome: Progressing   Problem: Activity: Goal: Risk for activity intolerance will decrease Outcome: Progressing   Problem: Nutrition: Goal: Adequate nutrition will be maintained Outcome: Progressing   Problem: Coping: Goal: Level of anxiety will decrease Outcome: Progressing   Problem: Elimination: Goal: Will not experience complications related to bowel motility Outcome: Progressing Goal: Will not experience complications related to urinary retention Outcome: Progressing   Problem: Pain Managment: Goal: General experience of comfort will improve and/or be controlled Outcome: Progressing   Problem: Safety: Goal: Ability to remain free from injury will improve Outcome: Progressing   Problem: Skin Integrity: Goal: Risk for impaired skin integrity will decrease Outcome: Progressing   Problem: Education: Goal: Knowledge of disease or condition will improve Outcome: Progressing Goal: Knowledge of secondary prevention will improve (MUST DOCUMENT ALL) Outcome: Progressing Goal: Knowledge of patient specific risk factors will improve (DELETE if not current risk factor) Outcome: Progressing   Problem: Ischemic Stroke/TIA Tissue Perfusion: Goal: Complications of ischemic stroke/TIA will be minimized Outcome: Progressing   Problem: Coping: Goal: Will verbalize positive feelings about self Outcome: Progressing Goal: Will identify appropriate support needs Outcome: Progressing   Problem: Health Behavior/Discharge Planning: Goal: Ability to manage health-related needs will improve Outcome: Progressing Goal: Goals will be collaboratively established with patient/family Outcome: Progressing   Problem: Self-Care: Goal: Ability to participate in self-care as condition permits will improve Outcome: Progressing Goal: Verbalization of feelings and concerns over difficulty with self-care will improve Outcome: Progressing Goal: Ability to communicate needs  accurately will improve Outcome: Progressing   Problem: Nutrition: Goal: Risk of aspiration will decrease Outcome: Progressing Goal: Dietary intake will improve Outcome: Progressing   Problem: Activity: Goal: Ability to tolerate increased activity will improve Outcome: Progressing   Problem: Clinical Measurements: Goal: Ability to maintain a body temperature in the normal range will improve Outcome: Progressing   Problem: Respiratory: Goal: Ability to maintain adequate ventilation will improve Outcome: Progressing Goal: Ability to maintain a clear airway will improve Outcome: Progressing

## 2023-12-01 NOTE — Progress Notes (Signed)
 PROGRESS NOTE    Jennifer [name]  WGN:[id number] DOB: 1955/10/28 DOA: 11/27/2023 PCP: Clemens Curt, MD  Chief Complaint  Patient presents with   Respiratory Distress    Brief Narrative:   Jennifer [name] is a 68 y.o. female with medical history significant of hypertension, CAD, CVA, diabetes mellitus type 2, ESRD on HD, hypothyroidism, anxiety, depression, and memory loss  altered mental status and hypoglycemia.   Records note patient had just recently been hospitalized 6/9 - 6/12 for acute left-sided weakness without clear signs of CVA on MRI or seizure by EEG.  During hospital stay patient was noted to have E. coli and Proteus in urine cultures and started on Rocephin  6/12 completing course prior to discharge.  During her stay she was also transition to a Monday, Wednesday, Friday schedule for hemodialysis.   She was brought to the hospital from [facility] rehab facility after last night, she was noted to be altered and unresponsive with a low blood sugar level in the fifties. Her oxygen  saturation was also low, in the 60s, and she was given a Duoneb treatment, which only increased her oxygen  saturation to 80-81. She was lethargic and not responding well, leading to a call to emergency services.   Assessment & Plan:    Acute respiratory failure with hypoxia to combination of PE and community-acquired pneumonia. On heparin  drip for PE, being switched to Eliquis  on 11/30/2023, empiric antibiotics for community-acquired pneumonia, no clinical signs of sepsis.  Although likely was septic at the time of admission with hypothermia and hypoglycemia, all sepsis pathophysiology has resolved.  Lower extremity venous duplex unremarkable, echo stable.  Encouraged to sit in chair use I-S and flutter valve for pulmonary toiletry, currently on room air.  Acute Pulmonary Embolism with Possible Pulmonary Infarct As above  Community Acquired Pneumonia As above.  Follow cultures   Acute metabolic  encephalopathy History of Dementia with small punctate lacunar infarct. Baseline dementia, mild intermittent confusion in the hospital, MRI brain with possible punctate acute lacunar infarct - will discuss with neurology, discussed with Dr. Janett Medin on 11/29/2023. Full stroke workup, continue Eliquis , on high-dose Lipitor  which will be continued add Zetia  for better LDL control, A1c stable.    ESRD on HD Patient currently on a Monday, Wednesday, Friday schedule.  - Nephrology consulted on need of hemodialysis.   Hypertension - Continue metoprolol  and furosemide  as tolerated   Hypothyroidism TSH noted to be within normal limits at 1.353 when checked on 11/20/2023 - Continue levothyroxine    Normocytic anemia Chronic and stable monitor.   Right Shin Hematoma Outline, monitor closely with need for heparin  with PE No acute fx/dislocation on tib fib plain films X-ray R Knee, tibia & fibula  unremarkable  History of CVA Hyperlipidemia - Continue aspirin  and atorvastatin    Memory loss - Delirium precautions - Continue Namenda    Anxiety  - Continue current medication regimen  Lives with husband/daughter.  At baseline, before last hospitalization, could transfer, but no extended walking with assistive device.  Wheelchair bound for past year.  Hypoglycemia  Diabetes mellitus type 2 with hypoglycemia due to insulin  Hypoglycemia to 17 on admission Lantus  and sliding scale, dose adjusted monitor closely, may need down titration of her insulin  upon discharge, diabetic and insulin  education.  Lab Results  Component Value Date   HGBA1C 5.7 (H) 11/29/2023   CBG (last 3)  Recent Labs    11/30/23 1550 11/30/23 2053 12/01/23 0748  GLUCAP 176* 176* 102*     DVT prophylaxis:  heparin  gtt Code Status: full Family Communication: Discussed with daughter Jennifer [name] [phone]  11/29/2023, 11/30/2023 Disposition:   Status is: Observation The patient remains OBS appropriate and will d/c  before 2 midnights.   Consultants:  Neurology Nephrology  Procedures:  none  Subjective:  Patient in bed, appears comfortable, denies any headache, no fever, no chest pain or pressure, no shortness of breath , no abdominal pain. No new focal weakness.    Objective: Vitals:   11/30/23 2005 12/01/23 0700 12/01/23 0815 12/01/23 0830  BP: (!) 150/57  (!) 149/56 (!) 150/63  Pulse: 96  89 88  Resp: 15  15 17   Temp: 98.4 F (36.9 C) 97.6 F (36.4 C) 97.8 F (36.6 C)   TempSrc: Oral Oral    SpO2: 95%  100% 98%  Weight:   93.2 kg   Height:        Intake/Output Summary (Last 24 hours) at 12/01/2023 0844 Last data filed at 11/30/2023 2200 Gross per 24 hour  Intake 3 ml  Output --  Net 3 ml   Filed Weights   11/29/23 0915 11/29/23 1245 12/01/23 0815  Weight: 94.2 kg 92 kg 93.2 kg    Examination:  Awake mildly confused, No new F.N deficits, chronic R. Facial droop [place].AT,PERRAL Supple Neck, No JVD,   Symmetrical Chest wall movement, Good air movement bilaterally, CTAB RRR,No Gallops, Rubs or new Murmurs,  +ve B.Sounds, Abd Soft, No tenderness,   Multiple chronic bruises, stable hematoma overlying the right anterior aspect of the tibia    Data Review:   Inpatient Medications  Scheduled Meds:  apixaban   10 mg Oral BID   Followed by   Cecily Cohen ON 12/05/2023] apixaban   5 mg Oral BID   atorvastatin   80 mg Oral QHS   buPROPion   150 mg Oral BID   carvedilol   12.5 mg Oral Q M,W,F   Chlorhexidine  Gluconate Cloth  6 each Topical Q0600   doxycycline   100 mg Oral Q12H   ezetimibe   10 mg Oral Daily   furosemide   80 mg Oral Q M,W,F   insulin  aspart  0-9 Units Subcutaneous TID WC   levothyroxine   175 mcg Oral QAC breakfast   memantine   10 mg Oral BID   sodium chloride  flush  3 mL Intravenous Q12H   Continuous Infusions:  cefTRIAXone  (ROCEPHIN )  IV 2 g (12/01/23 0546)   PRN Meds:.acetaminophen  **OR** acetaminophen , albuterol   DVT Prophylaxis    Recent Labs  Lab  11/25/23 0300 11/25/23 0724 11/27/23 0343 11/27/23 0358 11/27/23 0359 11/28/23 0532 11/29/23 0548 11/30/23 0608  WBC 7.0  --  11.4*  --   --  11.4* 7.4 7.3  HGB 8.0*   < > 9.6* 9.9* 8.5* 7.5* 8.4* 9.1*  HCT 26.5*   < > 30.8* 29.0* 25.0* 24.2* 27.3* 30.1*  PLT 219  --  266  --   --  253 240 311  MCV 99.3  --  99.7  --   --  100.8* 100.0 102.0*  MCH 30.0  --  31.1  --   --  31.3 30.8 30.8  MCHC 30.2  --  31.2  --   --  31.0 30.8 30.2  RDW 15.7*  --  15.9*  --   --  15.9* 16.2* 16.1*  LYMPHSABS  --   --  0.7  --   --   --  2.5  --   MONOABS  --   --  0.6  --   --   --  0.6  --   EOSABS  --   --  0.0  --   --   --  0.2  --   BASOSABS  --   --  0.1  --   --   --  0.0  --    < > = values in this interval not displayed.    Recent Labs  Lab 11/25/23 0300 11/26/23 0252 11/27/23 0343 11/27/23 0358 11/27/23 0359 11/27/23 0502 11/27/23 0739 11/28/23 0532 11/29/23 0548 11/29/23 1410 11/30/23 0608  NA 136 134* 137 137 135  --   --  136 136  --  138  K 4.4 3.7 4.6 4.4 4.4  --   --  4.1 4.2  --  4.5  CL 100 97* 98 96*  --   --   --  95* 95*  --  97*  CO2 28 25 28   --   --   --   --  27 27  --  27  ANIONGAP 8 12 11   --   --   --   --  14 14  --  14  GLUCOSE 131* 124* 109* 108*  --   --   --  153* 100*  --  131*  BUN 59* 33* 45* 45*  --   --   --  30* 38*  --  27*  CREATININE 5.20* 4.02* 5.64* 6.00*  --   --   --  4.59* 6.06*  --  4.54*  AST  --   --  21  --   --   --   --   --  16  --   --   ALT  --   --  13  --   --   --   --   --  14  --   --   ALKPHOS  --   --  54  --   --   --   --   --  43  --   --   BILITOT  --   --  0.6  --   --   --   --   --  0.6  --   --   ALBUMIN 2.1* 2.3* 2.2*  --   --   --   --  2.3* 2.1*  --  2.3*  DDIMER  --   --   --   --   --   --  1.14*  --   --   --   --   PROCALCITON  --   --  0.49  --   --   --   --   --   --   --   --   LATICACIDVEN  --   --   --   --  0.7  --   --   --   --   --   --   INR  --   --  1.1  --   --   --   --   --   --   --   --    HGBA1C  --   --   --   --   --   --   --   --   --  5.7*  --   AMMONIA  --   --   --   --   --  22  --   --   --   --   --  BNP  --   --  36.6  --   --   --   --  50.2  --   --   --   MG 2.3  --   --   --   --   --   --   --  2.2  --   --   PHOS 4.8* 3.4  --   --   --   --   --  3.6 4.8*  --  4.0  CALCIUM  7.9* 7.8* 8.6*  --   --   --   --  8.5* 8.4*  --  8.5*      Recent Labs  Lab 11/25/23 0300 11/26/23 0252 11/27/23 0343 11/27/23 0359 11/27/23 0502 11/27/23 0739 11/28/23 0532 11/29/23 0548 11/29/23 1410 11/30/23 4401  DDIMER  --   --   --   --   --  1.14*  --   --   --   --   PROCALCITON  --   --  0.49  --   --   --   --   --   --   --   LATICACIDVEN  --   --   --  0.7  --   --   --   --   --   --   INR  --   --  1.1  --   --   --   --   --   --   --   HGBA1C  --   --   --   --   --   --   --   --  5.7*  --   AMMONIA  --   --   --   --  22  --   --   --   --   --   BNP  --   --  36.6  --   --   --  50.2  --   --   --   MG 2.3  --   --   --   --   --   --  2.2  --   --   CALCIUM  7.9* 7.8* 8.6*  --   --   --  8.5* 8.4*  --  8.5*    --------------------------------------------------------------------------------------------------------------- Lab Results  Component Value Date   CHOL 157 11/29/2023   HDL 67 11/29/2023   LDLCALC 76 11/29/2023   LDLDIRECT 154.2 11/04/2011   TRIG 68 11/29/2023   CHOLHDL 2.3 11/29/2023    Lab Results  Component Value Date   HGBA1C 5.7 (H) 11/29/2023    Radiology Reports  VAS US  CAROTID (at [facility] and [facility] only) Result Date: 11/30/2023 Carotid Arterial Duplex Study Patient Name:  Jennifer [name]  Date of Exam:   11/29/2023 Medical Rec #: [id number]      Accession #:    [id number] Date of Birth: 06/25/1955     Patient Gender: F Patient Age:   48 years Exam Location:  [facility] Procedure:      VAS US  CAROTID Referring Phys: Margart Shears DE LA TORRE --------------------------------------------------------------------------------  Indications:        CVA. Risk Factors:      Hypertension, hyperlipidemia, Diabetes, past history of                    smoking, prior CVA. Other Factors:     CKD, RUE dialysis access. Comparison Study:  06/12/2023- no hemodynamically significant stenosis  by duplex                    criteria in the extracranial cerebrovascular circulation. Performing Technologist: Vonzell Guerin, RVT  Examination Guidelines: A complete evaluation includes B-mode imaging, spectral Doppler, color Doppler, and power Doppler as needed of all accessible portions of each vessel. Bilateral testing is considered an integral part of a complete examination. Limited examinations for reoccurring indications may be performed as noted.  Right Carotid Findings: +----------+--------+--------+--------+------------------+------------------+           PSV cm/sEDV cm/sStenosisPlaque DescriptionComments           +----------+--------+--------+--------+------------------+------------------+ CCA Prox  96      0                                                    +----------+--------+--------+--------+------------------+------------------+ CCA Distal62      9                                                    +----------+--------+--------+--------+------------------+------------------+ ICA Prox  59      11      1-39%                     intimal thickening +----------+--------+--------+--------+------------------+------------------+ ICA Mid   79      15                                                   +----------+--------+--------+--------+------------------+------------------+ ICA Distal71      16                                                   +----------+--------+--------+--------+------------------+------------------+ ECA       103                                                          +----------+--------+--------+--------+------------------+------------------+  +----------+--------+-------+---------------+-------------------+           PSV cm/sEDV cmsDescribe       Arm Pressure (mmHG) +----------+--------+-------+---------------+-------------------+ [id number]            dialysis access                    +----------+--------+-------+---------------+-------------------+ +---------+--------+--+--------+--+---------+ VertebralPSV cm/s54EDV cm/s10Antegrade +---------+--------+--+--------+--+---------+  Left Carotid Findings: +----------+--------+--------+--------+------------------+------------------+           PSV cm/sEDV cm/sStenosisPlaque DescriptionComments           +----------+--------+--------+--------+------------------+------------------+ CCA Prox  111     0                                                    +----------+--------+--------+--------+------------------+------------------+  CCA Distal86      13                                intimal thickening +----------+--------+--------+--------+------------------+------------------+ ICA Prox  76      20                                                   +----------+--------+--------+--------+------------------+------------------+ ICA Distal84      18                                                   +----------+--------+--------+--------+------------------+------------------+ ECA       70                                                           +----------+--------+--------+--------+------------------+------------------+ +----------+--------+--------+--------+-------------------+           PSV cm/sEDV cm/sDescribeArm Pressure (mmHG) +----------+--------+--------+--------+-------------------+ Subclavian216             Stenotic                    +----------+--------+--------+--------+-------------------+ +---------+--------+--+--------+--+---------+ VertebralPSV cm/s70EDV cm/s14Antegrade +---------+--------+--+--------+--+---------+    Summary: Right Carotid: Velocities in the right ICA are consistent with a 1-39% stenosis. Left Carotid: The extracranial vessels were near-normal with only minimal wall               thickening or plaque. Vertebrals:  Bilateral vertebral arteries demonstrate antegrade flow. Subclavians: Left subclavian artery was stenotic. Monophasic low-resistive flow              in right subclavian artery due to dialysis access. *See table(s) above for measurements and observations.  Electronically signed by Ardella Beaver MD on 11/30/2023 at 10:37:22 AM.    Final    VAS US  LOWER EXTREMITY VENOUS (DVT) Result Date: 11/29/2023  Lower Venous DVT Study Patient Name:  Jennifer [name]  Date of Exam:   11/29/2023 Medical Rec #: [id number]      Accession #:    [id number] Date of Birth: May 21, 1956     Patient Gender: F Patient Age:   66 years Exam Location:  [facility] Procedure:      VAS US  LOWER EXTREMITY VENOUS (DVT) Referring Phys: A POWELL JR --------------------------------------------------------------------------------  Indications: Pulmonary embolism.  Comparison Study: 05/21/2023 - negative Performing Technologist: Vonzell Guerin, RVT  Examination Guidelines: A complete evaluation includes B-mode imaging, spectral Doppler, color Doppler, and power Doppler as needed of all accessible portions of each vessel. Bilateral testing is considered an integral part of a complete examination. Limited examinations for reoccurring indications may be performed as noted. The reflux portion of the exam is performed with the patient in reverse Trendelenburg.  +---------+---------------+---------+-----------+----------+--------------+ RIGHT    CompressibilityPhasicitySpontaneityPropertiesThrombus Aging +---------+---------------+---------+-----------+----------+--------------+ CFV      Full           Yes      Yes                                 +---------+---------------+---------+-----------+----------+--------------+  SFJ       Full                                                        +---------+---------------+---------+-----------+----------+--------------+ FV Prox  Full                                                        +---------+---------------+---------+-----------+----------+--------------+ FV Mid   Full                                                        +---------+---------------+---------+-----------+----------+--------------+ FV DistalFull                                                        +---------+---------------+---------+-----------+----------+--------------+ PFV      Full                                                        +---------+---------------+---------+-----------+----------+--------------+ POP      Full           Yes      Yes                                 +---------+---------------+---------+-----------+----------+--------------+ PTV      Full                                                        +---------+---------------+---------+-----------+----------+--------------+ PERO     Full                                                        +---------+---------------+---------+-----------+----------+--------------+   +---------+---------------+---------+-----------+----------+--------------+ LEFT     CompressibilityPhasicitySpontaneityPropertiesThrombus Aging +---------+---------------+---------+-----------+----------+--------------+ CFV      Full           Yes      Yes                                 +---------+---------------+---------+-----------+----------+--------------+ SFJ      Full                                                        +---------+---------------+---------+-----------+----------+--------------+  FV Prox  Full                                                        +---------+---------------+---------+-----------+----------+--------------+ FV Mid   Full                                                         +---------+---------------+---------+-----------+----------+--------------+ FV DistalFull                                                        +---------+---------------+---------+-----------+----------+--------------+ PFV      Full                                                        +---------+---------------+---------+-----------+----------+--------------+ POP      Full           Yes      Yes                                 +---------+---------------+---------+-----------+----------+--------------+ PTV      Full                                                        +---------+---------------+---------+-----------+----------+--------------+ PERO                                                  not well seen  +---------+---------------+---------+-----------+----------+--------------+ The left peroneal veins are not seen well.    Summary: BILATERAL: - No evidence of deep vein thrombosis seen in the lower extremities, bilaterally. -No evidence of popliteal cyst, bilaterally.  LEFT: - There is no evidence of deep vein thrombosis in the lower extremity. However, portions of this examination were limited- see technologist comments above.  *See table(s) above for measurements and observations. Electronically signed by Irvin Mantel on 11/29/2023 at 7:05:40 PM.    Final       Signature  -   Lynnwood Sauer M.D on 12/01/2023 at 8:44 AM   -  To page go to [email or web address]

## 2023-12-01 NOTE — Progress Notes (Signed)
 Nephrologist and renal NP notified that pt can start at [facility] on MWF 12:00 pm chair time at d/c. This was arranged when pt was d/c from last hospitalization but pt never started at clinic. For first day of treatment, pt would likely need to arrive around 11:15 am. Will provide update to clinic on pt's d/c date and start date once known. Will assist as needed.   Lauraine Polite Renal Navigator [phone]

## 2023-12-01 NOTE — Progress Notes (Signed)
 [facility] Progress Note   Assessment/ Plan:   1 Acute hypoxic RF: CXR negative.  ? Aspiration vs edema-- but would expect to see on CXR.  Regardless- she does look a little volume overloaded. CTA + for PE, on antibiotics too for CAP On Eliquis  for PE 2 ESRD: MWF for now at [facility]- will do 4 hr rx with UF first and then HD.  Will need to use butthonholes.  HD 6/18, next 6/20  No heparin  with HD 3 Hypertension:  at her normal Bps now 4. Anemia of ESRD:  will dose mircera when indicated 5. Metabolic Bone Disease:  binders/ vits  6.  Dispo admitted  Subjective:    Looking better today, more perky/ alert.  No sisues.   Objective:   BP 131/61   Pulse (!) 102   Temp 97.8 F (36.6 C)   Resp 16   Ht 5' 7 (1.702 m)   Wt 93.2 kg   SpO2 100%   BMI 32.18 kg/m   Physical Exam: GEN nad, lying in bed HEENT EOMI PERRL NECK + JVD PULM clear anteriorly CV RRR ABD soft EXT 1+ anasarca NEURO AAO x 3 SKIN warm and dry ACCESS:  R AVF + T/b  Labs: BMET Recent Labs  Lab 11/25/23 0300 11/26/23 0252 11/27/23 0343 11/27/23 0358 11/27/23 0359 11/28/23 0532 11/29/23 0548 11/30/23 0608 12/01/23 0731  NA 136 134* 137 137 135 136 136 138 135  K 4.4 3.7 4.6 4.4 4.4 4.1 4.2 4.5 4.2  CL 100 97* 98 96*  --  95* 95* 97* 96*  CO2 28 25 28   --   --  27 27 27 26   GLUCOSE 131* 124* 109* 108*  --  153* 100* 131* 154*  BUN 59* 33* 45* 45*  --  30* 38* 27* 46*  CREATININE 5.20* 4.02* 5.64* 6.00*  --  4.59* 6.06* 4.54* 5.89*  CALCIUM  7.9* 7.8* 8.6*  --   --  8.5* 8.4* 8.5* 8.6*  PHOS 4.8* 3.4  --   --   --  3.6 4.8* 4.0 5.0*   CBC Recent Labs  Lab 11/27/23 0343 11/27/23 0358 11/28/23 0532 11/29/23 0548 11/30/23 0608 12/01/23 0731  WBC 11.4*  --  11.4* 7.4 7.3 6.6  NEUTROABS 10.0*  --   --  3.9  --   --   HGB 9.6*   < > 7.5* 8.4* 9.1* 8.2*  HCT 30.8*   < > 24.2* 27.3* 30.1* 27.0*  MCV 99.7  --  100.8* 100.0 102.0* 99.3  PLT 266  --  253 240 311 284   < > = values in this  interval not displayed.      Medications:     apixaban   10 mg Oral BID   Followed by   Cecily Cohen ON 12/05/2023] apixaban   5 mg Oral BID   atorvastatin   80 mg Oral QHS   buPROPion   150 mg Oral BID   carvedilol   12.5 mg Oral Q M,W,F   Chlorhexidine  Gluconate Cloth  6 each Topical Q0600   doxycycline   100 mg Oral Q12H   ezetimibe   10 mg Oral Daily   furosemide   80 mg Oral Q M,W,F   insulin  aspart  0-9 Units Subcutaneous TID WC   levothyroxine   175 mcg Oral QAC breakfast   memantine   10 mg Oral BID   sodium chloride  flush  3 mL Intravenous Q12H     Leandra Pro MD 12/01/2023, 11:11 AM

## 2023-12-02 DIAGNOSIS — R401 Stupor: Secondary | ICD-10-CM | POA: Diagnosis not present

## 2023-12-02 LAB — CULTURE, BLOOD (ROUTINE X 2)
Culture: NO GROWTH
Culture: NO GROWTH
Special Requests: ADEQUATE
Special Requests: ADEQUATE

## 2023-12-02 LAB — GLUCOSE, CAPILLARY
Glucose-Capillary: 162 mg/dL — ABNORMAL HIGH (ref 70–99)
Glucose-Capillary: 278 mg/dL — ABNORMAL HIGH (ref 70–99)
Glucose-Capillary: 177 mg/dL — ABNORMAL HIGH (ref 70–99)
Glucose-Capillary: 146 mg/dL — ABNORMAL HIGH (ref 70–99)

## 2023-12-02 MED ORDER — DARBEPOETIN ALFA 60 MCG/0.3ML IJ SOSY: 60.0000 ug | PREFILLED_SYRINGE | INTRAMUSCULAR | Status: DC

## 2023-12-02 NOTE — Progress Notes (Signed)
 PROGRESS NOTE    Jennifer [name]  FMW:[id number] DOB: March 12, 1956 DOA: 11/27/2023 PCP: Randeen Laine LABOR, MD  Chief Complaint  Patient presents with   Respiratory Distress    Brief Narrative:   Jennifer [name] is a 68 y.o. female with medical history significant of hypertension, CAD, CVA, diabetes mellitus type 2, ESRD on HD, hypothyroidism, anxiety, depression, and memory loss  altered mental status and hypoglycemia.   Records note patient had just recently been hospitalized 6/9 - 6/12 for acute left-sided weakness without clear signs of CVA on MRI or seizure by EEG.  During hospital stay patient was noted to have E. coli and Proteus in urine cultures and started on Rocephin  6/12 completing course prior to discharge.  During her stay she was also transition to a Monday, Wednesday, Friday schedule for hemodialysis.   She was brought to the hospital from [facility] rehab facility after last night, she was noted to be altered and unresponsive with a low blood sugar level in the fifties. Her oxygen  saturation was also low, in the 60s, and she was given a Duoneb treatment, which only increased her oxygen  saturation to 80-81. She was lethargic and not responding well, leading to a call to emergency services.   Assessment & Plan:    Acute respiratory failure with hypoxia to combination of PE and community-acquired pneumonia. On heparin  drip for PE, being switched to Eliquis  on 11/30/2023, empiric antibiotics for community-acquired pneumonia, no clinical signs of sepsis.  Although likely was septic at the time of admission with hypothermia and hypoglycemia, all sepsis pathophysiology has resolved.  Lower extremity venous duplex unremarkable, echo stable.  Encouraged to sit in chair use I-S and flutter valve for pulmonary toiletry, currently on room air.  Acute Pulmonary Embolism with Possible Pulmonary Infarct As above  Community Acquired Pneumonia As above.  Follow cultures   Acute metabolic  encephalopathy History of Dementia with small punctate lacunar infarct. Baseline dementia, mild intermittent confusion in the hospital, MRI brain with possible punctate acute lacunar infarct - will discuss with neurology, discussed with Dr. Rosemarie on 11/29/2023. Full stroke workup, continue Eliquis , on high-dose Lipitor  which will be continued add Zetia  for better LDL control, A1c stable.    ESRD on HD Patient currently on a Monday, Wednesday, Friday schedule.  - Nephrology consulted on need of hemodialysis.   Hypertension - Continue metoprolol  and furosemide  as tolerated   Hypothyroidism TSH noted to be within normal limits at 1.353 when checked on 11/20/2023 - Continue levothyroxine    Normocytic anemia Chronic and stable monitor.   Right Shin Hematoma Outline, monitor closely with need for heparin  with PE No acute fx/dislocation on tib fib plain films X-ray R Knee, tibia & fibula  unremarkable  History of CVA Hyperlipidemia - Continue aspirin  and atorvastatin    Memory loss - Delirium precautions - Continue Namenda    Anxiety  - Continue current medication regimen  Lives with husband/daughter.  At baseline, before last hospitalization, could transfer, but no extended walking with assistive device.  Wheelchair bound for past year.  Hypoglycemia  Diabetes mellitus type 2 with hypoglycemia due to insulin  Hypoglycemia to 17 on admission Lantus  and sliding scale, dose adjusted monitor closely, may need down titration of her insulin  upon discharge, diabetic and insulin  education.  Lab Results  Component Value Date   HGBA1C 5.7 (H) 11/29/2023   CBG (last 3)  Recent Labs    12/01/23 1517 12/01/23 2108 12/02/23 0751  GLUCAP 110* 249* 162*     DVT prophylaxis:  heparin  gtt Code Status: full Family Communication: Discussed with daughter Marolyn [phone]  11/29/2023, 11/30/2023 Disposition:   Status is: Observation The patient remains OBS appropriate and will d/c  before 2 midnights.   Consultants:  Neurology Nephrology  Procedures:  none  Subjective:  Patient in bed, appears comfortable, denies any headache, no fever, no chest pain or pressure, no shortness of breath , no abdominal pain. No new focal weakness.     Objective: Vitals:   12/01/23 1724 12/01/23 2000 12/02/23 0000 12/02/23 0752  BP: 137/71 (!) 124/53 (!) 101/48 (!) 131/48  Pulse:  (!) 110 (!) 101 87  Resp: 18 16 18 15   Temp:  97.9 F (36.6 C)  98 F (36.7 C)  TempSrc:  Oral  Oral  SpO2:  93% 97% 97%  Weight:      Height:        Intake/Output Summary (Last 24 hours) at 12/02/2023 0813 Last data filed at 12/01/2023 2138 Gross per 24 hour  Intake 3 ml  Output 2500 ml  Net -2497 ml   Filed Weights   11/29/23 1245 12/01/23 0815 12/01/23 1214  Weight: 92 kg 93.2 kg 90.1 kg    Examination:  Awake mildly confused, No new F.N deficits, chronic R. Facial droop [place].AT,PERRAL Supple Neck, No JVD,   Symmetrical Chest wall movement, Good air movement bilaterally, CTAB RRR,No Gallops, Rubs or new Murmurs,  +ve B.Sounds, Abd Soft, No tenderness,   Multiple chronic bruises, stable hematoma overlying the right anterior aspect of the tibia    Data Review:   Inpatient Medications  Scheduled Meds:  apixaban   10 mg Oral BID   Followed by   NOREEN ON 12/05/2023] apixaban   5 mg Oral BID   atorvastatin   80 mg Oral QHS   buPROPion   150 mg Oral BID   carvedilol   12.5 mg Oral Q M,W,F   Chlorhexidine  Gluconate Cloth  6 each Topical Q0600   doxycycline   100 mg Oral Q12H   ezetimibe   10 mg Oral Daily   furosemide   80 mg Oral Q M,W,F   insulin  aspart  0-9 Units Subcutaneous TID WC   levothyroxine   175 mcg Oral QAC breakfast   memantine   10 mg Oral BID   sodium chloride  flush  3 mL Intravenous Q12H   Continuous Infusions:  cefTRIAXone  (ROCEPHIN )  IV 2 g (12/02/23 0608)   PRN Meds:.acetaminophen  **OR** acetaminophen , albuterol   DVT Prophylaxis    Recent Labs  Lab  11/27/23 0343 11/27/23 0358 11/27/23 0359 11/28/23 0532 11/29/23 0548 11/30/23 0608 12/01/23 0731  WBC 11.4*  --   --  11.4* 7.4 7.3 6.6  HGB 9.6*   < > 8.5* 7.5* 8.4* 9.1* 8.2*  HCT 30.8*   < > 25.0* 24.2* 27.3* 30.1* 27.0*  PLT 266  --   --  253 240 311 284  MCV 99.7  --   --  100.8* 100.0 102.0* 99.3  MCH 31.1  --   --  31.3 30.8 30.8 30.1  MCHC 31.2  --   --  31.0 30.8 30.2 30.4  RDW 15.9*  --   --  15.9* 16.2* 16.1* 15.9*  LYMPHSABS 0.7  --   --   --  2.5  --   --   MONOABS 0.6  --   --   --  0.6  --   --   EOSABS 0.0  --   --   --  0.2  --   --   BASOSABS 0.1  --   --   --  0.0  --   --    < > = values in this interval not displayed.    Recent Labs  Lab 11/26/23 0252 11/27/23 0343 11/27/23 0358 11/27/23 0359 11/27/23 0502 11/27/23 0739 11/28/23 0532 11/29/23 0548 11/29/23 1410 11/30/23 0608 12/01/23 0731  NA 134* 137 137 135  --   --  136 136  --  138 135  K 3.7 4.6 4.4 4.4  --   --  4.1 4.2  --  4.5 4.2  CL 97* 98 96*  --   --   --  95* 95*  --  97* 96*  CO2 25 28  --   --   --   --  27 27  --  27 26  ANIONGAP 12 11  --   --   --   --  14 14  --  14 13  GLUCOSE 124* 109* 108*  --   --   --  153* 100*  --  131* 154*  BUN 33* 45* 45*  --   --   --  30* 38*  --  27* 46*  CREATININE 4.02* 5.64* 6.00*  --   --   --  4.59* 6.06*  --  4.54* 5.89*  AST  --  21  --   --   --   --   --  16  --   --   --   ALT  --  13  --   --   --   --   --  14  --   --   --   ALKPHOS  --  54  --   --   --   --   --  43  --   --   --   BILITOT  --  0.6  --   --   --   --   --  0.6  --   --   --   ALBUMIN 2.3* 2.2*  --   --   --   --  2.3* 2.1*  --  2.3* 2.3*  DDIMER  --   --   --   --   --  1.14*  --   --   --   --   --   PROCALCITON  --  0.49  --   --   --   --   --   --   --   --   --   LATICACIDVEN  --   --   --  0.7  --   --   --   --   --   --   --   INR  --  1.1  --   --   --   --   --   --   --   --   --   HGBA1C  --   --   --   --   --   --   --   --  5.7*  --   --   AMMONIA   --   --   --   --  22  --   --   --   --   --   --   BNP  --  36.6  --   --   --   --  50.2  --   --   --   --   MG  --   --   --   --   --   --   --  2.2  --   --   --   PHOS 3.4  --   --   --   --   --  3.6 4.8*  --  4.0 5.0*  CALCIUM  7.8* 8.6*  --   --   --   --  8.5* 8.4*  --  8.5* 8.6*      Recent Labs  Lab 11/27/23 0343 11/27/23 0359 11/27/23 0502 11/27/23 0739 11/28/23 0532 11/29/23 0548 11/29/23 1410 11/30/23 0608 12/01/23 0731  DDIMER  --   --   --  1.14*  --   --   --   --   --   PROCALCITON 0.49  --   --   --   --   --   --   --   --   LATICACIDVEN  --  0.7  --   --   --   --   --   --   --   INR 1.1  --   --   --   --   --   --   --   --   HGBA1C  --   --   --   --   --   --  5.7*  --   --   AMMONIA  --   --  22  --   --   --   --   --   --   BNP 36.6  --   --   --  50.2  --   --   --   --   MG  --   --   --   --   --  2.2  --   --   --   CALCIUM  8.6*  --   --   --  8.5* 8.4*  --  8.5* 8.6*    --------------------------------------------------------------------------------------------------------------- Lab Results  Component Value Date   CHOL 157 11/29/2023   HDL 67 11/29/2023   LDLCALC 76 11/29/2023   LDLDIRECT 154.2 11/04/2011   TRIG 68 11/29/2023   CHOLHDL 2.3 11/29/2023    Lab Results  Component Value Date   HGBA1C 5.7 (H) 11/29/2023    Radiology Reports  No results found.     Signature  -   Lavada Stank M.D on 12/02/2023 at 8:13 AM   -  To page go to [email or web address]

## 2023-12-02 NOTE — Progress Notes (Signed)
 [facility] Progress Note   Assessment/ Plan:   1 Acute hypoxic RF: CXR negative.  ? Aspiration vs edema-- but would expect to see on CXR.  Regardless- she does look a little volume overloaded. CTA + for PE, on antibiotics too for CAP On Eliquis  for PE 2 ESRD: MWF for now at [facility]- will do 4 hr rx with UF first and then HD.  Will need to use butthonholes.  HD 6/18, 6/20  No heparin  with HD  Next HD 12/04/23 3 Hypertension:  at her normal Bps now 4. Anemia of ESRD:  ESA for today 5. Metabolic Bone Disease:  binders/ vits  6.  Dispo admitted  Subjective:    HD yesterday, 2.5L off.  Family at bedside.   Objective:   BP (!) 128/44 (BP Location: Left Arm)   Pulse 93   Temp 97.6 F (36.4 C) (Oral)   Resp 18   Ht 5' 7 (1.702 m)   Wt 90.1 kg   SpO2 99%   BMI 31.11 kg/m   Physical Exam: GEN nad, lying in bed HEENT EOMI PERRL NECK JVD improved PULM clear anteriorly CV RRR ABD soft EXT 1+ anasarca NEURO AAO x 3 SKIN warm and dry ACCESS:  R AVF + T/b  Labs: BMET Recent Labs  Lab 11/26/23 0252 11/27/23 0343 11/27/23 0358 11/27/23 0359 11/28/23 0532 11/29/23 0548 11/30/23 0608 12/01/23 0731  NA 134* 137 137 135 136 136 138 135  K 3.7 4.6 4.4 4.4 4.1 4.2 4.5 4.2  CL 97* 98 96*  --  95* 95* 97* 96*  CO2 25 28  --   --  27 27 27 26   GLUCOSE 124* 109* 108*  --  153* 100* 131* 154*  BUN 33* 45* 45*  --  30* 38* 27* 46*  CREATININE 4.02* 5.64* 6.00*  --  4.59* 6.06* 4.54* 5.89*  CALCIUM  7.8* 8.6*  --   --  8.5* 8.4* 8.5* 8.6*  PHOS 3.4  --   --   --  3.6 4.8* 4.0 5.0*   CBC Recent Labs  Lab 11/27/23 0343 11/27/23 0358 11/28/23 0532 11/29/23 0548 11/30/23 0608 12/01/23 0731  WBC 11.4*  --  11.4* 7.4 7.3 6.6  NEUTROABS 10.0*  --   --  3.9  --   --   HGB 9.6*   < > 7.5* 8.4* 9.1* 8.2*  HCT 30.8*   < > 24.2* 27.3* 30.1* 27.0*  MCV 99.7  --  100.8* 100.0 102.0* 99.3  PLT 266  --  253 240 311 284   < > = values in this interval not displayed.       Medications:     apixaban   10 mg Oral BID   Followed by   NOREEN ON 12/05/2023] apixaban   5 mg Oral BID   atorvastatin   80 mg Oral QHS   buPROPion   150 mg Oral BID   carvedilol   12.5 mg Oral Q M,W,F   Chlorhexidine  Gluconate Cloth  6 each Topical Q0600   doxycycline   100 mg Oral Q12H   ezetimibe   10 mg Oral Daily   furosemide   80 mg Oral Q M,W,F   insulin  aspart  0-9 Units Subcutaneous TID WC   levothyroxine   175 mcg Oral QAC breakfast   memantine   10 mg Oral BID   sodium chloride  flush  3 mL Intravenous Q12H     Almarie Bonine MD 12/02/2023, 5:41 PM

## 2023-12-03 DIAGNOSIS — R401 Stupor: Secondary | ICD-10-CM | POA: Diagnosis not present

## 2023-12-03 LAB — GLUCOSE, CAPILLARY
Glucose-Capillary: 154 mg/dL — ABNORMAL HIGH (ref 70–99)
Glucose-Capillary: 169 mg/dL — ABNORMAL HIGH (ref 70–99)
Glucose-Capillary: 147 mg/dL — ABNORMAL HIGH (ref 70–99)
Glucose-Capillary: 162 mg/dL — ABNORMAL HIGH (ref 70–99)

## 2023-12-03 NOTE — Progress Notes (Signed)
 PROGRESS NOTE    Jennifer [name]  FMW:[id number] DOB: Apr 23, 1956 DOA: 11/27/2023 PCP: Randeen Laine LABOR, MD  Chief Complaint  Patient presents with   Respiratory Distress    Brief Narrative:   Jennifer [name] is a 68 y.o. female with medical history significant of hypertension, CAD, CVA, diabetes mellitus type 2, ESRD on HD, hypothyroidism, anxiety, depression, and memory loss  altered mental status and hypoglycemia.   Records note patient had just recently been hospitalized 6/9 - 6/12 for acute left-sided weakness without clear signs of CVA on MRI or seizure by EEG.  During hospital stay patient was noted to have E. coli and Proteus in urine cultures and started on Rocephin  6/12 completing course prior to discharge.  During her stay she was also transition to a Monday, Wednesday, Friday schedule for hemodialysis.   She was brought to the hospital from [facility] rehab facility after last night, she was noted to be altered and unresponsive with a low blood sugar level in the fifties. Her oxygen  saturation was also low, in the 60s, and she was given a Duoneb treatment, which only increased her oxygen  saturation to 80-81. She was lethargic and not responding well, leading to a call to emergency services.   Assessment & Plan:    Acute respiratory failure with hypoxia to combination of PE and community-acquired pneumonia. On heparin  drip for PE, being switched to Eliquis  on 11/30/2023, empiric antibiotics for community-acquired pneumonia, no clinical signs of sepsis.  Although likely was septic at the time of admission with hypothermia and hypoglycemia, all sepsis pathophysiology has resolved.  Lower extremity venous duplex unremarkable, echo stable.  Encouraged to sit in chair use I-S and flutter valve for pulmonary toiletry, currently on room air.  Acute Pulmonary Embolism with Possible Pulmonary Infarct As above  Community Acquired Pneumonia As above.  Follow cultures   Acute metabolic  encephalopathy History of Dementia with small punctate lacunar infarct. Baseline dementia, mild intermittent confusion in the hospital, MRI brain with possible punctate acute lacunar infarct - will discuss with neurology, discussed with Dr. Rosemarie on 11/29/2023. Full stroke workup, continue Eliquis , on high-dose Lipitor  which will be continued add Zetia  for better LDL control, A1c stable.    ESRD on HD Patient currently on a Monday, Wednesday, Friday schedule.  - Nephrology consulted on need of hemodialysis.   Hypertension - Continue metoprolol  and furosemide  as tolerated   Hypothyroidism TSH noted to be within normal limits at 1.353 when checked on 11/20/2023 - Continue levothyroxine    Normocytic anemia Chronic and stable monitor.   Right Shin Hematoma Outline, monitor closely with need for heparin  with PE No acute fx/dislocation on tib fib plain films X-ray R Knee, tibia & fibula  unremarkable  History of CVA Hyperlipidemia - Continue aspirin  and atorvastatin    Memory loss - Delirium precautions - Continue Namenda    Anxiety  - Continue current medication regimen  Lives with husband/daughter.  At baseline, before last hospitalization, could transfer, but no extended walking with assistive device.  Wheelchair bound for past year.  Hypoglycemia  Diabetes mellitus type 2 with hypoglycemia due to insulin  Hypoglycemia to 17 on admission Lantus  and sliding scale, dose adjusted monitor closely, may need down titration of her insulin  upon discharge, diabetic and insulin  education.  Lab Results  Component Value Date   HGBA1C 5.7 (H) 11/29/2023   CBG (last 3)  Recent Labs    12/02/23 1632 12/02/23 2113 12/03/23 0814  GLUCAP 177* 146* 154*     DVT prophylaxis:  heparin  gtt Code Status: full Family Communication: Discussed with daughter Marolyn [phone]  11/29/2023, 11/30/2023 Disposition:   Status is: Observation The patient remains OBS appropriate and will d/c  before 2 midnights.   Consultants:  Neurology Nephrology  Procedures:  none  Subjective:  Patient in bed, appears comfortable, denies any headache, no fever, no chest pain or pressure, no shortness of breath , no abdominal pain. No new focal weakness.  Objective: Vitals:   12/02/23 2307 12/03/23 0000 12/03/23 0200 12/03/23 0400  BP: (!) 107/50 (!) 108/52  (!) 109/50  Pulse:  94 96 89  Resp:  15 15 12   Temp: 98 F (36.7 C)   98 F (36.7 C)  TempSrc: Oral   Oral  SpO2:  96% 95% 94%  Weight:      Height:        Intake/Output Summary (Last 24 hours) at 12/03/2023 0856 Last data filed at 12/02/2023 2137 Gross per 24 hour  Intake 3 ml  Output --  Net 3 ml   Filed Weights   11/29/23 1245 12/01/23 0815 12/01/23 1214  Weight: 92 kg 93.2 kg 90.1 kg    Examination:  Awake mildly confused, No new F.N deficits, chronic R. Facial droop [place].AT,PERRAL Supple Neck, No JVD,   Symmetrical Chest wall movement, Good air movement bilaterally, CTAB RRR,No Gallops, Rubs or new Murmurs,  +ve B.Sounds, Abd Soft, No tenderness,   Multiple chronic bruises, stable hematoma overlying the right anterior aspect of the tibia    Data Review:   Inpatient Medications  Scheduled Meds:  apixaban   10 mg Oral BID   Followed by   NOREEN ON 12/05/2023] apixaban   5 mg Oral BID   atorvastatin   80 mg Oral QHS   buPROPion   150 mg Oral BID   carvedilol   12.5 mg Oral Q M,W,F   Chlorhexidine  Gluconate Cloth  6 each Topical Q0600   [START ON 12/09/2023] darbepoetin (ARANESP ) injection - DIALYSIS  60 mcg Subcutaneous Q Sat-1800   doxycycline   100 mg Oral Q12H   ezetimibe   10 mg Oral Daily   furosemide   80 mg Oral Q M,W,F   insulin  aspart  0-9 Units Subcutaneous TID WC   levothyroxine   175 mcg Oral QAC breakfast   memantine   10 mg Oral BID   sodium chloride  flush  3 mL Intravenous Q12H   Continuous Infusions:   PRN Meds:.acetaminophen  **OR** acetaminophen , albuterol   DVT Prophylaxis    Recent  Labs  Lab 11/27/23 0343 11/27/23 0358 11/27/23 0359 11/28/23 0532 11/29/23 0548 11/30/23 0608 12/01/23 0731  WBC 11.4*  --   --  11.4* 7.4 7.3 6.6  HGB 9.6*   < > 8.5* 7.5* 8.4* 9.1* 8.2*  HCT 30.8*   < > 25.0* 24.2* 27.3* 30.1* 27.0*  PLT 266  --   --  253 240 311 284  MCV 99.7  --   --  100.8* 100.0 102.0* 99.3  MCH 31.1  --   --  31.3 30.8 30.8 30.1  MCHC 31.2  --   --  31.0 30.8 30.2 30.4  RDW 15.9*  --   --  15.9* 16.2* 16.1* 15.9*  LYMPHSABS 0.7  --   --   --  2.5  --   --   MONOABS 0.6  --   --   --  0.6  --   --   EOSABS 0.0  --   --   --  0.2  --   --   BASOSABS 0.1  --   --   --  0.0  --   --    < > = values in this interval not displayed.    Recent Labs  Lab 11/27/23 0343 11/27/23 0358 11/27/23 0359 11/27/23 0502 11/27/23 0739 11/28/23 0532 11/29/23 0548 11/29/23 1410 11/30/23 0608 12/01/23 0731  NA 137 137 135  --   --  136 136  --  138 135  K 4.6 4.4 4.4  --   --  4.1 4.2  --  4.5 4.2  CL 98 96*  --   --   --  95* 95*  --  97* 96*  CO2 28  --   --   --   --  27 27  --  27 26  ANIONGAP 11  --   --   --   --  14 14  --  14 13  GLUCOSE 109* 108*  --   --   --  153* 100*  --  131* 154*  BUN 45* 45*  --   --   --  30* 38*  --  27* 46*  CREATININE 5.64* 6.00*  --   --   --  4.59* 6.06*  --  4.54* 5.89*  AST 21  --   --   --   --   --  16  --   --   --   ALT 13  --   --   --   --   --  14  --   --   --   ALKPHOS 54  --   --   --   --   --  43  --   --   --   BILITOT 0.6  --   --   --   --   --  0.6  --   --   --   ALBUMIN 2.2*  --   --   --   --  2.3* 2.1*  --  2.3* 2.3*  DDIMER  --   --   --   --  1.14*  --   --   --   --   --   PROCALCITON 0.49  --   --   --   --   --   --   --   --   --   LATICACIDVEN  --   --  0.7  --   --   --   --   --   --   --   INR 1.1  --   --   --   --   --   --   --   --   --   HGBA1C  --   --   --   --   --   --   --  5.7*  --   --   AMMONIA  --   --   --  22  --   --   --   --   --   --   BNP 36.6  --   --   --   --  50.2  --    --   --   --   MG  --   --   --   --   --   --  2.2  --   --   --   PHOS  --   --   --   --   --  3.6 4.8*  --  4.0 5.0*  CALCIUM  8.6*  --   --   --   --  8.5* 8.4*  --  8.5* 8.6*      Recent Labs  Lab 11/27/23 0343 11/27/23 0359 11/27/23 0502 11/27/23 0739 11/28/23 0532 11/29/23 0548 11/29/23 1410 11/30/23 0608 12/01/23 0731  DDIMER  --   --   --  1.14*  --   --   --   --   --   PROCALCITON 0.49  --   --   --   --   --   --   --   --   LATICACIDVEN  --  0.7  --   --   --   --   --   --   --   INR 1.1  --   --   --   --   --   --   --   --   HGBA1C  --   --   --   --   --   --  5.7*  --   --   AMMONIA  --   --  22  --   --   --   --   --   --   BNP 36.6  --   --   --  50.2  --   --   --   --   MG  --   --   --   --   --  2.2  --   --   --   CALCIUM  8.6*  --   --   --  8.5* 8.4*  --  8.5* 8.6*    --------------------------------------------------------------------------------------------------------------- Lab Results  Component Value Date   CHOL 157 11/29/2023   HDL 67 11/29/2023   LDLCALC 76 11/29/2023   LDLDIRECT 154.2 11/04/2011   TRIG 68 11/29/2023   CHOLHDL 2.3 11/29/2023    Lab Results  Component Value Date   HGBA1C 5.7 (H) 11/29/2023    Radiology Reports  No results found.    Signature  -   Lavada Stank M.D on 12/03/2023 at 8:56 AM   -  To page go to [email or web address]

## 2023-12-03 NOTE — Progress Notes (Signed)
 [facility] Progress Note   Assessment/ Plan:   1 Acute hypoxic RF: CXR negative.  ? Aspiration vs edema-- but would expect to see on CXR.  Regardless- she does look a little volume overloaded. CTA + for PE, on antibiotics too for CAP On Eliquis  for PE 2 ESRD: MWF for now at [facility]- will do 4 hr rx with UF first and then HD.  Will need to use butthonholes.  HD 6/18, 6/20  No heparin  with HD  Next HD 12/04/23 3 Hypertension:  at her normal Bps now 4. Anemia of ESRD:  ESA q Sat 5. Metabolic Bone Disease:  binders/ vits  6.  Dispo admitted  Subjective:    Seen in room.  On BSC today.  For HD tomorrow   Objective:   BP (!) 109/50 (BP Location: Right Arm)   Pulse 89   Temp 98 F (36.7 C) (Oral)   Resp 12   Ht 5' 7 (1.702 m)   Wt 90.1 kg   SpO2 94%   BMI 31.11 kg/m   Physical Exam: GEN nad, lying in bed HEENT EOMI PERRL NECK JVD improved PULM clear anteriorly CV RRR ABD soft EXT 1+ anasarca NEURO AAO x 3 SKIN warm and dry ACCESS:  R AVF + T/b  Labs: BMET Recent Labs  Lab 11/27/23 0343 11/27/23 0358 11/27/23 0359 11/28/23 0532 11/29/23 0548 11/30/23 0608 12/01/23 0731  NA 137 137 135 136 136 138 135  K 4.6 4.4 4.4 4.1 4.2 4.5 4.2  CL 98 96*  --  95* 95* 97* 96*  CO2 28  --   --  27 27 27 26   GLUCOSE 109* 108*  --  153* 100* 131* 154*  BUN 45* 45*  --  30* 38* 27* 46*  CREATININE 5.64* 6.00*  --  4.59* 6.06* 4.54* 5.89*  CALCIUM  8.6*  --   --  8.5* 8.4* 8.5* 8.6*  PHOS  --   --   --  3.6 4.8* 4.0 5.0*   CBC Recent Labs  Lab 11/27/23 0343 11/27/23 0358 11/28/23 0532 11/29/23 0548 11/30/23 0608 12/01/23 0731  WBC 11.4*  --  11.4* 7.4 7.3 6.6  NEUTROABS 10.0*  --   --  3.9  --   --   HGB 9.6*   < > 7.5* 8.4* 9.1* 8.2*  HCT 30.8*   < > 24.2* 27.3* 30.1* 27.0*  MCV 99.7  --  100.8* 100.0 102.0* 99.3  PLT 266  --  253 240 311 284   < > = values in this interval not displayed.      Medications:     apixaban   10 mg Oral BID   Followed by    NOREEN ON 12/05/2023] apixaban   5 mg Oral BID   atorvastatin   80 mg Oral QHS   buPROPion   150 mg Oral BID   carvedilol   12.5 mg Oral Q M,W,F   Chlorhexidine  Gluconate Cloth  6 each Topical Q0600   [START ON 12/09/2023] darbepoetin (ARANESP ) injection - DIALYSIS  60 mcg Subcutaneous Q Sat-1800   doxycycline   100 mg Oral Q12H   ezetimibe   10 mg Oral Daily   furosemide   80 mg Oral Q M,W,F   insulin  aspart  0-9 Units Subcutaneous TID WC   levothyroxine   175 mcg Oral QAC breakfast   memantine   10 mg Oral BID   sodium chloride  flush  3 mL Intravenous Q12H     Almarie Bonine MD 12/03/2023, 10:53 AM

## 2023-12-03 NOTE — Plan of Care (Signed)
  Problem: Education: Goal: Ability to describe self-care measures that may prevent or decrease complications (Diabetes Survival Skills Education) will improve Outcome: Progressing Goal: Individualized Educational Video(s) Outcome: Progressing   Problem: Coping: Goal: Ability to adjust to condition or change in health will improve Outcome: Progressing   Problem: Fluid Volume: Goal: Ability to maintain a balanced intake and output will improve Outcome: Progressing   Problem: Health Behavior/Discharge Planning: Goal: Ability to identify and utilize available resources and services will improve Outcome: Progressing Goal: Ability to manage health-related needs will improve Outcome: Progressing   Problem: Metabolic: Goal: Ability to maintain appropriate glucose levels will improve Outcome: Progressing   Problem: Nutritional: Goal: Maintenance of adequate nutrition will improve Outcome: Progressing Goal: Progress toward achieving an optimal weight will improve Outcome: Progressing   Problem: Skin Integrity: Goal: Risk for impaired skin integrity will decrease Outcome: Progressing   Problem: Education: Goal: Knowledge of General Education information will improve Description: Including pain rating scale, medication(s)/side effects and non-pharmacologic comfort measures Outcome: Progressing   Problem: Health Behavior/Discharge Planning: Goal: Ability to manage health-related needs will improve Outcome: Progressing   Problem: Clinical Measurements: Goal: Ability to maintain clinical measurements within normal limits will improve Outcome: Progressing Goal: Will remain free from infection Outcome: Progressing Goal: Diagnostic test results will improve Outcome: Progressing Goal: Respiratory complications will improve Outcome: Progressing Goal: Cardiovascular complication will be avoided Outcome: Progressing   Problem: Nutrition: Goal: Adequate nutrition will be  maintained Outcome: Progressing   Problem: Coping: Goal: Level of anxiety will decrease Outcome: Progressing   Problem: Elimination: Goal: Will not experience complications related to bowel motility Outcome: Progressing Goal: Will not experience complications related to urinary retention Outcome: Progressing

## 2023-12-04 DIAGNOSIS — I2699 Other pulmonary embolism without acute cor pulmonale: Secondary | ICD-10-CM | POA: Diagnosis not present

## 2023-12-04 LAB — CBC
HCT: 26.3 % — ABNORMAL LOW (ref 36.0–46.0)
Hemoglobin: 8.2 g/dL — ABNORMAL LOW (ref 12.0–15.0)
MCH: 30.9 pg (ref 26.0–34.0)
MCHC: 31.2 g/dL (ref 30.0–36.0)
MCV: 99.2 fL (ref 80.0–100.0)
Platelets: 325 10*3/uL (ref 150–400)
RBC: 2.65 MIL/uL — ABNORMAL LOW (ref 3.87–5.11)
RDW: 16 % — ABNORMAL HIGH (ref 11.5–15.5)
WBC: 8.2 10*3/uL (ref 4.0–10.5)
nRBC: 0 % (ref 0.0–0.2)

## 2023-12-04 LAB — RENAL FUNCTION PANEL
Albumin: 2.4 g/dL — ABNORMAL LOW (ref 3.5–5.0)
Anion gap: 16 — ABNORMAL HIGH (ref 5–15)
BUN: 58 mg/dL — ABNORMAL HIGH (ref 8–23)
CO2: 24 mmol/L (ref 22–32)
Calcium: 8.8 mg/dL — ABNORMAL LOW (ref 8.9–10.3)
Chloride: 97 mmol/L — ABNORMAL LOW (ref 98–111)
Creatinine, Ser: 7.2 mg/dL — ABNORMAL HIGH (ref 0.44–1.00)
GFR, Estimated: 6 mL/min — ABNORMAL LOW (ref >60)
Glucose, Bld: 130 mg/dL — ABNORMAL HIGH (ref 70–99)
Phosphorus: 7.6 mg/dL — ABNORMAL HIGH (ref 2.5–4.6)
Potassium: 4.5 mmol/L (ref 3.5–5.1)
Sodium: 137 mmol/L (ref 135–145)

## 2023-12-04 LAB — GLUCOSE, CAPILLARY
Glucose-Capillary: 120 mg/dL — ABNORMAL HIGH (ref 70–99)
Glucose-Capillary: 130 mg/dL — ABNORMAL HIGH (ref 70–99)

## 2023-12-04 MED ORDER — INSULIN LISPRO PROT & LISPRO (75-25 MIX) 100 UNIT/ML ~~LOC~~ SUSP: 8.0000 [IU] | Freq: Two times a day (BID) | SUBCUTANEOUS | Status: DC

## 2023-12-04 MED ORDER — EZETIMIBE 10 MG PO TABS: 10.0000 mg | ORAL_TABLET | Freq: Every day | ORAL | Status: DC

## 2023-12-04 MED ORDER — INSULIN ASPART 100 UNIT/ML FLEXPEN: PEN_INJECTOR | SUBCUTANEOUS | Status: DC

## 2023-12-04 MED ORDER — APIXABAN 5 MG PO TABS: ORAL_TABLET | ORAL | Status: DC

## 2023-12-04 MED ORDER — INSULIN LISPRO PROT & LISPRO (75-25 MIX) 100 UNIT/ML ~~LOC~~ SUSP: 6.0000 [IU] | Freq: Two times a day (BID) | SUBCUTANEOUS | Status: DC

## 2023-12-04 NOTE — Progress Notes (Addendum)
 Patient completed HD treatment and goal met. Patient said that : I do not want to do dialysis. In her room.  12/04/23 1914  Vitals  Temp 98 F (36.7 C)  Temp Source Oral  BP (!) 106/49  BP Location Left Arm  BP Method Automatic  Patient Position (if appropriate) Lying  Oxygen  Therapy  O2 Device Room Air  During Treatment Monitoring  Intra-Hemodialysis Comments Progressing as prescribed  Post Treatment  Dialyzer Clearance Lightly streaked  Hemodialysis Intake (mL) 0 mL  Liters Processed 74  Fluid Removed (mL) 1900 mL  Tolerated HD Treatment Yes  Post-Hemodialysis Comments Tx done.  AVG/AVF Arterial Site Held (minutes) 7 minutes  AVG/AVF Venous Site Held (minutes) 7 minutes  Fistula / Graft Right Upper arm Arteriovenous fistula  Placement Date/Time: 06/28/22 0818   Placed prior to admission: No  Orientation: Right  Access Location: Upper arm  Access Type: Arteriovenous fistula  Site Condition No complications  Fistula / Graft Assessment Present;Thrill;Bruit  Status Deaccessed  Needle Size 15  Drainage Description None

## 2023-12-04 NOTE — Plan of Care (Signed)
  Problem: Education: Goal: Ability to describe self-care measures that may prevent or decrease complications (Diabetes Survival Skills Education) will improve Outcome: Progressing Goal: Individualized Educational Video(s) Outcome: Progressing   Problem: Coping: Goal: Ability to adjust to condition or change in health will improve Outcome: Progressing   Problem: Fluid Volume: Goal: Ability to maintain a balanced intake and output will improve Outcome: Progressing   Problem: Health Behavior/Discharge Planning: Goal: Ability to identify and utilize available resources and services will improve Outcome: Progressing Goal: Ability to manage health-related needs will improve Outcome: Progressing   Problem: Metabolic: Goal: Ability to maintain appropriate glucose levels will improve Outcome: Progressing   Problem: Nutritional: Goal: Maintenance of adequate nutrition will improve Outcome: Progressing Goal: Progress toward achieving an optimal weight will improve Outcome: Progressing   Problem: Skin Integrity: Goal: Risk for impaired skin integrity will decrease Outcome: Progressing   Problem: Tissue Perfusion: Goal: Adequacy of tissue perfusion will improve Outcome: Progressing   Problem: Education: Goal: Knowledge of General Education information will improve Description: Including pain rating scale, medication(s)/side effects and non-pharmacologic comfort measures Outcome: Progressing   Problem: Health Behavior/Discharge Planning: Goal: Ability to manage health-related needs will improve Outcome: Progressing   Problem: Clinical Measurements: Goal: Ability to maintain clinical measurements within normal limits will improve Outcome: Progressing Goal: Will remain free from infection Outcome: Progressing Goal: Diagnostic test results will improve Outcome: Progressing Goal: Respiratory complications will improve Outcome: Progressing Goal: Cardiovascular complication will  be avoided Outcome: Progressing   Problem: Activity: Goal: Risk for activity intolerance will decrease Outcome: Progressing   Problem: Nutrition: Goal: Adequate nutrition will be maintained Outcome: Progressing   Problem: Coping: Goal: Level of anxiety will decrease Outcome: Progressing   Problem: Elimination: Goal: Will not experience complications related to bowel motility Outcome: Progressing Goal: Will not experience complications related to urinary retention Outcome: Progressing   Problem: Pain Managment: Goal: General experience of comfort will improve and/or be controlled Outcome: Progressing   Problem: Safety: Goal: Ability to remain free from injury will improve Outcome: Progressing   Problem: Skin Integrity: Goal: Risk for impaired skin integrity will decrease Outcome: Progressing   Problem: Education: Goal: Knowledge of disease or condition will improve Outcome: Progressing Goal: Knowledge of secondary prevention will improve (MUST DOCUMENT ALL) Outcome: Progressing Goal: Knowledge of patient specific risk factors will improve (DELETE if not current risk factor) Outcome: Progressing   Problem: Ischemic Stroke/TIA Tissue Perfusion: Goal: Complications of ischemic stroke/TIA will be minimized Outcome: Progressing   Problem: Coping: Goal: Will verbalize positive feelings about self Outcome: Progressing Goal: Will identify appropriate support needs Outcome: Progressing   Problem: Health Behavior/Discharge Planning: Goal: Ability to manage health-related needs will improve Outcome: Progressing Goal: Goals will be collaboratively established with patient/family Outcome: Progressing   Problem: Self-Care: Goal: Ability to participate in self-care as condition permits will improve Outcome: Progressing Goal: Verbalization of feelings and concerns over difficulty with self-care will improve Outcome: Progressing Goal: Ability to communicate needs  accurately will improve Outcome: Progressing   Problem: Nutrition: Goal: Risk of aspiration will decrease Outcome: Progressing Goal: Dietary intake will improve Outcome: Progressing   Problem: Activity: Goal: Ability to tolerate increased activity will improve Outcome: Progressing   Problem: Clinical Measurements: Goal: Ability to maintain a body temperature in the normal range will improve Outcome: Progressing   Problem: Respiratory: Goal: Ability to maintain adequate ventilation will improve Outcome: Progressing Goal: Ability to maintain a clear airway will improve Outcome: Progressing

## 2023-12-04 NOTE — TOC Transition Note (Signed)
 Transition of Care [facility]) - Discharge Note   Patient Details  Name: Jennifer [name] MRN: [id number] Date of Birth: Dec 24, 1955  [facility]) CM/SW Contact:  Inocente GORMAN Kindle, LCSW Phone Number: 12/04/2023, 3:47 PM   Clinical Narrative:    Patient will DC to: [facility] Anticipated DC date: 12/04/23 Family notified: Daughter, Presenter, broadcasting by: ROME at 8pm (after Dialysis)   Per MD patient ready for DC to [facility]. RN to call report prior to discharge [phone] room 223). RN, patient, patient's family, and facility notified of DC. Discharge Summary and FL2 sent to facility. DC packet on chart. Ambulance transport requested for patient.   CSW will sign off for now as social work intervention is no longer needed. Please consult us  again if new needs arise.     Final next level of care: Skilled Nursing Facility Barriers to Discharge: Barriers Resolved   Patient Goals and CMS Choice Patient states their goals for this hospitalization and ongoing recovery are:: Return to snf CMS Medicare.gov Compare Post Acute Care list provided to:: Patient Represenative (must comment) Choice offered to / list presented to : Adult Children [place] ownership interest in [facility].provided to:: Adult Children    Discharge Placement   Existing PASRR number confirmed : 12/04/23          Patient chooses bed at: [facility] Patient to be transferred to facility by: [facility] Name of family member notified: Clotilda Patient and family notified of of transfer: 12/04/23  Discharge Plan and Services Additional resources added to the After Visit Summary for   In-house Referral: Clinical Social Work   Post Acute Care Choice: Skilled Nursing Facility                               Social Drivers of Health (SDOH) Interventions SDOH Screenings   Food Insecurity: No Food Insecurity (11/21/2023)  Housing: Low Risk  (11/21/2023)  Transportation Needs: No  Transportation Needs (11/21/2023)  Utilities: Not At Risk (11/21/2023)  Alcohol  Screen: Low Risk  (0/75/7975)  Depression (PHQ2-9): High Risk (05/30/2023)  Financial Resource Strain: Low Risk  (03/07/2023)  Physical Activity: Inactive (03/07/2023)  Social Connections: Socially Integrated (11/21/2023)  Stress: No Stress Concern Present (03/07/2023)  Tobacco Use: Medium Risk (11/27/2023)  Health Literacy: Adequate Health Literacy (03/07/2023)     Readmission Risk Interventions     No data to display

## 2023-12-04 NOTE — Care Management Important Message (Signed)
 Important Message  Patient Details  Name: Jennifer [name] MRN: [id number] Date of Birth: 1956-04-06   Important Message Given:  Yes - Medicare IM     Jon Cruel 12/04/2023, 2:58 PM

## 2023-12-04 NOTE — Progress Notes (Signed)
Patient off of unit for dialysis.

## 2023-12-04 NOTE — TOC Initial Note (Addendum)
 Transition of Care [facility]) - Initial/Assessment Note    Patient Details  Name: Jennifer [name] MRN: [id number] Date of Birth: 29-Nov-1955  Transition of Care [facility]) CM/SW Contact:    Inocente GORMAN Kindle, LCSW Phone Number: 12/04/2023, 9:23 AM  Clinical Narrative:                 9:23am-Patient admitted from [facility] for rehab. CSW awaiting confirmation from SNF that bed is available for today. CSW provided update to patient's daughter who is requesting [facility] for transport.   9:31 AM-[facility] confirmed bed is available today. Will arrange transport after patient has dialysis. Updated Renal Navigator.   Expected Discharge Plan: Skilled Nursing Facility     Patient Goals and CMS Choice   CMS Medicare.gov Compare Post Acute Care list provided to:: Patient Represenative (must comment) Choice offered to / list presented to : Adult Children [facility] ownership interest in [facility].provided to:: Adult Children    Expected Discharge Plan and Services In-house Referral: Clinical Social Work   Post Acute Care Choice: Skilled Nursing Facility Living arrangements for the past 2 months: Single Family Home Expected Discharge Date: 12/04/23                                    Prior Living Arrangements/Services Living arrangements for the past 2 months: Single Family Home Lives with:: Adult Children Patient language and need for interpreter reviewed:: Yes Do you feel safe going back to the place where you live?: Yes      Need for Family Participation in Patient Care: Yes (Comment) Care giver support system in place?: Yes (comment) Current home services: DME Criminal Activity/Legal Involvement Pertinent to Current Situation/Hospitalization: No - Comment as needed  Activities of Daily Living   ADL Screening (condition at time of admission) Independently performs ADLs?: No Does the patient have a NEW difficulty with bathing/dressing/toileting/self-feeding that is expected to  last >3 days?: Yes (Initiates electronic notice to provider for possible OT consult) Does the patient have a NEW difficulty with getting in/out of bed, walking, or climbing stairs that is expected to last >3 days?: Yes (Initiates electronic notice to provider for possible PT consult) Does the patient have a NEW difficulty with communication that is expected to last >3 days?: Yes (Initiates electronic notice to provider for possible SLP consult) Is the patient deaf or have difficulty hearing?: No Does the patient have difficulty seeing, even when wearing glasses/contacts?: No Does the patient have difficulty concentrating, remembering, or making decisions?: Yes  Permission Sought/Granted Permission sought to share information with : Facility Medical sales representative, Family Supports Permission granted to share information with : No  Share Information with NAME: Clotilda  Permission granted to share info w AGENCY: [facility]  Permission granted to share info w Relationship: Daughter  Permission granted to share info w Contact Information: [phone]  Emotional Assessment Appearance:: Appears stated age Attitude/Demeanor/Rapport: Unable to Assess Affect (typically observed): Unable to Assess Orientation: : Oriented to Self, Oriented to Place, Oriented to Situation Alcohol  / Substance Use: Not Applicable Psych Involvement: No (comment)  Admission diagnosis:  Stupor [R40.1] Pulmonary embolism (HCC) [I26.99] Hypoglycemia [E16.2] Stroke-like symptoms [R29.90] Hypothermia, initial encounter [T68.XXXA] Patient Active Problem List   Diagnosis Date Noted   Pulmonary embolism (HCC) 11/28/2023   Acute respiratory failure with hypoxia (HCC) 11/27/2023   Acute metabolic encephalopathy 11/27/2023   Controlled type 2 diabetes mellitus with hypoglycemia (HCC) 11/27/2023  SIRS (systemic inflammatory response syndrome) (HCC) 11/27/2023   Acute left-sided weakness 11/20/2023   Abnormal urine odor  05/30/2023   Recurrent strokes (HCC) 05/20/2023   Anemia of chronic renal failure 12/19/2022   (HFpEF) heart failure with preserved ejection fraction (HCC) 11/25/2022   Auditory hallucination 07/26/2022   Carotid artery stenosis, asymptomatic, left 05/04/2022   Anemia of chronic disease 03/23/2022   Hyponatremia 03/23/2022   Hyperkalemia 03/23/2022   Dehydration 03/23/2022   Chronic diastolic CHF (congestive heart failure) (HCC) 03/23/2022   Closed left ankle fracture 01/26/2022   Type 2 diabetes mellitus with hyperglycemia (HCC) 01/07/2022   Acute kidney injury superimposed on chronic kidney disease (HCC) 11/19/2021   Frequent urination 07/13/2021   Change in mental status 07/13/2021   Gait abnormality 07/13/2021   Memory loss 07/13/2021   Abnormal urinalysis 07/13/2021   ESRD on hemodialysis (HCC) 07/13/2021   Normocytic anemia 07/13/2021   Diabetic nephropathy (HCC) 05/28/2020   Diabetic retinopathy (HCC) 05/28/2020   Presence of insulin  pump (external) (internal) 05/28/2020   Diabetic macular edema of right eye with proliferative retinopathy associated with type 2 diabetes mellitus (HCC) 04/16/2020   Macular pucker, right eye 04/16/2020   Vitreous hemorrhage of left eye (HCC) 04/16/2020   Pre-op examination 03/13/2020   Chronic venous insufficiency 02/06/2020   Constipation 05/28/2019   S/P TKR (total knee replacement), right 03/23/2019 05/07/2019   Primary osteoarthritis of right knee 04/02/2019   Unilateral primary osteoarthritis, right knee    Pedal edema 02/25/2019   Acute vertigo with vomiting and inability to stand 10/08/2018   Uncontrolled type 2 diabetes mellitus with hyperglycemia, with long-term current use of insulin  (HCC) 10/08/2018   Former smoker 10/08/2018   Carotid artery disease (HCC) 03/15/2018   History of CVA (cerebrovascular accident) 11/16/2017   Low back pain 10/26/2017   Screening mammogram, encounter for 09/18/2017   Coronary artery disease  involving native coronary artery of native heart without angina pectoris 11/04/2015   NSTEMI (non-ST elevated myocardial infarction) (HCC) 11/03/2015   Leukocytosis 08/16/2010   Acquired hypothyroidism 07/06/2010   HLD (hyperlipidemia) 08/28/2008   Essential hypertension 11/13/2007   Proliferative diabetic retinopathy of left eye with macular edema associated with type 2 diabetes mellitus (HCC) 09/21/2006   Diabetic peripheral neuropathy (HCC) 09/21/2006   Obesity (BMI 30-39.9) 09/21/2006   Anxiety 09/21/2006   Adjustment disorder with mixed anxiety and depressed mood 09/21/2006   CARPAL TUNNEL SYNDROME 09/21/2006   COPD (chronic obstructive pulmonary disease) (HCC) 09/21/2006   EDEMA 09/21/2006   MIGRAINES, HX OF 09/21/2006   PCP:  Randeen Laine LABOR, MD Pharmacy:   [facility] [address] [place] - [address] [place] [address] Phone: [phone] Fax: [phone]  [facility] - [place], [place] - [place] [address] [place] [address] Phone: [phone] Fax: [phone]     Social Drivers of Health (SDOH) Social History: SDOH Screenings   Food Insecurity: No Food Insecurity (11/21/2023)  Housing: Low Risk  (11/21/2023)  Transportation Needs: No Transportation Needs (11/21/2023)  Utilities: Not At Risk (11/21/2023)  Alcohol  Screen: Low Risk  (03/07/2023)  Depression (PHQ2-9): High Risk (05/30/2023)  Financial Resource Strain: Low Risk  (03/07/2023)  Physical Activity: Inactive (03/07/2023)  Social Connections: Socially Integrated (11/21/2023)  Stress: No Stress Concern Present (03/07/2023)  Tobacco Use: Medium Risk (11/27/2023)  Health Literacy: Adequate Health Literacy (03/07/2023)   SDOH Interventions:     Readmission Risk Interventions     No data  to display

## 2023-12-04 NOTE — Discharge Planning (Signed)
 [facility] Patient Discharge Orders- [facility] CLINIC: [place]  Patient's name: Jennifer [name] Das Admit/DC Dates: 11/27/2023 - 12/04/23   Discharge Diagnoses: Acute hypoxic respiratory failure 2/2 PNA + Acute PE   Acute pulmonary embolism. On Eliquis  Acute encephalopathy    Recent Labs  Lab 12/01/23 0731  HGB 8.2*  K 4.2  CALCIUM  8.6*  PHOS 5.0*  ALBUMIN 2.3*    Outpatient Dialysis Orders Frequency MWF   Duration: 4H  Dialyzer: 180  BFR: 400 DFR A1.5x Bath: 2K/ 2Ca   EDW: 90 kg  Heparin : -- Access: R AVF 15g needles x 2 (use buttonholes)   Anemia  Aranesp : Given: --   Date of last dose/amount: --   PRBC's Given: -- Date/# of units: -- ESA dose for discharge: Mircera 100 mcg IV q 2 weeks    OTHER/APPTS/LABS Monthly labs per routine    Completed by: Maisie Ronnald Acosta PA-C   D/C Meds to be reconciled by nurse after every discharge.    Reviewed by: MD:______ RN_______

## 2023-12-04 NOTE — Progress Notes (Signed)
 Patient discharged to [facility]. Patient is alert and oriented to self only.  AVS and belongings sent with patient. IV removed. VSS Report called to [facility] [employer] @2052  Daughter notified of discharge

## 2023-12-04 NOTE — Plan of Care (Signed)
  Problem: Education: Goal: Ability to describe self-care measures that may prevent or decrease complications (Diabetes Survival Skills Education) will improve Outcome: Progressing   Problem: Coping: Goal: Ability to adjust to condition or change in health will improve Outcome: Progressing   Problem: Fluid Volume: Goal: Ability to maintain a balanced intake and output will improve Outcome: Progressing   Problem: Metabolic: Goal: Ability to maintain appropriate glucose levels will improve Outcome: Progressing   Problem: Nutritional: Goal: Maintenance of adequate nutrition will improve Outcome: Progressing   Problem: Skin Integrity: Goal: Risk for impaired skin integrity will decrease Outcome: Progressing   

## 2023-12-04 NOTE — Progress Notes (Signed)
 Patient off of unit in dialysis. Report given to Lotus, RN

## 2023-12-04 NOTE — [place] FL2 (Signed)
   MEDICAID FL2 LEVEL OF CARE FORM     IDENTIFICATION  Patient Name: Jennifer [name] Birthdate: 1956/02/14 Sex: female Admission Date (Current Location): 11/27/2023  [facility] and [place] Number:  [employer] and Address:  [facility], [address], [place], [place] [address]      Provider Number: [id number]  Attending Physician Name and Address:  Dennise Lavada POUR, MD  Relative Name and Phone Number:       Current Level of Care: Hospital Recommended Level of Care: Skilled Nursing Facility Prior Approval Number:    Date Approved/Denied:   PASRR Number: [id number]  Discharge Plan: SNF    Current Diagnoses: Patient Active Problem List   Diagnosis Date Noted   Pulmonary embolism (HCC) 11/28/2023   Acute respiratory failure with hypoxia (HCC) 11/27/2023   Acute metabolic encephalopathy 11/27/2023   Controlled type 2 diabetes mellitus with hypoglycemia (HCC) 11/27/2023   SIRS (systemic inflammatory response syndrome) (HCC) 11/27/2023   Acute left-sided weakness 11/20/2023   Abnormal urine odor 05/30/2023   Recurrent strokes (HCC) 05/20/2023   Anemia of chronic renal failure 12/19/2022   (HFpEF) heart failure with preserved ejection fraction (HCC) 11/25/2022   Auditory hallucination 07/26/2022   Carotid artery stenosis, asymptomatic, left 05/04/2022   Anemia of chronic disease 03/23/2022   Hyponatremia 03/23/2022   Hyperkalemia 03/23/2022   Dehydration 03/23/2022   Chronic diastolic CHF (congestive heart failure) (HCC) 03/23/2022   Closed left ankle fracture 01/26/2022   Type 2 diabetes mellitus with hyperglycemia (HCC) 01/07/2022   Acute kidney injury superimposed on chronic kidney disease (HCC) 11/19/2021   Frequent urination 07/13/2021   Change in mental status 07/13/2021   Gait abnormality 07/13/2021   Memory loss 07/13/2021   Abnormal urinalysis 07/13/2021   ESRD on hemodialysis (HCC) 07/13/2021   Normocytic anemia  07/13/2021   Diabetic nephropathy (HCC) 05/28/2020   Diabetic retinopathy (HCC) 05/28/2020   Presence of insulin  pump (external) (internal) 05/28/2020   Diabetic macular edema of right eye with proliferative retinopathy associated with type 2 diabetes mellitus (HCC) 04/16/2020   Macular pucker, right eye 04/16/2020   Vitreous hemorrhage of left eye (HCC) 04/16/2020   Pre-op examination 03/13/2020   Chronic venous insufficiency 02/06/2020   Constipation 05/28/2019   S/P TKR (total knee replacement), right 03/23/2019 05/07/2019   Primary osteoarthritis of right knee 04/02/2019   Unilateral primary osteoarthritis, right knee    Pedal edema 02/25/2019   Acute vertigo with vomiting and inability to stand 10/08/2018   Uncontrolled type 2 diabetes mellitus with hyperglycemia, with long-term current use of insulin  (HCC) 10/08/2018   Former smoker 10/08/2018   Carotid artery disease (HCC) 03/15/2018   History of CVA (cerebrovascular accident) 11/16/2017   Low back pain 10/26/2017   Screening mammogram, encounter for 09/18/2017   Coronary artery disease involving native coronary artery of native heart without angina pectoris 11/04/2015   NSTEMI (non-ST elevated myocardial infarction) (HCC) 11/03/2015   Leukocytosis 08/16/2010   Acquired hypothyroidism 07/06/2010   HLD (hyperlipidemia) 08/28/2008   Essential hypertension 11/13/2007   Proliferative diabetic retinopathy of left eye with macular edema associated with type 2 diabetes mellitus (HCC) 09/21/2006   Diabetic peripheral neuropathy (HCC) 09/21/2006   Obesity (BMI 30-39.9) 09/21/2006   Anxiety 09/21/2006   Adjustment disorder with mixed anxiety and depressed mood 09/21/2006   CARPAL TUNNEL SYNDROME 09/21/2006   COPD (chronic obstructive pulmonary disease) (HCC) 09/21/2006   EDEMA 09/21/2006   MIGRAINES, HX OF 09/21/2006    Orientation  RESPIRATION BLADDER Height & Weight     Situation, Place, Self  Normal Incontinent Weight: 198 lb  10.2 oz (90.1 kg) Height:  5' 7 (170.2 cm)  BEHAVIORAL SYMPTOMS/MOOD NEUROLOGICAL BOWEL NUTRITION STATUS      Continent Diet (See DC Summary)  AMBULATORY STATUS COMMUNICATION OF NEEDS Skin   Extensive Assist Verbally Other (Comment) (Non pressure wound on left toe; pretibial right hematoma)                       Personal Care Assistance Level of Assistance    Bathing Assistance: Maximum assistance Feeding assistance: Limited assistance Dressing Assistance: Maximum assistance     Functional Limitations Info  Sight, Hearing, Speech Sight Info: Impaired Hearing Info: Impaired Speech Info: Adequate    SPECIAL CARE FACTORS FREQUENCY  PT (By licensed PT), OT (By licensed OT)     PT Frequency: 5x/week OT Frequency: 5x/week            Contractures Contractures Info: Not present    Additional Factors Info  Code Status, Allergies Code Status Info: Full Allergies Info: Glucophage; Tnkase            Current Medications (12/04/2023):  This is the current hospital active medication list Current Facility-Administered Medications  Medication Dose Route Frequency Provider Last Rate Last Admin   acetaminophen  (TYLENOL ) tablet 650 mg  650 mg Oral Q6H PRN Smith, Rondell A, MD   650 mg at 11/29/23 2137   Or   acetaminophen  (TYLENOL ) suppository 650 mg  650 mg Rectal Q6H PRN Smith, Rondell A, MD       albuterol  (PROVENTIL ) (2.5 MG/3ML) 0.083% nebulizer solution 2.5 mg  2.5 mg Nebulization Q4H PRN Smith, Rondell A, MD       apixaban  (ELIQUIS ) tablet 10 mg  10 mg Oral BID Pham, Minh Q, RPH-CPP   10 mg at 12/03/23 2137   Followed by   NOREEN ON 12/05/2023] apixaban  (ELIQUIS ) tablet 5 mg  5 mg Oral BID Pham, Minh Q, RPH-CPP       atorvastatin  (LIPITOR ) tablet 80 mg  80 mg Oral QHS Smith, Rondell A, MD   80 mg at 12/03/23 2137   buPROPion  (WELLBUTRIN  SR) 12 hr tablet 150 mg  150 mg Oral BID Smith, Rondell A, MD   150 mg at 12/03/23 1652   carvedilol  (COREG ) tablet 12.5 mg  12.5 mg Oral  Q M,W,F Smith, Rondell A, MD       Chlorhexidine  Gluconate Cloth 2 % PADS 6 each  6 each Topical Q0600 Gearline Norris, MD   6 each at 12/04/23 0530   [START ON 12/09/2023] Darbepoetin Alfa  (ARANESP ) injection 60 mcg  60 mcg Subcutaneous Q Sat-1800 Gearline Norris, MD       doxycycline  (VIBRA -TABS) tablet 100 mg  100 mg Oral Q12H Smith, Rondell A, MD   100 mg at 12/03/23 2136   ezetimibe  (ZETIA ) tablet 10 mg  10 mg Oral Daily Pham, Minh Q, RPH-CPP   10 mg at 12/03/23 9177   furosemide  (LASIX ) tablet 80 mg  80 mg Oral Q M,W,F Smith, Rondell A, MD   80 mg at 11/29/23 1405   insulin  aspart (novoLOG ) injection 0-9 Units  0-9 Units Subcutaneous TID WC Singh, Prashant K, MD   1 Units at 12/03/23 1651   levothyroxine  (SYNTHROID ) tablet 175 mcg  175 mcg Oral QAC breakfast Claudene Reeves A, MD   175 mcg at 12/04/23 0530   memantine  (NAMENDA ) tablet 10 mg  10 mg Oral  BID Claudene Reeves A, MD   10 mg at 12/03/23 2137   sodium chloride  flush (NS) 0.9 % injection 3 mL  3 mL Intravenous Q12H Claudene Reeves A, MD   3 mL at 12/03/23 2137     Discharge Medications: Please see discharge summary for a list of discharge medications.  Relevant Imaging Results:  Relevant Lab Results:   Additional Information SSN: [id number]  Jeoffrey LITTIE Moose, LCSW

## 2023-12-04 NOTE — Progress Notes (Signed)
 Contacted by CSW regarding pt's d/c to snf today. Contacted [facility] to confirm pt can start on Wednesday. Pt can start on Wednesday and will need to arrive at 11:00 am for paperwork prior to treatment at 12:00 (MWF schedule). HD arrangements placed on pt's AVS and provided to CSW to provide to snf. Also requested that CSW remind snf to send a hoyer pad with pt to HD appts. Contacted renal PA regarding clinic's need for orders. Clinic aware pt should d/c today and start on Wed.   Randine Mungo Renal Navigator [phone]

## 2023-12-04 NOTE — Progress Notes (Signed)
 [facility] Progress Note   Assessment/ Plan:   1 Acute hypoxic RF: CXR negative.  ? Aspiration vs edema-- but would expect to see on CXR.  Regardless- she does look a little volume overloaded. CTA + for PE, on antibiotics too for CAP On Eliquis  for PE 2 ESRD: MWF for now at [facility]- will do 4 hr rx with UF first and then HD.  Will need to use [facility].  HD 6/18, 6/20  No heparin  with HD  Next HD 12/04/23 3 Hypertension:  at her normal Bps now 4. Anemia of ESRD:  ESA q Sat 5. Metabolic Bone Disease:  binders/ vits  6.  Dispo admitted  Subjective:    Seen in room.  On room air. Trying to get out of bed. Dialysis today.    Objective:   BP (!) 126/51 (BP Location: Left Arm)   Pulse 88   Temp 98 F (36.7 C) (Oral)   Resp 13   Ht 5' 7 (1.702 m)   Wt 90.1 kg   SpO2 96%   BMI 31.11 kg/m   Physical Exam: GEN nad, lying in bed HEENT EOMI PERRL NECK JVD improved PULM clear anteriorly CV RRR ABD soft EXT 1+ anasarca NEURO AAO x 3 SKIN warm and dry ACCESS:  R AVF + T/b  Labs: BMET Recent Labs  Lab 11/28/23 0532 11/29/23 0548 11/30/23 0608 12/01/23 0731  NA 136 136 138 135  K 4.1 4.2 4.5 4.2  CL 95* 95* 97* 96*  CO2 27 27 27 26   GLUCOSE 153* 100* 131* 154*  BUN 30* 38* 27* 46*  CREATININE 4.59* 6.06* 4.54* 5.89*  CALCIUM  8.5* 8.4* 8.5* 8.6*  PHOS 3.6 4.8* 4.0 5.0*   CBC Recent Labs  Lab 11/28/23 0532 11/29/23 0548 11/30/23 0608 12/01/23 0731  WBC 11.4* 7.4 7.3 6.6  NEUTROABS  --  3.9  --   --   HGB 7.5* 8.4* 9.1* 8.2*  HCT 24.2* 27.3* 30.1* 27.0*  MCV 100.8* 100.0 102.0* 99.3  PLT 253 240 311 284      Medications:     apixaban   10 mg Oral BID   Followed by   NOREEN ON 12/05/2023] apixaban   5 mg Oral BID   atorvastatin   80 mg Oral QHS   buPROPion   150 mg Oral BID   carvedilol   12.5 mg Oral Q M,W,F   Chlorhexidine  Gluconate Cloth  6 each Topical Q0600   [START ON 12/09/2023] darbepoetin (ARANESP ) injection - DIALYSIS  60 mcg Subcutaneous  Q Sat-1800   doxycycline   100 mg Oral Q12H   ezetimibe   10 mg Oral Daily   furosemide   80 mg Oral Q M,W,F   insulin  aspart  0-9 Units Subcutaneous TID WC   levothyroxine   175 mcg Oral QAC breakfast   memantine   10 mg Oral BID   sodium chloride  flush  3 mL Intravenous Q12H     Maisie Ronnald Acosta PA-C [facility] 12/04/2023,11:15 AM

## 2023-12-04 NOTE — Discharge Summary (Signed)
 Jennifer [name] FMW:[id number] DOB: 1955/06/27 DOA: 11/27/2023  PCP: Randeen Laine LABOR, MD  Admit date: 11/27/2023  Discharge date: 12/04/2023  Admitted From: Home   Disposition:  SNF   Recommendations for Outpatient Follow-up:   Follow up with PCP in 1-2 weeks  PCP Please obtain BMP/CBC, 2 view CXR in 1week,  (see Discharge instructions)   PCP Please follow up on the following pending results:    Home Health: None   Equipment/Devices: None  Consultations: Neuro Discharge Condition: Stable    CODE STATUS: Full    Diet Recommendation: Heart Healthy Low Carb, check CBGs q. [facility] S.    Chief Complaint  Patient presents with   Respiratory Distress     Brief history of present illness from the day of admission and additional interim summary    68 y.o. female with medical history significant of hypertension, CAD, CVA, diabetes mellitus type 2, ESRD on HD, hypothyroidism, anxiety, depression, and memory loss  altered mental status and hypoglycemia.    Records note patient had just recently been hospitalized 6/9 - 6/12 for acute left-sided weakness without clear signs of CVA on MRI or seizure by EEG.  During hospital stay patient was noted to have E. coli and Proteus in urine cultures and started on Rocephin  6/12 completing course prior to discharge.  During her stay she was also transition to a Monday, Wednesday, Friday schedule for hemodialysis.    She was brought to the hospital from [facility] rehab facility after last night, she was noted to be altered and unresponsive with a low blood sugar level in the fifties. Her oxygen  saturation was also low, in the 60s, and she was given a Duoneb treatment, which only increased her oxygen  saturation to 80-81. She was lethargic and not responding well, leading to a call to emergency  services.                                                                  Hospital Course   Acute respiratory failure with hypoxia to combination of PE and community-acquired pneumonia. Was on heparin  drip for PE, being switched to Eliquis  on 11/30/2023, empiric antibiotics for community-acquired pneumonia, no clinical signs of sepsis.  Although likely was septic at the time of admission with hypothermia and hypoglycemia, all sepsis pathophysiology has resolved.  Lower extremity venous duplex unremarkable, echo stable.  She has finished her antibiotic course and now stable on room air on oral Eliquis .   Acute Pulmonary Embolism with Possible Pulmonary Infarct As above   Community Acquired Pneumonia As above.  Follow cultures   Acute metabolic encephalopathy History of Dementia with small punctate lacunar infarct. Baseline dementia, mild intermittent confusion in the hospital, MRI brain with possible punctate acute lacunar infarct - will discuss with neurology, discussed with Dr. Rosemarie  on 11/29/2023. Full stroke workup, continue Eliquis , on high-dose Lipitor  which will be continued added Zetia  for better LDL control, A1c stable.     ESRD on HD Patient currently on a Monday, Wednesday, Friday schedule.  - Nephrology consulted on need of hemodialysis.   Hypertension - Continue metoprolol  and furosemide  as tolerated   Hypothyroidism TSH noted to be within normal limits at 1.353 when checked on 11/20/2023 - Continue levothyroxine    Normocytic anemia Chronic and stable monitor.   Right Shin Hematoma Outline, monitor closely with need for heparin  with PE No acute fx/dislocation on tib fib plain films X-ray R Knee, tibia & fibula  unremarkable   History of CVA Hyperlipidemia - Continue aspirin  and atorvastatin    Memory loss - Delirium precautions - Continue Namenda    Anxiety  - Continue current medication regimen   Lives with husband/daughter.  At baseline, before last  hospitalization, could transfer, but no extended walking with assistive device.  Wheelchair bound for past year.   Hypoglycemia  Diabetes mellitus type 2 with hypoglycemia due to insulin  On high-dose 70/30 twice daily on admission, reduced to 6 units twice daily along with low-dose sliding scale, check CBGs q. [facility]   Discharge diagnosis     Principal Problem:   Pulmonary embolism (HCC) Active Problems:   Acute respiratory failure with hypoxia (HCC)   ESRD on hemodialysis (HCC)   Acute metabolic encephalopathy   Controlled type 2 diabetes mellitus with hypoglycemia (HCC)   SIRS (systemic inflammatory response syndrome) (HCC)   Essential hypertension   Acquired hypothyroidism   Normocytic anemia   HLD (hyperlipidemia)   History of CVA (cerebrovascular accident)   Memory loss   Anxiety    Discharge instructions    Discharge Instructions     Discharge instructions   Complete by: As directed    Follow with Primary MD [name], Laine LABOR, MD in 7 days   Get CBC, CMP, 2 view Chest X ray -  checked next visit with your primary MD or SNF MD   Activity: As tolerated with Full fall precautions use walker/cane & assistance as needed  Disposition SNF  Diet: Heart Healthy Low Carb, check CBGs q. [facility] S  Special Instructions: If you have smoked or chewed Tobacco  in the last 2 yrs please stop smoking, stop any regular Alcohol   and or any Recreational drug use.  On your next visit with your primary care physician please Get Medicines reviewed and adjusted.  Please request your Prim.MD to go over all Hospital Tests and Procedure/Radiological results at the follow up, please get all Hospital records sent to your Prim MD by signing hospital release before you go home.  If you experience worsening of your admission symptoms, develop shortness of breath, life threatening emergency, suicidal or homicidal thoughts you must seek medical attention immediately by calling 911 or calling your MD  immediately  if symptoms less severe.  You Must read complete instructions/literature along with all the possible adverse reactions/side effects for all the Medicines you take and that have been prescribed to you. Take any new Medicines after you have completely understood and accpet all the possible adverse reactions/side effects.   Do not drive when taking Pain medications.  Do not take more than prescribed Pain, Sleep and Anxiety Medications  Wear Seat belts while driving.   Increase activity slowly   Complete by: As directed    No wound care   Complete by: As directed        Discharge  Medications   Allergies as of 12/04/2023       Reactions   Glucophage [metformin] Diarrhea   Tnkase  [tenecteplase ] Other (See Comments)   Unknown reaction        Medication List     STOP taking these medications    doxycycline  100 MG capsule Commonly known as: VIBRAMYCIN    IRON SUCROSE IV       TAKE these medications    apixaban  5 MG Tabs tablet Commonly known as: ELIQUIS  Take 2 tablets (10 mg total) by mouth 2 (two) times daily for 1 day, THEN 1 tablet (5 mg total) 2 (two) times daily. Start taking on: December 04, 2023   aspirin  81 MG chewable tablet Chew 1 tablet (81 mg total) by mouth daily.   atorvastatin  80 MG tablet Commonly known as: LIPITOR  TAKE 1 TABLET BY MOUTH ONCE DAILY AT  6  IN  THE  EVENING   bisacodyl  10 MG suppository Commonly known as: DULCOLAX Place 10 mg rectally daily as needed (no relief from Milk of Magnesia).   buPROPion  150 MG 12 hr tablet Commonly known as: WELLBUTRIN  SR Take 1 tablet by mouth twice daily   carvedilol  12.5 MG tablet Commonly known as: COREG  Take 1 tablet (12.5 mg total) by mouth daily. On non-dialysis days What changed:  when to take this additional instructions   DULoxetine  60 MG capsule Commonly known as: CYMBALTA  Take 1 capsule by mouth once daily   Enema Enem Place 1 enema rectally daily as needed (no relief from  bisacodyl  suppository).   ezetimibe  10 MG tablet Commonly known as: ZETIA  Take 1 tablet (10 mg total) by mouth daily.   feeding supplement (NEPRO CARB STEADY) Liqd Take 237 mLs by mouth as needed (missed meal during dialysis.). What changed:  when to take this additional instructions   ferrous sulfate  325 (65 FE) MG tablet Take 325 mg by mouth daily as needed (low iron at dialysis).   furosemide  80 MG tablet Commonly known as: LASIX  Take 80 mg by mouth every Monday, Wednesday, and Friday. Give on dialysis days : Monday, Wednesday, Friday   insulin  aspart 100 UNIT/ML FlexPen Commonly known as: NOVOLOG  Before each meal 3 times a day, 140-199 - 2 units, 200-250 - 4 units, 251-299 - 6 units,  300-349 - 8 units,  350 or above 10 units.   insulin  lispro protamine-lispro (75-25) 100 UNIT/ML Susp injection Commonly known as: HUMALOG  75/25 MIX Inject 0.06 mLs (6 Units total) into the skin 2 (two) times daily with a meal. Inject 25 units with breakfast, 10 units with lunch, 25 units with supper What changed:  how much to take when to take this   iron sucrose 100 mg in sodium chloride  0.9 % 100 mL Inject 100 mg into the vein as needed (iron-deficiency anemia). Venofer   levothyroxine  175 MCG tablet Commonly known as: SYNTHROID  Take 175 mcg by mouth daily before breakfast.   lidocaine -prilocaine  cream Commonly known as: EMLA  Apply 1 Application topically every Monday, Wednesday, and Friday. Apply on dialysis days : Monday, Wednesday, Friday   memantine  10 MG tablet Commonly known as: Namenda  Take 1 tablet (10 mg total) by mouth 2 (two) times daily.   MILK OF MAGNESIA PO Take 30 mLs by mouth daily as needed (no BM in 3 days).   Mircera 200 MCG/0.3ML Sosy Generic drug: Methoxy PEG-Epoetin Beta 200 mcg by Implant route daily as needed (low RBC at dialysis). Mircera   nitroGLYCERIN  0.4 MG SL tablet Commonly known as:  NITROSTAT  Place 0.4 mg under the tongue every 5 (five) minutes  x 3 doses as needed for chest pain.   sevelamer  carbonate 0.8 g Pack packet Commonly known as: RENVELA  Take 0.8 g by mouth 3 (three) times daily with meals.   tobramycin  0.3 % ophthalmic solution Commonly known as: TOBREX  Place 1 drop into the right eye every 2 (two) hours.         Follow-up Information     [name], Laine LABOR, MD. Schedule an appointment as soon as possible for a visit in 1 week(s).   Specialties: Family Medicine, Radiology Contact information: [address] [place] [address] [phone]         [facility]. Schedule an appointment as soon as possible for a visit in 1 week(s).   Contact information: [address]     [address] [place]  [address] [phone]                Major procedures and Radiology Reports - PLEASE review detailed and final reports thoroughly  -      VAS US  CAROTID (at [facility] and [facility] only) Result Date: 11/30/2023 Carotid Arterial Duplex Study Patient Name:  JHANAE JASKOWIAK  Date of Exam:   11/29/2023 Medical Rec #: [id number]      Accession #:    [id number] Date of Birth: 1956-05-16     Patient Gender: F Patient Age:   62 years Exam Location:  [facility] Procedure:      VAS US  CAROTID Referring Phys: EARLE DE LA TORRE --------------------------------------------------------------------------------  Indications:       CVA. Risk Factors:      Hypertension, hyperlipidemia, Diabetes, past history of                    smoking, prior CVA. Other Factors:     CKD, RUE dialysis access. Comparison Study:  06/12/2023- no hemodynamically significant stenosis by duplex                    criteria in the extracranial cerebrovascular circulation. Performing Technologist: Elmarie Lindau, RVT  Examination Guidelines: A complete evaluation includes B-mode imaging, spectral Doppler, color Doppler, and power Doppler as needed of all accessible portions of each vessel. Bilateral testing is considered an  integral part of a complete examination. Limited examinations for reoccurring indications may be performed as noted.  Right Carotid Findings: +----------+--------+--------+--------+------------------+------------------+           PSV cm/sEDV cm/sStenosisPlaque DescriptionComments           +----------+--------+--------+--------+------------------+------------------+ CCA Prox  96      0                                                    +----------+--------+--------+--------+------------------+------------------+ CCA Distal62      9                                                    +----------+--------+--------+--------+------------------+------------------+ ICA Prox  59      11      1-39%  intimal thickening +----------+--------+--------+--------+------------------+------------------+ ICA Mid   79      15                                                   +----------+--------+--------+--------+------------------+------------------+ ICA Distal71      16                                                   +----------+--------+--------+--------+------------------+------------------+ ECA       103                                                          +----------+--------+--------+--------+------------------+------------------+ +----------+--------+-------+---------------+-------------------+           PSV cm/sEDV cmsDescribe       Arm Pressure (mmHG) +----------+--------+-------+---------------+-------------------+ [id number]            dialysis access                    +----------+--------+-------+---------------+-------------------+ +---------+--------+--+--------+--+---------+ VertebralPSV cm/s54EDV cm/s10Antegrade +---------+--------+--+--------+--+---------+  Left Carotid Findings: +----------+--------+--------+--------+------------------+------------------+           PSV cm/sEDV cm/sStenosisPlaque DescriptionComments            +----------+--------+--------+--------+------------------+------------------+ CCA Prox  111     0                                                    +----------+--------+--------+--------+------------------+------------------+ CCA Distal86      13                                intimal thickening +----------+--------+--------+--------+------------------+------------------+ ICA Prox  76      20                                                   +----------+--------+--------+--------+------------------+------------------+ ICA Distal84      18                                                   +----------+--------+--------+--------+------------------+------------------+ ECA       70                                                           +----------+--------+--------+--------+------------------+------------------+ +----------+--------+--------+--------+-------------------+           PSV cm/sEDV cm/sDescribeArm Pressure (mmHG) +----------+--------+--------+--------+-------------------+ [id number]  Stenotic                    +----------+--------+--------+--------+-------------------+ +---------+--------+--+--------+--+---------+ VertebralPSV cm/s70EDV cm/s14Antegrade +---------+--------+--+--------+--+---------+   Summary: Right Carotid: Velocities in the right ICA are consistent with a 1-39% stenosis. Left Carotid: The extracranial vessels were near-normal with only minimal wall               thickening or plaque. Vertebrals:  Bilateral vertebral arteries demonstrate antegrade flow. Subclavians: Left subclavian artery was stenotic. Monophasic low-resistive flow              in right subclavian artery due to dialysis access. *See table(s) above for measurements and observations.  Electronically signed by Eather Popp MD on 11/30/2023 at 10:37:22 AM.    Final    VAS US  LOWER EXTREMITY VENOUS (DVT) Result Date: 11/29/2023  Lower Venous DVT  Study Patient Name:  JEANEE FABRE  Date of Exam:   11/29/2023 Medical Rec #: [id number]      Accession #:    [id number] Date of Birth: [birth year]     Patient Gender: F Patient Age:   45 years Exam Location:  [facility] Procedure:      VAS US  LOWER EXTREMITY VENOUS (DVT) Referring Phys: A POWELL JR --------------------------------------------------------------------------------  Indications: Pulmonary embolism.  Comparison Study: 05/21/2023 - negative Performing Technologist: Elmarie Lindau, RVT  Examination Guidelines: A complete evaluation includes B-mode imaging, spectral Doppler, color Doppler, and power Doppler as needed of all accessible portions of each vessel. Bilateral testing is considered an integral part of a complete examination. Limited examinations for reoccurring indications may be performed as noted. The reflux portion of the exam is performed with the patient in reverse Trendelenburg.  +---------+---------------+---------+-----------+----------+--------------+ RIGHT    CompressibilityPhasicitySpontaneityPropertiesThrombus Aging +---------+---------------+---------+-----------+----------+--------------+ CFV      Full           Yes      Yes                                 +---------+---------------+---------+-----------+----------+--------------+ SFJ      Full                                                        +---------+---------------+---------+-----------+----------+--------------+ FV Prox  Full                                                        +---------+---------------+---------+-----------+----------+--------------+ FV Mid   Full                                                        +---------+---------------+---------+-----------+----------+--------------+ FV DistalFull                                                        +---------+---------------+---------+-----------+----------+--------------+  PFV      Full                                                         +---------+---------------+---------+-----------+----------+--------------+ POP      Full           Yes      Yes                                 +---------+---------------+---------+-----------+----------+--------------+ PTV      Full                                                        +---------+---------------+---------+-----------+----------+--------------+ PERO     Full                                                        +---------+---------------+---------+-----------+----------+--------------+   +---------+---------------+---------+-----------+----------+--------------+ LEFT     CompressibilityPhasicitySpontaneityPropertiesThrombus Aging +---------+---------------+---------+-----------+----------+--------------+ CFV      Full           Yes      Yes                                 +---------+---------------+---------+-----------+----------+--------------+ SFJ      Full                                                        +---------+---------------+---------+-----------+----------+--------------+ FV Prox  Full                                                        +---------+---------------+---------+-----------+----------+--------------+ FV Mid   Full                                                        +---------+---------------+---------+-----------+----------+--------------+ FV DistalFull                                                        +---------+---------------+---------+-----------+----------+--------------+ PFV      Full                                                        +---------+---------------+---------+-----------+----------+--------------+  POP      Full           Yes      Yes                                 +---------+---------------+---------+-----------+----------+--------------+ PTV      Full                                                         +---------+---------------+---------+-----------+----------+--------------+ PERO                                                  not well seen  +---------+---------------+---------+-----------+----------+--------------+ The left peroneal veins are not seen well.    Summary: BILATERAL: - No evidence of deep vein thrombosis seen in the lower extremities, bilaterally. -No evidence of popliteal cyst, bilaterally.  LEFT: - There is no evidence of deep vein thrombosis in the lower extremity. However, portions of this examination were limited- see technologist comments above.  *See table(s) above for measurements and observations. Electronically signed by Fonda Rim on 11/29/2023 at 7:05:40 PM.    Final    DG Chest Port 1 View Result Date: 11/29/2023 CLINICAL DATA:  68 year old female with shortness of breath. Left lower lobe pulmonary emboli detected yesterday. EXAM: PORTABLE CHEST 1 VIEW COMPARISON:  CTA chest yesterday. FINDINGS: Portable AP semi upright view at 0633 hours. Lung volumes are stable and within normal limits. Stable cardiac size and mediastinal contours. Calcified aortic atherosclerosis. Streaky and confluent left lower lobe opacity better demonstrated by CTA yesterday. Lung ventilation appears stable from 11/27/2023 radiographs. Visualized tracheal air column is within normal limits. No pneumothorax, edema. No definite pleural effusion. Paucity of bowel gas. Stable cholecystectomy clips. Stable visualized osseous structures. IMPRESSION: Stable ventilation. Left lower lobe pulmonary emboli and airspace opacity better demonstrated by CTA yesterday. No new cardiopulmonary abnormality. Electronically Signed   By: VEAR Hurst M.D.   On: 11/29/2023 06:59   MR ANGIO HEAD WO CONTRAST Result Date: 11/28/2023 CLINICAL DATA:  Follow-up examination for stroke. EXAM: MRA HEAD WITHOUT CONTRAST TECHNIQUE: Angiographic images of the Circle of Willis were acquired using MRA technique without intravenous  contrast. COMPARISON:  Prior brain MRI from 11/27/2023 FINDINGS: Anterior circulation: Both internal carotid arteries are patent through the siphons without hemodynamically significant stenosis. No made of a tiny 1-2 mm outpouching arising from the supraclinoid left ICA (series 5, image 93). A small vessel is seen coursing from the apex of this outpouching, consistent with a small vascular infundibulum. A1 segments patent bilaterally. Normal anterior communicating artery complex. Anterior cerebral arteries patent without significant stenosis. No M1 stenosis or occlusion. Distal MCA branches perfused and symmetric. Posterior circulation: Vertebral arteries are largely codominant and patent without stenosis. Both PICA patent. Basilar patent without stenosis. Superior cerebral arteries patent bilaterally. Both PCAs primarily supplied via the basilar. Moderate bilateral P2 stenoses noted (series 1037, image 14). PCAs otherwise patent to their distal aspects. Anatomic variants: None significant. Other: No intracranial aneurysm. IMPRESSION: 1. Negative intracranial MRA for large vessel occlusion. 2. Moderate bilateral P2 stenoses. 3. Otherwise wide patency of the major intracranial arterial  circulation. No other hemodynamically significant or correctable stenosis. Electronically Signed   By: Morene Hoard M.D.   On: 11/28/2023 16:47   ECHOCARDIOGRAM LIMITED Result Date: 11/28/2023    ECHOCARDIOGRAM LIMITED REPORT   Patient Name:   AISHWARYA SHIPLETT Date of Exam: 11/28/2023 Medical Rec #:  [id number]     Height:       67.0 in Accession #:    [id number]    Weight:       201.1 lb Date of Birth:  July 28, 1955    BSA:          2.027 m Patient Age:    67 years      BP:           134/49 mmHg Patient Gender: F             HR:           65 bpm. Exam Location:  Inpatient Procedure: Limited Echo (Both Spectral and Color Flow Doppler were utilized            during procedure). Indications:    Pulmonary Embolus I26.09  History:         Patient has prior history of Echocardiogram examinations, most                 recent 11/21/2023.  Sonographer:    Tinnie Gosling RDCS Referring Phys: [phone] A CALDWELL POWELL JR IMPRESSIONS  1. Left ventricular ejection fraction, by estimation, is 60 to 65%. The left ventricle has normal function. The left ventricle has no regional wall motion abnormalities.  2. Right ventricular systolic function is normal. The right ventricular size is normal.  3. The mitral valve is normal in structure. No evidence of mitral valve regurgitation. No evidence of mitral stenosis.  4. The aortic valve is normal in structure. There is mild calcification of the aortic valve. Aortic valve regurgitation is not visualized. No aortic stenosis is present.  5. The inferior vena cava is normal in size with greater than 50% respiratory variability, suggesting right atrial pressure of 3 mmHg. FINDINGS  Left Ventricle: Left ventricular ejection fraction, by estimation, is 60 to 65%. The left ventricle has normal function. The left ventricle has no regional wall motion abnormalities. The left ventricular internal cavity size was normal in size. There is  no left ventricular hypertrophy. Right Ventricle: The right ventricular size is normal. No increase in right ventricular wall thickness. Right ventricular systolic function is normal. Left Atrium: Left atrial size was normal in size. Right Atrium: Right atrial size was normal in size. Pericardium: There is no evidence of pericardial effusion. Mitral Valve: The mitral valve is normal in structure. No evidence of mitral valve stenosis. Tricuspid Valve: The tricuspid valve is normal in structure. Tricuspid valve regurgitation is not demonstrated. No evidence of tricuspid stenosis. Aortic Valve: The aortic valve is normal in structure. There is mild calcification of the aortic valve. Aortic valve regurgitation is not visualized. No aortic stenosis is present. Pulmonic Valve: The pulmonic valve was  not assessed. Pulmonic valve regurgitation is not visualized. No evidence of pulmonic stenosis. Aorta: The aortic root is normal in size and structure. Venous: The inferior vena cava is normal in size with greater than 50% respiratory variability, suggesting right atrial pressure of 3 mmHg. IAS/Shunts: No atrial level shunt detected by color flow Doppler. LEFT VENTRICLE PLAX 2D LVIDd:         5.00 cm LVIDs:         4.00 cm  LV PW:         0.90 cm LV IVS:        1.00 cm  Aditya Sabharwal Electronically signed by Ria Commander Signature Date/Time: 11/28/2023/3:21:14 PM    Final    CT Angio Chest Pulmonary Embolism (PE) W or WO Contrast Addendum Date: 11/28/2023 ADDENDUM REPORT: 11/28/2023 07:13 ADDENDUM: Study discussed by telephone with Dr. Franky on 11/28/2023 at 0644 hours. Electronically Signed   By: VEAR Hurst M.D.   On: 11/28/2023 07:13   Result Date: 11/28/2023 CLINICAL DATA:  68 year old female with respiratory distress. EXAM: CT ANGIOGRAPHY CHEST WITH CONTRAST TECHNIQUE: Multidetector CT imaging of the chest was performed using the standard protocol during bolus administration of intravenous contrast. Multiplanar CT image reconstructions and MIPs were obtained to evaluate the vascular anatomy. RADIATION DOSE REDUCTION: This exam was performed according to the departmental dose-optimization program which includes automated exposure control, adjustment of the mA and/or kV according to patient size and/or use of iterative reconstruction technique. CONTRAST:  75mL OMNIPAQUE  IOHEXOL  350 MG/ML SOLN COMPARISON:  Portable chest yesterday. CT Abdomen and Pelvis 03/22/2022. FINDINGS: Cardiovascular: Adequate contrast bolus timing in the pulmonary arterial tree. Mild respiratory motion. There is low-density pulmonary artery embolus in the left lung, lingula lobar branch series 7, image 188. But no other lobar pulmonary embolus in the left lung. Basal segmental pulmonary artery detail is limited by respiratory  motion but there does appear to be posterior basal segmental pulmonary embolus on series 7, image 278, and regional abnormal lung parenchyma there (see below). No central or saddle embolus. No contralateral right lung pulmonary artery filling defect is identified. Calcified aortic atherosclerosis. Calcified coronary artery atherosclerosis. Heart size within normal limits, stable, no pericardial effusion. RV LV ratio appears to remain within normal limits. Mediastinum/Nodes: Negative for mediastinal mass or lymphadenopathy. Lungs/Pleura: Major airways are patent. There is abnormal left lower lobe opacity which is both scattered, nodular, peribronchial, and also confluent in the posterior basal segment on series 6, image 102. No pleural effusion superimposed. No lingula pulmonary infarct or confluent opacity. Contralateral streaky right costophrenic angle opacity most resembles atelectasis, and mild scarring was also present there on the 2023 comparison. No right pleural effusion. Upper Abdomen: Negative visible early contrasted appearance of the liver, spleen, pancreas, adrenal glands, left kidney, and bowel in the upper abdomen. Musculoskeletal: Flowing thoracic endplate osteophytes resulting in some levels of interbody ankylosis. No acute or suspicious osseous lesion identified. Review of the MIP images confirms the above findings. IMPRESSION: 1. Positive for Left lung Lingula lobar and Lower lobe basal segmental branch Pulmonary emboli. No convincing CTA evidence of right heart strain. 2. No pleural effusion. No lingula infarct. Scattered, patchy, and confluent left lower lobe opacity which may be a combination of pulmonary infarct and infection/Bronchopneumonia. 3. Calcified coronary artery and Aortic Atherosclerosis (ICD10-I70.0). Electronically Signed: By: VEAR Hurst M.D. On: 11/28/2023 06:33   DG Tibia/Fibula Right Result Date: 11/27/2023 CLINICAL DATA:  Right knee and lower leg pain. EXAM: RIGHT TIBIA AND  FIBULA - 2 VIEW COMPARISON:  11/27/2023. FINDINGS: There is no evidence of acute fracture or dislocation. Total knee arthroplasty changes are noted without evidence of hardware loosening. Vascular calcifications are noted in the soft tissues. Soft tissue thickening is noted anterior to the mid tibial shaft. IMPRESSION: No acute fracture or dislocation. Electronically Signed   By: Leita Birmingham M.D.   On: 11/27/2023 20:46   MR BRAIN WO CONTRAST Result Date: 11/27/2023 CLINICAL DATA:  Mental status change, unknown  cause Neuro deficit, acute, stroke suspected EXAM: MRI HEAD WITHOUT CONTRAST TECHNIQUE: Multiplanar, multiecho pulse sequences of the brain and surrounding structures were obtained without intravenous contrast. COMPARISON:  CT of the head dated November 27, 2023. FINDINGS: Brain: There is a punctate focus of high diffusion signal present along the medial surface of the anterior horn of the left lateral ventricle seen on image 27 of series 2. The diffusion-weighted images are otherwise unremarkable. There is global cerebral volume loss, which is advanced for the patient's age. There is also extensive diffuse cerebral white matter disease. Chronic encephalomalacia changes are noted within the left cerebellar hemisphere. There is no evidence of acute hemorrhage, mass or hydrocephalus. There are numerous blooming artifacts present within the brainstem, periventricular white matter, mesial temporal lobes and the cerebellum. Vascular: Normal flow voids. Skull and upper cervical spine: Normal bone marrow signal. Sinuses/Orbits: Status post bilateral lens replacement. The paranasal sinuses are clear. Other: None. IMPRESSION: 1. Questionable punctate acute lacunar infarct involving the left periventricular white matter. 2. Extensive cerebral white matter disease and chronic encephalomalacia changes within the left cerebellar hemisphere. 3. Numerous foci of hemosiderin staining primarily within the brainstem,  periventricular white matter and mesial temporal lobes. Electronically Signed   By: Evalene Coho M.D.   On: 11/27/2023 13:56   CT HEAD WO CONTRAST Result Date: 11/27/2023 CLINICAL DATA:  Mental status changes. EXAM: CT HEAD WITHOUT CONTRAST TECHNIQUE: Contiguous axial images were obtained from the base of the skull through the vertex without intravenous contrast. RADIATION DOSE REDUCTION: This exam was performed according to the departmental dose-optimization program which includes automated exposure control, adjustment of the mA and/or kV according to patient size and/or use of iterative reconstruction technique. COMPARISON:  Brain MRI 11/21/2023.  Head CT 11/20/2023 FINDINGS: Brain: There is no evidence for acute hemorrhage, hydrocephalus, mass lesion, or abnormal extra-axial fluid collection. No definite CT evidence for acute infarction. Diffuse loss of parenchymal volume is consistent with atrophy. Patchy low attenuation in the deep hemispheric and periventricular white matter is nonspecific, but likely reflects chronic microvascular ischemic demyelination. 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 Vascular: No hyperdense vessel or unexpected calcification. Skull: No evidence for fracture. No worrisome lytic or sclerotic lesion. Sinuses/Orbits: The visualized paranasal sinuses and mastoid air cells are clear. Visualized portions of the globes and intraorbital fat are unremarkable. Other: None. IMPRESSION: 1. No acute intracranial abnormality. 2. Atrophy with chronic small vessel ischemic disease. Electronically Signed   By: Camellia Candle M.D.   On: 11/27/2023 06:09   DG Chest Port 1 View Result Date: 11/27/2023 CLINICAL DATA:  68 year old female with altered mental status and hypoxia. EXAM: PORTABLE CHEST 1 VIEW COMPARISON:  Portable chest 05/20/2023 and earlier. FINDINGS: Portable AP semi upright view at 0355 hours. Calcified aortic atherosclerosis. Other mediastinal contours are within normal limits.  Lung volumes are within normal limits. Visualized tracheal air column is within normal limits. Allowing for portable technique the lungs are clear. No pneumothorax or pleural effusion. No acute osseous abnormality identified. Negative visible bowel gas. IMPRESSION: No acute cardiopulmonary abnormality. Aortic Atherosclerosis (ICD10-I70.0). Electronically Signed   By: VEAR Hurst M.D.   On: 11/27/2023 04:09   EEG adult Result Date: 11/21/2023 Shelton Arlin KIDD, MD     11/21/2023  5:33 PM Patient Name: CASEE KNEPP MRN: [id number] Epilepsy Attending: Arlin KIDD Shelton Referring Physician/Provider: Shelton Arlin KIDD, MD Date: 11/21/2023 Duration: 23.40 mins Patient history: 68 year old female presented with altered mental status and left-sided weakness. EEG to evaluate for seizure  Level of alertness: Awake, asleep AEDs during EEG study: None Technical aspects: This EEG study was done with scalp electrodes positioned according to the 10-20 International system of electrode placement. Electrical activity was reviewed with band pass filter of 1-70Hz , sensitivity of 7 uV/mm, display speed of 53mm/sec with a 60Hz  notched filter applied as appropriate. EEG data were recorded continuously and digitally stored.  Video monitoring was available and reviewed as appropriate. Description: The posterior dominant rhythm consists of 6 Hz activity of moderate voltage (25-35 uV) seen predominantly in posterior head regions, symmetric and reactive to eye opening and eye closing. Sleep was characterized by vertex waves, sleep spindles (12 to 14 Hz), maximal frontocentral region. EEG showed continuous generalized predominantly 5  to 6 Hz theta-delta slowing admixed with intermittent 2-3hz  delta slowing.  Physiologic photic driving was not seen during photic stimulation.  Hyperventilation was not performed.   ABNORMALITY - Continuous slow, generalized IMPRESSION: This study is suggestive of moderate diffuse encephalopathy. No seizures or  epileptiform discharges were seen throughout the recording. Arlin MALVA Krebs   ECHOCARDIOGRAM COMPLETE Result Date: 11/21/2023    ECHOCARDIOGRAM REPORT   Patient Name:   LITHA LAMARTINA Date of Exam: 11/21/2023 Medical Rec #:  [id number]     Height:       67.0 in Accession #:    [id number]    Weight:       209.7 lb Date of Birth:  13-Dec-1955    BSA:          2.063 m Patient Age:    67 years      BP:           116/58 mmHg Patient Gender: F             HR:           77 bpm. Exam Location:  Inpatient Procedure: 2D Echo, Cardiac Doppler and Color Doppler (Both Spectral and Color            Flow Doppler were utilized during procedure). Indications:    Stroke I63.9  History:        Patient has prior history of Echocardiogram examinations, most                 recent 06/16/2023.  Sonographer:    Tinnie Gosling RDCS Referring Phys: [phone] TIMOTHY S OPYD IMPRESSIONS  1. Left ventricular ejection fraction, by estimation, is 65 to 70%. The left ventricle has normal function. The left ventricle has no regional wall motion abnormalities. Left ventricular diastolic parameters are indeterminate.  2. Right ventricular systolic function is normal. The right ventricular size is normal. Tricuspid regurgitation signal is inadequate for assessing PA pressure.  3. The mitral valve is grossly normal. Trivial mitral valve regurgitation.  4. The aortic valve is tricuspid. There is mild calcification of the aortic valve. Aortic valve regurgitation is not visualized. Aortic valve sclerosis/calcification is present, without any evidence of aortic stenosis.  5. The inferior vena cava is normal in size with greater than 50% respiratory variability, suggesting right atrial pressure of 3 mmHg. Comparison(s): Prior images reviewed side by side. LVEF 65-70% range. Mildly calcified aortic valve without stenosis. Interatrial septum bows to the right, no obvious shunting. FINDINGS  Left Ventricle: Left ventricular ejection fraction, by estimation, is 65  to 70%. The left ventricle has normal function. The left ventricle has no regional wall motion abnormalities. The left ventricular internal cavity size was normal in size. There is  borderline concentric left ventricular hypertrophy.  Left ventricular diastolic parameters are indeterminate. Right Ventricle: The right ventricular size is normal. No increase in right ventricular wall thickness. Right ventricular systolic function is normal. Tricuspid regurgitation signal is inadequate for assessing PA pressure. Left Atrium: Left atrial size was normal in size. Right Atrium: Right atrial size was normal in size. Pericardium: There is no evidence of pericardial effusion. Mitral Valve: The mitral valve is grossly normal. Trivial mitral valve regurgitation. Tricuspid Valve: The tricuspid valve is grossly normal. Tricuspid valve regurgitation is trivial. Aortic Valve: The aortic valve is tricuspid. There is mild calcification of the aortic valve. There is mild aortic valve annular calcification. Aortic valve regurgitation is not visualized. Aortic valve sclerosis/calcification is present, without any evidence of aortic stenosis. Pulmonic Valve: The pulmonic valve was grossly normal. Pulmonic valve regurgitation is trivial. Aorta: The aortic root and ascending aorta are structurally normal, with no evidence of dilitation. Venous: The inferior vena cava is normal in size with greater than 50% respiratory variability, suggesting right atrial pressure of 3 mmHg. IAS/Shunts: There is right bowing of the interatrial septum, suggestive of elevated left atrial pressure. No atrial level shunt detected by color flow Doppler. Additional Comments: 3D was performed not requiring image post processing on an independent workstation and was indeterminate.  LEFT VENTRICLE PLAX 2D LVIDd:         5.10 cm   Diastology LVIDs:         3.30 cm   LV e' medial:    8.59 cm/s LV PW:         1.10 cm   LV E/e' medial:  9.8 LV IVS:        1.00 cm   LV  e' lateral:   9.79 cm/s LVOT diam:     1.90 cm   LV E/e' lateral: 8.6 LV SV:         60 LV SV Index:   29 LVOT Area:     2.84 cm  RIGHT VENTRICLE         IVC TAPSE (M-mode): 1.7 cm  IVC diam: 1.40 cm LEFT ATRIUM             Index        RIGHT ATRIUM           Index LA diam:        3.30 cm 1.60 cm/m   RA Area:     13.70 cm LA Vol (A2C):   33.8 ml 16.38 ml/m  RA Volume:   33.40 ml  16.19 ml/m LA Vol (A4C):   53.4 ml 25.88 ml/m LA Biplane Vol: 47.1 ml 22.83 ml/m  AORTIC VALVE LVOT Vmax:   112.00 cm/s LVOT Vmean:  67.700 cm/s LVOT VTI:    0.211 m  AORTA Ao Root diam: 3.20 cm Ao Asc diam:  3.70 cm MITRAL VALVE MV Area (PHT): 3.00 cm     SHUNTS MV Decel Time: 253 msec     Systemic VTI:  0.21 m MV E velocity: 84.00 cm/s   Systemic Diam: 1.90 cm MV A velocity: 119.00 cm/s MV E/A ratio:  0.71 Jayson Sierras MD Electronically signed by Jayson Sierras MD Signature Date/Time: 11/21/2023/3:04:47 PM    Final    MR ANGIO HEAD WO CONTRAST Result Date: 11/21/2023 CLINICAL DATA:  68 year old female neurologic deficit. Code stroke presentation yesterday. 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 EXAM: MRA HEAD WITHOUT CONTRAST TECHNIQUE: Angiographic images of the Circle of Willis were acquired using MRA technique without intravenous contrast. COMPARISON:  Brain MRI today.  CTA head and neck yesterday. FINDINGS: Anterior circulation: Antegrade flow in both ICA siphons. Patent carotid termini. Bilateral siphon irregularity but no significant siphon stenosis. MCA and ACA origins appear normal. Anterior communicating artery and median artery of the corpus callosum are present. Left ACA appears dominant throughout. Severe stenosis of the proximal right ACA A2 on series 1033, image 13. Bilateral MCA M1 segments and bifurcations are patent. Multifocal moderate and severe bilateral MCA branch irregularity and stenosis as seen on series 1033, images 4 and 14. Posterior circulation: Patent distal vertebral arteries,  vertebrobasilar junction, basilar artery without stenosis. Patent PCA origins. Posterior communicating arteries are diminutive or absent. Moderate to severe stenosis left PCA P2 segment (series 1021, image 4). Mild contralateral right P1 and P2 segment stenosis. Evidence of moderate and severe PCA P3 segment stenosis, including on the right side series 1021, image 17. Anatomic variants: Median artery of the corpus callosum. Other: Brain MRI today reported separately. IMPRESSION: 1. Negative for large vessel occlusion. 2. Positive for advanced intracranial atherosclerosis. No significant 1st order vessel stenosis, but moderate to severe stenosis in anterior and posterior circulation 2nd order and distal branches. 3. MRI today reported separately. Electronically Signed   By: VEAR Hurst M.D.   On: 11/21/2023 12:11   MR BRAIN WO CONTRAST Result Date: 11/21/2023 CLINICAL DATA:  68 year old female neurologic deficit. Code stroke presentation yesterday. 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 EXAM: MRI HEAD WITHOUT CONTRAST TECHNIQUE: Multiplanar, multiecho pulse sequences of the brain and surrounding structures were obtained without intravenous contrast. COMPARISON:  CT head and CTA head and neck yesterday. Brain MRI 05/21/2023. FINDINGS: Brain: No restricted diffusion to suggest acute infarction. No midline shift, mass effect, evidence of mass lesion, ventriculomegaly, extra-axial collection or acute intracranial hemorrhage. Cervicomedullary junction and pituitary are within normal limits. Numerous chronic microhemorrhages, concentrated in the brainstem, periventricular white matter, right mesial temporal lobe as before. Superimposed advanced, confluent bilateral cerebral white matter T2 and FLAIR hyperintensity. Chronic lacunar infarcts in the bilateral deep white matter capsules, bilateral deep gray nuclei, brainstem, bilateral cerebellum. Regressed mild brainstem edema seen in December. No new signal  abnormality identified. Vascular: Major intracranial vascular flow voids are stable since last year. MRA today is reported separately. Skull and upper cervical spine: Stable and negative. Sinuses/Orbits: Stable and negative. Other: Stable trace mastoid effusions. Negative visible scalp and face. IMPRESSION: 1. No acute intracranial abnormality. 2. Expected evolution of small brainstem hemorrhage since December. And otherwise stable severe underlying chronic small vessel disease. 3. MRA reported separately. Electronically Signed   By: VEAR Hurst M.D.   On: 11/21/2023 12:07   CT ANGIO HEAD NECK W WO CM (CODE STROKE) Result Date: 11/20/2023 CLINICAL DATA:  Initial evaluation for acute neuro deficit, stroke suspected. EXAM: CT ANGIOGRAPHY HEAD AND NECK WITH AND WITHOUT CONTRAST TECHNIQUE: Multidetector CT imaging of the head and neck was performed using the standard protocol during bolus administration of intravenous contrast. Multiplanar CT image reconstructions and MIPs were obtained to evaluate the vascular anatomy. Carotid stenosis measurements (when applicable) are obtained utilizing NASCET criteria, using the distal internal carotid diameter as the denominator. RADIATION DOSE REDUCTION: This exam was performed according to the departmental dose-optimization program which includes automated exposure control, adjustment of the mA and/or kV according to patient size and/or use of iterative reconstruction technique. CONTRAST:  75mL OMNIPAQUE  IOHEXOL  350 MG/ML SOLN COMPARISON:  Comparison made with head CT from earlier the same day as well as prior CTA from  05/20/2023. FINDINGS: CTA NECK FINDINGS Aortic arch: Visualized aortic arch within normal limits for caliber with standard 3 vessel morphology. Aortic atherosclerosis. No significant stenosis about the origin the great vessels. Right carotid system: Right common and internal carotid arteries are patent without dissection. Mild atheromatous change about the right  carotid bulb without hemodynamically significant greater than 50% stenosis. Left carotid system: Left common and internal carotid arteries are patent without dissection. Mild atheromatous change about the left carotid bulb without hemodynamically significant stenosis. Vertebral arteries: Both vertebral arteries arise from subclavian arteries. Vertebral arteries are patent without stenosis or dissection. Skeleton: No worrisome osseous lesions. Other neck: No other acute finding. Upper chest: No other acute finding. Review of the MIP images confirms the above findings CTA HEAD FINDINGS Anterior circulation: Atheromatous change about the carotid siphons with associated mild to moderate multifocal narrowing bilaterally. A1 segments patent bilaterally. Normal anterior communicating artery complex. Anterior cerebral arteries patent without significant stenosis. No M1 stenosis or occlusion. Distal MCA branches perfused and symmetric. Posterior circulation: Both V4 segments patent without stenosis. Both PICA patent at their origins. Basilar patent without stenosis. Superior cerebral arteries patent bilaterally. Both PCAs primarily supplied via the basilar. Atheromatous irregularity about the PCAs bilaterally the with associated mild to moderate bilateral P2 stenoses. PCAs remain patent to their distal aspects. Venous sinuses: Patent allowing for timing the contrast bolus. Anatomic variants: None significant.  No aneurysm. Review of the MIP images confirms the above findings IMPRESSION: 1. Negative CTA for large vessel occlusion or other emergent finding. 2. Intracranial atherosclerotic disease, most notably about the carotid siphons and PCAs where there are associated mild to moderate multifocal stenoses. 3. Mild atheromatous change about the carotid bifurcations without significant stenosis. No hemodynamically significant stenosis within the neck. 4. Aortic Atherosclerosis (ICD10-I70.0). Results were called by telephone at  the time of interpretation on 11/20/2023 at 10:18 pm to provider [facility] , who verbally acknowledged these results. Electronically Signed   By: Morene Hoard M.D.   On: 11/20/2023 22:19   CT HEAD CODE STROKE WO CONTRAST Result Date: 11/20/2023 CLINICAL DATA:  Code stroke. Initial evaluation for acute neuro deficit, stroke suspected. EXAM: CT HEAD WITHOUT CONTRAST TECHNIQUE: Contiguous axial images were obtained from the base of the skull through the vertex without intravenous contrast. RADIATION DOSE REDUCTION: This exam was performed according to the departmental dose-optimization program which includes automated exposure control, adjustment of the mA and/or kV according to patient size and/or use of iterative reconstruction technique. COMPARISON:  Prior study from 05/20/2023 FINDINGS: Brain: Generalized age-related cerebral atrophy. Patchy hypodensity involving the supratentorial cerebral white matter, consistent chronic small vessel ischemic disease, advanced in nature. Remote right basal ganglia lacunar infarct. Additional chronic left cerebellar infarct. No acute intracranial hemorrhage. No acute large vessel territory infarct. No mass lesion or midline shift. No hydrocephalus or extra-axial fluid collection. Vascular: No abnormal hyperdense vessel. Calcified atherosclerosis present about the skull base. Skull: Scalp soft tissues within normal limits.  Calvarium intact. Sinuses/Orbits: Globes and orbital soft tissues within normal limits. Small osteoma noted within the right ethmoidal air cells. Paranasal sinuses are largely clear. No significant mastoid effusion. Other: None. ASPECTS [facility] Stroke Program Early CT Score) - Ganglionic level infarction (caudate, lentiform nuclei, internal capsule, insula, M1-M3 cortex): 7 - Supraganglionic infarction (M4-M6 cortex): 3 Total score (0-10 with 10 being normal): 10 IMPRESSION: 1. No acute intracranial abnormality. 2. ASPECTS is 10. 3. Atrophy with  advanced chronic microvascular ischemic disease, with chronic right basal ganglia and left  cerebellar infarcts. Results were called by telephone at the time of interpretation on 11/20/2023 at 9:55 pm to provider [facility] , who verbally acknowledged these results. Electronically Signed   By: Morene Hoard M.D.   On: 11/20/2023 22:00    Micro Results    Recent Results (from the past 240 hours)  Culture, blood (routine x 2)     Status: None   Collection Time: 11/27/23  4:04 AM   Specimen: BLOOD  Result Value Ref Range Status   Specimen Description BLOOD BLOOD LEFT HAND  Final   Special Requests   Final    BOTTLES DRAWN AEROBIC ONLY Blood Culture adequate volume   Culture   Final    NO GROWTH 5 DAYS Performed at [facility], [address] [place], [place] [address]    Report Status 12/02/2023 FINAL  Final  Culture, blood (routine x 2)     Status: None   Collection Time: 11/27/23  5:02 AM   Specimen: BLOOD  Result Value Ref Range Status   Specimen Description BLOOD BLOOD LEFT HAND AEROBIC BOTTLE ONLY  Final   Special Requests   Final    BOTTLES DRAWN AEROBIC ONLY Blood Culture adequate volume   Culture   Final    NO GROWTH 5 DAYS Performed at [facility], [address] [place], [place] [address]    Report Status 12/02/2023 FINAL  Final    Today   Subjective    Jennifer [name] today has no headache,no chest abdominal pain,no new weakness tingling or numbness, feels much better   Objective   Blood pressure (!) 120/48, pulse 91, temperature 98.3 F (36.8 C), temperature source Oral, resp. rate 16, height 5' 7 (1.702 m), weight 90.1 kg, SpO2 94%.   Intake/Output Summary (Last 24 hours) at 12/04/2023 0738 Last data filed at 12/03/2023 2137 Gross per 24 hour  Intake 243 ml  Output --  Net 243 ml    Exam  Awake, pleasantly confused, chronic right-sided facial droop from previous stroke, no new F.N deficits,    [place].AT,PERRAL Supple Neck,    Symmetrical Chest wall movement, Good air movement bilaterally, CTAB RRR,No Gallops,   +ve B.Sounds, Abd Soft, Non tender,  Multiple chronic bruises, right shin hematoma stable   Data Review   Recent Labs  Lab 11/28/23 0532 11/29/23 0548 11/30/23 0608 12/01/23 0731  WBC 11.4* 7.4 7.3 6.6  HGB 7.5* 8.4* 9.1* 8.2*  HCT 24.2* 27.3* 30.1* 27.0*  PLT 253 240 311 284  MCV 100.8* 100.0 102.0* 99.3  MCH 31.3 30.8 30.8 30.1  MCHC 31.0 30.8 30.2 30.4  RDW 15.9* 16.2* 16.1* 15.9*  LYMPHSABS  --  2.5  --   --   MONOABS  --  0.6  --   --   EOSABS  --  0.2  --   --   BASOSABS  --  0.0  --   --     Recent Labs  Lab 11/27/23 0739 11/28/23 0532 11/29/23 0548 11/29/23 1410 11/30/23 0608 12/01/23 0731  NA  --  136 136  --  138 135  K  --  4.1 4.2  --  4.5 4.2  CL  --  95* 95*  --  97* 96*  CO2  --  27 27  --  27 26  ANIONGAP  --  14 14  --  14 13  GLUCOSE  --  153* 100*  --  131* 154*  BUN  --  30* 38*  --  27* 46*  CREATININE  --  4.59* 6.06*  --  4.54* 5.89*  AST  --   --  16  --   --   --   ALT  --   --  14  --   --   --   ALKPHOS  --   --  43  --   --   --   BILITOT  --   --  0.6  --   --   --   ALBUMIN  --  2.3* 2.1*  --  2.3* 2.3*  DDIMER 1.14*  --   --   --   --   --   HGBA1C  --   --   --  5.7*  --   --   BNP  --  50.2  --   --   --   --   MG  --   --  2.2  --   --   --   PHOS  --  3.6 4.8*  --  4.0 5.0*  CALCIUM   --  8.5* 8.4*  --  8.5* 8.6*    Total Time in preparing paper work, data evaluation and todays exam - 35 minutes  Signature  -    Lavada Stank M.D on 12/04/2023 at 7:38 AM   -  To page go to [email or web address]

## 2023-12-05 DIAGNOSIS — I2609 Other pulmonary embolism with acute cor pulmonale: Secondary | ICD-10-CM | POA: Insufficient documentation

## 2023-12-07 ENCOUNTER — Inpatient Hospital Stay (HOSPITAL_COMMUNITY)
Admission: EM | Admit: 2023-12-07 | Discharge: 2023-12-12 | DRG: 871 | Disposition: A | Discharge status: Home Health | Source: Skilled Nursing Facility | Attending: Internal Medicine | Admitting: Internal Medicine

## 2023-12-07 ENCOUNTER — Emergency Department (HOSPITAL_COMMUNITY)

## 2023-12-07 ENCOUNTER — Encounter (HOSPITAL_COMMUNITY): Payer: Self-pay

## 2023-12-07 DIAGNOSIS — E11649 Type 2 diabetes mellitus with hypoglycemia without coma: Secondary | ICD-10-CM | POA: Diagnosis present

## 2023-12-07 DIAGNOSIS — Z823 Family history of stroke: Secondary | ICD-10-CM

## 2023-12-07 DIAGNOSIS — I69392 Facial weakness following cerebral infarction: Secondary | ICD-10-CM

## 2023-12-07 DIAGNOSIS — R579 Shock, unspecified: Secondary | ICD-10-CM | POA: Diagnosis present

## 2023-12-07 DIAGNOSIS — R6521 Severe sepsis with septic shock: Secondary | ICD-10-CM | POA: Diagnosis present

## 2023-12-07 DIAGNOSIS — F0394 Unspecified dementia, unspecified severity, with anxiety: Secondary | ICD-10-CM | POA: Diagnosis present

## 2023-12-07 DIAGNOSIS — R627 Adult failure to thrive: Secondary | ICD-10-CM | POA: Diagnosis present

## 2023-12-07 DIAGNOSIS — D631 Anemia in chronic kidney disease: Secondary | ICD-10-CM | POA: Diagnosis present

## 2023-12-07 DIAGNOSIS — F0393 Unspecified dementia, unspecified severity, with mood disturbance: Secondary | ICD-10-CM | POA: Diagnosis present

## 2023-12-07 DIAGNOSIS — I3139 Other pericardial effusion (noninflammatory): Secondary | ICD-10-CM | POA: Diagnosis present

## 2023-12-07 DIAGNOSIS — I120 Hypertensive chronic kidney disease with stage 5 chronic kidney disease or end stage renal disease: Secondary | ICD-10-CM | POA: Diagnosis present

## 2023-12-07 DIAGNOSIS — E8809 Other disorders of plasma-protein metabolism, not elsewhere classified: Secondary | ICD-10-CM | POA: Diagnosis present

## 2023-12-07 DIAGNOSIS — I69354 Hemiplegia and hemiparesis following cerebral infarction affecting left non-dominant side: Secondary | ICD-10-CM

## 2023-12-07 DIAGNOSIS — Z79890 Hormone replacement therapy (postmenopausal): Secondary | ICD-10-CM

## 2023-12-07 DIAGNOSIS — E1122 Type 2 diabetes mellitus with diabetic chronic kidney disease: Secondary | ICD-10-CM | POA: Diagnosis present

## 2023-12-07 DIAGNOSIS — E876 Hypokalemia: Secondary | ICD-10-CM | POA: Diagnosis present

## 2023-12-07 DIAGNOSIS — I2699 Other pulmonary embolism without acute cor pulmonale: Secondary | ICD-10-CM | POA: Diagnosis present

## 2023-12-07 DIAGNOSIS — N390 Urinary tract infection, site not specified: Secondary | ICD-10-CM | POA: Diagnosis present

## 2023-12-07 DIAGNOSIS — N2581 Secondary hyperparathyroidism of renal origin: Secondary | ICD-10-CM | POA: Diagnosis present

## 2023-12-07 DIAGNOSIS — Z90710 Acquired absence of both cervix and uterus: Secondary | ICD-10-CM

## 2023-12-07 DIAGNOSIS — N186 End stage renal disease: Secondary | ICD-10-CM | POA: Diagnosis present

## 2023-12-07 DIAGNOSIS — J9601 Acute respiratory failure with hypoxia: Secondary | ICD-10-CM | POA: Diagnosis present

## 2023-12-07 DIAGNOSIS — G9341 Metabolic encephalopathy: Secondary | ICD-10-CM | POA: Diagnosis present

## 2023-12-07 DIAGNOSIS — E7800 Pure hypercholesterolemia, unspecified: Secondary | ICD-10-CM | POA: Diagnosis present

## 2023-12-07 DIAGNOSIS — Z79899 Other long term (current) drug therapy: Secondary | ICD-10-CM

## 2023-12-07 DIAGNOSIS — A419 Sepsis, unspecified organism: Secondary | ICD-10-CM | POA: Diagnosis not present

## 2023-12-07 DIAGNOSIS — Z8249 Family history of ischemic heart disease and other diseases of the circulatory system: Secondary | ICD-10-CM

## 2023-12-07 DIAGNOSIS — J189 Pneumonia, unspecified organism: Secondary | ICD-10-CM | POA: Diagnosis present

## 2023-12-07 DIAGNOSIS — F32A Depression, unspecified: Secondary | ICD-10-CM | POA: Diagnosis present

## 2023-12-07 DIAGNOSIS — Z7982 Long term (current) use of aspirin: Secondary | ICD-10-CM

## 2023-12-07 DIAGNOSIS — Z955 Presence of coronary angioplasty implant and graft: Secondary | ICD-10-CM

## 2023-12-07 DIAGNOSIS — Z888 Allergy status to other drugs, medicaments and biological substances status: Secondary | ICD-10-CM

## 2023-12-07 DIAGNOSIS — Z87891 Personal history of nicotine dependence: Secondary | ICD-10-CM

## 2023-12-07 DIAGNOSIS — Z6832 Body mass index (BMI) 32.0-32.9, adult: Secondary | ICD-10-CM

## 2023-12-07 DIAGNOSIS — Z794 Long term (current) use of insulin: Secondary | ICD-10-CM

## 2023-12-07 DIAGNOSIS — Z7901 Long term (current) use of anticoagulants: Secondary | ICD-10-CM

## 2023-12-07 DIAGNOSIS — E66811 Obesity, class 1: Secondary | ICD-10-CM | POA: Diagnosis present

## 2023-12-07 DIAGNOSIS — E039 Hypothyroidism, unspecified: Secondary | ICD-10-CM | POA: Diagnosis present

## 2023-12-07 DIAGNOSIS — I2510 Atherosclerotic heart disease of native coronary artery without angina pectoris: Secondary | ICD-10-CM | POA: Diagnosis present

## 2023-12-07 DIAGNOSIS — Z992 Dependence on renal dialysis: Secondary | ICD-10-CM

## 2023-12-07 DIAGNOSIS — Z96651 Presence of right artificial knee joint: Secondary | ICD-10-CM | POA: Diagnosis present

## 2023-12-07 LAB — I-STAT ARTERIAL BLOOD GAS, ED
Acid-Base Excess: 7 mmol/L — ABNORMAL HIGH (ref 0.0–2.0)
Bicarbonate: 32 mmol/L — ABNORMAL HIGH (ref 20.0–28.0)
Calcium, Ion: 1.17 mmol/L (ref 1.15–1.40)
HCT: 24 % — ABNORMAL LOW (ref 36.0–46.0)
Hemoglobin: 8.2 g/dL — ABNORMAL LOW (ref 12.0–15.0)
O2 Saturation: 97 %
Patient temperature: 95.6
Potassium: 4.1 mmol/L (ref 3.5–5.1)
Sodium: 138 mmol/L (ref 135–145)
TCO2: 33 mmol/L — ABNORMAL HIGH (ref 22–32)
pCO2 arterial: 45.2 mmHg (ref 32–48)
pH, Arterial: 7.452 — ABNORMAL HIGH (ref 7.35–7.45)
pO2, Arterial: 78 mmHg — ABNORMAL LOW (ref 83–108)

## 2023-12-07 LAB — CBG MONITORING, ED
Glucose-Capillary: 41 mg/dL — CL (ref 70–99)
Glucose-Capillary: 107 mg/dL — ABNORMAL HIGH (ref 70–99)
Glucose-Capillary: 82 mg/dL (ref 70–99)
Glucose-Capillary: 53 mg/dL — ABNORMAL LOW (ref 70–99)
Glucose-Capillary: 116 mg/dL — ABNORMAL HIGH (ref 70–99)

## 2023-12-07 LAB — CBC WITH DIFFERENTIAL/PLATELET
Abs Immature Granulocytes: 0.1 10*3/uL — ABNORMAL HIGH (ref 0.00–0.07)
Basophils Absolute: 0.1 10*3/uL (ref 0.0–0.1)
Basophils Relative: 1 %
Eosinophils Absolute: 0.1 10*3/uL (ref 0.0–0.5)
Eosinophils Relative: 1 %
HCT: 26.9 % — ABNORMAL LOW (ref 36.0–46.0)
Hemoglobin: 8.3 g/dL — ABNORMAL LOW (ref 12.0–15.0)
Immature Granulocytes: 1 %
Lymphocytes Relative: 13 %
Lymphs Abs: 0.9 10*3/uL (ref 0.7–4.0)
MCH: 30.9 pg (ref 26.0–34.0)
MCHC: 30.9 g/dL (ref 30.0–36.0)
MCV: 100 fL (ref 80.0–100.0)
Monocytes Absolute: 0.4 10*3/uL (ref 0.1–1.0)
Monocytes Relative: 5 %
Neutro Abs: 5.5 10*3/uL (ref 1.7–7.7)
Neutrophils Relative %: 79 %
Platelets: 358 10*3/uL (ref 150–400)
RBC: 2.69 MIL/uL — ABNORMAL LOW (ref 3.87–5.11)
RDW: 15.9 % — ABNORMAL HIGH (ref 11.5–15.5)
WBC: 7 10*3/uL (ref 4.0–10.5)
nRBC: 0 % (ref 0.0–0.2)

## 2023-12-07 LAB — COMPREHENSIVE METABOLIC PANEL WITH GFR
ALT: 15 U/L (ref 0–44)
AST: 17 U/L (ref 15–41)
Albumin: 2 g/dL — ABNORMAL LOW (ref 3.5–5.0)
Alkaline Phosphatase: 41 U/L (ref 38–126)
Anion gap: 4 — ABNORMAL LOW (ref 5–15)
BUN: 29 mg/dL — ABNORMAL HIGH (ref 8–23)
CO2: 25 mmol/L (ref 22–32)
Calcium: 7 mg/dL — ABNORMAL LOW (ref 8.9–10.3)
Chloride: 110 mmol/L (ref 98–111)
Creatinine, Ser: 4.72 mg/dL — ABNORMAL HIGH (ref 0.44–1.00)
GFR, Estimated: 10 mL/min — ABNORMAL LOW (ref >60)
Glucose, Bld: 102 mg/dL — ABNORMAL HIGH (ref 70–99)
Potassium: 3.4 mmol/L — ABNORMAL LOW (ref 3.5–5.1)
Sodium: 139 mmol/L (ref 135–145)
Total Bilirubin: 0.8 mg/dL (ref 0.0–1.2)
Total Protein: 4.6 g/dL — ABNORMAL LOW (ref 6.5–8.1)

## 2023-12-07 LAB — BRAIN NATRIURETIC PEPTIDE: B Natriuretic Peptide: 59.6 pg/mL (ref 0.0–100.0)

## 2023-12-07 LAB — PROTIME-INR
INR: 1.8 — ABNORMAL HIGH (ref 0.8–1.2)
Prothrombin Time: 21.5 s — ABNORMAL HIGH (ref 11.4–15.2)

## 2023-12-07 LAB — I-STAT CG4 LACTIC ACID, ED: Lactic Acid, Venous: 0.7 mmol/L (ref 0.5–1.9)

## 2023-12-07 LAB — T4, FREE: Free T4: 1.19 ng/dL — ABNORMAL HIGH (ref 0.61–1.12)

## 2023-12-07 LAB — TSH: TSH: 4.223 u[IU]/mL (ref 0.350–4.500)

## 2023-12-07 MED ORDER — SODIUM CHLORIDE 0.9 % IV SOLN
1.0000 g | Freq: Once | INTRAVENOUS | Status: AC
  Administered 2023-12-07: 1 g via INTRAVENOUS
  Filled 2023-12-07: qty 10

## 2023-12-07 MED ORDER — LACTATED RINGERS IV BOLUS
250.0000 mL | Freq: Once | INTRAVENOUS | Status: AC
  Administered 2023-12-07: 250 mL via INTRAVENOUS

## 2023-12-07 MED ORDER — DEXTROSE 50 % IV SOLN
1.0000 | Freq: Once | INTRAVENOUS | Status: AC
  Administered 2023-12-07: 50 mL via INTRAVENOUS
  Filled 2023-12-07: qty 50

## 2023-12-07 MED ORDER — ALBUMIN HUMAN 25 % IV SOLN
25.0000 g | Freq: Once | INTRAVENOUS | Status: AC
  Administered 2023-12-07: 25 g via INTRAVENOUS
  Filled 2023-12-07: qty 100

## 2023-12-07 MED ORDER — VANCOMYCIN HCL 2000 MG/400ML IV SOLN
2000.0000 mg | Freq: Once | INTRAVENOUS | Status: AC
  Administered 2023-12-07: 2000 mg via INTRAVENOUS
  Filled 2023-12-07: qty 400

## 2023-12-07 MED ORDER — LACTATED RINGERS IV BOLUS: 250.0000 mL | Freq: Once | INTRAVENOUS | Status: DC

## 2023-12-07 MED ORDER — SODIUM CHLORIDE 0.9 % IV BOLUS
500.0000 mL | Freq: Once | INTRAVENOUS | Status: AC
  Administered 2023-12-07: 500 mL via INTRAVENOUS

## 2023-12-07 MED ORDER — LACTATED RINGERS IV BOLUS
500.0000 mL | Freq: Once | INTRAVENOUS | Status: AC
  Administered 2023-12-07: 500 mL via INTRAVENOUS

## 2023-12-07 MED ORDER — DEXTROSE 50 % IV SOLN
50.0000 mL | Freq: Once | INTRAVENOUS | Status: AC
  Administered 2023-12-07: 50 mL via INTRAVENOUS
  Filled 2023-12-07: qty 50

## 2023-12-07 MED ORDER — IPRATROPIUM-ALBUTEROL 0.5-2.5 (3) MG/3ML IN SOLN
3.0000 mL | RESPIRATORY_TRACT | Status: AC
  Administered 2023-12-07 (×3): 3 mL via RESPIRATORY_TRACT
  Filled 2023-12-07: qty 9

## 2023-12-07 MED ORDER — NOREPINEPHRINE 4 MG/250ML-% IV SOLN
0.0000 ug/min | INTRAVENOUS | Status: DC
  Administered 2023-12-07 – 2023-12-08 (×2): 10 ug/min via INTRAVENOUS
  Filled 2023-12-07 (×2): qty 250

## 2023-12-07 MED ORDER — IOHEXOL 350 MG/ML SOLN
75.0000 mL | Freq: Once | INTRAVENOUS | Status: AC | PRN
  Administered 2023-12-07: 75 mL via INTRAVENOUS

## 2023-12-07 NOTE — ED Provider Notes (Addendum)
 [place] EMERGENCY DEPARTMENT AT [facility] Provider Note   CSN: [id number] Arrival date & time: 12/07/23  1610     History Chief Complaint  Patient presents with   Altered Mental Status   Shortness of Breath    Jennifer [name] is a 68 y.o. female w/ PMHx hypertension, CAD, CVA w/ left sided deficits, diabetes mellitus type 2, ESRD on HD, hypothyroidism, anxiety, depression, and memory loss altered mental status and hypoglycemia  who presents to the ED for evaluation of altered mental status.  Patient presents from [facility] for altered mental status hypoxia and.  Patient was 77% on room air placed on nonrebreather by EMS.  EMS report patient only had partial dialysis yesterday.       Physical Exam Updated Vital Signs BP (!) 146/34   Pulse 89   Temp 98.3 F (36.8 C) (Oral)   Resp 14   Ht 5' 7 (1.702 m)   Wt 90.7 kg   SpO2 92%   BMI 31.32 kg/m  Physical Exam Constitutional:      Appearance: She is obese. She is ill-appearing.  HENT:     Head: Normocephalic and atraumatic.   Eyes:     Pupils: Pupils are equal, round, and reactive to light.    Cardiovascular:     Rate and Rhythm: Normal rate and regular rhythm.  Pulmonary:     Breath sounds: Wheezing and rales present.   Skin:    General: Skin is warm and dry.     Capillary Refill: Capillary refill takes less than 2 seconds.   Neurological:     Mental Status: She is lethargic.     GCS: GCS eye subscore is 3. GCS verbal subscore is 2. GCS motor subscore is 6.     Comments: Patient will follow some commands.    ED Results / Procedures / Treatments   Labs (all labs ordered are listed, but only abnormal results are displayed) Labs Reviewed  CBC WITH DIFFERENTIAL/PLATELET - Abnormal; Notable for the following components:      Result Value   RBC 2.69 (*)    Hemoglobin 8.3 (*)    HCT 26.9 (*)    RDW 15.9 (*)    Abs Immature Granulocytes 0.10 (*)    All other components within normal limits   PROTIME-INR - Abnormal; Notable for the following components:   Prothrombin Time 21.5 (*)    INR 1.8 (*)    All other components within normal limits  COMPREHENSIVE METABOLIC PANEL WITH GFR - Abnormal; Notable for the following components:   Potassium 3.4 (*)    Glucose, Bld 102 (*)    BUN 29 (*)    Creatinine, Ser 4.72 (*)    Calcium  7.0 (*)    Total Protein 4.6 (*)    Albumin  2.0 (*)    GFR, Estimated 10 (*)    Anion gap 4 (*)    All other components within normal limits  T4, FREE - Abnormal; Notable for the following components:   Free T4 1.19 (*)    All other components within normal limits  CBG MONITORING, ED - Abnormal; Notable for the following components:   Glucose-Capillary 41 (*)    All other components within normal limits  I-STAT ARTERIAL BLOOD GAS, ED - Abnormal; Notable for the following components:   pH, Arterial 7.452 (*)    pO2, Arterial 78 (*)    Bicarbonate 32.0 (*)    TCO2 33 (*)    Acid-Base Excess 7.0 (*)  HCT 24.0 (*)    Hemoglobin 8.2 (*)    All other components within normal limits  CBG MONITORING, ED - Abnormal; Notable for the following components:   Glucose-Capillary 107 (*)    All other components within normal limits  CBG MONITORING, ED - Abnormal; Notable for the following components:   Glucose-Capillary 53 (*)    All other components within normal limits  CBG MONITORING, ED - Abnormal; Notable for the following components:   Glucose-Capillary 116 (*)    All other components within normal limits  CULTURE, BLOOD (ROUTINE X 2)  CULTURE, BLOOD (ROUTINE X 2)  TSH  BRAIN NATRIURETIC PEPTIDE  URINALYSIS, W/ REFLEX TO CULTURE (INFECTION SUSPECTED)  I-STAT CG4 LACTIC ACID, ED  CBG MONITORING, ED    EKG EKG Interpretation Date/Time:  Thursday December 07 2023 16:12:59 EDT Ventricular Rate:  70 PR Interval:  203 QRS Duration:  115 QT Interval:  443 QTC Calculation: 478 R Axis:   57  Text Interpretation: Sinus rhythm Incomplete left bundle  branch block Confirmed by Francesca Fallow ([id number]) on 12/07/2023 7:57:28 PM  Radiology DG Chest Portable 1 View Result Date: 12/07/2023 CLINICAL DATA:  Hypoxia. EXAM: PORTABLE CHEST 1 VIEW COMPARISON:  November 29, 2023 FINDINGS: The heart size and mediastinal contours are within normal limits. Both lungs are clear. The visualized skeletal structures are unremarkable. IMPRESSION: No active disease. Electronically Signed   By: Lynwood Landy Raddle M.D.   On: 12/07/2023 16:51    Medications Ordered in ED Medications  norepinephrine  (LEVOPHED ) 4mg  in (0.016 mg/mL) premix infusion (10 mcg/min Intravenous Rate/Dose Change 12/07/23 2144)  dextrose  50 % solution 50 mL (50 mLs Intravenous Given 12/07/23 1620)  ipratropium-albuterol  (DUONEB) 0.5-2.5 (3) MG/3ML nebulizer solution 3 mL (3 mLs Nebulization Given 12/07/23 1630)  ceFEPIme  (MAXIPIME ) 1 g in sodium chloride  0.9 % 100 mL IVPB (0 g Intravenous Stopped 12/07/23 1810)  vancomycin  (VANCOREADY) IVPB 2000 mg/400 mL (0 mg Intravenous Stopped 12/07/23 2103)  lactated ringers  bolus 250 mL (0 mLs Intravenous Stopped 12/07/23 1933)  lactated ringers  bolus 500 mL (0 mLs Intravenous Stopped 12/07/23 2006)  albumin  human 25 % solution 25 g (0 g Intravenous Stopped 12/07/23 2150)  dextrose  50 % solution 50 mL (50 mLs Intravenous Given 12/07/23 2122)  sodium chloride  0.9 % bolus 500 mL (500 mLs Intravenous New Bag/Given 12/07/23 2154)  iohexol  (OMNIPAQUE ) 350 MG/ML injection 75 mL (75 mLs Intravenous Contrast Given 12/07/23 2219)    ED Course/ Medical Decision Making/ A&P  Jennifer [name] is a 68 y.o. female presents as detailed above  Differential ddx: Sepsis, hypothyroidism, hypoglycemia, volume overload, hypercapnia, acute hypoxic respiratory failure, pneumonia, uremia, intracranial abnormality, worsening PE  ED Work-up: Please see details of labs and imaging listed above. On arrival blood glucose 41.  Patient received amp of D50.   Patient subsequently speaking  in full coherent sentences.  States she has left-sided deficits from recent stroke.  Patient still very somnolent however GCS significantly improved.  Will defer intubation at this time.  Arterial blood gas without hypercapnia or acidosis. Will administer 3 DuoNeb treatments.  Patient able to be transitioned off nonrebreather onto room air.   Repeat blood glucose 107.  Will continue to closely monitor. Chest x-ray without pneumonia pulmonary edema or other significant abnormality EKG normal sinus rhythm no evidence of arrhythmia or acute ischemia. Blood pressure slowly downtrended.  She had good response to 750 cc however had subsequent hypotension.  Due to ESRD did not want to give patient  any more fluids.  Will initiate Levophed .  Patient had response to Levophed .   Etiology likely sepsis with unknown source at this time Case discussed with critical care unit who agrees with admission for further evaluation and treatment. CTA PE and CT head pending at time of admission    Patient seen with supervising physician who agrees with plan.  Final Clinical Impression(s) / ED Diagnoses Final diagnoses:  Sepsis with acute hypoxic respiratory failure without septic shock, due to unspecified organism [facility])    Waddell Seats, DO PGY-3 Emergency Medicine    Seats Waddell, DO 12/07/23 2320    Francesca Elsie CROME, MD 12/09/23 1850   CRITICAL CARE Performed by: Elsie CROME Francesca   Total critical care time: 30 minutes  Critical care time was exclusive of separately billable procedures and treating other patients.  Critical care was necessary to treat or prevent imminent or life-threatening deterioration.  Critical care was time spent personally by me on the following activities: development of treatment plan with patient and/or surrogate as well as nursing, discussions with consultants, evaluation of patient's response to treatment, examination of patient, obtaining history from patient  or surrogate, ordering and performing treatments and interventions, ordering and review of laboratory studies, ordering and review of radiographic studies, pulse oximetry and re-evaluation of patient's condition.    Francesca Elsie CROME, MD 01/15/24 [phone]

## 2023-12-07 NOTE — ED Notes (Addendum)
 Pts bp 84/29, advised DO Sharpe bolus ordered.

## 2023-12-07 NOTE — ED Triage Notes (Addendum)
 Per EMS, Pt, from [facility], presents w/ AMS, hypoxia, and hyperglycemia.  Pt was 77% on RA and is 100% on non-rebreather.  Pt was just discharged for similar on 6/23.  Hx of stroke.   EMS reports Pt only had partial dialysis yesterday.

## 2023-12-07 NOTE — Progress Notes (Signed)
 ED Pharmacy Antibiotic Sign Off An antibiotic consult was received from an ED provider for cefepime and vancomycin  per pharmacy dosing for sepsis. A chart review was completed to assess appropriateness.  The following one time order(s) were placed per pharmacy consult:  cefepime 1000 mg x 1 dose vancomycin  2000 mg x 1 dose  Further antibiotic and/or antibiotic pharmacy consults should be ordered by the admitting provider if indicated.   Thank you for allowing pharmacy to be a part of this patient's care.   Dorn Buttner, PharmD, BCPS 12/07/2023 4:42 PM ED Clinical Pharmacist -  [phone]

## 2023-12-07 NOTE — ED Notes (Signed)
 2nd RN was able to obtain blue blood culture.

## 2023-12-07 NOTE — ED Notes (Signed)
 Ptss bp

## 2023-12-08 ENCOUNTER — Inpatient Hospital Stay (HOSPITAL_COMMUNITY)

## 2023-12-08 DIAGNOSIS — I080 Rheumatic disorders of both mitral and aortic valves: Secondary | ICD-10-CM | POA: Diagnosis not present

## 2023-12-08 DIAGNOSIS — F32A Depression, unspecified: Secondary | ICD-10-CM | POA: Diagnosis present

## 2023-12-08 DIAGNOSIS — R680 Hypothermia, not associated with low environmental temperature: Secondary | ICD-10-CM

## 2023-12-08 DIAGNOSIS — Z6832 Body mass index (BMI) 32.0-32.9, adult: Secondary | ICD-10-CM | POA: Diagnosis not present

## 2023-12-08 DIAGNOSIS — F0393 Unspecified dementia, unspecified severity, with mood disturbance: Secondary | ICD-10-CM | POA: Diagnosis present

## 2023-12-08 DIAGNOSIS — I120 Hypertensive chronic kidney disease with stage 5 chronic kidney disease or end stage renal disease: Secondary | ICD-10-CM | POA: Diagnosis present

## 2023-12-08 DIAGNOSIS — R579 Shock, unspecified: Secondary | ICD-10-CM | POA: Diagnosis not present

## 2023-12-08 DIAGNOSIS — I517 Cardiomegaly: Secondary | ICD-10-CM | POA: Diagnosis not present

## 2023-12-08 DIAGNOSIS — I3139 Other pericardial effusion (noninflammatory): Secondary | ICD-10-CM

## 2023-12-08 DIAGNOSIS — E1122 Type 2 diabetes mellitus with diabetic chronic kidney disease: Secondary | ICD-10-CM | POA: Diagnosis present

## 2023-12-08 DIAGNOSIS — F0394 Unspecified dementia, unspecified severity, with anxiety: Secondary | ICD-10-CM | POA: Diagnosis present

## 2023-12-08 DIAGNOSIS — E8809 Other disorders of plasma-protein metabolism, not elsewhere classified: Secondary | ICD-10-CM | POA: Diagnosis present

## 2023-12-08 DIAGNOSIS — E876 Hypokalemia: Secondary | ICD-10-CM

## 2023-12-08 DIAGNOSIS — I69354 Hemiplegia and hemiparesis following cerebral infarction affecting left non-dominant side: Secondary | ICD-10-CM | POA: Diagnosis not present

## 2023-12-08 DIAGNOSIS — Z992 Dependence on renal dialysis: Secondary | ICD-10-CM

## 2023-12-08 DIAGNOSIS — N2581 Secondary hyperparathyroidism of renal origin: Secondary | ICD-10-CM | POA: Diagnosis present

## 2023-12-08 DIAGNOSIS — E039 Hypothyroidism, unspecified: Secondary | ICD-10-CM | POA: Diagnosis present

## 2023-12-08 DIAGNOSIS — G9340 Encephalopathy, unspecified: Secondary | ICD-10-CM | POA: Diagnosis not present

## 2023-12-08 DIAGNOSIS — J189 Pneumonia, unspecified organism: Secondary | ICD-10-CM | POA: Diagnosis present

## 2023-12-08 DIAGNOSIS — A419 Sepsis, unspecified organism: Secondary | ICD-10-CM | POA: Diagnosis present

## 2023-12-08 DIAGNOSIS — Z794 Long term (current) use of insulin: Secondary | ICD-10-CM | POA: Diagnosis not present

## 2023-12-08 DIAGNOSIS — G9341 Metabolic encephalopathy: Secondary | ICD-10-CM | POA: Diagnosis present

## 2023-12-08 DIAGNOSIS — R6521 Severe sepsis with septic shock: Secondary | ICD-10-CM | POA: Diagnosis present

## 2023-12-08 DIAGNOSIS — D631 Anemia in chronic kidney disease: Secondary | ICD-10-CM

## 2023-12-08 DIAGNOSIS — E66811 Obesity, class 1: Secondary | ICD-10-CM | POA: Diagnosis present

## 2023-12-08 DIAGNOSIS — I2699 Other pulmonary embolism without acute cor pulmonale: Secondary | ICD-10-CM | POA: Diagnosis present

## 2023-12-08 DIAGNOSIS — R627 Adult failure to thrive: Secondary | ICD-10-CM

## 2023-12-08 DIAGNOSIS — E11649 Type 2 diabetes mellitus with hypoglycemia without coma: Secondary | ICD-10-CM

## 2023-12-08 DIAGNOSIS — J9601 Acute respiratory failure with hypoxia: Secondary | ICD-10-CM | POA: Diagnosis present

## 2023-12-08 DIAGNOSIS — N390 Urinary tract infection, site not specified: Secondary | ICD-10-CM | POA: Diagnosis present

## 2023-12-08 DIAGNOSIS — N186 End stage renal disease: Secondary | ICD-10-CM | POA: Diagnosis present

## 2023-12-08 LAB — URINALYSIS, W/ REFLEX TO CULTURE (INFECTION SUSPECTED)
Bilirubin Urine: NEGATIVE
Glucose, UA: NEGATIVE mg/dL
Ketones, ur: NEGATIVE mg/dL
Nitrite: NEGATIVE
Protein, ur: >=300 mg/dL — AB
Specific Gravity, Urine: 1.015 (ref 1.005–1.030)
pH: 7 (ref 5.0–8.0)

## 2023-12-08 LAB — ECHOCARDIOGRAM LIMITED
Height: 67 in
Weight: 3202.84 [oz_av]

## 2023-12-08 LAB — BASIC METABOLIC PANEL WITH GFR
Anion gap: 18 — ABNORMAL HIGH (ref 5–15)
BUN: 34 mg/dL — ABNORMAL HIGH (ref 8–23)
CO2: 23 mmol/L (ref 22–32)
Calcium: 8.7 mg/dL — ABNORMAL LOW (ref 8.9–10.3)
Chloride: 97 mmol/L — ABNORMAL LOW (ref 98–111)
Creatinine, Ser: 5.83 mg/dL — ABNORMAL HIGH (ref 0.44–1.00)
GFR, Estimated: 7 mL/min — ABNORMAL LOW
Glucose, Bld: 145 mg/dL — ABNORMAL HIGH (ref 70–99)
Potassium: 3.7 mmol/L (ref 3.5–5.1)
Sodium: 138 mmol/L (ref 135–145)

## 2023-12-08 LAB — GLUCOSE, CAPILLARY
Glucose-Capillary: 149 mg/dL — ABNORMAL HIGH (ref 70–99)
Glucose-Capillary: 141 mg/dL — ABNORMAL HIGH (ref 70–99)
Glucose-Capillary: 99 mg/dL (ref 70–99)
Glucose-Capillary: 173 mg/dL — ABNORMAL HIGH (ref 70–99)
Glucose-Capillary: 103 mg/dL — ABNORMAL HIGH (ref 70–99)
Glucose-Capillary: 155 mg/dL — ABNORMAL HIGH (ref 70–99)
Glucose-Capillary: 99 mg/dL (ref 70–99)

## 2023-12-08 LAB — MRSA NEXT GEN BY PCR, NASAL: MRSA by PCR Next Gen: NOT DETECTED

## 2023-12-08 LAB — CBC
HCT: 25.1 % — ABNORMAL LOW (ref 36.0–46.0)
Hemoglobin: 7.9 g/dL — ABNORMAL LOW (ref 12.0–15.0)
MCH: 30.9 pg (ref 26.0–34.0)
MCHC: 31.5 g/dL (ref 30.0–36.0)
MCV: 98 fL (ref 80.0–100.0)
Platelets: 352 10*3/uL (ref 150–400)
RBC: 2.56 MIL/uL — ABNORMAL LOW (ref 3.87–5.11)
RDW: 15.9 % — ABNORMAL HIGH (ref 11.5–15.5)
WBC: 15.4 10*3/uL — ABNORMAL HIGH (ref 4.0–10.5)
nRBC: 0 % (ref 0.0–0.2)

## 2023-12-08 LAB — CORTISOL: Cortisol, Plasma: 10.5 ug/dL

## 2023-12-08 LAB — MAGNESIUM: Magnesium: 1.8 mg/dL (ref 1.7–2.4)

## 2023-12-08 MED ORDER — APIXABAN 5 MG PO TABS
5.0000 mg | ORAL_TABLET | Freq: Two times a day (BID) | ORAL | Status: DC
  Administered 2023-12-08 – 2023-12-12 (×9): 5 mg via ORAL
  Filled 2023-12-08 (×9): qty 1

## 2023-12-08 MED ORDER — LEVOTHYROXINE SODIUM 75 MCG PO TABS
175.0000 ug | ORAL_TABLET | Freq: Every day | ORAL | Status: DC
  Administered 2023-12-08 – 2023-12-12 (×5): 175 ug via ORAL
  Filled 2023-12-08 (×4): qty 1
  Filled 2023-12-08: qty 3
  Filled 2023-12-08: qty 1
  Filled 2023-12-08: qty 3
  Filled 2023-12-08: qty 1

## 2023-12-08 MED ORDER — CHLORHEXIDINE GLUCONATE CLOTH 2 % EX PADS
6.0000 | MEDICATED_PAD | Freq: Every day | CUTANEOUS | Status: DC
  Administered 2023-12-08 – 2023-12-12 (×4): 6 via TOPICAL

## 2023-12-08 MED ORDER — MEMANTINE HCL 10 MG PO TABS
10.0000 mg | ORAL_TABLET | Freq: Two times a day (BID) | ORAL | Status: DC
  Administered 2023-12-08 – 2023-12-12 (×9): 10 mg via ORAL
  Filled 2023-12-08 (×9): qty 1

## 2023-12-08 MED ORDER — SODIUM CHLORIDE 0.9 % IV SOLN
1.0000 g | Freq: Every day | INTRAVENOUS | Status: AC
  Administered 2023-12-08 – 2023-12-11 (×4): 1 g via INTRAVENOUS
  Filled 2023-12-08 (×6): qty 10

## 2023-12-08 MED ORDER — SEVELAMER CARBONATE 0.8 G PO PACK
0.8000 g | PACK | Freq: Three times a day (TID) | ORAL | Status: DC
  Administered 2023-12-08 – 2023-12-12 (×15): 0.8 g via ORAL
  Filled 2023-12-08 (×16): qty 1

## 2023-12-08 MED ORDER — ALBUMIN HUMAN 25 % IV SOLN
25.0000 g | Freq: Once | INTRAVENOUS | Status: AC
  Administered 2023-12-08: 12.5 g via INTRAVENOUS
  Filled 2023-12-08: qty 100

## 2023-12-08 MED ORDER — MIDODRINE HCL 5 MG PO TABS
10.0000 mg | ORAL_TABLET | Freq: Three times a day (TID) | ORAL | Status: DC
  Administered 2023-12-08 – 2023-12-12 (×16): 10 mg via ORAL
  Filled 2023-12-08 (×16): qty 2

## 2023-12-08 MED ORDER — BUPROPION HCL ER (SR) 150 MG PO TB12
150.0000 mg | ORAL_TABLET | Freq: Two times a day (BID) | ORAL | Status: DC
  Administered 2023-12-08 – 2023-12-12 (×10): 150 mg via ORAL
  Filled 2023-12-08 (×11): qty 1

## 2023-12-08 MED ORDER — POLYETHYLENE GLYCOL 3350 17 G PO PACK: 17.0000 g | PACK | Freq: Every day | ORAL | Status: DC | PRN

## 2023-12-08 MED ORDER — INSULIN ASPART 100 UNIT/ML IJ SOLN: 0.0000 [IU] | INTRAMUSCULAR | Status: DC

## 2023-12-08 MED ORDER — CHLORHEXIDINE GLUCONATE CLOTH 2 % EX PADS
6.0000 | MEDICATED_PAD | Freq: Every day | CUTANEOUS | Status: DC
  Administered 2023-12-09 – 2023-12-10 (×2): 6 via TOPICAL

## 2023-12-08 MED ORDER — DOCUSATE SODIUM 100 MG PO CAPS: 100.0000 mg | ORAL_CAPSULE | Freq: Two times a day (BID) | ORAL | Status: DC | PRN

## 2023-12-08 MED ORDER — ORAL CARE MOUTH RINSE: 15.0000 mL | OROMUCOSAL | Status: DC | PRN

## 2023-12-08 NOTE — Consult Note (Signed)
 Renal Service Consult Note [facility]  Jennifer [name] 12/08/2023 Jennifer JONETTA Fret, MD Requesting Physician: Dr. Annella  Reason for Consult: ESRD pt w/ AMS, hypoglycemia HPI: The patient is a 68 y.o. year-old w/ PMH as below who was recently hospitalized with pneumonia and acute PE. Pt presented to ED yesterday afternoon after becoming unresponsive while doing therapy at a rehab unit.   Patient was found to be hypoglycemic and hypoxic.  She only got partial dialysis recently due to to confusion and delirium causing her to pull out her needles.  Patient was seen and admitted by CCM to the ICU.    Pt seen in ICU.  She is lethargic but awakens to voice.  She cannot answer all questions.   ROS -N/A   Past Medical History  Past Medical History:  Diagnosis Date   CAD (coronary artery disease)    cath 5/23 100% dist RCA lesion treated with 2 overlapping Integrity Resolute DES ( 2.25x66mm, 2.25x48mm), 60% mid RCA treated medically, 99% OM2 not amenable to PCI, 70% D1 lesion, EF normal   Hypercholesteremia    Hypertension    Hypothyroidism    Kidney disease    Renal disorder    Stroke (HCC) 10/2018   Thyroid  disease    Type 2 diabetes mellitus (HCC) 10/08/2018   Past Surgical History  Past Surgical History:  Procedure Laterality Date   ABDOMINAL HYSTERECTOMY     AV FISTULA PLACEMENT Right 06/28/2022   Procedure: RIGHT ARM ARTERIOVENOUS (AV) FISTULA CREATION;  Surgeon: Oris Krystal FALCON, MD;  Location: [facility];  Service: Vascular;  Laterality: Right;   CARDIAC CATHETERIZATION N/A 11/03/2015   Procedure: Left Heart Cath and Coronary Angiography;  Surgeon: Alm LELON Clay, MD;  Location: [facility] INVASIVE CV LAB;  Service: Cardiovascular;  Laterality: N/A;   CARDIAC CATHETERIZATION N/A 11/03/2015   Procedure: Coronary Stent Intervention;  Surgeon: Alm LELON Clay, MD;  Location: [facility] INVASIVE CV LAB;  Service: Cardiovascular;  Laterality: N/A;   CARPAL TUNNEL RELEASE     CORONARY  ANGIOPLASTY     1 stent   FISTULA SUPERFICIALIZATION Right 08/16/2022   Procedure: RIGHT ARTERIOVENOUS FISTULA SUPERFICIALIZATION;  Surgeon: Oris Krystal FALCON, MD;  Location: [facility];  Service: Vascular;  Laterality: Right;   TOTAL KNEE ARTHROPLASTY Right 04/02/2019   Procedure: TOTAL KNEE ARTHROPLASTY;  Surgeon: Margrette Taft BRAVO, MD;  Location: [facility];  Service: Orthopedics;  Laterality: Right;   Family History  Family History  Problem Relation Age of Onset   Heart disease Mother    Stroke Sister    Heart attack Brother    Breast cancer Neg Hx    Social History  reports that she quit smoking about 2 years ago. Her smoking use included cigarettes. She has never used smokeless tobacco. She reports that she does not drink alcohol  and does not use drugs. Allergies  Allergies  Allergen Reactions   Glucophage [Metformin] Diarrhea   Tnkase  [Tenecteplase ] Other (See Comments)    Unknown reaction   Home medications Prior to Admission medications   Medication Sig Start Date End Date Taking? Authorizing Provider  apixaban  (ELIQUIS ) 5 MG TABS tablet Take 2 tablets (10 mg total) by mouth 2 (two) times daily for 1 day, THEN 1 tablet (5 mg total) 2 (two) times daily. Patient taking differently: Take 5 mg by mouth twice daily 12/04/23 02/03/24 Yes Singh, Prashant K, MD  aspirin  81 MG chewable tablet Chew 1 tablet (81 mg total) by mouth daily. 05/24/23  Yes de  Clint Kill, Cortney E, NP  atorvastatin  (LIPITOR ) 80 MG tablet TAKE 1 TABLET BY MOUTH ONCE DAILY AT  6  IN  THE  EVENING Patient taking differently: Take 80 mg by mouth every evening. 09/06/23  Yes Mallipeddi, Vishnu P, MD  bisacodyl  (DULCOLAX) 10 MG suppository Place 10 mg rectally daily as needed for mild constipation or moderate constipation.   Yes [provider]  buPROPion  (WELLBUTRIN  SR) 150 MG 12 hr tablet Take 1 tablet by mouth twice daily 08/02/23  Yes [name], Laine LABOR, MD  carvedilol  (COREG ) 12.5 MG tablet Take 1 tablet (12.5 mg total)  by mouth daily. On non-dialysis days Patient taking differently: Take 12.5 mg by mouth See admin instructions. Take 12.5 mg by mouth once daily on Tuesday, Thursday, Saturday, and Sunday 05/24/23  Yes de Clint Kill, Cortney E, NP  DULoxetine  (CYMBALTA ) 60 MG capsule Take 1 capsule by mouth once daily 07/19/23  Yes [name], Laine LABOR, MD  ezetimibe  (ZETIA ) 10 MG tablet Take 1 tablet (10 mg total) by mouth daily. Patient taking differently: Take 10 mg by mouth every evening. 12/04/23  Yes Singh, Prashant K, MD  ferrous sulfate  325 (65 FE) MG tablet Take 325 mg by mouth daily as needed (low iron at dialysis).   Yes [provider]  insulin  aspart (NOVOLOG ) 100 UNIT/ML FlexPen Before each meal 3 times a day, 140-199 - 2 units, 200-250 - 4 units, 251-299 - 6 units,  300-349 - 8 units,  350 or above 10 units. Patient taking differently: Inject 0-10 Units into the skin in the morning, at noon, and at bedtime. BS 0-139 inject 0 units  BS 149-199 inject 2 units  200-250 inject 4 units  251-299 inject 6 units  300-349 inject 8 units  350-999 inject 10 units and call MD 12/04/23  Yes Singh, Prashant K, MD  insulin  lispro protamine-lispro (HUMALOG  75/25 MIX) (75-25) 100 UNIT/ML SUSP injection Inject 0.06 mLs (6 Units total) into the skin 2 (two) times daily with a meal. Inject 25 units with breakfast, 10 units with lunch, 25 units with supper Patient taking differently: Inject 10-25 Units into the skin See admin instructions.  Inject 25 units in the morning and 25 units at bedtime. Inject 10 units in the afternoon 12/04/23  Yes Singh, Prashant K, MD  ipratropium-albuterol  (DUONEB) 0.5-2.5 (3) MG/3ML SOLN Take 3 mLs by nebulization once.   Yes [provider]  iron sucrose 100 mg in sodium chloride  0.9 % 100 mL Inject 100 mg into the vein as needed (iron-deficiency anemia). Venofer   Yes [provider]  levothyroxine  (SYNTHROID ) 175 MCG tablet Take 175 mcg by mouth daily before breakfast.   Yes  [provider]  memantine  (NAMENDA ) 10 MG tablet Take 1 tablet (10 mg total) by mouth 2 (two) times daily. 06/22/23  Yes Sethi, Pramod S, MD  Methoxy PEG-Epoetin Beta (MIRCERA) 200 MCG/0.3ML SOSY 200 mcg by Implant route daily as needed (low RBC at dialysis). Mircera   Yes [provider]  nitroGLYCERIN  (NITROSTAT ) 0.4 MG SL tablet Place 0.4 mg under the tongue every 5 (five) minutes x 3 doses as needed for chest pain.   Yes [provider]  Nutritional Supplements (FEEDING SUPPLEMENT, NEPRO CARB STEADY,) LIQD Take 237 mLs by mouth as needed (missed meal during dialysis.). Patient taking differently: Take 237 mLs by mouth daily as needed (for missed meals). 11/25/23  Yes Shah, Pratik D, DO  OVER THE COUNTER MEDICATION Apply 1 application  topically See admin  instructions. Zinc barrier cream- apply to buttocks with each incontinence episode.   Yes [provider]  sevelamer  carbonate (RENVELA ) 0.8 g PACK packet Take 0.8 g by mouth 3 (three) times daily with meals.   Yes [provider]  Sodium Phosphates (ENEMA) ENEM Place 1 enema rectally daily as needed (no relief from bisacodyl  suppository).   Yes [provider]  tobramycin  (TOBREX ) 0.3 % ophthalmic solution Place 1 drop into the right eye every 2 (two) hours.   Yes [provider]  furosemide  (LASIX ) 80 MG tablet Take 80 mg by mouth every Monday, Wednesday, and Friday. Give on dialysis days : Monday, Wednesday, Friday Patient not taking: Reported on 12/08/2023 10/27/22   [provider]  lidocaine -prilocaine  (EMLA ) cream Apply 1 Application topically every Monday, Wednesday, and Friday. Apply on dialysis days : Monday, Wednesday, Friday Patient not taking: Reported on 12/08/2023    [provider]     Vitals:   12/08/23 1530 12/08/23 1545 12/08/23 1600 12/08/23 1615  BP: (!) 111/57 (!) 117/40 (!) 104/36 (!) 100/58  Pulse: 75 75 73 74  Resp: 15 14 18 14   Temp:       TempSrc:      SpO2: 95% 94% 95% 95%  Weight:      Height:       Exam Gen Obese, elderly lady lethargic, awakens to voice but then falls back asleep No rash, cyanosis or gangrene Sclera anicteric, throat clear  No jvd or bruits Chest clear bilat to bases, no rales/ wheezing RRR no MRG Abd soft ntnd no mass or ascites +bs GU deferred MS no joint effusions or deformity Ext mild 1+ hip edema bilaterally, no PT edema  Neuro is as above    RUA AVF + bruit  CXR 6/26 - no active disease   OP HD: [facility] 4h   B400   90kg   2K   AVF   Heparin  none Last OP HD 6/25, post wt 91.2kg   Prior to that possibly was on HHD    Assessment/ Plan: Acute metabolic encephalopathy: bun / creat don't suggest uremia. Otherwise per CCM.  ESRD: on HD MWF. Had HD on 6/25 Wed. Labs and vol stable today. Will plan next HD tomorrow.  Hypotension: on low dose levo gtt, follow Volume: min hip edema, CXR clear, at dry wt. Min UF w/ HD tomorrow.  Anemia of esrd: Hb 8- 9 here, follow.      Myer Fret  MD [facility] 12/08/2023, 4:52 PM  Recent Labs  Lab 12/04/23 1633 12/07/23 1616 12/07/23 1700 12/07/23 1810 12/08/23 0405  HGB 8.2*   < > 8.3*  --  7.9*  ALBUMIN  2.4*  --   --  2.0*  --   CALCIUM  8.8*  --   --  7.0* 8.7*  PHOS 7.6*  --   --   --   --   CREATININE 7.20*  --   --  4.72* 5.83*  K 4.5   < >  --  3.4* 3.7   < > = values in this interval not displayed.   Inpatient medications:  apixaban   5 mg Oral BID   buPROPion   150 mg Oral BID WC   Chlorhexidine  Gluconate Cloth  6 each Topical Daily   levothyroxine   175 mcg Oral Q0600   memantine   10 mg Oral BID   midodrine  10 mg Oral TID WC   sevelamer  carbonate  0.8 g Oral TID WC    ceFEPime  (  MAXIPIME ) IV Stopped (12/08/23 1025)   norepinephrine  (LEVOPHED ) Adult infusion Stopped (12/08/23 1534)   docusate sodium , mouth rinse, polyethylene glycol

## 2023-12-08 NOTE — Progress Notes (Signed)
 eLink Physician-Brief Progress Note Patient Name: Jennifer [name] DOB: 04-12-1956 MRN: [id number]   Date of Service  12/08/2023  HPI/Events of Note  Patient with ESRD-DD and recent CVA admitted with altered mental status, hypothermia, hypoglycemia, and hypotension. Work up is in progress.  eICU Interventions  New Patient Evaluation.        Dali Kraner U Harlean Regula 12/08/2023, 2:19 AM

## 2023-12-08 NOTE — Progress Notes (Signed)
 Echocardiogram 2D Echocardiogram has been performed.  Jennifer [name] [name] RDCS 12/08/2023, 9:00 AM

## 2023-12-08 NOTE — H&P (Addendum)
 NAME:  Jennifer [name], MRN:  [id number], DOB:  [birth year], LOS: 0 ADMISSION DATE:  12/07/2023, CONSULTATION DATE:  12/08/23 REFERRING MD:  EDP, CHIEF COMPLAINT:  ams   History of Present Illness:  68 yo female recently discharged from [facility] 6/9-6/12 after acute cva and subsequently represented with similar symptoms as this presentation (hypothermia/hypoglycemia/hypoxia and worsening mental status d/c'd 6/23). She was noted to have pneumonia (LLL) and acute pe started on eliquis . She was placed back in rehab. Today she was reportedly in therapy and became unresponsive. She was hypoxic to 77 on RA and hypoglycemic to 40's. Per daughter pt has been participating in her usual dialysis but most recently had only partial session as pt has become more confused with memory loss and delirium that she pulled out her needles.    She has not been eating or drinking well per the family at bedside and does have low albumin  on lab analysis. She was previously on midodrine at baseline but daughter states was taken off as pt was quite responsive to it and became hypertensive with it.  TSH within normal range. Hgb stable. CT chest with small pericardial effusion, no acute pulmonary changes, still with LLL pneumonia. Lactate stable. No elevated wbc but is hypothermic 95.6, despite ~1L of fluid pt remained hypotensive and was started on levo. BS has improved with amp of d50. Pt denies any complaints with exception of being tired.   Ccm was asked to admit for vasopressor titration.   D/w was had with family about cvc placement. At this time we will defer this as anticipate improvement with resuming midodrine  Pertinent  Medical History  [facility]rd mwf H/o cva with L sided deficits H/o pe on chronic a/c T2dm Hypothyroidism Anxiety Depression dementia   Significant Hospital Events: Including procedures, antibiotic start and stop dates in addition to other pertinent events   Admitted to ICU 6/27  Interim History /  Subjective:    Objective    Blood pressure (!) 102/59, pulse 92, temperature 99.9 F (37.7 C), temperature source Temporal, resp. rate 17, height 5' 7 (1.702 m), weight 90.7 kg, SpO2 94%.        Intake/Output Summary (Last 24 hours) at 12/08/2023 0041 Last data filed at 12/07/2023 2150 Gross per 24 hour  Intake 108.66 ml  Output --  Net 108.66 ml   Filed Weights   12/07/23 1623  Weight: 90.7 kg    Examination: General: chronically ill appearing, wrapped in blankets nad, arousable HENT: ncat, R eye with lens in place and larger pupil, unreactive and L pupil reactive, mm dry and pale Lungs: diminished effort but no rales/rhonchi/wheezes Cardiovascular: rrr Abdomen: obese nt/nd bs+ Extremities: no c/c/e L weakness, R shin with reportedly stable hematoma anteriorly, R upper arm fistula in place Neuro: L weakness, slowed speech but arousable GU: deferred  Resolved problem list   Assessment and Plan  Acute metabolic encephalopathy Hypoglycemia Hypothermia Hypotension Pericardial effusion Dementia Failure to thrive hypokalemia Recent cva with L sided weakness Acute pe on chronic a/c T2dm, with hypoglycemia Esrd MWF Anemia of chronic disease Goals of care -at this time admit to ICU to titrate vasopressors to sbp >90 -start midodrine -no overt signs of infection and was dosed with abx in ed -will send UA for completeness sake -cont cefepime  for now empirically as recent uti was sensitive -give 25gm albumin  -limited echo pending with new finding of pericardial effusion -check cortisol for potential adrenal insuff and start steroid  -with pt's failure to thrive  decreased PO intake and recent cyclical presentation to hospital I believe that consultation with palliative care at this time with ongoing discussions about goals of care is appropriate.  -low dose sliding scale insulin   -cont home eliquis  -consult nephrology in am  Best Practice (right click and Reselect  all SmartList Selections daily)   Diet/type: NPO w/ oral meds DVT prophylaxis DOAC Pressure ulcer(s): present on admission  GI prophylaxis: N/A Lines: N/A Foley:  N/A Code Status:  full code Last date of multidisciplinary goals of care discussion [pending]  Labs   CBC: Recent Labs  Lab 12/01/23 0731 12/04/23 1633 12/07/23 1616 12/07/23 1700  WBC 6.6 8.2  --  7.0  NEUTROABS  --   --   --  5.5  HGB 8.2* 8.2* 8.2* 8.3*  HCT 27.0* 26.3* 24.0* 26.9*  MCV 99.3 99.2  --  100.0  PLT 284 325  --  358    Basic Metabolic Panel: Recent Labs  Lab 12/01/23 0731 12/04/23 1633 12/07/23 1616 12/07/23 1810  NA 135 137 138 139  K 4.2 4.5 4.1 3.4*  CL 96* 97*  --  110  CO2 26 24  --  25  GLUCOSE 154* 130*  --  102*  BUN 46* 58*  --  29*  CREATININE 5.89* 7.20*  --  4.72*  CALCIUM  8.6* 8.8*  --  7.0*  PHOS 5.0* 7.6*  --   --    GFR: Estimated Creatinine Clearance: 13.2 mL/min (A) (by C-G formula based on SCr of 4.72 mg/dL (H)). Recent Labs  Lab 12/01/23 0731 12/04/23 1633 12/07/23 1700 12/07/23 1714  WBC 6.6 8.2 7.0  --   LATICACIDVEN  --   --   --  0.7    Liver Function Tests: Recent Labs  Lab 12/01/23 0731 12/04/23 1633 12/07/23 1810  AST  --   --  17  ALT  --   --  15  ALKPHOS  --   --  41  BILITOT  --   --  0.8  PROT  --   --  4.6*  ALBUMIN  2.3* 2.4* 2.0*   No results for input(s): LIPASE, AMYLASE in the last 168 hours. No results for input(s): AMMONIA in the last 168 hours.  ABG    Component Value Date/Time   PHART 7.452 (H) 12/07/2023 1616   PCO2ART 45.2 12/07/2023 1616   PO2ART 78 (L) 12/07/2023 1616   HCO3 32.0 (H) 12/07/2023 1616   TCO2 33 (H) 12/07/2023 1616   ACIDBASEDEF 2.5 (H) 03/22/2022 1657   O2SAT 97 12/07/2023 1616     Coagulation Profile: Recent Labs  Lab 12/07/23 1806  INR 1.8*    Cardiac Enzymes: No results for input(s): CKTOTAL, CKMB, CKMBINDEX, TROPONINI in the last 168 hours.  HbA1C: Hemoglobin A1C   Date/Time Value Ref Range Status  03/20/2017 12:00 AM 7.0  Final   Hgb A1c MFr Bld  Date/Time Value Ref Range Status  11/29/2023 02:10 PM 5.7 (H) 4.8 - 5.6 % Final    Comment:    (NOTE) Diagnosis of Diabetes The following HbA1c ranges recommended by the American Diabetes Association (ADA) may be used as an aid in the diagnosis of diabetes mellitus.  Hemoglobin             Suggested A1C NGSP%              Diagnosis  <5.7                   Non  Diabetic  5.7-6.4                Pre-Diabetic  >6.4                   Diabetic  <7.0                   Glycemic control for                       adults with diabetes.    11/20/2023 09:41 PM 6.3 (H) 4.8 - 5.6 % Final    Comment:    (NOTE)         Prediabetes: 5.7 - 6.4         Diabetes: >6.4         Glycemic control for adults with diabetes: <7.0     CBG: Recent Labs  Lab 12/07/23 1611 12/07/23 1641 12/07/23 1849 12/07/23 2053 12/07/23 2153  GLUCAP 41* 107* 82 53* 116*    Review of Systems:   As per HPI  Past Medical History:  She,  has a past medical history of CAD (coronary artery disease), Hypercholesteremia, Hypertension, Hypothyroidism, Kidney disease, Renal disorder, Stroke (HCC) (10/2018), Thyroid  disease, and Type 2 diabetes mellitus (HCC) (10/08/2018).   Surgical History:   Past Surgical History:  Procedure Laterality Date   ABDOMINAL HYSTERECTOMY     AV FISTULA PLACEMENT Right 06/28/2022   Procedure: RIGHT ARM ARTERIOVENOUS (AV) FISTULA CREATION;  Surgeon: Oris Krystal FALCON, MD;  Location: [facility];  Service: Vascular;  Laterality: Right;   CARDIAC CATHETERIZATION N/A 11/03/2015   Procedure: Left Heart Cath and Coronary Angiography;  Surgeon: Alm LELON Clay, MD;  Location[facility] INVASIVE CV LAB;  Service: Cardiovascular;  Laterality: N/A;   CARDIAC CATHETERIZATION N/A 11/03/2015   Procedure: Coronary Stent Intervention;  Surgeon: Alm LELON Clay, MD;  Location: [facility] INVASIVE CV LAB;  Service: Cardiovascular;   Laterality: N/A;   CARPAL TUNNEL RELEASE     CORONARY ANGIOPLASTY     1 stent   FISTULA SUPERFICIALIZATION Right 08/16/2022   Procedure: RIGHT ARTERIOVENOUS FISTULA SUPERFICIALIZATION;  Surgeon: Oris Krystal FALCON, MD;  Location: [facility];  Service: Vascular;  Laterality: Right;   TOTAL KNEE ARTHROPLASTY Right 04/02/2019   Procedure: TOTAL KNEE ARTHROPLASTY;  Surgeon: Margrette Taft BRAVO, MD;  Location: [facility];  Service: Orthopedics;  Laterality: Right;     Social History:   reports that she quit smoking about 2 years ago. Her smoking use included cigarettes. She has never used smokeless tobacco. She reports that she does not drink alcohol  and does not use drugs.   Family History:  Her family history includes Heart attack in her brother; Heart disease in her mother; Stroke in her sister. There is no history of Breast cancer.   Allergies Allergies  Allergen Reactions   Glucophage [Metformin] Diarrhea   Tnkase  [Tenecteplase ] Other (See Comments)    Unknown reaction     Home Medications  Prior to Admission medications   Medication Sig Start Date End Date Taking? Authorizing Provider  apixaban  (ELIQUIS ) 5 MG TABS tablet Take 2 tablets (10 mg total) by mouth 2 (two) times daily for 1 day, THEN 1 tablet (5 mg total) 2 (two) times daily. 12/04/23 02/03/24  Singh, Prashant K, MD  aspirin  81 MG chewable tablet Chew 1 tablet (81 mg total) by mouth daily. 05/24/23   de Clint Kill, Cortney E, NP  atorvastatin  (LIPITOR ) 80 MG tablet TAKE 1 TABLET BY MOUTH  ONCE DAILY AT  6  IN  THE  EVENING 09/06/23   Mallipeddi, Vishnu P, MD  bisacodyl  (DULCOLAX) 10 MG suppository Place 10 mg rectally daily as needed (no relief from Milk of Magnesia).    [provider]  buPROPion  (WELLBUTRIN  SR) 150 MG 12 hr tablet Take 1 tablet by mouth twice daily 08/02/23   [name], Laine LABOR, MD  carvedilol  (COREG ) 12.5 MG tablet Take 1 tablet (12.5 mg total) by mouth daily. On non-dialysis days Patient taking differently: Take 12.5  mg by mouth every Monday, Wednesday, and Friday. Give on dialysis days : Monday, Wednesday, Friday 05/24/23   de Clint Kill, Earle E, NP  DULoxetine  (CYMBALTA ) 60 MG capsule Take 1 capsule by mouth once daily 07/19/23   [name], Laine LABOR, MD  ezetimibe  (ZETIA ) 10 MG tablet Take 1 tablet (10 mg total) by mouth daily. 12/04/23   Dennise Lavada POUR, MD  ferrous sulfate  325 (65 FE) MG tablet Take 325 mg by mouth daily as needed (low iron at dialysis).    [provider]  furosemide  (LASIX ) 80 MG tablet Take 80 mg by mouth every Monday, Wednesday, and Friday. Give on dialysis days : Monday, Wednesday, Friday 10/27/22   [provider]  insulin  aspart (NOVOLOG ) 100 UNIT/ML FlexPen Before each meal 3 times a day, 140-199 - 2 units, 200-250 - 4 units, 251-299 - 6 units,  300-349 - 8 units,  350 or above 10 units. 12/04/23   Singh, Prashant K, MD  insulin  lispro protamine-lispro (HUMALOG  75/25 MIX) (75-25) 100 UNIT/ML SUSP injection Inject 0.06 mLs (6 Units total) into the skin 2 (two) times daily with a meal. Inject 25 units with breakfast, 10 units with lunch, 25 units with supper 12/04/23   Singh, Prashant K, MD  iron sucrose 100 mg in sodium chloride  0.9 % 100 mL Inject 100 mg into the vein as needed (iron-deficiency anemia). Venofer    [provider]  levothyroxine  (SYNTHROID ) 175 MCG tablet Take 175 mcg by mouth daily before breakfast.    [provider]  lidocaine -prilocaine  (EMLA ) cream Apply 1 Application topically every Monday, Wednesday, and Friday. Apply on dialysis days : Monday, Wednesday, Friday    [provider]  Magnesium  Hydroxide (MILK OF MAGNESIA PO) Take 30 mLs by mouth daily as needed (no BM in 3 days).    [provider]  memantine  (NAMENDA ) 10 MG tablet Take 1 tablet (10 mg total) by mouth 2 (two) times daily. 06/22/23   Sethi, Pramod S, MD  Methoxy PEG-Epoetin Beta (MIRCERA) 200 MCG/0.3ML SOSY 200 mcg by Implant route daily as needed (low RBC at  dialysis). Mircera    [provider]  nitroGLYCERIN  (NITROSTAT ) 0.4 MG SL tablet Place 0.4 mg under the tongue every 5 (five) minutes x 3 doses as needed for chest pain.    [provider]  Nutritional Supplements (FEEDING SUPPLEMENT, NEPRO CARB STEADY,) LIQD Take 237 mLs by mouth as needed (missed meal during dialysis.). Patient taking differently: Take 237 mLs by mouth every Monday, Wednesday, and Friday. Give on dialysis days : Monday, Wednesday, Friday - for missed meal 11/25/23   Maree, Pratik D, DO  sevelamer  carbonate (RENVELA ) 0.8 g PACK packet Take 0.8 g by mouth 3 (three) times daily with meals.    [provider]  Sodium Phosphates (ENEMA) ENEM Place 1 enema rectally daily as needed (no relief from bisacodyl  suppository).    [provider]  tobramycin  (TOBREX ) 0.3 % ophthalmic solution Place 1  drop into the right eye every 2 (two) hours.    [provider]     Critical care time: 

## 2023-12-09 LAB — BASIC METABOLIC PANEL WITH GFR
Anion gap: 11 (ref 5–15)
BUN: 44 mg/dL — ABNORMAL HIGH (ref 8–23)
CO2: 27 mmol/L (ref 22–32)
Calcium: 8.3 mg/dL — ABNORMAL LOW (ref 8.9–10.3)
Chloride: 97 mmol/L — ABNORMAL LOW (ref 98–111)
Creatinine, Ser: 6.95 mg/dL — ABNORMAL HIGH (ref 0.44–1.00)
GFR, Estimated: 6 mL/min — ABNORMAL LOW (ref >60)
Glucose, Bld: 80 mg/dL (ref 70–99)
Potassium: 3.9 mmol/L (ref 3.5–5.1)
Sodium: 135 mmol/L (ref 135–145)

## 2023-12-09 LAB — GLUCOSE, CAPILLARY
Glucose-Capillary: 77 mg/dL (ref 70–99)
Glucose-Capillary: 84 mg/dL (ref 70–99)
Glucose-Capillary: 92 mg/dL (ref 70–99)
Glucose-Capillary: 96 mg/dL (ref 70–99)
Glucose-Capillary: 88 mg/dL (ref 70–99)
Glucose-Capillary: 130 mg/dL — ABNORMAL HIGH (ref 70–99)

## 2023-12-09 NOTE — H&P (Deleted)
 NAME:  LUMMIE MONTIJO, MRN:  [id number], DOB:  01-26-1956, LOS: 1 ADMISSION DATE:  12/07/2023, CONSULTATION DATE:  12/08/23 REFERRING MD:  EDP, CHIEF COMPLAINT:  ams   History of Present Illness:  68 yo female recently discharged from [facility] 6/9-6/12 after acute cva and subsequently represented with similar symptoms as this presentation (hypothermia/hypoglycemia/hypoxia and worsening mental status d/c'd 6/23). She was noted to have pneumonia (LLL) and acute pe started on eliquis . She was placed back in rehab. Today she was reportedly in therapy and became unresponsive. She was hypoxic to 77 on RA and hypoglycemic to 40's. Per daughter pt has been participating in her usual dialysis but most recently had only partial session as pt has become more confused with memory loss and delirium that she pulled out her needles.    She has not been eating or drinking well per the family at bedside and does have low albumin  on lab analysis. She was previously on midodrine at baseline but daughter states was taken off as pt was quite responsive to it and became hypertensive with it.  TSH within normal range. Hgb stable. CT chest with small pericardial effusion, no acute pulmonary changes, still with LLL pneumonia. Lactate stable. No elevated wbc but is hypothermic 95.6, despite ~1L of fluid pt remained hypotensive and was started on levo. BS has improved with amp of d50. Pt denies any complaints with exception of being tired.   Ccm was asked to admit for vasopressor titration.   D/w was had with family about cvc placement. 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 rate 13, height 5' 7 (1.702 m), weight 90.1 kg, SpO2 93%.        Intake/Output Summary (Last 24 hours) at 12/09/2023 0744 Last data filed at 12/08/2023 2200 Gross per 24 hour  Intake 626.57 ml  Output 100 ml  Net 526.57 ml   Filed Weights   12/07/23 1623 12/08/23 0100 12/09/23 0600  Weight: 90.7 kg 90.8 kg 90.1 kg    Examination: General: chronically ill appearing, arousable HENT: NCAT Lungs: diminished effort but no rales/rhonchi/wheezes Cardiovascular: rrr Abdomen: obese nt/nd bs+ Extremities: no c/c/e L weakness, R shin with reportedly stable hematoma anteriorly, R upper arm fistula in place Neuro: L weakness, slowed speech but arousable, conversant GU: deferred  Resolved problem list   Assessment and Plan   Metabolic encephalopathy: Unclear baseline.  Suspect poor nutrition failure to thrive mild hypotension possible UTI. -- Minimize central acting meds -- Improving overall with hydration and time and treatment of possible infection  Hypotension, presumed septic shock: With source is possible UTI given recent history of the same as well as her report of dysuria (although hard to cooperate given her AMS) -- MAP goal greater than 65, weaned off norepinephrine  6/27 -- Continue midodrine -- Cefepime  plan 5 days, UA with leukocytes, pyuria, although there are some squamous cells so may not be accurate  History of dementia: Unclear if encephalopathy is far from baseline, overall  seems improved -- Continue home Namenda   History of hypothyroidism: -- Continue home Synthroid   History of PE: -- Continue home apixaban   Best Practice (right click and Reselect all SmartList Selections daily)   Diet/type: NPO w/ oral meds SLP c/s DVT prophylaxis DOAC Pressure ulcer(s): present on admission   GI prophylaxis: N/A Lines: N/A Foley:  N/A Code Status:  full code Last date of multidisciplinary goals of care discussion [pending]  Labs   CBC: Recent Labs  Lab 12/04/23 1633 12/07/23 1616 12/07/23 1700 12/08/23 0405  WBC 8.2  --  7.0 15.4*  NEUTROABS  --   --  5.5  --   HGB 8.2* 8.2* 8.3* 7.9*  HCT 26.3* 24.0* 26.9* 25.1*  MCV 99.2  --  100.0 98.0  PLT 325  --  358 352    Basic Metabolic Panel: Recent Labs  Lab 12/04/23 1633 12/07/23 1616 12/07/23 1810 12/08/23 0405 12/09/23 0251  NA 137 138 139 138 135  K 4.5 4.1 3.4* 3.7 3.9  CL 97*  --  110 97* 97*  CO2 24  --  25 23 27   GLUCOSE 130*  --  102* 145* 80  BUN 58*  --  29* 34* 44*  CREATININE 7.20*  --  4.72* 5.83* 6.95*  CALCIUM  8.8*  --  7.0* 8.7* 8.3*  MG  --   --   --  1.8  --   PHOS 7.6*  --   --   --   --    GFR: Estimated Creatinine Clearance: 8.9 mL/min (A) (by C-G formula based on SCr of 6.95 mg/dL (H)). Recent Labs  Lab 12/04/23 1633 12/07/23 1700 12/07/23 1714 12/08/23 0405  WBC 8.2 7.0  --  15.4*  LATICACIDVEN  --   --  0.7  --     Liver Function Tests: Recent Labs  Lab 12/04/23 1633 12/07/23 1810  AST  --  17  ALT  --  15  ALKPHOS  --  41  BILITOT  --  0.8  PROT  --  4.6*  ALBUMIN  2.4* 2.0*   No results for input(s): LIPASE, AMYLASE in the last 168 hours. No results for input(s): AMMONIA in the last 168 hours.  ABG    Component Value Date/Time   PHART 7.452 (H) 12/07/2023 1616   PCO2ART 45.2 12/07/2023 1616   PO2ART 78 (L) 12/07/2023 1616   HCO3 32.0 (H) 12/07/2023 1616   TCO2 33 (H) 12/07/2023 1616   ACIDBASEDEF 2.5 (H) 03/22/2022 1657   O2SAT 97 12/07/2023 1616     Coagulation Profile: Recent Labs  Lab 12/07/23 1806  INR 1.8*    Cardiac Enzymes: No results for input(s): CKTOTAL, CKMB, CKMBINDEX, TROPONINI in the last 168 hours.  HbA1C: Hemoglobin A1C  Date/Time Value Ref Range Status  03/20/2017 12:00 AM 7.0  Final   Hgb A1c MFr Bld   Date/Time Value Ref Range Status  11/29/2023 02:10 PM 5.7 (H) 4.8 - 5.6 % Final    Comment:    (NOTE) Diagnosis of Diabetes The following HbA1c ranges recommended by the American Diabetes Association (ADA) may be used as an aid in the diagnosis of diabetes mellitus.  Hemoglobin             Suggested A1C NGSP%              Diagnosis  <5.7                   Non Diabetic  5.7-6.4  Pre-Diabetic  >6.4                   Diabetic  <7.0                   Glycemic control for                       adults with diabetes.    11/20/2023 09:41 PM 6.3 (H) 4.8 - 5.6 % Final    Comment:    (NOTE)         Prediabetes: 5.7 - 6.4         Diabetes: >6.4         Glycemic control for adults with diabetes: <7.0     CBG: Recent Labs  Lab 12/08/23 1152 12/08/23 1606 12/08/23 1922 12/08/23 2326 12/09/23 0410  GLUCAP 173* 103* 155* 99 77    Review of Systems:   As per HPI  Past Medical History:  She,  has a past medical history of CAD (coronary artery disease), Hypercholesteremia, Hypertension, Hypothyroidism, Kidney disease, Renal disorder, Stroke (HCC) (10/2018), Thyroid  disease, and Type 2 diabetes mellitus (HCC) (10/08/2018).   Surgical History:   Past Surgical History:  Procedure Laterality Date   ABDOMINAL HYSTERECTOMY     AV FISTULA PLACEMENT Right 06/28/2022   Procedure: RIGHT ARM ARTERIOVENOUS (AV) FISTULA CREATION;  Surgeon: Oris Krystal FALCON, MD;  Location: [facility];  Service: Vascular;  Laterality: Right;   CARDIAC CATHETERIZATION N/A 11/03/2015   Procedure: Left Heart Cath and Coronary Angiography;  Surgeon: Alm LELON Clay, MD;  Location: [facility] INVASIVE CV LAB;  Service: Cardiovascular;  Laterality: N/A;   CARDIAC CATHETERIZATION N/A 11/03/2015   Procedure: Coronary Stent Intervention;  Surgeon: Alm LELON Clay, MD;  Location: [facility] INVASIVE CV LAB;  Service: Cardiovascular;  Laterality: N/A;   CARPAL TUNNEL RELEASE     CORONARY ANGIOPLASTY     1 stent   FISTULA  SUPERFICIALIZATION Right 08/16/2022   Procedure: RIGHT ARTERIOVENOUS FISTULA SUPERFICIALIZATION;  Surgeon: Oris Krystal FALCON, MD;  Location: [facility];  Service: Vascular;  Laterality: Right;   TOTAL KNEE ARTHROPLASTY Right 04/02/2019   Procedure: TOTAL KNEE ARTHROPLASTY;  Surgeon: Margrette Taft BRAVO, MD;  Location: [facility];  Service: Orthopedics;  Laterality: Right;     Social History:   reports that she quit smoking about 2 years ago. Her smoking use included cigarettes. She has never used smokeless tobacco. She reports that she does not drink alcohol  and does not use drugs.   Family History:  Her family history includes Heart attack in her brother; Heart disease in her mother; Stroke in her sister. There is no history of Breast cancer.   Allergies Allergies  Allergen Reactions   Glucophage [Metformin] Diarrhea   Tnkase  [Tenecteplase ] Other (See Comments)    Unknown reaction     Home Medications  Prior to Admission medications   Medication Sig Start Date End Date Taking? Authorizing Provider  apixaban  (ELIQUIS ) 5 MG TABS tablet Take 2 tablets (10 mg total) by mouth 2 (two) times daily for 1 day, THEN 1 tablet (5 mg total) 2 (two) times daily. 12/04/23 02/03/24  Singh, Prashant K, MD  aspirin  81 MG chewable tablet Chew 1 tablet (81 mg total) by mouth daily. 05/24/23   de Clint Kill, Cortney E, NP  atorvastatin  (LIPITOR ) 80 MG tablet TAKE 1 TABLET BY MOUTH ONCE DAILY AT  6  IN  THE  EVENING 09/06/23   Mallipeddi, Diannah SQUIBB, MD  bisacodyl  (DULCOLAX) 10 MG suppository Place 10 mg rectally daily as needed (no relief from Milk of Magnesia).    [provider]  buPROPion  (WELLBUTRIN  SR) 150 MG 12 hr tablet Take 1 tablet by mouth twice daily 08/02/23   [name], Laine LABOR, MD  carvedilol  (COREG ) 12.5 MG tablet Take 1 tablet (12.5 mg total) by mouth daily. On non-dialysis days Patient taking differently: Take 12.5 mg by mouth every Monday, Wednesday, and Friday. Give on dialysis days : Monday, Wednesday,  Friday 05/24/23   de Clint Kill, Earle E, NP  DULoxetine  (CYMBALTA ) 60 MG capsule Take 1 capsule by mouth once daily 07/19/23   [name], Laine LABOR, MD  ezetimibe  (ZETIA ) 10 MG tablet Take 1 tablet (10 mg total) by mouth daily. 12/04/23   Dennise Lavada POUR, MD  ferrous sulfate  325 (65 FE) MG tablet Take 325 mg by mouth daily as needed (low iron at dialysis).    [provider]  furosemide  (LASIX ) 80 MG tablet Take 80 mg by mouth every Monday, Wednesday, and Friday. Give on dialysis days : Monday, Wednesday, Friday 10/27/22   [provider]  insulin  aspart (NOVOLOG ) 100 UNIT/ML FlexPen Before each meal 3 times a day, 140-199 - 2 units, 200-250 - 4 units, 251-299 - 6 units,  300-349 - 8 units,  350 or above 10 units. 12/04/23   Singh, Prashant K, MD  insulin  lispro protamine-lispro (HUMALOG  75/25 MIX) (75-25) 100 UNIT/ML SUSP injection Inject 0.06 mLs (6 Units total) into the skin 2 (two) times daily with a meal. Inject 25 units with breakfast, 10 units with lunch, 25 units with supper 12/04/23   Singh, Prashant K, MD  iron sucrose 100 mg in sodium chloride  0.9 % 100 mL Inject 100 mg into the vein as needed (iron-deficiency anemia). Venofer    [provider]  levothyroxine  (SYNTHROID ) 175 MCG tablet Take 175 mcg by mouth daily before breakfast.    [provider]  lidocaine -prilocaine  (EMLA ) cream Apply 1 Application topically every Monday, Wednesday, and Friday. Apply on dialysis days : Monday, Wednesday, Friday    [provider]  Magnesium  Hydroxide (MILK OF MAGNESIA PO) Take 30 mLs by mouth daily as needed (no BM in 3 days).    [provider]  memantine  (NAMENDA ) 10 MG tablet Take 1 tablet (10 mg total) by mouth 2 (two) times daily. 06/22/23   Sethi, Pramod S, MD  Methoxy PEG-Epoetin Beta (MIRCERA) 200 MCG/0.3ML SOSY 200 mcg by Implant route daily as needed (low RBC at dialysis). Mircera    [provider]  nitroGLYCERIN  (NITROSTAT ) 0.4 MG SL tablet  Place 0.4 mg under the tongue every 5 (five) minutes x 3 doses as needed for chest pain.    [provider]  Nutritional Supplements (FEEDING SUPPLEMENT, NEPRO CARB STEADY,) LIQD Take 237 mLs by mouth as needed (missed meal during dialysis.). Patient taking differently: Take 237 mLs by mouth every Monday, Wednesday, and Friday. Give on dialysis days : Monday, Wednesday, Friday - for missed meal 11/25/23   Maree, Pratik D, DO  sevelamer  carbonate (RENVELA ) 0.8 g PACK packet Take 0.8 g by mouth 3 (three) times daily with meals.    [provider]  Sodium Phosphates (ENEMA) ENEM Place 1 enema rectally daily as needed (no relief from bisacodyl  suppository).    [provider]  tobramycin  (TOBREX ) 0.3 % ophthalmic solution Place 1 drop into the right eye every 2 (two) hours.    [provider]  Critical care time:       Donnice JONELLE Beals, MD See TRACEY

## 2023-12-09 NOTE — Progress Notes (Addendum)
 The patient completed the dialysis treatment  without any adverse reaction. UF off 2 L.  12/09/23 1700  Vitals  Temp 97.6 F (36.4 C)  Temp Source Oral  BP (!) 121/47  MAP (mmHg) 71  BP Location Left Arm  BP Method Automatic  Patient Position (if appropriate) Lying  Pulse Rate 74  ECG Heart Rate 75  Resp 14  Oxygen  Therapy  SpO2 99 %  O2 Device Room Air  During Treatment Monitoring  Blood Flow Rate (mL/min) 0 mL/min  Arterial Pressure (mmHg) -49.09 mmHg  Venous Pressure (mmHg) 55.96 mmHg  TMP (mmHg) 10.3 mmHg  Ultrafiltration Rate (mL/min) 830 mL/min  Dialysate Flow Rate (mL/min) 299 ml/min  Duration of HD Treatment -hour(s) 2.99 hour(s)  Cumulative Fluid Removed (mL) per Treatment  1994.66  Intra-Hemodialysis Comments Tx completed  Post Treatment  Dialyzer Clearance Lightly streaked  Hemodialysis Intake (mL) 0 mL  Liters Processed 72  Fluid Removed (mL) 2000 mL  Tolerated HD Treatment Yes  Post-Hemodialysis Comments Pt goal met.  AVG/AVF Arterial Site Held (minutes) 10 minutes  AVG/AVF Venous Site Held (minutes) 10 minutes  Fistula / Graft Right Upper arm Arteriovenous fistula  Placement Date/Time: 06/28/22 0818   Placed prior to admission: No  Orientation: Right  Access Location: Upper arm  Access Type: Arteriovenous fistula  Site Condition No complications  Fistula / Graft Assessment Present;Thrill;Bruit  Status Deaccessed  Needle Size 15  Drainage Description None

## 2023-12-09 NOTE — H&P (Deleted)
 NAME:  Jennifer [name], MRN:  [id number], DOB:  [birth year], LOS: 1 ADMISSION DATE:  12/07/2023, CONSULTATION DATE:  12/08/23 REFERRING MD:  EDP, CHIEF COMPLAINT:  ams   History of Present Illness:  68 yo female recently discharged from [facility] 6/9-6/12 after acute cva and subsequently represented with similar symptoms as this presentation (hypothermia/hypoglycemia/hypoxia and worsening mental status d/c'd 6/23). She was noted to have pneumonia (LLL) and acute pe started on eliquis . She was placed back in rehab. Today she was reportedly in therapy and became unresponsive. She was hypoxic to 77 on RA and hypoglycemic to 40's. Per daughter pt has been participating in her usual dialysis but most recently had only partial session as pt has become more confused with memory loss and delirium that she pulled out her needles.    She has not been eating or drinking well per the family at bedside and does have low albumin  on lab analysis. She was previously on midodrine at baseline but daughter states was taken off as pt was quite responsive to it and became hypertensive with it.  TSH within normal range. Hgb stable. CT chest with small pericardial effusion, no acute pulmonary changes, still with LLL pneumonia. Lactate stable. No elevated wbc but is hypothermic 95.6, despite ~1L of fluid pt remained hypotensive and was started on levo. BS has improved with amp of d50. Pt denies any complaints with exception of being tired.   Ccm was asked to admit for vasopressor titration.   D/w was had with family about cvc placement. 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 rate 16, height 5' 7 (1.702 m), weight 91.9 kg, SpO2 97%.        Intake/Output Summary (Last 24 hours) at 12/09/2023 1638 Last data filed at 12/08/2023 2200 Gross per 24 hour  Intake 180 ml  Output --  Net 180 ml   Filed Weights   12/08/23 0100 12/09/23 0600 12/09/23 1357  Weight: 90.8 kg 90.1 kg 91.9 kg    Examination: General: chronically ill appearing, arousable HENT: NCAT Lungs: diminished effort but no rales/rhonchi/wheezes Cardiovascular: rrr Abdomen: obese nt/nd bs+ Extremities: no c/c/e L weakness, R shin with reportedly stable hematoma anteriorly, R upper arm fistula in place Neuro: L weakness, slowed speech but arousable, conversant GU: deferred  Resolved problem list   Assessment and Plan   Metabolic encephalopathy: Unclear baseline.  Suspect poor nutrition failure to thrive mild hypotension possible UTI. -- Minimize central acting meds -- Improving overall with hydration and time and treatment of possible infection  Hypotension, presumed septic shock: With source is possible UTI given recent history of the same as well as her report of dysuria (although hard to cooperate given her AMS) -- MAP goal greater than 65, weaned off norepinephrine  6/27 -- Continue midodrine -- Cefepime  plan 5 days, UA with leukocytes, pyuria, although there are some squamous cells so may not be accurate  History of dementia: Unclear if encephalopathy is far from baseline, overall seems  improved -- Continue home Namenda   History of hypothyroidism: -- Continue home Synthroid   History of PE: -- Continue home apixaban   Best Practice (right click and Reselect all SmartList Selections daily)   Diet/type: NPO w/ oral meds SLP c/s DVT prophylaxis DOAC Pressure ulcer(s): present on admission  GI  prophylaxis: N/A Lines: N/A Foley:  N/A Code Status:  full code Last date of multidisciplinary goals of care discussion [pending]  Labs   CBC: Recent Labs  Lab 12/04/23 1633 12/07/23 1616 12/07/23 1700 12/08/23 0405  WBC 8.2  --  7.0 15.4*  NEUTROABS  --   --  5.5  --   HGB 8.2* 8.2* 8.3* 7.9*  HCT 26.3* 24.0* 26.9* 25.1*  MCV 99.2  --  100.0 98.0  PLT 325  --  358 352    Basic Metabolic Panel: Recent Labs  Lab 12/04/23 1633 12/07/23 1616 12/07/23 1810 12/08/23 0405 12/09/23 0251  NA 137 138 139 138 135  K 4.5 4.1 3.4* 3.7 3.9  CL 97*  --  110 97* 97*  CO2 24  --  25 23 27   GLUCOSE 130*  --  102* 145* 80  BUN 58*  --  29* 34* 44*  CREATININE 7.20*  --  4.72* 5.83* 6.95*  CALCIUM  8.8*  --  7.0* 8.7* 8.3*  MG  --   --   --  1.8  --   PHOS 7.6*  --   --   --   --    GFR: Estimated Creatinine Clearance: 9 mL/min (A) (by C-G formula based on SCr of 6.95 mg/dL (H)). Recent Labs  Lab 12/04/23 1633 12/07/23 1700 12/07/23 1714 12/08/23 0405  WBC 8.2 7.0  --  15.4*  LATICACIDVEN  --   --  0.7  --     Liver Function Tests: Recent Labs  Lab 12/04/23 1633 12/07/23 1810  AST  --  17  ALT  --  15  ALKPHOS  --  41  BILITOT  --  0.8  PROT  --  4.6*  ALBUMIN  2.4* 2.0*   No results for input(s): LIPASE, AMYLASE in the last 168 hours. No results for input(s): AMMONIA in the last 168 hours.  ABG    Component Value Date/Time   PHART 7.452 (H) 12/07/2023 1616   PCO2ART 45.2 12/07/2023 1616   PO2ART 78 (L) 12/07/2023 1616   HCO3 32.0 (H) 12/07/2023 1616   TCO2 33 (H) 12/07/2023 1616   ACIDBASEDEF 2.5 (H) 03/22/2022 1657   O2SAT 97 12/07/2023 1616     Coagulation Profile: Recent Labs  Lab 12/07/23 1806  INR 1.8*    Cardiac Enzymes: No results for input(s): CKTOTAL, CKMB, CKMBINDEX, TROPONINI in the last 168 hours.  HbA1C: Hemoglobin A1C  Date/Time Value Ref Range Status  03/20/2017 12:00 AM 7.0  Final   Hgb A1c MFr Bld  Date/Time  Value Ref Range Status  11/29/2023 02:10 PM 5.7 (H) 4.8 - 5.6 % Final    Comment:    (NOTE) Diagnosis of Diabetes The following HbA1c ranges recommended by the American Diabetes Association (ADA) may be used as an aid in the diagnosis of diabetes mellitus.  Hemoglobin             Suggested A1C NGSP%              Diagnosis  <5.7                   Non Diabetic  5.7-6.4  Pre-Diabetic  >6.4                   Diabetic  <7.0                   Glycemic control for                       adults with diabetes.    11/20/2023 09:41 PM 6.3 (H) 4.8 - 5.6 % Final    Comment:    (NOTE)         Prediabetes: 5.7 - 6.4         Diabetes: >6.4         Glycemic control for adults with diabetes: <7.0     CBG: Recent Labs  Lab 12/08/23 2326 12/09/23 0410 12/09/23 0739 12/09/23 1127 12/09/23 1531  GLUCAP 99 77 84 92 96    Review of Systems:   As per HPI  Past Medical History:  She,  has a past medical history of CAD (coronary artery disease), Hypercholesteremia, Hypertension, Hypothyroidism, Kidney disease, Renal disorder, Stroke (HCC) (10/2018), Thyroid  disease, and Type 2 diabetes mellitus (HCC) (10/08/2018).   Surgical History:   Past Surgical History:  Procedure Laterality Date   ABDOMINAL HYSTERECTOMY     AV FISTULA PLACEMENT Right 06/28/2022   Procedure: RIGHT ARM ARTERIOVENOUS (AV) FISTULA CREATION;  Surgeon: Oris Krystal FALCON, MD;  Location: [facility];  Service: Vascular;  Laterality: Right;   CARDIAC CATHETERIZATION N/A 11/03/2015   Procedure: Left Heart Cath and Coronary Angiography;  Surgeon: Alm LELON Clay, MD;  Location: [facility] INVASIVE CV LAB;  Service: Cardiovascular;  Laterality: N/A;   CARDIAC CATHETERIZATION N/A 11/03/2015   Procedure: Coronary Stent Intervention;  Surgeon: Alm LELON Clay, MD;  Location: [facility] INVASIVE CV LAB;  Service: Cardiovascular;  Laterality: N/A;   CARPAL TUNNEL RELEASE     CORONARY ANGIOPLASTY     1 stent   FISTULA SUPERFICIALIZATION  Right 08/16/2022   Procedure: RIGHT ARTERIOVENOUS FISTULA SUPERFICIALIZATION;  Surgeon: Oris Krystal FALCON, MD;  Location: [facility];  Service: Vascular;  Laterality: Right;   TOTAL KNEE ARTHROPLASTY Right 04/02/2019   Procedure: TOTAL KNEE ARTHROPLASTY;  Surgeon: Margrette Taft BRAVO, MD;  Location: [facility];  Service: Orthopedics;  Laterality: Right;     Social History:   reports that she quit smoking about 2 years ago. Her smoking use included cigarettes. She has never used smokeless tobacco. She reports that she does not drink alcohol  and does not use drugs.   Family History:  Her family history includes Heart attack in her brother; Heart disease in her mother; Stroke in her sister. There is no history of Breast cancer.   Allergies Allergies  Allergen Reactions   Glucophage [Metformin] Diarrhea   Tnkase  [Tenecteplase ] Other (See Comments)    Unknown reaction     Home Medications  Prior to Admission medications   Medication Sig Start Date End Date Taking? Authorizing Provider  apixaban  (ELIQUIS ) 5 MG TABS tablet Take 2 tablets (10 mg total) by mouth 2 (two) times daily for 1 day, THEN 1 tablet (5 mg total) 2 (two) times daily. 12/04/23 02/03/24  Singh, Prashant K, MD  aspirin  81 MG chewable tablet Chew 1 tablet (81 mg total) by mouth daily. 05/24/23   de Clint Kill, Cortney E, NP  atorvastatin  (LIPITOR ) 80 MG tablet TAKE 1 TABLET BY MOUTH ONCE DAILY AT  6  IN  THE  EVENING 09/06/23   Mallipeddi, Diannah SQUIBB, MD  bisacodyl  (DULCOLAX) 10 MG suppository Place 10 mg rectally daily as needed (no relief from Milk of Magnesia).    [provider]  buPROPion  (WELLBUTRIN  SR) 150 MG 12 hr tablet Take 1 tablet by mouth twice daily 08/02/23   [name], Laine LABOR, MD  carvedilol  (COREG ) 12.5 MG tablet Take 1 tablet (12.5 mg total) by mouth daily. On non-dialysis days Patient taking differently: Take 12.5 mg by mouth every Monday, Wednesday, and Friday. Give on dialysis days : Monday, Wednesday, Friday 05/24/23   de  Clint Kill, Earle E, NP  DULoxetine  (CYMBALTA ) 60 MG capsule Take 1 capsule by mouth once daily 07/19/23   [name], Laine LABOR, MD  ezetimibe  (ZETIA ) 10 MG tablet Take 1 tablet (10 mg total) by mouth daily. 12/04/23   Dennise Lavada POUR, MD  ferrous sulfate  325 (65 FE) MG tablet Take 325 mg by mouth daily as needed (low iron at dialysis).    [provider]  furosemide  (LASIX ) 80 MG tablet Take 80 mg by mouth every Monday, Wednesday, and Friday. Give on dialysis days : Monday, Wednesday, Friday 10/27/22   [provider]  insulin  aspart (NOVOLOG ) 100 UNIT/ML FlexPen Before each meal 3 times a day, 140-199 - 2 units, 200-250 - 4 units, 251-299 - 6 units,  300-349 - 8 units,  350 or above 10 units. 12/04/23   Singh, Prashant K, MD  insulin  lispro protamine-lispro (HUMALOG  75/25 MIX) (75-25) 100 UNIT/ML SUSP injection Inject 0.06 mLs (6 Units total) into the skin 2 (two) times daily with a meal. Inject 25 units with breakfast, 10 units with lunch, 25 units with supper 12/04/23   Singh, Prashant K, MD  iron sucrose 100 mg in sodium chloride  0.9 % 100 mL Inject 100 mg into the vein as needed (iron-deficiency anemia). Venofer    [provider]  levothyroxine  (SYNTHROID ) 175 MCG tablet Take 175 mcg by mouth daily before breakfast.    [provider]  lidocaine -prilocaine  (EMLA ) cream Apply 1 Application topically every Monday, Wednesday, and Friday. Apply on dialysis days : Monday, Wednesday, Friday    [provider]  Magnesium  Hydroxide (MILK OF MAGNESIA PO) Take 30 mLs by mouth daily as needed (no BM in 3 days).    [provider]  memantine  (NAMENDA ) 10 MG tablet Take 1 tablet (10 mg total) by mouth 2 (two) times daily. 06/22/23   Sethi, Pramod S, MD  Methoxy PEG-Epoetin Beta (MIRCERA) 200 MCG/0.3ML SOSY 200 mcg by Implant route daily as needed (low RBC at dialysis). Mircera    [provider]  nitroGLYCERIN  (NITROSTAT ) 0.4 MG SL tablet Place 0.4 mg under  the tongue every 5 (five) minutes x 3 doses as needed for chest pain.    [provider]  Nutritional Supplements (FEEDING SUPPLEMENT, NEPRO CARB STEADY,) LIQD Take 237 mLs by mouth as needed (missed meal during dialysis.). Patient taking differently: Take 237 mLs by mouth every Monday, Wednesday, and Friday. Give on dialysis days : Monday, Wednesday, Friday - for missed meal 11/25/23   Maree, Pratik D, DO  sevelamer  carbonate (RENVELA ) 0.8 g PACK packet Take 0.8 g by mouth 3 (three) times daily with meals.    [provider]  Sodium Phosphates (ENEMA) ENEM Place 1 enema rectally daily as needed (no relief from bisacodyl  suppository).    [provider]  tobramycin  (TOBREX ) 0.3 % ophthalmic solution Place 1 drop into the right eye every 2 (two) hours.    [provider]  Critical care time:       Donnice JONELLE Beals, MD See TRACEY

## 2023-12-09 NOTE — Plan of Care (Signed)
  Problem: Fluid Volume: Goal: Ability to maintain a balanced intake and output will improve Outcome: Progressing   Problem: Metabolic: Goal: Ability to maintain appropriate glucose levels will improve Outcome: Progressing   Problem: Skin Integrity: Goal: Risk for impaired skin integrity will decrease Outcome: Progressing   Problem: Tissue Perfusion: Goal: Adequacy of tissue perfusion will improve Outcome: Progressing   Problem: Clinical Measurements: Goal: Ability to maintain clinical measurements within normal limits will improve Outcome: Progressing Goal: Diagnostic test results will improve Outcome: Progressing Goal: Respiratory complications will improve Outcome: Progressing Goal: Cardiovascular complication will be avoided Outcome: Progressing   Problem: Nutrition: Goal: Adequate nutrition will be maintained Outcome: Progressing   Problem: Coping: Goal: Level of anxiety will decrease Outcome: Progressing

## 2023-12-09 NOTE — Evaluation (Signed)
 Clinical/Bedside Swallow Evaluation Patient Details  Name: Jennifer [name] MRN: [id number] Date of Birth: 09-15-1955  Today's Date: 12/09/2023 Time: SLP Start Time (ACUTE ONLY): [phone] SLP Stop Time (ACUTE ONLY): 0900 SLP Time Calculation (min) (ACUTE ONLY): 17 min  Past Medical History:  Past Medical History:  Diagnosis Date   CAD (coronary artery disease)    cath 5/23 100% dist RCA lesion treated with 2 overlapping Integrity Resolute DES ( 2.25x69mm, 2.25x34mm), 60% mid RCA treated medically, 99% OM2 not amenable to PCI, 70% D1 lesion, EF normal   Hypercholesteremia    Hypertension    Hypothyroidism    Kidney disease    Renal disorder    Stroke (HCC) 10/2018   Thyroid  disease    Type 2 diabetes mellitus (HCC) 10/08/2018   Past Surgical History:  Past Surgical History:  Procedure Laterality Date   ABDOMINAL HYSTERECTOMY     AV FISTULA PLACEMENT Right 06/28/2022   Procedure: RIGHT ARM ARTERIOVENOUS (AV) FISTULA CREATION;  Surgeon: Oris Krystal FALCON, MD;  Location: [facility];  Service: Vascular;  Laterality: Right;   CARDIAC CATHETERIZATION N/A 11/03/2015   Procedure: Left Heart Cath and Coronary Angiography;  Surgeon: Alm LELON Clay, MD;  Location: [facility] INVASIVE CV LAB;  Service: Cardiovascular;  Laterality: N/A;   CARDIAC CATHETERIZATION N/A 11/03/2015   Procedure: Coronary Stent Intervention;  Surgeon: Alm LELON Clay, MD;  Location: [facility] INVASIVE CV LAB;  Service: Cardiovascular;  Laterality: N/A;   CARPAL TUNNEL RELEASE     CORONARY ANGIOPLASTY     1 stent   FISTULA SUPERFICIALIZATION Right 08/16/2022   Procedure: RIGHT ARTERIOVENOUS FISTULA SUPERFICIALIZATION;  Surgeon: Oris Krystal FALCON, MD;  Location: [facility];  Service: Vascular;  Laterality: Right;   TOTAL KNEE ARTHROPLASTY Right 04/02/2019   Procedure: TOTAL KNEE ARTHROPLASTY;  Surgeon: Margrette Taft BRAVO, MD;  Location: [facility];  Service: Orthopedics;  Laterality: Right;   HPI:  Pt is a 68 yo female recently discharged from [facility] 6/9-6/12  after acute cva and subsequently represented with similar symptoms as this presentation (hypothermia/hypoglycemia/hypoxia and worsening mental status d/c'd 6/23). She was noted to have pneumonia (LLL) and acute pe started on eliquis . She was placed back in rehab. 6/27 she was reportedly in therapy and became unresponsive.  Per daughter pt has been participating in her usual dialysis but most recently had only partial session as pt has become more confused with memory loss and delirium that she pulled out her needles. Question possible UTI.  She has not been eating or drinking well per the family. CXR and CT head (12/07/23) both negative for acute changes. BSE (11/21/23) with oral phase dysphagia and suspected pharyngeal phase dysphagia. No overt s/sx of apsiration present. Dys 1/thin recommended. MBS (05/22/23) also revealed more oral dysphagia, no aspiration and dys 1/thin recommended. PMH: ESRD MWF, H/o cva with L sided deficits, H/o pe on chronic a/c, T2dm, Hypothyroidism, Anxiety, Depression, dementia.    Assessment / Plan / Recommendation  Clinical Impression  Pt presents with suspected primary oral dysphagia, which is consistent with most recent SLP evaluations for swallowing (see HPI). 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 Pt has few mandibular dentition, but is otherwise edentulous. She reports dentures are at home. With encouragement and minimal assist, pt self-fed all POs without clinical signs of aspiration to follow, including 3oz water swallow challenge. Puree, graham cracker and mixed consistency cereal (slightly softened) was consumed with adequate mastication and oral  clearance. Fresh fruit (pineapple) from AM meal tray attempted, with pt briefly masticating and then expectorating into blanket. Given clinical presentation and recent CVA impacting oral motor function, recommend dys 3 diet/thin liquids with adherence to  aspiration precautions. Additionally, staff will need to assist with meals due to assist needed for self-feeding and overall reduced mental status to ensure safety and adequate nutrition. Small bites/sips (straw on L side), slow rate and liquid washes as needed to clear oral cavity effective to improve swallow safety per this clinical eval. SLP to f/u for tolerance and further training in swallow strategies.  SLP Visit Diagnosis: Dysphagia, unspecified (R13.10)    Aspiration Risk  Mild aspiration risk;Moderate aspiration risk    Diet Recommendation Dysphagia 3 (Mech soft);Thin liquid    Liquid Administration via: Straw Medication Administration: Whole meds with liquid Supervision: Staff to assist with self feeding;Full supervision/cueing for compensatory strategies Compensations: Minimize environmental distractions;Slow rate;Small sips/bites;Follow solids with liquid Postural Changes: Seated upright at 90 degrees    Other  Recommendations Oral Care Recommendations: Oral care BID     Assistance Recommended at Discharge    Functional Status Assessment Patient has not had a recent decline in their functional status  Frequency and Duration min 2x/week  2 weeks       Prognosis Prognosis for improved oropharyngeal function: Good Barriers to Reach Goals: Cognitive deficits;Language deficits      Swallow Study   General Date of Onset: 12/08/23 HPI: Pt is a 68 yo female recently discharged from [facility] 6/9-6/12 after acute cva and subsequently represented with similar symptoms as this presentation (hypothermia/hypoglycemia/hypoxia and worsening mental status d/c'd 6/23). She was noted to have pneumonia (LLL) and acute pe started on eliquis . She was placed back in rehab. 6/27 she was reportedly in therapy and became unresponsive.  Per daughter pt has been participating in her usual dialysis but most recently had only partial session as pt has become more confused with memory loss and delirium that  she pulled out her needles. Question possible UTI.  She has not been eating or drinking well per the family. CXR and CT head (12/07/23) both negative for acute changes. BSE (11/21/23) with oral phase dysphagia and suspected pharyngeal phase dysphagia. No overt s/sx of apsiration present. Dys 1/thin recommended. MBS (05/22/23) also revealed more oral dysphagia, no aspiration and dys 1/thin recommended. PMH: ESRD MWF, H/o cva with L sided deficits, H/o pe on chronic a/c, T2dm, Hypothyroidism, Anxiety, Depression, dementia. Type of Study: Bedside Swallow Evaluation Previous Swallow Assessment: see HPI Diet Prior to this Study: Regular;Thin liquids (Level 0) Temperature Spikes Noted: No Respiratory Status: Room air History of Recent Intubation: No Behavior/Cognition: Alert;Cooperative;Pleasant mood;Confused;Requires cueing Oral Cavity Assessment: Within Functional Limits Oral Care Completed by SLP: No Oral Cavity - Dentition: Missing dentition;Dentures, not available Vision: Functional for self-feeding Self-Feeding Abilities: Needs assist Patient Positioning: Upright in bed;Postural control adequate for testing Baseline Vocal Quality: Normal Volitional Cough: Weak Volitional Swallow: Able to elicit    Oral/Motor/Sensory Function Overall Oral Motor/Sensory Function: Mild impairment Facial ROM: Reduced right;Suspected CN VII (facial) dysfunction Facial Symmetry: Abnormal symmetry right;Suspected CN VII (facial) dysfunction Facial Strength: Reduced right;Suspected CN VII (facial) dysfunction Facial Sensation: Within Functional Limits Lingual ROM: Within Functional Limits Lingual Symmetry: Within Functional Limits Lingual Strength: Reduced;Suspected CN XII (hypoglossal) dysfunction   Ice Chips Ice chips: Not tested   Thin Liquid Thin Liquid: Within functional limits Presentation: Self Fed;Straw    Nectar Thick Nectar Thick Liquid: Not tested   Honey Thick Honey  Thick Liquid: Not tested   Puree  Puree: Within functional limits Presentation: Self Fed;Spoon   Solid     Solid: Impaired Presentation: Self Fed Oral Phase Impairments: Impaired mastication Oral Phase Functional Implications: Impaired mastication       Wilder Kin, MA, CCC-SLP Acute Rehabilitation Services Office Number: [phone]]  Wilder KANDICE Kin 12/09/2023,9:33 AM

## 2023-12-09 NOTE — Progress Notes (Signed)
 [facility] Progress Note  Subjective:  Seen in ICU Lethargic, no changes from yest  Vitals:   12/09/23 0700 12/09/23 0800 12/09/23 0900 12/09/23 1000  BP: (!) 122/47 (!) 113/47 127/67 (!) 122/94  Pulse: 74 73 69 73  Resp: 13 13 15 14   Temp:  98 F (36.7 C)    TempSrc:  Oral    SpO2: 93% 94% 95% 95%  Weight:      Height:        Exam: Gen Obese, elderly lady lethargic No jvd or bruits Chest clear bilat to bases RRR no MRG Abd soft ntnd no mass or ascites +bs Ext mild 1+ hip edema bilat, no PT edema  Neuro is as above    RUA AVF + bruit   CXR 6/26 - no active disease    OP HD: [facility] 4h   B400   90kg   2K   AVF   Heparin  none Last OP HD 6/25, post wt 91.2kg   Prior to that possibly was on HHD       Assessment/ Plan: Acute metabolic encephalopathy: per pmd ESRD: on HD MWF. Had HD on 6/25 Wed. Missed HD Friday. Plan iHD today.  Hypotension: off pressors since yest afternoon. BP's good 120/70 Volume: min hip edema, CXR clear, at dry wt. Min UF w/ HD  Anemia of esrd: Hb 8- 9 here, follow.       Myer Fret MD  [facility] 12/09/2023, 11:03 AM  Recent Labs  Lab 12/04/23 1633 12/07/23 1616 12/07/23 1700 12/07/23 1810 12/08/23 0405 12/09/23 0251  HGB 8.2*   < > 8.3*  --  7.9*  --   ALBUMIN  2.4*  --   --  2.0*  --   --   CALCIUM  8.8*  --   --  7.0* 8.7* 8.3*  PHOS 7.6*  --   --   --   --   --   CREATININE 7.20*  --   --  4.72* 5.83* 6.95*  K 4.5   < >  --  3.4* 3.7 3.9   < > = values in this interval not displayed.   No results for input(s): IRON, TIBC, FERRITIN in the last 168 hours. Inpatient medications:  apixaban   5 mg Oral BID   buPROPion   150 mg Oral BID WC   Chlorhexidine  Gluconate Cloth  6 each Topical Daily   Chlorhexidine  Gluconate Cloth  6 each Topical Q0600   levothyroxine   175 mcg Oral Q0600   memantine   10 mg Oral BID   midodrine  10 mg Oral TID WC   sevelamer  carbonate  0.8 g Oral TID WC    ceFEPime  (MAXIPIME ) IV  Stopped (12/08/23 1025)   docusate sodium , mouth rinse, polyethylene glycol

## 2023-12-09 NOTE — Progress Notes (Signed)
 NAME:  Jennifer [name], MRN:  [id number], DOB:  [birth year], LOS: 1 ADMISSION DATE:  12/07/2023, CONSULTATION DATE:  12/08/23 REFERRING MD:  EDP, CHIEF COMPLAINT:  ams   History of Present Illness:  68 yo female recently discharged from [facility] 6/9-6/12 after acute cva and subsequently represented with similar symptoms as this presentation (hypothermia/hypoglycemia/hypoxia and worsening mental status d/c'd 6/23). She was noted to have pneumonia (LLL) and acute pe started on eliquis . She was placed back in rehab. Today she was reportedly in therapy and became unresponsive. She was hypoxic to 77 on RA and hypoglycemic to 40's. Per daughter pt has been participating in her usual dialysis but most recently had only partial session as pt has become more confused with memory loss and delirium that she pulled out her needles.    She has not been eating or drinking well per the family at bedside and does have low albumin  on lab analysis. She was previously on midodrine at baseline but daughter states was taken off as pt was quite responsive to it and became hypertensive with it.  TSH within normal range. Hgb stable. CT chest with small pericardial effusion, no acute pulmonary changes, still with LLL pneumonia. Lactate stable. No elevated wbc but is hypothermic 95.6, despite ~1L of fluid pt remained hypotensive and was started on levo. BS has improved with amp of d50. Pt denies any complaints with exception of being tired.   Ccm was asked to admit for vasopressor titration.   D/w was had with family about cvc placement. 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 rate 16, height 5' 7 (1.702 m), weight 91.9 kg, SpO2 97%.        Intake/Output Summary (Last 24 hours) at 12/09/2023 1638 Last data filed at 12/08/2023 2200 Gross per 24 hour  Intake 180 ml  Output --  Net 180 ml   Filed Weights   12/08/23 0100 12/09/23 0600 12/09/23 1357  Weight: 90.8 kg 90.1 kg 91.9 kg    Examination: General: chronically ill appearing, arousable HENT: NCAT Lungs: diminished effort but no rales/rhonchi/wheezes Cardiovascular: rrr Abdomen: obese nt/nd bs+ Extremities: no c/c/e L weakness, R shin with reportedly stable hematoma anteriorly, R upper arm fistula in place Neuro: L weakness, slowed speech but arousable, conversant GU: deferred  Resolved problem list   Assessment and Plan   Metabolic encephalopathy: Unclear baseline.  Suspect poor nutrition failure to thrive mild hypotension possible UTI. -- Minimize central acting meds -- Improving overall with hydration and time and treatment of possible infection  Hypotension, presumed septic shock: With source is possible UTI given recent history of the same as well as her report of dysuria (although hard to cooperate given her AMS) -- MAP goal greater than 65, weaned off norepinephrine  6/27 -- Continue midodrine -- Cefepime  plan 5 days, UA with leukocytes, pyuria, although there are some squamous cells so may not be accurate  History of dementia: Unclear if encephalopathy is far from baseline, overall seems  improved -- Continue home Namenda   History of hypothyroidism: -- Continue home Synthroid   History of PE: -- Continue home apixaban   Best Practice (right click and Reselect all SmartList Selections daily)   Diet/type: NPO w/ oral meds SLP c/s DVT prophylaxis DOAC Pressure ulcer(s): present on admission  GI  prophylaxis: N/A Lines: N/A Foley:  N/A Code Status:  full code Last date of multidisciplinary goals of care discussion [pending]  Labs   CBC: Recent Labs  Lab 12/04/23 1633 12/07/23 1616 12/07/23 1700 12/08/23 0405  WBC 8.2  --  7.0 15.4*  NEUTROABS  --   --  5.5  --   HGB 8.2* 8.2* 8.3* 7.9*  HCT 26.3* 24.0* 26.9* 25.1*  MCV 99.2  --  100.0 98.0  PLT 325  --  358 352    Basic Metabolic Panel: Recent Labs  Lab 12/04/23 1633 12/07/23 1616 12/07/23 1810 12/08/23 0405 12/09/23 0251  NA 137 138 139 138 135  K 4.5 4.1 3.4* 3.7 3.9  CL 97*  --  110 97* 97*  CO2 24  --  25 23 27   GLUCOSE 130*  --  102* 145* 80  BUN 58*  --  29* 34* 44*  CREATININE 7.20*  --  4.72* 5.83* 6.95*  CALCIUM  8.8*  --  7.0* 8.7* 8.3*  MG  --   --   --  1.8  --   PHOS 7.6*  --   --   --   --    GFR: Estimated Creatinine Clearance: 9 mL/min (A) (by C-G formula based on SCr of 6.95 mg/dL (H)). Recent Labs  Lab 12/04/23 1633 12/07/23 1700 12/07/23 1714 12/08/23 0405  WBC 8.2 7.0  --  15.4*  LATICACIDVEN  --   --  0.7  --     Liver Function Tests: Recent Labs  Lab 12/04/23 1633 12/07/23 1810  AST  --  17  ALT  --  15  ALKPHOS  --  41  BILITOT  --  0.8  PROT  --  4.6*  ALBUMIN  2.4* 2.0*   No results for input(s): LIPASE, AMYLASE in the last 168 hours. No results for input(s): AMMONIA in the last 168 hours.  ABG    Component Value Date/Time   PHART 7.452 (H) 12/07/2023 1616   PCO2ART 45.2 12/07/2023 1616   PO2ART 78 (L) 12/07/2023 1616   HCO3 32.0 (H) 12/07/2023 1616   TCO2 33 (H) 12/07/2023 1616   ACIDBASEDEF 2.5 (H) 03/22/2022 1657   O2SAT 97 12/07/2023 1616     Coagulation Profile: Recent Labs  Lab 12/07/23 1806  INR 1.8*    Cardiac Enzymes: No results for input(s): CKTOTAL, CKMB, CKMBINDEX, TROPONINI in the last 168 hours.  HbA1C: Hemoglobin A1C  Date/Time Value Ref Range Status  03/20/2017 12:00 AM 7.0  Final   Hgb A1c MFr Bld  Date/Time  Value Ref Range Status  11/29/2023 02:10 PM 5.7 (H) 4.8 - 5.6 % Final    Comment:    (NOTE) Diagnosis of Diabetes The following HbA1c ranges recommended by the American Diabetes Association (ADA) may be used as an aid in the diagnosis of diabetes mellitus.  Hemoglobin             Suggested A1C NGSP%              Diagnosis  <5.7                   Non Diabetic  5.7-6.4  Pre-Diabetic  >6.4                   Diabetic  <7.0                   Glycemic control for                       adults with diabetes.    11/20/2023 09:41 PM 6.3 (H) 4.8 - 5.6 % Final    Comment:    (NOTE)         Prediabetes: 5.7 - 6.4         Diabetes: >6.4         Glycemic control for adults with diabetes: <7.0     CBG: Recent Labs  Lab 12/08/23 2326 12/09/23 0410 12/09/23 0739 12/09/23 1127 12/09/23 1531  GLUCAP 99 77 84 92 96    Review of Systems:   As per HPI  Past Medical History:  She,  has a past medical history of CAD (coronary artery disease), Hypercholesteremia, Hypertension, Hypothyroidism, Kidney disease, Renal disorder, Stroke (HCC) (10/2018), Thyroid  disease, and Type 2 diabetes mellitus (HCC) (10/08/2018).   Surgical History:   Past Surgical History:  Procedure Laterality Date   ABDOMINAL HYSTERECTOMY     AV FISTULA PLACEMENT Right 06/28/2022   Procedure: RIGHT ARM ARTERIOVENOUS (AV) FISTULA CREATION;  Surgeon: Oris Krystal FALCON, MD;  Location: [facility];  Service: Vascular;  Laterality: Right;   CARDIAC CATHETERIZATION N/A 11/03/2015   Procedure: Left Heart Cath and Coronary Angiography;  Surgeon: Alm LELON Clay, MD;  Location: [facility] INVASIVE CV LAB;  Service: Cardiovascular;  Laterality: N/A;   CARDIAC CATHETERIZATION N/A 11/03/2015   Procedure: Coronary Stent Intervention;  Surgeon: Alm LELON Clay, MD;  Location: [facility] INVASIVE CV LAB;  Service: Cardiovascular;  Laterality: N/A;   CARPAL TUNNEL RELEASE     CORONARY ANGIOPLASTY     1 stent   FISTULA SUPERFICIALIZATION  Right 08/16/2022   Procedure: RIGHT ARTERIOVENOUS FISTULA SUPERFICIALIZATION;  Surgeon: Oris Krystal FALCON, MD;  Location: [facility];  Service: Vascular;  Laterality: Right;   TOTAL KNEE ARTHROPLASTY Right 04/02/2019   Procedure: TOTAL KNEE ARTHROPLASTY;  Surgeon: Margrette Taft BRAVO, MD;  Location: [facility];  Service: Orthopedics;  Laterality: Right;     Social History:   reports that she quit smoking about 2 years ago. Her smoking use included cigarettes. She has never used smokeless tobacco. She reports that she does not drink alcohol  and does not use drugs.   Family History:  Her family history includes Heart attack in her brother; Heart disease in her mother; Stroke in her sister. There is no history of Breast cancer.   Allergies Allergies  Allergen Reactions   Glucophage [Metformin] Diarrhea   Tnkase  [Tenecteplase ] Other (See Comments)    Unknown reaction     Home Medications  Prior to Admission medications   Medication Sig Start Date End Date Taking? Authorizing Provider  apixaban  (ELIQUIS ) 5 MG TABS tablet Take 2 tablets (10 mg total) by mouth 2 (two) times daily for 1 day, THEN 1 tablet (5 mg total) 2 (two) times daily. 12/04/23 02/03/24  Singh, Prashant K, MD  aspirin  81 MG chewable tablet Chew 1 tablet (81 mg total) by mouth daily. 05/24/23   de Clint Kill, Cortney E, NP  atorvastatin  (LIPITOR ) 80 MG tablet TAKE 1 TABLET BY MOUTH ONCE DAILY AT  6  IN  THE  EVENING 09/06/23   Mallipeddi, Diannah SQUIBB, MD  bisacodyl  (DULCOLAX) 10 MG suppository Place 10 mg rectally daily as needed (no relief from Milk of Magnesia).    [provider]  buPROPion  (WELLBUTRIN  SR) 150 MG 12 hr tablet Take 1 tablet by mouth twice daily 08/02/23   [name], Laine LABOR, MD  carvedilol  (COREG ) 12.5 MG tablet Take 1 tablet (12.5 mg total) by mouth daily. On non-dialysis days Patient taking differently: Take 12.5 mg by mouth every Monday, Wednesday, and Friday. Give on dialysis days : Monday, Wednesday, Friday 05/24/23   de  Clint Kill, Earle E, NP  DULoxetine  (CYMBALTA ) 60 MG capsule Take 1 capsule by mouth once daily 07/19/23   [name], Laine LABOR, MD  ezetimibe  (ZETIA ) 10 MG tablet Take 1 tablet (10 mg total) by mouth daily. 12/04/23   Dennise Lavada POUR, MD  ferrous sulfate  325 (65 FE) MG tablet Take 325 mg by mouth daily as needed (low iron at dialysis).    [provider]  furosemide  (LASIX ) 80 MG tablet Take 80 mg by mouth every Monday, Wednesday, and Friday. Give on dialysis days : Monday, Wednesday, Friday 10/27/22   [provider]  insulin  aspart (NOVOLOG ) 100 UNIT/ML FlexPen Before each meal 3 times a day, 140-199 - 2 units, 200-250 - 4 units, 251-299 - 6 units,  300-349 - 8 units,  350 or above 10 units. 12/04/23   Singh, Prashant K, MD  insulin  lispro protamine-lispro (HUMALOG  75/25 MIX) (75-25) 100 UNIT/ML SUSP injection Inject 0.06 mLs (6 Units total) into the skin 2 (two) times daily with a meal. Inject 25 units with breakfast, 10 units with lunch, 25 units with supper 12/04/23   Singh, Prashant K, MD  iron sucrose 100 mg in sodium chloride  0.9 % 100 mL Inject 100 mg into the vein as needed (iron-deficiency anemia). Venofer    [provider]  levothyroxine  (SYNTHROID ) 175 MCG tablet Take 175 mcg by mouth daily before breakfast.    [provider]  lidocaine -prilocaine  (EMLA ) cream Apply 1 Application topically every Monday, Wednesday, and Friday. Apply on dialysis days : Monday, Wednesday, Friday    [provider]  Magnesium  Hydroxide (MILK OF MAGNESIA PO) Take 30 mLs by mouth daily as needed (no BM in 3 days).    [provider]  memantine  (NAMENDA ) 10 MG tablet Take 1 tablet (10 mg total) by mouth 2 (two) times daily. 06/22/23   Sethi, Pramod S, MD  Methoxy PEG-Epoetin Beta (MIRCERA) 200 MCG/0.3ML SOSY 200 mcg by Implant route daily as needed (low RBC at dialysis). Mircera    [provider]  nitroGLYCERIN  (NITROSTAT ) 0.4 MG SL tablet Place 0.4 mg under  the tongue every 5 (five) minutes x 3 doses as needed for chest pain.    [provider]  Nutritional Supplements (FEEDING SUPPLEMENT, NEPRO CARB STEADY,) LIQD Take 237 mLs by mouth as needed (missed meal during dialysis.). Patient taking differently: Take 237 mLs by mouth every Monday, Wednesday, and Friday. Give on dialysis days : Monday, Wednesday, Friday - for missed meal 11/25/23   Maree, Pratik D, DO  sevelamer  carbonate (RENVELA ) 0.8 g PACK packet Take 0.8 g by mouth 3 (three) times daily with meals.    [provider]  Sodium Phosphates (ENEMA) ENEM Place 1 enema rectally daily as needed (no relief from bisacodyl  suppository).    [provider]  tobramycin  (TOBREX ) 0.3 % ophthalmic solution Place 1 drop into the right eye every 2 (two) hours.    [provider]  Critical care time:       Donnice JONELLE Beals, MD See TRACEY

## 2023-12-10 DIAGNOSIS — G9340 Encephalopathy, unspecified: Secondary | ICD-10-CM | POA: Diagnosis not present

## 2023-12-10 DIAGNOSIS — N186 End stage renal disease: Secondary | ICD-10-CM | POA: Diagnosis not present

## 2023-12-10 DIAGNOSIS — R579 Shock, unspecified: Secondary | ICD-10-CM

## 2023-12-10 LAB — CBC WITH DIFFERENTIAL/PLATELET
Abs Immature Granulocytes: 0.04 10*3/uL (ref 0.00–0.07)
Basophils Absolute: 0.1 10*3/uL (ref 0.0–0.1)
Basophils Relative: 1 %
Eosinophils Absolute: 0.1 10*3/uL (ref 0.0–0.5)
Eosinophils Relative: 2 %
HCT: 27.6 % — ABNORMAL LOW (ref 36.0–46.0)
Hemoglobin: 8.5 g/dL — ABNORMAL LOW (ref 12.0–15.0)
Immature Granulocytes: 1 %
Lymphocytes Relative: 18 %
Lymphs Abs: 1.3 10*3/uL (ref 0.7–4.0)
MCH: 30.7 pg (ref 26.0–34.0)
MCHC: 30.8 g/dL (ref 30.0–36.0)
MCV: 99.6 fL (ref 80.0–100.0)
Monocytes Absolute: 0.5 10*3/uL (ref 0.1–1.0)
Monocytes Relative: 7 %
Neutro Abs: 5.2 10*3/uL (ref 1.7–7.7)
Neutrophils Relative %: 71 %
Platelets: 396 10*3/uL (ref 150–400)
RBC: 2.77 MIL/uL — ABNORMAL LOW (ref 3.87–5.11)
RDW: 15.9 % — ABNORMAL HIGH (ref 11.5–15.5)
WBC: 7.3 10*3/uL (ref 4.0–10.5)
nRBC: 0.4 % — ABNORMAL HIGH (ref 0.0–0.2)

## 2023-12-10 LAB — BASIC METABOLIC PANEL WITH GFR
Anion gap: 14 (ref 5–15)
BUN: 25 mg/dL — ABNORMAL HIGH (ref 8–23)
CO2: 26 mmol/L (ref 22–32)
Calcium: 8.3 mg/dL — ABNORMAL LOW (ref 8.9–10.3)
Chloride: 96 mmol/L — ABNORMAL LOW (ref 98–111)
Creatinine, Ser: 4.73 mg/dL — ABNORMAL HIGH (ref 0.44–1.00)
GFR, Estimated: 10 mL/min — ABNORMAL LOW (ref >60)
Glucose, Bld: 92 mg/dL (ref 70–99)
Potassium: 3.8 mmol/L (ref 3.5–5.1)
Sodium: 136 mmol/L (ref 135–145)

## 2023-12-10 LAB — HEPATIC FUNCTION PANEL
ALT: 12 U/L (ref 0–44)
AST: 14 U/L — ABNORMAL LOW (ref 15–41)
Albumin: 2.7 g/dL — ABNORMAL LOW (ref 3.5–5.0)
Alkaline Phosphatase: 48 U/L (ref 38–126)
Bilirubin, Direct: <0.1 mg/dL (ref 0.0–0.2)
Total Bilirubin: 0.5 mg/dL (ref 0.0–1.2)
Total Protein: 5.7 g/dL — ABNORMAL LOW (ref 6.5–8.1)

## 2023-12-10 LAB — GLUCOSE, CAPILLARY
Glucose-Capillary: 96 mg/dL (ref 70–99)
Glucose-Capillary: 89 mg/dL (ref 70–99)
Glucose-Capillary: 172 mg/dL — ABNORMAL HIGH (ref 70–99)
Glucose-Capillary: 135 mg/dL — ABNORMAL HIGH (ref 70–99)
Glucose-Capillary: 175 mg/dL — ABNORMAL HIGH (ref 70–99)

## 2023-12-10 LAB — MAGNESIUM: Magnesium: 2 mg/dL (ref 1.7–2.4)

## 2023-12-10 LAB — PHOSPHORUS: Phosphorus: 4.2 mg/dL (ref 2.5–4.6)

## 2023-12-10 MED ORDER — INSULIN ASPART 100 UNIT/ML IJ SOLN: 0.0000 [IU] | Freq: Every day | INTRAMUSCULAR | Status: DC

## 2023-12-10 MED ORDER — FERROUS SULFATE 325 (65 FE) MG PO TABS: 325.0000 mg | ORAL_TABLET | Freq: Every day | ORAL | Status: DC | PRN

## 2023-12-10 MED ORDER — DULOXETINE HCL 60 MG PO CPEP
60.0000 mg | ORAL_CAPSULE | Freq: Every day | ORAL | Status: DC
  Administered 2023-12-10 – 2023-12-12 (×3): 60 mg via ORAL
  Filled 2023-12-10 (×3): qty 1

## 2023-12-10 MED ORDER — INSULIN ASPART 100 UNIT/ML IJ SOLN: 0.0000 [IU] | Freq: Three times a day (TID) | INTRAMUSCULAR | Status: DC

## 2023-12-10 MED ORDER — ATORVASTATIN CALCIUM 80 MG PO TABS
80.0000 mg | ORAL_TABLET | Freq: Every evening | ORAL | Status: DC
  Administered 2023-12-10 – 2023-12-12 (×3): 80 mg via ORAL
  Filled 2023-12-10 (×3): qty 1

## 2023-12-10 MED ORDER — CHLORHEXIDINE GLUCONATE CLOTH 2 % EX PADS
6.0000 | MEDICATED_PAD | Freq: Every day | CUTANEOUS | Status: DC
  Administered 2023-12-11: 6 via TOPICAL

## 2023-12-10 MED ORDER — EZETIMIBE 10 MG PO TABS
10.0000 mg | ORAL_TABLET | Freq: Every evening | ORAL | Status: DC
  Administered 2023-12-10 – 2023-12-12 (×3): 10 mg via ORAL
  Filled 2023-12-10 (×3): qty 1

## 2023-12-10 MED ORDER — ASPIRIN 81 MG PO TBEC
81.0000 mg | DELAYED_RELEASE_TABLET | Freq: Every day | ORAL | Status: DC
  Administered 2023-12-10 – 2023-12-12 (×3): 81 mg via ORAL
  Filled 2023-12-10 (×3): qty 1

## 2023-12-10 NOTE — Progress Notes (Signed)
 [facility] Progress Note  Subjective:  Moved out of ICU yesterday, on the floor now 1st day pt is wide awake and interacting  Vitals:   12/10/23 0430 12/10/23 0812 12/10/23 1000 12/10/23 1650  BP: 112/69 (!) 125/51  (!) 116/47  Pulse: 77 70  68  Resp: 16 14 16    Temp: 97.6 F (36.4 C) 97.8 F (36.6 C)  98.1 F (36.7 C)  TempSrc: Axillary Oral    SpO2: 94% 98% 100% 96%  Weight:      Height:        Exam: Gen Obese, elderly lady, alert and interactive today R facial droop from recent CVA about 3mos ago No jvd or bruits Chest clear bilat to bases RRR no MRG Abd soft ntnd no mass or ascites +bs Ext mild 1+ hip edema bilat, no PT edema  Neuro is as above    RUA AVF + bruit   CXR 6/26 - no active disease    OP HD: [facility] 4h   B400   90kg   2K   AVF   Heparin  none Last OP HD 6/25, post wt 91.2kg   Prior to that possibly was on HHD       Assessment/ Plan: Acute metabolic encephalopathy: per pmd H/o CVA: about 3 mos ago, w/ residual R facial droop ESRD: on HD MWF. Had HD on 6/25 Wed. Missed HD Friday. Had HD on Sat here. Next HD Monday.  Hypotension: BP's are normal to low-normal  Volume: min hip edema, CXR clear, at dry wt. At dry wt. Min UF w/ HD tomorrow.  Anemia of esrd: Hb 8- 9 here, follow.       Myer Fret MD  [facility] 12/10/2023, 6:41 PM  Recent Labs  Lab 12/04/23 1633 12/07/23 1616 12/07/23 1810 12/08/23 0405 12/09/23 0251 12/10/23 0835 12/10/23 1545  HGB 8.2*   < >  --  7.9*  --   --  8.5*  ALBUMIN  2.4*  --  2.0*  --   --   --  2.7*  CALCIUM  8.8*  --  7.0* 8.7* 8.3* 8.3*  --   PHOS 7.6*  --   --   --   --  4.2  --   CREATININE 7.20*  --  4.72* 5.83* 6.95* 4.73*  --   K 4.5   < > 3.4* 3.7 3.9 3.8  --    < > = values in this interval not displayed.   No results for input(s): IRON, TIBC, FERRITIN in the last 168 hours. Inpatient medications:  apixaban   5 mg Oral BID   aspirin  EC  81 mg Oral Daily   atorvastatin   80 mg Oral  QPM   buPROPion   150 mg Oral BID WC   Chlorhexidine  Gluconate Cloth  6 each Topical Daily   Chlorhexidine  Gluconate Cloth  6 each Topical Q0600   DULoxetine   60 mg Oral Daily   ezetimibe   10 mg Oral QPM   insulin  aspart  0-5 Units Subcutaneous QHS   insulin  aspart  0-6 Units Subcutaneous TID WC   levothyroxine   175 mcg Oral Q0600   memantine   10 mg Oral BID   midodrine  10 mg Oral TID WC   sevelamer  carbonate  0.8 g Oral TID WC    ceFEPime  (MAXIPIME ) IV 1 g (12/10/23 1733)   docusate sodium , ferrous sulfate , mouth rinse, polyethylene glycol

## 2023-12-10 NOTE — Evaluation (Signed)
 Occupational Therapy Evaluation Patient Details Name: Jennifer [name] MRN: [id number] DOB: March 03, 1956 Today's Date: 12/10/2023   History of Present Illness   Jennifer [name] is a 68 y.o. presenting to [facility] ED on 6/26 with 2 recent hospitalizations due to AMS and hypoxia. Pt diagnosed with sepsis with acute hypoxic respiratory failure without septic shock. PMH:  hypertension, CAD, CVA, diabetes mellitus type 2, ESRD on HD, hypothyroidism, anxiety, depression, and memory loss, altered mental status and hypoglycemia.     Clinical Impressions Pt needing assist at baseline with ADLs/mobility, from SNF.Pt currently needing up to max A for ADLs, mod +2 for bed mobility and mod +2 for step pivot transfer to/from [facility] with RW. Pt needing incr time and cues to perform basic ADL/mobility tasks during session. Pt presenting with impairments listed below, will follow acutely. Patient will benefit from continued inpatient follow up therapy, <3 hours/day to maximize safety/ind with ADL/functional mobility.      If plan is discharge home, recommend the following:   A lot of help with walking and/or transfers;Two people to help with walking and/or transfers;A lot of help with bathing/dressing/bathroom;Assistance with cooking/housework;Assistance with feeding;Direct supervision/assist for medications management;Assist for transportation;Help with stairs or ramp for entrance     Functional Status Assessment   Patient has had a recent decline in their functional status and demonstrates the ability to make significant improvements in function in a reasonable and predictable amount of time.     Equipment Recommendations   Other (comment) (defer)     Recommendations for Other Services   PT consult     Precautions/Restrictions   Precautions Precautions: Fall Recall of Precautions/Restrictions: Impaired Restrictions Weight Bearing Restrictions Per Provider Order: No     Mobility Bed  Mobility Overal bed mobility: Needs Assistance Bed Mobility: Supine to Sit, Sit to Supine       Sit to supine: Mod assist, +2 for physical assistance   General bed mobility comments: Max A with heavy cues for initiation for supine to sitting and Mod A with trunk, bil LE for sitting to supine.    Transfers Overall transfer level: Needs assistance Equipment used: Rolling walker (2 wheels) Transfers: Sit to/from Stand, Bed to chair/wheelchair/BSC Sit to Stand: Mod assist, +2 physical assistance     Step pivot transfers: Mod assist, From elevated surface, +2 physical assistance, +2 safety/equipment     General transfer comment: very unsteady requiring heavy multi modal cues for sequencing and safety with step pivot transfer and pt trying to sit early both to/from [facility] to EOB      Balance Overall balance assessment: Needs assistance Sitting-balance support: Feet supported, No upper extremity supported Sitting balance-Leahy Scale: Fair Sitting balance - Comments: fair/good seated at EOB and on BSC   Standing balance support: Bilateral upper extremity supported, Reliant on assistive device for balance, During functional activity Standing balance-Leahy Scale: Poor Standing balance comment: requires external support                           ADL either performed or assessed with clinical judgement   ADL Overall ADL's : Needs assistance/impaired Eating/Feeding: Sitting;Minimal assistance   Grooming: Sitting;Minimal assistance   Upper Body Bathing: Minimal assistance;Sitting   Lower Body Bathing: Maximal assistance;Bed level   Upper Body Dressing : Moderate assistance;Sitting   Lower Body Dressing: Maximal assistance;Bed level   Toilet Transfer: Maximal assistance;+2 for physical assistance;Stand-pivot;Moderate assistance   Toileting- Clothing Manipulation and Hygiene: Maximal assistance  Functional mobility during ADLs: Moderate assistance;Maximal  assistance;Total assistance;Rolling walker (2 wheels)       Vision   Additional Comments: will further assess     Perception Perception: Not tested       Praxis Praxis: Not tested       Pertinent Vitals/Pain Pain Assessment Pain Assessment: Faces Pain Score: 2  Faces Pain Scale: Hurts a little bit Pain Location: low back after standing Pain Descriptors / Indicators: Discomfort, Sore Pain Intervention(s): Limited activity within patient's tolerance     Extremity/Trunk Assessment Upper Extremity Assessment Upper Extremity Assessment: Generalized weakness   Lower Extremity Assessment Lower Extremity Assessment: Defer to PT evaluation   Cervical / Trunk Assessment Cervical / Trunk Assessment: Kyphotic   Communication Communication Communication: Impaired Factors Affecting Communication: Difficulty expressing self   Cognition Arousal: Alert Behavior During Therapy: Flat affect Cognition: History of cognitive impairments                               Following commands: Impaired Following commands impaired: Follows one step commands inconsistently     Cueing  General Comments   Cueing Techniques: Verbal cues;Tactile cues  VSS   Exercises     Shoulder Instructions      Home Living Family/patient expects to be discharged to:: Private residence Living Arrangements: Children;Spouse/significant other Available Help at Discharge: Family;Available 24 hours/day Type of Home: House Home Access: Ramped entrance     Home Layout: One level     Bathroom Shower/Tub: Chief Strategy Officer: Standard Bathroom Accessibility: Yes   Home Equipment: Agricultural consultant (2 wheels);Cane - single point;BSC/3in1;Shower seat;Hospital bed;Wheelchair - manual;Grab bars - tub/shower   Additional Comments: Pt was at [employer] prior to discharge.      Prior Functioning/Environment Prior Level of Function : Needs assist       Physical Assist : ADLs  (physical);Mobility (physical) Mobility (physical): Transfers;Gait;Stairs;Bed mobility   Mobility Comments: (P) pt was getting assistance for mobility at [facility]. ADLs Comments: assist for ADLs    OT Problem List: Decreased range of motion;Decreased strength;Decreased activity tolerance;Impaired balance (sitting and/or standing);Impaired vision/perception;Decreased coordination;Decreased cognition;Decreased safety awareness;Impaired tone;Impaired UE functional use;Pain   OT Treatment/Interventions: Self-care/ADL training;Therapeutic exercise;Neuromuscular education;Energy conservation;DME and/or AE instruction;Therapeutic activities;Patient/family education;Visual/perceptual remediation/compensation;Cognitive remediation/compensation;Balance training      OT Goals(Current goals can be found in the care plan section)   Acute Rehab OT Goals Patient Stated Goal: none stated OT Goal Formulation: With patient Time For Goal Achievement: 12/24/23 Potential to Achieve Goals: Fair ADL Goals Pt Will Perform Upper Body Dressing: with supervision;sitting Pt Will Perform Lower Body Dressing: with min assist;sitting/lateral leans;sit to/from stand Pt Will Transfer to Toilet: with min assist;ambulating;regular height toilet Additional ADL Goal #1: pt will follow 2 step command with min cues in prep for ADLs   OT Frequency:  Min 2X/week    Co-evaluation PT/OT/SLP Co-Evaluation/Treatment: Yes Reason for Co-Treatment: Complexity of the patient's impairments (multi-system involvement);For patient/therapist safety PT goals addressed during session: Mobility/safety with mobility;Balance;Proper use of DME OT goals addressed during session: ADL's and self-care      AM-PAC OT 6 Clicks Daily Activity     Outcome Measure Help from another person eating meals?: A Little Help from another person taking care of personal grooming?: A Little Help from another person toileting, which includes using  toliet, bedpan, or urinal?: A Lot Help from another person bathing (including washing, rinsing, drying)?: A Lot Help from another person to  put on and taking off regular upper body clothing?: A Lot Help from another person to put on and taking off regular lower body clothing?: A Lot 6 Click Score: 14   End of Session Equipment Utilized During Treatment: Gait belt;Rolling walker (2 wheels) Nurse Communication: Mobility status  Activity Tolerance: Patient tolerated treatment well Patient left: in bed;with call bell/phone within reach;with bed alarm set  OT Visit Diagnosis: Unsteadiness on feet (R26.81);Other abnormalities of gait and mobility (R26.89);Muscle weakness (generalized) (M62.81);History of falling (Z91.81);Other symptoms and signs involving the nervous system (R29.898);Cognitive communication deficit (R41.841)                Time: 8664-8596 OT Time Calculation (min): 28 min Charges:  OT General Charges $OT Visit: 1 Visit OT Evaluation $OT Eval Moderate Complexity: 1 Mod  Gannon Heinzman K, OTD, OTR/L SecureChat Preferred Acute Rehab [phone]   Laneta POUR Koonce 12/10/2023, 3:54 PM

## 2023-12-10 NOTE — Plan of Care (Signed)
  Problem: Education: Goal: Ability to describe self-care measures that may prevent or decrease complications (Diabetes Survival Skills Education) will improve Outcome: Not Progressing Goal: Individualized Educational Video(s) Outcome: Not Applicable

## 2023-12-10 NOTE — Progress Notes (Signed)
 New Admission Note:  Arrival Method:Bed  Mental Orientation: Alert to self only Telemetry: Box 14 NSR Assessment: Completed Skin: Warm and dry. See Flowsheet IV: NSLs Pain: Denies Tubes: N/A Safety Measures: Safety Fall Prevention Plan initiated.  Admission: Completed 5 M  Orientation: Patient has been orientated to the room, unit and the staff. Welcome booklet given.  Family: N/A  Orders have been reviewed and implemented. Will continue to monitor the patient. Call light has been placed within reach and bed alarm has been activated.   Durwood Dee BSN, RN  Phone Number: [phone]

## 2023-12-10 NOTE — Evaluation (Signed)
 Physical Therapy Evaluation Patient Details Name: Jennifer [name] MRN: [id number] DOB: [birth year] Today's Date: 12/10/2023  History of Present Illness  Jennifer [name] is a 68 y.o. presenting to [facility] ED on 6/26 with 2 recent hospitalizations due to AMS and hypoxia. Pt diagnosed with sepsis with acute hypoxic respiratory failure without septic shock. PMH:  hypertension, CAD, CVA, diabetes mellitus type 2, ESRD on HD, hypothyroidism, anxiety, depression, and memory loss, altered mental status and hypoglycemia.  Clinical Impression  Pt is presenting below her prior level of function to previous 2 hospitalizations but close to her baseline since she has been at rehab. Pt currently is max A to Mod +2 for bed mobility, 2 person mod A for sit to stand and transfers with RW. Pt has difficulty with motor planning and has very slow processing. Due to pt current functional status, home set up and available assistance at home recommending skilled physical therapy services < 3 hours/day in order to address strength, balance and functional mobility to decrease risk for falls, injury, immobility, skin break down and re-hospitalization.          If plan is discharge home, recommend the following: A lot of help with walking and/or transfers;Help with stairs or ramp for entrance;Assistance with cooking/housework;Supervision due to cognitive status   Can travel by private vehicle   No    Equipment Recommendations Hospital bed;Hoyer lift;Wheelchair (measurements PT);Wheelchair cushion (measurements PT);Other (comment)     Functional Status Assessment Patient has had a recent decline in their functional status and demonstrates the ability to make significant improvements in function in a reasonable and predictable amount of time.     Precautions / Restrictions Precautions Precautions: Fall Recall of Precautions/Restrictions: Impaired Restrictions Weight Bearing Restrictions Per Provider Order: No       Mobility  Bed Mobility Overal bed mobility: Needs Assistance Bed Mobility: Supine to Sit, Sit to Supine       Sit to supine: Mod assist, +2 for physical assistance   General bed mobility comments: Max A with heavy cues for initiation for supine to sitting and Mod A with trunk, bil LE for sitting to supine.    Transfers Overall transfer level: Needs assistance Equipment used: Rolling walker (2 wheels) Transfers: Sit to/from Stand, Bed to chair/wheelchair/BSC Sit to Stand: Mod assist, +2 physical assistance   Step pivot transfers: Mod assist, From elevated surface, +2 physical assistance, +2 safety/equipment       General transfer comment: very unsteady requiring heavy multi modal cues for sequencing and safety with step pivot transfer and pt trying to sit early both to/from [facility] to EOB    Ambulation/Gait       Pre-gait activities: pt was able to take steps with heavy verbal cues for sequencing at 2 person mod A        Balance Overall balance assessment: Needs assistance Sitting-balance support: Feet supported, No upper extremity supported Sitting balance-Leahy Scale: Fair Sitting balance - Comments: fair/good seated at EOB and on BSC   Standing balance support: Bilateral upper extremity supported, Reliant on assistive device for balance, During functional activity Standing balance-Leahy Scale: Poor Standing balance comment: requires external support         Pertinent Vitals/Pain Pain Assessment Pain Assessment: Faces Faces Pain Scale: Hurts little more Breathing: normal Negative Vocalization: occasional moan/groan, low speech, negative/disapproving quality Facial Expression: smiling or inexpressive Body Language: relaxed Consolability: no need to console PAINAD Score: 1 Pain Location: low back after standing Pain Descriptors / Indicators: Discomfort, Sore  Pain Intervention(s): Monitored during session    Home Living Family/patient expects to be  discharged to:: Private residence Living Arrangements: Children;Spouse/significant other Available Help at Discharge: Family;Available 24 hours/day Type of Home: House Home Access: Ramped entrance       Home Layout: One level Home Equipment: Agricultural consultant (2 wheels);Cane - single point;BSC/3in1;Shower seat;Hospital bed;Wheelchair - manual;Grab bars - tub/shower Additional Comments: Pt was at [employer] prior to discharge.    Prior Function Prior Level of Function : Needs assist       Physical Assist : ADLs (physical);Mobility (physical) Mobility (physical): Transfers;Gait;Stairs;Bed mobility   Mobility Comments: (P) pt was getting assistance for mobility at [facility]. ADLs Comments: (P) getting assistance with ADLs at [facility] Assessment   Upper Extremity Assessment Upper Extremity Assessment: Defer to OT evaluation    Lower Extremity Assessment Lower Extremity Assessment: Generalized weakness    Cervical / Trunk Assessment Cervical / Trunk Assessment: Kyphotic  Communication   Communication Communication: Impaired Factors Affecting Communication: Difficulty expressing self    Cognition Arousal: Alert Behavior During Therapy: Flat affect   PT - Cognitive impairments: Orientation   Orientation impairments: Place, Time, Situation     Following commands: Impaired Following commands impaired: Follows one step commands inconsistently     Cueing Cueing Techniques: Verbal cues, Tactile cues     General Comments General comments (skin integrity, edema, etc.): daughters present during part of session        Assessment/Plan    PT Assessment Patient needs continued PT services  PT Problem List Decreased strength;Decreased activity tolerance;Decreased balance;Decreased mobility       PT Treatment Interventions DME instruction;Functional mobility training;Therapeutic activities;Therapeutic exercise;Balance training;Patient/family  education;Wheelchair mobility training;Gait training    PT Goals (Current goals can be found in the Care Plan section)  Acute Rehab PT Goals Patient Stated Goal: To improve mobility for pt to return home. PT Goal Formulation: With patient/family Time For Goal Achievement: 12/24/23 Potential to Achieve Goals: Fair    Frequency Min 1X/week     Co-evaluation PT/OT/SLP Co-Evaluation/Treatment: Yes Reason for Co-Treatment: Complexity of the patient's impairments (multi-system involvement);For patient/therapist safety PT goals addressed during session: Mobility/safety with mobility;Balance;Proper use of DME OT goals addressed during session: ADL's and self-care       AM-PAC PT 6 Clicks Mobility  Outcome Measure Help needed turning from your back to your side while in a flat bed without using bedrails?: A Lot Help needed moving from lying on your back to sitting on the side of a flat bed without using bedrails?: A Lot Help needed moving to and from a bed to a chair (including a wheelchair)?: Total Help needed standing up from a chair using your arms (e.g., wheelchair or bedside chair)?: Total Help needed to walk in hospital room?: Total Help needed climbing 3-5 steps with a railing? : Total 6 Click Score: 8    End of Session Equipment Utilized During Treatment: Gait belt Activity Tolerance: Patient tolerated treatment well;Patient limited by fatigue Patient left: in bed;with call bell/phone within reach;with bed alarm set;with family/visitor present Nurse Communication: Mobility status PT Visit Diagnosis: Unsteadiness on feet (R26.81);Other abnormalities of gait and mobility (R26.89);Muscle weakness (generalized) (M62.81)    Time: 1330-1404 PT Time Calculation (min) (ACUTE ONLY): 34 min   Charges:   PT Evaluation $PT Eval Low Complexity: 1 Low   PT General Charges $$ ACUTE PT VISIT: 1 Visit         Dorothyann Maier, DPT, CLT  Acute Rehabilitation  Services Office:  [phone] (Secure chat preferred)   Dorothyann VEAR Maier 12/10/2023, 2:57 PM

## 2023-12-10 NOTE — Plan of Care (Signed)
  Problem: Education: Goal: Ability to describe self-care measures that may prevent or decrease complications (Diabetes Survival Skills Education) will improve Outcome: Progressing   Problem: Coping: Goal: Ability to adjust to condition or change in health will improve Outcome: Progressing   Problem: Fluid Volume: Goal: Ability to maintain a balanced intake and output will improve Outcome: Progressing   Problem: Health Behavior/Discharge Planning: Goal: Ability to identify and utilize available resources and services will improve Outcome: Progressing Goal: Ability to manage health-related needs will improve Outcome: Progressing   Problem: Metabolic: Goal: Ability to maintain appropriate glucose levels will improve Outcome: Progressing   Problem: Nutritional: Goal: Maintenance of adequate nutrition will improve Outcome: Progressing Goal: Progress toward achieving an optimal weight will improve Outcome: Progressing   Problem: Skin Integrity: Goal: Risk for impaired skin integrity will decrease Outcome: Progressing   Problem: Tissue Perfusion: Goal: Adequacy of tissue perfusion will improve Outcome: Progressing   Problem: Education: Goal: Knowledge of General Education information will improve Description: Including pain rating scale, medication(s)/side effects and non-pharmacologic comfort measures Outcome: Progressing   Problem: Health Behavior/Discharge Planning: Goal: Ability to manage health-related needs will improve Outcome: Progressing   Problem: Clinical Measurements: Goal: Ability to maintain clinical measurements within normal limits will improve Outcome: Progressing Goal: Will remain free from infection Outcome: Progressing Goal: Diagnostic test results will improve Outcome: Progressing Goal: Respiratory complications will improve Outcome: Progressing Goal: Cardiovascular complication will be avoided Outcome: Progressing   Problem: Activity: Goal:  Risk for activity intolerance will decrease Outcome: Progressing   Problem: Nutrition: Goal: Adequate nutrition will be maintained Outcome: Progressing   Problem: Coping: Goal: Level of anxiety will decrease Outcome: Progressing   Problem: Elimination: Goal: Will not experience complications related to bowel motility Outcome: Progressing Goal: Will not experience complications related to urinary retention Outcome: Progressing   Problem: Pain Managment: Goal: General experience of comfort will improve and/or be controlled Outcome: Progressing   Problem: Safety: Goal: Ability to remain free from injury will improve Outcome: Progressing   Problem: Skin Integrity: Goal: Risk for impaired skin integrity will decrease Outcome: Progressing   Problem: Education: Goal: Knowledge of disease and its progression will improve Outcome: Progressing Goal: Individualized Educational Video(s) Outcome: Progressing   Problem: Fluid Volume: Goal: Compliance with measures to maintain balanced fluid volume will improve Outcome: Progressing   Problem: Health Behavior/Discharge Planning: Goal: Ability to manage health-related needs will improve Outcome: Progressing   Problem: Nutritional: Goal: Ability to make healthy dietary choices will improve Outcome: Progressing   Problem: Clinical Measurements: Goal: Complications related to the disease process, condition or treatment will be avoided or minimized Outcome: Progressing

## 2023-12-10 NOTE — Progress Notes (Signed)
 PROGRESS NOTE    Jennifer [name]  FMW:[id number] DOB: 06/11/1956 DOA: 12/07/2023 PCP: Randeen Laine LABOR, MD   Brief Narrative:  HPI per PCCM Team:  68 yo female recently discharged from [facility] 6/9-6/12 after acute cva and subsequently represented with similar symptoms as this presentation (hypothermia/hypoglycemia/hypoxia and worsening mental status d/c'd 6/23). She was noted to have pneumonia (LLL) and acute pe started on eliquis . She was placed back in rehab. Today she was reportedly in therapy and became unresponsive. She was hypoxic to 77 on RA and hypoglycemic to 40's. Per daughter pt has been participating in her usual dialysis but most recently had only partial session as pt has become more confused with memory loss and delirium that she pulled out her needles.     She has not been eating or drinking well per the family at bedside and does have low albumin  on lab analysis. She was previously on midodrine at baseline but daughter states was taken off as pt was quite responsive to it and became hypertensive with it.   TSH within normal range. Hgb stable. CT chest with small pericardial effusion, no acute pulmonary changes, still with LLL pneumonia. Lactate stable. No elevated wbc but is hypothermic 95.6, despite ~1L of fluid pt remained hypotensive and was started on levo. BS has improved with amp of d50. Pt denies any complaints with exception of being tired.    Ccm was asked to admit for vasopressor titration.    D/w was had with family about cvc placement. At this time we will defer this as anticipate improvement with resuming midodrine  **Interim History She is been weaned off of pressors and underwent hemodialysis yesterday.  Mentation is better per nursing and will obtain PT OT evaluation.  Current plan is for antibiotics with IV cefepime  for 5 days.  Assessment and Plan:  Metabolic Encephalopathy: Unclear baseline.  Suspect poor nutrition failure to thrive mild hypotension possible UTI.  Minimize central acting meds. Improving overall with hydration and time and treatment of possible infection. Delirium Precautions    Hypotension, presumed septic shock: With source is possible UTI given recent history of the same as well as her report of dysuria (although hard to cooperate given her AMS). C/w MAP goal greater than 65, weaned off norepinephrine  6/27; C/w Midodrine 10 mg po TID. Continuing IV Abc with Cefepime  and planning on 5 days Tx. UA with leukocytes, pyuria, although there are some squamous cells so may not be accurate   History of dementia: Unclear if encephalopathy is far from baseline, overall seems improved. Continue home Memantine  10 mg po BID   Hypothyroidism: Check TSH in the AM. C/w Levothyroxine  175 mcg po Daily  History of PE: Continue home Apixaban  5 mg po BID  Hx of CVA with L Sided Deficits: Resume ASA 81 mg po Daily and Atorvastatin  80 mg po at bedtime. PT/OT to evaluate and Treat  HLD: C/w Atorvastatin  80 mg po Daily and Ezetimibe  10 mg po at bedtime  Diabetes Mellitus Type 2: Her Home Novology and Humalog  Mix currently being held. Will place on Very Sensitive Novolog  SSI AC.   Depression and Anxiety: C/w Bupropion  150 mg po BID and will resume Duloxetine  60 mg po Daily  ESRD on HD: BUN/Cr Trend: Recent Labs  Lab 11/30/23 0608 12/01/23 0731 12/04/23 1633 12/07/23 1810 12/08/23 0405 12/09/23 0251 12/10/23 0835  BUN 27* 46* 58* 29* 34* 44* 25*  CREATININE 4.54* 5.89* 7.20* 4.72* 5.83* 6.95* 4.73*  -C/w Sevelamer  Carbonate 0.8 grams  TIDwm -Avoid Nephrotoxic Medications, Contrast Dyes, Hypotension and Dehydration to Ensure Adequate Renal Perfusion and will need to Renally Adjust Meds. CTM and Trend Renal Function carefully and repeat CMP in the AM   Normocytic Anemia/Anemia of Chronic Kidney Disease: Hgb/Hct Trend:  Recent Labs  Lab 11/29/23 0548 11/30/23 0608 12/01/23 0731 12/04/23 1633 12/07/23 1616 12/07/23 1700 12/08/23 0405  HGB 8.4*  9.1* 8.2* 8.2* 8.2* 8.3* 7.9*  HCT 27.3* 30.1* 27.0* 26.3* 24.0* 26.9* 25.1*  MCV 100.0 102.0* 99.3 99.2  --  100.0 98.0  -Will resume her home Ferrous Sulfate  325 mg  po Daily -Check Anemia Panel in the AM. CTM for S/Sx of Bleeding; No overt bleeding noted. Repeat CBC in the AM  Hypoalbuminemia: Patient's Albumin  Trend: Recent Labs  Lab 11/27/23 0343 11/28/23 0532 11/29/23 0548 11/30/23 0608 12/01/23 0731 12/04/23 1633 12/07/23 1810  ALBUMIN  2.2* 2.3* 2.1* 2.3* 2.3* 2.4* 2.0*  -CTM and Trend and repeat CMP in the AM  Class I Obesity: Complicates overall prognosis and care. Estimated body mass index is 31.08 kg/m as calculated from the following:   Height as of this encounter: 5' 7 (1.702 m).   Weight as of this encounter: 90 kg. Weight Loss and Dietary Counseling given   DVT prophylaxis: SCDs Start: 12/08/23 0034 apixaban  (ELIQUIS ) tablet 5 mg    Code Status: Full Code Family Communication: Discussed with husband at bedside  Disposition Plan:  Level of care: Telemetry Medical Status is: Inpatient Remains inpatient appropriate because: Needs further clinical improvement and evaluation by PT/OT   Consultants:  PCCM Transfer Nephrology  Procedures:  As delineated as above  Antimicrobials:  Anti-infectives (From admission, onward)    Start     Dose/Rate Route Frequency Ordered Stop   12/08/23 1000  ceFEPIme  (MAXIPIME ) 1 g in sodium chloride  0.9 % 100 mL IVPB        1 g 200 mL/hr over 30 Minutes Intravenous Daily 12/08/23 0156 12/12/23 1759   12/07/23 1645  ceFEPIme  (MAXIPIME ) 1 g in sodium chloride  0.9 % 100 mL IVPB        1 g 200 mL/hr over 30 Minutes Intravenous  Once 12/07/23 1642 12/07/23 1810   12/07/23 1645  vancomycin  (VANCOREADY) IVPB 2000 mg/400 mL        2,000 mg 200 mL/hr over 120 Minutes Intravenous  Once 12/07/23 1642 12/07/23 2103       Subjective: Seen and examined at bedside and she is eating and denied any complaints.  No nausea or vomiting.   Feels okay.  No other concerns or complaints at this time.  Objective: Vitals:   12/09/23 2300 12/10/23 0430 12/10/23 0812 12/10/23 1000  BP: (!) 136/54 112/69 (!) 125/51   Pulse: 77 77 70   Resp: 17 16 14 16   Temp: 98.2 F (36.8 C) 97.6 F (36.4 C) 97.8 F (36.6 C)   TempSrc: Oral Axillary Oral   SpO2: 95% 94% 98% 100%  Weight:      Height:        Intake/Output Summary (Last 24 hours) at 12/10/2023 1541 Last data filed at 12/10/2023 1215 Gross per 24 hour  Intake 540 ml  Output 2125 ml  Net -1585 ml   Filed Weights   12/09/23 0600 12/09/23 1357 12/09/23 1720  Weight: 90.1 kg 91.9 kg 90 kg   Examination: Physical Exam:  Constitutional: Obese chronically ill-appearing Caucasian female no acute distress Respiratory: Diminished to auscultation bilaterally, no wheezing, rales, rhonchi or crackles. Normal respiratory effort and patient is  not tachypenic. No accessory muscle use.  Unlabored breathing Cardiovascular: RRR, no murmurs / rubs / gallops. S1 and S2 auscultated.  Mild 1+ extremity edema Abdomen: Soft, non-tender, distended secondary to body habitus bowel sounds positive.  GU: Deferred. Musculoskeletal: No clubbing / cyanosis of digits/nails. No joint deformity upper and lower extremities.  Has a right upper arm AV fistula Skin: No rashes, lesions, ulcers on a limited skin evaluation.  No induration; Warm and dry.  Neurologic: CN 2-12 grossly intact with no focal deficits. Romberg sign cerebellar reflexes not assessed.  Psychiatric: She is awake and alert  Data Reviewed: I have personally reviewed following labs and imaging studies  CBC: Recent Labs  Lab 12/04/23 1633 12/07/23 1616 12/07/23 1700 12/08/23 0405  WBC 8.2  --  7.0 15.4*  NEUTROABS  --   --  5.5  --   HGB 8.2* 8.2* 8.3* 7.9*  HCT 26.3* 24.0* 26.9* 25.1*  MCV 99.2  --  100.0 98.0  PLT 325  --  358 352   Basic Metabolic Panel: Recent Labs  Lab 12/04/23 1633 12/07/23 1616 12/07/23 1810  12/08/23 0405 12/09/23 0251 12/10/23 0835  NA 137 138 139 138 135 136  K 4.5 4.1 3.4* 3.7 3.9 3.8  CL 97*  --  110 97* 97* 96*  CO2 24  --  25 23 27 26   GLUCOSE 130*  --  102* 145* 80 92  BUN 58*  --  29* 34* 44* 25*  CREATININE 7.20*  --  4.72* 5.83* 6.95* 4.73*  CALCIUM  8.8*  --  7.0* 8.7* 8.3* 8.3*  MG  --   --   --  1.8  --   --   PHOS 7.6*  --   --   --   --  4.2   GFR: Estimated Creatinine Clearance: 13.1 mL/min (A) (by C-G formula based on SCr of 4.73 mg/dL (H)). Liver Function Tests: Recent Labs  Lab 12/04/23 1633 12/07/23 1810  AST  --  17  ALT  --  15  ALKPHOS  --  41  BILITOT  --  0.8  PROT  --  4.6*  ALBUMIN  2.4* 2.0*   No results for input(s): LIPASE, AMYLASE in the last 168 hours. No results for input(s): AMMONIA in the last 168 hours. Coagulation Profile: Recent Labs  Lab 12/07/23 1806  INR 1.8*   Cardiac Enzymes: No results for input(s): CKTOTAL, CKMB, CKMBINDEX, TROPONINI in the last 168 hours. BNP (last 3 results) No results for input(s): PROBNP in the last 8760 hours. HbA1C: No results for input(s): HGBA1C in the last 72 hours. CBG: Recent Labs  Lab 12/09/23 1934 12/09/23 2256 12/10/23 0426 12/10/23 0811 12/10/23 1137  GLUCAP 88 130* 96 89 172*   Lipid Profile: No results for input(s): CHOL, HDL, LDLCALC, TRIG, CHOLHDL, LDLDIRECT in the last 72 hours. Thyroid  Function Tests: Recent Labs    12/07/23 1700 12/07/23 1810  TSH 4.223  --   FREET4  --  1.19*   Anemia Panel: No results for input(s): VITAMINB12, FOLATE, FERRITIN, TIBC, IRON, RETICCTPCT in the last 72 hours. Sepsis Labs: Recent Labs  Lab 12/07/23 1714  LATICACIDVEN 0.7   Recent Results (from the past 240 hours)  Blood Culture (routine x 2)     Status: None (Preliminary result)   Collection Time: 12/07/23  5:00 PM   Specimen: BLOOD LEFT HAND  Result Value Ref Range Status   Specimen Description BLOOD LEFT HAND  Final    Special Requests  Final    BOTTLES DRAWN AEROBIC AND ANAEROBIC Blood Culture results may not be optimal due to an inadequate volume of blood received in culture bottles   Culture   Final    NO GROWTH 3 DAYS Performed at [facility], [address] [place], [place] [address]    Report Status PENDING  Incomplete  Blood Culture (routine x 2)     Status: None (Preliminary result)   Collection Time: 12/07/23  5:19 PM   Specimen: BLOOD LEFT WRIST  Result Value Ref Range Status   Specimen Description BLOOD LEFT WRIST  Final   Special Requests   Final    BOTTLES DRAWN AEROBIC ONLY Blood Culture results may not be optimal due to an inadequate volume of blood received in culture bottles   Culture   Final    NO GROWTH 3 DAYS Performed at [facility], [address], [place], [place] [address]    Report Status PENDING  Incomplete  MRSA Next Gen by PCR, Nasal     Status: None   Collection Time: 12/08/23 12:34 AM   Specimen: Nasal Mucosa; Nasal Swab  Result Value Ref Range Status   MRSA by PCR Next Gen NOT DETECTED NOT DETECTED Final    Comment: (NOTE) The GeneXpert MRSA Assay (FDA approved for NASAL specimens only), is one component of a comprehensive MRSA colonization surveillance program. It is not intended to diagnose MRSA infection nor to guide or monitor treatment for MRSA infections. Test performance is not FDA approved in patients less than 92 years old. Performed at [facility], [address] [place], [place] [address]     Radiology Studies: No results found.  Scheduled Meds:  apixaban   5 mg Oral BID   aspirin  EC  81 mg Oral Daily   atorvastatin   80 mg Oral QPM   buPROPion   150 mg Oral BID WC   Chlorhexidine  Gluconate Cloth  6 each Topical Daily   Chlorhexidine  Gluconate Cloth  6 each Topical Q0600   DULoxetine   60 mg Oral Daily   ezetimibe   10 mg Oral QPM   insulin  aspart  0-5 Units Subcutaneous QHS   insulin  aspart  0-6 Units Subcutaneous TID WC    levothyroxine   175 mcg Oral Q0600   memantine   10 mg Oral BID   midodrine  10 mg Oral TID WC   sevelamer  carbonate  0.8 g Oral TID WC   Continuous Infusions:  ceFEPime  (MAXIPIME ) IV Stopped (12/09/23 1829)    LOS: 2 days   Alejandro Marker, DO [facility] Available via Epic secure chat 7am-7pm After these hours, please refer to coverage provider listed on [email or web address] 12/10/2023, 3:41 PM

## 2023-12-10 NOTE — Hospital Course (Signed)
 HPI per PCCM Team:  68 yo female recently discharged from [facility] 6/9-6/12 after acute cva and subsequently represented with similar symptoms as this presentation (hypothermia/hypoglycemia/hypoxia and worsening mental status d/c'd 6/23). She was noted to have pneumonia (LLL) and acute pe started on eliquis . She was placed back in rehab. Today she was reportedly in therapy and became unresponsive. She was hypoxic to 77 on RA and hypoglycemic to 40's. Per daughter pt has been participating in her usual dialysis but most recently had only partial session as pt has become more confused with memory loss and delirium that she pulled out her needles.     She has not been eating or drinking well per the family at bedside and does have low albumin  on lab analysis. She was previously on midodrine at baseline but daughter states was taken off as pt was quite responsive to it and became hypertensive with it.   TSH within normal range. Hgb stable. CT chest with small pericardial effusion, no acute pulmonary changes, still with LLL pneumonia. Lactate stable. No elevated wbc but is hypothermic 95.6, despite ~1L of fluid pt remained hypotensive and was started on levo. BS has improved with amp of d50. Pt denies any complaints with exception of being tired.    Ccm was asked to admit for vasopressor titration.    D/w was had with family about cvc placement. At this time we will defer this as anticipate improvement with resuming midodrine  **Interim History She is been weaned off of pressors and underwent hemodialysis yesterday.  Mentation is better per nursing and will obtain PT OT evaluation.  Current plan is for antibiotics with IV cefepime  for 5 days.  Assessment and Plan:  Metabolic Encephalopathy: Unclear baseline.  Suspect poor nutrition failure to thrive mild hypotension possible UTI. Minimize central acting meds. Improving overall with hydration and time and treatment of possible infection. Delirium Precautions     Hypotension, presumed septic shock: With source is possible UTI given recent history of the same as well as her report of dysuria (although hard to cooperate given her AMS). C/w MAP goal greater than 65, weaned off norepinephrine  6/27; C/w Midodrine 10 mg po TID. Continuing IV Abc with Cefepime  and planning on 5 days Tx. UA with leukocytes, pyuria, although there are some squamous cells so may not be accurate   History of dementia: Unclear if encephalopathy is far from baseline, overall seems improved. Continue home Memantine  10 mg po BID   Hypothyroidism: Check TSH in the AM. C/w Levothyroxine  175 mcg po Daily  History of PE: Continue home Apixaban  5 mg po BID  Hx of CVA with L Sided Deficits: Resume ASA 81 mg po Daily and Atorvastatin  80 mg po at bedtime. PT/OT to evaluate and Treat  HLD: C/w Atorvastatin  80 mg po Daily and Ezetimibe  10 mg po at bedtime  Diabetes Mellitus Type 2: Her Home Novology and Humalog  Mix currently being held. Will place on Very Sensitive Novolog  SSI AC.   Depression and Anxiety: C/w Bupropion  150 mg po BID and will resume Duloxetine  60 mg po Daily  ESRD on HD: BUN/Cr Trend: Recent Labs  Lab 11/30/23 0608 12/01/23 0731 12/04/23 1633 12/07/23 1810 12/08/23 0405 12/09/23 0251 12/10/23 0835  BUN 27* 46* 58* 29* 34* 44* 25*  CREATININE 4.54* 5.89* 7.20* 4.72* 5.83* 6.95* 4.73*  -C/w Sevelamer  Carbonate 0.8 grams TIDwm -Avoid Nephrotoxic Medications, Contrast Dyes, Hypotension and Dehydration to Ensure Adequate Renal Perfusion and will need to Renally Adjust Meds. CTM and Trend  Renal Function carefully and repeat CMP in the AM   Normocytic Anemia/Anemia of Chronic Kidney Disease: Hgb/Hct Trend:  Recent Labs  Lab 11/29/23 0548 11/30/23 0608 12/01/23 0731 12/04/23 1633 12/07/23 1616 12/07/23 1700 12/08/23 0405  HGB 8.4* 9.1* 8.2* 8.2* 8.2* 8.3* 7.9*  HCT 27.3* 30.1* 27.0* 26.3* 24.0* 26.9* 25.1*  MCV 100.0 102.0* 99.3 99.2  --  100.0 98.0  -Will  resume her home Ferrous Sulfate  325 mg  po Daily -Check Anemia Panel in the AM. CTM for S/Sx of Bleeding; No overt bleeding noted. Repeat CBC in the AM  Hypoalbuminemia: Patient's Albumin  Trend: Recent Labs  Lab 11/27/23 0343 11/28/23 0532 11/29/23 0548 11/30/23 0608 12/01/23 0731 12/04/23 1633 12/07/23 1810  ALBUMIN  2.2* 2.3* 2.1* 2.3* 2.3* 2.4* 2.0*  -CTM and Trend and repeat CMP in the AM  Class I Obesity: Complicates overall prognosis and care. Estimated body mass index is 31.08 kg/m as calculated from the following:   Height as of this encounter: 5' 7 (1.702 m).   Weight as of this encounter: 90 kg. Weight Loss and Dietary Counseling given

## 2023-12-10 NOTE — Progress Notes (Signed)
 Fissure 1 cm x .1cm  black

## 2023-12-11 ENCOUNTER — Telehealth: Payer: Self-pay

## 2023-12-11 DIAGNOSIS — R579 Shock, unspecified: Secondary | ICD-10-CM | POA: Diagnosis not present

## 2023-12-11 DIAGNOSIS — N186 End stage renal disease: Secondary | ICD-10-CM | POA: Diagnosis not present

## 2023-12-11 DIAGNOSIS — G9340 Encephalopathy, unspecified: Secondary | ICD-10-CM | POA: Diagnosis not present

## 2023-12-11 LAB — COMPREHENSIVE METABOLIC PANEL WITH GFR
ALT: 10 U/L (ref 0–44)
AST: 11 U/L — ABNORMAL LOW (ref 15–41)
Albumin: 2.6 g/dL — ABNORMAL LOW (ref 3.5–5.0)
Alkaline Phosphatase: 41 U/L (ref 38–126)
Anion gap: 11 (ref 5–15)
BUN: 45 mg/dL — ABNORMAL HIGH (ref 8–23)
CO2: 26 mmol/L (ref 22–32)
Calcium: 8.5 mg/dL — ABNORMAL LOW (ref 8.9–10.3)
Chloride: 98 mmol/L (ref 98–111)
Creatinine, Ser: 6.65 mg/dL — ABNORMAL HIGH (ref 0.44–1.00)
GFR, Estimated: 6 mL/min — ABNORMAL LOW (ref >60)
Glucose, Bld: 126 mg/dL — ABNORMAL HIGH (ref 70–99)
Potassium: 4.6 mmol/L (ref 3.5–5.1)
Sodium: 135 mmol/L (ref 135–145)
Total Bilirubin: 0.6 mg/dL (ref 0.0–1.2)
Total Protein: 5.4 g/dL — ABNORMAL LOW (ref 6.5–8.1)

## 2023-12-11 LAB — CBC WITH DIFFERENTIAL/PLATELET
Abs Immature Granulocytes: 0.09 10*3/uL — ABNORMAL HIGH (ref 0.00–0.07)
Basophils Absolute: 0.1 10*3/uL (ref 0.0–0.1)
Basophils Relative: 1 %
Eosinophils Absolute: 0.2 10*3/uL (ref 0.0–0.5)
Eosinophils Relative: 2 %
HCT: 25 % — ABNORMAL LOW (ref 36.0–46.0)
Hemoglobin: 7.8 g/dL — ABNORMAL LOW (ref 12.0–15.0)
Immature Granulocytes: 1 %
Lymphocytes Relative: 28 %
Lymphs Abs: 2 10*3/uL (ref 0.7–4.0)
MCH: 31.2 pg (ref 26.0–34.0)
MCHC: 31.2 g/dL (ref 30.0–36.0)
MCV: 100 fL (ref 80.0–100.0)
Monocytes Absolute: 0.6 10*3/uL (ref 0.1–1.0)
Monocytes Relative: 8 %
Neutro Abs: 4.5 10*3/uL (ref 1.7–7.7)
Neutrophils Relative %: 60 %
Platelets: 381 10*3/uL (ref 150–400)
RBC: 2.5 MIL/uL — ABNORMAL LOW (ref 3.87–5.11)
RDW: 15.9 % — ABNORMAL HIGH (ref 11.5–15.5)
WBC: 7.4 10*3/uL (ref 4.0–10.5)
nRBC: 0 % (ref 0.0–0.2)

## 2023-12-11 LAB — GLUCOSE, CAPILLARY
Glucose-Capillary: 132 mg/dL — ABNORMAL HIGH (ref 70–99)
Glucose-Capillary: 135 mg/dL — ABNORMAL HIGH (ref 70–99)
Glucose-Capillary: 103 mg/dL — ABNORMAL HIGH (ref 70–99)
Glucose-Capillary: 138 mg/dL — ABNORMAL HIGH (ref 70–99)

## 2023-12-11 LAB — MAGNESIUM: Magnesium: 2.1 mg/dL (ref 1.7–2.4)

## 2023-12-11 LAB — PHOSPHORUS: Phosphorus: 5.3 mg/dL — ABNORMAL HIGH (ref 2.5–4.6)

## 2023-12-11 MED ORDER — LIDOCAINE HCL (PF) 1 % IJ SOLN: 5.0000 mL | INTRAMUSCULAR | Status: DC | PRN

## 2023-12-11 MED ORDER — PENTAFLUOROPROP-TETRAFLUOROETH EX AERO: 1.0000 | INHALATION_SPRAY | CUTANEOUS | Status: DC | PRN

## 2023-12-11 MED ORDER — PROSOURCE PLUS PO LIQD
30.0000 mL | Freq: Two times a day (BID) | ORAL | Status: DC
  Administered 2023-12-11 – 2023-12-12 (×3): 30 mL via ORAL
  Filled 2023-12-11 (×3): qty 30

## 2023-12-11 MED ORDER — ANTICOAGULANT SODIUM CITRATE 4% (200MG/5ML) IV SOLN: 5.0000 mL | Status: DC | PRN

## 2023-12-11 MED ORDER — HEPARIN SODIUM (PORCINE) 1000 UNIT/ML DIALYSIS: 1000.0000 [IU] | INTRAMUSCULAR | Status: DC | PRN

## 2023-12-11 MED ORDER — NEPRO/CARBSTEADY PO LIQD: 237.0000 mL | ORAL | Status: DC | PRN

## 2023-12-11 MED ORDER — LIDOCAINE-PRILOCAINE 2.5-2.5 % EX CREA: 1.0000 | TOPICAL_CREAM | CUTANEOUS | Status: DC | PRN

## 2023-12-11 NOTE — TOC Progression Note (Signed)
 Transition of Care [facility]) - Progression Note    Patient Details  Name: Jennifer [name] MRN: [id number] Date of Birth: 1955-08-05  Transition of Care [facility]) CM/SW Contact  Tom-Johnson, Harvest Muskrat, RN Phone Number: 12/11/2023, 3:55 PM  Clinical Narrative:     Home health referral called in to [facility] per daughter Shannon's request, [facility] voiced acceptance, info on AVS. Hoyer Lift ordered from [employer] and [facility] to deliver to patient's home.  Patient not Medically ready for discharge.  CM will continue to follow as patient progresses with care towards discharge.          Expected Discharge Plan: Home w Home Health Services Barriers to Discharge: Continued Medical Work up  Expected Discharge Plan and Services In-house Referral: Clinical Social Work Discharge Planning Services: CM Consult Post Acute Care Choice: Home Health, Durable Medical Equipment Living arrangements for the past 2 months: Single Family Home                 DME Arranged: Other see comment Lajean lift) DME Agency: [facility] Date DME Agency Contacted: 12/11/23 Time DME Agency Contacted: [phone] Representative spoke with at DME Agency: [facility] HH Arranged: PT, OT, RN, Nurse's Aide, Social Work [employer] Agency: [facility] Date HH Agency Contacted: 12/11/23 Time HH Agency Contacted: [phone] Representative spoke with at [facility] Agency: [facility]   Social Determinants of Health (SDOH) Interventions SDOH Screenings   Food Insecurity: No Food Insecurity (11/21/2023)  Housing: Low Risk  (11/21/2023)  Transportation Needs: No Transportation Needs (11/21/2023)  Utilities: Not At Risk (11/21/2023)  Alcohol  Screen: Low Risk  (03/07/2023)  Depression (PHQ2-9): High Risk (05/30/2023)  Financial Resource Strain: Low Risk  (03/07/2023)  Physical Activity: Inactive (03/07/2023)  Social Connections: Socially Integrated (11/21/2023)  Stress: No Stress Concern Present (03/07/2023)  Tobacco Use: Medium Risk  (12/07/2023)  Health Literacy: Adequate Health Literacy (03/07/2023)    Readmission Risk Interventions    12/11/2023    1:59 PM  Readmission Risk Prevention Plan  Transportation Screening Complete  Medication Review (RN Care Manager) Complete  PCP or Specialist appointment within 3-5 days of discharge Complete  HRI or Home Care Consult Complete  SW Recovery Care/Counseling Consult Complete  Palliative Care Screening Not Applicable  Skilled Nursing Facility Patient Refused

## 2023-12-11 NOTE — TOC Initial Note (Addendum)
 Transition of Care [facility]) - Initial/Assessment Note    Patient Details  Name: Jennifer [name] MRN: [id number] Date of Birth: May 16, 1956  Transition [facility]) CM/SW Contact:    Inocente GORMAN Kindle, LCSW Phone Number: 12/11/2023, 2:01 PM  Clinical Narrative:                 CSW spoke with patient's daughter, Jennifer [name], regarding discharge plan and potential return to [place]. Patient's daughter reported that they would like home health services instead of returning to SNF. CSW offered to locate a different SNF but daughter reports that patent has been begging to return home and has been getting more and more confused while at [facility].  Patient's daughter reports preference for [facility] since she has used them before and liked their care.  CSW discussed equipment needs and she requested a hoyer lift. Patient has a hospital bed, transport chair, shower bench, BSC, grab bars, and rolling walker. CSW confirmed PCP and address. Jennifer [name] requests patient resume her home Dialysis. CSW updated renal team to switch from OP Dialysis to home where patient has the equipment. Daughter will speak with her family to determine if they need PTAR for transport home. Jennifer [name] requested an outpatient palliative consult; does not have agency preference. CSW sent referral to [facility].    Expected Discharge Plan: Home w Home Health Services Barriers to Discharge: Continued Medical Work up   Patient Goals and CMS Choice Patient states their goals for this hospitalization and ongoing recovery are:: Return home CMS Medicare.gov Compare Post Acute Care list provided to:: Patient Represenative (must comment) Choice offered to / list presented to : Adult Children [place] ownership interest in [facility].provided to:: Adult Children    Expected Discharge Plan and Services In-house Referral: Clinical Social Work Discharge Planning Services: CM Consult Post Acute Care Choice: Home Health, Durable Medical  Equipment Living arrangements for the past 2 months: Single Family Home                                      Prior Living Arrangements/Services Living arrangements for the past 2 months: Single Family Home Lives with:: Adult Children, Spouse Patient language and need for interpreter reviewed:: Yes Do you feel safe going back to the place where you live?: Yes      Need for Family Participation in Patient Care: Yes (Comment) Care giver support system in place?: Yes (comment) Current home services: DME Criminal Activity/Legal Involvement Pertinent to Current Situation/Hospitalization: No - Comment as needed  Activities of Daily Living      Permission Sought/Granted Permission sought to share information with : Facility Medical sales representative, Family Supports Permission granted to share information with : No  Share Information with NAME: Jennifer [name]  Permission granted to share info w AGENCY: HH  Permission granted to share info w Relationship: Daughter  Permission granted to share info w Contact Information: [phone]  Emotional Assessment Appearance:: Appears stated age Attitude/Demeanor/Rapport: Unable to Assess Affect (typically observed): Unable to Assess Orientation: : Oriented to Self, Oriented to Place Alcohol  / Substance Use: Not Applicable Psych Involvement: No (comment)  Admission diagnosis:  Shock (HCC) [R57.9] Sepsis with acute hypoxic respiratory failure without septic shock, due to unspecified organism (HCC) [A41.9, R65.20, J96.01] Patient Active Problem List   Diagnosis Date Noted   Shock (HCC) 12/08/2023   Pulmonary embolism (HCC) 11/28/2023   Acute respiratory failure with hypoxia (HCC)  11/27/2023   Acute metabolic encephalopathy 11/27/2023   Controlled type 2 diabetes mellitus with hypoglycemia (HCC) 11/27/2023   SIRS (systemic inflammatory response syndrome) (HCC) 11/27/2023   Acute left-sided weakness 11/20/2023   Abnormal urine odor 05/30/2023    Recurrent strokes (HCC) 05/20/2023   Anemia of chronic renal failure 12/19/2022   (HFpEF) heart failure with preserved ejection fraction (HCC) 11/25/2022   Auditory hallucination 07/26/2022   Carotid artery stenosis, asymptomatic, left 05/04/2022   Anemia of chronic disease 03/23/2022   Hyponatremia 03/23/2022   Hyperkalemia 03/23/2022   Dehydration 03/23/2022   Chronic diastolic CHF (congestive heart failure) (HCC) 03/23/2022   Closed left ankle fracture 01/26/2022   Type 2 diabetes mellitus with hyperglycemia (HCC) 01/07/2022   Acute kidney injury superimposed on chronic kidney disease (HCC) 11/19/2021   Frequent urination 07/13/2021   Change in mental status 07/13/2021   Gait abnormality 07/13/2021   Memory loss 07/13/2021   Abnormal urinalysis 07/13/2021   ESRD on hemodialysis (HCC) 07/13/2021   Normocytic anemia 07/13/2021   Diabetic nephropathy (HCC) 05/28/2020   Diabetic retinopathy (HCC) 05/28/2020   Presence of insulin  pump (external) (internal) 05/28/2020   Diabetic macular edema of right eye with proliferative retinopathy associated with type 2 diabetes mellitus (HCC) 04/16/2020   Macular pucker, right eye 04/16/2020   Vitreous hemorrhage of left eye (HCC) 04/16/2020   Pre-op examination 03/13/2020   Chronic venous insufficiency 02/06/2020   Constipation 05/28/2019   S/P TKR (total knee replacement), right 03/23/2019 05/07/2019   Primary osteoarthritis of right knee 04/02/2019   Unilateral primary osteoarthritis, right knee    Pedal edema 02/25/2019   Acute vertigo with vomiting and inability to stand 10/08/2018   Uncontrolled type 2 diabetes mellitus with hyperglycemia, with long-term current use of insulin  (HCC) 10/08/2018   Former smoker 10/08/2018   Carotid artery disease (HCC) 03/15/2018   History of CVA (cerebrovascular accident) 11/16/2017   Low back pain 10/26/2017   Screening mammogram, encounter for 09/18/2017   Coronary artery disease involving native  coronary artery of native heart without angina pectoris 11/04/2015   NSTEMI (non-ST elevated myocardial infarction) (HCC) 11/03/2015   Leukocytosis 08/16/2010   Acquired hypothyroidism 07/06/2010   HLD (hyperlipidemia) 08/28/2008   Essential hypertension 11/13/2007   Proliferative diabetic retinopathy of left eye with macular edema associated with type 2 diabetes mellitus (HCC) 09/21/2006   Diabetic peripheral neuropathy (HCC) 09/21/2006   Obesity (BMI 30-39.9) 09/21/2006   Anxiety 09/21/2006   Adjustment disorder with mixed anxiety and depressed mood 09/21/2006   CARPAL TUNNEL SYNDROME 09/21/2006   COPD (chronic obstructive pulmonary disease) (HCC) 09/21/2006   EDEMA 09/21/2006   MIGRAINES, HX OF 09/21/2006   PCP:  Randeen Laine LABOR, MD Pharmacy:   [facility] - [place], [place] - [address] [place] [address] Phone: [phone] Fax: [phone]     Social Drivers of Health (SDOH) Social History: SDOH Screenings   Food Insecurity: No Food Insecurity (11/21/2023)  Housing: Low Risk  (11/21/2023)  Transportation Needs: No Transportation Needs (11/21/2023)  Utilities: Not At Risk (11/21/2023)  Alcohol  Screen: Low Risk  (03/07/2023)  Depression (PHQ2-9): High Risk (05/30/2023)  Financial Resource Strain: Low Risk  (03/07/2023)  Physical Activity: Inactive (03/07/2023)  Social Connections: Socially Integrated (11/21/2023)  Stress: No Stress Concern Present (03/07/2023)  Tobacco Use: Medium Risk (12/07/2023)  Health Literacy: Adequate Health Literacy (03/07/2023)   SDOH Interventions:     Readmission Risk Interventions    12/11/2023    1:59 PM  Readmission Risk Prevention Plan  Transportation Screening Complete  Medication Review Oceanographer) Complete  PCP or Specialist appointment within 3-5 days of discharge Complete  HRI or Home Care Consult Complete  SW Recovery Care/Counseling Consult Complete  Palliative Care  Screening Not Applicable  Skilled Nursing Facility Patient Refused

## 2023-12-11 NOTE — Progress Notes (Signed)
 PROGRESS NOTE    Jennifer [name]  FMW:[id number] DOB: [birth year] DOA: 12/07/2023 PCP: Randeen Laine LABOR, MD   Brief Narrative:  HPI per PCCM Team:  68 yo female recently discharged from [facility] 6/9-6/12 after acute cva and subsequently represented with similar symptoms as this presentation (hypothermia/hypoglycemia/hypoxia and worsening mental status d/c'd 6/23). She was noted to have pneumonia (LLL) and acute pe started on eliquis . She was placed back in rehab. Today she was reportedly in therapy and became unresponsive. She was hypoxic to 77 on RA and hypoglycemic to 40's. Per daughter pt has been participating in her usual dialysis but most recently had only partial session as pt has become more confused with memory loss and delirium that she pulled out her needles.     She has not been eating or drinking well per the family at bedside and does have low albumin  on lab analysis. She was previously on midodrine at baseline but daughter states was taken off as pt was quite responsive to it and became hypertensive with it.   TSH within normal range. Hgb stable. CT chest with small pericardial effusion, no acute pulmonary changes, still with LLL pneumonia. Lactate stable. No elevated wbc but is hypothermic 95.6, despite ~1L of fluid pt remained hypotensive and was started on levo. BS has improved with amp of d50. Pt denies any complaints with exception of being tired.    Ccm was asked to admit for vasopressor titration.    D/w was had with family about cvc placement. At this time we will defer this as anticipate improvement with resuming midodrine  **Interim History She is been weaned off of pressors and underwent hemodialysis yesterday.  Mentation is better per nursing and will obtain PT OT evaluation which is recommending SNF but patient wants to go home with home health.  Current plan is for antibiotics with IV cefepime  for 5 days.  Assessment and Plan:  Metabolic Encephalopathy: Unclear baseline.   Suspect poor nutrition failure to thrive mild hypotension possible UTI. Continue to Minimize central acting meds. Improving overall with hydration and time and treatment of possible infection. Delirium Precautions PT OT had recommended SNF but patient's daughter wants to take her home with home health whenever she is doing better; still remains fairly confused.    Hypotension, presumed septic shock: With source is possible UTI given recent history of the same as well as her report of dysuria (although hard to cooperate given her AMS). C/w MAP goal greater than 65, weaned off norepinephrine  6/27; C/w Midodrine 10 mg po TID. Continuing IV Abc with Cefepime  and planning on 5 days Tx. UA with leukocytes, pyuria, although there are some squamous cells so may not be accurate   History of dementia: Unclear if encephalopathy is far from baseline, overall seems improved. Continue home Memantine  10 mg po BID   Hypothyroidism: Check TSH in the AM. C/w Levothyroxine  175 mcg po Daily  History of PE: Continue home Apixaban  5 mg po BID  Hx of CVA with L Sided Deficits: Resume ASA 81 mg po Daily and Atorvastatin  80 mg po at bedtime. PT/OT to evaluate and Treat and recommending SNF with patient's daughter wants to take her home with home health  HLD: C/w Atorvastatin  80 mg po Daily and Ezetimibe  10 mg po at bedtime  Diabetes Mellitus Type 2: Her Home Novology and Humalog  Mix currently being held. Will place on Very Sensitive Novolog  SSI AC.  CBGs ranged from 103-175  Depression and Anxiety: C/w Bupropion  150 mg  po BID and will resume Duloxetine  60 mg po Daily  ESRD on HD: BUN/Cr Trend: Recent Labs  Lab 12/01/23 0731 12/04/23 1633 12/07/23 1810 12/08/23 0405 12/09/23 0251 12/10/23 0835 12/11/23 0544  BUN 46* 58* 29* 34* 44* 25* 45*  CREATININE 5.89* 7.20* 4.72* 5.83* 6.95* 4.73* 6.65*  -C/w Sevelamer  Carbonate 0.8 grams TIDwm -Avoid Nephrotoxic Medications, Contrast Dyes, Hypotension and Dehydration to  Ensure Adequate Renal Perfusion and will need to Renally Adjust Meds. CTM and Trend Renal Function carefully and repeat CMP in the AM  - Nephrology consulted and plans for dialysis today  Normocytic Anemia/Anemia of Chronic Kidney Disease: Hgb/Hct Trend:  Recent Labs  Lab 12/01/23 0731 12/04/23 1633 12/07/23 1616 12/07/23 1700 12/08/23 0405 12/10/23 1545 12/11/23 0544  HGB 8.2* 8.2* 8.2* 8.3* 7.9* 8.5* 7.8*  HCT 27.0* 26.3* 24.0* 26.9* 25.1* 27.6* 25.0*  MCV 99.3 99.2  --  100.0 98.0 99.6 100.0  -Will resume her home Ferrous Sulfate  325 mg  po Daily -Check Anemia Panel in the AM. CTM for S/Sx of Bleeding; No overt bleeding noted. Repeat CBC in the AM  Hypoalbuminemia: Patient's Albumin  Trend: Recent Labs  Lab 11/29/23 0548 11/30/23 0608 12/01/23 0731 12/04/23 1633 12/07/23 1810 12/10/23 1545 12/11/23 0544  ALBUMIN  2.1* 2.3* 2.3* 2.4* 2.0* 2.7* 2.6*  -CTM and Trend and repeat CMP in the AM  Class I Obesity: Complicates overall prognosis and care. Estimated body mass index is 32.42 kg/m as calculated from the following:   Height as of this encounter: 5' 7 (1.702 m).   Weight as of this encounter: 93.9 kg. Weight Loss and Dietary Counseling given   DVT prophylaxis: SCDs Start: 12/08/23 0034 apixaban  (ELIQUIS ) tablet 5 mg    Code Status: Full Code Family Communication: Discussed with husband at bedside  Disposition Plan:  Level of care: Telemetry Medical Status is: Inpatient Remains inpatient appropriate because: His further clinical improvement and clearance by nephrology as she is getting dialyzed   Consultants:  PCCM transfer Nephrology  Procedures:  As delineated as above  Antimicrobials:  Anti-infectives (From admission, onward)    Start     Dose/Rate Route Frequency Ordered Stop   12/08/23 1000  ceFEPIme  (MAXIPIME ) 1 g in sodium chloride  0.9 % 100 mL IVPB        1 g 200 mL/hr over 30 Minutes Intravenous Daily 12/08/23 0156 12/12/23 1759   12/07/23 1645   ceFEPIme  (MAXIPIME ) 1 g in sodium chloride  0.9 % 100 mL IVPB        1 g 200 mL/hr over 30 Minutes Intravenous  Once 12/07/23 1642 12/07/23 1810   12/07/23 1645  vancomycin  (VANCOREADY) IVPB 2000 mg/400 mL        2,000 mg 200 mL/hr over 120 Minutes Intravenous  Once 12/07/23 1642 12/07/23 2103       Subjective: Seen and examined at bedside and was eating and very confused still.  Thinks she had a baby last night.  Knows that she is in the hospital though.  No other concerns or complaints this time and go for dialysis later today.  Objective: Vitals:   12/11/23 1633 12/11/23 1636 12/11/23 1815 12/11/23 2045  BP: (!) 139/59 (!) 142/55 (!) 134/43 (!) 96/34  Pulse: 87 82 86 88  Resp: 19 16 18 17   Temp:  98 F (36.7 C) 98 F (36.7 C) (!) 97.1 F (36.2 C)  TempSrc:      SpO2:   100% 95%  Weight:  93.9 kg    Height:  Intake/Output Summary (Last 24 hours) at 12/11/2023 2054 Last data filed at 12/11/2023 1636 Gross per 24 hour  Intake --  Output 1000 ml  Net -1000 ml   Filed Weights   12/11/23 0349 12/11/23 1257 12/11/23 1636  Weight: 94.9 kg 94.9 kg 93.9 kg   Examination: Physical Exam:  Constitutional: WN/WD obese Caucasian chronically ill-appearing female Respiratory: Diminished to auscultation bilaterally with some coarse breath sounds and some slight crackles and mild rhonchi but no appreciable wheezing or rales. Normal respiratory effort and patient is not tachypenic. No accessory muscle use.  Unlabored breathing Cardiovascular: RRR, no murmurs / rubs / gallops. S1 and S2 auscultated.  There is 1+ edema Abdomen: Soft, non-tender, distended secondary body habitus. Bowel sounds positive.  GU: Deferred. Musculoskeletal: No clubbing / cyanosis of digits/nails. No joint deformity upper and lower extremities.  Has a right arm AV fistula Skin: No rashes, lesions, ulcers on limited skin evaluation. No induration; Warm and dry.  Neurologic: CN 2-12 grossly intact with no focal  deficits. Romberg sign cerebellar reflexes not assessed.  Psychiatric: Impaired judgment insight and she is awake and alert and oriented x 2.  Still confused  Data Reviewed: I have personally reviewed following labs and imaging studies  CBC: Recent Labs  Lab 12/07/23 1616 12/07/23 1700 12/08/23 0405 12/10/23 1545 12/11/23 0544  WBC  --  7.0 15.4* 7.3 7.4  NEUTROABS  --  5.5  --  5.2 4.5  HGB 8.2* 8.3* 7.9* 8.5* 7.8*  HCT 24.0* 26.9* 25.1* 27.6* 25.0*  MCV  --  100.0 98.0 99.6 100.0  PLT  --  358 352 396 381   Basic Metabolic Panel: Recent Labs  Lab 12/07/23 1810 12/08/23 0405 12/09/23 0251 12/10/23 0835 12/10/23 1545 12/11/23 0544  NA 139 138 135 136  --  135  K 3.4* 3.7 3.9 3.8  --  4.6  CL 110 97* 97* 96*  --  98  CO2 25 23 27 26   --  26  GLUCOSE 102* 145* 80 92  --  126*  BUN 29* 34* 44* 25*  --  45*  CREATININE 4.72* 5.83* 6.95* 4.73*  --  6.65*  CALCIUM  7.0* 8.7* 8.3* 8.3*  --  8.5*  MG  --  1.8  --   --  2.0 2.1  PHOS  --   --   --  4.2  --  5.3*   GFR: Estimated Creatinine Clearance: 9.5 mL/min (A) (by C-G formula based on SCr of 6.65 mg/dL (H)). Liver Function Tests: Recent Labs  Lab 12/07/23 1810 12/10/23 1545 12/11/23 0544  AST 17 14* 11*  ALT 15 12 10   ALKPHOS 41 48 41  BILITOT 0.8 0.5 0.6  PROT 4.6* 5.7* 5.4*  ALBUMIN  2.0* 2.7* 2.6*   No results for input(s): LIPASE, AMYLASE in the last 168 hours. No results for input(s): AMMONIA in the last 168 hours. Coagulation Profile: Recent Labs  Lab 12/07/23 1806  INR 1.8*   Cardiac Enzymes: No results for input(s): CKTOTAL, CKMB, CKMBINDEX, TROPONINI in the last 168 hours. BNP (last 3 results) No results for input(s): PROBNP in the last 8760 hours. HbA1C: No results for input(s): HGBA1C in the last 72 hours. CBG: Recent Labs  Lab 12/10/23 1746 12/10/23 2227 12/11/23 0742 12/11/23 1130 12/11/23 1802  GLUCAP 135* 175* 132* 135* 103*   Lipid Profile: No results for  input(s): CHOL, HDL, LDLCALC, TRIG, CHOLHDL, LDLDIRECT in the last 72 hours. Thyroid  Function Tests: No results for input(s): TSH, T4TOTAL,  FREET4, T3FREE, THYROIDAB in the last 72 hours. Anemia Panel: No results for input(s): VITAMINB12, FOLATE, FERRITIN, TIBC, IRON, RETICCTPCT in the last 72 hours. Sepsis Labs: Recent Labs  Lab 12/07/23 1714  LATICACIDVEN 0.7   Recent Results (from the past 240 hours)  Blood Culture (routine x 2)     Status: None (Preliminary result)   Collection Time: 12/07/23  5:00 PM   Specimen: BLOOD LEFT HAND  Result Value Ref Range Status   Specimen Description BLOOD LEFT HAND  Final   Special Requests   Final    BOTTLES DRAWN AEROBIC AND ANAEROBIC Blood Culture results may not be optimal due to an inadequate volume of blood received in culture bottles   Culture   Final    NO GROWTH 4 DAYS Performed at [facility], [address] [place], [place] [address]    Report Status PENDING  Incomplete  Blood Culture (routine x 2)     Status: None (Preliminary result)   Collection Time: 12/07/23  5:19 PM   Specimen: BLOOD LEFT WRIST  Result Value Ref Range Status   Specimen Description BLOOD LEFT WRIST  Final   Special Requests   Final    BOTTLES DRAWN AEROBIC ONLY Blood Culture results may not be optimal due to an inadequate volume of blood received in culture bottles   Culture   Final    NO GROWTH 4 DAYS Performed at [facility], [address], [place], [place] [address]    Report Status PENDING  Incomplete  MRSA Next Gen by PCR, Nasal     Status: None   Collection Time: 12/08/23 12:34 AM   Specimen: Nasal Mucosa; Nasal Swab  Result Value Ref Range Status   MRSA by PCR Next Gen NOT DETECTED NOT DETECTED Final    Comment: (NOTE) The GeneXpert MRSA Assay (FDA approved for NASAL specimens only), is one component of a comprehensive MRSA colonization surveillance program. It is not intended to diagnose MRSA  infection nor to guide or monitor treatment for MRSA infections. Test performance is not FDA approved in patients less than 33 years old. Performed at [facility], [address] [place], [place] [address]     Radiology Studies: No results found.  Scheduled Meds:  (feeding supplement) PROSource Plus  30 mL Oral BID BM   apixaban   5 mg Oral BID   aspirin  EC  81 mg Oral Daily   atorvastatin   80 mg Oral QPM   buPROPion   150 mg Oral BID WC   Chlorhexidine  Gluconate Cloth  6 each Topical Daily   Chlorhexidine  Gluconate Cloth  6 each Topical Q0600   Chlorhexidine  Gluconate Cloth  6 each Topical Q0600   DULoxetine   60 mg Oral Daily   ezetimibe   10 mg Oral QPM   insulin  aspart  0-5 Units Subcutaneous QHS   insulin  aspart  0-6 Units Subcutaneous TID WC   levothyroxine   175 mcg Oral Q0600   memantine   10 mg Oral BID   midodrine  10 mg Oral TID WC   sevelamer  carbonate  0.8 g Oral TID WC   Continuous Infusions:  ceFEPime  (MAXIPIME ) IV 1 g (12/11/23 1831)    LOS: 3 days   Alejandro Marker, DO [facility] Available via Epic secure chat 7am-7pm After these hours, please refer to coverage provider listed on [email or web address] 12/11/2023, 8:54 PM

## 2023-12-11 NOTE — Procedures (Signed)
 Received patient in bed to unit.  Alert and oriented.  Informed consent signed and in chart.   TX duration: 3 hours  Patient tolerated well.  Transported back to the room  Alert, without acute distress.  Hand-off given to patient's nurse.   Access used: right fistula Access issues: none  Total UF removed: 1 liter   Greig Silvan, RN [facility]

## 2023-12-11 NOTE — Progress Notes (Signed)
 Speech Language Pathology Treatment: Dysphagia  Patient Details Name: Jennifer [name] MRN: [id number] DOB: 1956-05-13 Today's Date: 12/11/2023 Time: 9171-9159 SLP Time Calculation (min) (ACUTE ONLY): 12 min  Assessment / Plan / Recommendation Clinical Impression  Pt with breakfast tray, partially reclined and had not initiated eating. SLP raised head of bed and pt able to feed herself but needs assist to continue feeding due to intermittently fleeing attention. She states she wears dentures when eating however not present. Her mastication was mildly prolonged but able to clear oral cavity. She needed cues to clear mouth prior to taking next bite. There was no s/s aspiration during session. Given lack of dentition, she will need to remain on Dys 3 throughout hospital stay, continue thin liquids, pills with thin as pt is able. Pt needs continued supervision during meals to initiate and attend to her meals. No further ST needed at this time.    HPI HPI: Pt is a 68 yo female recently discharged from [facility] 6/9-6/12 after acute cva and subsequently represented with similar symptoms as this presentation (hypothermia/hypoglycemia/hypoxia and worsening mental status d/c'd 6/23). She was noted to have pneumonia (LLL) and acute pe started on eliquis . She was placed back in rehab. 6/27 she was reportedly in therapy and became unresponsive.  Per daughter pt has been participating in her usual dialysis but most recently had only partial session as pt has become more confused with memory loss and delirium that she pulled out her needles. Question possible UTI.  She has not been eating or drinking well per the family. CXR and CT head (12/07/23) both negative for acute changes. BSE (11/21/23) with oral phase dysphagia and suspected pharyngeal phase dysphagia. No overt s/sx of apsiration present. Dys 1/thin recommended. MBS (05/22/23) also revealed more oral dysphagia, no aspiration and dys 1/thin recommended. PMH: ESRD MWF, H/o  cva with L sided deficits, H/o pe on chronic a/c, T2dm, Hypothyroidism, Anxiety, Depression, dementia.      SLP Plan  All goals met;Discharge SLP treatment due to (comment)          Recommendations  Diet recommendations: Dysphagia 3 (mechanical soft);Thin liquid Liquids provided via: Cup;Straw Medication Administration: Whole meds with liquid Supervision: Patient able to self feed;Full supervision/cueing for compensatory strategies (need full supervision) Compensations: Slow rate;Small sips/bites;Lingual sweep for clearance of pocketing Postural Changes and/or Swallow Maneuvers: Seated upright 90 degrees                  Oral care BID   Frequent or constant Supervision/Assistance Dysphagia, unspecified (R13.10)     All goals met;Discharge SLP treatment due to (comment)     Dustin Olam Bull  12/11/2023, 9:25 AM

## 2023-12-11 NOTE — Progress Notes (Signed)
    Durable Medical Equipment  (From admission, onward)           Start     Ordered   12/11/23 1547  For home use only DME Other see comment  Once       Comments: [facility] [employer]ift  Question:  Length of Need  Answer:  12 Months   12/11/23 1546

## 2023-12-11 NOTE — Progress Notes (Signed)
 Contacted by CSW regarding pt returning home at d/c and needing to resume home HD. Renal PA agreeable to this plan. Contacted [facility] home therapy RN who states pt can resume home HD at d/c with no issues. Home therapy RN will need to know pt's d/c date and when next treatment is due. This info provided to [facility] staff and renal PA. Will assist as needed.   Randine Mungo Renal Navigator [phone]

## 2023-12-11 NOTE — Plan of Care (Signed)
  Problem: Education: Goal: Ability to describe self-care measures that may prevent or decrease complications (Diabetes Survival Skills Education) will improve Outcome: Progressing   Problem: Nutritional: Goal: Maintenance of adequate nutrition will improve Outcome: Progressing Goal: Progress toward achieving an optimal weight will improve Outcome: Progressing   Problem: Skin Integrity: Goal: Risk for impaired skin integrity will decrease Outcome: Progressing   Problem: Safety: Goal: Ability to remain free from injury will improve Outcome: Progressing

## 2023-12-11 NOTE — Progress Notes (Signed)
 [facility] Progress Note   Subjective:   Seen in room. Answers questions slow, but accurate. No CP/dyspnea. For HD later today.  Objective Vitals:   12/10/23 2019 12/11/23 0349 12/11/23 0547 12/11/23 0855  BP: (!) 133/45  122/63 (!) 130/46  Pulse: 77  67 83  Resp: 17   18  Temp: 97.9 F (36.6 C)  98.7 F (37.1 C) 98.6 F (37 C)  TempSrc: Oral  Oral   SpO2: 96%  98% 100%  Weight:  94.9 kg    Height:       Physical Exam General: Well appearing, NAD, room air Heart: RRR; no murmur Lungs: CTA anteriorly Abdomen: soft Extremities: 1+ hip edema, none in ankles Dialysis Access: RUE AVF +t/b  Additional Objective Labs: Basic Metabolic Panel: Recent Labs  Lab 12/04/23 1633 12/07/23 1616 12/09/23 0251 12/10/23 0835 12/11/23 0544  NA 137   < > 135 136 135  K 4.5   < > 3.9 3.8 4.6  CL 97*   < > 97* 96* 98  CO2 24   < > 27 26 26   GLUCOSE 130*   < > 80 92 126*  BUN 58*   < > 44* 25* 45*  CREATININE 7.20*   < > 6.95* 4.73* 6.65*  CALCIUM  8.8*   < > 8.3* 8.3* 8.5*  PHOS 7.6*  --   --  4.2 5.3*   < > = values in this interval not displayed.   Liver Function Tests: Recent Labs  Lab 12/07/23 1810 12/10/23 1545 12/11/23 0544  AST 17 14* 11*  ALT 15 12 10   ALKPHOS 41 48 41  BILITOT 0.8 0.5 0.6  PROT 4.6* 5.7* 5.4*  ALBUMIN  2.0* 2.7* 2.6*   CBC: Recent Labs  Lab 12/04/23 1633 12/07/23 1616 12/07/23 1700 12/08/23 0405 12/10/23 1545 12/11/23 0544  WBC 8.2  --  7.0 15.4* 7.3 7.4  NEUTROABS  --   --  5.5  --  5.2 4.5  HGB 8.2*   < > 8.3* 7.9* 8.5* 7.8*  HCT 26.3*   < > 26.9* 25.1* 27.6* 25.0*  MCV 99.2  --  100.0 98.0 99.6 100.0  PLT 325  --  358 352 396 381   < > = values in this interval not displayed.   Medications:  ceFEPime  (MAXIPIME ) IV Stopped (12/10/23 1803)    apixaban   5 mg Oral BID   aspirin  EC  81 mg Oral Daily   atorvastatin   80 mg Oral QPM   buPROPion   150 mg Oral BID WC   Chlorhexidine  Gluconate Cloth  6 each Topical Daily    Chlorhexidine  Gluconate Cloth  6 each Topical Q0600   Chlorhexidine  Gluconate Cloth  6 each Topical Q0600   DULoxetine   60 mg Oral Daily   ezetimibe   10 mg Oral QPM   insulin  aspart  0-5 Units Subcutaneous QHS   insulin  aspart  0-6 Units Subcutaneous TID WC   levothyroxine   175 mcg Oral Q0600   memantine   10 mg Oral BID   midodrine  10 mg Oral TID WC   sevelamer  carbonate  0.8 g Oral TID WC   Dialysis Orders MWF - [place] 4hr, 400/A1.5, EDW 90kg, 2K/2Ca, AVF, no heparin  - Last given Mircera 100mcg on 6/19.  - Recently changed from HHD to iHD after recent CVA  Assessment/Plan: AMS: Unresponsive on admit - hypotensive, hypothermic - unclear etiology. Blood Cx negative. Required pressors for hypotension - improving now, out of ICU, remains on Cefepime . ESRD:  For HD today, following MWF schedule. HypoTN/volume: BP stable, on mido TID. Min LE edema. Anemia of ESRD: Hgb 7.8 - resume ESA as outpatient. Secondary HPTH: CorrCa/Phos ok - continue home binder (sevelamer ) Nutrition: Alb low, adding PO supplements. Hx recent PE + CVA (6/9): On Eliquis    Izetta Boehringer, PA-C 12/11/2023, 10:40 AM  [employer]

## 2023-12-11 NOTE — Care Management Important Message (Signed)
 Important Message  Patient Details  Name: Jennifer [name] MRN: [id number] Date of Birth: 11-23-1955   Important Message Given:  Yes - Medicare IM     Claretta Deed 12/11/2023, 3:19 PM

## 2023-12-11 NOTE — [place] FL2 (Signed)
 [place]  MEDICAID FL2 LEVEL OF CARE FORM     IDENTIFICATION  Patient Name: Jennifer [name] Birthdate: 21-Sep-1955 Sex: female Admission Date (Current Location): 12/07/2023  [facility] and [place] Number:  [employer] and Address:  [facility], [address] [place], [place] [address]      Provider Number: [id number]  Attending Physician Name and Address:  Sherrill Alejandro Donovan, DO  Relative Name and Phone Number:       Current Level of Care: Hospital Recommended Level of Care: Skilled Nursing Facility Prior Approval Number:    Date Approved/Denied:   PASRR Number: [id number] E expires 12/22/23  Discharge Plan: SNF    Current Diagnoses: Patient Active Problem List   Diagnosis Date Noted   Shock (HCC) 12/08/2023   Pulmonary embolism (HCC) 11/28/2023   Acute respiratory failure with hypoxia (HCC) 11/27/2023   Acute metabolic encephalopathy 11/27/2023   Controlled type 2 diabetes mellitus with hypoglycemia (HCC) 11/27/2023   SIRS (systemic inflammatory response syndrome) (HCC) 11/27/2023   Acute left-sided weakness 11/20/2023   Abnormal urine odor 05/30/2023   Recurrent strokes (HCC) 05/20/2023   Anemia of chronic renal failure 12/19/2022   (HFpEF) heart failure with preserved ejection fraction (HCC) 11/25/2022   Auditory hallucination 07/26/2022   Carotid artery stenosis, asymptomatic, left 05/04/2022   Anemia of chronic disease 03/23/2022   Hyponatremia 03/23/2022   Hyperkalemia 03/23/2022   Dehydration 03/23/2022   Chronic diastolic CHF (congestive heart failure) (HCC) 03/23/2022   Closed left ankle fracture 01/26/2022   Type 2 diabetes mellitus with hyperglycemia (HCC) 01/07/2022   Acute kidney injury superimposed on chronic kidney disease (HCC) 11/19/2021   Frequent urination 07/13/2021   Change in mental status 07/13/2021   Gait abnormality 07/13/2021   Memory loss 07/13/2021   Abnormal urinalysis 07/13/2021   ESRD on  hemodialysis (HCC) 07/13/2021   Normocytic anemia 07/13/2021   Diabetic nephropathy (HCC) 05/28/2020   Diabetic retinopathy (HCC) 05/28/2020   Presence of insulin  pump (external) (internal) 05/28/2020   Diabetic macular edema of right eye with proliferative retinopathy associated with type 2 diabetes mellitus (HCC) 04/16/2020   Macular pucker, right eye 04/16/2020   Vitreous hemorrhage of left eye (HCC) 04/16/2020   Pre-op examination 03/13/2020   Chronic venous insufficiency 02/06/2020   Constipation 05/28/2019   S/P TKR (total knee replacement), right 03/23/2019 05/07/2019   Primary osteoarthritis of right knee 04/02/2019   Unilateral primary osteoarthritis, right knee    Pedal edema 02/25/2019   Acute vertigo with vomiting and inability to stand 10/08/2018   Uncontrolled type 2 diabetes mellitus with hyperglycemia, with long-term current use of insulin  (HCC) 10/08/2018   Former smoker 10/08/2018   Carotid artery disease (HCC) 03/15/2018   History of CVA (cerebrovascular accident) 11/16/2017   Low back pain 10/26/2017   Screening mammogram, encounter for 09/18/2017   Coronary artery disease involving native coronary artery of native heart without angina pectoris 11/04/2015   NSTEMI (non-ST elevated myocardial infarction) (HCC) 11/03/2015   Leukocytosis 08/16/2010   Acquired hypothyroidism 07/06/2010   HLD (hyperlipidemia) 08/28/2008   Essential hypertension 11/13/2007   Proliferative diabetic retinopathy of left eye with macular edema associated with type 2 diabetes mellitus (HCC) 09/21/2006   Diabetic peripheral neuropathy (HCC) 09/21/2006   Obesity (BMI 30-39.9) 09/21/2006   Anxiety 09/21/2006   Adjustment disorder with mixed anxiety and depressed mood 09/21/2006   CARPAL TUNNEL SYNDROME 09/21/2006   COPD (chronic obstructive pulmonary disease) (HCC) 09/21/2006   EDEMA 09/21/2006   MIGRAINES,  HX OF 09/21/2006    Orientation RESPIRATION BLADDER Height & Weight     Self   Normal Incontinent, External catheter Weight: 209 lb 3.5 oz (94.9 kg) Height:  5' 7 (170.2 cm)  BEHAVIORAL SYMPTOMS/MOOD NEUROLOGICAL BOWEL NUTRITION STATUS      Incontinent Diet (See dc summary)  AMBULATORY STATUS COMMUNICATION OF NEEDS Skin   Extensive Assist Verbally Other (Comment) (Non pressure wound on left toe; pretibial right hematoma; deep tissue injury on perineum)                       Personal Care Assistance Level of Assistance  Bathing, Feeding, Dressing Bathing Assistance: Maximum assistance Feeding assistance: Limited assistance Dressing Assistance: Maximum assistance     Functional Limitations Info  Sight Sight Info: Impaired        SPECIAL CARE FACTORS FREQUENCY  PT (By licensed PT), OT (By licensed OT)     PT Frequency: 5x/week OT Frequency: 5x/week            Contractures Contractures Info: Not present    Additional Factors Info  Code Status, Allergies Code Status Info: Full Allergies Info: Glucophage; Tnkase            Current Medications (12/11/2023):  This is the current hospital active medication list Current Facility-Administered Medications  Medication Dose Route Frequency Provider Last Rate Last Admin   (feeding supplement) PROSource Plus liquid 30 mL  30 mL Oral BID BM Stovall, Kathryn R, PA-C       apixaban  (ELIQUIS ) tablet 5 mg  5 mg Oral BID Hunsucker, Donnice SAUNDERS, MD   5 mg at 12/11/23 1020   aspirin  EC tablet 81 mg  81 mg Oral Daily Sheikh, Omair Latif, DO   81 mg at 12/11/23 1020   atorvastatin  (LIPITOR ) tablet 80 mg  80 mg Oral QPM Sheikh, Omair Latif, DO   80 mg at 12/10/23 1734   buPROPion  (WELLBUTRIN  SR) 12 hr tablet 150 mg  150 mg Oral BID WC Hunsucker, Donnice SAUNDERS, MD   150 mg at 12/11/23 1020   ceFEPIme  (MAXIPIME ) 1 g in sodium chloride  0.9 % 100 mL IVPB  1 g Intravenous Daily Hunsucker, Donnice SAUNDERS, MD   Stopping previously hung infusion at 12/10/23 1803   Chlorhexidine  Gluconate Cloth 2 % PADS 6 each  6 each Topical Daily  Hunsucker, Donnice SAUNDERS, MD   6 each at 12/11/23 1020   Chlorhexidine  Gluconate Cloth 2 % PADS 6 each  6 each Topical Q0600 Hunsucker, Donnice SAUNDERS, MD   6 each at 12/10/23 0600   Chlorhexidine  Gluconate Cloth 2 % PADS 6 each  6 each Topical Q0600 Geralynn Charleston, MD   6 each at 12/11/23 0546   docusate sodium  (COLACE) capsule 100 mg  100 mg Oral BID PRN Hunsucker, Donnice SAUNDERS, MD       DULoxetine  (CYMBALTA ) DR capsule 60 mg  60 mg Oral Daily Sheikh, Omair Latif, DO   60 mg at 12/11/23 1020   ezetimibe  (ZETIA ) tablet 10 mg  10 mg Oral QPM Sheikh, Omair Latif, DO   10 mg at 12/10/23 1734   ferrous sulfate  tablet 325 mg  325 mg Oral Daily PRN Sheikh, Omair Latif, DO       insulin  aspart (novoLOG ) injection 0-5 Units  0-5 Units Subcutaneous QHS Sheikh, Omair Latif, DO       insulin  aspart (novoLOG ) injection 0-6 Units  0-6 Units Subcutaneous TID WC Sheikh, Omair [place], DO  levothyroxine  (SYNTHROID ) tablet 175 mcg  175 mcg Oral Q0600 Annella Donnice SAUNDERS, MD   175 mcg at 12/11/23 9453   memantine  (NAMENDA ) tablet 10 mg  10 mg Oral BID Hunsucker, Donnice SAUNDERS, MD   10 mg at 12/11/23 1020   midodrine (PROAMATINE) tablet 10 mg  10 mg Oral TID WC Hunsucker, Donnice SAUNDERS, MD   10 mg at 12/11/23 1020   Oral care mouth rinse  15 mL Mouth Rinse PRN Hunsucker, Donnice SAUNDERS, MD       polyethylene glycol (MIRALAX  / GLYCOLAX ) packet 17 g  17 g Oral Daily PRN Hunsucker, Donnice SAUNDERS, MD       sevelamer  carbonate (RENVELA ) packet 0.8 g  0.8 g Oral TID WC Hunsucker, Donnice SAUNDERS, MD   0.8 g at 12/11/23 1020     Discharge Medications: Please see discharge summary for a list of discharge medications.  Relevant Imaging Results:  Relevant Lab Results:   Additional Information SSN: [id number]. Outpatient Dialysis [facility] on MWF 12:00 pm chair time  Panda Crossin S Lenardo Westwood, LCSW

## 2023-12-12 ENCOUNTER — Inpatient Hospital Stay (HOSPITAL_COMMUNITY)

## 2023-12-12 ENCOUNTER — Other Ambulatory Visit (HOSPITAL_COMMUNITY): Payer: Self-pay

## 2023-12-12 LAB — COMPREHENSIVE METABOLIC PANEL WITH GFR
ALT: 9 U/L (ref 0–44)
AST: 14 U/L — ABNORMAL LOW (ref 15–41)
Albumin: 2.5 g/dL — ABNORMAL LOW (ref 3.5–5.0)
Alkaline Phosphatase: 37 U/L — ABNORMAL LOW (ref 38–126)
Anion gap: 12 (ref 5–15)
BUN: 23 mg/dL (ref 8–23)
CO2: 29 mmol/L (ref 22–32)
Calcium: 8.4 mg/dL — ABNORMAL LOW (ref 8.9–10.3)
Chloride: 95 mmol/L — ABNORMAL LOW (ref 98–111)
Creatinine, Ser: 4.16 mg/dL — ABNORMAL HIGH (ref 0.44–1.00)
GFR, Estimated: 11 mL/min — ABNORMAL LOW (ref >60)
Glucose, Bld: 90 mg/dL (ref 70–99)
Potassium: 3.7 mmol/L (ref 3.5–5.1)
Sodium: 136 mmol/L (ref 135–145)
Total Bilirubin: 0.4 mg/dL (ref 0.0–1.2)
Total Protein: 5.2 g/dL — ABNORMAL LOW (ref 6.5–8.1)

## 2023-12-12 LAB — RETICULOCYTES
Immature Retic Fract: 15 % (ref 2.3–15.9)
RBC.: 2.45 MIL/uL — ABNORMAL LOW (ref 3.87–5.11)
Retic Count, Absolute: 76.9 10*3/uL (ref 19.0–186.0)
Retic Ct Pct: 3.1 % (ref 0.4–3.1)

## 2023-12-12 LAB — CULTURE, BLOOD (ROUTINE X 2)
Culture: NO GROWTH
Culture: NO GROWTH

## 2023-12-12 LAB — CBC WITH DIFFERENTIAL/PLATELET
Abs Immature Granulocytes: 0.1 10*3/uL — ABNORMAL HIGH (ref 0.00–0.07)
Basophils Absolute: 0.1 10*3/uL (ref 0.0–0.1)
Basophils Relative: 1 %
Eosinophils Absolute: 0.2 10*3/uL (ref 0.0–0.5)
Eosinophils Relative: 2 %
HCT: 23.7 % — ABNORMAL LOW (ref 36.0–46.0)
Hemoglobin: 7.5 g/dL — ABNORMAL LOW (ref 12.0–15.0)
Immature Granulocytes: 1 %
Lymphocytes Relative: 30 %
Lymphs Abs: 2.1 10*3/uL (ref 0.7–4.0)
MCH: 30.9 pg (ref 26.0–34.0)
MCHC: 31.6 g/dL (ref 30.0–36.0)
MCV: 97.5 fL (ref 80.0–100.0)
Monocytes Absolute: 0.6 10*3/uL (ref 0.1–1.0)
Monocytes Relative: 8 %
Neutro Abs: 3.9 10*3/uL (ref 1.7–7.7)
Neutrophils Relative %: 58 %
Platelets: 341 10*3/uL (ref 150–400)
RBC: 2.43 MIL/uL — ABNORMAL LOW (ref 3.87–5.11)
RDW: 15.6 % — ABNORMAL HIGH (ref 11.5–15.5)
WBC: 6.9 10*3/uL (ref 4.0–10.5)
nRBC: 0 % (ref 0.0–0.2)

## 2023-12-12 LAB — GLUCOSE, CAPILLARY
Glucose-Capillary: 88 mg/dL (ref 70–99)
Glucose-Capillary: 120 mg/dL — ABNORMAL HIGH (ref 70–99)
Glucose-Capillary: 120 mg/dL — ABNORMAL HIGH (ref 70–99)

## 2023-12-12 LAB — VITAMIN B12: Vitamin B-12: 508 pg/mL (ref 180–914)

## 2023-12-12 LAB — IRON AND TIBC
Iron: 79 ug/dL (ref 28–170)
Saturation Ratios: 32 % — ABNORMAL HIGH (ref 10.4–31.8)
TIBC: 251 ug/dL (ref 250–450)
UIBC: 172 ug/dL

## 2023-12-12 LAB — HEPATITIS B SURFACE ANTIGEN: Hepatitis B Surface Ag: NONREACTIVE

## 2023-12-12 LAB — FERRITIN: Ferritin: 339 ng/mL — ABNORMAL HIGH (ref 11–307)

## 2023-12-12 LAB — MAGNESIUM: Magnesium: 2 mg/dL (ref 1.7–2.4)

## 2023-12-12 LAB — TSH: TSH: 3.318 u[IU]/mL (ref 0.350–4.500)

## 2023-12-12 LAB — PHOSPHORUS: Phosphorus: 3.4 mg/dL (ref 2.5–4.6)

## 2023-12-12 LAB — FOLATE: Folate: 12.1 ng/mL (ref >5.9)

## 2023-12-12 MED ORDER — POLYETHYLENE GLYCOL 3350 17 GM/SCOOP PO POWD
17.0000 g | Freq: Every day | ORAL | 0 refills | Status: DC | PRN
  Filled 2023-12-12: qty 238, 14d supply, fill #0

## 2023-12-12 MED ORDER — POLYETHYLENE GLYCOL 3350 17 GM/SCOOP PO POWD: 17.0000 g | Freq: Every day | ORAL | 0 refills | Status: DC | PRN

## 2023-12-12 MED ORDER — CARVEDILOL 3.125 MG PO TABS: 12.5000 mg | ORAL_TABLET | Freq: Every day | ORAL | 0 refills | Status: DC

## 2023-12-12 MED ORDER — APIXABAN 5 MG PO TABS: ORAL_TABLET | ORAL | Status: DC

## 2023-12-12 MED ORDER — MIDODRINE HCL 10 MG PO TABS
10.0000 mg | ORAL_TABLET | Freq: Three times a day (TID) | ORAL | 0 refills | Status: DC
Start: 2023-12-12 — End: 2023-12-12
  Filled 2023-12-12: qty 80, 27d supply, fill #0

## 2023-12-12 MED ORDER — CARVEDILOL 3.125 MG PO TABS
12.5000 mg | ORAL_TABLET | Freq: Every day | ORAL | 0 refills | Status: DC
  Filled 2023-12-12: qty 60, 15d supply, fill #0

## 2023-12-12 MED ORDER — MIDODRINE HCL 10 MG PO TABS
10.0000 mg | ORAL_TABLET | Freq: Three times a day (TID) | ORAL | 0 refills | Status: DC
Start: 2023-12-12 — End: 2024-01-03

## 2023-12-12 MED ORDER — APIXABAN 5 MG PO TABS: 5.0000 mg | ORAL_TABLET | Freq: Two times a day (BID) | ORAL | 0 refills | Status: DC

## 2023-12-12 NOTE — Progress Notes (Signed)
 Family arrived and assisted in getting patient into wheel chair to take out.  AVS provided to family Floor RN assisted family and escorted patient to car for discharge.

## 2023-12-12 NOTE — Progress Notes (Signed)
 [facility] Progress Note   Subjective:  No overnight events, feeling ok today. Family wants to take her home and resume home HD, as oppposed to going back to SNF and in-center HD. Fine with our team as long as family has the resources to care for her.  Objective Vitals:   12/11/23 1815 12/11/23 2045 12/12/23 0510 12/12/23 0900  BP: (!) 134/43 (!) 96/34 (!) 120/45 (!) 123/56  Pulse: 86 88 86 88  Resp: 18 17 17    Temp: 98 F (36.7 C) (!) 97.1 F (36.2 C) (!) 97.1 F (36.2 C) 98 F (36.7 C)  TempSrc:   Oral Oral  SpO2: 100% 95% 99% 95%  Weight:      Height:       Physical Exam General: Well appearing, NAD, room air Heart: RRR; no murmur Lungs: CTA anteriorly Abdomen: soft Extremities: 1+ hip edema, none in ankles Dialysis Access: RUE AVF +t/b  Additional Objective Labs: Basic Metabolic Panel: Recent Labs  Lab 12/10/23 0835 12/11/23 0544 12/12/23 0521  NA 136 135 136  K 3.8 4.6 3.7  CL 96* 98 95*  CO2 26 26 29   GLUCOSE 92 126* 90  BUN 25* 45* 23  CREATININE 4.73* 6.65* 4.16*  CALCIUM  8.3* 8.5* 8.4*  PHOS 4.2 5.3* 3.4   Liver Function Tests: Recent Labs  Lab 12/10/23 1545 12/11/23 0544 12/12/23 0521  AST 14* 11* 14*  ALT 12 10 9   ALKPHOS 48 41 37*  BILITOT 0.5 0.6 0.4  PROT 5.7* 5.4* 5.2*  ALBUMIN  2.7* 2.6* 2.5*   CBC: Recent Labs  Lab 12/07/23 1700 12/08/23 0405 12/10/23 1545 12/11/23 0544 12/12/23 0521  WBC 7.0 15.4* 7.3 7.4 6.9  NEUTROABS 5.5  --  5.2 4.5 3.9  HGB 8.3* 7.9* 8.5* 7.8* 7.5*  HCT 26.9* 25.1* 27.6* 25.0* 23.7*  MCV 100.0 98.0 99.6 100.0 97.5  PLT 358 352 396 381 341   Blood Culture    Component Value Date/Time   SDES BLOOD LEFT WRIST 12/07/2023 1719   SPECREQUEST  12/07/2023 1719    BOTTLES DRAWN AEROBIC ONLY Blood Culture results may not be optimal due to an inadequate volume of blood received in culture bottles   CULT  12/07/2023 1719    NO GROWTH 5 DAYS Performed at [facility], [address], [place], [place] [address]    REPTSTATUS 12/12/2023 FINAL 12/07/2023 1719   CBG: Recent Labs  Lab 12/11/23 0742 12/11/23 1130 12/11/23 1802 12/11/23 2201 12/12/23 0732  GLUCAP 132* 135* 103* 138* 88   Iron Studies:  Recent Labs    12/12/23 0521  IRON 79  TIBC 251  FERRITIN 339*   Studies/Results: DG CHEST PORT 1 VIEW Result Date: 12/12/2023 CLINICAL DATA:  Shortness of breath. EXAM: PORTABLE CHEST 1 VIEW COMPARISON:  12/07/2023 FINDINGS: Streaky density in the lung bases suggest atelectasis. No pulmonary edema or pleural effusion. Cardiopericardial silhouette is at upper limits of normal for size. No acute bony abnormality. Telemetry leads overlie the chest. IMPRESSION: Streaky density in the lung bases suggest atelectasis. Electronically Signed   By: Camellia Candle M.D.   On: 12/12/2023 08:11   Medications:  ceFEPime  (MAXIPIME ) IV 1 g (12/11/23 1831)    (feeding supplement) PROSource Plus  30 mL Oral BID BM   apixaban   5 mg Oral BID   aspirin  EC  81 mg Oral Daily   atorvastatin   80 mg Oral QPM   buPROPion   150 mg Oral BID WC   Chlorhexidine   Gluconate Cloth  6 each Topical Daily   Chlorhexidine  Gluconate Cloth  6 each Topical Q0600   Chlorhexidine  Gluconate Cloth  6 each Topical Q0600   DULoxetine   60 mg Oral Daily   ezetimibe   10 mg Oral QPM   insulin  aspart  0-5 Units Subcutaneous QHS   insulin  aspart  0-6 Units Subcutaneous TID WC   levothyroxine   175 mcg Oral Q0600   memantine   10 mg Oral BID   midodrine  10 mg Oral TID WC   sevelamer  carbonate  0.8 g Oral TID WC    Dialysis Orders MWF - [facility] 4[facility], 400/A1.5, EDW 90kg, 2K/2Ca, AVF, no heparin  - Last given Mircera 100mcg on 6/19.  - Recently changed from HHD to iHD after recent CVA   Assessment/Plan: AMS: Unresponsive on admit - hypotensive, hypothermic - unclear etiology. Blood Cx negative. Required pressors for hypotension - improving now, out of ICU, remains on Cefepime . ESRD: Following MWF schedule here - next  HD tomorrow if still here. HypoTN/volume: BP stable, on mido TID. Min LE edema. Anemia of ESRD: Hgb 7.5 - resume ESA as outpatient. Secondary HPTH: CorrCa/Phos ok - continue home binder (sevelamer ) Nutrition: Alb low, continue PO supplements. Hx recent PE + CVA (6/9): On Eliquis      Izetta Boehringer, PA-C 12/12/2023, 10:53 AM  [employer]

## 2023-12-12 NOTE — Progress Notes (Signed)
 D/C order noted. Contacted pt's home therapy RN to be advised of pt's d/c today.   Randine Mungo Renal Navigator [phone]  Addendum at 9:42 am on 12/13/23: Contacted [facility] this morning to be advised of pt's late d/c to home yesterday and that pt will resume home HD at d/c. Clinic advised pt will not require in-center HD chair at d/c.

## 2023-12-12 NOTE — Discharge Summary (Signed)
 Physician Discharge Summary   Patient: Jennifer [name] MRN: [id number] DOB: 1955/09/12  Admit date:     12/07/2023  Discharge date: 12/12/23  Discharge Physician: Alejandro Marker, DO   PCP: Randeen Laine LABOR, MD   Recommendations at discharge:   Follow-up with PCP within 1 to 2 weeks repeat CBC, CMP, mag, Phos within 1 week Follow-up with nephrology in outpatient setting and continue maintenance of hemodialysis  Discharge Diagnoses: Principal Problem:   Shock (HCC)  Resolved Problems:   * No resolved hospital problems. [facility] Course: HPI per PCCM Team:  68 yo female recently discharged from [facility] 6/9-6/12 after acute cva and subsequently represented with similar symptoms as this presentation (hypothermia/hypoglycemia/hypoxia and worsening mental status d/c'd 6/23). She was noted to have pneumonia (LLL) and acute pe started on eliquis . She was placed back in rehab. Today she was reportedly in therapy and became unresponsive. She was hypoxic to 77 on RA and hypoglycemic to 40's. Per daughter pt has been participating in her usual dialysis but most recently had only partial session as pt has become more confused with memory loss and delirium that she pulled out her needles.     She has not been eating or drinking well per the family at bedside and does have low albumin  on lab analysis. She was previously on midodrine at baseline but daughter states was taken off as pt was quite responsive to it and became hypertensive with it.   TSH within normal range. Hgb stable. CT chest with small pericardial effusion, no acute pulmonary changes, still with LLL pneumonia. Lactate stable. No elevated wbc but is hypothermic 95.6, despite ~1L of fluid pt remained hypotensive and was started on levo. BS has improved with amp of d50. Pt denies any complaints with exception of being tired.    Ccm was asked to admit for vasopressor titration.    D/w was had with family about cvc placement. At this time we will  defer this as anticipate improvement with resuming midodrine  **Interim History She is been weaned off of pressors and underwent hemodialysis yesterday.  Mentation is better per nursing and will obtain PT OT evaluation which is recommending SNF but patient wants to go home with home health.  Current plan is for antibiotics with IV cefepime  for 5 days and she completed this while hospitalized.  She is improved and medically stable for discharge at this time will need to follow-up with PCP, nephrology in outpatient setting  Assessment and Plan:  Metabolic Encephalopathy, improved: Initially has a unclear baseline but was unresponsive on admission.  Suspect poor nutrition failure to thrive mild hypotension possible UTI. Continue to Minimize central acting meds. Improving overall with hydration and time and treatment of possible infection. Delirium Precautions PT OT had recommended SNF but patient's daughter wants to take her home with home health whenever she is doing better; still remains fairly confused but family reported that she is back to her baseline   Hypotension, presumed septic shock: With source is possible UTI given recent history of the same as well as her report of dysuria (although hard to cooperate given her AMS). C/w MAP goal greater than 65, weaned off norepinephrine  6/27; C/w Midodrine 10 mg po TID. Continuing IV Abc with Cefepime  and planning on 5 days Tx. UA with leukocytes, pyuria, although there are some squamous cells so may not be accurate.  Blood pressures are stable and will continue midodrine at discharge   History of dementia: Initially was unclear if  encephalopathy is far from baseline, overall seems improved. Continue home Memantine  10 mg po BID.  Family reports that she is at her baseline now   Hypothyroidism: Check TSH in the AM. C/w Levothyroxine  175 mcg po Daily  History of PE: Continue home Apixaban  5 mg po BID.  Apparently did not have a prescription for this at  discharge so have written for  Hx of CVA with L Sided Deficits: Resume ASA 81 mg po Daily and Atorvastatin  80 mg po at bedtime. PT/OT to evaluate and Treat and recommending SNF with patient's daughter wants to take her home with home health  HLD: C/w Atorvastatin  80 mg po Daily and Ezetimibe  10 mg po at bedtime  Diabetes Mellitus Type 2: Her Home Novology and Humalog  Mix currently being held. Will place on Very Sensitive Novolog  SSI AC.  CBGs ranged from 103-175  Depression and Anxiety: C/w Bupropion  150 mg po BID and will resume Duloxetine  60 mg po Daily  ESRD on HD: BUN/Cr Trend: Recent Labs  Lab 12/04/23 1633 12/07/23 1810 12/08/23 0405 12/09/23 0251 12/10/23 0835 12/11/23 0544 12/12/23 0521  BUN 58* 29* 34* 44* 25* 45* 23  CREATININE 7.20* 4.72* 5.83* 6.95* 4.73* 6.65* 4.16*  -C/w Sevelamer  Carbonate 0.8 grams TIDwm -Avoid Nephrotoxic Medications, Contrast Dyes, Hypotension and Dehydration to Ensure Adequate Renal Perfusion and will need to Renally Adjust Meds. CTM and Trend Renal Function carefully and repeat CMP in the AM  -Nephrology consulted and dialyzed her yesterday and she will continue maintenance dialysis in outpatient setting  Normocytic Anemia/Anemia of Chronic Kidney Disease: Hgb/Hct Trend:  Recent Labs  Lab 12/04/23 1633 12/07/23 1616 12/07/23 1700 12/08/23 0405 12/10/23 1545 12/11/23 0544 12/12/23 0521  HGB 8.2* 8.2* 8.3* 7.9* 8.5* 7.8* 7.5*  HCT 26.3* 24.0* 26.9* 25.1* 27.6* 25.0* 23.7*  MCV 99.2  --  100.0 98.0 99.6 100.0 97.5  -Will resume her home Ferrous Sulfate  325 mg  po Daily -Checked Anemia Panel and it showed an iron level of 79, UIBC 172, TIBC 251, saturation ratio 32%, ferritin level 339, folate level 12.1 and a vitamin B12 of 508. CTM for S/Sx of Bleeding; No overt bleeding noted. Repeat CBC in the AM  Hypoalbuminemia: Patient's Albumin  Trend: Recent Labs  Lab 11/30/23 0608 12/01/23 0731 12/04/23 1633 12/07/23 1810 12/10/23 1545  12/11/23 0544 12/12/23 0521  ALBUMIN  2.3* 2.3* 2.4* 2.0* 2.7* 2.6* 2.5*  -CTM and Trend and repeat CMP in the AM  Class I Obesity: Complicates overall prognosis and care. Estimated body mass index is 32.42 kg/m as calculated from the following:   Height as of this encounter: 5' 7 (1.702 m).   Weight as of this encounter: 93.9 kg. Weight Loss and Dietary Counseling given  Consultants: PCCM transfer, nephrology Procedures performed: Hemodialysis; as above Disposition: Home health Diet recommendation:  Discharge Diet Orders (From admission, onward)     Start     Ordered   12/12/23 0000  Diet - low sodium heart healthy       Comments: Dysphagia 3 Renal Diet   12/12/23 1648           Renal diet DISCHARGE MEDICATION: Allergies as of 12/12/2023       Reactions   Glucophage [metformin] Diarrhea   Tnkase  [tenecteplase ] Other (See Comments)   Unknown reaction        Medication List     STOP taking these medications    furosemide  80 MG tablet Commonly known as: LASIX    iron sucrose 100 mg in  sodium chloride  0.9 % 100 mL   lidocaine -prilocaine  cream Commonly known as: EMLA        TAKE these medications    apixaban  5 MG Tabs tablet Commonly known as: ELIQUIS  Take 1 tablet (5 mg total) by mouth 2 (two) times daily. Take 5 mg by mouth twice daily What changed: See the new instructions.   aspirin  81 MG chewable tablet Chew 1 tablet (81 mg total) by mouth daily.   atorvastatin  80 MG tablet Commonly known as: LIPITOR  TAKE 1 TABLET BY MOUTH ONCE DAILY AT  6  IN  THE  EVENING What changed:  how much to take how to take this when to take this additional instructions   bisacodyl  10 MG suppository Commonly known as: DULCOLAX Place 10 mg rectally daily as needed for mild constipation or moderate constipation.   buPROPion  150 MG 12 hr tablet Commonly known as: WELLBUTRIN  SR Take 1 tablet by mouth twice daily   carvedilol  3.125 MG tablet Commonly known as:  COREG  Take 4 tablets (12.5 mg total) by mouth daily. On non-dialysis days What changed: medication strength   DULoxetine  60 MG capsule Commonly known as: CYMBALTA  Take 1 capsule by mouth once daily   Enema Enem Place 1 enema rectally daily as needed (no relief from bisacodyl  suppository).   ezetimibe  10 MG tablet Commonly known as: ZETIA  Take 1 tablet (10 mg total) by mouth daily. What changed: when to take this   feeding supplement (NEPRO CARB STEADY) Liqd Take 237 mLs by mouth as needed (missed meal during dialysis.). What changed:  when to take this reasons to take this   ferrous sulfate  325 (65 FE) MG tablet Take 325 mg by mouth daily as needed (low iron at dialysis).   insulin  aspart 100 UNIT/ML FlexPen Commonly known as: NOVOLOG  Before each meal 3 times a day, 140-199 - 2 units, 200-250 - 4 units, 251-299 - 6 units,  300-349 - 8 units,  350 or above 10 units. What changed:  how much to take how to take this when to take this additional instructions   insulin  lispro protamine-lispro (75-25) 100 UNIT/ML Susp injection Commonly known as: HUMALOG  75/25 MIX Inject 0.06 mLs (6 Units total) into the skin 2 (two) times daily with a meal. Inject 25 units with breakfast, 10 units with lunch, 25 units with supper What changed:  how much to take when to take this additional instructions   ipratropium-albuterol  0.5-2.5 (3) MG/3ML Soln Commonly known as: DUONEB Take 3 mLs by nebulization once.   levothyroxine  175 MCG tablet Commonly known as: SYNTHROID  Take 175 mcg by mouth daily before breakfast.   memantine  10 MG tablet Commonly known as: Namenda  Take 1 tablet (10 mg total) by mouth 2 (two) times daily.   midodrine 10 MG tablet Commonly known as: PROAMATINE Take 1 tablet (10 mg total) by mouth 3 (three) times daily with meals.   Mircera 200 MCG/0.3ML Sosy Generic drug: Methoxy PEG-Epoetin Beta 200 mcg by Implant route daily as needed (low RBC at dialysis).  Mircera   nitroGLYCERIN  0.4 MG SL tablet Commonly known as: NITROSTAT  Place 0.4 mg under the tongue every 5 (five) minutes x 3 doses as needed for chest pain.   OVER THE COUNTER MEDICATION Apply 1 application  topically See admin instructions. Zinc barrier cream- apply to buttocks with each incontinence episode.   polyethylene glycol powder 17 GM/SCOOP powder Commonly known as: GLYCOLAX /MIRALAX  Take 17 g by mouth daily as needed for moderate constipation.   sevelamer  carbonate  0.8 g Pack packet Commonly known as: RENVELA  Take 0.8 g by mouth 3 (three) times daily with meals.   tobramycin  0.3 % ophthalmic solution Commonly known as: TOBREX  Place 1 drop into the right eye every 2 (two) hours.               Durable Medical Equipment  (From admission, onward)           Start     Ordered   12/11/23 1547  For home use only DME Other see comment  Once       Comments: Deitra Lift  Question:  Length of Need  Answer:  12 Months   12/11/23 1546            Follow-up Information     [place], [facility]. Call.   Why: Referral was sent to [facility] for follow up at home. Contact information: [address] [place] [address] [phone]         [facility]  Follow up.   Why: Someone will call you to schedule first home visit. Contact information: [address] [place] [address] [phone]                Discharge Exam: Filed Weights   12/11/23 0349 12/11/23 1257 12/11/23 1636  Weight: 94.9 kg 94.9 kg 93.9 kg   Vitals:   12/12/23 0900 12/12/23 1642  BP: (!) 123/56 (!) 124/37  Pulse: 88 76  Resp:  18  Temp: 98 F (36.7 C) 98.4 F (36.9 C)  SpO2: 95% 95%   Examination: Physical Exam:  Constitutional: WN/WD obese Caucasian chronically ill-appearing female in no acute distress appears at her baseline Respiratory: Diminished to auscultation bilaterally, no wheezing, rales,  rhonchi or crackles. Normal respiratory effort and patient is not tachypenic. No accessory muscle use.  Unlabored breathing Cardiovascular: RRR, no murmurs / rubs / gallops. S1 and S2 auscultated.  1+ extremity edema Abdomen: Soft, non-tender, distended secondary to body habitus bowel sounds positive.  GU: Deferred. Musculoskeletal: No clubbing / cyanosis of digits/nails. No joint deformity upper and lower extremities.  Has a right arm AV fistula Skin: No rashes, lesions, ulcers limited skin evaluation. No induration; Warm and dry.  Neurologic: CN 2-12 grossly intact with no focal deficits. Romberg sign cerebellar reflexes not assessed.  Psychiatric: Impaired judgment and insight. Alert and oriented x 2  Condition at discharge: stable  The results of significant diagnostics from this hospitalization (including imaging, microbiology, ancillary and laboratory) are listed below for reference.   Imaging Studies: DG CHEST PORT 1 VIEW Result Date: 12/12/2023 CLINICAL DATA:  Shortness of breath. EXAM: PORTABLE CHEST 1 VIEW COMPARISON:  12/07/2023 FINDINGS: Streaky density in the lung bases suggest atelectasis. No pulmonary edema or pleural effusion. Cardiopericardial silhouette is at upper limits of normal for size. No acute bony abnormality. Telemetry leads overlie the chest. IMPRESSION: Streaky density in the lung bases suggest atelectasis. Electronically Signed   By: Camellia Candle M.D.   On: 12/12/2023 08:11   ECHOCARDIOGRAM LIMITED Result Date: 12/08/2023    ECHOCARDIOGRAM LIMITED REPORT   Patient Name:   Jennifer [name] Date of Exam: 12/08/2023 Medical Rec #:  [id number]     Height:       67.0 in Accession #:    [id number]    Weight:       200.2 lb Date of Birth:  02-07-1956    BSA:  2.023 m Patient Age:    67 years      BP:           100/43 mmHg Patient Gender: F             HR:           78 bpm. Exam Location:  Inpatient Procedure: Limited Echo, Color Doppler and Cardiac Doppler (Both Spectral  and            Color Flow Doppler were utilized during procedure). Indications:    I31.3 Pericardial effusion (noninflammatory)  History:        Patient has prior history of Echocardiogram examinations, most                 recent 11/28/2023. CHF, COPD; Risk Factors:Hypertension, Diabetes                 and Dyslipidemia.  Sonographer:    Damien Senior RDCS Referring Phys: [id number] JESSICA MARSHALL  Sonographer Comments: Limited effusion check IMPRESSIONS  1. Left ventricular ejection fraction, by estimation, is 70 to 75%. The left ventricle has hyperdynamic function. There is mild concentric left ventricular hypertrophy.  2. Right ventricular systolic function is normal. The right ventricular size is normal.  3. The aortic valve is calcified.  4. The inferior vena cava is normal in size with greater than 50% respiratory variability, suggesting right atrial pressure of 3 mmHg. Conclusion(s)/Recommendation(s): Limited study for pericardial effusion. No effusion present. FINDINGS  Left Ventricle: Left ventricular ejection fraction, by estimation, is 70 to 75%. The left ventricle has hyperdynamic function. There is mild concentric left ventricular hypertrophy. Right Ventricle: The right ventricular size is normal. Right ventricular systolic function is normal. Pericardium: There is no evidence of pericardial effusion. Mitral Valve: Mild mitral annular calcification. Aortic Valve: The aortic valve is calcified. Venous: The inferior vena cava is normal in size with greater than 50% respiratory variability, suggesting right atrial pressure of 3 mmHg. Additional Comments: Spectral Doppler performed. Color Doppler performed.  Toribio Fuel MD Electronically signed by Toribio Fuel MD Signature Date/Time: 12/08/2023/9:35:09 AM    Final    CT Head Wo Contrast Result Date: 12/07/2023 CLINICAL DATA:  Altered mental status with no history of trauma. History of stroke. Partial dialysis obtained yesterday. Presents with  room-air hypoxia, clinically suspected pulmonary embolism. EXAM: CT HEAD WITHOUT CONTRAST CT ANGIOGRAPHY CHEST WITH CONTRAST TECHNIQUE: Contiguous axial images were obtained from the base of the skull through the vertex without intravenous contrast. Multidetector CT imaging of the chest was performed using the standard protocol during bolus administration of intravenous contrast. Multiplanar CT image reconstructions and MIPS were obtained to evaluate the vascular anatomy. RADIATION DOSE REDUCTION: This exam was performed according to the departmental dose-optimization program which includes automated exposure control, adjustment of the mA and/or kV according to patient size and/or use of iterative reconstruction technique. COMPARISON:  Head CT 11/27/2023, MRI brain 11/27/2023, CTA chest 11/28/2023, portable chest from today, and portable chest from 11/29/2023. FINDINGS: CT HEAD WITHOUT CONTRAST FINDINGS Brain: There is age advanced cerebral atrophy, small-vessel disease and atrophic ventriculomegaly, with extensive confluent small-vessel disease. There is a chronic lacunar infarct along the right corpus striatum. There bilateral chronic gangliocapsular and thalamic lacunar infarcts and bilateral chronic cerebellar infarcts. There appears to be mild brainstem edema which was noted on the prior studies, primarily in the pons. This appears no worse allowing for technical factors. No cortical based acute infarct, hemorrhage, mass or mass effect are seen, no midline  shift. Basal cisterns are patent. There are dystrophic calcifications scattered along the falx. Vascular: There are patchy calcifications in the carotid siphons, distal vertebral arteries. No hyperdense central vessel is seen. Skull: Negative for fractures or focal lesions. Sinuses/Orbits: Old lens extractions with otherwise negative orbits. Trace fluid again noted in the mastoid tips. There is mild debris in the left sphenoid air cell. Small osteoma right  ethmoid sinus. Other sinuses are clear.  Nasal septum is S shaped. Other: None. CT ANGIOGRAPHY CHEST WITH CONTRAST FINDINGS Cardiovascular: The central pulmonary arteries are slightly prominent today, with the pulmonary trunk measuring 2.8 cm, previously 2.4 cm. There are no findings of acute right heart strain such as right heart dilatation or IVC reflux. Arterial embolus is still seen in the lateral lingular segmental artery and at least 2 downstream subsegmental arteries, specifically on series 5 images 65-69. A small subsegmental arterial embolus is still present in the left lower lobe on 5:85 and 5:99 and 104. No new arterial embolic disease is seen, with suboptimal visualization of subsegmental arteries due to breathing motion. Small pericardial effusion again noted anteriorly. The heart has slightly enlarged since June 17. Heavy three-vessel coronary artery calcifications are again noted. The pulmonary veins are slightly distended. There is tortuosity and moderate atherosclerosis in the aorta and great vessels, without aneurysm, dissection or stenosis. Mediastinum/nodes: Moderate-sized hiatal hernia with patulous mildly thickened esophagus, with scattered retained fluid consistent with esophagitis and dysmotility. There is no intrathoracic or axillary adenopathy. Negative thyroid  gland, thoracic trachea, both main bronchi. Lungs/pleura: Minimal layering left pleural effusion is new since the prior study. Diffuse bronchial thickening. Patchy airspace disease consistent with pneumonia or aspiration persists in the basal segments of the left lower lobe primarily posteriorly with a small amount of nodular and ground-glass airspace disease in the superior segment. There is subtle increased scattered ground-glass disease in the posterior segment of the right upper lobe, likely due to additional pneumonia, mild posterior atelectasis in the right lower lobe. Rest of the lungs are clear.  No right pleural fluid is  seen. Upper abdomen: No acute abnormality. Old cholecystectomy. Abdominal aortic atherosclerosis. Musculoskeletal: Degenerative change and bridging enthesopathy thoracic spine. No acute or other significant osseous findings. Mild osteopenia. No chest wall mass. Review of the MIP images confirms the above findings. IMPRESSION: 1. No acute intracranial CT findings or interval changes. 2. Age advanced cerebral atrophy, small-vessel disease and chronic infarcts. 3. Persistent mild brainstem edema, primarily in the pons. 4. Persistent lingular and left lower lobe arterial emboli. No new arterial embolic disease is seen. 5. Slightly prominent central pulmonary arteries and pulmonary veins, but no findings of acute right heart strain. 6. Slight cardiomegaly is increased since June 17, with small pericardial effusion. 7. Aortic and coronary artery atherosclerosis. 8. Minimal layering left pleural effusion. 9. Persistent patchy airspace disease in the basal segments of the left lower lobe primarily posteriorly, with a small amount of nodular and ground-glass airspace disease in the superior segment. 10. Subtle increased scattered ground-glass disease in the posterior segment of the right upper lobe, likely due to additional pneumonia. 11. Hiatal hernia with patulous mildly thickened esophagus, with scattered retained fluid consistent with esophagitis and dysmotility. Aortic Atherosclerosis (ICD10-I70.0). Electronically Signed   By: Francis Quam M.D.   On: 12/07/2023 23:38   CT Angio Chest PE W and/or Wo Contrast Result Date: 12/07/2023 CLINICAL DATA:  Altered mental status with no history of trauma. History of stroke. Partial dialysis obtained yesterday. Presents with room-air hypoxia, clinically  suspected pulmonary embolism. EXAM: CT HEAD WITHOUT CONTRAST CT ANGIOGRAPHY CHEST WITH CONTRAST TECHNIQUE: Contiguous axial images were obtained from the base of the skull through the vertex without intravenous contrast.  Multidetector CT imaging of the chest was performed using the standard protocol during bolus administration of intravenous contrast. Multiplanar CT image reconstructions and MIPS were obtained to evaluate the vascular anatomy. RADIATION DOSE REDUCTION: This exam was performed according to the departmental dose-optimization program which includes automated exposure control, adjustment of the mA and/or kV according to patient size and/or use of iterative reconstruction technique. COMPARISON:  Head CT 11/27/2023, MRI brain 11/27/2023, CTA chest 11/28/2023, portable chest from today, and portable chest from 11/29/2023. FINDINGS: CT HEAD WITHOUT CONTRAST FINDINGS Brain: There is age advanced cerebral atrophy, small-vessel disease and atrophic ventriculomegaly, with extensive confluent small-vessel disease. There is a chronic lacunar infarct along the right corpus striatum. There bilateral chronic gangliocapsular and thalamic lacunar infarcts and bilateral chronic cerebellar infarcts. There appears to be mild brainstem edema which was noted on the prior studies, primarily in the pons. This appears no worse allowing for technical factors. No cortical based acute infarct, hemorrhage, mass or mass effect are seen, no midline shift. Basal cisterns are patent. There are dystrophic calcifications scattered along the falx. Vascular: There are patchy calcifications in the carotid siphons, distal vertebral arteries. No hyperdense central vessel is seen. Skull: Negative for fractures or focal lesions. Sinuses/Orbits: Old lens extractions with otherwise negative orbits. Trace fluid again noted in the mastoid tips. There is mild debris in the left sphenoid air cell. Small osteoma right ethmoid sinus. Other sinuses are clear.  Nasal septum is S shaped. Other: None. CT ANGIOGRAPHY CHEST WITH CONTRAST FINDINGS Cardiovascular: The central pulmonary arteries are slightly prominent today, with the pulmonary trunk measuring 2.8 cm,  previously 2.4 cm. There are no findings of acute right heart strain such as right heart dilatation or IVC reflux. Arterial embolus is still seen in the lateral lingular segmental artery and at least 2 downstream subsegmental arteries, specifically on series 5 images 65-69. A small subsegmental arterial embolus is still present in the left lower lobe on 5:85 and 5:99 and 104. No new arterial embolic disease is seen, with suboptimal visualization of subsegmental arteries due to breathing motion. Small pericardial effusion again noted anteriorly. The heart has slightly enlarged since June 17. Heavy three-vessel coronary artery calcifications are again noted. The pulmonary veins are slightly distended. There is tortuosity and moderate atherosclerosis in the aorta and great vessels, without aneurysm, dissection or stenosis. Mediastinum/nodes: Moderate-sized hiatal hernia with patulous mildly thickened esophagus, with scattered retained fluid consistent with esophagitis and dysmotility. There is no intrathoracic or axillary adenopathy. Negative thyroid  gland, thoracic trachea, both main bronchi. Lungs/pleura: Minimal layering left pleural effusion is new since the prior study. Diffuse bronchial thickening. Patchy airspace disease consistent with pneumonia or aspiration persists in the basal segments of the left lower lobe primarily posteriorly with a small amount of nodular and ground-glass airspace disease in the superior segment. There is subtle increased scattered ground-glass disease in the posterior segment of the right upper lobe, likely due to additional pneumonia, mild posterior atelectasis in the right lower lobe. Rest of the lungs are clear.  No right pleural fluid is seen. Upper abdomen: No acute abnormality. Old cholecystectomy. Abdominal aortic atherosclerosis. Musculoskeletal: Degenerative change and bridging enthesopathy thoracic spine. No acute or other significant osseous findings. Mild osteopenia. No  chest wall mass. Review of the MIP images confirms the above findings. IMPRESSION: 1.  No acute intracranial CT findings or interval changes. 2. Age advanced cerebral atrophy, small-vessel disease and chronic infarcts. 3. Persistent mild brainstem edema, primarily in the pons. 4. Persistent lingular and left lower lobe arterial emboli. No new arterial embolic disease is seen. 5. Slightly prominent central pulmonary arteries and pulmonary veins, but no findings of acute right heart strain. 6. Slight cardiomegaly is increased since June 17, with small pericardial effusion. 7. Aortic and coronary artery atherosclerosis. 8. Minimal layering left pleural effusion. 9. Persistent patchy airspace disease in the basal segments of the left lower lobe primarily posteriorly, with a small amount of nodular and ground-glass airspace disease in the superior segment. 10. Subtle increased scattered ground-glass disease in the posterior segment of the right upper lobe, likely due to additional pneumonia. 11. Hiatal hernia with patulous mildly thickened esophagus, with scattered retained fluid consistent with esophagitis and dysmotility. Aortic Atherosclerosis (ICD10-I70.0). Electronically Signed   By: Francis Quam M.D.   On: 12/07/2023 23:38   DG Chest Portable 1 View Result Date: 12/07/2023 CLINICAL DATA:  Hypoxia. EXAM: PORTABLE CHEST 1 VIEW COMPARISON:  November 29, 2023 FINDINGS: The heart size and mediastinal contours are within normal limits. Both lungs are clear. The visualized skeletal structures are unremarkable. IMPRESSION: No active disease. Electronically Signed   By: Lynwood Landy Raddle M.D.   On: 12/07/2023 16:51   VAS US  CAROTID (at [facility] and [facility] only) Result Date: 11/30/2023 Carotid Arterial Duplex Study Patient Name:  Jennifer [name]  Date of Exam:   11/29/2023 Medical Rec #: [id number]      Accession #:    [id number] Date of Birth: 18-Mar-1956     Patient Gender: F Patient Age:   18 years Exam Location:  [facility]  Procedure:      VAS US  CAROTID Referring Phys: EARLE DE LA TORRE --------------------------------------------------------------------------------  Indications:       CVA. Risk Factors:      Hypertension, hyperlipidemia, Diabetes, past history of                    smoking, prior CVA. Other Factors:     CKD, RUE dialysis access. Comparison Study:  06/12/2023- no hemodynamically significant stenosis by duplex                    criteria in the extracranial cerebrovascular circulation. Performing Technologist: Elmarie Lindau, RVT  Examination Guidelines: A complete evaluation includes B-mode imaging, spectral Doppler, color Doppler, and power Doppler as needed of all accessible portions of each vessel. Bilateral testing is considered an integral part of a complete examination. Limited examinations for reoccurring indications may be performed as noted.  Right Carotid Findings: +----------+--------+--------+--------+------------------+------------------+           PSV cm/sEDV cm/sStenosisPlaque DescriptionComments           +----------+--------+--------+--------+------------------+------------------+ CCA Prox  96      0                                                    +----------+--------+--------+--------+------------------+------------------+ CCA Distal62      9                                                    +----------+--------+--------+--------+------------------+------------------+  ICA Prox  59      11      1-39%                     intimal thickening +----------+--------+--------+--------+------------------+------------------+ ICA Mid   79      15                                                   +----------+--------+--------+--------+------------------+------------------+ ICA Distal71      16                                                   +----------+--------+--------+--------+------------------+------------------+ ECA       103                                                           +----------+--------+--------+--------+------------------+------------------+ +----------+--------+-------+---------------+-------------------+           PSV cm/sEDV cmsDescribe       Arm Pressure (mmHG) +----------+--------+-------+---------------+-------------------+ [id number]            dialysis access                    +----------+--------+-------+---------------+-------------------+ +---------+--------+--+--------+--+---------+ VertebralPSV cm/s54EDV cm/s10Antegrade +---------+--------+--+--------+--+---------+  Left Carotid Findings: +----------+--------+--------+--------+------------------+------------------+           PSV cm/sEDV cm/sStenosisPlaque DescriptionComments           +----------+--------+--------+--------+------------------+------------------+ CCA Prox  111     0                                                    +----------+--------+--------+--------+------------------+------------------+ CCA Distal86      13                                intimal thickening +----------+--------+--------+--------+------------------+------------------+ ICA Prox  76      20                                                   +----------+--------+--------+--------+------------------+------------------+ ICA Distal84      18                                                   +----------+--------+--------+--------+------------------+------------------+ ECA       70                                                           +----------+--------+--------+--------+------------------+------------------+ +----------+--------+--------+--------+-------------------+  PSV cm/sEDV cm/sDescribeArm Pressure (mmHG) +----------+--------+--------+--------+-------------------+ Subclavian216             Stenotic                    +----------+--------+--------+--------+-------------------+  +---------+--------+--+--------+--+---------+ VertebralPSV cm/s70EDV cm/s14Antegrade +---------+--------+--+--------+--+---------+   Summary: Right Carotid: Velocities in the right ICA are consistent with a 1-39% stenosis. Left Carotid: The extracranial vessels were near-normal with only minimal wall               thickening or plaque. Vertebrals:  Bilateral vertebral arteries demonstrate antegrade flow. Subclavians: Left subclavian artery was stenotic. Monophasic low-resistive flow              in right subclavian artery due to dialysis access. *See table(s) above for measurements and observations.  Electronically signed by Eather Popp MD on 11/30/2023 at 10:37:22 AM.    Final    VAS US  LOWER EXTREMITY VENOUS (DVT) Result Date: 11/29/2023  Lower Venous DVT Study Patient Name:  Jennifer [name]  Date of Exam:   11/29/2023 Medical Rec #: [id number]      Accession #:    [id number] Date of Birth: 22-Aug-1955     Patient Gender: F Patient Age:   66 years Exam Location:  [facility] Procedure:      VAS US  LOWER EXTREMITY VENOUS (DVT) Referring Phys: A POWELL JR --------------------------------------------------------------------------------  Indications: Pulmonary embolism.  Comparison Study: 05/21/2023 - negative Performing Technologist: Elmarie Lindau, RVT  Examination Guidelines: A complete evaluation includes B-mode imaging, spectral Doppler, color Doppler, and power Doppler as needed of all accessible portions of each vessel. Bilateral testing is considered an integral part of a complete examination. Limited examinations for reoccurring indications may be performed as noted. The reflux portion of the exam is performed with the patient in reverse Trendelenburg.  +---------+---------------+---------+-----------+----------+--------------+ RIGHT    CompressibilityPhasicitySpontaneityPropertiesThrombus Aging +---------+---------------+---------+-----------+----------+--------------+ CFV      Full            Yes      Yes                                 +---------+---------------+---------+-----------+----------+--------------+ SFJ      Full                                                        +---------+---------------+---------+-----------+----------+--------------+ FV Prox  Full                                                        +---------+---------------+---------+-----------+----------+--------------+ FV Mid   Full                                                        +---------+---------------+---------+-----------+----------+--------------+ FV DistalFull                                                        +---------+---------------+---------+-----------+----------+--------------+  PFV      Full                                                        +---------+---------------+---------+-----------+----------+--------------+ POP      Full           Yes      Yes                                 +---------+---------------+---------+-----------+----------+--------------+ PTV      Full                                                        +---------+---------------+---------+-----------+----------+--------------+ PERO     Full                                                        +---------+---------------+---------+-----------+----------+--------------+   +---------+---------------+---------+-----------+----------+--------------+ LEFT     CompressibilityPhasicitySpontaneityPropertiesThrombus Aging +---------+---------------+---------+-----------+----------+--------------+ CFV      Full           Yes      Yes                                 +---------+---------------+---------+-----------+----------+--------------+ SFJ      Full                                                        +---------+---------------+---------+-----------+----------+--------------+ FV Prox  Full                                                         +---------+---------------+---------+-----------+----------+--------------+ FV Mid   Full                                                        +---------+---------------+---------+-----------+----------+--------------+ FV DistalFull                                                        +---------+---------------+---------+-----------+----------+--------------+ PFV      Full                                                        +---------+---------------+---------+-----------+----------+--------------+  POP      Full           Yes      Yes                                 +---------+---------------+---------+-----------+----------+--------------+ PTV      Full                                                        +---------+---------------+---------+-----------+----------+--------------+ PERO                                                  not well seen  +---------+---------------+---------+-----------+----------+--------------+ The left peroneal veins are not seen well.    Summary: BILATERAL: - No evidence of deep vein thrombosis seen in the lower extremities, bilaterally. -No evidence of popliteal cyst, bilaterally.  LEFT: - There is no evidence of deep vein thrombosis in the lower extremity. However, portions of this examination were limited- see technologist comments above.  *See table(s) above for measurements and observations. Electronically signed by Fonda Rim on 11/29/2023 at 7:05:40 PM.    Final    DG Chest Port 1 View Result Date: 11/29/2023 CLINICAL DATA:  68 year old female with shortness of breath. Left lower lobe pulmonary emboli detected yesterday. EXAM: PORTABLE CHEST 1 VIEW COMPARISON:  CTA chest yesterday. FINDINGS: Portable AP semi upright view at 0633 hours. Lung volumes are stable and within normal limits. Stable cardiac size and mediastinal contours. Calcified aortic atherosclerosis. Streaky and confluent left lower lobe opacity better  demonstrated by CTA yesterday. Lung ventilation appears stable from 11/27/2023 radiographs. Visualized tracheal air column is within normal limits. No pneumothorax, edema. No definite pleural effusion. Paucity of bowel gas. Stable cholecystectomy clips. Stable visualized osseous structures. IMPRESSION: Stable ventilation. Left lower lobe pulmonary emboli and airspace opacity better demonstrated by CTA yesterday. No new cardiopulmonary abnormality. Electronically Signed   By: VEAR Hurst M.D.   On: 11/29/2023 06:59   MR ANGIO HEAD WO CONTRAST Result Date: 11/28/2023 CLINICAL DATA:  Follow-up examination for stroke. EXAM: MRA HEAD WITHOUT CONTRAST TECHNIQUE: Angiographic images of the Circle of Willis were acquired using MRA technique without intravenous contrast. COMPARISON:  Prior brain MRI from 11/27/2023 FINDINGS: Anterior circulation: Both internal carotid arteries are patent through the siphons without hemodynamically significant stenosis. No made of a tiny 1-2 mm outpouching arising from the supraclinoid left ICA (series 5, image 93). A small vessel is seen coursing from the apex of this outpouching, consistent with a small vascular infundibulum. A1 segments patent bilaterally. Normal anterior communicating artery complex. Anterior cerebral arteries patent without significant stenosis. No M1 stenosis or occlusion. Distal MCA branches perfused and symmetric. Posterior circulation: Vertebral arteries are largely codominant and patent without stenosis. Both PICA patent. Basilar patent without stenosis. Superior cerebral arteries patent bilaterally. Both PCAs primarily supplied via the basilar. Moderate bilateral P2 stenoses noted (series 1037, image 14). PCAs otherwise patent to their distal aspects. Anatomic variants: None significant. Other: No intracranial aneurysm. IMPRESSION: 1. Negative intracranial MRA for large vessel occlusion. 2. Moderate bilateral P2 stenoses. 3. Otherwise wide patency of the major  intracranial arterial  circulation. No other hemodynamically significant or correctable stenosis. Electronically Signed   By: Morene Hoard M.D.   On: 11/28/2023 16:47   ECHOCARDIOGRAM LIMITED Result Date: 11/28/2023    ECHOCARDIOGRAM LIMITED REPORT   Patient Name:   Jennifer [name] Date of Exam: 11/28/2023 Medical Rec #:  [id number]     Height:       67.0 in Accession #:    [id number]    Weight:       201.1 lb Date of Birth:  1955/08/29    BSA:          2.027 m Patient Age:    67 years      BP:           134/49 mmHg Patient Gender: F             HR:           65 bpm. Exam Location:  Inpatient Procedure: Limited Echo (Both Spectral and Color Flow Doppler were utilized            during procedure). Indications:    Pulmonary Embolus I26.09  History:        Patient has prior history of Echocardiogram examinations, most                 recent 11/21/2023.  Sonographer:    Tinnie Gosling RDCS Referring Phys: [phone] A CALDWELL POWELL JR IMPRESSIONS  1. Left ventricular ejection fraction, by estimation, is 60 to 65%. The left ventricle has normal function. The left ventricle has no regional wall motion abnormalities.  2. Right ventricular systolic function is normal. The right ventricular size is normal.  3. The mitral valve is normal in structure. No evidence of mitral valve regurgitation. No evidence of mitral stenosis.  4. The aortic valve is normal in structure. There is mild calcification of the aortic valve. Aortic valve regurgitation is not visualized. No aortic stenosis is present.  5. The inferior vena cava is normal in size with greater than 50% respiratory variability, suggesting right atrial pressure of 3 mmHg. FINDINGS  Left Ventricle: Left ventricular ejection fraction, by estimation, is 60 to 65%. The left ventricle has normal function. The left ventricle has no regional wall motion abnormalities. The left ventricular internal cavity size was normal in size. There is  no left ventricular hypertrophy.  Right Ventricle: The right ventricular size is normal. No increase in right ventricular wall thickness. Right ventricular systolic function is normal. Left Atrium: Left atrial size was normal in size. Right Atrium: Right atrial size was normal in size. Pericardium: There is no evidence of pericardial effusion. Mitral Valve: The mitral valve is normal in structure. No evidence of mitral valve stenosis. Tricuspid Valve: The tricuspid valve is normal in structure. Tricuspid valve regurgitation is not demonstrated. No evidence of tricuspid stenosis. Aortic Valve: The aortic valve is normal in structure. There is mild calcification of the aortic valve. Aortic valve regurgitation is not visualized. No aortic stenosis is present. Pulmonic Valve: The pulmonic valve was not assessed. Pulmonic valve regurgitation is not visualized. No evidence of pulmonic stenosis. Aorta: The aortic root is normal in size and structure. Venous: The inferior vena cava is normal in size with greater than 50% respiratory variability, suggesting right atrial pressure of 3 mmHg. IAS/Shunts: No atrial level shunt detected by color flow Doppler. LEFT VENTRICLE PLAX 2D LVIDd:         5.00 cm LVIDs:         4.00 cm LV  PW:         0.90 cm LV IVS:        1.00 cm  Aditya Sabharwal Electronically signed by Ria Commander Signature Date/Time: 11/28/2023/3:21:14 PM    Final    CT Angio Chest Pulmonary Embolism (PE) W or WO Contrast Addendum Date: 11/28/2023 ADDENDUM REPORT: 11/28/2023 07:13 ADDENDUM: Study discussed by telephone with Dr. Franky on 11/28/2023 at 0644 hours. Electronically Signed   By: VEAR Hurst M.D.   On: 11/28/2023 07:13   Result Date: 11/28/2023 CLINICAL DATA:  68 year old female with respiratory distress. EXAM: CT ANGIOGRAPHY CHEST WITH CONTRAST TECHNIQUE: Multidetector CT imaging of the chest was performed using the standard protocol during bolus administration of intravenous contrast. Multiplanar CT image reconstructions and MIPs  were obtained to evaluate the vascular anatomy. RADIATION DOSE REDUCTION: This exam was performed according to the departmental dose-optimization program which includes automated exposure control, adjustment of the mA and/or kV according to patient size and/or use of iterative reconstruction technique. CONTRAST:  75mL OMNIPAQUE  IOHEXOL  350 MG/ML SOLN COMPARISON:  Portable chest yesterday. CT Abdomen and Pelvis 03/22/2022. FINDINGS: Cardiovascular: Adequate contrast bolus timing in the pulmonary arterial tree. Mild respiratory motion. There is low-density pulmonary artery embolus in the left lung, lingula lobar branch series 7, image 188. But no other lobar pulmonary embolus in the left lung. Basal segmental pulmonary artery detail is limited by respiratory motion but there does appear to be posterior basal segmental pulmonary embolus on series 7, image 278, and regional abnormal lung parenchyma there (see below). No central or saddle embolus. No contralateral right lung pulmonary artery filling defect is identified. Calcified aortic atherosclerosis. Calcified coronary artery atherosclerosis. Heart size within normal limits, stable, no pericardial effusion. RV LV ratio appears to remain within normal limits. Mediastinum/Nodes: Negative for mediastinal mass or lymphadenopathy. Lungs/Pleura: Major airways are patent. There is abnormal left lower lobe opacity which is both scattered, nodular, peribronchial, and also confluent in the posterior basal segment on series 6, image 102. No pleural effusion superimposed. No lingula pulmonary infarct or confluent opacity. Contralateral streaky right costophrenic angle opacity most resembles atelectasis, and mild scarring was also present there on the 2023 comparison. No right pleural effusion. Upper Abdomen: Negative visible early contrasted appearance of the liver, spleen, pancreas, adrenal glands, left kidney, and bowel in the upper abdomen. Musculoskeletal: Flowing thoracic  endplate osteophytes resulting in some levels of interbody ankylosis. No acute or suspicious osseous lesion identified. Review of the MIP images confirms the above findings. IMPRESSION: 1. Positive for Left lung Lingula lobar and Lower lobe basal segmental branch Pulmonary emboli. No convincing CTA evidence of right heart strain. 2. No pleural effusion. No lingula infarct. Scattered, patchy, and confluent left lower lobe opacity which may be a combination of pulmonary infarct and infection/Bronchopneumonia. 3. Calcified coronary artery and Aortic Atherosclerosis (ICD10-I70.0). Electronically Signed: By: VEAR Hurst M.D. On: 11/28/2023 06:33   DG Tibia/Fibula Right Result Date: 11/27/2023 CLINICAL DATA:  Right knee and lower leg pain. EXAM: RIGHT TIBIA AND FIBULA - 2 VIEW COMPARISON:  11/27/2023. FINDINGS: There is no evidence of acute fracture or dislocation. Total knee arthroplasty changes are noted without evidence of hardware loosening. Vascular calcifications are noted in the soft tissues. Soft tissue thickening is noted anterior to the mid tibial shaft. IMPRESSION: No acute fracture or dislocation. Electronically Signed   By: Leita Birmingham M.D.   On: 11/27/2023 20:46   MR BRAIN WO CONTRAST Result Date: 11/27/2023 CLINICAL DATA:  Mental status change, unknown cause  Neuro deficit, acute, stroke suspected EXAM: MRI HEAD WITHOUT CONTRAST TECHNIQUE: Multiplanar, multiecho pulse sequences of the brain and surrounding structures were obtained without intravenous contrast. COMPARISON:  CT of the head dated November 27, 2023. FINDINGS: Brain: There is a punctate focus of high diffusion signal present along the medial surface of the anterior horn of the left lateral ventricle seen on image 27 of series 2. The diffusion-weighted images are otherwise unremarkable. There is global cerebral volume loss, which is advanced for the patient's age. There is also extensive diffuse cerebral white matter disease. Chronic  encephalomalacia changes are noted within the left cerebellar hemisphere. There is no evidence of acute hemorrhage, mass or hydrocephalus. There are numerous blooming artifacts present within the brainstem, periventricular white matter, mesial temporal lobes and the cerebellum. Vascular: Normal flow voids. Skull and upper cervical spine: Normal bone marrow signal. Sinuses/Orbits: Status post bilateral lens replacement. The paranasal sinuses are clear. Other: None. IMPRESSION: 1. Questionable punctate acute lacunar infarct involving the left periventricular white matter. 2. Extensive cerebral white matter disease and chronic encephalomalacia changes within the left cerebellar hemisphere. 3. Numerous foci of hemosiderin staining primarily within the brainstem, periventricular white matter and mesial temporal lobes. Electronically Signed   By: Evalene Coho M.D.   On: 11/27/2023 13:56   CT HEAD WO CONTRAST Result Date: 11/27/2023 CLINICAL DATA:  Mental status changes. EXAM: CT HEAD WITHOUT CONTRAST TECHNIQUE: Contiguous axial images were obtained from the base of the skull through the vertex without intravenous contrast. RADIATION DOSE REDUCTION: This exam was performed according to the departmental dose-optimization program which includes automated exposure control, adjustment of the mA and/or kV according to patient size and/or use of iterative reconstruction technique. COMPARISON:  Brain MRI 11/21/2023.  Head CT 11/20/2023 FINDINGS: Brain: There is no evidence for acute hemorrhage, hydrocephalus, mass lesion, or abnormal extra-axial fluid collection. No definite CT evidence for acute infarction. Diffuse loss of parenchymal volume is consistent with atrophy. Patchy low attenuation in the deep hemispheric and periventricular white matter is nonspecific, but likely reflects chronic microvascular ischemic demyelination. 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 Vascular: No hyperdense vessel or unexpected  calcification. Skull: No evidence for fracture. No worrisome lytic or sclerotic lesion. Sinuses/Orbits: The visualized paranasal sinuses and mastoid air cells are clear. Visualized portions of the globes and intraorbital fat are unremarkable. Other: None. IMPRESSION: 1. No acute intracranial abnormality. 2. Atrophy with chronic small vessel ischemic disease. Electronically Signed   By: Camellia Candle M.D.   On: 11/27/2023 06:09   DG Chest Port 1 View Result Date: 11/27/2023 CLINICAL DATA:  68 year old female with altered mental status and hypoxia. EXAM: PORTABLE CHEST 1 VIEW COMPARISON:  Portable chest 05/20/2023 and earlier. FINDINGS: Portable AP semi upright view at 0355 hours. Calcified aortic atherosclerosis. Other mediastinal contours are within normal limits. Lung volumes are within normal limits. Visualized tracheal air column is within normal limits. Allowing for portable technique the lungs are clear. No pneumothorax or pleural effusion. No acute osseous abnormality identified. Negative visible bowel gas. IMPRESSION: No acute cardiopulmonary abnormality. Aortic Atherosclerosis (ICD10-I70.0). Electronically Signed   By: VEAR Hurst M.D.   On: 11/27/2023 04:09   EEG adult Result Date: 11/21/2023 Shelton Arlin KIDD, MD     11/21/2023  5:33 PM Patient Name: Jennifer [name] MRN: [id number] Epilepsy Attending: Arlin KIDD Shelton Referring Physician/Provider: Shelton Arlin KIDD, MD Date: 11/21/2023 Duration: 23.40 mins Patient history: 68 year old female presented with altered mental status and left-sided weakness. EEG to evaluate for seizure Level  of alertness: Awake, asleep AEDs during EEG study: None Technical aspects: This EEG study was done with scalp electrodes positioned according to the 10-20 International system of electrode placement. Electrical activity was reviewed with band pass filter of 1-70Hz , sensitivity of 7 uV/mm, display speed of 49mm/sec with a 60Hz  notched filter applied as appropriate. EEG data were  recorded continuously and digitally stored.  Video monitoring was available and reviewed as appropriate. Description: The posterior dominant rhythm consists of 6 Hz activity of moderate voltage (25-35 uV) seen predominantly in posterior head regions, symmetric and reactive to eye opening and eye closing. Sleep was characterized by vertex waves, sleep spindles (12 to 14 Hz), maximal frontocentral region. EEG showed continuous generalized predominantly 5  to 6 Hz theta-delta slowing admixed with intermittent 2-3hz  delta slowing.  Physiologic photic driving was not seen during photic stimulation.  Hyperventilation was not performed.   ABNORMALITY - Continuous slow, generalized IMPRESSION: This study is suggestive of moderate diffuse encephalopathy. No seizures or epileptiform discharges were seen throughout the recording. Arlin MALVA Krebs   ECHOCARDIOGRAM COMPLETE Result Date: 11/21/2023    ECHOCARDIOGRAM REPORT   Patient Name:   Jennifer [name] Date of Exam: 11/21/2023 Medical Rec #:  [id number]     Height:       67.0 in Accession #:    [id number]    Weight:       209.7 lb Date of Birth:  Jan 03, 1956    BSA:          2.063 m Patient Age:    67 years      BP:           116/58 mmHg Patient Gender: F             HR:           77 bpm. Exam Location:  Inpatient Procedure: 2D Echo, Cardiac Doppler and Color Doppler (Both Spectral and Color            Flow Doppler were utilized during procedure). Indications:    Stroke I63.9  History:        Patient has prior history of Echocardiogram examinations, most                 recent 06/16/2023.  Sonographer:    Tinnie Gosling RDCS Referring Phys: [id number] TIMOTHY S OPYD IMPRESSIONS  1. Left ventricular ejection fraction, by estimation, is 65 to 70%. The left ventricle has normal function. The left ventricle has no regional wall motion abnormalities. Left ventricular diastolic parameters are indeterminate.  2. Right ventricular systolic function is normal. The right ventricular size is  normal. Tricuspid regurgitation signal is inadequate for assessing PA pressure.  3. The mitral valve is grossly normal. Trivial mitral valve regurgitation.  4. The aortic valve is tricuspid. There is mild calcification of the aortic valve. Aortic valve regurgitation is not visualized. Aortic valve sclerosis/calcification is present, without any evidence of aortic stenosis.  5. The inferior vena cava is normal in size with greater than 50% respiratory variability, suggesting right atrial pressure of 3 mmHg. Comparison(s): Prior images reviewed side by side. LVEF 65-70% range. Mildly calcified aortic valve without stenosis. Interatrial septum bows to the right, no obvious shunting. FINDINGS  Left Ventricle: Left ventricular ejection fraction, by estimation, is 65 to 70%. The left ventricle has normal function. The left ventricle has no regional wall motion abnormalities. The left ventricular internal cavity size was normal in size. There is  borderline concentric left ventricular hypertrophy. Left  ventricular diastolic parameters are indeterminate. Right Ventricle: The right ventricular size is normal. No increase in right ventricular wall thickness. Right ventricular systolic function is normal. Tricuspid regurgitation signal is inadequate for assessing PA pressure. Left Atrium: Left atrial size was normal in size. Right Atrium: Right atrial size was normal in size. Pericardium: There is no evidence of pericardial effusion. Mitral Valve: The mitral valve is grossly normal. Trivial mitral valve regurgitation. Tricuspid Valve: The tricuspid valve is grossly normal. Tricuspid valve regurgitation is trivial. Aortic Valve: The aortic valve is tricuspid. There is mild calcification of the aortic valve. There is mild aortic valve annular calcification. Aortic valve regurgitation is not visualized. Aortic valve sclerosis/calcification is present, without any evidence of aortic stenosis. Pulmonic Valve: The pulmonic valve was  grossly normal. Pulmonic valve regurgitation is trivial. Aorta: The aortic root and ascending aorta are structurally normal, with no evidence of dilitation. Venous: The inferior vena cava is normal in size with greater than 50% respiratory variability, suggesting right atrial pressure of 3 mmHg. IAS/Shunts: There is right bowing of the interatrial septum, suggestive of elevated left atrial pressure. No atrial level shunt detected by color flow Doppler. Additional Comments: 3D was performed not requiring image post processing on an independent workstation and was indeterminate.  LEFT VENTRICLE PLAX 2D LVIDd:         5.10 cm   Diastology LVIDs:         3.30 cm   LV e' medial:    8.59 cm/s LV PW:         1.10 cm   LV E/e' medial:  9.8 LV IVS:        1.00 cm   LV e' lateral:   9.79 cm/s LVOT diam:     1.90 cm   LV E/e' lateral: 8.6 LV SV:         60 LV SV Index:   29 LVOT Area:     2.84 cm  RIGHT VENTRICLE         IVC TAPSE (M-mode): 1.7 cm  IVC diam: 1.40 cm LEFT ATRIUM             Index        RIGHT ATRIUM           Index LA diam:        3.30 cm 1.60 cm/m   RA Area:     13.70 cm LA Vol (A2C):   33.8 ml 16.38 ml/m  RA Volume:   33.40 ml  16.19 ml/m LA Vol (A4C):   53.4 ml 25.88 ml/m LA Biplane Vol: 47.1 ml 22.83 ml/m  AORTIC VALVE LVOT Vmax:   112.00 cm/s LVOT Vmean:  67.700 cm/s LVOT VTI:    0.211 m  AORTA Ao Root diam: 3.20 cm Ao Asc diam:  3.70 cm MITRAL VALVE MV Area (PHT): 3.00 cm     SHUNTS MV Decel Time: 253 msec     Systemic VTI:  0.21 m MV E velocity: 84.00 cm/s   Systemic Diam: 1.90 cm MV A velocity: 119.00 cm/s MV E/A ratio:  0.71 Jayson Sierras MD Electronically signed by Jayson Sierras MD Signature Date/Time: 11/21/2023/3:04:47 PM    Final    MR ANGIO HEAD WO CONTRAST Result Date: 11/21/2023 CLINICAL DATA:  68 year old female neurologic deficit. Code stroke presentation yesterday. 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 EXAM: MRA HEAD WITHOUT CONTRAST TECHNIQUE: Angiographic images  of the Circle of Willis were acquired using MRA technique without intravenous contrast. COMPARISON:  Brain MRI today.  CTA head and neck yesterday. FINDINGS: Anterior circulation: Antegrade flow in both ICA siphons. Patent carotid termini. Bilateral siphon irregularity but no significant siphon stenosis. MCA and ACA origins appear normal. Anterior communicating artery and median artery of the corpus callosum are present. Left ACA appears dominant throughout. Severe stenosis of the proximal right ACA A2 on series 1033, image 13. Bilateral MCA M1 segments and bifurcations are patent. Multifocal moderate and severe bilateral MCA branch irregularity and stenosis as seen on series 1033, images 4 and 14. Posterior circulation: Patent distal vertebral arteries, vertebrobasilar junction, basilar artery without stenosis. Patent PCA origins. Posterior communicating arteries are diminutive or absent. Moderate to severe stenosis left PCA P2 segment (series 1021, image 4). Mild contralateral right P1 and P2 segment stenosis. Evidence of moderate and severe PCA P3 segment stenosis, including on the right side series 1021, image 17. Anatomic variants: Median artery of the corpus callosum. Other: Brain MRI today reported separately. IMPRESSION: 1. Negative for large vessel occlusion. 2. Positive for advanced intracranial atherosclerosis. No significant 1st order vessel stenosis, but moderate to severe stenosis in anterior and posterior circulation 2nd order and distal branches. 3. MRI today reported separately. Electronically Signed   By: VEAR Hurst M.D.   On: 11/21/2023 12:11   MR BRAIN WO CONTRAST Result Date: 11/21/2023 CLINICAL DATA:  68 year old female neurologic deficit. Code stroke presentation yesterday. 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 EXAM: MRI HEAD WITHOUT CONTRAST TECHNIQUE: Multiplanar, multiecho pulse sequences of the brain and surrounding structures were obtained without intravenous contrast.  COMPARISON:  CT head and CTA head and neck yesterday. Brain MRI 05/21/2023. FINDINGS: Brain: No restricted diffusion to suggest acute infarction. No midline shift, mass effect, evidence of mass lesion, ventriculomegaly, extra-axial collection or acute intracranial hemorrhage. Cervicomedullary junction and pituitary are within normal limits. Numerous chronic microhemorrhages, concentrated in the brainstem, periventricular white matter, right mesial temporal lobe as before. Superimposed advanced, confluent bilateral cerebral white matter T2 and FLAIR hyperintensity. Chronic lacunar infarcts in the bilateral deep white matter capsules, bilateral deep gray nuclei, brainstem, bilateral cerebellum. Regressed mild brainstem edema seen in December. No new signal abnormality identified. Vascular: Major intracranial vascular flow voids are stable since last year. MRA today is reported separately. Skull and upper cervical spine: Stable and negative. Sinuses/Orbits: Stable and negative. Other: Stable trace mastoid effusions. Negative visible scalp and face. IMPRESSION: 1. No acute intracranial abnormality. 2. Expected evolution of small brainstem hemorrhage since December. And otherwise stable severe underlying chronic small vessel disease. 3. MRA reported separately. Electronically Signed   By: VEAR Hurst M.D.   On: 11/21/2023 12:07   CT ANGIO HEAD NECK W WO CM (CODE STROKE) Result Date: 11/20/2023 CLINICAL DATA:  Initial evaluation for acute neuro deficit, stroke suspected. EXAM: CT ANGIOGRAPHY HEAD AND NECK WITH AND WITHOUT CONTRAST TECHNIQUE: Multidetector CT imaging of the head and neck was performed using the standard protocol during bolus administration of intravenous contrast. Multiplanar CT image reconstructions and MIPs were obtained to evaluate the vascular anatomy. Carotid stenosis measurements (when applicable) are obtained utilizing NASCET criteria, using the distal internal carotid diameter as the denominator.  RADIATION DOSE REDUCTION: This exam was performed according to the departmental dose-optimization program which includes automated exposure control, adjustment of the mA and/or kV according to patient size and/or use of iterative reconstruction technique. CONTRAST:  75mL OMNIPAQUE  IOHEXOL  350 MG/ML SOLN COMPARISON:  Comparison made with head CT from earlier the same day as well as prior CTA from  05/20/2023. FINDINGS: CTA NECK FINDINGS Aortic arch: Visualized aortic arch within normal limits for caliber with standard 3 vessel morphology. Aortic atherosclerosis. No significant stenosis about the origin the great vessels. Right carotid system: Right common and internal carotid arteries are patent without dissection. Mild atheromatous change about the right carotid bulb without hemodynamically significant greater than 50% stenosis. Left carotid system: Left common and internal carotid arteries are patent without dissection. Mild atheromatous change about the left carotid bulb without hemodynamically significant stenosis. Vertebral arteries: Both vertebral arteries arise from subclavian arteries. Vertebral arteries are patent without stenosis or dissection. Skeleton: No worrisome osseous lesions. Other neck: No other acute finding. Upper chest: No other acute finding. Review of the MIP images confirms the above findings CTA HEAD FINDINGS Anterior circulation: Atheromatous change about the carotid siphons with associated mild to moderate multifocal narrowing bilaterally. A1 segments patent bilaterally. Normal anterior communicating artery complex. Anterior cerebral arteries patent without significant stenosis. No M1 stenosis or occlusion. Distal MCA branches perfused and symmetric. Posterior circulation: Both V4 segments patent without stenosis. Both PICA patent at their origins. Basilar patent without stenosis. Superior cerebral arteries patent bilaterally. Both PCAs primarily supplied via the basilar. Atheromatous  irregularity about the PCAs bilaterally the with associated mild to moderate bilateral P2 stenoses. PCAs remain patent to their distal aspects. Venous sinuses: Patent allowing for timing the contrast bolus. Anatomic variants: None significant.  No aneurysm. Review of the MIP images confirms the above findings IMPRESSION: 1. Negative CTA for large vessel occlusion or other emergent finding. 2. Intracranial atherosclerotic disease, most notably about the carotid siphons and PCAs where there are associated mild to moderate multifocal stenoses. 3. Mild atheromatous change about the carotid bifurcations without significant stenosis. No hemodynamically significant stenosis within the neck. 4. Aortic Atherosclerosis (ICD10-I70.0). Results were called by telephone at the time of interpretation on 11/20/2023 at 10:18 pm to provider [facility] , who verbally acknowledged these results. Electronically Signed   By: Morene Hoard M.D.   On: 11/20/2023 22:19   CT HEAD CODE STROKE WO CONTRAST Result Date: 11/20/2023 CLINICAL DATA:  Code stroke. Initial evaluation for acute neuro deficit, stroke suspected. EXAM: CT HEAD WITHOUT CONTRAST TECHNIQUE: Contiguous axial images were obtained from the base of the skull through the vertex without intravenous contrast. RADIATION DOSE REDUCTION: This exam was performed according to the departmental dose-optimization program which includes automated exposure control, adjustment of the mA and/or kV according to patient size and/or use of iterative reconstruction technique. COMPARISON:  Prior study from 05/20/2023 FINDINGS: Brain: Generalized age-related cerebral atrophy. Patchy hypodensity involving the supratentorial cerebral white matter, consistent chronic small vessel ischemic disease, advanced in nature. Remote right basal ganglia lacunar infarct. Additional chronic left cerebellar infarct. No acute intracranial hemorrhage. No acute large vessel territory infarct. No mass lesion  or midline shift. No hydrocephalus or extra-axial fluid collection. Vascular: No abnormal hyperdense vessel. Calcified atherosclerosis present about the skull base. Skull: Scalp soft tissues within normal limits.  Calvarium intact. Sinuses/Orbits: Globes and orbital soft tissues within normal limits. Small osteoma noted within the right ethmoidal air cells. Paranasal sinuses are largely clear. No significant mastoid effusion. Other: None. ASPECTS [facility] Stroke Program Early CT Score) - Ganglionic level infarction (caudate, lentiform nuclei, internal capsule, insula, M1-M3 cortex): 7 - Supraganglionic infarction (M4-M6 cortex): 3 Total score (0-10 with 10 being normal): 10 IMPRESSION: 1. No acute intracranial abnormality. 2. ASPECTS is 10. 3. Atrophy with advanced chronic microvascular ischemic disease, with chronic right basal ganglia and left cerebellar  infarcts. Results were called by telephone at the time of interpretation on 11/20/2023 at 9:55 pm to provider [facility] , who verbally acknowledged these results. Electronically Signed   By: Morene Hoard M.D.   On: 11/20/2023 22:00   Microbiology: Results for orders placed or performed during the hospital encounter of 12/07/23  Blood Culture (routine x 2)     Status: None   Collection Time: 12/07/23  5:00 PM   Specimen: BLOOD LEFT HAND  Result Value Ref Range Status   Specimen Description BLOOD LEFT HAND  Final   Special Requests   Final    BOTTLES DRAWN AEROBIC AND ANAEROBIC Blood Culture results may not be optimal due to an inadequate volume of blood received in culture bottles   Culture   Final    NO GROWTH 5 DAYS Performed at [facility], [address] [place], [place] [address]    Report Status 12/12/2023 FINAL  Final  Blood Culture (routine x 2)     Status: None   Collection Time: 12/07/23  5:19 PM   Specimen: BLOOD LEFT WRIST  Result Value Ref Range Status   Specimen Description BLOOD LEFT WRIST  Final   Special  Requests   Final    BOTTLES DRAWN AEROBIC ONLY Blood Culture results may not be optimal due to an inadequate volume of blood received in culture bottles   Culture   Final    NO GROWTH 5 DAYS Performed at [facility], [address] [place], [place] [address]    Report Status 12/12/2023 FINAL  Final  MRSA Next Gen by PCR, Nasal     Status: None   Collection Time: 12/08/23 12:34 AM   Specimen: Nasal Mucosa; Nasal Swab  Result Value Ref Range Status   MRSA by PCR Next Gen NOT DETECTED NOT DETECTED Final    Comment: (NOTE) The GeneXpert MRSA Assay (FDA approved for NASAL specimens only), is one component of a comprehensive MRSA colonization surveillance program. It is not intended to diagnose MRSA infection nor to guide or monitor treatment for MRSA infections. Test performance is not FDA approved in patients less than 33 years old. Performed at [facility], [address], [place], [place] [address]    Labs: CBC: Recent Labs  Lab 12/07/23 1700 12/08/23 0405 12/10/23 1545 12/11/23 0544 12/12/23 0521  WBC 7.0 15.4* 7.3 7.4 6.9  NEUTROABS 5.5  --  5.2 4.5 3.9  HGB 8.3* 7.9* 8.5* 7.8* 7.5*  HCT 26.9* 25.1* 27.6* 25.0* 23.7*  MCV 100.0 98.0 99.6 100.0 97.5  PLT 358 352 396 381 341   Basic Metabolic Panel: Recent Labs  Lab 12/08/23 0405 12/09/23 0251 12/10/23 0835 12/10/23 1545 12/11/23 0544 12/12/23 0521  NA 138 135 136  --  135 136  K 3.7 3.9 3.8  --  4.6 3.7  CL 97* 97* 96*  --  98 95*  CO2 23 27 26   --  26 29  GLUCOSE 145* 80 92  --  126* 90  BUN 34* 44* 25*  --  45* 23  CREATININE 5.83* 6.95* 4.73*  --  6.65* 4.16*  CALCIUM  8.7* 8.3* 8.3*  --  8.5* 8.4*  MG 1.8  --   --  2.0 2.1 2.0  PHOS  --   --  4.2  --  5.3* 3.4   Liver Function Tests: Recent Labs  Lab 12/07/23 1810 12/10/23 1545 12/11/23 0544 12/12/23 0521  AST 17 14* 11* 14*  ALT 15 12 10 9   ALKPHOS 41 48 41 37*  BILITOT 0.8 0.5 0.6 0.4  PROT 4.6* 5.7* 5.4* 5.2*  ALBUMIN  2.0* 2.7*  2.6* 2.5*   CBG: Recent Labs  Lab 12/11/23 1802 12/11/23 2201 12/12/23 0732 12/12/23 1126 12/12/23 1718  GLUCAP 103* 138* 88 120* 120*   Discharge time spent: greater than 30 minutes.  Signed: Alejandro Marker, DO [facility] 12/12/2023

## 2023-12-12 NOTE — TOC Transition Note (Addendum)
 Transition of Care [facility]) - Discharge Note   Patient Details  Name: Jennifer [name] MRN: [id number] Date of Birth: April 02, 1956  [facility] CM/SW Contact:  Inocente GORMAN Kindle, LCSW Phone Number: 12/12/2023, 5:27 PM   Clinical Narrative:    CSW updated patient's daughter, Devera, that patient will be discharging today. She confirmed the hoyer was delivered last night. Family is on their way to the hospital and would like to attempt to transport her by car. CSW updated [facility] [employer] and [employer] with [facility]. No further needs identified at this time.    CSW printed Medical Necessity form to the unit printer in the event that family requires PTAR instead. RN updated.    Final next level of care: Home w Home Health Services Barriers to Discharge: Barriers Resolved   Patient Goals and CMS Choice Patient states their goals for this hospitalization and ongoing recovery are:: Return home CMS Medicare.gov Compare Post Acute Care list provided to:: Patient Represenative (must comment) Choice offered to / list presented to : Adult Children [place] ownership interest in [facility].provided to:: Adult Children    Discharge Placement                Patient to be transferred to facility by: car Name of family member notified: Clotilda Patient and family notified of of transfer: 12/12/23  Discharge Plan and Services Additional resources added to the After Visit Summary for   In-house Referral: Clinical Social Work Discharge Planning Services: CM Consult Post Acute Care Choice: Home Health, Durable Medical Equipment          DME Arranged: Other see comment Lajean lift) DME Agency: [employer] Date DME Agency Contacted: 12/11/23 Time DME Agency Contacted: [phone] Representative spoke with at DME Agency: [facility] HH Arranged: PT, OT, RN, Nurse's Aide, Social Work [employer] Agency: [facility] Date HH Agency Contacted: 12/11/23 Time HH Agency  Contacted: 1544 Representative spoke with at [facility] Agency: Donette  Social Drivers of Health (SDOH) Interventions SDOH Screenings   Food Insecurity: No Food Insecurity (11/21/2023)  Housing: Low Risk  (11/21/2023)  Transportation Needs: No Transportation Needs (11/21/2023)  Utilities: Not At Risk (11/21/2023)  Alcohol  Screen: Low Risk  (03/07/2023)  Depression (PHQ2-9): High Risk (05/30/2023)  Financial Resource Strain: Low Risk  (03/07/2023)  Physical Activity: Inactive (03/07/2023)  Social Connections: Socially Integrated (11/21/2023)  Stress: No Stress Concern Present (03/07/2023)  Tobacco Use: Medium Risk (12/07/2023)  Health Literacy: Adequate Health Literacy (03/07/2023)     Readmission Risk Interventions    12/11/2023    1:59 PM  Readmission Risk Prevention Plan  Transportation Screening Complete  Medication Review (RN Care Manager) Complete  PCP or Specialist appointment within 3-5 days of discharge Complete  HRI or Home Care Consult Complete  SW Recovery Care/Counseling Consult Complete  Palliative Care Screening Not Applicable  Skilled Nursing Facility Patient Refused

## 2023-12-12 NOTE — Plan of Care (Signed)
  Problem: Coping: Goal: Ability to adjust to condition or change in health will improve Outcome: Adequate for Discharge   Problem: Fluid Volume: Goal: Ability to maintain a balanced intake and output will improve Outcome: Adequate for Discharge   Problem: Health Behavior/Discharge Planning: Goal: Ability to identify and utilize available resources and services will improve Outcome: Adequate for Discharge Goal: Ability to manage health-related needs will improve Outcome: Adequate for Discharge   Problem: Metabolic: Goal: Ability to maintain appropriate glucose levels will improve Outcome: Adequate for Discharge   Problem: Nutritional: Goal: Maintenance of adequate nutrition will improve Outcome: Adequate for Discharge Goal: Progress toward achieving an optimal weight will improve Outcome: Adequate for Discharge   Problem: Skin Integrity: Goal: Risk for impaired skin integrity will decrease Outcome: Adequate for Discharge   Problem: Tissue Perfusion: Goal: Adequacy of tissue perfusion will improve Outcome: Adequate for Discharge   Problem: Education: Goal: Knowledge of General Education information will improve Description: Including pain rating scale, medication(s)/side effects and non-pharmacologic comfort measures Outcome: Adequate for Discharge   Problem: Health Behavior/Discharge Planning: Goal: Ability to manage health-related needs will improve Outcome: Adequate for Discharge   Problem: Clinical Measurements: Goal: Ability to maintain clinical measurements within normal limits will improve Outcome: Adequate for Discharge Goal: Will remain free from infection Outcome: Adequate for Discharge Goal: Diagnostic test results will improve Outcome: Adequate for Discharge Goal: Respiratory complications will improve Outcome: Adequate for Discharge Goal: Cardiovascular complication will be avoided Outcome: Adequate for Discharge   Problem: Activity: Goal: Risk for  activity intolerance will decrease Outcome: Adequate for Discharge   Problem: Nutrition: Goal: Adequate nutrition will be maintained Outcome: Adequate for Discharge   Problem: Coping: Goal: Level of anxiety will decrease Outcome: Adequate for Discharge   Problem: Elimination: Goal: Will not experience complications related to bowel motility Outcome: Adequate for Discharge Goal: Will not experience complications related to urinary retention Outcome: Adequate for Discharge   Problem: Pain Managment: Goal: General experience of comfort will improve and/or be controlled Outcome: Adequate for Discharge   Problem: Safety: Goal: Ability to remain free from injury will improve Outcome: Adequate for Discharge   Problem: Skin Integrity: Goal: Risk for impaired skin integrity will decrease Outcome: Adequate for Discharge   Problem: Education: Goal: Knowledge of disease and its progression will improve Outcome: Adequate for Discharge Goal: Individualized Educational Video(s) Outcome: Adequate for Discharge   Problem: Fluid Volume: Goal: Compliance with measures to maintain balanced fluid volume will improve Outcome: Adequate for Discharge   Problem: Health Behavior/Discharge Planning: Goal: Ability to manage health-related needs will improve Outcome: Adequate for Discharge   Problem: Nutritional: Goal: Ability to make healthy dietary choices will improve Outcome: Adequate for Discharge   Problem: Clinical Measurements: Goal: Complications related to the disease process, condition or treatment will be avoided or minimized Outcome: Adequate for Discharge

## 2023-12-12 NOTE — Progress Notes (Signed)
 Attempted to contact patients daughter Clotilda to notify of d/c orders placed.  No answer.

## 2023-12-12 NOTE — Plan of Care (Signed)
   Problem: Education: Goal: Ability to describe self-care measures that may prevent or decrease complications (Diabetes Survival Skills Education) will improve Outcome: Completed/Met

## 2023-12-12 NOTE — Progress Notes (Signed)
 Physical Therapy Treatment Patient Details Name: Jennifer [name] MRN: [id number] DOB: [birth year] Today's Date: 12/12/2023   History of Present Illness Jennifer [name] is a 68 y.o. presenting to [facility] ED on 6/26 with 2 recent hospitalizations due to AMS and hypoxia. Pt diagnosed with sepsis with acute hypoxic respiratory failure without septic shock. PMH:  hypertension, CAD, CVA, diabetes mellitus type 2, ESRD on HD, hypothyroidism, anxiety, depression, and memory loss, altered mental status and hypoglycemia.    PT Comments  Pt supine in bed on arrival listing to R side in bed.  Performed bed mobility and standing trials at edge of bed.  Pt continues to require mod to max assistance to move into standing but remains flexed in stance.  Continue to recommend rehab in a post acute setting to maximize functional gains and decrease caregiver burden.      If plan is discharge home, recommend the following: A lot of help with walking and/or transfers;Help with stairs or ramp for entrance;Assistance with cooking/housework;Supervision due to cognitive status   Can travel by private vehicle     No  Equipment Recommendations  Hospital bed;Hoyer lift;Wheelchair (measurements PT);Wheelchair cushion (measurements PT);Other (comment)    Recommendations for Other Services       Precautions / Restrictions Precautions Precautions: Fall Recall of Precautions/Restrictions: Impaired Restrictions Weight Bearing Restrictions Per Provider Order: No     Mobility  Bed Mobility Overal bed mobility: Needs Assistance Bed Mobility: Supine to Sit, Sit to Supine     Supine to sit: Mod assist Sit to supine: Mod assist   General bed mobility comments: Increased time and effort to move to edge of bed.  Once in sitting required max assistance to scoot forward but all other aspects required mod assistance.  Pt able to intermittently follow commands for hand placement.  Pt reaching for rail with weight shifting forward and  then reaches behind her as if she is distracted.  Once in supine bed placed in trendelenberg and pt able to use head boar and assist with bed pad to move to [facility].    Transfers Overall transfer level: Needs assistance Equipment used: Rolling walker (2 wheels) Transfers: Sit to/from Stand, Bed to chair/wheelchair/BSC Sit to Stand: +2 physical assistance, Max assist           General transfer comment: Performed face to face transfer this session.  Pt able to lift hip from bed but lacks hip extension and trunk extension.  Performed 3rd trial and able to stand with improved hip extension but tolerance remains poor.  Placed RW in front of patient but no initiation made to achieve standing.    Ambulation/Gait Ambulation/Gait assistance:  (unable)                 Stairs             Wheelchair Mobility     Tilt Bed    Modified Rankin (Stroke Patients Only)       Balance Overall balance assessment: Needs assistance Sitting-balance support: Feet supported, No upper extremity supported Sitting balance-Leahy Scale: Fair Sitting balance - Comments: fair/good seated at EOB and on BSC     Standing balance-Leahy Scale: Poor Standing balance comment: requires external support                            Communication Communication Communication: Impaired Factors Affecting Communication: Difficulty expressing self  Cognition Arousal: Alert Behavior During Therapy: Flat affect  PT - Cognitive impairments: Orientation   Orientation impairments: Place, Time, Situation                     Following commands: Impaired Following commands impaired: Follows one step commands inconsistently    Cueing Cueing Techniques: Verbal cues, Tactile cues  Exercises      General Comments        Pertinent Vitals/Pain Pain Assessment Pain Assessment: Faces Pain Score: 0-No pain    Home Living                          Prior Function             PT Goals (current goals can now be found in the care plan section) Acute Rehab PT Goals Patient Stated Goal: To improve mobility for pt to return home. Potential to Achieve Goals: Fair Progress towards PT goals: Progressing toward goals    Frequency    Min 1X/week      PT Plan      Co-evaluation              AM-PAC PT 6 Clicks Mobility   Outcome Measure  Help needed turning from your back to your side while in a flat bed without using bedrails?: A Lot Help needed moving from lying on your back to sitting on the side of a flat bed without using bedrails?: A Lot Help needed moving to and from a bed to a chair (including a wheelchair)?: Total Help needed standing up from a chair using your arms (e.g., wheelchair or bedside chair)?: Total Help needed to walk in hospital room?: Total Help needed climbing 3-5 steps with a railing? : Total 6 Click Score: 8    End of Session Equipment Utilized During Treatment: Gait belt Activity Tolerance: Patient tolerated treatment well;Patient limited by fatigue Patient left: in bed;with call bell/phone within reach;with bed alarm set;with family/visitor present Nurse Communication: Mobility status (placed in chair position d/t cognition.) PT Visit Diagnosis: Unsteadiness on feet (R26.81);Other abnormalities of gait and mobility (R26.89);Muscle weakness (generalized) (M62.81)     Time: 8555-8498 PT Time Calculation (min) (ACUTE ONLY): 17 min  Charges:    $Therapeutic Activity: 8-22 mins PT General Charges $$ ACUTE PT VISIT: 1 Visit                     Toya HAMS , PTA Acute Rehabilitation Services Office [phone]    Wylodean Shimmel JINNY Gosling 12/12/2023, 3:11 PM

## 2023-12-13 ENCOUNTER — Telehealth: Payer: Self-pay

## 2023-12-13 LAB — HEPATITIS B SURFACE ANTIBODY, QUANTITATIVE: Hep B S AB Quant (Post): <3.5 m[IU]/mL — ABNORMAL LOW

## 2023-12-13 NOTE — Transitions of Care (Post Inpatient/ED Visit) (Signed)
   12/13/2023  Name: Jennifer [name] MRN: [id number] DOB: May 24, 1956  Today's TOC FU Call Status: Today's TOC FU Call Status:: Unsuccessful Call (1st Attempt) Unsuccessful Call (1st Attempt) Date: 12/13/23  Attempted to reach the patient regarding the most recent Inpatient/ED visit. Unable to leave a voicemail message.  Follow Up Plan: Additional outreach attempts will be made to reach the patient to complete the Transitions of Care (Post Inpatient/ED visit) call.   Richerd Fish, RN, BSN, CCM [facility], [facility] RN Care Manager Direct Dial : [phone]

## 2023-12-14 ENCOUNTER — Telehealth: Payer: Self-pay

## 2023-12-14 DIAGNOSIS — E162 Hypoglycemia, unspecified: Secondary | ICD-10-CM | POA: Insufficient documentation

## 2023-12-14 NOTE — Transitions of Care (Post Inpatient/ED Visit) (Signed)
 12/14/2023  Name: Jennifer [name] MRN: [id number] DOB: [birth year]  Today's TOC FU Call Status: Today's TOC FU Call Status:: Successful TOC FU Call Completed TOC FU Call Complete Date: 12/14/23 Patient's Name and Date of Birth confirmed.  Transition Care Management Follow-up Telephone Call Date of Discharge: 12/12/23 Discharge Facility: [facility]) Type of Discharge: Inpatient Admission Primary Inpatient Discharge Diagnosis:: Shock; ESRD How have you been since you were released from the hospital?: Better Any questions or concerns?: Yes Patient Questions/Concerns:: Medications from hospital and rehab meds differ  Items Reviewed: Did you receive and understand the discharge instructions provided?: Yes Medications obtained,verified, and reconciled?: Yes (Medications Reviewed) Any new allergies since your discharge?: No Dietary orders reviewed?: No Do you have support at home?: Yes People in Home [RPT]: child(ren), adult Name of Support/Comfort Primary Source: Clotilda  Medications Reviewed Today: Medications Reviewed Today     Reviewed by Eilleen Richerd GRADE, RN (Registered Nurse) on 12/14/23 at 1259  Med List Status: <None>   Medication Order Taking? Sig Documenting Provider Last Dose Status Informant  apixaban  (ELIQUIS ) 5 MG TABS tablet [id number]  Take 1 tablet (5 mg total) by mouth 2 (two) times daily. Take 5 mg by mouth twice daily Sheikh, Omair Latif, [place]  Active   aspirin  81 MG chewable tablet [id number] No Chew 1 tablet (81 mg total) by mouth daily. de Clint Kill, Earle E, NP 12/07/2023 Active Nursing Home Medication Administration Guide (MAG)           Med Note (CRUTHIS, CHLOE C   Fri Dec 08, 2023  7:15 AM)    atorvastatin  (LIPITOR ) 80 MG tablet [id number] No TAKE 1 TABLET BY MOUTH ONCE DAILY AT  6  IN  THE  EVENING  Patient taking differently: Take 80 mg by mouth every evening.   Mallipeddi, Diannah SQUIBB, MD 12/06/2023 Active Nursing Home Medication Administration Guide (MAG)   bisacodyl  (DULCOLAX) 10 MG suppository [id number] No Place 10 mg rectally daily as needed for mild constipation or moderate constipation. [provider] Unknown Active Nursing Home Medication Administration Guide (MAG)           Med Note (CRUTHIS, CHLOE C   Fri Dec 08, 2023  7:39 AM) No last doses listed on MAR   buPROPion  (WELLBUTRIN  SR) 150 MG 12 hr tablet [id number] No Take 1 tablet by mouth twice daily [name], Laine LABOR, MD 12/07/2023 Active Nursing Home Medication Administration Guide (MAG)  carvedilol  (COREG ) 3.125 MG tablet [id number]  Take 4 tablets (12.5 mg total) by mouth daily. On non-dialysis days Sherrill Cable Latif, DO  Active   DULoxetine  (CYMBALTA ) 60 MG capsule [id number] No Take 1 capsule by mouth once daily [name], Laine LABOR, MD 12/07/2023 Active Nursing Home Medication Administration Guide (MAG)           Med Note (CRUTHIS, CHLOE C   Fri Dec 08, 2023  7:15 AM)    ezetimibe  (ZETIA ) 10 MG tablet [id number] No Take 1 tablet (10 mg total) by mouth daily.  Patient taking differently: Take 10 mg by mouth every evening.   Singh, Prashant K, MD 12/06/2023 Active Nursing Home Medication Administration Guide (MAG)  ferrous sulfate  325 (65 FE) MG tablet [id number] No Take 325 mg by mouth daily as needed (low iron at dialysis). [provider] Unknown Active Nursing Home Medication Administration Guide (MAG)           Med Note (CRUTHIS, CHLOE C   Fri Dec 08, 2023  7:40 AM) No last doses  listed on MAR  insulin  aspart (NOVOLOG ) 100 UNIT/ML FlexPen [id number] No Before each meal 3 times a day, 140-199 - 2 units, 200-250 - 4 units, 251-299 - 6 units,  300-349 - 8 units,  350 or above 10 units.  Patient taking differently: Inject 0-10 Units into the skin in the morning, at noon, and at bedtime. BS 0-139 inject 0 units  BS 149-199 inject 2 units  200-250 inject 4 units  251-299 inject 6 units  300-349 inject 8 units  350-999 inject 10 units and call MD   Singh, Prashant K, MD 12/05/2023  Active Nursing Home Medication Administration Guide (MAG)  insulin  lispro protamine-lispro (HUMALOG  75/25 MIX) (75-25) 100 UNIT/ML SUSP injection [id number] No Inject 0.06 mLs (6 Units total) into the skin 2 (two) times daily with a meal. Inject 25 units with breakfast, 10 units with lunch, 25 units with supper  Patient taking differently: Inject 10-25 Units into the skin See admin instructions.  Inject 25 units in the morning and 25 units at bedtime. Inject 10 units in the afternoon   Singh, Prashant K, MD 12/07/2023 Active Nursing Home Medication Administration Guide (MAG)  ipratropium-albuterol  (DUONEB) 0.5-2.5 (3) MG/3ML SOLN [id number] No Take 3 mLs by nebulization once. [provider] 11/27/2023 Active Nursing Home Medication Administration Guide (MAG)  levothyroxine  (SYNTHROID ) 175 MCG tablet [id number] No Take 175 mcg by mouth daily before breakfast. [provider] 12/07/2023 Active Nursing Home Medication Administration Guide (MAG)           Med Note (CRUTHIS, CHLOE C   Fri Dec 08, 2023  7:15 AM)    memantine  (NAMENDA ) 10 MG tablet [id number] No Take 1 tablet (10 mg total) by mouth 2 (two) times daily. Rosemarie Eather RAMAN, MD 12/07/2023 Active Nursing Home Medication Administration Guide (MAG)  Methoxy PEG-Epoetin Beta (MIRCERA) 200 MCG/0.3ML SOSY [id number] No 200 mcg by Implant route daily as needed (low RBC at dialysis). Mircera [provider] Unknown Active Nursing Home Medication Administration Guide (MAG)           Med Note (CRUTHIS, CHLOE C   Fri Dec 08, 2023  7:43 AM) No last doses listed on MAR   midodrine (PROAMATINE) 10 MG tablet [id number]  Take 1 tablet (10 mg total) by mouth 3 (three) times daily with meals. Sheikh, Omair Latif, DO  Active   nitroGLYCERIN  (NITROSTAT ) 0.4 MG SL tablet [id number] No Place 0.4 mg under the tongue every 5 (five) minutes x 3 doses as needed for chest pain. [provider] Unknown Active Nursing Home Medication Administration  Guide (MAG)           Med Note (CRUTHIS, CHLOE C   Fri Dec 08, 2023  7:43 AM) No last doses listed on MAR   Nutritional Supplements (FEEDING SUPPLEMENT, NEPRO CARB STEADY,) LIQD [id number] No Take 237 mLs by mouth as needed (missed meal during dialysis.).  Patient taking differently: Take 237 mLs by mouth daily as needed (for missed meals).   Maree Bracken D, DO Unknown Active Nursing Home Medication Administration Guide (MAG)           Med Note (CRUTHIS, CHLOE C   Fri Dec 08, 2023  7:44 AM) No last doses listed on [facility]   OVER THE COUNTER MEDICATION [id number] No Apply 1 application  topically See admin instructions. Zinc barrier cream- apply to buttocks with each incontinence episode. [provider] 12/06/2023 Active Nursing Home Medication Administration Guide (MAG)  polyethylene glycol powder (GLYCOLAX /MIRALAX ) 17 GM/SCOOP powder [id number]  Take 17 g by mouth daily as needed for moderate constipation. Sheikh, Omair Ralston, DO  Active   sevelamer  carbonate (RENVELA ) 0.8 g PACK packet [id number] No Take 0.8 g by mouth 3 (three) times daily with meals. [provider] 12/07/2023 Active Nursing Home Medication Administration Guide (MAG)           Med Note (CRUTHIS, CHLOE C   Fri Dec 08, 2023  7:15 AM)    Sodium Phosphates (ENEMA) ENEM [id number] No Place 1 enema rectally daily as needed (no relief from bisacodyl  suppository). [provider] Unknown Active Nursing Home Medication Administration Guide (MAG)           Med Note (CRUTHIS, CHLOE C   Fri Dec 08, 2023  7:40 AM) No last doses listed on MAR   tobramycin  (TOBREX ) 0.3 % ophthalmic solution [id number] No Place 1 drop into the right eye every 2 (two) hours. [provider] 12/07/2023 Active Nursing Home Medication Administration Guide (MAG)           Med Note (CRUTHIS, CHLOE C   Fri Dec 08, 2023  7:15 AM)    Med List Note Leobardo Garre, CPhT 11/27/23 9149): [phone] [facility];  Dialysis on  Mon-Wed-Fri            Home Care and Equipment/Supplies: Were Home Health Services Ordered?: Yes Name of Home Health Agency:: [facility] Has Agency set up a time to come to your home?: Yes First Home Health Visit Date: 12/14/23 [facility]) Any new equipment or medical supplies ordered?: Yes Name of Medical supply agency?: [facility] Were you able to get the equipment/medical supplies?: Yes Do you have any questions related to the use of the equipment/supplies?: No  Functional Questionnaire: unable daughter at work     Follow up appointments reviewed: CMA able to make a virtual appointment for 12/26/23 at 12 noon with Dr. Laine Balls due to patient is weak Declines 30 day TOC as patient has HH and Palliative Care following and home hemodialysis with daughter providing.     Richerd Fish, RN, BSN, CCM [facility], [facility] Health RN Care Manager Direct Dial : [phone]

## 2023-12-23 ENCOUNTER — Other Ambulatory Visit: Payer: Self-pay | Admitting: Neurology

## 2023-12-25 ENCOUNTER — Telehealth: Payer: Self-pay

## 2023-12-25 DIAGNOSIS — F0284 Dementia in other diseases classified elsewhere, unspecified severity, with anxiety: Secondary | ICD-10-CM

## 2023-12-25 DIAGNOSIS — I2699 Other pulmonary embolism without acute cor pulmonale: Secondary | ICD-10-CM

## 2023-12-25 DIAGNOSIS — N186 End stage renal disease: Secondary | ICD-10-CM | POA: Diagnosis not present

## 2023-12-25 DIAGNOSIS — I3139 Other pericardial effusion (noninflammatory): Secondary | ICD-10-CM

## 2023-12-25 DIAGNOSIS — E11649 Type 2 diabetes mellitus with hypoglycemia without coma: Secondary | ICD-10-CM

## 2023-12-25 DIAGNOSIS — E039 Hypothyroidism, unspecified: Secondary | ICD-10-CM

## 2023-12-25 DIAGNOSIS — F32A Depression, unspecified: Secondary | ICD-10-CM

## 2023-12-25 DIAGNOSIS — F0283 Dementia in other diseases classified elsewhere, unspecified severity, with mood disturbance: Secondary | ICD-10-CM

## 2023-12-25 DIAGNOSIS — I132 Hypertensive heart and chronic kidney disease with heart failure and with stage 5 chronic kidney disease, or end stage renal disease: Secondary | ICD-10-CM | POA: Diagnosis not present

## 2023-12-25 DIAGNOSIS — I69354 Hemiplegia and hemiparesis following cerebral infarction affecting left non-dominant side: Secondary | ICD-10-CM

## 2023-12-25 DIAGNOSIS — I509 Heart failure, unspecified: Secondary | ICD-10-CM | POA: Diagnosis not present

## 2023-12-25 DIAGNOSIS — E1122 Type 2 diabetes mellitus with diabetic chronic kidney disease: Secondary | ICD-10-CM | POA: Diagnosis not present

## 2023-12-25 NOTE — Telephone Encounter (Signed)
 Okay for VV if patient unable to come into the office. Please keep as 30 minute length. Thank you.

## 2023-12-25 NOTE — Telephone Encounter (Signed)
 Attempted to call Pt daughter to discuss appt on 12/27/23. No answer, LVM for call back.

## 2023-12-25 NOTE — Telephone Encounter (Deleted)
 Jennifer [name]

## 2023-12-25 NOTE — Telephone Encounter (Signed)
 Pt daughter, Tillman, cld to request the OV on 12/27/23 with NP can be VV. Dtr went on to report Pt is currently at home but unable to get in the car. Family is using [facility]oyer to transfer Pt. Pt had a fall & lost consciousness, went to ED at [facility], then trasferred to [facility] for suspected TIA. From stay at [facility] went to [facility]. Went unresponsive while there and sent to [facility] for eval. Had a hospital stay. Went back to [place], went unresponsive again, sent back to [facility], had hospitalization and from there went home.  Informed Dtr that due to the multiple issues and changes, we will need to speak with MD and NP to determine if VV possible. Will message providers and call her back once we receive a response. Dtr voiced understanding.

## 2023-12-25 NOTE — Telephone Encounter (Signed)
 Pt daughter called back. Made her aware NP noted visit for 12/27/23 can be a [facility] VV. Dtr voiced understanding.

## 2023-12-26 ENCOUNTER — Telehealth: Admitting: Family Medicine

## 2023-12-26 ENCOUNTER — Encounter: Payer: Self-pay | Admitting: Family Medicine

## 2023-12-26 VITALS — BP 126/62 | HR 66 | Temp 97.6°F

## 2023-12-26 DIAGNOSIS — I10 Essential (primary) hypertension: Secondary | ICD-10-CM

## 2023-12-26 DIAGNOSIS — E11649 Type 2 diabetes mellitus with hypoglycemia without coma: Secondary | ICD-10-CM

## 2023-12-26 DIAGNOSIS — E782 Mixed hyperlipidemia: Secondary | ICD-10-CM

## 2023-12-26 DIAGNOSIS — N186 End stage renal disease: Secondary | ICD-10-CM

## 2023-12-26 DIAGNOSIS — Z515 Encounter for palliative care: Secondary | ICD-10-CM

## 2023-12-26 DIAGNOSIS — Z8673 Personal history of transient ischemic attack (TIA), and cerebral infarction without residual deficits: Secondary | ICD-10-CM

## 2023-12-26 DIAGNOSIS — I639 Cerebral infarction, unspecified: Secondary | ICD-10-CM

## 2023-12-26 DIAGNOSIS — F4323 Adjustment disorder with mixed anxiety and depressed mood: Secondary | ICD-10-CM

## 2023-12-26 DIAGNOSIS — J449 Chronic obstructive pulmonary disease, unspecified: Secondary | ICD-10-CM

## 2023-12-26 DIAGNOSIS — D638 Anemia in other chronic diseases classified elsewhere: Secondary | ICD-10-CM

## 2023-12-26 DIAGNOSIS — E039 Hypothyroidism, unspecified: Secondary | ICD-10-CM

## 2023-12-26 DIAGNOSIS — G9341 Metabolic encephalopathy: Secondary | ICD-10-CM | POA: Diagnosis not present

## 2023-12-26 DIAGNOSIS — Z794 Long term (current) use of insulin: Secondary | ICD-10-CM

## 2023-12-26 DIAGNOSIS — I5032 Chronic diastolic (congestive) heart failure: Secondary | ICD-10-CM

## 2023-12-26 DIAGNOSIS — Z992 Dependence on renal dialysis: Secondary | ICD-10-CM

## 2023-12-26 DIAGNOSIS — R579 Shock, unspecified: Secondary | ICD-10-CM

## 2023-12-26 MED ORDER — BUPROPION HCL ER (SR) 150 MG PO TB12: 150.0000 mg | ORAL_TABLET | Freq: Two times a day (BID) | ORAL | 3 refills | Status: DC

## 2023-12-26 MED ORDER — DULOXETINE HCL 60 MG PO CPEP: 60.0000 mg | ORAL_CAPSULE | Freq: Every day | ORAL | 3 refills | Status: AC

## 2023-12-26 NOTE — Assessment & Plan Note (Signed)
 With uti and prior to that LLL pna Reviewed hospital records, lab results and studies in detail  Is home with home health   Doing better

## 2023-12-26 NOTE — Progress Notes (Signed)
 Virtual Visit via Video Note  I connected with Jennifer [name] on 12/26/23 at 12:00 PM EDT by a video enabled telemedicine application and verified that I am speaking with the correct person using two identifiers.  Patient Location: Home Provider Location: Office/Clinic  I discussed the limitations, risks, security, and privacy concerns of performing an evaluation and management service by video and the availability of in person appointments. I also discussed with the patient that there may be a patient responsible charge related to this service. The patient expressed understanding and agreed to proceed.  Parties involved in encounter  Patient: Jennifer [name]  Daughter: Jennifer [name]   Provider:  Laine Balls MD    Subjective: PCP: Balls Laine LABOR, MD  Chief Complaint  Patient presents with   Hospitalization Follow-up   HPI Pt presents for follow up of hospitalization for sepsis and metabolic encephalopathy with uti and pna  Recommended 1-2 wk cbc, cmp, mag, phos  and nephrology follow up   Has been to hospital and rehab center several times  Most recent hosp was 6/26 to 7/1  Per discharge summary   Assessment and Plan:   Metabolic Encephalopathy, improved: Initially has a unclear baseline but was unresponsive on admission.  Suspect poor nutrition failure to thrive mild hypotension possible UTI. Continue to Minimize central acting meds. Improving overall with hydration and time and treatment of possible infection. Delirium Precautions PT OT had recommended SNF but patient's daughter wants to take her home with home health whenever she is doing better; still remains fairly confused but family reported that she is back to her baseline   Hypotension, presumed septic shock: With source is possible UTI given recent history of the same as well as her report of dysuria (although hard to cooperate given her AMS). C/w MAP goal greater than 65, weaned off norepinephrine  6/27; C/w Midodrine  10  mg po TID. Continuing IV Abc with Cefepime  and planning on 5 days Tx. UA with leukocytes, pyuria, although there are some squamous cells so may not be accurate.  Blood pressures are stable and will continue midodrine  at discharge  Urine culture grew out e coli and proteus- treated with cephalosporin IV    History of dementia: Initially was unclear if encephalopathy is far from baseline, overall seems improved. Continue home Memantine  10 mg po BID.  Family reports that she is at her baseline now   Hypothyroidism: Check TSH in the AM. C/w Levothyroxine  175 mcg po Daily   History of PE: Continue home Apixaban  5 mg po BID.  Apparently did not have a prescription for this at discharge so have written for   Hx of CVA with L Sided Deficits: Resume ASA 81 mg po Daily and Atorvastatin  80 mg po at bedtime. PT/OT to evaluate and Treat and recommending SNF with patient's daughter wants to take her home with home health   HLD: C/w Atorvastatin  80 mg po Daily and Ezetimibe  10 mg po at bedtime   Diabetes Mellitus Type 2: Her Home Novology and Humalog  Mix currently being held. Will place on Very Sensitive Novolog  SSI AC.  CBGs ranged from 103-175   Depression and Anxiety: C/w Bupropion  150 mg po BID and will resume Duloxetine  60 mg po Daily   ESRD on HD: BUN/Cr Trend:          Recent Labs  Lab 12/04/23 1633 12/07/23 1810 12/08/23 0405 12/09/23 0251 12/10/23 0835 12/11/23 0544 12/12/23 0521  BUN 58* 29* 34* 44* 25* 45* 23  CREATININE  7.20* 4.72* 5.83* 6.95* 4.73* 6.65* 4.16*  -C/w Sevelamer  Carbonate 0.8 grams TIDwm -Avoid Nephrotoxic Medications, Contrast Dyes, Hypotension and Dehydration to Ensure Adequate Renal Perfusion and will need to Renally Adjust Meds. CTM and Trend Renal Function carefully and repeat CMP in the AM  -Nephrology consulted and dialyzed her yesterday and she will continue maintenance dialysis in outpatient setting   Normocytic Anemia/Anemia of Chronic Kidney Disease: Hgb/Hct  Trend:           Recent Labs  Lab 12/04/23 1633 12/07/23 1616 12/07/23 1700 12/08/23 0405 12/10/23 1545 12/11/23 0544 12/12/23 0521  HGB 8.2* 8.2* 8.3* 7.9* 8.5* 7.8* 7.5*  HCT 26.3* 24.0* 26.9* 25.1* 27.6* 25.0* 23.7*  MCV 99.2  --  100.0 98.0 99.6 100.0 97.5  -Will resume her home Ferrous Sulfate  325 mg  po Daily -Checked Anemia Panel and it showed an iron level of 79, UIBC 172, TIBC 251, saturation ratio 32%, ferritin level 339, folate level 12.1 and a vitamin B12 of 508. CTM for S/Sx of Bleeding; No overt bleeding noted. Repeat CBC in the AM   Hypoalbuminemia: Patient's Albumin  Trend:          Recent Labs  Lab 11/30/23 0608 12/01/23 0731 12/04/23 1633 12/07/23 1810 12/10/23 1545 12/11/23 0544 12/12/23 0521  ALBUMIN  2.3* 2.3* 2.4* 2.0* 2.7* 2.6* 2.5*  -CTM and Trend and repeat CMP in the AM   Class I Obesity: Complicates overall prognosis and care. Estimated body mass index is 32.42 kg/m as calculated from the following:   Height as of this encounter: 5' 7 (1.702 m).   Weight as of this encounter: 93.9 kg. Weight Loss and Dietary Counseling given   Was told to stop Lasix   Iron sucrose Emla  cream   At home now  Has had some ups and downs in blood sugar  Eating all the time   PT is coming to home   Has dialysis this afternoon - at home  Family member draws her labs   Unable to walk  Unable to do transfers  Using a hoyer lift    Lab Results  Component Value Date   NA 136 12/12/2023   K 3.7 12/12/2023   CO2 29 12/12/2023   GLUCOSE 90 12/12/2023   BUN 23 12/12/2023   CREATININE 4.16 (H) 12/12/2023   CALCIUM  8.4 (L) 12/12/2023   GFR 10.95 (LL) 11/19/2021   GFRNONAA 11 (L) 12/12/2023   Lab Results  Component Value Date   ALT 9 12/12/2023   AST 14 (L) 12/12/2023   ALKPHOS 37 (L) 12/12/2023   BILITOT 0.4 12/12/2023   Lab Results  Component Value Date   WBC 6.9 12/12/2023   HGB 7.5 (L) 12/12/2023   HCT 23.7 (L) 12/12/2023   MCV 97.5 12/12/2023    PLT 341 12/12/2023   Lab Results  Component Value Date   HGBA1C 5.7 (H) 11/29/2023   Lab Results  Component Value Date   TSH 3.318 12/12/2023   Lab Results  Component Value Date   CHOL 157 11/29/2023   HDL 67 11/29/2023   LDLCALC 76 11/29/2023   LDLDIRECT 154.2 11/04/2011   TRIG 68 11/29/2023   CHOLHDL 2.3 11/29/2023    Has neuro appointment tomorrow  Nephrology -end of this month     ROS: Per HPI Review of Systems  Constitutional:  Negative for chills and fever.  Respiratory:  Negative for cough, sputum production, shortness of breath and wheezing.   Cardiovascular:  Negative for chest pain and palpitations.  Gastrointestinal:  Negative for nausea and vomiting.  Genitourinary:  Negative for dysuria and hematuria.  Skin:  Negative for rash.  Neurological:  Positive for focal weakness. Negative for seizures.       No recent LOC  No further encephalopathy symptoms since coming home   Psychiatric/Behavioral:         Mood/depression seems to be well controlled      Current Outpatient Medications:    apixaban  (ELIQUIS ) 5 MG TABS tablet, Take 1 tablet (5 mg total) by mouth 2 (two) times daily. Take 5 mg by mouth twice daily, Disp: 60 tablet, Rfl: 0   aspirin  81 MG chewable tablet, Chew 1 tablet (81 mg total) by mouth daily., Disp: 30 tablet, Rfl: 2   atorvastatin  (LIPITOR ) 80 MG tablet, TAKE 1 TABLET BY MOUTH ONCE DAILY AT  6  IN  THE  EVENING (Patient taking differently: Take 80 mg by mouth every evening.), Disp: 90 tablet, Rfl: 3   bisacodyl  (DULCOLAX) 10 MG suppository, Place 10 mg rectally daily as needed for mild constipation or moderate constipation., Disp: , Rfl:    buPROPion  (WELLBUTRIN  SR) 150 MG 12 hr tablet, Take 1 tablet (150 mg total) by mouth 2 (two) times daily., Disp: 180 tablet, Rfl: 3   carvedilol  (COREG ) 3.125 MG tablet, Take 4 tablets (12.5 mg total) by mouth daily. On non-dialysis days, Disp: 60 tablet, Rfl: 0   DULoxetine  (CYMBALTA ) 60 MG capsule,  Take 1 capsule (60 mg total) by mouth daily., Disp: 90 capsule, Rfl: 3   ezetimibe  (ZETIA ) 10 MG tablet, Take 1 tablet (10 mg total) by mouth daily. (Patient taking differently: Take 10 mg by mouth every evening.), Disp: , Rfl:    ferrous sulfate  325 (65 FE) MG tablet, Take 325 mg by mouth daily as needed (low iron at dialysis)., Disp: , Rfl:    insulin  aspart (NOVOLOG ) 100 UNIT/ML FlexPen, Before each meal 3 times a day, 140-199 - 2 units, 200-250 - 4 units, 251-299 - 6 units,  300-349 - 8 units,  350 or above 10 units. (Patient taking differently: Inject 0-10 Units into the skin in the morning, at noon, and at bedtime. BS 0-139 inject 0 units  BS 149-199 inject 2 units  200-250 inject 4 units  251-299 inject 6 units  300-349 inject 8 units  350-999 inject 10 units and call MD), Disp: , Rfl:    insulin  lispro protamine-lispro (HUMALOG  75/25 MIX) (75-25) 100 UNIT/ML SUSP injection, Inject 0.06 mLs (6 Units total) into the skin 2 (two) times daily with a meal. Inject 25 units with breakfast, 10 units with lunch, 25 units with supper (Patient taking differently: Inject 10-25 Units into the skin See admin instructions.  Inject 25 units in the morning and 25 units at bedtime. Inject 10 units in the afternoon), Disp: , Rfl:    ipratropium-albuterol  (DUONEB) 0.5-2.5 (3) MG/3ML SOLN, Take 3 mLs by nebulization once., Disp: , Rfl:    levothyroxine  (SYNTHROID ) 175 MCG tablet, Take 175 mcg by mouth daily before breakfast., Disp: , Rfl:    memantine  (NAMENDA ) 10 MG tablet, Take 1 tablet by mouth twice daily, Disp: 60 tablet, Rfl: 0   Methoxy PEG-Epoetin Beta (MIRCERA) 200 MCG/0.3ML SOSY, 200 mcg by Implant route daily as needed (low RBC at dialysis). Mircera, Disp: , Rfl:    midodrine  (PROAMATINE ) 10 MG tablet, Take 1 tablet (10 mg total) by mouth 3 (three) times daily with meals., Disp: 90 tablet, Rfl: 0   nitroGLYCERIN  (NITROSTAT ) 0.4 MG SL tablet,  Place 0.4 mg under the tongue every 5 (five) minutes x 3 doses as  needed for chest pain., Disp: , Rfl:    Nutritional Supplements (FEEDING SUPPLEMENT, NEPRO CARB STEADY,) LIQD, Take 237 mLs by mouth as needed (missed meal during dialysis.). (Patient taking differently: Take 237 mLs by mouth daily as needed (for missed meals).), Disp: 1000 mL, Rfl: 0   OVER THE COUNTER MEDICATION, Apply 1 application  topically See admin instructions. Zinc barrier cream- apply to buttocks with each incontinence episode., Disp: , Rfl:    polyethylene glycol powder (GLYCOLAX /MIRALAX ) 17 GM/SCOOP powder, Take 17 g by mouth daily as needed for moderate constipation., Disp: 238 g, Rfl: 0   sevelamer  carbonate (RENVELA ) 0.8 g PACK packet, Take 0.8 g by mouth 3 (three) times daily with meals., Disp: , Rfl:    Sodium Phosphates (ENEMA) ENEM, Place 1 enema rectally daily as needed (no relief from bisacodyl  suppository)., Disp: , Rfl:    tobramycin  (TOBREX ) 0.3 % ophthalmic solution, Place 1 drop into the right eye every 2 (two) hours., Disp: , Rfl:   Observations/Objective: Today's Vitals   12/26/23 1143  BP: 126/62  Pulse: 66  Temp: 97.6 F (36.4 C)  TempSrc: Oral   Physical Exam  Patient appears well, in no distress Weight is baseline  Baseline facial droop and dysarthria noted   No obvious tremor  Mobility impaired-in chair  Some hearing impairment  No cough or shortness of breath during interview  Baseline cognition - slowed responses  No skin changes on face or neck , no rash or pallor Affect is normal    Assessment and Plan: Acute metabolic encephalopathy Assessment & Plan: From uti/sepsis  Recently hospitalized with good recovery /now resolved  Reviewed hospital records, lab results and studies in detail    Finished iv cefepime  for uti  Blood pressure is supported On home dialysis     Recurrent strokes (HCC) Assessment & Plan: No new stroke symptoms since last hospitalization  Has neuro appointment tomorrow    Essential hypertension Assessment &  Plan: Currently taking midodrine  for blood pressure support after several hospitalizations for sepsis  Monitored by family member that is a Engineer, civil (consulting)   Carvedilol  3.125 mg bid for CHF continues    No new stroke symptoms/has neuro appointment tomorrow     ESRD on hemodialysis [facility]) Assessment & Plan: Continues home dialysis  Family member nurse does her blood draws   Had labs last week - that go to nephrology   Anemia of chronic disease Assessment & Plan: In setting of renal failure on dialysis  Hb 7.5   Had re draw at home last week / I do not have access to that lab (to nephrology)    History of CVA (cerebrovascular accident)  Moderate mixed hyperlipidemia not requiring statin therapy Assessment & Plan: Disc goals for lipids and reasons to control them Rev last labs with pt Rev low sat fat diet in detail LDL of 76  Continues atorvastatin  80 mg every evening  Zetia  10 mg daily   Chronic obstructive pulmonary disease, unspecified COPD type (HCC) Assessment & Plan: No breathing problems since her LLL pna  Has duo neb for prn use    Acquired hypothyroidism Assessment & Plan: Sees endocrinology  Lab Results  Component Value Date   TSH 3.318 12/12/2023   Levothyroxine  175 mcg daily   Controlled type 2 diabetes mellitus with hypoglycemia, with long-term current use of insulin  [facility]) Assessment & Plan: Sees endocrinology  Lab Results  Component  Value Date   HGBA1C 5.7 (H) 11/29/2023   HGBA1C 6.3 (H) 11/20/2023   HGBA1C 6.2 (H) 05/20/2023   Had low glucose reading earlier today-encouraged to reach out to endo  Eating regularly and appetite is good    Adjustment disorder with mixed anxiety and depressed mood Assessment & Plan: Per pt stable with wellbutrin  and cymbalta   Pt states no c/o with mood  Also in setting of dementia   Buproprion 150 sr bid Duloxetine  60 mg daily   Memantine  10 mg bid -under neuro care   [facility]) Assessment & Plan: With  uti and prior to that LLL pna Reviewed hospital records, lab results and studies in detail  Is home with home health   Doing better    Palliative care status Assessment & Plan: I feel pt is a good candidate for this given complex medical problems (living at home with family support)   Per chart-looks like this was ordered for [facility]  Asked family to check with next [facility] visit to make sure they are getting this and if not I can re order  Want to define goals of care and preference quality of life whenever possible    Chronic diastolic CHF (congestive heart failure) (HCC) Assessment & Plan: In dialysis to help fluid balance On carvedilol   Under cardiology care    Other orders -     buPROPion  HCl ER (SR); Take 1 tablet (150 mg total) by mouth 2 (two) times daily.  Dispense: 180 tablet; Refill: 3 -     DULoxetine  HCl; Take 1 capsule (60 mg total) by mouth daily.  Dispense: 90 capsule; Refill: 3    Follow Up Instructions: No follow-ups on file.  I refilled your medications for mood Glad you are doing better  Reach out to endocrinology for the blood sugar issues   Talk to your home health team re: palliative care status  I'm happy to re refer to palliative care if needed   Follow up with neurology as planned as well as nephrology   Let us  know if you need anything else     I discussed the assessment and treatment plan with the patient. The patient was provided an opportunity to ask questions, and all were answered. The patient agreed with the plan and demonstrated an understanding of the instructions.   The patient was advised to call back or seek an in-person evaluation if the symptoms worsen or if the condition fails to improve as anticipated.  The above assessment and management plan was discussed with the patient. The patient verbalized understanding of and has agreed to the management plan.   Laine Balls, MD

## 2023-12-26 NOTE — Assessment & Plan Note (Signed)
 From uti/sepsis  Recently hospitalized with good recovery /now resolved  Reviewed hospital records, lab results and studies in detail    Finished iv cefepime  for uti  Blood pressure is supported On home dialysis

## 2023-12-26 NOTE — Assessment & Plan Note (Signed)
 No new stroke symptoms since last hospitalization  Has neuro appointment tomorrow

## 2023-12-26 NOTE — Assessment & Plan Note (Signed)
 In setting of renal failure on dialysis  Hb 7.5   Had re draw at home last week / I do not have access to that lab (to nephrology)

## 2023-12-26 NOTE — Assessment & Plan Note (Addendum)
 Currently taking midodrine  for blood pressure support after several hospitalizations for sepsis  Monitored by family member that is a nurse   Carvedilol  3.125 mg bid for CHF continues    No new stroke symptoms/has neuro appointment tomorrow

## 2023-12-26 NOTE — Assessment & Plan Note (Signed)
 Sees endocrinology  Lab Results  Component Value Date   TSH 3.318 12/12/2023   Levothyroxine  175 mcg daily

## 2023-12-26 NOTE — Assessment & Plan Note (Signed)
 Sees endocrinology  Lab Results  Component Value Date   HGBA1C 5.7 (H) 11/29/2023   HGBA1C 6.3 (H) 11/20/2023   HGBA1C 6.2 (H) 05/20/2023   Had low glucose reading earlier today-encouraged to reach out to endo  Eating regularly and appetite is good

## 2023-12-26 NOTE — Assessment & Plan Note (Signed)
 Per pt stable with wellbutrin  and cymbalta   Pt states no c/o with mood  Also in setting of dementia   Buproprion 150 sr bid Duloxetine  60 mg daily   Memantine  10 mg bid -under neuro care

## 2023-12-26 NOTE — Assessment & Plan Note (Signed)
 No breathing problems since her LLL pna  Has duo neb for prn use

## 2023-12-26 NOTE — Assessment & Plan Note (Addendum)
 Disc goals for lipids and reasons to control them Rev last labs with pt Rev low sat fat diet in detail LDL of 76  Continues atorvastatin  80 mg every evening  Zetia  10 mg daily

## 2023-12-26 NOTE — Progress Notes (Unsigned)
 [facility] [address] [place]. [place] [address] [phone]       OFFICE FOLLOW-UP VISIT NOTE  Jennifer [name] Date of Birth:  1955/12/23 Medical Record Number:  [id number]   Referring MD:  Laine Bart  Reason for Referral: Hallucinations and memory loss  HPI:  Update 12/27/2023 JM: Patient returns for follow-up visit via MyChart video visit per daughter request.  She unfortunately has had multiple hospitalizations since prior visit.  Hospitalized 6/9-6/14 for acute onset left-sided weakness and speech difficulty, MRI negative for acute stroke, EEG no evidence of seizures, felt symptoms related to UTI and placed on antibiotics.  She was discharged to SNF.  Returned shortly after and rehospitalized 6/16-6/23 for altered mental status and unresponsive with hypoglycemia and hypoxia, found to have PE and placed on Eliquis  and treated for pneumonia.  MRI showed possible punctate acute lacunar infarct, felt more incidental finding, added Zetia  in addition to atorvastatin  for LDL 76, A1c 6.3.  She was discharged back to SNF.  Returned again 6/26-7/1 for hypoxia and hypoglycemia, noted to be hypothermic and hypotensive requiring pressors possibly in setting of septic shock with possible UTI and retreated with antibiotics.  Mental status gradually returned to baseline, therapies recommended return to SNF rehab but family declined and elected for home health therapy   Seen by PCP yesterday who recommended palliative care       Update 06/22/2023 Dr. Rosemarie: She returns for follow-up after last visit 6 months ago.  She is accompanied by her daughter.  Patient was admitted to the hospital from 05/20/2023 to 05/24/2023 with a stroke.  She initially presented to [facility] with sudden onset of aphasia and right-sided weakness.  She was given IV TNK by telemetry neurologist and patient was transferred to [facility] where upon arrival she had significant worsening of her  exam and stat head CT was performed which showed hemorrhagic transformation in the right pontine infarct.  TNK was reversed with TXA and cryoprecipitate.  She also had a small hematoma in the right upper arm Allises fistula but this was conservatively treated.  She was monitored closely in the ICU and blood pressure was tightly controlled.  She remained stable.  MRI scan showed punctate posterior left MCA subcortical white matter infarct acute right dorsal brainstem hemorrhage which was stable.  She with changes of chronic small vessel disease and extensive chronic microhemorrhages brain.  2D echo showed ejection fraction of 60 to 65%.  Lower extremity venous Dopplers were negative for DVT.  LDL cholesterol 102 mg percent and hemoglobin A1c was 6.2.  Patient was discharged home with home health and his family prefer that over inpatient rehab.  Patient still getting home health physical occupational therapy.  She can walk a little bit with 1 person assist but not for long distances.  The daughter states that the physical deficits seems to be improving but cognitively she has worsened since her Namenda  dose was reduced from 10 mg twice daily to 5 mg twice daily when she left the hospital this time.  She is having hallucinations and seeing things people were not there.  She is living at home with her husband and 1 daughter.  Mini-Mental status exam today she scored 19/30.   Initial visit 11/22/2022 Dr. Rosemarie: Jennifer [name] is a 68 year old Caucasian lady seen today for office consultation visit hallucinations and memory loss.  She is accompanied by her daughter.  History is obtained from them and review of electronic medical records.  I personally reviewed pertinent available imaging films in PACS.  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  Patient was last seen in the office for stroke follow-up by Harlene nurse practitioner on 02/12/2019 then lost to follow-up.  Patient has had some cognitive deterioration for the last 6 to 7 months.  Family has noticed that she often talks to the parents.  During screening things like getting up at 3 in the morning and trying to cook breakfast and she has not cooked for years.  Confused and gets disoriented.  She often forgets the name of this.  With her husband and daughter.  She has not exhibited any behavior agitation.  Needs help with activities of daily living like taking a bath and changing her clothes.  She is never left alone more than a few hours at a time.  She walks with a walker for the last 8 months mostly because her left leg is swollen and heavy.  She has had a few falls.  He has not had any recent obvious stroke like symptoms.   She has end-stage renal disease and recently underwent AV fistula placement in anticipation for dialysis in the future.  She has not had any brain imaging studies.  Her MMSE: 28/30 PCPs office in February 2024 has declined today to 21/30.  CT head on 01/23/2022 had shown chronic white matter changes only.      ROS:   14 system review of systems is positive for hallucinations, disorientation, confusion, memory loss, poor balance, falls, bruising and all other systems negative  PMH:  Past Medical History:  Diagnosis Date   CAD (coronary artery disease)    cath 5/23 100% dist RCA lesion treated with 2 overlapping Integrity Resolute DES ( 2.25x56mm, 2.25x32mm), 60% mid RCA treated medically, 99% OM2 not amenable to PCI, 70% D1 lesion, EF normal   Hypercholesteremia    Hypertension    Hypothyroidism    Kidney disease    Renal disorder    Stroke (HCC) 10/2018   Thyroid  disease    Type 2 diabetes mellitus (HCC) 10/08/2018    Social History:  Social History   Socioeconomic History   Marital status: Married    Spouse  name: Not on file   Number of children: Not on file   Years of education: Not on file   Highest education level: Not on file  Occupational History   Not on file  Tobacco Use   Smoking status: Former    Current packs/day: 0.00    Types: Cigarettes    Quit date: 05/13/2021    Years since quitting: 2.6   Smokeless tobacco: Never  Vaping Use   Vaping status: Never Used  Substance and Sexual Activity   Alcohol  use: No    Alcohol /week: 0.0 standard drinks of alcohol    Drug use: No   Sexual activity: Not on file  Other Topics Concern   Not on file  Social History Narrative   Not on file   Social Drivers of Health   Financial Resource Strain: Low Risk  (03/07/2023)   Overall Financial Resource Strain (CARDIA)    Difficulty of Paying Living Expenses: Not hard at all  Food Insecurity: No Food Insecurity (11/21/2023)   Hunger Vital Sign    Worried About Running Out of Food in the Last Year:  Never true    Ran Out of Food in the Last Year: Never true  Transportation Needs: No Transportation Needs (11/21/2023)   PRAPARE - Administrator, Civil Service (Medical): No    Lack of Transportation (Non-Medical): No  Physical Activity: Inactive (03/07/2023)   Exercise Vital Sign    Days of Exercise per Week: 0 days    Minutes of Exercise per Session: 0 min  Stress: No Stress Concern Present (03/07/2023)   [employer] of Occupational Health - Occupational Stress Questionnaire    Feeling of Stress : Not at all  Social Connections: Socially Integrated (11/21/2023)   Social Connection and Isolation Panel    Frequency of Communication with Friends and Family: More than three times a week    Frequency of Social Gatherings with Friends and Family: More than three times a week    Attends Religious Services: More than 4 times per year    Active Member of [employer] or Organizations: Yes    Attends Engineer, structural: More than 4 times per year    Marital Status: Married   Catering manager Violence: Not At Risk (11/21/2023)   Humiliation, Afraid, Rape, and Kick questionnaire    Fear of Current or Ex-Partner: No    Emotionally Abused: No    Physically Abused: No    Sexually Abused: No    Medications:   Current Outpatient Medications on File Prior to Visit  Medication Sig Dispense Refill   apixaban  (ELIQUIS ) 5 MG TABS tablet Take 1 tablet (5 mg total) by mouth 2 (two) times daily. Take 5 mg by mouth twice daily 60 tablet 0   aspirin  81 MG chewable tablet Chew 1 tablet (81 mg total) by mouth daily. 30 tablet 2   atorvastatin  (LIPITOR ) 80 MG tablet TAKE 1 TABLET BY MOUTH ONCE DAILY AT  6  IN  THE  EVENING (Patient taking differently: Take 80 mg by mouth every evening.) 90 tablet 3   bisacodyl  (DULCOLAX) 10 MG suppository Place 10 mg rectally daily as needed for mild constipation or moderate constipation.     buPROPion  (WELLBUTRIN  SR) 150 MG 12 hr tablet Take 1 tablet (150 mg total) by mouth 2 (two) times daily. 180 tablet 3   carvedilol  (COREG ) 3.125 MG tablet Take 4 tablets (12.5 mg total) by mouth daily. On non-dialysis days 60 tablet 0   DULoxetine  (CYMBALTA ) 60 MG capsule Take 1 capsule (60 mg total) by mouth daily. 90 capsule 3   ezetimibe  (ZETIA ) 10 MG tablet Take 1 tablet (10 mg total) by mouth daily. (Patient taking differently: Take 10 mg by mouth every evening.)     ferrous sulfate  325 (65 FE) MG tablet Take 325 mg by mouth daily as needed (low iron at dialysis).     insulin  aspart (NOVOLOG ) 100 UNIT/ML FlexPen Before each meal 3 times a day, 140-199 - 2 units, 200-250 - 4 units, 251-299 - 6 units,  300-349 - 8 units,  350 or above 10 units. (Patient taking differently: Inject 0-10 Units into the skin in the morning, at noon, and at bedtime. BS 0-139 inject 0 units  BS 149-199 inject 2 units  200-250 inject 4 units  251-299 inject 6 units  300-349 inject 8 units  350-999 inject 10 units and call MD)     insulin  lispro protamine-lispro (HUMALOG  75/25  MIX) (75-25) 100 UNIT/ML SUSP injection Inject 0.06 mLs (6 Units total) into the skin 2 (two) times daily with a meal. Inject 25 units  with breakfast, 10 units with lunch, 25 units with supper (Patient taking differently: Inject 10-25 Units into the skin See admin instructions.  Inject 25 units in the morning and 25 units at bedtime. Inject 10 units in the afternoon)     ipratropium-albuterol  (DUONEB) 0.5-2.5 (3) MG/3ML SOLN Take 3 mLs by nebulization once.     levothyroxine  (SYNTHROID ) 175 MCG tablet Take 175 mcg by mouth daily before breakfast.     memantine  (NAMENDA ) 10 MG tablet Take 1 tablet by mouth twice daily 60 tablet 0   Methoxy PEG-Epoetin Beta (MIRCERA) 200 MCG/0.3ML SOSY 200 mcg by Implant route daily as needed (low RBC at dialysis). Mircera     midodrine  (PROAMATINE ) 10 MG tablet Take 1 tablet (10 mg total) by mouth 3 (three) times daily with meals. 90 tablet 0   nitroGLYCERIN  (NITROSTAT ) 0.4 MG SL tablet Place 0.4 mg under the tongue every 5 (five) minutes x 3 doses as needed for chest pain.     Nutritional Supplements (FEEDING SUPPLEMENT, NEPRO CARB STEADY,) LIQD Take 237 mLs by mouth as needed (missed meal during dialysis.). (Patient taking differently: Take 237 mLs by mouth daily as needed (for missed meals).) 1000 mL 0   OVER THE COUNTER MEDICATION Apply 1 application  topically See admin instructions. Zinc barrier cream- apply to buttocks with each incontinence episode.     polyethylene glycol powder (GLYCOLAX /MIRALAX ) 17 GM/SCOOP powder Take 17 g by mouth daily as needed for moderate constipation. 238 g 0   sevelamer  carbonate (RENVELA ) 0.8 g PACK packet Take 0.8 g by mouth 3 (three) times daily with meals.     Sodium Phosphates (ENEMA) ENEM Place 1 enema rectally daily as needed (no relief from bisacodyl  suppository).     tobramycin  (TOBREX ) 0.3 % ophthalmic solution Place 1 drop into the right eye every 2 (two) hours.     No current facility-administered medications on file prior  to visit.    Allergies:   Allergies  Allergen Reactions   Glucophage [Metformin] Diarrhea   Tnkase  [Tenecteplase ] Other (See Comments)    Unknown reaction    Physical Exam General: Mildly obese pleasant middle-aged Caucasian lady seated, in no evident distress Head: head normocephalic and atraumatic.   Neck: supple with no carotid or supraclavicular bruits Cardiovascular: regular rate and rhythm, no murmurs Musculoskeletal: no deformity Skin:  no rash/petichiae Vascular:  Normal pulses all extremities  Neurologic Exam Mental Status: Awake and fully alert. Oriented to place and time. Recent and remote memory are poor. Attention span, concentration and fund of knowledge diminished. Mood and affect appropriate.  Poor recall 0/3.  Able to name only 11 animals which can walk on 4 legs.  Clock drawing 2/4.  Mental status exam scored 19/30. Cranial Nerves: Fundoscopic exam not done. Pupils equal, briskly reactive to light. Extraocular movements full without nystagmus. Visual fields full to confrontation. Hearing intact. Facial sensation intact.  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  Tongue, palate moves normally and symmetrically.  Motor: Mild spastic left hemiparesis 4/5 strength with weakness of left grip and intrinsic hand muscles and left hip flexors and ankle dorsiflexors.  Tone is increased on the left.  Normal strength on the right. Sensory.:  Diminished left hemibody sensation to touch , pinprick , position and vibratory sensation.  Coordination: Rapid alternating movements normal in all extremities. Finger-to-nose and heel-to-shin performed accurately bilaterally. Gait and Station: Deferred as patient is unable to walk Reflexes: 1+ and symmetric. Toes downgoing.    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  Prior history of left corpus callosum lacunar infarct in 2020 and  old right basal ganglia infarct in 2019.  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     PLAN:  I had a long discussion with the patient and her daughter regarding recent pontine stroke with post TNK hemorrhage and disability left hemiparesis and mixed vascular and Alzheimer's dementia.  Recommend she increase the Namenda  dose back to 10 mg twice daily.  Continue aspirin  for stroke prevention and maintain aggressive risk factor modification with strict control of hypertension with blood pressure goal below 130/90, lipids with LDL cholesterol goal below 70 mg percent and diabetes with hemoglobin A1c goal below 6.5%.  Continue ongoing home physical occupational therapy She was encouraged to use her walker at all times discussed fall prevention precautions.  Return for follow-up in the future in 6 months with my nurse practitioner or call earlier if necessary.  Return for follow-up in the future in 4 months or call earlier if necessary.   I personally spent a total of *** minutes in the care of the patient today including {Time Based Coding:210964241}.  Harlene Bogaert, AGNP-BC  [facility] [address] [place], [place] [address]  Phone [phone] Fax [phone] Note: This document was prepared with digital dictation and possible smart phrase technology. Any transcriptional errors that result from this process are unintentional.

## 2023-12-26 NOTE — Assessment & Plan Note (Signed)
 Continues home dialysis  Family member nurse does her blood draws   Had labs last week - that go to nephrology

## 2023-12-26 NOTE — Assessment & Plan Note (Signed)
 I feel pt is a good candidate for this given complex medical problems (living at home with family support)   Per chart-looks like this was ordered for Jennifer [name]  Asked family to check with next [facility] visit to make sure they are getting this and if not I can re order  Want to define goals of care and preference quality of life whenever possible

## 2023-12-26 NOTE — Assessment & Plan Note (Signed)
 In dialysis to help fluid balance On carvedilol   Under cardiology care

## 2023-12-26 NOTE — Patient Instructions (Signed)
 I refilled your medications for mood Glad you are doing better  Reach out to endocrinology for the blood sugar issues   Talk to your home health team re: palliative care status  I'm happy to re refer to palliative care if needed   Follow up with neurology as planned as well as nephrology   Let us  know if you need anything else

## 2023-12-27 ENCOUNTER — Encounter: Payer: Self-pay | Admitting: Adult Health

## 2023-12-27 ENCOUNTER — Telehealth (INDEPENDENT_AMBULATORY_CARE_PROVIDER_SITE_OTHER): Payer: Medicare Other | Admitting: Adult Health

## 2023-12-27 VITALS — BP 152/63

## 2023-12-27 DIAGNOSIS — F0150 Vascular dementia without behavioral disturbance: Secondary | ICD-10-CM | POA: Diagnosis not present

## 2023-12-27 DIAGNOSIS — Z8673 Personal history of transient ischemic attack (TIA), and cerebral infarction without residual deficits: Secondary | ICD-10-CM | POA: Diagnosis not present

## 2023-12-27 MED ORDER — MEMANTINE HCL 10 MG PO TABS: 10.0000 mg | ORAL_TABLET | Freq: Two times a day (BID) | ORAL | 11 refills | Status: AC

## 2024-01-03 MED ORDER — MIDODRINE HCL 10 MG PO TABS
10.0000 mg | ORAL_TABLET | Freq: Three times a day (TID) | ORAL | 5 refills | Status: DC
  Filled 2024-01-03: qty 90, 30d supply, fill #0

## 2024-01-03 MED ORDER — APIXABAN 5 MG PO TABS
5.0000 mg | ORAL_TABLET | Freq: Two times a day (BID) | ORAL | 5 refills | Status: DC
  Filled 2024-01-03: qty 60, 30d supply, fill #0

## 2024-01-04 ENCOUNTER — Telehealth: Payer: Self-pay | Admitting: Family Medicine

## 2024-01-04 ENCOUNTER — Other Ambulatory Visit (HOSPITAL_COMMUNITY): Payer: Self-pay

## 2024-01-04 NOTE — Telephone Encounter (Signed)
 Left message to return call to our office at daughters number.

## 2024-01-04 NOTE — Telephone Encounter (Signed)
 I think I sent her medicines to the [facility] pharmacy by mistake Please ask her caregiver where to send  Thanks !

## 2024-01-08 NOTE — Telephone Encounter (Signed)
 Unable to reach patients daughter. Left voicemail to return call to our office.

## 2024-01-11 MED ORDER — APIXABAN 5 MG PO TABS: 5.0000 mg | ORAL_TABLET | Freq: Two times a day (BID) | ORAL | 1 refills | Status: AC

## 2024-01-11 MED ORDER — MIDODRINE HCL 10 MG PO TABS: 10.0000 mg | ORAL_TABLET | Freq: Three times a day (TID) | ORAL | 1 refills | Status: DC

## 2024-01-11 NOTE — Telephone Encounter (Signed)
Sent mychart regarding this

## 2024-01-11 NOTE — Addendum Note (Signed)
 Addended by: RAYLEEN GLENDORA HERO on: 01/11/2024 04:47 PM   Modules accepted: Orders

## 2024-01-22 ENCOUNTER — Encounter: Payer: Self-pay | Admitting: Adult Health

## 2024-01-23 ENCOUNTER — Encounter: Payer: Self-pay | Admitting: Family Medicine

## 2024-01-24 ENCOUNTER — Encounter (HOSPITAL_COMMUNITY): Payer: Self-pay | Admitting: Emergency Medicine

## 2024-01-24 ENCOUNTER — Emergency Department (HOSPITAL_COMMUNITY)
Admission: EM | Admit: 2024-01-24 | Discharge: 2024-01-24 | Disposition: A | Discharge status: Home / Self Care | Attending: Emergency Medicine | Admitting: Emergency Medicine

## 2024-01-24 ENCOUNTER — Other Ambulatory Visit: Payer: Self-pay

## 2024-01-24 ENCOUNTER — Emergency Department (HOSPITAL_COMMUNITY)

## 2024-01-24 ENCOUNTER — Telehealth: Payer: Self-pay | Admitting: *Deleted

## 2024-01-24 DIAGNOSIS — Z7982 Long term (current) use of aspirin: Secondary | ICD-10-CM | POA: Diagnosis not present

## 2024-01-24 DIAGNOSIS — I447 Left bundle-branch block, unspecified: Secondary | ICD-10-CM | POA: Diagnosis not present

## 2024-01-24 DIAGNOSIS — R451 Restlessness and agitation: Secondary | ICD-10-CM | POA: Diagnosis not present

## 2024-01-24 DIAGNOSIS — Z79899 Other long term (current) drug therapy: Secondary | ICD-10-CM | POA: Insufficient documentation

## 2024-01-24 DIAGNOSIS — N3000 Acute cystitis without hematuria: Secondary | ICD-10-CM | POA: Diagnosis not present

## 2024-01-24 DIAGNOSIS — R443 Hallucinations, unspecified: Secondary | ICD-10-CM | POA: Insufficient documentation

## 2024-01-24 DIAGNOSIS — Z8673 Personal history of transient ischemic attack (TIA), and cerebral infarction without residual deficits: Secondary | ICD-10-CM | POA: Insufficient documentation

## 2024-01-24 DIAGNOSIS — N186 End stage renal disease: Secondary | ICD-10-CM | POA: Insufficient documentation

## 2024-01-24 DIAGNOSIS — F0390 Unspecified dementia without behavioral disturbance: Secondary | ICD-10-CM | POA: Insufficient documentation

## 2024-01-24 DIAGNOSIS — R4182 Altered mental status, unspecified: Secondary | ICD-10-CM | POA: Diagnosis present

## 2024-01-24 DIAGNOSIS — Z794 Long term (current) use of insulin: Secondary | ICD-10-CM | POA: Diagnosis not present

## 2024-01-24 DIAGNOSIS — Z7901 Long term (current) use of anticoagulants: Secondary | ICD-10-CM | POA: Diagnosis not present

## 2024-01-24 DIAGNOSIS — E1122 Type 2 diabetes mellitus with diabetic chronic kidney disease: Secondary | ICD-10-CM | POA: Diagnosis not present

## 2024-01-24 DIAGNOSIS — E876 Hypokalemia: Secondary | ICD-10-CM

## 2024-01-24 DIAGNOSIS — Z992 Dependence on renal dialysis: Secondary | ICD-10-CM | POA: Diagnosis not present

## 2024-01-24 LAB — COMPREHENSIVE METABOLIC PANEL WITH GFR
ALT: 13 U/L (ref 0–44)
AST: 14 U/L — ABNORMAL LOW (ref 15–41)
Albumin: 3.6 g/dL (ref 3.5–5.0)
Alkaline Phosphatase: 59 U/L (ref 38–126)
Anion gap: 16 — ABNORMAL HIGH (ref 5–15)
BUN: 23 mg/dL (ref 8–23)
CO2: 28 mmol/L (ref 22–32)
Calcium: 9.2 mg/dL (ref 8.9–10.3)
Chloride: 91 mmol/L — ABNORMAL LOW (ref 98–111)
Creatinine, Ser: 3.98 mg/dL — ABNORMAL HIGH (ref 0.44–1.00)
GFR, Estimated: 12 mL/min — ABNORMAL LOW (ref >60)
Glucose, Bld: 150 mg/dL — ABNORMAL HIGH (ref 70–99)
Potassium: 2.8 mmol/L — ABNORMAL LOW (ref 3.5–5.1)
Sodium: 135 mmol/L (ref 135–145)
Total Bilirubin: 1 mg/dL (ref 0.0–1.2)
Total Protein: 6.3 g/dL — ABNORMAL LOW (ref 6.5–8.1)

## 2024-01-24 LAB — URINALYSIS, W/ REFLEX TO CULTURE (INFECTION SUSPECTED)
Bilirubin Urine: NEGATIVE
Glucose, UA: NEGATIVE mg/dL
Hgb urine dipstick: NEGATIVE
Ketones, ur: 5 mg/dL — AB
Nitrite: NEGATIVE
Protein, ur: 100 mg/dL — AB
Specific Gravity, Urine: 1.013 (ref 1.005–1.030)
WBC, UA: >50 WBC/hpf (ref 0–5)
pH: 7 (ref 5.0–8.0)

## 2024-01-24 LAB — CBC WITH DIFFERENTIAL/PLATELET
Abs Immature Granulocytes: 0.03 K/uL (ref 0.00–0.07)
Basophils Absolute: 0 K/uL (ref 0.0–0.1)
Basophils Relative: 1 %
Eosinophils Absolute: 0.1 K/uL (ref 0.0–0.5)
Eosinophils Relative: 2 %
HCT: 24.3 % — ABNORMAL LOW (ref 36.0–46.0)
Hemoglobin: 7.8 g/dL — ABNORMAL LOW (ref 12.0–15.0)
Immature Granulocytes: 0 %
Lymphocytes Relative: 20 %
Lymphs Abs: 1.4 K/uL (ref 0.7–4.0)
MCH: 30.7 pg (ref 26.0–34.0)
MCHC: 32.1 g/dL (ref 30.0–36.0)
MCV: 95.7 fL (ref 80.0–100.0)
Monocytes Absolute: 0.5 K/uL (ref 0.1–1.0)
Monocytes Relative: 8 %
Neutro Abs: 4.8 K/uL (ref 1.7–7.7)
Neutrophils Relative %: 69 %
Platelets: 296 K/uL (ref 150–400)
RBC: 2.54 MIL/uL — ABNORMAL LOW (ref 3.87–5.11)
RDW: 14.4 % (ref 11.5–15.5)
WBC: 6.9 K/uL (ref 4.0–10.5)
nRBC: 0 % (ref 0.0–0.2)

## 2024-01-24 LAB — LACTIC ACID, PLASMA: Lactic Acid, Venous: 1.3 mmol/L (ref 0.5–1.9)

## 2024-01-24 LAB — PROTIME-INR
INR: 1.4 — ABNORMAL HIGH (ref 0.8–1.2)
Prothrombin Time: 18.4 s — ABNORMAL HIGH (ref 11.4–15.2)

## 2024-01-24 LAB — BLOOD GAS, ARTERIAL
Acid-Base Excess: 8.6 mmol/L — ABNORMAL HIGH (ref 0.0–2.0)
Bicarbonate: 34.1 mmol/L — ABNORMAL HIGH (ref 20.0–28.0)
Drawn by: 38235
O2 Saturation: 95.4 %
Patient temperature: 37
pCO2 arterial: 49 mmHg — ABNORMAL HIGH (ref 32–48)
pH, Arterial: 7.45 (ref 7.35–7.45)
pO2, Arterial: 64 mmHg — ABNORMAL LOW (ref 83–108)

## 2024-01-24 LAB — RESP PANEL BY RT-PCR (RSV, FLU A&B, COVID)  RVPGX2
Influenza A by PCR: NEGATIVE
Influenza B by PCR: NEGATIVE
Resp Syncytial Virus by PCR: NEGATIVE
SARS Coronavirus 2 by RT PCR: NEGATIVE

## 2024-01-24 MED ORDER — SODIUM CHLORIDE 0.9 % IV SOLN
500.0000 mg | Freq: Once | INTRAVENOUS | Status: DC
  Administered 2024-01-24 (×2): 500 mg via INTRAVENOUS
  Filled 2024-01-24: qty 5

## 2024-01-24 MED ORDER — LACTATED RINGERS IV SOLN: INTRAVENOUS | Status: DC

## 2024-01-24 MED ORDER — CEPHALEXIN 500 MG PO CAPS: 500.0000 mg | ORAL_CAPSULE | Freq: Three times a day (TID) | ORAL | 0 refills | Status: AC

## 2024-01-24 MED ORDER — POTASSIUM CHLORIDE 10 MEQ/100ML IV SOLN
10.0000 meq | Freq: Once | INTRAVENOUS | Status: AC
  Administered 2024-01-24 (×2): 10 meq via INTRAVENOUS
  Filled 2024-01-24: qty 100

## 2024-01-24 MED ORDER — HALOPERIDOL LACTATE 5 MG/ML IJ SOLN: 5.0000 mg | Freq: Once | INTRAMUSCULAR | Status: DC

## 2024-01-24 MED ORDER — SODIUM CHLORIDE 0.9 % IV SOLN
2.0000 g | Freq: Once | INTRAVENOUS | Status: AC
  Administered 2024-01-24 (×2): 2 g via INTRAVENOUS
  Filled 2024-01-24: qty 20

## 2024-01-24 MED ORDER — MAGNESIUM SULFATE 2 GM/50ML IV SOLN
2.0000 g | INTRAVENOUS | Status: AC
  Administered 2024-01-24 (×2): 2 g via INTRAVENOUS
  Filled 2024-01-24: qty 50

## 2024-01-24 NOTE — ED Notes (Addendum)
 Candice, RN attempted IV access x 2 unsuccessful- this nurse will attempt PIV access when pt back from CT- labs not drawn yet- pt taken to CT- lab tech will come back when pt returns from CT

## 2024-01-24 NOTE — ED Notes (Signed)
 Pt Right arm restricted- lab obtain BC x 2

## 2024-01-24 NOTE — Telephone Encounter (Signed)
 Thanks for letting me know  Unsure if we have cath supplies here for in /out  Could she give a sample at home without a cath?   (Nun's hat)?   Are you getting any home health care currently ?   Thanks

## 2024-01-24 NOTE — ED Provider Notes (Signed)
 [place] EMERGENCY DEPARTMENT AT [facility] Provider Note   CSN: [id number] Arrival date & time: 01/24/24  8147     Patient presents with: Altered Mental Status   Jennifer [name] is a 68 y.o. female.    Altered Mental Status  This patient is a 68 year old female, she is on Eliquis , she is on cholesterol medications, antianxiety medications, insulin  for her diabetes as well as dialysis for end-stage renal disease, she dialyzes on Monday Wednesdays and Fridays but did not go today because of altered mental status, she arrives by paramedic transport because the family states she has not been like her self not talking, not responsive, they noted no significant abnormal vital signs and her blood sugar was just over 100.  Review of the medical record shows that the patient had a message to the family doctor yesterday because of a change in mental status, the family and that note reported that the patient has had increasing confusion agitation and hallucinations over several weeks  Admitted to the hospital approximately a month and a half ago because of sepsis and septic shock.  He had had a stroke early June she had pneumonia during the admission, admitted to the hospital for approximately 1 week.  She had a metabolic encephalopathy during that admission, they recommended skilled nursing facility but the daughter wanted to take her home instead patient was mildly confused at discharge, noted to have a history of dementia  The patient is not able to answer any questions, she does follow simple commands      Prior to Admission medications   Medication Sig Start Date End Date Taking? Authorizing Provider  apixaban  (ELIQUIS ) 5 MG TABS tablet Take 1 tablet (5 mg total) by mouth 2 (two) times daily. 01/11/24  Yes [name], Laine LABOR, MD  aspirin  81 MG chewable tablet Chew 1 tablet (81 mg total) by mouth daily. 05/24/23  Yes de Clint Kill, Cortney E, NP  atorvastatin  (LIPITOR ) 80 MG tablet TAKE  1 TABLET BY MOUTH ONCE DAILY AT  6  IN  THE  EVENING Patient taking differently: Take 80 mg by mouth every evening. 09/06/23  Yes Mallipeddi, Vishnu P, MD  bisacodyl  (DULCOLAX) 10 MG suppository Place 10 mg rectally daily as needed for mild constipation or moderate constipation.   Yes [provider]  buPROPion  (WELLBUTRIN  SR) 150 MG 12 hr tablet Take 1 tablet (150 mg total) by mouth 2 (two) times daily. 12/26/23  Yes [name], Laine LABOR, MD  carvedilol  (COREG ) 12.5 MG tablet Take 12.5 mg by mouth 3 (three) times a week. Monday, Thursday, and Friday   Yes [provider]  cephALEXin  (KEFLEX ) 500 MG capsule Take 1 capsule (500 mg total) by mouth 3 (three) times daily for 7 days. 01/24/24 01/31/24 Yes Cleotilde Rogue, MD  DULoxetine  (CYMBALTA ) 60 MG capsule Take 1 capsule (60 mg total) by mouth daily. 12/26/23  Yes [name], Laine LABOR, MD  insulin  lispro protamine-lispro (HUMALOG  75/25 MIX) (75-25) 100 UNIT/ML SUSP injection Inject 0.06 mLs (6 Units total) into the skin 2 (two) times daily with a meal. Inject 25 units with breakfast, 10 units with lunch, 25 units with supper Patient taking differently: Inject 10-25 Units into the skin See admin instructions.  Inject 25 units in the morning and 25 units at bedtime. Inject 10 units in the afternoon 12/04/23  Yes Singh, Prashant K, MD  iron sucrose 100 mg in sodium chloride  0.9 % 100 mL Inject 100 mg into the vein every dialysis (  low iron).   Yes [provider]  levothyroxine  (SYNTHROID ) 175 MCG tablet Take 175 mcg by mouth daily before breakfast.   Yes [provider]  lidocaine -prilocaine  (EMLA ) cream Apply 1 Application topically daily as needed (HD). 01/10/24  Yes [provider]  Melatonin 10 MG TABS Take 1 tablet by mouth at bedtime as needed (sleep).   Yes [provider]  memantine  (NAMENDA ) 10 MG tablet Take 1 tablet (10 mg total) by mouth 2 (two) times daily. 12/27/23  Yes McCue, Harlene, NP  Methoxy PEG-Epoetin  Beta (MIRCERA) 200 MCG/0.3ML SOSY 200 mcg by Implant route daily as needed (low RBC at dialysis). Mircera   Yes [provider]  midodrine  (PROAMATINE ) 10 MG tablet Take 1 tablet (10 mg total) by mouth 3 (three) times daily with meals. 01/11/24  Yes [name], Laine LABOR, MD  nitroGLYCERIN  (NITROSTAT ) 0.4 MG SL tablet Place 0.4 mg under the tongue every 5 (five) minutes x 3 doses as needed for chest pain.   Yes [provider]  OVER THE COUNTER MEDICATION Apply 1 application  topically See admin instructions. Zinc barrier cream- apply to buttocks with each incontinence episode.   Yes [provider]  polyethylene glycol powder (GLYCOLAX /MIRALAX ) 17 GM/SCOOP powder Take 17 g by mouth daily as needed for moderate constipation. 12/12/23  Yes Sheikh, Omair Latif, DO  sevelamer  carbonate (RENVELA ) 0.8 g PACK packet Take 0.8 g by mouth 3 (three) times daily with meals.   Yes [provider]  ezetimibe  (ZETIA ) 10 MG tablet Take 1 tablet (10 mg total) by mouth daily. Patient not taking: Reported on 01/24/2024 12/04/23   Singh, Prashant K, MD    Allergies: Tnkase  [tenecteplase ] and Glucophage [metformin]    Review of Systems  All other systems reviewed and are negative.   Updated Vital Signs BP (!) 150/79   Pulse 75   Temp 98.5 F (36.9 C)   Resp 18   SpO2 94%   Physical Exam Vitals and nursing note reviewed.  Constitutional:      Appearance: She is well-developed. She is ill-appearing.  HENT:     Head: Normocephalic and atraumatic.     Mouth/Throat:     Mouth: Mucous membranes are dry.     Pharynx: No oropharyngeal exudate.  Eyes:     General: No scleral icterus.       Right eye: No discharge.        Left eye: No discharge.     Conjunctiva/sclera: Conjunctivae normal.     Pupils: Pupils are equal, round, and reactive to light.  Neck:     Thyroid : No thyromegaly.     Vascular: No JVD.  Cardiovascular:     Rate and Rhythm: Normal rate and regular rhythm.      Heart sounds: Normal heart sounds. No murmur heard.    No friction rub. No gallop.  Pulmonary:     Effort: Pulmonary effort is normal. No respiratory distress.     Breath sounds: Normal breath sounds. No wheezing or rales.  Abdominal:     General: Bowel sounds are normal. There is no distension.     Palpations: Abdomen is soft. There is no mass.     Tenderness: There is no abdominal tenderness.  Musculoskeletal:        General: No tenderness. Normal range of motion.     Cervical back: Normal range of motion and neck supple.     Right lower leg: No edema.     Left lower  leg: No edema.  Lymphadenopathy:     Cervical: No cervical adenopathy.  Skin:    General: Skin is warm and dry.     Findings: No erythema or rash.  Neurological:     Mental Status: She is alert.     Coordination: Coordination normal.     Comments: The patient is awake, she can follow very simple commands such as giving me weak grips bilaterally, she will not answer questions to voice, she will not move her legs, she will not give me a thumbs up, she does follow me with her eyes occasionally but not every time, she does respond to painful stimuli in all 4 extremities  Psychiatric:        Behavior: Behavior normal.     (all labs ordered are listed, but only abnormal results are displayed) Labs Reviewed  COMPREHENSIVE METABOLIC PANEL WITH GFR - Abnormal; Notable for the following components:      Result Value   Potassium 2.8 (*)    Chloride 91 (*)    Glucose, Bld 150 (*)    Creatinine, Ser 3.98 (*)    Total Protein 6.3 (*)    AST 14 (*)    GFR, Estimated 12 (*)    Anion gap 16 (*)    All other components within normal limits  CBC WITH DIFFERENTIAL/PLATELET - Abnormal; Notable for the following components:   RBC 2.54 (*)    Hemoglobin 7.8 (*)    HCT 24.3 (*)    All other components within normal limits  PROTIME-INR - Abnormal; Notable for the following components:   Prothrombin Time 18.4 (*)    INR 1.4 (*)     All other components within normal limits  URINALYSIS, W/ REFLEX TO CULTURE (INFECTION SUSPECTED) - Abnormal; Notable for the following components:   APPearance TURBID (*)    Ketones, ur 5 (*)    Protein, ur 100 (*)    Leukocytes,Ua MODERATE (*)    Bacteria, UA MANY (*)    All other components within normal limits  BLOOD GAS, ARTERIAL - Abnormal; Notable for the following components:   pCO2 arterial 49 (*)    pO2, Arterial 64 (*)    Bicarbonate 34.1 (*)    Acid-Base Excess 8.6 (*)    All other components within normal limits  RESP PANEL BY RT-PCR (RSV, FLU A&B, COVID)  RVPGX2  CULTURE, BLOOD (ROUTINE X 2)  CULTURE, BLOOD (ROUTINE X 2)  URINE CULTURE  LACTIC ACID, PLASMA    EKG: EKG Interpretation Date/Time:  Wednesday January 24 2024 19:23:25 EDT Ventricular Rate:  71 PR Interval:    QRS Duration:  113 QT Interval:  459 QTC Calculation: 499 R Axis:   52  Text Interpretation: Accelerated junctional rhythm Incomplete left bundle branch block Confirmed by Ruthe Cornet [phone]) on 01/25/2024 8:10:19 AM  Radiology: CT Head Wo Contrast Result Date: 01/24/2024 CLINICAL DATA:  Head trauma, abnormal mental status (Age 42-64y) altered, recent CVA EXAM: CT HEAD WITHOUT CONTRAST TECHNIQUE: Contiguous axial images were obtained from the base of the skull through the vertex without intravenous contrast. RADIATION DOSE REDUCTION: This exam was performed according to the departmental dose-optimization program which includes automated exposure control, adjustment of the mA and/or kV according to patient size and/or use of iterative reconstruction technique. COMPARISON:  Head CT 12/07/2023 FINDINGS: Brain: No acute intracranial hemorrhage. Stable degree of atrophy and chronic small vessel ischemia. Multiple remote lacunar infarcts are unchanged from prior exam. No evidence of territorial or acute ischemia.  No subdural or extra-axial collection. No midline shift or mass lesion/mass effect. No  hydrocephalus. Vascular: Atherosclerosis of skullbase vasculature without hyperdense vessel or abnormal calcification. Skull: No fracture or focal lesion. Sinuses/Orbits: No acute finding.  Bilateral lens resection. Other: None. IMPRESSION: 1. No acute intracranial abnormality. 2. Stable atrophy, chronic small vessel ischemia, and remote lacunar infarcts. Electronically Signed   By: Andrea Gasman M.D.   On: 01/24/2024 20:56   DG Chest Port 1 View Result Date: 01/24/2024 CLINICAL DATA:  Questionable sepsis - evaluate for abnormality EXAM: PORTABLE CHEST - 1 VIEW COMPARISON:  December 12, 2023 FINDINGS: No focal airspace consolidation, pleural effusion, or pneumothorax. The cardiac silhouette is at the upper limits of normal, likely accentuated by AP technique and low lung volumes. Tortuous aorta with aortic atherosclerosis. No acute fracture or destructive lesions. Multilevel thoracic osteophytosis. IMPRESSION: No acute cardiopulmonary abnormality. Electronically Signed   By: Rogelia Myers M.D.   On: 01/24/2024 20:43     Procedures   Medications Ordered in the ED  cefTRIAXone  (ROCEPHIN ) 2 g in sodium chloride  0.9 % 100 mL IVPB (0 g Intravenous Stopped 01/24/24 2102)  magnesium  sulfate IVPB 2 g 50 mL (0 g Intravenous Stopped 01/24/24 2304)  potassium chloride  10 mEq in 100 mL IVPB (0 mEq Intravenous Stopped 01/24/24 2304)                                    Medical Decision Making Amount and/or Complexity of Data Reviewed Labs: ordered. Radiology: ordered.  Risk Prescription drug management.   Patient is acutely ill, worsening encephalopathy, afebrile, missed dialysis today, needs evaluation, could be hypercapnic respiratory failure, could be sepsis, consider stroke, consider electrolyte abnormalities, dehydration, seizure activity or postictal state.   This patient presents to the ED for concern of altered mental status, this involves an extensive number of treatment options, and is a  complaint that carries with it a high risk of complications and morbidity.  The differential diagnosis includes as above   Co morbidities / Chronic conditions that complicate the patient evaluation  Renal failure   Additional history obtained:  Additional history obtained from EMR External records from outside source obtained and reviewed including medical record, see HPI   Lab Tests:  I Ordered, and personally interpreted labs.  The pertinent results include: Urinalysis reveals urinary tract infection, antibiotics given, lactate is normal, creatinine is better than baseline and the potassium is lower than I would expect given that the patient is on dialysis and did not go today.  I do not think that the patient needs acute dialysis based on laboratory values   Imaging Studies ordered:  I ordered imaging studies including CT scan of the brain and chest x-ray revealed no acute findings I independently visualized and interpreted imaging which showed no acute infarct hemorrhage or brain tumors, no chest x-ray findings of pneumonia or other signs of infection I agree with the radiologist interpretation   Cardiac Monitoring: / EKG:  The patient was maintained on a cardiac monitor.  I personally viewed and interpreted the cardiac monitored which showed an underlying rhythm of: Normal sinus rhythm   Problem List / ED Course / Critical interventions / Medication management  Ultimately the patient was found to be hypokalemic.  I did give the patient potassium repletion as well as antibiotics, she started to have sundowning and the family reported that they could not take her home because  of her chronic debility, they requested transport home.  They do want her to go home and not be admitted to the hospital and now they endorse that the patient actually told them that she was intentionally not talking to them for what ever reason.  Given that she is back to her baseline with her mental status  here and has no signs of sepsis I think the patient can be discharged home on antibiotics for the UTI I ordered medication including IV fluids and antibiotics Reevaluation of the patient after these medicines showed that the patient improved I have reviewed the patients home medicines and have made adjustments as needed    Social Determinants of Health:  Dialysis, dementia   Test / Admission - Considered:  Considered admission but the patient and the family members would like her to go home I think this is reasonable      Final diagnoses:  Hypokalemia  Acute cystitis without hematuria    ED Discharge Orders          Ordered    cephALEXin  (KEFLEX ) 500 MG capsule  3 times daily        01/24/24 2237               Cleotilde Rogue, MD 01/25/24 1318

## 2024-01-24 NOTE — ED Notes (Signed)
 Pt is a hard stick- IV access has been attempted twice already, prior to pt going to CT- unsuccessful- lab tech at bedside now attempting to draw blood- this nurse on way to attempt IV access. Dr Cleotilde made aware.

## 2024-01-24 NOTE — Telephone Encounter (Signed)
 Please also see the mychart message pt's family sent regarding her

## 2024-01-24 NOTE — Discharge Instructions (Signed)
 Your testing today showed that your potassium was a little bit low, we have given you potassium, it also showed a urinary tract infection, we gave you antibiotics but you will need to keep taking antibiotics for another 7 days.  You have been prescribed Cephalexin , 500 mg capsules.  This is an antibiotic that is used to treat multiple different infections, please take this medicine until it has completed - even if you are feeling better immediately.  As a side effect, this may cause diarrhea in some people, if you are developing any signs of an allergic reaction please stop the medication immediately and call your doctor or return to the emergency department for worsening symptoms  Thank you for allowing us  to treat you in the emergency department today.  After reviewing your examination and potential testing that was done it appears that you are safe to go home.  I would like for you to follow-up with your doctor within the next several days, have them obtain your records and follow-up with them to review all potential tests and results from your visit.  If you should develop severe or worsening symptoms return to the emergency department immediately

## 2024-01-24 NOTE — ED Notes (Signed)
 pt states family member's name and says she is at [employer] pt makes eye contact with this nurse and answers questions at this time. Family says this is the first time she has talked today. Dr Cleotilde made aware.

## 2024-01-24 NOTE — ED Notes (Signed)
 Lab tech notified pt is back from CT

## 2024-01-24 NOTE — Sepsis Progress Note (Signed)
 Elink monitoring for the code sepsis protocol.

## 2024-01-24 NOTE — Telephone Encounter (Signed)
 Thanks for letting me know  Please continue to monitor her

## 2024-01-24 NOTE — ED Triage Notes (Signed)
 BIB EMS from home for lethargy, AMS.  Report about 10-12 today became unresponsive.  Unclear baseline mental status.  NSR on 12 lead.  CBG 175.  Hx of stroke.

## 2024-01-24 NOTE — ED Notes (Signed)
 3 person assist to get pt in [facility] and into car. Pt tolerated well. Pt cannot walk, but able to stand for short time with assistance.

## 2024-01-24 NOTE — Telephone Encounter (Signed)
 Copied from CRM [id number]. Topic: General - Other >> Jan 24, 2024 11:04 AM Jasmin G wrote: Reason for CRM: Mr. Waddell from [facility] called reporting a pt's fall that took place on Monday, pt did not suffer an injury, she tried to get out of the bed in the middle of the night with no help and fell, Mr. Waddell reports that pt is feeling a little bit tired and unfocused.

## 2024-01-25 NOTE — Telephone Encounter (Signed)
 Pt ended up going to ER, please see ER records they did a UA there and pt is on abx

## 2024-01-28 ENCOUNTER — Telehealth (HOSPITAL_BASED_OUTPATIENT_CLINIC_OR_DEPARTMENT_OTHER): Payer: Self-pay | Admitting: *Deleted

## 2024-01-28 NOTE — Telephone Encounter (Signed)
 Post ED Visit - Positive Culture Follow-up  Culture report reviewed by antimicrobial stewardship pharmacist: [facility] []  Rankin Dee, Pharm.D. []  Venetia Gully, Pharm.D., BCPS AQ-ID []  Garrel Crews, Pharm.D., BCPS []  Almarie Lunger, Pharm.D., BCPS []  [place], [place].D., BCPS, AAHIVP []  Rosaline Bihari, Pharm.D., BCPS, AAHIVP []  Vernell Meier, PharmD, BCPS []  Latanya Hint, PharmD, BCPS []  Donald Medley, PharmD, BCPS []  Rocky Bold, PharmD []  Dorothyann Alert, PharmD, BCPS [x]  ,Dorn Buttner, PharmD  [facility] []  Rosaline Edison, PharmD []  Romona Bliss, PharmD []  Dolphus Roller, PharmD []  Veva Seip, Rph []  Vernell Daunt) Leonce, PharmD []  Eva Allis, PharmD []  Rosaline Millet, PharmD []  Iantha Batch, PharmD []  Arvin Gauss, PharmD []  Wanda Hasting, PharmD []  Ronal Rav, PharmD []  Rocky Slade, PharmD []  Bard Jeans, PharmD   Positive urine culture Treated with Cephalexin , organism sensitive to the same and no further patient follow-up is required at this time.  Albino Alan Novak 01/28/2024, 2:34 PM

## 2024-01-29 ENCOUNTER — Telehealth (HOSPITAL_BASED_OUTPATIENT_CLINIC_OR_DEPARTMENT_OTHER): Payer: Self-pay | Admitting: *Deleted

## 2024-01-29 LAB — URINE CULTURE: Culture: >=100000 — AB

## 2024-01-29 LAB — CULTURE, BLOOD (ROUTINE X 2)
Culture: NO GROWTH
Culture: NO GROWTH
Special Requests: ADEQUATE

## 2024-01-29 NOTE — Telephone Encounter (Signed)
 Post ED Visit - Positive Culture Follow-up  Culture report reviewed by antimicrobial stewardship pharmacist: [facility] []  Rankin Dee, Pharm.D. [x]  Venetia Gully, Pharm.D., BCPS AQ-ID []  Garrel Crews, Pharm.D., BCPS []  Almarie Lunger, Pharm.D., BCPS []  [place], [address].D., BCPS, AAHIVP []  Rosaline Bihari, Pharm.D., BCPS, AAHIVP []  Vernell Meier, PharmD, BCPS []  Latanya Hint, PharmD, BCPS []  Donald Medley, PharmD, BCPS []  Rocky Bold, PharmD []  Dorothyann Alert, PharmD, BCPS []  Morene Babe, PharmD  [facility] []  Rosaline Edison, PharmD []  Romona Bliss, PharmD []  Dolphus Roller, PharmD []  Veva Seip, Rph []  Vernell Daunt) Leonce, PharmD []  Eva Allis, PharmD []  Rosaline Millet, PharmD []  Iantha Batch, PharmD []  Arvin Gauss, PharmD []  Wanda Hasting, PharmD []  Ronal Rav, PharmD []  Rocky Slade, PharmD []  Bard Jeans, PharmD   Positive urine culture Treated with cephalexin , organism sensitive to the same and no further patient follow-up is required at this time.  Lorita Barnie Pereyra 01/29/2024, 10:06 AM

## 2024-02-29 NOTE — Progress Notes (Unsigned)
 Virtual Visit via Telephone Note   Because of Jennifer [name] co-morbid illnesses, she is at least at moderate risk for complications without adequate follow up.  This format is felt to be most appropriate for this patient at this time.  The patient did not have access to video technology/had technical difficulties with video requiring transitioning to audio format only (telephone).  All issues noted in this document were discussed and addressed.  No physical exam could be performed with this format.  Please refer to the patient's chart for her consent to telehealth for [facility].    Date:  03/01/2024   ID:  Jennifer [name], DOB 11/10/1955, MRN [id number] The patient was identified using 2 identifiers.  Patient Location: Home Provider Location: Office/Clinic   PCP:  [name], Laine LABOR, MD   [facility] Providers Cardiologist:  Diannah SHAUNNA Maywood, MD     Evaluation Performed:  Follow-Up Visit  History of Present Illness:    [name] is a 68 y.o. female with past medical history of CAD (s/p NSTEMI in 2017 with DESx2 to RCA), prior CVA (occurring in 05/2023 and received TNK with hemorrhagic transformation and received reversal with TXA and cryo), HTN, HLD, IDDM, ESRD and prior tobacco use who presents for a 58-month follow-up telehealth visit.    She was last examined by myself in 07/2023 and was working with home health PT and OT following her prior CVA. Denied any recent chest pain or dyspnea on exertion at that time. No changes were made to her cardiac medications and she was continued on ASA 81 mg daily, Atorvastatin  80 mg daily, Coreg  12.5 mg daily (on nondialysis days) and Lasix  80 mg daily. It was reviewed with Neurology to see if an ILR was recommended and this was not felt to be indicated.  She was hospitalized again in 11/2023 for acute encephalopathy and left-sided weakness but MRI did not show any acute neurologic findings. Was treated for a UTI during  admission. Was again admitted later that month for acute hypoxic respiratory failure in the setting of a pulmonary embolism and community-acquired pneumonia.  Was started on Eliquis  for anticoagulation. Most recent admission was from 6/26 - 12/12/2023 for metabolic encephalopathy which was felt to be due to possible malnutrition and UTI. Initially had septic shock and required pressor support but this was able to be weaned and she was discharged on Midodrine  10 mg 3 times daily.  In talking with the patient today, most history is provided by her daughter given her dementia as she was sleeping during a majority of the encounter. She was initially at [facility] but they have since brought her home. She was working with PT and OT but not making progress. She is mostly bed/chair bound and will stand with assistance at times but they sometimes use a Hoyer lift. She has not complained of any chest pain or palpitations. No recent dyspnea on exertion, orthopnea or pitting edema. Her daughter reports that she did have rales the other day but they removed extra fluid during dialysis and this resolved. Family members continue to help her do home dialysis. They have noticed that her blood pressure has been elevated with SBP in the 160's to 170's. BP was at 157/63 when checked today. She has been on Midodrine  10 mg 3 times daily since her last hospitalization.  Past Medical History:  Diagnosis Date   CAD (coronary artery disease)    cath 5/23 100% dist RCA lesion treated with  2 overlapping Integrity Resolute DES ( 2.25x60mm, 2.25x47mm), 60% mid RCA treated medically, 99% OM2 not amenable to PCI, 70% D1 lesion, EF normal   Hypercholesteremia    Hypertension    Hypothyroidism    Kidney disease    Renal disorder    Stroke (HCC) 10/2018   Thyroid  disease    Type 2 diabetes mellitus (HCC) 10/08/2018   Past Surgical History:  Procedure Laterality Date   ABDOMINAL HYSTERECTOMY     AV FISTULA PLACEMENT Right 06/28/2022    Procedure: RIGHT ARM ARTERIOVENOUS (AV) FISTULA CREATION;  Surgeon: Oris Krystal FALCON, MD;  Location: [facility];  Service: Vascular;  Laterality: Right;   CARDIAC CATHETERIZATION N/A 11/03/2015   Procedure: Left Heart Cath and Coronary Angiography;  Surgeon: Alm LELON Clay, MD;  Location: [facility] INVASIVE CV LAB;  Service: Cardiovascular;  Laterality: N/A;   CARDIAC CATHETERIZATION N/A 11/03/2015   Procedure: Coronary Stent Intervention;  Surgeon: Alm LELON Clay, MD;  Location: [facility] INVASIVE CV LAB;  Service: Cardiovascular;  Laterality: N/A;   CARPAL TUNNEL RELEASE     CORONARY ANGIOPLASTY     1 stent   FISTULA SUPERFICIALIZATION Right 08/16/2022   Procedure: RIGHT ARTERIOVENOUS FISTULA SUPERFICIALIZATION;  Surgeon: Oris Krystal FALCON, MD;  Location: [facility];  Service: Vascular;  Laterality: Right;   TOTAL KNEE ARTHROPLASTY Right 04/02/2019   Procedure: TOTAL KNEE ARTHROPLASTY;  Surgeon: Margrette Taft BRAVO, MD;  Location: [facility];  Service: Orthopedics;  Laterality: Right;     Current Meds  Medication Sig   apixaban  (ELIQUIS ) 5 MG TABS tablet Take 1 tablet (5 mg total) by mouth 2 (two) times daily.   aspirin  81 MG chewable tablet Chew 1 tablet (81 mg total) by mouth daily.   atorvastatin  (LIPITOR ) 80 MG tablet TAKE 1 TABLET BY MOUTH ONCE DAILY AT  6  IN  THE  EVENING   bisacodyl  (DULCOLAX) 10 MG suppository Place 10 mg rectally daily as needed for mild constipation or moderate constipation.   buPROPion  (WELLBUTRIN  SR) 150 MG 12 hr tablet Take 1 tablet (150 mg total) by mouth 2 (two) times daily.   DULoxetine  (CYMBALTA ) 60 MG capsule Take 1 capsule (60 mg total) by mouth daily.   iron sucrose 100 mg in sodium chloride  0.9 % 100 mL Inject 100 mg into the vein every dialysis (low iron).   levothyroxine  (SYNTHROID ) 175 MCG tablet Take 175 mcg by mouth daily before breakfast.   lidocaine -prilocaine  (EMLA ) cream Apply 1 Application topically daily as needed (HD).   Melatonin 10 MG TABS Take 1 tablet by mouth at  bedtime as needed (sleep).   memantine  (NAMENDA ) 10 MG tablet Take 1 tablet (10 mg total) by mouth 2 (two) times daily.   Methoxy PEG-Epoetin Beta (MIRCERA) 200 MCG/0.3ML SOSY 200 mcg by Implant route daily as needed (low RBC at dialysis). Mircera   nitroGLYCERIN  (NITROSTAT ) 0.4 MG SL tablet Place 0.4 mg under the tongue every 5 (five) minutes x 3 doses as needed for chest pain.   OVER THE COUNTER MEDICATION Apply 1 application  topically See admin instructions. Zinc barrier cream- apply to buttocks with each incontinence episode.   polyethylene glycol powder (GLYCOLAX /MIRALAX ) 17 GM/SCOOP powder Take 17 g by mouth daily as needed for moderate constipation.   potassium chloride  SA (KLOR-CON  M) 20 MEQ tablet Take 1 tablet (20 mEq total) by mouth once for 1 dose.   sevelamer  carbonate (RENVELA ) 0.8 g PACK packet Take 0.8 g by mouth 3 (three) times daily with meals.   [DISCONTINUED]  carvedilol  (COREG ) 12.5 MG tablet Take 12.5 mg by mouth 3 (three) times a week. Monday, Thursday, and Friday   [DISCONTINUED] midodrine  (PROAMATINE ) 10 MG tablet Take 1 tablet (10 mg total) by mouth 3 (three) times daily with meals.     Allergies:   Tnkase  [tenecteplase ] and Glucophage [metformin]   Social History   Tobacco Use   Smoking status: Former    Current packs/day: 0.00    Types: Cigarettes    Quit date: 05/13/2021    Years since quitting: 2.8   Smokeless tobacco: Never  Vaping Use   Vaping status: Never Used  Substance Use Topics   Alcohol  use: No    Alcohol /week: 0.0 standard drinks of alcohol    Drug use: No     Family Hx: The patient's family history includes Heart attack in her brother; Heart disease in her mother; Stroke in her sister. There is no history of Breast cancer.  ROS:   Please see the history of present illness.     All other systems reviewed and are negative.   Prior CV studies:   The following studies were reviewed today:  Cardiac Monitor: 07/2023   Patch wear time was  for 30 days.   Normal sinus rhythm predominantly ranging from 62 to 121 bpm with an average HR 78 bpm.   No evidence of atrial or ventricular arrhythmias.   No evidence of high-grade AV block or pauses.   <1% PAC burden and <1% PVC burden.   No patient triggered events noted.   Complete Echo: 11/2023 IMPRESSIONS     1. Left ventricular ejection fraction, by estimation, is 65 to 70%. The  left ventricle has normal function. The left ventricle has no regional  wall motion abnormalities. Left ventricular diastolic parameters are  indeterminate.   2. Right ventricular systolic function is normal. The right ventricular  size is normal. Tricuspid regurgitation signal is inadequate for assessing  PA pressure.   3. The mitral valve is grossly normal. Trivial mitral valve  regurgitation.   4. The aortic valve is tricuspid. There is mild calcification of the  aortic valve. Aortic valve regurgitation is not visualized. Aortic valve  sclerosis/calcification is present, without any evidence of aortic  stenosis.   5. The inferior vena cava is normal in size with greater than 50%  respiratory variability, suggesting right atrial pressure of 3 mmHg.   Limited Echo: 11/2023 IMPRESSIONS     1. Left ventricular ejection fraction, by estimation, is 70 to 75%. The  left ventricle has hyperdynamic function. There is mild concentric left  ventricular hypertrophy.   2. Right ventricular systolic function is normal. The right ventricular  size is normal.   3. The aortic valve is calcified.   4. The inferior vena cava is normal in size with greater than 50%  respiratory variability, suggesting right atrial pressure of 3 mmHg.   Conclusion(s)/Recommendation(s): Limited study for pericardial effusion.  No effusion present.    Labs/Other Tests and Data Reviewed:    Recent Labs: 12/07/2023: B Natriuretic Peptide 59.6 12/12/2023: Magnesium  2.0; TSH 3.318 01/24/2024: ALT 13; BUN 23; Creatinine, Ser  3.98; Hemoglobin 7.8; Platelets 296; Potassium 2.8; Sodium 135   Recent Lipid Panel Lab Results  Component Value Date/Time   CHOL 157 11/29/2023 05:48 AM   TRIG 68 11/29/2023 05:48 AM   HDL 67 11/29/2023 05:48 AM   CHOLHDL 2.3 11/29/2023 05:48 AM   LDLCALC 76 11/29/2023 05:48 AM   LDLDIRECT 154.2 11/04/2011 10:18 AM  Wt Readings from Last 3 Encounters:  03/01/24 200 lb 6.4 oz (90.9 kg)  12/11/23 207 lb 0.2 oz (93.9 kg)  12/04/23 200 lb 9.9 oz (91 kg)        Objective:    Vital Signs:  BP (!) 157/63   Ht 5' 6 (1.676 m)   Wt 200 lb 6.4 oz (90.9 kg)   BMI 32.35 kg/m    Physical Exam not performed as this was a phone visit.   ASSESSMENT & PLAN:    1. CAD - She is s/p NSTEMI in 2017 with DESx2 to RCA. History is limited given her worsening dementia but she has not reported any episodes of chest pain to her family. - Continue current medical therapy with ASA 81 mg daily, Atorvastatin  80 mg daily and Coreg  12.5 mg on nondialysis days. Of note, she was started on Eliquis  earlier this year following her diagnosis of a PE. If remaining on this long-term, would stop ASA.  2. HTN/History of Hypotension during admission - Blood pressure has been elevated when checked at home and suspect this is due to continued use of Midodrine . Will reduce from 10 mg TID to 5 mg TID. I encouraged her daughter to make us  aware of her blood pressure readings within the next few weeks.  If BP remains elevated, would further reduce to 2.5 mg TID. May ultimately be able to discontinue. She does remain on Coreg  12.5 mg once daily on non-HD days.   3. HLD - Followed by her PCP. LDL was previously 102 in 87/7975 and had improved to 76 when checked in 11/2023. Continue current medical therapy with Atorvastatin  80 mg daily.   4. ESRD - She is on dialysis and family members perform this at home. Followed by [facility]. Her daughter reports recent labs were obtained on 02/13/2024 and K+ had normalized to 4.5  and hemoglobin was at 8.6.   Time:   Today, I have spent 14 minutes with the patient with telehealth technology discussing the above problems.     Medication Adjustments/Labs and Tests Ordered: Current medicines are reviewed at length with the patient today.  Concerns regarding medicines are outlined above.   Tests Ordered: No orders of the defined types were placed in this encounter.   Medication Changes: Meds ordered this encounter  Medications   potassium chloride  SA (KLOR-CON  M) 20 MEQ tablet    Sig: Take 1 tablet (20 mEq total) by mouth once for 1 dose.    Supervising Provider:   BRANCH, JONATHAN F [[id number]]   carvedilol  (COREG ) 12.5 MG tablet    Sig: Take 1 tablet (12.5 mg total) by mouth 3 (three) times a week. Monday, Thursday, and Friday    Dispense:  36 tablet    Refill:  3    Supervising Provider:   BRANCH, JONATHAN F [[id number]]   midodrine  (PROAMATINE ) 5 MG tablet    Sig: Take 1 tablet (5 mg total) by mouth 3 (three) times daily.    Dispense:  270 tablet    Refill:  1    9/19 - Dose change; Patient does not need refills yet    Supervising Provider:   ALVAN DORN FALCON [[id number]]    Follow Up:  Virtual Visit  in 6 month(s)  Signed, Laymon CHRISTELLA Qua, PA-C  03/01/2024 2:02 PM    [facility]

## 2024-03-01 ENCOUNTER — Telehealth: Payer: Self-pay | Admitting: *Deleted

## 2024-03-01 ENCOUNTER — Encounter: Payer: Self-pay | Admitting: Student

## 2024-03-01 ENCOUNTER — Ambulatory Visit: Attending: Student | Admitting: Student

## 2024-03-01 VITALS — BP 157/63 | Ht 66.0 in | Wt 200.4 lb

## 2024-03-01 DIAGNOSIS — N186 End stage renal disease: Secondary | ICD-10-CM | POA: Insufficient documentation

## 2024-03-01 DIAGNOSIS — E785 Hyperlipidemia, unspecified: Secondary | ICD-10-CM | POA: Diagnosis present

## 2024-03-01 DIAGNOSIS — I120 Hypertensive chronic kidney disease with stage 5 chronic kidney disease or end stage renal disease: Secondary | ICD-10-CM | POA: Diagnosis not present

## 2024-03-01 DIAGNOSIS — I10 Essential (primary) hypertension: Secondary | ICD-10-CM | POA: Diagnosis present

## 2024-03-01 DIAGNOSIS — I2510 Atherosclerotic heart disease of native coronary artery without angina pectoris: Secondary | ICD-10-CM | POA: Insufficient documentation

## 2024-03-01 MED ORDER — MIDODRINE HCL 5 MG PO TABS: 5.0000 mg | ORAL_TABLET | Freq: Three times a day (TID) | ORAL | 1 refills | Status: DC

## 2024-03-01 MED ORDER — POTASSIUM CHLORIDE CRYS ER 20 MEQ PO TBCR: 20.0000 meq | EXTENDED_RELEASE_TABLET | Freq: Once | ORAL | Status: DC

## 2024-03-01 MED ORDER — CARVEDILOL 12.5 MG PO TABS: 12.5000 mg | ORAL_TABLET | ORAL | 3 refills | Status: DC

## 2024-03-01 NOTE — Telephone Encounter (Signed)
   Patient's daughter Clotilda calling back to follow up

## 2024-03-01 NOTE — Telephone Encounter (Signed)
  Daughter Jennifer [name] is returning call

## 2024-03-01 NOTE — Patient Instructions (Addendum)
 Medication Instructions:   Take Midodrine  5 mg Three Times Daily  Monitor Blood Pressure for 2 weeks and report the readings  *If you need a refill on your cardiac medications before your next appointment, please call your pharmacy*  Lab Work: NONE   If you have labs (blood work) drawn today and your tests are completely normal, you will receive your results only by: MyChart Message (if you have MyChart) OR A paper copy in the mail If you have any lab test that is abnormal or we need to change your treatment, we will call you to review the results.  Testing/Procedures: NONE   Follow-Up: At [facility], you and your health needs are our priority.  As part of our continuing mission to provide you with exceptional heart care, our providers are all part of one team.  This team includes your primary Cardiologist (physician) and Advanced Practice Providers or APPs (Physician Assistants and Nurse Practitioners) who all work together to provide you with the care you need, when you need it.  Your next appointment:   6 month(s)  Provider:   Vishnu Mallipeddi, MD or Laymon Qua, PA-C    We recommend signing up for the patient portal called MyChart.  Sign up information is provided on this After Visit Summary.  MyChart is used to connect with patients for Virtual Visits (Telemedicine).  Patients are able to view lab/test results, encounter notes, upcoming appointments, etc.  Non-urgent messages can be sent to your provider as well.   To learn more about what you can do with MyChart, go to [email or web address].   Other Instructions Thank you for choosing [facility]!

## 2024-03-01 NOTE — Telephone Encounter (Signed)
 Called to obtain telehealth consent. No answer. Left msg to call back.

## 2024-03-01 NOTE — Telephone Encounter (Signed)
  Patient Consent for Virtual Visit        Jennifer [name] has provided verbal consent on 03/01/2024 for a virtual visit (video or telephone).   CONSENT FOR VIRTUAL VISIT FOR:  Jennifer [name]  By participating in this virtual visit I agree to the following:  I hereby voluntarily request, consent and authorize [facility] and its employed or contracted physicians, physician assistants, nurse practitioners or other licensed health care professionals (the Practitioner), to provide me with telemedicine health care services (the "Services) as deemed necessary by the treating Practitioner. I acknowledge and consent to receive the Services by the Practitioner via telemedicine. I understand that the telemedicine visit will involve communicating with the Practitioner through live audiovisual communication technology and the disclosure of certain medical information by electronic transmission. I acknowledge that I have been given the opportunity to request an in-person assessment or other available alternative prior to the telemedicine visit and am voluntarily participating in the telemedicine visit.  I understand that I have the right to withhold or withdraw my consent to the use of telemedicine in the course of my care at any time, without affecting my right to future care or treatment, and that the Practitioner or I may terminate the telemedicine visit at any time. I understand that I have the right to inspect all information obtained and/or recorded in the course of the telemedicine visit and may receive copies of available information for a reasonable fee.  I understand that some of the potential risks of receiving the Services via telemedicine include:  Delay or interruption in medical evaluation due to technological equipment failure or disruption; Information transmitted may not be sufficient (e.g. poor resolution of images) to allow for appropriate medical decision making by the Practitioner;  and/or  In rare instances, security protocols could fail, causing a breach of personal health information.  Furthermore, I acknowledge that it is my responsibility to provide information about my medical history, conditions and care that is complete and accurate to the best of my ability. I acknowledge that Practitioner's advice, recommendations, and/or decision may be based on factors not within their control, such as incomplete or inaccurate data provided by me or distortions of diagnostic images or specimens that may result from electronic transmissions. I understand that the practice of medicine is not an exact science and that Practitioner makes no warranties or guarantees regarding treatment outcomes. I acknowledge that a copy of this consent can be made available to me via my patient portal [facility] MyChart), or I can request a printed copy by calling the office of  [facility].    I understand that my insurance will be billed for this visit.   I have read or had this consent read to me. I understand the contents of this consent, which adequately explains the benefits and risks of the Services being provided via telemedicine.  I have been provided ample opportunity to ask questions regarding this consent and the Services and have had my questions answered to my satisfaction. I give my informed consent for the services to be provided through the use of telemedicine in my medical care

## 2024-03-12 ENCOUNTER — Telehealth: Payer: Self-pay | Admitting: Family Medicine

## 2024-03-12 ENCOUNTER — Ambulatory Visit: Payer: Medicare Other

## 2024-03-12 VITALS — Ht 67.0 in | Wt 200.0 lb

## 2024-03-12 DIAGNOSIS — Z0000 Encounter for general adult medical examination without abnormal findings: Secondary | ICD-10-CM

## 2024-03-12 NOTE — Progress Notes (Signed)
 Subjective:   CHIA MOWERS is a 68 y.o. who presents for a Medicare Wellness preventive visit.  As a reminder, Annual Wellness Visits don't include a physical exam, and some assessments may be limited, especially if this visit is performed virtually. We may recommend an in-person follow-up visit with your provider if needed.  Visit Complete: Virtual I connected with  ROCHANDA HARPHAM daughter Jeanna on 03/12/24 by a audio enabled telemedicine application and verified that I am speaking with the correct person using two identifiers.  Patient Location: Home  Provider Location: Office/Clinic  I discussed the limitations of evaluation and management by telemedicine. The patient expressed understanding and agreed to proceed.  Vital Signs: Because this visit was a virtual/telehealth visit, some criteria may be missing or patient reported. Any vitals not documented were not able to be obtained and vitals that have been documented are patient reported.  VideoDeclined- This patient declined Librarian, academic. Therefore the visit was completed with audio only.  Persons Participating in Visit: Patient.  AWV Questionnaire: No: Patient Medicare AWV questionnaire was not completed prior to this visit.  Cardiac Risk Factors include: advanced age (>66men, >63 women);dyslipidemia;diabetes mellitus;hypertension;obesity (BMI >30kg/m2);sedentary lifestyle     Objective:    Today's Vitals   03/12/24 0904  Weight: 200 lb (90.7 kg)  Height: 5' 7 (1.702 m)   Body mass index is 31.32 kg/m.     03/12/2024    9:13 AM 01/24/2024    7:13 PM 12/07/2023    4:24 PM 11/29/2023    8:27 PM 11/27/2023    3:44 AM 11/21/2023   12:44 PM 11/20/2023    9:40 PM  Advanced Directives  Does Patient Have a Medical Advance Directive? No No Yes  Yes Yes No  Type of Pension scheme manager Power of [employer] Power of  Attorney   Does patient want to make changes to medical advance directive?   No - Patient declined No - Patient declined  No - Patient declined   Copy of Healthcare Power of Attorney in Chart?   No - copy requested, Physician notified   No - copy requested   Would patient like information on creating a medical advance directive?      No - Patient declined No - Patient declined    Current Medications (verified) Outpatient Encounter Medications as of 03/12/2024  Medication Sig   apixaban  (ELIQUIS ) 5 MG TABS tablet Take 1 tablet (5 mg total) by mouth 2 (two) times daily.   aspirin  81 MG chewable tablet Chew 1 tablet (81 mg total) by mouth daily.   atorvastatin  (LIPITOR ) 80 MG tablet TAKE 1 TABLET BY MOUTH ONCE DAILY AT  6  IN  THE  EVENING   bisacodyl  (DULCOLAX) 10 MG suppository Place 10 mg rectally daily as needed for mild constipation or moderate constipation.   buPROPion  (WELLBUTRIN  SR) 150 MG 12 hr tablet Take 1 tablet (150 mg total) by mouth 2 (two) times daily.   carvedilol  (COREG ) 12.5 MG tablet Take 1 tablet (12.5 mg total) by mouth 3 (three) times a week. Monday, Thursday, and Friday   DULoxetine  (CYMBALTA ) 60 MG capsule Take 1 capsule (60 mg total) by mouth daily.   iron sucrose 100 mg in sodium chloride  0.9 % 100 mL Inject 100 mg into the vein every dialysis (low iron).   levothyroxine  (SYNTHROID ) 175 MCG tablet Take 175 mcg by mouth daily before breakfast.  lidocaine -prilocaine  (EMLA ) cream Apply 1 Application topically daily as needed (HD).   Melatonin 10 MG TABS Take 1 tablet by mouth at bedtime as needed (sleep).   memantine  (NAMENDA ) 10 MG tablet Take 1 tablet (10 mg total) by mouth 2 (two) times daily.   Methoxy PEG-Epoetin Beta (MIRCERA) 200 MCG/0.3ML SOSY 200 mcg by Implant route daily as needed (low RBC at dialysis). Mircera   midodrine  (PROAMATINE ) 5 MG tablet Take 1 tablet (5 mg total) by mouth 3 (three) times daily.   nitroGLYCERIN  (NITROSTAT ) 0.4 MG SL tablet Place 0.4 mg  under the tongue every 5 (five) minutes x 3 doses as needed for chest pain.   OVER THE COUNTER MEDICATION Apply 1 application  topically See admin instructions. Zinc barrier cream- apply to buttocks with each incontinence episode.   polyethylene glycol powder (GLYCOLAX /MIRALAX ) 17 GM/SCOOP powder Take 17 g by mouth daily as needed for moderate constipation.   potassium chloride  SA (KLOR-CON  M) 20 MEQ tablet Take 1 tablet (20 mEq total) by mouth once for 1 dose.   sevelamer  carbonate (RENVELA ) 0.8 g PACK packet Take 0.8 g by mouth 3 (three) times daily with meals.   No facility-administered encounter medications on file as of 03/12/2024.    Allergies (verified) Tnkase  [tenecteplase ] and Glucophage [metformin]   History: Past Medical History:  Diagnosis Date   CAD (coronary artery disease)    cath 5/23 100% dist RCA lesion treated with 2 overlapping Integrity Resolute DES ( 2.25x2mm, 2.25x58mm), 60% mid RCA treated medically, 99% OM2 not amenable to PCI, 70% D1 lesion, EF normal   Hypercholesteremia    Hypertension    Hypothyroidism    Kidney disease    Renal disorder    Stroke (HCC) 10/2018   Thyroid  disease    Type 2 diabetes mellitus (HCC) 10/08/2018   Past Surgical History:  Procedure Laterality Date   ABDOMINAL HYSTERECTOMY     AV FISTULA PLACEMENT Right 06/28/2022   Procedure: RIGHT ARM ARTERIOVENOUS (AV) FISTULA CREATION;  Surgeon: Oris Krystal FALCON, MD;  Location: [facility];  Service: Vascular;  Laterality: Right;   CARDIAC CATHETERIZATION N/A 11/03/2015   Procedure: Left Heart Cath and Coronary Angiography;  Surgeon: Alm LELON Clay, MD;  Location: [facility] INVASIVE CV LAB;  Service: Cardiovascular;  Laterality: N/A;   CARDIAC CATHETERIZATION N/A 11/03/2015   Procedure: Coronary Stent Intervention;  Surgeon: Alm LELON Clay, MD;  Location: [facility] INVASIVE CV LAB;  Service: Cardiovascular;  Laterality: N/A;   CARPAL TUNNEL RELEASE     CORONARY ANGIOPLASTY     1 stent   FISTULA  SUPERFICIALIZATION Right 08/16/2022   Procedure: RIGHT ARTERIOVENOUS FISTULA SUPERFICIALIZATION;  Surgeon: Oris Krystal FALCON, MD;  Location: [facility];  Service: Vascular;  Laterality: Right;   TOTAL KNEE ARTHROPLASTY Right 04/02/2019   Procedure: TOTAL KNEE ARTHROPLASTY;  Surgeon: Margrette Taft BRAVO, MD;  Location: [facility];  Service: Orthopedics;  Laterality: Right;   Family History  Problem Relation Age of Onset   Heart disease Mother    Stroke Sister    Heart attack Brother    Breast cancer Neg Hx    Social History   Socioeconomic History   Marital status: Married    Spouse name: Not on file   Number of children: Not on file   Years of education: Not on file   Highest education level: Not on file  Occupational History   Not on file  Tobacco Use   Smoking status: Former    Current packs/day: 0.00  Types: Cigarettes    Quit date: 05/13/2021    Years since quitting: 2.8   Smokeless tobacco: Never  Vaping Use   Vaping status: Never Used  Substance and Sexual Activity   Alcohol  use: No    Alcohol /week: 0.0 standard drinks of alcohol    Drug use: No   Sexual activity: Not on file  Other Topics Concern   Not on file  Social History Narrative   Not on file   Social Drivers of Health   Financial Resource Strain: Low Risk  (03/12/2024)   Overall Financial Resource Strain (CARDIA)    Difficulty of Paying Living Expenses: Not hard at all  Food Insecurity: No Food Insecurity (03/12/2024)   Hunger Vital Sign    Worried About Running Out of Food in the Last Year: Never true    Ran Out of Food in the Last Year: Never true  Transportation Needs: No Transportation Needs (03/12/2024)   PRAPARE - Administrator, Civil Service (Medical): No    Lack of Transportation (Non-Medical): No  Physical Activity: Inactive (03/07/2023)   Exercise Vital Sign    Days of Exercise per Week: 0 days    Minutes of Exercise per Session: 0 min  Stress: Stress Concern Present (03/12/2024)   [employer] of [employer]ational [employer] - Occupational Stress Questionnaire    Feeling of Stress: Rather much  Social Connections: Moderately Isolated (03/12/2024)   Social Connection and Isolation Panel    Frequency of Communication with Friends and Family: Never    Frequency of Social Gatherings with Friends and Family: Never    Attends Religious Services: Never    Database administrator or Organizations: Yes    Attends Banker Meetings: Never    Marital Status: Married    Tobacco Counseling Counseling given: Not Answered    Clinical Intake:  Pre-visit preparation completed: Yes  Pain : No/denies pain        Lab Results  Component Value Date   HGBA1C 5.7 (H) 11/29/2023   HGBA1C 6.3 (H) 11/20/2023   HGBA1C 6.2 (H) 05/20/2023     How often do you need to have someone help you when you read instructions, pamphlets, or other written materials from your doctor or pharmacy?: 1 - Never  Interpreter Needed?: No  Comments: lives with husband and daughter Information entered by :: B.Cari Burgo,LPN   Activities of Daily Living     03/12/2024    9:14 AM 11/29/2023    8:28 PM  In your present state of health, do you have any difficulty performing the following activities:  Hearing? 0 0  Vision? 1 0  Difficulty concentrating or making decisions? 1 1  Walking or climbing stairs? 1   Dressing or bathing? 1   Doing errands, shopping? 1   Preparing Food and eating ? Y   Using the Toilet? Y   In the past six months, have you accidently leaked urine? Y   Do you have problems with loss of bowel control? Y   Managing your Medications? Y   Managing your Finances? Y   Housekeeping or managing your Housekeeping? Y     Patient Care Team: [name], Laine LABOR, MD as PCP - General (Family Medicine) Mallipeddi, Diannah SQUIBB, MD as PCP - Cardiology (Cardiology) Silva Juliene SAUNDERS, DPM as Consulting Physician (Podiatry) Devra Lands, RN as VBCI Care Management  I have updated your  Care Teams any recent Medical Services you may have received from other providers in the past  year.     Assessment:   This is a routine wellness examination for [facility].  Hearing/Vision screen Hearing Screening - Comments:: Daughter says she hears ok Vision Screening - Comments:: Pt daughter says her vision is not good.stroke affected her vision:does not see out of rt eye and vision diminshed in lft heart   Goals Addressed   None    Depression Screen     03/12/2024    9:08 AM 05/30/2023   12:46 PM 03/07/2023    9:05 AM 07/26/2022   11:02 AM 01/26/2022    9:44 AM 10/25/2021    2:18 PM 07/27/2021   10:04 AM  PHQ 2/9 Scores  PHQ - 2 Score 1 3 0 3 0 0 0  PHQ- 9 Score  12  7       Fall Risk     03/12/2024    9:06 AM 05/30/2023   12:46 PM 03/07/2023    9:04 AM 07/26/2022   11:01 AM 01/26/2022    9:44 AM  Fall Risk   Falls in the past year? 1 1 1 1 1   Number falls in past yr: 1 1 1 1 1   Injury with Fall? 0 0 1 0 1  Risk for fall due to : History of fall(s);Impaired vision;Impaired mobility;Impaired balance/gait History of fall(s) History of fall(s);Impaired balance/gait;Orthopedic patient History of fall(s) Impaired balance/gait  Follow up Education provided;Follow up appointment Falls evaluation completed Education provided;Falls prevention discussed;Falls evaluation completed Falls evaluation completed     MEDICARE RISK AT HOME:  Medicare Risk at Home Any stairs in or around the home?: Yes If so, are there any without handrails?: Yes Home free of loose throw rugs in walkways, pet beds, electrical cords, etc?: Yes Adequate lighting in your home to reduce risk of falls?: Yes Life alert?: No Use of a cane, walker or w/c?: Yes Grab bars in the bathroom?: Yes Shower chair or bench in shower?: Yes Elevated toilet seat or a handicapped toilet?: Yes  TIMED UP AND GO:  Was the test performed?  No  Cognitive Function: 6CIT completed    03/12/2024    9:15 AM 06/22/2023    1:39 PM  11/22/2022    1:39 PM  MMSE - Mini Mental State Exam  Not completed: Unable to complete    Orientation to time  3 1  Orientation to Place  3 5  Registration  3 3  Attention/ Calculation  1 1  Recall  2 3  Language- name 2 objects  2 2  Language- repeat  1 1  Language- follow 3 step command  3 3  Language- read & follow direction  1 0  Write a sentence  0 1  Copy design  0 1  Total score  19 21        03/07/2023    9:07 AM  6CIT Screen  What Year? 0 points  What month? 0 points  What time? 0 points  Count back from 20 0 points  Months in reverse 0 points  Repeat phrase 0 points  Total Score 0 points    Immunizations Immunization History  Administered Date(s) Administered   Fluad Quad(high Dose 65+) 07/13/2021, 07/26/2022   Influenza Whole 04/11/2005   Influenza,inj,Quad PF,6+ Mos 04/02/2014, 07/05/2016, 02/25/2019, 03/13/2020   Influenza,trivalent, recombinat, inj, PF 04/18/2023   PNEUMOCOCCAL CONJUGATE-20 04/28/2023   Pneumococcal Polysaccharide-23 05/19/2000, 11/04/2011, 10/17/2017   Td 09/29/1997   Tdap 11/04/2011    Screening Tests Health Maintenance  Topic Date Due  Zoster Vaccines- Shingrix (1 of 2) Never done   FOOT EXAM  09/19/2018   DEXA SCAN  04/28/2020   OPHTHALMOLOGY EXAM  05/28/2021   DTaP/Tdap/Td (3 - Td or Tdap) 11/03/2021   Influenza Vaccine  01/12/2024   COVID-19 Vaccine (1 - 2024-25 season) Never done   Colonoscopy  05/28/2024 (Originally 04/28/2000)   Mammogram  02/27/2025 (Originally 12/11/2022)   Hepatitis C Screening  09/18/2037 (Originally 04/28/1974)   HEMOGLOBIN A1C  05/30/2024   Pneumococcal Vaccine: 50+ Years  Completed   HPV VACCINES  Aged Out   Meningococcal B Vaccine  Aged Out   Hepatitis B Vaccines 19-59 Average Risk  Discontinued    Health Maintenance Items Addressed: Pt family not bringing to appts as says bedridden now  Additional Screening:  Vision Screening: Recommended annual ophthalmology exams for early detection  of glaucoma and other disorders of the eye. Is the patient up to date with their annual eye exam?  Yes  Who is the provider or what is the name of the office in which the patient attends annual eye exams? [facility]  Dental Screening: Recommended annual dental exams for proper oral hygiene  Community Resource Referral / Chronic Care Management: CRR required this visit?  No   CCM required this visit?  No   Plan:    I have personally reviewed and noted the following in the patient's chart:   Medical and social history Use of alcohol , tobacco or illicit drugs  Current medications and supplements including opioid prescriptions. Patient is not currently taking opioid prescriptions. Functional ability and status Nutritional status Physical activity Advanced directives List of other physicians Hospitalizations, surgeries, and ER visits in previous 12 months Vitals Screenings to include cognitive, depression, and falls Referrals and appointments  In addition, I have reviewed and discussed with patient certain preventive protocols, quality metrics, and best practice recommendations. A written personalized care plan for preventive services as well as general preventive health recommendations were provided to patient.   Erminio LITTIE Saris, LPN   [id number]   After Visit Summary: (MyChart) Due to this being a telephonic visit, the after visit summary with patients personalized plan was offered to patient via MyChart   Notes: Please refer to Routing Comments.  Hi! AWV done with pt daughter, Jeanna as she relays pt does not respond and unable to answer questions now. Pt relays pt is in Palliative Care. She relays pt is bedridden, recognizes family sometimes but no one else. She does not communicate much just stares mostly and is not eating well. Pt states Dr Lavon will not order DM supplies due to pt unable to come to office and asked them to have PCP take over DM management and  supplies. They indicates supplies received from [facility].Pt daughter says they need assistance at home caring for pt (HHA). They are unsure how to get this service. They are inquiring about nurse that calls them to ask questions as pt has change in status. Thank you! Erminio

## 2024-03-12 NOTE — Patient Instructions (Signed)
 Ms. Teffeteller,  Thank you for taking the time for your Medicare Wellness Visit. I appreciate your continued commitment to your health goals. Please review the care plan we discussed, and feel free to reach out if I can assist you further.  Medicare recommends these wellness visits once per year to help you and your care team stay ahead of potential health issues. These visits are designed to focus on prevention, allowing your provider to concentrate on managing your acute and chronic conditions during your regular appointments.  Please note that Annual Wellness Visits do not include a physical exam. Some assessments may be limited, especially if the visit was conducted virtually. If needed, we may recommend a separate in-person follow-up with your provider.  Ongoing Care Seeing your primary care provider every 3 to 6 months helps us  monitor your health and provide consistent, personalized care.   Referrals If a referral was made during today's visit and you haven't received any updates within two weeks, please contact the referred provider directly to check on the status.  Recommended Screenings:  Health Maintenance  Topic Date Due   Zoster (Shingles) Vaccine (1 of 2) Never done   Complete foot exam   09/19/2018   DEXA scan (bone density measurement)  04/28/2020   Eye exam for diabetics  05/28/2021   DTaP/Tdap/Td vaccine (3 - Td or Tdap) 11/03/2021   Flu Shot  01/12/2024   COVID-19 Vaccine (1 - 2024-25 season) Never done   Colon Cancer Screening  05/28/2024*   Breast Cancer Screening  02/27/2025*   Hepatitis C Screening  09/18/2037*   Hemoglobin A1C  05/30/2024   Pneumococcal Vaccine for age over 38  Completed   HPV Vaccine  Aged Out   Meningitis B Vaccine  Aged Out   Hepatitis B Vaccine  Discontinued  *Topic was postponed. The date shown is not the original due date.       01/24/2024    7:13 PM  Advanced Directives  Does Patient Have a Medical Advance Directive? No   Advance  Care Planning is important because it: Ensures you receive medical care that aligns with your values, goals, and preferences. Provides guidance to your family and loved ones, reducing the emotional burden of decision-making during critical moments.  Vision: Annual vision screenings are recommended for early detection of glaucoma, cataracts, and diabetic retinopathy. These exams can also reveal signs of chronic conditions such as diabetes and high blood pressure.  Dental: Annual dental screenings help detect early signs of oral cancer, gum disease, and other conditions linked to overall health, including heart disease and diabetes.  Please see the attached documents for additional preventive care recommendations.

## 2024-03-12 NOTE — Telephone Encounter (Signed)
 From pt's AMW visit -the following    Hi! AWV done with pt daughter, Jeanna as she relays pt does not respond and unable to answer questions now. Pt relays pt is in Palliative Care. She relays pt is bedridden, recognizes family sometimes but no one else. She does not communicate much just stares mostly and is not eating well. Pt states Dr Lavon will not order DM supplies due to pt unable to come to office and asked them to have PCP take over DM management and supplies. They indicates supplies received from [facility].Pt daughter says they need assistance at home caring for pt (HHA). They are unsure how to get this service. They are inquiring about nurse that calls them to ask questions as pt has change in status.   I appreciate the update for the medically complex patient  I don't think she is on any diabetes medicine currently   Sounds like she can no longer come in  I know that medicare will not pay for a virtual visit per the update I just received   I want to place a referral for community care for her (ref [id number])  She is in palliative care already I think   Will route to [place]- what else can we offer her ?   Does anyone know who her palliative home care folks are or who a contact would be ?   Any guidance would be helpful, thanks!

## 2024-03-13 NOTE — Telephone Encounter (Signed)
 Attempted to reach [place], daughter.  Left a message to call office.

## 2024-03-16 ENCOUNTER — Telehealth: Payer: Self-pay | Admitting: Family Medicine

## 2024-03-16 DIAGNOSIS — R410 Disorientation, unspecified: Secondary | ICD-10-CM

## 2024-03-16 NOTE — Telephone Encounter (Signed)
 Dr. Randeen,  I am seeing Mrs.Gwenn for a palliative care f/u visit. Her daughter who is a nurse wonders if patient could have an order for a UA and culture. She is willing to do straight cath if she can get ahold of the equipment. She is calling [place] to check.  She is experiencing more agitation around 9:30-10am and I am willing to write for 25mg  seroquel  to help her calm down and rest if her UA is neg.  I've also talked to family re; goals of care. She is losing weight, not wanting to eat, sleeping more during the day and "wants to stop dialysis". Family not ready for her to stop dialysis. MOST form done but still wants any treatable condition treated.  I'll be available until about 6.  Reach out if needed.  Shona Rigg [facility]  Palliative Care NP  [facility]     Orders done for [facility] /lab [facility]

## 2024-03-19 ENCOUNTER — Encounter: Payer: Self-pay | Admitting: Family Medicine

## 2024-03-19 NOTE — Telephone Encounter (Signed)
 Orders released into [facility]'s system and pt's family notified via mychart to let them know

## 2024-03-23 LAB — URINALYSIS, ROUTINE W REFLEX MICROSCOPIC
Bilirubin, UA: NEGATIVE
Glucose, UA: NEGATIVE
Ketones, UA: NEGATIVE
Nitrite, UA: NEGATIVE
Specific Gravity, UA: 1.013 (ref 1.005–1.030)
Urobilinogen, Ur: 0.2 mg/dL (ref 0.2–1.0)
pH, UA: 8.5 — ABNORMAL HIGH (ref 5.0–7.5)

## 2024-03-23 LAB — MICROSCOPIC EXAMINATION: Casts: NONE SEEN /LPF

## 2024-03-24 ENCOUNTER — Ambulatory Visit: Payer: Self-pay | Admitting: Family Medicine

## 2024-03-26 LAB — URINE CULTURE

## 2024-03-26 MED ORDER — SULFAMETHOXAZOLE-TRIMETHOPRIM 800-160 MG PO TABS
1.0000 | ORAL_TABLET | Freq: Every day | ORAL | 0 refills | Status: DC
Start: 2024-03-26 — End: 2024-04-13

## 2024-04-01 ENCOUNTER — Encounter: Payer: Self-pay | Admitting: Family Medicine

## 2024-04-03 ENCOUNTER — Emergency Department (HOSPITAL_COMMUNITY)

## 2024-04-03 ENCOUNTER — Other Ambulatory Visit: Payer: Self-pay

## 2024-04-03 ENCOUNTER — Encounter (HOSPITAL_COMMUNITY): Payer: Self-pay | Admitting: Emergency Medicine

## 2024-04-03 ENCOUNTER — Emergency Department (HOSPITAL_COMMUNITY): Admission: EM | Admit: 2024-04-03 | Discharge: 2024-04-03 | Disposition: A | Discharge status: Home / Self Care

## 2024-04-03 DIAGNOSIS — F0390 Unspecified dementia without behavioral disturbance: Secondary | ICD-10-CM | POA: Insufficient documentation

## 2024-04-03 DIAGNOSIS — N3091 Cystitis, unspecified with hematuria: Secondary | ICD-10-CM | POA: Diagnosis not present

## 2024-04-03 DIAGNOSIS — I700 Atherosclerosis of aorta: Secondary | ICD-10-CM | POA: Diagnosis not present

## 2024-04-03 DIAGNOSIS — I517 Cardiomegaly: Secondary | ICD-10-CM | POA: Insufficient documentation

## 2024-04-03 DIAGNOSIS — N186 End stage renal disease: Secondary | ICD-10-CM | POA: Diagnosis not present

## 2024-04-03 DIAGNOSIS — Z992 Dependence on renal dialysis: Secondary | ICD-10-CM | POA: Diagnosis not present

## 2024-04-03 DIAGNOSIS — Z7982 Long term (current) use of aspirin: Secondary | ICD-10-CM | POA: Diagnosis not present

## 2024-04-03 DIAGNOSIS — E876 Hypokalemia: Secondary | ICD-10-CM | POA: Insufficient documentation

## 2024-04-03 DIAGNOSIS — R0602 Shortness of breath: Secondary | ICD-10-CM | POA: Diagnosis present

## 2024-04-03 DIAGNOSIS — K5730 Diverticulosis of large intestine without perforation or abscess without bleeding: Secondary | ICD-10-CM | POA: Diagnosis not present

## 2024-04-03 DIAGNOSIS — R1030 Lower abdominal pain, unspecified: Secondary | ICD-10-CM | POA: Diagnosis present

## 2024-04-03 DIAGNOSIS — I3139 Other pericardial effusion (noninflammatory): Secondary | ICD-10-CM | POA: Diagnosis not present

## 2024-04-03 DIAGNOSIS — R109 Unspecified abdominal pain: Secondary | ICD-10-CM

## 2024-04-03 DIAGNOSIS — N390 Urinary tract infection, site not specified: Secondary | ICD-10-CM | POA: Diagnosis present

## 2024-04-03 LAB — URINALYSIS, ROUTINE W REFLEX MICROSCOPIC

## 2024-04-03 LAB — COMPREHENSIVE METABOLIC PANEL WITH GFR
ALT: 6 U/L (ref 0–44)
AST: 17 U/L (ref 15–41)
Albumin: 3.7 g/dL (ref 3.5–5.0)
Alkaline Phosphatase: 83 U/L (ref 38–126)
Anion gap: 12 (ref 5–15)
BUN: 18 mg/dL (ref 8–23)
CO2: 30 mmol/L (ref 22–32)
Calcium: 9.1 mg/dL (ref 8.9–10.3)
Chloride: 95 mmol/L — ABNORMAL LOW (ref 98–111)
Creatinine, Ser: 4.42 mg/dL — ABNORMAL HIGH (ref 0.44–1.00)
GFR, Estimated: 10 mL/min — ABNORMAL LOW (ref >60)
Glucose, Bld: 98 mg/dL (ref 70–99)
Potassium: 3.2 mmol/L — ABNORMAL LOW (ref 3.5–5.1)
Sodium: 137 mmol/L (ref 135–145)
Total Bilirubin: 0.4 mg/dL (ref 0.0–1.2)
Total Protein: 6 g/dL — ABNORMAL LOW (ref 6.5–8.1)

## 2024-04-03 LAB — CBC
HCT: 30.9 % — ABNORMAL LOW (ref 36.0–46.0)
Hemoglobin: 9.6 g/dL — ABNORMAL LOW (ref 12.0–15.0)
MCH: 28.1 pg (ref 26.0–34.0)
MCHC: 31.1 g/dL (ref 30.0–36.0)
MCV: 90.4 fL (ref 80.0–100.0)
Platelets: 233 K/uL (ref 150–400)
RBC: 3.42 MIL/uL — ABNORMAL LOW (ref 3.87–5.11)
RDW: 14.5 % (ref 11.5–15.5)
WBC: 6.6 K/uL (ref 4.0–10.5)
nRBC: 0 % (ref 0.0–0.2)

## 2024-04-03 LAB — RESP PANEL BY RT-PCR (RSV, FLU A&B, COVID)  RVPGX2
Influenza A by PCR: NEGATIVE
Influenza B by PCR: NEGATIVE
Resp Syncytial Virus by PCR: NEGATIVE
SARS Coronavirus 2 by RT PCR: NEGATIVE

## 2024-04-03 LAB — URINALYSIS, MICROSCOPIC (REFLEX): RBC / HPF: >50 RBC/hpf (ref 0–5)

## 2024-04-03 LAB — LIPASE, BLOOD: Lipase: 12 U/L (ref 11–51)

## 2024-04-03 MED ORDER — CEPHALEXIN 500 MG PO CAPS
500.0000 mg | ORAL_CAPSULE | Freq: Once | ORAL | Status: AC
  Administered 2024-04-03: 500 mg via ORAL
  Filled 2024-04-03: qty 1

## 2024-04-03 MED ORDER — HYDROCODONE-ACETAMINOPHEN 5-325 MG PO TABS: 1.0000 | ORAL_TABLET | Freq: Four times a day (QID) | ORAL | 0 refills | Status: DC | PRN

## 2024-04-03 MED ORDER — CEPHALEXIN 500 MG PO CAPS: 500.0000 mg | ORAL_CAPSULE | Freq: Four times a day (QID) | ORAL | 0 refills | Status: DC

## 2024-04-03 MED ORDER — FENTANYL CITRATE (PF) 100 MCG/2ML IJ SOLN
50.0000 ug | Freq: Once | INTRAMUSCULAR | Status: AC
  Administered 2024-04-03: 50 ug via INTRAVENOUS
  Filled 2024-04-03: qty 2

## 2024-04-03 MED ORDER — HYDROCODONE-ACETAMINOPHEN 5-325 MG PO TABS
1.0000 | ORAL_TABLET | Freq: Once | ORAL | Status: AC
  Administered 2024-04-03: 1 via ORAL
  Filled 2024-04-03: qty 1

## 2024-04-03 NOTE — ED Triage Notes (Addendum)
 Pt from home to ed via [facility]. C/o of lower abdominal pain. Denies N/V/D. Pt is a dialysis pt 4xdays a week at home through fistula right arm. Unknown if produces urine. Pt states, I try Hx of dementia. Pt uses a hoyer lift for movement. Pt has lower right leg injury.   Per ems 99.1 189/73 95% ra HR78

## 2024-04-03 NOTE — ED Notes (Signed)
 Patient transported to CT

## 2024-04-03 NOTE — Discharge Instructions (Addendum)
 Take your antibiotics as prescribed.  You can use your Norco as needed for pain.  Follow-up with your urologist.  Call the number below to make an appointment.

## 2024-04-03 NOTE — Telephone Encounter (Signed)
 This patient cannot come to the office (physically)  Home care got the last urine sample I think Jennifer [name] was going to reach out to them about a care at home program (she is in palliative care)  She needs medical attention for this and unfortunately I am off thurs and Friday and not available  Unsure what our options are  Thanks ! Tough situation

## 2024-04-03 NOTE — ED Provider Notes (Signed)
 [place] EMERGENCY DEPARTMENT AT [facility] Provider Note   CSN: [id number] Arrival date & time: 04/03/24  1643     Patient presents with: Abdominal Pain   Jennifer [name] is a 68 y.o. female.  {Add pertinent medical, surgical, social history, OB history to HPI:2330} 68 year old female presents for evaluation of abdominal pain.  Patient is a very poor historian she has a history of dementia.  States she has lower abdominal pain but denies any other symptoms or concerns.  On review of chart she was recently diagnosed with a UTI.  Also had a recent injury to her right lower leg and now has a hematoma on it.  She is currently on Bactrim for the UTI however her pain has been getting worse.  She is a dialysis patient does dialysis at home 4 days a week   Abdominal Pain Associated symptoms: dysuria   Associated symptoms: no chest pain, no chills, no cough, no fever, no hematuria, no shortness of breath, no sore throat and no vomiting        Prior to Admission medications   Medication Sig Start Date End Date Taking? Authorizing Provider  apixaban  (ELIQUIS ) 5 MG TABS tablet Take 1 tablet (5 mg total) by mouth 2 (two) times daily. 01/11/24   [name], Laine LABOR, MD  aspirin  81 MG chewable tablet Chew 1 tablet (81 mg total) by mouth daily. 05/24/23   de Clint Kill, Cortney E, NP  atorvastatin  (LIPITOR ) 80 MG tablet TAKE 1 TABLET BY MOUTH ONCE DAILY AT  6  IN  THE  EVENING 09/06/23   Mallipeddi, Vishnu P, MD  bisacodyl  (DULCOLAX) 10 MG suppository Place 10 mg rectally daily as needed for mild constipation or moderate constipation.    [provider]  buPROPion  (WELLBUTRIN  SR) 150 MG 12 hr tablet Take 1 tablet (150 mg total) by mouth 2 (two) times daily. 12/26/23   [name], Laine LABOR, MD  carvedilol  (COREG ) 12.5 MG tablet Take 1 tablet (12.5 mg total) by mouth 3 (three) times a week. Monday, Thursday, and Friday 03/01/24   Strader, Laymon HERO, PA-C  DULoxetine  (CYMBALTA ) 60 MG capsule Take  1 capsule (60 mg total) by mouth daily. 12/26/23   [name], Laine LABOR, MD  iron sucrose 100 mg in sodium chloride  0.9 % 100 mL Inject 100 mg into the vein every dialysis (low iron).    [provider]  levothyroxine  (SYNTHROID ) 175 MCG tablet Take 175 mcg by mouth daily before breakfast.    [provider]  lidocaine -prilocaine  (EMLA ) cream Apply 1 Application topically daily as needed (HD). 01/10/24   [provider]  Melatonin 10 MG TABS Take 1 tablet by mouth at bedtime as needed (sleep).    [provider]  memantine  (NAMENDA ) 10 MG tablet Take 1 tablet (10 mg total) by mouth 2 (two) times daily. 12/27/23   Whitfield Raisin, NP  Methoxy PEG-Epoetin Beta (MIRCERA) 200 MCG/0.3ML SOSY 200 mcg by Implant route daily as needed (low RBC at dialysis). Mircera    [provider]  midodrine  (PROAMATINE ) 5 MG tablet Take 1 tablet (5 mg total) by mouth 3 (three) times daily. 03/01/24   Strader, Laymon HERO, PA-C  nitroGLYCERIN  (NITROSTAT ) 0.4 MG SL tablet Place 0.4 mg under the tongue every 5 (five) minutes x 3 doses as needed for chest pain.    [provider]  OVER THE COUNTER MEDICATION Apply 1 application  topically See admin instructions. Zinc barrier cream- apply to buttocks with each  incontinence episode.    [provider]  polyethylene glycol powder (GLYCOLAX /MIRALAX ) 17 GM/SCOOP powder Take 17 g by mouth daily as needed for moderate constipation. 12/12/23   Sheikh, Omair Latif, DO  potassium chloride  SA (KLOR-CON  M) 20 MEQ tablet Take 1 tablet (20 mEq total) by mouth once for 1 dose. 03/01/24 03/12/24  Strader, Laymon HERO, PA-C  sevelamer  carbonate (RENVELA ) 0.8 g PACK packet Take 0.8 g by mouth 3 (three) times daily with meals.    [provider]  sulfamethoxazole-trimethoprim (BACTRIM DS) 800-160 MG tablet Take 1 tablet by mouth daily. 03/26/24   [name], Laine LABOR, MD    Allergies: Tnkase  [tenecteplase ] and Glucophage [metformin]    Review  of Systems  Constitutional:  Negative for chills and fever.  HENT:  Negative for ear pain and sore throat.   Eyes:  Negative for pain and visual disturbance.  Respiratory:  Negative for cough and shortness of breath.   Cardiovascular:  Negative for chest pain and palpitations.  Gastrointestinal:  Positive for abdominal pain. Negative for vomiting.  Genitourinary:  Positive for dysuria. Negative for hematuria.  Musculoskeletal:  Negative for arthralgias and back pain.  Skin:  Negative for color change and rash.  Neurological:  Negative for seizures and syncope.  All other systems reviewed and are negative.   Updated Vital Signs BP (!) 184/59 (BP Location: Left Arm)   Pulse 80   Temp 98.6 F (37 C) (Oral)   Resp 16   Ht 5' 7 (1.702 m)   Wt 85.3 kg   SpO2 93%   BMI 29.44 kg/m   Physical Exam Vitals and nursing note reviewed.  Constitutional:      General: She is not in acute distress.    Appearance: She is well-developed. She is obese. She is ill-appearing.  HENT:     Head: Normocephalic and atraumatic.  Eyes:     Conjunctiva/sclera: Conjunctivae normal.  Cardiovascular:     Rate and Rhythm: Normal rate and regular rhythm.     Heart sounds: No murmur heard. Pulmonary:     Effort: Pulmonary effort is normal. No respiratory distress.     Breath sounds: Normal breath sounds.  Abdominal:     Palpations: Abdomen is soft.     Tenderness: There is abdominal tenderness in the suprapubic area.  Musculoskeletal:        General: No swelling.     Cervical back: Neck supple.  Skin:    General: Skin is warm and dry.     Capillary Refill: Capillary refill takes less than 2 seconds.     Comments: Large hematoma with blister on tibia  Neurological:     Mental Status: She is alert.  Psychiatric:        Mood and Affect: Mood normal.     (all labs ordered are listed, but only abnormal results are displayed) Labs Reviewed  RESP PANEL BY RT-PCR (RSV, FLU A&B, COVID)  [id number]   LIPASE, BLOOD  COMPREHENSIVE METABOLIC PANEL WITH GFR  CBC  URINALYSIS, ROUTINE W REFLEX MICROSCOPIC    EKG: EKG Interpretation Date/Time:  Wednesday April 03 2024 17:01:55 EDT Ventricular Rate:  79 PR Interval:  59 QRS Duration:  116 QT Interval:  438 QTC Calculation: 503 R Axis:   38  Text Interpretation: Sinus rhythm Short PR interval Probable left atrial enlargement Nonspecific intraventricular conduction delay Baseline artifact Compared with prior EKG from 01/24/2024 Confirmed by Gennaro Bouchard ([id number]) on 04/03/2024 5:26:28 PM  Radiology: No results found.  {Document  cardiac monitor, telemetry assessment procedure when appropriate:32947} Procedures   Medications Ordered in the ED - No data to display    {Click here for ABCD2, HEART and other calculators REFRESH Note before signing:1}                              Medical Decision Making Amount and/or Complexity of Data Reviewed Labs: ordered. Radiology: ordered.   ***  {Document critical care time when appropriate  Document review of labs and clinical decision tools ie CHADS2VASC2, etc  Document your independent review of radiology images and any outside records  Document your discussion with family members, caretakers and with consultants  Document social determinants of health affecting pt's care  Document your decision making why or why not admission, treatments were needed:32947:::1}   Final diagnoses:  None    ED Discharge Orders     None

## 2024-04-04 NOTE — Telephone Encounter (Signed)
 Family took pt to ER yesterday and was treated for sxs. FYI to PCP

## 2024-04-05 ENCOUNTER — Telehealth: Payer: Self-pay | Admitting: *Deleted

## 2024-04-05 ENCOUNTER — Encounter: Payer: Self-pay | Admitting: Family Medicine

## 2024-04-05 NOTE — Telephone Encounter (Signed)
Sent mychart letting pt know Dr. [name]'s comments. 

## 2024-04-05 NOTE — Telephone Encounter (Signed)
 Thank you for the heads up  Glad cardiology and nephrology are on it and aware  Hopefully the hematoma will improve now that she is off the anticoagulant (blood thinner) Optimally a wound clinic referral would be helpful but from what I understand , it is very difficult to get her out for appointment  Have home care continue to monitor   Update me next week with how she is doing Thanks for much

## 2024-04-05 NOTE — Telephone Encounter (Signed)
 Pt's family sent a message saying:  Hello, a quick update on mom. Seen in ER on 10/22 for the hematuria and abd pain, dx hemorrhagic cystitis, they couldn't do much testing on her urine as it was mostly blood but said it had a little bacteria and they started her on Keflex  500mg  4x day. Her kidney care team said that was too much for her as a ESRD patient and said to only give her 1 (500mg  cap) daily.    Cardiology said to stop Eliquis  and they will work with you on a plan.    Here is a pic of her leg from this morning, we are still just cleaning and wrapping daily, if we need to do anything different please let us  know.    I have left a message to schedule f/u with urology per ED instructions, no response yet. I also let them know my concern with the large blood clot found in her brief yesterday morning. She continues to have some bleeding and lower abdominal pain.    **PIC OF LEG UNDER MEDIA TAB**

## 2024-04-06 ENCOUNTER — Emergency Department (HOSPITAL_COMMUNITY)

## 2024-04-06 ENCOUNTER — Observation Stay (HOSPITAL_COMMUNITY)

## 2024-04-06 ENCOUNTER — Inpatient Hospital Stay (HOSPITAL_COMMUNITY)
Admission: EM | Admit: 2024-04-06 | Discharge: 2024-04-13 | DRG: 100 | Disposition: A | Discharge status: Home / Self Care | Attending: Internal Medicine | Admitting: Internal Medicine

## 2024-04-06 ENCOUNTER — Encounter (HOSPITAL_COMMUNITY): Payer: Self-pay

## 2024-04-06 ENCOUNTER — Other Ambulatory Visit: Payer: Self-pay

## 2024-04-06 DIAGNOSIS — W228XXA Striking against or struck by other objects, initial encounter: Secondary | ICD-10-CM | POA: Diagnosis present

## 2024-04-06 DIAGNOSIS — G40209 Localization-related (focal) (partial) symptomatic epilepsy and epileptic syndromes with complex partial seizures, not intractable, without status epilepticus: Principal | ICD-10-CM | POA: Diagnosis present

## 2024-04-06 DIAGNOSIS — G8194 Hemiplegia, unspecified affecting left nondominant side: Secondary | ICD-10-CM | POA: Diagnosis present

## 2024-04-06 DIAGNOSIS — Z8679 Personal history of other diseases of the circulatory system: Secondary | ICD-10-CM

## 2024-04-06 DIAGNOSIS — Z96651 Presence of right artificial knee joint: Secondary | ICD-10-CM | POA: Diagnosis present

## 2024-04-06 DIAGNOSIS — Z823 Family history of stroke: Secondary | ICD-10-CM

## 2024-04-06 DIAGNOSIS — R569 Unspecified convulsions: Principal | ICD-10-CM

## 2024-04-06 DIAGNOSIS — I120 Hypertensive chronic kidney disease with stage 5 chronic kidney disease or end stage renal disease: Secondary | ICD-10-CM | POA: Diagnosis not present

## 2024-04-06 DIAGNOSIS — F0390 Unspecified dementia without behavioral disturbance: Secondary | ICD-10-CM | POA: Diagnosis not present

## 2024-04-06 DIAGNOSIS — F0153 Vascular dementia, unspecified severity, with mood disturbance: Secondary | ICD-10-CM | POA: Diagnosis present

## 2024-04-06 DIAGNOSIS — R310 Gross hematuria: Secondary | ICD-10-CM | POA: Diagnosis present

## 2024-04-06 DIAGNOSIS — Z79890 Hormone replacement therapy (postmenopausal): Secondary | ICD-10-CM

## 2024-04-06 DIAGNOSIS — D631 Anemia in chronic kidney disease: Secondary | ICD-10-CM | POA: Diagnosis present

## 2024-04-06 DIAGNOSIS — Z888 Allergy status to other drugs, medicaments and biological substances status: Secondary | ICD-10-CM

## 2024-04-06 DIAGNOSIS — S81801A Unspecified open wound, right lower leg, initial encounter: Secondary | ICD-10-CM | POA: Diagnosis present

## 2024-04-06 DIAGNOSIS — Z7401 Bed confinement status: Secondary | ICD-10-CM

## 2024-04-06 DIAGNOSIS — G9341 Metabolic encephalopathy: Secondary | ICD-10-CM | POA: Diagnosis present

## 2024-04-06 DIAGNOSIS — N2581 Secondary hyperparathyroidism of renal origin: Secondary | ICD-10-CM | POA: Diagnosis present

## 2024-04-06 DIAGNOSIS — Z955 Presence of coronary angioplasty implant and graft: Secondary | ICD-10-CM

## 2024-04-06 DIAGNOSIS — N3091 Cystitis, unspecified with hematuria: Secondary | ICD-10-CM | POA: Diagnosis present

## 2024-04-06 DIAGNOSIS — I5032 Chronic diastolic (congestive) heart failure: Secondary | ICD-10-CM | POA: Diagnosis present

## 2024-04-06 DIAGNOSIS — Z79899 Other long term (current) drug therapy: Secondary | ICD-10-CM

## 2024-04-06 DIAGNOSIS — Z1611 Resistance to penicillins: Secondary | ICD-10-CM | POA: Diagnosis present

## 2024-04-06 DIAGNOSIS — Z86711 Personal history of pulmonary embolism: Secondary | ICD-10-CM

## 2024-04-06 DIAGNOSIS — F32A Depression, unspecified: Secondary | ICD-10-CM | POA: Diagnosis present

## 2024-04-06 DIAGNOSIS — Z7901 Long term (current) use of anticoagulants: Secondary | ICD-10-CM

## 2024-04-06 DIAGNOSIS — Z66 Do not resuscitate: Secondary | ICD-10-CM | POA: Diagnosis present

## 2024-04-06 DIAGNOSIS — I2510 Atherosclerotic heart disease of native coronary artery without angina pectoris: Secondary | ICD-10-CM | POA: Diagnosis present

## 2024-04-06 DIAGNOSIS — Z8249 Family history of ischemic heart disease and other diseases of the circulatory system: Secondary | ICD-10-CM

## 2024-04-06 DIAGNOSIS — I132 Hypertensive heart and chronic kidney disease with heart failure and with stage 5 chronic kidney disease, or end stage renal disease: Secondary | ICD-10-CM | POA: Diagnosis present

## 2024-04-06 DIAGNOSIS — B961 Klebsiella pneumoniae [K. pneumoniae] as the cause of diseases classified elsewhere: Secondary | ICD-10-CM | POA: Diagnosis present

## 2024-04-06 DIAGNOSIS — N186 End stage renal disease: Secondary | ICD-10-CM | POA: Diagnosis not present

## 2024-04-06 DIAGNOSIS — Z87891 Personal history of nicotine dependence: Secondary | ICD-10-CM

## 2024-04-06 DIAGNOSIS — Z7982 Long term (current) use of aspirin: Secondary | ICD-10-CM

## 2024-04-06 DIAGNOSIS — E039 Hypothyroidism, unspecified: Secondary | ICD-10-CM | POA: Diagnosis present

## 2024-04-06 DIAGNOSIS — I959 Hypotension, unspecified: Secondary | ICD-10-CM | POA: Diagnosis present

## 2024-04-06 DIAGNOSIS — Z992 Dependence on renal dialysis: Secondary | ICD-10-CM

## 2024-04-06 DIAGNOSIS — E1122 Type 2 diabetes mellitus with diabetic chronic kidney disease: Secondary | ICD-10-CM | POA: Diagnosis present

## 2024-04-06 DIAGNOSIS — E7800 Pure hypercholesterolemia, unspecified: Secondary | ICD-10-CM | POA: Diagnosis present

## 2024-04-06 DIAGNOSIS — E876 Hypokalemia: Secondary | ICD-10-CM | POA: Diagnosis present

## 2024-04-06 DIAGNOSIS — I252 Old myocardial infarction: Secondary | ICD-10-CM

## 2024-04-06 DIAGNOSIS — Z8673 Personal history of transient ischemic attack (TIA), and cerebral infarction without residual deficits: Secondary | ICD-10-CM

## 2024-04-06 DIAGNOSIS — K5900 Constipation, unspecified: Secondary | ICD-10-CM | POA: Diagnosis present

## 2024-04-06 HISTORY — DX: Acute myocardial infarction, unspecified: I21.9

## 2024-04-06 LAB — CBC
HCT: 31 % — ABNORMAL LOW (ref 36.0–46.0)
Hemoglobin: 9.8 g/dL — ABNORMAL LOW (ref 12.0–15.0)
MCH: 29.1 pg (ref 26.0–34.0)
MCHC: 31.6 g/dL (ref 30.0–36.0)
MCV: 92 fL (ref 80.0–100.0)
Platelets: 249 K/uL (ref 150–400)
RBC: 3.37 MIL/uL — ABNORMAL LOW (ref 3.87–5.11)
RDW: 14.8 % (ref 11.5–15.5)
WBC: 6.5 K/uL (ref 4.0–10.5)
nRBC: 0 % (ref 0.0–0.2)

## 2024-04-06 LAB — URINALYSIS, ROUTINE W REFLEX MICROSCOPIC: RBC / HPF: >50 RBC/hpf (ref 0–5)

## 2024-04-06 LAB — COMPREHENSIVE METABOLIC PANEL WITH GFR
ALT: 6 U/L (ref 0–44)
AST: 18 U/L (ref 15–41)
Albumin: 3.8 g/dL (ref 3.5–5.0)
Alkaline Phosphatase: 100 U/L (ref 38–126)
Anion gap: 18 — ABNORMAL HIGH (ref 5–15)
BUN: 23 mg/dL (ref 8–23)
CO2: 25 mmol/L (ref 22–32)
Calcium: 9.1 mg/dL (ref 8.9–10.3)
Chloride: 94 mmol/L — ABNORMAL LOW (ref 98–111)
Creatinine, Ser: 4.53 mg/dL — ABNORMAL HIGH (ref 0.44–1.00)
GFR, Estimated: 10 mL/min — ABNORMAL LOW (ref >60)
Glucose, Bld: 157 mg/dL — ABNORMAL HIGH (ref 70–99)
Potassium: 3.8 mmol/L (ref 3.5–5.1)
Sodium: 138 mmol/L (ref 135–145)
Total Bilirubin: 0.4 mg/dL (ref 0.0–1.2)
Total Protein: 6.3 g/dL — ABNORMAL LOW (ref 6.5–8.1)

## 2024-04-06 LAB — ETHANOL: Alcohol, Ethyl (B): <15 mg/dL (ref <15)

## 2024-04-06 LAB — RAPID URINE DRUG SCREEN, HOSP PERFORMED
Amphetamines: NOT DETECTED
Barbiturates: NOT DETECTED
Benzodiazepines: NOT DETECTED
Cocaine: NOT DETECTED
Opiates: POSITIVE — AB
Tetrahydrocannabinol: NOT DETECTED

## 2024-04-06 LAB — DIFFERENTIAL
Abs Immature Granulocytes: 0.05 K/uL (ref 0.00–0.07)
Basophils Absolute: 0 K/uL (ref 0.0–0.1)
Basophils Relative: 1 %
Eosinophils Absolute: 0.1 K/uL (ref 0.0–0.5)
Eosinophils Relative: 1 %
Immature Granulocytes: 1 %
Lymphocytes Relative: 13 %
Lymphs Abs: 0.8 K/uL (ref 0.7–4.0)
Monocytes Absolute: 0.3 K/uL (ref 0.1–1.0)
Monocytes Relative: 5 %
Neutro Abs: 5.2 K/uL (ref 1.7–7.7)
Neutrophils Relative %: 79 %

## 2024-04-06 LAB — PROTIME-INR
INR: 1.2 (ref 0.8–1.2)
Prothrombin Time: 15.8 s — ABNORMAL HIGH (ref 11.4–15.2)

## 2024-04-06 LAB — APTT: aPTT: 82 s — ABNORMAL HIGH (ref 24–36)

## 2024-04-06 MED ORDER — ATORVASTATIN CALCIUM 80 MG PO TABS
80.0000 mg | ORAL_TABLET | Freq: Every evening | ORAL | Status: DC
Start: 2024-04-06 — End: 2024-04-13
  Administered 2024-04-07 – 2024-04-12 (×6): 80 mg via ORAL
  Filled 2024-04-06 (×6): qty 1

## 2024-04-06 MED ORDER — LORAZEPAM 1 MG PO TABS: 1.0000 mg | ORAL_TABLET | Freq: Once | ORAL | Status: DC

## 2024-04-06 MED ORDER — LORAZEPAM 2 MG/ML IJ SOLN: 1.0000 mg | Freq: Once | INTRAMUSCULAR | Status: DC

## 2024-04-06 MED ORDER — AMLODIPINE BESYLATE 5 MG PO TABS
5.0000 mg | ORAL_TABLET | Freq: Every day | ORAL | Status: DC
  Administered 2024-04-08 – 2024-04-13 (×6): 5 mg via ORAL
  Filled 2024-04-06 (×6): qty 1

## 2024-04-06 MED ORDER — ACETAMINOPHEN 650 MG RE SUPP: 650.0000 mg | Freq: Four times a day (QID) | RECTAL | Status: DC | PRN

## 2024-04-06 MED ORDER — LORAZEPAM 2 MG/ML IJ SOLN: 1.0000 mg | Freq: Once | INTRAMUSCULAR | Status: AC

## 2024-04-06 MED ORDER — LEVETIRACETAM (KEPPRA) 500 MG/5 ML ADULT IV PUSH
20.0000 mg/kg | Freq: Once | INTRAVENOUS | Status: AC
  Administered 2024-04-06: 1750 mg via INTRAVENOUS
  Filled 2024-04-06: qty 20

## 2024-04-06 MED ORDER — LEVETIRACETAM (KEPPRA) 500 MG/5 ML ADULT IV PUSH
500.0000 mg | INTRAVENOUS | Status: DC
  Administered 2024-04-08: 500 mg via INTRAVENOUS
  Filled 2024-04-06: qty 5

## 2024-04-06 MED ORDER — MEMANTINE HCL 10 MG PO TABS
10.0000 mg | ORAL_TABLET | Freq: Two times a day (BID) | ORAL | Status: DC
  Administered 2024-04-07 – 2024-04-13 (×12): 10 mg via ORAL
  Filled 2024-04-06 (×12): qty 1

## 2024-04-06 MED ORDER — LEVOTHYROXINE SODIUM 75 MCG PO TABS
175.0000 ug | ORAL_TABLET | Freq: Every day | ORAL | Status: DC
  Administered 2024-04-08 – 2024-04-13 (×6): 175 ug via ORAL
  Filled 2024-04-06 (×6): qty 1

## 2024-04-06 MED ORDER — LORAZEPAM 2 MG/ML IJ SOLN: 2.0000 mg | Freq: Once | INTRAMUSCULAR | Status: DC

## 2024-04-06 MED ORDER — HYDROCODONE-ACETAMINOPHEN 5-325 MG PO TABS
1.0000 | ORAL_TABLET | Freq: Four times a day (QID) | ORAL | Status: DC | PRN
  Administered 2024-04-07 – 2024-04-09 (×3): 1 via ORAL
  Filled 2024-04-06 (×3): qty 1

## 2024-04-06 MED ORDER — IOHEXOL 350 MG/ML SOLN
100.0000 mL | Freq: Once | INTRAVENOUS | Status: AC | PRN
  Administered 2024-04-06: 100 mL via INTRAVENOUS

## 2024-04-06 MED ORDER — LEVETIRACETAM 500 MG PO TABS
500.0000 mg | ORAL_TABLET | Freq: Every day | ORAL | Status: DC
  Administered 2024-04-08 – 2024-04-13 (×6): 500 mg via ORAL
  Filled 2024-04-06 (×6): qty 1

## 2024-04-06 MED ORDER — ONDANSETRON HCL 4 MG PO TABS: 4.0000 mg | ORAL_TABLET | Freq: Four times a day (QID) | ORAL | Status: DC | PRN

## 2024-04-06 MED ORDER — ONDANSETRON HCL 4 MG/2ML IJ SOLN: 4.0000 mg | Freq: Four times a day (QID) | INTRAMUSCULAR | Status: DC | PRN

## 2024-04-06 MED ORDER — SEVELAMER CARBONATE 0.8 G PO PACK
0.8000 g | PACK | Freq: Three times a day (TID) | ORAL | Status: DC
  Filled 2024-04-06 (×5): qty 1

## 2024-04-06 MED ORDER — LORAZEPAM 2 MG/ML IJ SOLN
INTRAMUSCULAR | Status: AC
  Administered 2024-04-06: 1 mg via INTRAVENOUS
  Filled 2024-04-06: qty 1

## 2024-04-06 MED ORDER — LORAZEPAM 2 MG/ML IJ SOLN
INTRAMUSCULAR | Status: AC
  Administered 2024-04-06: 1 mg via INTRAMUSCULAR
  Filled 2024-04-06: qty 1

## 2024-04-06 MED ORDER — DULOXETINE HCL 60 MG PO CPEP
60.0000 mg | ORAL_CAPSULE | Freq: Every day | ORAL | Status: DC
Start: 2024-04-06 — End: 2024-04-13
  Administered 2024-04-08 – 2024-04-13 (×6): 60 mg via ORAL
  Filled 2024-04-06 (×6): qty 1

## 2024-04-06 MED ORDER — CARVEDILOL 6.25 MG PO TABS
6.2500 mg | ORAL_TABLET | Freq: Two times a day (BID) | ORAL | Status: DC
  Administered 2024-04-07 – 2024-04-13 (×12): 6.25 mg via ORAL
  Filled 2024-04-06 (×12): qty 1

## 2024-04-06 MED ORDER — ACETAMINOPHEN 325 MG PO TABS
650.0000 mg | ORAL_TABLET | Freq: Four times a day (QID) | ORAL | Status: DC | PRN
  Administered 2024-04-10: 650 mg via ORAL
  Filled 2024-04-06: qty 2

## 2024-04-06 NOTE — Consult Note (Signed)
 Urology Consult   Reason for consult: hematuria  History of Present Illness: Jennifer [name] is a 68 y.o. female with past medical history significant for ESRD, CAD, history of stroke, dementia, diabetes.  She is brought to the emergency department for mental status changes.  She was recently treated for urinary tract infection (ucx positive for klebsiella 10/10) and developed hematuria.  Patient is unable to provide history and family not at bedside but appears has been having blood in her urine with clots for around the last week.  She had a CT scan October 22 which showed a lobulated hyperdensity in the bladder measuring around 10 cm.  I had the emergency department repeat a CT hematuria and appears this is more consistent with clot; additionally there has been interval improvement in the clot burden since then.    Past Medical History:  Diagnosis Date   CAD (coronary artery disease)    cath 5/23 100% dist RCA lesion treated with 2 overlapping Integrity Resolute DES ( 2.25x59mm, 2.25x29mm), 60% mid RCA treated medically, 99% OM2 not amenable to PCI, 70% D1 lesion, EF normal   Hypercholesteremia    Hypertension    Hypothyroidism    Kidney disease    Myocardial infarction [facility])    Renal disorder    Stroke (HCC) 10/2018   Thyroid  disease    Type 2 diabetes mellitus (HCC) 10/08/2018    Past Surgical History:  Procedure Laterality Date   ABDOMINAL HYSTERECTOMY     AV FISTULA PLACEMENT Right 06/28/2022   Procedure: RIGHT ARM ARTERIOVENOUS (AV) FISTULA CREATION;  Surgeon: Oris Krystal FALCON, MD;  Location: [facility];  Service: Vascular;  Laterality: Right;   CARDIAC CATHETERIZATION N/A 11/03/2015   Procedure: Left Heart Cath and Coronary Angiography;  Surgeon: Alm LELON Clay, MD;  Location: [facility] INVASIVE CV LAB;  Service: Cardiovascular;  Laterality: N/A;   CARDIAC CATHETERIZATION N/A 11/03/2015   Procedure: Coronary Stent Intervention;  Surgeon: Alm LELON Clay, MD;  Location: [facility] INVASIVE CV LAB;   Service: Cardiovascular;  Laterality: N/A;   CARPAL TUNNEL RELEASE     CORONARY ANGIOPLASTY     1 stent   FISTULA SUPERFICIALIZATION Right 08/16/2022   Procedure: RIGHT ARTERIOVENOUS FISTULA SUPERFICIALIZATION;  Surgeon: Oris Krystal FALCON, MD;  Location: [facility];  Service: Vascular;  Laterality: Right;   TOTAL KNEE ARTHROPLASTY Right 04/02/2019   Procedure: TOTAL KNEE ARTHROPLASTY;  Surgeon: Margrette Taft BRAVO, MD;  Location: [facility];  Service: Orthopedics;  Laterality: Right;     Current Hospital Medications:  Home meds:  No current facility-administered medications on file prior to encounter.   Current Outpatient Medications on File Prior to Encounter  Medication Sig Dispense Refill   HYDROcodone -acetaminophen  (NORCO/VICODIN) 5-325 MG tablet Take 1 tablet by mouth every 6 (six) hours as needed for up to 3 days for moderate pain (pain score 4-6). 10 tablet 0   acetaminophen  (TYLENOL ) 500 MG tablet Take 1,000 mg by mouth every 6 (six) hours as needed for mild pain (pain score 1-3).     amLODipine  (NORVASC ) 5 MG tablet Take 1 tablet by mouth daily.     apixaban  (ELIQUIS ) 5 MG TABS tablet Take 1 tablet (5 mg total) by mouth 2 (two) times daily. 180 tablet 1   aspirin  81 MG chewable tablet Chew 1 tablet (81 mg total) by mouth daily. 30 tablet 2   atorvastatin  (LIPITOR ) 80 MG tablet TAKE 1 TABLET BY MOUTH ONCE DAILY AT  6  IN  THE  EVENING (Patient taking differently:  Take 80 mg by mouth every evening. TAKE 1 TABLET BY MOUTH ONCE DAILY AT  6  IN  THE  EVENING) 90 tablet 3   buPROPion  (WELLBUTRIN  SR) 150 MG 12 hr tablet Take 1 tablet (150 mg total) by mouth 2 (two) times daily. 180 tablet 3   carvedilol  (COREG ) 12.5 MG tablet Take 6.25 mg by mouth 2 (two) times daily with a meal.     cephALEXin  (KEFLEX ) 500 MG capsule Take 1 capsule (500 mg total) by mouth 4 (four) times daily for 7 days. 28 capsule 0   DULoxetine  (CYMBALTA ) 60 MG capsule Take 1 capsule (60 mg total) by mouth daily. 90 capsule 3    iron sucrose 100 mg in sodium chloride  0.9 % 100 mL Inject 100 mg into the vein every dialysis (low iron).     levothyroxine  (SYNTHROID ) 175 MCG tablet Take 175 mcg by mouth daily before breakfast.     lidocaine -prilocaine  (EMLA ) cream Apply 1 Application topically daily as needed (HD).     memantine  (NAMENDA ) 10 MG tablet Take 1 tablet (10 mg total) by mouth 2 (two) times daily. 60 tablet 11   Methoxy PEG-Epoetin Beta (MIRCERA) 200 MCG/0.3ML SOSY 200 mcg by Implant route daily as needed (low RBC at dialysis). Mircera     nitroGLYCERIN  (NITROSTAT ) 0.4 MG SL tablet Place 0.4 mg under the tongue every 5 (five) minutes x 3 doses as needed for chest pain.     OVER THE COUNTER MEDICATION Apply 1 application  topically See admin instructions. Zinc barrier cream- apply to buttocks with each incontinence episode.     Potassium Chloride  ER 20 MEQ TBCR Take 1 tablet by mouth daily.     potassium chloride  SA (KLOR-CON  M) 20 MEQ tablet Take 1 tablet (20 mEq total) by mouth once for 1 dose. (Patient not taking: Reported on 04/03/2024)     sevelamer  carbonate (RENVELA ) 0.8 g PACK packet Take 0.8 g by mouth 3 (three) times daily with meals.     sulfamethoxazole-trimethoprim (BACTRIM DS) 800-160 MG tablet Take 1 tablet by mouth daily. (Patient not taking: Reported on 04/03/2024) 7 tablet 0     Scheduled Meds:  [START ON 04/07/2024] levETIRAcetam  500 mg Oral Daily   Continuous Infusions: PRN Meds:.  Allergies:  Allergies  Allergen Reactions   Tnkase  [Tenecteplase ] Other (See Comments)    Brain bleed   Glucophage [Metformin] Diarrhea    Family History  Problem Relation Age of Onset   Heart disease Mother    Stroke Sister    Heart attack Brother    Breast cancer Neg Hx     Social History:  reports that she quit smoking about 2 years ago. Her smoking use included cigarettes. She has never used smokeless tobacco. She reports that she does not drink alcohol  and does not use drugs.  ROS: A complete  review of systems was performed.  All systems are negative except for pertinent findings as noted.  Physical Exam:  Vital signs in last 24 hours: Temp:  [97.5 F (36.4 C)-97.6 F (36.4 C)] 97.6 F (36.4 C) (10/25 1557) Pulse Rate:  [66-73] 70 (10/25 1545) Resp:  [12-16] 14 (10/25 1545) BP: (152-171)/(57-71) 166/63 (10/25 1545) SpO2:  [69 %-98 %] 73 % (10/25 1545) Weight:  [82.5 kg-85.3 kg] 82.5 kg (10/25 1259) Constitutional:  Alert and oriented, No acute distress Cardiovascular: Regular rate and rhythm Respiratory: Normal respiratory effort, Lungs clear bilaterally GI: Abdomen is soft, nontender, nondistended, no abdominal masses Neurologic: Grossly intact, no  focal deficits Psychiatric: Normal mood and affect  Laboratory Data:  Recent Labs    04/03/24 1753 04/06/24 1251  WBC 6.6 6.5  HGB 9.6* 9.8*  HCT 30.9* 31.0*  PLT 233 249    Recent Labs    04/03/24 1753 04/06/24 1251  NA 137 138  K 3.2* 3.8  CL 95* 94*  GLUCOSE 98 157*  BUN 18 23  CALCIUM  9.1 9.1  CREATININE 4.42* 4.53*     Results for orders placed or performed during the hospital encounter of 04/06/24 (from the past 24 hours)  Protime-INR     Status: Abnormal   Collection Time: 04/06/24 12:51 PM  Result Value Ref Range   Prothrombin Time 15.8 (H) 11.4 - 15.2 seconds   INR 1.2 0.8 - 1.2  APTT     Status: Abnormal   Collection Time: 04/06/24 12:51 PM  Result Value Ref Range   aPTT 82 (H) 24 - 36 seconds  CBC     Status: Abnormal   Collection Time: 04/06/24 12:51 PM  Result Value Ref Range   WBC 6.5 4.0 - 10.5 K/uL   RBC 3.37 (L) 3.87 - 5.11 MIL/uL   Hemoglobin 9.8 (L) 12.0 - 15.0 g/dL   HCT 68.9 (L) 63.9 - 53.9 %   MCV 92.0 80.0 - 100.0 fL   MCH 29.1 26.0 - 34.0 pg   MCHC 31.6 30.0 - 36.0 g/dL   RDW 85.1 88.4 - 84.4 %   Platelets 249 150 - 400 K/uL   nRBC 0.0 0.0 - 0.2 %  Differential     Status: None   Collection Time: 04/06/24 12:51 PM  Result Value Ref Range   Neutrophils Relative % 79  %   Neutro Abs 5.2 1.7 - 7.7 K/uL   Lymphocytes Relative 13 %   Lymphs Abs 0.8 0.7 - 4.0 K/uL   Monocytes Relative 5 %   Monocytes Absolute 0.3 0.1 - 1.0 K/uL   Eosinophils Relative 1 %   Eosinophils Absolute 0.1 0.0 - 0.5 K/uL   Basophils Relative 1 %   Basophils Absolute 0.0 0.0 - 0.1 K/uL   Immature Granulocytes 1 %   Abs Immature Granulocytes 0.05 0.00 - 0.07 K/uL  Comprehensive metabolic panel     Status: Abnormal   Collection Time: 04/06/24 12:51 PM  Result Value Ref Range   Sodium 138 135 - 145 mmol/L   Potassium 3.8 3.5 - 5.1 mmol/L   Chloride 94 (L) 98 - 111 mmol/L   CO2 25 22 - 32 mmol/L   Glucose, Bld 157 (H) 70 - 99 mg/dL   BUN 23 8 - 23 mg/dL   Creatinine, Ser 5.46 (H) 0.44 - 1.00 mg/dL   Calcium  9.1 8.9 - 10.3 mg/dL   Total Protein 6.3 (L) 6.5 - 8.1 g/dL   Albumin  3.8 3.5 - 5.0 g/dL   AST 18 15 - 41 U/L   ALT 6 0 - 44 U/L   Alkaline Phosphatase 100 38 - 126 U/L   Total Bilirubin 0.4 0.0 - 1.2 mg/dL   GFR, Estimated 10 (L) >60 mL/min   Anion gap 18 (H) 5 - 15  Ethanol     Status: None   Collection Time: 04/06/24 12:51 PM  Result Value Ref Range   Alcohol , Ethyl (B) <15 <15 mg/dL   Recent Results (from the past 240 hours)  Resp panel by RT-PCR (RSV, Flu A&B, Covid) Anterior Nasal Swab     Status: None   Collection Time: 04/03/24  5:24 PM   Specimen: Anterior Nasal Swab  Result Value Ref Range Status   SARS Coronavirus 2 by RT PCR NEGATIVE NEGATIVE Final    Comment: (NOTE) SARS-CoV-2 target nucleic acids are NOT DETECTED.  The SARS-CoV-2 RNA is generally detectable in upper respiratory specimens during the acute phase of infection. The lowest concentration of SARS-CoV-2 viral copies this assay can detect is 138 copies/mL. A negative result does not preclude SARS-Cov-2 infection and should not be used as the sole basis for treatment or other patient management decisions. A negative result may occur with  improper specimen collection/handling, submission of  specimen other than nasopharyngeal swab, presence of viral mutation(s) within the areas targeted by this assay, and inadequate number of viral copies(<138 copies/mL). A negative result must be combined with clinical observations, patient history, and epidemiological information. The expected result is Negative.  Fact Sheet for Patients:  bloggercourse.com  Fact Sheet for Healthcare Providers:  seriousbroker.it  This test is no t yet approved or cleared by the [place]  FDA and  has been authorized for detection and/or diagnosis of SARS-CoV-2 by FDA under an Emergency Use Authorization (EUA). This EUA will remain  in effect (meaning this test can be used) for the duration of the COVID-19 declaration under Section 564(b)(1) of the Act, 21 U.S.C.section 360bbb-3(b)(1), unless the authorization is terminated  or revoked sooner.       Influenza A by PCR NEGATIVE NEGATIVE Final   Influenza B by PCR NEGATIVE NEGATIVE Final    Comment: (NOTE) The Xpert Xpress SARS-CoV-2/FLU/RSV plus assay is intended as an aid in the diagnosis of influenza from Nasopharyngeal swab specimens and should not be used as a sole basis for treatment. Nasal washings and aspirates are unacceptable for Xpert Xpress SARS-CoV-2/FLU/RSV testing.  Fact Sheet for Patients: bloggercourse.com  Fact Sheet for Healthcare Providers: seriousbroker.it  This test is not yet approved or cleared by the [place]  FDA and has been authorized for detection and/or diagnosis of SARS-CoV-2 by FDA under an Emergency Use Authorization (EUA). This EUA will remain in effect (meaning this test can be used) for the duration of the COVID-19 declaration under Section 564(b)(1) of the Act, 21 U.S.C. section 360bbb-3(b)(1), unless the authorization is terminated or revoked.     Resp Syncytial Virus by PCR NEGATIVE NEGATIVE Final     Comment: (NOTE) Fact Sheet for Patients: bloggercourse.com  Fact Sheet for Healthcare Providers: seriousbroker.it  This test is not yet approved or cleared by the [place]  FDA and has been authorized for detection and/or diagnosis of SARS-CoV-2 by FDA under an Emergency Use Authorization (EUA). This EUA will remain in effect (meaning this test can be used) for the duration of the COVID-19 declaration under Section 564(b)(1) of the Act, 21 U.S.C. section 360bbb-3(b)(1), unless the authorization is terminated or revoked.  Performed at [facility], [address] [place], [place] [address]     Renal Function: Recent Labs    04/03/24 1753 04/06/24 1251  CREATININE 4.42* 4.53*   Estimated Creatinine Clearance: 13.1 mL/min (A) (by C-G formula based on SCr of 4.53 mg/dL (H)).  Radiologic Imaging: CT HEAD CODE STROKE WO CONTRAST (LKW 0-4.5h, LVO 0-24h) Result Date: 04/06/2024 EXAM: CT HEAD WITHOUT 04/06/2024 12:33:24 PM TECHNIQUE: CT of the head was performed without the administration of intravenous contrast. Automated exposure control, iterative reconstruction, and/or weight based adjustment of the mA/kV was utilized to reduce the radiation dose to as low as reasonably achievable. COMPARISON: CT head without contrast 01/24/2024. MR head  without contrast 11/27/2023. CLINICAL HISTORY: FINDINGS: BRAIN AND VENTRICLES: No acute intracranial hemorrhage. No mass effect or midline shift. No extra-axial fluid collection. No evidence of acute infarct. Remote infarcts again noted in the cerebellum bilaterally. Remote lacunar infarcts are present in the right basal ganglia and corona radiata without significant interval change. Remote lacunar infarcts are stable in both thalami. Periventricular and subcortical white matter hypoattenuation is moderately advanced for age, stable. Atherosclerotic calcifications are present in the cavernous carotid  arteries bilaterally. No hyperdense vessel is present. No hydrocephalus. ORBITS: Bilateral lens replacements are noted. The globes and orbits are otherwise within normal limits. SINUSES AND MASTOIDS: No acute abnormality. SOFT TISSUES AND SKULL: No acute skull fracture. No acute soft tissue abnormality. IMPRESSION: 1. No acute intracranial abnormality. 2. Stable remote infarcts in the cerebellum bilaterally, right basal ganglia, and corona radiata. 3. Stable lacunar infarcts in both thalami. 4. Moderately advanced periventricular and subcortical white matter hypoattenuation, stable. 5. Atherosclerotic calcifications in the cavernous carotid arteries bilaterally. No hyperdense vessel present. Electronically signed by: Lonni Necessary MD 04/06/2024 12:39 PM EDT RP Workstation: [id number]    I independently reviewed the above imaging studies.  Impression/Recommendation: 68 year old female with multiple significant medical comorbidities who initially presented to the emergency department with altered mental status also with hematuria.  As mentioned above CT scan does show interval improvement although clot still present in bladder.  I recommend placement of a large bore (around 52 French) Foley catheter and initiating CBI.  Okay to hand irrigate as needed  Herlene Foot 04/06/2024, 4:23 PM  [facility]  Pager: [phone]

## 2024-04-06 NOTE — H&P (Addendum)
 History and Physical    Jennifer [name] FMW:[id number] DOB: [birth year] DOA: 04/06/2024  I have briefly reviewed the patient's prior medical records in [facility]  PCP: [name], Laine LABOR, MD  Patient coming from: home  Chief Complaint: mental status changes  HPI: Jennifer [name] is a 68 y.o. 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  Per family, she all of a sudden became poorly responsive, staring in space towards her right side, and was unable to talk.  She initially presented to [facility] emergency room, teleneurology was consulted and apparently was given IM Ativan  following which patient was improved, raising suspicion for seizures.  She was brought to [facility] ED and neurology was consulted.  Family denies any fever or chills recently, no other issues prior to this happening other than recent UTI which was treated with Bactrim, and then she had pretty significant hematuria, and antibiotics were changed to Keflex .  Daughters report that she has been having significant hematuria and passing blood clots in her urine for the past 6 days.  They came to the ED on 10/23.  They also mention that she hit her right shin against her wheelchair a few days ago, she had a pretty big blood-filled bullae which is now turning dark.  They did not notice any significant surrounding erythema, no fever or chills  ED Course: In the ED she is afebrile, blood pressure on the high side in the 160s-170s, satting well on room air.  Blood work reveals a hemoglobin of 9.8, creatinine of 4.5.  EKG shows sinus rhythm.  Neurology consulted and we are asked to admit  Review of Systems: All systems reviewed,  and apart from HPI, all negative  Past Medical History:  Diagnosis Date   CAD (coronary artery disease)    cath 5/23 100% dist RCA lesion treated with 2 overlapping Integrity Resolute DES ( 2.25x71mm, 2.25x36mm), 60% mid RCA treated medically, 99% OM2 not amenable to PCI, 70% D1 lesion, EF normal   Hypercholesteremia    Hypertension    Hypothyroidism    Kidney disease    Myocardial infarction [facility])    Renal disorder    Stroke (HCC) 10/2018   Thyroid  disease    Type 2 diabetes mellitus (HCC) 10/08/2018    Past Surgical History:  Procedure Laterality Date   ABDOMINAL HYSTERECTOMY     AV FISTULA PLACEMENT Right 06/28/2022   Procedure: RIGHT ARM ARTERIOVENOUS (AV) FISTULA CREATION;  Surgeon: Oris Krystal FALCON, MD;  Location: [facility];  Service: Vascular;  Laterality: Right;   CARDIAC CATHETERIZATION N/A 11/03/2015   Procedure: Left Heart Cath and Coronary Angiography;  Surgeon: Alm LELON Clay, MD;  Location: [facility] INVASIVE CV LAB;  Service: Cardiovascular;  Laterality: N/A;   CARDIAC CATHETERIZATION N/A 11/03/2015   Procedure: Coronary Stent Intervention;  Surgeon: Alm LELON Clay, MD;  Location: [facility] INVASIVE CV LAB;  Service: Cardiovascular;  Laterality: N/A;   CARPAL TUNNEL RELEASE     CORONARY ANGIOPLASTY     1 stent   FISTULA SUPERFICIALIZATION Right 08/16/2022   Procedure: RIGHT ARTERIOVENOUS FISTULA SUPERFICIALIZATION;  Surgeon: Oris Krystal FALCON, MD;  Location: [facility];  Service: Vascular;  Laterality: Right;   TOTAL  KNEE ARTHROPLASTY Right 04/02/2019   Procedure: TOTAL KNEE ARTHROPLASTY;  Surgeon: Margrette Taft BRAVO, MD;  Location: [facility];  Service: Orthopedics;  Laterality: Right;     reports that she quit smoking about 2 years ago. Her smoking use included cigarettes. She has never used smokeless tobacco. She reports that she does not drink alcohol  and does not use drugs.  Allergies  Allergen Reactions   Tnkase  [Tenecteplase ] Other (See Comments)    Brain bleed   Glucophage [Metformin]  Diarrhea    Family History  Problem Relation Age of Onset   Heart disease Mother    Stroke Sister    Heart attack Brother    Breast cancer Neg Hx     Prior to Admission medications   Medication Sig Start Date End Date Taking? Authorizing Provider  HYDROcodone -acetaminophen  (NORCO/VICODIN) 5-325 MG tablet Take 1 tablet by mouth every 6 (six) hours as needed for up to 3 days for moderate pain (pain score 4-6). 04/03/24 04/06/24 Yes Kammerer, Megan L, DO  acetaminophen  (TYLENOL ) 500 MG tablet Take 1,000 mg by mouth every 6 (six) hours as needed for mild pain (pain score 1-3).    [provider]  amLODipine  (NORVASC ) 5 MG tablet Take 1 tablet by mouth daily. 03/27/24   [provider]  apixaban  (ELIQUIS ) 5 MG TABS tablet Take 1 tablet (5 mg total) by mouth 2 (two) times daily. 01/11/24   [name], Laine LABOR, MD  aspirin  81 MG chewable tablet Chew 1 tablet (81 mg total) by mouth daily. 05/24/23   de Clint Kill, Cortney E, NP  atorvastatin  (LIPITOR ) 80 MG tablet TAKE 1 TABLET BY MOUTH ONCE DAILY AT  6  IN  THE  EVENING Patient taking differently: Take 80 mg by mouth every evening. TAKE 1 TABLET BY MOUTH ONCE DAILY AT  6  IN  THE  EVENING 09/06/23   Mallipeddi, Vishnu P, MD  buPROPion  (WELLBUTRIN  SR) 150 MG 12 hr tablet Take 1 tablet (150 mg total) by mouth 2 (two) times daily. 12/26/23   [name], Laine LABOR, MD  carvedilol  (COREG ) 12.5 MG tablet Take 6.25 mg by mouth 2 (two) times daily with a meal.    [provider]  cephALEXin  (KEFLEX ) 500 MG capsule Take 1 capsule (500 mg total) by mouth 4 (four) times daily for 7 days. 04/03/24 04/10/24  Kammerer, Megan L, DO  DULoxetine  (CYMBALTA ) 60 MG capsule Take 1 capsule (60 mg total) by mouth daily. 12/26/23   [name], Laine LABOR, MD  iron sucrose 100 mg in sodium chloride  0.9 % 100 mL Inject 100 mg into the vein every dialysis (low iron).    [provider]  levothyroxine  (SYNTHROID ) 175 MCG tablet Take 175 mcg by mouth daily before  breakfast.    [provider]  lidocaine -prilocaine  (EMLA ) cream Apply 1 Application topically daily as needed (HD). 01/10/24   [provider]  memantine  (NAMENDA ) 10 MG tablet Take 1 tablet (10 mg total) by mouth 2 (two) times daily. 12/27/23   Whitfield Raisin, NP  Methoxy PEG-Epoetin Beta (MIRCERA) 200 MCG/0.3ML SOSY 200 mcg by Implant route daily as needed (low RBC at dialysis). Mircera    [provider]  nitroGLYCERIN  (NITROSTAT ) 0.4 MG SL tablet Place 0.4 mg under the tongue every 5 (five) minutes x 3 doses as needed for chest pain.    [provider]  OVER THE COUNTER MEDICATION Apply 1 application  topically See admin instructions. Zinc barrier cream- apply to buttocks with each incontinence episode.  [provider]  Potassium Chloride  ER 20 MEQ TBCR Take 1 tablet by mouth daily. 02/01/24   [provider]  potassium chloride  SA (KLOR-CON  M) 20 MEQ tablet Take 1 tablet (20 mEq total) by mouth once for 1 dose. Patient not taking: Reported on 04/03/2024 03/01/24 03/12/24  Strader, Brittany M, PA-C  sevelamer  carbonate (RENVELA ) 0.8 g PACK packet Take 0.8 g by mouth 3 (three) times daily with meals.    [provider]  sulfamethoxazole-trimethoprim (BACTRIM DS) 800-160 MG tablet Take 1 tablet by mouth daily. Patient not taking: Reported on 04/03/2024 03/26/24   Randeen Laine LABOR, MD    Physical Exam: Vitals:   04/06/24 1515 04/06/24 1530 04/06/24 1545 04/06/24 1557  BP: (!) 152/64 (!) 155/71 (!) 166/63   Pulse:  73 70   Resp: 12 16 14    Temp:    97.6 F (36.4 C)  TempSrc:    Oral  SpO2:  (!) 69% (!) 73%   Weight:      Height:        Constitutional: She is in no distress, appears confused Eyes: PERRL, lids and conjunctivae normal ENMT: Mucous membranes are dry Neck: normal, supple Respiratory: clear to auscultation bilaterally, no wheezing, no crackles. Cardiovascular: Regular rate and rhythm, no murmurs / rubs / gallops.   Trace edema Abdomen: no tenderness, no masses palpated. Bowel sounds positive.  Musculoskeletal: no clubbing / cyanosis. Normal muscle tone.  Skin: Large, 2 x 2 inches round necrotic patch of raised skin on the right shin.  No surrounding erythema Neurologic: Strength is equal although slow to follow commands and with generalized weakness Psychiatric: Alert to self  Labs on Admission: I have personally reviewed following labs and imaging studies  CBC: Recent Labs  Lab 04/03/24 1753 04/06/24 1251  WBC 6.6 6.5  NEUTROABS  --  5.2  HGB 9.6* 9.8*  HCT 30.9* 31.0*  MCV 90.4 92.0  PLT 233 249   Basic Metabolic Panel: Recent Labs  Lab 04/03/24 1753 04/06/24 1251  NA 137 138  K 3.2* 3.8  CL 95* 94*  CO2 30 25  GLUCOSE 98 157*  BUN 18 23  CREATININE 4.42* 4.53*  CALCIUM  9.1 9.1   Liver Function Tests: Recent Labs  Lab 04/03/24 1753 04/06/24 1251  AST 17 18  ALT 6 6  ALKPHOS 83 100  BILITOT 0.4 0.4  PROT 6.0* 6.3*  ALBUMIN  3.7 3.8   Coagulation Profile: Recent Labs  Lab 04/06/24 1251  INR 1.2   BNP (last 3 results) No results for input(s): PROBNP in the last 8760 hours. CBG: No results for input(s): GLUCAP in the last 168 hours. Thyroid  Function Tests: No results for input(s): TSH, T4TOTAL, FREET4, T3FREE, THYROIDAB in the last 72 hours. Urine analysis:    Component Value Date/Time   COLORURINE RED (A) 04/03/2024 1809   APPEARANCEUR CLOUDY (A) 04/03/2024 1809   APPEARANCEUR Turbid (A) 03/22/2024 1100   LABSPEC  04/03/2024 1809    TEST NOT REPORTED DUE TO COLOR INTERFERENCE OF URINE PIGMENT   PHURINE  04/03/2024 1809    TEST NOT REPORTED DUE TO COLOR INTERFERENCE OF URINE PIGMENT   GLUCOSEU (A) 04/03/2024 1809    TEST NOT REPORTED DUE TO COLOR INTERFERENCE OF URINE PIGMENT   HGBUR (A) 04/03/2024 1809    TEST NOT REPORTED DUE TO COLOR INTERFERENCE OF URINE PIGMENT   BILIRUBINUR (A) 04/03/2024 1809    TEST NOT REPORTED DUE TO COLOR  INTERFERENCE OF URINE PIGMENT   BILIRUBINUR  Negative 03/22/2024 1100   KETONESUR (A) 04/03/2024 1809    TEST NOT REPORTED DUE TO COLOR INTERFERENCE OF URINE PIGMENT   PROTEINUR (A) 04/03/2024 1809    TEST NOT REPORTED DUE TO COLOR INTERFERENCE OF URINE PIGMENT   UROBILINOGEN 0.2 11/19/2021 1015   NITRITE (A) 04/03/2024 1809    TEST NOT REPORTED DUE TO COLOR INTERFERENCE OF URINE PIGMENT   LEUKOCYTESUR (A) 04/03/2024 1809    TEST NOT REPORTED DUE TO COLOR INTERFERENCE OF URINE PIGMENT     Radiological Exams on Admission: CT HEAD CODE STROKE WO CONTRAST (LKW 0-4.5h, LVO 0-24h) Result Date: 04/06/2024 EXAM: CT HEAD WITHOUT 04/06/2024 12:33:24 PM TECHNIQUE: CT of the head was performed without the administration of intravenous contrast. Automated exposure control, iterative reconstruction, and/or weight based adjustment of the mA/kV was utilized to reduce the radiation dose to as low as reasonably achievable. COMPARISON: CT head without contrast 01/24/2024. MR head without contrast 11/27/2023. CLINICAL HISTORY: FINDINGS: BRAIN AND VENTRICLES: No acute intracranial hemorrhage. No mass effect or midline shift. No extra-axial fluid collection. No evidence of acute infarct. Remote infarcts again noted in the cerebellum bilaterally. Remote lacunar infarcts are present in the right basal ganglia and corona radiata without significant interval change. Remote lacunar infarcts are stable in both thalami. Periventricular and subcortical white matter hypoattenuation is moderately advanced for age, stable. Atherosclerotic calcifications are present in the cavernous carotid arteries bilaterally. No hyperdense vessel is present. No hydrocephalus. ORBITS: Bilateral lens replacements are noted. The globes and orbits are otherwise within normal limits. SINUSES AND MASTOIDS: No acute abnormality. SOFT TISSUES AND SKULL: No acute skull fracture. No acute soft tissue abnormality. IMPRESSION: 1. No acute intracranial  abnormality. 2. Stable remote infarcts in the cerebellum bilaterally, right basal ganglia, and corona radiata. 3. Stable lacunar infarcts in both thalami. 4. Moderately advanced periventricular and subcortical white matter hypoattenuation, stable. 5. Atherosclerotic calcifications in the cavernous carotid arteries bilaterally. No hyperdense vessel present. Electronically signed by: Lonni Necessary MD 04/06/2024 12:39 PM EDT RP Workstation: [id number]    EKG: Independently reviewed. Sinus on telemetry   Assessment/Plan Principal problem Mental status changes, acute metabolic encephalopathy -etiology not entirely clear, CVA versus seizures.  Given response to Ativan , latter seems to be more plausible - Neurology consulted, appreciate input.  She was started on Keppra.  Continue seizure precautions  Active problems Hemorrhagic cystitis - patient was diagnosed with a UTI, has completed Bactrim and currently is on Keflex  for persistent hematuria.  She has also been passing a significant amount of blood clots.  CT scan of the abdomen and pelvis done 3 days ago on her ED visit showed a 10.3 x 4.9 hyperdense mass versus hematoma/hemorrhagic material in the bladder.  Urology consulted as she is having persistent symptoms, appreciate input - Obtain urinalysis, obtain CT scan as per urology  ESRD -nephrology consulted, appreciate input.  No acute HD needs currently  History of PE-Eliquis  is on hold due to persistent hematuria  Vascular dementia -she has good days and bad days per patient's daughters.  Resume home meds  Essential hypertension -resume home meds  Hypothyroidism-resume Synthroid   Depression-continue home medications except Wellbutrin  which will be held per neurology  Hyperlipidemia-resume statin  Right shin wound-monitor, does not appear infected   DVT prophylaxis: SCDs  Code Status: DNR (Most form present)  Family Communication: 2 daughters in the room Bed Type: Medical  telemetry  Consults called: Urology Valli), Nephrology Jil), neurology [place]) Obs/Inp: Obs  At the time of admission, it appears  that the appropriate admission status for this patient is INPATIENT as it is expected that patient will require hospital care > 2 midnights. This is judged to be reasonable and necessary in order to provide the required intensity of service to ensure the patient's safety given: presenting symptoms, initial radiographic and laboratory data and in the context of their chronic comorbidities. Together, these circumstances are felt to place patient at high at high risk for further clinical deterioration threatening life, limb, or organ.  Nilda Fendt, MD, PhD [facility]  Contact via [email or web address]  04/06/2024, 4:14 PM

## 2024-04-06 NOTE — ED Triage Notes (Signed)
 Pt BIB EMS CODE STROKE. Per EMS pt does at home hemodialysis had not been in treatment long and started having rt side gaze and aphasia. Per EMS pt seen for previous stroke before. EDP and Dr. Zollie at bedside during triage.

## 2024-04-06 NOTE — ED Provider Notes (Signed)
 [place] EMERGENCY DEPARTMENT AT [facility] Provider Note   CSN: [id number] Arrival date & time: 04/06/24  1221  An emergency department physician performed an initial assessment on this suspected stroke patient at 1220.  Patient presents with: Code Stroke   Jennifer [name] is a 68 y.o. female.   HPI Patient presents for strokelike symptoms.  Medical history includes HTN, HLD, CAD, DM, CVA, CHF, ESRD.  She was seen in the ED 3 days ago for abdominal pain.  On imaging, she was found to have hemorrhagic material in bladder.  This was discussed with urology who advised outpatient follow-up.  Her Bactrim was switched for Keflex .  Today, at 47 AM, family noticed that she had an acute change in mental status.  She became nonverbal and not responding.  EMS was called.  EMS initially a right sided gaze.  This resolved during transit.  Patient also noted to have a right facial droop which family reports is pre-existing.  Per chart review, she is followed by Dr. Rosemarie.  She was seen in neurology office in January for hallucinations and memory loss.  It was noted that she had a remote right basal ganglia infarct in 2019 with mild residual left hand weakness.  In December of last year, she presented to [facility] with strokelike symptoms.  This included aphasia and right sided weakness.  She was given TNKase .  She subsequently had hemorrhagic transformation of right pontine infarct.  TNKase  was reversed.  At time of office follow-up visit, she had improved physical deficits but worsened cognitive function.  She is prescribed Eliquis .  She was recently taken off of this due to hematuria and leg hematoma.    Prior to Admission medications   Medication Sig Start Date End Date Taking? Authorizing Provider  HYDROcodone -acetaminophen  (NORCO/VICODIN) 5-325 MG tablet Take 1 tablet by mouth every 6 (six) hours as needed for up to 3 days for moderate pain (pain score 4-6). 04/03/24 04/06/24 Yes Kammerer,  Megan L, DO  acetaminophen  (TYLENOL ) 500 MG tablet Take 1,000 mg by mouth every 6 (six) hours as needed for mild pain (pain score 1-3).    [provider]  amLODipine  (NORVASC ) 5 MG tablet Take 1 tablet by mouth daily. 03/27/24   [provider]  apixaban  (ELIQUIS ) 5 MG TABS tablet Take 1 tablet (5 mg total) by mouth 2 (two) times daily. 01/11/24   [name], Laine LABOR, MD  aspirin  81 MG chewable tablet Chew 1 tablet (81 mg total) by mouth daily. 05/24/23   de Clint Kill, Cortney E, NP  atorvastatin  (LIPITOR ) 80 MG tablet TAKE 1 TABLET BY MOUTH ONCE DAILY AT  6  IN  THE  EVENING Patient taking differently: Take 80 mg by mouth every evening. TAKE 1 TABLET BY MOUTH ONCE DAILY AT  6  IN  THE  EVENING 09/06/23   Mallipeddi, Vishnu P, MD  buPROPion  (WELLBUTRIN  SR) 150 MG 12 hr tablet Take 1 tablet (150 mg total) by mouth 2 (two) times daily. 12/26/23   [name], Laine LABOR, MD  carvedilol  (COREG ) 12.5 MG tablet Take 6.25 mg by mouth 2 (two) times daily with a meal.    [provider]  cephALEXin  (KEFLEX ) 500 MG capsule Take 1 capsule (500 mg total) by mouth 4 (four) times daily for 7 days. 04/03/24 04/10/24  Kammerer, Megan L, DO  DULoxetine  (CYMBALTA ) 60 MG capsule Take 1 capsule (60 mg total) by mouth daily. 12/26/23   [name], Laine LABOR, MD  iron sucrose  100 mg in sodium chloride  0.9 % 100 mL Inject 100 mg into the vein every dialysis (low iron).    [provider]  levothyroxine  (SYNTHROID ) 175 MCG tablet Take 175 mcg by mouth daily before breakfast.    [provider]  lidocaine -prilocaine  (EMLA ) cream Apply 1 Application topically daily as needed (HD). 01/10/24   [provider]  memantine  (NAMENDA ) 10 MG tablet Take 1 tablet (10 mg total) by mouth 2 (two) times daily. 12/27/23   Whitfield Raisin, NP  Methoxy PEG-Epoetin Beta (MIRCERA) 200 MCG/0.3ML SOSY 200 mcg by Implant route daily as needed (low RBC at dialysis). Mircera    [provider]  nitroGLYCERIN   (NITROSTAT ) 0.4 MG SL tablet Place 0.4 mg under the tongue every 5 (five) minutes x 3 doses as needed for chest pain.    [provider]  OVER THE COUNTER MEDICATION Apply 1 application  topically See admin instructions. Zinc barrier cream- apply to buttocks with each incontinence episode.    [provider]  Potassium Chloride  ER 20 MEQ TBCR Take 1 tablet by mouth daily. 02/01/24   [provider]  potassium chloride  SA (KLOR-CON  M) 20 MEQ tablet Take 1 tablet (20 mEq total) by mouth once for 1 dose. Patient not taking: Reported on 04/03/2024 03/01/24 03/12/24  Strader, Brittany M, PA-C  sevelamer  carbonate (RENVELA ) 0.8 g PACK packet Take 0.8 g by mouth 3 (three) times daily with meals.    [provider]  sulfamethoxazole-trimethoprim (BACTRIM DS) 800-160 MG tablet Take 1 tablet by mouth daily. Patient not taking: Reported on 04/03/2024 03/26/24   [name], Laine LABOR, MD    Allergies: Tnkase  [tenecteplase ] and Glucophage [metformin]    Review of Systems  Unable to perform ROS: Patient nonverbal    Updated Vital Signs BP (!) 160/69   Pulse 70   Temp (!) 97.5 F (36.4 C) (Axillary)   Resp 15   Ht 5' 7 (1.702 m)   Wt 82.5 kg   SpO2 95%   BMI 28.49 kg/m   Physical Exam Vitals and nursing note reviewed.  Constitutional:      General: She is not in acute distress.    Appearance: She is well-developed and normal weight. She is not toxic-appearing or diaphoretic.  HENT:     Head: Normocephalic and atraumatic.     Right Ear: External ear normal.     Left Ear: External ear normal.     Nose: Nose normal.  Eyes:     Conjunctiva/sclera: Conjunctivae normal.  Cardiovascular:     Rate and Rhythm: Normal rate and regular rhythm.  Pulmonary:     Effort: Pulmonary effort is normal. No respiratory distress.  Abdominal:     General: There is no distension.     Palpations: Abdomen is soft.  Musculoskeletal:        General: No swelling.     Cervical back:  Normal range of motion and neck supple.  Skin:    General: Skin is warm and dry.     Comments: Firm scab on medial aspect of distal right lower extremity.  Mild surrounding erythema.  No fluctuance.  Neurological:     Cranial Nerves: Facial asymmetry present.     Motor: Abnormal muscle tone present.     Comments: Right sided facial droop, aphasia, stiffened extremities, not following commands.     (all labs ordered are listed, but only abnormal results are displayed) Labs Reviewed  PROTIME-INR - Abnormal; Notable for the following components:  Result Value   Prothrombin Time 15.8 (*)    All other components within normal limits  APTT - Abnormal; Notable for the following components:   aPTT 82 (*)    All other components within normal limits  CBC - Abnormal; Notable for the following components:   RBC 3.37 (*)    Hemoglobin 9.8 (*)    HCT 31.0 (*)    All other components within normal limits  COMPREHENSIVE METABOLIC PANEL WITH GFR - Abnormal; Notable for the following components:   Chloride 94 (*)    Glucose, Bld 157 (*)    Creatinine, Ser 4.53 (*)    Total Protein 6.3 (*)    GFR, Estimated 10 (*)    Anion gap 18 (*)    All other components within normal limits  DIFFERENTIAL  ETHANOL  URINE DRUG SCREEN  CBG MONITORING, ED    EKG: None  Radiology: CT HEAD CODE STROKE WO CONTRAST (LKW 0-4.5h, LVO 0-24h) Result Date: 04/06/2024 EXAM: CT HEAD WITHOUT 04/06/2024 12:33:24 PM TECHNIQUE: CT of the head was performed without the administration of intravenous contrast. Automated exposure control, iterative reconstruction, and/or weight based adjustment of the mA/kV was utilized to reduce the radiation dose to as low as reasonably achievable. COMPARISON: CT head without contrast 01/24/2024. MR head without contrast 11/27/2023. CLINICAL HISTORY: FINDINGS: BRAIN AND VENTRICLES: No acute intracranial hemorrhage. No mass effect or midline shift. No extra-axial fluid collection. No  evidence of acute infarct. Remote infarcts again noted in the cerebellum bilaterally. Remote lacunar infarcts are present in the right basal ganglia and corona radiata without significant interval change. Remote lacunar infarcts are stable in both thalami. Periventricular and subcortical white matter hypoattenuation is moderately advanced for age, stable. Atherosclerotic calcifications are present in the cavernous carotid arteries bilaterally. No hyperdense vessel is present. No hydrocephalus. ORBITS: Bilateral lens replacements are noted. The globes and orbits are otherwise within normal limits. SINUSES AND MASTOIDS: No acute abnormality. SOFT TISSUES AND SKULL: No acute skull fracture. No acute soft tissue abnormality. IMPRESSION: 1. No acute intracranial abnormality. 2. Stable remote infarcts in the cerebellum bilaterally, right basal ganglia, and corona radiata. 3. Stable lacunar infarcts in both thalami. 4. Moderately advanced periventricular and subcortical white matter hypoattenuation, stable. 5. Atherosclerotic calcifications in the cavernous carotid arteries bilaterally. No hyperdense vessel present. Electronically signed by: Lonni Necessary MD 04/06/2024 12:39 PM EDT RP Workstation: [id number]     Procedures   Medications Ordered in the ED  LORazepam  (ATIVAN ) injection 1 mg (1 mg Intravenous Given 04/06/24 1234)  LORazepam  (ATIVAN ) injection 1 mg (1 mg Intramuscular Given 04/06/24 1240)                                    Medical Decision Making Amount and/or Complexity of Data Reviewed Labs: ordered. Radiology: ordered.  Risk Prescription drug management.   This patient presents to the ED for concern of strokelike symptoms, this involves an extensive number of treatment options, and is a complaint that carries with it a high risk of complications and morbidity.  The differential diagnosis includes CVA, ICH, seizure, metabolic derangements, polypharmacy   Co morbidities /  Chronic conditions that complicate the patient evaluation  HTN, HLD, CAD, DM, CVA, CHF, ESRD   Additional history obtained:  Additional history obtained from EMR External records from outside source obtained and reviewed including EMS   Lab Tests:  I Ordered, and personally interpreted labs.  The pertinent results include: Slight anemia, no leukocytosis   Imaging Studies ordered:  I ordered imaging studies including CT head I independently visualized and interpreted imaging which showed no acute findings I agree with the radiologist interpretation   Cardiac Monitoring: / EKG:  The patient was maintained on a cardiac monitor.  I personally viewed and interpreted the cardiac monitored which showed an underlying rhythm of: Sinus rhythm   Problem List / ED Course / Critical interventions / Medication management  Patient presenting for strokelike symptoms.  Acute change was noted by family at 50 AM.  On arrival, patient is aphasic.  She appears to have a right facial paralysis.  She has stiffening of all extremities.  Dose of Ativan  ordered for possible partial seizure.  Code stroke was initiated.  Physical exam also notable for what appears to be a large scab on the medial aspect of the distal right lower extremity.  On noncontrasted CT scan of head, no acute findings were identified.  I spoke with neurologist on-call, Dr. Matthews, who stated that patient was not a candidate for TNKase .  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  Additional Ativan  and Keppra to be ordered.  Patient will require ED to ED transfer to [facility] for continuous EEG.  Patient was accepted in transfer by Dr. Pamella. on reassessment, patient is now awake and alert.  She is able to have a conversation with me.  She is able to move all extremities.  When asked about her leg wound, she states that she struck it on a piece of furniture.  She does feel that the wound is improving.  She denies any recent  drainage.  Patient was transported for further management. I ordered medication including Ativan  for possible seizure Reevaluation of the patient after these medicines showed that the patient stayed the same I have reviewed the patients home medicines and have made adjustments as needed   Consultations Obtained:  I requested consultation with the neurologist, Dr. Matthews,  and discussed lab and imaging findings as well as pertinent plan - they recommend: ED to ED transfer to [facility] for continuous EEG and admission to neuro ICU versus stepdown unit   Social Determinants of Health:  Lives at home with family     Final diagnoses:  Stroke-like symptoms    ED Discharge Orders     None          Melvenia Motto, MD 04/06/24 [phone]

## 2024-04-06 NOTE — ED Notes (Signed)
 CBG 106

## 2024-04-06 NOTE — ED Provider Notes (Signed)
 3:14 PM I evaluated patient after she was transferred from [facility].  She is awake and alert.  She does not appear to have obvious seizure-like activity.  Neurology will be consulted.  3:34 PM Neuro is evaluating at the bedside.  They are going to give her an IV Keppra bolus and then started her on 500 mg daily due to her dialysis status.  They are canceling the continuous EEG as she is not appearing to have any seizure-like activity and is awake and alert.  Will need admission.  Hospitalist consulted.  Dr. Vianne will admit.   EKG Interpretation Date/Time:  Saturday April 06 2024 13:13:52 EDT Ventricular Rate:  68 PR Interval:  166 QRS Duration:  116 QT Interval:  496 QTC Calculation: 527 R Axis:   74  Text Interpretation: Normal sinus rhythm Nonspecific ST abnormality Prolonged QT Interpretation limited secondary to artifact Confirmed by Freddi Hamilton [phone]) on 04/06/2024 3:36:27 PM          Freddi Hamilton, MD 04/06/24 1549

## 2024-04-06 NOTE — ED Notes (Signed)
 Family at bedside, urine sample sent from pure wick noted to be red with blood

## 2024-04-06 NOTE — Consult Note (Signed)
 NEUROLOGY TELESTROKE CONSULT NOTE   Date of service: April 06, 2024 Patient Name: Jennifer [name] MRN:  [id number] DOB:  01/25/1956 Chief Complaint: acute R gaze deviation and decreased responsiveness Requesting Provider: Bernardino Fireman MD  Consult Participants: myself, patient, RN Location of the provider: [facility] Location of the patient: [facility]  This consult was provided via telemedicine with 2-way video and audio communication. The patient/family was informed that care would be provided in this way and agreed to receive care in this manner.   History of Present Illness   Jennifer [name] is a 68 y.o. 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 LKW 1100. Sx occurred while she was undergoing dialysis at home. No seizure history. TNK not administered 2/2 prior ICH. CTA not performed bc patient would not be candidate for thrombectomy even if LVO were found 2/2 poor functional status (bedbound at baseline with dementia). After administration of 2mg  IM ativan  patient improved and was able to have a conversation with EDP.   Neurology arrived on cart prior to patient arrival with EMS   LKW: 1100 Modified rankin score: 4-Needs assistance to walk and tend to bodily needs IV Thrombolysis: no, prior ICH            NIHSS components Score: Comment  1a Level of Conscious 0[]  1[x]  2[]  3[]         1b LOC Questions 0[]  1[]  2[x]           1c LOC Commands 0[]  1[]  2[x]           2 Best Gaze 0[]  1[]  2[x]           3 Visual 0[x]  1[]  2[]  3[]         4 Facial Palsy 0[x]  1[]  2[]  3[]         5a Motor Arm - left 0[]  1[]  2[]  3[]  4[x]  UN[]     5b Motor Arm - Right 0[]  1[]  2[]  3[]  4[x]  UN[]     6a Motor Leg - Left 0[]  1[]  2[]  3[x]  4[]  UN[]     6b Motor Leg - Right 0[]  1[]  2[]  3[x]  4[]  UN[]     7 Limb Ataxia 0[x]  1[]  2[]  UN[]         8 Sensory 0[x]   1[]  2[]  UN[]         9 Best Language 0[]  1[]  2[]  3[x]         10 Dysarthria 0[]  1[]  2[x]  UN[]         11 Extinct. and Inattention 0[x]  1[]  2[]           TOTAL:  29         ROS   Unable to ascertain due to AMS  Past History   Past Medical History:  Diagnosis Date   CAD (coronary artery disease)    cath 5/23 100% dist RCA lesion treated with 2 overlapping Integrity Resolute DES ( 2.25x76mm, 2.25x51mm), 60% mid RCA treated medically, 99% OM2 not amenable to PCI, 70% D1 lesion, EF normal   Hypercholesteremia    Hypertension    Hypothyroidism    Kidney disease    Myocardial infarction [facility])    Renal disorder    Stroke (HCC) 10/2018   Thyroid  disease    Type 2 diabetes mellitus (HCC) 10/08/2018    Past Surgical History:  Procedure Laterality Date   ABDOMINAL HYSTERECTOMY     AV FISTULA  PLACEMENT Right 06/28/2022   Procedure: RIGHT ARM ARTERIOVENOUS (AV) FISTULA CREATION;  Surgeon: Oris Krystal FALCON, MD;  Location: [facility] ORS;  Service: Vascular;  Laterality: Right;   CARDIAC CATHETERIZATION N/A 11/03/2015   Procedure: Left Heart Cath and Coronary Angiography;  Surgeon: Alm LELON Clay, MD;  Location: [facility] INVASIVE CV LAB;  Service: Cardiovascular;  Laterality: N/A;   CARDIAC CATHETERIZATION N/A 11/03/2015   Procedure: Coronary Stent Intervention;  Surgeon: Alm LELON Clay, MD;  Location: [facility] INVASIVE CV LAB;  Service: Cardiovascular;  Laterality: N/A;   CARPAL TUNNEL RELEASE     CORONARY ANGIOPLASTY     1 stent   FISTULA SUPERFICIALIZATION Right 08/16/2022   Procedure: RIGHT ARTERIOVENOUS FISTULA SUPERFICIALIZATION;  Surgeon: Oris Krystal FALCON, MD;  Location: [facility] ORS;  Service: Vascular;  Laterality: Right;   TOTAL KNEE ARTHROPLASTY Right 04/02/2019   Procedure: TOTAL KNEE ARTHROPLASTY;  Surgeon: Margrette Taft BRAVO, MD;  Location: [facility] ORS;  Service: Orthopedics;  Laterality: Right;    Family History: Family History  Problem Relation Age of Onset   Heart disease Mother    Stroke Sister    Heart  attack Brother    Breast cancer Neg Hx     Social History  reports that she quit smoking about 2 years ago. Her smoking use included cigarettes. She has never used smokeless tobacco. She reports that she does not drink alcohol  and does not use drugs.  Allergies  Allergen Reactions   Tnkase  [Tenecteplase ] Other (See Comments)    Brain bleed   Glucophage [Metformin] Diarrhea    Medications  No current facility-administered medications for this encounter.  Current Outpatient Medications:    HYDROcodone -acetaminophen  (NORCO/VICODIN) 5-325 MG tablet, Take 1 tablet by mouth every 6 (six) hours as needed for up to 3 days for moderate pain (pain score 4-6)., Disp: 10 tablet, Rfl: 0   acetaminophen  (TYLENOL ) 500 MG tablet, Take 1,000 mg by mouth every 6 (six) hours as needed for mild pain (pain score 1-3)., Disp: , Rfl:    amLODipine  (NORVASC ) 5 MG tablet, Take 1 tablet by mouth daily., Disp: , Rfl:    apixaban  (ELIQUIS ) 5 MG TABS tablet, Take 1 tablet (5 mg total) by mouth 2 (two) times daily., Disp: 180 tablet, Rfl: 1   aspirin  81 MG chewable tablet, Chew 1 tablet (81 mg total) by mouth daily., Disp: 30 tablet, Rfl: 2   atorvastatin  (LIPITOR ) 80 MG tablet, TAKE 1 TABLET BY MOUTH ONCE DAILY AT  6  IN  THE  EVENING (Patient taking differently: Take 80 mg by mouth every evening. TAKE 1 TABLET BY MOUTH ONCE DAILY AT  6  IN  THE  EVENING), Disp: 90 tablet, Rfl: 3   buPROPion  (WELLBUTRIN  SR) 150 MG 12 hr tablet, Take 1 tablet (150 mg total) by mouth 2 (two) times daily., Disp: 180 tablet, Rfl: 3   carvedilol  (COREG ) 12.5 MG tablet, Take 6.25 mg by mouth 2 (two) times daily with a meal., Disp: , Rfl:    cephALEXin  (KEFLEX ) 500 MG capsule, Take 1 capsule (500 mg total) by mouth 4 (four) times daily for 7 days., Disp: 28 capsule, Rfl: 0   DULoxetine  (CYMBALTA ) 60 MG capsule, Take 1 capsule (60 mg total) by mouth daily., Disp: 90 capsule, Rfl: 3   iron sucrose 100 mg in sodium chloride  0.9 % 100 mL,  Inject 100 mg into the vein every dialysis (low iron)., Disp: , Rfl:    levothyroxine  (SYNTHROID ) 175 MCG tablet, Take 175 mcg by mouth daily  before breakfast., Disp: , Rfl:    lidocaine -prilocaine  (EMLA ) cream, Apply 1 Application topically daily as needed (HD)., Disp: , Rfl:    memantine  (NAMENDA ) 10 MG tablet, Take 1 tablet (10 mg total) by mouth 2 (two) times daily., Disp: 60 tablet, Rfl: 11   Methoxy PEG-Epoetin Beta (MIRCERA) 200 MCG/0.3ML SOSY, 200 mcg by Implant route daily as needed (low RBC at dialysis). Mircera, Disp: , Rfl:    nitroGLYCERIN  (NITROSTAT ) 0.4 MG SL tablet, Place 0.4 mg under the tongue every 5 (five) minutes x 3 doses as needed for chest pain., Disp: , Rfl:    OVER THE COUNTER MEDICATION, Apply 1 application  topically See admin instructions. Zinc barrier cream- apply to buttocks with each incontinence episode., Disp: , Rfl:    Potassium Chloride  ER 20 MEQ TBCR, Take 1 tablet by mouth daily., Disp: , Rfl:    potassium chloride  SA (KLOR-CON  M) 20 MEQ tablet, Take 1 tablet (20 mEq total) by mouth once for 1 dose. (Patient not taking: Reported on May 01, 2024), Disp: , Rfl:    sevelamer  carbonate (RENVELA ) 0.8 g PACK packet, Take 0.8 g by mouth 3 (three) times daily with meals., Disp: , Rfl:    sulfamethoxazole-trimethoprim (BACTRIM DS) 800-160 MG tablet, Take 1 tablet by mouth daily. (Patient not taking: Reported on May 01, 2024), Disp: 7 tablet, Rfl: 0  Vitals   Vitals:   04/06/24 1315 04/06/24 1330 04/06/24 1345 04/06/24 1400  BP: (!) 170/67 (!) 167/60 (!) 171/62 (!) 160/69  Pulse: 67 67 72 70  Resp: 16 14 15 15   Temp:      TempSrc:      SpO2: 97% 98% 96% 95%  Weight:      Height:        Body mass index is 28.49 kg/m.  Physical Exam   Gen: patient lying in bed, NAD CV: extremities appear well-perfused Resp: normal WOB   Neurologic exam MS: lethargic, does not answer orientation questions or follow commands Speech: mute CN: PERRL, VFF, EOMI, sensation intact,  face symmetric, hearing intact to voice Motor & sensory: in response to noxious stimuli, no movement BUE, no movement against gravity BLE Reflexes: UTA 2/2 tele-exam Coordination: UTA Gait: deferred  Labs/Imaging/Neurodiagnostic studies   CBC:  Recent Labs  Lab 01-May-2024 1753 04/06/24 1251  WBC 6.6 6.5  NEUTROABS  --  5.2  HGB 9.6* 9.8*  HCT 30.9* 31.0*  MCV 90.4 92.0  PLT 233 249   Basic Metabolic Panel:  Lab Results  Component Value Date   NA 138 04/06/2024   K 3.8 04/06/2024   CO2 25 04/06/2024   GLUCOSE 157 (H) 04/06/2024   BUN 23 04/06/2024   CREATININE 4.53 (H) 04/06/2024   CALCIUM  9.1 04/06/2024   GFRNONAA 10 (L) 04/06/2024   GFRAA 43 (L) 02/05/2020   Lipid Panel:  Lab Results  Component Value Date   LDLCALC 76 11/29/2023   HgbA1c:  Lab Results  Component Value Date   HGBA1C 5.7 (H) 11/29/2023   Urine Drug Screen:     Component Value Date/Time   LABOPIA NONE DETECTED 11/20/2023 2215   COCAINSCRNUR NONE DETECTED 11/20/2023 2215   LABBENZ NONE DETECTED 11/20/2023 2215   AMPHETMU NONE DETECTED 11/20/2023 2215   THCU NONE DETECTED 11/20/2023 2215   LABBARB NONE DETECTED 11/20/2023 2215    Alcohol  Level     Component Value Date/Time   [facility] <15 04/06/2024 1251   INR  Lab Results  Component Value Date   INR 1.2 04/06/2024  APTT  Lab Results  Component Value Date   APTT 82 (H) 04/06/2024   AED levels: No results found for: PHENYTOIN, ZONISAMIDE, LAMOTRIGINE, LEVETIRACETA  CT Head without contrast(Personally reviewed): No acute process  ASSESSMENT   Jennifer [name] is a 67 y.o. 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 TNK not administered 2/2 prior ICH. CTA not performed bc patient would not be candidate for thrombectomy even if LVO were found 2/2 poor  functional status (bedbound at baseline with dementia). After administration of 2mg  IM ativan  patient improved and was able to have a conversation with EDP. Favor complex partial seizure although MRI brain should be performed to r/o ischemic stroke causing sz.  RECOMMENDATIONS   - Transfer to [facility] for cEEG, MRI compatible leads - Keppra 20mg /kg f/b 500mg  bid - MRI brain wo contrast, proceed with stroke workup if e/o acute ischemia ______________________________________________________________________    Signed, Elida CHRISTELLA Ross, MD [facility]

## 2024-04-06 NOTE — Consult Note (Signed)
 ESRD Consult Note  Requesting provider: Dr. Trixie  Reason for consult: ESRD, provision of dialysis  Assessment/Recommendations:  ESRD  -outpatient HD orders: HHD, home clinic: [facility]). BFR 400, DFR 16.4L/hr. EDW 86.5. 2k, 45 lactate. AVF 15g (buttonholes). No heparin . Meds: venofer 100mg , last mircera 225mcg on 9/24 -no acute indications for renal replacement therapy as of today, typically does HD on Tues, Wed, Sat, Sun. -Will tentatively plan for HD on Monday and most likely keep her on MWF schedule  Encephalopathy Partial Complex Seizure -per neuro -MRI pending -receiving keppra  Hemorrhagic Cystitis -urology consulted   Volume/ hypertension  -UF as tolerated with HD  Anemia of Chronic Kidney Disease -Hemoglobin 9.8, Hgb outpatient was 11.5 on 10/8. Monitor for now  -Transfuse PRN for Hgb <7  Secondary Hyperparathyroidism/Hyperphosphatemia - resume home binders, monitor phos   DM2 -per PCP  # Additional recommendations: - Dose all meds for creatinine clearance < 10 ml/min  - Unless absolutely necessary, no MRIs with gadolinium.  - Implement save arm precautions.  Prefer needle sticks in the dorsum of the hands or wrists.  No blood pressure measurements in arm. - If blood transfusion is requested during hemodialysis sessions, please alert us  prior to the session.  - If a hemodialysis catheter line culture is requested, please alert us  as only hemodialysis nurses are able to collect those specimens.   Recommendations were discussed with the primary team.  Ephriam Stank, MD [facility]  History of Present Illness: Jennifer [name] is a/an 68 y.o. female with a past medical history of ESRD on HHD, DM, HTN, dementia (bedbound), history of ICH, hemorrhagic cystitis, history of PE on Eliquis  who presents with strokelike symptoms.  Symptoms occurred while she was undergoing dialysis at home earlier this morning.  Transferred here for EEG.  Had right gaze  deviation and decreased responsiveness.  TNK not administered due to history of prior ICH, not candidate for thrombectomy due to poor functional status. Patient seen and examined bedside in ER, 2 daughters in room. They report that she did half a treatment of dialysis this AM until symptoms started, last HD was on Tues, missed Wed because they were in the ER. They report that she has had 20 lbs weight loss in the last month, decreased PO intake. Palliative does follow with her, last seen a month ago. HD otherwise has been going okay, no issues with access. ROS unobtainable given her mental status.   Medications:  Current Facility-Administered Medications  Medication Dose Route Frequency Provider Last Rate Last Admin   [START ON 04/07/2024] levETIRAcetam (KEPPRA) tablet 500 mg  500 mg Oral Daily Khaliqdina, Salman, MD       Current Outpatient Medications  Medication Sig Dispense Refill   HYDROcodone -acetaminophen  (NORCO/VICODIN) 5-325 MG tablet Take 1 tablet by mouth every 6 (six) hours as needed for up to 3 days for moderate pain (pain score 4-6). 10 tablet 0   acetaminophen  (TYLENOL ) 500 MG tablet Take 1,000 mg by mouth every 6 (six) hours as needed for mild pain (pain score 1-3).     amLODipine  (NORVASC ) 5 MG tablet Take 1 tablet by mouth daily.     apixaban  (ELIQUIS ) 5 MG TABS tablet Take 1 tablet (5 mg total) by mouth 2 (two) times daily. 180 tablet 1   aspirin  81 MG chewable tablet Chew 1 tablet (81 mg total) by mouth daily. 30 tablet 2   atorvastatin  (LIPITOR ) 80 MG tablet TAKE 1 TABLET BY MOUTH ONCE DAILY AT  6  IN  THE  EVENING (Patient taking differently: Take 80 mg by mouth every evening. TAKE 1 TABLET BY MOUTH ONCE DAILY AT  6  IN  THE  EVENING) 90 tablet 3   buPROPion  (WELLBUTRIN  SR) 150 MG 12 hr tablet Take 1 tablet (150 mg total) by mouth 2 (two) times daily. 180 tablet 3   carvedilol  (COREG ) 12.5 MG tablet Take 6.25 mg by mouth 2 (two) times daily with a meal.     cephALEXin   (KEFLEX ) 500 MG capsule Take 1 capsule (500 mg total) by mouth 4 (four) times daily for 7 days. 28 capsule 0   DULoxetine  (CYMBALTA ) 60 MG capsule Take 1 capsule (60 mg total) by mouth daily. 90 capsule 3   iron sucrose 100 mg in sodium chloride  0.9 % 100 mL Inject 100 mg into the vein every dialysis (low iron).     levothyroxine  (SYNTHROID ) 175 MCG tablet Take 175 mcg by mouth daily before breakfast.     lidocaine -prilocaine  (EMLA ) cream Apply 1 Application topically daily as needed (HD).     memantine  (NAMENDA ) 10 MG tablet Take 1 tablet (10 mg total) by mouth 2 (two) times daily. 60 tablet 11   Methoxy PEG-Epoetin Beta (MIRCERA) 200 MCG/0.3ML SOSY 200 mcg by Implant route daily as needed (low RBC at dialysis). Mircera     nitroGLYCERIN  (NITROSTAT ) 0.4 MG SL tablet Place 0.4 mg under the tongue every 5 (five) minutes x 3 doses as needed for chest pain.     OVER THE COUNTER MEDICATION Apply 1 application  topically See admin instructions. Zinc barrier cream- apply to buttocks with each incontinence episode.     Potassium Chloride  ER 20 MEQ TBCR Take 1 tablet by mouth daily.     potassium chloride  SA (KLOR-CON  M) 20 MEQ tablet Take 1 tablet (20 mEq total) by mouth once for 1 dose. (Patient not taking: Reported on 04/03/2024)     sevelamer  carbonate (RENVELA ) 0.8 g PACK packet Take 0.8 g by mouth 3 (three) times daily with meals.     sulfamethoxazole-trimethoprim (BACTRIM DS) 800-160 MG tablet Take 1 tablet by mouth daily. (Patient not taking: Reported on 04/03/2024) 7 tablet 0     ALLERGIES Tnkase  [tenecteplase ] and Glucophage [metformin]  MEDICAL HISTORY Past Medical History:  Diagnosis Date   CAD (coronary artery disease)    cath 5/23 100% dist RCA lesion treated with 2 overlapping Integrity Resolute DES ( 2.25x83mm, 2.25x23mm), 60% mid RCA treated medically, 99% OM2 not amenable to PCI, 70% D1 lesion, EF normal   Hypercholesteremia    Hypertension    Hypothyroidism    Kidney disease     Myocardial infarction [facility])    Renal disorder    Stroke (HCC) 10/2018   Thyroid  disease    Type 2 diabetes mellitus (HCC) 10/08/2018     SOCIAL HISTORY Social History   Socioeconomic History   Marital status: Married    Spouse name: Not on file   Number of children: Not on file   Years of education: Not on file   Highest education level: Not on file  Occupational History   Not on file  Tobacco Use   Smoking status: Former    Current packs/day: 0.00    Types: Cigarettes    Quit date: 05/13/2021    Years since quitting: 2.9   Smokeless tobacco: Never  Vaping Use   Vaping status: Never Used  Substance and Sexual Activity   Alcohol  use: No    Alcohol /week: 0.0 standard drinks  of alcohol    Drug use: No   Sexual activity: Not Currently  Other Topics Concern   Not on file  Social History Narrative   Not on file   Social Drivers of Health   Financial Resource Strain: Low Risk  (03/12/2024)   Overall Financial Resource Strain (CARDIA)    Difficulty of Paying Living Expenses: Not hard at all  Food Insecurity: No Food Insecurity (03/12/2024)   Hunger Vital Sign    Worried About Running Out of Food in the Last Year: Never true    Ran Out of Food in the Last Year: Never true  Transportation Needs: No Transportation Needs (03/12/2024)   PRAPARE - Administrator, Civil Service (Medical): No    Lack of Transportation (Non-Medical): No  Physical Activity: Inactive (03/07/2023)   Exercise Vital Sign    Days of Exercise per Week: 0 days    Minutes of Exercise per Session: 0 min  Stress: Stress Concern Present (03/12/2024)   [employer] of Occupational Health - Occupational Stress Questionnaire    Feeling of Stress: Rather much  Social Connections: Moderately Isolated (03/12/2024)   Social Connection and Isolation Panel    Frequency of Communication with Friends and Family: Never    Frequency of Social Gatherings with Friends and Family: Never    Attends Religious  Services: Never    Database Administrator or Organizations: Yes    Attends Banker Meetings: Never    Marital Status: Married  Catering Manager Violence: Not At Risk (03/12/2024)   Humiliation, Afraid, Rape, and Kick questionnaire    Fear of Current or Ex-Partner: No    Emotionally Abused: No    Physically Abused: No    Sexually Abused: No     FAMILY HISTORY Family History  Problem Relation Age of Onset   Heart disease Mother    Stroke Sister    Heart attack Brother    Breast cancer Neg Hx      Review of Systems: 12 systems were reviewed and negative except per HPI  Physical Exam: Vitals:   04/06/24 1545 04/06/24 1557  BP: (!) 166/63   Pulse: 70   Resp: 14   Temp:  97.6 F (36.4 C)  SpO2: (!) 73%    No intake/output data recorded. No intake or output data in the 24 hours ending 04/06/24 1622 General: chronically ill appearing HEENT: anicteric sclera, MMM CV: normal rate, no murmurs, no edema Lungs: bilateral chest rise, normal wob Abd: soft, non-tender, non-distended Skin: +wound right shin, no rashes Neuro: awake, not really following commands, slow movements Dialysis access: RUE AVF +b/t (buttonholes)  Test Results Reviewed Lab Results  Component Value Date   NA 138 04/06/2024   K 3.8 04/06/2024   CL 94 (L) 04/06/2024   CO2 25 04/06/2024   BUN 23 04/06/2024   CREATININE 4.53 (H) 04/06/2024   GFR 10.95 (LL) 11/19/2021   CALCIUM  9.1 04/06/2024   ALBUMIN  3.8 04/06/2024   PHOS 3.4 12/12/2023    I have reviewed relevant outside healthcare records

## 2024-04-06 NOTE — Progress Notes (Signed)
 I evaluated [name] at the bedside. She is awake, oriented to self and place and follows commands and answers all questions. She has mild slurred speech, she has antigravity strength throughout but weaker hand grip on the left. She can lift RLE off the bed but has difficulty with LLE. She reports she cannot walk at baseline. Family helps her get to and from bathroom.  No obvious clinical seizure activity noted.  Plan: - Dont think a cEEG is warranted at this time. - will load with Keppra 20mg /Kg. Since she is ESRD, will do Keppra 500mg  daily and do additional Keppra 500mg  on days of dialysis(MWF). - MRI Brain without contrast when able. - routine EEG tomorrow AM.  Jennifer [name] [facility]

## 2024-04-07 ENCOUNTER — Observation Stay (HOSPITAL_COMMUNITY)

## 2024-04-07 DIAGNOSIS — K5900 Constipation, unspecified: Secondary | ICD-10-CM | POA: Diagnosis present

## 2024-04-07 DIAGNOSIS — I120 Hypertensive chronic kidney disease with stage 5 chronic kidney disease or end stage renal disease: Secondary | ICD-10-CM

## 2024-04-07 DIAGNOSIS — G40209 Localization-related (focal) (partial) symptomatic epilepsy and epileptic syndromes with complex partial seizures, not intractable, without status epilepticus: Secondary | ICD-10-CM | POA: Diagnosis present

## 2024-04-07 DIAGNOSIS — G9341 Metabolic encephalopathy: Secondary | ICD-10-CM | POA: Diagnosis present

## 2024-04-07 DIAGNOSIS — W228XXA Striking against or struck by other objects, initial encounter: Secondary | ICD-10-CM | POA: Diagnosis present

## 2024-04-07 DIAGNOSIS — N3091 Cystitis, unspecified with hematuria: Secondary | ICD-10-CM | POA: Diagnosis present

## 2024-04-07 DIAGNOSIS — D631 Anemia in chronic kidney disease: Secondary | ICD-10-CM | POA: Diagnosis present

## 2024-04-07 DIAGNOSIS — I132 Hypertensive heart and chronic kidney disease with heart failure and with stage 5 chronic kidney disease, or end stage renal disease: Secondary | ICD-10-CM | POA: Diagnosis present

## 2024-04-07 DIAGNOSIS — R569 Unspecified convulsions: Secondary | ICD-10-CM | POA: Diagnosis present

## 2024-04-07 DIAGNOSIS — F0390 Unspecified dementia without behavioral disturbance: Secondary | ICD-10-CM | POA: Diagnosis not present

## 2024-04-07 DIAGNOSIS — G9340 Encephalopathy, unspecified: Secondary | ICD-10-CM | POA: Diagnosis not present

## 2024-04-07 DIAGNOSIS — E039 Hypothyroidism, unspecified: Secondary | ICD-10-CM | POA: Diagnosis present

## 2024-04-07 DIAGNOSIS — I959 Hypotension, unspecified: Secondary | ICD-10-CM | POA: Diagnosis present

## 2024-04-07 DIAGNOSIS — Z992 Dependence on renal dialysis: Secondary | ICD-10-CM | POA: Diagnosis not present

## 2024-04-07 DIAGNOSIS — N186 End stage renal disease: Secondary | ICD-10-CM

## 2024-04-07 DIAGNOSIS — I5032 Chronic diastolic (congestive) heart failure: Secondary | ICD-10-CM | POA: Diagnosis present

## 2024-04-07 DIAGNOSIS — F32A Depression, unspecified: Secondary | ICD-10-CM | POA: Diagnosis present

## 2024-04-07 DIAGNOSIS — Z7901 Long term (current) use of anticoagulants: Secondary | ICD-10-CM | POA: Diagnosis not present

## 2024-04-07 DIAGNOSIS — R310 Gross hematuria: Secondary | ICD-10-CM | POA: Diagnosis present

## 2024-04-07 DIAGNOSIS — G8194 Hemiplegia, unspecified affecting left nondominant side: Secondary | ICD-10-CM | POA: Diagnosis present

## 2024-04-07 DIAGNOSIS — E7800 Pure hypercholesterolemia, unspecified: Secondary | ICD-10-CM | POA: Diagnosis present

## 2024-04-07 DIAGNOSIS — E876 Hypokalemia: Secondary | ICD-10-CM | POA: Diagnosis present

## 2024-04-07 DIAGNOSIS — Z66 Do not resuscitate: Secondary | ICD-10-CM | POA: Diagnosis present

## 2024-04-07 DIAGNOSIS — B961 Klebsiella pneumoniae [K. pneumoniae] as the cause of diseases classified elsewhere: Secondary | ICD-10-CM | POA: Diagnosis present

## 2024-04-07 DIAGNOSIS — Z1611 Resistance to penicillins: Secondary | ICD-10-CM | POA: Diagnosis present

## 2024-04-07 DIAGNOSIS — N2581 Secondary hyperparathyroidism of renal origin: Secondary | ICD-10-CM | POA: Diagnosis present

## 2024-04-07 DIAGNOSIS — F0153 Vascular dementia, unspecified severity, with mood disturbance: Secondary | ICD-10-CM | POA: Diagnosis present

## 2024-04-07 DIAGNOSIS — E1122 Type 2 diabetes mellitus with diabetic chronic kidney disease: Secondary | ICD-10-CM | POA: Diagnosis present

## 2024-04-07 LAB — PHOSPHORUS: Phosphorus: 2.6 mg/dL (ref 2.5–4.6)

## 2024-04-07 LAB — COMPREHENSIVE METABOLIC PANEL WITH GFR
ALT: 8 U/L (ref 0–44)
AST: 13 U/L — ABNORMAL LOW (ref 15–41)
Albumin: 2.9 g/dL — ABNORMAL LOW (ref 3.5–5.0)
Alkaline Phosphatase: 70 U/L (ref 38–126)
Anion gap: 13 (ref 5–15)
BUN: 24 mg/dL — ABNORMAL HIGH (ref 8–23)
CO2: 28 mmol/L (ref 22–32)
Calcium: 8.5 mg/dL — ABNORMAL LOW (ref 8.9–10.3)
Chloride: 96 mmol/L — ABNORMAL LOW (ref 98–111)
Creatinine, Ser: 5.42 mg/dL — ABNORMAL HIGH (ref 0.44–1.00)
GFR, Estimated: 8 mL/min — ABNORMAL LOW (ref >60)
Glucose, Bld: 74 mg/dL (ref 70–99)
Potassium: 3 mmol/L — ABNORMAL LOW (ref 3.5–5.1)
Sodium: 137 mmol/L (ref 135–145)
Total Bilirubin: 0.8 mg/dL (ref 0.0–1.2)
Total Protein: 5.1 g/dL — ABNORMAL LOW (ref 6.5–8.1)

## 2024-04-07 LAB — CBC
HCT: 27.4 % — ABNORMAL LOW (ref 36.0–46.0)
Hemoglobin: 8.7 g/dL — ABNORMAL LOW (ref 12.0–15.0)
MCH: 28.5 pg (ref 26.0–34.0)
MCHC: 31.8 g/dL (ref 30.0–36.0)
MCV: 89.8 fL (ref 80.0–100.0)
Platelets: 229 K/uL (ref 150–400)
RBC: 3.05 MIL/uL — ABNORMAL LOW (ref 3.87–5.11)
RDW: 14.8 % (ref 11.5–15.5)
WBC: 4.2 K/uL (ref 4.0–10.5)
nRBC: 0 % (ref 0.0–0.2)

## 2024-04-07 LAB — HEPATITIS B SURFACE ANTIGEN: Hepatitis B Surface Ag: NONREACTIVE

## 2024-04-07 LAB — GLUCOSE, CAPILLARY
Glucose-Capillary: 68 mg/dL — ABNORMAL LOW (ref 70–99)
Glucose-Capillary: 85 mg/dL (ref 70–99)
Glucose-Capillary: 175 mg/dL — ABNORMAL HIGH (ref 70–99)

## 2024-04-07 LAB — MAGNESIUM: Magnesium: 1.8 mg/dL (ref 1.7–2.4)

## 2024-04-07 MED ORDER — CHLORHEXIDINE GLUCONATE CLOTH 2 % EX PADS
6.0000 | MEDICATED_PAD | Freq: Every day | CUTANEOUS | Status: DC
Start: 2024-04-07 — End: 2024-04-11
  Administered 2024-04-08 – 2024-04-11 (×4): 6 via TOPICAL

## 2024-04-07 MED ORDER — INSULIN ASPART 100 UNIT/ML IJ SOLN
0.0000 [IU] | Freq: Three times a day (TID) | INTRAMUSCULAR | Status: DC
  Administered 2024-04-09 – 2024-04-13 (×9): 1 [IU] via SUBCUTANEOUS

## 2024-04-07 MED ORDER — LORAZEPAM 2 MG/ML IJ SOLN
0.5000 mg | Freq: Once | INTRAMUSCULAR | Status: AC | PRN
  Administered 2024-04-07: 0.5 mg via INTRAVENOUS
  Filled 2024-04-07: qty 1

## 2024-04-07 MED ORDER — SODIUM CHLORIDE 0.9 % IR SOLN
3000.0000 mL | Status: DC
  Administered 2024-04-07 – 2024-04-08 (×11): 3000 mL

## 2024-04-07 MED ORDER — SODIUM CHLORIDE 0.9 % IV SOLN
1.0000 g | INTRAVENOUS | Status: DC
  Administered 2024-04-07 – 2024-04-11 (×5): 1 g via INTRAVENOUS
  Filled 2024-04-07 (×6): qty 10

## 2024-04-07 NOTE — ED Notes (Signed)
 Patient transported to MRI

## 2024-04-07 NOTE — Progress Notes (Signed)
 EEG equipment transferred to 3W02 with patient. [facility] now monitoring.

## 2024-04-07 NOTE — ED Notes (Signed)
 Patient is very difficult to arouse at this time, keeps falling back asleep.

## 2024-04-07 NOTE — Evaluation (Signed)
 Clinical/Bedside Swallow Evaluation Patient Details  Name: Jennifer [name] MRN: [id number] Date of Birth: 07-Aug-1955  Today's Date: 04/07/2024 Time: SLP Start Time (ACUTE ONLY): 0759 SLP Stop Time (ACUTE ONLY): 0815 SLP Time Calculation (min) (ACUTE ONLY): 16 min  Past Medical History:  Past Medical History:  Diagnosis Date   CAD (coronary artery disease)    cath 5/23 100% dist RCA lesion treated with 2 overlapping Integrity Resolute DES ( 2.25x57mm, 2.25x25mm), 60% mid RCA treated medically, 99% OM2 not amenable to PCI, 70% D1 lesion, EF normal   Hypercholesteremia    Hypertension    Hypothyroidism    Kidney disease    Myocardial infarction [facility])    Renal disorder    Stroke (HCC) 10/2018   Thyroid  disease    Type 2 diabetes mellitus (HCC) 10/08/2018   Past Surgical History:  Past Surgical History:  Procedure Laterality Date   ABDOMINAL HYSTERECTOMY     AV FISTULA PLACEMENT Right 06/28/2022   Procedure: RIGHT ARM ARTERIOVENOUS (AV) FISTULA CREATION;  Surgeon: Oris Krystal FALCON, MD;  Location: [facility];  Service: Vascular;  Laterality: Right;   CARDIAC CATHETERIZATION N/A 11/03/2015   Procedure: Left Heart Cath and Coronary Angiography;  Surgeon: Alm LELON Clay, MD;  Location: [facility] INVASIVE CV LAB;  Service: Cardiovascular;  Laterality: N/A;   CARDIAC CATHETERIZATION N/A 11/03/2015   Procedure: Coronary Stent Intervention;  Surgeon: Alm LELON Clay, MD;  Location: [facility] INVASIVE CV LAB;  Service: Cardiovascular;  Laterality: N/A;   CARPAL TUNNEL RELEASE     CORONARY ANGIOPLASTY     1 stent   FISTULA SUPERFICIALIZATION Right 08/16/2022   Procedure: RIGHT ARTERIOVENOUS FISTULA SUPERFICIALIZATION;  Surgeon: Oris Krystal FALCON, MD;  Location: [facility];  Service: Vascular;  Laterality: Right;   TOTAL KNEE ARTHROPLASTY Right 04/02/2019   Procedure: TOTAL KNEE ARTHROPLASTY;  Surgeon: Margrette Taft BRAVO, MD;  Location: [facility];  Service: Orthopedics;  Laterality: Right;   HPI:  68 yo female presenting  10/25 with R gaze deviation and decreased responsiveness. CTH negative for acute changes. Work up still ongoing although differential includes stroke vs seizures with latter favored per MD given positive response to ativan . PMH includes: dementia, HTN, HLD, CAD, DM, CVA, CHF, ESRD. Pt has been seen by SLP multiple times in the past, typically with an oral more than pharyngeal dysphagia (no aspiration on MBS in 2024) requiring softer solids but able to consume thin liquids.    Assessment / Plan / Recommendation  Clinical Impression  Pt appears to have a cognitively-based oral dysphagia with lethargy contributing. When alert, she shows good potential for ability to consume at least a modified diet. Will initiate Dys 1 (puree) diet and thin liquids, meds crushed in puree, although discussed with RN to only provide POs when fully alert. SLP to f/u for possible advancement as alertness improves.    Pt has reduced awareness, initially requiring siphoning via straw but then able to progress to pt initiating straw drinking. She does best with straw placed on L side given baseline R facial droop. She cleared her throat x1 on initial bolus when still fairly sleepy but with no overt coughing observed. Oral holding is noted with purees, responding better to liquid wash than to verbal cues to swallow. Most recent diet recommendation per SLP was for mechanical soft but pt does not appear to be ready for this even when she is at her most alert this morning. Hopeful for good prognosis to advance as mentation improves.   SLP Visit Diagnosis:  Dysphagia, unspecified (R13.10)    Aspiration Risk       Diet Recommendation Dysphagia 1 (Puree);Thin liquid    Liquid Administration via: Straw Medication Administration: Crushed with puree Supervision: Staff to assist with self feeding;Full supervision/cueing for compensatory strategies Compensations: Minimize environmental distractions;Slow rate;Small sips/bites Postural  Changes: Seated upright at 90 degrees;Remain upright for at least 30 minutes after po intake    Other  Recommendations Oral Care Recommendations: Oral care BID     Assistance Recommended at Discharge    Functional Status Assessment Patient has had a recent decline in their functional status and demonstrates the ability to make significant improvements in function in a reasonable and predictable amount of time.  Frequency and Duration min 2x/week  2 weeks       Prognosis Prognosis for improved oropharyngeal function: Good Barriers to Reach Goals: Cognitive deficits      Swallow Study   General HPI: 68 yo female presenting 10/25 with R gaze deviation and decreased responsiveness. CTH negative for acute changes. Work up still ongoing although differential includes stroke vs seizures with latter favored per MD given positive response to ativan . PMH includes: dementia, HTN, HLD, CAD, DM, CVA, CHF, ESRD. Pt has been seen by SLP multiple times in the past, typically with an oral more than pharyngeal dysphagia (no aspiration on MBS in 2024) requiring softer solids but able to consume thin liquids. Type of Study: Bedside Swallow Evaluation Previous Swallow Assessment: see HPI Diet Prior to this Study: NPO Temperature Spikes Noted: No Respiratory Status: Room air History of Recent Intubation: No Behavior/Cognition: Lethargic/Drowsy;Cooperative;Pleasant mood;Requires cueing Oral Cavity Assessment: Within Functional Limits Oral Care Completed by SLP: No Oral Cavity - Dentition: Missing dentition Self-Feeding Abilities: Total assist Patient Positioning: Upright in bed Baseline Vocal Quality: Normal Volitional Swallow: Able to elicit    Oral/Motor/Sensory Function Overall Oral Motor/Sensory Function:  (difficulty following commands to assess but R facial droop may be baseline per previous documentation)   Ice Chips Ice chips: Not tested   Thin Liquid Thin Liquid: Impaired Presentation:  Straw Oral Phase Impairments: Poor awareness of bolus Pharyngeal  Phase Impairments: Throat Clearing - Immediate    Nectar Thick Nectar Thick Liquid: Not tested   Honey Thick Honey Thick Liquid: Not tested   Puree Puree: Impaired Presentation: Spoon Oral Phase Functional Implications: Oral holding   Solid     Solid: Not tested      Leita SAILOR., M.A. CCC-SLP Acute Rehabilitation Services Office: [phone]  Secure chat preferred  04/07/2024,8:22 AM

## 2024-04-07 NOTE — ED Notes (Signed)
 Pt resting.

## 2024-04-07 NOTE — Progress Notes (Signed)
 LTM EEG hooked up and running - no initial skin breakdown - push button tested - [facility] NOT monitoring while patient in ED.

## 2024-04-07 NOTE — Progress Notes (Signed)
 [facility] Progress Note    Assessment/ Plan:   ESRD  -outpatient HD orders: HHD, home clinic: [facility]). BFR 400, DFR 16.4L/hr. EDW 86.5. 2k, 45 lactate. AVF 15g (buttonholes). No heparin . Meds: venofer 100mg , last mircera 225mcg on 9/24 -no acute indications for renal replacement therapy as of today, typically does HD on Tues, Wed, Sat, Sun. -Will tentatively plan for HD tomorrow and most likely keep her on MWF schedule while she's here +buttonholes with access   Encephalopathy Partial Complex Seizure -per neuro -MRI? -receiving keppra   Hemorrhagic Cystitis -urology consulted    Volume/ hypertension  -UF as tolerated with HD   Anemia of Chronic Kidney Disease -Hemoglobin 8.7, Hgb outpatient was 11.5 on 10/8. Checking iron stores -Transfuse PRN for Hgb <7   Secondary Hyperparathyroidism/Hyperphosphatemia - resume home binders, monitor phos--WNL currently   DM2 -per PCP  Subjective:   Patient seen and examined bedside. No acute events. No family at bedside   Objective:   BP (!) 156/53   Pulse 74   Temp 97.6 F (36.4 C) (Oral)   Resp 10   Ht 5' 7 (1.702 m)   Wt 82.5 kg   SpO2 95%   BMI 28.49 kg/m  No intake or output data in the 24 hours ending 04/07/24 0851 Weight change:   Physical Exam: Gen: chronically ill appearing CVS: RRR Resp: CTA BL Abd: soft, nt/nd Ext: no sig edema b/l Les, wound right shin Neuro: lethargic Dialysis access: RUE AVF +b/t  Imaging: CT HEMATURIA WORKUP Result Date: 04/06/2024 EXAM: CT UROGRAM 04/06/2024 11:51:28 PM TECHNIQUE: CT of the abdomen and pelvis was performed without and with the administration of 100 mL of iohexol  (OMNIPAQUE ) 350 MG/ML injection as per CT urogram protocol. Multiplanar reformatted images as well as MIP urogram images are provided for review. Automated exposure control, iterative reconstruction, and/or weight based adjustment of the mA/kV was utilized to reduce the radiation dose to  as low as reasonably achievable. COMPARISON: CT abdomen/pelvis dated 04/03/2024. CLINICAL HISTORY: FINDINGS: LOWER CHEST: Mild linear scarring/atelectasis at the left lung base. LIVER: The liver is unremarkable. GALLBLADDER AND BILE DUCTS: Status post cholecystectomy. No biliary ductal dilatation. SPLEEN: No acute abnormality. PANCREAS: No acute abnormality. ADRENAL GLANDS: No acute abnormality. KIDNEYS, URETERS AND BLADDER: Subcentimeter left renal cyst (image 21), benign. Per consensus, no follow-up is needed for simple Bosniak type 1 and 2 renal cysts, unless the patient has a malignancy history or risk factors. No stones in the kidneys or ureters. No hydronephrosis. No perinephric or periureteral stranding. Mildly thick-walled bladder, correlate for cystitis. Moderate layering hemorrhage in the posterior bladder, mildly improved. No focal bladder mass on CT. GI AND BOWEL: Stomach demonstrates no acute abnormality. Normal appendix (image 63). Sigmoid diverticulosis, without evidence of diverticulitis. There is no bowel obstruction. PERITONEUM AND RETROPERITONEUM: No ascites. No free air. VASCULATURE: Aorta is normal in caliber. Atherosclerotic calcifications of the abdominal aorta and branch vessels, although patent. LYMPH NODES: No lymphadenopathy. REPRODUCTIVE ORGANS: No acute abnormality. BONES AND SOFT TISSUES: Mild degenerative changes of the visualized THORACOLUMBAR spine. No focal soft tissue abnormality. IMPRESSION: 1. Moderate layering hemorrhage in the posterior bladder, mildly improved. 2. Mild bladder wall thickening, correlate for cystitis 3. No focal bladder mass on CT. Regardless, consider cystoscopy for further evaluation. Electronically signed by: Pinkie Pebbles MD 04/06/2024 11:59 PM EDT RP Workstation: [id number]   CT HEAD CODE STROKE WO CONTRAST (LKW 0-4.5h, LVO 0-24h) Result Date: 04/06/2024 EXAM: CT HEAD WITHOUT 04/06/2024 12:33:24 PM TECHNIQUE: CT of  the head was performed without the  administration of intravenous contrast. Automated exposure control, iterative reconstruction, and/or weight based adjustment of the mA/kV was utilized to reduce the radiation dose to as low as reasonably achievable. COMPARISON: CT head without contrast 01/24/2024. MR head without contrast 11/27/2023. CLINICAL HISTORY: FINDINGS: BRAIN AND VENTRICLES: No acute intracranial hemorrhage. No mass effect or midline shift. No extra-axial fluid collection. No evidence of acute infarct. Remote infarcts again noted in the cerebellum bilaterally. Remote lacunar infarcts are present in the right basal ganglia and corona radiata without significant interval change. Remote lacunar infarcts are stable in both thalami. Periventricular and subcortical white matter hypoattenuation is moderately advanced for age, stable. Atherosclerotic calcifications are present in the cavernous carotid arteries bilaterally. No hyperdense vessel is present. No hydrocephalus. ORBITS: Bilateral lens replacements are noted. The globes and orbits are otherwise within normal limits. SINUSES AND MASTOIDS: No acute abnormality. SOFT TISSUES AND SKULL: No acute skull fracture. No acute soft tissue abnormality. IMPRESSION: 1. No acute intracranial abnormality. 2. Stable remote infarcts in the cerebellum bilaterally, right basal ganglia, and corona radiata. 3. Stable lacunar infarcts in both thalami. 4. Moderately advanced periventricular and subcortical white matter hypoattenuation, stable. 5. Atherosclerotic calcifications in the cavernous carotid arteries bilaterally. No hyperdense vessel present. Electronically signed by: Lonni Necessary MD 04/06/2024 12:39 PM EDT RP Workstation: [id number]    Labs: BMET Recent Labs  Lab 04/03/24 1753 04/06/24 1251 04/07/24 0327  NA 137 138 137  K 3.2* 3.8 3.0*  CL 95* 94* 96*  CO2 30 25 28   GLUCOSE 98 157* 74  BUN 18 23 24*  CREATININE 4.42* 4.53* 5.42*  CALCIUM  9.1 9.1 8.5*  PHOS  --   --  2.6    CBC Recent Labs  Lab 04/03/24 1753 04/06/24 1251 04/07/24 0327  WBC 6.6 6.5 4.2  NEUTROABS  --  5.2  --   HGB 9.6* 9.8* 8.7*  HCT 30.9* 31.0* 27.4*  MCV 90.4 92.0 89.8  PLT 233 249 229    Medications:     amLODipine   5 mg Oral Daily   atorvastatin   80 mg Oral QPM   carvedilol   6.25 mg Oral BID WC   DULoxetine   60 mg Oral Daily   levETIRAcetam  500 mg Oral Daily   [START ON 04/08/2024] levETIRAcetam  500 mg Intravenous Once per day on Monday Wednesday Friday   levothyroxine   175 mcg Oral QAC breakfast   memantine   10 mg Oral BID   sevelamer  carbonate  0.8 g Oral TID WC      Ephriam Stank, MD [facility] 04/07/2024, 8:51 AM

## 2024-04-07 NOTE — Plan of Care (Signed)
  Problem: Education: Goal: Knowledge of General Education information will improve Description: Including pain rating scale, medication(s)/side effects and non-pharmacologic comfort measures Outcome: Progressing   Problem: Health Behavior/Discharge Planning: Goal: Ability to manage health-related needs will improve Outcome: Progressing   Problem: Clinical Measurements: Goal: Will remain free from infection Outcome: Progressing Goal: Diagnostic test results will improve Outcome: Progressing Goal: Respiratory complications will improve Outcome: Progressing Goal: Cardiovascular complication will be avoided Outcome: Progressing   Problem: Activity: Goal: Risk for activity intolerance will decrease Outcome: Progressing   Problem: Nutrition: Goal: Adequate nutrition will be maintained Outcome: Progressing   Problem: Coping: Goal: Level of anxiety will decrease Outcome: Progressing   Problem: Elimination: Goal: Will not experience complications related to bowel motility Outcome: Progressing Goal: Will not experience complications related to urinary retention Outcome: Progressing   Problem: Pain Managment: Goal: General experience of comfort will improve and/or be controlled Outcome: Progressing   Problem: Safety: Goal: Ability to remain free from injury will improve Outcome: Progressing   Problem: Skin Integrity: Goal: Risk for impaired skin integrity will decrease Outcome: Progressing   Problem: Fluid Volume: Goal: Ability to maintain a balanced intake and output will improve Outcome: Progressing   Problem: Metabolic: Goal: Ability to maintain appropriate glucose levels will improve Outcome: Progressing   Problem: Nutritional: Goal: Maintenance of adequate nutrition will improve Outcome: Progressing   Problem: Skin Integrity: Goal: Risk for impaired skin integrity will decrease Outcome: Progressing   Problem: Tissue Perfusion: Goal: Adequacy of tissue  perfusion will improve Outcome: Progressing

## 2024-04-07 NOTE — Progress Notes (Signed)
 NEUROLOGY CONSULT FOLLOW UP NOTE   Date of service: April 07, 2024 Patient Name: Jennifer [name] MRN:  [id number] DOB:  April 03, 1956  Interval Hx/subjective   Very lethargic today. Eyes closed and asleep when we saw her this AM.  Vitals   Vitals:   04/07/24 0600 04/07/24 0700 04/07/24 0723 04/07/24 0900  BP: (!) 153/57 (!) 156/53  (!) 170/58  Pulse:  74  69  Resp: 15 10  14   Temp:   97.6 F (36.4 C)   TempSrc:   Oral   SpO2:  95%  98%  Weight:      Height:         Body mass index is 28.49 kg/m.  Physical Exam   General: Laying comfortably in bed; appears frail and chronically ill. HENT: Normal oropharynx and mucosa. Normal external appearance of ears and nose.  Neck: Supple, no pain or tenderness  CV: No JVD. No peripheral edema.  Pulmonary: Symmetric Chest rise. Normal respiratory effort.  Abdomen: Soft to touch, non-tender.  Ext: No cyanosis, edema, or deformity  Skin: No rash. Normal palpation of skin.   Musculoskeletal: Normal digits and nails by inspection. No clubbing.   Neurologic Examination  Mental status/Cognition: eyes closed, opens eyes briefly to loud voice/clap and makes brief eye cotnact on right. Speech/language: slurred speech, non fluent, follows commands more with RUE than LUE Cranial nerves:   CN II Pupils equal and reactive to light, no VF deficit   CN III,IV,VI EOM intact, no gaze preference or deviation, no nystagmus    CN V normal sensation in V1, V2, and V3 segments bilaterally    CN VII R facial droop   CN VIII normal hearing to speech   CN IX & X normal palatal elevation, no uvular deviation    CN XI Head midline   CN XII midline tongue protrusion    Motor:  Muscle bulk: poor, tone normal Antigravity movements in all extremities. Moves RUE more than left. Moves BL lower extremities to proximal pinch and able to lift them off the bed slightly.  Sensation:  Light touch    Pin prick Localizes to proximal pinch in all extremities.    Temperature    Vibration   Proprioception    Coordination/Complex Motor:  -Unable to assess.  Medications  Current Facility-Administered Medications:    acetaminophen  (TYLENOL ) tablet 650 mg, 650 mg, Oral, Q6H PRN **OR** acetaminophen  (TYLENOL ) suppository 650 mg, 650 mg, Rectal, Q6H PRN, Gherghe, Costin M, MD   amLODipine  (NORVASC ) tablet 5 mg, 5 mg, Oral, Daily, Gherghe, Costin M, MD   atorvastatin  (LIPITOR ) tablet 80 mg, 80 mg, Oral, QPM, Gherghe, Costin M, MD   carvedilol  (COREG ) tablet 6.25 mg, 6.25 mg, Oral, BID WC, Gherghe, Costin M, MD   cefTRIAXone  (ROCEPHIN ) 1 g in sodium chloride  0.9 % 100 mL IVPB, 1 g, Intravenous, Q24H, Gherghe, Costin M, MD, Last Rate: 200 mL/hr at 04/07/24 1055, 1 g at 04/07/24 1055   Chlorhexidine  Gluconate Cloth 2 % PADS 6 each, 6 each, Topical, Q0600, Dennise Hoes, MD   DULoxetine  (CYMBALTA ) DR capsule 60 mg, 60 mg, Oral, Daily, Gherghe, Costin M, MD   HYDROcodone -acetaminophen  (NORCO/VICODIN) 5-325 MG per tablet 1 tablet, 1 tablet, Oral, Q6H PRN, Gherghe, Costin M, MD   levETIRAcetam (KEPPRA) tablet 500 mg, 500 mg, Oral, Daily, Presli Fanguy, MD   NOREEN ON 04/08/2024] levETIRAcetam (KEPPRA) undiluted injection 500 mg, 500 mg, Intravenous, Once per day on Monday Wednesday Friday, Kimothy Kishimoto, MD   levothyroxine  (  SYNTHROID ) tablet 175 mcg, 175 mcg, Oral, QAC breakfast, Gherghe, Costin M, MD   memantine  (NAMENDA ) tablet 10 mg, 10 mg, Oral, BID, Gherghe, Costin M, MD   ondansetron  (ZOFRAN ) tablet 4 mg, 4 mg, Oral, Q6H PRN **OR** ondansetron  (ZOFRAN ) injection 4 mg, 4 mg, Intravenous, Q6H PRN, Gherghe, Costin M, MD   sevelamer  carbonate (RENVELA ) packet 0.8 g, 0.8 g, Oral, TID WC, Gherghe, Costin M, MD  Current Outpatient Medications:    acetaminophen  (TYLENOL ) 500 MG tablet, Take 1,000 mg by mouth every 6 (six) hours as needed for mild pain (pain score 1-3)., Disp: , Rfl:    amLODipine  (NORVASC ) 5 MG tablet, Take 1 tablet by mouth daily., Disp:  , Rfl:    aspirin  81 MG chewable tablet, Chew 1 tablet (81 mg total) by mouth daily., Disp: 30 tablet, Rfl: 2   atorvastatin  (LIPITOR ) 80 MG tablet, TAKE 1 TABLET BY MOUTH ONCE DAILY AT  6  IN  THE  EVENING (Patient taking differently: Take 80 mg by mouth every evening. TAKE 1 TABLET BY MOUTH ONCE DAILY AT  6  IN  THE  EVENING), Disp: 90 tablet, Rfl: 3   buPROPion  (WELLBUTRIN  SR) 150 MG 12 hr tablet, Take 1 tablet (150 mg total) by mouth 2 (two) times daily., Disp: 180 tablet, Rfl: 3   carvedilol  (COREG ) 6.25 MG tablet, Take 6.25 mg by mouth 2 (two) times daily with a meal., Disp: , Rfl:    cephALEXin  (KEFLEX ) 500 MG capsule, Take 1 capsule (500 mg total) by mouth 4 (four) times daily for 7 days. (Patient taking differently: Take 500 mg by mouth daily.), Disp: 28 capsule, Rfl: 0   DULoxetine  (CYMBALTA ) 60 MG capsule, Take 1 capsule (60 mg total) by mouth daily., Disp: 90 capsule, Rfl: 3   iron sucrose 100 mg in sodium chloride  0.9 % 100 mL, Inject 100 mg into the vein every dialysis (low iron)., Disp: , Rfl:    levothyroxine  (SYNTHROID ) 175 MCG tablet, Take 175 mcg by mouth daily before breakfast., Disp: , Rfl:    lidocaine -prilocaine  (EMLA ) cream, Apply 1 Application topically daily as needed (HD)., Disp: , Rfl:    memantine  (NAMENDA ) 10 MG tablet, Take 1 tablet (10 mg total) by mouth 2 (two) times daily., Disp: 60 tablet, Rfl: 11   Methoxy PEG-Epoetin Beta (MIRCERA) 200 MCG/0.3ML SOSY, 200 mcg by Implant route daily as needed (low RBC at dialysis). Mircera, Disp: , Rfl:    OVER THE COUNTER MEDICATION, Apply 1 application  topically See admin instructions. Zinc barrier cream- apply to buttocks with each incontinence episode., Disp: , Rfl:    Potassium Chloride  ER 20 MEQ TBCR, Take 1 tablet by mouth daily., Disp: , Rfl:    apixaban  (ELIQUIS ) 5 MG TABS tablet, Take 1 tablet (5 mg total) by mouth 2 (two) times daily. (Patient not taking: Reported on 04/06/2024), Disp: 180 tablet, Rfl: 1   nitroGLYCERIN   (NITROSTAT ) 0.4 MG SL tablet, Place 0.4 mg under the tongue every 5 (five) minutes x 3 doses as needed for chest pain. (Patient not taking: Reported on 04/06/2024), Disp: , Rfl:    sevelamer  carbonate (RENVELA ) 0.8 g PACK packet, Take 0.8 g by mouth 3 (three) times daily with meals. (Patient not taking: Reported on 04/06/2024), Disp: , Rfl:    sulfamethoxazole-trimethoprim (BACTRIM DS) 800-160 MG tablet, Take 1 tablet by mouth daily. (Patient not taking: Reported on 04/03/2024), Disp: 7 tablet, Rfl: 0  Labs and Diagnostic Imaging   CBC:  Recent Labs  Lab  04/06/24 1251 04/07/24 0327  WBC 6.5 4.2  NEUTROABS 5.2  --   HGB 9.8* 8.7*  HCT 31.0* 27.4*  MCV 92.0 89.8  PLT 249 229    Basic Metabolic Panel:  Lab Results  Component Value Date   NA 137 04/07/2024   K 3.0 (L) 04/07/2024   CO2 28 04/07/2024   GLUCOSE 74 04/07/2024   BUN 24 (H) 04/07/2024   CREATININE 5.42 (H) 04/07/2024   CALCIUM  8.5 (L) 04/07/2024   GFRNONAA 8 (L) 04/07/2024   GFRAA 43 (L) 02/05/2020   Lipid Panel:  Lab Results  Component Value Date   LDLCALC 76 11/29/2023   HgbA1c:  Lab Results  Component Value Date   HGBA1C 5.7 (H) 11/29/2023   Urine Drug Screen:     Component Value Date/Time   LABOPIA POSITIVE (A) 04/06/2024 1830   COCAINSCRNUR NONE DETECTED 04/06/2024 1830   LABBENZ NONE DETECTED 04/06/2024 1830   AMPHETMU NONE DETECTED 04/06/2024 1830   THCU NONE DETECTED 04/06/2024 1830   LABBARB NONE DETECTED 04/06/2024 1830    Alcohol  Level     Component Value Date/Time   ETH <15 04/06/2024 1251   INR  Lab Results  Component Value Date   INR 1.2 04/06/2024   APTT  Lab Results  Component Value Date   APTT 82 (H) 04/06/2024   AED levels: No results found for: PHENYTOIN, ZONISAMIDE, LAMOTRIGINE, LEVETIRACETA  CT Head without contrast(Personally reviewed): 1. No acute intracranial abnormality. 2. 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 3. Stable lacunar infarcts in both thalami. 4. Moderately advanced periventricular and subcortical white matter hypoattenuation, stable. 5. Atherosclerotic calcifications in the cavernous carotid arteries bilaterally. No hyperdense vessel present.  MRI Brain(Personally reviewed): 1. No acute intracranial abnormality. 2. Chronic severe small vessel disease stable since June.  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 TNK not administered 2/2 prior ICH. CTA not performed bc patient would not be candidate for thrombectomy even if LVO were found 2/2 poor functional status (bedbound at baseline with dementia). After administration of 2mg  IM ativan  patient improved and was able to have a conversation with EDP. Favor complex partial seizure. She was transferred to [facility] due to lack of EEG availabilty at [facility].  She was conversant when she arrived and started on Keppra. This AM, difficult to arouse, R facial droop but seems weaker on the left. No clinical seizure activity witnessed by staff or on evaluation today. MRI Brain this AM with no acute stroke.  Recommendations  - continue Keppra 500mg  daily with additional 500mg  on MWF due to ESRD on HD. - cEEG ordered. ______________________________________________________________________   Signed, Chriss Mannan, MD [facility]

## 2024-04-07 NOTE — Plan of Care (Signed)
   Problem: Education: Goal: Knowledge of General Education information will improve Description: Including pain rating scale, medication(s)/side effects and non-pharmacologic comfort measures Outcome: Progressing   Problem: Clinical Measurements: Goal: Ability to maintain clinical measurements within normal limits will improve Outcome: Progressing   Problem: Nutrition: Goal: Adequate nutrition will be maintained Outcome: Progressing

## 2024-04-07 NOTE — Progress Notes (Signed)
 Pt arrived from ED via stretcher with ED RN and tech with EEG machine.  This RN entered the room as NT was taking VS. Bedside monitor set up and called into CCMD.  Pt with bloody purewick and brief on arrival.  Peri care provided, brief removed, new purewick placed.  Hematuria noted in tubing and canister.  Skin check done, foam placed on sacrum for prevention.  Large abrasion vs hematoma to right shin.  Site is dry, scabbed, no drainage.  Picture taken for chart.  Pt then noted to be gazing to the right; not tracking with her eyes, not speaking, not following commands.  Questioning if patient was having a seizure.  Consulting Civil Engineer notified.  VS taken. Episode lasted a few minutes.  Pt then able to speak, told me her name but disoriented to other questions asked.  Pt was able to follow simple commands and squeezed my hand when asked.  EEG tech at the bedside, made aware of time of episode as button was not pressed.  Pt currently resting in bed, call bell in place, bed in lowest position with bed alarm on and fall bracelet applied to right wrist.

## 2024-04-07 NOTE — Care Management Obs Status (Signed)
 MEDICARE OBSERVATION STATUS NOTIFICATION   Patient Details  Name: AERIELLE STOKLOSA MRN: [id number] Date of Birth: 08-30-1955   Medicare Observation Status Notification Given:  Yes    Corean JAYSON Canary, RN 04/07/2024, 12:53 PM

## 2024-04-07 NOTE — Significant Event (Signed)
 Rapid Response Event Note   Reason for Call :  CBI- RN called for help with setting up CBI per urology order.   3-way catheter placed. Supplies to bedside and set up for irrigation. Charge and primary RN educated on CBI. Instructed to call back with any questions or concerns.    Nat Blunt, RN

## 2024-04-07 NOTE — Progress Notes (Signed)
 Patient currently in MRI and unavailable for EEG setup. Will complete eeg order once patient is back from MRI.

## 2024-04-07 NOTE — Progress Notes (Signed)
 Patient CBI was not flowing on assessment. This RN had another RN assess patient and Winter, MD (urology) gave verbal order to manually irrigate CBI. CBI manually irrigated successfully and CBI now working. 1st CBI bag that was scanned in the [facility] was not all the way empty upon assessment. For this RN's shift, input will be entered once my first scanned bag of CBI has been administered to reflect accurate I/O.

## 2024-04-07 NOTE — Progress Notes (Signed)
 PROGRESS NOTE  Jennifer [name]:[id number] DOB: [birth year] DOA: 04/06/2024 PCP: Randeen Laine LABOR, MD   LOS: 0 days   Brief Narrative / Interim history: 68 y.o. 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  Remains confused  Assesement and Plan: Principal problem Mental status changes, acute metabolic encephalopathy -etiology not entirely clear, CVA versus seizures.  Given response to Ativan , latter seems to be more plausible - Neurology consulted, appreciate input.  She was started on Keppra, continue - Continue seizure precautions - EEG pending this morning per neurology, MRI pending   Active problems Hemorrhagic cystitis - patient was diagnosed with a UTI, has completed Bactrim and currently is on Keflex  for persistent hematuria.  She has also been passing a significant amount of blood clots.  CT scan of the abdomen and pelvis done 3 days ago on her ED visit showed a 10.3 x 4.9 hyperdense mass versus hematoma/hemorrhagic material in the bladder.  Urology consulted as she is having persistent symptoms, appreciate input - Urinalysis still shows some bacteria, will start ceftriaxone , send urine cultures.  Urology consulted, CT showed moderate layering hemorrhage in the posterior bladder.   ESRD -nephrology consulted, appreciate input.  No acute HD needs currently, plan for HD tomorrow   History of PE-Eliquis  is on hold due to persistent hematuria   Vascular dementia -she has good days and bad days per patient's daughters.  Resume home meds    Essential hypertension -continue home medications   Hypothyroidism-resume Synthroid    Depression-continue home medications except Wellbutrin  which will be held per neurology   Hyperlipidemia-resume statin   Right shin wound-monitor, does not appear infected  Scheduled Meds:  amLODipine   5 mg Oral Daily   atorvastatin   80 mg Oral QPM   carvedilol   6.25 mg Oral BID WC   Chlorhexidine  Gluconate Cloth  6 each Topical Q0600   DULoxetine   60 mg Oral Daily   levETIRAcetam  500 mg Oral Daily   [START ON 04/08/2024] levETIRAcetam  500 mg Intravenous Once per day on Monday Wednesday Friday   levothyroxine   175 mcg Oral QAC breakfast   memantine   10 mg Oral BID   sevelamer  carbonate  0.8 g Oral TID WC   Continuous Infusions: PRN Meds:.acetaminophen  **OR** acetaminophen , HYDROcodone -acetaminophen , ondansetron  **OR** ondansetron  (ZOFRAN ) IV  Current Outpatient Medications  Medication Instructions   acetaminophen  (TYLENOL ) 1,000 mg, Every 6 hours PRN   amLODipine  (NORVASC ) 5 MG tablet 1 tablet, Daily   apixaban  (ELIQUIS ) 5 mg, Oral, 2 times daily   aspirin  81 mg, Oral, Daily   atorvastatin  (LIPITOR ) 80 MG tablet TAKE 1 TABLET BY MOUTH ONCE DAILY AT  6  IN  THE  EVENING   buPROPion  (WELLBUTRIN  SR) 150 mg, Oral, 2 times daily   carvedilol  (COREG ) 6.25 mg, 2 times daily with meals   cephALEXin  (KEFLEX ) 500 mg, Oral, 4 times daily   DULoxetine  (CYMBALTA ) 60 mg, Oral, Daily   iron sucrose 100 mg in sodium chloride  0.9 % 100 mL 100 mg, Every Dialysis   levothyroxine  (SYNTHROID ) 175 mcg, Daily before breakfast  lidocaine -prilocaine  (EMLA ) cream 1 Application, Daily PRN   memantine  (NAMENDA ) 10 mg, Oral, 2 times daily   Mircera 200 mcg, Daily PRN   nitroGLYCERIN  (NITROSTAT ) 0.4 mg, Every 5 min x3 PRN   OVER THE COUNTER MEDICATION 1 application , See admin instructions   Potassium Chloride  ER 20 MEQ TBCR 1 tablet, Daily   sevelamer  carbonate (RENVELA ) 0.8 g, 3 times daily with meals    sulfamethoxazole-trimethoprim (BACTRIM DS) 800-160 MG tablet 1 tablet, Oral, Daily    Diet Orders (From admission, onward)     Start     Ordered   04/06/24 1224  Diet NPO time specified  (Code Stroke (Thrombolytic / IR candidate emergent stroke workup.))  Diet effective now       Comments: NPO until swallow screen is complete   04/06/24 1223            DVT prophylaxis: SCDs Start: 04/06/24 2021   Lab Results  Component Value Date   PLT 229 04/07/2024      Code Status: Limited: Do not attempt resuscitation (DNR) -DNR-LIMITED -Do Not Intubate/DNI   Family Communication: No family at bedside  Status is: Observation The patient will require care spanning > 2 midnights and should be moved to inpatient because: Persistent hematuria, encephalopathy   Level of care: Telemetry Medical  Consultants:  Neurology Urology Nephrology  Objective: Vitals:   04/07/24 0500 04/07/24 0600 04/07/24 0700 04/07/24 0723  BP: (!) 153/61 (!) 153/57 (!) 156/53   Pulse:   74   Resp: 12 15 10    Temp:    97.6 F (36.4 C)  TempSrc:    Oral  SpO2:   95%   Weight:      Height:       No intake or output data in the 24 hours ending 04/07/24 0937 Wt Readings from Last 3 Encounters:  04/06/24 82.5 kg  04/03/24 85.3 kg  03/12/24 90.7 kg    Examination:  Constitutional: NAD Eyes: no scleral icterus ENMT: Mucous membranes are moist.  Neck: normal, supple Respiratory: clear to auscultation bilaterally, no wheezing, no crackles.  Cardiovascular: Regular rate and rhythm, no murmurs / rubs / gallops. No LE edema.  Abdomen: non distended, no tenderness. Bowel sounds positive.  Musculoskeletal: no clubbing / cyanosis.    Data Reviewed: I have independently reviewed following labs and imaging studies   CBC Recent Labs  Lab 04/03/24 1753 04/06/24 1251 04/07/24 0327  WBC 6.6 6.5 4.2  HGB 9.6* 9.8* 8.7*  HCT 30.9* 31.0* 27.4*  PLT 233 249 229  MCV 90.4 92.0 89.8  MCH 28.1 29.1 28.5   MCHC 31.1 31.6 31.8  RDW 14.5 14.8 14.8  LYMPHSABS  --  0.8  --   MONOABS  --  0.3  --   EOSABS  --  0.1  --   BASOSABS  --  0.0  --     Recent Labs  Lab 04/03/24 1753 04/06/24 1251 04/07/24 0327  NA 137 138 137  K 3.2* 3.8 3.0*  CL 95* 94* 96*  CO2 30 25 28   GLUCOSE 98 157* 74  BUN 18 23 24*  CREATININE 4.42* 4.53* 5.42*  CALCIUM  9.1 9.1 8.5*  AST 17 18 13*  ALT 6 6 8   ALKPHOS 83 100 70  BILITOT 0.4 0.4 0.8  ALBUMIN  3.7 3.8 2.9*  MG  --   --  1.8  INR  --  1.2  --     ------------------------------------------------------------------------------------------------------------------ No results for input(s): CHOL, HDL,  LDLCALC, TRIG, CHOLHDL, LDLDIRECT in the last 72 hours.  Lab Results  Component Value Date   HGBA1C 5.7 (H) 11/29/2023   ------------------------------------------------------------------------------------------------------------------ No results for input(s): TSH, T4TOTAL, T3FREE, THYROIDAB in the last 72 hours.  Invalid input(s): FREET3  Cardiac Enzymes No results for input(s): CKMB, TROPONINI, MYOGLOBIN in the last 168 hours.  Invalid input(s): CK ------------------------------------------------------------------------------------------------------------------    Component Value Date/Time   BNP 59.6 12/07/2023 1700    CBG: No results for input(s): GLUCAP in the last 168 hours.  Recent Results (from the past 240 hours)  Resp panel by RT-PCR (RSV, Flu A&B, Covid) Anterior Nasal Swab     Status: None   Collection Time: 04/03/24  5:24 PM   Specimen: Anterior Nasal Swab  Result Value Ref Range Status   SARS Coronavirus 2 by RT PCR NEGATIVE NEGATIVE Final    Comment: (NOTE) SARS-CoV-2 target nucleic acids are NOT DETECTED.  The SARS-CoV-2 RNA is generally detectable in upper respiratory specimens during the acute phase of infection. The lowest concentration of SARS-CoV-2 viral copies this assay can detect  is 138 copies/mL. A negative result does not preclude SARS-Cov-2 infection and should not be used as the sole basis for treatment or other patient management decisions. A negative result may occur with  improper specimen collection/handling, submission of specimen other than nasopharyngeal swab, presence of viral mutation(s) within the areas targeted by this assay, and inadequate number of viral copies(<138 copies/mL). A negative result must be combined with clinical observations, patient history, and epidemiological information. The expected result is Negative.  Fact Sheet for Patients:  bloggercourse.com  Fact Sheet for Healthcare Providers:  seriousbroker.it  This test is no t yet approved or cleared by the [place]  FDA and  has been authorized for detection and/or diagnosis of SARS-CoV-2 by FDA under an Emergency Use Authorization (EUA). This EUA will remain  in effect (meaning this test can be used) for the duration of the COVID-19 declaration under Section 564(b)(1) of the Act, 21 U.S.C.section 360bbb-3(b)(1), unless the authorization is terminated  or revoked sooner.       Influenza A by PCR NEGATIVE NEGATIVE Final   Influenza B by PCR NEGATIVE NEGATIVE Final    Comment: (NOTE) The Xpert Xpress SARS-CoV-2/FLU/RSV plus assay is intended as an aid in the diagnosis of influenza from Nasopharyngeal swab specimens and should not be used as a sole basis for treatment. Nasal washings and aspirates are unacceptable for Xpert Xpress SARS-CoV-2/FLU/RSV testing.  Fact Sheet for Patients: bloggercourse.com  Fact Sheet for Healthcare Providers: seriousbroker.it  This test is not yet approved or cleared by the [place]  FDA and has been authorized for detection and/or diagnosis of SARS-CoV-2 by FDA under an Emergency Use Authorization (EUA). This EUA will remain in effect  (meaning this test can be used) for the duration of the COVID-19 declaration under Section 564(b)(1) of the Act, 21 U.S.C. section 360bbb-3(b)(1), unless the authorization is terminated or revoked.     Resp Syncytial Virus by PCR NEGATIVE NEGATIVE Final    Comment: (NOTE) Fact Sheet for Patients: bloggercourse.com  Fact Sheet for Healthcare Providers: seriousbroker.it  This test is not yet approved or cleared by the [place]  FDA and has been authorized for detection and/or diagnosis of SARS-CoV-2 by FDA under an Emergency Use Authorization (EUA). This EUA will remain in effect (meaning this test can be used) for the duration of the COVID-19 declaration under Section 564(b)(1) of the Act, 21 U.S.C. section 360bbb-3(b)(1), unless the authorization is terminated  or revoked.  Performed at [facility], [address] [place], [place] [address]      Radiology Studies: CT HEMATURIA WORKUP Result Date: 04/06/2024 EXAM: CT UROGRAM 04/06/2024 11:51:28 PM TECHNIQUE: CT of the abdomen and pelvis was performed without and with the administration of 100 mL of iohexol  (OMNIPAQUE ) 350 MG/ML injection as per CT urogram protocol. Multiplanar reformatted images as well as MIP urogram images are provided for review. Automated exposure control, iterative reconstruction, and/or weight based adjustment of the mA/kV was utilized to reduce the radiation dose to as low as reasonably achievable. COMPARISON: CT abdomen/pelvis dated 04/03/2024. CLINICAL HISTORY: FINDINGS: LOWER CHEST: Mild linear scarring/atelectasis at the left lung base. LIVER: The liver is unremarkable. GALLBLADDER AND BILE DUCTS: Status post cholecystectomy. No biliary ductal dilatation. SPLEEN: No acute abnormality. PANCREAS: No acute abnormality. ADRENAL GLANDS: No acute abnormality. KIDNEYS, URETERS AND BLADDER: Subcentimeter left renal cyst (image 21), benign. Per consensus, no follow-up  is needed for simple Bosniak type 1 and 2 renal cysts, unless the patient has a malignancy history or risk factors. No stones in the kidneys or ureters. No hydronephrosis. No perinephric or periureteral stranding. Mildly thick-walled bladder, correlate for cystitis. Moderate layering hemorrhage in the posterior bladder, mildly improved. No focal bladder mass on CT. GI AND BOWEL: Stomach demonstrates no acute abnormality. Normal appendix (image 63). Sigmoid diverticulosis, without evidence of diverticulitis. There is no bowel obstruction. PERITONEUM AND RETROPERITONEUM: No ascites. No free air. VASCULATURE: Aorta is normal in caliber. Atherosclerotic calcifications of the abdominal aorta and branch vessels, although patent. LYMPH NODES: No lymphadenopathy. REPRODUCTIVE ORGANS: No acute abnormality. BONES AND SOFT TISSUES: Mild degenerative changes of the visualized THORACOLUMBAR spine. No focal soft tissue abnormality. IMPRESSION: 1. Moderate layering hemorrhage in the posterior bladder, mildly improved. 2. Mild bladder wall thickening, correlate for cystitis 3. No focal bladder mass on CT. Regardless, consider cystoscopy for further evaluation. Electronically signed by: Pinkie Pebbles MD 04/06/2024 11:59 PM EDT RP Workstation: [id number]   CT HEAD CODE STROKE WO CONTRAST (LKW 0-4.5h, LVO 0-24h) Result Date: 04/06/2024 EXAM: CT HEAD WITHOUT 04/06/2024 12:33:24 PM TECHNIQUE: CT of the head was performed without the administration of intravenous contrast. Automated exposure control, iterative reconstruction, and/or weight based adjustment of the mA/kV was utilized to reduce the radiation dose to as low as reasonably achievable. COMPARISON: CT head without contrast 01/24/2024. MR head without contrast 11/27/2023. CLINICAL HISTORY: FINDINGS: BRAIN AND VENTRICLES: No acute intracranial hemorrhage. No mass effect or midline shift. No extra-axial fluid collection. No evidence of acute infarct. Remote infarcts again  noted in the cerebellum bilaterally. Remote lacunar infarcts are present in the right basal ganglia and corona radiata without significant interval change. Remote lacunar infarcts are stable in both thalami. Periventricular and subcortical white matter hypoattenuation is moderately advanced for age, stable. Atherosclerotic calcifications are present in the cavernous carotid arteries bilaterally. No hyperdense vessel is present. No hydrocephalus. ORBITS: Bilateral lens replacements are noted. The globes and orbits are otherwise within normal limits. SINUSES AND MASTOIDS: No acute abnormality. SOFT TISSUES AND SKULL: No acute skull fracture. No acute soft tissue abnormality. IMPRESSION: 1. No acute intracranial abnormality. 2. Stable remote infarcts in the cerebellum bilaterally, right basal ganglia, and corona radiata. 3. Stable lacunar infarcts in both thalami. 4. Moderately advanced periventricular and subcortical white matter hypoattenuation, stable. 5. Atherosclerotic calcifications in the cavernous carotid arteries bilaterally. No hyperdense vessel present. Electronically signed by: Lonni Necessary MD 04/06/2024 12:39 PM EDT RP Workstation: [id number]     Nilda Fendt, MD,  PhD [facility]  Between 7 am - 7 pm I am available, please contact me via Amion (for emergencies) or Securechat (non urgent messages)  Between 7 pm - 7 am I am not available, please contact night coverage MD/APP via Amion

## 2024-04-08 ENCOUNTER — Inpatient Hospital Stay (HOSPITAL_COMMUNITY)

## 2024-04-08 DIAGNOSIS — F0390 Unspecified dementia without behavioral disturbance: Secondary | ICD-10-CM

## 2024-04-08 DIAGNOSIS — R569 Unspecified convulsions: Secondary | ICD-10-CM | POA: Diagnosis not present

## 2024-04-08 DIAGNOSIS — G9340 Encephalopathy, unspecified: Secondary | ICD-10-CM

## 2024-04-08 LAB — IRON AND TIBC
Iron: 78 ug/dL (ref 28–170)
Saturation Ratios: 39 % — ABNORMAL HIGH (ref 10.4–31.8)
TIBC: 200 ug/dL — ABNORMAL LOW (ref 250–450)
UIBC: 122 ug/dL

## 2024-04-08 LAB — GLUCOSE, CAPILLARY
Glucose-Capillary: 125 mg/dL — ABNORMAL HIGH (ref 70–99)
Glucose-Capillary: 150 mg/dL — ABNORMAL HIGH (ref 70–99)
Glucose-Capillary: 107 mg/dL — ABNORMAL HIGH (ref 70–99)
Glucose-Capillary: 206 mg/dL — ABNORMAL HIGH (ref 70–99)

## 2024-04-08 LAB — CBG MONITORING, ED: Glucose-Capillary: 106 mg/dL — ABNORMAL HIGH (ref 70–99)

## 2024-04-08 LAB — FERRITIN: Ferritin: 799 ng/mL — ABNORMAL HIGH (ref 11–307)

## 2024-04-08 MED ORDER — SODIUM CHLORIDE 0.9 % IR SOLN: 3000.0000 mL | Status: DC

## 2024-04-08 MED ORDER — HYDROMORPHONE HCL 1 MG/ML IJ SOLN
0.2500 mg | INTRAMUSCULAR | Status: DC | PRN
  Administered 2024-04-08: 0.25 mg via INTRAVENOUS
  Filled 2024-04-08: qty 0.5

## 2024-04-08 NOTE — Progress Notes (Signed)
 SLP Cancellation Note  Patient Details Name: DYMIN DINGLEDINE MRN: [id number] DOB: [birth year]   Cancelled treatment:       Reason Eval/Treat Not Completed: Patient at procedure or test/unavailable (HD). Will f/u as able.    Leita SAILOR., M.A. CCC-SLP [facility] Services Office: [phone]  Secure chat preferred  04/08/2024, 2:48 PM

## 2024-04-08 NOTE — Progress Notes (Signed)
 Pt. Came in bed assisted by floor nurse Lucie Shuck, RN and the porter. Patient is in continuous bladder irrigation but as per the floor nurse, AP ordered to clamp it and just open it when it starts to get bloody and pt. Feels pain. Pt. Was also off with EEG as per AP ordered as per floor nurse. Informed AP about the use of the 4k bath and ordered just to change the bath after the blood result will be in. PT. Is on mittens on both hands.  Pt. Only responsive with voice, pain, and response with growl pre HD.  Tx duration time: 3 hours UF goal: 2L  Access used: Right AVF Access issue: Buttonhole needle is unavailable, Informed AP that we will used 15g needle and stick it away from the buttonhole and he agreed.  Pt. Orientation during HD: Pt. Awake,responsive, and able to talk and respond to question. Pt. Verbalized that she feels comfortable when asked and no pain as of the moment.  Endorsed to [id number], CHAR[facility] FUNDRAISER for continuity of care.  Aja Whitehair Rubi B. Laylonie Marzec, RN, BSN [facility]

## 2024-04-08 NOTE — Progress Notes (Signed)
 Manually irrigated foley and removed several large clots, CBI then began working well again.

## 2024-04-08 NOTE — Progress Notes (Signed)
 PROGRESS NOTE  Jennifer [name] FMW:[id number] DOB: Apr 25, 1956 DOA: 04/06/2024 PCP: Randeen Laine LABOR, MD   LOS: 1 day   Brief Narrative / Interim history: 68 y.o. 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  Has been agitated overnight  Assesement and Plan: Principal problem Mental status changes, acute metabolic encephalopathy -etiology not entirely clear, CVA versus seizures.  Given response to Ativan , latter seems to be more plausible - Neurology consulted, appreciate input.  She was started on Keppra, continue - Continue seizure precautions, LTM EEG ongoing today - Monitor of the brain negative for acute findings   Active problems Hemorrhagic cystitis - patient was diagnosed with a UTI, has completed Bactrim and currently is on Keflex  for persistent hematuria.  She has also been passing a significant amount of blood clots.  Urology consulted, getting CBI right now, hematuria clearing. - Await urine cultures, continue ceftriaxone    ESRD -nephrology consulted, appreciate input.  HD today   History of PE-Eliquis  is on hold due to persistent hematuria, will resume once okay with urology   Vascular dementia -she has good days and bad days per patient's daughters.  Resume home meds   Essential hypertension -continue home medications, somewhat hypotensive this morning as she is agitated.  Monitor trend   Hypothyroidism-resume Synthroid    Depression-continue home medications except Wellbutrin  which will be held per neurology   Hyperlipidemia-resume statin   Right shin  wound-monitor, does not appear infected  Scheduled Meds:  amLODipine   5 mg Oral Daily   atorvastatin   80 mg Oral QPM   carvedilol   6.25 mg Oral BID WC   Chlorhexidine  Gluconate Cloth  6 each Topical Q0600   DULoxetine   60 mg Oral Daily   insulin  aspart  0-6 Units Subcutaneous TID WC   levETIRAcetam  500 mg Oral Daily   levETIRAcetam  500 mg Intravenous Once per day on Monday Wednesday Friday   levothyroxine   175 mcg Oral QAC breakfast   memantine   10 mg Oral BID   Continuous Infusions:  cefTRIAXone  (ROCEPHIN )  IV 1 g (04/08/24 0838)   sodium chloride  irrigation     PRN Meds:.acetaminophen  **OR** acetaminophen , HYDROcodone -acetaminophen , HYDROmorphone  (DILAUDID ) injection, ondansetron  **OR** ondansetron  (ZOFRAN ) IV  Current Outpatient Medications  Medication Instructions   acetaminophen  (TYLENOL ) 1,000 mg, Every 6 hours PRN   amLODipine  (NORVASC ) 5 MG tablet 1 tablet, Daily   apixaban  (ELIQUIS ) 5 mg, Oral, 2 times daily   aspirin  81 mg, Oral, Daily   atorvastatin  (LIPITOR ) 80 MG tablet TAKE 1 TABLET BY MOUTH ONCE DAILY AT  6  IN  THE  EVENING   buPROPion  (WELLBUTRIN  SR) 150 mg, Oral, 2 times daily   carvedilol  (COREG ) 6.25 mg, 2 times daily with meals   cephALEXin  (KEFLEX ) 500 mg, Oral, 4 times daily   DULoxetine  (CYMBALTA ) 60 mg, Oral, Daily   iron sucrose 100 mg in sodium chloride  0.9 % 100 mL 100 mg, Every Dialysis   levothyroxine  (SYNTHROID ) 175 mcg, Daily before breakfast   lidocaine -prilocaine  (EMLA ) cream 1 Application, Daily PRN   memantine  (NAMENDA ) 10 mg, Oral, 2 times  daily   Mircera 200 mcg, Daily PRN   nitroGLYCERIN  (NITROSTAT ) 0.4 mg, Every 5 min x3 PRN   OVER THE COUNTER MEDICATION 1 application , See admin instructions   Potassium Chloride  ER 20 MEQ TBCR 1 tablet, Daily   sevelamer  carbonate (RENVELA ) 0.8 g, 3 times daily with meals   sulfamethoxazole-trimethoprim (BACTRIM DS) 800-160 MG tablet 1 tablet, Oral, Daily    Diet Orders (From admission, onward)      Start     Ordered   04/07/24 1620  DIET - DYS 1 Room service appropriate? Yes; Fluid consistency: Thin  Diet effective now       Comments: Thin liquids  Question Answer Comment  Room service appropriate? Yes   Fluid consistency: Thin      04/07/24 1621            DVT prophylaxis: SCDs Start: 04/06/24 2021   Lab Results  Component Value Date   PLT 229 04/07/2024      Code Status: Limited: Do not attempt resuscitation (DNR) -DNR-LIMITED -Do Not Intubate/DNI   Family Communication: No family at bedside  Status is: Observation The patient will require care spanning > 2 midnights and should be moved to inpatient because: Persistent hematuria, encephalopathy   Level of care: Telemetry Medical  Consultants:  Neurology Urology Nephrology  Objective: Vitals:   04/07/24 2022 04/07/24 2330 04/08/24 0315 04/08/24 0744  BP: (!) 161/87 (!) 156/64 (!) 168/75 (!) 197/69  Pulse: 95 76 77 79  Resp: 16 17 14 16   Temp: 98.2 F (36.8 C) 98 F (36.7 C) 98.3 F (36.8 C) 98.1 F (36.7 C)  TempSrc: Oral Oral Axillary Oral  SpO2: 95% 100% 95% 98%  Weight:      Height:        Intake/Output Summary (Last 24 hours) at 04/08/2024 0923 Last data filed at 04/08/2024 0641 Gross per 24 hour  Intake 21530.03 ml  Output 77899 ml  Net -569.97 ml   Wt Readings from Last 3 Encounters:  04/06/24 82.5 kg  04/03/24 85.3 kg  03/12/24 90.7 kg    Examination:  Constitutional: NAD Eyes: lids and conjunctivae normal, no scleral icterus ENMT: mmm Neck: normal, supple Respiratory: clear to auscultation bilaterally, no wheezing, no crackles.  Cardiovascular: Regular rate and rhythm, no murmurs / rubs / gallops.  Abdomen: soft, no distention, no tenderness. Bowel sounds positive.    Data Reviewed: I have independently reviewed following labs and imaging studies   CBC Recent Labs  Lab 04/03/24 1753 04/06/24 1251 04/07/24 0327  WBC 6.6 6.5 4.2  HGB 9.6* 9.8* 8.7*  HCT 30.9*  31.0* 27.4*  PLT 233 249 229  MCV 90.4 92.0 89.8  MCH 28.1 29.1 28.5  MCHC 31.1 31.6 31.8  RDW 14.5 14.8 14.8  LYMPHSABS  --  0.8  --   MONOABS  --  0.3  --   EOSABS  --  0.1  --   BASOSABS  --  0.0  --     Recent Labs  Lab 04/03/24 1753 04/06/24 1251 04/07/24 0327  NA 137 138 137  K 3.2* 3.8 3.0*  CL 95* 94* 96*  CO2 30 25 28   GLUCOSE 98 157* 74  BUN 18 23 24*  CREATININE 4.42* 4.53* 5.42*  CALCIUM  9.1 9.1 8.5*  AST 17 18 13*  ALT 6 6 8   ALKPHOS 83 100 70  BILITOT 0.4 0.4 0.8  ALBUMIN  3.7 3.8 2.9*  MG  --   --  1.8  INR  --  1.2  --     ------------------------------------------------------------------------------------------------------------------ No results for input(s): CHOL, HDL, LDLCALC, TRIG, CHOLHDL, LDLDIRECT in the last 72 hours.  Lab Results  Component Value Date   HGBA1C 5.7 (H) 11/29/2023   ------------------------------------------------------------------------------------------------------------------ No results for input(s): TSH, T4TOTAL, T3FREE, THYROIDAB in the last 72 hours.  Invalid input(s): FREET3  Cardiac Enzymes No results for input(s): CKMB, TROPONINI, MYOGLOBIN in the last 168 hours.  Invalid input(s): CK ------------------------------------------------------------------------------------------------------------------    Component Value Date/Time   BNP 59.6 12/07/2023 1700    CBG: Recent Labs  Lab 04/07/24 1625 04/07/24 1703 04/07/24 2128 04/08/24 0650  GLUCAP 68* 85 175* 125*    Recent Results (from the past 240 hours)  Resp panel by RT-PCR (RSV, Flu A&B, Covid) Anterior Nasal Swab     Status: None   Collection Time: 04/03/24  5:24 PM   Specimen: Anterior Nasal Swab  Result Value Ref Range Status   SARS Coronavirus 2 by RT PCR NEGATIVE NEGATIVE Final    Comment: (NOTE) SARS-CoV-2 target nucleic acids are NOT DETECTED.  The SARS-CoV-2 RNA is generally detectable in upper  respiratory specimens during the acute phase of infection. The lowest concentration of SARS-CoV-2 viral copies this assay can detect is 138 copies/mL. A negative result does not preclude SARS-Cov-2 infection and should not be used as the sole basis for treatment or other patient management decisions. A negative result may occur with  improper specimen collection/handling, submission of specimen other than nasopharyngeal swab, presence of viral mutation(s) within the areas targeted by this assay, and inadequate number of viral copies(<138 copies/mL). A negative result must be combined with clinical observations, patient history, and epidemiological information. The expected result is Negative.  Fact Sheet for Patients:  bloggercourse.com  Fact Sheet for Healthcare Providers:  seriousbroker.it  This test is no t yet approved or cleared by the [place]  FDA and  has been authorized for detection and/or diagnosis of SARS-CoV-2 by FDA under an Emergency Use Authorization (EUA). This EUA will remain  in effect (meaning this test can be used) for the duration of the COVID-19 declaration under Section 564(b)(1) of the Act, 21 U.S.C.section 360bbb-3(b)(1), unless the authorization is terminated  or revoked sooner.       Influenza A by PCR NEGATIVE NEGATIVE Final   Influenza B by PCR NEGATIVE NEGATIVE Final    Comment: (NOTE) The Xpert Xpress SARS-CoV-2/FLU/RSV plus assay is intended as an aid in the diagnosis of influenza from Nasopharyngeal swab specimens and should not be used as a sole basis for treatment. Nasal washings and aspirates are unacceptable for Xpert Xpress SARS-CoV-2/FLU/RSV testing.  Fact Sheet for Patients: bloggercourse.com  Fact Sheet for Healthcare Providers: seriousbroker.it  This test is not yet approved or cleared by the [place]  FDA and has been  authorized for detection and/or diagnosis of SARS-CoV-2 by FDA under an Emergency Use Authorization (EUA). This EUA will remain in effect (meaning this test can be used) for the duration of the COVID-19 declaration under Section 564(b)(1) of the Act, 21 U.S.C. section 360bbb-3(b)(1), unless the authorization is terminated or revoked.     Resp Syncytial Virus by PCR NEGATIVE NEGATIVE Final    Comment: (NOTE) Fact Sheet for Patients: bloggercourse.com  Fact Sheet for Healthcare Providers: seriousbroker.it  This test is not yet approved or cleared by the [place]  FDA and has been authorized for detection and/or diagnosis of SARS-CoV-2 by FDA under an Emergency Use Authorization (EUA). This EUA will remain in effect (meaning this test can  be used) for the duration of the COVID-19 declaration under Section 564(b)(1) of the Act, 21 U.S.C. section 360bbb-3(b)(1), unless the authorization is terminated or revoked.  Performed at [facility], [address], [place], [place] [address]   Urine Culture (for pregnant, neutropenic or urologic patients or patients with an indwelling urinary catheter)     Status: Abnormal (Preliminary result)   Collection Time: 04/06/24  6:30 PM   Specimen: Urine, Clean Catch  Result Value Ref Range Status   Specimen Description URINE, CLEAN CATCH  Final   Special Requests NONE  Final   Culture (A)  Final    >=100,000 COLONIES/mL KLEBSIELLA PNEUMONIAE 50,000 COLONIES/mL ESCHERICHIA COLI CULTURE REINCUBATED FOR BETTER GROWTH Performed at [facility], [address], [place], [place] [address]    Report Status PENDING  Incomplete     Radiology Studies: Overnight EEG with video Result Date: 04/08/2024 Shelton Arlin KIDD, MD     04/08/2024  9:02 AM Patient Name: Niquita SECORD MRN: [id number] Epilepsy Attending: Arlin KIDD Shelton Referring Physician/Provider: Judithe Rocky BROCKS, NP Duration: 04/07/2024 1138  to 04/08/2024 0845 Patient history: 68yo F with right gaze deviation and decreased responsiveness. EEG to evaluate for seizure Level of alertness: Awake, asleep AEDs during EEG study: LEV Technical aspects: This EEG study was done with scalp electrodes positioned according to the 10-20 International system of electrode placement. Electrical activity was reviewed with band pass filter of 1-70Hz , sensitivity of 7 uV/mm, display speed of 71mm/sec with a 60Hz  notched filter applied as appropriate. EEG data were recorded continuously and digitally stored.  Video monitoring was available and reviewed as appropriate. Description: EEG showed continuous generalized 3 to 6 Hz theta-delta slowing. Hyperventilation and photic stimulation were not performed.   ABNORMALITY - Continuous slow, generalized IMPRESSION: This study is suggestive of generalized non specific cerebral dysfunction (encephalopathy). No seizures or epileptiform discharges were seen throughout the recording. Arlin KIDD Shelton   MR BRAIN WO CONTRAST Result Date: 04/07/2024 EXAM: MRI BRAIN WITHOUT CONTRAST 04/07/2024 11:36:24 AM TECHNIQUE: Multiplanar multisequence MRI of the head/brain was performed without the administration of intravenous contrast. COMPARISON: Head CT yesterday. Brain MRI 11/27/2023 and earlier. CLINICAL HISTORY: 68 year old female with new-onset seizure and code stroke presentation yesterday. FINDINGS: BRAIN AND VENTRICLES: No acute infarct. No intracranial hemorrhage. No mass. No midline shift. No hydrocephalus. The sella is unremarkable. Normal flow voids. Loss of the distal right vertebral artery flow void since the June MRI (series 10 image 4). However, this vessel flow void is maintained on coronal T2 images, such that the axial appearance is most likely artifact. Chronic severe small vessel disease. Extensive chronic white matter T2 and FLAIR hyperintensity. Chronic lacunar infarcts throughout the bilateral deep gray nuclei. Signal  abnormality throughout the pons plus chronic lacunar infarcts. Chronic cerebellar infarcts greater on the right. Numerous chronic microhemorrhages which are concentrated in the mesial temporal lobes, brainstem, and deep cerebellar nuclei. Trace chronic right lateral ventricle atrium hemosiderin is stable. No new signal abnormality is identified. ORBITS: No acute abnormality. SINUSES AND MASTOIDS: Mild bilateral mastoid effusions have increased, but the visible nasopharynx is negative. BONES AND SOFT TISSUES: Normal marrow signal. No acute soft tissue abnormality. IMPRESSION: 1. No acute intracranial abnormality. 2. Chronic severe small vessel disease stable since June. Electronically signed by: Helayne Hurst MD 04/07/2024 11:48 AM EDT RP Workstation: [id number]     Nilda Fendt, MD, PhD [facility]  Between 7 am - 7 pm I am available, please contact me via Amion (for  emergencies) or Securechat (non urgent messages)  Between 7 pm - 7 am I am not available, please contact night coverage MD/APP via Amion

## 2024-04-08 NOTE — Progress Notes (Signed)
 LTM EEG D/C'd. Patient had no skin break down noted.

## 2024-04-08 NOTE — Procedures (Signed)
 Patient Name: Jennifer [name]  MRN: [id number]  Epilepsy Attending: Arlin MALVA Krebs  Referring Physician/Provider: Judithe Rocky BROCKS, NP  Duration: 04/07/2024 1138 to 04/08/2024 1338  Patient history: 67yo F with right gaze deviation and decreased responsiveness. EEG to evaluate for seizure  Level of alertness: Awake, asleep  AEDs during EEG study: LEV  Technical aspects: This EEG study was done with scalp electrodes positioned according to the 10-20 International system of electrode placement. Electrical activity was reviewed with band pass filter of 1-70Hz , sensitivity of 7 uV/mm, display speed of 6mm/sec with a 60Hz  notched filter applied as appropriate. EEG data were recorded continuously and digitally stored.  Video monitoring was available and reviewed as appropriate.  Description: EEG showed continuous generalized 3 to 6 Hz theta-delta slowing. Hyperventilation and photic stimulation were not performed.     ABNORMALITY - Continuous slow, generalized  IMPRESSION: This study is suggestive of generalized non specific cerebral dysfunction (encephalopathy). No seizures or epileptiform discharges were seen throughout the recording.   Nica Friske O Latoria Dry

## 2024-04-08 NOTE — Consult Note (Signed)
 WOC Nurse Consult Note: on review of EMR family reached out to PCP on 10/20 with concerns for R lower leg hematoma after patient hit on a piece of furniture  Reason for Consult: R lower leg wound  Wound type: full thickness r/t trauma  Pressure Injury POA: NA  Measurement: see nursing flowsheet  Wound bed: black eschar  Drainage (amount, consistency, odor) see nursing flowsheet appears dry  Periwound: some ecchymosis noted  Dressing procedure/placement/frequency:  Cleanse R lower leg wound with soap and water, dry and apply Xeroform gauze ([place] [phone]) to wound bed daily and secure with silicone foam or ABD pad/kerlix roll gauze whichever is preferred.   Patient would benefit from ongoing management of this wound by PCP or referral to wound care center.  POC discussed with bedside nurse. WOC team will not follow.  Re-consult if further needs arise.   Thank you,    Powell Bar MSN, RN-BC, [employer]

## 2024-04-08 NOTE — Progress Notes (Signed)
 The patient completed dialysis treatment and goal met.  04/08/24 1836  Vitals  Temp 98 F (36.7 C)  Temp Source Axillary  BP 124/69  BP Location Left Leg  BP Method Automatic  Patient Position (if appropriate) Lying  Oxygen  Therapy  O2 Device Room Air  During Treatment Monitoring  Intra-Hemodialysis Comments Tx completed  Post Treatment  Dialyzer Clearance Lightly streaked  Hemodialysis Intake (mL) 0 mL  Liters Processed 73  Fluid Removed (mL) 2000 mL  Tolerated HD Treatment Yes  Post-Hemodialysis Comments see notes.  AVG/AVF Arterial Site Held (minutes) 7 minutes  AVG/AVF Venous Site Held (minutes) 7 minutes  Fistula / Graft Right Upper arm Arteriovenous fistula  Placement Date/Time: 06/28/22 0818   Placed prior to admission: No  Orientation: Right  Access Location: Upper arm  Access Type: Arteriovenous fistula  Site Condition No complications  Fistula / Graft Assessment Present;Thrill;Bruit  Status Deaccessed  Needle Size 15

## 2024-04-08 NOTE — Progress Notes (Signed)
 Subjective: Jennifer [name]. Still encephalopathic  ROS: Unable to obtain due to poor mental status  Examination  Vital signs in last 24 hours: Temp:  [96.8 F (36 C)-98.3 F (36.8 C)] 98.1 F (36.7 C) (10/27 0744) Pulse Rate:  [73-95] 79 (10/27 0744) Resp:  [14-20] 16 (10/27 0744) BP: (156-197)/(63-89) 197/69 (10/27 0744) SpO2:  [95 %-100 %] 98 % (10/27 0744)  General: lying in bed, NAD Neuro: Lethargic, reports to keep eyes closed, did not answer orientation questions, did squeeze my hand once but did not follow any other commands, left eye ptosis, no forced gaze deviation, PERRLA, does withdraw to noxious stimuli in all 4 extremities but appears to have some left hemiparesis   Basic Metabolic Panel: Recent Labs  Lab 04/03/24 1753 04/06/24 1251 04/07/24 0327  NA 137 138 137  K 3.2* 3.8 3.0*  CL 95* 94* 96*  CO2 30 25 28   GLUCOSE 98 157* 74  BUN 18 23 24*  CREATININE 4.42* 4.53* 5.42*  CALCIUM  9.1 9.1 8.5*  MG  --   --  1.8  PHOS  --   --  2.6    CBC: Recent Labs  Lab 04/03/24 1753 04/06/24 1251 04/07/24 0327  WBC 6.6 6.5 4.2  NEUTROABS  --  5.2  --   HGB 9.6* 9.8* 8.7*  HCT 30.9* 31.0* 27.4*  MCV 90.4 92.0 89.8  PLT 233 249 229     Coagulation Studies: Recent Labs    04/06/24 1251  LABPROT 15.8*  INR 1.2    Imaging personally reviewed  MRI brain without contrast 04/07/2024: No acute intracranial abnormality. Chronic severe small vessel disease stable since June.  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  Since then patient has been started on Keppra.  However continues to be encephalopathic.  No clear seizures on EEG.  MRI brain did not show any acute abnormality   Acute encephalopathy Plan likely prolonged postictal state in a patient with history of dementia (poor  neurocognitive reserve) and multiple other comorbidities. -Discussed with hospitalist about adjusting antihypertensives for goal blood pressure closer to normotension - Continue Keppra 500 mg daily with additional 500 mg on dialysis days after dialysis - DC LTM EEG as no seizures - Rest of the management per primary team    I personally spent a total of 36 minutes in the care of the patient today including getting/reviewing separately obtained history, performing a medically appropriate exam/evaluation, counseling and educating, placing orders, referring and communicating with other health care professionals, documenting clinical information in the EHR, independently interpreting results, and coordinating care.           Arlin Krebs Epilepsy [facility] For questions after 5pm please refer to AMION to reach the Neurologist on call

## 2024-04-08 NOTE — Progress Notes (Signed)
 Subjective: Patient sleeping on rounds.  I did not awaken her.  CBI on medium gtt. clear irrigant.  Clamped on rounds  Objective: Vital signs in last 24 hours: Temp:  [97.9 F (36.6 C)-98.3 F (36.8 C)] 97.9 F (36.6 C) (10/27 1205) Pulse Rate:  [72-95] 72 (10/27 1205) Resp:  [9-17] 9 (10/27 1205) BP: (156-197)/(64-89) 170/68 (10/27 1205) SpO2:  [95 %-100 %] 98 % (10/27 1205)  Assessment/Plan: 68 year old female with ESRD, CAD, CVA, dementia, T2DM, and hematuria  # Gross hematuria  lobulated hyperdensity in the bladder around 10 cm on 10/22.  Interval improvement in clot burden on repeat CT.  The urine is clear today.  Hand irrigate as needed. CBI clamped on rounds today.  If stays clear will proceed with voiding trial tomorrow.  No focal mass on CT Hematuria 10/25.  Outpt cystoscopy  Intake/Output from previous day: 10/26 0701 - 10/27 0700 In: 78469 [P.O.:120; IV Piggyback:100] Out: 22100 [Urine:22100]  Intake/Output this shift: Total I/O In: 360 [P.O.:360] Out: 7800 [Urine:7800]  Physical Exam:  General: Sleeping CV: No cyanosis Lungs: equal chest rise Abdomen: Soft, NTND, no rebound or guarding Gu: 39f Three-way Foley catheter in place draining clear irrigant  Lab Results: Recent Labs    04/06/24 1251 04/07/24 0327  HGB 9.8* 8.7*  HCT 31.0* 27.4*   BMET Recent Labs    04/06/24 1251 04/07/24 0327  NA 138 137  K 3.8 3.0*  CL 94* 96*  CO2 25 28  GLUCOSE 157* 74  BUN 23 24*  CREATININE 4.53* 5.42*  CALCIUM  9.1 8.5*  HGB 9.8* 8.7*  WBC 6.5 4.2     Studies/Results: Overnight EEG with video Result Date: 04/08/2024 Shelton Arlin KIDD, MD     04/08/2024  9:02 AM Patient Name: Jennifer [name] MRN: [id number] Epilepsy Attending: Arlin KIDD Shelton Referring Physician/Provider: Judithe Rocky BROCKS, NP Duration: 04/07/2024 1138 to 04/08/2024 0845 Patient history: 68yo F with right gaze deviation and decreased responsiveness. EEG to evaluate for seizure Level of  alertness: Awake, asleep AEDs during EEG study: LEV Technical aspects: This EEG study was done with scalp electrodes positioned according to the 10-20 International system of electrode placement. Electrical activity was reviewed with band pass filter of 1-70Hz , sensitivity of 7 uV/mm, display speed of 67mm/sec with a 60Hz  notched filter applied as appropriate. EEG data were recorded continuously and digitally stored.  Video monitoring was available and reviewed as appropriate. Description: EEG showed continuous generalized 3 to 6 Hz theta-delta slowing. Hyperventilation and photic stimulation were not performed.   ABNORMALITY - Continuous slow, generalized IMPRESSION: This study is suggestive of generalized non specific cerebral dysfunction (encephalopathy). No seizures or epileptiform discharges were seen throughout the recording. Arlin KIDD Shelton   MR BRAIN WO CONTRAST Result Date: 04/07/2024 EXAM: MRI BRAIN WITHOUT CONTRAST 04/07/2024 11:36:24 AM TECHNIQUE: Multiplanar multisequence MRI of the head/brain was performed without the administration of intravenous contrast. COMPARISON: Head CT yesterday. Brain MRI 11/27/2023 and earlier. CLINICAL HISTORY: 68 year old female with new-onset seizure and code stroke presentation yesterday. FINDINGS: BRAIN AND VENTRICLES: No acute infarct. No intracranial hemorrhage. No mass. No midline shift. No hydrocephalus. The sella is unremarkable. Normal flow voids. Loss of the distal right vertebral artery flow void since the June MRI (series 10 image 4). However, this vessel flow void is maintained on coronal T2 images, such that the axial appearance is most likely artifact. Chronic severe small vessel disease. Extensive chronic white matter T2 and FLAIR hyperintensity. Chronic lacunar infarcts throughout the  bilateral deep gray nuclei. Signal abnormality throughout the pons plus chronic lacunar infarcts. Chronic cerebellar infarcts greater on the right. Numerous chronic  microhemorrhages which are concentrated in the mesial temporal lobes, brainstem, and deep cerebellar nuclei. Trace chronic right lateral ventricle atrium hemosiderin is stable. No new signal abnormality is identified. ORBITS: No acute abnormality. SINUSES AND MASTOIDS: Mild bilateral mastoid effusions have increased, but the visible nasopharynx is negative. BONES AND SOFT TISSUES: Normal marrow signal. No acute soft tissue abnormality. IMPRESSION: 1. No acute intracranial abnormality. 2. Chronic severe small vessel disease stable since June. Electronically signed by: Helayne Hurst MD 04/07/2024 11:48 AM EDT RP Workstation: [id number]   CT HEMATURIA WORKUP Result Date: 04/06/2024 EXAM: CT UROGRAM 04/06/2024 11:51:28 PM TECHNIQUE: CT of the abdomen and pelvis was performed without and with the administration of 100 mL of iohexol  (OMNIPAQUE ) 350 MG/ML injection as per CT urogram protocol. Multiplanar reformatted images as well as MIP urogram images are provided for review. Automated exposure control, iterative reconstruction, and/or weight based adjustment of the mA/kV was utilized to reduce the radiation dose to as low as reasonably achievable. COMPARISON: CT abdomen/pelvis dated 04/03/2024. CLINICAL HISTORY: FINDINGS: LOWER CHEST: Mild linear scarring/atelectasis at the left lung base. LIVER: The liver is unremarkable. GALLBLADDER AND BILE DUCTS: Status post cholecystectomy. No biliary ductal dilatation. SPLEEN: No acute abnormality. PANCREAS: No acute abnormality. ADRENAL GLANDS: No acute abnormality. KIDNEYS, URETERS AND BLADDER: Subcentimeter left renal cyst (image 21), benign. Per consensus, no follow-up is needed for simple Bosniak type 1 and 2 renal cysts, unless the patient has a malignancy history or risk factors. No stones in the kidneys or ureters. No hydronephrosis. No perinephric or periureteral stranding. Mildly thick-walled bladder, correlate for cystitis. Moderate layering hemorrhage in the posterior  bladder, mildly improved. No focal bladder mass on CT. GI AND BOWEL: Stomach demonstrates no acute abnormality. Normal appendix (image 63). Sigmoid diverticulosis, without evidence of diverticulitis. There is no bowel obstruction. PERITONEUM AND RETROPERITONEUM: No ascites. No free air. VASCULATURE: Aorta is normal in caliber. Atherosclerotic calcifications of the abdominal aorta and branch vessels, although patent. LYMPH NODES: No lymphadenopathy. REPRODUCTIVE ORGANS: No acute abnormality. BONES AND SOFT TISSUES: Mild degenerative changes of the visualized THORACOLUMBAR spine. No focal soft tissue abnormality. IMPRESSION: 1. Moderate layering hemorrhage in the posterior bladder, mildly improved. 2. Mild bladder wall thickening, correlate for cystitis 3. No focal bladder mass on CT. Regardless, consider cystoscopy for further evaluation. Electronically signed by: Pinkie Pebbles MD 04/06/2024 11:59 PM EDT RP Workstation: [id number]      LOS: 1 day   Ole Bourdon, NP [facility] Pager: [phone]  04/08/2024, 2:43 PM

## 2024-04-08 NOTE — Progress Notes (Signed)
 [facility] Progress Note   Subjective:   Somnolent on exam but just received pain meds. Unable to provide ROS.   Objective Vitals:   04/07/24 2022 04/07/24 2330 04/08/24 0315 04/08/24 0744  BP: (!) 161/87 (!) 156/64 (!) 168/75 (!) 197/69  Pulse: 95 76 77 79  Resp: 16 17 14 16   Temp: 98.2 F (36.8 C) 98 F (36.7 C) 98.3 F (36.8 C) 98.1 F (36.7 C)  TempSrc: Oral Oral Axillary Oral  SpO2: 95% 100% 95% 98%  Weight:      Height:       Physical Exam General: Sleeping, NAD Heart: RRR, no murmurs Lungs: CTA bilaterally, respirations unlabored Abdomen: Soft, non-distended, palpation deferred d/t pain Extremities: No edema b/l lower extremities Dialysis Access: RUE AVF + t/b  Additional Objective Labs: Basic Metabolic Panel: Recent Labs  Lab 04/03/24 1753 04/06/24 1251 04/07/24 0327  NA 137 138 137  K 3.2* 3.8 3.0*  CL 95* 94* 96*  CO2 30 25 28   GLUCOSE 98 157* 74  BUN 18 23 24*  CREATININE 4.42* 4.53* 5.42*  CALCIUM  9.1 9.1 8.5*  PHOS  --   --  2.6   Liver Function Tests: Recent Labs  Lab 04/03/24 1753 04/06/24 1251 04/07/24 0327  AST 17 18 13*  ALT 6 6 8   ALKPHOS 83 100 70  BILITOT 0.4 0.4 0.8  PROT 6.0* 6.3* 5.1*  ALBUMIN  3.7 3.8 2.9*   Recent Labs  Lab 04/03/24 1753  LIPASE 12   CBC: Recent Labs  Lab 04/03/24 1753 04/06/24 1251 04/07/24 0327  WBC 6.6 6.5 4.2  NEUTROABS  --  5.2  --   HGB 9.6* 9.8* 8.7*  HCT 30.9* 31.0* 27.4*  MCV 90.4 92.0 89.8  PLT 233 249 229   Blood Culture    Component Value Date/Time   SDES URINE, CLEAN CATCH 04/06/2024 1830   SPECREQUEST NONE 04/06/2024 1830   CULT (A) 04/06/2024 1830    >=100,000 COLONIES/mL KLEBSIELLA PNEUMONIAE 50,000 COLONIES/mL ESCHERICHIA COLI SUSCEPTIBILITIES TO FOLLOW Performed at [facility], [address], [place], [place] [address]    REPTSTATUS PENDING 04/06/2024 1830    Cardiac Enzymes: No results for input(s): CKTOTAL, CKMB, CKMBINDEX,  TROPONINI in the last 168 hours. CBG: Recent Labs  Lab 04/06/24 1223 04/07/24 1625 04/07/24 1703 04/07/24 2128 04/08/24 0650  GLUCAP 106* 68* 85 175* 125*   Iron Studies:  Recent Labs    04/08/24 0231  IRON 78  TIBC 200*  FERRITIN 799*   @lablastinr3 @ Studies/Results: Overnight EEG with video Result Date: 04/08/2024 Shelton Arlin KIDD, MD     04/08/2024  9:02 AM Patient Name: Jennifer [name] MRN: [id number] Epilepsy Attending: Arlin KIDD Shelton Referring Physician/Provider: Judithe Rocky BROCKS, NP Duration: 04/07/2024 1138 to 04/08/2024 0845 Patient history: 68yo F with right gaze deviation and decreased responsiveness. EEG to evaluate for seizure Level of alertness: Awake, asleep AEDs during EEG study: LEV Technical aspects: This EEG study was done with scalp electrodes positioned according to the 10-20 International system of electrode placement. Electrical activity was reviewed with band pass filter of 1-70Hz , sensitivity of 7 uV/mm, display speed of 47mm/sec with a 60Hz  notched filter applied as appropriate. EEG data were recorded continuously and digitally stored.  Video monitoring was available and reviewed as appropriate. Description: EEG showed continuous generalized 3 to 6 Hz theta-delta slowing. Hyperventilation and photic stimulation were not performed.   ABNORMALITY - Continuous slow, generalized IMPRESSION: This study is suggestive of generalized non specific  cerebral dysfunction (encephalopathy). No seizures or epileptiform discharges were seen throughout the recording. Arlin MALVA Krebs   MR BRAIN WO CONTRAST Result Date: 04/07/2024 EXAM: MRI BRAIN WITHOUT CONTRAST 04/07/2024 11:36:24 AM TECHNIQUE: Multiplanar multisequence MRI of the head/brain was performed without the administration of intravenous contrast. COMPARISON: Head CT yesterday. Brain MRI 11/27/2023 and earlier. CLINICAL HISTORY: 68 year old female with new-onset seizure and code stroke presentation yesterday. FINDINGS:  BRAIN AND VENTRICLES: No acute infarct. No intracranial hemorrhage. No mass. No midline shift. No hydrocephalus. The sella is unremarkable. Normal flow voids. Loss of the distal right vertebral artery flow void since the June MRI (series 10 image 4). However, this vessel flow void is maintained on coronal T2 images, such that the axial appearance is most likely artifact. Chronic severe small vessel disease. Extensive chronic white matter T2 and FLAIR hyperintensity. Chronic lacunar infarcts throughout the bilateral deep gray nuclei. Signal abnormality throughout the pons plus chronic lacunar infarcts. Chronic cerebellar infarcts greater on the right. Numerous chronic microhemorrhages which are concentrated in the mesial temporal lobes, brainstem, and deep cerebellar nuclei. Trace chronic right lateral ventricle atrium hemosiderin is stable. No new signal abnormality is identified. ORBITS: No acute abnormality. SINUSES AND MASTOIDS: Mild bilateral mastoid effusions have increased, but the visible nasopharynx is negative. BONES AND SOFT TISSUES: Normal marrow signal. No acute soft tissue abnormality. IMPRESSION: 1. No acute intracranial abnormality. 2. Chronic severe small vessel disease stable since June. Electronically signed by: Helayne Hurst MD 04/07/2024 11:48 AM EDT RP Workstation: [id number]   CT HEMATURIA WORKUP Result Date: 04/06/2024 EXAM: CT UROGRAM 04/06/2024 11:51:28 PM TECHNIQUE: CT of the abdomen and pelvis was performed without and with the administration of 100 mL of iohexol  (OMNIPAQUE ) 350 MG/ML injection as per CT urogram protocol. Multiplanar reformatted images as well as MIP urogram images are provided for review. Automated exposure control, iterative reconstruction, and/or weight based adjustment of the mA/kV was utilized to reduce the radiation dose to as low as reasonably achievable. COMPARISON: CT abdomen/pelvis dated 04/03/2024. CLINICAL HISTORY: FINDINGS: LOWER CHEST: Mild linear  scarring/atelectasis at the left lung base. LIVER: The liver is unremarkable. GALLBLADDER AND BILE DUCTS: Status post cholecystectomy. No biliary ductal dilatation. SPLEEN: No acute abnormality. PANCREAS: No acute abnormality. ADRENAL GLANDS: No acute abnormality. KIDNEYS, URETERS AND BLADDER: Subcentimeter left renal cyst (image 21), benign. Per consensus, no follow-up is needed for simple Bosniak type 1 and 2 renal cysts, unless the patient has a malignancy history or risk factors. No stones in the kidneys or ureters. No hydronephrosis. No perinephric or periureteral stranding. Mildly thick-walled bladder, correlate for cystitis. Moderate layering hemorrhage in the posterior bladder, mildly improved. No focal bladder mass on CT. GI AND BOWEL: Stomach demonstrates no acute abnormality. Normal appendix (image 63). Sigmoid diverticulosis, without evidence of diverticulitis. There is no bowel obstruction. PERITONEUM AND RETROPERITONEUM: No ascites. No free air. VASCULATURE: Aorta is normal in caliber. Atherosclerotic calcifications of the abdominal aorta and branch vessels, although patent. LYMPH NODES: No lymphadenopathy. REPRODUCTIVE ORGANS: No acute abnormality. BONES AND SOFT TISSUES: Mild degenerative changes of the visualized THORACOLUMBAR spine. No focal soft tissue abnormality. IMPRESSION: 1. Moderate layering hemorrhage in the posterior bladder, mildly improved. 2. Mild bladder wall thickening, correlate for cystitis 3. No focal bladder mass on CT. Regardless, consider cystoscopy for further evaluation. Electronically signed by: Pinkie Pebbles MD 04/06/2024 11:59 PM EDT RP Workstation: [id number]   CT HEAD CODE STROKE WO CONTRAST (LKW 0-4.5h, LVO 0-24h) Result Date: 04/06/2024 EXAM: CT HEAD WITHOUT 04/06/2024 12:33:24 PM TECHNIQUE:  CT of the head was performed without the administration of intravenous contrast. Automated exposure control, iterative reconstruction, and/or weight based adjustment of the  mA/kV was utilized to reduce the radiation dose to as low as reasonably achievable. COMPARISON: CT head without contrast 01/24/2024. MR head without contrast 11/27/2023. CLINICAL HISTORY: FINDINGS: BRAIN AND VENTRICLES: No acute intracranial hemorrhage. No mass effect or midline shift. No extra-axial fluid collection. No evidence of acute infarct. Remote infarcts again noted in the cerebellum bilaterally. Remote lacunar infarcts are present in the right basal ganglia and corona radiata without significant interval change. Remote lacunar infarcts are stable in both thalami. Periventricular and subcortical white matter hypoattenuation is moderately advanced for age, stable. Atherosclerotic calcifications are present in the cavernous carotid arteries bilaterally. No hyperdense vessel is present. No hydrocephalus. ORBITS: Bilateral lens replacements are noted. The globes and orbits are otherwise within normal limits. SINUSES AND MASTOIDS: No acute abnormality. SOFT TISSUES AND SKULL: No acute skull fracture. No acute soft tissue abnormality. IMPRESSION: 1. No acute intracranial abnormality. 2. Stable remote infarcts in the cerebellum bilaterally, right basal ganglia, and corona radiata. 3. Stable lacunar infarcts in both thalami. 4. Moderately advanced periventricular and subcortical white matter hypoattenuation, stable. 5. Atherosclerotic calcifications in the cavernous carotid arteries bilaterally. No hyperdense vessel present. Electronically signed by: Lonni Necessary MD 04/06/2024 12:39 PM EDT RP Workstation: [id number]   Medications:  cefTRIAXone  (ROCEPHIN )  IV 1 g (04/08/24 9161)   sodium chloride  irrigation      amLODipine   5 mg Oral Daily   atorvastatin   80 mg Oral QPM   carvedilol   6.25 mg Oral BID WC   Chlorhexidine  Gluconate Cloth  6 each Topical Q0600   DULoxetine   60 mg Oral Daily   insulin  aspart  0-6 Units Subcutaneous TID WC   levETIRAcetam  500 mg Oral Daily   levETIRAcetam  500 mg  Intravenous Once per day on Monday Wednesday Friday   levothyroxine   175 mcg Oral QAC breakfast   memantine   10 mg Oral BID    Dialysis Orders: -outpatient HD orders: [facility], home clinic: [facility]). BFR 400, DFR 16.4L/hr. EDW 86.5. 2k, 45 lactate. AVF 15g (buttonholes). No heparin . Meds: venofer 100mg , last mircera 225mcg on 9/24   Assessment/Plan: 1. Encephalopathy: partial complex seizure, per neuro.  2. ESRD: typically does HD on Tues, Wed, Sat, Sun. Will  plan for HD tomorrow and most likely keep her on MWF schedule while she's here. K+ 3.0, use 4K bath 3. HTN/volume:  BP elevated, continue home meds and follow BP port HD today 4. Anemia:  Hgb 8.7, hemorrhagic cystitis likely contributing. Tsat 39%. Ordered ESA 5. Secondary hyperparathyroidism:  Calcium  and phos controlled. No binder 6. Nutrition:   Has NG tube 7. Hemorrhagic cystitis: Urology consulted, bladder irrigation in progress  Lucie Collet, PA-C 04/08/2024, 10:14 AM  [facility] Pager: [phone]

## 2024-04-08 NOTE — Progress Notes (Signed)
LTM maint complete - no skin breakdown under: F3, F4, F8

## 2024-04-08 NOTE — Progress Notes (Signed)
 Patient off unit to dialysis. CBI off per Ole Bourdon, NP. I gave report to HD nurse, instructing her to watch the foley and if it turns bright red again she may have to manually irrigate or turn CBI back on.

## 2024-04-08 NOTE — Progress Notes (Signed)
 Pt receives home HD care through [facility] home therapy dept. Will assist as needed.   Randine Mungo Dialysis Navigator [phone]

## 2024-04-08 NOTE — TOC CM/SW Note (Signed)
 Transition of Care [facility]) - Inpatient Brief Assessment   Patient Details  Name: Jennifer [name] MRN: [id number] Date of Birth: 12-21-1955  [facility] of [facility]) CM/SW Contact:    Andrez JULIANNA George, RN Phone Number: 04/08/2024, 1:05 PM   Clinical Narrative:  Please place consult if IP Care management needs arise.   Transition of Care Asessment: Insurance and Status: Insurance coverage has been reviewed Patient has primary care physician: Yes Home environment has been reviewed: home with spouse   Prior/Current Home Services: No current home services Social Drivers of Health Review: SDOH reviewed no interventions necessary Readmission risk has been reviewed: Yes Transition of care needs: no transition of care needs at this time

## 2024-04-09 DIAGNOSIS — R569 Unspecified convulsions: Secondary | ICD-10-CM | POA: Diagnosis not present

## 2024-04-09 LAB — GLUCOSE, CAPILLARY
Glucose-Capillary: 103 mg/dL — ABNORMAL HIGH (ref 70–99)
Glucose-Capillary: 197 mg/dL — ABNORMAL HIGH (ref 70–99)
Glucose-Capillary: 173 mg/dL — ABNORMAL HIGH (ref 70–99)
Glucose-Capillary: 136 mg/dL — ABNORMAL HIGH (ref 70–99)

## 2024-04-09 LAB — CBC
HCT: 28.4 % — ABNORMAL LOW (ref 36.0–46.0)
Hemoglobin: 9 g/dL — ABNORMAL LOW (ref 12.0–15.0)
MCH: 28.6 pg (ref 26.0–34.0)
MCHC: 31.7 g/dL (ref 30.0–36.0)
MCV: 90.2 fL (ref 80.0–100.0)
Platelets: 249 K/uL (ref 150–400)
RBC: 3.15 MIL/uL — ABNORMAL LOW (ref 3.87–5.11)
RDW: 15.7 % — ABNORMAL HIGH (ref 11.5–15.5)
WBC: 8.9 K/uL (ref 4.0–10.5)
nRBC: 0 % (ref 0.0–0.2)

## 2024-04-09 LAB — COMPREHENSIVE METABOLIC PANEL WITH GFR
ALT: 8 U/L (ref 0–44)
AST: 14 U/L — ABNORMAL LOW (ref 15–41)
Albumin: 3 g/dL — ABNORMAL LOW (ref 3.5–5.0)
Alkaline Phosphatase: 72 U/L (ref 38–126)
Anion gap: 11 (ref 5–15)
BUN: 16 mg/dL (ref 8–23)
CO2: 26 mmol/L (ref 22–32)
Calcium: 8.1 mg/dL — ABNORMAL LOW (ref 8.9–10.3)
Chloride: 97 mmol/L — ABNORMAL LOW (ref 98–111)
Creatinine, Ser: 4.52 mg/dL — ABNORMAL HIGH (ref 0.44–1.00)
GFR, Estimated: 10 mL/min — ABNORMAL LOW (ref >60)
Glucose, Bld: 131 mg/dL — ABNORMAL HIGH (ref 70–99)
Potassium: 3.6 mmol/L (ref 3.5–5.1)
Sodium: 134 mmol/L — ABNORMAL LOW (ref 135–145)
Total Bilirubin: 0.4 mg/dL (ref 0.0–1.2)
Total Protein: 5.6 g/dL — ABNORMAL LOW (ref 6.5–8.1)

## 2024-04-09 LAB — MAGNESIUM: Magnesium: 1.8 mg/dL (ref 1.7–2.4)

## 2024-04-09 LAB — HEPATITIS B SURFACE ANTIBODY, QUANTITATIVE: Hep B S AB Quant (Post): <3.5 m[IU]/mL — ABNORMAL LOW

## 2024-04-09 MED ORDER — ENSURE PLUS HIGH PROTEIN PO LIQD
237.0000 mL | Freq: Two times a day (BID) | ORAL | Status: DC
Start: 2024-04-10 — End: 2024-04-13
  Administered 2024-04-10 – 2024-04-13 (×7): 237 mL via ORAL

## 2024-04-09 MED ORDER — DARBEPOETIN ALFA 150 MCG/0.3ML IJ SOSY
150.0000 ug | PREFILLED_SYRINGE | INTRAMUSCULAR | Status: DC
  Administered 2024-04-09: 150 ug via SUBCUTANEOUS
  Filled 2024-04-09 (×2): qty 0.3

## 2024-04-09 MED ORDER — TRANEXAMIC ACID-NACL 1000-0.7 MG/100ML-% IV SOLN
1000.0000 mg | Freq: Once | INTRAVENOUS | Status: AC
  Administered 2024-04-09: 1000 mg via INTRAVENOUS
  Filled 2024-04-09 (×2): qty 100

## 2024-04-09 MED ORDER — CHLORHEXIDINE GLUCONATE CLOTH 2 % EX PADS: 6.0000 | MEDICATED_PAD | Freq: Every day | CUTANEOUS | Status: DC

## 2024-04-09 NOTE — Plan of Care (Addendum)
 Patient alert to self and situation. Patient able to hold conversations, foam on right lower leg lifted revealing black tissue. CBI not running per day shift nurse report. Foley maintained. Patient turned and microshifted to maintain turn tolerance. Patient left with call bell in reach and bed in lowest position.  Problem: Education: Goal: Knowledge of General Education information will improve Description: Including pain rating scale, medication(s)/side effects and non-pharmacologic comfort measures Outcome: Progressing   Problem: Health Behavior/Discharge Planning: Goal: Ability to manage health-related needs will improve Outcome: Progressing   Problem: Clinical Measurements: Goal: Ability to maintain clinical measurements within normal limits will improve Outcome: Progressing   Problem: Activity: Goal: Risk for activity intolerance will decrease Outcome: Progressing   Problem: Nutrition: Goal: Adequate nutrition will be maintained Outcome: Progressing   Problem: Coping: Goal: Level of anxiety will decrease Outcome: Progressing   Problem: Elimination: Goal: Will not experience complications related to bowel motility Outcome: Progressing   Problem: Pain Managment: Goal: General experience of comfort will improve and/or be controlled Outcome: Progressing   Problem: Safety: Goal: Ability to remain free from injury will improve Outcome: Progressing   Problem: Education: Goal: Ability to describe self-care measures that may prevent or decrease complications (Diabetes Survival Skills Education) will improve Outcome: Progressing   Problem: Coping: Goal: Ability to adjust to condition or change in health will improve Outcome: Progressing

## 2024-04-09 NOTE — Progress Notes (Signed)
 [facility] Progress Note   Subjective:   Alert this AM with family at bedside. She denies SOB, CP, dizziness, nausea.   Objective Vitals:   04/08/24 2357 04/09/24 0436 04/09/24 0703 04/09/24 0822  BP: (!) 114/57 (!) 119/57  (!) 136/55  Pulse: 81 76  84  Resp: 18 16    Temp: 99 F (37.2 C) 99 F (37.2 C) 97.8 F (36.6 C) 98.2 F (36.8 C)  TempSrc: Oral Oral Oral Oral  SpO2: 100% 100%    Weight:      Height:       Physical Exam General: Alert female in NAD, in safety mittens Heart: RRR, no murmurs, rubs or gallops Lungs: CTA bilaterally, respirations unlabored Abdomen: Soft, non-distended, +BS Extremities: No edema b/l lower extremities Dialysis Access: AVF +t/b  Additional Objective Labs: Basic Metabolic Panel: Recent Labs  Lab 04/06/24 1251 04/07/24 0327 04/09/24 0228  NA 138 137 134*  K 3.8 3.0* 3.6  CL 94* 96* 97*  CO2 25 28 26   GLUCOSE 157* 74 131*  BUN 23 24* 16  CREATININE 4.53* 5.42* 4.52*  CALCIUM  9.1 8.5* 8.1*  PHOS  --  2.6  --    Liver Function Tests: Recent Labs  Lab 04/06/24 1251 04/07/24 0327 04/09/24 0228  AST 18 13* 14*  ALT 6 8 8   ALKPHOS 100 70 72  BILITOT 0.4 0.8 0.4  PROT 6.3* 5.1* 5.6*  ALBUMIN  3.8 2.9* 3.0*   Recent Labs  Lab 04/03/24 1753  LIPASE 12   CBC: Recent Labs  Lab 04/03/24 1753 04/06/24 1251 04/07/24 0327 04/09/24 0228  WBC 6.6 6.5 4.2 8.9  NEUTROABS  --  5.2  --   --   HGB 9.6* 9.8* 8.7* 9.0*  HCT 30.9* 31.0* 27.4* 28.4*  MCV 90.4 92.0 89.8 90.2  PLT 233 249 229 249   Blood Culture    Component Value Date/Time   SDES URINE, CLEAN CATCH 04/06/2024 1830   SPECREQUEST NONE 04/06/2024 1830   CULT (A) 04/06/2024 1830    >=100,000 COLONIES/mL KLEBSIELLA PNEUMONIAE 50,000 COLONIES/mL ESCHERICHIA COLI REPEATING SENSITIVITY Performed at [facility], [address], [place], [place] [address]    REPTSTATUS PENDING 04/06/2024 1830    Cardiac Enzymes: No results for input(s):  CKTOTAL, CKMB, CKMBINDEX, TROPONINI in the last 168 hours. CBG: Recent Labs  Lab 04/08/24 1206 04/08/24 1903 04/08/24 2131 04/09/24 0649 04/09/24 1117  GLUCAP 150* 107* 206* 103* 197*   Iron Studies:  Recent Labs    04/08/24 0231  IRON 78  TIBC 200*  FERRITIN 799*   @lablastinr3 @ Studies/Results: Overnight EEG with video Result Date: 04/08/2024 Shelton Arlin KIDD, MD     04/09/2024 10:41 AM Patient Name: Jennifer [name] MRN: [id number] Epilepsy Attending: Arlin KIDD Shelton Referring Physician/Provider: Judithe Rocky BROCKS, NP Duration: 04/07/2024 1138 to 04/08/2024 1338 Patient history: 68yo F with right gaze deviation and decreased responsiveness. EEG to evaluate for seizure Level of alertness: Awake, asleep AEDs during EEG study: LEV Technical aspects: This EEG study was done with scalp electrodes positioned according to the 10-20 International system of electrode placement. Electrical activity was reviewed with band pass filter of 1-70Hz , sensitivity of 7 uV/mm, display speed of 76mm/sec with a 60Hz  notched filter applied as appropriate. EEG data were recorded continuously and digitally stored.  Video monitoring was available and reviewed as appropriate. Description: EEG showed continuous generalized 3 to 6 Hz theta-delta slowing. Hyperventilation and photic stimulation were not performed.   ABNORMALITY - Continuous  slow, generalized IMPRESSION: This study is suggestive of generalized non specific cerebral dysfunction (encephalopathy). No seizures or epileptiform discharges were seen throughout the recording. Priyanka O Yadav   Medications:  cefTRIAXone  (ROCEPHIN )  IV 1 g (04/09/24 0951)   sodium chloride  irrigation      amLODipine   5 mg Oral Daily   atorvastatin   80 mg Oral QPM   carvedilol   6.25 mg Oral BID WC   Chlorhexidine  Gluconate Cloth  6 each Topical Q0600   DULoxetine   60 mg Oral Daily   insulin  aspart  0-6 Units Subcutaneous TID WC   levETIRAcetam  500 mg Oral Daily    levETIRAcetam  500 mg Intravenous Once per day on Monday Wednesday Friday   levothyroxine   175 mcg Oral QAC breakfast   memantine   10 mg Oral BID    Dialysis Orders: outpatient HD orders: [facility], home clinic: [facility]). BFR 400, DFR 16.4L/hr. EDW 86.5. 2k, 45 lactate. AVF 15g (buttonholes). No heparin . Meds: venofer 100mg , last mircera 225mcg on 9/24   Assessment/Plan: 1. Encephalopathy: partial complex seizure, per neuro.  2. ESRD: typically does HD on Tues, Wed, Sat, Sun. Keeping her on a MWF schedule here 3. HTN/volume:  BP elevated but imrpoved post HD. Continue home meds 4. Anemia:  Hgb 9.0, hemorrhagic cystitis likely contributing. Tsat 39%. Ordered ESA 5. Secondary hyperparathyroidism:  Calcium  and phos controlled. No binder 6. Nutrition:   Has NG tube 7. Hemorrhagic cystitis: Urology consulted, bladder irrigation in progress 8. Hypokalemia: K+ low yesterday, improved with 4K bath with HD. Continue added K bath per protocol   Lucie Collet, PA-C 04/09/2024, 12:01 PM  [facility] Pager: [phone]

## 2024-04-09 NOTE — Progress Notes (Signed)
 Subjective: No acute events overnight.  Had HD yesterday.  Husband at bedside today states she has good and bad days due to her baseline dementia.  Patient keeps requesting to remove mittens and refuses to answer other questions.  ROS: negative except above  Examination  Vital signs in last 24 hours: Temp:  [97.8 F (36.6 C)-99.1 F (37.3 C)] 98.2 F (36.8 C) (10/28 9177) Pulse Rate:  [70-91] 84 (10/28 0822) Resp:  [10-18] 16 (10/28 0436) BP: (92-184)/(55-70) 136/55 (10/28 0822) SpO2:  [11 %-100 %] 100 % (10/28 0436) Weight:  [81.7 kg-82.9 kg] 81.7 kg (10/27 1840)  General: lying in bed, NAD Neuro: Awake, alert, looks at examiner, refuses to answer orientation questions or follow commands but did recognize her husband, spontaneously moving all 4 extremities  Basic Metabolic Panel: Recent Labs  Lab 04/03/24 1753 04/06/24 1251 04/07/24 0327 04/09/24 0228  NA 137 138 137 134*  K 3.2* 3.8 3.0* 3.6  CL 95* 94* 96* 97*  CO2 30 25 28 26   GLUCOSE 98 157* 74 131*  BUN 18 23 24* 16  CREATININE 4.42* 4.53* 5.42* 4.52*  CALCIUM  9.1 9.1 8.5* 8.1*  MG  --   --  1.8 1.8  PHOS  --   --  2.6  --     CBC: Recent Labs  Lab 04/03/24 1753 04/06/24 1251 04/07/24 0327 04/09/24 0228  WBC 6.6 6.5 4.2 8.9  NEUTROABS  --  5.2  --   --   HGB 9.6* 9.8* 8.7* 9.0*  HCT 30.9* 31.0* 27.4* 28.4*  MCV 90.4 92.0 89.8 90.2  PLT 233 249 229 249     Coagulation Studies: Recent Labs    04/06/24 1251  LABPROT 15.8*  INR 1.2    Imaging No new brain imaging overnight   ASSESSMENT AND PLAN:68 year old female with history of dementia, 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  Since then patient has been started on Keppra.  However continues to be encephalopathic.  No clear seizures on EEG.  MRI brain did not show any acute abnormality     Acute encephalopathy,  resolved -Likely due to prolonged postictal state in a patient with history of dementia (poor neurocognitive reserve) and multiple other comorbidities. - Continue Keppra 500 mg daily with additional 500 mg on dialysis days after dialysis -Follow-up with neurology in 4 to 8 weeks (order placed) - Rest of the management per primary team -Discussed plan with hospitalist Dr. Trixie  Seizure precautions: Per [place]  DMV statutes, patients with seizures are not allowed to drive until they have been seizure-free for six months and cleared by a physician    Use caution when using heavy equipment or power tools. Avoid working on ladders or at heights. Take showers instead of baths. Ensure the water temperature is not too high on the home water heater. Do not go swimming alone. Do not lock yourself in a room alone (i.e. bathroom). When caring for infants or small children, sit down when holding, feeding, or changing them to minimize risk of injury to the child in the event you have a seizure. Maintain good sleep hygiene. Avoid alcohol .    If patient has another seizure, call 911 and bring them back to the ED if: A.  The seizure lasts longer than 5 minutes.      B.  The patient doesn't wake shortly after the seizure or has new problems such  as difficulty seeing, speaking or moving following the seizure C.  The patient was injured during the seizure D.  The patient has a temperature over 102 F (39C) E.  The patient vomited during the seizure and now is having trouble breathing    During the Seizure   - First, ensure adequate ventilation and place patients on the floor on their left side  Loosen clothing around the neck and ensure the airway is patent. If the patient is clenching the teeth, do not force the mouth open with any object as this can cause severe damage - Remove all items from the surrounding that can be hazardous. The patient may be oblivious to what's happening and may not even know  what he or she is doing. If the patient is confused and wandering, either gently guide him/her away and block access to outside areas - Reassure the individual and be comforting - Call 911. In most cases, the seizure ends before EMS arrives. However, there are cases when seizures may last over 3 to 5 minutes. Or the individual may have developed breathing difficulties or severe injuries. If a pregnant patient or a person with diabetes develops a seizure, it is prudent to call an ambulance.     After the Seizure (Postictal Stage)   After a seizure, most patients experience confusion, fatigue, muscle pain and/or a headache. Thus, one should permit the individual to sleep. For the next few days, reassurance is essential. Being calm and helping reorient the person is also of importance.   Most seizures are painless and end spontaneously. Seizures are not harmful to others but can lead to complications such as stress on the lungs, brain and the heart. Individuals with prior lung problems may develop labored breathing and respiratory distress.           I personally spent a total of 36 minutes in the care of the patient today including getting/reviewing separately obtained history, performing a medically appropriate exam/evaluation, counseling and educating, placing orders, referring and communicating with other health care professionals, documenting clinical information in the EHR, independently interpreting results, and coordinating care.        Arlin Krebs Epilepsy [facility] For questions after 5pm please refer to AMION to reach the Neurologist on call

## 2024-04-09 NOTE — Progress Notes (Signed)
 PROGRESS NOTE  Jennifer [name] FMW:[id number] DOB: Oct 14, 1955 DOA: 04/06/2024 PCP: Randeen Laine LABOR, MD   LOS: 2 days   Brief Narrative / Interim history: 68 y.o. 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  She actually has no complaints.  She is confused  Assesement and Plan: Principal problem Mental status changes on admission, acute metabolic encephalopathy -etiology not entirely clear, CVA versus seizures.  Given response to Ativan , latter seems to be more plausible - Neurology consulted, appreciate input.  She was started on Keppra, continue - Continue seizure precautions, LTM EEG did not show any seizures - MRI of the brain negative for acute findings   Active problems Hemorrhagic cystitis - patient was diagnosed with a UTI, has completed Bactrim and currently is on Keflex  for persistent hematuria.  She has also been passing a significant amount of blood clots.  Urology consulted, getting CBI right now, hematuria clearing. - Urine cultures with Klebsiella resistant to ampicillin, cefazolin , nitrofurantoin, Bactrim, Unasyn and Zosyn.  Continue ceftriaxone  for now   ESRD -nephrology consulted, appreciate input.  HD yesterday   History of PE-Eliquis  is on hold due to persistent hematuria, will resume once okay with urology   Vascular dementia -she has good days and bad days per patient's daughters.  Resume home meds   Essential hypertension -continue home medications, pressure stabilized   Hypothyroidism-resume Synthroid    Depression-continue home medications  except Wellbutrin  which will be held per neurology   Hyperlipidemia-resume statin   Right shin wound-monitor, does not appear infected  Scheduled Meds:  amLODipine   5 mg Oral Daily   atorvastatin   80 mg Oral QPM   carvedilol   6.25 mg Oral BID WC   Chlorhexidine  Gluconate Cloth  6 each Topical Q0600   DULoxetine   60 mg Oral Daily   insulin  aspart  0-6 Units Subcutaneous TID WC   levETIRAcetam  500 mg Oral Daily   levETIRAcetam  500 mg Intravenous Once per day on Monday Wednesday Friday   levothyroxine   175 mcg Oral QAC breakfast   memantine   10 mg Oral BID   Continuous Infusions:  cefTRIAXone  (ROCEPHIN )  IV 1 g (04/09/24 0951)   sodium chloride  irrigation     PRN Meds:.acetaminophen  **OR** acetaminophen , HYDROcodone -acetaminophen , HYDROmorphone  (DILAUDID ) injection, ondansetron  **OR** ondansetron  (ZOFRAN ) IV  Current Outpatient Medications  Medication Instructions   acetaminophen  (TYLENOL ) 1,000 mg, Every 6 hours PRN   amLODipine  (NORVASC ) 5 MG tablet 1 tablet, Daily   apixaban  (ELIQUIS ) 5 mg, Oral, 2 times daily   aspirin  81 mg, Oral, Daily   atorvastatin  (LIPITOR ) 80 MG tablet TAKE 1 TABLET BY MOUTH ONCE DAILY AT  6  IN  THE  EVENING   buPROPion  (WELLBUTRIN  SR) 150 mg, Oral, 2 times daily   carvedilol  (COREG ) 6.25 mg, 2 times daily with meals   cephALEXin  (KEFLEX ) 500 mg, Oral, 4 times daily   DULoxetine  (CYMBALTA ) 60 mg, Oral, Daily   iron sucrose 100 mg in sodium chloride  0.9 % 100 mL 100 mg, Every Dialysis   levothyroxine  (SYNTHROID ) 175 mcg, Daily before breakfast   lidocaine -prilocaine  (EMLA ) cream 1 Application, Daily PRN  memantine  (NAMENDA ) 10 mg, Oral, 2 times daily   Mircera 200 mcg, Daily PRN   nitroGLYCERIN  (NITROSTAT ) 0.4 mg, Every 5 min x3 PRN   OVER THE COUNTER MEDICATION 1 application , See admin instructions   Potassium Chloride  ER 20 MEQ TBCR 1 tablet, Daily   sevelamer  carbonate (RENVELA ) 0.8 g, 3 times daily with meals   sulfamethoxazole-trimethoprim  (BACTRIM DS) 800-160 MG tablet 1 tablet, Oral, Daily    Diet Orders (From admission, onward)     Start     Ordered   04/07/24 1620  DIET - DYS 1 Room service appropriate? Yes; Fluid consistency: Thin  Diet effective now       Comments: Thin liquids  Question Answer Comment  Room service appropriate? Yes   Fluid consistency: Thin      04/07/24 1621            DVT prophylaxis: SCDs Start: 04/06/24 2021   Lab Results  Component Value Date   PLT 249 04/09/2024      Code Status: Limited: Do not attempt resuscitation (DNR) -DNR-LIMITED -Do Not Intubate/DNI   Family Communication: No family at bedside  Status is: Observation The patient will require care spanning > 2 midnights and should be moved to inpatient because: Persistent hematuria, encephalopathy   Level of care: Telemetry Medical  Consultants:  Neurology Urology Nephrology  Objective: Vitals:   04/08/24 2357 04/09/24 0436 04/09/24 0703 04/09/24 0822  BP: (!) 114/57 (!) 119/57  (!) 136/55  Pulse: 81 76  84  Resp: 18 16    Temp: 99 F (37.2 C) 99 F (37.2 C) 97.8 F (36.6 C) 98.2 F (36.8 C)  TempSrc: Oral Oral Oral Oral  SpO2: 100% 100%    Weight:      Height:        Intake/Output Summary (Last 24 hours) at 04/09/2024 1026 Last data filed at 04/09/2024 0957 Gross per 24 hour  Intake 406 ml  Output 5400 ml  Net -4994 ml   Wt Readings from Last 3 Encounters:  04/08/24 81.7 kg  04/03/24 85.3 kg  03/12/24 90.7 kg    Examination:  Constitutional: NAD Eyes: lids and conjunctivae normal, no scleral icterus ENMT: mmm Neck: normal, supple Respiratory: clear to auscultation bilaterally, no wheezing, no crackles.  Cardiovascular: Regular rate and rhythm, no murmurs / rubs / gallops. No LE edema. Abdomen: soft, no distention, no tenderness. Bowel sounds positive.   Data Reviewed: I have independently reviewed following labs and imaging studies   CBC Recent Labs  Lab 04/03/24 1753  04/06/24 1251 04/07/24 0327 04/09/24 0228  WBC 6.6 6.5 4.2 8.9  HGB 9.6* 9.8* 8.7* 9.0*  HCT 30.9* 31.0* 27.4* 28.4*  PLT 233 249 229 249  MCV 90.4 92.0 89.8 90.2  MCH 28.1 29.1 28.5 28.6  MCHC 31.1 31.6 31.8 31.7  RDW 14.5 14.8 14.8 15.7*  LYMPHSABS  --  0.8  --   --   MONOABS  --  0.3  --   --   EOSABS  --  0.1  --   --   BASOSABS  --  0.0  --   --     Recent Labs  Lab 04/03/24 1753 04/06/24 1251 04/07/24 0327 04/09/24 0228  NA 137 138 137 134*  K 3.2* 3.8 3.0* 3.6  CL 95* 94* 96* 97*  CO2 30 25 28 26   GLUCOSE 98 157* 74 131*  BUN 18 23 24* 16  CREATININE 4.42* 4.53* 5.42* 4.52*  CALCIUM  9.1  9.1 8.5* 8.1*  AST 17 18 13* 14*  ALT 6 6 8 8   ALKPHOS 83 100 70 72  BILITOT 0.4 0.4 0.8 0.4  ALBUMIN  3.7 3.8 2.9* 3.0*  MG  --   --  1.8 1.8  INR  --  1.2  --   --     ------------------------------------------------------------------------------------------------------------------ No results for input(s): CHOL, HDL, LDLCALC, TRIG, CHOLHDL, LDLDIRECT in the last 72 hours.  Lab Results  Component Value Date   HGBA1C 5.7 (H) 11/29/2023   ------------------------------------------------------------------------------------------------------------------ No results for input(s): TSH, T4TOTAL, T3FREE, THYROIDAB in the last 72 hours.  Invalid input(s): FREET3  Cardiac Enzymes No results for input(s): CKMB, TROPONINI, MYOGLOBIN in the last 168 hours.  Invalid input(s): CK ------------------------------------------------------------------------------------------------------------------    Component Value Date/Time   BNP 59.6 12/07/2023 1700    CBG: Recent Labs  Lab 04/08/24 0650 04/08/24 1206 04/08/24 1903 04/08/24 2131 04/09/24 0649  GLUCAP 125* 150* 107* 206* 103*    Recent Results (from the past 240 hours)  Resp panel by RT-PCR (RSV, Flu A&B, Covid) Anterior Nasal Swab     Status: None   Collection Time: 04/03/24  5:24 PM    Specimen: Anterior Nasal Swab  Result Value Ref Range Status   SARS Coronavirus 2 by RT PCR NEGATIVE NEGATIVE Final    Comment: (NOTE) SARS-CoV-2 target nucleic acids are NOT DETECTED.  The SARS-CoV-2 RNA is generally detectable in upper respiratory specimens during the acute phase of infection. The lowest concentration of SARS-CoV-2 viral copies this assay can detect is 138 copies/mL. A negative result does not preclude SARS-Cov-2 infection and should not be used as the sole basis for treatment or other patient management decisions. A negative result may occur with  improper specimen collection/handling, submission of specimen other than nasopharyngeal swab, presence of viral mutation(s) within the areas targeted by this assay, and inadequate number of viral copies(<138 copies/mL). A negative result must be combined with clinical observations, patient history, and epidemiological information. The expected result is Negative.  Fact Sheet for Patients:  bloggercourse.com  Fact Sheet for Healthcare Providers:  seriousbroker.it  This test is no t yet approved or cleared by the [place]  FDA and  has been authorized for detection and/or diagnosis of SARS-CoV-2 by FDA under an Emergency Use Authorization (EUA). This EUA will remain  in effect (meaning this test can be used) for the duration of the COVID-19 declaration under Section 564(b)(1) of the Act, 21 U.S.C.section 360bbb-3(b)(1), unless the authorization is terminated  or revoked sooner.       Influenza A by PCR NEGATIVE NEGATIVE Final   Influenza B by PCR NEGATIVE NEGATIVE Final    Comment: (NOTE) The Xpert Xpress SARS-CoV-2/FLU/RSV plus assay is intended as an aid in the diagnosis of influenza from Nasopharyngeal swab specimens and should not be used as a sole basis for treatment. Nasal washings and aspirates are unacceptable for Xpert Xpress  SARS-CoV-2/FLU/RSV testing.  Fact Sheet for Patients: bloggercourse.com  Fact Sheet for Healthcare Providers: seriousbroker.it  This test is not yet approved or cleared by the [place]  FDA and has been authorized for detection and/or diagnosis of SARS-CoV-2 by FDA under an Emergency Use Authorization (EUA). This EUA will remain in effect (meaning this test can be used) for the duration of the COVID-19 declaration under Section 564(b)(1) of the Act, 21 U.S.C. section 360bbb-3(b)(1), unless the authorization is terminated or revoked.     Resp Syncytial Virus by PCR NEGATIVE NEGATIVE Final    Comment: (NOTE)  Fact Sheet for Patients: bloggercourse.com  Fact Sheet for Healthcare Providers: seriousbroker.it  This test is not yet approved or cleared by the [place]  FDA and has been authorized for detection and/or diagnosis of SARS-CoV-2 by FDA under an Emergency Use Authorization (EUA). This EUA will remain in effect (meaning this test can be used) for the duration of the COVID-19 declaration under Section 564(b)(1) of the Act, 21 U.S.C. section 360bbb-3(b)(1), unless the authorization is terminated or revoked.  Performed at [facility], [address] [place], [place] [address]   Urine Culture (for pregnant, neutropenic or urologic patients or patients with an indwelling urinary catheter)     Status: Abnormal (Preliminary result)   Collection Time: 04/06/24  6:30 PM   Specimen: Urine, Clean Catch  Result Value Ref Range Status   Specimen Description URINE, CLEAN CATCH  Final   Special Requests NONE  Final   Culture (A)  Final    >=100,000 COLONIES/mL KLEBSIELLA PNEUMONIAE 50,000 COLONIES/mL ESCHERICHIA COLI REPEATING SENSITIVITY Performed at [facility], [address], [place], [place] [address]    Report Status PENDING  Incomplete   Organism ID, Bacteria  KLEBSIELLA PNEUMONIAE (A)  Final      Susceptibility   Klebsiella pneumoniae - MIC*    AMPICILLIN >=32 RESISTANT Resistant     CEFAZOLIN  (URINE) Value in next row Resistant      >=32 RESISTANTThis is a modified FDA-approved test that has been validated and its performance characteristics determined by the reporting laboratory.  This laboratory is certified under the Clinical Laboratory Improvement Amendments CLIA as qualified to perform high complexity clinical laboratory testing.    CEFEPIME  Value in next row Sensitive      >=32 RESISTANTThis is a modified FDA-approved test that has been validated and its performance characteristics determined by the reporting laboratory.  This laboratory is certified under the Clinical Laboratory Improvement Amendments CLIA as qualified to perform high complexity clinical laboratory testing.    ERTAPENEM Value in next row Sensitive      >=32 RESISTANTThis is a modified FDA-approved test that has been validated and its performance characteristics determined by the reporting laboratory.  This laboratory is certified under the Clinical Laboratory Improvement Amendments CLIA as qualified to perform high complexity clinical laboratory testing.    CEFTRIAXONE  Value in next row Sensitive      >=32 RESISTANTThis is a modified FDA-approved test that has been validated and its performance characteristics determined by the reporting laboratory.  This laboratory is certified under the Clinical Laboratory Improvement Amendments CLIA as qualified to perform high complexity clinical laboratory testing.    CIPROFLOXACIN Value in next row Sensitive      >=32 RESISTANTThis is a modified FDA-approved test that has been validated and its performance characteristics determined by the reporting laboratory.  This laboratory is certified under the Clinical Laboratory Improvement Amendments CLIA as qualified to perform high complexity clinical laboratory testing.    GENTAMICIN Value in next  row Sensitive      >=32 RESISTANTThis is a modified FDA-approved test that has been validated and its performance characteristics determined by the reporting laboratory.  This laboratory is certified under the Clinical Laboratory Improvement Amendments CLIA as qualified to perform high complexity clinical laboratory testing.    NITROFURANTOIN Value in next row Intermediate      >=32 RESISTANTThis is a modified FDA-approved test that has been validated and its performance characteristics determined by the reporting laboratory.  This laboratory is certified under the Clinical Laboratory Improvement  Amendments CLIA as qualified to perform high complexity clinical laboratory testing.    TRIMETH/SULFA Value in next row Resistant      >=32 RESISTANTThis is a modified FDA-approved test that has been validated and its performance characteristics determined by the reporting laboratory.  This laboratory is certified under the Clinical Laboratory Improvement Amendments CLIA as qualified to perform high complexity clinical laboratory testing.    AMPICILLIN/SULBACTAM Value in next row Resistant      >=32 RESISTANTThis is a modified FDA-approved test that has been validated and its performance characteristics determined by the reporting laboratory.  This laboratory is certified under the Clinical Laboratory Improvement Amendments CLIA as qualified to perform high complexity clinical laboratory testing.    PIP/TAZO Value in next row Resistant      >=128 RESISTANTThis is a modified FDA-approved test that has been validated and its performance characteristics determined by the reporting laboratory.  This laboratory is certified under the Clinical Laboratory Improvement Amendments CLIA as qualified to perform high complexity clinical laboratory testing.    MEROPENEM Value in next row Sensitive      >=128 RESISTANTThis is a modified FDA-approved test that has been validated and its performance characteristics determined by  the reporting laboratory.  This laboratory is certified under the Clinical Laboratory Improvement Amendments CLIA as qualified to perform high complexity clinical laboratory testing.    * >=100,000 COLONIES/mL KLEBSIELLA PNEUMONIAE     Radiology Studies: No results found.    Nilda Fendt, MD, PhD [facility]  Between 7 am - 7 pm I am available, please contact me via Amion (for emergencies) or Securechat (non urgent messages)  Between 7 pm - 7 am I am not available, please contact night coverage MD/APP via Amion

## 2024-04-09 NOTE — Progress Notes (Signed)
 Speech Language Pathology Treatment: Dysphagia  Patient Details Name: Jennifer [name] MRN: [id number] DOB: [birth year] Today's Date: 04/09/2024 Time: 8657-8647 SLP Time Calculation (min) (ACUTE ONLY): 10 min  Assessment / Plan / Recommendation Clinical Impression  SLP observed patient eating lunch. Patient's daughter was at bedside feeding her. Patient was eating her pureed lunch with thin liquids. Daughter shared with SLP that patient was eating normally before this with no difficulties and asked if patient's diet could be advanced. SLP brought patient diced peaches and graham crackers for PO trials. Patient's swallow initiation was timely and no overt s/s of aspiration were observed with straw sips of thin liquids, and bites of mechanical soft solids. SLP is recommending advance to Dys 2 with thin liquids. SLP will continue to follow for diet toleration.   HPI HPI: 68 yo female presenting 10/25 with R gaze deviation and decreased responsiveness. CTH negative for acute changes. Work up still ongoing although differential includes stroke vs seizures with latter favored per MD given positive response to ativan . PMH includes: dementia, HTN, HLD, CAD, DM, CVA, CHF, ESRD. Pt has been seen by SLP multiple times in the past, typically with an oral more than pharyngeal dysphagia (no aspiration on MBS in 2024) requiring softer solids but able to consume thin liquids.      SLP Plan  Continue with current plan of care          Recommendations  Diet recommendations: Dysphagia 2 (fine chop);Thin liquid Liquids provided via: Teaspoon;Cup;Straw Medication Administration: Crushed with puree Supervision: Staff to assist with self feeding;Full supervision/cueing for compensatory strategies Compensations: Minimize environmental distractions;Slow rate;Small sips/bites Postural Changes and/or Swallow Maneuvers: Seated upright 90 degrees;Upright 30-60 min after meal                  Oral care BID    Other (comment) (tbd) Dysphagia, unspecified (R13.10)     Continue with current plan of care     Damien Hy  Graduate SLP [employer]

## 2024-04-09 NOTE — Progress Notes (Addendum)
     Subjective: NAEON. CBI clamped over night. Small amounts of dark frank blood in tubing today.   Objective: Vital signs in last 24 hours: Temp:  [97.8 F (36.6 C)-99.1 F (37.3 C)] 98.2 F (36.8 C) (10/28 0822) Pulse Rate:  [70-91] 76 (10/28 1137) Resp:  [10-18] 16 (10/28 0436) BP: (92-184)/(55-70) 146/62 (10/28 1137) SpO2:  [11 %-100 %] 98 % (10/28 1137) Weight:  [81.7 kg-82.9 kg] 81.7 kg (10/27 1840)  Assessment/Plan: 68 year old female with ESRD, CAD, CVA, dementia, T2DM, and hematuria  # Gross hematuria  lobulated hyperdensity in the bladder around 10 cm on 10/22.  Interval improvement in clot burden on repeat CT.  No focal mass on CT Hematuria 10/25. CBI clamped overnight.  Patient makes very small amounts of urine therefore there was only concentrated frank blood in tubing.  Hand irrigated and cleared with about 50 mL of water.  Primary team would like to restart Eliquis  in the near future.  Will have nursing irrigate bladder with TXA today and reassess tomorrow.  Outpt cystoscopy  Intake/Output from previous day: 10/27 0701 - 10/28 0700 In: 410 [P.O.:360] Out: 9800 [Urine:7800]  Intake/Output this shift: Total I/O In: 356 [P.O.:356] Out: -   Physical Exam:  General: Sleeping CV: No cyanosis Lungs: equal chest rise Abdomen: Soft, NTND, no rebound or guarding Gu: 3f Three-way Foley catheter in place draining dark red urine  Lab Results: Recent Labs    04/07/24 0327 04/09/24 0228  HGB 8.7* 9.0*  HCT 27.4* 28.4*   BMET Recent Labs    04/07/24 0327 04/09/24 0228  NA 137 134*  K 3.0* 3.6  CL 96* 97*  CO2 28 26  GLUCOSE 74 131*  BUN 24* 16  CREATININE 5.42* 4.52*  CALCIUM  8.5* 8.1*  HGB 8.7* 9.0*  WBC 4.2 8.9     Studies/Results: Overnight EEG with video Result Date: 04/08/2024 Shelton Arlin KIDD, MD     04/09/2024 10:41 AM Patient Name: SANDREA BOER MRN: [id number] Epilepsy Attending: Arlin KIDD Shelton Referring Physician/Provider: Judithe Rocky BROCKS, NP Duration: 04/07/2024 1138 to 04/08/2024 1338 Patient history: 68yo F with right gaze deviation and decreased responsiveness. EEG to evaluate for seizure Level of alertness: Awake, asleep AEDs during EEG study: LEV Technical aspects: This EEG study was done with scalp electrodes positioned according to the 10-20 International system of electrode placement. Electrical activity was reviewed with band pass filter of 1-70Hz , sensitivity of 7 uV/mm, display speed of 47mm/sec with a 60Hz  notched filter applied as appropriate. EEG data were recorded continuously and digitally stored.  Video monitoring was available and reviewed as appropriate. Description: EEG showed continuous generalized 3 to 6 Hz theta-delta slowing. Hyperventilation and photic stimulation were not performed.   ABNORMALITY - Continuous slow, generalized IMPRESSION: This study is suggestive of generalized non specific cerebral dysfunction (encephalopathy). No seizures or epileptiform discharges were seen throughout the recording. Arlin KIDD Shelton      LOS: 2 days   Ole Bourdon, NP [facility] Pager: [phone]  04/09/2024, 1:04 PM

## 2024-04-09 NOTE — Progress Notes (Signed)
 Foley catheter manually irrigated and drained per urology orders. 100ml of TXA administered intra foley catheter and left to dwell for 30 minutes. Patient tolerated well.

## 2024-04-10 ENCOUNTER — Inpatient Hospital Stay (HOSPITAL_COMMUNITY)

## 2024-04-10 DIAGNOSIS — R569 Unspecified convulsions: Secondary | ICD-10-CM | POA: Diagnosis not present

## 2024-04-10 LAB — COMPREHENSIVE METABOLIC PANEL WITH GFR
ALT: 7 U/L (ref 0–44)
AST: 17 U/L (ref 15–41)
Albumin: 2.8 g/dL — ABNORMAL LOW (ref 3.5–5.0)
Alkaline Phosphatase: 64 U/L (ref 38–126)
Anion gap: 12 (ref 5–15)
BUN: 26 mg/dL — ABNORMAL HIGH (ref 8–23)
CO2: 26 mmol/L (ref 22–32)
Calcium: 8.4 mg/dL — ABNORMAL LOW (ref 8.9–10.3)
Chloride: 92 mmol/L — ABNORMAL LOW (ref 98–111)
Creatinine, Ser: 5.46 mg/dL — ABNORMAL HIGH (ref 0.44–1.00)
GFR, Estimated: 8 mL/min — ABNORMAL LOW (ref >60)
Glucose, Bld: 119 mg/dL — ABNORMAL HIGH (ref 70–99)
Potassium: 4 mmol/L (ref 3.5–5.1)
Sodium: 130 mmol/L — ABNORMAL LOW (ref 135–145)
Total Bilirubin: 0.4 mg/dL (ref 0.0–1.2)
Total Protein: 5.3 g/dL — ABNORMAL LOW (ref 6.5–8.1)

## 2024-04-10 LAB — URINE CULTURE: Culture: >=100000 — AB

## 2024-04-10 LAB — GLUCOSE, CAPILLARY
Glucose-Capillary: 103 mg/dL — ABNORMAL HIGH (ref 70–99)
Glucose-Capillary: 163 mg/dL — ABNORMAL HIGH (ref 70–99)
Glucose-Capillary: 125 mg/dL — ABNORMAL HIGH (ref 70–99)
Glucose-Capillary: 180 mg/dL — ABNORMAL HIGH (ref 70–99)

## 2024-04-10 LAB — CBC
HCT: 29.4 % — ABNORMAL LOW (ref 36.0–46.0)
Hemoglobin: 9.1 g/dL — ABNORMAL LOW (ref 12.0–15.0)
MCH: 28.5 pg (ref 26.0–34.0)
MCHC: 31 g/dL (ref 30.0–36.0)
MCV: 92.2 fL (ref 80.0–100.0)
Platelets: 223 K/uL (ref 150–400)
RBC: 3.19 MIL/uL — ABNORMAL LOW (ref 3.87–5.11)
RDW: 15.5 % (ref 11.5–15.5)
WBC: 6.5 K/uL (ref 4.0–10.5)
nRBC: 0 % (ref 0.0–0.2)

## 2024-04-10 LAB — MAGNESIUM: Magnesium: 1.8 mg/dL (ref 1.7–2.4)

## 2024-04-10 MED ORDER — LIDOCAINE-PRILOCAINE 2.5-2.5 % EX CREA: 1.0000 | TOPICAL_CREAM | CUTANEOUS | Status: DC | PRN

## 2024-04-10 MED ORDER — LEVETIRACETAM 500 MG PO TABS
500.0000 mg | ORAL_TABLET | ORAL | Status: DC
  Administered 2024-04-10 – 2024-04-12 (×2): 500 mg via ORAL
  Filled 2024-04-10 (×2): qty 1

## 2024-04-10 MED ORDER — LIDOCAINE HCL (PF) 1 % IJ SOLN: 5.0000 mL | INTRAMUSCULAR | Status: DC | PRN

## 2024-04-10 MED ORDER — PENTAFLUOROPROP-TETRAFLUOROETH EX AERO: 1.0000 | INHALATION_SPRAY | CUTANEOUS | Status: DC | PRN

## 2024-04-10 MED ORDER — HEPARIN SODIUM (PORCINE) 1000 UNIT/ML DIALYSIS: 1000.0000 [IU] | INTRAMUSCULAR | Status: DC | PRN

## 2024-04-10 MED ORDER — ANTICOAGULANT SODIUM CITRATE 4% (200MG/5ML) IV SOLN: 5.0000 mL | Status: DC | PRN

## 2024-04-10 NOTE — Plan of Care (Signed)
   Problem: Coping: Goal: Ability to adjust to condition or change in health will improve Outcome: Progressing

## 2024-04-10 NOTE — Progress Notes (Signed)
 [facility] Progress Note   Subjective:   Pt sitting in bed, denies SOB and abdominal pain this AM. No new concerns.   Objective Vitals:   04/09/24 1550 04/09/24 2014 04/10/24 0401 04/10/24 0747  BP: (!) 148/65  (!) 150/62   Pulse: 78 77 72 72  Resp:      Temp: 98 F (36.7 C) 98.4 F (36.9 C) 97.9 F (36.6 C) 97.6 F (36.4 C)  TempSrc: Oral Oral Oral Axillary  SpO2: 97% 96% 95%   Weight:      Height:       Physical Exam General: Alert female in NAD Heart: RRR, no murmurs, rubs or gallops Lungs: CTA bilaterally, respirations unlabored Abdomen: Soft, non-distended, +BS Extremities: No edema b/l lower extremities Dialysis Access:  AVF + t/b  Additional Objective Labs: Basic Metabolic Panel: Recent Labs  Lab 04/07/24 0327 04/09/24 0228 04/10/24 0248  NA 137 134* 130*  K 3.0* 3.6 4.0  CL 96* 97* 92*  CO2 28 26 26   GLUCOSE 74 131* 119*  BUN 24* 16 26*  CREATININE 5.42* 4.52* 5.46*  CALCIUM  8.5* 8.1* 8.4*  PHOS 2.6  --   --    Liver Function Tests: Recent Labs  Lab 04/07/24 0327 04/09/24 0228 04/10/24 0248  AST 13* 14* 17  ALT 8 8 7   ALKPHOS 70 72 64  BILITOT 0.8 0.4 0.4  PROT 5.1* 5.6* 5.3*  ALBUMIN  2.9* 3.0* 2.8*   Recent Labs  Lab 04/03/24 1753  LIPASE 12   CBC: Recent Labs  Lab 04/03/24 1753 04/06/24 1251 04/07/24 0327 04/09/24 0228 04/10/24 0248  WBC 6.6 6.5 4.2 8.9 6.5  NEUTROABS  --  5.2  --   --   --   HGB 9.6* 9.8* 8.7* 9.0* 9.1*  HCT 30.9* 31.0* 27.4* 28.4* 29.4*  MCV 90.4 92.0 89.8 90.2 92.2  PLT 233 249 229 249 223   Blood Culture    Component Value Date/Time   SDES URINE, CLEAN CATCH 04/06/2024 1830   SPECREQUEST  04/06/2024 1830    NONE Performed at [facility], [address], [place], [place] [address]    CULT (A) 04/06/2024 1830    >=100,000 COLONIES/mL KLEBSIELLA PNEUMONIAE 50,000 COLONIES/mL ESCHERICHIA COLI    REPTSTATUS 04/10/2024 FINAL 04/06/2024 1830    Cardiac Enzymes: No results  for input(s): CKTOTAL, CKMB, CKMBINDEX, TROPONINI in the last 168 hours. CBG: Recent Labs  Lab 04/09/24 0649 04/09/24 1117 04/09/24 1620 04/09/24 2129 04/10/24 0639  GLUCAP 103* 197* 173* 136* 103*   Iron Studies:  Recent Labs    04/08/24 0231  IRON 78  TIBC 200*  FERRITIN 799*   @lablastinr3 @ Studies/Results: No results found. Medications:  anticoagulant sodium citrate      cefTRIAXone  (ROCEPHIN )  IV 1 g (04/09/24 0951)   sodium chloride  irrigation      amLODipine   5 mg Oral Daily   atorvastatin   80 mg Oral QPM   carvedilol   6.25 mg Oral BID WC   Chlorhexidine  Gluconate Cloth  6 each Topical Q0600   darbepoetin (ARANESP ) injection - DIALYSIS  150 mcg Subcutaneous Q Tue-1800   DULoxetine   60 mg Oral Daily   feeding supplement  237 mL Oral BID BM   insulin  aspart  0-6 Units Subcutaneous TID WC   levETIRAcetam  500 mg Oral Daily   levETIRAcetam  500 mg Oral Q M,W,F-1800   levothyroxine   175 mcg Oral QAC breakfast   memantine   10 mg Oral BID  Dialysis Orders:  outpatient HD orders: HHD, home clinic: [facility]). BFR 400, DFR 16.4L/hr. EDW 86.5. 2k, 45 lactate. AVF 15g (buttonholes). No heparin . Meds: venofer 100mg , last mircera 225mcg on 9/24   Assessment/Plan: 1. Encephalopathy: partial complex seizure, per neuro.  2. ESRD: typically does HD on Tues, Wed, Sat, Sun. Keeping her on a MWF schedule here 3. HTN/volume:  BP elevated but imrpoved post HD. Continue home meds 4. Anemia:  Hgb 9.1, hemorrhagic cystitis likely contributing. Tsat 39%. Ordered ESA 5. Secondary hyperparathyroidism:  Calcium  and phos controlled. No binder 7. Hemorrhagic cystitis: Urology consulted, bladder irrigation in progress 8. Hypokalemia: K+ low initially, improved with 4K bath with HD. Continue added K bath per protocol   Lucie Collet, PA-C 04/10/2024, 11:14 AM  [facility] Pager: [phone]

## 2024-04-10 NOTE — TOC Initial Note (Signed)
 Transition of Care [facility]) - Initial/Assessment Note    Patient Details  Name: Jennifer [name] MRN: [id number] Date of Birth: 01-Aug-1955  [facility]) CM/SW Contact:    Andrez JULIANNA George, RN Phone Number: 04/10/2024, 1:52 PM  Clinical Narrative:                 Jennifer [name] is a 68 y.o. 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  Pt is from home with spouse. Spouse says his daughters assist at home. Daughter Clotilda is a engineer, civil (consulting) and in charge of pts care at home. Family manages her medications and provides needed transportation.  No follow up per therapies.   IP Care management following.  Expected Discharge Plan: Home/Self Care Barriers to Discharge: Continued Medical Work up   Patient Goals and CMS Choice   CMS Medicare.gov Compare Post Acute Care list provided to:: Patient Represenative (must comment) Choice offered to / list presented to : Adult Children, Spouse      Expected Discharge Plan and Services       Living arrangements for the past 2 months: Single Family Home                                      Prior Living Arrangements/Services Living arrangements for the past 2 months: Single Family Home Lives with:: Spouse Patient language and need for interpreter reviewed:: Yes        Need for Family Participation in Patient Care: Yes (Comment) Care giver support system in place?: Yes (comment) Current home services: DME (hospital bed/ lift/ wheelchair/) Criminal Activity/Legal Involvement Pertinent to Current Situation/Hospitalization: No - Comment as needed  Activities of Daily Living   ADL Screening (condition at time of  admission) Independently performs ADLs?: No Does the patient have a NEW difficulty with bathing/dressing/toileting/self-feeding that is expected to last >3 days?: No Does the patient have a NEW difficulty with getting in/out of bed, walking, or climbing stairs that is expected to last >3 days?: No Does the patient have a NEW difficulty with communication that is expected to last >3 days?: No Is the patient deaf or have difficulty hearing?: No Does the patient have difficulty seeing, even when wearing glasses/contacts?: No Does the patient have difficulty concentrating, remembering, or making decisions?: No  Permission Sought/Granted                  Emotional Assessment Appearance:: Appears older than stated age Attitude/Demeanor/Rapport: Lethargic       Psych Involvement: No (comment)  Admission diagnosis:  Seizure [facility]) [R56.9] Patient Active Problem List   Diagnosis Date Noted   Seizure (HCC) 04/06/2024   Palliative care status 12/26/2023   History of pulmonary embolism 11/28/2023   Controlled type 2 diabetes mellitus with hypoglycemia (HCC) 11/27/2023   Recurrent strokes (HCC) 05/20/2023   Anemia of chronic renal failure 12/19/2022   (HFpEF) heart failure with preserved ejection fraction (HCC) 11/25/2022   Auditory hallucination 07/26/2022   Carotid artery stenosis, asymptomatic, left 05/04/2022   Anemia of chronic disease 03/23/2022   Chronic diastolic CHF (congestive heart failure) (HCC) 03/23/2022  Closed left ankle fracture 01/26/2022   Type 2 diabetes mellitus with hyperglycemia (HCC) 01/07/2022   Acute kidney injury superimposed on chronic kidney disease 11/19/2021   Frequent urination 07/13/2021   Change in mental status 07/13/2021   Gait abnormality 07/13/2021   Memory loss 07/13/2021   ESRD on hemodialysis (HCC) 07/13/2021   Normocytic anemia 07/13/2021   Diabetic nephropathy (HCC) 05/28/2020   Diabetic retinopathy (HCC) 05/28/2020   Presence of insulin   pump (external) (internal) 05/28/2020   Diabetic macular edema of right eye with proliferative retinopathy associated with type 2 diabetes mellitus (HCC) 04/16/2020   Macular pucker, right eye 04/16/2020   Vitreous hemorrhage of left eye (HCC) 04/16/2020   Pre-op examination 03/13/2020   Chronic venous insufficiency 02/06/2020   S/P TKR (total knee replacement), right 03/23/2019 05/07/2019   Primary osteoarthritis of right knee 04/02/2019   Unilateral primary osteoarthritis, right knee    Pedal edema 02/25/2019   Uncontrolled type 2 diabetes mellitus with hyperglycemia, with long-term current use of insulin  (HCC) 10/08/2018   Former smoker 10/08/2018   Carotid artery disease 03/15/2018   History of CVA (cerebrovascular accident) 11/16/2017   Low back pain 10/26/2017   Screening mammogram, encounter for 09/18/2017   Coronary artery disease involving native coronary artery of native heart without angina pectoris 11/04/2015   NSTEMI (non-ST elevated myocardial infarction) (HCC) 11/03/2015   Leukocytosis 08/16/2010   Acquired hypothyroidism 07/06/2010   HLD (hyperlipidemia) 08/28/2008   Essential hypertension 11/13/2007   Proliferative diabetic retinopathy of left eye with macular edema associated with type 2 diabetes mellitus (HCC) 09/21/2006   Diabetic peripheral neuropathy (HCC) 09/21/2006   Obesity (BMI 30-39.9) 09/21/2006   Anxiety 09/21/2006   Adjustment disorder with mixed anxiety and depressed mood 09/21/2006   CARPAL TUNNEL SYNDROME 09/21/2006   COPD (chronic obstructive pulmonary disease) (HCC) 09/21/2006   MIGRAINES, HX OF 09/21/2006   PCP:  Randeen Laine LABOR, MD Pharmacy:   [facility] [address], [place] [address] [place] [address] [place] [address] Phone: [phone] Fax: [phone]     Social Drivers of Health (SDOH) Social History: SDOH Screenings   Food Insecurity: No Food Insecurity (04/06/2024)  Housing: Low Risk  (04/06/2024)   Transportation Needs: No Transportation Needs (04/06/2024)  Utilities: Not At Risk (04/06/2024)  Alcohol  Screen: Low Risk  (03/07/2023)  Depression (PHQ2-9): Low Risk  (03/12/2024)  Financial Resource Strain: Low Risk  (03/12/2024)  Physical Activity: Inactive (03/07/2023)  Social Connections: Moderately Isolated (04/06/2024)  Stress: Stress Concern Present (03/12/2024)  Tobacco Use: Medium Risk (04/06/2024)  Health Literacy: Patient Unable To Answer (03/12/2024)   SDOH Interventions:     Readmission Risk Interventions    12/11/2023    1:59 PM  Readmission Risk Prevention Plan  Transportation Screening Complete  Medication Review (RN Care Manager) Complete  PCP or Specialist appointment within 3-5 days of discharge Complete  HRI or Home Care Consult Complete  SW Recovery Care/Counseling Consult Complete  Palliative Care Screening Not Applicable  Skilled Nursing Facility Patient Refused

## 2024-04-10 NOTE — Evaluation (Signed)
 Occupational Therapy Evaluation Patient Details Name: Jennifer [name] MRN: [id number] DOB: 1955/09/10 Today's Date: 04/10/2024   History of Present Illness   68 y.o. female presents to [facility] hospital on 04/06/2024 with R gaze deviation and decreased responsiveness. CT head negative for hemorrhage. Pt admitted with concern for seizure. PMH:  HTN, CAD, CVA, DM2, ESRD on HD, hypothyroidism, anxiety, depression, and memory loss.     Clinical Impressions Pt greeted in supine, supportive husband at bedside. PTA, pt was intermittently able to feed herself, otherwise was total care for all ADLs and mobility via hoyer lift. Family propel pt in wheelchair and provide all transportation 24/7 assist. Pt with flat affect, inconsistently following 1-step commands. She appears to be at her baseline and does not warrant any further acute OT needs. Will sign-off. No follow-up OT needs.     If plan is discharge home, recommend the following:   Two people to help with walking and/or transfers;A lot of help with bathing/dressing/bathroom;Assistance with cooking/housework;Assistance with feeding;Direct supervision/assist for medications management;Direct supervision/assist for financial management;Assist for transportation;Help with stairs or ramp for entrance;Supervision due to cognitive status     Functional Status Assessment         Equipment Recommendations   None recommended by OT (pt/family has adequate DME)     Recommendations for Other Services         Precautions/Restrictions   Precautions Precautions: Fall Recall of Precautions/Restrictions: Impaired Restrictions Weight Bearing Restrictions Per Provider Order: No     Mobility Bed Mobility Overal bed mobility: Needs Assistance Bed Mobility: Rolling Rolling: Total assist, +2 for physical assistance, Used rails         General bed mobility comments: rolled bilaterally, does reach to bed rail once prompted    Transfers                    General transfer comment: deferred, pt gets OOB via hoyer lift at baseline      Balance Overall balance assessment: Mild deficits observed, not formally tested                                         ADL either performed or assessed with clinical judgement   ADL Overall ADL's : At baseline                                       General ADL Comments: total A for all ADLs     Vision         Perception         Praxis         Pertinent Vitals/Pain Pain Assessment Pain Assessment: Faces Faces Pain Scale: No hurt     Extremity/Trunk Assessment Upper Extremity Assessment Upper Extremity Assessment: Generalized weakness   Lower Extremity Assessment Lower Extremity Assessment: Defer to PT evaluation RLE Deficits / Details: No movement to command or spontaneously. Limited knee/hip PROM due to stiffness, unable to reach 90 degrees LLE Deficits / Details: Slight ankle DF with cueing. Limited knee/hip PROM due to stiffness, unable to reach 90 degrees       Communication Communication Communication: Impaired Factors Affecting Communication: Difficulty expressing self   Cognition Arousal: Alert Behavior During Therapy: Flat affect Cognition: History of cognitive impairments  OT - Cognition Comments: dementia per chart                   Following commands impaired: Follows one step commands inconsistently, Follows one step commands with increased time     Cueing  General Comments   Cueing Techniques: Verbal cues;Tactile cues  supportive husband present   Exercises     Shoulder Instructions      Home Living Family/patient expects to be discharged to:: Private residence Living Arrangements: Children;Spouse/significant other Available Help at Discharge: Family;Available 24 hours/day Type of Home: House Home Access: Ramped entrance     Home Layout: One level     Bathroom  Shower/Tub: Chief Strategy Officer: Standard Bathroom Accessibility: Yes   Home Equipment: Agricultural Consultant (2 wheels);Cane - single point;BSC/3in1;Shower seat;Hospital bed;Wheelchair - manual;Grab bars - tub/shower          Prior Functioning/Environment Prior Level of Function : Needs assist       Physical Assist : ADLs (physical);Mobility (physical) Mobility (physical): Transfers;Gait;Stairs;Bed mobility ADLs (physical): Feeding;Grooming;Bathing;Dressing;Toileting;IADLs Mobility Comments: uses hoyer lift to manual WC, family assists with propelling WC ADLs Comments: occasionally can feed self, assist for all ADLs. Typically wears depends with bird baths in the bed    OT Problem List: Decreased strength;Decreased range of motion;Decreased activity tolerance;Impaired balance (sitting and/or standing);Decreased coordination;Decreased cognition;Decreased safety awareness;Impaired tone;Impaired UE functional use   OT Treatment/Interventions:        OT Goals(Current goals can be found in the care plan section)   Acute Rehab OT Goals Patient Stated Goal: pt did not state   OT Frequency:       Co-evaluation              AM-PAC OT 6 Clicks Daily Activity     Outcome Measure Help from another person eating meals?: Total Help from another person taking care of personal grooming?: Total Help from another person toileting, which includes using toliet, bedpan, or urinal?: Total Help from another person bathing (including washing, rinsing, drying)?: Total Help from another person to put on and taking off regular upper body clothing?: Total Help from another person to put on and taking off regular lower body clothing?: Total 6 Click Score: 6   End of Session    Activity Tolerance: Patient limited by fatigue Patient left: in bed;with call bell/phone within reach;with bed alarm set;with family/visitor present;with restraints reapplied;with SCD's reapplied  OT  Visit Diagnosis: Muscle weakness (generalized) (M62.81);Cognitive communication deficit (R41.841) Symptoms and signs involving cognitive functions: Other cerebrovascular disease                Time: 1003-1023 OT Time Calculation (min): 20 min Charges:  OT General Charges $OT Visit: 1 Visit OT Evaluation $OT Eval Low Complexity: 1 Low  Rikki BIRCH., MSOT, OTR/L Acute Rehabilitation Services [phone] Secure Chat Preferred  Rikki Milch 04/10/2024, 12:40 PM

## 2024-04-10 NOTE — Progress Notes (Signed)
  Progress Note   Patient: Jennifer [name] FMW:[id number] DOB: Dec 01, 1955 DOA: 04/06/2024     3 DOS: the patient was seen and examined on 04/10/2024   Brief hospital course: 68 y.o. 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  Given response to Ativan , latter seems to be more plausible - Neurology consulted, appreciate input.  She was started on Keppra, continue - Continue seizure precautions, LTM EEG did not show any seizures - MRI of the brain negative for acute findings   Active problems Hemorrhagic cystitis - patient was diagnosed with a UTI, has completed Bactrim and currently is on Keflex  for persistent hematuria.  She has also been passing a significant amount of blood clots.  Urology consulted, underwent CBI, hematuria clearing. - Urine cultures with Klebsiella resistant to ampicillin, cefazolin , nitrofurantoin, Bactrim, Unasyn and Zosyn.  Continue ceftriaxone  for now -Seen with Urology, ruling out constipation. Once resolved from GI standpoint, then OK to remove urinary catheter per Urology   ESRD -nephrology consulted, appreciate input.  HD yesterday   History of PE-Eliquis  is on hold due to persistent hematuria, will resume once okay with urology   Vascular dementia -she has good days and bad days per patient's daughters.  Resume home meds   Essential hypertension -continue home medications, pressure stabilized   Hypothyroidism-resume Synthroid    Depression-continue home medications except  Wellbutrin  which will be held per neurology   Hyperlipidemia-resume statin   Right shin wound-monitor, does not appear infected  Abd pain-abd feels distended on exam. Suspicion for constipation. Abd xray was ordered     Subjective: Complained of mild abd pain  Physical Exam: Vitals:   04/10/24 1137 04/10/24 1415 04/10/24 1428 04/10/24 1500  BP: (!) 140/54 (!) 137/56 (!) 137/55 (!) 126/53  Pulse: 72 73 70 71  Resp:  13 11 10   Temp: 98.3 F (36.8 C)     TempSrc: Axillary     SpO2: 97% 98% 97% 99%  Weight:  83.1 kg    Height:       General exam: Awake, laying in bed, in nad Respiratory system: Normal respiratory effort, no wheezing Cardiovascular system: regular rate, s1, s2 Gastrointestinal system: mildliy distended, positive BS Central nervous system: CN2-12 grossly intact, strength intact Extremities: Perfused, no clubbing Skin: Normal skin turgor, no notable skin lesions seen Psychiatry: Mood normal // affect seems normal   Data Reviewed:  Labs reviewed: Na 130, K 4.0, Cr 5.46, WBC 6.5, Hgb 9.1, Plts 223  Family Communication: Pt in room, family at bedside  Disposition: Status is: Inpatient Remains inpatient appropriate because: severity of illness  Planned Discharge Destination: Home    Author: Garnette Pelt, MD 04/10/2024 3:08 PM  For on call review [email or web address].

## 2024-04-10 NOTE — Evaluation (Signed)
 Physical Therapy Evaluation Patient Details Name: Jennifer [name] MRN: [id number] DOB: [birth year] Today's Date: 04/10/2024  History of Present Illness  68 y.o. female presents to [facility] hospital on 04/06/2024 with R gaze deviation and decreased responsiveness. CT head negative for hemorrhage. Pt admitted with concern for seizure. PMH:  HTN, CAD, CVA, DM2, ESRD on HD, hypothyroidism, anxiety, depression, and memory loss.   Clinical Impression  PTA pt had 24/7 assist and would transfer to manual WC using hoyer lift. Pt had assist for all ADLs and would occasionally self feed. History taken from pt's husband as pt had limited verbalizations during eval. Pt presents with R gaze preference with ability to reach neutral alignment with cueing. Pt also with generalized deconditioning and stiffness in UE/LE. Required TotalAx2 to roll with pt able to grip bed rail once in sidelying. Discussed performing PROM, changing positions for pressure relief, and techniques to prevent delirium. Pt/family feel comfortable discharging home when medically stable with current set-up. Pt is at or close to baseline with no further acute or post-acute PT needs. Acute PT signing off.         If plan is discharge home, recommend the following: A lot of help with walking and/or transfers;A lot of help with bathing/dressing/bathroom;Assistance with cooking/housework;Direct supervision/assist for medications management;Direct supervision/assist for financial management;Assist for transportation;Help with stairs or ramp for entrance   Can travel by private vehicle    No    Equipment Recommendations None recommended by PT     Functional Status Assessment Patient has not had a recent decline in their functional status     Precautions / Restrictions Precautions Precautions: Fall Recall of Precautions/Restrictions: Impaired Restrictions Weight Bearing Restrictions Per Provider Order: No      Mobility  Bed Mobility Overal  bed mobility: Needs Assistance Bed Mobility: Rolling Rolling: Total assist, +2 for physical assistance, Used rails    General bed mobility comments: TotalAx2 to roll with use of bed pad. Able to grip bed rails once in sidelying    Transfers  General transfer comment: Deferred, hoyer lift at baseline       Balance Overall balance assessment: Mild deficits observed, not formally tested       Pertinent Vitals/Pain Pain Assessment Pain Assessment: Faces Faces Pain Scale: Hurts a little bit Pain Location: verbalizations with PROM to LLE Pain Descriptors / Indicators: Discomfort, Grimacing, Guarding Pain Intervention(s): Limited activity within patient's tolerance, Monitored during session, Repositioned    Home Living Family/patient expects to be discharged to:: Private residence Living Arrangements: Children;Spouse/significant other Available Help at Discharge: Family;Available 24 hours/day Type of Home: House Home Access: Ramped entrance       Home Layout: One level Home Equipment: Agricultural Consultant (2 wheels);Cane - single point;BSC/3in1;Shower seat;Hospital bed;Wheelchair - manual;Grab bars - tub/shower      Prior Function Prior Level of Function : Needs assist    Mobility Comments: uses hoyer lift to manual WC, family assists with propelling WC ADLs Comments: occasionally can feed self, assist for all ADLs. Typically wears depends with bird baths in the bed     Extremity/Trunk Assessment   Upper Extremity Assessment Upper Extremity Assessment: Defer to OT evaluation    Lower Extremity Assessment Lower Extremity Assessment: RLE deficits/detail;LLE deficits/detail RLE Deficits / Details: No movement to command or spontaneously. Limited knee/hip PROM due to stiffness, unable to reach 90 degrees LLE Deficits / Details: Slight ankle DF with cueing. Limited knee/hip PROM due to stiffness, unable to reach 90 degrees  Communication   Communication Communication:  Impaired Factors Affecting Communication: Difficulty expressing self    Cognition Arousal: Alert Behavior During Therapy: Flat affect   PT - Cognitive impairments: History of cognitive impairments    PT - Cognition Comments: would occasionally verbalize yes or respond with short statement. Husband reported decreased interaction since in the hospital Following commands: Impaired Following commands impaired: Follows one step commands inconsistently, Follows one step commands with increased time     Cueing Cueing Techniques: Verbal cues, Tactile cues     General Comments General comments (skin integrity, edema, etc.): Husband present and supportive during eval. Discussed changing positions for pressure relief, performing PROM, and delirium prevention     PT Assessment Patient does not need any further PT services         PT Goals (Current goals can be found in the Care Plan section)  Acute Rehab PT Goals PT Goal Formulation: All assessment and education complete, DC therapy     AM-PAC PT 6 Clicks Mobility  Outcome Measure Help needed turning from your back to your side while in a flat bed without using bedrails?: Total Help needed moving from lying on your back to sitting on the side of a flat bed without using bedrails?: Total Help needed moving to and from a bed to a chair (including a wheelchair)?: Total Help needed standing up from a chair using your arms (e.g., wheelchair or bedside chair)?: Total Help needed to walk in hospital room?: Total Help needed climbing 3-5 steps with a railing? : Total 6 Click Score: 6    End of Session   Activity Tolerance: Patient tolerated treatment well Patient left: in bed;with call bell/phone within reach;with bed alarm set;with family/visitor present;with nursing/sitter in room Nurse Communication: Mobility status;Need for lift equipment PT Visit Diagnosis: Other abnormalities of gait and mobility (R26.89)    Time: 8996-8976 PT  Time Calculation (min) (ACUTE ONLY): 20 min   Charges:   PT Evaluation $PT Eval Low Complexity: 1 Low   PT General Charges $$ ACUTE PT VISIT: 1 Visit        Kate ORN, PT, DPT Secure Chat Preferred  Rehab Office [phone]   Kate BRAVO Wendolyn 04/10/2024, 10:37 AM

## 2024-04-10 NOTE — Progress Notes (Signed)
     Subjective: Jennifer [name].  Clear yellow urine today.  A few flecks of old blood.  Objective: Vital signs in last 24 hours: Temp:  [97.6 F (36.4 C)-98.4 F (36.9 C)] 98.3 F (36.8 C) (10/29 1137) Pulse Rate:  [72-78] 72 (10/29 1137) Resp:  [15] 15 (10/28 1523) BP: (140-150)/(54-65) 140/54 (10/29 1137) SpO2:  [95 %-98 %] 97 % (10/29 1137)  Assessment/Plan: 68 year old female with ESRD, CAD, CVA, dementia, T2DM, and hematuria  # Gross hematuria  lobulated hyperdensity in the bladder around 10 cm on 10/22.  Interval improvement in clot burden on repeat CT.  No focal mass on CT Hematuria 10/25.  CBI clamped overnight.  Hand irrigation with TXA bladder dwell.  Thankfully patient responded well and hematuria has resolved as of this morning.  Voiding trial at the discretion of primary team.  Patient is constipated and they will try to address that prior to removing the catheter.  Outpt cystoscopy with Dr. Lovie  Allergy will sign off at this time.  Please feel free to call with questions, concerns, or acute urologic changes.  Intake/Output from previous day: 10/28 0701 - 10/29 0700 In: 936 [P.O.:836] Out: 200 [Urine:200]  Intake/Output this shift: No intake/output data recorded.  Physical Exam:  General: Sleeping CV: No cyanosis Lungs: equal chest rise Abdomen: Soft, NTND, no rebound or guarding Gu: 4f Three-way Foley catheter in place draining clear yellow urine  Lab Results: Recent Labs    04/09/24 0228 04/10/24 0248  HGB 9.0* 9.1*  HCT 28.4* 29.4*   BMET Recent Labs    04/09/24 0228 04/10/24 0248  NA 134* 130*  K 3.6 4.0  CL 97* 92*  CO2 26 26  GLUCOSE 131* 119*  BUN 16 26*  CREATININE 4.52* 5.46*  CALCIUM  8.1* 8.4*  HGB 9.0* 9.1*  WBC 8.9 6.5     Studies/Results: No results found.     LOS: 3 days   Ole Bourdon, NP [facility] Pager: [phone]  04/10/2024, 12:55 PM

## 2024-04-10 NOTE — Hospital Course (Signed)
 68 y.o. 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

## 2024-04-10 NOTE — Progress Notes (Signed)
 Received patient in bed to unit.  Alert and oriented.  Informed consent signed and in chart.   TX duration:3.5 hours  Patient tolerated well.  Transported back to the room  Alert, without acute distress.  Hand-off given to patient's nurse.   Access used: Right upper arm fistula Access issues: none  Total UF removed: 2L   04/10/24 1800  Vitals  Temp 97.9 F (36.6 C)  BP (!) 108/50  MAP (mmHg) 66  Pulse Rate 91  ECG Heart Rate 92  Resp 15  Weight 81.1 kg  Type of Weight Post-Dialysis  Oxygen  Therapy  SpO2 95 %  O2 Device Room Air  During Treatment Monitoring  Duration of HD Treatment -hour(s) 3.5 hour(s)  HD Safety Checks Performed Yes  Intra-Hemodialysis Comments Tx completed  Post Treatment  Dialyzer Clearance Clear  Liters Processed 84  Fluid Removed (mL) 2000 mL  Tolerated HD Treatment Yes  Post-Hemodialysis Comments tolerated well  AVG/AVF Arterial Site Held (minutes) 7 minutes  AVG/AVF Venous Site Held (minutes) 7 minutes  Fistula / Graft Right Upper arm Arteriovenous fistula  Placement Date/Time: 06/28/22 0818   Placed prior to admission: No  Orientation: Right  Access Location: Upper arm  Access Type: Arteriovenous fistula  Site Condition No complications  Fistula / Graft Assessment Present;Thrill;Bruit  Status Patent;Deaccessed  Drainage Description None     Camellia Brasil LPN [facility]

## 2024-04-10 NOTE — Progress Notes (Signed)
 Speech Language Pathology Treatment: Dysphagia  Patient Details Name: Jennifer [name] MRN: [id number] DOB: 1956-04-17 Today's Date: 04/10/2024 Time: 8877-8863 SLP Time Calculation (min) (ACUTE ONLY): 14 min  Assessment / Plan / Recommendation Clinical Impression  Patient was alert and awake upon SLP arrival. SLP brought diced pears to observe patient eat. RN shared that she ate her breakfast this morning but ate slowly. Patient exhibited prolonged mastication with pears. Patient's swallow initiation observed to be dealyed as well. SLP is recommending patient continue with Dys 2 (minced) thin diet. SLP will continue to follow for diet toleration and advancement.   HPI HPI: 68 yo female presenting 10/25 with R gaze deviation and decreased responsiveness. CTH negative for acute changes. Work up still ongoing although differential includes stroke vs seizures with latter favored per MD given positive response to ativan . PMH includes: dementia, HTN, HLD, CAD, DM, CVA, CHF, ESRD. Pt has been seen by SLP multiple times in the past, typically with an oral more than pharyngeal dysphagia (no aspiration on MBS in 2024) requiring softer solids but able to consume thin liquids.      SLP Plan  Continue with current plan of care          Recommendations  Diet recommendations: Dysphagia 2 (fine chop);Thin liquid Liquids provided via: Teaspoon;Cup;Straw Medication Administration: Crushed with puree Supervision: Staff to assist with self feeding;Full supervision/cueing for compensatory strategies Compensations: Minimize environmental distractions;Slow rate;Small sips/bites Postural Changes and/or Swallow Maneuvers: Seated upright 90 degrees;Upright 30-60 min after meal                  Oral care BID   Other (comment) (tbd) Dysphagia, unspecified (R13.10)     Continue with current plan of care    Damien Hy  Graduate SLP Clinican

## 2024-04-10 NOTE — Care Management Important Message (Signed)
 Important Message  Patient Details  Name: Jennifer [name] MRN: [id number] Date of Birth: February 15, 1956   Important Message Given:  Yes - Medicare IM     Claretta Deed 04/10/2024, 2:52 PM

## 2024-04-11 DIAGNOSIS — R569 Unspecified convulsions: Secondary | ICD-10-CM | POA: Diagnosis not present

## 2024-04-11 LAB — GLUCOSE, CAPILLARY
Glucose-Capillary: 121 mg/dL — ABNORMAL HIGH (ref 70–99)
Glucose-Capillary: 179 mg/dL — ABNORMAL HIGH (ref 70–99)
Glucose-Capillary: 156 mg/dL — ABNORMAL HIGH (ref 70–99)
Glucose-Capillary: 210 mg/dL — ABNORMAL HIGH (ref 70–99)

## 2024-04-11 MED ORDER — CHLORHEXIDINE GLUCONATE CLOTH 2 % EX PADS: 6.0000 | MEDICATED_PAD | Freq: Every day | CUTANEOUS | Status: DC

## 2024-04-11 NOTE — Plan of Care (Signed)

## 2024-04-11 NOTE — Progress Notes (Signed)
  Progress Note   Patient: Jennifer [name] FMW:[id number] DOB: [birth year] DOA: 04/06/2024     4 DOS: the patient was seen and examined on 04/11/2024   Brief hospital course: 68 y.o. 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  Given response to Ativan , latter seems to be more plausible - Neurology consulted, appreciate input.  She was started on Keppra, continue - Continue seizure precautions, LTM EEG did not show any seizures - MRI of the brain negative for acute findings   Active problems Hemorrhagic cystitis - patient was diagnosed with a UTI, has completed Bactrim and currently is on Keflex  for persistent hematuria.  She has also been passing a significant amount of blood clots.  Urology consulted, underwent CBI, hematuria clearing. - Urine cultures with Klebsiella resistant to ampicillin, cefazolin , nitrofurantoin, Bactrim, Unasyn and Zosyn.  Continue ceftriaxone  for now -Seen with Urology, ruling out constipation.  -Large BM noted 10/29.  -Will d/c foley and perform voiding trial today   ESRD -nephrology consulted, appreciate input.  HD yesterday   History of PE-Eliquis  is on hold due to persistent hematuria, will resume once okay with urology   Vascular dementia -she has good days and bad days per patient's daughters.  Resume home meds   Essential hypertension -continue home medications, pressure stabilized   Hypothyroidism-resume Synthroid    Depression-continue home medications except Wellbutrin   which will be held per neurology   Hyperlipidemia-resume statin   Right shin wound-monitor, does not appear infected  Abd pain-stool noted on xray without obstruction. Large BM noted 10/29 per RN     Subjective: Wanting foley removed  Physical Exam: Vitals:   04/10/24 2333 04/11/24 0345 04/11/24 0829 04/11/24 1100  BP: (!) 123/54 (!) 124/52 (!) 148/67   Pulse: 81  81 (P) 70  Resp:   12   Temp: 97.9 F (36.6 C)  98.6 F (37 C) (P) 99 F (37.2 C)  TempSrc: Axillary Axillary Axillary (P) Axillary  SpO2: 92% 94% 95% (P) 95%  Weight:      Height:       General exam: Conversant, in no acute distress Respiratory system: normal chest rise, clear, no audible wheezing Cardiovascular system: regular rhythm, s1-s2 Gastrointestinal system: Nondistended, nontender, pos BS Central nervous system: No seizures, no tremors Extremities: No cyanosis, no joint deformities Skin: No rashes, no pallor Psychiatry: Affect normal // no auditory hallucinations   Data Reviewed:  There are no new results to review at this time.  Family Communication: Pt in room, family not at bedside  Disposition: Status is: Inpatient Remains inpatient appropriate because: severity of illness  Planned Discharge Destination: Home    Author: Garnette Pelt, MD 04/11/2024 3:18 PM  For on call review [email or web address].

## 2024-04-11 NOTE — Progress Notes (Addendum)
 Case discussed with attending. Pt is for possible d/c tomorrow. Contacted renal NP to inquire if it would be appropriate to request 2nd shift HD tomorrow inpt in the event pt stable for d/c in the am and can complete HD at home (pt is a home HD pt). Renal NP responded that is a possibility. Contacted inpt HD unit to request that pt be placed on 2nd shift tomorrow if possible in the event pt stable for d/c tomorrow am. Will f/u with staff in the am. Will assist as needed.   Randine Mungo Dialysis Navigator [phone]

## 2024-04-11 NOTE — Progress Notes (Addendum)
 Pt has gotten 5d of ceftriaxone  for UTI. Ok to stop ceftriaxone  [per Dr Cindy.  Sergio Batch, PharmD, BCIDP, AAHIVP, CPP Infectious Disease Pharmacist 04/11/2024 11:55 AM

## 2024-04-11 NOTE — TOC Progression Note (Signed)
 Transition of Care [facility]) - Progression Note    Patient Details  Name: Jennifer [name] MRN: [id number] Date of Birth: 1955/11/21  Transition of Care [facility]) CM/SW Contact  Andrez JULIANNA George, RN Phone Number: 04/11/2024, 2:07 PM  Clinical Narrative:     Pt will need PTAR transport home per daughter when she is discharged. CM has verified the home address in the chart.  IP Care management following.  Expected Discharge Plan: Home/Self Care Barriers to Discharge: Continued Medical Work up               Expected Discharge Plan and Services       Living arrangements for the past 2 months: Single Family Home                                       Social Drivers of Health (SDOH) Interventions SDOH Screenings   Food Insecurity: No Food Insecurity (04/06/2024)  Housing: Low Risk  (04/06/2024)  Transportation Needs: No Transportation Needs (04/06/2024)  Utilities: Not At Risk (04/06/2024)  Alcohol  Screen: Low Risk  (03/07/2023)  Depression (PHQ2-9): Low Risk  (03/12/2024)  Financial Resource Strain: Low Risk  (03/12/2024)  Physical Activity: Inactive (03/07/2023)  Social Connections: Moderately Isolated (04/06/2024)  Stress: Stress Concern Present (03/12/2024)  Tobacco Use: Medium Risk (04/06/2024)  Health Literacy: Patient Unable To Answer (03/12/2024)    Readmission Risk Interventions    12/11/2023    1:59 PM  Readmission Risk Prevention Plan  Transportation Screening Complete  Medication Review (RN Care Manager) Complete  PCP or Specialist appointment within 3-5 days of discharge Complete  HRI or Home Care Consult Complete  SW Recovery Care/Counseling Consult Complete  Palliative Care Screening Not Applicable  Skilled Nursing Facility Patient Refused

## 2024-04-11 NOTE — Progress Notes (Addendum)
  [facility] Progress Note   Subjective:    Seen and examined patient at bedside. Patient's husband is also at bedside. No acute issues. Tolerated yesterday's HD with net UF 2L. Next HD 10/31.  Objective Vitals:   04/10/24 2333 04/11/24 0345 04/11/24 0829 04/11/24 1100  BP: (!) 123/54 (!) 124/52 (!) 148/67   Pulse: 81  81 (P) 70  Resp:   12   Temp: 97.9 F (36.6 C)  98.6 F (37 C) (P) 99 F (37.2 C)  TempSrc: Axillary Axillary Axillary (P) Axillary  SpO2: 92% 94% 95% (P) 95%  Weight:      Height:       Physical Exam General: Alert female in NAD Heart: RRR, no murmurs, rubs or gallops Lungs: CTA bilaterally, respirations unlabored Abdomen: Soft, non-distended, +BS Extremities: No edema b/l lower extremities Dialysis Access:  AVF + t/b  Filed Weights   04/08/24 1840 04/10/24 1415 04/10/24 1800  Weight: 81.7 kg 83.1 kg 81.1 kg    Intake/Output Summary (Last 24 hours) at 04/11/2024 1359 Last data filed at 04/11/2024 0600 Gross per 24 hour  Intake --  Output 2100 ml  Net -2100 ml    Additional Objective Labs: Basic Metabolic Panel: Recent Labs  Lab 04/07/24 0327 04/09/24 0228 04/10/24 0248  NA 137 134* 130*  K 3.0* 3.6 4.0  CL 96* 97* 92*  CO2 28 26 26   GLUCOSE 74 131* 119*  BUN 24* 16 26*  CREATININE 5.42* 4.52* 5.46*  CALCIUM  8.5* 8.1* 8.4*  PHOS 2.6  --   --    Liver Function Tests: Recent Labs  Lab 04/07/24 0327 04/09/24 0228 04/10/24 0248  AST 13* 14* 17  ALT 8 8 7   ALKPHOS 70 72 64  BILITOT 0.8 0.4 0.4  PROT 5.1* 5.6* 5.3*  ALBUMIN  2.9* 3.0* 2.8*   No results for input(s): LIPASE, AMYLASE in the last 168 hours. CBC: Recent Labs  Lab 04/06/24 1251 04/07/24 0327 04/09/24 0228 04/10/24 0248  WBC 6.5 4.2 8.9 6.5  NEUTROABS 5.2  --   --   --   HGB 9.8* 8.7* 9.0* 9.1*  HCT 31.0* 27.4* 28.4* 29.4*  MCV 92.0 89.8 90.2 92.2  PLT 249 229 249 223   Blood Culture    Component Value Date/Time   SDES URINE, CLEAN CATCH  04/06/2024 1830   SPECREQUEST  04/06/2024 1830    NONE Performed at [facility], [address] [place], [place] [address]    CULT (A) 04/06/2024 1830    >=100,000 COLONIES/mL KLEBSIELLA PNEUMONIAE 50,000 COLONIES/mL ESCHERICHIA COLI    REPTSTATUS 04/10/2024 FINAL 04/06/2024 1830    Cardiac Enzymes: No results for input(s): CKTOTAL, CKMB, CKMBINDEX, TROPONINI in the last 168 hours. CBG: Recent Labs  Lab 04/10/24 1130 04/10/24 1831 04/10/24 2031 04/11/24 0847 04/11/24 1110  GLUCAP 163* 125* 180* 121* 179*   Iron Studies: No results for input(s): IRON, TIBC, TRANSFERRIN, FERRITIN in the last 72 hours. Lab Results  Component Value Date   INR 1.2 04/06/2024   INR 1.4 (H) 01/24/2024   INR 1.8 (H) 12/07/2023   Studies/Results: DG Abd 1 View Result Date: 04/10/2024 CLINICAL DATA:  Constipation. EXAM: ABDOMEN - 1 VIEW COMPARISON:  CT 04/06/2024 FINDINGS: Small to moderate colonic stool burden. No small bowel distension or evidence of obstruction. Cholecystectomy clips in the right upper quadrant. No radiopaque calculi. Degenerative change in the spine and hips. IMPRESSION: Small to moderate colonic stool burden. No bowel obstruction. Electronically Signed   By:  Andrea Gasman M.D.   On: 04/10/2024 15:06    Medications:  sodium chloride  irrigation      amLODipine   5 mg Oral Daily   atorvastatin   80 mg Oral QPM   carvedilol   6.25 mg Oral BID WC   Chlorhexidine  Gluconate Cloth  6 each Topical Q0600   darbepoetin (ARANESP ) injection - DIALYSIS  150 mcg Subcutaneous Q Tue-1800   DULoxetine   60 mg Oral Daily   feeding supplement  237 mL Oral BID BM   insulin  aspart  0-6 Units Subcutaneous TID WC   levETIRAcetam  500 mg Oral Daily   levETIRAcetam  500 mg Oral Q M,W,F-1800   levothyroxine   175 mcg Oral QAC breakfast   memantine   10 mg Oral BID    Dialysis Orders: HHD, home clinic: [facility]). BFR 400, DFR 16.4L/hr. EDW 86.5. 2k, 45 lactate. AVF 15g  (buttonholes). No heparin . Meds: venofer 100mg , last mircera 225mcg on 9/24   Assessment/Plan: 1. Encephalopathy: partial complex seizure, per neuro.  2. ESRD: typically does HD on Tues, Wed, Sat, Sun. Keeping her on a MWF schedule here 3. HTN/volume:  BP elevated but imrpoved post HD. Continue home meds. Under EDW here, consider lowering EDW at discharge. 4. Anemia:  Hgb 9.1, hemorrhagic cystitis likely contributing. Tsat 39%. Aranesp  150mcg given 10/28. 5. Secondary hyperparathyroidism:  Calcium  and phos controlled. No binder 7. Hemorrhagic cystitis: Urology following, appears CBI is now off.  8. Hypokalemia: K+ low initially, improved with 4K bath with HD. Continue added K bath per protocol. Recent K+ at goal.  Charmaine Piety, NP [facility] 04/11/2024,1:59 PM  LOS: 4 days

## 2024-04-11 NOTE — Discharge Instructions (Signed)
 Information on my medicine - ELIQUIS (apixaban)  This medication education was reviewed with me or my healthcare representative as part of my discharge preparation.    Why was Eliquis prescribed for you? Eliquis was prescribed to treat blood clots that may have been found in the veins of your legs (deep vein thrombosis) or in your lungs (pulmonary embolism) and to reduce the risk of them occurring again.  What do You need to know about Eliquis ? Continue Eliquis 5 mg tablet taken TWICE daily.  Eliquis may be taken with or without food.   Try to take the dose about the same time in the morning and in the evening. If you have difficulty swallowing the tablet whole please discuss with your pharmacist how to take the medication safely.  Take Eliquis exactly as prescribed and DO NOT stop taking Eliquis without talking to the doctor who prescribed the medication.  Stopping may increase your risk of developing a new blood clot.  Refill your prescription before you run out.  After discharge, you should have regular check-up appointments with your healthcare provider that is prescribing your Eliquis.    What do you do if you miss a dose? If a dose of ELIQUIS is not taken at the scheduled time, take it as soon as possible on the same day and twice-daily administration should be resumed. The dose should not be doubled to make up for a missed dose.  Important Safety Information A possible side effect of Eliquis is bleeding. You should call your healthcare provider right away if you experience any of the following: Bleeding from an injury or your nose that does not stop. Unusual colored urine (red or dark brown) or unusual colored stools (red or black). Unusual bruising for unknown reasons. A serious fall or if you hit your head (even if there is no bleeding).  Some medicines may interact with Eliquis and might increase your risk of bleeding or clotting while on Eliquis. To help avoid this,  consult your healthcare provider or pharmacist prior to using any new prescription or non-prescription medications, including herbals, vitamins, non-steroidal anti-inflammatory drugs (NSAIDs) and supplements.  This website has more information on Eliquis (apixaban): http://[email or web address].com/eliquis/home

## 2024-04-12 DIAGNOSIS — R569 Unspecified convulsions: Secondary | ICD-10-CM | POA: Diagnosis not present

## 2024-04-12 LAB — GLUCOSE, CAPILLARY
Glucose-Capillary: 151 mg/dL — ABNORMAL HIGH (ref 70–99)
Glucose-Capillary: 188 mg/dL — ABNORMAL HIGH (ref 70–99)
Glucose-Capillary: 124 mg/dL — ABNORMAL HIGH (ref 70–99)
Glucose-Capillary: 290 mg/dL — ABNORMAL HIGH (ref 70–99)

## 2024-04-12 MED ORDER — PENTAFLUOROPROP-TETRAFLUOROETH EX AERO: 1.0000 | INHALATION_SPRAY | CUTANEOUS | Status: DC | PRN

## 2024-04-12 MED ORDER — LORAZEPAM 2 MG/ML IJ SOLN
0.5000 mg | Freq: Four times a day (QID) | INTRAMUSCULAR | Status: DC | PRN
Start: 2024-04-12 — End: 2024-04-13

## 2024-04-12 MED ORDER — APIXABAN 5 MG PO TABS
5.0000 mg | ORAL_TABLET | Freq: Two times a day (BID) | ORAL | Status: DC
Start: 2024-04-12 — End: 2024-04-13
  Administered 2024-04-12 – 2024-04-13 (×3): 5 mg via ORAL
  Filled 2024-04-12 (×4): qty 1

## 2024-04-12 NOTE — Progress Notes (Signed)
 PHARMACY - ANTICOAGULATION CONSULT NOTE  Pharmacy Consult for apixaban  Indication: pulmonary embolus  Allergies  Allergen Reactions   Tnkase  [Tenecteplase ] Other (See Comments)    Brain bleed   Glucophage [Metformin] Diarrhea    Patient Measurements: Height: 5' 7 (170.2 cm) Weight: 79.7 kg (175 lb 11.3 oz) IBW/kg (Calculated) : 61.6 HEPARIN  DW (KG): 79.5  Vital Signs: Temp: 97.5 F (36.4 C) (10/31 1308) Temp Source: Oral (10/31 1105) BP: 151/76 (10/31 1512) Pulse Rate: 88 (10/31 1512)  Labs: Recent Labs    04/10/24 0248  HGB 9.1*  HCT 29.4*  PLT 223  CREATININE 5.46*    Estimated Creatinine Clearance: 10.7 mL/min (A) (by C-G formula based on SCr of 5.46 mg/dL (H)).   Medical History: Past Medical History:  Diagnosis Date   CAD (coronary artery disease)    cath 5/23 100% dist RCA lesion treated with 2 overlapping Integrity Resolute DES ( 2.25x25mm, 2.25x19mm), 60% mid RCA treated medically, 99% OM2 not amenable to PCI, 70% D1 lesion, EF normal   Hypercholesteremia    Hypertension    Hypothyroidism    Kidney disease    Myocardial infarction [facility])    Renal disorder    Stroke (HCC) 10/2018   Thyroid  disease    Type 2 diabetes mellitus (HCC) 10/08/2018     Assessment: 68 yo W with segmental left sided PE without right heart strain 11/28/23, possibly provoked due to pneumonia and being wheelchair bound. Apixaban  had been held for persistent hematuria due to cystitis. Now s/p continuous bladder irrigation. Pharmacy consulted to resume apixaban .   Discussed with MD, will resume 5mg  BID given relatively recent PE.   Goal of Therapy:  Monitor platelets by anticoagulation protocol: Yes   Plan:  Apixaban  5mg  BID Monitor signs/symptoms of bleeding    Jinnie Door, PharmD, BCPS, BCCP Clinical Pharmacist  Please check AMION for all [facility] Pharmacy phone numbers After 10:00 PM, call Main Pharmacy [phone]

## 2024-04-12 NOTE — Progress Notes (Signed)
  Progress Note   Patient: Jennifer [name] FMW:[id number] DOB: May 26, 1956 DOA: 04/06/2024     5 DOS: the patient was seen and examined on 04/12/2024   Brief hospital course: 68 y.o. 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  Given response to Ativan , latter seems to be more plausible - Neurology consulted, appreciate input.  She was started on Keppra, continue - Continue seizure precautions, LTM EEG did not show any seizures - MRI of the brain negative for acute findings   Active problems Hemorrhagic cystitis - patient was diagnosed with a UTI, has completed Bactrim and currently is on Keflex  for persistent hematuria.  She has also been passing a significant amount of blood clots.  Urology consulted, underwent CBI, hematuria clearing. - Urine cultures with Klebsiella resistant to ampicillin, cefazolin , nitrofurantoin, Bactrim, Unasyn and Zosyn.  Continue ceftriaxone  for now -Seen with Urology, ruling out constipation.  -Large BM noted 10/29.  -Foley was discontinued -Trial of eliquis  underway   ESRD -nephrology consulted, appreciate input.  Cont HD   History of PE-Eliquis  is on hold due to persistent hematuria. Discussed with Urology. Recommendation for trial of eliquis  while in hospital. If no further bleed overnight, then would cont anticoag on d/c. If bleed, then may need to consider IVC filter   Vascular dementia -she has good days and bad days per patient's daughters.  Resume home meds   Essential  hypertension -continue home medications, pressure stabilized   Hypothyroidism-resume Synthroid    Depression-continue home medications except Wellbutrin  which will be held per neurology   Hyperlipidemia-resume statin   Right shin wound-monitor, does not appear infected  Abd pain-stool noted on xray without obstruction. Large BM noted 10/29 per RN     Subjective: Eager to go home  Physical Exam: Vitals:   04/12/24 1630 04/12/24 1648 04/12/24 1651 04/12/24 1733  BP: (!) 142/66 (!) 141/66 (!) 146/60   Pulse: 92  84 91  Resp: 15 16 15    Temp:    98.2 F (36.8 C)  TempSrc:    Oral  SpO2: 97% 100% 98% 96%  Weight:      Height:       General exam: Awake, laying in bed, in nad Respiratory system: Normal respiratory effort, no wheezing Cardiovascular system: regular rate, s1, s2 Gastrointestinal system: Soft, nondistended, positive BS Central nervous system: CN2-12 grossly intact, strength intact Extremities: Perfused, no clubbing Skin: Normal skin turgor, no notable skin lesions seen Psychiatry: Mood normal // no visual hallucinations   Data Reviewed:  There are no new results to review at this time.  Family Communication: Pt in room, family over phone  Disposition: Status is: Inpatient Remains inpatient appropriate because: severity of illness  Planned Discharge Destination: Home    Author: Garnette Pelt, MD 04/12/2024 7:06 PM  For on call review [email or web address].

## 2024-04-12 NOTE — Procedures (Signed)
 HD Note:  Some information was entered later than the data was gathered due to patient care needs. The stated time with the data is accurate.  Received patient in bed to unit.   Alert.   Informed consent signed and in chart.   Access used: Upper right arm fistula Access issues: None Patient access arm reminder/restraint applied when there was 1 hour and 30 min to go into the treatment.  Patient movement of her arm was increasing the venous pressures.  Patient was unable to remember to keep arm still to allow treatment to move forward.  Patient was observed continuously during treatment and arm restraint was removed as soon as the needles were removed. Patient venous pressures continued to stay above desired limits.  [name], PA notified with response to end treatment to avoid another  cannulation to allow for continued treatment. Patient did not have any response as she was told what was happening.  Patient tolerated treatment well.   TX duration: 2 hours and 35 min  Alert, without acute distress.  Total UF removed: 1100 ml  Hand-off given to patient's nurse.   Transported back to the room   Jennifer [name] L. Lenon, RN Kid[facility] D[facility] Unit.

## 2024-04-12 NOTE — Progress Notes (Signed)
 Pt not stable for d/c today per attending. Contacted pt's home HD RN at [facility] to be advised that pt may d/c tomorrow if deemed stable by medical team. Pt will resume home HD at d/c. Will assist as needed.   Randine Mungo Dialysis Navigator [phone]

## 2024-04-12 NOTE — Progress Notes (Signed)
 [facility] Progress Note   Subjective:   Pt seen in room, eating breakfast. Husband is at bedside. He feels she is back to her baseline and is comfortable with resuming home HD when she discharges. His daughter does her dialysis.  Objective Vitals:   04/11/24 2200 04/12/24 0000 04/12/24 0448 04/12/24 0747  BP:      Pulse: 84   76  Resp:    15  Temp: 98.2 F (36.8 C) 98.2 F (36.8 C) 98.1 F (36.7 C) 97.8 F (36.6 C)  TempSrc: Oral  Oral Oral  SpO2: 94% 93% 94% 93%  Weight:      Height:       Physical Exam General: Alert female in NAD Heart: RRR, no murmurs, rubs or gallops Lungs: CTA bilaterally, respirations unlabored Abdomen: Soft, non-distended, +BS Extremities: No edema b/l lower extremities Dialysis Access:  AVF + t/b  Additional Objective Labs: Basic Metabolic Panel: Recent Labs  Lab 04/07/24 0327 04/09/24 0228 04/10/24 0248  NA 137 134* 130*  K 3.0* 3.6 4.0  CL 96* 97* 92*  CO2 28 26 26   GLUCOSE 74 131* 119*  BUN 24* 16 26*  CREATININE 5.42* 4.52* 5.46*  CALCIUM  8.5* 8.1* 8.4*  PHOS 2.6  --   --    Liver Function Tests: Recent Labs  Lab 04/07/24 0327 04/09/24 0228 04/10/24 0248  AST 13* 14* 17  ALT 8 8 7   ALKPHOS 70 72 64  BILITOT 0.8 0.4 0.4  PROT 5.1* 5.6* 5.3*  ALBUMIN  2.9* 3.0* 2.8*   No results for input(s): LIPASE, AMYLASE in the last 168 hours. CBC: Recent Labs  Lab 04/06/24 1251 04/07/24 0327 04/09/24 0228 04/10/24 0248  WBC 6.5 4.2 8.9 6.5  NEUTROABS 5.2  --   --   --   HGB 9.8* 8.7* 9.0* 9.1*  HCT 31.0* 27.4* 28.4* 29.4*  MCV 92.0 89.8 90.2 92.2  PLT 249 229 249 223   Blood Culture    Component Value Date/Time   SDES URINE, CLEAN CATCH 04/06/2024 1830   SPECREQUEST  04/06/2024 1830    NONE Performed at [facility], [address] [place], [place] [address]    CULT (A) 04/06/2024 1830    >=100,000 COLONIES/mL KLEBSIELLA PNEUMONIAE 50,000 COLONIES/mL ESCHERICHIA COLI    REPTSTATUS  04/10/2024 FINAL 04/06/2024 1830    Cardiac Enzymes: No results for input(s): CKTOTAL, CKMB, CKMBINDEX, TROPONINI in the last 168 hours. CBG: Recent Labs  Lab 04/11/24 0847 04/11/24 1110 04/11/24 1647 04/11/24 1956 04/12/24 0750  GLUCAP 121* 179* 156* 210* 151*   Iron Studies: No results for input(s): IRON, TIBC, TRANSFERRIN, FERRITIN in the last 72 hours. @lablastinr3 @ Studies/Results: DG Abd 1 View Result Date: 04/10/2024 CLINICAL DATA:  Constipation. EXAM: ABDOMEN - 1 VIEW COMPARISON:  CT 04/06/2024 FINDINGS: Small to moderate colonic stool burden. No small bowel distension or evidence of obstruction. Cholecystectomy clips in the right upper quadrant. No radiopaque calculi. Degenerative change in the spine and hips. IMPRESSION: Small to moderate colonic stool burden. No bowel obstruction. Electronically Signed   By: Andrea Gasman M.D.   On: 04/10/2024 15:06   Medications:  sodium chloride  irrigation      amLODipine   5 mg Oral Daily   atorvastatin   80 mg Oral QPM   carvedilol   6.25 mg Oral BID WC   Chlorhexidine  Gluconate Cloth  6 each Topical Q0600   darbepoetin (ARANESP ) injection - DIALYSIS  150 mcg Subcutaneous Q Tue-1800   DULoxetine   60 mg Oral  Daily   feeding supplement  237 mL Oral BID BM   insulin  aspart  0-6 Units Subcutaneous TID WC   levETIRAcetam  500 mg Oral Daily   levETIRAcetam  500 mg Oral Q M,W,F-1800   levothyroxine   175 mcg Oral QAC breakfast   memantine   10 mg Oral BID    Dialysis Orders:  HHD, home clinic: [facility] ([facility]). BFR 400, DFR 16.4L/hr. EDW 86.5. 2k, 45 lactate. AVF 15g (buttonholes). No heparin . Meds: venofer 100mg , last mircera 225mcg on 9/24   Assessment/Plan: 1. Encephalopathy: partial complex seizure, per neuro.  2. ESRD: typically does HD on Tues, Wed, Sat, Sun. Keeping her on a MWF schedule here 3. HTN/volume:  BP elevated but imrpoved post HD. Continue home meds. Under EDW here, consider lowering EDW at  discharge. 4. Anemia:  Hgb 9.1, hemorrhagic cystitis likely contributing. Tsat 39%. Aranesp  150mcg given 10/28. 5. Secondary hyperparathyroidism:  Calcium  and phos controlled. No binder 7. Hemorrhagic cystitis: Urology following, appears CBI is now off.  8. Hypokalemia: K+ low initially, improved with 4K bath with HD. Recent K+ at goal.  Lucie Collet, PA-C 04/12/2024, 10:12 AM  [facility] Pager: [phone]

## 2024-04-12 NOTE — Discharge Summary (Signed)
 Physician Discharge Summary   Patient: Jennifer [name] MRN: [id number] DOB: Oct 05, 1955  Admit date:     04/06/2024  Discharge date: 04/13/24  Discharge Physician: Garnette Pelt   PCP: Randeen Laine LABOR, MD   Recommendations at discharge:   Follow up with PCP in 1-2 weeks Follow up with routine HD Follow up with Dr. Lovie  a will be scheduled for cystoscopy as outpt   Discharge Diagnoses: Principal Problem:   Seizure [facility])  Resolved Problems:   * No resolved hospital problems. *  Hospital Course: 68 y.o. 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  Given response to Ativan , latter seems to be more plausible - Neurology consulted, appreciate input.  She was started on Keppra, continue - Continue seizure precautions, LTM EEG did not show any seizures - MRI of the brain negative for acute findings   Active problems Hemorrhagic cystitis - patient was diagnosed with a UTI, has completed Bactrim and currently is on Keflex  for persistent hematuria.  She has also been passing a significant amount of blood clots.  Urology consulted, underwent CBI, hematuria clearing. - Urine cultures with Klebsiella resistant to ampicillin, cefazolin , nitrofurantoin, Bactrim, Unasyn and Zosyn.  Continue ceftriaxone  for now -Seen with Urology, ruling out constipation.  -Large BM noted 10/29.  -Foley was discontinued -Resumed eliquis  without further bleed -pt to f/u as outpatient for cystoscopy with Urology   ESRD -nephrology  consulted, appreciate input.  Cont HD   History of PE-Eliquis  is on hold due to persistent hematuria. Discussed with Urology. Tolerated resuming eliquis  in hospital w/o issues. Resume eliquis  on d/c   Vascular dementia -she has good days and bad days per patient's daughters.  Resume home meds   Essential hypertension -continue home medications, pressure stabilized   Hypothyroidism-resume Synthroid    Depression-continue home medications except Wellbutrin  which will be held per neurology   Hyperlipidemia-resume statin   Right shin wound-monitor, does not appear infected   Abd pain-stool noted on xray without obstruction. Large BM noted 10/29 per RN    Consultants: Nephrology, Urology Procedures performed:   Disposition: Home Diet recommendation:  Dysphagia type 2 thin Liquid DISCHARGE MEDICATION: Allergies as of 04/13/2024       Reactions   Tnkase  [tenecteplase ] Other (See Comments)   Brain bleed   Glucophage [metformin] Diarrhea        Medication List     STOP taking these medications    aspirin  81 MG chewable tablet   buPROPion  150 MG 12 hr tablet Commonly known as: WELLBUTRIN  SR   cephALEXin  500 MG capsule Commonly known as: KEFLEX    HYDROcodone -acetaminophen  5-325 MG tablet Commonly known as: NORCO/VICODIN   Potassium Chloride  ER 20 MEQ Tbcr   sevelamer  carbonate 0.8 g Pack packet Commonly known as: RENVELA    sulfamethoxazole-trimethoprim 800-160 MG tablet Commonly known as: BACTRIM DS       TAKE these medications    acetaminophen  500 MG tablet Commonly known as: TYLENOL  Take 1,000 mg by mouth every 6 (six) hours as needed for mild pain (pain score 1-3).   amLODipine   5 MG tablet Commonly known as: NORVASC  Take 1 tablet by mouth daily.   apixaban  5 MG Tabs tablet Commonly known as: ELIQUIS  Take 1 tablet (5 mg total) by mouth 2 (two) times daily.   atorvastatin  80 MG tablet Commonly known as: LIPITOR  TAKE 1 TABLET BY MOUTH ONCE DAILY AT  6   IN  THE  EVENING What changed:  how much to take how to take this when to take this   carvedilol  6.25 MG tablet Commonly known as: COREG  Take 6.25 mg by mouth 2 (two) times daily with a meal.   DULoxetine  60 MG capsule Commonly known as: CYMBALTA  Take 1 capsule (60 mg total) by mouth daily.   iron sucrose 100 mg in sodium chloride  0.9 % 100 mL Inject 100 mg into the vein every dialysis (low iron).   levETIRAcetam 500 MG tablet Commonly known as: KEPPRA Take 1 tablet (500 mg total) by mouth daily AND 1 tablet (500 mg total) every Monday, Wednesday, and Friday at 6 PM.   levothyroxine  175 MCG tablet Commonly known as: SYNTHROID  Take 175 mcg by mouth daily before breakfast.   lidocaine -prilocaine  cream Commonly known as: EMLA  Apply 1 Application topically daily as needed (HD).   memantine  10 MG tablet Commonly known as: NAMENDA  Take 1 tablet (10 mg total) by mouth 2 (two) times daily.   Mircera 200 MCG/0.3ML Sosy Generic drug: Methoxy PEG-Epoetin Beta 200 mcg by Implant route daily as needed (low RBC at dialysis). Mircera   nitroGLYCERIN  0.4 MG SL tablet Commonly known as: NITROSTAT  Place 0.4 mg under the tongue every 5 (five) minutes x 3 doses as needed for chest pain.   OVER THE COUNTER MEDICATION Apply 1 application  topically See admin instructions. Zinc barrier cream- apply to buttocks with each incontinence episode.        Follow-up Information     Lovie Arlyss CROME, MD Follow up.   Specialty: Urology Why: as will be scheduled Contact information: [address] [place] [address] [phone]         Randeen Laine LABOR, MD Follow up in 2 week(s).   Specialties: Family Medicine, Radiology Why: Hospital follow up Contact information: [address] [place] [address] [phone]         Continue with scheduled HD Follow up.                 Discharge Exam: Filed Weights   04/10/24 1415 04/10/24 1800 04/12/24 1308   Weight: 83.1 kg 81.1 kg 79.7 kg   General exam: Awake, laying in bed, in nad Respiratory system: Normal respiratory effort, no wheezing Cardiovascular system: regular rate, s1, s2 Gastrointestinal system: Soft, nondistended, positive BS Central nervous system: CN2-12 grossly intact, strength intact Extremities: Perfused, no clubbing Skin: Normal skin turgor, no notable skin lesions seen Psychiatry: Mood normal // no visual hallucinations   Condition at discharge: fair  The results of significant diagnostics from this hospitalization (including imaging, microbiology, ancillary and laboratory) are listed below for reference.   Imaging Studies: DG Abd 1 View Result Date: 04/10/2024 CLINICAL DATA:  Constipation. EXAM: ABDOMEN - 1 VIEW COMPARISON:  CT 04/06/2024 FINDINGS: Small to moderate colonic stool burden. No small bowel distension or evidence of obstruction. Cholecystectomy clips in the right upper quadrant. No radiopaque calculi. Degenerative change in the spine and hips. IMPRESSION: Small to moderate colonic stool burden. No bowel obstruction. Electronically Signed   By: Andrea Gasman M.D.   On: 04/10/2024 15:06  Overnight EEG with video Result Date: 04/08/2024 Shelton Arlin KIDD, MD     04/09/2024 10:41 AM Patient Name: Jennifer [name] MRN: [id number] Epilepsy Attending: Arlin KIDD Shelton Referring Physician/Provider: Judithe Rocky BROCKS, NP Duration: 04/07/2024 1138 to 04/08/2024 1338 Patient history: 67yo F with right gaze deviation and decreased responsiveness. EEG to evaluate for seizure Level of alertness: Awake, asleep AEDs during EEG study: LEV Technical aspects: This EEG study was done with scalp electrodes positioned according to the 10-20 International system of electrode placement. Electrical activity was reviewed with band pass filter of 1-70Hz , sensitivity of 7 uV/mm, display speed of 80mm/sec with a 60Hz  notched filter applied as appropriate. EEG data were recorded continuously  and digitally stored.  Video monitoring was available and reviewed as appropriate. Description: EEG showed continuous generalized 3 to 6 Hz theta-delta slowing. Hyperventilation and photic stimulation were not performed.   ABNORMALITY - Continuous slow, generalized IMPRESSION: This study is suggestive of generalized non specific cerebral dysfunction (encephalopathy). No seizures or epileptiform discharges were seen throughout the recording. Arlin KIDD Shelton   MR BRAIN WO CONTRAST Result Date: 04/07/2024 EXAM: MRI BRAIN WITHOUT CONTRAST 04/07/2024 11:36:24 AM TECHNIQUE: Multiplanar multisequence MRI of the head/brain was performed without the administration of intravenous contrast. COMPARISON: Head CT yesterday. Brain MRI 11/27/2023 and earlier. CLINICAL HISTORY: 68 year old female with new-onset seizure and code stroke presentation yesterday. FINDINGS: BRAIN AND VENTRICLES: No acute infarct. No intracranial hemorrhage. No mass. No midline shift. No hydrocephalus. The sella is unremarkable. Normal flow voids. Loss of the distal right vertebral artery flow void since the June MRI (series 10 image 4). However, this vessel flow void is maintained on coronal T2 images, such that the axial appearance is most likely artifact. Chronic severe small vessel disease. Extensive chronic white matter T2 and FLAIR hyperintensity. Chronic lacunar infarcts throughout the bilateral deep gray nuclei. Signal abnormality throughout the pons plus chronic lacunar infarcts. Chronic cerebellar infarcts greater on the right. Numerous chronic microhemorrhages which are concentrated in the mesial temporal lobes, brainstem, and deep cerebellar nuclei. Trace chronic right lateral ventricle atrium hemosiderin is stable. No new signal abnormality is identified. ORBITS: No acute abnormality. SINUSES AND MASTOIDS: Mild bilateral mastoid effusions have increased, but the visible nasopharynx is negative. BONES AND SOFT TISSUES: Normal marrow  signal. No acute soft tissue abnormality. IMPRESSION: 1. No acute intracranial abnormality. 2. Chronic severe small vessel disease stable since June. Electronically signed by: Helayne Hurst MD 04/07/2024 11:48 AM EDT RP Workstation: [id number]   CT HEMATURIA WORKUP Result Date: 04/06/2024 EXAM: CT UROGRAM 04/06/2024 11:51:28 PM TECHNIQUE: CT of the abdomen and pelvis was performed without and with the administration of 100 mL of iohexol  (OMNIPAQUE ) 350 MG/ML injection as per CT urogram protocol. Multiplanar reformatted images as well as MIP urogram images are provided for review. Automated exposure control, iterative reconstruction, and/or weight based adjustment of the mA/kV was utilized to reduce the radiation dose to as low as reasonably achievable. COMPARISON: CT abdomen/pelvis dated 04/03/2024. CLINICAL HISTORY: FINDINGS: LOWER CHEST: Mild linear scarring/atelectasis at the left lung base. LIVER: The liver is unremarkable. GALLBLADDER AND BILE DUCTS: Status post cholecystectomy. No biliary ductal dilatation. SPLEEN: No acute abnormality. PANCREAS: No acute abnormality. ADRENAL GLANDS: No acute abnormality. KIDNEYS, URETERS AND BLADDER: Subcentimeter left renal cyst (image 21), benign. Per consensus, no follow-up is needed for simple Bosniak type 1 and 2 renal cysts, unless the patient has a malignancy history or risk factors. No stones in the kidneys or ureters. No hydronephrosis. No perinephric or  periureteral stranding. Mildly thick-walled bladder, correlate for cystitis. Moderate layering hemorrhage in the posterior bladder, mildly improved. No focal bladder mass on CT. GI AND BOWEL: Stomach demonstrates no acute abnormality. Normal appendix (image 63). Sigmoid diverticulosis, without evidence of diverticulitis. There is no bowel obstruction. PERITONEUM AND RETROPERITONEUM: No ascites. No free air. VASCULATURE: Aorta is normal in caliber. Atherosclerotic calcifications of the abdominal aorta and branch  vessels, although patent. LYMPH NODES: No lymphadenopathy. REPRODUCTIVE ORGANS: No acute abnormality. BONES AND SOFT TISSUES: Mild degenerative changes of the visualized THORACOLUMBAR spine. No focal soft tissue abnormality. IMPRESSION: 1. Moderate layering hemorrhage in the posterior bladder, mildly improved. 2. Mild bladder wall thickening, correlate for cystitis 3. No focal bladder mass on CT. Regardless, consider cystoscopy for further evaluation. Electronically signed by: Pinkie Pebbles MD 04/06/2024 11:59 PM EDT RP Workstation: [id number]   CT HEAD CODE STROKE WO CONTRAST (LKW 0-4.5h, LVO 0-24h) Result Date: 04/06/2024 EXAM: CT HEAD WITHOUT 04/06/2024 12:33:24 PM TECHNIQUE: CT of the head was performed without the administration of intravenous contrast. Automated exposure control, iterative reconstruction, and/or weight based adjustment of the mA/kV was utilized to reduce the radiation dose to as low as reasonably achievable. COMPARISON: CT head without contrast 01/24/2024. MR head without contrast 11/27/2023. CLINICAL HISTORY: FINDINGS: BRAIN AND VENTRICLES: No acute intracranial hemorrhage. No mass effect or midline shift. No extra-axial fluid collection. No evidence of acute infarct. Remote infarcts again noted in the cerebellum bilaterally. Remote lacunar infarcts are present in the right basal ganglia and corona radiata without significant interval change. Remote lacunar infarcts are stable in both thalami. Periventricular and subcortical white matter hypoattenuation is moderately advanced for age, stable. Atherosclerotic calcifications are present in the cavernous carotid arteries bilaterally. No hyperdense vessel is present. No hydrocephalus. ORBITS: Bilateral lens replacements are noted. The globes and orbits are otherwise within normal limits. SINUSES AND MASTOIDS: No acute abnormality. SOFT TISSUES AND SKULL: No acute skull fracture. No acute soft tissue abnormality. IMPRESSION: 1. No acute  intracranial abnormality. 2. Stable remote infarcts in the cerebellum bilaterally, right basal ganglia, and corona radiata. 3. Stable lacunar infarcts in both thalami. 4. Moderately advanced periventricular and subcortical white matter hypoattenuation, stable. 5. Atherosclerotic calcifications in the cavernous carotid arteries bilaterally. No hyperdense vessel present. Electronically signed by: Lonni Necessary MD 04/06/2024 12:39 PM EDT RP Workstation: [id number]   CT ABDOMEN PELVIS WO CONTRAST Result Date: 04/03/2024 CLINICAL DATA:  Lower abdominal pain EXAM: CT ABDOMEN AND PELVIS WITHOUT CONTRAST TECHNIQUE: Multidetector CT imaging of the abdomen and pelvis was performed following the standard protocol without IV contrast. RADIATION DOSE REDUCTION: This exam was performed according to the departmental dose-optimization program which includes automated exposure control, adjustment of the mA and/or kV according to patient size and/or use of iterative reconstruction technique. COMPARISON:  CT 03/22/2022 FINDINGS: Lower chest: Lung bases demonstrate scarring at the left base. Cardiomegaly with small pericardial effusion. Coronary vascular calcification. Hepatobiliary: No focal liver abnormality is seen. Status post cholecystectomy. No biliary dilatation. Pancreas: Unremarkable. No pancreatic ductal dilatation or surrounding inflammatory changes. Spleen: Normal in size without focal abnormality. Adrenals/Urinary Tract: Adrenal glands are normal. Kidneys show no hydronephrosis. Lobulated hyperdensity within the bladder posteriorly, this measures 10.3 x 4.9 cm. Stomach/Bowel: Stomach within normal limits. Few fluid-filled nondilated small bowel loops. No acute bowel wall thickening. Negative appendix. Diverticular disease of the colon. Vascular/Lymphatic: Aortic atherosclerosis. No enlarged abdominal or pelvic lymph nodes. Reproductive: Status post hysterectomy. No adnexal masses. Other: Negative for pelvic  effusion or free air Musculoskeletal: Degenerative  changes. No acute or suspicious osseous abnormality IMPRESSION: 1. Lobulated hyperdensity within the bladder posteriorly, this measures 10.3 x 4.9 cm and either represents hyperdense mass versus hematoma/hemorrhagic material. Recommend correlation with urinalysis and cystoscopy as indicated. 2. Diverticular disease of the colon without acute inflammatory process. 3. Cardiomegaly with small pericardial effusion. 4. Aortic atherosclerosis. Aortic Atherosclerosis (ICD10-I70.0). Electronically Signed   By: Luke Bun M.D.   On: 04/03/2024 19:11   DG Chest 1 View Result Date: 04/03/2024 CLINICAL DATA:  Shortness of breath EXAM: CHEST  1 VIEW COMPARISON:  01/24/2024, 12/07/2023 FINDINGS: Normal cardiac size with aortic atherosclerosis. Mild bronchitic changes without focal opacity. No pleural effusion. No pneumothorax. IMPRESSION: Mild bronchitic changes without focal airspace disease. Electronically Signed   By: Luke Bun M.D.   On: 04/03/2024 17:54    Microbiology: Results for orders placed or performed during the hospital encounter of 04/06/24  Urine Culture (for pregnant, neutropenic or urologic patients or patients with an indwelling urinary catheter)     Status: Abnormal   Collection Time: 04/06/24  6:30 PM   Specimen: Urine, Clean Catch  Result Value Ref Range Status   Specimen Description URINE, CLEAN CATCH  Final   Special Requests   Final    NONE Performed at [facility], [address] [place], [place] [address]    Culture (A)  Final    >=100,000 COLONIES/mL KLEBSIELLA PNEUMONIAE 50,000 COLONIES/mL ESCHERICHIA COLI    Report Status 04/10/2024 FINAL  Final   Organism ID, Bacteria KLEBSIELLA PNEUMONIAE (A)  Final   Organism ID, Bacteria ESCHERICHIA COLI (A)  Final      Susceptibility   Escherichia coli - MIC*    AMPICILLIN >=32 RESISTANT Resistant     CEFAZOLIN  (URINE) Value in next row Sensitive      4 SENSITIVEThis is  a modified FDA-approved test that has been validated and its performance characteristics determined by the reporting laboratory.  This laboratory is certified under the Clinical Laboratory Improvement Amendments CLIA as qualified to perform high complexity clinical laboratory testing.    CEFEPIME  Value in next row Sensitive      4 SENSITIVEThis is a modified FDA-approved test that has been validated and its performance characteristics determined by the reporting laboratory.  This laboratory is certified under the Clinical Laboratory Improvement Amendments CLIA as qualified to perform high complexity clinical laboratory testing.    ERTAPENEM Value in next row Sensitive      4 SENSITIVEThis is a modified FDA-approved test that has been validated and its performance characteristics determined by the reporting laboratory.  This laboratory is certified under the Clinical Laboratory Improvement Amendments CLIA as qualified to perform high complexity clinical laboratory testing.    CEFTRIAXONE  Value in next row Sensitive      4 SENSITIVEThis is a modified FDA-approved test that has been validated and its performance characteristics determined by the reporting laboratory.  This laboratory is certified under the Clinical Laboratory Improvement Amendments CLIA as qualified to perform high complexity clinical laboratory testing.    CIPROFLOXACIN Value in next row Sensitive      4 SENSITIVEThis is a modified FDA-approved test that has been validated and its performance characteristics determined by the reporting laboratory.  This laboratory is certified under the Clinical Laboratory Improvement Amendments CLIA as qualified to perform high complexity clinical laboratory testing.    GENTAMICIN Value in next row Resistant      4 SENSITIVEThis is a modified FDA-approved test that has been validated and its performance  characteristics determined by the reporting laboratory.  This laboratory is certified under the Clinical  Laboratory Improvement Amendments CLIA as qualified to perform high complexity clinical laboratory testing.    NITROFURANTOIN Value in next row Sensitive      4 SENSITIVEThis is a modified FDA-approved test that has been validated and its performance characteristics determined by the reporting laboratory.  This laboratory is certified under the Clinical Laboratory Improvement Amendments CLIA as qualified to perform high complexity clinical laboratory testing.    TRIMETH/SULFA Value in next row Resistant      4 SENSITIVEThis is a modified FDA-approved test that has been validated and its performance characteristics determined by the reporting laboratory.  This laboratory is certified under the Clinical Laboratory Improvement Amendments CLIA as qualified to perform high complexity clinical laboratory testing.    AMPICILLIN/SULBACTAM Value in next row Intermediate      4 SENSITIVEThis is a modified FDA-approved test that has been validated and its performance characteristics determined by the reporting laboratory.  This laboratory is certified under the Clinical Laboratory Improvement Amendments CLIA as qualified to perform high complexity clinical laboratory testing.    PIP/TAZO Value in next row Sensitive      <=4 SENSITIVEThis is a modified FDA-approved test that has been validated and its performance characteristics determined by the reporting laboratory.  This laboratory is certified under the Clinical Laboratory Improvement Amendments CLIA as qualified to perform high complexity clinical laboratory testing.    MEROPENEM Value in next row Sensitive      <=4 SENSITIVEThis is a modified FDA-approved test that has been validated and its performance characteristics determined by the reporting laboratory.  This laboratory is certified under the Clinical Laboratory Improvement Amendments CLIA as qualified to perform high complexity clinical laboratory testing.    * 50,000 COLONIES/mL ESCHERICHIA COLI    Klebsiella pneumoniae - MIC*    AMPICILLIN Value in next row Resistant      <=4 SENSITIVEThis is a modified FDA-approved test that has been validated and its performance characteristics determined by the reporting laboratory.  This laboratory is certified under the Clinical Laboratory Improvement Amendments CLIA as qualified to perform high complexity clinical laboratory testing.    CEFAZOLIN  (URINE) Value in next row Resistant      >=32 RESISTANTThis is a modified FDA-approved test that has been validated and its performance characteristics determined by the reporting laboratory.  This laboratory is certified under the Clinical Laboratory Improvement Amendments CLIA as qualified to perform high complexity clinical laboratory testing.    CEFEPIME  Value in next row Sensitive      >=32 RESISTANTThis is a modified FDA-approved test that has been validated and its performance characteristics determined by the reporting laboratory.  This laboratory is certified under the Clinical Laboratory Improvement Amendments CLIA as qualified to perform high complexity clinical laboratory testing.    ERTAPENEM Value in next row Sensitive      >=32 RESISTANTThis is a modified FDA-approved test that has been validated and its performance characteristics determined by the reporting laboratory.  This laboratory is certified under the Clinical Laboratory Improvement Amendments CLIA as qualified to perform high complexity clinical laboratory testing.    CEFTRIAXONE  Value in next row Sensitive      >=32 RESISTANTThis is a modified FDA-approved test that has been validated and its performance characteristics determined by the reporting laboratory.  This laboratory is certified under the Clinical Laboratory Improvement Amendments CLIA as qualified to perform high complexity clinical laboratory testing.    CIPROFLOXACIN Value in  next row Sensitive      >=32 RESISTANTThis is a modified FDA-approved test that has been validated and  its performance characteristics determined by the reporting laboratory.  This laboratory is certified under the Clinical Laboratory Improvement Amendments CLIA as qualified to perform high complexity clinical laboratory testing.    GENTAMICIN Value in next row Sensitive      >=32 RESISTANTThis is a modified FDA-approved test that has been validated and its performance characteristics determined by the reporting laboratory.  This laboratory is certified under the Clinical Laboratory Improvement Amendments CLIA as qualified to perform high complexity clinical laboratory testing.    NITROFURANTOIN Value in next row Intermediate      >=32 RESISTANTThis is a modified FDA-approved test that has been validated and its performance characteristics determined by the reporting laboratory.  This laboratory is certified under the Clinical Laboratory Improvement Amendments CLIA as qualified to perform high complexity clinical laboratory testing.    TRIMETH/SULFA Value in next row Resistant      >=32 RESISTANTThis is a modified FDA-approved test that has been validated and its performance characteristics determined by the reporting laboratory.  This laboratory is certified under the Clinical Laboratory Improvement Amendments CLIA as qualified to perform high complexity clinical laboratory testing.    AMPICILLIN/SULBACTAM Value in next row Resistant      >=32 RESISTANTThis is a modified FDA-approved test that has been validated and its performance characteristics determined by the reporting laboratory.  This laboratory is certified under the Clinical Laboratory Improvement Amendments CLIA as qualified to perform high complexity clinical laboratory testing.    PIP/TAZO Value in next row Resistant      >=128 RESISTANTThis is a modified FDA-approved test that has been validated and its performance characteristics determined by the reporting laboratory.  This laboratory is certified under the Clinical Laboratory Improvement  Amendments CLIA as qualified to perform high complexity clinical laboratory testing.    MEROPENEM Value in next row Sensitive      >=128 RESISTANTThis is a modified FDA-approved test that has been validated and its performance characteristics determined by the reporting laboratory.  This laboratory is certified under the Clinical Laboratory Improvement Amendments CLIA as qualified to perform high complexity clinical laboratory testing.    * >=100,000 COLONIES/mL KLEBSIELLA PNEUMONIAE    Labs: CBC: Recent Labs  Lab 04/07/24 0327 04/09/24 0228 04/10/24 0248 04/13/24 0440  WBC 4.2 8.9 6.5 6.7  HGB 8.7* 9.0* 9.1* 9.4*  HCT 27.4* 28.4* 29.4* 30.5*  MCV 89.8 90.2 92.2 91.9  PLT 229 249 223 229   Basic Metabolic Panel: Recent Labs  Lab 04/07/24 0327 04/09/24 0228 04/10/24 0248  NA 137 134* 130*  K 3.0* 3.6 4.0  CL 96* 97* 92*  CO2 28 26 26   GLUCOSE 74 131* 119*  BUN 24* 16 26*  CREATININE 5.42* 4.52* 5.46*  CALCIUM  8.5* 8.1* 8.4*  MG 1.8 1.8 1.8  PHOS 2.6  --   --    Liver Function Tests: Recent Labs  Lab 04/07/24 0327 04/09/24 0228 04/10/24 0248  AST 13* 14* 17  ALT 8 8 7   ALKPHOS 70 72 64  BILITOT 0.8 0.4 0.4  PROT 5.1* 5.6* 5.3*  ALBUMIN  2.9* 3.0* 2.8*   CBG: Recent Labs  Lab 04/12/24 1107 04/12/24 1741 04/12/24 2113 04/13/24 0608 04/13/24 1128  GLUCAP 188* 124* 290* 153* 183*    Discharge time spent: less than 30 minutes.  Signed: Garnette Pelt, MD [facility] 04/13/2024

## 2024-04-13 ENCOUNTER — Other Ambulatory Visit (HOSPITAL_COMMUNITY): Payer: Self-pay

## 2024-04-13 LAB — CBC
HCT: 30.5 % — ABNORMAL LOW (ref 36.0–46.0)
Hemoglobin: 9.4 g/dL — ABNORMAL LOW (ref 12.0–15.0)
MCH: 28.3 pg (ref 26.0–34.0)
MCHC: 30.8 g/dL (ref 30.0–36.0)
MCV: 91.9 fL (ref 80.0–100.0)
Platelets: 229 K/uL (ref 150–400)
RBC: 3.32 MIL/uL — ABNORMAL LOW (ref 3.87–5.11)
RDW: 15.8 % — ABNORMAL HIGH (ref 11.5–15.5)
WBC: 6.7 K/uL (ref 4.0–10.5)
nRBC: 0 % (ref 0.0–0.2)

## 2024-04-13 LAB — GLUCOSE, CAPILLARY
Glucose-Capillary: 153 mg/dL — ABNORMAL HIGH (ref 70–99)
Glucose-Capillary: 183 mg/dL — ABNORMAL HIGH (ref 70–99)

## 2024-04-13 MED ORDER — LEVETIRACETAM 500 MG PO TABS
ORAL_TABLET | ORAL | 0 refills | Status: DC
  Filled 2024-04-13: qty 42, 30d supply, fill #0

## 2024-04-13 NOTE — Plan of Care (Signed)
  Problem: Education: Goal: Knowledge of General Education information will improve Description: Including pain rating scale, medication(s)/side effects and non-pharmacologic comfort measures 04/13/2024 1432 by Berkeley Verdie BRAVO, RN Outcome: Adequate for Discharge 04/13/2024 0756 by Berkeley Verdie BRAVO, RN Outcome: Progressing   Problem: Health Behavior/Discharge Planning: Goal: Ability to manage health-related needs will improve Outcome: Adequate for Discharge   Problem: Clinical Measurements: Goal: Ability to maintain clinical measurements within normal limits will improve Outcome: Adequate for Discharge Goal: Will remain free from infection Outcome: Adequate for Discharge Goal: Diagnostic test results will improve Outcome: Adequate for Discharge Goal: Respiratory complications will improve Outcome: Adequate for Discharge Goal: Cardiovascular complication will be avoided Outcome: Adequate for Discharge   Problem: Activity: Goal: Risk for activity intolerance will decrease Outcome: Adequate for Discharge   Problem: Nutrition: Goal: Adequate nutrition will be maintained 04/13/2024 1432 by Berkeley Verdie BRAVO, RN Outcome: Adequate for Discharge 04/13/2024 0756 by Berkeley Verdie BRAVO, RN Outcome: Progressing   Problem: Coping: Goal: Level of anxiety will decrease Outcome: Adequate for Discharge   Problem: Elimination: Goal: Will not experience complications related to bowel motility Outcome: Adequate for Discharge Goal: Will not experience complications related to urinary retention 04/13/2024 1432 by Berkeley Verdie BRAVO, RN Outcome: Adequate for Discharge 04/13/2024 0756 by Berkeley Verdie BRAVO, RN Outcome: Progressing   Problem: Pain Managment: Goal: General experience of comfort will improve and/or be controlled Outcome: Adequate for Discharge   Problem: Safety: Goal: Ability to remain free from injury will improve 04/13/2024 1432 by Berkeley Verdie BRAVO, RN Outcome: Adequate for  Discharge 04/13/2024 0756 by Berkeley Verdie BRAVO, RN Outcome: Progressing   Problem: Skin Integrity: Goal: Risk for impaired skin integrity will decrease Outcome: Adequate for Discharge   Problem: Education: Goal: Ability to describe self-care measures that may prevent or decrease complications (Diabetes Survival Skills Education) will improve Outcome: Adequate for Discharge Goal: Individualized Educational Video(s) Outcome: Adequate for Discharge   Problem: Coping: Goal: Ability to adjust to condition or change in health will improve Outcome: Adequate for Discharge   Problem: Fluid Volume: Goal: Ability to maintain a balanced intake and output will improve Outcome: Adequate for Discharge   Problem: Health Behavior/Discharge Planning: Goal: Ability to identify and utilize available resources and services will improve Outcome: Adequate for Discharge Goal: Ability to manage health-related needs will improve Outcome: Adequate for Discharge   Problem: Metabolic: Goal: Ability to maintain appropriate glucose levels will improve Outcome: Adequate for Discharge   Problem: Nutritional: Goal: Maintenance of adequate nutrition will improve Outcome: Adequate for Discharge Goal: Progress toward achieving an optimal weight will improve Outcome: Adequate for Discharge   Problem: Skin Integrity: Goal: Risk for impaired skin integrity will decrease Outcome: Adequate for Discharge   Problem: Tissue Perfusion: Goal: Adequacy of tissue perfusion will improve Outcome: Adequate for Discharge   Problem: Safety: Goal: Non-violent Restraint(s) Outcome: Adequate for Discharge

## 2024-04-13 NOTE — Progress Notes (Signed)
 [facility] Progress Note   Subjective:   Pt seen in room, alert, reports she is hoping to go home today. Denies SOB, CP, dizziness, nausea.   Objective Vitals:   04/12/24 2320 04/13/24 0406 04/13/24 0822 04/13/24 0932  BP: 124/60 (!) 129/49 (!) 124/54 (!) 135/53  Pulse: 86 83 81   Resp: 14 14 15    Temp: 99.3 F (37.4 C) 98.2 F (36.8 C) 98 F (36.7 C)   TempSrc: Oral Oral Oral   SpO2: 97% 100% 100%   Weight:      Height:       Physical Exam General: Alert female in NAD Heart: RRR, no murmurs, rubs or gallops Lungs: CTA bilaterally, respirations unlabored Abdomen: Soft, non-distended, +BS Extremities: No edema b/l lower extremities Dialysis Access:  AVF + t/b  Additional Objective Labs: Basic Metabolic Panel: Recent Labs  Lab 04/07/24 0327 04/09/24 0228 04/10/24 0248  NA 137 134* 130*  K 3.0* 3.6 4.0  CL 96* 97* 92*  CO2 28 26 26   GLUCOSE 74 131* 119*  BUN 24* 16 26*  CREATININE 5.42* 4.52* 5.46*  CALCIUM  8.5* 8.1* 8.4*  PHOS 2.6  --   --    Liver Function Tests: Recent Labs  Lab 04/07/24 0327 04/09/24 0228 04/10/24 0248  AST 13* 14* 17  ALT 8 8 7   ALKPHOS 70 72 64  BILITOT 0.8 0.4 0.4  PROT 5.1* 5.6* 5.3*  ALBUMIN  2.9* 3.0* 2.8*   No results for input(s): LIPASE, AMYLASE in the last 168 hours. CBC: Recent Labs  Lab 04/06/24 1251 04/07/24 0327 04/09/24 0228 04/10/24 0248 04/13/24 0440  WBC 6.5 4.2 8.9 6.5 6.7  NEUTROABS 5.2  --   --   --   --   HGB 9.8* 8.7* 9.0* 9.1* 9.4*  HCT 31.0* 27.4* 28.4* 29.4* 30.5*  MCV 92.0 89.8 90.2 92.2 91.9  PLT 249 229 249 223 229   Blood Culture    Component Value Date/Time   SDES URINE, CLEAN CATCH 04/06/2024 1830   SPECREQUEST  04/06/2024 1830    NONE Performed at [facility], [address], [place], [place] [address]    CULT (A) 04/06/2024 1830    >=100,000 COLONIES/mL KLEBSIELLA PNEUMONIAE 50,000 COLONIES/mL ESCHERICHIA COLI    REPTSTATUS 04/10/2024 FINAL 04/06/2024  1830    Cardiac Enzymes: No results for input(s): CKTOTAL, CKMB, CKMBINDEX, TROPONINI in the last 168 hours. CBG: Recent Labs  Lab 04/12/24 0750 04/12/24 1107 04/12/24 1741 04/12/24 2113 04/13/24 0608  GLUCAP 151* 188* 124* 290* 153*   Iron Studies: No results for input(s): IRON, TIBC, TRANSFERRIN, FERRITIN in the last 72 hours. @lablastinr3 @ Studies/Results: No results found. Medications:  sodium chloride  irrigation      amLODipine   5 mg Oral Daily   apixaban   5 mg Oral BID   atorvastatin   80 mg Oral QPM   carvedilol   6.25 mg Oral BID WC   Chlorhexidine  Gluconate Cloth  6 each Topical Q0600   darbepoetin (ARANESP ) injection - DIALYSIS  150 mcg Subcutaneous Q Tue-1800   DULoxetine   60 mg Oral Daily   feeding supplement  237 mL Oral BID BM   insulin  aspart  0-6 Units Subcutaneous TID WC   levETIRAcetam  500 mg Oral Daily   levETIRAcetam  500 mg Oral Q M,W,F-1800   levothyroxine   175 mcg Oral QAC breakfast   memantine   10 mg Oral BID    Dialysis Orders:  HHD, home clinic: [facility] ([facility]). BFR 400, DFR 16.4L/hr. EDW  86.5. 2k, 45 lactate. AVF 15g (buttonholes). No heparin . Meds: venofer 100mg , last mircera 225mcg on 9/24   Assessment/Plan: 1. Encephalopathy: partial complex seizure, per neuro.  2. ESRD: typically does HD on Tues, Wed, Sat, Sun. Keeping her on a MWF schedule here. Circuit started to clot yesterday, hopefully will resolve with eliquis , holding off on heparin  bolus for n ow.  3. HTN/volume:  BP elevated but imrpoved post HD. Continue home meds. Under EDW here, consider lowering EDW at discharge. 4. Anemia:  Hgb 9.4, hemorrhagic cystitis likely contributing. Tsat 39%. Aranesp  150mcg given 10/28. 5. Secondary hyperparathyroidism:  Calcium  and phos controlled. No binder 7. Hemorrhagic cystitis: Urology following, appears CBI is now off.  8. Hypokalemia: K+ low initially, improved with 4K bath with HD. Recent K+ at goal.  Lucie Collet,  PA-C 04/13/2024, 10:33 AM  [facility] Pager: [phone]

## 2024-04-13 NOTE — Plan of Care (Signed)
  Problem: Education: Goal: Knowledge of General Education information will improve Description: Including pain rating scale, medication(s)/side effects and non-pharmacologic comfort measures Outcome: Progressing   Problem: Health Behavior/Discharge Planning: Goal: Ability to manage health-related needs will improve Outcome: Progressing    Daugther is educated on plan

## 2024-04-15 ENCOUNTER — Telehealth: Payer: Self-pay

## 2024-04-15 NOTE — Transitions of Care (Post Inpatient/ED Visit) (Signed)
   04/15/2024  Name: Jennifer [name] MRN: [id number] DOB: 1955-08-18  Today's TOC FU Call Status: Today's TOC FU Call Status:: Unsuccessful Call (1st Attempt) Unsuccessful Call (1st Attempt) Date: 04/15/24  Attempted to reach the patient regarding the most recent Inpatient/ED visit.  Follow Up Plan: Additional outreach attempts will be made to reach the patient to complete the Transitions of Care (Post Inpatient/ED visit) call.   Arvin Seip RN, BSN, CCM [employer], Population Health Case Manager Phone: [phone]

## 2024-04-15 NOTE — Progress Notes (Signed)
 Late Note Entry- Apr 15, 2024  Contacted [facility] home HD RN to make aware of pt's d/c date and that pt should have resumed home HD upon d/c.   Randine Mungo Dialysis Navigator [phone]

## 2024-04-21 ENCOUNTER — Telehealth: Payer: Self-pay | Admitting: Family Medicine

## 2024-04-21 DIAGNOSIS — S81801S Unspecified open wound, right lower leg, sequela: Secondary | ICD-10-CM

## 2024-04-21 DIAGNOSIS — S81801A Unspecified open wound, right lower leg, initial encounter: Secondary | ICD-10-CM | POA: Insufficient documentation

## 2024-04-21 NOTE — Telephone Encounter (Signed)
 Wound care referral for wound on shin   In response to this message Duwaine Shona ORN, NP  Randeen Laine LABOR, MD Phone Number: [phone]   Dr. Randeen, I saw Jennifer [name] today and she seemed to be doing ok but still losing weight. She was 200# in July but now is 181#. The family is trying to encourage her to eat but she is not interested in food. I don't think she has any interest in anything and I'm not surprised with her poor QOL. The RLE wound is not decreasing in size and is about 8.5  x 6.5 cm with a very thick Escher top. Per hospital wound nurse suggestion, the daughter is cleansing with NS, then apply Vaseline gauze and covering. there seems to be a rolled edge to the wound forming. No drainage except blood, which is a good sign. I really think that she needs at least one visit to [facility] wound care center for a treatment plan and possible debridement. Daugther who is a engineer, civil (consulting) and has HH experience agrees. Can you refer without a telehealth visit and are you all still doing telehealth visits during this government shutdown. We aren't, not knowing if reimbursement will be back paid. Anyway, I told the daughter that I would reach out to you for a referral (if needed) to the wound care center as well as [facility] nurse. Just because she knows how, Medicare would pay for all the supplies if they were involved. I have recommended pro mod supplement to increase her protein, zinc and vitamin C for wound healing. Let me know if I can help out again in any way. I will be seeing her next month.  Shona Duwaine [facility] Palliative Care NP Director of Serious Illness Care [facility] Compassionate Care

## 2024-04-22 ENCOUNTER — Telehealth: Payer: Self-pay

## 2024-04-22 NOTE — Transitions of Care (Post Inpatient/ED Visit) (Signed)
   04/22/2024  Name: Jennifer [name] MRN: [id number] DOB: 1955/10/06  Today's TOC FU Call Status: Today's TOC FU Call Status:: Unsuccessful Call (2nd Attempt) Unsuccessful Call (2nd Attempt) Date: 04/22/24  Attempted to reach the patient regarding the most recent Inpatient/ED visit.  Follow Up Plan: Additional outreach attempts will be made to reach the patient to complete the Transitions of Care (Post Inpatient/ED visit) call.   Arvin Seip RN, BSN, CCM [employer], Population Health Case Manager Phone: [phone]

## 2024-04-23 ENCOUNTER — Telehealth: Payer: Self-pay | Admitting: *Deleted

## 2024-04-23 NOTE — Transitions of Care (Post Inpatient/ED Visit) (Signed)
 04/23/2024  Name: Jennifer [name] MRN: [id number] DOB: [birth year]  Today's TOC FU Call Status: Today's TOC FU Call Status:: Successful TOC FU Call Completed TOC FU Call Complete Date: 04/23/24  Patient's Name and Date of Birth confirmed. Name, DOB  Transition Care Management Follow-up Telephone Call Date of Discharge: 04/13/24 Discharge Facility[facility]) Type of Discharge: Inpatient Admission Primary Inpatient Discharge Diagnosis:: Seizure How have you been since you were released from the hospital?: Better Any questions or concerns?: No  Items Reviewed: Did you receive and understand the discharge instructions provided?: Yes Any new allergies since your discharge?: No Dietary orders reviewed?: No Do you have support at home?: Yes People in Home [RPT]: spouse Name of Support/Comfort Primary Source[place]  Medications Reviewed Today: Medications Reviewed Today     Reviewed by Kennieth Cathlean DEL, RN (Case Manager) on 04/23/24 at 1002  Med List Status: <None>   Medication Order Taking? Sig Documenting Provider Last Dose Status Informant  acetaminophen  (TYLENOL ) 500 MG tablet [id number] Yes Take 1,000 mg by mouth every 6 (six) hours as needed for mild pain (pain score 1-3). [provider]  Active Child, Pharmacy Records  amLODipine  (NORVASC ) 5 MG tablet [id number] Yes Take 1 tablet by mouth daily. [provider]  Active Child, Pharmacy Records  apixaban  (ELIQUIS ) 5 MG TABS tablet [id number]  Take 1 tablet (5 mg total) by mouth 2 (two) times daily.  Patient not taking: Reported on 04/23/2024   Randeen Laine LABOR, MD  Active Pharmacy Records, Child           Med Note Webster, DONETA GORMAN   Dju Apr 06, 2024  9:20 PM) Discontinued d/t continuous bleeding per Cardiologist- last dose Thursday night of 04/04/24    atorvastatin  (LIPITOR ) 80 MG tablet [id number] Yes TAKE 1 TABLET BY MOUTH ONCE DAILY AT  6  IN  THE  EVENING  Patient taking differently: Take 80  mg by mouth every evening. TAKE 1 TABLET BY MOUTH ONCE DAILY AT  6  IN  THE  EVENING   Mallipeddi, Vishnu P, MD  Active Pharmacy Records, Child  carvedilol  (COREG ) 6.25 MG tablet [id number] Yes Take 6.25 mg by mouth 2 (two) times daily with a meal. [provider]  Active Child, Pharmacy Records  DULoxetine  (CYMBALTA ) 60 MG capsule [id number] Yes Take 1 capsule (60 mg total) by mouth daily. [name], Laine LABOR, MD  Active Pharmacy Records, Child  iron sucrose 100 mg in sodium chloride  0.9 % 100 mL [id number] Yes Inject 100 mg into the vein every dialysis (low iron). [provider]  Active Pharmacy Records, Child           Med Note ISABEL, DONETA GORMAN   Sat Apr 06, 2024  9:23 PM) Getting prn per daughter  levETIRAcetam (KEPPRA) 500 MG tablet [id number] Yes Take 1 tablet (500 mg total) by mouth daily AND 1 tablet (500 mg total) every Monday, Wednesday, and Friday at 6 PM. Cindy Garnette POUR, MD  Active   levothyroxine  (SYNTHROID ) 175 MCG tablet [id number] Yes Take 175 mcg by mouth daily before breakfast. [provider]  Active Pharmacy Records, Child           Med Note (CRUTHIS, CHLOE C   Fri Dec 08, 2023  7:15 AM)    lidocaine -prilocaine  (EMLA ) cream [id number] Yes Apply 1 Application topically daily as needed (HD). [provider]  Active Pharmacy Records, Child  memantine  (NAMENDA ) 10 MG tablet [id number] Yes Take 1 tablet (  10 mg total) by mouth 2 (two) times daily. Whitfield Raisin, NP  Active Pharmacy Records, Child  Methoxy PEG-Epoetin Beta (MIRCERA) 200 MCG/0.3ML SOSY [id number] Yes 200 mcg by Implant route daily as needed (low RBC at dialysis). Mircera [provider]  Active Pharmacy Records, Child           Med Note (WARD, ANGELICA G   Wed Jan 24, 2024  8:04 PM)    nitroGLYCERIN  (NITROSTAT ) 0.4 MG SL tablet [id number]  Place 0.4 mg under the tongue every 5 (five) minutes x 3 doses as needed for chest pain.  Patient not taking: Reported on 04/23/2024   [provider]  Active Pharmacy Records, Child           Med Note ISABEL, DONETA RAMAN   Dju Apr 06, 2024  9:24 PM) Has never needed, but kept on hand  OVER THE COUNTER MEDICATION [id number] Yes Apply 1 application  topically See admin instructions. Zinc barrier cream- apply to buttocks with each incontinence episode. [provider]  Active Pharmacy Records, Child  Med List Note Isabel Doneta RAMAN, CPhT 04/06/24 2127): Dialysis on Tue, Thurs, Sat, Sun HD for at least 3 hours            Home Care and Equipment/Supplies: Were Home Health Services Ordered?: NA Any new equipment or medical supplies ordered?: NA  Functional Questionnaire: Do you need assistance with bathing/showering or dressing?: Yes Do you need assistance with meal preparation?: Yes Do you need assistance with eating?: No Do you have difficulty maintaining continence: Yes Do you need assistance with getting out of bed/getting out of a chair/moving?: Yes Do you have difficulty managing or taking your medications?: Yes  Follow up appointments reviewed: PCP Follow-up appointment confirmed?: No MD Provider Line Number:[phone] Given: Yes (Daughter will call PCP for an appt) Specialist Hospital Follow-up appointment confirmed?: No Reason Specialist Follow-Up Not Confirmed: Patient has Specialist Provider Number and will Call for Appointment (Patitint has called urologist and is waiting for a call back) Do you need transportation to your follow-up appointment?: No Do you understand care options if your condition(s) worsen?: Yes-patient verbalized understanding  SDOH Interventions Today    Flowsheet Row Most Recent Value  SDOH Interventions   Food Insecurity Interventions Intervention Not Indicated  Housing Interventions Intervention Not Indicated  Transportation Interventions Intervention Not Indicated, Patient Resources (Friends/Family)  Utilities Interventions Intervention Not Indicated   Discussed and offered  30 day TOC program.  Patient  declined.  The patient has been provided with contact information for the care management team and has been advised to call with any health -related questions or concerns.  The patient verbalized understanding with current plan of care.  The patient is directed to their insurance card regarding availability of benefits coverage   Cathlean Headland BSN RN [facility] Health Care Management Coordinator [email or web address] Direct Dial : [phone]  Fax: [phone] Website: [email or web address]

## 2024-05-01 ENCOUNTER — Encounter: Payer: Self-pay | Admitting: Neurology

## 2024-05-02 ENCOUNTER — Telehealth: Payer: Self-pay | Admitting: *Deleted

## 2024-05-02 ENCOUNTER — Encounter: Payer: Self-pay | Admitting: Family Medicine

## 2024-05-02 MED ORDER — LEVETIRACETAM 500 MG PO TABS: ORAL_TABLET | ORAL | 1 refills | Status: DC

## 2024-05-02 NOTE — Telephone Encounter (Signed)
 I can refill (though I do not manage seizure disorders and she will need to see neurology- sounds like that is arranged)  What pharmacy? [facility] would like to have a visit - can her version of medicare do a virtual or not ?  Also - I think I spoke to [place] about her case in the past- a program where a doctor could see her at home?  Is this still an option?   I am out of the office until monday

## 2024-05-02 NOTE — Addendum Note (Signed)
 Addended by: RANDEEN HARDY A on: 05/02/2024 04:06 PM   Modules accepted: Orders

## 2024-05-02 NOTE — Telephone Encounter (Signed)
 I sent the generic keppra   If we can get her set up with pcp for home visits no need for virtual  Thanks   Let me know how it goes   Thanks Rosina for trying to set this up

## 2024-05-02 NOTE — Telephone Encounter (Signed)
 Yes the home PCP is still an option.  I called them several times but was not ever able to connect.  [place] if you can ask them and they are agreeable I can talk with [facility].

## 2024-05-02 NOTE — Telephone Encounter (Signed)
 Forgot to route also to [place]

## 2024-05-02 NOTE — Telephone Encounter (Signed)
 Pt's family sent a message saying:  Hello,  Mom was started on Levtiracetam 500 mg daily and 1 tab after dialysis ( she does home HD 4x week). She only has a weeks worth left. Her hospital f/u with neuro isn't until 06/18/24. I reached out to their office for a refill , and was told that usually the primary can refill the med until she can be seen by their office since the seizure was a new issue for her. She hasn't had a follow up with your office as it is very hard to get her there, she was seen by the palliative NP since discharge. Would your office be able to send new script to [facility] in [place]? I don't know if y'all still do virtual visits, we can do that, and if she has to been seen in the office we can try our best to get her in there.    Thank you

## 2024-05-02 NOTE — Telephone Encounter (Signed)
 Spoke with daughter Clotilda addressed prev note.  Needs Rx sent to [facility] does want to proceed with at home PCP due to pt's overall status.   Per Rosina we can do a virtual visit until mid Jan and it's covered now however daughter wants to know if she still needs the virtual visit since we are setting her up with a at home PCP. She is okay if Dr. Randeen does want to still do a virtual visit but wants to know if needed since she will be getting new PCP  Will route to PCP and Rosina to f/u with with Daughter Clotilda [phone]

## 2024-05-03 ENCOUNTER — Other Ambulatory Visit: Payer: Self-pay | Admitting: *Deleted

## 2024-05-03 MED ORDER — LEVETIRACETAM 500 MG PO TABS: ORAL_TABLET | ORAL | 1 refills | Status: DC

## 2024-05-03 NOTE — Telephone Encounter (Signed)
 She has to take one daily  Then one after dialysis on m,w,f  I went ahead and phrased it as  One in am daily  One on m,w,f  at 6 pm  If the does NOT look right let me know You may have to double check with her family   Send pended prescription if that is correct   I am currently out of town and will be off and on computer Thanks

## 2024-05-03 NOTE — Telephone Encounter (Signed)
 Copied from CRM #[id number]. Topic: Clinical - Medication Question >> May 03, 2024  9:20 AM Antwanette L wrote: Reason for CRM: Bernarda from [facility] is calling to request clarification regarding the patient's prescription for levETIRAcetam  (Keppra ) 500 mg tablets. Please call the doctors line  at [phone]

## 2024-05-03 NOTE — Telephone Encounter (Signed)
 Please clarify directions sent to pharmacy, it says;  Take 1 tablet (500 mg total) by mouth daily AND 1 tablet (500 mg total) every Monday, Wednesday, and Friday at 6 PM.  ?? If it's suppose to be one additional table daily on those days, they need a new Rx sent with updated directions

## 2024-05-03 NOTE — Telephone Encounter (Signed)
Addressed through phone note 

## 2024-05-20 ENCOUNTER — Encounter (HOSPITAL_BASED_OUTPATIENT_CLINIC_OR_DEPARTMENT_OTHER): Attending: Internal Medicine | Admitting: Internal Medicine

## 2024-05-20 ENCOUNTER — Other Ambulatory Visit: Payer: Self-pay

## 2024-05-20 DIAGNOSIS — I87311 Chronic venous hypertension (idiopathic) with ulcer of right lower extremity: Secondary | ICD-10-CM | POA: Diagnosis not present

## 2024-05-20 DIAGNOSIS — L97812 Non-pressure chronic ulcer of other part of right lower leg with fat layer exposed: Secondary | ICD-10-CM | POA: Diagnosis not present

## 2024-05-20 DIAGNOSIS — E11622 Type 2 diabetes mellitus with other skin ulcer: Secondary | ICD-10-CM | POA: Diagnosis not present

## 2024-05-20 DIAGNOSIS — E1122 Type 2 diabetes mellitus with diabetic chronic kidney disease: Secondary | ICD-10-CM | POA: Diagnosis not present

## 2024-05-20 DIAGNOSIS — T798XXA Other early complications of trauma, initial encounter: Secondary | ICD-10-CM | POA: Diagnosis not present

## 2024-05-20 DIAGNOSIS — N186 End stage renal disease: Secondary | ICD-10-CM | POA: Insufficient documentation

## 2024-05-22 DIAGNOSIS — Z7401 Bed confinement status: Secondary | ICD-10-CM | POA: Insufficient documentation

## 2024-05-22 DIAGNOSIS — S81801A Unspecified open wound, right lower leg, initial encounter: Secondary | ICD-10-CM | POA: Insufficient documentation

## 2024-05-22 LAB — DERMATOLOGY PATHOLOGY

## 2024-05-28 NOTE — Progress Notes (Signed)
 Post stroke-- Right facial weakness and lagothalmus   Ulcer has healed and only a scar remains in OD.  No BCL present on exam. Recommend using artifical tears (gel versions) throughout the day to help with dryness and irritation.   The Chief Complaint, HPI, ROS and PFSHx as documented were reviewed and I Ananias S. Boehlke) agree as documented, or revised as indicated.  This note is written by MANUELITA DAWN, COA, OSC in the presence of and acting as the scribe of Dr. LONNI GORMAN FERRI, MD.

## 2024-06-11 ENCOUNTER — Encounter (HOSPITAL_BASED_OUTPATIENT_CLINIC_OR_DEPARTMENT_OTHER): Admitting: Internal Medicine

## 2024-06-18 ENCOUNTER — Ambulatory Visit (INDEPENDENT_AMBULATORY_CARE_PROVIDER_SITE_OTHER): Admitting: Neurology

## 2024-06-18 ENCOUNTER — Encounter: Payer: Self-pay | Admitting: Neurology

## 2024-06-18 DIAGNOSIS — F0150 Vascular dementia without behavioral disturbance: Secondary | ICD-10-CM

## 2024-06-18 DIAGNOSIS — I252 Old myocardial infarction: Secondary | ICD-10-CM | POA: Insufficient documentation

## 2024-06-18 DIAGNOSIS — Z8673 Personal history of transient ischemic attack (TIA), and cerebral infarction without residual deficits: Secondary | ICD-10-CM | POA: Diagnosis not present

## 2024-06-18 DIAGNOSIS — G40909 Epilepsy, unspecified, not intractable, without status epilepticus: Secondary | ICD-10-CM

## 2024-06-18 DIAGNOSIS — F411 Generalized anxiety disorder: Secondary | ICD-10-CM | POA: Insufficient documentation

## 2024-06-18 MED ORDER — LEVETIRACETAM 500 MG PO TABS: ORAL_TABLET | ORAL | 11 refills | Status: AC

## 2024-06-18 NOTE — Progress Notes (Signed)
 " [facility] [address] [place]. [place] [address] [phone]       OFFICE FOLLOW-UP VISIT NOTE  [name] Date of Birth:  04-Jun-1956 Medical Record Number:  [id number]   Referring MD:  Laine Bart  Reason for Referral: Hallucinations and memory loss  HPI: Initial visit 11/22/2022 Jennifer [name] is a 69 year old Caucasian lady seen today for office consultation visit hallucinations and memory loss.  She is accompanied by her daughter.  History is obtained from them and review of electronic medical records.  I personally reviewed pertinent available imaging films in PACS.  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  Patient was last seen in the office for stroke follow-up by Harlene nurse practitioner on 02/12/2019 then lost to follow-up.  Patient has had some cognitive deterioration for the last 6 to 7 months.  Family has noticed that she often talks to the parents.  During screening things like getting up at 3 in the morning and trying to cook breakfast and she has not cooked for years.  Confused and gets disoriented.  She often forgets the name of this.  With her husband and daughter.  She has not exhibited any behavior agitation.  Needs help with activities of daily living like taking a bath and changing her clothes.  She is never left alone more than a few hours at a time.  She walks with a walker for the last 8 months mostly because her left leg is swollen and heavy.  She has had a few falls.  He has not had any recent obvious stroke like symptoms.   She has end-stage renal disease and recently underwent AV fistula placement in anticipation for dialysis in the future.  She has not had any brain imaging studies.  Her MMSE: 28/30 PCPs office in February 2024  has declined today to 21/30.  CT head on 01/23/2022 had shown chronic white matter changes only. Update 06/22/2023 : She returns for follow-up after last visit 6 months ago.  She is accompanied by her daughter.  Patient was admitted to the hospital from 05/20/2023 to 05/24/2023 with a stroke.  She initially presented to [facility] with sudden onset of aphasia and right-sided weakness.  She was given IV TNK by telemetry neurologist and patient was transferred to [facility] where upon arrival she had significant worsening of her exam and stat head CT was performed which showed hemorrhagic transformation in the right pontine infarct.  TNK was reversed with TXA and cryoprecipitate.  She also had a small hematoma in the right upper arm Allises fistula but this was conservatively treated.  She was monitored closely in the ICU and blood pressure was tightly controlled.  She remained stable.  MRI scan showed punctate posterior left MCA subcortical white matter infarct acute right dorsal brainstem hemorrhage which was stable.  She with changes of chronic small vessel disease and extensive chronic microhemorrhages brain.  2D echo showed ejection fraction of 60 to 65%.  Lower extremity venous Dopplers were negative for DVT.  LDL cholesterol 102 mg percent and hemoglobin A1c was 6.2.  Patient was discharged home with home health and his family prefer that over inpatient rehab.  Patient still getting home health physical occupational therapy.  She can walk a little bit with 1 person assist but not  for long distances.  The daughter states that the physical deficits seems to be improving but cognitively she has worsened since her Namenda  dose was reduced from 10 mg twice daily to 5 mg twice daily when she left the hospital this time.  She is having hallucinations and seeing things people were not there.  She is living at home with her husband and 1 daughter.  Mini-Mental status exam today she scored 19/30. Update 06/18/2024 :  She returns for follow-up after last visit with me a year ago.  She is accompanied by her daughter.  She was admitted to [facility] on 04/06/2024 for evaluation for sudden onset of mental status change while doing dialysis at home with right gaze deviation.  She improved after Ativan  injection and she was thought to have possible seizure even though EEG as well as long-term EEG monitoring did not show definite seizures.  MRI scan was negative for acute infarct.  Patient was started on Keppra  500 mg daily and an extra dose on the dialysis days.  Patient seems to have done well since then her mental status has gradually recovered to her baseline.  She has had no further episodes of unresponsiveness or seizures.  Patient is now mostly nonambulatory.  She uses a wheelchair most of the time.  She cannot stand or pivot.  Physical decline began after her last set of strokes in June 2025.  She is living at home with her husband.  She needs a Hoyer lift to move her.  She continues to have memory and cognitive difficulties.  She has good and bad days.  At times she cannot recognize family's.  She is mostly confused and disoriented.  She is currently having palliative care nurse visits at home but is not full hospice care yet as she is still getting dialysis.  She remains on Namenda  which is tolerating well without side effects and Eliquis .  She does get easy bruising and today has a minor nosebleed which has stopped with local pressure in office ROS:   14 system review of systems is positive for hallucinations, disorientation, confusion, memory loss, poor balance, falls, bruising and all other systems negative  PMH:  Past Medical History:  Diagnosis Date   CAD (coronary artery disease)    cath 5/23 100% dist RCA lesion treated with 2 overlapping Integrity Resolute DES ( 2.25x42mm, 2.25x31mm), 60% mid RCA treated medically, 99% OM2 not amenable to PCI, 70% D1 lesion, EF normal   Hypercholesteremia     Hypertension    Hypothyroidism    Kidney disease    Myocardial infarction [facility])    Renal disorder    Stroke (HCC) 10/2018   Thyroid  disease    Type 2 diabetes mellitus (HCC) 10/08/2018    Social History:  Social History   Socioeconomic History   Marital status: Married    Spouse name: Not on file   Number of children: Not on file   Years of education: Not on file   Highest education level: Not on file  Occupational History   Not on file  Tobacco Use   Smoking status: Former    Current packs/day: 0.00    Average packs/day: 0.3 packs/day    Types: Cigarettes    Quit date: 05/13/2021    Years since quitting: 3.1   Smokeless tobacco: Never  Vaping Use   Vaping status: Never Used  Substance and Sexual Activity   Alcohol  use: No    Alcohol /week: 0.0 standard drinks of alcohol   Drug use: No   Sexual activity: Not Currently  Other Topics Concern   Not on file  Social History Narrative   Not on file   Social Drivers of Health   Tobacco Use: Medium Risk (06/18/2024)   Patient History    Smoking Tobacco Use: Former    Smokeless Tobacco Use: Never    Passive Exposure: Not on file  Financial Resource Strain: Low Risk (03/12/2024)   Overall Financial Resource Strain (CARDIA)    Difficulty of Paying Living Expenses: Not hard at all  Food Insecurity: No Food Insecurity (04/23/2024)   Epic    Worried About Programme Researcher, Broadcasting/film/video in the Last Year: Never true    Ran Out of Food in the Last Year: Never true  Transportation Needs: No Transportation Needs (04/23/2024)   Epic    Lack of Transportation (Medical): No    Lack of Transportation (Non-Medical): No  Physical Activity: Inactive (03/07/2023)   Exercise Vital Sign    Days of Exercise per Week: 0 days    Minutes of Exercise per Session: 0 min  Stress: Stress Concern Present (03/12/2024)   [employer] of Occupational Health - Occupational Stress Questionnaire    Feeling of Stress: Rather much  Social Connections:  Moderately Isolated (04/06/2024)   Social Connection and Isolation Panel    Frequency of Communication with Friends and Family: Never    Frequency of Social Gatherings with Friends and Family: More than three times a week    Attends Religious Services: Never    Database Administrator or Organizations: No    Attends Banker Meetings: Never    Marital Status: Married  Catering Manager Violence: Not At Risk (04/23/2024)   Epic    Fear of Current or Ex-Partner: No    Emotionally Abused: No    Physically Abused: No    Sexually Abused: No  Depression (PHQ2-9): Low Risk (03/12/2024)   Depression (PHQ2-9)    PHQ-2 Score: 1  Alcohol  Screen: Low Risk (03/07/2023)   Alcohol  Screen    Last Alcohol  Screening Score (AUDIT): 0  Housing: Low Risk (04/23/2024)   Epic    Unable to Pay for Housing in the Last Year: No    Number of Times Moved in the Last Year: 0    Homeless in the Last Year: No  Utilities: Not At Risk (04/23/2024)   Epic    Threatened with loss of utilities: No  Health Literacy: Patient Unable To Answer (03/12/2024)   B1300 Health Literacy    Frequency of need for help with medical instructions: Patient unable to respond    Medications:   Current Outpatient Medications on File Prior to Visit  Medication Sig Dispense Refill   acetaminophen  (TYLENOL ) 500 MG tablet Take 1,000 mg by mouth every 6 (six) hours as needed for mild pain (pain score 1-3).     amLODipine  (NORVASC ) 5 MG tablet Take 1 tablet by mouth daily.     apixaban  (ELIQUIS ) 5 MG TABS tablet Take 1 tablet (5 mg total) by mouth 2 (two) times daily. 180 tablet 1   atorvastatin  (LIPITOR ) 80 MG tablet TAKE 1 TABLET BY MOUTH ONCE DAILY AT  6  IN  THE  EVENING (Patient taking differently: Take 80 mg by mouth every evening. TAKE 1 TABLET BY MOUTH ONCE DAILY AT  6  IN  THE  EVENING) 90 tablet 3   carvedilol  (COREG ) 6.25 MG tablet Take 6.25 mg by mouth 2 (two) times daily with a meal.  DULoxetine  (CYMBALTA ) 60 MG  capsule Take 1 capsule (60 mg total) by mouth daily. 90 capsule 3   iron sucrose 100 mg in sodium chloride  0.9 % 100 mL Inject 100 mg into the vein every dialysis (low iron).     levETIRAcetam  (KEPPRA ) 500 MG tablet Take 1 tablet (500 mg total) by mouth daily in the morning AND 1 additional tablet every Monday, Wednesday, and Friday at 6 PM after dialysis. 42 tablet 1   levothyroxine  (SYNTHROID ) 175 MCG tablet Take 175 mcg by mouth daily before breakfast. (Patient taking differently: Take 175 mcg by mouth 3 (three) times a week.)     lidocaine -prilocaine  (EMLA ) cream Apply 1 Application topically daily as needed (HD).     memantine  (NAMENDA ) 10 MG tablet Take 1 tablet (10 mg total) by mouth 2 (two) times daily. 60 tablet 11   Methoxy PEG-Epoetin Beta (MIRCERA) 200 MCG/0.3ML SOSY 200 mcg by Implant route daily as needed (low RBC at dialysis). Mircera     OVER THE COUNTER MEDICATION Apply 1 application  topically See admin instructions. Zinc barrier cream- apply to buttocks with each incontinence episode.     nitroGLYCERIN  (NITROSTAT ) 0.4 MG SL tablet Place 0.4 mg under the tongue every 5 (five) minutes x 3 doses as needed for chest pain. (Patient not taking: Reported on 06/18/2024)     No current facility-administered medications on file prior to visit.    Allergies:   Allergies  Allergen Reactions   Tnkase  [Tenecteplase ] Other (See Comments)    Brain bleed   Glucophage [Metformin] Diarrhea    Physical Exam General: Mildly obese pleasant middle-aged Caucasian lady seated, in no evident distress Head: head normocephalic and atraumatic.   Neck: supple with no carotid or supraclavicular bruits Cardiovascular: regular rate and rhythm, no murmurs Musculoskeletal: no deformity Skin:  no rash/petichiae Vascular:  Normal pulses all extremities  Neurologic Exam Mental Status: Awake and fully alert. Oriented to place and time. Recent and remote memory are poor. Attention span, concentration and  fund of knowledge diminished. Mood and affect appropriate.  Poor recall 0/3.  Able to name only 11 animals which can walk on 4 legs.  Clock drawing 2/4.  MMSE not done today.  Last visit score 19/30 Cranial Nerves: Fundoscopic exam not done. Pupils equal, briskly reactive to light. Extraocular movements full without nystagmus. Visual fields full to confrontation. Hearing intact. Facial sensation intact.  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  Tongue, palate moves normally and symmetrically.  Motor: Mild spastic left hemiparesis 4/5 strength with weakness of left grip and intrinsic hand muscles and left hip flexors and ankle dorsiflexors.  Tone is increased on the left.  Normal strength on the right. Sensory.:  Diminished left hemibody sensation to touch , pinprick , position and vibratory sensation.  Coordination: Rapid alternating movements normal in all extremities. Finger-to-nose and heel-to-shin performed accurately bilaterally. Gait and Station: Deferred as patient is unable to walk Reflexes: 1+ and symmetric. Toes downgoing.    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  Prior history of left corpus callosum lacunar infarct in 2020 and  old right basal ganglia infarct in 2019.  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  Recommend mom duration based on this         PLAN:  I had a long discussion with the patient and her daughter regarding her recent seizure-like episode and prior multiple strokes and vascular and Alzheimer's dementia and answered questions.  Recommend she continue Keppra  and the current dosage of 5 mg once a day and an additional dose dialysis days for seizure prevention.  Eliquis  for stroke prevention with aggressive risk factor modification with strict control of hypertension with blood pressure goal below 130/90, lipids with LDL cholesterol goal below milligrams percent and diabetes with hemoglobin A1c goal below 6.5%.  Continue Namenda  10 mg twice daily for dementia.  Continue palliative care support at home.  Return for follow-up in the future only as needed   I personally spent a total of  35 minutes in the care of the patient today including getting/reviewing separately obtained history, performing a medically appropriate exam/evaluation, counseling and educating, placing orders, referring and communicating with other health care professionals, documenting clinical information in the EHR, independently interpreting results, and coordinating care.        Eather Popp, MD Note: This document was prepared with digital dictation and possible smart phrase technology. Any transcriptional errors that result from this process are unintentional.   "

## 2024-06-18 NOTE — Patient Instructions (Signed)
 I had a long discussion with the patient and her daughter regarding her recent seizure-like episode and prior multiple strokes and vascular and Alzheimer's dementia and answered questions.  Recommend she continue Keppra  and the current dosage of 5 mg once a day and an additional dose dialysis days for seizure prevention.  Eliquis  for stroke prevention with aggressive risk factor modification with strict control of hypertension with blood pressure goal below 130/90, lipids with LDL cholesterol goal below milligrams percent and diabetes with hemoglobin A1c goal below 6.5%.  Continue Namenda  10 mg twice daily for dementia.  Continue palliative care support at home.  Return for follow-up in the future only as needed

## 2024-09-04 ENCOUNTER — Ambulatory Visit: Admitting: Internal Medicine

## 2025-03-13 ENCOUNTER — Ambulatory Visit

## 2025-03-14 ENCOUNTER — Ambulatory Visit
